# Patient Record
Sex: Female | Born: 1985 | Race: White | Hispanic: No | Marital: Single
Health system: Southern US, Community
[De-identification: ages and names within clinical notes are randomized; demographics above are authoritative.]

## PROBLEM LIST (undated history)

## (undated) DIAGNOSIS — K439 Ventral hernia without obstruction or gangrene: Secondary | ICD-10-CM

## (undated) DIAGNOSIS — F99 Mental disorder, not otherwise specified: Secondary | ICD-10-CM

## (undated) DIAGNOSIS — IMO0001 Reserved for inherently not codable concepts without codable children: Secondary | ICD-10-CM

## (undated) DIAGNOSIS — I10 Essential (primary) hypertension: Secondary | ICD-10-CM

## (undated) DIAGNOSIS — F32A Depression, unspecified: Secondary | ICD-10-CM

## (undated) DIAGNOSIS — E785 Hyperlipidemia, unspecified: Secondary | ICD-10-CM

## (undated) DIAGNOSIS — I739 Peripheral vascular disease, unspecified: Secondary | ICD-10-CM

## (undated) DIAGNOSIS — F319 Bipolar disorder, unspecified: Secondary | ICD-10-CM

## (undated) DIAGNOSIS — E119 Type 2 diabetes mellitus without complications: Secondary | ICD-10-CM

## (undated) DIAGNOSIS — F419 Anxiety disorder, unspecified: Secondary | ICD-10-CM

## (undated) DIAGNOSIS — F259 Schizoaffective disorder, unspecified: Secondary | ICD-10-CM

## (undated) DIAGNOSIS — C44509 Unspecified malignant neoplasm of skin of other part of trunk: Secondary | ICD-10-CM

## (undated) DIAGNOSIS — J45909 Unspecified asthma, uncomplicated: Secondary | ICD-10-CM

## (undated) DIAGNOSIS — E282 Polycystic ovarian syndrome: Secondary | ICD-10-CM

## (undated) DIAGNOSIS — T50902A Poisoning by unspecified drugs, medicaments and biological substances, intentional self-harm, initial encounter: Secondary | ICD-10-CM

## (undated) DIAGNOSIS — C499 Malignant neoplasm of connective and soft tissue, unspecified: Secondary | ICD-10-CM

## (undated) DIAGNOSIS — E669 Obesity, unspecified: Secondary | ICD-10-CM

## (undated) DIAGNOSIS — R569 Unspecified convulsions: Secondary | ICD-10-CM

## (undated) DIAGNOSIS — F209 Schizophrenia, unspecified: Secondary | ICD-10-CM

## (undated) DIAGNOSIS — E7800 Pure hypercholesterolemia, unspecified: Secondary | ICD-10-CM

## (undated) DIAGNOSIS — F329 Major depressive disorder, single episode, unspecified: Secondary | ICD-10-CM

## (undated) HISTORY — DX: Hyperlipidemia, unspecified: E78.5

## (undated) HISTORY — DX: Peripheral vascular disease, unspecified: I73.9

## (undated) HISTORY — DX: Essential (primary) hypertension: I10

## (undated) HISTORY — DX: Type 2 diabetes mellitus without complications: E11.9

## (undated) HISTORY — DX: Depression, unspecified: F32.A

## (undated) HISTORY — DX: Anxiety disorder, unspecified: F41.9

## (undated) HISTORY — DX: Schizoaffective disorder, unspecified: F25.9

## (undated) HISTORY — DX: Bipolar disorder, unspecified: F31.9

## (undated) HISTORY — PX: OTHER SURGICAL HISTORY: SHX169

## (undated) HISTORY — PX: CHOLECYSTECTOMY: SHX55

## (undated) HISTORY — PX: HERNIA REPAIR: SHX51

## (undated) HISTORY — PX: VARICOSE VEIN SURGERY: SHX832

---
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
## 2006-02-28 ENCOUNTER — Encounter: Payer: Self-pay | Admitting: Family Medicine

## 2006-02-28 ENCOUNTER — Ambulatory Visit: Payer: Self-pay | Admitting: Family Medicine

## 2006-04-09 ENCOUNTER — Telehealth: Payer: Self-pay | Admitting: *Deleted

## 2006-04-09 ENCOUNTER — Ambulatory Visit: Payer: Self-pay | Admitting: Family Medicine

## 2006-04-09 LAB — CONVERTED CEMR LAB

## 2006-04-20 ENCOUNTER — Ambulatory Visit: Payer: Self-pay | Admitting: Family Medicine

## 2006-04-20 ENCOUNTER — Encounter (INDEPENDENT_AMBULATORY_CARE_PROVIDER_SITE_OTHER): Payer: Self-pay | Admitting: Family Medicine

## 2006-04-20 DIAGNOSIS — L708 Other acne: Secondary | ICD-10-CM | POA: Insufficient documentation

## 2006-04-20 DIAGNOSIS — N912 Amenorrhea, unspecified: Secondary | ICD-10-CM | POA: Insufficient documentation

## 2006-04-20 DIAGNOSIS — R109 Unspecified abdominal pain: Secondary | ICD-10-CM

## 2006-04-20 LAB — CONVERTED CEMR LAB
TSH: 2.8 microintl units/mL (ref 0.350–5.50)
hCG, Beta Chain, Quant, S: <2 milliintl units/mL

## 2006-04-24 ENCOUNTER — Encounter: Admission: RE | Admit: 2006-04-24 | Discharge: 2006-04-24 | Payer: Self-pay | Admitting: Sports Medicine

## 2006-04-24 ENCOUNTER — Encounter (INDEPENDENT_AMBULATORY_CARE_PROVIDER_SITE_OTHER): Payer: Self-pay | Admitting: Family Medicine

## 2006-04-25 ENCOUNTER — Telehealth (INDEPENDENT_AMBULATORY_CARE_PROVIDER_SITE_OTHER): Payer: Self-pay | Admitting: *Deleted

## 2006-04-25 ENCOUNTER — Encounter (INDEPENDENT_AMBULATORY_CARE_PROVIDER_SITE_OTHER): Payer: Self-pay | Admitting: *Deleted

## 2006-04-25 ENCOUNTER — Encounter (INDEPENDENT_AMBULATORY_CARE_PROVIDER_SITE_OTHER): Payer: Self-pay | Admitting: Family Medicine

## 2006-04-25 DIAGNOSIS — E282 Polycystic ovarian syndrome: Secondary | ICD-10-CM

## 2006-04-26 ENCOUNTER — Ambulatory Visit: Payer: Self-pay | Admitting: Sports Medicine

## 2006-04-26 ENCOUNTER — Encounter (INDEPENDENT_AMBULATORY_CARE_PROVIDER_SITE_OTHER): Payer: Self-pay | Admitting: Family Medicine

## 2006-04-26 ENCOUNTER — Encounter: Payer: Self-pay | Admitting: *Deleted

## 2006-04-26 LAB — CONVERTED CEMR LAB
Glucose, Bld: 78 mg/dL (ref 70–99)
HDL: 27 mg/dL — ABNORMAL LOW (ref >39)

## 2006-04-27 ENCOUNTER — Telehealth (INDEPENDENT_AMBULATORY_CARE_PROVIDER_SITE_OTHER): Payer: Self-pay | Admitting: Family Medicine

## 2006-04-27 ENCOUNTER — Encounter (INDEPENDENT_AMBULATORY_CARE_PROVIDER_SITE_OTHER): Payer: Self-pay | Admitting: Family Medicine

## 2006-06-13 ENCOUNTER — Telehealth: Payer: Self-pay | Admitting: *Deleted

## 2006-06-13 ENCOUNTER — Ambulatory Visit: Payer: Self-pay | Admitting: Family Medicine

## 2006-06-13 ENCOUNTER — Encounter (INDEPENDENT_AMBULATORY_CARE_PROVIDER_SITE_OTHER): Payer: Self-pay | Admitting: Family Medicine

## 2006-06-13 DIAGNOSIS — N921 Excessive and frequent menstruation with irregular cycle: Secondary | ICD-10-CM

## 2006-06-13 DIAGNOSIS — R42 Dizziness and giddiness: Secondary | ICD-10-CM

## 2006-06-13 DIAGNOSIS — N949 Unspecified condition associated with female genital organs and menstrual cycle: Secondary | ICD-10-CM

## 2006-06-13 LAB — CONVERTED CEMR LAB
GC Probe Amp, Genital: NEGATIVE
Hemoglobin: 14.3 g/dL
MCV: 86 fL
Platelets: 264 10*3/uL
Prolactin: 10.9 ng/mL
Testosterone: 55.8 ng/dL — ABNORMAL HIGH (ref 15–40)
WBC: 9.7 10*3/uL
Whiff Test: NEGATIVE
hCG, Beta Chain, Quant, S: <2 milliintl units/mL

## 2006-06-15 ENCOUNTER — Encounter: Admission: RE | Admit: 2006-06-15 | Discharge: 2006-06-15 | Payer: Self-pay | Admitting: Sports Medicine

## 2006-06-19 ENCOUNTER — Telehealth (INDEPENDENT_AMBULATORY_CARE_PROVIDER_SITE_OTHER): Payer: Self-pay | Admitting: Family Medicine

## 2006-08-16 ENCOUNTER — Encounter: Payer: Self-pay | Admitting: Family Medicine

## 2006-10-01 ENCOUNTER — Encounter: Payer: Self-pay | Admitting: Family Medicine

## 2006-11-21 ENCOUNTER — Encounter: Payer: Self-pay | Admitting: Family Medicine

## 2006-11-21 ENCOUNTER — Ambulatory Visit: Payer: Self-pay | Admitting: Family Medicine

## 2006-11-21 DIAGNOSIS — R030 Elevated blood-pressure reading, without diagnosis of hypertension: Secondary | ICD-10-CM | POA: Insufficient documentation

## 2006-11-21 DIAGNOSIS — R5381 Other malaise: Secondary | ICD-10-CM | POA: Insufficient documentation

## 2006-11-21 DIAGNOSIS — R5383 Other fatigue: Secondary | ICD-10-CM

## 2006-11-21 LAB — CONVERTED CEMR LAB
AST: 39 units/L — ABNORMAL HIGH (ref 0–37)
Albumin: 4.1 g/dL (ref 3.5–5.2)
Alkaline Phosphatase: 81 units/L (ref 39–117)
BUN: 9 mg/dL (ref 6–23)
Basophils Relative: 0 % (ref 0–1)
Calcium: 9 mg/dL (ref 8.4–10.5)
Chloride: 103 meq/L (ref 96–112)
Creatinine, Ser: 0.61 mg/dL (ref 0.40–1.20)
Eosinophils Absolute: 0.2 10*3/uL (ref 0.0–0.7)
Free T4: 0.92 ng/dL (ref 0.89–1.80)
Glucose, Bld: 82 mg/dL (ref 70–99)
Glucose, Urine, Semiquant: NEGATIVE
Hemoglobin: 13.4 g/dL (ref 12.0–15.0)
Lymphs Abs: 2.8 10*3/uL (ref 0.7–3.3)
MCHC: 32.2 g/dL (ref 30.0–36.0)
MCV: 86.5 fL (ref 78.0–100.0)
Monocytes Absolute: 0.5 10*3/uL (ref 0.2–0.7)
Monocytes Relative: 5 % (ref 3–11)
RBC: 4.81 M/uL (ref 3.87–5.11)
Specific Gravity, Urine: 1.025
WBC: 9.9 10*3/uL (ref 4.0–10.5)
pH: 6

## 2006-11-26 ENCOUNTER — Telehealth: Payer: Self-pay | Admitting: Family Medicine

## 2006-12-10 ENCOUNTER — Encounter (INDEPENDENT_AMBULATORY_CARE_PROVIDER_SITE_OTHER): Payer: Self-pay | Admitting: *Deleted

## 2006-12-17 ENCOUNTER — Ambulatory Visit: Payer: Self-pay | Admitting: Family Medicine

## 2006-12-17 ENCOUNTER — Encounter: Payer: Self-pay | Admitting: Family Medicine

## 2006-12-24 ENCOUNTER — Telehealth: Payer: Self-pay | Admitting: Family Medicine

## 2007-01-10 ENCOUNTER — Encounter (INDEPENDENT_AMBULATORY_CARE_PROVIDER_SITE_OTHER): Payer: Self-pay | Admitting: *Deleted

## 2007-01-10 ENCOUNTER — Ambulatory Visit: Payer: Self-pay | Admitting: Family Medicine

## 2007-07-30 ENCOUNTER — Ambulatory Visit: Payer: Self-pay | Admitting: Family Medicine

## 2007-07-30 DIAGNOSIS — E669 Obesity, unspecified: Secondary | ICD-10-CM | POA: Insufficient documentation

## 2007-07-30 DIAGNOSIS — F251 Schizoaffective disorder, depressive type: Secondary | ICD-10-CM

## 2007-07-30 DIAGNOSIS — G479 Sleep disorder, unspecified: Secondary | ICD-10-CM | POA: Insufficient documentation

## 2007-07-30 DIAGNOSIS — R7301 Impaired fasting glucose: Secondary | ICD-10-CM | POA: Insufficient documentation

## 2007-07-30 DIAGNOSIS — E66812 Obesity, class 2: Secondary | ICD-10-CM | POA: Insufficient documentation

## 2007-08-02 ENCOUNTER — Ambulatory Visit (HOSPITAL_BASED_OUTPATIENT_CLINIC_OR_DEPARTMENT_OTHER): Admission: RE | Admit: 2007-08-02 | Discharge: 2007-08-02 | Payer: Self-pay | Admitting: Family Medicine

## 2007-08-02 ENCOUNTER — Encounter: Payer: Self-pay | Admitting: Family Medicine

## 2007-08-06 ENCOUNTER — Telehealth: Payer: Self-pay | Admitting: *Deleted

## 2007-08-10 ENCOUNTER — Ambulatory Visit: Payer: Self-pay | Admitting: Internal Medicine

## 2007-08-19 ENCOUNTER — Telehealth: Payer: Self-pay | Admitting: *Deleted

## 2007-09-23 ENCOUNTER — Encounter: Payer: Self-pay | Admitting: Family Medicine

## 2007-09-23 ENCOUNTER — Ambulatory Visit: Payer: Self-pay | Admitting: Family Medicine

## 2007-09-23 DIAGNOSIS — F909 Attention-deficit hyperactivity disorder, unspecified type: Secondary | ICD-10-CM | POA: Insufficient documentation

## 2007-09-23 DIAGNOSIS — R1013 Epigastric pain: Secondary | ICD-10-CM

## 2007-09-23 LAB — CONVERTED CEMR LAB
ALT: 29 units/L (ref 0–35)
AST: 50 units/L — ABNORMAL HIGH (ref 0–37)
Albumin: 4.4 g/dL (ref 3.5–5.2)
Alkaline Phosphatase: 99 units/L (ref 39–117)
BUN: 8 mg/dL (ref 6–23)
Calcium: 9.6 mg/dL (ref 8.4–10.5)
Chloride: 104 meq/L (ref 96–112)
H Pylori IgG: NEGATIVE
Hemoglobin: 13.5 g/dL (ref 12.0–15.0)
Hgb A1c MFr Bld: 5.7 %
MCHC: 32.7 g/dL (ref 30.0–36.0)
Potassium: 4.2 meq/L (ref 3.5–5.3)
RDW: 14.2 % (ref 11.5–15.5)
Sodium: 141 meq/L (ref 135–145)
Total Protein: 7.5 g/dL (ref 6.0–8.3)

## 2007-09-24 ENCOUNTER — Encounter: Payer: Self-pay | Admitting: Family Medicine

## 2007-10-16 ENCOUNTER — Telehealth: Payer: Self-pay | Admitting: *Deleted

## 2007-10-16 ENCOUNTER — Ambulatory Visit: Payer: Self-pay | Admitting: Family Medicine

## 2007-10-16 LAB — CONVERTED CEMR LAB
Beta hcg, urine, semiquantitative: NEGATIVE
Ketones, urine, test strip: NEGATIVE
Nitrite: NEGATIVE
Urobilinogen, UA: 0.2
WBC Urine, dipstick: NEGATIVE

## 2007-10-17 ENCOUNTER — Emergency Department (HOSPITAL_COMMUNITY): Admission: EM | Admit: 2007-10-17 | Discharge: 2007-10-18 | Payer: Self-pay | Admitting: Emergency Medicine

## 2007-10-17 ENCOUNTER — Telehealth: Payer: Self-pay | Admitting: *Deleted

## 2007-10-17 ENCOUNTER — Telehealth (INDEPENDENT_AMBULATORY_CARE_PROVIDER_SITE_OTHER): Payer: Self-pay | Admitting: Family Medicine

## 2007-10-18 ENCOUNTER — Telehealth: Payer: Self-pay | Admitting: *Deleted

## 2007-10-22 ENCOUNTER — Encounter: Payer: Self-pay | Admitting: Family Medicine

## 2007-10-22 ENCOUNTER — Encounter: Payer: Self-pay | Admitting: *Deleted

## 2007-10-30 ENCOUNTER — Telehealth (INDEPENDENT_AMBULATORY_CARE_PROVIDER_SITE_OTHER): Payer: Self-pay | Admitting: *Deleted

## 2007-10-30 ENCOUNTER — Telehealth: Payer: Self-pay | Admitting: *Deleted

## 2008-01-08 ENCOUNTER — Encounter: Payer: Self-pay | Admitting: Family Medicine

## 2010-02-14 ENCOUNTER — Encounter: Payer: Self-pay | Admitting: Emergency Medicine

## 2010-06-07 NOTE — Procedures (Signed)
NAME:  Tina Saunders, Tina Saunders                ACCOUNT NO.:  [id number]   MEDICAL RECORD NO.:  [id number]          PATIENT TYPE:  OUT   LOCATION:  SLEEP CENTER                 FACILITY:  [facility]   PHYSICIAN:  Clinton D. Maple Hudson, MD, FCCP, FACPDATE OF BIRTH:  04/21/1986   DATE OF STUDY:  08/02/2007                            NOCTURNAL POLYSOMNOGRAM   REFERRING PHYSICIAN:   REFERRING PHYSICIAN:  Ancil Boozer, M.D.   INDICATION FOR STUDY:  Insomnia with sleep apnea.   EPWORTH SLEEPINESS SCORE:  14/24.  BMI 42.1.  Weight 230 pounds.  Height  62 inches.  Neck 16 inches.   HOME MEDICATIONS:  Charted and reviewed.   SLEEP ARCHITECTURE:  Total sleep time 231.5 minutes with sleep  efficiency 60.5%.  Stage 1 was 14.5%.  Stage II 73.7%.  Stage III 11.9%.  REM absent.  Sleep latency 85 minutes.  REM latency N/A.  Awake after  sleep onset 63.5 minutes.  Arousal index 31.6.  Bedtime medication  included Metformin and Permetrim.   RESPIRATORY DATA:  Apnea-hypopnea index (AHI) 6.5 per hour.  There were  25 events scored, including 19 central apneas and 6 hypopneas.   OXYGEN DATA:  Moderately loud snoring with oxygen desaturation to a  nadir of 86%.  Mean oxygen saturation through the study was 95.8% on  room air.   CARDIAC DATA:  Normal sinus rhythm.   MOVEMENT-PARASOMNIA:  No significant movement disturbance.  Bathroom x1.   IMPRESSIONS-RECOMMENDATIONS:  1. Sleep architecture marked by delay in sleep onset, latency 85      minutes, with frequent subsequent awakenings.  2. Mild central and obstructive sleep apnea, mainly central events,      apnea-hypopnea index 6.5 per hour.  Events were all associated with      supine sleep position.  3. This pattern is not entirely clear because snoring was associated      with the events, but obvious obstruction was      not seen.  A therapeutic trial of CPAP could be considered but      conservative therapy may be appropriate, including weight loss, and  encouragement to sleep on flat of back.      Clinton D. Maple Hudson, MD, [facility], FACP  Diplomate, Biomedical engineer of Sleep Medicine  Electronically Signed     CDY/MEDQ  D:  08/10/2007 09:51:28  T:  08/10/2007 10:35:59  Job:  [id number]

## 2010-06-10 NOTE — Op Note (Signed)
NAMELARONICA, BHAGAT                            ACCOUNT NO.:  [id number]   MEDICAL RECORD NO.:  [id number]                   PATIENT TYPE:  OIB   LOCATION:                                       FACILITY:  [facility]   PHYSICIAN:  Quita Skye. Hart Rochester, M.D.               DATE OF BIRTH:  09-May-1985   DATE OF PROCEDURE:  03/28/2002  DATE OF DISCHARGE:                                 OPERATIVE REPORT   PREOPERATIVE DIAGNOSIS:  Severe varicose veins, greater saphenous system,  left leg.   POSTOPERATIVE DIAGNOSIS:  Severe varicose veins, greater saphenous system,  left leg.   OPERATION:  1. Ligation and stripping of greater saphenous vein, left leg.  2. Excision of multiple varicosities, left leg.   SURGEON:  Quita Skye. Hart Rochester, M.D.   FIRST ASSISTANT:  Lissa Merlin, P.A.   ANESTHESIA:  General endotracheal.   DESCRIPTION OF PROCEDURE:  The patient was taken to the operating room,  placed in the upright position where the varicosities of the left leg were  marked.  These involved the greater saphenous system beginning in the distal  thigh and extending into the calf, both anteriorly and posteriorly.  After  induction of satisfactory general endotracheal anesthesia, the left leg was  prepped with Betadine solution and draped in a routine sterile manner.  Incision was made in the inguinal region obliquely, carried down to the  saphenofemoral junction.  Saphenofemoral junction dissected free.  All  branches ligated with 3-0 silk ties and divided.  Saphenous vein was ligated  at the saphenofemoral junction with a 2-0 tie, opened transversely, and  intraluminal stripper was passed distally to the proximal calf where it was  palpated.  A short transverse incision was made, and the vein ligated  distally, transected, and a small stripper head secured in the groin area.  Short stab wound incisions were then made in transverse fashion over the  marked varicosities, and all of the varicosities were  removed by either  dissection or avulsion techniques.  These extended both anteriorly and  posteriorly into the calf and pretibial region, requiring about 10  incisions.  When this was completed, the leg was wrapped, and the patient  was placed in Trendelenburg position.  The greater saphenous veins stripped  from proximal to distal.  Compression applied for 10 minutes.  The wounds  were then all closed with Vicryl in subcuticular fashion with Steri-Strips.  Sterile dressing applied consisting of 4 x 4's, Webril, and Ace wrap.  The  patient was taken to the recovery room in satisfactory condition.                                               Quita Skye Hart Rochester, M.D.    JDL/MEDQ  D:  03/28/2002  T:  03/28/2002  Job:  [id number]

## 2010-10-24 LAB — URINALYSIS, ROUTINE W REFLEX MICROSCOPIC
Bilirubin Urine: NEGATIVE
Hgb urine dipstick: NEGATIVE
Specific Gravity, Urine: 1.012
Urobilinogen, UA: 1
pH: 7

## 2010-10-24 LAB — WET PREP, GENITAL
Trich, Wet Prep: NONE SEEN
Yeast Wet Prep HPF POC: NONE SEEN

## 2010-10-24 LAB — PREGNANCY, URINE: Preg Test, Ur: NEGATIVE

## 2011-06-29 ENCOUNTER — Encounter (HOSPITAL_COMMUNITY): Payer: Self-pay | Admitting: Emergency Medicine

## 2011-06-29 ENCOUNTER — Emergency Department (HOSPITAL_COMMUNITY)
Admission: EM | Admit: 2011-06-29 | Discharge: 2011-06-30 | Disposition: A | Discharge status: Home / Self Care | Payer: Self-pay | Attending: Emergency Medicine | Admitting: Emergency Medicine

## 2011-06-29 DIAGNOSIS — R112 Nausea with vomiting, unspecified: Secondary | ICD-10-CM | POA: Insufficient documentation

## 2011-06-29 DIAGNOSIS — R197 Diarrhea, unspecified: Secondary | ICD-10-CM | POA: Insufficient documentation

## 2011-06-29 DIAGNOSIS — Z85028 Personal history of other malignant neoplasm of stomach: Secondary | ICD-10-CM | POA: Insufficient documentation

## 2011-06-29 HISTORY — DX: Unspecified malignant neoplasm of skin of other part of trunk: C44.509

## 2011-06-29 MED ORDER — SODIUM CHLORIDE 0.9 % IV BOLUS (SEPSIS)
1000.0000 mL | Freq: Once | INTRAVENOUS | Status: AC
  Administered 2011-06-30: 1000 mL via INTRAVENOUS

## 2011-06-29 MED ORDER — ONDANSETRON HCL 4 MG/2ML IJ SOLN
4.0000 mg | Freq: Once | INTRAMUSCULAR | Status: AC
  Administered 2011-06-30: 4 mg via INTRAVENOUS
  Filled 2011-06-29: qty 2

## 2011-06-29 MED ORDER — MORPHINE SULFATE 4 MG/ML IJ SOLN
4.0000 mg | Freq: Once | INTRAMUSCULAR | Status: AC
  Administered 2011-06-30: 4 mg via INTRAVENOUS
  Filled 2011-06-29: qty 1

## 2011-06-29 NOTE — ED Provider Notes (Signed)
History     CSN: [id number]  Arrival date & time 06/29/11  2331   First MD Initiated Contact with Patient 06/29/11 2341      Chief Complaint  Patient presents with  . 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 Vomiting is nonbloody and nonbilious. Diarrhea is watery. No bloody or black tarry stools. Patient has been taking Zofran at home without relief. She has had epigastric abdominal pain since onset of symptoms. Pain is described as a squeezing. Patient denies fevers or dysuria. She states that she has not been urinating as much is normal. She states that she is unable to keep down any fluids or solids. She has not been seen for these symptoms in [place].   Patient is a 26 y.o. female presenting with abdominal pain. The history is provided by the patient.  Abdominal Pain The primary symptoms of the illness include abdominal pain, nausea, vomiting and diarrhea. The primary symptoms of the illness do not include fever, shortness of breath, hematemesis or dysuria. The current episode started yesterday. The onset of the illness was gradual. The problem has not changed since onset.   Past Medical History  Diagnosis Date  . Cancer of abdominal wall     Past Surgical History  Procedure Date  . Cholecystectomy     No family history on file.  History  Substance Use Topics  . Smoking status: Not on file  . Smokeless tobacco: Not on file  . Alcohol Use:     OB History    Grav Para Term Preterm Abortions TAB SAB Ect Mult Living                  Review of Systems  Constitutional: Negative for fever.  HENT: Negative for sore throat and rhinorrhea.   Eyes: Negative for  redness.  Respiratory: Negative for cough and shortness of breath.   Cardiovascular: Negative for chest pain.  Gastrointestinal: Positive for nausea, vomiting, abdominal pain and diarrhea. Negative for hematemesis.  Genitourinary: Positive for decreased urine volume. Negative for dysuria.  Musculoskeletal: Negative for myalgias.  Skin: Negative for rash.  Neurological: Negative for headaches.    Allergies  Review of patient's allergies indicates no known allergies.  Home Medications  No current outpatient prescriptions on file.  BP 149/98  Pulse 92  Temp(Src) 99 F (37.2 C) (Oral)  Resp 20  SpO2 98%  LMP 06/21/2011  Physical Exam  Nursing note and vitals reviewed. Constitutional: She appears well-developed and well-nourished.  HENT:  Head: Normocephalic and atraumatic.  Eyes: Conjunctivae are normal. Right eye exhibits no discharge. Left eye exhibits no discharge.  Neck: Normal range of motion. Neck supple.  Cardiovascular: Normal rate, regular rhythm and normal heart sounds.   Pulmonary/Chest: Effort normal and breath sounds normal.  Abdominal: Soft. There is tenderness in the epigastric area and left upper quadrant. There is no rigidity, no rebound, no guarding, no tenderness at McBurney's point and negative Murphy's sign.    Neurological: She is alert.  Skin: Skin is warm and dry.  Psychiatric: She has a normal mood and affect.    ED Course  Procedures (including critical care time)  Labs Reviewed  URINALYSIS, ROUTINE W REFLEX MICROSCOPIC - Abnormal; Notable for the following:    APPearance CLOUDY (*)  Hgb urine dipstick TRACE (*)    Leukocytes, UA MODERATE (*)    All other components within normal limits  CBC - Abnormal; Notable for the following:    WBC 10.7 (*)    All other components within normal limits  COMPREHENSIVE METABOLIC PANEL - Abnormal; Notable for the following:    Glucose, Bld 123 (*)    Albumin 3.4 (*)    AST 38 (*)    Total Bilirubin 0.1  (*)    All other components within normal limits  URINE MICROSCOPIC-ADD ON - Abnormal; Notable for the following:    Squamous Epithelial / LPF MANY (*)    Bacteria, UA FEW (*)    All other components within normal limits  PREGNANCY, URINE  DIFFERENTIAL  LIPASE, BLOOD   No results found.   1. Nausea vomiting and diarrhea   2. Abdominal pain     11:52 PM Patient seen and examined. Work-up initiated. Medications ordered.   Vital signs reviewed and are as follows: Filed Vitals:   06/29/11 2333  BP: 149/98  Pulse: 92  Temp: 99 F (37.2 C)  Resp: 20   1:19 AM Lab work-up is unconcerning. Abd exam remains benign. Will give PO challenge. Handoff to Dr. Patria Mane who will dispo appropriately after PO challenge.    MDM  Abd pain, N/V/D -- patient does not appear clinically dehydrated and labs support this. Work-up without any concerning findings. Abd soft. Do not suspect obstruction given rectal output. PO challenge pending.         Renne Crigler, [place] 06/30/11 [phone]

## 2011-06-29 NOTE — ED Notes (Signed)
PT. REPORTS EMESIS WITH DIARRHEA ND MID ABDOMINAL PAIN ONSET YESTERDAY , NO FEVER OR CHILLS.

## 2011-06-30 LAB — COMPREHENSIVE METABOLIC PANEL
BUN: 9 mg/dL (ref 6–23)
Calcium: 9.2 mg/dL (ref 8.4–10.5)
Creatinine, Ser: 0.52 mg/dL (ref 0.50–1.10)
GFR calc Af Amer: >90 mL/min (ref >90)
Glucose, Bld: 123 mg/dL — ABNORMAL HIGH (ref 70–99)
Total Protein: 7.3 g/dL (ref 6.0–8.3)

## 2011-06-30 LAB — URINALYSIS, ROUTINE W REFLEX MICROSCOPIC
Bilirubin Urine: NEGATIVE
Glucose, UA: NEGATIVE mg/dL
Ketones, ur: NEGATIVE mg/dL
Nitrite: NEGATIVE
Protein, ur: NEGATIVE mg/dL
pH: 5.5 (ref 5.0–8.0)

## 2011-06-30 LAB — URINE MICROSCOPIC-ADD ON

## 2011-06-30 LAB — CBC
Hemoglobin: 13.2 g/dL (ref 12.0–15.0)
MCH: 28.8 pg (ref 26.0–34.0)
MCHC: 34 g/dL (ref 30.0–36.0)
MCV: 84.5 fL (ref 78.0–100.0)
RBC: 4.59 MIL/uL (ref 3.87–5.11)

## 2011-06-30 LAB — DIFFERENTIAL
Eosinophils Absolute: 0.5 10*3/uL (ref 0.0–0.7)
Eosinophils Relative: 5 % (ref 0–5)
Lymphs Abs: 2.5 10*3/uL (ref 0.7–4.0)
Monocytes Relative: 5 % (ref 3–12)

## 2011-06-30 LAB — LIPASE, BLOOD: Lipase: 37 U/L (ref 11–59)

## 2011-06-30 MED ORDER — PROMETHAZINE HCL 25 MG/ML IJ SOLN
12.5000 mg | Freq: Once | INTRAMUSCULAR | Status: DC
  Filled 2011-06-30: qty 1

## 2011-06-30 MED ORDER — PROMETHAZINE HCL 25 MG RE SUPP: 25.0000 mg | Freq: Four times a day (QID) | RECTAL | Status: DC | PRN

## 2011-06-30 NOTE — ED Notes (Signed)
Pharmacy notified re: [email or web address]

## 2011-06-30 NOTE — ED Provider Notes (Signed)
Medical screening examination/treatment/procedure(s) were conducted as a shared visit with non-physician practitioner(s) and myself.  I personally evaluated the patient during the encounter  The patient is feeling much better at this time.  She requests to orally challenged herself at home.  Her abdomen is benign on exam.  Labs otherwise normal.  I suspect this is viral nausea vomiting diarrhea  1. Nausea vomiting and diarrhea    Results for orders placed during the hospital encounter of 06/29/11  URINALYSIS, ROUTINE W REFLEX MICROSCOPIC      Component Value Range   Color, Urine YELLOW  YELLOW    APPearance CLOUDY (*) CLEAR    Specific Gravity, Urine 1.023  1.005 - 1.030    pH 5.5  5.0 - 8.0    Glucose, UA NEGATIVE  NEGATIVE (mg/dL)   Hgb urine dipstick TRACE (*) NEGATIVE    Bilirubin Urine NEGATIVE  NEGATIVE    Ketones, ur NEGATIVE  NEGATIVE (mg/dL)   Protein, ur NEGATIVE  NEGATIVE (mg/dL)   Urobilinogen, UA 0.2  0.0 - 1.0 (mg/dL)   Nitrite NEGATIVE  NEGATIVE    Leukocytes, UA MODERATE (*) NEGATIVE   PREGNANCY, URINE      Component Value Range   Preg Test, Ur NEGATIVE  NEGATIVE   CBC      Component Value Range   WBC 10.7 (*) 4.0 - 10.5 (K/uL)   RBC 4.59  3.87 - 5.11 (MIL/uL)   Hemoglobin 13.2  12.0 - 15.0 (g/dL)   HCT 16.1  09.6 - 04.5 (%)   MCV 84.5  78.0 - 100.0 (fL)   MCH 28.8  26.0 - 34.0 (pg)   MCHC 34.0  30.0 - 36.0 (g/dL)   RDW 40.9  81.1 - 91.4 (%)   Platelets 253  150 - 400 (K/uL)  DIFFERENTIAL      Component Value Range   Neutrophils Relative 66  43 - 77 (%)   Neutro Abs 7.1  1.7 - 7.7 (K/uL)   Lymphocytes Relative 24  12 - 46 (%)   Lymphs Abs 2.5  0.7 - 4.0 (K/uL)   Monocytes Relative 5  3 - 12 (%)   Monocytes Absolute 0.6  0.1 - 1.0 (K/uL)   Eosinophils Relative 5  0 - 5 (%)   Eosinophils Absolute 0.5  0.0 - 0.7 (K/uL)   Basophils Relative 0  0 - 1 (%)   Basophils Absolute 0.0  0.0 - 0.1 (K/uL)  COMPREHENSIVE METABOLIC PANEL      Component Value Range   Sodium 139  135 - 145 (mEq/L)   Potassium 3.9  3.5 - 5.1 (mEq/L)   Chloride 106  96 - 112 (mEq/L)   CO2 21  19 - 32 (mEq/L)   Glucose, Bld 123 (*) 70 - 99 (mg/dL)   BUN 9  6 - 23 (mg/dL)   Creatinine, Ser 7.82  0.50 - 1.10 (mg/dL)   Calcium 9.2  8.4 - 95.6 (mg/dL)   Total Protein 7.3  6.0 - 8.3 (g/dL)   Albumin 3.4 (*) 3.5 - 5.2 (g/dL)   AST 38 (*) 0 - 37 (U/L)   ALT 25  0 - 35 (U/L)   Alkaline Phosphatase 91  39 - 117 (U/L)   Total Bilirubin 0.1 (*) 0.3 - 1.2 (mg/dL)   GFR calc non Af Amer >90  >90 (mL/min)   GFR calc Af Amer >90  >90 (mL/min)  LIPASE, BLOOD      Component Value Range   Lipase 37  11 - 59 (  U/L)  URINE MICROSCOPIC-ADD ON      Component Value Range   Squamous Epithelial / LPF MANY (*) RARE    WBC, UA 3-6  <3 (WBC/hpf)   RBC / HPF 0-2  <3 (RBC/hpf)   Bacteria, UA FEW (*) RARE       Lyanne Co, MD 06/30/11 [phone]

## 2011-06-30 NOTE — ED Notes (Addendum)
POST FALL NOTE: patient's sister went into room to visit. came out and informed primary RN that patient was on the floor. RN to room. patient sitting up with legs in "[place] style" position at foot of bed. both side rails were up and call bell in the bed. RN asked patient what happened and patient stated "I FELL." patient could not offer any reason to why this had occurred. no obvious deformities or injuries noted. patient denied any pain or discomfort. assisted patient up into bed. side rails up x 2. call bell within reach. requested that patient not get up without calling for staff assistance. patient verbalized understanding and agreed with plan of care re: safety. Discharge pending. Will continue to monitor.

## 2011-06-30 NOTE — Discharge Instructions (Signed)
Please read and follow all provided instructions.  Your diagnoses today include:  1. Nausea vomiting and diarrhea   2. Abdominal pain     Tests performed today include:  Blood counts and electrolytes  Blood tests to check liver and kidney function  Blood tests to check pancreas function  Urine test to look for infection and pregnancy (in women)  Vital signs. See below for your results today.   Medications prescribed:  Take any prescribed medications only as directed.  Home care instructions:   Follow any educational materials contained in this packet.  Follow-up instructions: Please follow-up with your primary care provider in the next 2 days for further evaluation of your symptoms. If you do not have a primary care doctor -- see below for referral information.   Return instructions:  SEEK IMMEDIATE MEDICAL ATTENTION IF:  The pain does not go away or becomes severe   A temperature above 101F develops   Repeated vomiting occurs (multiple episodes)   The pain becomes localized to portions of the abdomen. The right side could possibly be appendicitis. In an adult, the left lower portion of the abdomen could be colitis or diverticulitis.   Blood is being passed in stools or vomit (bright red or black tarry stools)   You develop chest pain, difficulty breathing, dizziness or fainting, or become confused, poorly responsive, or inconsolable (young children)  If you have any other emergent concerns regarding your health  Additional Information: Abdominal (belly) pain can be caused by many things. Your caregiver performed an examination and possibly ordered blood/urine tests and imaging (CT scan, x-rays, ultrasound). Many cases can be observed and treated at home after initial evaluation in the emergency department. Even though you are being discharged home, abdominal pain can be unpredictable. Therefore, you need a repeated exam if your pain does not resolve, returns, or  worsens. Most patients with abdominal pain don't have to be admitted to the hospital or have surgery, but serious problems like appendicitis and gallbladder attacks can start out as nonspecific pain. Many abdominal conditions cannot be diagnosed in one visit, so follow-up evaluations are very important.  Your vital signs today were: BP 149/98  Pulse 92  Temp(Src) 99 F (37.2 C) (Oral)  Resp 20  SpO2 98%  LMP 06/21/2011 If your blood pressure (bp) was elevated above 135/85 this visit, please have this repeated by your doctor within one month. -------------- No Primary Care Doctor Call [facility]  [phone] Other agencies that provide inexpensive medical care    [facility]  [phone]    [facility]  [phone]    [facility]  [phone]    [facility]  [phone]    [facility]  [phone]    [facility]  [phone] -------------- RESOURCE GUIDE:  Dental Problems  Patients with Medicaid: [facility] [phone] [address]                                            [phone] [address] Phone:  [phone]  Phone:  [phone]  If unable to pay or uninsured, contact:  [facility] or [facility]. to become qualified for the adult dental clinic.  Chronic Pain Problems Contact [facility]  [phone] Patients need to be referred by their primary care doctor.  Insufficient Money for Medicine Contact [employer]:  call "211" or [facility] [phone].  Psychological Services [facility]  [phone] [facility]  [phone] [facility]   [phone] (emergency services [phone])  Substance Abuse Resources [facility]  [phone] [facility] [phone] [facility] [phone] Floydene Flock [phone] Residential &  Outpatient Substance Abuse Program  [phone]  Abuse/Neglect [facility] Child Abuse Hotline 240-634-1081 [facility] Child Abuse Hotline [phone] (After Hours)  Emergency Shelter [facility] 9154402924  Maternity Homes Room at the [place] [facility] [phone] [facility] [phone]  [facility] Resources  [facility] [place]     [employer]                          [facility] Dept. [address] [place]                       [address]      [address]  Blondell Reveal Phone:  [phone]                                   Phone:  [phone]                 Phone:  [phone]  [facility] Phone:  [phone]  [facility] Child Abuse Hotline [phone] (After Hours)  Nausea and Vomiting Nausea is a sick feeling that often comes before throwing up (vomiting). Vomiting is a reflex where stomach contents come out of your mouth. Vomiting can cause severe loss of body fluids (dehydration). Children and elderly adults can become dehydrated quickly, especially if they also have diarrhea. Nausea and vomiting are symptoms of a condition or disease. It is important to find the cause of your symptoms. CAUSES   Direct irritation of the stomach lining. This irritation can result from increased acid production (gastroesophageal reflux disease), infection, food poisoning, taking certain medicines (such as nonsteroidal anti-inflammatory drugs), alcohol use, or tobacco use.   Signals from the brain.These signals could be caused by a headache, heat exposure, an inner ear disturbance, increased pressure in the brain from injury, infection, a tumor, or a concussion, pain, emotional stimulus, or metabolic problems.   An obstruction in the gastrointestinal tract (bowel  obstruction).   Illnesses such as diabetes, hepatitis, gallbladder problems, appendicitis, kidney problems, cancer, sepsis, atypical symptoms of a heart attack, or eating disorders.   Medical treatments such as chemotherapy and radiation.   Receiving medicine that makes you sleep (general anesthetic)  during surgery.  DIAGNOSIS Your caregiver may ask for tests to be done if the problems do not improve after a few days. Tests may also be done if symptoms are severe or if the reason for the nausea and vomiting is not clear. Tests may include:  Urine tests.   Blood tests.   Stool tests.   Cultures (to look for evidence of infection).   X-rays or other imaging studies.  Test results can help your caregiver make decisions about treatment or the need for additional tests. TREATMENT You need to stay well hydrated. Drink frequently but in small amounts.You may wish to drink water, sports drinks, clear broth, or eat frozen ice pops or gelatin dessert to help stay hydrated.When you eat, eating slowly may help prevent nausea.There are also some antinausea medicines that may help prevent nausea. HOME CARE INSTRUCTIONS   Take all medicine as directed by your caregiver.   If you do not have an appetite, do not force yourself to eat. However, you must continue to drink fluids.   If you have an appetite, eat a normal diet unless your caregiver tells you differently.   Eat a variety of complex carbohydrates (rice, wheat, potatoes, bread), lean meats, yogurt, fruits, and vegetables.   Avoid high-fat foods because they are more difficult to digest.   Drink enough water and fluids to keep your urine clear or pale yellow.   If you are dehydrated, ask your caregiver for specific rehydration instructions. Signs of dehydration may include:   Severe thirst.   Dry lips and mouth.   Dizziness.   Dark urine.   Decreasing urine frequency and amount.   Confusion.   Rapid breathing or pulse.    SEEK IMMEDIATE MEDICAL CARE IF:   You have blood or brown flecks (like coffee grounds) in your vomit.   You have black or bloody stools.   You have a severe headache or stiff neck.   You are confused.   You have severe abdominal pain.   You have chest pain or trouble breathing.   You do not urinate at least once every 8 hours.   You develop cold or clammy skin.   You continue to vomit for longer than 24 to 48 hours.   You have a fever.  MAKE SURE YOU:   Understand these instructions.   Will watch your condition.   Will get help right away if you are not doing well or get worse.  Document Released: 01/09/2005 Document Revised: 12/29/2010 Document Reviewed: 06/08/2010 [facility] Patient Information 2012 [place], [place].

## 2011-07-01 ENCOUNTER — Emergency Department (HOSPITAL_COMMUNITY)
Admission: EM | Admit: 2011-07-01 | Discharge: 2011-07-01 | Disposition: A | Discharge status: Home / Self Care | Payer: Self-pay | Attending: Emergency Medicine | Admitting: Emergency Medicine

## 2011-07-01 ENCOUNTER — Encounter (HOSPITAL_COMMUNITY): Payer: Self-pay

## 2011-07-01 ENCOUNTER — Emergency Department (HOSPITAL_COMMUNITY): Payer: Self-pay

## 2011-07-01 DIAGNOSIS — E282 Polycystic ovarian syndrome: Secondary | ICD-10-CM

## 2011-07-01 DIAGNOSIS — R0602 Shortness of breath: Secondary | ICD-10-CM | POA: Insufficient documentation

## 2011-07-01 DIAGNOSIS — R112 Nausea with vomiting, unspecified: Secondary | ICD-10-CM | POA: Insufficient documentation

## 2011-07-01 DIAGNOSIS — R197 Diarrhea, unspecified: Secondary | ICD-10-CM | POA: Insufficient documentation

## 2011-07-01 HISTORY — DX: Polycystic ovarian syndrome: E28.2

## 2011-07-01 LAB — URINALYSIS, ROUTINE W REFLEX MICROSCOPIC
Glucose, UA: NEGATIVE mg/dL
Hgb urine dipstick: NEGATIVE
Protein, ur: NEGATIVE mg/dL
Specific Gravity, Urine: 1.024 (ref 1.005–1.030)
pH: 5.5 (ref 5.0–8.0)

## 2011-07-01 LAB — CBC
HCT: 40.2 % (ref 36.0–46.0)
MCH: 28.3 pg (ref 26.0–34.0)
MCHC: 33.6 g/dL (ref 30.0–36.0)
RDW: 13.6 % (ref 11.5–15.5)

## 2011-07-01 LAB — DIFFERENTIAL
Basophils Absolute: 0 10*3/uL (ref 0.0–0.1)
Basophils Relative: 0 % (ref 0–1)
Eosinophils Absolute: 0.6 10*3/uL (ref 0.0–0.7)
Monocytes Absolute: 0.7 10*3/uL (ref 0.1–1.0)
Monocytes Relative: 6 % (ref 3–12)
Neutro Abs: 7.4 10*3/uL (ref 1.7–7.7)

## 2011-07-01 LAB — COMPREHENSIVE METABOLIC PANEL
AST: 41 U/L — ABNORMAL HIGH (ref 0–37)
Albumin: 3.7 g/dL (ref 3.5–5.2)
BUN: 7 mg/dL (ref 6–23)
Calcium: 9.2 mg/dL (ref 8.4–10.5)
Chloride: 106 mEq/L (ref 96–112)
Creatinine, Ser: 0.49 mg/dL — ABNORMAL LOW (ref 0.50–1.10)
Total Bilirubin: 0.1 mg/dL — ABNORMAL LOW (ref 0.3–1.2)
Total Protein: 7.5 g/dL (ref 6.0–8.3)

## 2011-07-01 LAB — URINE MICROSCOPIC-ADD ON

## 2011-07-01 LAB — RAPID URINE DRUG SCREEN, HOSP PERFORMED
Amphetamines: NOT DETECTED
Barbiturates: NOT DETECTED
Opiates: NOT DETECTED
Tetrahydrocannabinol: NOT DETECTED

## 2011-07-01 MED ORDER — PANTOPRAZOLE SODIUM 40 MG IV SOLR
40.0000 mg | Freq: Once | INTRAVENOUS | Status: AC
  Administered 2011-07-01: 40 mg via INTRAVENOUS
  Filled 2011-07-01: qty 40

## 2011-07-01 MED ORDER — ONDANSETRON HCL 4 MG PO TABS: 4.0000 mg | ORAL_TABLET | Freq: Four times a day (QID) | ORAL | Status: AC

## 2011-07-01 MED ORDER — DICYCLOMINE HCL 20 MG PO TABS: 20.0000 mg | ORAL_TABLET | Freq: Two times a day (BID) | ORAL | Status: DC

## 2011-07-01 MED ORDER — GI COCKTAIL ~~LOC~~
30.0000 mL | Freq: Once | ORAL | Status: AC
  Administered 2011-07-01: 30 mL via ORAL
  Filled 2011-07-01 (×2): qty 30

## 2011-07-01 MED ORDER — IOHEXOL 300 MG/ML  SOLN
20.0000 mL | INTRAMUSCULAR | Status: AC
  Administered 2011-07-01: 20 mL via ORAL

## 2011-07-01 MED ORDER — ONDANSETRON HCL 4 MG/2ML IJ SOLN
4.0000 mg | Freq: Once | INTRAMUSCULAR | Status: AC
  Administered 2011-07-01: 4 mg via INTRAVENOUS
  Filled 2011-07-01: qty 2

## 2011-07-01 MED ORDER — SODIUM CHLORIDE 0.9 % IV SOLN
INTRAVENOUS | Status: DC
  Administered 2011-07-01: 21:00:00 via INTRAVENOUS

## 2011-07-01 MED ORDER — MORPHINE SULFATE 4 MG/ML IJ SOLN
4.0000 mg | Freq: Once | INTRAMUSCULAR | Status: AC
  Administered 2011-07-01: 4 mg via INTRAVENOUS
  Filled 2011-07-01: qty 1

## 2011-07-01 MED ORDER — IOHEXOL 350 MG/ML SOLN
100.0000 mL | Freq: Once | INTRAVENOUS | Status: AC | PRN
  Administered 2011-07-01: 100 mL via INTRAVENOUS

## 2011-07-01 MED ORDER — LORAZEPAM 2 MG/ML IJ SOLN: 1.0000 mg | Freq: Once | INTRAMUSCULAR | Status: DC

## 2011-07-01 MED ORDER — SODIUM CHLORIDE 0.9 % IV BOLUS (SEPSIS)
500.0000 mL | Freq: Once | INTRAVENOUS | Status: AC
  Administered 2011-07-01: 500 mL via INTRAVENOUS

## 2011-07-01 NOTE — ED Notes (Signed)
Pt alert just received order for c-t iv team called to get a-c iv no visible veins

## 2011-07-01 NOTE — ED Notes (Signed)
Pt returned from c-t coughing intermittently  Up to the br

## 2011-07-01 NOTE — ED Notes (Signed)
Report given moved to yellow

## 2011-07-01 NOTE — ED Provider Notes (Signed)
I assumed care of this patient from Dr. Effie Shy in triage. She presents with nausea, vomiting and diarrhea have been going on for the past 3 days. She also endorses some shortness of breath and chest pain. History of nonspecified stomach "soft tissue" cancer that was incompletely treated in [place]. Seen 2 days ago for same.  Abdomen soft, nontender.  Reproducible chest wall tenderness.  CT negative for acute pathology,. Labs unremarkable. UA negative.  No vomiting in the ED.  Tolerating PO liquids. Clinically well hydrated.  Continue supportive care.  Follow up PCP.  Glynn Octave, MD 07/02/11 Earle Gell

## 2011-07-01 NOTE — ED Notes (Signed)
Pt states seen for the same 2 days ago and given medication. Pt denies any improvement and states she fells worse. Pt c/o pain in chest that increases with movement. Pt states vomiting bile and c/o pain between shoulder blades.

## 2011-07-01 NOTE — ED Provider Notes (Signed)
History   This chart was scribed for Flint Melter, MD by Shari Heritage. The patient was seen in room STRE3/STRE3. Patient's care was started at 1525.     CSN: [id number]  Arrival date & time 07/01/11  1525   First MD Initiated Contact with Patient 07/01/11 1749      Chief Complaint  Patient presents with  . Nausea  . Diarrhea  . Emesis  . Shortness of Breath    (Consider location/radiation/quality/duration/timing/severity/associated sxs/prior treatment) The history is provided by the patient. No language interpreter was used.   Tina Saunders is a 26 y.o. female who presents to the Emergency Department complaining of nausea with associated vomiting and diarrhea onset 1 week ago. Patient has associated symptoms of moderate to severe abdominal pain and chest pain and SOB. Patient says she also has very mild back pain, productive cough (clear phlegm). Patient says she hasn't had this set of symptoms before.  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 Patient says that chemotherapy treatment was recommended. Patient says that she had one round of treatment, but it made her very sick and didn't want to continue. Patient said this happened 6 months ago. Patient is from [place], [place]. Patient claims that she had an endoscopy and a biopsy. Patient says she moved back to [facility] in May after living in [facility] for a year. Patient now lives with friends.  Patient does not have a PCP in [place].   Patient with surgical h/o cholecystectomy and cyst drainage. Patient with medical h/o polycystic ovarian syndrome.  Past Medical History  Diagnosis Date  . Cancer of abdominal wall   . Polycystic ovarian syndrome 07/01/2011    Patient report    Past Surgical History  Procedure Date  . Cholecystectomy     No family history on file.  History  Substance Use Topics  . Smoking status: Not on file  . Smokeless tobacco: Not on file  . Alcohol Use:     OB History    Grav  Para Term Preterm Abortions TAB SAB Ect Mult Living                  Review of Systems A complete 10 system review of systems was obtained and all systems are negative except as noted in the HPI and PMH.   Allergies  Review of patient's allergies indicates no known allergies.  Home Medications   Current Outpatient Rx  Name Route Sig Dispense Refill  . PEPTO-BISMOL PO Oral Take 10 mLs by mouth every 6 (six) hours as needed. For nausea    . PROMETHAZINE HCL 25 MG RE SUPP Rectal Place 25 mg rectally every 6 (six) hours as needed.    . NYQUIL PO Oral Take 10 mLs by mouth at bedtime as needed. For cold symptoms      BP 139/107  Pulse 105  Temp(Src) 98.5 F (36.9 C) (Oral)  Resp 20  SpO2 97%  LMP 06/21/2011  Physical Exam  Nursing note and vitals reviewed. Constitutional: She is oriented to person, place, and time. She appears well-developed and well-nourished. No distress.  HENT:  Head: Normocephalic and atraumatic.  Eyes: Conjunctivae and EOM are normal.  Neck: Neck supple. No tracheal deviation present.  Cardiovascular: Tachycardia present.   No murmur heard. Pulmonary/Chest: Effort normal. No respiratory distress. She has no rales.       Dry cough. Scattered rhonchii. Moderate diffuse chest tenderness.  Abdominal: She exhibits no distension.  Genitourinary:  Costal vertebral angle tenderness bilaterally.  Musculoskeletal: Normal range of motion.       Diffuse bilateral tenderness in lower extremities. Varicose veins visible.  Neurological: She is alert and oriented to person, place, and time. No sensory deficit.  Skin: Skin is dry.  Psychiatric: She has a normal mood and affect. Her behavior is normal.       Patient exhibits incongruous emotions. Patient was smiling during exam while saying that she feels really ill/bad.    ED Course  Procedures (including critical care time) DIAGNOSTIC STUDIES: Oxygen Saturation is 97% on room air, adequate by my  interpretation.    COORDINATION OF CARE: 6:15PM - Patient informed of current plan for treatment and evaluation and agrees with plan at this time. Will order a CAT scan.    Labs Reviewed - No data to display No results found.   No diagnosis found.    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 Patient will need further evaluation and treatment. We'll attempt to get records from [place]. She will be moved to the general emergency department for continuation of evaluation.      I personally performed the services described in this documentation, which was scribed in my presence. The recorded information has been reviewed and considered.     Flint Melter, MD 07/01/11 [phone]

## 2011-07-01 NOTE — ED Notes (Signed)
Pain med given 

## 2011-07-01 NOTE — ED Notes (Signed)
Iv team coming to start another iv

## 2011-07-01 NOTE — ED Notes (Signed)
1000cc added to iv

## 2011-07-01 NOTE — ED Notes (Signed)
N/v/d and sob worsening since last visit here 3 days ago. Now, sob. Vss.

## 2011-07-01 NOTE — Discharge Instructions (Signed)
Nausea and Vomiting  Nausea is a sick feeling that often comes before throwing up (vomiting). Vomiting is a reflex where stomach contents come out of your mouth. Vomiting can cause severe loss of body fluids (dehydration). Children and elderly adults can become dehydrated quickly, especially if they also have diarrhea. Nausea and vomiting are symptoms of a condition or disease. It is important to find the cause of your symptoms.  CAUSES    Direct irritation of the stomach lining. This irritation can result from increased acid production (gastroesophageal reflux disease), infection, food poisoning, taking certain medicines (such as nonsteroidal anti-inflammatory drugs), alcohol use, or tobacco use.   Signals from the brain.These signals could be caused by a headache, heat exposure, an inner ear disturbance, increased pressure in the brain from injury, infection, a tumor, or a concussion, pain, emotional stimulus, or metabolic problems.   An obstruction in the gastrointestinal tract (bowel obstruction).   Illnesses such as diabetes, hepatitis, gallbladder problems, appendicitis, kidney problems, cancer, sepsis, atypical symptoms of a heart attack, or eating disorders.   Medical treatments such as chemotherapy and radiation.   Receiving medicine that makes you sleep (general anesthetic) during surgery.  DIAGNOSIS  Your caregiver may ask for tests to be done if the problems do not improve after a few days. Tests may also be done if symptoms are severe or if the reason for the nausea and vomiting is not clear. Tests may include:   Urine tests.   Blood tests.   Stool tests.   Cultures (to look for evidence of infection).   X-rays or other imaging studies.  Test results can help your caregiver make decisions about treatment or the need for additional tests.  TREATMENT  You need to stay well hydrated. Drink frequently but in small amounts.You may wish to drink water, sports drinks, clear broth, or eat frozen  ice pops or gelatin dessert to help stay hydrated.When you eat, eating slowly may help prevent nausea.There are also some antinausea medicines that may help prevent nausea.  HOME CARE INSTRUCTIONS    Take all medicine as directed by your caregiver.   If you do not have an appetite, do not force yourself to eat. However, you must continue to drink fluids.   If you have an appetite, eat a normal diet unless your caregiver tells you differently.   Eat a variety of complex carbohydrates (rice, wheat, potatoes, bread), lean meats, yogurt, fruits, and vegetables.   Avoid high-fat foods because they are more difficult to digest.   Drink enough water and fluids to keep your urine clear or pale yellow.   If you are dehydrated, ask your caregiver for specific rehydration instructions. Signs of dehydration may include:   Severe thirst.   Dry lips and mouth.   Dizziness.   Dark urine.   Decreasing urine frequency and amount.   Confusion.   Rapid breathing or pulse.  SEEK IMMEDIATE MEDICAL CARE IF:    You have blood or brown flecks (like coffee grounds) in your vomit.   You have black or bloody stools.   You have a severe headache or stiff neck.   You are confused.   You have severe abdominal pain.   You have chest pain or trouble breathing.   You do not urinate at least once every 8 hours.   You develop cold or clammy skin.   You continue to vomit for longer than 24 to 48 hours.   You have a fever.  MAKE SURE YOU:      Understand these instructions.   Will watch your condition.   Will get help right away if you are not doing well or get worse.  Document Released: 01/09/2005 Document Revised: 12/29/2010 Document Reviewed: 06/08/2010  [employer] Patient Information 2012 [employer].

## 2011-07-01 NOTE — ED Notes (Signed)
Med given 

## 2011-07-01 NOTE — ED Notes (Signed)
The pt to c-t

## 2011-07-01 NOTE — ED Notes (Signed)
Ativan not given pt driving

## 2011-07-03 LAB — URINE CULTURE
Colony Count: >=100000
Culture  Setup Time: 201306090509

## 2011-07-13 ENCOUNTER — Emergency Department: Payer: Self-pay | Admitting: Emergency Medicine

## 2011-07-13 LAB — COMPREHENSIVE METABOLIC PANEL
Alkaline Phosphatase: 101 U/L (ref 50–136)
Calcium, Total: 8.8 mg/dL (ref 8.5–10.1)
Chloride: 106 mmol/L (ref 98–107)
Creatinine: 0.68 mg/dL (ref 0.60–1.30)
EGFR (African American): >60
Glucose: 105 mg/dL — ABNORMAL HIGH (ref 65–99)
Osmolality: 277 (ref 275–301)
SGOT(AST): 57 U/L — ABNORMAL HIGH (ref 15–37)
SGPT (ALT): 38 U/L

## 2011-07-13 LAB — CBC
HCT: 40.4 % (ref 35.0–47.0)
HGB: 13.8 g/dL (ref 12.0–16.0)
MCH: 28.7 pg (ref 26.0–34.0)
MCHC: 34.1 g/dL (ref 32.0–36.0)
MCV: 84 fL (ref 80–100)
WBC: 9.8 10*3/uL (ref 3.6–11.0)

## 2011-07-13 LAB — URINALYSIS, COMPLETE
Glucose,UR: NEGATIVE mg/dL (ref 0–75)
Ketone: NEGATIVE
Nitrite: NEGATIVE
Protein: NEGATIVE
Specific Gravity: 1.023 (ref 1.003–1.030)
WBC UR: 5 /HPF (ref 0–5)

## 2011-07-13 LAB — PREGNANCY, URINE: Pregnancy Test, Urine: NEGATIVE m[IU]/mL

## 2011-07-28 ENCOUNTER — Encounter (HOSPITAL_COMMUNITY): Payer: Self-pay | Admitting: Physical Medicine and Rehabilitation

## 2011-07-28 ENCOUNTER — Emergency Department (HOSPITAL_COMMUNITY)
Admission: EM | Admit: 2011-07-28 | Discharge: 2011-07-30 | Payer: Self-pay | Attending: Emergency Medicine | Admitting: Emergency Medicine

## 2011-07-28 DIAGNOSIS — F329 Major depressive disorder, single episode, unspecified: Secondary | ICD-10-CM | POA: Insufficient documentation

## 2011-07-28 DIAGNOSIS — G8929 Other chronic pain: Secondary | ICD-10-CM | POA: Insufficient documentation

## 2011-07-28 DIAGNOSIS — F3289 Other specified depressive episodes: Secondary | ICD-10-CM | POA: Insufficient documentation

## 2011-07-28 DIAGNOSIS — R1110 Vomiting, unspecified: Secondary | ICD-10-CM

## 2011-07-28 DIAGNOSIS — R197 Diarrhea, unspecified: Secondary | ICD-10-CM | POA: Insufficient documentation

## 2011-07-28 DIAGNOSIS — Z8659 Personal history of other mental and behavioral disorders: Secondary | ICD-10-CM | POA: Insufficient documentation

## 2011-07-28 DIAGNOSIS — R45851 Suicidal ideations: Secondary | ICD-10-CM | POA: Insufficient documentation

## 2011-07-28 DIAGNOSIS — R112 Nausea with vomiting, unspecified: Secondary | ICD-10-CM | POA: Insufficient documentation

## 2011-07-28 LAB — URINALYSIS, ROUTINE W REFLEX MICROSCOPIC
Bilirubin Urine: NEGATIVE
Ketones, ur: NEGATIVE mg/dL
Nitrite: NEGATIVE
Protein, ur: NEGATIVE mg/dL
pH: 5 (ref 5.0–8.0)

## 2011-07-28 LAB — COMPREHENSIVE METABOLIC PANEL
ALT: 39 U/L — ABNORMAL HIGH (ref 0–35)
AST: 58 U/L — ABNORMAL HIGH (ref 0–37)
Alkaline Phosphatase: 82 U/L (ref 39–117)
CO2: 22 mEq/L (ref 19–32)
Chloride: 102 mEq/L (ref 96–112)
GFR calc Af Amer: >90 mL/min (ref >90)
GFR calc non Af Amer: >90 mL/min (ref >90)
Glucose, Bld: 83 mg/dL (ref 70–99)
Potassium: 3.8 mEq/L (ref 3.5–5.1)
Sodium: 138 mEq/L (ref 135–145)
Total Bilirubin: 0.3 mg/dL (ref 0.3–1.2)

## 2011-07-28 LAB — CBC WITH DIFFERENTIAL/PLATELET
HCT: 42.7 % (ref 36.0–46.0)
Hemoglobin: 14.5 g/dL (ref 12.0–15.0)
Lymphs Abs: 1.8 10*3/uL (ref 0.7–4.0)
Monocytes Absolute: 0.6 10*3/uL (ref 0.1–1.0)
Monocytes Relative: 6 % (ref 3–12)
Neutro Abs: 6.7 10*3/uL (ref 1.7–7.7)
Neutrophils Relative %: 71 % (ref 43–77)
RBC: 5.1 MIL/uL (ref 3.87–5.11)

## 2011-07-28 LAB — POCT I-STAT, CHEM 8
Chloride: 105 mEq/L (ref 96–112)
Creatinine, Ser: 0.5 mg/dL (ref 0.50–1.10)
Glucose, Bld: 125 mg/dL — ABNORMAL HIGH (ref 70–99)
HCT: 41 % (ref 36.0–46.0)
Hemoglobin: 13.9 g/dL (ref 12.0–15.0)
Potassium: 3.8 mEq/L (ref 3.5–5.1)
Sodium: 140 mEq/L (ref 135–145)

## 2011-07-28 LAB — URINE MICROSCOPIC-ADD ON

## 2011-07-28 LAB — SALICYLATE LEVEL: Salicylate Lvl: <2 mg/dL — ABNORMAL LOW (ref 2.8–20.0)

## 2011-07-28 LAB — RAPID URINE DRUG SCREEN, HOSP PERFORMED
Amphetamines: NOT DETECTED
Barbiturates: NOT DETECTED
Benzodiazepines: NOT DETECTED
Tetrahydrocannabinol: NOT DETECTED

## 2011-07-28 LAB — ETHANOL: Alcohol, Ethyl (B): <11 mg/dL (ref 0–11)

## 2011-07-28 MED ORDER — ACETAMINOPHEN 325 MG PO TABS
650.0000 mg | ORAL_TABLET | Freq: Once | ORAL | Status: AC
  Administered 2011-07-28: 650 mg via ORAL
  Filled 2011-07-28: qty 2

## 2011-07-28 NOTE — ED Notes (Signed)
Pt presents to department for evaluation of lower back pain, N/V/D generalized weakness and dizziness. Ongoing x3 days. 10/10 pain upon arrival. Denies urinary symptoms. Denies vaginal bleeding/discharge. Pt is conscious alert and oriented x4. No signs of distress noted at the time.
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
## 2011-07-28 NOTE — ED Notes (Signed)
Given [place] sandwich apple sauce and sprite to patient.  Patient calm cooperative watching TV.

## 2011-07-28 NOTE — ED Provider Notes (Signed)
History     CSN: [id number]  Arrival date & time 07/28/11  1324   First MD Initiated Contact with Patient 07/28/11 1657      Chief Complaint  Patient presents with  . Abdominal Pain  . Nausea  . Emesis  . Diarrhea    (Consider location/radiation/quality/duration/timing/severity/associated sxs/prior treatment) HPI This 26 year old female has had daily chronic diffuse abdominal pain for years with a daily nonbloody vomiting and diarrhea 10-12 times a day at least for years. This pattern has been unchanged for years. She also has chronic low back pain chest pain and pain all 4 extremities 24 hours a day for years as well. At pattern is also unchanged. She is seeing multiple doctors and had multiple evaluations which appear unremarkable to explain her multitude of symptoms. She also has a history of bipolar disorder and schizophrenia according to the patient. Patient states that because she has chronic pain she no longer wants to live any suicidal ideation and voices are telling her to kill her self. She denies any specific plan but is out of either overdosing or cutting her wrists. She does not want to harm anyone else. She states she will not harm herself in the emergency department. She has no fever no headache no confusion no lateralizing weakness or numbness no change in bowel or bladder function no dysuria no vaginal discharge or vaginal bleeding. There is no change in her multiple chronic pain syndromes. She just moved back to [place] from [place] 2 months ago and her last psychiatric hospitalization in [place] for the same problem was less than 1 year ago. Past Medical History  Diagnosis Date  . Cancer of abdominal wall   . 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 Cholecystectomy     No family history on file.  History  Substance  Use Topics  . Smoking status: Never Smoker   . Smokeless tobacco: Not on file  . Alcohol Use: No    OB History    Grav Para Term Preterm Abortions TAB SAB Ect Mult Living                  Review of Systems  Constitutional: Negative for fever.       10 Systems reviewed and are negative for acute change except as noted in the HPI.  HENT: Negative for congestion.   Eyes: Negative for discharge and redness.  Respiratory: Negative for cough and shortness of breath.   Cardiovascular: Negative for chest pain.  Gastrointestinal: Positive for nausea, vomiting, abdominal pain and diarrhea.  Genitourinary: Negative for dysuria, vaginal bleeding and vaginal discharge.  Musculoskeletal: Positive for back pain.  Skin: Negative for rash.  Neurological: Negative for syncope, numbness and headaches.  Psychiatric/Behavioral: Positive for suicidal ideas and hallucinations. The patient is nervous/anxious.        No behavior change.    Allergies  Morphine and related  Home Medications   Current Outpatient Rx  Name Route Sig Dispense Refill  . PROMETHAZINE HCL 25 MG RE SUPP Rectal Place 25 mg rectally every 6 (six) hours as needed.      BP 143/96  Pulse 95  Temp 98 F (36.7 C) (Oral)  Resp 16  SpO2 96%  LMP 06/21/2011  Physical Exam  Nursing note and vitals reviewed. Constitutional:  Awake, alert, nontoxic appearance with baseline speech for patient.  HENT:  Head: Atraumatic.  Mouth/Throat: No oropharyngeal exudate.  Eyes: EOM are normal. Pupils are equal, round, and reactive to light. Right eye exhibits no discharge. Left eye exhibits no discharge.  Neck: Neck supple.  Cardiovascular: Normal rate and regular rhythm.   No murmur heard. Pulmonary/Chest: Effort normal and breath sounds normal. No stridor. No respiratory distress. She has no wheezes. She has no rales. She exhibits tenderness.  Abdominal: Soft. Bowel sounds are normal. She exhibits no mass. There is tenderness.  There is no rebound and no guarding.       Diffuse mild tenderness  Musculoskeletal: She exhibits tenderness.       Baseline ROM, moves extremities with no obvious new focal weakness. Low back and all 4 ext tender to palpation.  Lymphadenopathy:    She has no cervical adenopathy.  Neurological: She is alert.       Awake, alert, cooperative and aware of situation; motor strength bilaterally; sensation normal to light touch bilaterally; peripheral visual fields full to confrontation; no facial asymmetry; tongue midline; major cranial nerves appear intact; no pronator drift, normal finger to nose bilaterally, baseline gait without new ataxia.  Skin: No rash noted.  Psychiatric:       Anxious, depressed, SI, states hallucinating, denies HI    ED Course  Procedures (including critical care time) Seen by [facility], initially declined by [facility] but will re-review in AM. Tele-psych consulted.[id number] Labs Reviewed  URINALYSIS, ROUTINE W REFLEX MICROSCOPIC - Abnormal; Notable for the following:    Leukocytes, UA SMALL (*)     All other components within normal limits  POCT I-STAT, CHEM 8 - Abnormal; Notable for the following:    BUN 3 (*)     Glucose, Bld 125 (*)     All other components within normal limits  SALICYLATE LEVEL - Abnormal; Notable for the following:    Salicylate Lvl <2.0 (*)     All other components within normal limits  COMPREHENSIVE METABOLIC PANEL - Abnormal; Notable for the following:    BUN 5 (*)     Creatinine, Ser 0.47 (*)     AST 58 (*)     ALT 39 (*)     All other components within normal limits  URINE MICROSCOPIC-ADD ON - Abnormal; Notable for the following:    Squamous Epithelial / LPF MANY (*)     Bacteria, UA FEW (*)     All other components within normal limits  CBC WITH DIFFERENTIAL  ACETAMINOPHEN LEVEL  LIPASE, BLOOD  PREGNANCY, URINE  ETHANOL  URINE RAPID DRUG SCREEN (HOSP PERFORMED)   No results found.   1. Suicidal ideation   2. Depression   3. Chronic  pain   4. Chronic abdominal pain   5. Chronic vomiting       MDM  Patient understands and agrees with initial ED impression and plan with expectations set for ED visit.        Hurman Horn, MD 07/29/11 1316

## 2011-07-28 NOTE — ED Notes (Signed)
MD informed of pt complaint. Pt placed in paper scrubs security informed belongings/ patient  wanded

## 2011-07-28 NOTE — ED Notes (Signed)
Pt states she "I don't feel right in my head, I think I want to kill myself. Pt states she feels she will harm herself if she goes home. Pt denies clear plan. States " I don't don't know I will take pills of slit my wrist." Pt states there is a voice in my head that says "you should just kill yourself." Pt states last time I started cutting my self.

## 2011-07-29 MED ORDER — ONDANSETRON HCL 8 MG PO TABS
ORAL_TABLET | ORAL | Status: AC
  Filled 2011-07-29: qty 1

## 2011-07-29 MED ORDER — IBUPROFEN 400 MG PO TABS: 600.0000 mg | ORAL_TABLET | Freq: Three times a day (TID) | ORAL | Status: DC | PRN

## 2011-07-29 MED ORDER — ALUM & MAG HYDROXIDE-SIMETH 200-200-20 MG/5ML PO SUSP: 30.0000 mL | ORAL | Status: DC | PRN

## 2011-07-29 MED ORDER — NICOTINE 21 MG/24HR TD PT24: 21.0000 mg | MEDICATED_PATCH | Freq: Every day | TRANSDERMAL | Status: DC

## 2011-07-29 MED ORDER — ONDANSETRON HCL 8 MG PO TABS: 4.0000 mg | ORAL_TABLET | Freq: Three times a day (TID) | ORAL | Status: DC | PRN

## 2011-07-29 MED ORDER — LORAZEPAM 1 MG PO TABS: 1.0000 mg | ORAL_TABLET | Freq: Three times a day (TID) | ORAL | Status: DC | PRN

## 2011-07-29 MED ORDER — ACETAMINOPHEN 325 MG PO TABS
650.0000 mg | ORAL_TABLET | ORAL | Status: DC | PRN
  Administered 2011-07-29 (×2): 650 mg via ORAL
  Filled 2011-07-29 (×2): qty 2

## 2011-07-29 MED ORDER — ZOLPIDEM TARTRATE 5 MG PO TABS
5.0000 mg | ORAL_TABLET | Freq: Every evening | ORAL | Status: DC | PRN
  Administered 2011-07-29: 5 mg via ORAL
  Filled 2011-07-29: qty 1

## 2011-07-29 MED ORDER — ONDANSETRON HCL 8 MG PO TABS
8.0000 mg | ORAL_TABLET | Freq: Once | ORAL | Status: AC
  Administered 2011-07-29: 8 mg via ORAL

## 2011-07-29 NOTE — ED Provider Notes (Signed)
BP 143/96  Pulse 95  Temp 98 F (36.7 C) (Oral)  Resp 16  SpO2 96%  LMP 06/21/2011  Patient seen and evaluated by me. No complaints at this time.   Forbes Cellar, MD 07/29/11 1525
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
## 2011-07-29 NOTE — ED Notes (Signed)
Emesis - moderate. Feels better after given zofran.

## 2011-07-30 NOTE — BHH Counselor (Signed)
Clinician spoke with [facility] intake staff who states that Pt has been accept but wanted pt put on IVC paper before releasing the name of the accepting physician. Clinician did complete IVC paper & fax information to [facility]. [facility] intake Nurse confirmed pt was accepted & the accepting Physician was Dr. Beverly Jones & number to call report was [phone]. Clinician has notified EDP (Dr. Optiz & Dr. Campos) of disposition as well as pt's Nurse. 

## 2011-07-30 NOTE — ED Notes (Signed)
Report called to [facility] mental health nurse

## 2011-07-30 NOTE — ED Notes (Signed)
Coordination with sheriff for transport to [place] done

## 2011-07-30 NOTE — ED Notes (Signed)
Pt transported via [facility] to [facility] for further treatment.

## 2011-08-25 ENCOUNTER — Emergency Department (HOSPITAL_COMMUNITY): Payer: Self-pay

## 2011-08-25 ENCOUNTER — Encounter (HOSPITAL_COMMUNITY): Payer: Self-pay | Admitting: Emergency Medicine

## 2011-08-25 ENCOUNTER — Other Ambulatory Visit: Payer: Self-pay

## 2011-08-25 ENCOUNTER — Inpatient Hospital Stay (HOSPITAL_COMMUNITY)
Admission: EM | Admit: 2011-08-25 | Discharge: 2011-08-29 | DRG: 194 | Disposition: A | Discharge status: Home / Self Care | Payer: 59 | Attending: Internal Medicine | Admitting: Internal Medicine

## 2011-08-25 DIAGNOSIS — J039 Acute tonsillitis, unspecified: Secondary | ICD-10-CM | POA: Diagnosis present

## 2011-08-25 DIAGNOSIS — I471 Supraventricular tachycardia, unspecified: Secondary | ICD-10-CM | POA: Diagnosis present

## 2011-08-25 DIAGNOSIS — Z86718 Personal history of other venous thrombosis and embolism: Secondary | ICD-10-CM

## 2011-08-25 DIAGNOSIS — E669 Obesity, unspecified: Secondary | ICD-10-CM | POA: Diagnosis present

## 2011-08-25 DIAGNOSIS — R000 Tachycardia, unspecified: Secondary | ICD-10-CM

## 2011-08-25 DIAGNOSIS — Z6841 Body Mass Index (BMI) 40.0 and over, adult: Secondary | ICD-10-CM

## 2011-08-25 DIAGNOSIS — R52 Pain, unspecified: Secondary | ICD-10-CM

## 2011-08-25 DIAGNOSIS — K219 Gastro-esophageal reflux disease without esophagitis: Secondary | ICD-10-CM | POA: Diagnosis present

## 2011-08-25 DIAGNOSIS — Z885 Allergy status to narcotic agent status: Secondary | ICD-10-CM

## 2011-08-25 DIAGNOSIS — Z9089 Acquired absence of other organs: Secondary | ICD-10-CM

## 2011-08-25 DIAGNOSIS — R079 Chest pain, unspecified: Secondary | ICD-10-CM

## 2011-08-25 DIAGNOSIS — E876 Hypokalemia: Secondary | ICD-10-CM | POA: Diagnosis present

## 2011-08-25 DIAGNOSIS — R0602 Shortness of breath: Secondary | ICD-10-CM | POA: Diagnosis present

## 2011-08-25 DIAGNOSIS — R509 Fever, unspecified: Secondary | ICD-10-CM | POA: Diagnosis present

## 2011-08-25 DIAGNOSIS — E282 Polycystic ovarian syndrome: Secondary | ICD-10-CM | POA: Diagnosis present

## 2011-08-25 DIAGNOSIS — Z8589 Personal history of malignant neoplasm of other organs and systems: Secondary | ICD-10-CM

## 2011-08-25 DIAGNOSIS — J189 Pneumonia, unspecified organism: Principal | ICD-10-CM | POA: Diagnosis present

## 2011-08-25 DIAGNOSIS — Z8249 Family history of ischemic heart disease and other diseases of the circulatory system: Secondary | ICD-10-CM

## 2011-08-25 HISTORY — DX: Malignant neoplasm of connective and soft tissue, unspecified: C49.9

## 2011-08-25 LAB — PROTIME-INR: Prothrombin Time: 13.3 seconds (ref 11.6–15.2)

## 2011-08-25 LAB — URINALYSIS, ROUTINE W REFLEX MICROSCOPIC
Glucose, UA: NEGATIVE mg/dL
Hgb urine dipstick: NEGATIVE
Specific Gravity, Urine: 1.023 (ref 1.005–1.030)
pH: 5.5 (ref 5.0–8.0)

## 2011-08-25 LAB — BASIC METABOLIC PANEL
CO2: 22 mEq/L (ref 19–32)
Calcium: 9 mg/dL (ref 8.4–10.5)
Chloride: 103 mEq/L (ref 96–112)
Glucose, Bld: 114 mg/dL — ABNORMAL HIGH (ref 70–99)
Sodium: 137 mEq/L (ref 135–145)

## 2011-08-25 LAB — CBC
Hemoglobin: 13.8 g/dL (ref 12.0–15.0)
MCH: 28.6 pg (ref 26.0–34.0)
Platelets: 213 10*3/uL (ref 150–400)
RBC: 4.83 MIL/uL (ref 3.87–5.11)
WBC: 9.7 10*3/uL (ref 4.0–10.5)

## 2011-08-25 LAB — RAPID URINE DRUG SCREEN, HOSP PERFORMED
Amphetamines: NOT DETECTED
Barbiturates: NOT DETECTED
Opiates: NOT DETECTED
Tetrahydrocannabinol: NOT DETECTED

## 2011-08-25 LAB — URINE MICROSCOPIC-ADD ON

## 2011-08-25 LAB — ACETAMINOPHEN LEVEL: Acetaminophen (Tylenol), Serum: <15 ug/mL (ref 10–30)

## 2011-08-25 LAB — CARDIAC PANEL(CRET KIN+CKTOT+MB+TROPI)
CK, MB: 1.2 ng/mL (ref 0.3–4.0)
Relative Index: INVALID (ref 0.0–2.5)
Total CK: 49 U/L (ref 7–177)
Troponin I: <0.3 ng/mL (ref <0.30)

## 2011-08-25 LAB — SALICYLATE LEVEL: Salicylate Lvl: <2 mg/dL — ABNORMAL LOW (ref 2.8–20.0)

## 2011-08-25 MED ORDER — SODIUM CHLORIDE 0.9 % IV BOLUS (SEPSIS)
1000.0000 mL | Freq: Once | INTRAVENOUS | Status: AC
  Administered 2011-08-25: 1000 mL via INTRAVENOUS

## 2011-08-25 MED ORDER — HYDROMORPHONE HCL PF 1 MG/ML IJ SOLN
INTRAMUSCULAR | Status: AC
  Filled 2011-08-25: qty 1

## 2011-08-25 MED ORDER — ONDANSETRON HCL 4 MG/2ML IJ SOLN
4.0000 mg | Freq: Once | INTRAMUSCULAR | Status: AC
  Administered 2011-08-25: 4 mg via INTRAVENOUS
  Filled 2011-08-25: qty 2

## 2011-08-25 MED ORDER — IOHEXOL 350 MG/ML SOLN
95.0000 mL | Freq: Once | INTRAVENOUS | Status: AC | PRN
  Administered 2011-08-25: 95 mL via INTRAVENOUS

## 2011-08-25 MED ORDER — HYDROMORPHONE HCL PF 1 MG/ML IJ SOLN
0.5000 mg | Freq: Once | INTRAMUSCULAR | Status: AC
  Administered 2011-08-25: 0.5 mg via INTRAVENOUS
  Filled 2011-08-25: qty 1

## 2011-08-25 MED ORDER — HYDROMORPHONE HCL PF 1 MG/ML IJ SOLN
0.5000 mg | Freq: Once | INTRAMUSCULAR | Status: AC
  Administered 2011-08-25: 0.5 mg via INTRAVENOUS

## 2011-08-25 NOTE — ED Provider Notes (Signed)
History     CSN: [id number]  Arrival date & time 08/25/11  1657   First MD Initiated Contact with Patient 08/25/11 1742      Chief Complaint  Patient presents with  . Pain  . Tachycardia    (Consider location/radiation/quality/duration/timing/severity/associated sxs/prior treatment) HPI Pt has history of "soft tissue tumor" LLE and abd. She has not had treatment for this. She ran out of her neurontin yesterday. State that today she had acute onset diffuse pain with palpitations and SOB. 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 No N/V/D, urinary symptoms.  Past Medical History  Diagnosis Date  . Cancer of abdominal wall   . Polycystic ovarian syndrome 07/01/2011    Patient report  . Rhabdosarcoma     Past Surgical History  Procedure Date  . Cholecystectomy   . Varicose vein surgery   . Ovarian cyst excision     Family History  Problem Relation Age of Onset  . Coronary artery disease Maternal Grandmother   . Diabetes type II Maternal Grandmother   . Cancer Maternal Grandmother   . Hypertension Mother   . Hypertension Father     History  Substance Use Topics  . Smoking status: Never Smoker   . Smokeless tobacco: Not on file  . Alcohol Use: No    OB History    Grav Para Term Preterm Abortions TAB SAB Ect Mult Living                  Review of Systems  Constitutional: Negative for fever and chills.  Respiratory: Positive for shortness of breath. Negative for cough and wheezing.   Cardiovascular: Positive for chest pain, palpitations and leg swelling.  Gastrointestinal: Positive for abdominal pain.  Genitourinary: Negative for dysuria.  Musculoskeletal: Positive for myalgias, back pain and arthralgias.  Skin: Negative for pallor and rash.  Neurological: Negative for dizziness, weakness, light-headedness, numbness and headaches.    Allergies  Morphine and related  Home Medications   No current outpatient prescriptions on  file.  BP 105/63  Pulse 95  Temp 99 F (37.2 C) (Oral)  Resp 18  Ht 5\' 3"  (1.6 m)  Wt 249 lb 1.9 oz (113 kg)  BMI 44.13 kg/m2  SpO2 96%  LMP 08/21/2011  Physical Exam  Nursing note and vitals reviewed. Constitutional: She is oriented to person, place, and time. She appears well-developed and well-nourished. She appears distressed.  HENT:  Head: Normocephalic and atraumatic.  Mouth/Throat: Oropharynx is clear and moist.  Eyes: EOM are normal. Pupils are equal, round, and reactive to light.  Neck: Normal range of motion. Neck supple.  Cardiovascular: Regular rhythm.        tachycardia  Pulmonary/Chest: Effort normal and breath sounds normal. No respiratory distress. She has no wheezes. She has no rales. She exhibits tenderness.  Abdominal: Soft. Bowel sounds are normal. She exhibits no distension and no mass. There is tenderness (Generalized TTP with no focality). There is no rebound and no guarding.  Musculoskeletal: Normal range of motion. She exhibits tenderness (Diffuse TTP over all part of pt body seemingly without focality). She exhibits no edema.  Neurological: She is alert and oriented to person, place, and time.       5/5 motor, sensation grossly intact  Skin: Skin is warm and dry. No rash noted. No erythema.  Psychiatric:       bizarre behavior, uncooperative    ED Course  Procedures (including critical care  time)  Labs Reviewed  BASIC METABOLIC PANEL - Abnormal; Notable for the following:    Glucose, Bld 114 (*)     All other components within normal limits  URINALYSIS, ROUTINE W REFLEX MICROSCOPIC - Abnormal; Notable for the following:    APPearance HAZY (*)     Leukocytes, UA SMALL (*)     All other components within normal limits  SALICYLATE LEVEL - Abnormal; Notable for the following:    Salicylate Lvl <2.0 (*)     All other components within normal limits  URINE MICROSCOPIC-ADD ON - Abnormal; Notable for the following:    Squamous Epithelial / LPF FEW (*)      Bacteria, UA FEW (*)     All other components within normal limits  CBC - Abnormal; Notable for the following:    WBC 10.6 (*)     All other components within normal limits  BASIC METABOLIC PANEL - Abnormal; Notable for the following:    Potassium 3.4 (*)  DELTA CHECK NOTED   Glucose, Bld 110 (*)     Calcium 8.1 (*)     All other components within normal limits  CBC - Abnormal; Notable for the following:    Hemoglobin 11.5 (*)     HCT 34.5 (*)     All other components within normal limits  HEPATIC FUNCTION PANEL - Abnormal; Notable for the following:    Albumin 2.7 (*)     AST 40 (*)     Total Bilirubin 0.2 (*)     All other components within normal limits  CBC  CARDIAC PANEL(CRET KIN+CKTOT+MB+TROPI)  PROTIME-INR  APTT  URINE RAPID DRUG SCREEN (HOSP PERFORMED)  ETHANOL  ACETAMINOPHEN LEVEL  PREGNANCY, URINE  POCT PREGNANCY, URINE  CREATININE, SERUM  CARDIAC PANEL(CRET KIN+CKTOT+MB+TROPI)  CARDIAC PANEL(CRET KIN+CKTOT+MB+TROPI)  TSH  LACTIC ACID, PLASMA  PRO B NATRIURETIC PEPTIDE  CARDIAC PANEL(CRET KIN+CKTOT+MB+TROPI)  CULTURE, BLOOD (ROUTINE X 2)  CULTURE, BLOOD (ROUTINE X 2)  STREP PNEUMONIAE URINARY ANTIGEN  LEGIONELLA ANTIGEN, URINE  URINE CULTURE  CBC  COMPREHENSIVE METABOLIC PANEL   Ct Angio Chest Pe W/cm &/or Wo Cm  08/25/2011  *RADIOLOGY REPORT*  Clinical Data: 26 year old female with shortness of breath. History of sarcoma.  CT ANGIOGRAPHY CHEST  Technique:  Multidetector CT imaging of the chest using the standard protocol during bolus administration of intravenous contrast. Multiplanar reconstructed images including MIPs were obtained and reviewed to evaluate the vascular anatomy.  Contrast:95 ml intravenous Omnipaque-300  Comparison: 07/01/2011 CT  Findings: This is a technically limited study secondary to mild respiratory motion artifact and suboptimal contrast opacification.  No large or central pulmonary emboli are identified. There is no evidence of  thoracic aortic aneurysm. Upper limits normal heart size noted.  Tiny bilateral pleural effusions and bibasilar atelectasis noted. There is no evidence of pericardial effusion or enlarged lymph nodes.  Very mild central ground-glass opacities are noted and may represent mild edema. There is no evidence of airspace disease, nodule, mass, or endobronchial/endotracheal lesion. The visualized upper abdomen is unremarkable. No acute or suspicious bony abnormalities are present.  IMPRESSION: Suboptimal evaluation for pulmonary emboli, but no large or central pulmonary emboli identified.  Mild central ground-glass opacities which could represent mild interstitial edema.  Tiny bilateral pleural effusions and mild bibasilar atelectasis.  Original Report Authenticated By: Rosendo Gros, M.D.   Dg Chest Port 1 View  08/25/2011  *RADIOLOGY REPORT*  Clinical Data: Shortness of breath and chest pressure  PORTABLE CHEST - 1 VIEW  Comparison: 07/01/2011  Findings: The heart size and mediastinal contours are within normal limits.  Both lungs are clear.  The visualized skeletal structures are unremarkable.  IMPRESSION: No active disease.  Original Report Authenticated By: Rosealee Albee, M.D.     1. Tachycardia   2. Diffuse Pain   3. Chest pain   4. SOB (shortness of breath)   5. Polycystic ovaries   6. Fever   7. PSVT (paroxysmal supraventricular tachycardia)   8. Hypokalemia      Date: 08/25/2011  Rate: 165  Rhythm: sinus tachycardia  QRS Axis: normal  Intervals: normal  ST/T Wave abnormalities: indeterminate  Conduction Disutrbances:none  Narrative Interpretation:   Old EKG Reviewed: none available     MDM   Pt resting well after IV narcotics. HR is still in 120's. No large PE's found. Discussed with [facility] who will see in ED and admit       Loren Racer, MD 08/26/11 1705

## 2011-08-25 NOTE — ED Notes (Signed)
Pt states she woke up this am with pain "everywhere." Pt will not specify locations. Pt Denies chest pain. Pt states she is SOB, n/vx4, dizziness.

## 2011-08-25 NOTE — ED Notes (Addendum)
Sleeping/ sedated/ restless. Family at [facility], HR 138, other VSS.  EDP into speak with pt reguarding plan. Family updated. Pending admission orders and bed assignment. Pt aroused and stated she needed to void. Assisted with BP.

## 2011-08-25 NOTE — ED Notes (Signed)
Pt c/o inc pain. Pt states dilaudid did not help at all. MD notified.

## 2011-08-25 NOTE — ED Notes (Addendum)
C/o pain all over since this morning. See abnormal vitals.  Pt doesn't know LMP- doesn't remember if she had one this month or last month.

## 2011-08-25 NOTE — ED Notes (Signed)
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

## 2011-08-25 NOTE — ED Notes (Signed)
Ct stated pt IV infiltrated and they could not finish CT scan.

## 2011-08-25 NOTE — ED Notes (Addendum)
Pt converted to Sinus Tach, HR 137

## 2011-08-25 NOTE — ED Notes (Signed)
Pt is asleep, respirations are unlabored and even.

## 2011-08-25 NOTE — ED Notes (Signed)
CT notified that new IV was placed successfully and CTA could now be performed

## 2011-08-25 NOTE — ED Notes (Signed)
Pt transported to CT ?

## 2011-08-25 NOTE — Progress Notes (Signed)
This patient has received 50 ml's of IV Omnipaque 350contrast extravasation into right upper arm during an Angio Chest exam.  The exam was performed on 08/25/2011  Site / affected area assessed by Signa Kell MD

## 2011-08-25 NOTE — ED Notes (Signed)
Pt stating she is having more trouble breathing. Expiratory wheezes auscultated. Lips pink, o2 sat97% on 2L. Dr. Ranae Palms informed.

## 2011-08-26 ENCOUNTER — Encounter (HOSPITAL_COMMUNITY): Payer: Self-pay | Admitting: Internal Medicine

## 2011-08-26 DIAGNOSIS — I471 Supraventricular tachycardia: Secondary | ICD-10-CM

## 2011-08-26 DIAGNOSIS — M7989 Other specified soft tissue disorders: Secondary | ICD-10-CM

## 2011-08-26 DIAGNOSIS — R079 Chest pain, unspecified: Secondary | ICD-10-CM

## 2011-08-26 DIAGNOSIS — R000 Tachycardia, unspecified: Secondary | ICD-10-CM

## 2011-08-26 DIAGNOSIS — E282 Polycystic ovarian syndrome: Secondary | ICD-10-CM

## 2011-08-26 DIAGNOSIS — R0602 Shortness of breath: Secondary | ICD-10-CM

## 2011-08-26 DIAGNOSIS — R509 Fever, unspecified: Secondary | ICD-10-CM

## 2011-08-26 DIAGNOSIS — E876 Hypokalemia: Secondary | ICD-10-CM

## 2011-08-26 DIAGNOSIS — R072 Precordial pain: Secondary | ICD-10-CM

## 2011-08-26 LAB — CARDIAC PANEL(CRET KIN+CKTOT+MB+TROPI)
CK, MB: 1 ng/mL (ref 0.3–4.0)
Relative Index: INVALID (ref 0.0–2.5)
Relative Index: INVALID (ref 0.0–2.5)
Total CK: 35 U/L (ref 7–177)
Total CK: 31 U/L (ref 7–177)
Troponin I: <0.3 ng/mL (ref <0.30)
Troponin I: <0.3 ng/mL (ref <0.30)

## 2011-08-26 LAB — CBC
Hemoglobin: 12.5 g/dL (ref 12.0–15.0)
Hemoglobin: 11.5 g/dL — ABNORMAL LOW (ref 12.0–15.0)
MCH: 28 pg (ref 26.0–34.0)
MCV: 83.9 fL (ref 78.0–100.0)
Platelets: 196 10*3/uL (ref 150–400)
Platelets: 194 10*3/uL (ref 150–400)
RBC: 4.35 MIL/uL (ref 3.87–5.11)
RBC: 4.1 MIL/uL (ref 3.87–5.11)
WBC: 10.6 10*3/uL — ABNORMAL HIGH (ref 4.0–10.5)
WBC: 8.4 10*3/uL (ref 4.0–10.5)

## 2011-08-26 LAB — CREATININE, SERUM
Creatinine, Ser: 0.61 mg/dL (ref 0.50–1.10)
GFR calc Af Amer: >90 mL/min (ref >90)

## 2011-08-26 LAB — HEPATIC FUNCTION PANEL
AST: 40 U/L — ABNORMAL HIGH (ref 0–37)
Albumin: 2.7 g/dL — ABNORMAL LOW (ref 3.5–5.2)
Bilirubin, Direct: <0.1 mg/dL (ref 0.0–0.3)

## 2011-08-26 LAB — LACTIC ACID, PLASMA: Lactic Acid, Venous: 1.5 mmol/L (ref 0.5–2.2)

## 2011-08-26 LAB — TSH: TSH: 0.503 u[IU]/mL (ref 0.350–4.500)

## 2011-08-26 LAB — BASIC METABOLIC PANEL
GFR calc Af Amer: >90 mL/min (ref >90)
GFR calc non Af Amer: >90 mL/min (ref >90)
Glucose, Bld: 110 mg/dL — ABNORMAL HIGH (ref 70–99)
Potassium: 3.4 mEq/L — ABNORMAL LOW (ref 3.5–5.1)
Sodium: 135 mEq/L (ref 135–145)

## 2011-08-26 LAB — PRO B NATRIURETIC PEPTIDE: Pro B Natriuretic peptide (BNP): 38.1 pg/mL (ref 0–125)

## 2011-08-26 MED ORDER — SODIUM CHLORIDE 0.9 % IJ SOLN: 3.0000 mL | Freq: Two times a day (BID) | INTRAMUSCULAR | Status: DC

## 2011-08-26 MED ORDER — SODIUM CHLORIDE 0.9 % IV SOLN
INTRAVENOUS | Status: DC
  Administered 2011-08-26 (×2): via INTRAVENOUS
  Administered 2011-08-27 – 2011-08-28 (×3): 100 mL/h via INTRAVENOUS

## 2011-08-26 MED ORDER — ACETAMINOPHEN 325 MG PO TABS
650.0000 mg | ORAL_TABLET | Freq: Four times a day (QID) | ORAL | Status: DC | PRN
  Administered 2011-08-26: 650 mg via ORAL
  Filled 2011-08-26: qty 2

## 2011-08-26 MED ORDER — SODIUM CHLORIDE 0.9 % IV BOLUS (SEPSIS)
500.0000 mL | Freq: Once | INTRAVENOUS | Status: AC
  Administered 2011-08-26: 500 mL via INTRAVENOUS

## 2011-08-26 MED ORDER — LEVALBUTEROL HCL 0.63 MG/3ML IN NEBU
0.6300 mg | INHALATION_SOLUTION | Freq: Four times a day (QID) | RESPIRATORY_TRACT | Status: DC | PRN
  Filled 2011-08-26: qty 3

## 2011-08-26 MED ORDER — ASPIRIN EC 81 MG PO TBEC
81.0000 mg | DELAYED_RELEASE_TABLET | Freq: Every day | ORAL | Status: DC
  Administered 2011-08-27: 81 mg via ORAL
  Filled 2011-08-26 (×3): qty 1

## 2011-08-26 MED ORDER — QUETIAPINE FUMARATE 300 MG PO TABS
300.0000 mg | ORAL_TABLET | Freq: Once | ORAL | Status: AC
  Administered 2011-08-26: 300 mg via ORAL
  Filled 2011-08-26: qty 1

## 2011-08-26 MED ORDER — GABAPENTIN 300 MG PO CAPS
300.0000 mg | ORAL_CAPSULE | Freq: Once | ORAL | Status: AC
  Administered 2011-08-26: 300 mg via ORAL
  Filled 2011-08-26: qty 1

## 2011-08-26 MED ORDER — ONDANSETRON HCL 4 MG PO TABS: 4.0000 mg | ORAL_TABLET | Freq: Four times a day (QID) | ORAL | Status: DC | PRN

## 2011-08-26 MED ORDER — ALUM & MAG HYDROXIDE-SIMETH 200-200-20 MG/5ML PO SUSP: 30.0000 mL | Freq: Four times a day (QID) | ORAL | Status: DC | PRN

## 2011-08-26 MED ORDER — OXYCODONE HCL 5 MG PO TABS
5.0000 mg | ORAL_TABLET | ORAL | Status: DC | PRN
  Administered 2011-08-26 – 2011-08-28 (×4): 5 mg via ORAL
  Filled 2011-08-26 (×4): qty 1

## 2011-08-26 MED ORDER — LEVOFLOXACIN IN D5W 750 MG/150ML IV SOLN
750.0000 mg | INTRAVENOUS | Status: DC
  Administered 2011-08-26 – 2011-08-27 (×2): 750 mg via INTRAVENOUS
  Filled 2011-08-26 (×3): qty 150

## 2011-08-26 MED ORDER — ZOLPIDEM TARTRATE 5 MG PO TABS: 5.0000 mg | ORAL_TABLET | Freq: Every evening | ORAL | Status: DC | PRN

## 2011-08-26 MED ORDER — POTASSIUM CHLORIDE CRYS ER 20 MEQ PO TBCR
40.0000 meq | EXTENDED_RELEASE_TABLET | Freq: Once | ORAL | Status: AC
  Administered 2011-08-26: 40 meq via ORAL
  Filled 2011-08-26 (×2): qty 1

## 2011-08-26 MED ORDER — ENOXAPARIN SODIUM 40 MG/0.4ML ~~LOC~~ SOLN
40.0000 mg | SUBCUTANEOUS | Status: DC
  Administered 2011-08-26 – 2011-08-29 (×4): 40 mg via SUBCUTANEOUS
  Filled 2011-08-26 (×4): qty 0.4

## 2011-08-26 MED ORDER — ASPIRIN EC 325 MG PO TBEC
325.0000 mg | DELAYED_RELEASE_TABLET | Freq: Every day | ORAL | Status: DC
  Administered 2011-08-26: 325 mg via ORAL
  Filled 2011-08-26: qty 1

## 2011-08-26 MED ORDER — HYDROMORPHONE HCL PF 1 MG/ML IJ SOLN
0.5000 mg | INTRAMUSCULAR | Status: DC | PRN
  Administered 2011-08-26 – 2011-08-28 (×9): 1 mg via INTRAVENOUS
  Filled 2011-08-26 (×9): qty 1

## 2011-08-26 MED ORDER — ACETAMINOPHEN 650 MG RE SUPP: 650.0000 mg | Freq: Four times a day (QID) | RECTAL | Status: DC | PRN

## 2011-08-26 MED ORDER — ONDANSETRON HCL 4 MG/2ML IJ SOLN
4.0000 mg | Freq: Four times a day (QID) | INTRAMUSCULAR | Status: DC | PRN
  Administered 2011-08-26 – 2011-08-27 (×3): 4 mg via INTRAVENOUS
  Filled 2011-08-26 (×3): qty 2

## 2011-08-26 MED ORDER — GABAPENTIN 600 MG PO TABS
300.0000 mg | ORAL_TABLET | Freq: Once | ORAL | Status: DC
  Filled 2011-08-26: qty 0.5

## 2011-08-26 MED ORDER — QUETIAPINE FUMARATE 300 MG PO TABS
300.0000 mg | ORAL_TABLET | Freq: Every day | ORAL | Status: DC
  Administered 2011-08-26 – 2011-08-28 (×3): 300 mg via ORAL
  Filled 2011-08-26 (×4): qty 1

## 2011-08-26 MED ORDER — SERTRALINE HCL 50 MG PO TABS
50.0000 mg | ORAL_TABLET | Freq: Every day | ORAL | Status: DC
  Administered 2011-08-26 – 2011-08-29 (×4): 50 mg via ORAL
  Filled 2011-08-26 (×4): qty 1

## 2011-08-26 MED ORDER — METOCLOPRAMIDE HCL 5 MG PO TABS
5.0000 mg | ORAL_TABLET | Freq: Three times a day (TID) | ORAL | Status: DC
  Administered 2011-08-26 – 2011-08-28 (×5): 5 mg via ORAL
  Filled 2011-08-26 (×9): qty 1

## 2011-08-26 MED ORDER — GABAPENTIN 300 MG PO CAPS
300.0000 mg | ORAL_CAPSULE | Freq: Three times a day (TID) | ORAL | Status: DC
  Administered 2011-08-26 – 2011-08-29 (×9): 300 mg via ORAL
  Filled 2011-08-26 (×11): qty 1

## 2011-08-26 NOTE — ED Notes (Signed)
Pt. Assisted to bedside commode without incident. Pt. Assisted back to bed without incident.

## 2011-08-26 NOTE — Progress Notes (Signed)
  Echocardiogram 2D Echocardiogram has been performed.  [place]ian Co 08/26/2011, 4:23 PM

## 2011-08-26 NOTE — ED Notes (Signed)
Admitting MD in to see pt.

## 2011-08-26 NOTE — H&P (Signed)
[facility] History and Physical  Tina Saunders:[id number] DOB: Aug 15, 1985 DOA: 08/25/2011  Referring physician:  PCP: Sheila Oats, MD   Chief Complaint:  Chest Pain and Pain All over  HPI:  Tina Saunders is an 26 y.o. Female who presents with complaints of Chest Pain, and SOB and pain all over since the AM.  She was evaluated in the ED and underwent a CTA of the Chest due to her symptoms and a previous history of a DVT at the age of 36.  She denies any fevers or chills, she does report having nausea but no vomiting.  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    Review of Systems:  The patient denies anorexia, fever, weight loss, vision loss, decreased hearing, hoarseness, chest pain, syncope, dyspnea on exertion, peripheral edema, balance deficits, hemoptysis, abdominal pain, melena, hematochezia, severe indigestion/heartburn, hematuria, incontinence, genital sores, muscle weakness, suspicious skin lesions, transient blindness, difficulty walking, depression, unusual weight change, abnormal bleeding, enlarged lymph nodes, angioedema, and breast masses.    Past Medical History  Diagnosis Date  . Cancer of abdominal wall   . Polycystic ovarian syndrome 07/01/2011    Patient report  . Rhabdosarcoma    Past Surgical History  Procedure Date  . Cholecystectomy   . Varicose vein surgery   . Ovarian cyst excision    Prior to Admission medications   Medication Sig Start Date End Date Taking? Authorizing Provider  gabapentin (NEURONTIN) 300 MG capsule Take 300 mg by mouth 3 (three) times daily.   Yes Historical Provider, MD  metoCLOPramide (REGLAN) 5 MG tablet Take 5 mg by mouth 3 (three) times daily before meals.   Yes Historical Provider, MD  promethazine (PHENERGAN) 25 MG suppository Place 25 mg rectally every 6 (six) hours as needed. 06/30/11 09/24/11 Yes Lyanne Co, MD  QUEtiapine (SEROQUEL) 300 MG tablet Take 300 mg by mouth at bedtime.   Yes Historical Provider, MD  sertraline (ZOLOFT) 50 MG tablet Take 50 mg by mouth daily.   Yes Historical Provider, MD   Allergies  Allergen Reactions  . Morphine And Related Hives, Itching and Other (See Comments)    GI upset    Social History:  reports that she has never smoked. She does not have any smokeless tobacco history on file. She reports that she does not drink alcohol or use illicit drugs.    Family History  Problem Relation Age of Onset  . Coronary artery disease Maternal Grandmother   . Diabetes type II Maternal Grandmother   . Cancer Maternal Grandmother   . Hypertension Mother   . Hypertension Father        Physical Exam:  GEN:  Morbidly Obese 26 year old Caucasian Female examined  and in no acute distress; cooperative with exam Filed Vitals:   08/25/11 2100 08/25/11 2115 08/25/11 2200 08/25/11 2300  BP: 106/73 112/82 110/48 119/54  Pulse: 130 123 123 128  Temp:      TempSrc:      Resp: 26 26 24 25   SpO2: 95% 98% 96% 94%   Blood pressure 119/54, pulse 128, temperature 100.4 F (38 C), temperature source Oral, resp. rate 25, last menstrual period 08/21/2011, SpO2 94.00%. PSYCH: She is alert and oriented x4; does not appear anxious does not appear depressed; affect is normal HEENT: Normocephalic and Atraumatic, Mucous membranes pink; PERRLA; EOM intact; Fundi:  Benign;  No scleral icterus, Nares: Patent, Oropharynx: Clear, Fair Dentition, Neck:  FROM, no cervical lymphadenopathy nor thyromegaly or carotid bruit; no JVD; Breasts:: Not examined CHEST WALL: No tenderness CHEST: Normal respiration, clear to auscultation bilaterally HEART: Regular rate and rhythm; no murmurs rubs or gallops BACK: No kyphosis or scoliosis; no CVA tenderness ABDOMEN: Positive Bowel Sounds, Obese, soft non-tender; no masses, no organomegaly, no pannus; no intertriginous candida. Rectal Exam: Not  done EXTREMITIES: No bone or joint deformity; age-appropriate arthropathy of the hands and knees; no cyanosis, clubbing or edema; no ulcerations. Genitalia: not examined PULSES: 2+ and symmetric SKIN: Normal hydration no rash or ulceration CNS: Cranial nerves 2-12 grossly intact no focal neurologic deficit   Labs on Admission:  Basic Metabolic Panel:  Lab 08/25/11 2130  NA 137  K 4.1  CL 103  CO2 22  GLUCOSE 114*  BUN 7  CREATININE 0.58  CALCIUM 9.0  MG --  PHOS --   Liver Function Tests: No results found for this basename: AST:5,ALT:5,ALKPHOS:5,BILITOT:5,PROT:5,ALBUMIN:5 in the last 168 hours No results found for this basename: LIPASE:5,AMYLASE:5 in the last 168 hours No results found for this basename: AMMONIA:5 in the last 168 hours CBC:  Lab 08/25/11 1721  WBC 9.7  NEUTROABS --  HGB 13.8  HCT 40.3  MCV 83.4  PLT 213   Cardiac Enzymes:  Lab 08/25/11 1804  CKTOTAL 49  CKMB 1.2  CKMBINDEX --  TROPONINI <0.30    BNP (last 3 results) No results found for this basename: PROBNP:3 in the last 8760 hours CBG: No results found for this basename: GLUCAP:5 in the last 168 hours  Radiological Exams on Admission: Ct Angio Chest Pe W/cm &/or Wo Cm  08/25/2011  *RADIOLOGY REPORT*  Clinical Data: 26 year old female with shortness of breath. History of sarcoma.  CT ANGIOGRAPHY CHEST  Technique:  Multidetector CT imaging of the chest using the standard protocol during bolus administration of intravenous contrast. Multiplanar reconstructed images including MIPs were obtained and reviewed to evaluate the vascular anatomy.  Contrast:95 ml intravenous Omnipaque-300  Comparison: 07/01/2011 CT  Findings: This is a technically limited study secondary to mild respiratory motion artifact and suboptimal contrast opacification.  No large or central pulmonary emboli are identified. There is no evidence of thoracic aortic aneurysm. Upper limits normal heart size noted.  Tiny bilateral pleural  effusions and bibasilar atelectasis noted. There is no evidence of pericardial effusion or enlarged lymph nodes.  Very mild central ground-glass opacities are noted and may represent mild edema. There is no evidence of airspace disease, nodule, mass, or endobronchial/endotracheal lesion. The visualized upper abdomen is unremarkable. No acute or suspicious bony abnormalities are present.  IMPRESSION: Suboptimal evaluation for pulmonary emboli, but no large or central pulmonary emboli identified.  Mild central ground-glass opacities which could represent mild interstitial edema.  Tiny bilateral pleural effusions and mild bibasilar atelectasis.  Original Report Authenticated By: Rosendo Gros, M.D.   Dg Chest Port 1 View  08/25/2011  *RADIOLOGY REPORT*  Clinical Data: Shortness of breath and chest pressure  PORTABLE CHEST - 1 VIEW  Comparison: 07/01/2011  Findings: The heart size and mediastinal contours are within normal limits.  Both lungs are clear.  The visualized skeletal structures are unremarkable.  IMPRESSION: No active disease.  Original Report Authenticated By: Rosealee Albee, M.D.    EKG: Independently reviewed.   SinusTachycardia, No acute ST changes  Assessment: Principal Problem:  *Chest pain Active Problems:  SOB (shortness of breath)  POLYCYSTIC OVARIAN DISEASE  Obesity, unspecified   Plan:    Admit to Telemetry Cardiac Enzymes Reconcile Home Medications Pain control DVT prophylaxis   Code Status: FULL CODE Family Communication: N/A Disposition Plan: Return Home  Time spent: 60 minutes  Ron Parker [facility] Pager [phone]  If 7PM-7AM, please contact night-coverage [email or web address] Password [email or web address] 08/26/2011, 1:00 AM    DATE OF ADMISSION:  08/26/2011  PCP:    Sheila Oats, MD   Chief Complaint:    HPI:

## 2011-08-26 NOTE — Progress Notes (Signed)
*  PRELIMINARY RESULTS* Vascular Ultrasound Lower extremity venous duplex has been completed.  Preliminary findings: Bilaterally no evidence of DVT or baker's cyst.  Farrel Demark, RDMS, RVT 08/26/2011, 10:00 AM

## 2011-08-26 NOTE — Progress Notes (Signed)
PCP: Does not have a PCP  Brief HPI:  Tina Saunders is an 26 y.o. Female who presents with complaints of Chest Pain, and SOB and pain all over since morning prior to admission. She was evaluated in the ED and underwent a CTA of the Chest due to her symptoms and a previous history of a DVT at the age of 21. She denied any fevers or chills. She did report having nausea but no vomiting. 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 This was apparently diagnosed in [place].  Past medical history:  Past Medical History  Diagnosis Date  . Cancer of abdominal wall   . Polycystic ovarian syndrome 07/01/2011    Patient report  . Rhabdosarcoma     Consultants: None  Procedures: None  Subjective: Patient was admitted this morning. H& reviewed. Patient complaining of pain all over. Says she has a cough with greenish expectoration. Also complaining of 8/10 chest pain which worsens with deep breathing.  Objective: Vital signs in last 24 hours: Temp:  [99.9 F (37.7 C)-103 F (39.4 C)] 99.9 F (37.7 C) (08/03 0610) Pulse Rate:  [101-166] 101  (08/03 0610) Resp:  [20-27] 20  (08/03 0610) BP: (92-123)/(48-101) 92/56 mmHg (08/03 0610) SpO2:  [94 %-98 %] 96 % (08/03 0610) Weight:  [113 kg (249 lb 1.9 oz)] 113 kg (249 lb 1.9 oz) (08/03 0200) Weight change:  Last BM Date: 08/25/11  Intake/Output from previous day: 08/02 0701 - 08/03 0700 In: 2000 [I.V.:2000] Out: -  Intake/Output this shift:    General appearance: alert, cooperative, appears stated age, no distress and morbidly obese Head: Normocephalic, without obvious abnormality, atraumatic Back: symmetric, no curvature. ROM normal. No CVA tenderness. Resp: decreased air entry at the bases. No wheezing. no definite crackles Cardio: regular rate and rhythm, S1, S2 normal, no murmur, click, rub or gallop GI: soft, diffusely  tender without rebound rigidity or guarding; bowel sounds normal; no masses,  no organomegaly Extremities: extremities normal, atraumatic, no cyanosis or edema Pulses: 2+ and symmetric Skin: Skin color, texture, turgor normal. No rashes or lesions Lymph nodes: Cervical, supraclavicular, and axillary nodes normal. Neurologic: Alert, oriented x 3. No focal deficits.  Lab Results:  Basename 08/26/11 0555 08/26/11 0048  WBC 8.4 10.6*  HGB 11.5* 12.5  HCT 34.5* 36.5  PLT 194 196   BMET  Basename 08/26/11 0555 08/26/11 0048 08/25/11 1721  NA 135 -- 137  K 3.4* -- 4.1  CL 101 -- 103  CO2 25 -- 22  GLUCOSE 110* -- 114*  BUN 7 -- 7  CREATININE 0.57 0.61 --  CALCIUM 8.1* -- 9.0  ALT -- -- --    Studies/Results: Ct Angio Chest Pe W/cm &/or Wo Cm  08/25/2011  *RADIOLOGY REPORT*  Clinical Data: 26 year old female with shortness of breath. History of sarcoma.  CT ANGIOGRAPHY CHEST  Technique:  Multidetector CT imaging of the chest using the standard protocol during bolus administration of intravenous contrast. Multiplanar reconstructed images including MIPs were obtained and reviewed to evaluate the vascular anatomy.  Contrast:95 ml intravenous Omnipaque-300  Comparison: 07/01/2011 CT  Findings: This is a technically limited study secondary to mild respiratory motion artifact and suboptimal contrast opacification.  No large or central pulmonary emboli are identified. There is no evidence of thoracic aortic aneurysm. Upper limits normal heart size noted.  Tiny bilateral pleural effusions and bibasilar atelectasis  noted. There is no evidence of pericardial effusion or enlarged lymph nodes.  Very mild central ground-glass opacities are noted and may represent mild edema. There is no evidence of airspace disease, nodule, mass, or endobronchial/endotracheal lesion. The visualized upper abdomen is unremarkable. No acute or suspicious bony abnormalities are present.  IMPRESSION: Suboptimal evaluation for  pulmonary emboli, but no large or central pulmonary emboli identified.  Mild central ground-glass opacities which could represent mild interstitial edema.  Tiny bilateral pleural effusions and mild bibasilar atelectasis.  Original Report Authenticated By: Rosendo Gros, M.D.   Dg Chest Port 1 View  08/25/2011  *RADIOLOGY REPORT*  Clinical Data: Shortness of breath and chest pressure  PORTABLE CHEST - 1 VIEW  Comparison: 07/01/2011  Findings: The heart size and mediastinal contours are within normal limits.  Both lungs are clear.  The visualized skeletal structures are unremarkable.  IMPRESSION: No active disease.  Original Report Authenticated By: Rosealee Albee, M.D.    Medications:  Scheduled:    . aspirin EC  81 mg Oral Daily  . enoxaparin (LOVENOX) injection  40 mg Subcutaneous Q24H  . gabapentin  300 mg Oral Once  . gabapentin  300 mg Oral TID  .  HYDROmorphone (DILAUDID) injection  0.5 mg Intravenous Once  .  HYDROmorphone (DILAUDID) injection  0.5 mg Intravenous Once  . levofloxacin (LEVAQUIN) IV  750 mg Intravenous Q24H  . metoCLOPramide  5 mg Oral TID AC  . ondansetron  4 mg Intravenous Once  . potassium chloride  40 mEq Oral Once  . QUEtiapine  300 mg Oral Once  . QUEtiapine  300 mg Oral QHS  . sertraline  50 mg Oral Daily  . sodium chloride  1,000 mL Intravenous Once  . sodium chloride  1,000 mL Intravenous Once  . sodium chloride  500 mL Intravenous Once  . DISCONTD: aspirin EC  325 mg Oral Daily  . DISCONTD: gabapentin  300 mg Oral Once  . DISCONTD: sodium chloride  3 mL Intravenous Q12H   Continuous:    . sodium chloride 100 mL/hr at 08/26/11 0120   ZOX:[id number], acetaminophen, alum & mag hydroxide-simeth, HYDROmorphone (DILAUDID) injection, iohexol, ondansetron (ZOFRAN) IV, ondansetron, oxyCODONE, zolpidem  Assessment/Plan:  Principal Problem:  *Fever Active Problems:  POLYCYSTIC OVARIAN DISEASE  Obesity, unspecified  Chest pain  SOB (shortness of  breath)  Hypokalemia  PSVT (paroxysmal supraventricular tachycardia)    Fever Etiology not entirely clear. CT does show ground glass opacities with pleural effusions. This could be suggestive of an infectious process. Will obtain blood cultures. Start Levaquin. Check strep and legionella ag in urine. Urine cultures. Check lactic acid as her BP is soft. Check LFT's.  Borderline Hypotension Could be from dehydration or sepsis. Monitor closely. Check lactic acid. Give fluid bolus. Low threshold for transfer to step down.  Chest Pain Could be from the pulmonary process. Cardiac enzymes are negative. Will get ECHO. Ct was neg for PE. Aspirin  Paroxysmal SVT Probably from fever. Continue to monitor. TSH is pending. ECHO.  Shortness of Breath Probably from the process seen on CT. Treat with Levaquin. Nebs PRN  Morbid Obesity Will need weight loss counseling at some point  Hypokalemia Replete  Code Status Full Code  DVT Prophylaxis Enoxaparin     LOS: 1 day   [facility]  [facility] Pager [phone] 08/26/2011, 12:03 PM

## 2011-08-27 DIAGNOSIS — J189 Pneumonia, unspecified organism: Secondary | ICD-10-CM | POA: Diagnosis present

## 2011-08-27 DIAGNOSIS — E669 Obesity, unspecified: Secondary | ICD-10-CM

## 2011-08-27 DIAGNOSIS — R509 Fever, unspecified: Secondary | ICD-10-CM

## 2011-08-27 DIAGNOSIS — I471 Supraventricular tachycardia: Secondary | ICD-10-CM

## 2011-08-27 LAB — COMPREHENSIVE METABOLIC PANEL
Albumin: 2.8 g/dL — ABNORMAL LOW (ref 3.5–5.2)
BUN: 4 mg/dL — ABNORMAL LOW (ref 6–23)
Creatinine, Ser: 0.44 mg/dL — ABNORMAL LOW (ref 0.50–1.10)
GFR calc Af Amer: >90 mL/min (ref >90)
Glucose, Bld: 109 mg/dL — ABNORMAL HIGH (ref 70–99)
Total Bilirubin: 0.2 mg/dL — ABNORMAL LOW (ref 0.3–1.2)
Total Protein: 6.3 g/dL (ref 6.0–8.3)

## 2011-08-27 LAB — CBC
HCT: 35 % — ABNORMAL LOW (ref 36.0–46.0)
Hemoglobin: 11.5 g/dL — ABNORMAL LOW (ref 12.0–15.0)
MCHC: 32.9 g/dL (ref 30.0–36.0)
MCV: 86 fL (ref 78.0–100.0)
RDW: 13.9 % (ref 11.5–15.5)

## 2011-08-27 LAB — STREP PNEUMONIAE URINARY ANTIGEN: Strep Pneumo Urinary Antigen: NEGATIVE

## 2011-08-27 MED ORDER — CEPASTAT 14.5 MG MT LOZG
1.0000 | LOZENGE | OROMUCOSAL | Status: DC | PRN
  Filled 2011-08-27: qty 18

## 2011-08-27 MED ORDER — PHENOL 1.4 % MT LIQD
1.0000 | OROMUCOSAL | Status: DC | PRN
  Administered 2011-08-27: 1 via OROMUCOSAL
  Filled 2011-08-27: qty 177

## 2011-08-27 MED ORDER — MENTHOL 3 MG MT LOZG
1.0000 | LOZENGE | OROMUCOSAL | Status: DC | PRN
  Administered 2011-08-27: 3 mg via ORAL
  Filled 2011-08-27: qty 9

## 2011-08-27 NOTE — Progress Notes (Signed)
   CARE MANAGEMENT NOTE 08/27/2011  Patient:  Tina Saunders, Tina Saunders   Account Number:  [id number]  Date Initiated:  08/27/2011  Documentation initiated by:  [facility]  Subjective/Objective Assessment:   Fever, Hypotension, Chest pain     Action/Plan:   Anticipated DC Date:  08/28/2011   Anticipated DC Plan:  HOME/SELF CARE      DC Planning Services  CM consult  [facility]      Choice offered to / List presented to:             Status of service:  In process, will continue to follow Medicare Important Message given?   (If response is "NO", the following Medicare IM given date fields will be blank) Date Medicare IM given:   Date Additional Medicare IM given:    Discharge Disposition:    Per UR Regulation:    If discussed at Long Length of Stay Meetings, dates discussed:    Comments:  08/27/2011 1100 Spoke to pt and states she goes to [facility] on [address] for mental health. Has appt on 9/4 and will be assigned a physician at clinic on that day. NCM will continue to follow up. Isidoro Donning RN CCM Case Mgmt phone [phone]

## 2011-08-27 NOTE — Progress Notes (Signed)
PCP: Does not have a PCP  Brief HPI:  Tina Saunders is an 26 y.o. Female who presents with complaints of Chest Pain, and SOB and pain all over since morning prior to admission. She was evaluated in the ED and underwent a CTA of the Chest due to her symptoms and a previous history of a DVT at the age of 31. She denied any fevers or chills. She did report having nausea but no vomiting. 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 This was apparently diagnosed in [place].  Past medical history:  Past Medical History  Diagnosis Date  . Cancer of abdominal wall   . Polycystic ovarian syndrome 07/01/2011    Patient report  . Rhabdosarcoma     Consultants: None  Procedures: None  Subjective: Patient still complaining of pain all over. Also complaining on pain in her throat. CP is better. Some shortness of breath with cough with greenish sputum.   Objective: Vital signs in last 24 hours: Temp:  [97.2 F (36.2 C)-99 F (37.2 C)] 98.3 F (36.8 C) (08/04 0540) Pulse Rate:  [95-101] 98  (08/04 0540) Resp:  [18-20] 18  (08/04 0540) BP: (105-126)/(61-88) 126/72 mmHg (08/04 0540) SpO2:  [96 %-99 %] 99 % (08/04 0540) Weight change:  Last BM Date: 08/25/11  Intake/Output from previous day: 08/03 0701 - 08/04 0700 In: 240 [P.O.:240] Out: 800 [Urine:800] Intake/Output this shift:    General appearance: alert, cooperative, appears stated age, no distress and morbidly obese Head: Normocephalic, without obvious abnormality, atraumatic Throat: Possible inflamed tonsils. No erythema in pharyngeal area. Back: symmetric, no curvature. ROM normal. No CVA tenderness. Resp: Improved air entry bilaterally. No wheezing. no definite crackles Cardio: regular rate and rhythm, S1, S2 normal, no murmur, click, rub or gallop GI: soft, Not much tenderness today; bowel sounds normal; no masses,   no organomegaly Extremities: extremities normal, atraumatic, no cyanosis or edema Pulses: 2+ and symmetric Skin: Skin color, texture, turgor normal. No rashes or lesions Lymph nodes: Cervical, supraclavicular, and axillary nodes normal. Neurologic: Alert, oriented x 3. No focal deficits.  Lab Results:  Basename 08/27/11 0555 08/26/11 0555  WBC 7.3 8.4  HGB 11.5* 11.5*  HCT 35.0* 34.5*  PLT 182 194   BMET  Basename 08/27/11 0555 08/26/11 1312 08/26/11 0555  NA 134* -- 135  K 4.1 -- 3.4*  CL 100 -- 101  CO2 26 -- 25  GLUCOSE 109* -- 110*  BUN 4* -- 7  CREATININE 0.44* -- 0.57  CALCIUM 8.3* -- 8.1*  ALT 33 28 --    Studies/Results: Ct Angio Chest Pe W/cm &/or Wo Cm  08/25/2011  *RADIOLOGY REPORT*  Clinical Data: 26 year old female with shortness of breath. History of sarcoma.  CT ANGIOGRAPHY CHEST  Technique:  Multidetector CT imaging of the chest using the standard protocol during bolus administration of intravenous contrast. Multiplanar reconstructed images including MIPs were obtained and reviewed to evaluate the vascular anatomy.  Contrast:95 ml intravenous Omnipaque-300  Comparison: 07/01/2011 CT  Findings: This is a technically limited study secondary to mild respiratory motion artifact and suboptimal contrast opacification.  No large or central pulmonary emboli are identified. There is no evidence of thoracic aortic aneurysm. Upper limits normal heart size noted.  Tiny bilateral pleural effusions and bibasilar atelectasis noted. There is no evidence of pericardial effusion or enlarged lymph nodes.  Very mild central ground-glass  opacities are noted and may represent mild edema. There is no evidence of airspace disease, nodule, mass, or endobronchial/endotracheal lesion. The visualized upper abdomen is unremarkable. No acute or suspicious bony abnormalities are present.  IMPRESSION: Suboptimal evaluation for pulmonary emboli, but no large or central pulmonary emboli identified.  Mild  central ground-glass opacities which could represent mild interstitial edema.  Tiny bilateral pleural effusions and mild bibasilar atelectasis.  Original Report Authenticated By: Rosendo Gros, M.D.   Dg Chest Port 1 View  08/25/2011  *RADIOLOGY REPORT*  Clinical Data: Shortness of breath and chest pressure  PORTABLE CHEST - 1 VIEW  Comparison: 07/01/2011  Findings: The heart size and mediastinal contours are within normal limits.  Both lungs are clear.  The visualized skeletal structures are unremarkable.  IMPRESSION: No active disease.  Original Report Authenticated By: Rosealee Albee, M.D.    Medications:  Scheduled:    . aspirin EC  81 mg Oral Daily  . enoxaparin (LOVENOX) injection  40 mg Subcutaneous Q24H  . gabapentin  300 mg Oral TID  . levofloxacin (LEVAQUIN) IV  750 mg Intravenous Q24H  . metoCLOPramide  5 mg Oral TID AC  . potassium chloride  40 mEq Oral Once  . QUEtiapine  300 mg Oral QHS  . sertraline  50 mg Oral Daily  . sodium chloride  500 mL Intravenous Once  . DISCONTD: aspirin EC  325 mg Oral Daily  . DISCONTD: sodium chloride  3 mL Intravenous Q12H   Continuous:    . sodium chloride 100 mL/hr (08/27/11 1610)   RUE:[id number], acetaminophen, alum & mag hydroxide-simeth, HYDROmorphone (DILAUDID) injection, levalbuterol, ondansetron (ZOFRAN) IV, ondansetron, oxyCODONE, zolpidem  Assessment/Plan:  Principal Problem:  *Fever Active Problems:  POLYCYSTIC OVARIAN DISEASE  Obesity, unspecified  Chest pain  SOB (shortness of breath)  Hypokalemia  PSVT (paroxysmal supraventricular tachycardia)    Fever Possibly from either a viral syndrome or pneumonia. Afebrile now. Continue Levaquin. Blood and Urine cultures pending. Lactic acid was normal. LFT's are normal.  Sore Throat Possible tonsillitis. No erythema noted in back of throat. Continue levaquin. Is able to tolerate orally. Throat lozenges.  Borderline Hypotension Improved with IV fluids. Could be  from dehydration or sepsis. Monitor closely.   Chest Pain Could be from the pulmonary process. Cardiac enzymes are negative. ECHO report is pending. Ct was neg for PE. Aspirin.  Paroxysmal SVT Probably from fever/acute illness. No further episodes. Continue to monitor. TSH is normal. ECHO report is pending. Hold off on BB for now.  Shortness of Breath Probably from the process seen on CT. Treat with Levaquin. Nebs PRN  Morbid Obesity Will need weight loss counseling at some point.  Hypokalemia Repleted  Code Status Full Code  DVT Prophylaxis Enoxaparin  Disposition: Home when stable. Mobilize. Check RA sats    LOS: 2 days   [facility]  [facility] Pager [phone] 08/27/2011, 8:20 AM

## 2011-08-27 NOTE — Progress Notes (Signed)
Patient sats 93-94% on room air while ambulating in room. Pt. In chair at this time.

## 2011-08-28 DIAGNOSIS — J189 Pneumonia, unspecified organism: Principal | ICD-10-CM

## 2011-08-28 LAB — URINE CULTURE: Colony Count: 25000

## 2011-08-28 LAB — BASIC METABOLIC PANEL
Calcium: 8.4 mg/dL (ref 8.4–10.5)
GFR calc Af Amer: >90 mL/min (ref >90)
GFR calc non Af Amer: >90 mL/min (ref >90)
Glucose, Bld: 86 mg/dL (ref 70–99)
Potassium: 3.6 mEq/L (ref 3.5–5.1)
Sodium: 137 mEq/L (ref 135–145)

## 2011-08-28 LAB — LEGIONELLA ANTIGEN, URINE: Legionella Antigen, Urine: NEGATIVE

## 2011-08-28 MED ORDER — LEVOFLOXACIN 750 MG PO TABS
750.0000 mg | ORAL_TABLET | ORAL | Status: DC
  Administered 2011-08-28 – 2011-08-29 (×2): 750 mg via ORAL
  Filled 2011-08-28 (×2): qty 1

## 2011-08-28 MED ORDER — DIPHENHYDRAMINE HCL 25 MG PO CAPS
25.0000 mg | ORAL_CAPSULE | ORAL | Status: AC
  Administered 2011-08-28: 25 mg via ORAL
  Filled 2011-08-28: qty 1

## 2011-08-28 MED ORDER — METOCLOPRAMIDE HCL 5 MG PO TABS
5.0000 mg | ORAL_TABLET | Freq: Three times a day (TID) | ORAL | Status: DC
  Administered 2011-08-28 – 2011-08-29 (×5): 5 mg via ORAL
  Filled 2011-08-28 (×8): qty 1

## 2011-08-28 MED ORDER — POLYETHYLENE GLYCOL 3350 17 G PO PACK
17.0000 g | PACK | Freq: Every day | ORAL | Status: DC | PRN
  Administered 2011-08-28: 17 g via ORAL
  Filled 2011-08-28: qty 1

## 2011-08-28 MED ORDER — PANTOPRAZOLE SODIUM 40 MG PO TBEC
40.0000 mg | DELAYED_RELEASE_TABLET | Freq: Every morning | ORAL | Status: DC
  Administered 2011-08-29: 40 mg via ORAL
  Filled 2011-08-28: qty 1

## 2011-08-28 MED ORDER — PANTOPRAZOLE SODIUM 40 MG IV SOLR
40.0000 mg | Freq: Once | INTRAVENOUS | Status: AC
  Administered 2011-08-28: 40 mg via INTRAVENOUS
  Filled 2011-08-28: qty 40

## 2011-08-28 NOTE — Care Management Note (Addendum)
    Page 1 of 1   08/29/2011     1:47:51 PM   CARE MANAGEMENT NOTE 08/29/2011  Patient:  Tina Saunders, Tina Saunders   Account Number:  [id number]  Date Initiated:  08/27/2011  Documentation initiated by:  [facility]  Subjective/Objective Assessment:   Fever, Hypotension, Chest pain     Action/Plan:   PTA, PT INDEPENDENT, LIVES WITH BROTHER AND SISTER IN LAW.   Anticipated DC Date:  08/28/2011   Anticipated DC Plan:  HOME/SELF CARE      DC Planning Services  CM consult  [facility]      Choice offered to / List presented to:             Status of service:  Completed, signed off Medicare Important Message given?   (If response is "NO", the following Medicare IM given date fields will be blank) Date Medicare IM given:   Date Additional Medicare IM given:    Discharge Disposition:  HOME/SELF CARE  Per UR Regulation:  Reviewed for med. necessity/level of care/duration of stay  If discussed at Long Length of Stay Meetings, dates discussed:    Comments:  08/29/11 Tina Vinton,RN,BSN 1030 PT FOR DC HOME TODAY.  GIVEN LIST OF PRIMARY CARE RESOURCES FOR FOLLOW UP, THOUGH PT STATES SHE NEEDS "FREE" MEDICAL CARE. SOME OF THOSE LISTED ARE ON SLIDING SCALE, BUT ARE NOT FREE, AND I EXPLAINED THIS TO PT.  SHE STATES SHE PLANS TO APPLY FOR MEDICAID SOON.  08/27/2011 1100 Spoke to pt and states she goes to [facility] on [address] for mental health. Has appt on 9/4 and will be assigned a physician at clinic on that day. NCM will continue to follow up. Tina Donning RN CCM Case Mgmt phone [phone]

## 2011-08-28 NOTE — Plan of Care (Signed)
Problem: Phase II Progression Outcomes Goal: Progress activity as tolerated unless otherwise ordered Outcome: Completed/Met Date Met:  08/28/11 Pt ambulating in hallway

## 2011-08-28 NOTE — Progress Notes (Signed)
PCP: Does not have a PCP yet  Brief HPI:  Tina Saunders is an 26 y.o. Female who presents with complaints of Chest Pain, and SOB and pain all over since morning prior to admission. She was evaluated in the ED and underwent a CTA of the Chest due to her symptoms and a previous history of a DVT at the age of 27. She denied any fevers or chills. She did report having nausea but no vomiting. 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 This was apparently diagnosed in [place].  Past medical history:  Past Medical History  Diagnosis Date  . Cancer of abdominal wall   . Polycystic ovarian syndrome 07/01/2011    Patient report  . Rhabdosarcoma     Consultants: None  Procedures: None  Subjective: Patient had one episode of vomiting overnight. None since then. Gives history of GERD. Not on PPI for unclear reasons. Other symptoms asre better.   Objective: Vital signs in last 24 hours: Temp:  [97 F (36.1 C)-97.3 F (36.3 C)] 97.3 F (36.3 C) (08/05 0509) Pulse Rate:  [94-96] 96  (08/05 0509) Resp:  [18] 18  (08/05 0509) BP: (95-127)/(62-83) 127/83 mmHg (08/05 0621) SpO2:  [92 %-95 %] 95 % (08/05 0509) Weight change:  Last BM Date: 08/25/11  Intake/Output from previous day: 08/04 0701 - 08/05 0700 In: 3416.7 [P.O.:480; I.V.:2936.7] Out: 2000 [Urine:2000] Intake/Output this shift:    General appearance: alert, cooperative, appears stated age, no distress and morbidly obese Throat: Possible inflamed tonsils. No erythema in pharyngeal area. Resp: Improved air entry bilaterally. No wheezing. no definite crackles Cardio: regular rate and rhythm, S1, S2 normal, no murmur, click, rub or gallop GI: soft, Non tender; bowel sounds normal; no masses,  no organomegaly Extremities: extremities normal, atraumatic, no cyanosis or edema Pulses: 2+ and symmetric Neurologic: Alert,  oriented x 3. No focal deficits.  Lab Results:  Basename 08/27/11 0555 08/26/11 0555  WBC 7.3 8.4  HGB 11.5* 11.5*  HCT 35.0* 34.5*  PLT 182 194   BMET  Basename 08/27/11 0555 08/26/11 1312 08/26/11 0555  NA 134* -- 135  K 4.1 -- 3.4*  CL 100 -- 101  CO2 26 -- 25  GLUCOSE 109* -- 110*  BUN 4* -- 7  CREATININE 0.44* -- 0.57  CALCIUM 8.3* -- 8.1*  ALT 33 28 --    Studies/Results: No results found.  Medications:  Scheduled:    . aspirin EC  81 mg Oral Daily  . enoxaparin (LOVENOX) injection  40 mg Subcutaneous Q24H  . gabapentin  300 mg Oral TID  . levofloxacin (LEVAQUIN) IV  750 mg Intravenous Q24H  . metoCLOPramide  5 mg Oral TID AC  . QUEtiapine  300 mg Oral QHS  . sertraline  50 mg Oral Daily   Continuous:    . sodium chloride 100 mL/hr (08/28/11 0100)   OZH:YQMVHQIONGEXB, acetaminophen, alum & mag hydroxide-simeth, HYDROmorphone (DILAUDID) injection, levalbuterol, menthol-cetylpyridinium, ondansetron (ZOFRAN) IV, ondansetron, oxyCODONE, phenol, zolpidem, DISCONTD: phenol-menthol  Assessment/Plan:  Principal Problem:  *CAP (community acquired pneumonia) Active Problems:  POLYCYSTIC OVARIAN DISEASE  Obesity, unspecified  Chest pain  SOB (shortness of breath)  Fever  Hypokalemia  PSVT (paroxysmal supraventricular tachycardia)    Fever/CAP Possibly from either a viral syndrome or pneumonia. Afebrile now. Continue Levaquin. Change to oral. Blood and Urine cultures negative so far. Lactic acid was normal.  LFT's are normal.  Sore Throat Possible tonsillitis. No erythema noted in back of throat. Continue levaquin. Throat lozenges.  Single episode of Vomiting Add nighttime dose of Reglan as well. Add PPI. May need to be seen by GI at some point.  Borderline Hypotension Resolved. Could have been from dehydration or sepsis. Improved with IV fluids.  Chest Pain Could be from the pulmonary process. Cardiac enzymes are negative. ECHO showed normal EF  without wall motion abnormalities. Ct was neg for PE. Can stop Aspirin as there is no clear indication for same.  Paroxysmal SVT Probably from fever/acute illness. No further episodes. Continue to monitor. TSH is normal. ECHO report reviewed. Hold off on BB for now.  Shortness of Breath Improved. Probably from the process seen on CT. Treat with Levaquin. Nebs PRN  Morbid Obesity Will need weight loss counseling at some point.  Hypokalemia Repleted  Code Status Full Code  DVT Prophylaxis Enoxaparin  Disposition: Home when stable. Mobilize in hallway. Possible discharge in AM.    LOS: 3 days   [facility]  [facility] Pager [phone] 08/28/2011, 7:19 AM

## 2011-08-28 NOTE — Progress Notes (Addendum)
Pt  Had a bloody nose.  I applied pressure for 5 minutes.  Clotted off and patient instructed to refrain from blowing nose or picking at it. Pt resting at this time without issue.  Will continue to monitor. Thomas Hoff

## 2011-08-28 NOTE — Progress Notes (Signed)
Utilization review complete 

## 2011-08-28 NOTE — Progress Notes (Signed)
Spoke with patient concerning Hand, Foot, and Mouth disease.  Tina Saunders called and said a child she had been watching was diagnosed with it.  She said her throat was hurting and could that be a sign.  I printed some education materials for her on the disease and explained how it is contracted. She has throat lozenges for sore throat. Pt resting at this time  Call bell within reach. Thomas Hoff

## 2011-08-29 LAB — BASIC METABOLIC PANEL
BUN: 5 mg/dL — ABNORMAL LOW (ref 6–23)
Chloride: 102 mEq/L (ref 96–112)
GFR calc Af Amer: >90 mL/min (ref >90)
GFR calc non Af Amer: >90 mL/min (ref >90)
Potassium: 3.3 mEq/L — ABNORMAL LOW (ref 3.5–5.1)
Sodium: 140 mEq/L (ref 135–145)

## 2011-08-29 MED ORDER — METOCLOPRAMIDE HCL 5 MG PO TABS: 5.0000 mg | ORAL_TABLET | Freq: Three times a day (TID) | ORAL | Status: DC

## 2011-08-29 MED ORDER — POTASSIUM CHLORIDE CRYS ER 20 MEQ PO TBCR
40.0000 meq | EXTENDED_RELEASE_TABLET | Freq: Once | ORAL | Status: AC
  Administered 2011-08-29: 40 meq via ORAL
  Filled 2011-08-29 (×2): qty 2

## 2011-08-29 MED ORDER — POTASSIUM CHLORIDE CRYS ER 20 MEQ PO TBCR
40.0000 meq | EXTENDED_RELEASE_TABLET | Freq: Once | ORAL | Status: AC
  Administered 2011-08-29: 40 meq via ORAL
  Filled 2011-08-29: qty 2

## 2011-08-29 MED ORDER — LEVOFLOXACIN 750 MG PO TABS: 750.0000 mg | ORAL_TABLET | ORAL | Status: AC

## 2011-08-29 MED ORDER — GABAPENTIN 300 MG PO CAPS: 300.0000 mg | ORAL_CAPSULE | Freq: Three times a day (TID) | ORAL | Status: DC

## 2011-08-29 MED ORDER — OMEPRAZOLE 20 MG PO CPDR: 20.0000 mg | DELAYED_RELEASE_CAPSULE | Freq: Every day | ORAL | Status: DC

## 2011-08-29 MED ORDER — ALBUTEROL SULFATE HFA 108 (90 BASE) MCG/ACT IN AERS: 2.0000 | INHALATION_SPRAY | Freq: Four times a day (QID) | RESPIRATORY_TRACT | Status: DC | PRN

## 2011-08-29 NOTE — Progress Notes (Signed)
Received orders to discharge patient to home. Reviewed all discharge instructions with patient. Patient was able to identify medication changes, diet changes, follow-up instructions, when to call 911, activity instructions and verbalized understanding of all discharge instructions. Copy of discharge instructions given to patient, IV removed, patient has all belongings (patient had a question regarding her cell phone, the patient's sister present at the bedside said the patient's brother has her cell phone in his car). Patient discharged to home.

## 2011-08-29 NOTE — Discharge Summary (Addendum)
Physician Discharge Summary  Patient ID: Tina Saunders MRN: [id number] DOB/AGE: 30-Nov-1985 26 y.o.  Admit date: 08/25/2011 Discharge date: 08/29/2011  PCP: Sheila Oats, MD  DISCHARGE DIAGNOSES:  Principal Problem:  *CAP (community acquired pneumonia) Active Problems:  POLYCYSTIC OVARIAN DISEASE  Obesity, unspecified  Chest pain  SOB (shortness of breath)  Fever  Hypokalemia  PSVT (paroxysmal supraventricular tachycardia)   RECOMMENDATIONS TO PCP:  DISCHARGE CONDITION: fair  INITIAL HISTORY: Tina Saunders is an 26 y.o. Female who presents with complaints of Chest Pain, and SOB and pain all over since morning prior to admission. She was evaluated in the ED and underwent a CTA of the Chest due to her symptoms and a previous history of a DVT at the age of 26. She denied any fevers or chills. She did report having nausea but no vomiting. 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 This was apparently diagnosed in [place]. Patient was also febrile to 103F at admission.  HOSPITAL COURSE:   Fever/CAP  Possibly from either a viral syndrome or community acquired pneumonia. CT was suggestive of an infectious process. Patient was started on Levaquin, initially IV and then orally. Blood and Urine cultures negative so far. Lactic acid was normal. LFT's were normal. Patient fever subsided. She started feeling better.  Sore Throat  Possible tonsillitis. No erythema noted in back of throat. She improved with throat lozenges and antibiotics.  Single episode of Vomiting/GERD She had one episode of emesis on Sunday night. She takes reglan for this purpose. She gave me a history of GERD but is not on PPI. I initiated PPI and her symptoms have improved. I have asked her to follow up with her primary providers for same.  Borderline Hypotension  Resolved. Could have been from  dehydration or sepsis. Improved with IV fluids.   Chest Pain  Could be from the pulmonary process seen on CT. Cardiac enzymes are negative. ECHO showed normal EF without wall motion abnormalities. Ct was neg for PE. Can stop Aspirin as there is no clear indication for same. Patient is feeling better.  Paroxysmal SVT  This was detected at time of admission. Probably from fever/acute illness. No further episodes were noted. HR returned to normal. TSH is normal. ECHO report reviewed. There was no clear indication for beta blockers.   Shortness of Breath  This was likely for the CAP. She has improved. She has been ambulating in the hallway with no difficulties. Albuterol will be prescribed, to be used as needed.  Morbid Obesity  Will need weight loss counseling at some point.   Hypokalemia  Will be Repleted   Patient denied any complaint this morning. Itching has subsided from last night when she ate peanut butter. Has walked in the hallway. Is keen on going home. Requesting script for neurontin.  PERTINENT LABS: Strep and legionella ag were neg. TSh was 0.50.   Venous dopplers of LE were negative for DVT.  2D ECHOCARDIOGRAM Study Conclusions  - Left ventricle: The cavity size was normal. Wall thickness was normal. Systolic function was normal. The estimated ejection fraction was in the range of 55% to 60%. Wall motion was normal; there were no regional wall motion abnormalities. Left ventricular diastolic function parameters were normal. - Left atrium: The atrium was mildly dilated. - Pericardium, extracardiac: A trivial pericardial effusion was identified.   IMAGING STUDIES Ct Angio Chest Pe  W/cm &/or Wo Cm  08/25/2011  *RADIOLOGY REPORT*  Clinical Data: 26 year old female with shortness of breath. History of sarcoma.  CT ANGIOGRAPHY CHEST  Technique:  Multidetector CT imaging of the chest using the standard protocol during bolus administration of intravenous contrast. Multiplanar  reconstructed images including MIPs were obtained and reviewed to evaluate the vascular anatomy.  Contrast:95 ml intravenous Omnipaque-300  Comparison: 07/01/2011 CT  Findings: This is a technically limited study secondary to mild respiratory motion artifact and suboptimal contrast opacification.  No large or central pulmonary emboli are identified. There is no evidence of thoracic aortic aneurysm. Upper limits normal heart size noted.  Tiny bilateral pleural effusions and bibasilar atelectasis noted. There is no evidence of pericardial effusion or enlarged lymph nodes.  Very mild central ground-glass opacities are noted and may represent mild edema. There is no evidence of airspace disease, nodule, mass, or endobronchial/endotracheal lesion. The visualized upper abdomen is unremarkable. No acute or suspicious bony abnormalities are present.  IMPRESSION: Suboptimal evaluation for pulmonary emboli, but no large or central pulmonary emboli identified.  Mild central ground-glass opacities which could represent mild interstitial edema.  Tiny bilateral pleural effusions and mild bibasilar atelectasis.  Original Report Authenticated By: Rosendo Gros, M.D.   Dg Chest Port 1 View  08/25/2011  *RADIOLOGY REPORT*  Clinical Data: Shortness of breath and chest pressure  PORTABLE CHEST - 1 VIEW  Comparison: 07/01/2011  Findings: The heart size and mediastinal contours are within normal limits.  Both lungs are clear.  The visualized skeletal structures are unremarkable.  IMPRESSION: No active disease.  Original Report Authenticated By: Rosealee Albee, M.D.    DISCHARGE EXAMINATION: Blood pressure 118/87, pulse 87, temperature 98.4 F (36.9 C), temperature source Oral, resp. rate 18, height 5\' 3"  (1.6 m), weight 113 kg (249 lb 1.9 oz), last menstrual period 08/21/2011, SpO2 94.00%. General appearance: alert, cooperative, appears stated age and no distress Resp: clear to auscultation bilaterally Cardio: regular rate  and rhythm, S1, S2 normal, no murmur, click, rub or gallop GI: soft, non-tender; bowel sounds normal; no masses,  no organomegaly Neurologic: Grossly normal  DISPOSITION: Home  Discharge Orders    Future Orders Please Complete By Expires   Diet - low sodium heart healthy      Increase activity slowly      Discharge instructions      Comments:   Be sure to establish with a PCP   Call MD for:      Comments:   Seek attention for high fevers, severe headaches, shortness of breath     Current Discharge Medication List    START taking these medications   Details  albuterol (PROVENTIL HFA;VENTOLIN HFA) 108 (90 BASE) MCG/ACT inhaler Inhale 2 puffs into the lungs every 6 (six) hours as needed for wheezing. Qty: 1 Inhaler, Refills: 2    levofloxacin (LEVAQUIN) 750 MG tablet Take 1 tablet (750 mg total) by mouth daily. Qty: 4 tablet, Refills: 0    omeprazole (PRILOSEC) 20 MG capsule Take 1 capsule (20 mg total) by mouth daily. Qty: 30 capsule, Refills: 1      CONTINUE these medications which have CHANGED   Details  gabapentin (NEURONTIN) 300 MG capsule Take 1 capsule (300 mg total) by mouth 3 (three) times daily. Qty: 90 capsule, Refills: 0    metoCLOPramide (REGLAN) 5 MG tablet Take 1 tablet (5 mg total) by mouth 4 (four) times daily -  before meals and at bedtime. Qty: 120 tablet, Refills: 0  CONTINUE these medications which have NOT CHANGED   Details  promethazine (PHENERGAN) 25 MG suppository Place 25 mg rectally every 6 (six) hours as needed.    QUEtiapine (SEROQUEL) 300 MG tablet Take 300 mg by mouth at bedtime.    sertraline (ZOLOFT) 50 MG tablet Take 50 mg by mouth daily.       Follow-up Information    Follow up with [facility]. (Go as scheduled)          TOTAL DISCHARGE TIME: 35 mins  [facility]  [facility] Pager [phone]  08/29/2011, 12:53 PM

## 2011-09-01 LAB — CULTURE, BLOOD (ROUTINE X 2): Culture: NO GROWTH

## 2011-11-07 ENCOUNTER — Encounter (HOSPITAL_COMMUNITY): Payer: Self-pay | Admitting: Emergency Medicine

## 2011-11-07 ENCOUNTER — Emergency Department (HOSPITAL_COMMUNITY): Payer: Self-pay

## 2011-11-07 ENCOUNTER — Emergency Department (HOSPITAL_COMMUNITY)
Admission: EM | Admit: 2011-11-07 | Discharge: 2011-11-07 | Disposition: A | Discharge status: Home / Self Care | Payer: Self-pay | Attending: Emergency Medicine | Admitting: Emergency Medicine

## 2011-11-07 DIAGNOSIS — R0789 Other chest pain: Secondary | ICD-10-CM

## 2011-11-07 DIAGNOSIS — K219 Gastro-esophageal reflux disease without esophagitis: Secondary | ICD-10-CM | POA: Insufficient documentation

## 2011-11-07 DIAGNOSIS — Z9089 Acquired absence of other organs: Secondary | ICD-10-CM | POA: Insufficient documentation

## 2011-11-07 LAB — URINALYSIS, ROUTINE W REFLEX MICROSCOPIC
Glucose, UA: NEGATIVE mg/dL
Nitrite: NEGATIVE
Protein, ur: NEGATIVE mg/dL
pH: 5.5 (ref 5.0–8.0)

## 2011-11-07 LAB — CBC WITH DIFFERENTIAL/PLATELET
Basophils Relative: 1 % (ref 0–1)
Eosinophils Absolute: 0.2 10*3/uL (ref 0.0–0.7)
Eosinophils Relative: 3 % (ref 0–5)
HCT: 40.3 % (ref 36.0–46.0)
Hemoglobin: 13.9 g/dL (ref 12.0–15.0)
MCH: 28.6 pg (ref 26.0–34.0)
MCHC: 34.5 g/dL (ref 30.0–36.0)
MCV: 82.9 fL (ref 78.0–100.0)
Monocytes Absolute: 0.3 10*3/uL (ref 0.1–1.0)
Monocytes Relative: 6 % (ref 3–12)

## 2011-11-07 LAB — POCT I-STAT TROPONIN I: Troponin i, poc: 0 ng/mL (ref 0.00–0.08)

## 2011-11-07 LAB — COMPREHENSIVE METABOLIC PANEL
Albumin: 3.4 g/dL — ABNORMAL LOW (ref 3.5–5.2)
BUN: 6 mg/dL (ref 6–23)
Creatinine, Ser: 0.5 mg/dL (ref 0.50–1.10)
GFR calc Af Amer: >90 mL/min (ref >90)
Total Protein: 7.3 g/dL (ref 6.0–8.3)

## 2011-11-07 LAB — PREGNANCY, URINE: Preg Test, Ur: NEGATIVE

## 2011-11-07 LAB — URINE MICROSCOPIC-ADD ON

## 2011-11-07 MED ORDER — FAMOTIDINE 20 MG PO TABS: 20.0000 mg | ORAL_TABLET | Freq: Two times a day (BID) | ORAL | Status: DC

## 2011-11-07 MED ORDER — FAMOTIDINE 20 MG PO TABS
20.0000 mg | ORAL_TABLET | Freq: Once | ORAL | Status: AC
  Administered 2011-11-07: 20 mg via ORAL
  Filled 2011-11-07: qty 1

## 2011-11-07 MED ORDER — NAPROXEN 500 MG PO TABS: 500.0000 mg | ORAL_TABLET | Freq: Two times a day (BID) | ORAL | Status: DC

## 2011-11-07 MED ORDER — GI COCKTAIL ~~LOC~~
30.0000 mL | Freq: Once | ORAL | Status: AC
  Administered 2011-11-07: 30 mL via ORAL
  Filled 2011-11-07: qty 30

## 2011-11-07 NOTE — ED Notes (Signed)
Patient transported to X-ray 

## 2011-11-07 NOTE — ED Notes (Signed)
Onset upon awakening upper CP, non radiating, mid abd pain, nausea no emesis, non prod cough. Intermittent SOB. Pain worsens with deep breaths & palpation. Pain has been constant since onset. Denies fever, diarrhea, UTI Sx's. Reports same Sx's when she was admitted approx 2 months ago, "feels the same".

## 2011-11-07 NOTE — ED Provider Notes (Signed)
History     CSN: [id number]  Arrival date & time 11/07/11  0720   First MD Initiated Contact with Patient 11/07/11 [phone]      No chief complaint on file.   (Consider location/radiation/quality/duration/timing/severity/associated sxs/prior treatment) HPI Comments: Tina Saunders is a 26 y.o. Female who presents with chest pain, abdominal pain, since yesterday. States pain is constant. Admits to dry cough. Shortness of breath that comes and goes and is not exertional. States chest is tender to palpation and painful with deep inspirations. States was recently in the hospital for CAP. States abdomen is painful in the upper abdomen. Reports nausea, no vomiting. States pain same as when she was admitted, states was supposed to take Prilosec, however she is not taking it. Denies fever, chills, vomiting, diarrhea, urinary symptoms. Did not take any medications prior to coming in.    Past Medical History  Diagnosis Date  . Cancer of abdominal wall   . Polycystic ovarian syndrome 07/01/2011    Patient report  . Rhabdosarcoma     Past Surgical History  Procedure Date  . Cholecystectomy   . Varicose vein surgery   . Ovarian cyst excision     Family History  Problem Relation Age of Onset  . Coronary artery disease Maternal Grandmother   . Diabetes type II Maternal Grandmother   . Cancer Maternal Grandmother   . Hypertension Mother   . Hypertension Father     History  Substance Use Topics  . Smoking status: Never Smoker   . Smokeless tobacco: Not on file  . Alcohol Use: No    OB History    Grav Para Term Preterm Abortions TAB SAB Ect Mult Living                  Review of Systems  Constitutional: Negative for fever and chills.  HENT: Negative for neck pain and neck stiffness.   Respiratory: Positive for cough, chest tightness and shortness of breath. Negative for wheezing and stridor.   Cardiovascular: Positive for chest pain. Negative for palpitations and leg swelling.    Gastrointestinal: Positive for nausea and abdominal pain. Negative for vomiting, diarrhea, constipation, blood in stool and rectal pain.  Genitourinary: Negative for dysuria.  Skin: Negative.   Neurological: Negative for dizziness, weakness and headaches.  Hematological: Does not bruise/bleed easily.    Allergies  Morphine and related  Home Medications   Current Outpatient Rx  Name Route Sig Dispense Refill  . QUETIAPINE FUMARATE ER 400 MG PO TB24 Oral Take 400 mg by mouth at bedtime.    Marland Kitchen PROMETHAZINE HCL 25 MG RE SUPP Rectal Place 25 mg rectally every 6 (six) hours as needed.      BP 127/55  Pulse 98  Temp 97.7 F (36.5 C) (Oral)  Resp 18  SpO2 99%  Physical Exam  Nursing note and vitals reviewed. Constitutional: She is oriented to person, place, and time. She appears well-developed and well-nourished. No distress.  HENT:  Head: Normocephalic and atraumatic.  Left Ear: External ear normal.  Nose: Nose normal.  Mouth/Throat: Oropharynx is clear and moist. No oropharyngeal exudate.  Eyes: Conjunctivae normal are normal.  Neck: Normal range of motion. Neck supple.  Cardiovascular: Normal rate, regular rhythm and normal heart sounds.   Pulmonary/Chest: Effort normal and breath sounds normal. No respiratory distress. She has no wheezes. She has no rales. She exhibits tenderness.       Diffuse chest tenderness, worse over the sternum. No rash, bruising  Abdominal:  Soft. Bowel sounds are normal. She exhibits no distension. There is no rebound and no guarding.       Diffuse abdominal tenderness  Musculoskeletal: She exhibits no edema.  Lymphadenopathy:    She has no cervical adenopathy.  Neurological: She is alert and oriented to person, place, and time.  Skin: Skin is warm and dry. No rash noted.  Psychiatric: She has a normal mood and affect.    ED Course  Procedures (including critical care time)  Pt with non specific chest pain, abdominal pain. Recent admission for  the same in august, 2 mon ago. States symptoms feel similar to how she felt at that time. Pt was diagnosed with CAP and gerd at that time. She had negative workup for ACS with negative troponins and normal echocardiogram, as well as CT angio. Pt is non compliant with her PPIs. VS normal today. will get labs, CXR. Pt does not have PCP.    Date: 11/07/2011  Rate: 91  Rhythm: normal sinus rhythm  QRS Axis: normal  Intervals: normal  ST/T Wave abnormalities: nonspecific T wave changes  Conduction Disutrbances:none  Narrative Interpretation: low voltage  Old EKG Reviewed: changes noted Sinus tach resolved  Filed Vitals:   11/07/11 0807  BP: 136/93  Pulse: 87  Temp:   Resp: 15   Results for orders placed during the hospital encounter of 11/07/11  CBC WITH DIFFERENTIAL      Component Value Range   WBC 6.2  4.0 - 10.5 K/uL   RBC 4.86  3.87 - 5.11 MIL/uL   Hemoglobin 13.9  12.0 - 15.0 g/dL   HCT 40.9  81.1 - 91.4 %   MCV 82.9  78.0 - 100.0 fL   MCH 28.6  26.0 - 34.0 pg   MCHC 34.5  30.0 - 36.0 g/dL   RDW 78.2  95.6 - 21.3 %   Platelets 225  150 - 400 K/uL   Neutrophils Relative 64  43 - 77 %   Neutro Abs 4.0  1.7 - 7.7 K/uL   Lymphocytes Relative 27  12 - 46 %   Lymphs Abs 1.7  0.7 - 4.0 K/uL   Monocytes Relative 6  3 - 12 %   Monocytes Absolute 0.3  0.1 - 1.0 K/uL   Eosinophils Relative 3  0 - 5 %   Eosinophils Absolute 0.2  0.0 - 0.7 K/uL   Basophils Relative 1  0 - 1 %   Basophils Absolute 0.0  0.0 - 0.1 K/uL  COMPREHENSIVE METABOLIC PANEL      Component Value Range   Sodium 136  135 - 145 mEq/L   Potassium 4.0  3.5 - 5.1 mEq/L   Chloride 102  96 - 112 mEq/L   CO2 25  19 - 32 mEq/L   Glucose, Bld 96  70 - 99 mg/dL   BUN 6  6 - 23 mg/dL   Creatinine, Ser 0.86  0.50 - 1.10 mg/dL   Calcium 8.9  8.4 - 57.8 mg/dL   Total Protein 7.3  6.0 - 8.3 g/dL   Albumin 3.4 (*) 3.5 - 5.2 g/dL   AST 55 (*) 0 - 37 U/L   ALT 43 (*) 0 - 35 U/L   Alkaline Phosphatase 80  39 - 117 U/L    Total Bilirubin 0.2 (*) 0.3 - 1.2 mg/dL   GFR calc non Af Amer >90  >90 mL/min   GFR calc Af Amer >90  >90 mL/min  URINALYSIS, ROUTINE W REFLEX MICROSCOPIC  Component Value Range   Color, Urine YELLOW  YELLOW   APPearance CLOUDY (*) CLEAR   Specific Gravity, Urine 1.012  1.005 - 1.030   pH 5.5  5.0 - 8.0   Glucose, UA NEGATIVE  NEGATIVE mg/dL   Hgb urine dipstick LARGE (*) NEGATIVE   Bilirubin Urine NEGATIVE  NEGATIVE   Ketones, ur NEGATIVE  NEGATIVE mg/dL   Protein, ur NEGATIVE  NEGATIVE mg/dL   Urobilinogen, UA 0.2  0.0 - 1.0 mg/dL   Nitrite NEGATIVE  NEGATIVE   Leukocytes, UA MODERATE (*) NEGATIVE  PREGNANCY, URINE      Component Value Range   Preg Test, Ur NEGATIVE  NEGATIVE  LIPASE, BLOOD      Component Value Range   Lipase 28  11 - 59 U/L  POCT I-STAT TROPONIN I      Component Value Range   Troponin i, poc 0.00  0.00 - 0.08 ng/mL   Comment 3           URINE MICROSCOPIC-ADD ON      Component Value Range   Squamous Epithelial / LPF FEW (*) RARE   WBC, UA 3-6  <3 WBC/hpf   RBC / HPF 0-2  <3 RBC/hpf   Bacteria, UA FEW (*) RARE   Dg Chest 2 View  11/07/2011  *RADIOLOGY REPORT*  Clinical Data: Mid chest pain. Shortness of breath.  Upper abdominal pain.  History of rhabdomyosarcoma.  CHEST - 2 VIEW  Comparison: 08/25/2011 chest x-ray and CT.  Findings: Minimal nodularity just below the anterior aspect of the left second rib probably represents crossing vessel.  Tiny pulmonary nodule difficult to completely exclude given the patient's history of sarcoma.  No nodule is seen in this region on the most recent CT.  Attention to this on close follow-up.  Cardiomegaly.  Central pulmonary vascular prominence.  No frank pulmonary edema.  No segmental infiltrate or pneumothorax.  IMPRESSION: Cardiomegaly.  Minimal nodularity left upper lung zone as noted above.   Original Report Authenticated By: Fuller Canada, M.D.     10:00 AM Pt's abdominal pain improved with GI cocktail and  pepcid. She is still having chest pain, which is reproducible with deep inspirations and palpation. Pt had ct angio 2 mon ago for similar symptoms and today she is PERC negative. Doubt PE. Her work up here, including CXR and tropon, ecg, all unremarkable. LFTs mildly elevated, but unchanged from 2 mon ago. Will need follow up.  She is in no distress. VS normal. Suspect GERD with possible pleurisy vs costochondritis. Pt is stable for d/c home at this time. Will start on pepcid and naprosyn for pain.    1. Chest wall pain   2. GERD (gastroesophageal reflux disease)       MDM  See above        Lottie Mussel, PA 11/07/11 1516

## 2011-11-07 NOTE — ED Notes (Signed)
Chest pain and abd pain since yesterday  Felt nauseated no vomiting  Some sob lmp now denies dysuria

## 2011-11-08 LAB — URINE CULTURE: Colony Count: 2000

## 2011-11-08 NOTE — ED Provider Notes (Signed)
Medical screening examination/treatment/procedure(s) were performed by non-physician practitioner and as supervising physician I was immediately available for consultation/collaboration.   Dione Booze, MD 11/08/11 [phone]

## 2011-12-03 ENCOUNTER — Emergency Department (HOSPITAL_COMMUNITY)
Admission: EM | Admit: 2011-12-03 | Discharge: 2011-12-07 | Disposition: A | Discharge status: Other Acute Inpatient Hospital | Payer: Self-pay | Attending: Emergency Medicine | Admitting: Emergency Medicine

## 2011-12-03 ENCOUNTER — Encounter (HOSPITAL_COMMUNITY): Payer: Self-pay | Admitting: Emergency Medicine

## 2011-12-03 DIAGNOSIS — Z8509 Personal history of malignant neoplasm of other digestive organs: Secondary | ICD-10-CM | POA: Insufficient documentation

## 2011-12-03 DIAGNOSIS — F3289 Other specified depressive episodes: Secondary | ICD-10-CM | POA: Insufficient documentation

## 2011-12-03 DIAGNOSIS — H5316 Psychophysical visual disturbances: Secondary | ICD-10-CM | POA: Insufficient documentation

## 2011-12-03 DIAGNOSIS — E282 Polycystic ovarian syndrome: Secondary | ICD-10-CM | POA: Insufficient documentation

## 2011-12-03 DIAGNOSIS — F329 Major depressive disorder, single episode, unspecified: Secondary | ICD-10-CM

## 2011-12-03 DIAGNOSIS — F411 Generalized anxiety disorder: Secondary | ICD-10-CM | POA: Insufficient documentation

## 2011-12-03 DIAGNOSIS — R45851 Suicidal ideations: Secondary | ICD-10-CM | POA: Insufficient documentation

## 2011-12-03 DIAGNOSIS — R4589 Other symptoms and signs involving emotional state: Secondary | ICD-10-CM

## 2011-12-03 DIAGNOSIS — Z79899 Other long term (current) drug therapy: Secondary | ICD-10-CM | POA: Insufficient documentation

## 2011-12-03 DIAGNOSIS — R443 Hallucinations, unspecified: Secondary | ICD-10-CM

## 2011-12-03 DIAGNOSIS — F32A Depression, unspecified: Secondary | ICD-10-CM

## 2011-12-03 HISTORY — DX: Major depressive disorder, single episode, unspecified: F32.9

## 2011-12-03 HISTORY — DX: Depression, unspecified: F32.A

## 2011-12-03 HISTORY — DX: Anxiety disorder, unspecified: F41.9

## 2011-12-03 LAB — URINE MICROSCOPIC-ADD ON

## 2011-12-03 LAB — CBC WITH DIFFERENTIAL/PLATELET
Basophils Absolute: 0 10*3/uL (ref 0.0–0.1)
Basophils Relative: 0 % (ref 0–1)
Eosinophils Absolute: 0.3 10*3/uL (ref 0.0–0.7)
Eosinophils Relative: 3 % (ref 0–5)
HCT: 40.3 % (ref 36.0–46.0)
Lymphocytes Relative: 26 % (ref 12–46)
MCH: 28 pg (ref 26.0–34.0)
MCHC: 33.7 g/dL (ref 30.0–36.0)
MCV: 82.9 fL (ref 78.0–100.0)
Monocytes Absolute: 0.4 10*3/uL (ref 0.1–1.0)
Platelets: 221 10*3/uL (ref 150–400)
RDW: 13.3 % (ref 11.5–15.5)
WBC: 7.7 10*3/uL (ref 4.0–10.5)

## 2011-12-03 LAB — URINALYSIS, ROUTINE W REFLEX MICROSCOPIC
Ketones, ur: NEGATIVE mg/dL
Protein, ur: NEGATIVE mg/dL
Urobilinogen, UA: 0.2 mg/dL (ref 0.0–1.0)

## 2011-12-03 LAB — BASIC METABOLIC PANEL
Calcium: 8.6 mg/dL (ref 8.4–10.5)
Creatinine, Ser: 0.62 mg/dL (ref 0.50–1.10)
GFR calc Af Amer: >90 mL/min (ref >90)
GFR calc non Af Amer: >90 mL/min (ref >90)
Sodium: 136 mEq/L (ref 135–145)

## 2011-12-03 LAB — SALICYLATE LEVEL: Salicylate Lvl: <2 mg/dL — ABNORMAL LOW (ref 2.8–20.0)

## 2011-12-03 LAB — POCT PREGNANCY, URINE: Preg Test, Ur: NEGATIVE

## 2011-12-03 LAB — RAPID URINE DRUG SCREEN, HOSP PERFORMED
Barbiturates: NOT DETECTED
Cocaine: NOT DETECTED
Tetrahydrocannabinol: NOT DETECTED

## 2011-12-03 MED ORDER — FAMOTIDINE 20 MG PO TABS
20.0000 mg | ORAL_TABLET | Freq: Two times a day (BID) | ORAL | Status: DC
  Administered 2011-12-03 – 2011-12-06 (×7): 20 mg via ORAL
  Filled 2011-12-03 (×7): qty 1

## 2011-12-03 MED ORDER — NAPROXEN 250 MG PO TABS
500.0000 mg | ORAL_TABLET | Freq: Two times a day (BID) | ORAL | Status: DC
  Administered 2011-12-04 – 2011-12-06 (×6): 500 mg via ORAL
  Filled 2011-12-03: qty 1
  Filled 2011-12-03: qty 2
  Filled 2011-12-03: qty 1
  Filled 2011-12-03 (×3): qty 2

## 2011-12-03 MED ORDER — ACETAMINOPHEN 325 MG PO TABS: 650.0000 mg | ORAL_TABLET | ORAL | Status: DC | PRN

## 2011-12-03 MED ORDER — GABAPENTIN 300 MG PO CAPS
300.0000 mg | ORAL_CAPSULE | Freq: Three times a day (TID) | ORAL | Status: DC
  Administered 2011-12-03 – 2011-12-06 (×10): 300 mg via ORAL
  Filled 2011-12-03 (×10): qty 1

## 2011-12-03 MED ORDER — IBUPROFEN 200 MG PO TABS: 600.0000 mg | ORAL_TABLET | Freq: Three times a day (TID) | ORAL | Status: DC | PRN

## 2011-12-03 MED ORDER — ZOLPIDEM TARTRATE 5 MG PO TABS
5.0000 mg | ORAL_TABLET | Freq: Every evening | ORAL | Status: DC | PRN
  Administered 2011-12-04 – 2011-12-06 (×3): 5 mg via ORAL
  Filled 2011-12-03 (×3): qty 1

## 2011-12-03 MED ORDER — QUETIAPINE FUMARATE ER 300 MG PO TB24
600.0000 mg | ORAL_TABLET | Freq: Every day | ORAL | Status: DC
  Administered 2011-12-03 – 2011-12-06 (×4): 600 mg via ORAL
  Filled 2011-12-03 (×6): qty 2

## 2011-12-03 MED ORDER — ALUM & MAG HYDROXIDE-SIMETH 200-200-20 MG/5ML PO SUSP: 30.0000 mL | ORAL | Status: DC | PRN

## 2011-12-03 MED ORDER — SERTRALINE HCL 50 MG PO TABS
100.0000 mg | ORAL_TABLET | Freq: Every evening | ORAL | Status: DC
  Administered 2011-12-03 – 2011-12-06 (×4): 100 mg via ORAL
  Filled 2011-12-03 (×4): qty 2

## 2011-12-03 MED ORDER — ONDANSETRON HCL 8 MG PO TABS: 4.0000 mg | ORAL_TABLET | Freq: Three times a day (TID) | ORAL | Status: DC | PRN

## 2011-12-03 NOTE — ED Provider Notes (Addendum)
History  This chart was scribed for Gwyneth Sprout, MD by Erskine Emery, ED Scribe. This patient was seen in room TR08C/TR08C and the patient's care was started at 18:32.   CSN: [id number]  Arrival date & time 12/03/11  1700   First MD Initiated Contact with Patient 12/03/11 1832      Chief Complaint  Patient presents with  . Depression  . Suicidal    (Consider location/radiation/quality/duration/timing/severity/associated sxs/prior treatment) HPI Tina Saunders is a 26 y.o. female who presents to the Emergency Department complaining of depression and anxiety for years, but worse since yesterday. Pt also reports she has been seeing and hearing things that other people say are not there. She describes these hallucinations as scary people that tell her to "go ahead and end it, it will be all right." Pt is taking medication as prescribed by her psychiatrist, but she doesn't think it is working. Pt reports she last changed her medications about a month ago. October 31st (11 days ago) was the last time she saw her psychiatrist. Pt reports she does have access to weapons at home. Pt knows of no recent stressors to cause the current episode. Pt does not drink, smoke, or take any drugs. Pt's LNMP was the end of October. Pt also presents bilateral leg pain with no radiation for a number of weeks.  Past Medical History  Diagnosis Date  . Cancer of abdominal wall   . Polycystic ovarian syndrome 07/01/2011    Patient report  . Rhabdosarcoma   . Anxiety   . Depression     Past Surgical History  Procedure Date  . Cholecystectomy   . Varicose vein surgery   . Ovarian cyst excision     Family History  Problem Relation Age of Onset  . Coronary artery disease Maternal Grandmother   . Diabetes type II Maternal Grandmother   . Cancer Maternal Grandmother   . Hypertension Mother   . Hypertension Father     History  Substance Use Topics  . Smoking status: Never Smoker   . Smokeless  tobacco: Not on file  . Alcohol Use: No    OB History    Grav Para Term Preterm Abortions TAB SAB Ect Mult Living                  Review of Systems  Constitutional: Negative for fever and chills.  HENT: Negative for congestion.   Respiratory: Negative for shortness of breath.   Gastrointestinal: Negative for nausea and vomiting.  Genitourinary: Negative for hematuria.  Musculoskeletal: Negative for joint swelling.       Bilateral leg pain  Skin: Negative for wound.  Neurological: Negative for weakness.  Psychiatric/Behavioral: Positive for suicidal ideas, hallucinations and agitation. Negative for self-injury.    Allergies  Morphine and related  Home Medications   Current Outpatient Rx  Name  Route  Sig  Dispense  Refill  . FAMOTIDINE 20 MG PO TABS   Oral   Take 1 tablet (20 mg total) by mouth 2 (two) times daily.   30 tablet   0   . NAPROXEN 500 MG PO TABS   Oral   Take 1 tablet (500 mg total) by mouth 2 (two) times daily.   30 tablet   0   . QUETIAPINE FUMARATE ER 300 MG PO TB24   Oral   Take 600 mg by mouth at bedtime.         . SERTRALINE HCL 100 MG PO TABS  Oral   Take 100 mg by mouth every evening.         Marland Kitchen PROMETHAZINE HCL 25 MG RE SUPP   Rectal   Place 25 mg rectally every 6 (six) hours as needed.           BP 142/81  Pulse 99  Temp 97.5 F (36.4 C) (Oral)  Resp 16  SpO2 99%  LMP 11/07/2011  Physical Exam  Nursing note and vitals reviewed. Constitutional: She is oriented to person, place, and time. She appears well-developed and well-nourished. No distress.  HENT:  Head: Normocephalic and atraumatic.  Eyes: EOM are normal. Pupils are equal, round, and reactive to light.  Neck: Neck supple. No tracheal deviation present.  Cardiovascular: Normal rate.   Pulmonary/Chest: Effort normal. No respiratory distress.  Abdominal: Soft. She exhibits no distension.  Musculoskeletal: Normal range of motion. She exhibits no edema.    Neurological: She is alert and oriented to person, place, and time.  Skin: Skin is warm and dry.  Psychiatric:       Auditory and visual hallucinations that are commanding her to hurt herself. Suicidal. Depressed. Flat affect. Poor judgement.    ED Course  Procedures (including critical care time) DIAGNOSTIC STUDIES: Oxygen Saturation is 99% on room air, normal by my interpretation.    COORDINATION OF CARE: 18:39--I evaluated the patient and we discussed a treatment plan including blood work, urinalysis, and medication to which the pt agreed.    Labs Reviewed  BASIC METABOLIC PANEL - Abnormal; Notable for the following:    BUN 5 (*)     All other components within normal limits  URINALYSIS, ROUTINE W REFLEX MICROSCOPIC - Abnormal; Notable for the following:    APPearance CLOUDY (*)     Leukocytes, UA MODERATE (*)     All other components within normal limits  SALICYLATE LEVEL - Abnormal; Notable for the following:    Salicylate Lvl <2.0 (*)     All other components within normal limits  CBC WITH DIFFERENTIAL  ACETAMINOPHEN LEVEL  URINE RAPID DRUG SCREEN (HOSP PERFORMED)  POCT PREGNANCY, URINE  URINE MICROSCOPIC-ADD ON   No results found.   1. Suicidal behavior   2. Depression   3. Hallucinations       MDM   Patient is here complaining of suicidal ideation as well as auditory and visual hallucinations. She states this has been ongoing for a prolonged period of time but started bothering her yesterday. The auditory hallucinations are command telling her it will be fine if she kills herself. She states she is still taking her medications but they don't seem to be working. She denies any alcohol or drug use. Her labs are within normal limits and she is medically cleared to go to pod C for further evaluation.      I personally performed the services described in this documentation, which was scribed in my presence.  The recorded information has been reviewed and  considered.    Gwyneth Sprout, MD 12/03/11 1857  Gwyneth Sprout, MD 12/03/11 1918  Gwyneth Sprout, MD 12/03/11 1610

## 2011-12-03 NOTE — ED Notes (Addendum)
Pt reports depression and suicidal thoughts onset yesterday. Pt also reports a lot of anxiety. Pt reports hearing voices and seeing things that other people do not hear or see. Pt reports voices just keep talking but are not saying anything particular. Pt reports seeing scary people.

## 2011-12-03 NOTE — ED Notes (Signed)
Dr. Bernette Mayers notiifed of chest pain, EKG completed. Pt is resting comfortably in room at this time.

## 2011-12-03 NOTE — ED Provider Notes (Signed)
Pt complaining of CP, sharp, reproducible L upper chest.    Date: 12/03/2011  Rate: 103  Rhythm: sinus tachycardia  QRS Axis: normal  Intervals: normal  ST/T Wave abnormalities: normal  Conduction Disutrbances:none  Narrative Interpretation:   Old EKG Reviewed: unchanged    Charles B. Bernette Mayers, MD 12/03/11 2218

## 2011-12-03 NOTE — ED Notes (Signed)
Pt placed in paper scrubs, belongings placed in bag, security called to wand pt

## 2011-12-03 NOTE — ED Notes (Signed)
Diet tray ordered 

## 2011-12-03 NOTE — ED Notes (Signed)
Pt reports chest pain/chest palpitations.

## 2011-12-03 NOTE — ED Notes (Signed)
Pt requesting Gabapentin states she takes 500 mg. Called pharmacy to verify, pt pharmacy is closed at this time.

## 2011-12-04 NOTE — ED Notes (Signed)
Pt ate breakfast up to br to shower sittter in room pt aaox4 co operative no c/o
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
## 2011-12-04 NOTE — Progress Notes (Signed)
Tina Saunders  25 y.o.  [id number] Mar 29, 1985  12/04/2011   Diagnosis:  Psychosis NOS  Subjective: This patient was presented and accepted to [facility] by Doylene Canning on Monday at 11:47 AM Orders not written as there are patients in front of her.  Assessment: Accepted pending bed availability on [address].  Rona Ravens. Carnie Bruemmer [facility] 12/04/2011 11:45 AM

## 2011-12-04 NOTE — ED Notes (Signed)
First meeting with patient. Patient resting while eating breakfast with NAD. Sitter at bedside.

## 2011-12-05 MED ORDER — LORAZEPAM 1 MG PO TABS
1.0000 mg | ORAL_TABLET | Freq: Once | ORAL | Status: AC
  Administered 2011-12-05: 1 mg via ORAL
  Filled 2011-12-05: qty 1

## 2011-12-05 NOTE — ED Notes (Signed)
Pt. Requested a snack.  Given peanut butter and Graham crackers, and Coke.

## 2011-12-05 NOTE — ED Notes (Signed)
The pt is requesting to speak to an act team person that she sees across the room.  Snack given

## 2011-12-05 NOTE — ED Notes (Signed)
Meal tray provided to patient.  Sitter remains in room.

## 2011-12-05 NOTE — ED Notes (Signed)
Sitter at the bedside.

## 2011-12-05 NOTE — ED Notes (Signed)
Pt eating dinner sitter at the bedside

## 2011-12-05 NOTE — ED Notes (Signed)
Pt sleeping. Sitter at bedside. Area is safe.

## 2011-12-06 MED ORDER — DIAZEPAM 5 MG PO TABS
5.0000 mg | ORAL_TABLET | Freq: Four times a day (QID) | ORAL | Status: DC | PRN
  Administered 2011-12-06 (×2): 5 mg via ORAL
  Filled 2011-12-06 (×2): qty 1

## 2011-12-06 NOTE — ED Notes (Signed)
Messaged pharmacy to have medications sent to [facility]

## 2011-12-06 NOTE — ED Notes (Signed)
Pt states still having SI. Pt denies hallucinations.

## 2011-12-06 NOTE — ED Notes (Signed)
Report received from Ber[place],RN 

## 2011-12-06 NOTE — ED Notes (Signed)
Pt states there is an anxious feeling in her stomach that is not really pain.

## 2011-12-06 NOTE — ED Notes (Signed)
Pt states feeling "almost like a panic attack" pt state heart racing. Pt states feeling anxious. Pt states yesterday given Ativan but, does not feel like it helped. Pt states still having SI.

## 2011-12-06 NOTE — ED Notes (Signed)
Meal tray delivered.

## 2011-12-06 NOTE — ED Provider Notes (Signed)
Pt sleeping this am.  VSS.  Awaiting psych disposition.  Evaluated earlier for complaints of chest pain.  Celene Kras, MD 12/06/11 [phone]

## 2011-12-06 NOTE — ED Notes (Signed)
Pt ambulating hall with sitter.
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
## 2011-12-06 NOTE — ED Notes (Signed)
Pt denies pain; pt states still having SI; pt denies hallucinations.

## 2011-12-06 NOTE — ED Notes (Signed)
Pts meal tray warmed from earlier; pt eating at bedside.

## 2011-12-06 NOTE — ED Notes (Signed)
Pt currently denies pain; pt states still having SI; pt denies HI. Pt denies hallucinations

## 2011-12-07 ENCOUNTER — Inpatient Hospital Stay (HOSPITAL_COMMUNITY)
Admission: EM | Admit: 2011-12-07 | Discharge: 2011-12-11 | DRG: 885 | Disposition: A | Discharge status: Home / Self Care | Payer: Federal, State, Local not specified - Other | Source: Ambulatory Visit | Attending: Psychiatry | Admitting: Psychiatry

## 2011-12-07 ENCOUNTER — Encounter (HOSPITAL_COMMUNITY): Payer: Self-pay | Admitting: Behavioral Health

## 2011-12-07 DIAGNOSIS — F4310 Post-traumatic stress disorder, unspecified: Secondary | ICD-10-CM | POA: Diagnosis present

## 2011-12-07 DIAGNOSIS — F419 Anxiety disorder, unspecified: Secondary | ICD-10-CM

## 2011-12-07 DIAGNOSIS — F3164 Bipolar disorder, current episode mixed, severe, with psychotic features: Principal | ICD-10-CM | POA: Diagnosis present

## 2011-12-07 DIAGNOSIS — R1013 Epigastric pain: Secondary | ICD-10-CM

## 2011-12-07 DIAGNOSIS — E282 Polycystic ovarian syndrome: Secondary | ICD-10-CM | POA: Diagnosis present

## 2011-12-07 DIAGNOSIS — K5900 Constipation, unspecified: Secondary | ICD-10-CM | POA: Diagnosis present

## 2011-12-07 DIAGNOSIS — F315 Bipolar disorder, current episode depressed, severe, with psychotic features: Secondary | ICD-10-CM

## 2011-12-07 DIAGNOSIS — F329 Major depressive disorder, single episode, unspecified: Secondary | ICD-10-CM

## 2011-12-07 DIAGNOSIS — G479 Sleep disorder, unspecified: Secondary | ICD-10-CM

## 2011-12-07 DIAGNOSIS — R45851 Suicidal ideations: Secondary | ICD-10-CM

## 2011-12-07 DIAGNOSIS — Z79899 Other long term (current) drug therapy: Secondary | ICD-10-CM

## 2011-12-07 DIAGNOSIS — C494 Malignant neoplasm of connective and soft tissue of abdomen: Secondary | ICD-10-CM | POA: Diagnosis present

## 2011-12-07 LAB — TSH: TSH: 3.42 u[IU]/mL (ref 0.350–4.500)

## 2011-12-07 MED ORDER — NICOTINE 21 MG/24HR TD PT24
21.0000 mg | MEDICATED_PATCH | Freq: Every day | TRANSDERMAL | Status: DC
  Filled 2011-12-07 (×2): qty 1

## 2011-12-07 MED ORDER — ALUM & MAG HYDROXIDE-SIMETH 200-200-20 MG/5ML PO SUSP
30.0000 mL | ORAL | Status: DC | PRN
  Administered 2011-12-08: 30 mL via ORAL

## 2011-12-07 MED ORDER — ZOLPIDEM TARTRATE 10 MG PO TABS
10.0000 mg | ORAL_TABLET | Freq: Every evening | ORAL | Status: DC | PRN
  Administered 2011-12-07 – 2011-12-10 (×4): 10 mg via ORAL
  Filled 2011-12-07 (×4): qty 1

## 2011-12-07 MED ORDER — ACETAMINOPHEN 325 MG PO TABS: 650.0000 mg | ORAL_TABLET | Freq: Four times a day (QID) | ORAL | Status: DC | PRN

## 2011-12-07 MED ORDER — SERTRALINE HCL 100 MG PO TABS
100.0000 mg | ORAL_TABLET | Freq: Every evening | ORAL | Status: DC
  Administered 2011-12-07 – 2011-12-10 (×4): 100 mg via ORAL
  Filled 2011-12-07 (×4): qty 1
  Filled 2011-12-07: qty 7
  Filled 2011-12-07: qty 1

## 2011-12-07 MED ORDER — PROMETHAZINE HCL 25 MG RE SUPP: 25.0000 mg | Freq: Four times a day (QID) | RECTAL | Status: DC | PRN

## 2011-12-07 MED ORDER — HYDROXYZINE HCL 25 MG PO TABS
25.0000 mg | ORAL_TABLET | Freq: Four times a day (QID) | ORAL | Status: DC | PRN
  Administered 2011-12-07 – 2011-12-08 (×3): 25 mg via ORAL

## 2011-12-07 MED ORDER — FAMOTIDINE 20 MG PO TABS
20.0000 mg | ORAL_TABLET | Freq: Two times a day (BID) | ORAL | Status: DC
  Administered 2011-12-07 – 2011-12-11 (×8): 20 mg via ORAL
  Filled 2011-12-07: qty 1
  Filled 2011-12-07: qty 14
  Filled 2011-12-07 (×2): qty 1
  Filled 2011-12-07: qty 14
  Filled 2011-12-07 (×8): qty 1

## 2011-12-07 MED ORDER — QUETIAPINE FUMARATE ER 300 MG PO TB24
600.0000 mg | ORAL_TABLET | Freq: Every day | ORAL | Status: DC
  Administered 2011-12-07 – 2011-12-10 (×4): 600 mg via ORAL
  Filled 2011-12-07: qty 2
  Filled 2011-12-07: qty 14
  Filled 2011-12-07 (×5): qty 2

## 2011-12-07 MED ORDER — NAPROXEN 500 MG PO TABS
500.0000 mg | ORAL_TABLET | Freq: Two times a day (BID) | ORAL | Status: DC
  Administered 2011-12-07 – 2011-12-11 (×8): 500 mg via ORAL
  Filled 2011-12-07 (×5): qty 1
  Filled 2011-12-07 (×2): qty 14
  Filled 2011-12-07 (×6): qty 1

## 2011-12-07 MED ORDER — TRAZODONE HCL 50 MG PO TABS
50.0000 mg | ORAL_TABLET | Freq: Every evening | ORAL | Status: DC | PRN
  Filled 2011-12-07 (×2): qty 1

## 2011-12-07 MED ORDER — MAGNESIUM HYDROXIDE 400 MG/5ML PO SUSP: 30.0000 mL | Freq: Every day | ORAL | Status: DC | PRN

## 2011-12-07 NOTE — Progress Notes (Signed)
Pt admitted voluntarily to unit with increasing anxiety and depression. Pt has a flat affect. Stated that she doesn't want to be a financial burden to her family. She doesn't list any other stressors at this time. She rates her depression 8/10 and hopelessness 8/10. Pt does have auditory and visual hallucinations. Pt has a past medical history of polycystic ovarian syndrome, Rhabdosarcoma, and cancer of abdominal wall. Pt denies SI/HI. Plan of care reviewed with patient. Pt belongings searched and skin assessed. Pt oriented to unit. No complaints of pain or discomfort at this time. Q15 min safety checks initiated. Will continue to monitor pt.

## 2011-12-07 NOTE — Progress Notes (Signed)
Psychoeducational Group Note  Date:  12/07/2011 Time:  1100  Group Topic/Focus:  Rediscovering Joy:   The focus of this group is to explore various ways to relieve stress in a positive manner.  Participation Level: Did Not Attend  Participation Quality:  Not Applicable  Affect:  Not Applicable  Cognitive:  Not Applicable  Insight:  Not Applicable  Engagement in Group: Not Applicable  Additional Comments:  Patient did not attend group.  Karleen Hampshire Brittini 12/07/2011, 5:13 PM

## 2011-12-07 NOTE — Progress Notes (Signed)
D:  Patient up and in the milieu some this morning, then stayed in her room for a while.  Did come to the unit in the middle of the night, so I allowed her to sleep some this morning and encouraged her to stay awake from noon on, so that she would be able to sleep tonight.  Has been compliant with medications and has attended some of the groups.   A:  Medication management per orders.  Support and encouragement given to patient.  Educated regarding sleep hygiene.   R:  Pleasant and cooperative with staff and peers.  Receptive to education.  Attending groups as she is able.

## 2011-12-07 NOTE — BHH Suicide Risk Assessment (Signed)
Suicide Risk Assessment  Admission Assessment     Nursing information obtained from:  Patient Demographic factors:  Adolescent or young adult;Unemployed Current Mental Status:  Suicidal ideation indicated by patient;Self-harm thoughts Loss Factors:  Decline in physical health;Financial problems / change in socioeconomic status Historical Factors:  NA Risk Reduction Factors:  Living with another person, especially a relative;Positive social support  CLINICAL FACTORS:   Bipolar Disorder:   Depressive phase Chronic Pain More than one psychiatric diagnosis  COGNITIVE FEATURES THAT CONTRIBUTE TO RISK: No evidence   SUICIDE RISK:   Moderate:  Frequent suicidal ideation with limited intensity, and duration, some specificity in terms of plans, no associated intent, good self-control, limited dysphoria/symptomatology, some risk factors present, and identifiable protective factors, including available and accessible social support.  PLAN OF CARE: Provide a safe environment/coping skills/stess management                              Reassess medications  Arthur Speagle A 12/07/2011, 10:49 AM
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
## 2011-12-07 NOTE — ED Notes (Signed)
Pt transported to behavioral health with security and tech, pt ambulatory with steady gait.
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
## 2011-12-07 NOTE — Progress Notes (Signed)
Aftercare Planning Group:  12/07/2011 8:30 AM  Patient rates depression, anxiety, hopelessness, and helplessness at nine. She endorses SI. Patient contracts for safety.  She states that she is uncomfortable with disclosing her stressors at this time.   [facility] Group Notes:  (Counselor/Nursing/MHT/Case Management/Adjunct)      Living a Balanced Life  1:15-2:30 PM     12/07/2011  1:15  Type of Therapy:  Group Therapy  Participation Level:  Minimal  Participation Quality:  Appropriate  Affect:  Blunted and Depressed  Cognitive:  Appropriate  Insight:  Limited  Engagement in Group:  Limited  Engagement in Therapy:  Limited  Modes of Intervention:  Education, Problem-solving and Support  Summary of Progress/Problems:  Patient listened very attentively to speaker from [employer].  She responded to speaker by sharing she is very loving and easy to get along with.  She also shared she loves dogs but offered no further participation in group.   Bournes, Roshelle L 12/07/2011, 11:12 AM

## 2011-12-07 NOTE — Progress Notes (Signed)
[facility] Adult Inpatient Family/Significant Other Suicide Prevention Education  Suicide Prevention Education:  Education Completed; patient's brother, Tina Saunders at telephone number [phone],  (name of family member/significant other) has been identified by the patient as the family member/significant other with whom the patient will be residing, and identified as the person(s) who will aid the patient in the event of a mental health crisis (suicidal ideations/suicide attempt).  With written consent from the patient, the family member/significant other has been provided the following suicide prevention education, prior to the and/or following the discharge of the patient.  The suicide prevention education provided includes the following:  Suicide risk factors  Suicide prevention and interventions  National Suicide Hotline telephone number  [facility] assessment telephone number  [facility] Emergency Assistance 911  [facility] and/or Residential Mobile Crisis Unit telephone number  Request made of family/significant other to:  Remove weapons (e.g., guns, rifles, knives), all items previously/currently identified as safety concern.    Remove drugs/medications (over-the-counter, prescriptions, illicit drugs), all items previously/currently identified as a safety concern.  The family member/significant other verbalizes understanding of the suicide prevention education information provided.  The family member/significant other agrees to remove the items of safety concern listed above.  Tina Saunders 12/07/2011, 1:17 PM

## 2011-12-07 NOTE — Progress Notes (Signed)
D: Patient came to the window at the beginning of the shift to complain of feeling anxious and more depressed. She said she just got off the phone with her mother and that her mother brought up some issues from her past which upset her. Her mood and affect sad and depressed. A: Writer encouraged and supported patient; checked if any prn for anxiety and encouraged patient to attend Karaoke as that would help take her mind off what ever issue or problem she's worried about. Writer unable to administer prn Vistaril because patient received a dose earlier less that 6 hours ago. R: Patient stated she would rather not talk about the issues in her past; but agreed to go to [place]. She endorsed passive SI but contracted for safety.

## 2011-12-07 NOTE — ED Notes (Signed)
Behavioral Health at bedside for pt re-evaluation and plan of care updated, sitter remains at bedside. Will continue to monitor pt.

## 2011-12-07 NOTE — Progress Notes (Signed)
Patient did not attend 10:00 am group.

## 2011-12-07 NOTE — ED Notes (Signed)
Report been called to RN Victorino Dike, security called for transport and pt belongings will be given to security for transfer to behavioral health facility.
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
## 2011-12-07 NOTE — Tx Team (Signed)
Initial Interdisciplinary Treatment Plan  PATIENT STRENGTHS: (choose at least two) Ability for insight Average or above average intelligence Capable of independent living Communication skills General fund of knowledge Supportive family/friends  PATIENT STRESSORS: Financial difficulties Health problems   PROBLEM LIST: Problem List/Patient Goals Date to be addressed Date deferred Reason deferred Estimated date of resolution  Depression 12/07/11     Suicidal Ideation 12/07/11                                                DISCHARGE CRITERIA:  Ability to meet basic life and health needs Improved stabilization in mood, thinking, and/or behavior Verbal commitment to aftercare and medication compliance  PRELIMINARY DISCHARGE PLAN: Attend aftercare/continuing care group Outpatient therapy Return to previous living arrangement  PATIENT/FAMIILY INVOLVEMENT: This treatment plan has been presented to and reviewed with the patient, Tina Saunders, and/or family member.  The patient and family have been given the opportunity to ask questions and make suggestions.  [name] T 12/07/2011, 3:48 AM

## 2011-12-08 MED ORDER — ONDANSETRON 4 MG PO TBDP
4.0000 mg | ORAL_TABLET | Freq: Four times a day (QID) | ORAL | Status: DC | PRN
  Administered 2011-12-08 – 2011-12-10 (×3): 4 mg via ORAL

## 2011-12-08 MED ORDER — ONDANSETRON 4 MG PO TBDP
ORAL_TABLET | ORAL | Status: AC
  Filled 2011-12-08: qty 1

## 2011-12-08 MED ORDER — HYDROXYZINE HCL 50 MG PO TABS
50.0000 mg | ORAL_TABLET | Freq: Four times a day (QID) | ORAL | Status: DC | PRN
  Administered 2011-12-08 – 2011-12-11 (×6): 50 mg via ORAL
  Filled 2011-12-08: qty 20

## 2011-12-08 NOTE — Progress Notes (Signed)
D: Patient denies HI and A/V hallucinations and admits to some on and off thoughts of SI and patient contracts for safety; patient reports sleep to be good; reports appetite to be good ; reports energy level is low ; reports ability to pay attention to poor; rates depression as 9/10; rates hopelessness 9/10; rates anxiety as 9/10;   A: Monitored q 15 minutes; patient encouraged to attend groups; patient educated about medications; patient given medications per physician orders; patient encouraged to express feelings and/or concerns; teaching of deep breathing excercises and prn medication  R: Patient is sad and depressed and forwards little information; patient's interaction with staff and peers is appropriate but minimal; patient was able to set goal to talk with staff 1:1 when having feelings of SI; patient is taking medications as prescribed and tolerating medications; patient is attending all groups
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
## 2011-12-08 NOTE — Progress Notes (Signed)
Patient attended 11 am Psychoeducational group.  Group consists of therapeutic question ball along with long and short term goals

## 2011-12-09 MED ORDER — GABAPENTIN 300 MG PO CAPS
300.0000 mg | ORAL_CAPSULE | Freq: Three times a day (TID) | ORAL | Status: DC
  Administered 2011-12-09 – 2011-12-11 (×6): 300 mg via ORAL
  Filled 2011-12-09: qty 21
  Filled 2011-12-09: qty 1
  Filled 2011-12-09: qty 21
  Filled 2011-12-09 (×5): qty 1
  Filled 2011-12-09: qty 21
  Filled 2011-12-09 (×4): qty 1

## 2011-12-09 MED ORDER — POLYETHYLENE GLYCOL 3350 17 G PO PACK
17.0000 g | PACK | Freq: Every day | ORAL | Status: DC
  Administered 2011-12-09 – 2011-12-11 (×3): 17 g via ORAL
  Filled 2011-12-09 (×6): qty 1

## 2011-12-09 NOTE — Progress Notes (Signed)
D: Pt complained of RLQ abdominal pain at 2200.  Description: stabbing.  Exam: abdomen tender, obese, no guarding or rebound tenderness.  BS active in all 4 quadrants.  Afebrile, T=97.12F.  BP 153/76, HR 89, RR 16.  Previous nausea during shift.  A: Med on-call notified of above exam findings.  Ordered Zofran 4mg  PO and warm ginger ale and continue to monitor.  These were given to pt and Pt was directed to rest in bed.  R: Pt approached med window at 2240, stated she had passed "a large amount of gas" and that her stomach pain was completely resolved.  Pt felt confident that she was "fine now" and returned to bed. Dion Saucier RN
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
## 2011-12-09 NOTE — Clinical Social Work Note (Signed)
[facility] Group Notes:  (Clinical Social Work)  12/09/2011   3:00-4:00PM  Summary of Progress/Problems:   The main focus of today's process group was for the patient to identify ways in which they have in the past sabotaged their own recovery and reasons they may have done this/what they received from doing it.  We then worked to identify a specific plan to avoid doing this when discharged from the hospital for this admission.  The patient expressed that she lives with her brother and stays home all day, isolating herself completely to her room.  In the past she has taken care of cooking and cleaning, but had stopped taking her medications.  Patient has talked with brother, who is going to take care of medications from now on.  She also will be looking for things to do around the house, and agreed she may talk to brother about keeping her accountable for this.  Type of Therapy:  Group Therapy - Process  Participation Level:  Active  Participation Quality:  Appropriate, Attentive, Sharing and while in the room, but did leave for a long time, then returned  Affect:  Blunted  Cognitive:  Appropriate and Oriented  Insight:  Good  Engagement in Group:  Good  Engagement in Therapy:  Good  Modes of Intervention:  Clarification, Education, Limit-setting, Problem-solving, Socialization, Support and Processing   Ambrose Mantle, LCSW 12/09/2011  5:50 PM

## 2011-12-09 NOTE — Progress Notes (Signed)
Pt asleep in bed. Respirations even and unlabored. Q15 min safety checks maintained. Will continue to monitor.  

## 2011-12-09 NOTE — Progress Notes (Signed)
Group Note  Date:  11//16//2013 Time: 0915  Group Topic/Focus:  Identifying Goals: The group focuses on helping the patients identify goals  that they want to attain here, it  Introduces them to their Saturday Patient Workbook and helps motivate them to begin to put into place the changes they need to achieve their goals. Participation Level:  active Participation Quality: good Affect: flat Cognitive:  good  Insight:  good  Engagement in Group: engaged  Additional Comments:   PD RN BC 1000  

## 2011-12-09 NOTE — Progress Notes (Signed)
Met with pt at start of shift who complained of nausea. Relief obtained with zofran prn. She presents with a fixed smile and anxious affect with congruent mood. Eye contact is brief and pt forwards little information. She requested and was given vistaril and ambien for anxiety and sleep. Given support and encouragement. Denies SI/HI/AVH at present and verbally contracts for safety. Lawrence Marseilles

## 2011-12-09 NOTE — Progress Notes (Signed)
Psychoeducational Group Note  Date:  12/09/2011 Time:  2000  Group Topic/Focus:  Wrap-Up Group:   The focus of this group is to help patients review their daily goal of treatment and discuss progress on daily workbooks.  Participation Level:  Active  Participation Quality:  Appropriate  Affect:  Appropriate  Cognitive:  Appropriate  Insight:  Good  Engagement in Group:  Good  Additional Comments:    Tina Saunders A 12/09/2011, 4:45 AM
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
## 2011-12-09 NOTE — Progress Notes (Signed)
Psychoeducational Group Note  Date:  12/09/2011 Time: 1315  Group Topic/Focus:  Healthy Communication:   The focus of this group is to discuss communication, barriers to communication, as well as healthy ways to communicate with others.  Participation Level:  Did Not Attend  Participation Quality:    Affect:    Cognitive:    Insight:    Engagement in Group:    Additional Comments:  none  Tinsleigh Slovacek M 12/09/2011, 2:25 PM

## 2011-12-09 NOTE — Progress Notes (Signed)
Psychoeducational Group Note  Date: 12/09/2011 Time:  1015  Group Topic/Focus:  Identifying Needs:   The focus of this group is to help patients identify their personal needs that have been historically problematic and identify healthy behaviors to address their needs.  Participation Level:  Active  Participation Quality:  Appropriate  Affect:  Appropriate  Cognitive:  Appropriate  Insight:  Good  Engagement in Group:  Good  Additional Comments:    Eastyn Dattilo A  

## 2011-12-10 NOTE — Clinical Social Work Note (Signed)
[facility] Group Notes:  (Clinical Social Work)  12/10/2011   3:00-4:00PM  Summary of Progress/Problems:   The main focus of today's process group was for the patient to define "support" and describe what healthy supports are, then to identify the patient's current support system and decide on other supports that can be put in place to prevent future hospitalizations.  An emphasis was placed on using therapist, doctor and problem-specific support groups to expand supports. The patient expressed that her brother and mother are her supports, along with her therapist.  She is willing to try going to the [facility] and/or support groups.  She came into group late and was not as actively involved as a result.  Type of Therapy:  Group Therapy  Participation Level:  Minimal  Participation Quality:  Appropriate and Sharing  Affect:  Blunted  Cognitive:  Appropriate and Oriented  Insight:  Limited  Engagement in Group:  Limited  Engagement in Therapy:  Limited  Modes of Intervention:  Clarification, Education, Limit-setting, Problem-solving, Socialization, Support and Processing   Ambrose Mantle, LCSW 12/10/2011, 4:37 PM

## 2011-12-10 NOTE — Progress Notes (Signed)
Group Topic/Focus:  Making Healthy Choices:   The focus of this group is to help patients identify negative/unhealthy choices they were using prior to admission and identify positive/healthier coping strategies to replace them upon discharge.  Participation Level:  Active  Participation Quality:  Appropriate  Affect:  Appropriate  Cognitive:  Appropriate  Insight:  Good  Engagement in Group:  Good  Additional Comments:    Vianne Grieshop A 12/10/2011   

## 2011-12-10 NOTE — Progress Notes (Signed)
Patient ID: Tina Saunders, female   DOB: [birth year], 26 y.o.   MRN: [id number] [facility] MD Progress Note  12/10/2011 1:54 PM KATHIE SWARR  MRN:  [id number]  S: "I am still hurting badly all over today. But, mentally, I feel a lot better. I think my medications are about to kick in"   O: Negative for fever.  HENT: Negative for congestion and rhinorrhea.  Respiratory: Negative for cough, chest tightness and shortness of breath.  Cardiovascular: Negative for chest pain.  Gastrointestinal: Negative for nausea, vomiting and abdominal pain, however, complained of constipation. Skin: Negative for rash.  Neurological: Negative for weakness and headaches.  Diagnosis:   Axis I: Bipolar disorder, current episode depressed, severe, with psychotic features Axis II: Deferred Axis III:  Past Medical History  Diagnosis Date  . Polycystic ovarian syndrome 07/01/2011    Patient report  . Anxiety   . Depression   . Cancer of abdominal wall   . Rhabdosarcoma    Axis IV: other psychosocial or environmental problems Axis V: 51-60 moderate symptoms  ADL's:  Intact  Sleep: Good  Appetite:  Good  Suicidal Ideation: "No" Plan:  No Intent:  No Means:  No Homicidal Ideation: "no' Plan:  No Intent:  No Means:  No  AEB (as evidenced by): Per patient's reports  Mental Status Examination/Evaluation: Objective:  Appearance: Casual, Obese  Eye Contact::  Good  Speech:  Clear and Coherent  Volume:  Normal  Mood:  Depressed  Affect:  Flat  Thought Process:  Coherent  Orientation:  Full  Thought Content:  Rumination  Suicidal Thoughts:  No  Homicidal Thoughts:  No  Memory:  Immediate;   Good Recent;   Good Remote;   Good  Judgement:  Fair  Insight:  Fair  Psychomotor Activity:  Normal  Concentration:  Good  Recall:  Good  Akathisia:  No  Handed:  Right  AIMS (if indicated):     Assets:  Desire for Improvement  Sleep:  Number of Hours: 6.75    Vital Signs:Blood pressure 100/77, pulse  84, temperature 98.4 F (36.9 C), temperature source Oral, resp. rate 16, height 5\' 2"  (1.575 m), weight 113.853 kg (251 lb), last menstrual period 11/07/2011. Current Medications: Current Facility-Administered Medications  Medication Dose Route Frequency Provider Last Rate Last Dose  . acetaminophen (TYLENOL) tablet 650 mg  650 mg Oral Q6H PRN Kerry Hough, PA      . alum & mag hydroxide-simeth (MAALOX/MYLANTA) 200-200-20 MG/5ML suspension 30 mL  30 mL Oral Q4H PRN Kerry Hough, PA   30 mL at 12/08/11 0222  . famotidine (PEPCID) tablet 20 mg  20 mg Oral BID Rachael Fee, MD   20 mg at 12/10/11 0929  . gabapentin (NEURONTIN) capsule 300 mg  300 mg Oral TID Sanjuana Kava, NP   300 mg at 12/10/11 1309  . hydrOXYzine (ATARAX/VISTARIL) tablet 50 mg  50 mg Oral Q6H PRN Shuvon Rankin, NP   50 mg at 12/09/11 2148  . magnesium hydroxide (MILK OF MAGNESIA) suspension 30 mL  30 mL Oral Daily PRN Kerry Hough, PA      . naproxen (NAPROSYN) tablet 500 mg  500 mg Oral BID Rachael Fee, MD   500 mg at 12/10/11 0929  . ondansetron (ZOFRAN-ODT) disintegrating tablet 4 mg  4 mg Oral Q6H PRN Nanine Means, NP   4 mg at 12/09/11 2001  . polyethylene glycol (MIRALAX / GLYCOLAX) packet 17 g  17 g Oral  Daily Sanjuana Kava, NP   17 g at 12/10/11 1610  . promethazine (PHENERGAN) suppository 25 mg  25 mg Rectal Q6H PRN Rachael Fee, MD      . QUEtiapine (SEROQUEL XR) 24 hr tablet 600 mg  600 mg Oral QHS Rachael Fee, MD   600 mg at 12/09/11 2147  . sertraline (ZOLOFT) tablet 100 mg  100 mg Oral QPM Rachael Fee, MD   100 mg at 12/09/11 1813  . zolpidem (AMBIEN) tablet 10 mg  10 mg Oral QHS PRN Rachael Fee, MD   10 mg at 12/09/11 2149    Lab Results: No results found for this or any previous visit (from the past 48 hour(s)).  Physical Findings: AIMS: Facial and Oral Movements Muscles of Facial Expression: None, normal Lips and Perioral Area: None, normal Jaw: None, normal Tongue: None,  normal,Extremity Movements Upper (arms, wrists, hands, fingers): None, normal Lower (legs, knees, ankles, toes): None, normal, Trunk Movements Neck, shoulders, hips: None, normal, Overall Severity Severity of abnormal movements (highest score from questions above): None, normal Incapacitation due to abnormal movements: None, normal Patient's awareness of abnormal movements (rate only patient's report): No Awareness, Dental Status Current problems with teeth and/or dentures?: No Does patient usually wear dentures?: No  CIWA:  CIWA-Ar Total: 0  COWS:  COWS Total Score: 0   Treatment Plan Summary: Daily contact with patient to assess and evaluate symptoms and progress in treatment Medication management  Plan: No new changes made on current treatment plan. No significant adverse effects and or reactions from treatment regimen. Encouraged group counseling participation. Continue current treatment plan.  [name] 12/10/2011, 1:54 PM

## 2011-12-10 NOTE — Progress Notes (Signed)
Did not attend 1 pm psycho educational group  

## 2011-12-10 NOTE — Progress Notes (Signed)
Met with pt 1:1 who remains anxious and depressed in affect and mood. States she is constipated and has not had a BM since 11/9 however chart indicates 11/15. Reports daily miralax is not helping. Offered MOM but pt refused. Also offered to call provider to eval possible interventions such as mag citrate and/or enema. Pt refused all options stating, "I usually have to go to the hospital where they stop all food and have me drink miralax continuously." Enc pt to consider these other options first but pt unwilling. Pt up and around milieu, interacting with staff and peers. No evidence of pain and discomfort observed. Will continue to monitor and enc pt to discuss with provider tomorrow. Denies SI/HI/AVH and is safe. Lawrence Marseilles

## 2011-12-11 MED ORDER — FAMOTIDINE 20 MG PO TABS: 20.0000 mg | ORAL_TABLET | Freq: Two times a day (BID) | ORAL | Status: DC

## 2011-12-11 MED ORDER — NAPROXEN 500 MG PO TABS: 500.0000 mg | ORAL_TABLET | Freq: Two times a day (BID) | ORAL | Status: DC

## 2011-12-11 MED ORDER — SERTRALINE HCL 100 MG PO TABS: 100.0000 mg | ORAL_TABLET | Freq: Every evening | ORAL | Status: DC

## 2011-12-11 MED ORDER — GABAPENTIN 300 MG PO CAPS: 300.0000 mg | ORAL_CAPSULE | Freq: Three times a day (TID) | ORAL | Status: DC

## 2011-12-11 MED ORDER — QUETIAPINE FUMARATE ER 300 MG PO TB24: 600.0000 mg | ORAL_TABLET | Freq: Every day | ORAL | Status: DC

## 2011-12-11 MED ORDER — HYDROXYZINE HCL 50 MG PO TABS: 50.0000 mg | ORAL_TABLET | Freq: Four times a day (QID) | ORAL | Status: DC | PRN

## 2011-12-11 NOTE — Tx Team (Signed)
Interdisciplinary Treatment Plan Update (Adult)  Date:  12/11/2011  Time Reviewed:  9:59 AM   Progress in Treatment: Attending groups:   Yes   Participating in groups:  Yes Taking medication as prescribed:  Yes Tolerating medication:  Yes Family/Significant othe contact made: Contact made with family Patient understands diagnosis:  Yes Discussing patient identified problems/goals with staff: Yes Medical problems stabilized or resolved: Yes Denies suicidal/homicidal ideation:Yes Issues/concerns per patient self-inventory:  Other:  New problem(s) identified:  Reason for Continuation of Hospitalization: Anxiety Depression Medication stabilization   Interventions implemented related to continuation of hospitalization:  Medication Management; safety checks q 15 mins  Additional comments:  Estimated length of stay:  1-2 days  Discharge Plan:  Home with outpatient follow up  New goal(s):  Review of initial/current patient goals per problem list:    1.  Goal(s): Eliminate SI/other thoughts of self harm   Met:  Yes  Target date: d/c  As evidenced by: Patient no longer endorsing SI/HI or other thoughts of self harm.    2.  Goal (s):Reduce depression/anxiety  (rated at three today)  Met: Yes  Target date: d/c  As evidenced by: Patient currently rating symptoms at four or below    3.  Goal(s):.stabilize on meds   Met:  No  Target date: d/c  As evidenced by: Patient reports being stabilized on medications - less symptomatic    4.  Goal(s): Refer for outpatient follow up   Met:  Yes  Target date: d/c  As evidenced by: Follow up appointment scheduled    Attendees: Patient:   12/11/2011 9:59 AM  Physican:  Patrick North, MD 12/11/2011 9:59 AM  Nursing:  Neill Loft, RN 12/11/2011 9:59 AM   Nursing:   Quintella Reichert, RN 12/11/2011 9:59 AM   Clinical Social Worker:  Juline Patch, LCSW 12/11/2011 9:59 AM   Other: Serena Colonel, FNP 12/11/2011  9:59 AM   Other:           12/11/2011 12/11/2011 Other:         12/11/2011 9:59 AM

## 2011-12-11 NOTE — Progress Notes (Signed)
Pt d/c from hospital with her brother. All items returned. D/C instructions given, samples given and prescriptions given. Pt denies si and hi.

## 2011-12-11 NOTE — [facility] Suicide Risk Assessment (Signed)
Suicide Risk Assessment  Discharge Assessment     Demographic Factors:  Female, caucasian, single  Mental Status Per Nursing Assessment::   On Admission:  Suicidal ideation indicated by patient;Self-harm thoughts  Current Mental Status by Physician: Patient alert and oriented to 4. Mood is improved. Denies AH/VH/SI/HI.  Loss Factors: Decline in physical health  Historical Factors: Family history of mental illness or substance abuse and Impulsivity  Risk Reduction Factors:   Living with another person, especially a relative and Positive social support  Continued Clinical Symptoms:  Bipolar Disorder:   Depressive phase  Cognitive Features That Contribute To Risk:  Cognitively intact   Suicide Risk:  Minimal: No identifiable suicidal ideation.  Patients presenting with no risk factors but with morbid ruminations; may be classified as minimal risk based on the severity of the depressive symptoms  Discharge Diagnoses:   AXIS I:  Bipolar, Depressed AXIS II:  No diagnosis AXIS III:   Past Medical History  Diagnosis Date  . Polycystic ovarian syndrome 07/01/2011    Patient report  . Anxiety   . Depression   . Cancer of abdominal wall   . Rhabdosarcoma    AXIS IV:  economic problems and occupational problems AXIS V:  61-70 mild symptoms  Plan Of Care/Follow-up recommendations:  Activity:  as tolerated Diet:  normal Follow up with Vesta Mixer on December 10th.  Is patient on multiple antipsychotic therapies at discharge:  No   Has Patient had three or more failed trials of antipsychotic monotherapy by history:  No  Recommended Plan for Multiple Antipsychotic Therapies: NA  Tina Saunders 12/11/2011, 11:08 AM

## 2011-12-11 NOTE — Discharge Summary (Signed)
Physician Discharge Summary Note  Patient:  Tina Saunders is an 26 y.o., female MRN:  [id number] DOB:  10-18-1985 Patient phone:  [phone] (home)  Patient address:   [address] [place] [address],   Date of Admission:  12/07/2011 Date of Discharge: 12/11/11  Reason for Admission: Suicidal ideation with psychotic features.  Discharge Diagnoses: Active Problems:  Bipolar disorder, current episode depressed, severe, with psychotic features  Post traumatic stress disorder   Axis Diagnosis:   AXIS I:  Bipolar disorder, current episode depressed, severe, with psychotic features AXIS II:  Deferred AXIS III:   Past Medical History  Diagnosis Date  . Polycystic ovarian syndrome 07/01/2011    Patient report  . Anxiety   . Depression   . Cancer of abdominal wall   . Rhabdosarcoma    AXIS IV:  other psychosocial or environmental problems AXIS V:  62  Level of Care:  OP  Hospital Course: " Had thoughts of killing myself." Sees and hears things that people are saying are not there. Voices tell her to hurt hersel. Going on for 3-4 months. (Uncel committed suicide) Two weeks ago stared Cutting herself as the voices were telling her to do it. Says she gets relief from cutting. See images, people, fully awake or sometimes asleep, gets a feeling there is someone by her side, wakes up screams. talk to her telling her to come with them. Lots of anxiety. Underlying anxiety, builds up to panic attacks, every day three or four times a day. Takes care of best friend's daughter.She does feel better when she is taking of her. She admits to feelings of unreality, dissociation".   While a patient in this hospital, Tina Saunders received medication management for her major depression with psychotic symptoms. She was prescribed and received Seroquel XR 600 mg for mood control, Sertraline 100 mg for depression, Gabapentin 300 mg tid for anxiety/pain, Hydroxyzine 50 mg for anxiety and Zolpidem 10 mg for  sleep. She also was enrolled in group counseling sessions and activities to learn coping skills that will help her cope with her symptoms after discharge. She also received medication management and monitoring for her other medical issues and concerns. She tolerated her treatment regimen without any significant adverse effects and or reactions presented.   Patient did respond positively to her treatment regimen. This is evidenced by her daily reports of improved mood, reduction of symptoms and presentation of good affect/eyecontact. She attended treatment team meeting this am and met with her treatment team members. Her reason for admission, symptoms, response to treatment and discharge plans discussed with patient. Tina Saunders endorsed that her symptoms has stabilized and that she she is ready for discharge. It was agreed upon that patient will follow-up care at [facility] on 12/14/11 @ 6:00 PM. She will be seen by Kermit Balo. The address, date and time for this appointment provided for patient.  Upon discharge, Tina Saunders adamantly denies any suicidal, homicidal ideations, auditory, visual hallucinations and or delusional thinking. She was provided with 4 days worth supply samples of her [facility] discharge medications. She left [facility] with all personal belongings via personal arranged transport in no apparent distress.      Consults:  None  Significant Diagnostic Studies:  labs: CBC with diff, CMP, UDS, Toxicology  Discharge Vitals:   Blood pressure 110/80, pulse 79, temperature 98.3 F (36.8 C), temperature source Oral, resp. rate 16, height 5\' 2"  (1.575 m), weight 113.853 kg (251 lb), last menstrual period 11/07/2011. Lab Results:  No results found for this or any previous visit (from the past 72 hour(s)).  Physical Findings: AIMS: Facial and Oral Movements Muscles of Facial Expression: None, normal Lips and Perioral Area: None, normal Jaw: None, normal Tongue: None, normal,Extremity Movements Upper  (arms, wrists, hands, fingers): None, normal Lower (legs, knees, ankles, toes): None, normal, Trunk Movements Neck, shoulders, hips: None, normal, Overall Severity Severity of abnormal movements (highest score from questions above): None, normal Incapacitation due to abnormal movements: None, normal Patient's awareness of abnormal movements (rate only patient's report): No Awareness, Dental Status Current problems with teeth and/or dentures?: No Does patient usually wear dentures?: No  CIWA:  CIWA-Ar Total: 0  COWS:  COWS Total Score: 0   Mental Status Exam: See Mental Status Examination and Suicide Risk Assessment completed by Attending Physician prior to discharge.  Discharge destination:  Home  Is patient on multiple antipsychotic therapies at discharge:  No   Has Patient had three or more failed trials of antipsychotic monotherapy by history:  No  Recommended Plan for Multiple Antipsychotic Therapies: NA     Medication List     As of 12/11/2011  2:44 PM    STOP taking these medications         promethazine 25 MG suppository   Commonly known as: PHENERGAN      TAKE these medications      Indication    famotidine 20 MG tablet   Commonly known as: PEPCID   Take 1 tablet (20 mg total) by mouth 2 (two) times daily. For acid reflux       gabapentin 300 MG capsule   Commonly known as: NEURONTIN   Take 1 capsule (300 mg total) by mouth 3 (three) times daily. For anxiety/pain       hydrOXYzine 50 MG tablet   Commonly known as: ATARAX/VISTARIL   Take 1 tablet (50 mg total) by mouth every 6 (six) hours as needed for anxiety (anxiety). For anxiety    Indication: Anxiety Neurosis, Anxiety associated with Organic Disease      naproxen 500 MG tablet   Commonly known as: NAPROSYN   Take 1 tablet (500 mg total) by mouth 2 (two) times daily. For pain management       QUEtiapine 300 MG 24 hr tablet   Commonly known as: SEROQUEL XR   Take 2 tablets (600 mg total) by mouth at  bedtime. For mood control       sertraline 100 MG tablet   Commonly known as: ZOLOFT   Take 1 tablet (100 mg total) by mouth every evening. For depression          Follow-up Information    Follow up with [facility]. On 12/14/2011. (Appt.scheduled for Thursday November 21st at  6 :00 PM with Kermit Balo)    Contact information:   [address] [place], [place] [address] [phone]         Follow-up recommendations:  Activity:  as tolerated Other:  Keep all scheduled follow-up appointments as recommended.    Comments:  Take all your medications as prescribed by your mental healthcare provider. Report any adverse effects and or reactions from your medicines to your outpatient provider promptly. Patient is instructed and cautioned to not engage in alcohol and or illegal drug use while on prescription medicines. In the event of worsening symptoms, patient is instructed to call the crisis hotline, 911 and or go to the nearest ED for appropriate evaluation and treatment of symptoms. Follow-up with  your primary care provider for your other medical issues, concerns and or health care needs.     SignedArmandina Stammer I 12/11/2011, 2:44 PM

## 2011-12-11 NOTE — Progress Notes (Signed)
[facility] Case Management Discharge Plan:  Will you be returning to the same living situation after discharge: Yes,    At discharge, do you have transportation home?:Yes,    Do you have the ability to pay for your medications:Yes,     Interagency Information:     Release of information consent forms completed and in the chart;  Patient's signature needed at discharge.  Patient to Follow up at:  Follow-up Information    Follow up with [facility]. On 12/14/2011. (Appt.scheduled for Thursday November 21st at  6 :00 PM with Kermit Balo)    Contact information:   [address] [place], [place] [address] [phone]         Patient denies SI/HI:   Yes,       Safety Planning and Suicide Prevention discussed:  Yes,     Barrier to discharge identified:Yes,     Summary and Recommendations: Patient discharged home with followup care scheduled with Russella Dar 12/11/2011, 2:37 PM

## 2011-12-11 NOTE — Progress Notes (Addendum)
D:  Patient's self inventory form, patient needs sleep medications,  Has good appetite, low energy level, poor attention span.  Rated depression #4, hopelessness #1.  Denied withdrawals.  Denied SI.  Has felt pain in past 24 hours, "I am not feeling well."  Worst pain #8.  "Have things to do instead of sitting at home.  I am ready to go home."  No problems taking meds after discharge. A:  Medications given per MD order.  Support and encouragement given throughout day.  Support and safety checks completed as ordered.   R:  Following treatment plan.  Denied SI and HI.  Denied A/V hallucinations.  Contracts for safety.  Patient remains safe and receptive on unit. Patient took miralax scheduled this morning.  Stated she has not had BM in several says.  Refused milk of magnesium.  Will discuss constipation with MD this morning.

## 2011-12-11 NOTE — Progress Notes (Signed)
Psychoeducational Group Note  Date:  12/11/2011 Time:  1100  Group Topic/Focus:  Self Care:   The focus of this group is to help patients understand the importance of self-care in order to improve or restore emotional, physical, spiritual, interpersonal, and financial health.  Participation Level:  Did Not Attend  Meredith Staggers 12/11/2011, 12:28 PM

## 2011-12-12 NOTE — Discharge Summary (Signed)
Reviewed

## 2011-12-15 NOTE — Progress Notes (Signed)
Patient Discharge Instructions:  After Visit Summary (AVS):   Faxed to:  12/15/11 Psychiatric Admission Assessment Note:   Faxed to:  12/15/11 Suicide Risk Assessment - Discharge Assessment:   Faxed to:  12/15/11 Faxed/Sent to the Next Level Care provider:  12/15/11 Faxed to [facility] @ [phone] Tina Saunders, 12/15/2011, 2:33 PM

## 2012-01-24 ENCOUNTER — Encounter (HOSPITAL_COMMUNITY): Payer: Self-pay | Admitting: *Deleted

## 2012-01-24 ENCOUNTER — Emergency Department (HOSPITAL_COMMUNITY)
Admission: EM | Admit: 2012-01-24 | Discharge: 2012-01-25 | Disposition: A | Discharge status: Home / Self Care | Payer: Self-pay | Attending: Emergency Medicine | Admitting: Emergency Medicine

## 2012-01-24 DIAGNOSIS — F411 Generalized anxiety disorder: Secondary | ICD-10-CM | POA: Insufficient documentation

## 2012-01-24 DIAGNOSIS — F329 Major depressive disorder, single episode, unspecified: Secondary | ICD-10-CM | POA: Insufficient documentation

## 2012-01-24 DIAGNOSIS — Z8742 Personal history of other diseases of the female genital tract: Secondary | ICD-10-CM | POA: Insufficient documentation

## 2012-01-24 DIAGNOSIS — Z79899 Other long term (current) drug therapy: Secondary | ICD-10-CM | POA: Insufficient documentation

## 2012-01-24 DIAGNOSIS — M2550 Pain in unspecified joint: Secondary | ICD-10-CM | POA: Insufficient documentation

## 2012-01-24 DIAGNOSIS — Z8589 Personal history of malignant neoplasm of other organs and systems: Secondary | ICD-10-CM | POA: Insufficient documentation

## 2012-01-24 DIAGNOSIS — F3289 Other specified depressive episodes: Secondary | ICD-10-CM | POA: Insufficient documentation

## 2012-01-24 NOTE — ED Notes (Signed)
Pt. Reports sharp pain all over body x2 months. States normally takes neurotin but has not helped. Reports diarrhea x1 week with dizzy spells and a dry mouth. Denies fever, cough, chills.

## 2012-01-24 NOTE — ED Notes (Addendum)
Pt states generalized body pain for the past month. Pt states that she is on neurotin and it doesn't seem to be hurting. Pt ambulatory. Pt denies cough, fever, chills.

## 2012-01-25 MED ORDER — ONDANSETRON 4 MG PO TBDP
4.0000 mg | ORAL_TABLET | Freq: Once | ORAL | Status: AC
  Administered 2012-01-25: 4 mg via ORAL
  Filled 2012-01-25 (×2): qty 1

## 2012-01-25 MED ORDER — HYDROMORPHONE HCL PF 1 MG/ML IJ SOLN
1.0000 mg | Freq: Once | INTRAMUSCULAR | Status: AC
  Administered 2012-01-25: 1 mg via INTRAMUSCULAR
  Filled 2012-01-25: qty 1

## 2012-01-25 MED ORDER — NAPROXEN 500 MG PO TABS: 500.0000 mg | ORAL_TABLET | Freq: Two times a day (BID) | ORAL | Status: DC

## 2012-01-25 MED ORDER — IBUPROFEN 800 MG PO TABS
800.0000 mg | ORAL_TABLET | Freq: Once | ORAL | Status: AC
  Administered 2012-01-25: 800 mg via ORAL
  Filled 2012-01-25: qty 1

## 2012-01-25 NOTE — ED Notes (Signed)
Pt. Stable. No distress noted.  

## 2012-01-25 NOTE — ED Provider Notes (Signed)
History     CSN: [id number]  Arrival date & time 01/24/12  2242   First MD Initiated Contact with Patient 01/24/12 2356      Chief Complaint  Patient presents with  . Generalized Body Aches    months on neuortin for pain     (Consider location/radiation/quality/duration/timing/severity/associated sxs/prior treatment) HPI Hx per PT, body aches all over, no F/C, no cough or sore throat, takes neurontin for this but it is not helping tonight. Hurts legs and arms mostly. No dysuria, no CP or SOB. No ABd pain.  Symptoms do feel like her typical recurrent symptoms. Mod in severity. Usually dilaudid helps when she gets like this. Past Medical History  Diagnosis Date  . Polycystic ovarian syndrome 07/01/2011    Patient report  . Anxiety   . Depression   . Cancer of abdominal wall   . Rhabdosarcoma     Past Surgical History  Procedure Date  . Cholecystectomy   . Varicose vein surgery   . Ovarian cyst excision     Family History  Problem Relation Age of Onset  . Coronary artery disease Maternal Grandmother   . Diabetes type II Maternal Grandmother   . Cancer Maternal Grandmother   . Hypertension Mother   . Hypertension Father     History  Substance Use Topics  . Smoking status: Never Smoker   . Smokeless tobacco: Not on file  . Alcohol Use: No    OB History    Grav Para Term Preterm Abortions TAB SAB Ect Mult Living                  Review of Systems  Constitutional: Negative for fever and chills.  HENT: Negative for neck pain and neck stiffness.   Eyes: Negative for pain.  Respiratory: Negative for shortness of breath.   Cardiovascular: Negative for chest pain.  Gastrointestinal: Negative for abdominal pain.  Genitourinary: Negative for dysuria.  Musculoskeletal: Positive for arthralgias. Negative for back pain, joint swelling and gait problem.  Skin: Negative for rash.  Neurological: Negative for headaches.  All other systems reviewed and are  negative.    Allergies  Morphine and related  Home Medications   Current Outpatient Rx  Name  Route  Sig  Dispense  Refill  . FAMOTIDINE 20 MG PO TABS   Oral   Take 1 tablet (20 mg total) by mouth 2 (two) times daily. For acid reflux   60 tablet   0   . GABAPENTIN 300 MG PO CAPS   Oral   Take 1 capsule (300 mg total) by mouth 3 (three) times daily. For anxiety/pain   90 capsule   0   . HYDROXYZINE HCL 50 MG PO TABS   Oral   Take 1 tablet (50 mg total) by mouth every 6 (six) hours as needed for anxiety (anxiety). For anxiety   120 tablet   0   . NAPROXEN 500 MG PO TABS   Oral   Take 1 tablet (500 mg total) by mouth 2 (two) times daily. For pain management   30 tablet   0   . QUETIAPINE FUMARATE ER 300 MG PO TB24   Oral   Take 2 tablets (600 mg total) by mouth at bedtime. For mood control   60 tablet   0   . QUETIAPINE FUMARATE ER 50 MG PO TB24   Oral   Take 100 mg by mouth at bedtime. Take with the 300 mg         .  SERTRALINE HCL 100 MG PO TABS   Oral   Take 1 tablet (100 mg total) by mouth every evening. For depression   30 tablet   0   . SERTRALINE HCL 25 MG PO TABS   Oral   Take 25 mg by mouth daily. Take with the 100mg            BP 141/80  Pulse 107  Temp 97.9 F (36.6 C) (Oral)  Resp 18  SpO2 94%  Physical Exam  Constitutional: She is oriented to person, place, and time. She appears well-developed and well-nourished.  HENT:  Head: Normocephalic and atraumatic.  Eyes: Conjunctivae normal and EOM are normal. Pupils are equal, round, and reactive to light.  Neck: Trachea normal. Neck supple. No thyromegaly present.  Cardiovascular: Normal rate, regular rhythm, S1 normal, S2 normal and normal pulses.     No systolic murmur is present   No diastolic murmur is present  Pulses:      Radial pulses are 2+ on the right side, and 2+ on the left side.  Pulmonary/Chest: Effort normal and breath sounds normal. She has no wheezes. She has no  rhonchi. She has no rales. She exhibits no tenderness.  Abdominal: Soft. Normal appearance and bowel sounds are normal. There is no tenderness. There is no CVA tenderness and negative Murphy's sign.  Musculoskeletal:       BLE's:  Calves nontender, no cords or erythema, negative Homans sign.   No point tenderness with N/V intact x 4. No deformities or joint swelling  Neurological: She is alert and oriented to person, place, and time. She has normal strength. No cranial nerve deficit or sensory deficit. GCS eye subscore is 4. GCS verbal subscore is 5. GCS motor subscore is 6.  Skin: Skin is warm and dry. No rash noted. She is not diaphoretic.  Psychiatric: Her speech is normal.       Cooperative and appropriate    ED Course  Procedures (including critical care time)   IM Dilaudid On recheck, symptoms improved. Old records reviewed, VS reviewed. No indication for labs, imaging or further ED work up at this time.   MDM   Body aches with h/o same, her neurontin is not helping. PT requesting dilaudid which did improve her symptoms. Serial exams - plan d/c home and close outpatient follow up.         Sunnie Nielsen, MD 01/28/12 1018

## 2012-01-25 NOTE — ED Notes (Signed)
Pt. Dropped 1st zofran on floor, pulled 2nd pill and given to pt.

## 2012-02-20 ENCOUNTER — Emergency Department (HOSPITAL_COMMUNITY): Payer: Self-pay

## 2012-02-20 ENCOUNTER — Encounter (HOSPITAL_COMMUNITY): Payer: Self-pay | Admitting: *Deleted

## 2012-02-20 ENCOUNTER — Emergency Department (HOSPITAL_COMMUNITY)
Admission: EM | Admit: 2012-02-20 | Discharge: 2012-02-20 | Disposition: A | Discharge status: Home / Self Care | Payer: Self-pay | Attending: Emergency Medicine | Admitting: Emergency Medicine

## 2012-02-20 DIAGNOSIS — R1110 Vomiting, unspecified: Secondary | ICD-10-CM | POA: Insufficient documentation

## 2012-02-20 DIAGNOSIS — F329 Major depressive disorder, single episode, unspecified: Secondary | ICD-10-CM | POA: Insufficient documentation

## 2012-02-20 DIAGNOSIS — F411 Generalized anxiety disorder: Secondary | ICD-10-CM | POA: Insufficient documentation

## 2012-02-20 DIAGNOSIS — M791 Myalgia, unspecified site: Secondary | ICD-10-CM

## 2012-02-20 DIAGNOSIS — Z8589 Personal history of malignant neoplasm of other organs and systems: Secondary | ICD-10-CM | POA: Insufficient documentation

## 2012-02-20 DIAGNOSIS — IMO0001 Reserved for inherently not codable concepts without codable children: Secondary | ICD-10-CM | POA: Insufficient documentation

## 2012-02-20 DIAGNOSIS — F3289 Other specified depressive episodes: Secondary | ICD-10-CM | POA: Insufficient documentation

## 2012-02-20 DIAGNOSIS — R51 Headache: Secondary | ICD-10-CM | POA: Insufficient documentation

## 2012-02-20 DIAGNOSIS — Z8509 Personal history of malignant neoplasm of other digestive organs: Secondary | ICD-10-CM | POA: Insufficient documentation

## 2012-02-20 DIAGNOSIS — Z8742 Personal history of other diseases of the female genital tract: Secondary | ICD-10-CM | POA: Insufficient documentation

## 2012-02-20 LAB — COMPREHENSIVE METABOLIC PANEL
AST: 77 U/L — ABNORMAL HIGH (ref 0–37)
BUN: 9 mg/dL (ref 6–23)
CO2: 22 mEq/L (ref 19–32)
Calcium: 8.8 mg/dL (ref 8.4–10.5)
Creatinine, Ser: 0.53 mg/dL (ref 0.50–1.10)
GFR calc Af Amer: >90 mL/min (ref >90)
GFR calc non Af Amer: >90 mL/min (ref >90)
Glucose, Bld: 105 mg/dL — ABNORMAL HIGH (ref 70–99)
Total Bilirubin: 0.3 mg/dL (ref 0.3–1.2)

## 2012-02-20 LAB — CBC WITH DIFFERENTIAL/PLATELET
Basophils Relative: 1 % (ref 0–1)
Hemoglobin: 13.6 g/dL (ref 12.0–15.0)
Lymphocytes Relative: 32 % (ref 12–46)
MCHC: 34.2 g/dL (ref 30.0–36.0)
Monocytes Relative: 5 % (ref 3–12)
Neutro Abs: 3.6 10*3/uL (ref 1.7–7.7)
Neutrophils Relative %: 57 % (ref 43–77)
RDW: 13.1 % (ref 11.5–15.5)
WBC: 6.3 10*3/uL (ref 4.0–10.5)

## 2012-02-20 LAB — URINALYSIS, ROUTINE W REFLEX MICROSCOPIC
Glucose, UA: NEGATIVE mg/dL
Ketones, ur: NEGATIVE mg/dL
Leukocytes, UA: NEGATIVE
Nitrite: NEGATIVE
Specific Gravity, Urine: 1.025 (ref 1.005–1.030)
pH: 5 (ref 5.0–8.0)

## 2012-02-20 LAB — LIPASE, BLOOD: Lipase: 28 U/L (ref 11–59)

## 2012-02-20 MED ORDER — PROMETHAZINE HCL 25 MG PO TABS: 25.0000 mg | ORAL_TABLET | Freq: Four times a day (QID) | ORAL | Status: DC | PRN

## 2012-02-20 MED ORDER — HYDROMORPHONE HCL PF 1 MG/ML IJ SOLN
1.0000 mg | Freq: Once | INTRAMUSCULAR | Status: AC
  Administered 2012-02-20: 1 mg via INTRAVENOUS
  Filled 2012-02-20: qty 1

## 2012-02-20 MED ORDER — ONDANSETRON HCL 4 MG/2ML IJ SOLN
4.0000 mg | Freq: Once | INTRAMUSCULAR | Status: AC
  Administered 2012-02-20: 4 mg via INTRAVENOUS
  Filled 2012-02-20: qty 2

## 2012-02-20 NOTE — ED Provider Notes (Signed)
History     CSN: [id number]  Arrival date & time 02/20/12  4098   First MD Initiated Contact with Patient 02/20/12 [phone]      Chief Complaint  Patient presents with  . Emesis  . Generalized Body Aches    (Consider location/radiation/quality/duration/timing/severity/associated sxs/prior treatment) Patient is a 27 y.o. female presenting with vomiting. The history is provided by the patient.  Emesis  This is a recurrent problem. The current episode started 12 to 24 hours ago. The problem occurs more than 10 times per day. The problem has not changed since onset.The emesis has an appearance of stomach contents. There has been no fever. Associated symptoms include headaches and myalgias. Pertinent negatives include no abdominal pain, no cough, no diarrhea, no fever and no URI. Risk factors: none.    Past Medical History  Diagnosis Date  . Polycystic ovarian syndrome 07/01/2011    Patient report  . Anxiety   . Depression   . Cancer of abdominal wall   . Rhabdosarcoma     Past Surgical History  Procedure Date  . Cholecystectomy   . Varicose vein surgery   . Ovarian cyst excision     Family History  Problem Relation Age of Onset  . Coronary artery disease Maternal Grandmother   . Diabetes type II Maternal Grandmother   . Cancer Maternal Grandmother   . Hypertension Mother   . Hypertension Father     History  Substance Use Topics  . Smoking status: Never Smoker   . Smokeless tobacco: Not on file  . Alcohol Use: No    OB History    Grav Para Term Preterm Abortions TAB SAB Ect Mult Living                  Review of Systems  Constitutional: Negative for fever.  Respiratory: Negative for cough.   Gastrointestinal: Positive for vomiting. Negative for abdominal pain and diarrhea.  Musculoskeletal: Positive for myalgias.  Neurological: Positive for headaches.  All other systems reviewed and are negative.    Allergies  Morphine and related  Home Medications    Current Outpatient Rx  Name  Route  Sig  Dispense  Refill  . FAMOTIDINE 20 MG PO TABS   Oral   Take 1 tablet (20 mg total) by mouth 2 (two) times daily. For acid reflux   60 tablet   0   . GABAPENTIN 300 MG PO CAPS   Oral   Take 1 capsule (300 mg total) by mouth 3 (three) times daily. For anxiety/pain   90 capsule   0   . HYDROXYZINE HCL 50 MG PO TABS   Oral   Take 1 tablet (50 mg total) by mouth every 6 (six) hours as needed for anxiety (anxiety). For anxiety   120 tablet   0   . NAPROXEN 500 MG PO TABS   Oral   Take 1 tablet (500 mg total) by mouth 2 (two) times daily. For pain management   30 tablet   0   . NAPROXEN 500 MG PO TABS   Oral   Take 1 tablet (500 mg total) by mouth 2 (two) times daily.   30 tablet   0   . QUETIAPINE FUMARATE ER 300 MG PO TB24   Oral   Take 2 tablets (600 mg total) by mouth at bedtime. For mood control   60 tablet   0   . QUETIAPINE FUMARATE ER 50 MG PO TB24   Oral  Take 100 mg by mouth at bedtime. Take with the 300 mg         . SERTRALINE HCL 100 MG PO TABS   Oral   Take 1 tablet (100 mg total) by mouth every evening. For depression   30 tablet   0   . SERTRALINE HCL 25 MG PO TABS   Oral   Take 25 mg by mouth daily. Take with the 100mg            BP 155/104  Pulse 102  Temp 97.4 F (36.3 C) (Oral)  Resp 18  SpO2 99%  Physical Exam  Nursing note and vitals reviewed. Constitutional: She is oriented to person, place, and time. She appears well-developed and well-nourished. No distress.  HENT:  Head: Normocephalic and atraumatic.  Mouth/Throat: Oropharynx is clear and moist.  Eyes: Conjunctivae normal and EOM are normal. Pupils are equal, round, and reactive to light.  Neck: Normal range of motion. Neck supple. No spinous process tenderness and no muscular tenderness present.  Cardiovascular: Regular rhythm and intact distal pulses.  Tachycardia present.   No murmur heard. Pulmonary/Chest: Effort normal and  breath sounds normal. No respiratory distress. She has no wheezes. She has no rales.  Abdominal: Soft. She exhibits no distension. There is tenderness in the epigastric area. There is no rebound and no guarding.       Mild epigastric tenderness.  obese  Musculoskeletal: Normal range of motion. She exhibits no edema and no tenderness.       No calf tenderness, swelling or erythema  Lymphadenopathy:    She has no cervical adenopathy.  Neurological: She is alert and oriented to person, place, and time.       No photophobia  Skin: Skin is warm and dry. No rash noted. No erythema.  Psychiatric: She has a normal mood and affect. Her behavior is normal.       Flat affect    ED Course  Procedures (including critical care time)  Labs Reviewed  COMPREHENSIVE METABOLIC PANEL - Abnormal; Notable for the following:    Potassium 3.4 (*)     Glucose, Bld 105 (*)     AST 77 (*)     ALT 48 (*)     All other components within normal limits  CBC WITH DIFFERENTIAL  LIPASE, BLOOD  POCT PREGNANCY, URINE  URINALYSIS, ROUTINE W REFLEX MICROSCOPIC   Dg Abd 1 View  02/20/2012  *RADIOLOGY REPORT*  Clinical Data:  pain, vomiting  ABDOMEN - 1 VIEW  Comparison: None.  Findings: There is nonspecific nonobstructive bowel gas pattern. Stool noted in the right colon.  Stool and gas noted in transverse colon.  Post cholecystectomy surgical clips are noted.  IMPRESSION: Nonspecific nonobstructive bowel gas pattern.  Stool noted in proximal colon.   Original Report Authenticated By: Natasha Mead, M.D.    Ct Head Wo Contrast  02/20/2012  *RADIOLOGY REPORT*  Clinical Data: Vomiting, body aches, history of sarcoma  CT HEAD WITHOUT CONTRAST  Technique:  Contiguous axial images were obtained from the base of the skull through the vertex without contrast.  Comparison: Prior CT scan of the head 10/18/2004.  Findings: No acute intracranial hemorrhage, acute infarction, mass lesion, mass effect, hydrocephalus or midline shift.  The  globes are intact.  The gray-white interface is preserved.  No focal soft tissue abnormality.  Mastoid air cells and paranasal sinuses are well-aerated.  No focal calvarial abnormality.  IMPRESSION: Negative noncontrasted CT scan of the head.   Original  Report Authenticated By: Malachy Moan, M.D.      No diagnosis found.    MDM   Patient coming in today complaining of of vomiting and diffuse body aches for the last 24 hours. She denies any focal abdominal pain or diarrhea. No fevers but states she has had a mild headache since the vomiting started but denies any visual changes.  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 She is currently not getting chemotherapy or any other drug therapy for this. She states her last imaging study was within the last month and the tumor had not changed. Patient is mentating normally and has a normal neuro exam. She has no focal abdominal tenderness on exam.  Patient has been here multiple times for nausea and vomiting. She has no medication at home that she takes for nausea and vomiting. Will give patient IV fluids and Zofran. CBC, CMP, lipase, UA, UPT pending.  Differential diagnosis includes viral etiology, pregnancy, UTI, possible obstruction from the rhabdomyosarcoma however I have no CT evidence within the last year of any such mass existing in her abdomen however do not have the most recent CT results so a KUB will be done. Also could be metastases to the brain causing vomiting and headache given patient is not currently taking any therapy and when asked she states she's not to worry about it. 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 He states the patient was diagnosed when she was in [place] however there  has been no testing since.  11:05 AM Pt is now tolerating pos and pt has had no further vomiting.  Normal labs and will d/c home.   Gwyneth Sprout, MD 02/20/12 1105

## 2012-02-20 NOTE — ED Notes (Signed)
Pt unable to obtain urine specimen at this time 

## 2012-02-20 NOTE — ED Notes (Signed)
Pt reports generalized body aches and vomiting since yesterday. Denies abdominal pain. Reports approximately 5 episodes of vomiting. No diarrhea.

## 2012-02-20 NOTE — ED Notes (Signed)
Generalized body pains x 2 days. Rates pain 9/10. Reports vomiting 5 times in last 24 hours. Hx of stomach cancer, not undergoing treatment.

## 2012-02-20 NOTE — ED Notes (Signed)
Patient transported to X-ray and CT 

## 2012-02-20 NOTE — ED Notes (Signed)
Urine sample requested from patient.  Patient stated that she did not have to void at this time.

## 2012-02-26 ENCOUNTER — Encounter (HOSPITAL_COMMUNITY): Payer: Self-pay | Admitting: *Deleted

## 2012-02-26 ENCOUNTER — Emergency Department (HOSPITAL_COMMUNITY)
Admission: EM | Admit: 2012-02-26 | Discharge: 2012-02-26 | Disposition: A | Discharge status: Home / Self Care | Payer: Self-pay | Attending: Emergency Medicine | Admitting: Emergency Medicine

## 2012-02-26 DIAGNOSIS — R5381 Other malaise: Secondary | ICD-10-CM | POA: Insufficient documentation

## 2012-02-26 DIAGNOSIS — J020 Streptococcal pharyngitis: Secondary | ICD-10-CM | POA: Insufficient documentation

## 2012-02-26 DIAGNOSIS — E282 Polycystic ovarian syndrome: Secondary | ICD-10-CM | POA: Insufficient documentation

## 2012-02-26 DIAGNOSIS — F329 Major depressive disorder, single episode, unspecified: Secondary | ICD-10-CM | POA: Insufficient documentation

## 2012-02-26 DIAGNOSIS — I10 Essential (primary) hypertension: Secondary | ICD-10-CM | POA: Insufficient documentation

## 2012-02-26 DIAGNOSIS — F3289 Other specified depressive episodes: Secondary | ICD-10-CM | POA: Insufficient documentation

## 2012-02-26 DIAGNOSIS — Z8509 Personal history of malignant neoplasm of other digestive organs: Secondary | ICD-10-CM | POA: Insufficient documentation

## 2012-02-26 DIAGNOSIS — F411 Generalized anxiety disorder: Secondary | ICD-10-CM | POA: Insufficient documentation

## 2012-02-26 HISTORY — DX: Essential (primary) hypertension: I10

## 2012-02-26 LAB — CBC WITH DIFFERENTIAL/PLATELET
Basophils Absolute: 0 10*3/uL (ref 0.0–0.1)
Basophils Relative: 0 % (ref 0–1)
Eosinophils Absolute: 0.3 10*3/uL (ref 0.0–0.7)
Eosinophils Relative: 4 % (ref 0–5)
HCT: 39.7 % (ref 36.0–46.0)
Hemoglobin: 13.4 g/dL (ref 12.0–15.0)
Lymphocytes Relative: 25 % (ref 12–46)
Lymphs Abs: 1.4 10*3/uL (ref 0.7–4.0)
MCH: 28 pg (ref 26.0–34.0)
MCHC: 33.8 g/dL (ref 30.0–36.0)
MCV: 82.9 fL (ref 78.0–100.0)
Monocytes Absolute: 0.3 10*3/uL (ref 0.1–1.0)
Monocytes Relative: 6 % (ref 3–12)
Neutro Abs: 3.6 10*3/uL (ref 1.7–7.7)
Neutrophils Relative %: 64 % (ref 43–77)
Platelets: 185 10*3/uL (ref 150–400)
RBC: 4.79 MIL/uL (ref 3.87–5.11)
RDW: 13.3 % (ref 11.5–15.5)
WBC: 5.7 10*3/uL (ref 4.0–10.5)

## 2012-02-26 LAB — BASIC METABOLIC PANEL
BUN: 6 mg/dL (ref 6–23)
CO2: 24 mEq/L (ref 19–32)
Calcium: 8.9 mg/dL (ref 8.4–10.5)
Chloride: 104 mEq/L (ref 96–112)
Creatinine, Ser: 0.45 mg/dL — ABNORMAL LOW (ref 0.50–1.10)
GFR calc Af Amer: >90 mL/min (ref >90)
GFR calc non Af Amer: >90 mL/min (ref >90)
Glucose, Bld: 96 mg/dL (ref 70–99)
Potassium: 4.2 mEq/L (ref 3.5–5.1)
Sodium: 137 mEq/L (ref 135–145)

## 2012-02-26 LAB — RAPID STREP SCREEN (MED CTR MEBANE ONLY): Streptococcus, Group A Screen (Direct): POSITIVE — AB

## 2012-02-26 MED ORDER — ONDANSETRON HCL 4 MG/2ML IJ SOLN
4.0000 mg | Freq: Once | INTRAMUSCULAR | Status: AC
  Administered 2012-02-26: 4 mg via INTRAVENOUS
  Filled 2012-02-26: qty 2

## 2012-02-26 MED ORDER — PENICILLIN G BENZATHINE 1200000 UNIT/2ML IM SUSP
1.2000 10*6.[IU] | Freq: Once | INTRAMUSCULAR | Status: AC
  Administered 2012-02-26: 1.2 10*6.[IU] via INTRAMUSCULAR
  Filled 2012-02-26 (×3): qty 2

## 2012-02-26 MED ORDER — FENTANYL CITRATE 0.05 MG/ML IJ SOLN
100.0000 ug | Freq: Once | INTRAMUSCULAR | Status: AC
  Administered 2012-02-26: 100 ug via INTRAVENOUS
  Filled 2012-02-26: qty 2

## 2012-02-26 MED ORDER — SODIUM CHLORIDE 0.9 % IV BOLUS (SEPSIS)
1000.0000 mL | Freq: Once | INTRAVENOUS | Status: AC
  Administered 2012-02-26: 1000 mL via INTRAVENOUS

## 2012-02-26 NOTE — ED Notes (Signed)
Waiting on pharmacy to send Bicillin. Med in pxyis is not correct dose.

## 2012-02-26 NOTE — ED Notes (Signed)
Patient states fatigue and weakness x 1 week, patient also states intermittant episodes of vomiting x 1 week

## 2012-02-26 NOTE — ED Provider Notes (Signed)
History     CSN: [id number]  Arrival date & time 02/26/12  0865   First MD Initiated Contact with Patient 02/26/12 [phone]      Chief Complaint  Patient presents with  . Emesis  . Fatigue    (Consider location/radiation/quality/duration/timing/severity/associated sxs/prior treatment) HPI Patient presents to the emergency department with sore throat, and body aches.  Patient states that these began 1 week ago.  Patient, states she's also had some intermittent nausea and vomiting.  Patient denies chest pain, shortness of breath, weakness, headache, fever, abdominal pain, back pain, or dysuria.  Patient, states she did not take anything prior to arrival for her symptoms. Past Medical History  Diagnosis Date  . Polycystic ovarian syndrome 07/01/2011    Patient report  . Anxiety   . Depression   . Cancer of abdominal wall   . Rhabdosarcoma   . Hypertension     Past Surgical History  Procedure Date  . Cholecystectomy   . Varicose vein surgery   . Ovarian cyst excision     Family History  Problem Relation Age of Onset  . Coronary artery disease Maternal Grandmother   . Diabetes type II Maternal Grandmother   . Cancer Maternal Grandmother   . Hypertension Mother   . Hypertension Father     History  Substance Use Topics  . Smoking status: Never Smoker   . Smokeless tobacco: Not on file  . Alcohol Use: No    OB History    Grav Para Term Preterm Abortions TAB SAB Ect Mult Living                  Review of Systems All other systems negative except as documented in the HPI. All pertinent positives and negatives as reviewed in the HPI. Allergies  Morphine and related  Home Medications   Current Outpatient Rx  Name  Route  Sig  Dispense  Refill  . GABAPENTIN 300 MG PO CAPS   Oral   Take 300 mg by mouth 4 (four) times daily. For anxiety/pain         . HYDROXYZINE HCL 50 MG PO TABS   Oral   Take 1 tablet (50 mg total) by mouth every 6 (six) hours as needed for  anxiety (anxiety). For anxiety   120 tablet   0   . NAPROXEN 500 MG PO TABS   Oral   Take 1 tablet (500 mg total) by mouth 2 (two) times daily. For pain management   30 tablet   0   . PROMETHAZINE HCL 25 MG PO TABS   Oral   Take 1 tablet (25 mg total) by mouth every 6 (six) hours as needed for nausea.   30 tablet   0   . QUETIAPINE FUMARATE ER 300 MG PO TB24   Oral   Take 2 tablets (600 mg total) by mouth at bedtime. For mood control   60 tablet   0   . QUETIAPINE FUMARATE ER 50 MG PO TB24   Oral   Take 100 mg by mouth at bedtime. Take with the 300 mg         . SERTRALINE HCL 100 MG PO TABS   Oral   Take 150 mg by mouth every evening. For depression           BP 130/88  Temp 98.1 F (36.7 C) (Oral)  Resp 20  SpO2 98%  LMP 12/24/2011  Physical Exam  Nursing note and vitals reviewed. Constitutional:  She appears well-developed and well-nourished. No distress.  HENT:  Head: Normocephalic and atraumatic. No trismus in the jaw.  Mouth/Throat: Uvula is midline. No uvula swelling. Posterior oropharyngeal edema and posterior oropharyngeal erythema present. No tonsillar abscesses.  Eyes: Pupils are equal, round, and reactive to light.  Cardiovascular: Normal rate, regular rhythm and normal heart sounds.  Exam reveals no gallop and no friction rub.   No murmur heard. Pulmonary/Chest: Effort normal and breath sounds normal.  Abdominal: Soft. Bowel sounds are normal. She exhibits no distension. There is no tenderness.  Skin: Skin is warm and dry. No rash noted.    ED Course  Procedures (including critical care time)  Labs Reviewed  BASIC METABOLIC PANEL - Abnormal; Notable for the following:    Creatinine, Ser 0.45 (*)     All other components within normal limits  RAPID STREP SCREEN - Abnormal; Notable for the following:    Streptococcus, Group A Screen (Direct) POSITIVE (*)     All other components within normal limits  CBC WITH DIFFERENTIAL  URINALYSIS,  ROUTINE W REFLEX MICROSCOPIC   Patient be treated for strep pharyngitis.  She is a rest return here for any worsening in her condition and also advised her to follow up with her primary care Dr. for recheck.  MDM          Carlyle Dolly, PA-C 02/26/12 1210

## 2012-02-26 NOTE — ED Notes (Signed)
Assisted patient to the restroom. Patient unable to urinate at this time

## 2012-02-27 NOTE — ED Provider Notes (Signed)
History/physical exam/procedure(s) were performed by non-physician practitioner and as supervising physician I was immediately available for consultation/collaboration. I have reviewed all notes and am in agreement with care and plan.   Hilario Quarry, MD 02/27/12 [phone]

## 2012-03-04 ENCOUNTER — Encounter (HOSPITAL_COMMUNITY): Payer: Self-pay

## 2012-03-04 ENCOUNTER — Emergency Department (HOSPITAL_COMMUNITY)
Admission: EM | Admit: 2012-03-04 | Discharge: 2012-03-04 | Disposition: A | Discharge status: Home / Self Care | Payer: Self-pay | Attending: Emergency Medicine | Admitting: Emergency Medicine

## 2012-03-04 DIAGNOSIS — C494 Malignant neoplasm of connective and soft tissue of abdomen: Secondary | ICD-10-CM | POA: Insufficient documentation

## 2012-03-04 DIAGNOSIS — I10 Essential (primary) hypertension: Secondary | ICD-10-CM | POA: Insufficient documentation

## 2012-03-04 DIAGNOSIS — R112 Nausea with vomiting, unspecified: Secondary | ICD-10-CM | POA: Insufficient documentation

## 2012-03-04 DIAGNOSIS — F411 Generalized anxiety disorder: Secondary | ICD-10-CM | POA: Insufficient documentation

## 2012-03-04 DIAGNOSIS — Z79899 Other long term (current) drug therapy: Secondary | ICD-10-CM | POA: Insufficient documentation

## 2012-03-04 DIAGNOSIS — J029 Acute pharyngitis, unspecified: Secondary | ICD-10-CM | POA: Insufficient documentation

## 2012-03-04 DIAGNOSIS — Z9089 Acquired absence of other organs: Secondary | ICD-10-CM | POA: Insufficient documentation

## 2012-03-04 DIAGNOSIS — F319 Bipolar disorder, unspecified: Secondary | ICD-10-CM | POA: Insufficient documentation

## 2012-03-04 DIAGNOSIS — Z8639 Personal history of other endocrine, nutritional and metabolic disease: Secondary | ICD-10-CM | POA: Insufficient documentation

## 2012-03-04 DIAGNOSIS — R1013 Epigastric pain: Secondary | ICD-10-CM | POA: Insufficient documentation

## 2012-03-04 DIAGNOSIS — T50995A Adverse effect of other drugs, medicaments and biological substances, initial encounter: Secondary | ICD-10-CM | POA: Insufficient documentation

## 2012-03-04 DIAGNOSIS — T451X5A Adverse effect of antineoplastic and immunosuppressive drugs, initial encounter: Secondary | ICD-10-CM

## 2012-03-04 DIAGNOSIS — Z862 Personal history of diseases of the blood and blood-forming organs and certain disorders involving the immune mechanism: Secondary | ICD-10-CM | POA: Insufficient documentation

## 2012-03-04 DIAGNOSIS — R197 Diarrhea, unspecified: Secondary | ICD-10-CM | POA: Insufficient documentation

## 2012-03-04 MED ORDER — KETOROLAC TROMETHAMINE 30 MG/ML IJ SOLN
30.0000 mg | Freq: Once | INTRAMUSCULAR | Status: AC
  Administered 2012-03-04: 30 mg via INTRAMUSCULAR
  Filled 2012-03-04: qty 1

## 2012-03-04 MED ORDER — FENTANYL CITRATE 0.05 MG/ML IJ SOLN
50.0000 ug | Freq: Once | INTRAMUSCULAR | Status: AC
  Administered 2012-03-04: 50 ug via INTRAMUSCULAR
  Filled 2012-03-04: qty 2

## 2012-03-04 MED ORDER — ONDANSETRON 8 MG PO TBDP: 8.0000 mg | ORAL_TABLET | Freq: Two times a day (BID) | ORAL | Status: DC | PRN

## 2012-03-04 MED ORDER — PROMETHAZINE HCL 25 MG RE SUPP: 25.0000 mg | Freq: Four times a day (QID) | RECTAL | Status: DC | PRN

## 2012-03-04 MED ORDER — ONDANSETRON 8 MG PO TBDP
8.0000 mg | ORAL_TABLET | Freq: Once | ORAL | Status: AC
  Administered 2012-03-04: 8 mg via ORAL
  Filled 2012-03-04: qty 1

## 2012-03-04 NOTE — ED Notes (Signed)
Patient states she was seen a week ago with strep throat. Today, the patient c/o sore throat, generalized body aches, N/V/D. Patient states she has had 8 episodes of diarrhea and vomiting in the past 24 hours.

## 2012-03-04 NOTE — Discharge Instructions (Signed)
Nausea and Vomiting Nausea is a sick feeling that often comes before throwing up (vomiting). Vomiting is a reflex where stomach contents come out of your mouth. Vomiting can cause severe loss of body fluids (dehydration). Children and elderly adults can become dehydrated quickly, especially if they also have diarrhea. Nausea and vomiting are symptoms of a condition or disease. It is important to find the cause of your symptoms. CAUSES   Direct irritation of the stomach lining. This irritation can result from increased acid production (gastroesophageal reflux disease), infection, food poisoning, taking certain medicines (such as nonsteroidal anti-inflammatory drugs), alcohol use, or tobacco use.  Signals from the brain.These signals could be caused by a headache, heat exposure, an inner ear disturbance, increased pressure in the brain from injury, infection, a tumor, or a concussion, pain, emotional stimulus, or metabolic problems.  An obstruction in the gastrointestinal tract (bowel obstruction).  Illnesses such as diabetes, hepatitis, gallbladder problems, appendicitis, kidney problems, cancer, sepsis, atypical symptoms of a heart attack, or eating disorders.  Medical treatments such as chemotherapy and radiation.  Receiving medicine that makes you sleep (general anesthetic) during surgery. DIAGNOSIS Your caregiver may ask for tests to be done if the problems do not improve after a few days. Tests may also be done if symptoms are severe or if the reason for the nausea and vomiting is not clear. Tests may include:  Urine tests.  Blood tests.  Stool tests.  Cultures (to look for evidence of infection).  X-rays or other imaging studies. Test results can help your caregiver make decisions about treatment or the need for additional tests. TREATMENT You need to stay well hydrated. Drink frequently but in small amounts.You may wish to drink water, sports drinks, clear broth, or eat frozen  ice pops or gelatin dessert to help stay hydrated.When you eat, eating slowly may help prevent nausea.There are also some antinausea medicines that may help prevent nausea. HOME CARE INSTRUCTIONS   Take all medicine as directed by your caregiver.  If you do not have an appetite, do not force yourself to eat. However, you must continue to drink fluids.  If you have an appetite, eat a normal diet unless your caregiver tells you differently.  Eat a variety of complex carbohydrates (rice, wheat, potatoes, bread), lean meats, yogurt, fruits, and vegetables.  Avoid high-fat foods because they are more difficult to digest.  Drink enough water and fluids to keep your urine clear or pale yellow.  If you are dehydrated, ask your caregiver for specific rehydration instructions. Signs of dehydration may include:  Severe thirst.  Dry lips and mouth.  Dizziness.  Dark urine.  Decreasing urine frequency and amount.  Confusion.  Rapid breathing or pulse. SEEK IMMEDIATE MEDICAL CARE IF:   You have blood or brown flecks (like coffee grounds) in your vomit.  You have black or bloody stools.  You have a severe headache or stiff neck.  You are confused.  You have severe abdominal pain.  You have chest pain or trouble breathing.  You do not urinate at least once every 8 hours.  You develop cold or clammy skin.  You continue to vomit for longer than 24 to 48 hours.  You have a fever. MAKE SURE YOU:   Understand these instructions.  Will watch your condition.  Will get help right away if you are not doing well or get worse. Document Released: 01/09/2005 Document Revised: 04/03/2011 Document Reviewed: 06/08/2010 [employer] Patient Information 2013 [employer].  

## 2012-03-04 NOTE — ED Provider Notes (Addendum)
History     CSN: [id number]  Arrival date & time 03/04/12  0811   First MD Initiated Contact with Patient 03/04/12 [phone]      Chief Complaint  Patient presents with  . Sore Throat  . Emesis  . Diarrhea    (Consider location/radiation/quality/duration/timing/severity/associated sxs/prior treatment) HPI Comments: 27 y/o with a history of bipolar disease, severe depression, and rhabdosarcoma presents to ED complaining of sore throat, nausea, vomiting, and diarrhea for several weeks.  Patient was seen in [facility] ED 02/26/12 and was treated for Strep Pharyngitis with Bicillin.  Patient was instructed to return to ED if she continued to have a sore throat.  Patient complains that she has had severe nausea and vomiting with up to nine episodes of vomiting for the last week.  Vomit appears to be stomach contents.  Diarrhea has also been occuring since last Monday.  Diarrhea is watery and yellow and patient complains of 8-10 episodes per day over the past week.  Patient has associated epigastric pain described as 9/10.  Patient has tried Pepto bismol, tylenol, and ibuprofen at home with no relief because she can't keep the medications down.  Patient is currently being treated with chemotherapy for her rhabdosarcoma at [facility] and gets her chemotherapy every Monday.  Patient denies sick contacts.   She was worried that she still had strep throat.  She told her cancer clinic about her receiving abx for strep.  Patient is a 27 y.o. female presenting with pharyngitis, vomiting, and diarrhea. The history is provided by the patient. No language interpreter was used.  Sore Throat Pertinent negatives include no chest pain and no shortness of breath.  Emesis Associated symptoms: diarrhea and sore throat   Associated symptoms: no chills   Diarrhea Associated symptoms: vomiting   Associated symptoms: no chills and no fever     Past Medical History  Diagnosis Date  . Polycystic ovarian syndrome 07/01/2011     Patient report  . Anxiety   . Depression   . Cancer of abdominal wall   . Rhabdosarcoma   . Hypertension     Past Surgical History  Procedure Laterality Date  . Cholecystectomy    . Varicose vein surgery    . Ovarian cyst excision      Family History  Problem Relation Age of Onset  . Coronary artery disease Maternal Grandmother   . Diabetes type II Maternal Grandmother   . Cancer Maternal Grandmother   . Hypertension Mother   . Hypertension Father     History  Substance Use Topics  . Smoking status: Never Smoker   . Smokeless tobacco: Never Used  . Alcohol Use: No    OB History   Grav Para Term Preterm Abortions TAB SAB Ect Mult Living                  Review of Systems  Constitutional: Negative for fever and chills.  HENT: Positive for sore throat. Negative for congestion, rhinorrhea and postnasal drip.   Respiratory: Negative for cough and shortness of breath.   Cardiovascular: Negative for chest pain.  Gastrointestinal: Positive for nausea, vomiting and diarrhea. Negative for constipation and blood in stool.  All other systems reviewed and are negative.    Allergies  Morphine and related  Home Medications   Current Outpatient Rx  Name  Route  Sig  Dispense  Refill  . acetaminophen (TYLENOL) 500 MG tablet   Oral   Take 1,000 mg by mouth every 6 (  six) hours as needed for pain.         Marland Kitchen gabapentin (NEURONTIN) 300 MG capsule   Oral   Take 300 mg by mouth 4 (four) times daily. For anxiety/pain         . hydrOXYzine (ATARAX/VISTARIL) 50 MG tablet   Oral   Take 50 mg by mouth every 6 (six) hours as needed for anxiety.         Marland Kitchen ibuprofen (ADVIL,MOTRIN) 200 MG tablet   Oral   Take 800 mg by mouth every 8 (eight) hours as needed for pain.         . naproxen (NAPROSYN) 500 MG tablet   Oral   Take 1 tablet (500 mg total) by mouth 2 (two) times daily. For pain management   30 tablet   0   . promethazine (PHENERGAN) 25 MG tablet   Oral    Take 1 tablet (25 mg total) by mouth every 6 (six) hours as needed for nausea.   30 tablet   0   . QUEtiapine (SEROQUEL XR) 300 MG 24 hr tablet   Oral   Take 600 mg by mouth at bedtime. Taken with 2 50mg  tablets to equal 700mg  qhs.         . QUEtiapine (SEROQUEL XR) 50 MG TB24   Oral   Take 100 mg by mouth at bedtime. Taken with 2 300mg  tablets to equal 700mg  qhs.         . sertraline (ZOLOFT) 100 MG tablet   Oral   Take 150 mg by mouth every evening. For depression         . ondansetron (ZOFRAN-ODT) 8 MG disintegrating tablet   Oral   Take 1 tablet (8 mg total) by mouth every 12 (twelve) hours as needed for nausea.   20 tablet   0   . promethazine (PHENERGAN) 25 MG suppository   Rectal   Place 1 suppository (25 mg total) rectally every 6 (six) hours as needed for nausea.   12 each   0     BP 130/92  Pulse 93  Temp(Src) 97.5 F (36.4 C) (Oral)  Resp 16  SpO2 98%  LMP 12/24/2011  Physical Exam  Nursing note and vitals reviewed. Constitutional: She is oriented to person, place, and time. She appears well-developed and well-nourished. No distress.  HENT:  Head: Normocephalic and atraumatic.  Right Ear: External ear normal.  Left Ear: External ear normal.  Mouth/Throat: Uvula is midline and oropharynx is clear and moist. Mucous membranes are not pale. No oropharyngeal exudate.  Minimally dry mucous membranes  Eyes: Conjunctivae are normal. Pupils are equal, round, and reactive to light.  Neck: Normal range of motion. Neck supple.  Cardiovascular: Normal rate, regular rhythm and normal heart sounds.   Pulmonary/Chest: Effort normal and breath sounds normal. No respiratory distress. She has no wheezes. She has no rales. She exhibits no tenderness.  Abdominal: Soft. Bowel sounds are normal. She exhibits no distension and no mass. There is generalized tenderness. There is no rebound and no guarding.  Musculoskeletal: Normal range of motion.  Lymphadenopathy:     She has no cervical adenopathy.  Neurological: She is alert and oriented to person, place, and time.  Skin: Skin is warm and dry. She is not diaphoretic.    ED Course  Procedures (including critical care time)  Labs Reviewed - No data to display No results found.   1. Chemotherapy induced nausea and vomiting     ra  sat is 98% and i interpret to be normal  MDM   Pt with slightly dry MM, but oropharynx is clear, no erythema, exudates.  Pt doesn't appear very dehydrated, no abd pain or tenderness on exam.  Pt I think has chronic pain associated with cancer and likely has malaise related to chronic N/V due to chemo.  I have encouraged her to discuss with her cancer provider that the phenergan tablets she is taking is not working.  Will give her symptom treatment in the ED now and she is agreeable with my suggestions.         Gavin Pound. Corin Tilly, MD 03/04/12 1000  Gavin Pound. Willy Vorce, MD 03/04/12 1006

## 2012-03-09 ENCOUNTER — Other Ambulatory Visit: Payer: Self-pay

## 2012-03-18 ENCOUNTER — Emergency Department (HOSPITAL_COMMUNITY): Payer: Self-pay

## 2012-03-18 ENCOUNTER — Emergency Department (HOSPITAL_COMMUNITY)
Admission: EM | Admit: 2012-03-18 | Discharge: 2012-03-18 | Disposition: A | Discharge status: Home / Self Care | Payer: Self-pay | Attending: Emergency Medicine | Admitting: Emergency Medicine

## 2012-03-18 ENCOUNTER — Encounter (HOSPITAL_COMMUNITY): Payer: Self-pay | Admitting: Emergency Medicine

## 2012-03-18 DIAGNOSIS — F411 Generalized anxiety disorder: Secondary | ICD-10-CM | POA: Insufficient documentation

## 2012-03-18 DIAGNOSIS — R0600 Dyspnea, unspecified: Secondary | ICD-10-CM

## 2012-03-18 DIAGNOSIS — Z8509 Personal history of malignant neoplasm of other digestive organs: Secondary | ICD-10-CM | POA: Insufficient documentation

## 2012-03-18 DIAGNOSIS — F3289 Other specified depressive episodes: Secondary | ICD-10-CM | POA: Insufficient documentation

## 2012-03-18 DIAGNOSIS — S8990XA Unspecified injury of unspecified lower leg, initial encounter: Secondary | ICD-10-CM | POA: Insufficient documentation

## 2012-03-18 DIAGNOSIS — M79609 Pain in unspecified limb: Secondary | ICD-10-CM

## 2012-03-18 DIAGNOSIS — Y92410 Unspecified street and highway as the place of occurrence of the external cause: Secondary | ICD-10-CM | POA: Insufficient documentation

## 2012-03-18 DIAGNOSIS — R0989 Other specified symptoms and signs involving the circulatory and respiratory systems: Secondary | ICD-10-CM | POA: Insufficient documentation

## 2012-03-18 DIAGNOSIS — R51 Headache: Secondary | ICD-10-CM | POA: Insufficient documentation

## 2012-03-18 DIAGNOSIS — Z79899 Other long term (current) drug therapy: Secondary | ICD-10-CM | POA: Insufficient documentation

## 2012-03-18 DIAGNOSIS — Z8742 Personal history of other diseases of the female genital tract: Secondary | ICD-10-CM | POA: Insufficient documentation

## 2012-03-18 DIAGNOSIS — I10 Essential (primary) hypertension: Secondary | ICD-10-CM | POA: Insufficient documentation

## 2012-03-18 DIAGNOSIS — Z8589 Personal history of malignant neoplasm of other organs and systems: Secondary | ICD-10-CM | POA: Insufficient documentation

## 2012-03-18 DIAGNOSIS — Y9389 Activity, other specified: Secondary | ICD-10-CM | POA: Insufficient documentation

## 2012-03-18 DIAGNOSIS — F329 Major depressive disorder, single episode, unspecified: Secondary | ICD-10-CM | POA: Insufficient documentation

## 2012-03-18 DIAGNOSIS — R0609 Other forms of dyspnea: Secondary | ICD-10-CM | POA: Insufficient documentation

## 2012-03-18 HISTORY — DX: Type 2 diabetes mellitus without complications: E11.9

## 2012-03-18 LAB — BASIC METABOLIC PANEL
CO2: 22 mEq/L (ref 19–32)
Calcium: 9.3 mg/dL (ref 8.4–10.5)
Chloride: 104 mEq/L (ref 96–112)
GFR calc non Af Amer: >90 mL/min (ref >90)
Sodium: 138 mEq/L (ref 135–145)

## 2012-03-18 LAB — CBC WITH DIFFERENTIAL/PLATELET
Basophils Absolute: 0 10*3/uL (ref 0.0–0.1)
Basophils Relative: 0 % (ref 0–1)
Lymphocytes Relative: 32 % (ref 12–46)
MCHC: 34 g/dL (ref 30.0–36.0)
Neutro Abs: 2.8 10*3/uL (ref 1.7–7.7)
Platelets: 216 10*3/uL (ref 150–400)
RDW: 13.4 % (ref 11.5–15.5)
WBC: 4.9 10*3/uL (ref 4.0–10.5)

## 2012-03-18 LAB — PROTIME-INR: INR: 1 (ref 0.00–1.49)

## 2012-03-18 MED ORDER — IOHEXOL 350 MG/ML SOLN
100.0000 mL | Freq: Once | INTRAVENOUS | Status: AC | PRN
  Administered 2012-03-18: 100 mL via INTRAVENOUS

## 2012-03-18 MED ORDER — NAPROXEN 500 MG PO TABS: 500.0000 mg | ORAL_TABLET | Freq: Two times a day (BID) | ORAL | Status: DC

## 2012-03-18 MED ORDER — SODIUM CHLORIDE 0.9 % IV SOLN
Freq: Once | INTRAVENOUS | Status: AC
  Administered 2012-03-18: 09:00:00 via INTRAVENOUS

## 2012-03-18 MED ORDER — HYDROMORPHONE HCL PF 1 MG/ML IJ SOLN
0.5000 mg | Freq: Once | INTRAMUSCULAR | Status: AC
  Administered 2012-03-18: 0.5 mg via INTRAVENOUS
  Filled 2012-03-18: qty 1
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
## 2012-03-18 NOTE — ED Notes (Signed)
Pt returned from CT °

## 2012-03-18 NOTE — Progress Notes (Signed)
Patient ID: Tina Saunders, female   DOB: 1985/08/10, 27 y.o.   MRN: [id number]   Called to CT room to evaluate contrast extravasation Approx 30 cc in to rt antecubital space  +redness in antecubital crease +swelling 3cm around site Fluctuant Sl tender  Skin intact no blisters Good refill  Rt hand FROM 2+ pulses  Will check in am if inpt Elevate and ice for now Call if nay signs of infection; sloughing skin; Worsening pain/redness  Pt has good understanding of this plan

## 2012-03-18 NOTE — ED Provider Notes (Signed)
Medical screening examination/treatment/procedure(s) were performed by non-physician practitioner and as supervising physician I was immediately available for consultation/collaboration. Devoria Albe, MD, Armando Gang   Ward Givens, MD 03/18/12 [phone]

## 2012-03-18 NOTE — ED Notes (Signed)
Ambulated pt per Melvenia Beam, [place]; pt walked from room around nurses' station and back to her room without any difficulty or distress; pt stating between 95% - 97%RA with a heart rate between 120 - 127bpm

## 2012-03-18 NOTE — ED Notes (Signed)
Discharge instructions reviewed. Pt verbalized understanding.  

## 2012-03-18 NOTE — Progress Notes (Signed)
*  PRELIMINARY RESULTS* Vascular Ultrasound Right lower extremity venous duplex has been completed.  Preliminary findings: Right:  No evidence of DVT, superficial thrombosis, or Baker's cyst.   Farrel Demark, RDMS, RVT  03/18/2012, 10:00 AM

## 2012-03-18 NOTE — ED Notes (Signed)
Pt transported to CT ?
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
## 2012-03-25 ENCOUNTER — Emergency Department (HOSPITAL_COMMUNITY)
Admission: EM | Admit: 2012-03-25 | Discharge: 2012-03-25 | Disposition: A | Discharge status: Home / Self Care | Payer: Self-pay | Attending: Emergency Medicine | Admitting: Emergency Medicine

## 2012-03-25 ENCOUNTER — Encounter (HOSPITAL_COMMUNITY): Payer: Self-pay | Admitting: *Deleted

## 2012-03-25 ENCOUNTER — Emergency Department (HOSPITAL_COMMUNITY): Payer: Self-pay

## 2012-03-25 DIAGNOSIS — Z8509 Personal history of malignant neoplasm of other digestive organs: Secondary | ICD-10-CM | POA: Insufficient documentation

## 2012-03-25 DIAGNOSIS — G8929 Other chronic pain: Secondary | ICD-10-CM | POA: Insufficient documentation

## 2012-03-25 DIAGNOSIS — I10 Essential (primary) hypertension: Secondary | ICD-10-CM | POA: Insufficient documentation

## 2012-03-25 DIAGNOSIS — S82831A Other fracture of upper and lower end of right fibula, initial encounter for closed fracture: Secondary | ICD-10-CM

## 2012-03-25 DIAGNOSIS — S82899A Other fracture of unspecified lower leg, initial encounter for closed fracture: Secondary | ICD-10-CM | POA: Insufficient documentation

## 2012-03-25 DIAGNOSIS — Y939 Activity, unspecified: Secondary | ICD-10-CM | POA: Insufficient documentation

## 2012-03-25 DIAGNOSIS — Z8742 Personal history of other diseases of the female genital tract: Secondary | ICD-10-CM | POA: Insufficient documentation

## 2012-03-25 DIAGNOSIS — R109 Unspecified abdominal pain: Secondary | ICD-10-CM | POA: Insufficient documentation

## 2012-03-25 DIAGNOSIS — F3289 Other specified depressive episodes: Secondary | ICD-10-CM | POA: Insufficient documentation

## 2012-03-25 DIAGNOSIS — F411 Generalized anxiety disorder: Secondary | ICD-10-CM | POA: Insufficient documentation

## 2012-03-25 DIAGNOSIS — Z79899 Other long term (current) drug therapy: Secondary | ICD-10-CM | POA: Insufficient documentation

## 2012-03-25 DIAGNOSIS — Y92410 Unspecified street and highway as the place of occurrence of the external cause: Secondary | ICD-10-CM | POA: Insufficient documentation

## 2012-03-25 DIAGNOSIS — E119 Type 2 diabetes mellitus without complications: Secondary | ICD-10-CM | POA: Insufficient documentation

## 2012-03-25 LAB — COMPREHENSIVE METABOLIC PANEL
ALT: 37 U/L — ABNORMAL HIGH (ref 0–35)
AST: 50 U/L — ABNORMAL HIGH (ref 0–37)
Albumin: 3.3 g/dL — ABNORMAL LOW (ref 3.5–5.2)
Alkaline Phosphatase: 100 U/L (ref 39–117)
BUN: 8 mg/dL (ref 6–23)
CO2: 22 mEq/L (ref 19–32)
Calcium: 8.8 mg/dL (ref 8.4–10.5)
Chloride: 103 mEq/L (ref 96–112)
Creatinine, Ser: 0.42 mg/dL — ABNORMAL LOW (ref 0.50–1.10)
GFR calc Af Amer: >90 mL/min (ref >90)
GFR calc non Af Amer: >90 mL/min (ref >90)
Glucose, Bld: 116 mg/dL — ABNORMAL HIGH (ref 70–99)
Potassium: 3.8 mEq/L (ref 3.5–5.1)
Sodium: 137 mEq/L (ref 135–145)
Total Bilirubin: 0.2 mg/dL — ABNORMAL LOW (ref 0.3–1.2)
Total Protein: 6.6 g/dL (ref 6.0–8.3)

## 2012-03-25 LAB — CBC
HCT: 37.1 % (ref 36.0–46.0)
Hemoglobin: 12.5 g/dL (ref 12.0–15.0)
MCH: 28 pg (ref 26.0–34.0)
MCHC: 33.7 g/dL (ref 30.0–36.0)
MCV: 83 fL (ref 78.0–100.0)
Platelets: 197 10*3/uL (ref 150–400)
RBC: 4.47 MIL/uL (ref 3.87–5.11)
RDW: 13.6 % (ref 11.5–15.5)
WBC: 6.1 10*3/uL (ref 4.0–10.5)

## 2012-03-25 LAB — LIPASE, BLOOD: Lipase: 29 U/L (ref 11–59)

## 2012-03-25 MED ORDER — ONDANSETRON HCL 4 MG/2ML IJ SOLN
4.0000 mg | Freq: Once | INTRAMUSCULAR | Status: AC
  Administered 2012-03-25: 4 mg via INTRAVENOUS
  Filled 2012-03-25: qty 2

## 2012-03-25 MED ORDER — FENTANYL CITRATE 0.05 MG/ML IJ SOLN
100.0000 ug | Freq: Once | INTRAMUSCULAR | Status: AC
  Administered 2012-03-25: 100 ug via INTRAVENOUS
  Filled 2012-03-25: qty 2

## 2012-03-25 MED ORDER — TRAMADOL HCL 50 MG PO TABS: 50.0000 mg | ORAL_TABLET | Freq: Four times a day (QID) | ORAL | Status: DC | PRN

## 2012-03-25 MED ORDER — SODIUM CHLORIDE 0.9 % IV BOLUS (SEPSIS)
1000.0000 mL | Freq: Once | INTRAVENOUS | Status: AC
  Administered 2012-03-25: 1000 mL via INTRAVENOUS
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
## 2012-03-25 NOTE — Progress Notes (Signed)
Orthopedic Tech Progress Note Patient Details:  Tina Saunders Aug 05, 1985 [id number]  Ortho Devices Type of Ortho Device: Crutches Ortho Device/Splint Interventions: Adjustment   Cammer, Mickie Bail 03/25/2012, 11:58 AM

## 2012-03-25 NOTE — Progress Notes (Signed)
Orthopedic Tech Progress Note Patient Details:  Tina Saunders 25-Aug-1985 [id number]  Patient ID: Tina Saunders, female   DOB: December 24, 1985, 27 y.o.   MRN: [id number]   Tina Saunders 03/25/2012, 12:17 PM PATIENT REFUSED CRUTCHES.

## 2012-03-28 NOTE — ED Provider Notes (Signed)
Medical screening examination/treatment/procedure(s) were performed by non-physician practitioner and as supervising physician I was immediately available for consultation/collaboration.   Corian Handley H Eleaner Dibartolo, MD 03/28/12 0900 

## 2012-04-06 ENCOUNTER — Emergency Department (HOSPITAL_COMMUNITY)
Admission: EM | Admit: 2012-04-06 | Discharge: 2012-04-07 | Disposition: A | Discharge status: Home / Self Care | Payer: Self-pay | Attending: Emergency Medicine | Admitting: Emergency Medicine

## 2012-04-06 ENCOUNTER — Encounter (HOSPITAL_COMMUNITY): Payer: Self-pay | Admitting: Emergency Medicine

## 2012-04-06 DIAGNOSIS — I10 Essential (primary) hypertension: Secondary | ICD-10-CM | POA: Insufficient documentation

## 2012-04-06 DIAGNOSIS — R1013 Epigastric pain: Secondary | ICD-10-CM | POA: Insufficient documentation

## 2012-04-06 DIAGNOSIS — E119 Type 2 diabetes mellitus without complications: Secondary | ICD-10-CM | POA: Insufficient documentation

## 2012-04-06 DIAGNOSIS — M79609 Pain in unspecified limb: Secondary | ICD-10-CM | POA: Insufficient documentation

## 2012-04-06 DIAGNOSIS — F3289 Other specified depressive episodes: Secondary | ICD-10-CM | POA: Insufficient documentation

## 2012-04-06 DIAGNOSIS — F411 Generalized anxiety disorder: Secondary | ICD-10-CM | POA: Insufficient documentation

## 2012-04-06 DIAGNOSIS — Z3202 Encounter for pregnancy test, result negative: Secondary | ICD-10-CM | POA: Insufficient documentation

## 2012-04-06 DIAGNOSIS — Z8639 Personal history of other endocrine, nutritional and metabolic disease: Secondary | ICD-10-CM | POA: Insufficient documentation

## 2012-04-06 DIAGNOSIS — Z8589 Personal history of malignant neoplasm of other organs and systems: Secondary | ICD-10-CM | POA: Insufficient documentation

## 2012-04-06 DIAGNOSIS — R110 Nausea: Secondary | ICD-10-CM | POA: Insufficient documentation

## 2012-04-06 DIAGNOSIS — Z862 Personal history of diseases of the blood and blood-forming organs and certain disorders involving the immune mechanism: Secondary | ICD-10-CM | POA: Insufficient documentation

## 2012-04-06 DIAGNOSIS — Z79899 Other long term (current) drug therapy: Secondary | ICD-10-CM | POA: Insufficient documentation

## 2012-04-06 LAB — COMPREHENSIVE METABOLIC PANEL
ALT: 34 U/L (ref 0–35)
AST: 53 U/L — ABNORMAL HIGH (ref 0–37)
Albumin: 3.8 g/dL (ref 3.5–5.2)
CO2: 23 mEq/L (ref 19–32)
Calcium: 9.7 mg/dL (ref 8.4–10.5)
Creatinine, Ser: 0.46 mg/dL — ABNORMAL LOW (ref 0.50–1.10)
GFR calc non Af Amer: >90 mL/min (ref >90)
Sodium: 139 mEq/L (ref 135–145)
Total Protein: 7.7 g/dL (ref 6.0–8.3)

## 2012-04-06 LAB — CBC WITH DIFFERENTIAL/PLATELET
Basophils Absolute: 0 10*3/uL (ref 0.0–0.1)
Basophils Relative: 0 % (ref 0–1)
Eosinophils Absolute: 0.7 10*3/uL (ref 0.0–0.7)
Eosinophils Relative: 7 % — ABNORMAL HIGH (ref 0–5)
HCT: 39.4 % (ref 36.0–46.0)
MCHC: 34.8 g/dL (ref 30.0–36.0)
MCV: 81.7 fL (ref 78.0–100.0)
Monocytes Absolute: 0.6 10*3/uL (ref 0.1–1.0)
Platelets: 256 10*3/uL (ref 150–400)
RDW: 13.9 % (ref 11.5–15.5)
WBC: 10.5 10*3/uL (ref 4.0–10.5)

## 2012-04-06 LAB — PREGNANCY, URINE: Preg Test, Ur: NEGATIVE

## 2012-04-06 MED ORDER — SODIUM CHLORIDE 0.9 % IV SOLN
1000.0000 mL | INTRAVENOUS | Status: DC
  Administered 2012-04-07: 1000 mL via INTRAVENOUS

## 2012-04-06 MED ORDER — ONDANSETRON HCL 4 MG/2ML IJ SOLN
4.0000 mg | Freq: Once | INTRAMUSCULAR | Status: AC
  Administered 2012-04-07: 4 mg via INTRAVENOUS
  Filled 2012-04-06: qty 2

## 2012-04-06 MED ORDER — HYDROMORPHONE HCL PF 1 MG/ML IJ SOLN
1.0000 mg | Freq: Once | INTRAMUSCULAR | Status: AC
  Administered 2012-04-07: 1 mg via INTRAVENOUS
  Filled 2012-04-06: qty 1

## 2012-04-06 MED ORDER — SODIUM CHLORIDE 0.9 % IV SOLN
1000.0000 mL | Freq: Once | INTRAVENOUS | Status: AC
  Administered 2012-04-07: 1000 mL via INTRAVENOUS

## 2012-04-06 NOTE — ED Provider Notes (Signed)
History     CSN: [id number]  Arrival date & time 04/06/12  2217   First MD Initiated Contact with Patient 04/06/12 2343      Chief Complaint  Patient presents with  . Abdominal Pain    (Consider location/radiation/quality/duration/timing/severity/associated sxs/prior treatment) Patient is a 27 y.o. female presenting with abdominal pain. The history is provided by the patient.  Abdominal Pain  she has been complaining of upper abdominal pain and nausea for the last week. Pain is severe and she rates it at 9/10. There is no radiation of pain. There is associated nausea but no vomiting. She denies constipation or diarrhea. Pain is worse when she sits up and worse when she eats, better when she lays flat. She denies fever, chills, sweats. Also, over the same time period, she has had pain in her left calf and. She denies any trauma to her legs. 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 She is supposed to be switched to a different chemotherapy agents. She states that her chemotherapy is given by a physician in [place]  Past Medical History  Diagnosis Date  . Polycystic ovarian syndrome 07/01/2011    Patient report  . Anxiety   . Depression   . Cancer of abdominal wall   . Rhabdosarcoma   . Hypertension   . Diabetes mellitus without complication     Past Surgical History  Procedure Laterality Date  . Cholecystectomy    . Varicose vein surgery    . Ovarian cyst excision      Family History  Problem Relation Age of Onset  . Coronary artery disease Maternal Grandmother   . Diabetes type II Maternal Grandmother   . Cancer Maternal Grandmother   . Hypertension Mother   . Hypertension Father     History  Substance Use Topics  . Smoking status: Never Smoker   . Smokeless tobacco: Never Used  . Alcohol Use: No    OB History   Grav Para Term Preterm Abortions TAB SAB Ect Mult  Living                  Review of Systems  Gastrointestinal: Positive for abdominal pain.  All other systems reviewed and are negative.    Allergies  Morphine and related  Home Medications   Current Outpatient Rx  Name  Route  Sig  Dispense  Refill  . gabapentin (NEURONTIN) 300 MG capsule   Oral   Take 300 mg by mouth 4 (four) times daily. For anxiety/pain         . hydrOXYzine (ATARAX/VISTARIL) 50 MG tablet   Oral   Take 50 mg by mouth every 6 (six) hours as needed for anxiety.         Marland Kitchen ibuprofen (ADVIL,MOTRIN) 200 MG tablet   Oral   Take 800 mg by mouth every 8 (eight) hours as needed for pain.         . naproxen (NAPROSYN) 500 MG tablet   Oral   Take 1 tablet (500 mg total) by mouth 2 (two) times daily. For pain management   20 tablet   0   . ondansetron (ZOFRAN-ODT) 8 MG disintegrating tablet   Oral   Take 8 mg by mouth every 12 (twelve) hours as needed for nausea.         Marland Kitchen QUEtiapine (SEROQUEL XR) 300 MG 24 hr tablet  Oral   Take 600 mg by mouth at bedtime. Taken with 2 50mg  tablets to equal 700mg  qhs.         . QUEtiapine (SEROQUEL XR) 50 MG TB24   Oral   Take 100 mg by mouth at bedtime. Taken with 2 300mg  tablets to equal 700mg  qhs.         . sertraline (ZOLOFT) 100 MG tablet   Oral   Take 150 mg by mouth every evening. For depression         . traMADol (ULTRAM) 50 MG tablet   Oral   Take 1 tablet (50 mg total) by mouth every 6 (six) hours as needed for pain.   15 tablet   0     BP 158/113  Pulse 96  Temp(Src) 97.7 F (36.5 C) (Oral)  Resp 15  SpO2 99%  LMP 03/20/2012  Physical Exam  Nursing note and vitals reviewed.  Morbidly obese 27 year old female, resting comfortably and in no acute distress. Vital signs are significant for hypertension with blood pressure 150/113. Oxygen saturation is 99%, which is normal. Head is normocephalic and atraumatic. PERRLA, EOMI. Oropharynx is clear. Neck is nontender and supple without  adenopathy or JVD. Back is nontender and there is no CVA tenderness. Lungs are clear without rales, wheezes, or rhonchi. Chest is nontender. Heart has regular rate and rhythm without murmur. Abdomen is soft, flat, with moderate tenderness in the epigastrium and the right and left upper quadrants. There is no rebound or guarding. There are no masses or hepatosplenomegaly and peristalsis is hypoactive. Extremities have no cyanosis or edema, full range of motion is present. There is moderate tenderness to palpation in the left calf. There is no difference in calf circumference between right and left. There is no erythema or warmth or cords. There is a negative Homans sign. Skin is warm and dry without rash. Neurologic: Mental status is normal, cranial nerves are intact, there are no motor or sensory deficits.  ED Course  Procedures (including critical care time)  Results for orders placed during the hospital encounter of 04/06/12  CBC WITH DIFFERENTIAL      Result Value Range   WBC 10.5  4.0 - 10.5 K/uL   RBC 4.82  3.87 - 5.11 MIL/uL   Hemoglobin 13.7  12.0 - 15.0 g/dL   HCT 16.1  09.6 - 04.5 %   MCV 81.7  78.0 - 100.0 fL   MCH 28.4  26.0 - 34.0 pg   MCHC 34.8  30.0 - 36.0 g/dL   RDW 40.9  81.1 - 91.4 %   Platelets 256  150 - 400 K/uL   Neutrophils Relative 66  43 - 77 %   Neutro Abs 6.9  1.7 - 7.7 K/uL   Lymphocytes Relative 22  12 - 46 %   Lymphs Abs 2.3  0.7 - 4.0 K/uL   Monocytes Relative 6  3 - 12 %   Monocytes Absolute 0.6  0.1 - 1.0 K/uL   Eosinophils Relative 7 (*) 0 - 5 %   Eosinophils Absolute 0.7  0.0 - 0.7 K/uL   Basophils Relative 0  0 - 1 %   Basophils Absolute 0.0  0.0 - 0.1 K/uL  COMPREHENSIVE METABOLIC PANEL      Result Value Range   Sodium 139  135 - 145 mEq/L   Potassium 3.7  3.5 - 5.1 mEq/L   Chloride 102  96 - 112 mEq/L   CO2 23  19 -  32 mEq/L   Glucose, Bld 124 (*) 70 - 99 mg/dL   BUN 9  6 - 23 mg/dL   Creatinine, Ser 5.78 (*) 0.50 - 1.10 mg/dL   Calcium  9.7  8.4 - 46.9 mg/dL   Total Protein 7.7  6.0 - 8.3 g/dL   Albumin 3.8  3.5 - 5.2 g/dL   AST 53 (*) 0 - 37 U/L   ALT 34  0 - 35 U/L   Alkaline Phosphatase 103  39 - 117 U/L   Total Bilirubin 0.2 (*) 0.3 - 1.2 mg/dL   GFR calc non Af Amer >90  >90 mL/min   GFR calc Af Amer >90  >90 mL/min  URINALYSIS, ROUTINE W REFLEX MICROSCOPIC      Result Value Range   Color, Urine YELLOW  YELLOW   APPearance CLOUDY (*) CLEAR   Specific Gravity, Urine 1.030  1.005 - 1.030   pH 6.0  5.0 - 8.0   Glucose, UA NEGATIVE  NEGATIVE mg/dL   Hgb urine dipstick NEGATIVE  NEGATIVE   Bilirubin Urine NEGATIVE  NEGATIVE   Ketones, ur NEGATIVE  NEGATIVE mg/dL   Protein, ur NEGATIVE  NEGATIVE mg/dL   Urobilinogen, UA 0.2  0.0 - 1.0 mg/dL   Nitrite NEGATIVE  NEGATIVE   Leukocytes, UA MODERATE (*) NEGATIVE  PREGNANCY, URINE      Result Value Range   Preg Test, Ur NEGATIVE  NEGATIVE  LIPASE, BLOOD      Result Value Range   Lipase 27  11 - 59 U/L  URINE MICROSCOPIC-ADD ON      Result Value Range   Squamous Epithelial / LPF MANY (*) RARE   WBC, UA 7-10  <3 WBC/hpf   RBC / HPF 0-2  <3 RBC/hpf   Bacteria, UA MANY (*) RARE     1. Epigastric pain   2. Nausea       MDM  Abdominal pain and nausea of uncertain cause. Pain in left calf without other physical findings. With history of sarcoma, she will need to be evaluated for DVT. Dopplers not available at this time so she is given a dose of enoxaparin. Screening labs have been obtained and she'll be given IV fluids and IV hydromorphone and ondansetron. Old records are reviewed and curiously, CT angiogram of her chest on February 24 did not show any evidence of cancer and a recent CT of her abdomen and pelvis showed no evidence of cancer.  She feels much better after above noted treatment. She was also given IV pantoprazole and a GI cocktail. Laboratory workup is unremarkable. She has mild elevation of AST which is unchanged from previous readings. Urinalysis is  contaminated specimen, and, without clinical evidence of UTI, she'll not be given any prescriptions for antibiotics. She is sent home with prescriptions for Percocet and ondansetron and told to take Prilosec OTC for the next 2 weeks. Follow up with PCP.  Dione Booze, MD 04/07/12 0200

## 2012-04-06 NOTE — ED Notes (Signed)
Pt c/o abd pain with nausea but has not vomited onset 1 week ago.  Pt also c/o pain in calve of left leg.

## 2012-04-07 LAB — URINALYSIS, ROUTINE W REFLEX MICROSCOPIC
Bilirubin Urine: NEGATIVE
Hgb urine dipstick: NEGATIVE
Specific Gravity, Urine: 1.03 (ref 1.005–1.030)
Urobilinogen, UA: 0.2 mg/dL (ref 0.0–1.0)
pH: 6 (ref 5.0–8.0)

## 2012-04-07 LAB — LIPASE, BLOOD: Lipase: 27 U/L (ref 11–59)

## 2012-04-07 LAB — URINE MICROSCOPIC-ADD ON

## 2012-04-07 MED ORDER — ONDANSETRON HCL 4 MG PO TABS: 4.0000 mg | ORAL_TABLET | Freq: Three times a day (TID) | ORAL | Status: DC | PRN

## 2012-04-07 MED ORDER — PANTOPRAZOLE SODIUM 40 MG IV SOLR
40.0000 mg | Freq: Once | INTRAVENOUS | Status: AC
  Administered 2012-04-07: 40 mg via INTRAVENOUS
  Filled 2012-04-07: qty 40

## 2012-04-07 MED ORDER — GI COCKTAIL ~~LOC~~
30.0000 mL | Freq: Once | ORAL | Status: AC
  Administered 2012-04-07: 30 mL via ORAL
  Filled 2012-04-07: qty 30

## 2012-04-07 MED ORDER — ENOXAPARIN SODIUM 120 MG/0.8ML ~~LOC~~ SOLN
115.0000 mg | Freq: Once | SUBCUTANEOUS | Status: AC
  Administered 2012-04-07: 115 mg via SUBCUTANEOUS
  Filled 2012-04-07: qty 0.8

## 2012-04-07 MED ORDER — OXYCODONE-ACETAMINOPHEN 5-325 MG PO TABS: 1.0000 | ORAL_TABLET | ORAL | Status: DC | PRN

## 2012-04-07 NOTE — ED Notes (Signed)
Pt ambulating independently w/ steady gait on d/c in no acute distress, A&Ox4. D/c instructions reviewed w/ pt and family - pt and family deny any further questions or concerns at present. Rx given x2  

## 2012-04-07 NOTE — ED Notes (Signed)
Pt reports epigastric pain and nausea x1 week - pt states she has managed not to throw up however experiences constant nausea. Pt currently undergoing chemotherapy as well. Pt in no acute distress, pleasant on assessment. Pt also reports left calf pain x1 week and hx of blood clots. Extremity is pink, warm and dry - does not appear edematous on assessment. CMS intact.

## 2012-04-08 LAB — URINE CULTURE: Colony Count: >=100000

## 2012-04-11 ENCOUNTER — Encounter (HOSPITAL_COMMUNITY): Payer: Self-pay | Admitting: Emergency Medicine

## 2012-04-11 DIAGNOSIS — F411 Generalized anxiety disorder: Secondary | ICD-10-CM | POA: Insufficient documentation

## 2012-04-11 DIAGNOSIS — N83209 Unspecified ovarian cyst, unspecified side: Secondary | ICD-10-CM | POA: Insufficient documentation

## 2012-04-11 DIAGNOSIS — F3289 Other specified depressive episodes: Secondary | ICD-10-CM | POA: Insufficient documentation

## 2012-04-11 DIAGNOSIS — Z9089 Acquired absence of other organs: Secondary | ICD-10-CM | POA: Insufficient documentation

## 2012-04-11 DIAGNOSIS — Z8742 Personal history of other diseases of the female genital tract: Secondary | ICD-10-CM | POA: Insufficient documentation

## 2012-04-11 DIAGNOSIS — I10 Essential (primary) hypertension: Secondary | ICD-10-CM | POA: Insufficient documentation

## 2012-04-11 DIAGNOSIS — E119 Type 2 diabetes mellitus without complications: Secondary | ICD-10-CM | POA: Insufficient documentation

## 2012-04-11 DIAGNOSIS — Z8509 Personal history of malignant neoplasm of other digestive organs: Secondary | ICD-10-CM | POA: Insufficient documentation

## 2012-04-11 DIAGNOSIS — E669 Obesity, unspecified: Secondary | ICD-10-CM | POA: Insufficient documentation

## 2012-04-11 DIAGNOSIS — Z79899 Other long term (current) drug therapy: Secondary | ICD-10-CM | POA: Insufficient documentation

## 2012-04-11 LAB — COMPREHENSIVE METABOLIC PANEL
ALT: 45 U/L — ABNORMAL HIGH (ref 0–35)
AST: 64 U/L — ABNORMAL HIGH (ref 0–37)
Albumin: 3.7 g/dL (ref 3.5–5.2)
Alkaline Phosphatase: 90 U/L (ref 39–117)
BUN: 8 mg/dL (ref 6–23)
CO2: 23 mEq/L (ref 19–32)
Calcium: 9.5 mg/dL (ref 8.4–10.5)
Chloride: 104 mEq/L (ref 96–112)
Creatinine, Ser: 0.51 mg/dL (ref 0.50–1.10)
GFR calc Af Amer: >90 mL/min (ref >90)
GFR calc non Af Amer: >90 mL/min (ref >90)
Glucose, Bld: 115 mg/dL — ABNORMAL HIGH (ref 70–99)
Potassium: 4 mEq/L (ref 3.5–5.1)
Sodium: 141 mEq/L (ref 135–145)
Total Bilirubin: 0.3 mg/dL (ref 0.3–1.2)
Total Protein: 7.5 g/dL (ref 6.0–8.3)

## 2012-04-11 LAB — CBC WITH DIFFERENTIAL/PLATELET
Basophils Absolute: 0 10*3/uL (ref 0.0–0.1)
Basophils Relative: 0 % (ref 0–1)
Eosinophils Absolute: 0.8 10*3/uL — ABNORMAL HIGH (ref 0.0–0.7)
Eosinophils Relative: 7 % — ABNORMAL HIGH (ref 0–5)
HCT: 38.9 % (ref 36.0–46.0)
Hemoglobin: 13.6 g/dL (ref 12.0–15.0)
Lymphocytes Relative: 24 % (ref 12–46)
Lymphs Abs: 2.5 10*3/uL (ref 0.7–4.0)
MCH: 28.4 pg (ref 26.0–34.0)
MCHC: 35 g/dL (ref 30.0–36.0)
MCV: 81.2 fL (ref 78.0–100.0)
Monocytes Absolute: 0.5 10*3/uL (ref 0.1–1.0)
Monocytes Relative: 4 % (ref 3–12)
Neutro Abs: 6.7 10*3/uL (ref 1.7–7.7)
Neutrophils Relative %: 64 % (ref 43–77)
Platelets: 244 10*3/uL (ref 150–400)
RBC: 4.79 MIL/uL (ref 3.87–5.11)
RDW: 13.6 % (ref 11.5–15.5)
WBC: 10.5 10*3/uL (ref 4.0–10.5)

## 2012-04-11 LAB — URINALYSIS, ROUTINE W REFLEX MICROSCOPIC
Bilirubin Urine: NEGATIVE
Glucose, UA: NEGATIVE mg/dL
Hgb urine dipstick: NEGATIVE
Ketones, ur: NEGATIVE mg/dL
Nitrite: NEGATIVE
Protein, ur: NEGATIVE mg/dL
Specific Gravity, Urine: 1.024 (ref 1.005–1.030)
Urobilinogen, UA: 0.2 mg/dL (ref 0.0–1.0)
pH: 5 (ref 5.0–8.0)

## 2012-04-11 LAB — URINE MICROSCOPIC-ADD ON

## 2012-04-11 NOTE — ED Notes (Signed)
PT. REPORTS LEFT ABDOMINAL PAIN FOR SEVERAL DAYS WITH SLIGHT NAUSEA , SEEN HERE LAST WEEK FOR THE SAME COMPLAINTS - DISCHARGED HOME , DENIES VOMITTING OR DIARRHEA.

## 2012-04-12 ENCOUNTER — Emergency Department (HOSPITAL_COMMUNITY): Payer: Self-pay

## 2012-04-12 ENCOUNTER — Emergency Department (HOSPITAL_COMMUNITY)
Admission: EM | Admit: 2012-04-12 | Discharge: 2012-04-12 | Disposition: A | Discharge status: Home / Self Care | Payer: Self-pay | Attending: Emergency Medicine | Admitting: Emergency Medicine

## 2012-04-12 ENCOUNTER — Encounter (HOSPITAL_COMMUNITY): Payer: Self-pay | Admitting: Radiology

## 2012-04-12 DIAGNOSIS — N83209 Unspecified ovarian cyst, unspecified side: Secondary | ICD-10-CM

## 2012-04-12 HISTORY — DX: Obesity, unspecified: E66.9

## 2012-04-12 MED ORDER — OXYCODONE-ACETAMINOPHEN 5-325 MG PO TABS: 2.0000 | ORAL_TABLET | ORAL | Status: DC | PRN

## 2012-04-12 MED ORDER — ONDANSETRON HCL 4 MG/2ML IJ SOLN
4.0000 mg | Freq: Once | INTRAMUSCULAR | Status: AC
  Administered 2012-04-12: 4 mg via INTRAVENOUS
  Filled 2012-04-12: qty 2

## 2012-04-12 MED ORDER — HYDROMORPHONE HCL PF 1 MG/ML IJ SOLN
1.0000 mg | Freq: Once | INTRAMUSCULAR | Status: AC
  Administered 2012-04-12: 1 mg via INTRAVENOUS
  Filled 2012-04-12: qty 1

## 2012-04-12 NOTE — ED Provider Notes (Signed)
History     CSN: [id number]  Arrival date & time 04/11/12  2053   First MD Initiated Contact with Patient 04/12/12 0154      Chief Complaint  Patient presents with  . Abdominal Pain    (Consider location/radiation/quality/duration/timing/severity/associated sxs/prior treatment) Patient is a 27 y.o. female presenting with abdominal pain. The history is provided by the patient.  Abdominal Pain Pain location:  Epigastric Pain quality: aching   Pain radiates to:  Does not radiate Pain severity:  Severe Onset quality:  Gradual Timing:  Constant Chronicity:  Recurrent Relieved by:  Nothing Worsened by:  Nothing tried   Past Medical History  Diagnosis Date  . Polycystic ovarian syndrome 07/01/2011    Patient report  . Anxiety   . Depression   . Cancer of abdominal wall   . Rhabdosarcoma   . Hypertension   . Diabetes mellitus without complication   . Obesity     Past Surgical History  Procedure Laterality Date  . Cholecystectomy    . Varicose vein surgery    . Ovarian cyst excision      Family History  Problem Relation Age of Onset  . Coronary artery disease Maternal Grandmother   . Diabetes type II Maternal Grandmother   . Cancer Maternal Grandmother   . Hypertension Mother   . Hypertension Father     History  Substance Use Topics  . Smoking status: Never Smoker   . Smokeless tobacco: Never Used  . Alcohol Use: No    OB History   Grav Para Term Preterm Abortions TAB SAB Ect Mult Living                  Review of Systems  Gastrointestinal: Positive for abdominal pain.  All other systems reviewed and are negative.    Allergies  Morphine and related  Home Medications   Current Outpatient Rx  Name  Route  Sig  Dispense  Refill  . gabapentin (NEURONTIN) 300 MG capsule   Oral   Take 300 mg by mouth 4 (four) times daily. For anxiety/pain         . hydrOXYzine (ATARAX/VISTARIL) 50 MG tablet   Oral   Take 50 mg by mouth every 6 (six) hours as  needed for anxiety.         Marland Kitchen ibuprofen (ADVIL,MOTRIN) 200 MG tablet   Oral   Take 800 mg by mouth every 8 (eight) hours as needed for pain.         . naproxen (NAPROSYN) 500 MG tablet   Oral   Take 1 tablet (500 mg total) by mouth 2 (two) times daily. For pain management   20 tablet   0   . ondansetron (ZOFRAN) 4 MG tablet   Oral   Take 1 tablet (4 mg total) by mouth every 8 (eight) hours as needed for nausea.   20 tablet   0   . ondansetron (ZOFRAN-ODT) 8 MG disintegrating tablet   Oral   Take 8 mg by mouth every 12 (twelve) hours as needed for nausea.         Marland Kitchen oxyCODONE-acetaminophen (PERCOCET/ROXICET) 5-325 MG per tablet   Oral   Take 1 tablet by mouth every 4 (four) hours as needed for pain.   15 tablet   0   . QUEtiapine (SEROQUEL XR) 300 MG 24 hr tablet   Oral   Take 600 mg by mouth at bedtime. Taken with 2 50mg  tablets to equal 700mg  qhs.         Marland Kitchen  QUEtiapine (SEROQUEL XR) 50 MG TB24   Oral   Take 100 mg by mouth at bedtime. Taken with 2 300mg  tablets to equal 700mg  qhs.         . sertraline (ZOLOFT) 100 MG tablet   Oral   Take 150 mg by mouth every evening. For depression         . traMADol (ULTRAM) 50 MG tablet   Oral   Take 1 tablet (50 mg total) by mouth every 6 (six) hours as needed for pain.   15 tablet   0     BP 123/111  Pulse 91  Temp(Src) 98 F (36.7 C) (Oral)  Resp 14  SpO2 96%  LMP 03/20/2012  Physical Exam  Constitutional: She is oriented to person, place, and time. She appears well-developed and well-nourished.  HENT:  Head: Normocephalic and atraumatic.  Eyes: Conjunctivae and EOM are normal. Pupils are equal, round, and reactive to light.  Neck: Normal range of motion.  Cardiovascular: Normal rate, regular rhythm and normal heart sounds.   Pulmonary/Chest: Effort normal and breath sounds normal.  Abdominal: Soft. Bowel sounds are normal. There is tenderness.    Musculoskeletal: Normal range of motion.    Neurological: She is alert and oriented to person, place, and time.  Skin: Skin is warm and dry.  Psychiatric: She has a normal mood and affect. Her behavior is normal.    ED Course  Procedures (including critical care time)  Labs Reviewed  CBC WITH DIFFERENTIAL - Abnormal; Notable for the following:    Eosinophils Relative 7 (*)    Eosinophils Absolute 0.8 (*)    All other components within normal limits  COMPREHENSIVE METABOLIC PANEL - Abnormal; Notable for the following:    Glucose, Bld 115 (*)    AST 64 (*)    ALT 45 (*)    All other components within normal limits  URINALYSIS, ROUTINE W REFLEX MICROSCOPIC - Abnormal; Notable for the following:    APPearance CLOUDY (*)    Leukocytes, UA MODERATE (*)    All other components within normal limits  URINE MICROSCOPIC-ADD ON - Abnormal; Notable for the following:    Squamous Epithelial / LPF MANY (*)    Bacteria, UA FEW (*)    All other components within normal limits  URINE CULTURE  POCT PREGNANCY, URINE   No results found.   No diagnosis found.    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  Recent neg ct scan, labs benign.  Pt does states that pain has become worse.  Will analgesia,  reassess        Rosanne Ashing, MD 04/12/12 [id number]

## 2012-04-12 NOTE — ED Notes (Signed)
Pt reported that she's been experiencing constant pain in the left abdomen radiating down the left leg for 2 weeks.  She has been experiencing N/V with it.  Pain is exacerbated with movement, riding in cars, and using the bathroom.  She had a cyst removal in 2011. She states that it feels like the same type of pain.  She was at [facility] ED for the same type of pain last week.

## 2012-04-13 ENCOUNTER — Inpatient Hospital Stay (HOSPITAL_COMMUNITY)
Admission: AD | Admit: 2012-04-13 | Discharge: 2012-04-14 | Disposition: A | Discharge status: Home / Self Care | Payer: Self-pay | Source: Ambulatory Visit | Attending: Obstetrics & Gynecology | Admitting: Obstetrics & Gynecology

## 2012-04-13 ENCOUNTER — Encounter (HOSPITAL_COMMUNITY): Payer: Self-pay | Admitting: *Deleted

## 2012-04-13 DIAGNOSIS — R1032 Left lower quadrant pain: Secondary | ICD-10-CM | POA: Insufficient documentation

## 2012-04-13 DIAGNOSIS — R338 Other retention of urine: Secondary | ICD-10-CM

## 2012-04-13 DIAGNOSIS — R339 Retention of urine, unspecified: Secondary | ICD-10-CM | POA: Insufficient documentation

## 2012-04-13 LAB — URINALYSIS, ROUTINE W REFLEX MICROSCOPIC
Bilirubin Urine: NEGATIVE
Glucose, UA: NEGATIVE mg/dL
Ketones, ur: NEGATIVE mg/dL
Nitrite: NEGATIVE
Specific Gravity, Urine: 1.025 (ref 1.005–1.030)
pH: 6 (ref 5.0–8.0)

## 2012-04-13 LAB — URINE CULTURE

## 2012-04-13 LAB — URINE MICROSCOPIC-ADD ON

## 2012-04-13 NOTE — MAU Provider Note (Signed)
History     CSN: [id number]  Arrival date and time: 04/13/12 2044   First Provider Initiated Contact with Patient 04/13/12 2206      Chief Complaint  Patient presents with  . Abdominal Pain  . Urinary Retention   HPI Ms. Tina Saunders is a 27 y.o. G0P0 who presents to MAU today with complaint of "unable to urinate." The patient was seen in [facility] yesterday and diagnosed with left ovarian cyst as a source of her LLQ pain. The patient was told to follow-up with MAU if any further issues. She has a history of an ovarian cyst that caused a blockage of her bladder in 2011. At that time she had similar symptoms to now. She cyst was drained. She then had urinary incontinence for sometime because of extended use of foley catheter. The patient states that yesterday she had discomfort with urination and had to "squeeze it out" but today she has had no urine output since 11 am. Her pain is in the LLQ and rates at 9/10. She states that the pain is dull and stabbing. She has some nausea without vomiting x 2 weeks. She denies fever, vaginal bleeding or abnormal vaginal discharge. LMP was 01/04/12. She has very irregular periods secondary to PCOS. She has not taken anything for the pain as she states that "nothing works."   OB History   Grav Para Term Preterm Abortions TAB SAB Ect Mult Living   0               Past Medical History  Diagnosis Date  . Polycystic ovarian syndrome 07/01/2011    Patient report  . Anxiety   . Depression   . Cancer of abdominal wall   . Rhabdosarcoma   . Hypertension   . Diabetes mellitus without complication   . Obesity     Past Surgical History  Procedure Laterality Date  . Cholecystectomy    . Varicose vein surgery    . Ovarian cyst excision      Family History  Problem Relation Age of Onset  . Coronary artery disease Maternal Grandmother   . Diabetes type II Maternal Grandmother   . Cancer Maternal Grandmother   . Hypertension Mother   . Hypertension  Father     History  Substance Use Topics  . Smoking status: Never Smoker   . Smokeless tobacco: Never Used  . Alcohol Use: No    Allergies:  Allergies  Allergen Reactions  . Morphine And Related Hives, Itching and Other (See Comments)    GI upset    No prescriptions prior to admission    Review of Systems  Constitutional: Negative for fever and malaise/fatigue.  Gastrointestinal: Positive for nausea, abdominal pain and diarrhea. Negative for vomiting and constipation.  Genitourinary: Positive for dysuria. Negative for urgency and frequency.       Neg -vaginal bleeding Neg - abnormal vaginal discharge  Musculoskeletal: Negative for back pain.  Neurological: Negative for dizziness and loss of consciousness.   Physical Exam   Blood pressure 128/90, pulse 98, resp. rate 20, height 5\' 4"  (1.626 m), weight 259 lb 6.4 oz (117.663 kg), last menstrual period 01/04/2012, SpO2 98.00%.  Physical Exam  Constitutional: She is oriented to person, place, and time. She appears well-developed and well-nourished. No distress.  HENT:  Head: Normocephalic and atraumatic.  Cardiovascular: Normal rate, regular rhythm and normal heart sounds.   Respiratory: Effort normal and breath sounds normal. No respiratory distress.  GI: Soft. Bowel sounds are  normal. She exhibits no distension. There is tenderness (LLQ pain). There is no rebound and no guarding.  Genitourinary: Uterus is tender. Uterus is not enlarged. Cervix exhibits discharge (small amount of white mucus discharge noted). Cervix exhibits no motion tenderness and no friability. Right adnexum displays no mass and no tenderness. Left adnexum displays tenderness. Left adnexum displays no mass.  Bright red erythema at the introitus and around the urethra  Neurological: She is alert and oriented to person, place, and time.  Skin: Skin is warm and dry. No erythema.  Psychiatric: She has a normal mood and affect.   Results for orders placed  during the hospital encounter of 04/13/12 (from the past 24 hour(s))  URINALYSIS, ROUTINE W REFLEX MICROSCOPIC     Status: Abnormal   Collection Time    04/13/12 11:02 PM      Result Value Range   Color, Urine YELLOW  YELLOW   APPearance CLEAR  CLEAR   Specific Gravity, Urine 1.025  1.005 - 1.030   pH 6.0  5.0 - 8.0   Glucose, UA NEGATIVE  NEGATIVE mg/dL   Hgb urine dipstick SMALL (*) NEGATIVE   Bilirubin Urine NEGATIVE  NEGATIVE   Ketones, ur NEGATIVE  NEGATIVE mg/dL   Protein, ur NEGATIVE  NEGATIVE mg/dL   Urobilinogen, UA 0.2  0.0 - 1.0 mg/dL   Nitrite NEGATIVE  NEGATIVE   Leukocytes, UA NEGATIVE  NEGATIVE  URINE MICROSCOPIC-ADD ON     Status: Abnormal   Collection Time    04/13/12 11:02 PM      Result Value Range   Squamous Epithelial / LPF RARE  RARE   WBC, UA 0-2  <3 WBC/hpf   RBC / HPF 3-6  <3 RBC/hpf   Bacteria, UA FEW (*) RARE  WET PREP, GENITAL     Status: Abnormal   Collection Time    04/14/12 12:37 AM      Result Value Range   Yeast Wet Prep HPF POC NONE SEEN  NONE SEEN   Trich, Wet Prep NONE SEEN  NONE SEEN   Clue Cells Wet Prep HPF POC FEW (*) NONE SEEN   WBC, Wet Prep HPF POC MODERATE (*) NONE SEEN   Bladder scan prior to cath: 230 cc Bladder scan post cath: 58 cc Patient drank a pitcher of water. Feels pain, but not an urge to void. Attempts to void on her own without success x 2 Bladder scan: 490 cc Straight cath used to empty patient's bladder prior to discharge MAU Course  Procedures None  MDM Discussed with Dr. Debroah Loop. Bladder scan patient prior to and following straight catheterization.  Patient may be discharged with Rx for in-and-out catheters PRN until Monday when she can follow-up with her oncologist for further eval and possible referral Patient agrees with the plan as she does not want an indwelling catheter  Assessment and Plan  A: Urinary retention, unknown cause  P: Discharge home Rx for in-and -out catheters given to the  patient Patient may follow up in MAU as needed, but advised that [facility] would have specialists available on call if symptoms worsen Patient encouraged to follow up Monday as scheduled with oncology for further evaluation of symptoms and possible urology referral  Freddi Starr, PA-C  04/14/2012, 7:12 AM

## 2012-04-13 NOTE — Treatment Plan (Signed)
Pt. Feeling better after quick cath.

## 2012-04-13 NOTE — MAU Note (Signed)
Pain in LLQ since yesterday and can't pee. I could pee alittle yesterday but cannot today. Have hx PCOS and this happened before and had to drain cyst cause was blocking bladder. States was seen [facility] ED last night and told had several cysts on L ovary.

## 2012-04-14 LAB — WET PREP, GENITAL
Trich, Wet Prep: NONE SEEN
Yeast Wet Prep HPF POC: NONE SEEN

## 2012-04-14 MED ORDER — KETOROLAC TROMETHAMINE 60 MG/2ML IM SOLN
60.0000 mg | Freq: Once | INTRAMUSCULAR | Status: AC
  Administered 2012-04-14: 60 mg via INTRAMUSCULAR
  Filled 2012-04-14: qty 2

## 2012-04-14 MED ORDER — CATHETERS KIT: PACK | Status: DC

## 2012-04-15 LAB — GC/CHLAMYDIA PROBE AMP: GC Probe RNA: NEGATIVE

## 2012-04-16 ENCOUNTER — Emergency Department (HOSPITAL_COMMUNITY)
Admission: EM | Admit: 2012-04-16 | Discharge: 2012-04-16 | Disposition: A | Discharge status: Home / Self Care | Payer: Self-pay | Attending: Emergency Medicine | Admitting: Emergency Medicine

## 2012-04-16 ENCOUNTER — Encounter (HOSPITAL_COMMUNITY): Payer: Self-pay | Admitting: Emergency Medicine

## 2012-04-16 ENCOUNTER — Emergency Department (HOSPITAL_COMMUNITY): Payer: Self-pay

## 2012-04-16 DIAGNOSIS — Z8742 Personal history of other diseases of the female genital tract: Secondary | ICD-10-CM | POA: Insufficient documentation

## 2012-04-16 DIAGNOSIS — F3289 Other specified depressive episodes: Secondary | ICD-10-CM | POA: Insufficient documentation

## 2012-04-16 DIAGNOSIS — I10 Essential (primary) hypertension: Secondary | ICD-10-CM | POA: Insufficient documentation

## 2012-04-16 DIAGNOSIS — R5383 Other fatigue: Secondary | ICD-10-CM | POA: Insufficient documentation

## 2012-04-16 DIAGNOSIS — R05 Cough: Secondary | ICD-10-CM | POA: Insufficient documentation

## 2012-04-16 DIAGNOSIS — R5381 Other malaise: Secondary | ICD-10-CM | POA: Insufficient documentation

## 2012-04-16 DIAGNOSIS — Z79899 Other long term (current) drug therapy: Secondary | ICD-10-CM | POA: Insufficient documentation

## 2012-04-16 DIAGNOSIS — E119 Type 2 diabetes mellitus without complications: Secondary | ICD-10-CM | POA: Insufficient documentation

## 2012-04-16 DIAGNOSIS — J209 Acute bronchitis, unspecified: Secondary | ICD-10-CM | POA: Insufficient documentation

## 2012-04-16 DIAGNOSIS — J3489 Other specified disorders of nose and nasal sinuses: Secondary | ICD-10-CM | POA: Insufficient documentation

## 2012-04-16 DIAGNOSIS — F411 Generalized anxiety disorder: Secondary | ICD-10-CM | POA: Insufficient documentation

## 2012-04-16 DIAGNOSIS — R059 Cough, unspecified: Secondary | ICD-10-CM | POA: Insufficient documentation

## 2012-04-16 DIAGNOSIS — J40 Bronchitis, not specified as acute or chronic: Secondary | ICD-10-CM

## 2012-04-16 DIAGNOSIS — Z85828 Personal history of other malignant neoplasm of skin: Secondary | ICD-10-CM | POA: Insufficient documentation

## 2012-04-16 DIAGNOSIS — F329 Major depressive disorder, single episode, unspecified: Secondary | ICD-10-CM | POA: Insufficient documentation

## 2012-04-16 DIAGNOSIS — R079 Chest pain, unspecified: Secondary | ICD-10-CM | POA: Insufficient documentation

## 2012-04-16 DIAGNOSIS — IMO0001 Reserved for inherently not codable concepts without codable children: Secondary | ICD-10-CM | POA: Insufficient documentation

## 2012-04-16 LAB — BASIC METABOLIC PANEL
BUN: 7 mg/dL (ref 6–23)
CO2: 20 mEq/L (ref 19–32)
Calcium: 9 mg/dL (ref 8.4–10.5)
Chloride: 103 mEq/L (ref 96–112)
Creatinine, Ser: 0.48 mg/dL — ABNORMAL LOW (ref 0.50–1.10)
GFR calc Af Amer: >90 mL/min (ref >90)

## 2012-04-16 LAB — CBC WITH DIFFERENTIAL/PLATELET
Basophils Relative: 0 % (ref 0–1)
Eosinophils Relative: 4 % (ref 0–5)
HCT: 38 % (ref 36.0–46.0)
Hemoglobin: 13.1 g/dL (ref 12.0–15.0)
MCHC: 34.5 g/dL (ref 30.0–36.0)
MCV: 81.9 fL (ref 78.0–100.0)
Monocytes Absolute: 0.3 10*3/uL (ref 0.1–1.0)
Monocytes Relative: 4 % (ref 3–12)
Neutro Abs: 6.9 10*3/uL (ref 1.7–7.7)

## 2012-04-16 MED ORDER — HYDROCODONE-HOMATROPINE 5-1.5 MG/5ML PO SYRP: 5.0000 mL | ORAL_SOLUTION | Freq: Four times a day (QID) | ORAL | Status: DC | PRN

## 2012-04-16 MED ORDER — PREDNISONE 20 MG PO TABS: 60.0000 mg | ORAL_TABLET | Freq: Every day | ORAL | Status: DC

## 2012-04-16 MED ORDER — HYDROMORPHONE HCL PF 1 MG/ML IJ SOLN
1.0000 mg | Freq: Once | INTRAMUSCULAR | Status: AC
  Administered 2012-04-16: 1 mg via INTRAVENOUS
  Filled 2012-04-16: qty 1

## 2012-04-16 MED ORDER — ALBUTEROL SULFATE HFA 108 (90 BASE) MCG/ACT IN AERS: 2.0000 | INHALATION_SPRAY | RESPIRATORY_TRACT | Status: DC | PRN

## 2012-04-16 MED ORDER — DIPHENHYDRAMINE HCL 50 MG/ML IJ SOLN
25.0000 mg | Freq: Once | INTRAMUSCULAR | Status: AC
  Administered 2012-04-16: 25 mg via INTRAVENOUS
  Filled 2012-04-16: qty 1

## 2012-04-16 MED ORDER — AZITHROMYCIN 250 MG PO TABS: ORAL_TABLET | ORAL | Status: DC

## 2012-04-16 MED ORDER — POTASSIUM CHLORIDE CRYS ER 20 MEQ PO TBCR: 40.0000 meq | EXTENDED_RELEASE_TABLET | Freq: Every day | ORAL | Status: DC

## 2012-04-16 NOTE — ED Notes (Signed)
Per EMS: pt c/o shortness of breath starting today; non productive cough that started about 1 week ago; pt had sudden onset of shortness of breath; upon arrival pt had chest pain but currently denies chest pain and c/o generalized body aches; pt is cancer pt and had chemo yesterday; pt states she pneumonia in October; pt states this feels similar; pt from home; inspiratory wheezing heard all lobes; 5mg  albuterol given en route; 98-100% on room air; NSR on 12 lead and unremarkable; BP 150/98 HR 1115-130's CBG 134; port in right upper chest; 22G in left hand; 125 solumedrol given and 4 mg zofran given; pt currently on 5mg  albuterol and 0.5 mg Atrovent

## 2012-04-16 NOTE — ED Notes (Signed)
Pt states that she is unable to go to the bathroom; pt states that she hurts all over

## 2012-04-16 NOTE — ED Notes (Signed)
Pt alert and mentating appropriately upon d/c teaching and prescriptions; pt given d/c teaching and prescriptions; pt verbalizes understanding of d/c teaching and prescriptions; pt has no further questions upon d/c; pt instructed not to drive and states family taking her home; family at bedside endorses pt will not be driving; NAD noted at d/c; pt taken out via wheelchair to family vehicle

## 2012-04-16 NOTE — ED Provider Notes (Signed)
History     CSN: [id number]  Arrival date & time 04/16/12  1326   First MD Initiated Contact with Patient 04/16/12 1327      Chief Complaint  Patient presents with  . Shortness of Breath    (Consider location/radiation/quality/duration/timing/severity/associated sxs/prior treatment) HPI Comments: Patient presents to the ER for evaluation of shortness of breath. Patient reports that she has been sick for approximately a week with cough and chest congestion. Today, however, she had sudden worsening of her breathing. She is not complaining of severe shortness of breath. Patient was complaining of chest pain earlier, but that has resolved and now she is complaining of generalized aching pains all over, flulike symptoms. Denies pain is moderate to severe. Patient says she had pneumonia last year and this feels similar.  Patient is a 27 y.o. female presenting with shortness of breath.  Shortness of Breath Associated symptoms: chest pain     Past Medical History  Diagnosis Date  . Polycystic ovarian syndrome 07/01/2011    Patient report  . Anxiety   . Depression   . Cancer of abdominal wall   . Rhabdosarcoma   . Hypertension   . Diabetes mellitus without complication   . Obesity     Past Surgical History  Procedure Laterality Date  . Cholecystectomy    . Varicose vein surgery    . Ovarian cyst excision      Family History  Problem Relation Age of Onset  . Coronary artery disease Maternal Grandmother   . Diabetes type II Maternal Grandmother   . Cancer Maternal Grandmother   . Hypertension Mother   . Hypertension Father     History  Substance Use Topics  . Smoking status: Never Smoker   . Smokeless tobacco: Never Used  . Alcohol Use: No    OB History   Grav Para Term Preterm Abortions TAB SAB Ect Mult Living   0               Review of Systems  Constitutional: Positive for fatigue.  Respiratory: Positive for shortness of breath.   Cardiovascular: Positive for  chest pain.  Musculoskeletal: Positive for myalgias.  All other systems reviewed and are negative.    Allergies  Morphine and related  Home Medications   Current Outpatient Rx  Name  Route  Sig  Dispense  Refill  . Catheters KIT      Use in and out cath PRN for bladder emptying   10 each   0   . gabapentin (NEURONTIN) 300 MG capsule   Oral   Take 300 mg by mouth 4 (four) times daily. For anxiety/pain         . hydrOXYzine (ATARAX/VISTARIL) 50 MG tablet   Oral   Take 100 mg by mouth every 6 (six) hours as needed for anxiety.          . naproxen (NAPROSYN) 500 MG tablet   Oral   Take 1 tablet (500 mg total) by mouth 2 (two) times daily. For pain management   20 tablet   0   . ondansetron (ZOFRAN-ODT) 8 MG disintegrating tablet   Oral   Take 8 mg by mouth every 12 (twelve) hours as needed for nausea.         Marland Kitchen oxyCODONE-acetaminophen (PERCOCET/ROXICET) 5-325 MG per tablet   Oral   Take 2 tablets by mouth every 4 (four) hours as needed for pain.   15 tablet   0   . pantoprazole (PROTONIX)  40 MG tablet   Oral   Take 40 mg by mouth daily.         . QUEtiapine (SEROQUEL XR) 300 MG 24 hr tablet   Oral   Take 600 mg by mouth at bedtime. Take with 50mg  tablets also         . QUEtiapine (SEROQUEL XR) 50 MG TB24   Oral   Take 50 mg by mouth at bedtime. Takes with 300mg  tablets  also         . sertraline (ZOLOFT) 100 MG tablet   Oral   Take 150 mg by mouth every evening. For depression           BP 94/81  Pulse 127  Temp(Src) 98 F (36.7 C) (Oral)  Resp 28  SpO2 100%  LMP 01/04/2012  Physical Exam  Constitutional: She is oriented to person, place, and time. She appears well-developed and well-nourished. She appears distressed.  HENT:  Head: Normocephalic and atraumatic.  Right Ear: Hearing normal.  Nose: Nose normal.  Mouth/Throat: Oropharynx is clear and moist and mucous membranes are normal.  Eyes: Conjunctivae and EOM are normal. Pupils  are equal, round, and reactive to light.  Neck: Normal range of motion. Neck supple.  Cardiovascular: Normal rate, regular rhythm, S1 normal and S2 normal.  Exam reveals no gallop and no friction rub.   No murmur heard. Pulmonary/Chest: Effort normal. No respiratory distress. She has no wheezes. She has no rales. She exhibits no tenderness.  Patient seems to be tachypneic. There is no wheezing noted. Patient has first and will raise the inspiratory breathing. This does not sound like stridor, more like airway resonance or rhonchi. This seems like the patient is trying to force a wheeze rather than a true finding.  Abdominal: Soft. Normal appearance and bowel sounds are normal. There is no hepatosplenomegaly. There is no tenderness. There is no rebound, no guarding, no tenderness at McBurney's point and negative Murphy's sign. No hernia.  Musculoskeletal: Normal range of motion.  Neurological: She is alert and oriented to person, place, and time. She has normal strength. No cranial nerve deficit or sensory deficit. Coordination normal. GCS eye subscore is 4. GCS verbal subscore is 5. GCS motor subscore is 6.  Skin: Skin is warm, dry and intact. No rash noted. No cyanosis.  Psychiatric: She has a normal mood and affect. Her speech is normal and behavior is normal. Thought content normal.    ED Course  Procedures (including critical care time)  EKG:  Date: 04/16/2012  Rate: 121  Rhythm: sinus tachycardia  QRS Axis: normal  Intervals: normal  ST/T Wave abnormalities: nonspecific ST/T changes  Conduction Disutrbances:none  Narrative Interpretation:   Old EKG Reviewed: unchanged    Labs Reviewed  BASIC METABOLIC PANEL - Abnormal; Notable for the following:    Potassium 3.0 (*)    Glucose, Bld 137 (*)    Creatinine, Ser 0.48 (*)    All other components within normal limits  CBC WITH DIFFERENTIAL  PRO B NATRIURETIC PEPTIDE  TROPONIN I  D-DIMER, QUANTITATIVE   Dg Chest 2  View  04/16/2012  *RADIOLOGY REPORT*  Clinical Data: Cough and shortness of breath  CHEST - 2 VIEW  Comparison: March 18, 2012  Findings:  Lungs clear.  Heart size and pulmonary vascularity are normal.  No adenopathy.  No bone lesions.  IMPRESSION: No abnormality noted.   Original Report Authenticated By: Bretta Bang, M.D.      Diagnosis: Bronchitis  MDM  Patient comes to the ER with complaints of cough and difficulty breathing. Patient had audible breath sounds at arrival, but auscultation did not reveal any wheezing in the airways. Patient seemed to be forcing her expirations and inspirations, resulting in noisy breathing but there was no hypoxia. Chest x-ray was clear. All of her workup was negative. EKG did not show any abnormalities other than mild tachycardia secondary to the albuterol. Troponin was negative. D-dimer was negative. Patient had mild hypokalemia of unclear etiology. Will be prescribed Zithromax, prednisone, albuterol, Hycodan and potassium supplementation.        Gilda Crease, MD 04/16/12 1524

## 2012-04-16 NOTE — ED Notes (Signed)
Pt returned from Radiology; placed back on monitor

## 2012-04-16 NOTE — ED Notes (Signed)
Pt denies itching or difficulty breathing after pain medication given; pt alert and mentating appropriately

## 2012-04-24 ENCOUNTER — Emergency Department (HOSPITAL_COMMUNITY)
Admission: EM | Admit: 2012-04-24 | Discharge: 2012-04-24 | Disposition: A | Discharge status: Home / Self Care | Payer: Self-pay | Attending: Emergency Medicine | Admitting: Emergency Medicine

## 2012-04-24 ENCOUNTER — Encounter (HOSPITAL_COMMUNITY): Payer: Self-pay | Admitting: *Deleted

## 2012-04-24 DIAGNOSIS — Z9089 Acquired absence of other organs: Secondary | ICD-10-CM | POA: Insufficient documentation

## 2012-04-24 DIAGNOSIS — Z862 Personal history of diseases of the blood and blood-forming organs and certain disorders involving the immune mechanism: Secondary | ICD-10-CM | POA: Insufficient documentation

## 2012-04-24 DIAGNOSIS — Z85028 Personal history of other malignant neoplasm of stomach: Secondary | ICD-10-CM | POA: Insufficient documentation

## 2012-04-24 DIAGNOSIS — Z8589 Personal history of malignant neoplasm of other organs and systems: Secondary | ICD-10-CM | POA: Insufficient documentation

## 2012-04-24 DIAGNOSIS — Z8639 Personal history of other endocrine, nutritional and metabolic disease: Secondary | ICD-10-CM | POA: Insufficient documentation

## 2012-04-24 DIAGNOSIS — R52 Pain, unspecified: Secondary | ICD-10-CM | POA: Insufficient documentation

## 2012-04-24 DIAGNOSIS — F411 Generalized anxiety disorder: Secondary | ICD-10-CM | POA: Insufficient documentation

## 2012-04-24 DIAGNOSIS — F3289 Other specified depressive episodes: Secondary | ICD-10-CM | POA: Insufficient documentation

## 2012-04-24 DIAGNOSIS — F329 Major depressive disorder, single episode, unspecified: Secondary | ICD-10-CM | POA: Insufficient documentation

## 2012-04-24 DIAGNOSIS — R197 Diarrhea, unspecified: Secondary | ICD-10-CM | POA: Insufficient documentation

## 2012-04-24 DIAGNOSIS — Z79899 Other long term (current) drug therapy: Secondary | ICD-10-CM | POA: Insufficient documentation

## 2012-04-24 DIAGNOSIS — E119 Type 2 diabetes mellitus without complications: Secondary | ICD-10-CM | POA: Insufficient documentation

## 2012-04-24 DIAGNOSIS — E669 Obesity, unspecified: Secondary | ICD-10-CM | POA: Insufficient documentation

## 2012-04-24 DIAGNOSIS — I10 Essential (primary) hypertension: Secondary | ICD-10-CM | POA: Insufficient documentation

## 2012-04-24 DIAGNOSIS — Z5111 Encounter for antineoplastic chemotherapy: Secondary | ICD-10-CM | POA: Insufficient documentation

## 2012-04-24 DIAGNOSIS — R109 Unspecified abdominal pain: Secondary | ICD-10-CM

## 2012-04-24 DIAGNOSIS — R112 Nausea with vomiting, unspecified: Secondary | ICD-10-CM | POA: Insufficient documentation

## 2012-04-24 DIAGNOSIS — R1084 Generalized abdominal pain: Secondary | ICD-10-CM | POA: Insufficient documentation

## 2012-04-24 LAB — CBC WITH DIFFERENTIAL/PLATELET
Basophils Absolute: 0 10*3/uL (ref 0.0–0.1)
Lymphocytes Relative: 36 % (ref 12–46)
Lymphs Abs: 2.8 10*3/uL (ref 0.7–4.0)
Neutro Abs: 4.3 10*3/uL (ref 1.7–7.7)
Neutrophils Relative %: 54 % (ref 43–77)
Platelets: 244 10*3/uL (ref 150–400)
RBC: 4.9 MIL/uL (ref 3.87–5.11)
RDW: 13.9 % (ref 11.5–15.5)
WBC: 7.9 10*3/uL (ref 4.0–10.5)

## 2012-04-24 LAB — POCT I-STAT, CHEM 8
BUN: 16 mg/dL (ref 6–23)
Calcium, Ion: 1.17 mmol/L (ref 1.12–1.23)
Chloride: 104 mEq/L (ref 96–112)
Creatinine, Ser: 0.4 mg/dL — ABNORMAL LOW (ref 0.50–1.10)
Sodium: 137 mEq/L (ref 135–145)
TCO2: 23 mmol/L (ref 0–100)

## 2012-04-24 LAB — HEPATIC FUNCTION PANEL
ALT: 37 U/L — ABNORMAL HIGH (ref 0–35)
Albumin: 3.3 g/dL — ABNORMAL LOW (ref 3.5–5.2)
Alkaline Phosphatase: 123 U/L — ABNORMAL HIGH (ref 39–117)
Total Protein: 7.2 g/dL (ref 6.0–8.3)

## 2012-04-24 MED ORDER — ONDANSETRON HCL 4 MG/2ML IJ SOLN
4.0000 mg | Freq: Once | INTRAMUSCULAR | Status: AC
  Administered 2012-04-24: 4 mg via INTRAVENOUS
  Filled 2012-04-24: qty 2

## 2012-04-24 MED ORDER — HYDROMORPHONE HCL PF 1 MG/ML IJ SOLN
1.0000 mg | Freq: Once | INTRAMUSCULAR | Status: AC
  Administered 2012-04-24: 1 mg via INTRAVENOUS
  Filled 2012-04-24: qty 1

## 2012-04-24 MED ORDER — ONDANSETRON HCL 4 MG PO TABS: 4.0000 mg | ORAL_TABLET | Freq: Four times a day (QID) | ORAL | Status: DC

## 2012-04-24 NOTE — ED Notes (Signed)
NAD noted at time of d/c home 

## 2012-04-24 NOTE — ED Provider Notes (Signed)
Medical screening examination/treatment/procedure(s) were performed by non-physician practitioner and as supervising physician I was immediately available for consultation/collaboration.   Carleene Cooper III, MD 04/24/12 2203

## 2012-04-24 NOTE — ED Notes (Signed)
Pt reports onset last night of nausea, diarrhea, generalized body aches. Pt currently getting chemo, last tx was Monday.

## 2012-04-24 NOTE — ED Provider Notes (Signed)
History     CSN: [id number]  Arrival date & time 04/24/12  0800   First MD Initiated Contact with Patient 04/24/12 [phone]      Chief Complaint  Patient presents with  . Nausea    (Consider location/radiation/quality/duration/timing/severity/associated sxs/prior treatment) HPI  27 year old female with history of rhabdosarcoma currently receiving chemotherapy treatment, history of polycystic ovarian syndrome and history of diabetes presents complaining of nausea and diarrhea. Patient reports since yesterday she has developed gradual onset of generalized body aches and abdominal pain with associate nausea and diarrhea. Has had more than 10 bouts of nonbloody, non-mucous diarrhea. Pain is described as a dull and achy sensation to the abdomen that felt similar to prior abdominal pain. She has tried over-the-counter pain medication at home without relief. She denies fever, chills, headache, chest pain, shortness of breath, dysuria, or rash. She is currently receiving chemotherapy treatment since January for her rhabdosarcoma. Her last abdominal CT scan was over a week ago that shows enlarging ovarian cyst.  Past Medical History  Diagnosis Date  . Polycystic ovarian syndrome 07/01/2011    Patient report  . Anxiety   . Depression   . Cancer of abdominal wall   . Rhabdosarcoma   . Hypertension   . Diabetes mellitus without complication   . Obesity     Past Surgical History  Procedure Laterality Date  . Cholecystectomy    . Varicose vein surgery    . Ovarian cyst excision      Family History  Problem Relation Age of Onset  . Coronary artery disease Maternal Grandmother   . Diabetes type II Maternal Grandmother   . Cancer Maternal Grandmother   . Hypertension Mother   . Hypertension Father     History  Substance Use Topics  . Smoking status: Never Smoker   . Smokeless tobacco: Never Used  . Alcohol Use: No    OB History   Grav Para Term Preterm Abortions TAB SAB Ect Mult  Living   0               Review of Systems  Constitutional:       10 Systems reviewed and all are negative for acute change except as noted in the HPI.     Allergies  Morphine and related  Home Medications   Current Outpatient Rx  Name  Route  Sig  Dispense  Refill  . albuterol (PROVENTIL HFA;VENTOLIN HFA) 108 (90 BASE) MCG/ACT inhaler   Inhalation   Inhale 2 puffs into the lungs every 4 (four) hours as needed for wheezing.   1 Inhaler   0   . azithromycin (ZITHROMAX Z-PAK) 250 MG tablet      2 po day one, then 1 daily x 4 days   5 tablet   0   . Catheters KIT      Use in and out cath PRN for bladder emptying   10 each   0   . gabapentin (NEURONTIN) 300 MG capsule   Oral   Take 300 mg by mouth 4 (four) times daily. For anxiety/pain         . HYDROcodone-homatropine (HYCODAN) 5-1.5 MG/5ML syrup   Oral   Take 5 mLs by mouth every 6 (six) hours as needed for cough.   120 mL   0   . hydrOXYzine (ATARAX/VISTARIL) 50 MG tablet   Oral   Take 100 mg by mouth every 6 (six) hours as needed for anxiety.          Marland Kitchen  naproxen (NAPROSYN) 500 MG tablet   Oral   Take 1 tablet (500 mg total) by mouth 2 (two) times daily. For pain management   20 tablet   0   . ondansetron (ZOFRAN-ODT) 8 MG disintegrating tablet   Oral   Take 8 mg by mouth every 12 (twelve) hours as needed for nausea.         Marland Kitchen oxyCODONE-acetaminophen (PERCOCET/ROXICET) 5-325 MG per tablet   Oral   Take 2 tablets by mouth every 4 (four) hours as needed for pain.   15 tablet   0   . pantoprazole (PROTONIX) 40 MG tablet   Oral   Take 40 mg by mouth daily.         . potassium chloride SA (K-DUR,KLOR-CON) 20 MEQ tablet   Oral   Take 2 tablets (40 mEq total) by mouth daily.   6 tablet   0   . predniSONE (DELTASONE) 20 MG tablet   Oral   Take 3 tablets (60 mg total) by mouth daily.   15 tablet   0   . QUEtiapine (SEROQUEL XR) 300 MG 24 hr tablet   Oral   Take 600 mg by mouth at  bedtime. Take with 50mg  tablets also         . QUEtiapine (SEROQUEL XR) 50 MG TB24   Oral   Take 50 mg by mouth at bedtime. Takes with 300mg  tablets  also         . sertraline (ZOLOFT) 100 MG tablet   Oral   Take 150 mg by mouth every evening. For depression           BP 157/76  Pulse 116  Temp(Src) 97.8 F (36.6 C) (Oral)  Resp 18  SpO2 98%  LMP 04/16/2012  Physical Exam  Nursing note and vitals reviewed. Constitutional: She is oriented to person, place, and time. She appears well-developed and well-nourished. No distress.  Awake, alert, nontoxic appearance. Morbidly obese.  HENT:  Head: Atraumatic.  Mouth/Throat: Oropharynx is clear and moist.  Eyes: Conjunctivae are normal. Right eye exhibits no discharge. Left eye exhibits no discharge.  Neck: Neck supple.  Cardiovascular: Normal rate and regular rhythm.   Pulmonary/Chest: Effort normal. No respiratory distress. She exhibits no tenderness.  Abdominal: Soft. There is tenderness (generalized abdominal tenderness, mild, more specifically to left midabdomen without evidence of abscess or overlying skin changes. No hernia noted. No mass noted.). There is no rebound.  Musculoskeletal: She exhibits no tenderness.  ROM appears intact, no obvious focal weakness  Neurological: She is alert and oriented to person, place, and time.  Mental status and motor strength appears intact  Skin: No rash noted.  Psychiatric: She has a normal mood and affect.    ED Course  Procedures (including critical care time)  8:41 AM Patient presents with generalized body aches and abdominal pain with associate nausea and diarrhea. Symptoms is similar to prior abdominal pain that she has in the past. She does have history of rhabdosarcoma currently on chemotherapy. She has had a recent abdominal CT scan a week ago without significant finding.  Plan to pain managed. Patient otherwise appears to be in no acute distress, afebrile with stable normal  vital sign.  9:15 AM Patient reports since being on chemotherapy she has suffered from urinary incontinence for the past 3 weeks. Her Dr. is aware of that and states that there is nothing to be done at this time. We'll not obtain urine today as pt has no  other urinary complaints.  10:47 AM Patient reports feeling better. Nausea has improved. He is able to tolerates by mouth. Request to be discharged. Patient agrees to follow up with her Dr. for further management. Patient is stable for discharge.  Labs Reviewed  HEPATIC FUNCTION PANEL - Abnormal; Notable for the following:    Albumin 3.3 (*)    AST 43 (*)    ALT 37 (*)    Alkaline Phosphatase 123 (*)    Total Bilirubin 0.2 (*)    All other components within normal limits  POCT I-STAT, CHEM 8 - Abnormal; Notable for the following:    Creatinine, Ser 0.40 (*)    Glucose, Bld 177 (*)    All other components within normal limits  CBC WITH DIFFERENTIAL   No results found.   1. Abdominal pain   2. Nausea vomiting and diarrhea       MDM  BP 157/76  Pulse 116  Temp(Src) 97.8 F (36.6 C) (Oral)  Resp 18  SpO2 98%  LMP 04/16/2012  I have reviewed nursing notes and vital signs.  I reviewed available ER/hospitalization records thought the EMR         Fayrene Helper, [place] 04/24/12 1050

## 2012-05-01 ENCOUNTER — Encounter (HOSPITAL_COMMUNITY): Payer: Self-pay | Admitting: Family Medicine

## 2012-05-01 ENCOUNTER — Emergency Department (HOSPITAL_COMMUNITY)
Admission: EM | Admit: 2012-05-01 | Discharge: 2012-05-02 | Disposition: A | Discharge status: Other Acute Inpatient Hospital | Payer: Self-pay | Attending: *Deleted | Admitting: *Deleted

## 2012-05-01 DIAGNOSIS — R112 Nausea with vomiting, unspecified: Secondary | ICD-10-CM | POA: Insufficient documentation

## 2012-05-01 DIAGNOSIS — F3289 Other specified depressive episodes: Secondary | ICD-10-CM | POA: Insufficient documentation

## 2012-05-01 DIAGNOSIS — IMO0001 Reserved for inherently not codable concepts without codable children: Secondary | ICD-10-CM | POA: Insufficient documentation

## 2012-05-01 DIAGNOSIS — Z8742 Personal history of other diseases of the female genital tract: Secondary | ICD-10-CM | POA: Insufficient documentation

## 2012-05-01 DIAGNOSIS — C494 Malignant neoplasm of connective and soft tissue of abdomen: Secondary | ICD-10-CM | POA: Insufficient documentation

## 2012-05-01 DIAGNOSIS — R45851 Suicidal ideations: Secondary | ICD-10-CM | POA: Insufficient documentation

## 2012-05-01 DIAGNOSIS — F411 Generalized anxiety disorder: Secondary | ICD-10-CM | POA: Insufficient documentation

## 2012-05-01 DIAGNOSIS — F329 Major depressive disorder, single episode, unspecified: Secondary | ICD-10-CM | POA: Insufficient documentation

## 2012-05-01 DIAGNOSIS — I10 Essential (primary) hypertension: Secondary | ICD-10-CM | POA: Insufficient documentation

## 2012-05-01 DIAGNOSIS — E119 Type 2 diabetes mellitus without complications: Secondary | ICD-10-CM | POA: Insufficient documentation

## 2012-05-01 DIAGNOSIS — H5316 Psychophysical visual disturbances: Secondary | ICD-10-CM | POA: Insufficient documentation

## 2012-05-01 DIAGNOSIS — E669 Obesity, unspecified: Secondary | ICD-10-CM | POA: Insufficient documentation

## 2012-05-01 DIAGNOSIS — Z3202 Encounter for pregnancy test, result negative: Secondary | ICD-10-CM | POA: Insufficient documentation

## 2012-05-01 DIAGNOSIS — Z79899 Other long term (current) drug therapy: Secondary | ICD-10-CM | POA: Insufficient documentation

## 2012-05-01 DIAGNOSIS — R109 Unspecified abdominal pain: Secondary | ICD-10-CM | POA: Insufficient documentation

## 2012-05-01 LAB — CBC
HCT: 40.3 % (ref 36.0–46.0)
MCH: 27.9 pg (ref 26.0–34.0)
MCV: 79.2 fL (ref 78.0–100.0)
Platelets: 221 10*3/uL (ref 150–400)
RBC: 5.09 MIL/uL (ref 3.87–5.11)
WBC: 10.9 10*3/uL — ABNORMAL HIGH (ref 4.0–10.5)

## 2012-05-01 LAB — COMPREHENSIVE METABOLIC PANEL
BUN: 8 mg/dL (ref 6–23)
CO2: 25 mEq/L (ref 19–32)
Calcium: 9.8 mg/dL (ref 8.4–10.5)
Chloride: 101 mEq/L (ref 96–112)
Creatinine, Ser: 0.52 mg/dL (ref 0.50–1.10)
GFR calc Af Amer: >90 mL/min (ref >90)
GFR calc non Af Amer: >90 mL/min (ref >90)
Total Bilirubin: 0.2 mg/dL — ABNORMAL LOW (ref 0.3–1.2)

## 2012-05-01 LAB — ACETAMINOPHEN LEVEL: Acetaminophen (Tylenol), Serum: <15 ug/mL (ref 10–30)

## 2012-05-01 LAB — ETHANOL: Alcohol, Ethyl (B): <11 mg/dL (ref 0–11)

## 2012-05-01 LAB — SALICYLATE LEVEL: Salicylate Lvl: <2 mg/dL — ABNORMAL LOW (ref 2.8–20.0)

## 2012-05-01 LAB — RAPID URINE DRUG SCREEN, HOSP PERFORMED: Barbiturates: NOT DETECTED

## 2012-05-01 LAB — POCT PREGNANCY, URINE: Preg Test, Ur: NEGATIVE

## 2012-05-01 MED ORDER — QUETIAPINE FUMARATE ER 50 MG PO TB24
650.0000 mg | ORAL_TABLET | Freq: Every day | ORAL | Status: DC
  Administered 2012-05-01: 650 mg via ORAL
  Filled 2012-05-01 (×3): qty 1

## 2012-05-01 MED ORDER — ONDANSETRON HCL 8 MG PO TABS: 4.0000 mg | ORAL_TABLET | Freq: Three times a day (TID) | ORAL | Status: DC | PRN

## 2012-05-01 MED ORDER — ZOLPIDEM TARTRATE 5 MG PO TABS
5.0000 mg | ORAL_TABLET | Freq: Every evening | ORAL | Status: DC | PRN
  Administered 2012-05-01: 5 mg via ORAL
  Filled 2012-05-01: qty 1

## 2012-05-01 MED ORDER — DIPHENHYDRAMINE HCL 25 MG PO CAPS
25.0000 mg | ORAL_CAPSULE | Freq: Once | ORAL | Status: AC
  Administered 2012-05-01: 25 mg via ORAL
  Filled 2012-05-01: qty 1

## 2012-05-01 MED ORDER — DIPHENHYDRAMINE HCL 50 MG/ML IJ SOLN: 25.0000 mg | Freq: Once | INTRAMUSCULAR | Status: DC

## 2012-05-01 MED ORDER — HYDROMORPHONE HCL 2 MG PO TABS
2.0000 mg | ORAL_TABLET | ORAL | Status: DC | PRN
  Administered 2012-05-01 – 2012-05-02 (×6): 2 mg via ORAL
  Filled 2012-05-01 (×6): qty 1

## 2012-05-01 MED ORDER — LORAZEPAM 1 MG PO TABS: 1.0000 mg | ORAL_TABLET | Freq: Three times a day (TID) | ORAL | Status: DC | PRN

## 2012-05-01 MED ORDER — QUETIAPINE FUMARATE 25 MG PO TABS: 50.0000 mg | ORAL_TABLET | Freq: Every day | ORAL | Status: DC

## 2012-05-01 MED ORDER — QUETIAPINE FUMARATE ER 50 MG PO TB24: 50.0000 mg | ORAL_TABLET | Freq: Every day | ORAL | Status: DC

## 2012-05-01 MED ORDER — GABAPENTIN 300 MG PO CAPS
300.0000 mg | ORAL_CAPSULE | Freq: Four times a day (QID) | ORAL | Status: DC
  Administered 2012-05-01 – 2012-05-02 (×5): 300 mg via ORAL
  Filled 2012-05-01 (×5): qty 1

## 2012-05-01 MED ORDER — QUETIAPINE FUMARATE ER 300 MG PO TB24
600.0000 mg | ORAL_TABLET | Freq: Every day | ORAL | Status: DC
  Filled 2012-05-01: qty 2

## 2012-05-01 MED ORDER — SERTRALINE HCL 50 MG PO TABS
100.0000 mg | ORAL_TABLET | Freq: Every day | ORAL | Status: DC
  Administered 2012-05-01: 100 mg via ORAL
  Filled 2012-05-01 (×2): qty 1

## 2012-05-01 NOTE — ED Notes (Signed)
House coverage called for sitter. Pt placed in paper scrubs and security called to wand pt.

## 2012-05-01 NOTE — ED Notes (Signed)
Pt itchy and rash noted to forearms. PA notified.

## 2012-05-01 NOTE — ED Provider Notes (Signed)
History    This chart was scribed for non-physician practitioner Arthor Captain, PA-C working with Joya Gaskins, MD by Gerlean Ren, ED Scribe. This patient was seen in room TR11C/TR11C and the patient's care was started at 6:48 PM.   CSN: [id number]  Arrival date & time 05/01/12  1634   First MD Initiated Contact with Patient 05/01/12 1701      Chief Complaint  Patient presents with  . Suicidal    The history is provided by the patient. No language interpreter was used.  Tina Saunders is a 27 y.o. female who presents to the Emergency Department complaining of 4 days of worsening depression causing suicidal ideations.  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  Pt denies having an active plan.  Pt has access to knives at home.  Pt denies HI.  Pt reports visual hallucination of "a man that just stands there and watches me but does not speak."  Pt reports she has been having this hallucination since diagnosis in 03/2011.  Pt has been hospitalized for depression here 7-8 months ago.  Pt reports previous suicide attempts by cutting wrists.  Pt has h/o HTN,      Past Medical History  Diagnosis Date  . Polycystic ovarian syndrome 07/01/2011    Patient report  . Anxiety   . Depression   . Cancer of abdominal wall   . Rhabdosarcoma   . Hypertension   . Diabetes mellitus without complication   . Obesity     Past Surgical History  Procedure Laterality Date  . Cholecystectomy    . Varicose vein surgery    . Ovarian cyst excision      Family History  Problem Relation Age of Onset  . Coronary artery disease Maternal Grandmother   . Diabetes type II Maternal Grandmother   . Cancer Maternal Grandmother   . Hypertension Mother   . Hypertension Father     History  Substance Use Topics  . Smoking status: Never Smoker   . Smokeless tobacco: Never Used  . Alcohol Use: No    OB History   Grav  Para Term Preterm Abortions TAB SAB Ect Mult Living   0               Review of Systems  Constitutional: Negative for fever and chills.       Severe generalized pain  HENT: Negative for trouble swallowing.   Respiratory: Negative for shortness of breath.   Cardiovascular: Negative for chest pain.  Gastrointestinal: Positive for nausea (chronic), vomiting and abdominal pain.  Genitourinary: Negative for dysuria and hematuria.  Musculoskeletal: Positive for myalgias and arthralgias.  Skin: Negative for rash.  Neurological: Negative for numbness.  Psychiatric/Behavioral: Positive for suicidal ideas and hallucinations. Negative for self-injury.       Depression  All other systems reviewed and are negative.    Allergies  Morphine and related  Home Medications   Current Outpatient Rx  Name  Route  Sig  Dispense  Refill  . albuterol (PROVENTIL HFA;VENTOLIN HFA) 108 (90 BASE) MCG/ACT inhaler   Inhalation   Inhale 2 puffs into the lungs every 4 (four) hours as needed for wheezing.   1 Inhaler   0   . Catheters KIT      Use in and out cath PRN for bladder emptying   10 each   0   . gabapentin (NEURONTIN) 300 MG capsule  Oral   Take 300 mg by mouth 4 (four) times daily. For anxiety/pain         . HYDROcodone-homatropine (HYCODAN) 5-1.5 MG/5ML syrup   Oral   Take 5 mLs by mouth every 6 (six) hours as needed for cough.   120 mL   0   . hydrOXYzine (ATARAX/VISTARIL) 50 MG tablet   Oral   Take 100 mg by mouth every 6 (six) hours as needed for anxiety.          . naproxen (NAPROSYN) 500 MG tablet   Oral   Take 1 tablet (500 mg total) by mouth 2 (two) times daily. For pain management   20 tablet   0   . ondansetron (ZOFRAN) 4 MG tablet   Oral   Take 1 tablet (4 mg total) by mouth every 6 (six) hours.   12 tablet   0   . oxyCODONE-acetaminophen (PERCOCET/ROXICET) 5-325 MG per tablet   Oral   Take 2 tablets by mouth every 4 (four) hours as needed for pain.   15  tablet   0   . QUEtiapine (SEROQUEL XR) 300 MG 24 hr tablet   Oral   Take 600 mg by mouth at bedtime. Take with 50mg  tablets also         . QUEtiapine (SEROQUEL XR) 50 MG TB24   Oral   Take 50 mg by mouth at bedtime. Takes with 300mg  tablets  also         . sertraline (ZOLOFT) 100 MG tablet   Oral   Take 150 mg by mouth every evening. For depression           BP 147/113  Pulse 115  Temp(Src) 98.3 F (36.8 C)  Resp 18  SpO2 95%  LMP 04/16/2012  Physical Exam  Nursing note and vitals reviewed. Constitutional: She is oriented to person, place, and time. She appears well-developed and well-nourished. No distress.  Morbidly obese  HENT:  Head: Normocephalic and atraumatic.  Eyes: Conjunctivae and EOM are normal. No scleral icterus.  Neck: Normal range of motion. Neck supple. No tracheal deviation present.  Cardiovascular: Normal rate, regular rhythm and normal heart sounds.  Exam reveals no gallop and no friction rub.   No murmur heard. Pulmonary/Chest: Effort normal and breath sounds normal. No respiratory distress.  Abdominal: Soft. Bowel sounds are normal. She exhibits no distension and no mass. There is no tenderness. There is no guarding.  Musculoskeletal: Normal range of motion. She exhibits tenderness.  Neurological: She is alert and oriented to person, place, and time.  Skin: Skin is warm and dry. She is not diaphoretic.  Psychiatric: She has a normal mood and affect. Her behavior is normal.    ED Course  Procedures (including critical care time) DIAGNOSTIC STUDIES: Oxygen Saturation is 95% on room air, adequate by my interpretation.    COORDINATION OF CARE: 6:57 PM- Informed pt that I will perform medical clearance and that she will have telepsych evaluation to determine further placement.  Pt requests pain medicine and I agree.  Pt agrees with plan.      Results for orders placed during the hospital encounter of 05/01/12  CBC      Result Value Range    WBC 10.9 (*) 4.0 - 10.5 K/uL   RBC 5.09  3.87 - 5.11 MIL/uL   Hemoglobin 14.2  12.0 - 15.0 g/dL   HCT 45.4  09.8 - 11.9 %   MCV 79.2  78.0 - 100.0 fL  MCH 27.9  26.0 - 34.0 pg   MCHC 35.2  30.0 - 36.0 g/dL   RDW 40.9  81.1 - 91.4 %   Platelets 221  150 - 400 K/uL  COMPREHENSIVE METABOLIC PANEL      Result Value Range   Sodium 139  135 - 145 mEq/L   Potassium 3.7  3.5 - 5.1 mEq/L   Chloride 101  96 - 112 mEq/L   CO2 25  19 - 32 mEq/L   Glucose, Bld 115 (*) 70 - 99 mg/dL   BUN 8  6 - 23 mg/dL   Creatinine, Ser 7.82  0.50 - 1.10 mg/dL   Calcium 9.8  8.4 - 95.6 mg/dL   Total Protein 8.0  6.0 - 8.3 g/dL   Albumin 3.8  3.5 - 5.2 g/dL   AST 72 (*) 0 - 37 U/L   ALT 63 (*) 0 - 35 U/L   Alkaline Phosphatase 102  39 - 117 U/L   Total Bilirubin 0.2 (*) 0.3 - 1.2 mg/dL   GFR calc non Af Amer >90  >90 mL/min   GFR calc Af Amer >90  >90 mL/min  ETHANOL      Result Value Range   Alcohol, Ethyl (B) <11  0 - 11 mg/dL  ACETAMINOPHEN LEVEL      Result Value Range   Acetaminophen (Tylenol), Serum <15.0  10 - 30 ug/mL  SALICYLATE LEVEL      Result Value Range   Salicylate Lvl <2.0 (*) 2.8 - 20.0 mg/dL  URINE RAPID DRUG SCREEN (HOSP PERFORMED)      Result Value Range   Opiates NONE DETECTED  NONE DETECTED   Cocaine NONE DETECTED  NONE DETECTED   Benzodiazepines NONE DETECTED  NONE DETECTED   Amphetamines NONE DETECTED  NONE DETECTED   Tetrahydrocannabinol NONE DETECTED  NONE DETECTED   Barbiturates NONE DETECTED  NONE DETECTED  POCT PREGNANCY, URINE      Result Value Range   Preg Test, Ur NEGATIVE  NEGATIVE    No results found.   No diagnosis found.    MDM  Patient with SI/AVH,. No HI. Requesting pain control. Appears medically sound for psych evaluation.    I personally performed the services described in this documentation, which was scribed in my presence. The recorded information has been reviewed and is accurate.     Arthor Captain, PA-C 05/05/12 1757

## 2012-05-01 NOTE — ED Notes (Signed)
Per pt sts suicidal thoughts and depression for a few days. sts she was looking up online how to kill herself. sts hx of the same. sts that chemo killed her bladder function and she is hopeless because she has to wear a diaper.

## 2012-05-01 NOTE — ED Notes (Signed)
PT CAN COME TO POD C 21 WHEN MEDICALLY CLEARED

## 2012-05-02 NOTE — ED Notes (Signed)
Patient diaper changed per her request. She has finished dinner. Pt ready for transport to [facility]. Via [facility]

## 2012-05-02 NOTE — BH Assessment (Signed)
Assessment Note  Tina Saunders is a 27 year old female who presents to the Emergency Department complaining of 4 days of worsening depression causing suicidal ideations.  Patient reports that she was looking on the internet for ways to kill herself.  Patient reports a past history of cutting herself.  Patient reports that the last time she cut herself was in 2013 when she was hospitalized at [facility].  Patient reports that, "she does not want to live".    Patient reports a prior history of mental illness. Patient reports that she has been previously diagnosed with Schizophrenia and Bipolar when she was 27 years old.  Patient reports visual hallucination of "a man that just stands there and watches me but does not speak." Patient reports a family history of mental illness.  Patient reports that she receives therapy and medication management from [facility].   Patient reports that her therapist is Vincent Gros).  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  Patient reports that she is in global chronic pain and has bladder incontinence associated with current chemotherapy for cancer.   Patient denies HI. Patient denies auditory hallucinations.  Patient denies substance abuse.  Patients UDS was negative.  Patients BAL was <11.  Patient denies a family history of substance abuse.    Axis I: Schizoaffective Disorder Axis II: Deferred Axis III:  Past Medical History  Diagnosis Date  . Polycystic ovarian syndrome 07/01/2011    Patient report  . Anxiety   . Depression   . Cancer of abdominal wall   . Rhabdosarcoma   . Hypertension   . Diabetes mellitus without complication   . Obesity    Axis IV: economic problems, occupational problems, other psychosocial or environmental problems, problems related to legal system/crime, problems related to social environment and problems with access to health care services Axis V: 11-20 some danger of hurting self or others  possible OR occasionally fails to maintain minimal personal hygiene OR gross impairment in communication  Past Medical History:  Past Medical History  Diagnosis Date  . Polycystic ovarian syndrome 07/01/2011    Patient report  . Anxiety   . Depression   . Cancer of abdominal wall   . Rhabdosarcoma   . Hypertension   . Diabetes mellitus without complication   . Obesity     Past Surgical History  Procedure Laterality Date  . Cholecystectomy    . Varicose vein surgery    . Ovarian cyst excision      Family History:  Family History  Problem Relation Age of Onset  . Coronary artery disease Maternal Grandmother   . Diabetes type II Maternal Grandmother   . Cancer Maternal Grandmother   . Hypertension Mother   . Hypertension Father     Social History:  reports that she has never smoked. She has never used smokeless tobacco. She reports that she does not drink alcohol or use illicit drugs.  Additional Social History:     CIWA: CIWA-Ar BP: 147/113 mmHg Pulse Rate: 115 COWS:    Allergies:  Allergies  Allergen Reactions  . Morphine And Related Hives, Itching and Other (See Comments)    GI upset    Home Medications:  (Not in a hospital admission)  OB/GYN Status:  Patient's last menstrual period was 04/16/2012.  General Assessment Data Location of Assessment: [facility] ED ACT Assessment: Yes Living Arrangements: Other relatives Can pt return to current living arrangement?: Yes Admission Status: Voluntary Is patient  capable of signing voluntary admission?: Yes Transfer from: Acute Hospital Referral Source: Self/Family/Friend     Risk to self Suicidal Ideation: Yes-Currently Present Suicidal Intent: Yes-Currently Present Is patient at risk for suicide?: Yes Suicidal Plan?: Yes-Currently Present Specify Current Suicidal Plan: cutting her wrist Access to Means: Yes Specify Access to Suicidal Means: any sharp object. What has been your use of drugs/alcohol within the  last 12 months?: none reported Previous Attempts/Gestures: Yes How many times?: 1 Other Self Harm Risks:  (None Reported) Triggers for Past Attempts: Family contact;Hallucinations Intentional Self Injurious Behavior: Cutting Comment - Self Injurious Behavior: cutting her wrist in an attempt to kill herself.  Patient has not cut herself since 2013. Family Suicide History: Yes Recent stressful life event(s): Conflict (Comment);Job Loss;Financial Problems;Recent negative physical changes;Other (Comment) Persecutory voices/beliefs?: No Depression: Yes Depression Symptoms: Despondent;Insomnia;Tearfulness;Isolating;Fatigue;Guilt;Loss of interest in usual pleasures;Feeling worthless/self pity;Feeling angry/irritable Substance abuse history and/or treatment for substance abuse?: No Suicide prevention information given to non-admitted patients: Yes  Risk to Others Homicidal Ideation: No Thoughts of Harm to Others: No Current Homicidal Intent: No Current Homicidal Plan: No Access to Homicidal Means: No Identified Victim: none History of harm to others?: No Assessment of Violence: None Noted Violent Behavior Description: none reported Does patient have access to weapons?: No Criminal Charges Pending?: No Does patient have a court date: Yes Court Date: 05/17/12  Psychosis Hallucinations: Visual Delusions: None noted  Mental Status Report Appear/Hygiene: Disheveled Eye Contact: Poor Motor Activity: Unremarkable Speech: Logical/coherent Level of Consciousness: Alert Mood: Depressed Affect: Depressed Anxiety Level: None Thought Processes: Relevant Judgement: Unimpaired Orientation: Person;Place;Situation Obsessive Compulsive Thoughts/Behaviors: None  Cognitive Functioning Concentration: Decreased Memory: Recent Intact;Remote Intact IQ: Average Insight: Poor Impulse Control: Poor Appetite: Fair Weight Loss: 0 Weight Gain: 0 Sleep: Decreased Total Hours of Sleep:  5 Vegetative Symptoms: None  ADLScreening [facility] Assessment Services) Patient's cognitive ability adequate to safely complete daily activities?: Yes Patient able to express need for assistance with ADLs?: Yes Independently performs ADLs?: Yes (appropriate for developmental age)  Abuse/Neglect [facility]) Physical Abuse: Denies Verbal Abuse: Denies Sexual Abuse: Denies  Prior Inpatient Therapy Prior Inpatient Therapy: Yes Prior Therapy Dates: 2013 Prior Therapy Facilty/Provider(s): [facility] Reason for Treatment: SI, cutting  Prior Outpatient Therapy Prior Outpatient Therapy: Yes Prior Therapy Dates: ongoing Prior Therapy Facilty/Provider(s): [facility]  Reason for Treatment: Depression and medication management   ADL Screening (condition at time of admission) Patient's cognitive ability adequate to safely complete daily activities?: Yes Patient able to express need for assistance with ADLs?: Yes Independently performs ADLs?: Yes (appropriate for developmental age)       Abuse/Neglect Assessment (Assessment to be complete while patient is alone) Physical Abuse: Denies Verbal Abuse: Denies Sexual Abuse: Denies          Additional Information 1:1 In Past 12 Months?: No CIRT Risk: No Elopement Risk: No Does patient have medical clearance?: Yes     Disposition: Patient pending placement at [facility]   Disposition Initial Assessment Completed for this Encounter: Yes Disposition of Patient: Referred to Patient referred to: Other (Comment)  On Site Evaluation by:   Reviewed with Physician:     Phillip Heal LaVerne 05/02/2012 2:31 AM

## 2012-05-02 NOTE — ED Provider Notes (Signed)
During morning rounds the patient affirms for suicide intent. Vital signs are stable.  Placement pending.  Gerhard Munch, MD 05/02/12 1137

## 2012-05-02 NOTE — BH Assessment (Signed)
BHH Assessment Progress Note      Update:  Pt accepted to [facility] to Dr. Deneen Harts per Agustin Cree, RN, CSM @ [phone] to Room 2560.  Pt to be transported via [place], as pt under IVC.  Sheriff to call [phone], go to main admitting and nurse to call [phone] with report.  [facility] and left message to transport pt.  Oncoming staff will need to notifiy EDP and ED staff, as well as call Berton Lan when pt en route at [phone].

## 2012-05-02 NOTE — BHH Counselor (Signed)
Writer contacted [facility] and was informed that they do have 1 opening and possibly 2 by the morning.  Therefore, Clinical research associate faxed the assessment and supporting documentation.    Writer contacted [facility] and was informed that they may have an opening in the morning but did not want the assessment or supporting documentation until after 8:00am.

## 2012-05-02 NOTE — ED Notes (Signed)
Pt. Ambulated to shower, scrubs changed.

## 2012-05-02 NOTE — ED Notes (Signed)
Pt offered to shower, Pt states she hurts all over and needs help ambulating to shower.

## 2012-05-02 NOTE — BH Assessment (Signed)
Assessment Note  Update:  Pt accepted to [facility] to Dr. Deneen Harts and [facility] here to transport pt.  EDP [place] and ED staff updated.  Pt to be discharged from [facility].  ED staff to notify Berton Lan that pt is en route.    Disposition:  Disposition Initial Assessment Completed for this Encounter: Yes Disposition of Patient: Inpatient treatment program Type of inpatient treatment program: Adult Patient referred to: Other (Comment) (Pt accepted Berton Lan)  On Site Evaluation by:   Reviewed with Physician:  Thornton Papas, Rennis Harding 05/02/2012 5:54 PM

## 2012-05-06 NOTE — ED Provider Notes (Signed)
Medical screening examination/treatment/procedure(s) were performed by non-physician practitioner and as supervising physician I was immediately available for consultation/collaboration.   Joya Gaskins, MD 05/06/12 [phone]

## 2012-05-07 ENCOUNTER — Emergency Department (HOSPITAL_COMMUNITY)
Admission: EM | Admit: 2012-05-07 | Discharge: 2012-05-07 | Disposition: A | Discharge status: Home / Self Care | Payer: Self-pay | Attending: Emergency Medicine | Admitting: Emergency Medicine

## 2012-05-07 ENCOUNTER — Encounter (HOSPITAL_COMMUNITY): Payer: Self-pay | Admitting: Emergency Medicine

## 2012-05-07 DIAGNOSIS — R109 Unspecified abdominal pain: Secondary | ICD-10-CM

## 2012-05-07 DIAGNOSIS — F329 Major depressive disorder, single episode, unspecified: Secondary | ICD-10-CM | POA: Insufficient documentation

## 2012-05-07 DIAGNOSIS — E669 Obesity, unspecified: Secondary | ICD-10-CM | POA: Insufficient documentation

## 2012-05-07 DIAGNOSIS — R197 Diarrhea, unspecified: Secondary | ICD-10-CM

## 2012-05-07 DIAGNOSIS — Z8739 Personal history of other diseases of the musculoskeletal system and connective tissue: Secondary | ICD-10-CM | POA: Insufficient documentation

## 2012-05-07 DIAGNOSIS — Z3202 Encounter for pregnancy test, result negative: Secondary | ICD-10-CM | POA: Insufficient documentation

## 2012-05-07 DIAGNOSIS — R110 Nausea: Secondary | ICD-10-CM

## 2012-05-07 DIAGNOSIS — I10 Essential (primary) hypertension: Secondary | ICD-10-CM | POA: Insufficient documentation

## 2012-05-07 DIAGNOSIS — Z9089 Acquired absence of other organs: Secondary | ICD-10-CM | POA: Insufficient documentation

## 2012-05-07 DIAGNOSIS — Z8509 Personal history of malignant neoplasm of other digestive organs: Secondary | ICD-10-CM | POA: Insufficient documentation

## 2012-05-07 DIAGNOSIS — N390 Urinary tract infection, site not specified: Secondary | ICD-10-CM

## 2012-05-07 DIAGNOSIS — Z8742 Personal history of other diseases of the female genital tract: Secondary | ICD-10-CM | POA: Insufficient documentation

## 2012-05-07 DIAGNOSIS — Z79899 Other long term (current) drug therapy: Secondary | ICD-10-CM | POA: Insufficient documentation

## 2012-05-07 DIAGNOSIS — F3289 Other specified depressive episodes: Secondary | ICD-10-CM | POA: Insufficient documentation

## 2012-05-07 DIAGNOSIS — R52 Pain, unspecified: Secondary | ICD-10-CM | POA: Insufficient documentation

## 2012-05-07 DIAGNOSIS — E119 Type 2 diabetes mellitus without complications: Secondary | ICD-10-CM | POA: Insufficient documentation

## 2012-05-07 DIAGNOSIS — F411 Generalized anxiety disorder: Secondary | ICD-10-CM | POA: Insufficient documentation

## 2012-05-07 DIAGNOSIS — R51 Headache: Secondary | ICD-10-CM

## 2012-05-07 LAB — CBC WITH DIFFERENTIAL/PLATELET
Basophils Absolute: 0 10*3/uL (ref 0.0–0.1)
Basophils Relative: 0 % (ref 0–1)
Eosinophils Absolute: 0.3 10*3/uL (ref 0.0–0.7)
Eosinophils Relative: 5 % (ref 0–5)
HCT: 36.7 % (ref 36.0–46.0)
Hemoglobin: 12.5 g/dL (ref 12.0–15.0)
Lymphocytes Relative: 24 % (ref 12–46)
Lymphs Abs: 1.6 10*3/uL (ref 0.7–4.0)
MCH: 27.4 pg (ref 26.0–34.0)
MCHC: 34.1 g/dL (ref 30.0–36.0)
MCV: 80.3 fL (ref 78.0–100.0)
Monocytes Absolute: 0.4 10*3/uL (ref 0.1–1.0)
Monocytes Relative: 5 % (ref 3–12)
Neutro Abs: 4.4 10*3/uL (ref 1.7–7.7)
Neutrophils Relative %: 66 % (ref 43–77)
Platelets: 188 10*3/uL (ref 150–400)
RBC: 4.57 MIL/uL (ref 3.87–5.11)
RDW: 14.4 % (ref 11.5–15.5)
WBC: 6.7 10*3/uL (ref 4.0–10.5)

## 2012-05-07 LAB — BASIC METABOLIC PANEL
BUN: 8 mg/dL (ref 6–23)
CO2: 26 mEq/L (ref 19–32)
Calcium: 9 mg/dL (ref 8.4–10.5)
Chloride: 103 mEq/L (ref 96–112)
Creatinine, Ser: 0.47 mg/dL — ABNORMAL LOW (ref 0.50–1.10)
GFR calc Af Amer: >90 mL/min (ref >90)
GFR calc non Af Amer: >90 mL/min (ref >90)
Glucose, Bld: 145 mg/dL — ABNORMAL HIGH (ref 70–99)
Potassium: 3.5 mEq/L (ref 3.5–5.1)
Sodium: 139 mEq/L (ref 135–145)

## 2012-05-07 LAB — URINALYSIS, ROUTINE W REFLEX MICROSCOPIC
Bilirubin Urine: NEGATIVE
Glucose, UA: NEGATIVE mg/dL
Hgb urine dipstick: NEGATIVE
Ketones, ur: NEGATIVE mg/dL
Nitrite: NEGATIVE
Protein, ur: NEGATIVE mg/dL
Specific Gravity, Urine: 1.028 (ref 1.005–1.030)
Urobilinogen, UA: 0.2 mg/dL (ref 0.0–1.0)
pH: 5.5 (ref 5.0–8.0)

## 2012-05-07 LAB — URINE MICROSCOPIC-ADD ON

## 2012-05-07 LAB — PREGNANCY, URINE: Preg Test, Ur: NEGATIVE

## 2012-05-07 MED ORDER — SODIUM CHLORIDE 0.9 % IV BOLUS (SEPSIS)
1000.0000 mL | Freq: Once | INTRAVENOUS | Status: AC
  Administered 2012-05-07: 1000 mL via INTRAVENOUS

## 2012-05-07 MED ORDER — HYDROMORPHONE HCL PF 1 MG/ML IJ SOLN
1.0000 mg | Freq: Once | INTRAMUSCULAR | Status: AC
  Administered 2012-05-07: 1 mg via INTRAVENOUS
  Filled 2012-05-07: qty 1

## 2012-05-07 MED ORDER — ONDANSETRON HCL 4 MG/2ML IJ SOLN
4.0000 mg | Freq: Once | INTRAMUSCULAR | Status: AC
  Administered 2012-05-07: 4 mg via INTRAVENOUS
  Filled 2012-05-07: qty 2

## 2012-05-07 MED ORDER — NITROFURANTOIN MONOHYD MACRO 100 MG PO CAPS: 100.0000 mg | ORAL_CAPSULE | Freq: Two times a day (BID) | ORAL | Status: DC

## 2012-05-07 NOTE — ED Provider Notes (Signed)
History     CSN: [id number]  Arrival date & time 05/07/12  1021   First MD Initiated Contact with Patient 05/07/12 1114      Chief Complaint  Patient presents with  . Headache    (Consider location/radiation/quality/duration/timing/severity/associated sxs/prior treatment) Patient is a 27 y.o. female presenting with headaches. The history is provided by the patient. No language interpreter was used.  Headache Associated symptoms: abdominal pain, diarrhea and nausea   Associated symptoms: no cough, no fever and no vomiting   Pt is a 27yo female with a hx of rhabdosarcoma, PCOS, and cancer of abdominal wall presenting with an aching headache at the top of her head for past 2 days.  Pt states she frequently gets similar headaches. Pt also c/o abdominal pain and generalized body aches.  Abdominal pain is sharp in nature but same as normal just a little worse today with some nausea.  Has tried oxycodone at home with some relief.  Denies vomiting or blood in stool. Denies fever or dysuria.  Pt states unable to feel when she urinates.    Past Medical History  Diagnosis Date  . Polycystic ovarian syndrome 07/01/2011    Patient report  . Anxiety   . Depression   . Cancer of abdominal wall   . Rhabdosarcoma   . Hypertension   . Diabetes mellitus without complication   . Obesity     Past Surgical History  Procedure Laterality Date  . Cholecystectomy    . Varicose vein surgery    . Ovarian cyst excision      Family History  Problem Relation Age of Onset  . Coronary artery disease Maternal Grandmother   . Diabetes type II Maternal Grandmother   . Cancer Maternal Grandmother   . Hypertension Mother   . Hypertension Father     History  Substance Use Topics  . Smoking status: Never Smoker   . Smokeless tobacco: Never Used  . Alcohol Use: No    OB History   Grav Para Term Preterm Abortions TAB SAB Ect Mult Living   0               Review of Systems  Constitutional:  Negative for fever and chills.  Respiratory: Negative for cough and shortness of breath.   Cardiovascular: Negative for chest pain.  Gastrointestinal: Positive for nausea, abdominal pain and diarrhea. Negative for vomiting.  Genitourinary: Negative for dysuria and flank pain.  Neurological: Positive for headaches.    Allergies  Morphine and related  Home Medications   Current Outpatient Rx  Name  Route  Sig  Dispense  Refill  . albuterol (PROVENTIL HFA;VENTOLIN HFA) 108 (90 BASE) MCG/ACT inhaler   Inhalation   Inhale 2 puffs into the lungs every 4 (four) hours as needed for wheezing or shortness of breath.         . gabapentin (NEURONTIN) 800 MG tablet   Oral   Take 800 mg by mouth at bedtime.         . hydrOXYzine (ATARAX/VISTARIL) 50 MG tablet   Oral   Take 100 mg by mouth every 6 (six) hours as needed for anxiety.          . naproxen (NAPROSYN) 500 MG tablet   Oral   Take 1 tablet (500 mg total) by mouth 2 (two) times daily. For pain management   20 tablet   0   . ondansetron (ZOFRAN) 4 MG tablet   Oral   Take 4 mg by  mouth every 6 (six) hours.         Marland Kitchen oxyCODONE-acetaminophen (PERCOCET/ROXICET) 5-325 MG per tablet   Oral   Take 2 tablets by mouth every 4 (four) hours as needed for pain.   15 tablet   0   . QUEtiapine (SEROQUEL XR) 400 MG 24 hr tablet   Oral   Take 800 mg by mouth at bedtime.         . sertraline (ZOLOFT) 100 MG tablet   Oral   Take 150 mg by mouth at bedtime.          . nitrofurantoin, macrocrystal-monohydrate, (MACROBID) 100 MG capsule   Oral   Take 1 capsule (100 mg total) by mouth 2 (two) times daily.   10 capsule   0     BP 124/59  Pulse 93  Temp(Src) 98.6 F (37 C) (Oral)  Resp 23  SpO2 97%  LMP 04/16/2012  Physical Exam  Nursing note and vitals reviewed. Constitutional: She appears well-developed and well-nourished. No distress.  HENT:  Head: Normocephalic and atraumatic.  Eyes: Conjunctivae and EOM are  normal. Pupils are equal, round, and reactive to light. Right eye exhibits no discharge. Left eye exhibits no discharge. No scleral icterus.  Neck: Normal range of motion. Neck supple.  Cardiovascular: Regular rhythm and normal heart sounds.   Tachycardic   Pulmonary/Chest: Effort normal and breath sounds normal. No respiratory distress. She has no wheezes. She has no rales. She exhibits no tenderness.  Distant lower lobe lung sounds   Abdominal: Soft. Bowel sounds are normal. She exhibits no distension and no mass. There is tenderness ( TTP throughout, worse in RLQ, just below umbilicous, and LLQ). There is no rebound and no guarding.  Musculoskeletal: Normal range of motion.  Neurological: She is alert.  Skin: Skin is warm and dry. She is not diaphoretic.  Psychiatric: She has a normal mood and affect. Her behavior is normal. Judgment and thought content normal.    ED Course  Procedures (including critical care time)  Labs Reviewed  URINALYSIS, ROUTINE W REFLEX MICROSCOPIC - Abnormal; Notable for the following:    APPearance CLOUDY (*)    Leukocytes, UA MODERATE (*)    All other components within normal limits  BASIC METABOLIC PANEL - Abnormal; Notable for the following:    Glucose, Bld 145 (*)    Creatinine, Ser 0.47 (*)    All other components within normal limits  URINE MICROSCOPIC-ADD ON - Abnormal; Notable for the following:    Squamous Epithelial / LPF MANY (*)    Bacteria, UA MANY (*)    All other components within normal limits  URINE CULTURE  PREGNANCY, URINE  CBC WITH DIFFERENTIAL   No results found.   1. Headache   2. Nausea   3. Diarrhea   4. Abdominal pain   5. UTI (lower urinary tract infection)       MDM  Pt is a 27yo female with rhabdosarcoma, PCOS, and cancer of abdominal wall c/o frequent headaches similar to today's as well as constant abdominal pain due to her cancer but today is worse.  Pt also c/o nausea and diarrhea.    Pt was tachycardic  upon arrival but improved after receiving fluids, dilaudid, and Zofran.   Labs: unremarkable  Discussed pt with Dr. Ethelda Chick.  1:52 PM Pt states headache and stomach pain are much better and she is no longer nauseous.   Advised pt we may be able to discharge her home soon.  She is calling family friend to come pick her up.  Will have pt f/u with PCP and oncologist for chronic pain, nausea, and diarrhea.    UA: indicative of UTI.  Nurses stated they did do I&O cath for specimen.  Pt is unable to feel when she pees so not c/o any urinary symptoms.  With tx empirically and have urine cultured.   Rx: macrobid  Discussed importance of pt to f/u with oncologist and PCP for chronic symptoms.  Pt was unable to recall either providers name or the name of the practices.  Only stated here PCP was in a different county.  Pt verbalized she will f/u with her PCP but I provided pt with resource guide as well.    Vitals: unremarkable. Discharged in stable condition.    Discussed pt with attending during ED encounter.          Junius Finner, PA-C 05/07/12 1352

## 2012-05-07 NOTE — ED Notes (Signed)
H/a and body aches  And stomach pain x 2 days nausea  No dysuria

## 2012-05-07 NOTE — ED Provider Notes (Signed)
Plains of diffuse headache typical of headaches she gets daily for several months also complains of diffuse abdominal pain for several months, a little bit worse this morning. No fever admits to nausea and diarrhea she had 9 episodes of diarrhea 2 days ago with 4 episodes of diarrhea today. On exam alert nontoxic Glasgow Coma Score 15 HEENT exam no facial asymmetry years normal eyes normal neck supple no signs of meningitis lungs clear auscultation heart mildly tachycardic regular rhythm 108 beats per minute abdomen obese nontender normoactive bowel sound  Doug Sou, MD 05/07/12 1151

## 2012-05-07 NOTE — ED Provider Notes (Signed)
Medical screening examination/treatment/procedure(s) were conducted as a shared visit with non-physician practitioner(s) and myself.  I personally evaluated the patient during the encounter  Doug Sou, MD 05/07/12 2054

## 2012-05-08 LAB — URINE CULTURE: Colony Count: NO GROWTH

## 2012-05-27 ENCOUNTER — Emergency Department (HOSPITAL_COMMUNITY)
Admission: EM | Admit: 2012-05-27 | Discharge: 2012-05-27 | Disposition: A | Discharge status: Home / Self Care | Payer: Self-pay | Attending: Emergency Medicine | Admitting: Emergency Medicine

## 2012-05-27 ENCOUNTER — Encounter (HOSPITAL_COMMUNITY): Payer: Self-pay | Admitting: Cardiology

## 2012-05-27 DIAGNOSIS — C44599 Other specified malignant neoplasm of skin of other part of trunk: Secondary | ICD-10-CM | POA: Insufficient documentation

## 2012-05-27 DIAGNOSIS — F3289 Other specified depressive episodes: Secondary | ICD-10-CM | POA: Insufficient documentation

## 2012-05-27 DIAGNOSIS — R079 Chest pain, unspecified: Secondary | ICD-10-CM | POA: Insufficient documentation

## 2012-05-27 DIAGNOSIS — E119 Type 2 diabetes mellitus without complications: Secondary | ICD-10-CM | POA: Insufficient documentation

## 2012-05-27 DIAGNOSIS — Z79899 Other long term (current) drug therapy: Secondary | ICD-10-CM | POA: Insufficient documentation

## 2012-05-27 DIAGNOSIS — F329 Major depressive disorder, single episode, unspecified: Secondary | ICD-10-CM | POA: Insufficient documentation

## 2012-05-27 DIAGNOSIS — C499 Malignant neoplasm of connective and soft tissue, unspecified: Secondary | ICD-10-CM | POA: Insufficient documentation

## 2012-05-27 DIAGNOSIS — F411 Generalized anxiety disorder: Secondary | ICD-10-CM | POA: Insufficient documentation

## 2012-05-27 DIAGNOSIS — R109 Unspecified abdominal pain: Secondary | ICD-10-CM | POA: Insufficient documentation

## 2012-05-27 DIAGNOSIS — Z3202 Encounter for pregnancy test, result negative: Secondary | ICD-10-CM | POA: Insufficient documentation

## 2012-05-27 DIAGNOSIS — E669 Obesity, unspecified: Secondary | ICD-10-CM | POA: Insufficient documentation

## 2012-05-27 DIAGNOSIS — C44509 Unspecified malignant neoplasm of skin of other part of trunk: Secondary | ICD-10-CM | POA: Insufficient documentation

## 2012-05-27 DIAGNOSIS — I10 Essential (primary) hypertension: Secondary | ICD-10-CM | POA: Insufficient documentation

## 2012-05-27 DIAGNOSIS — Z8742 Personal history of other diseases of the female genital tract: Secondary | ICD-10-CM | POA: Insufficient documentation

## 2012-05-27 LAB — CBC WITH DIFFERENTIAL/PLATELET
Basophils Relative: 1 % (ref 0–1)
Eosinophils Absolute: 0.3 10*3/uL (ref 0.0–0.7)
Eosinophils Relative: 6 % — ABNORMAL HIGH (ref 0–5)
HCT: 36.5 % (ref 36.0–46.0)
Hemoglobin: 12.8 g/dL (ref 12.0–15.0)
Lymphs Abs: 1.7 10*3/uL (ref 0.7–4.0)
MCH: 27.9 pg (ref 26.0–34.0)
MCHC: 35.1 g/dL (ref 30.0–36.0)
MCV: 79.5 fL (ref 78.0–100.0)
Monocytes Absolute: 0.3 10*3/uL (ref 0.1–1.0)
Monocytes Relative: 5 % (ref 3–12)
RBC: 4.59 MIL/uL (ref 3.87–5.11)

## 2012-05-27 LAB — COMPREHENSIVE METABOLIC PANEL
Albumin: 3.4 g/dL — ABNORMAL LOW (ref 3.5–5.2)
Alkaline Phosphatase: 100 U/L (ref 39–117)
BUN: 7 mg/dL (ref 6–23)
Creatinine, Ser: 0.43 mg/dL — ABNORMAL LOW (ref 0.50–1.10)
GFR calc Af Amer: >90 mL/min (ref >90)
Glucose, Bld: 120 mg/dL — ABNORMAL HIGH (ref 70–99)
Total Protein: 7 g/dL (ref 6.0–8.3)

## 2012-05-27 LAB — URINALYSIS, ROUTINE W REFLEX MICROSCOPIC
Bilirubin Urine: NEGATIVE
Ketones, ur: NEGATIVE mg/dL
Nitrite: NEGATIVE
Specific Gravity, Urine: 1.026 (ref 1.005–1.030)
Urobilinogen, UA: 1 mg/dL (ref 0.0–1.0)
pH: 5.5 (ref 5.0–8.0)

## 2012-05-27 LAB — URINE MICROSCOPIC-ADD ON

## 2012-05-27 LAB — POCT I-STAT TROPONIN I

## 2012-05-27 LAB — POCT PREGNANCY, URINE: Preg Test, Ur: NEGATIVE

## 2012-05-27 LAB — LIPASE, BLOOD: Lipase: 32 U/L (ref 11–59)

## 2012-05-27 MED ORDER — SODIUM CHLORIDE 0.9 % IV BOLUS (SEPSIS)
1000.0000 mL | Freq: Once | INTRAVENOUS | Status: AC
  Administered 2012-05-27: 1000 mL via INTRAVENOUS

## 2012-05-27 MED ORDER — ONDANSETRON HCL 4 MG/2ML IJ SOLN
4.0000 mg | Freq: Once | INTRAMUSCULAR | Status: AC
  Administered 2012-05-27: 4 mg via INTRAVENOUS
  Filled 2012-05-27: qty 2

## 2012-05-27 MED ORDER — MORPHINE SULFATE 4 MG/ML IJ SOLN
4.0000 mg | Freq: Once | INTRAMUSCULAR | Status: DC
  Filled 2012-05-27: qty 1

## 2012-05-27 MED ORDER — HYDROMORPHONE HCL PF 1 MG/ML IJ SOLN
1.0000 mg | Freq: Once | INTRAMUSCULAR | Status: AC
  Administered 2012-05-27: 1 mg via INTRAVENOUS
  Filled 2012-05-27: qty 1

## 2012-05-27 NOTE — ED Provider Notes (Signed)
Medical screening examination/treatment/procedure(s) were performed by non-physician practitioner and as supervising physician I was immediately available for consultation/collaboration.    Celene Kras, MD 05/27/12 1310

## 2012-05-27 NOTE — ED Notes (Signed)
Pt alert and mentating appropriately upon d/c. Pt given d/c teaching and follow up care instructions. Pt instructed not to drive. Pt states her ride picking her up outside. Pt verbalizes she will not drive today. Pt ambulatory upon d/c. NAD noted upon d/c. Pt denies need for further questions and verbalizes understanding of d/c teaching. Pt leaving with d/c teaching.

## 2012-05-27 NOTE — ED Provider Notes (Signed)
History     CSN: [id number]  Arrival date & time 05/27/12  0921   First MD Initiated Contact with Patient 05/27/12 1111      Chief Complaint  Patient presents with  . Chest Pain  . Abdominal Pain    (Consider location/radiation/quality/duration/timing/severity/associated sxs/prior treatment) HPI Comments: 27 year old female with a past medical history of rhabdosarcoma, cancer of the abdominal wall, PCOS, hypertension, anxiety, depression and diabetes presents to the emergency department complaining of gradual onset left-sided chest pain and right lower abdominal pain beginning 1 day ago. Patient states later last night after returning home from a cookout where she ate some hot dogs and hamburgers and began to develop right lower sided abdominal pain described as sharp, radiating across her lower abdomen rated 8/10 along with an achy left-sided chest pain worse with palpation and deep inspiration rated 10 out of 10. She tried taking ibuprofen, Tylenol and Percocet without relief of her pain. Admits to associated nausea, vomiting and diarrhea. No fever or chills. She has not been able to keep anything down since the cocoa last night. Last chemotherapy treatment was 5 days ago, the next being in 2 days. She receives treatment from [facility]. Currently on her menstrual cycle.  Patient is a 27 y.o. female presenting with chest pain and abdominal pain. The history is provided by the patient.  Chest Pain Associated symptoms: abdominal pain, nausea and vomiting   Associated symptoms: no back pain, no diaphoresis, no dizziness, no fever, no headache, no palpitations, no shortness of breath and no weakness   Abdominal Pain Associated symptoms: chest pain, diarrhea, nausea and vomiting   Associated symptoms: no chills, no dysuria, no fever, no hematuria, no shortness of breath and no vaginal discharge     Past Medical History  Diagnosis Date  . Polycystic ovarian syndrome  07/01/2011    Patient report  . Anxiety   . Depression   . Cancer of abdominal wall   . Rhabdosarcoma   . Hypertension   . Diabetes mellitus without complication   . Obesity     Past Surgical History  Procedure Laterality Date  . Cholecystectomy    . Varicose vein surgery    . Ovarian cyst excision      Family History  Problem Relation Age of Onset  . Coronary artery disease Maternal Grandmother   . Diabetes type II Maternal Grandmother   . Cancer Maternal Grandmother   . Hypertension Mother   . Hypertension Father     History  Substance Use Topics  . Smoking status: Never Smoker   . Smokeless tobacco: Never Used  . Alcohol Use: No    OB History   Grav Para Term Preterm Abortions TAB SAB Ect Mult Living   0               Review of Systems  Constitutional: Positive for appetite change. Negative for fever, chills and diaphoresis.  HENT: Negative for neck pain and neck stiffness.   Respiratory: Negative for shortness of breath.   Cardiovascular: Positive for chest pain. Negative for palpitations and leg swelling.  Gastrointestinal: Positive for nausea, vomiting, abdominal pain and diarrhea. Negative for blood in stool.  Genitourinary: Negative for dysuria, urgency, hematuria, flank pain, vaginal discharge, difficulty urinating and menstrual problem.  Musculoskeletal: Negative for back pain.  Neurological: Negative for dizziness, weakness, light-headedness and headaches.  All other systems reviewed and are negative.    Allergies  Morphine and related  Home Medications  Current Outpatient Rx  Name  Route  Sig  Dispense  Refill  . albuterol (PROVENTIL HFA;VENTOLIN HFA) 108 (90 BASE) MCG/ACT inhaler   Inhalation   Inhale 2 puffs into the lungs every 4 (four) hours as needed for wheezing or shortness of breath.         . gabapentin (NEURONTIN) 800 MG tablet   Oral   Take 800 mg by mouth at bedtime.         . hydrOXYzine (ATARAX/VISTARIL) 50 MG tablet    Oral   Take 100 mg by mouth every 6 (six) hours as needed for anxiety.          . naproxen (NAPROSYN) 500 MG tablet   Oral   Take 1 tablet (500 mg total) by mouth 2 (two) times daily. For pain management   20 tablet   0   . nitrofurantoin, macrocrystal-monohydrate, (MACROBID) 100 MG capsule   Oral   Take 1 capsule (100 mg total) by mouth 2 (two) times daily.   10 capsule   0   . ondansetron (ZOFRAN) 4 MG tablet   Oral   Take 4 mg by mouth every 6 (six) hours.         Marland Kitchen oxyCODONE-acetaminophen (PERCOCET/ROXICET) 5-325 MG per tablet   Oral   Take 2 tablets by mouth every 4 (four) hours as needed for pain.   15 tablet   0   . QUEtiapine (SEROQUEL XR) 400 MG 24 hr tablet   Oral   Take 800 mg by mouth at bedtime.         . sertraline (ZOLOFT) 100 MG tablet   Oral   Take 150 mg by mouth at bedtime.            BP 122/98  Pulse 90  Temp(Src) 97.9 F (36.6 C) (Oral)  Resp 18  SpO2 94%  Physical Exam  Nursing note and vitals reviewed. Constitutional: She is oriented to person, place, and time. She appears well-developed and well-nourished. No distress.  Obese  HENT:  Head: Normocephalic and atraumatic.  Mouth/Throat: Oropharynx is clear and moist.  Eyes: Conjunctivae and EOM are normal. Pupils are equal, round, and reactive to light.  Neck: Normal range of motion. Neck supple.  Cardiovascular: Normal rate, regular rhythm, normal heart sounds and intact distal pulses.   No extremity edema.  Pulmonary/Chest: Effort normal and breath sounds normal. No respiratory distress. She has no decreased breath sounds. She has no wheezes. She has no rhonchi. She has no rales. She exhibits tenderness.    Abdominal: Soft. Normal appearance and bowel sounds are normal. She exhibits no distension and no mass. There is generalized tenderness (generalized, worse lower abdominal region). There is no rigidity, no rebound, no guarding and no CVA tenderness.  Musculoskeletal: Normal  range of motion. She exhibits no edema.  Lymphadenopathy:    She has no cervical adenopathy.  Neurological: She is alert and oriented to person, place, and time.  Skin: Skin is warm and dry. No rash noted. She is not diaphoretic.  Psychiatric: She has a normal mood and affect. Her behavior is normal.    ED Course  Procedures (including critical care time)  Labs Reviewed  CBC WITH DIFFERENTIAL - Abnormal; Notable for the following:    Eosinophils Relative 6 (*)    All other components within normal limits  COMPREHENSIVE METABOLIC PANEL - Abnormal; Notable for the following:    Glucose, Bld 120 (*)    Creatinine, Ser 0.43 (*)  Albumin 3.4 (*)    AST 70 (*)    ALT 50 (*)    Total Bilirubin 0.2 (*)    All other components within normal limits  URINALYSIS, ROUTINE W REFLEX MICROSCOPIC - Abnormal; Notable for the following:    APPearance CLOUDY (*)    Hgb urine dipstick LARGE (*)    Leukocytes, UA TRACE (*)    All other components within normal limits  URINE MICROSCOPIC-ADD ON - Abnormal; Notable for the following:    Squamous Epithelial / LPF FEW (*)    All other components within normal limits  URINE CULTURE  LIPASE, BLOOD  POCT I-STAT TROPONIN I  POCT PREGNANCY, URINE    Date: 05/27/2012  Rate: 86  Rhythm: normal sinus rhythm  QRS Axis: left  Intervals: normal  ST/T Wave abnormalities: normal  Conduction Disutrbances:none  Narrative Interpretation: no stemi  Old EKG Reviewed: unchanged   No results found.   1. Chest pain   2. Abdominal pain       MDM  27 year old female with chest pain and abdominal pain reproducible on exam. She is currently undergoing chemotherapy for rhabdo sarcoma and cancer of her abdominal wall. Labs unremarkable. Large hemoglobin in urine due to patient being on her menses at this time. Pain completely subsided after one dose of IV Dilaudid. No shortness of breath or any other symptoms. Patient is asking if she can go home and states she  is feeling completely better. Return precautions discussed. She will followup with her oncologist.        Trevor Mace, PA-C 05/27/12 1306

## 2012-05-27 NOTE — ED Notes (Signed)
Pt reports chest pain and generalized abd pain that started last night. Reports some n/v. Denies any urinary symptoms at this time. Skin warm and dry. No distress noted.

## 2012-05-28 LAB — URINE CULTURE: Colony Count: NO GROWTH

## 2012-07-04 ENCOUNTER — Emergency Department (HOSPITAL_COMMUNITY): Payer: Self-pay

## 2012-07-04 ENCOUNTER — Emergency Department (HOSPITAL_COMMUNITY)
Admission: EM | Admit: 2012-07-04 | Discharge: 2012-07-04 | Disposition: A | Discharge status: Home / Self Care | Payer: Self-pay | Attending: Emergency Medicine | Admitting: Emergency Medicine

## 2012-07-04 ENCOUNTER — Encounter (HOSPITAL_COMMUNITY): Payer: Self-pay | Admitting: *Deleted

## 2012-07-04 DIAGNOSIS — Z79899 Other long term (current) drug therapy: Secondary | ICD-10-CM | POA: Insufficient documentation

## 2012-07-04 DIAGNOSIS — I10 Essential (primary) hypertension: Secondary | ICD-10-CM | POA: Insufficient documentation

## 2012-07-04 DIAGNOSIS — Z8589 Personal history of malignant neoplasm of other organs and systems: Secondary | ICD-10-CM | POA: Insufficient documentation

## 2012-07-04 DIAGNOSIS — Z3202 Encounter for pregnancy test, result negative: Secondary | ICD-10-CM | POA: Insufficient documentation

## 2012-07-04 DIAGNOSIS — Z85828 Personal history of other malignant neoplasm of skin: Secondary | ICD-10-CM | POA: Insufficient documentation

## 2012-07-04 DIAGNOSIS — E119 Type 2 diabetes mellitus without complications: Secondary | ICD-10-CM | POA: Insufficient documentation

## 2012-07-04 DIAGNOSIS — Z862 Personal history of diseases of the blood and blood-forming organs and certain disorders involving the immune mechanism: Secondary | ICD-10-CM | POA: Insufficient documentation

## 2012-07-04 DIAGNOSIS — R109 Unspecified abdominal pain: Secondary | ICD-10-CM | POA: Insufficient documentation

## 2012-07-04 DIAGNOSIS — E669 Obesity, unspecified: Secondary | ICD-10-CM | POA: Insufficient documentation

## 2012-07-04 DIAGNOSIS — F329 Major depressive disorder, single episode, unspecified: Secondary | ICD-10-CM | POA: Insufficient documentation

## 2012-07-04 DIAGNOSIS — R42 Dizziness and giddiness: Secondary | ICD-10-CM | POA: Insufficient documentation

## 2012-07-04 DIAGNOSIS — Z8639 Personal history of other endocrine, nutritional and metabolic disease: Secondary | ICD-10-CM | POA: Insufficient documentation

## 2012-07-04 DIAGNOSIS — F3289 Other specified depressive episodes: Secondary | ICD-10-CM | POA: Insufficient documentation

## 2012-07-04 DIAGNOSIS — Z9889 Other specified postprocedural states: Secondary | ICD-10-CM | POA: Insufficient documentation

## 2012-07-04 DIAGNOSIS — F411 Generalized anxiety disorder: Secondary | ICD-10-CM | POA: Insufficient documentation

## 2012-07-04 DIAGNOSIS — R51 Headache: Secondary | ICD-10-CM | POA: Insufficient documentation

## 2012-07-04 DIAGNOSIS — Z9089 Acquired absence of other organs: Secondary | ICD-10-CM | POA: Insufficient documentation

## 2012-07-04 DIAGNOSIS — R1110 Vomiting, unspecified: Secondary | ICD-10-CM | POA: Insufficient documentation

## 2012-07-04 DIAGNOSIS — R197 Diarrhea, unspecified: Secondary | ICD-10-CM | POA: Insufficient documentation

## 2012-07-04 LAB — COMPREHENSIVE METABOLIC PANEL
ALT: 37 U/L — ABNORMAL HIGH (ref 0–35)
AST: 53 U/L — ABNORMAL HIGH (ref 0–37)
Albumin: 3.6 g/dL (ref 3.5–5.2)
CO2: 24 mEq/L (ref 19–32)
Calcium: 9.1 mg/dL (ref 8.4–10.5)
Creatinine, Ser: 0.52 mg/dL (ref 0.50–1.10)
GFR calc non Af Amer: >90 mL/min (ref >90)
Sodium: 137 mEq/L (ref 135–145)
Total Protein: 7.4 g/dL (ref 6.0–8.3)

## 2012-07-04 LAB — CBC WITH DIFFERENTIAL/PLATELET
Basophils Absolute: 0 10*3/uL (ref 0.0–0.1)
Basophils Relative: 0 % (ref 0–1)
Eosinophils Absolute: 0.3 10*3/uL (ref 0.0–0.7)
Eosinophils Relative: 3 % (ref 0–5)
HCT: 38.7 % (ref 36.0–46.0)
MCHC: 34.9 g/dL (ref 30.0–36.0)
MCV: 82 fL (ref 78.0–100.0)
Monocytes Absolute: 0.4 10*3/uL (ref 0.1–1.0)
Platelets: 241 10*3/uL (ref 150–400)
RDW: 13.3 % (ref 11.5–15.5)
WBC: 9.2 10*3/uL (ref 4.0–10.5)

## 2012-07-04 LAB — URINE MICROSCOPIC-ADD ON

## 2012-07-04 LAB — URINALYSIS, ROUTINE W REFLEX MICROSCOPIC
Hgb urine dipstick: NEGATIVE
Specific Gravity, Urine: 1.024 (ref 1.005–1.030)
Urobilinogen, UA: 0.2 mg/dL (ref 0.0–1.0)
pH: 6 (ref 5.0–8.0)

## 2012-07-04 MED ORDER — CLONIDINE HCL 0.2 MG PO TABS
0.2000 mg | ORAL_TABLET | Freq: Once | ORAL | Status: AC
  Administered 2012-07-04: 0.2 mg via ORAL
  Filled 2012-07-04: qty 1

## 2012-07-04 MED ORDER — METRONIDAZOLE 500 MG PO TABS: 500.0000 mg | ORAL_TABLET | Freq: Three times a day (TID) | ORAL | Status: DC

## 2012-07-04 MED ORDER — ONDANSETRON HCL 4 MG/2ML IJ SOLN
4.0000 mg | Freq: Once | INTRAMUSCULAR | Status: AC
  Administered 2012-07-04: 4 mg via INTRAVENOUS
  Filled 2012-07-04: qty 2

## 2012-07-04 MED ORDER — HYDROCODONE-ACETAMINOPHEN 5-325 MG PO TABS: 1.0000 | ORAL_TABLET | ORAL | Status: DC | PRN

## 2012-07-04 MED ORDER — FENTANYL CITRATE 0.05 MG/ML IJ SOLN
100.0000 ug | Freq: Once | INTRAMUSCULAR | Status: AC
  Administered 2012-07-04: 100 ug via INTRAVENOUS
  Filled 2012-07-04: qty 2

## 2012-07-04 MED ORDER — CLONIDINE HCL 0.2 MG PO TABS: 0.2000 mg | ORAL_TABLET | Freq: Two times a day (BID) | ORAL | Status: DC

## 2012-07-04 MED ORDER — PROMETHAZINE HCL 25 MG PO TABS: 25.0000 mg | ORAL_TABLET | Freq: Four times a day (QID) | ORAL | Status: DC | PRN

## 2012-07-04 NOTE — ED Notes (Signed)
Pt attempted to have BM again. Pt states she is unable to have a BM at this time. Pt ambulatory in hall. NAD noted.

## 2012-07-04 NOTE — ED Notes (Signed)
Pt alert and mentating appropriately upon d/c. Pt given d/c teaching, follow up care and prescriptions. Pt instructed not to drive. Family at bedside states she will drive pt home. NAD noted upon d/c. Pt leaving with d/c teaching. Pt has no further questions upon d/c.

## 2012-07-04 NOTE — ED Notes (Signed)
Patient request pain medications as she states she is "really in pain".

## 2012-07-04 NOTE — ED Provider Notes (Signed)
History     CSN: [id number]  Arrival date & time 07/04/12  1549   First MD Initiated Contact with Patient 07/04/12 1745      Chief Complaint  Patient presents with  . Emesis  . Diarrhea  . Dizziness     HPI Pt in c/o n/v/d, left lower quadrant pain, and dizziness, states she has had diarrhea for approx 1 week, history of CDif in the past and is concerned that she has it again. Pt states she feels dizzy when she stands up.  Patient also noticed she had an elevated blood pressure and developed a headache today.  Patient denies fever or chills.  Patient is on no new medications.  Patient denies sexual activity.  Patient denies vaginal discharge.  Past Medical History  Diagnosis Date  . Polycystic ovarian syndrome 07/01/2011    Patient report  . Anxiety   . Depression   . Cancer of abdominal wall   . Rhabdosarcoma   . Hypertension   . Diabetes mellitus without complication   . Obesity     Past Surgical History  Procedure Laterality Date  . Cholecystectomy    . Varicose vein surgery    . Ovarian cyst excision      Family History  Problem Relation Age of Onset  . Coronary artery disease Maternal Grandmother   . Diabetes type II Maternal Grandmother   . Cancer Maternal Grandmother   . Hypertension Mother   . Hypertension Father     History  Substance Use Topics  . Smoking status: Never Smoker   . Smokeless tobacco: Never Used  . Alcohol Use: No    OB History   Grav Para Term Preterm Abortions TAB SAB Ect Mult Living   0               Review of Systems All other systems reviewed and are negative Allergies  Morphine and related  Home Medications   Current Outpatient Rx  Name  Route  Sig  Dispense  Refill  . albuterol (PROVENTIL HFA;VENTOLIN HFA) 108 (90 BASE) MCG/ACT inhaler   Inhalation   Inhale 2 puffs into the lungs every 4 (four) hours as needed for wheezing or shortness of breath.         . gabapentin (NEURONTIN) 400 MG capsule   Oral   Take 800  mg by mouth 2 (two) times daily.         . hydrOXYzine (ATARAX/VISTARIL) 50 MG tablet   Oral   Take 100 mg by mouth every 6 (six) hours as needed for anxiety.          . naproxen (NAPROSYN) 500 MG tablet   Oral   Take 500 mg by mouth 2 (two) times daily.         Marland Kitchen oxyCODONE-acetaminophen (PERCOCET/ROXICET) 5-325 MG per tablet   Oral   Take 2 tablets by mouth every 4 (four) hours as needed for pain.   15 tablet   0   . QUEtiapine (SEROQUEL XR) 400 MG 24 hr tablet   Oral   Take 800 mg by mouth at bedtime.         . sertraline (ZOLOFT) 100 MG tablet   Oral   Take 150 mg by mouth at bedtime.          . cloNIDine (CATAPRES) 0.2 MG tablet   Oral   Take 1 tablet (0.2 mg total) by mouth 2 (two) times daily.   30 tablet   0   .  HYDROcodone-acetaminophen (NORCO/VICODIN) 5-325 MG per tablet   Oral   Take 1 tablet by mouth every 4 (four) hours as needed for pain.   15 tablet   0   . metroNIDAZOLE (FLAGYL) 500 MG tablet   Oral   Take 1 tablet (500 mg total) by mouth 3 (three) times daily.   30 tablet   0   . promethazine (PHENERGAN) 25 MG tablet   Oral   Take 1 tablet (25 mg total) by mouth every 6 (six) hours as needed for nausea.   20 tablet   0     BP 113/81  Pulse 91  Temp(Src) 98.4 F (36.9 C) (Oral)  Resp 17  SpO2 96%  LMP 06/21/2012  Physical Exam  Nursing note and vitals reviewed. Constitutional: She is oriented to person, place, and time. She appears well-developed and well-nourished. No distress.  HENT:  Head: Normocephalic and atraumatic.  Eyes: Pupils are equal, round, and reactive to light.  Neck: Normal range of motion. Neck supple.  Cardiovascular: Normal rate and intact distal pulses.   Pulmonary/Chest: No respiratory distress.  Abdominal: Normal appearance. She exhibits no distension. There is tenderness (Left lower quadrant). There is no rebound and no guarding.  Musculoskeletal: Normal range of motion.  Neurological: She is alert  and oriented to person, place, and time. No cranial nerve deficit.  Skin: Skin is warm and dry. No rash noted.  Psychiatric: She has a normal mood and affect. Her behavior is normal.    ED Course  Procedures (including critical care time) Medications  cloNIDine (CATAPRES) tablet 0.2 mg (0.2 mg Oral Given 07/04/12 1828)  fentaNYL (SUBLIMAZE) injection 100 mcg (100 mcg Intravenous Given 07/04/12 1828)  ondansetron (ZOFRAN) injection 4 mg (4 mg Intravenous Given 07/04/12 1828)    Labs Reviewed  COMPREHENSIVE METABOLIC PANEL - Abnormal; Notable for the following:    Potassium 3.4 (*)    Glucose, Bld 113 (*)    AST 53 (*)    ALT 37 (*)    Total Bilirubin 0.2 (*)    All other components within normal limits  URINALYSIS, ROUTINE W REFLEX MICROSCOPIC - Abnormal; Notable for the following:    Leukocytes, UA SMALL (*)    All other components within normal limits  URINE MICROSCOPIC-ADD ON - Abnormal; Notable for the following:    Squamous Epithelial / LPF MANY (*)    Bacteria, UA MANY (*)    All other components within normal limits  CLOSTRIDIUM DIFFICILE BY PCR  URINE CULTURE  CBC WITH DIFFERENTIAL  PREGNANCY, URINE   Ct Head Wo Contrast  07/04/2012   *RADIOLOGY REPORT*  Clinical Data: Headache and dizziness  CT HEAD WITHOUT CONTRAST  Technique:  Contiguous axial images were obtained from the base of the skull through the vertex without contrast.  Comparison: 03/11/2012  Findings: Ventricle size is normal.  No acute infarct.  Negative for hemorrhage or mass lesion.  Calvarium is intact.  IMPRESSION: No acute abnormality.   Original Report Authenticated By: Janeece Riggers, M.D.   US Transvaginal Non-ob  07/04/2012   *RADIOLOGY REPORT*  Clinical Data: Left adnexal pain, history of left-sided ovarian cysts  TRANSVAGINAL ULTRASOUND OF PELVIS DOPPLER ULTRASOUND OF OVARIES  Technique:  Transvaginal ultrasound examination of the pelvis was performed including evaluation of the uterus, ovaries, adnexal  regions, and pelvic cul-de-sac. Color and duplex Doppler ultrasound was utilized to evaluate blood flow to the ovaries.  Comparison:  Pelvic ultrasound - 06/03/2012  Findings:  Uterus:  Anteverted; Normal in  appearance and size measuring 6.1 x 2.3 x 3.5 cm.  No discrete uterine mass.  Endometrium: Normal in size measuring 7.7 mm in diameter.  Right ovary: Normal in size measuring 3.6 x 2.1 x 2.2 cm. Several tiny sub centimeter follicles are noted within the right ovary, similar to recent prior examination.  Normal arterial and venous wave forms are demonstrated within the right ovary.  Left ovary: Normal in size measuring 3.3 x 3.7 x 2.4 cm. There is an approximately 1.8 x 1.5 x 1.0 cm largely anechoic lesion within the left ovary which is favored to represent the residua of previously noted dominant approximately 5.8 cm left-sided ovarian cyst.  Several additional sub centimeter follicles are noted within the left ovary.  Normal arterial and venous wave forms are demonstrated within the left ovary.  Other Findings:  No free fluid.  IMPRESSION:  1.  No explanation for patient's left sided adnexal pain. Specifically, no evidence of ovarian mass or torsion. 2.  Near complete resolution of previously noted dominant approximately left-sided adnexal cyst, previously 5.8 cm, now measuring only 1.8 cm.   Original Report Authenticated By: Tacey Ruiz, MD   US Pelvis Complete  07/04/2012   *RADIOLOGY REPORT*  Clinical Data: Left adnexal pain, history of left-sided ovarian cysts  TRANSVAGINAL ULTRASOUND OF PELVIS DOPPLER ULTRASOUND OF OVARIES  Technique:  Transvaginal ultrasound examination of the pelvis was performed including evaluation of the uterus, ovaries, adnexal regions, and pelvic cul-de-sac. Color and duplex Doppler ultrasound was utilized to evaluate blood flow to the ovaries.  Comparison:  Pelvic ultrasound - 06/03/2012  Findings:  Uterus:  Anteverted; Normal in appearance and size measuring 6.1 x 2.3 x 3.5  cm.  No discrete uterine mass.  Endometrium: Normal in size measuring 7.7 mm in diameter.  Right ovary: Normal in size measuring 3.6 x 2.1 x 2.2 cm. Several tiny sub centimeter follicles are noted within the right ovary, similar to recent prior examination.  Normal arterial and venous wave forms are demonstrated within the right ovary.  Left ovary: Normal in size measuring 3.3 x 3.7 x 2.4 cm. There is an approximately 1.8 x 1.5 x 1.0 cm largely anechoic lesion within the left ovary which is favored to represent the residua of previously noted dominant approximately 5.8 cm left-sided ovarian cyst.  Several additional sub centimeter follicles are noted within the left ovary.  Normal arterial and venous wave forms are demonstrated within the left ovary.  Other Findings:  No free fluid.  IMPRESSION:  1.  No explanation for patient's left sided adnexal pain. Specifically, no evidence of ovarian mass or torsion. 2.  Near complete resolution of previously noted dominant approximately left-sided adnexal cyst, previously 5.8 cm, now measuring only 1.8 cm.   Original Report Authenticated By: Tacey Ruiz, MD   [place] Art/ven Flow Abd Pelv Doppler  07/04/2012   *RADIOLOGY REPORT*  Clinical Data: Left adnexal pain, history of left-sided ovarian cysts  TRANSVAGINAL ULTRASOUND OF PELVIS DOPPLER ULTRASOUND OF OVARIES  Technique:  Transvaginal ultrasound examination of the pelvis was performed including evaluation of the uterus, ovaries, adnexal regions, and pelvic cul-de-sac. Color and duplex Doppler ultrasound was utilized to evaluate blood flow to the ovaries.  Comparison:  Pelvic ultrasound - 06/03/2012  Findings:  Uterus:  Anteverted; Normal in appearance and size measuring 6.1 x 2.3 x 3.5 cm.  No discrete uterine mass.  Endometrium: Normal in size measuring 7.7 mm in diameter.  Right ovary: Normal in size measuring 3.6 x 2.1 x  2.2 cm. Several tiny sub centimeter follicles are noted within the right ovary, similar to recent  prior examination.  Normal arterial and venous wave forms are demonstrated within the right ovary.  Left ovary: Normal in size measuring 3.3 x 3.7 x 2.4 cm. There is an approximately 1.8 x 1.5 x 1.0 cm largely anechoic lesion within the left ovary which is favored to represent the residua of previously noted dominant approximately 5.8 cm left-sided ovarian cyst.  Several additional sub centimeter follicles are noted within the left ovary.  Normal arterial and venous wave forms are demonstrated within the left ovary.  Other Findings:  No free fluid.  IMPRESSION:  1.  No explanation for patient's left sided adnexal pain. Specifically, no evidence of ovarian mass or torsion. 2.  Near complete resolution of previously noted dominant approximately left-sided adnexal cyst, previously 5.8 cm, now measuring only 1.8 cm.   Original Report Authenticated By: Tacey Ruiz, MD     1. Abdominal pain   2. Hypertension       MDM  Checked out to [place] Sheldon MD        Nelia Shi, MD 07/05/12 1050

## 2012-07-04 NOTE — ED Notes (Signed)
Patient returned from CT

## 2012-07-04 NOTE — ED Provider Notes (Signed)
Transvaginal US shows L ovarian cyst that has decreased in size since most recent evaluation in 03/2012.  CT head negative.  All results discussed w/ patient.  She is sitting up in bed.  Appears comfortable.  BP improved to 113/81.  She has gynecology f/u scheduled.  I advised f/u with primary care as well for further evaluation of hypertension.  Referred to [facility] but she is from [place]. She understands the possible consequences of uncontrolled BP.  Dr. Radford Pax recommended clonidine bid in the meantime.  She was also prescribed 15 vicodin as well as promethazine and 10d course of flagyl.  She was unable to give [place] a stool sample today but insists that she has c.diff.  Has had 10-12 watery, foul-smelling stools/d, same as when she had c.diff last year.  She does not appear dehydrated at this time but I advised that she drink plenty of fluids.  Return precautions discussed.   Otilio Miu, PA-C 07/04/12 [phone]

## 2012-07-04 NOTE — ED Notes (Signed)
Pt in c/o n/v/d and dizziness, states she has had diarrhea for approx 1 week, history of CDif in the past and is concerned that she has it again. Pt states she feels dizzy when she stands up.

## 2012-07-04 NOTE — ED Notes (Addendum)
Reconnected patient to diagnostic monitor.

## 2012-07-04 NOTE — ED Notes (Signed)
Pt states she attempted to have BM in hat given. Pt states she was unable to have BM. Pt able to walk without difficulty to restroom.

## 2012-07-04 NOTE — ED Notes (Signed)
Pt taken to ultrasound

## 2012-07-04 NOTE — ED Notes (Signed)
Katie S, PA at bedside. 

## 2012-07-06 LAB — URINE CULTURE

## 2012-11-16 ENCOUNTER — Emergency Department (HOSPITAL_COMMUNITY)
Admission: EM | Admit: 2012-11-16 | Discharge: 2012-11-16 | Disposition: A | Discharge status: Home / Self Care | Payer: Self-pay | Attending: Emergency Medicine | Admitting: Emergency Medicine

## 2012-11-16 ENCOUNTER — Encounter (HOSPITAL_COMMUNITY): Payer: Self-pay | Admitting: Emergency Medicine

## 2012-11-16 ENCOUNTER — Emergency Department (HOSPITAL_COMMUNITY): Payer: Self-pay

## 2012-11-16 DIAGNOSIS — R079 Chest pain, unspecified: Secondary | ICD-10-CM

## 2012-11-16 DIAGNOSIS — F411 Generalized anxiety disorder: Secondary | ICD-10-CM | POA: Insufficient documentation

## 2012-11-16 DIAGNOSIS — R072 Precordial pain: Secondary | ICD-10-CM | POA: Insufficient documentation

## 2012-11-16 DIAGNOSIS — F3289 Other specified depressive episodes: Secondary | ICD-10-CM | POA: Insufficient documentation

## 2012-11-16 DIAGNOSIS — Z85028 Personal history of other malignant neoplasm of stomach: Secondary | ICD-10-CM | POA: Insufficient documentation

## 2012-11-16 DIAGNOSIS — Z8739 Personal history of other diseases of the musculoskeletal system and connective tissue: Secondary | ICD-10-CM | POA: Insufficient documentation

## 2012-11-16 DIAGNOSIS — Z79899 Other long term (current) drug therapy: Secondary | ICD-10-CM | POA: Insufficient documentation

## 2012-11-16 DIAGNOSIS — Z3202 Encounter for pregnancy test, result negative: Secondary | ICD-10-CM | POA: Insufficient documentation

## 2012-11-16 DIAGNOSIS — F329 Major depressive disorder, single episode, unspecified: Secondary | ICD-10-CM | POA: Insufficient documentation

## 2012-11-16 DIAGNOSIS — N390 Urinary tract infection, site not specified: Secondary | ICD-10-CM | POA: Insufficient documentation

## 2012-11-16 DIAGNOSIS — I10 Essential (primary) hypertension: Secondary | ICD-10-CM | POA: Insufficient documentation

## 2012-11-16 DIAGNOSIS — E119 Type 2 diabetes mellitus without complications: Secondary | ICD-10-CM | POA: Insufficient documentation

## 2012-11-16 DIAGNOSIS — Z8742 Personal history of other diseases of the female genital tract: Secondary | ICD-10-CM | POA: Insufficient documentation

## 2012-11-16 DIAGNOSIS — E669 Obesity, unspecified: Secondary | ICD-10-CM | POA: Insufficient documentation

## 2012-11-16 LAB — CBC
HCT: 37.5 % (ref 36.0–46.0)
Hemoglobin: 12.9 g/dL (ref 12.0–15.0)
MCHC: 34.4 g/dL (ref 30.0–36.0)
RDW: 14.2 % (ref 11.5–15.5)
WBC: 9.4 10*3/uL (ref 4.0–10.5)

## 2012-11-16 LAB — BASIC METABOLIC PANEL
BUN: 8 mg/dL (ref 6–23)
Chloride: 104 mEq/L (ref 96–112)
GFR calc Af Amer: >90 mL/min (ref >90)
GFR calc non Af Amer: >90 mL/min (ref >90)
Glucose, Bld: 122 mg/dL — ABNORMAL HIGH (ref 70–99)
Potassium: 3.5 mEq/L (ref 3.5–5.1)
Sodium: 138 mEq/L (ref 135–145)

## 2012-11-16 LAB — URINALYSIS, ROUTINE W REFLEX MICROSCOPIC
Glucose, UA: NEGATIVE mg/dL
Ketones, ur: NEGATIVE mg/dL
Nitrite: NEGATIVE
Protein, ur: NEGATIVE mg/dL

## 2012-11-16 LAB — URINE MICROSCOPIC-ADD ON

## 2012-11-16 LAB — POCT PREGNANCY, URINE: Preg Test, Ur: NEGATIVE

## 2012-11-16 LAB — POCT I-STAT TROPONIN I

## 2012-11-16 MED ORDER — SODIUM CHLORIDE 0.9 % IV BOLUS (SEPSIS)
1000.0000 mL | Freq: Once | INTRAVENOUS | Status: AC
  Administered 2012-11-16: 1000 mL via INTRAVENOUS

## 2012-11-16 MED ORDER — ONDANSETRON HCL 4 MG/2ML IJ SOLN
4.0000 mg | Freq: Once | INTRAMUSCULAR | Status: AC
  Administered 2012-11-16: 4 mg via INTRAVENOUS
  Filled 2012-11-16: qty 2

## 2012-11-16 MED ORDER — KETOROLAC TROMETHAMINE 30 MG/ML IJ SOLN
30.0000 mg | Freq: Once | INTRAMUSCULAR | Status: AC
  Administered 2012-11-16: 30 mg via INTRAVENOUS
  Filled 2012-11-16: qty 1

## 2012-11-16 MED ORDER — HYDROCODONE-ACETAMINOPHEN 5-325 MG PO TABS: 1.0000 | ORAL_TABLET | ORAL | Status: DC | PRN

## 2012-11-16 MED ORDER — SULFAMETHOXAZOLE-TRIMETHOPRIM 800-160 MG PO TABS: 1.0000 | ORAL_TABLET | Freq: Two times a day (BID) | ORAL | Status: DC

## 2012-11-16 MED ORDER — ONDANSETRON 4 MG PO TBDP: 4.0000 mg | ORAL_TABLET | Freq: Three times a day (TID) | ORAL | Status: DC | PRN

## 2012-11-16 MED ORDER — OXYCODONE-ACETAMINOPHEN 5-325 MG PO TABS
1.0000 | ORAL_TABLET | Freq: Once | ORAL | Status: AC
  Administered 2012-11-16: 1 via ORAL
  Filled 2012-11-16: qty 1

## 2012-11-16 NOTE — ED Notes (Signed)
Pt c/o C/p with Nausea, lower back pain with weakness, and diaphoresis.

## 2012-11-16 NOTE — ED Provider Notes (Signed)
CSN: [id number]     Arrival date & time 11/16/12  2045 History   First MD Initiated Contact with Patient 11/16/12 2100     Chief Complaint  Patient presents with  . Chest Pain   (Consider location/radiation/quality/duration/timing/severity/associated sxs/prior Treatment) HPI Onset was gradual, worsening.  The pain is located to right side of abdomen, mid chest. Modifying factors: none.  Associated symptoms: dysuria, nausea but no emesis, no SOB.  Recent medical care: none. Pt states history of chest pain and this feels similar. She states she is more concerned about flank pain.   Past Medical History  Diagnosis Date  . Polycystic ovarian syndrome 07/01/2011    Patient report  . Anxiety   . Depression   . Cancer of abdominal wall   . Rhabdosarcoma   . Hypertension   . Diabetes mellitus without complication   . Obesity    Past Surgical History  Procedure Laterality Date  . Cholecystectomy    . Varicose vein surgery    . Ovarian cyst excision     Family History  Problem Relation Age of Onset  . Coronary artery disease Maternal Grandmother   . Diabetes type II Maternal Grandmother   . Cancer Maternal Grandmother   . Hypertension Mother   . Hypertension Father    History  Substance Use Topics  . Smoking status: Never Smoker   . Smokeless tobacco: Never Used  . Alcohol Use: No   OB History   Grav Para Term Preterm Abortions TAB SAB Ect Mult Living   0              Review of Systems Constitutional: Negative for fever.  Eyes: Negative for vision loss.  ENT: Negative for difficulty swallowing.  Cardiovascular: Positive for chest pain. Respiratory: Negative for respiratory distress.  Gastrointestinal:  Negative for vomiting.  Genitourinary: Negative for inability to void.  Musculoskeletal: Negative for gait problem.  Integumentary: Negative for rash.  Neurological: Negative for new focal weakness.     Allergies  Geodon; Haldol; and Morphine and related  Home  Medications   Current Outpatient Rx  Name  Route  Sig  Dispense  Refill  . atenolol (TENORMIN) 25 MG tablet   Oral   Take 25 mg by mouth daily.         . busPIRone (BUSPAR) 15 MG tablet   Oral   Take 15 mg by mouth 3 (three) times daily.         . hydrOXYzine (VISTARIL) 25 MG capsule   Oral   Take 25 mg by mouth 3 (three) times daily as needed for anxiety.         Marland Kitchen oxcarbazepine (TRILEPTAL) 600 MG tablet   Oral   Take 600 mg by mouth 2 (two) times daily.         . QUEtiapine (SEROQUEL XR) 400 MG 24 hr tablet   Oral   Take 800 mg by mouth at bedtime.         . sertraline (ZOLOFT) 100 MG tablet   Oral   Take 150 mg by mouth at bedtime.          Marland Kitchen HYDROcodone-acetaminophen (NORCO/VICODIN) 5-325 MG per tablet   Oral   Take 1 tablet by mouth every 4 (four) hours as needed for pain.   5 tablet   0   . ondansetron (ZOFRAN ODT) 4 MG disintegrating tablet   Oral   Take 1 tablet (4 mg total) by mouth every 8 (eight) hours as  needed for nausea.   20 tablet   0   . sulfamethoxazole-trimethoprim (SEPTRA DS) 800-160 MG per tablet   Oral   Take 1 tablet by mouth every 12 (twelve) hours.   14 tablet   0    BP 110/52  Pulse 94  Temp(Src) 99 F (37.2 C) (Oral)  Resp 17  SpO2 98%  LMP 11/16/2012 Physical Exam Nursing note and vitals reviewed.  Constitutional: Pt is alert and appears stated age. Eyes: No injection, no scleral icterus. HENT: Atraumatic, airway open without erythema or exudate.  Respiratory: No respiratory distress. Equal breathing bilaterally. Cardiovascular: Normal rate. Extremities warm and well perfused.  Abdomen: Soft, non-distended, no lower abdominal tenderness, R CVA tenderness present. No rebound or guarding. MSK: Extremities are atraumatic without deformity. No midline back pain.  Skin: No rash, no wounds.   Neuro: No motor nor sensory deficit.     ED Course  Procedures (including critical care time) Labs Review Labs Reviewed   BASIC METABOLIC PANEL - Abnormal; Notable for the following:    Glucose, Bld 122 (*)    All other components within normal limits  URINALYSIS, ROUTINE W REFLEX MICROSCOPIC - Abnormal; Notable for the following:    Color, Urine AMBER (*)    APPearance TURBID (*)    Leukocytes, UA MODERATE (*)    All other components within normal limits  URINE MICROSCOPIC-ADD ON - Abnormal; Notable for the following:    Squamous Epithelial / LPF MANY (*)    Bacteria, UA FEW (*)    Casts HYALINE CASTS (*)    All other components within normal limits  URINE CULTURE  CBC  POCT I-STAT TROPONIN I  POCT PREGNANCY, URINE   Imaging Review Dg Chest 2 View  11/16/2012   CLINICAL DATA:  Chest pain. Low back pain.  EXAM: CHEST  2 VIEW  COMPARISON:  09/15/2012.  FINDINGS: The cardiopericardial silhouette remains mildly enlarged, some of which is likely due to the low lung volumes. There is no airspace disease or effusion. No evidence of failure. Lung volumes are lower than on prior with basilar atelectasis. Mediastinal contours are unchanged. Cholecystectomy clips are present in the right upper quadrant.  IMPRESSION: Low volumes with basilar atelectasis. No acute cardiopulmonary disease.   Electronically Signed   By: Andreas Newport M.D.   On: 11/16/2012 22:35    EKG Interpretation     Ventricular Rate:  90 PR Interval:  174 QRS Duration: 90 QT Interval:  386 QTC Calculation: 472 R Axis:   18 Text Interpretation:  Normal sinus rhythm Anterior infarct , age undetermined Abnormal ECG Since last tracing Rate slower            MDM   1. UTI (urinary tract infection)   2. Chest pain    27 y.o. female w/ PMHx of HTN, DM, cholecystectomy, PCOS, anxiety, depression presents w/ chest pain, right flank pain. Pt looks well, no distress. Sitting comfortably in ED bed. No tachycardia on my evaluation. EKG without signs of ischemia. Not c/w ACS, PE, or dissection. CXR without PTX or PNA. Right flank pain,  dysuria more concerning for urinary tract infection. Pt given IVF, IV zofran, IV toradol, PO percocet for symptoms with improvement. BMP without renal failure. CBC without leukocytosis. UA with 7-10 WBCs. Given symptoms will treat with abx. U preg neg. Nothing to suggest pelvic pathology. Presentation not c/w torsion or kidney stone. Pt concerned about lower leg soft tissue swelling as well. Will have pt f/u with pcp,  pt given resources for follow up. Do feel pt appropriate for d/c home. Counseling provided regarding diagnosis, treatment plan, follow up recommendations, and return precautions. Questions answered.       I independently viewed, interpreted, and used in my medical decision making all ordered lab and imaging tests. Medical Decision Making discussed with ED attending Dr. Bernette Mayers.      Charm Barges, MD 11/17/12 [phone]

## 2012-11-16 NOTE — ED Notes (Signed)
MD aware of patient's pain 

## 2012-11-16 NOTE — ED Notes (Signed)
Denies SOB, and pain exacerbated with exertion

## 2012-11-17 NOTE — ED Provider Notes (Signed)
I saw and evaluated the patient, reviewed the resident's note and I agree with the findings and plan.  EKG Interpretation     Ventricular Rate:  90 PR Interval:  174 QRS Duration: 90 QT Interval:  386 QTC Calculation: 472 R Axis:   18 Text Interpretation:  Normal sinus rhythm Anterior infarct , age undetermined Abnormal ECG Since last tracing Rate slower            Pt with numerous complaints, most of which are chronic, including atypical chest pain and R flank/buttock pain. Asking for pain medications. No concern for ACS/PE.   Taraji Mungo B. Bernette Mayers, MD 11/17/12 [phone]

## 2012-11-18 LAB — URINE CULTURE: Colony Count: >=100000

## 2012-11-28 ENCOUNTER — Other Ambulatory Visit: Payer: Self-pay

## 2013-03-16 ENCOUNTER — Encounter (HOSPITAL_COMMUNITY): Payer: Self-pay | Admitting: Emergency Medicine

## 2013-03-16 DIAGNOSIS — E119 Type 2 diabetes mellitus without complications: Secondary | ICD-10-CM | POA: Insufficient documentation

## 2013-03-16 DIAGNOSIS — Z9089 Acquired absence of other organs: Secondary | ICD-10-CM | POA: Insufficient documentation

## 2013-03-16 DIAGNOSIS — F3289 Other specified depressive episodes: Secondary | ICD-10-CM | POA: Insufficient documentation

## 2013-03-16 DIAGNOSIS — R197 Diarrhea, unspecified: Secondary | ICD-10-CM | POA: Insufficient documentation

## 2013-03-16 DIAGNOSIS — G8929 Other chronic pain: Secondary | ICD-10-CM | POA: Insufficient documentation

## 2013-03-16 DIAGNOSIS — Z8589 Personal history of malignant neoplasm of other organs and systems: Secondary | ICD-10-CM | POA: Insufficient documentation

## 2013-03-16 DIAGNOSIS — E669 Obesity, unspecified: Secondary | ICD-10-CM | POA: Insufficient documentation

## 2013-03-16 DIAGNOSIS — R110 Nausea: Secondary | ICD-10-CM | POA: Insufficient documentation

## 2013-03-16 DIAGNOSIS — I10 Essential (primary) hypertension: Secondary | ICD-10-CM | POA: Insufficient documentation

## 2013-03-16 DIAGNOSIS — C787 Secondary malignant neoplasm of liver and intrahepatic bile duct: Secondary | ICD-10-CM | POA: Insufficient documentation

## 2013-03-16 DIAGNOSIS — F411 Generalized anxiety disorder: Secondary | ICD-10-CM | POA: Insufficient documentation

## 2013-03-16 DIAGNOSIS — F329 Major depressive disorder, single episode, unspecified: Secondary | ICD-10-CM | POA: Insufficient documentation

## 2013-03-16 DIAGNOSIS — Z8509 Personal history of malignant neoplasm of other digestive organs: Secondary | ICD-10-CM | POA: Insufficient documentation

## 2013-03-16 DIAGNOSIS — Z79899 Other long term (current) drug therapy: Secondary | ICD-10-CM | POA: Insufficient documentation

## 2013-03-16 DIAGNOSIS — Z792 Long term (current) use of antibiotics: Secondary | ICD-10-CM | POA: Insufficient documentation

## 2013-03-16 DIAGNOSIS — R1084 Generalized abdominal pain: Secondary | ICD-10-CM | POA: Insufficient documentation

## 2013-03-16 DIAGNOSIS — Z3202 Encounter for pregnancy test, result negative: Secondary | ICD-10-CM | POA: Insufficient documentation

## 2013-03-16 LAB — URINE MICROSCOPIC-ADD ON

## 2013-03-16 LAB — URINALYSIS, ROUTINE W REFLEX MICROSCOPIC
GLUCOSE, UA: NEGATIVE mg/dL
HGB URINE DIPSTICK: NEGATIVE
Ketones, ur: NEGATIVE mg/dL
Nitrite: NEGATIVE
Protein, ur: NEGATIVE mg/dL
Specific Gravity, Urine: 1.034 — ABNORMAL HIGH (ref 1.005–1.030)
Urobilinogen, UA: 0.2 mg/dL (ref 0.0–1.0)
pH: 5 (ref 5.0–8.0)

## 2013-03-16 LAB — CBC WITH DIFFERENTIAL/PLATELET
BASOS ABS: 0 10*3/uL (ref 0.0–0.1)
Basophils Relative: 0 % (ref 0–1)
Eosinophils Absolute: 0.3 10*3/uL (ref 0.0–0.7)
Eosinophils Relative: 5 % (ref 0–5)
HEMATOCRIT: 38.8 % (ref 36.0–46.0)
Hemoglobin: 13 g/dL (ref 12.0–15.0)
LYMPHS PCT: 33 % (ref 12–46)
Lymphs Abs: 2.3 10*3/uL (ref 0.7–4.0)
MCH: 28.4 pg (ref 26.0–34.0)
MCHC: 33.5 g/dL (ref 30.0–36.0)
MCV: 84.7 fL (ref 78.0–100.0)
MONO ABS: 0.3 10*3/uL (ref 0.1–1.0)
Monocytes Relative: 5 % (ref 3–12)
NEUTROS ABS: 3.9 10*3/uL (ref 1.7–7.7)
Neutrophils Relative %: 57 % (ref 43–77)
Platelets: 197 10*3/uL (ref 150–400)
RBC: 4.58 MIL/uL (ref 3.87–5.11)
RDW: 13.3 % (ref 11.5–15.5)
WBC: 6.9 10*3/uL (ref 4.0–10.5)

## 2013-03-16 LAB — COMPREHENSIVE METABOLIC PANEL
ALK PHOS: 99 U/L (ref 39–117)
ALT: 44 U/L — ABNORMAL HIGH (ref 0–35)
AST: 66 U/L — AB (ref 0–37)
Albumin: 3.6 g/dL (ref 3.5–5.2)
BUN: 9 mg/dL (ref 6–23)
CHLORIDE: 103 meq/L (ref 96–112)
CO2: 25 meq/L (ref 19–32)
CREATININE: 0.79 mg/dL (ref 0.50–1.10)
Calcium: 8.9 mg/dL (ref 8.4–10.5)
GFR calc Af Amer: >90 mL/min (ref >90)
Glucose, Bld: 144 mg/dL — ABNORMAL HIGH (ref 70–99)
POTASSIUM: 3.8 meq/L (ref 3.7–5.3)
Sodium: 142 mEq/L (ref 137–147)
Total Protein: 7.2 g/dL (ref 6.0–8.3)

## 2013-03-16 LAB — PREGNANCY, URINE: Preg Test, Ur: NEGATIVE

## 2013-03-16 NOTE — ED Notes (Signed)
The pt is c/o lower abd pain for 7 days with nv and diarrhea.  She had a neg preg test today.  lmp  Jan 25th

## 2013-03-17 ENCOUNTER — Emergency Department (HOSPITAL_COMMUNITY)
Admission: EM | Admit: 2013-03-17 | Discharge: 2013-03-17 | Disposition: A | Discharge status: Home / Self Care | Payer: Self-pay | Attending: Emergency Medicine | Admitting: Emergency Medicine

## 2013-03-17 DIAGNOSIS — R109 Unspecified abdominal pain: Secondary | ICD-10-CM

## 2013-03-17 MED ORDER — ONDANSETRON HCL 4 MG/2ML IJ SOLN
4.0000 mg | Freq: Once | INTRAMUSCULAR | Status: AC
  Administered 2013-03-17: 4 mg via INTRAVENOUS
  Filled 2013-03-17: qty 2

## 2013-03-17 MED ORDER — ONDANSETRON HCL 4 MG PO TABS: 4.0000 mg | ORAL_TABLET | Freq: Four times a day (QID) | ORAL | Status: DC

## 2013-03-17 MED ORDER — HYDROCODONE-ACETAMINOPHEN 5-325 MG PO TABS: 1.0000 | ORAL_TABLET | ORAL | Status: DC | PRN

## 2013-03-17 MED ORDER — HYDROMORPHONE HCL PF 1 MG/ML IJ SOLN
1.0000 mg | Freq: Once | INTRAMUSCULAR | Status: AC
  Administered 2013-03-17: 1 mg via INTRAVENOUS
  Filled 2013-03-17: qty 1

## 2013-03-17 MED ORDER — DICYCLOMINE HCL 10 MG/ML IM SOLN
10.0000 mg | Freq: Once | INTRAMUSCULAR | Status: DC
  Filled 2013-03-17: qty 2

## 2013-03-17 MED ORDER — SODIUM CHLORIDE 0.9 % IV BOLUS (SEPSIS)
500.0000 mL | Freq: Once | INTRAVENOUS | Status: AC
  Administered 2013-03-17: 500 mL via INTRAVENOUS

## 2013-03-17 NOTE — ED Notes (Signed)
Pt BP 93/48. Olean Ree, NP is aware, and advised that as long as the pt was able to ambulate with steady gait and did not feel dizziness, pt would be able to be discharged. Pt ambulated without difficulty, and her BP now reads 113/67.

## 2013-03-17 NOTE — Discharge Instructions (Signed)
Abdominal Pain, Women °Abdominal (stomach, pelvic, or belly) pain can be caused by many things. It is important to tell your doctor: °· The location of the pain. °· Does it come and go or is it present all the time? °· Are there things that start the pain (eating certain foods, exercise)? °· Are there other symptoms associated with the pain (fever, nausea, vomiting, diarrhea)? °All of this is helpful to know when trying to find the cause of the pain. °CAUSES  °· Stomach: virus or bacteria infection, or ulcer. °· Intestine: appendicitis (inflamed appendix), regional ileitis (Crohn's disease), ulcerative colitis (inflamed colon), irritable bowel syndrome, diverticulitis (inflamed diverticulum of the colon), or cancer of the stomach or intestine. °· Gallbladder disease or stones in the gallbladder. °· Kidney disease, kidney stones, or infection. °· Pancreas infection or cancer. °· Fibromyalgia (pain disorder). °· Diseases of the female organs: °· Uterus: fibroid (non-cancerous) tumors or infection. °· Fallopian tubes: infection or tubal pregnancy. °· Ovary: cysts or tumors. °· Pelvic adhesions (scar tissue). °· Endometriosis (uterus lining tissue growing in the pelvis and on the pelvic organs). °· Pelvic congestion syndrome (female organs filling up with blood just before the menstrual period). °· Pain with the menstrual period. °· Pain with ovulation (producing an egg). °· Pain with an IUD (intrauterine device, birth control) in the uterus. °· Cancer of the female organs. °· Functional pain (pain not caused by a disease, may improve without treatment). °· Psychological pain. °· Depression. °DIAGNOSIS  °Your doctor will decide the seriousness of your pain by doing an examination. °· Blood tests. °· X-rays. °· Ultrasound. °· CT scan (computed tomography, special type of X-ray). °· MRI (magnetic resonance imaging). °· Cultures, for infection. °· Barium enema (dye inserted in the large intestine, to better view it with  X-rays). °· Colonoscopy (looking in intestine with a lighted tube). °· Laparoscopy (minor surgery, looking in abdomen with a lighted tube). °· Major abdominal exploratory surgery (looking in abdomen with a large incision). °TREATMENT  °The treatment will depend on the cause of the pain.  °· Many cases can be observed and treated at home. °· Over-the-counter medicines recommended by your caregiver. °· Prescription medicine. °· Antibiotics, for infection. °· Birth control pills, for painful periods or for ovulation pain. °· Hormone treatment, for endometriosis. °· Nerve blocking injections. °· Physical therapy. °· Antidepressants. °· Counseling with a psychologist or psychiatrist. °· Minor or major surgery. °HOME CARE INSTRUCTIONS  °· Do not take laxatives, unless directed by your caregiver. °· Take over-the-counter pain medicine only if ordered by your caregiver. Do not take aspirin because it can cause an upset stomach or bleeding. °· Try a clear liquid diet (broth or water) as ordered by your caregiver. Slowly move to a bland diet, as tolerated, if the pain is related to the stomach or intestine. °· Have a thermometer and take your temperature several times a day, and record it. °· Bed rest and sleep, if it helps the pain. °· Avoid sexual intercourse, if it causes pain. °· Avoid stressful situations. °· Keep your follow-up appointments and tests, as your caregiver orders. °· If the pain does not go away with medicine or surgery, you may try: °· Acupuncture. °· Relaxation exercises (yoga, meditation). °· Group therapy. °· Counseling. °SEEK MEDICAL CARE IF:  °· You notice certain foods cause stomach pain. °· Your home care treatment is not helping your pain. °· You need stronger pain medicine. °· You want your IUD removed. °· You feel faint or   lightheaded. °· You develop nausea and vomiting. °· You develop a rash. °· You are having side effects or an allergy to your medicine. °SEEK IMMEDIATE MEDICAL CARE IF:  °· Your  pain does not go away or gets worse. °· You have a fever. °· Your pain is felt only in portions of the abdomen. The right side could possibly be appendicitis. The left lower portion of the abdomen could be colitis or diverticulitis. °· You are passing blood in your stools (bright red or black tarry stools, with or without vomiting). °· You have blood in your urine. °· You develop chills, with or without a fever. °· You pass out. °MAKE SURE YOU:  °· Understand these instructions. °· Will watch your condition. °· Will get help right away if you are not doing well or get worse. °Document Released: 11/06/2006 Document Revised: 04/03/2011 Document Reviewed: 11/26/2008 °[employer]® Patient Information ©2014 [employer]. ° °

## 2013-03-17 NOTE — ED Provider Notes (Signed)
Medical screening examination/treatment/procedure(s) were performed by non-physician practitioner and as supervising physician I was immediately available for consultation/collaboration.  EKG Interpretation   None         Kierah Goatley, MD 03/17/13 0723 

## 2013-03-17 NOTE — ED Notes (Signed)
[place], PA at bedside  

## 2013-03-17 NOTE — ED Provider Notes (Signed)
CSN: [id number]     Arrival date & time 03/16/13  2206 History   First MD Initiated Contact with Patient 03/17/13 [phone]     Chief Complaint  Patient presents with  . Abdominal Pain     (Consider location/radiation/quality/duration/timing/severity/associated sxs/prior Treatment) HPI  Tina Saunders is a 28 y.o.female with a significant PMH of rhabdosarcoma, obesity, diabetes, hypertension (uncontrolled), PCOD, depression, anxiety, chronic abdominal pains (cholecystectomy), headaches presents to the ER with complaints of diffuse abdominal pain. It has been persisting for the past 7 days and accompanied by nausea. She has not had any vomiting or diarrhea. I note that the Triage Nurse reports V/D but patient denies this a second time She is sexually active with no vaginal discharge or irregular bleeding. Denies having pelvic or flank pains.She is unable to pin point the location of her discomfort. She took a pregnancy test today which was negative. She denies taking her blood pressure medications as she is supposed to.    Past Medical History  Diagnosis Date  . Polycystic ovarian syndrome 07/01/2011    Patient report  . Anxiety   . Depression   . Cancer of abdominal wall   . Rhabdosarcoma   . Hypertension   . Diabetes mellitus without complication   . Obesity    Past Surgical History  Procedure Laterality Date  . Cholecystectomy    . Varicose vein surgery    . Ovarian cyst excision     Family History  Problem Relation Age of Onset  . Coronary artery disease Maternal Grandmother   . Diabetes type II Maternal Grandmother   . Cancer Maternal Grandmother   . Hypertension Mother   . Hypertension Father    History  Substance Use Topics  . Smoking status: Never Smoker   . Smokeless tobacco: Never Used  . Alcohol Use: No   OB History   Grav Para Term Preterm Abortions TAB SAB Ect Mult Living   0              Review of Systems The patient denies anorexia, fever, weight loss,,  vision loss, decreased hearing, hoarseness, chest pain, syncope, dyspnea on exertion, peripheral edema, balance deficits, hemoptysis, melena, hematochezia, severe indigestion/heartburn, hematuria, incontinence, genital sores, muscle weakness, suspicious skin lesions, transient blindness, difficulty walking, depression, unusual weight change, abnormal bleeding, enlarged lymph nodes, angioedema, and breast masses.    Allergies  Geodon; Haldol; and Morphine and related  Home Medications   Current Outpatient Rx  Name  Route  Sig  Dispense  Refill  . atenolol (TENORMIN) 25 MG tablet   Oral   Take 25 mg by mouth daily.         . busPIRone (BUSPAR) 15 MG tablet   Oral   Take 15 mg by mouth 3 (three) times daily.         Marland Kitchen HYDROcodone-acetaminophen (NORCO/VICODIN) 5-325 MG per tablet   Oral   Take 1 tablet by mouth every 4 (four) hours as needed for pain.   5 tablet   0   . HYDROcodone-acetaminophen (NORCO/VICODIN) 5-325 MG per tablet   Oral   Take 1-2 tablets by mouth every 4 (four) hours as needed.   20 tablet   0   . hydrOXYzine (VISTARIL) 25 MG capsule   Oral   Take 25 mg by mouth 3 (three) times daily as needed for anxiety.         . ondansetron (ZOFRAN ODT) 4 MG disintegrating tablet   Oral   Take  1 tablet (4 mg total) by mouth every 8 (eight) hours as needed for nausea.   20 tablet   0   . ondansetron (ZOFRAN) 4 MG tablet   Oral   Take 1 tablet (4 mg total) by mouth every 6 (six) hours.   12 tablet   0   . oxcarbazepine (TRILEPTAL) 600 MG tablet   Oral   Take 600 mg by mouth 2 (two) times daily.         . QUEtiapine (SEROQUEL XR) 400 MG 24 hr tablet   Oral   Take 800 mg by mouth at bedtime.         . sertraline (ZOLOFT) 100 MG tablet   Oral   Take 150 mg by mouth at bedtime.          . sulfamethoxazole-trimethoprim (SEPTRA DS) 800-160 MG per tablet   Oral   Take 1 tablet by mouth every 12 (twelve) hours.   14 tablet   0    BP 102/58  Pulse  94  Temp(Src) 98.7 F (37.1 C) (Oral)  Resp 18  Ht 5\' 4"  (1.626 m)  Wt 268 lb 3 oz (121.649 kg)  BMI 46.01 kg/m2  SpO2 98%  LMP 02/16/2013 Physical Exam  Nursing note and vitals reviewed. Constitutional: She appears well-developed and well-nourished. No distress.  HENT:  Head: Normocephalic and atraumatic.  Eyes: Pupils are equal, round, and reactive to light.  Neck: Normal range of motion. Neck supple.  Cardiovascular: Normal rate and regular rhythm.   Pulmonary/Chest: Effort normal.  Abdominal: Soft. Bowel sounds are normal. She exhibits no ascites. There is tenderness (diffuse tenderness, not severe in any particular quadrant.  Mild to moderate). There is no rigidity, no rebound, no guarding and no CVA tenderness.  Exam limited by body habitus.   Neurological: She is alert.  Skin: Skin is warm and dry.    ED Course  Procedures (including critical care time) Labs Review Labs Reviewed  URINALYSIS, ROUTINE W REFLEX MICROSCOPIC - Abnormal; Notable for the following:    APPearance CLOUDY (*)    Specific Gravity, Urine 1.034 (*)    Bilirubin Urine SMALL (*)    Leukocytes, UA SMALL (*)    All other components within normal limits  COMPREHENSIVE METABOLIC PANEL - Abnormal; Notable for the following:    Glucose, Bld 144 (*)    AST 66 (*)    ALT 44 (*)    Total Bilirubin <0.2 (*)    All other components within normal limits  URINE MICROSCOPIC-ADD ON - Abnormal; Notable for the following:    Squamous Epithelial / LPF FEW (*)    Bacteria, UA FEW (*)    All other components within normal limits  PREGNANCY, URINE  CBC WITH DIFFERENTIAL   Imaging Review No results found.  EKG Interpretation   None       MDM   Final diagnoses:  Abdominal pain    Patient has chronic pains. I reviewed her chart and she has been here multiple times for abdominal pains. She does have a history of abdominal wall cancer, states she is in remission but still gets pains intermittently.  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 She reports this feels a lot like when her chronic pain is exacerbated. Her labs, however, look great and are at her baseline. She has no peritoneal signs and her abdomen is soft.    1:30am  Patients pain treated in the  ER with IV Dilaudid and fluids. She admits she is feeling better. Will call her doctor tomorrow for follow-up will give Rx for short course of pain medications and nausea medications.   144am- pts pain has improved still having some pain. Will give another shot of Dilaudid in 15 minutes if uncomfortable.  Rx: Vicodin and Zofran  27 y.o.Kimber Relic Murphey's evaluation in the Emergency Department is complete. It has been determined that no acute conditions requiring further emergency intervention are present at this time. The patient/guardian have been advised of the diagnosis and plan. We have discussed signs and symptoms that warrant return to the ED, such as changes or worsening in symptoms.  Vital signs are stable at discharge. Filed Vitals:   03/17/13 0045  BP: 102/58  Pulse: 94  Temp:   Resp:     Patient/guardian has voiced understanding and agreed to follow-up with the PCP or specialist.   Linus Mako, PA-C 03/17/13 0145

## 2013-04-15 ENCOUNTER — Emergency Department (HOSPITAL_COMMUNITY): Payer: Medicaid Other

## 2013-04-15 ENCOUNTER — Encounter (HOSPITAL_COMMUNITY): Payer: Self-pay | Admitting: Emergency Medicine

## 2013-04-15 ENCOUNTER — Observation Stay (HOSPITAL_COMMUNITY)
Admission: EM | Admit: 2013-04-15 | Discharge: 2013-04-17 | Disposition: A | Discharge status: Home / Self Care | Payer: Medicaid Other | Attending: Internal Medicine | Admitting: Internal Medicine

## 2013-04-15 DIAGNOSIS — I471 Supraventricular tachycardia, unspecified: Secondary | ICD-10-CM

## 2013-04-15 DIAGNOSIS — Z85828 Personal history of other malignant neoplasm of skin: Secondary | ICD-10-CM | POA: Insufficient documentation

## 2013-04-15 DIAGNOSIS — R030 Elevated blood-pressure reading, without diagnosis of hypertension: Secondary | ICD-10-CM

## 2013-04-15 DIAGNOSIS — Z79899 Other long term (current) drug therapy: Secondary | ICD-10-CM | POA: Insufficient documentation

## 2013-04-15 DIAGNOSIS — F3289 Other specified depressive episodes: Secondary | ICD-10-CM

## 2013-04-15 DIAGNOSIS — G479 Sleep disorder, unspecified: Secondary | ICD-10-CM

## 2013-04-15 DIAGNOSIS — R7301 Impaired fasting glucose: Secondary | ICD-10-CM

## 2013-04-15 DIAGNOSIS — R51 Headache: Principal | ICD-10-CM

## 2013-04-15 DIAGNOSIS — M542 Cervicalgia: Secondary | ICD-10-CM | POA: Insufficient documentation

## 2013-04-15 DIAGNOSIS — N949 Unspecified condition associated with female genital organs and menstrual cycle: Secondary | ICD-10-CM

## 2013-04-15 DIAGNOSIS — E119 Type 2 diabetes mellitus without complications: Secondary | ICD-10-CM | POA: Insufficient documentation

## 2013-04-15 DIAGNOSIS — N912 Amenorrhea, unspecified: Secondary | ICD-10-CM

## 2013-04-15 DIAGNOSIS — R1013 Epigastric pain: Secondary | ICD-10-CM

## 2013-04-15 DIAGNOSIS — E282 Polycystic ovarian syndrome: Secondary | ICD-10-CM

## 2013-04-15 DIAGNOSIS — R519 Headache, unspecified: Secondary | ICD-10-CM

## 2013-04-15 DIAGNOSIS — R5381 Other malaise: Secondary | ICD-10-CM

## 2013-04-15 DIAGNOSIS — R109 Unspecified abdominal pain: Secondary | ICD-10-CM

## 2013-04-15 DIAGNOSIS — J189 Pneumonia, unspecified organism: Secondary | ICD-10-CM

## 2013-04-15 DIAGNOSIS — R5383 Other fatigue: Secondary | ICD-10-CM

## 2013-04-15 DIAGNOSIS — H5461 Unqualified visual loss, right eye, normal vision left eye: Secondary | ICD-10-CM

## 2013-04-15 DIAGNOSIS — R509 Fever, unspecified: Secondary | ICD-10-CM

## 2013-04-15 DIAGNOSIS — Z886 Allergy status to analgesic agent status: Secondary | ICD-10-CM | POA: Insufficient documentation

## 2013-04-15 DIAGNOSIS — R42 Dizziness and giddiness: Secondary | ICD-10-CM

## 2013-04-15 DIAGNOSIS — H5460 Unqualified visual loss, one eye, unspecified: Secondary | ICD-10-CM

## 2013-04-15 DIAGNOSIS — Z3202 Encounter for pregnancy test, result negative: Secondary | ICD-10-CM | POA: Insufficient documentation

## 2013-04-15 DIAGNOSIS — F909 Attention-deficit hyperactivity disorder, unspecified type: Secondary | ICD-10-CM

## 2013-04-15 DIAGNOSIS — Z888 Allergy status to other drugs, medicaments and biological substances status: Secondary | ICD-10-CM | POA: Insufficient documentation

## 2013-04-15 DIAGNOSIS — E876 Hypokalemia: Secondary | ICD-10-CM

## 2013-04-15 DIAGNOSIS — E669 Obesity, unspecified: Secondary | ICD-10-CM

## 2013-04-15 DIAGNOSIS — L708 Other acne: Secondary | ICD-10-CM

## 2013-04-15 DIAGNOSIS — N921 Excessive and frequent menstruation with irregular cycle: Secondary | ICD-10-CM

## 2013-04-15 DIAGNOSIS — I10 Essential (primary) hypertension: Secondary | ICD-10-CM

## 2013-04-15 DIAGNOSIS — F411 Generalized anxiety disorder: Secondary | ICD-10-CM | POA: Insufficient documentation

## 2013-04-15 DIAGNOSIS — R0602 Shortness of breath: Secondary | ICD-10-CM

## 2013-04-15 DIAGNOSIS — F329 Major depressive disorder, single episode, unspecified: Secondary | ICD-10-CM

## 2013-04-15 DIAGNOSIS — N938 Other specified abnormal uterine and vaginal bleeding: Secondary | ICD-10-CM

## 2013-04-15 DIAGNOSIS — R079 Chest pain, unspecified: Secondary | ICD-10-CM

## 2013-04-15 DIAGNOSIS — F4310 Post-traumatic stress disorder, unspecified: Secondary | ICD-10-CM

## 2013-04-15 DIAGNOSIS — F315 Bipolar disorder, current episode depressed, severe, with psychotic features: Secondary | ICD-10-CM

## 2013-04-15 LAB — CBC WITH DIFFERENTIAL/PLATELET
BASOS ABS: 0 10*3/uL (ref 0.0–0.1)
Basophils Relative: 1 % (ref 0–1)
EOS PCT: 5 % (ref 0–5)
Eosinophils Absolute: 0.4 10*3/uL (ref 0.0–0.7)
HEMATOCRIT: 39.9 % (ref 36.0–46.0)
HEMOGLOBIN: 13.5 g/dL (ref 12.0–15.0)
Lymphocytes Relative: 26 % (ref 12–46)
Lymphs Abs: 1.7 10*3/uL (ref 0.7–4.0)
MCH: 28.8 pg (ref 26.0–34.0)
MCHC: 33.8 g/dL (ref 30.0–36.0)
MCV: 85.3 fL (ref 78.0–100.0)
Monocytes Absolute: 0.4 10*3/uL (ref 0.1–1.0)
Monocytes Relative: 7 % (ref 3–12)
Neutro Abs: 4.1 10*3/uL (ref 1.7–7.7)
Neutrophils Relative %: 62 % (ref 43–77)
Platelets: 212 10*3/uL (ref 150–400)
RBC: 4.68 MIL/uL (ref 3.87–5.11)
RDW: 14 % (ref 11.5–15.5)
WBC: 6.7 10*3/uL (ref 4.0–10.5)

## 2013-04-15 LAB — BASIC METABOLIC PANEL
BUN: 8 mg/dL (ref 6–23)
CHLORIDE: 102 meq/L (ref 96–112)
CO2: 25 meq/L (ref 19–32)
CREATININE: 0.51 mg/dL (ref 0.50–1.10)
Calcium: 9.1 mg/dL (ref 8.4–10.5)
GFR calc non Af Amer: >90 mL/min (ref >90)
GLUCOSE: 92 mg/dL (ref 70–99)
Potassium: 4.1 mEq/L (ref 3.7–5.3)
Sodium: 141 mEq/L (ref 137–147)

## 2013-04-15 LAB — PREGNANCY, URINE: Preg Test, Ur: NEGATIVE

## 2013-04-15 MED ORDER — HYDROMORPHONE HCL PF 1 MG/ML IJ SOLN
1.0000 mg | Freq: Once | INTRAMUSCULAR | Status: AC
  Administered 2013-04-15: 1 mg via INTRAVENOUS
  Filled 2013-04-15: qty 1

## 2013-04-15 MED ORDER — SODIUM CHLORIDE 0.9 % IV SOLN
INTRAVENOUS | Status: AC
  Administered 2013-04-15 – 2013-04-16 (×2): via INTRAVENOUS

## 2013-04-15 MED ORDER — SODIUM CHLORIDE 0.9 % IV BOLUS (SEPSIS)
1000.0000 mL | Freq: Once | INTRAVENOUS | Status: AC
  Administered 2013-04-15: 1000 mL via INTRAVENOUS

## 2013-04-15 MED ORDER — HYDROCHLOROTHIAZIDE 25 MG PO TABS
25.0000 mg | ORAL_TABLET | Freq: Every day | ORAL | Status: DC
  Administered 2013-04-16 – 2013-04-17 (×2): 25 mg via ORAL
  Filled 2013-04-15 (×2): qty 1

## 2013-04-15 MED ORDER — BUSPIRONE HCL 15 MG PO TABS
15.0000 mg | ORAL_TABLET | Freq: Three times a day (TID) | ORAL | Status: DC
  Administered 2013-04-15 – 2013-04-17 (×5): 15 mg via ORAL
  Filled 2013-04-15 (×7): qty 1

## 2013-04-15 MED ORDER — ONDANSETRON HCL 4 MG PO TABS: 4.0000 mg | ORAL_TABLET | Freq: Four times a day (QID) | ORAL | Status: DC | PRN

## 2013-04-15 MED ORDER — DIPHENHYDRAMINE HCL 50 MG/ML IJ SOLN
25.0000 mg | Freq: Once | INTRAMUSCULAR | Status: AC
  Administered 2013-04-15: 25 mg via INTRAVENOUS
  Filled 2013-04-15: qty 1

## 2013-04-15 MED ORDER — ACETAMINOPHEN 650 MG RE SUPP: 650.0000 mg | Freq: Four times a day (QID) | RECTAL | Status: DC | PRN

## 2013-04-15 MED ORDER — GABAPENTIN 300 MG PO CAPS
300.0000 mg | ORAL_CAPSULE | Freq: Three times a day (TID) | ORAL | Status: DC
  Administered 2013-04-15 – 2013-04-17 (×5): 300 mg via ORAL
  Filled 2013-04-15 (×7): qty 1

## 2013-04-15 MED ORDER — FENTANYL CITRATE 0.05 MG/ML IJ SOLN: 50.0000 ug | Freq: Once | INTRAMUSCULAR | Status: DC

## 2013-04-15 MED ORDER — HYDROMORPHONE HCL PF 1 MG/ML IJ SOLN
1.0000 mg | INTRAMUSCULAR | Status: DC | PRN
  Administered 2013-04-16 (×3): 1 mg via INTRAVENOUS
  Filled 2013-04-15 (×3): qty 1

## 2013-04-15 MED ORDER — ACETAMINOPHEN 325 MG PO TABS: 650.0000 mg | ORAL_TABLET | Freq: Four times a day (QID) | ORAL | Status: DC | PRN

## 2013-04-15 MED ORDER — ENOXAPARIN SODIUM 40 MG/0.4ML ~~LOC~~ SOLN
40.0000 mg | Freq: Every day | SUBCUTANEOUS | Status: DC
  Administered 2013-04-15 – 2013-04-16 (×2): 40 mg via SUBCUTANEOUS
  Filled 2013-04-15 (×3): qty 0.4

## 2013-04-15 MED ORDER — FENTANYL CITRATE 0.05 MG/ML IJ SOLN
75.0000 ug | Freq: Once | INTRAMUSCULAR | Status: AC
  Administered 2013-04-15: 75 ug via INTRAVENOUS
  Filled 2013-04-15: qty 2

## 2013-04-15 MED ORDER — SODIUM CHLORIDE 0.9 % IJ SOLN
3.0000 mL | Freq: Two times a day (BID) | INTRAMUSCULAR | Status: DC
  Administered 2013-04-15 – 2013-04-16 (×2): 3 mL via INTRAVENOUS

## 2013-04-15 MED ORDER — OXCARBAZEPINE 300 MG PO TABS: 600.0000 mg | ORAL_TABLET | Freq: Two times a day (BID) | ORAL | Status: DC

## 2013-04-15 MED ORDER — METOCLOPRAMIDE HCL 5 MG/ML IJ SOLN
10.0000 mg | Freq: Once | INTRAMUSCULAR | Status: AC
  Administered 2013-04-15: 10 mg via INTRAVENOUS
  Filled 2013-04-15: qty 2

## 2013-04-15 MED ORDER — HYDRALAZINE HCL 20 MG/ML IJ SOLN
10.0000 mg | INTRAMUSCULAR | Status: DC | PRN
  Administered 2013-04-16: 10 mg via INTRAVENOUS
  Filled 2013-04-15: qty 1

## 2013-04-15 MED ORDER — ONDANSETRON HCL 4 MG/2ML IJ SOLN
4.0000 mg | Freq: Four times a day (QID) | INTRAMUSCULAR | Status: DC | PRN
  Administered 2013-04-16: 4 mg via INTRAVENOUS
  Filled 2013-04-15: qty 2

## 2013-04-15 MED ORDER — OXCARBAZEPINE 300 MG PO TABS
600.0000 mg | ORAL_TABLET | Freq: Two times a day (BID) | ORAL | Status: DC
  Administered 2013-04-15 – 2013-04-17 (×4): 600 mg via ORAL
  Filled 2013-04-15 (×5): qty 2

## 2013-04-15 MED ORDER — QUETIAPINE FUMARATE ER 400 MG PO TB24
800.0000 mg | ORAL_TABLET | Freq: Every day | ORAL | Status: DC
  Administered 2013-04-15 – 2013-04-16 (×2): 800 mg via ORAL
  Filled 2013-04-15 (×3): qty 2

## 2013-04-15 MED ORDER — ONDANSETRON HCL 4 MG/2ML IJ SOLN: 4.0000 mg | Freq: Three times a day (TID) | INTRAMUSCULAR | Status: DC | PRN

## 2013-04-15 MED ORDER — IOHEXOL 350 MG/ML SOLN
80.0000 mL | Freq: Once | INTRAVENOUS | Status: AC | PRN
Start: 2013-04-15 — End: 2013-04-15
  Administered 2013-04-15: 80 mL via INTRAVENOUS

## 2013-04-15 MED ORDER — SERTRALINE HCL 50 MG PO TABS
150.0000 mg | ORAL_TABLET | Freq: Every day | ORAL | Status: DC
  Administered 2013-04-16 – 2013-04-17 (×2): 150 mg via ORAL
  Filled 2013-04-15 (×2): qty 1

## 2013-04-15 MED ORDER — HYDROXYZINE HCL 50 MG PO TABS
50.0000 mg | ORAL_TABLET | Freq: Three times a day (TID) | ORAL | Status: DC | PRN
  Filled 2013-04-15: qty 1

## 2013-04-15 MED ORDER — ONDANSETRON HCL 4 MG/2ML IJ SOLN: 4.0000 mg | Freq: Once | INTRAMUSCULAR | Status: DC

## 2013-04-15 NOTE — H&P (Signed)
[facility] History and Physical  Tina Saunders AOZ:[id number] DOB: July 01, 1985 DOA: 04/15/2013  Referring physician: ER physician. PCP: No PCP Per Patient   Chief Complaint: Right-sided vision loss.  HPI: Tina Saunders is a 28 y.o. 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 Patient has poor vision in the left eye since age 67. Patient started experiencing right-sided headache and neck pain for almost a week's time along with right-sided visual loss. At times patient also has temporal pounding headaches with some nausea and photophobia. Denies any vomiting loss of consciousness chest pain or shortness of breath. In the ER patient had CT angiogram of the head and neck which were showing subtle luminal irregularity which was followed by MRI of the brain which as per the neurologist did not show anything acute. Patient was given migraine cocktail in the ER and neurologist has advised the patient for further observation. Patient did not have any loss of function of the upper or lower extremities.  Review of Systems: As presented in the history of presenting illness, rest negative.  Past Medical History  Diagnosis Date  . Polycystic ovarian syndrome 07/01/2011    Patient report  . Anxiety   . Depression   . Cancer of abdominal wall   . Rhabdosarcoma   . Hypertension   . Obesity    Past Surgical History  Procedure Laterality Date  . Cholecystectomy    . Varicose vein surgery    . Ovarian cyst excision     Social History:  reports that she has never smoked. She has never used smokeless tobacco. She reports that she does not drink alcohol or use illicit drugs. Where does patient live home. Can patient participate in ADLs? Yes.  Allergies  Allergen Reactions  . Geodon [Ziprasidone Hcl] Other (See Comments)    Face  pulls, cant swallow - Locked Jaw  . Haldol [Haloperidol Lactate] Other (See Comments)    Face pulls, can't swallow - Locked Jaw  . Morphine And Related Hives, Itching and Other (See Comments)    GI upset    Family History:  Family History  Problem Relation Age of Onset  . Coronary artery disease Maternal Grandmother   . Diabetes type II Maternal Grandmother   . Cancer Maternal Grandmother   . Hypertension Mother   . Hypertension Father       Prior to Admission medications   Medication Sig Start Date End Date Taking? Authorizing Provider  acetaminophen (TYLENOL) 500 MG tablet Take 1,000 mg by mouth every 6 (six) hours as needed.   Yes Historical Provider, MD  albuterol (PROVENTIL HFA;VENTOLIN HFA) 108 (90 BASE) MCG/ACT inhaler Inhale 2 puffs into the lungs every 4 (four) hours as needed for wheezing or shortness of breath.   Yes Historical Provider, MD  busPIRone (BUSPAR) 15 MG tablet Take 15 mg by mouth 3 (three) times daily.   Yes Historical Provider, MD  gabapentin (NEURONTIN) 300 MG capsule Take 300 mg by mouth 3 (three) times daily.   Yes Historical Provider, MD  hydrochlorothiazide (HYDRODIURIL) 25 MG tablet Take 25 mg by mouth daily.   Yes Historical Provider, MD  hydrOXYzine (VISTARIL) 25 MG capsule Take 50 mg by mouth 3 (three) times daily as needed for anxiety.    Yes Historical Provider, MD  ibuprofen (ADVIL,MOTRIN) 200 MG tablet Take 200 mg by mouth every 6 (six) hours  as needed.   Yes Historical Provider, MD  oxcarbazepine (TRILEPTAL) 600 MG tablet Take 600 mg by mouth 2 (two) times daily.   Yes Historical Provider, MD  QUEtiapine (SEROQUEL XR) 400 MG 24 hr tablet Take 800 mg by mouth at bedtime.   Yes Historical Provider, MD  sertraline (ZOLOFT) 50 MG tablet Take 150 mg by mouth daily.   Yes Historical Provider, MD    Physical Exam: Filed Vitals:   04/15/13 1308 04/15/13 1310 04/15/13 1737 04/15/13 2128  BP:  157/108 144/83 140/89  Pulse:  100 88 89  Temp:  98.7 F  (37.1 C)  98.6 F (37 C)  TempSrc:  Oral  Oral  Resp:  16 20 20   Height:    5\' 4"  (1.626 m)  Weight: 121.11 kg (267 lb)   121.7 kg (268 lb 4.8 oz)  SpO2:  96% 97% 97%     General:  Well-developed well-nourished.  Eyes: Anicteric no pallor. Poor vision in the left eye. Patient unable to see in the right temporal field of the right eye.  ENT: No discharge from the ears eyes nose mouth.  Neck: No mass felt.  Cardiovascular: S1-S2 heard.  Respiratory: No rhonchi or crepitations.  Abdomen: Soft nontender bowel sounds present. No guarding or rigidity.  Skin: No rash.  Musculoskeletal: No edema.  Psychiatric: Appears normal.  Neurologic: As described in the eye findings. Moves all extremities 5 x 5. No facial symmetric. Tongue is midline.  Labs on Admission:  Basic Metabolic Panel:  Recent Labs Lab 04/15/13 1600  NA 141  K 4.1  CL 102  CO2 25  GLUCOSE 92  BUN 8  CREATININE 0.51  CALCIUM 9.1   Liver Function Tests: No results found for this basename: AST, ALT, ALKPHOS, BILITOT, PROT, ALBUMIN,  in the last 168 hours No results found for this basename: LIPASE, AMYLASE,  in the last 168 hours No results found for this basename: AMMONIA,  in the last 168 hours CBC:  Recent Labs Lab 04/15/13 1600  WBC 6.7  NEUTROABS 4.1  HGB 13.5  HCT 39.9  MCV 85.3  PLT 212   Cardiac Enzymes: No results found for this basename: CKTOTAL, CKMB, CKMBINDEX, TROPONINI,  in the last 168 hours  BNP (last 3 results)  Recent Labs  04/16/12 1352  PROBNP 43.8   CBG: No results found for this basename: GLUCAP,  in the last 168 hours  Radiological Exams on Admission: Ct Angio Head W/cm &/or Wo Cm  04/15/2013   CLINICAL DATA:  Headache and right temporal vision loss.  Neck pain.  EXAM: CT ANGIOGRAPHY HEAD AND NECK  TECHNIQUE: Multidetector CT imaging of the head and neck was performed using the standard protocol during bolus administration of intravenous contrast. Multiplanar CT  image reconstructions and MIPs were obtained to evaluate the vascular anatomy. Carotid stenosis measurements (when applicable) are obtained utilizing NASCET criteria, using the distal internal carotid diameter as the denominator.  CONTRAST:  54mL OMNIPAQUE IOHEXOL 350 MG/ML SOLN  COMPARISON:  Head CT 03/11/2012  FINDINGS: CTA HEAD FINDINGS  Skull and Sinuses:No acute osseous findings. Atelectatic right maxillary sinus with mild mucosal thickening.  Orbits: No acute abnormality.  Brain: No evidence of acute abnormality, such as acute infarction, hemorrhage, hydrocephalus, or mass lesion/mass effect. No abnormal parenchymal enhancement on delayed imaging.  Standard intracranial anatomy. No evidence of aneurysm, significant stenosis, or major vessel occlusion. No evidence of vasculopathy. There is early circumferential calcification of the right paraclinoid ICA.  Review of  the MIP images confirms the above findings.  CTA NECK FINDINGS  No aortic aneurysm or dissection. Branching pattern from the aorta is standard. There is no significant stenosis involving the carotid or vertebral systems. There is subtle luminal undulation along the right V2 segment, in an area of decreased resolution due to soft tissue attenuation. There is no flow limiting stenosis. Noted dissection flap. No pseudoaneurysm. No evidence of typical fibromuscular dysplasia. Early arterial calcification of the right V4 segment.  These results were called by telephone at the time of interpretation on 04/15/2013 at 5:20 PM to Dr. Elnora Morrison , who verbally acknowledged these results.  Review of the MIP images confirms the above findings.  IMPRESSION: 1. Subtle luminal irregularity of the right cervical vertebral artery is likely artifact (as opposed to dissection). No flow limiting stenosis. 2. No acute intracranial findings. 3. Intracranial arterial calcifications, likely early atherosclerosis.   Electronically Signed   By: Jorje Guild M.D.   On:  04/15/2013 17:29   Ct Angio Neck W/cm &/or Wo/cm  04/15/2013   CLINICAL DATA:  Headache and right temporal vision loss.  Neck pain.  EXAM: CT ANGIOGRAPHY HEAD AND NECK  TECHNIQUE: Multidetector CT imaging of the head and neck was performed using the standard protocol during bolus administration of intravenous contrast. Multiplanar CT image reconstructions and MIPs were obtained to evaluate the vascular anatomy. Carotid stenosis measurements (when applicable) are obtained utilizing NASCET criteria, using the distal internal carotid diameter as the denominator.  CONTRAST:  75mL OMNIPAQUE IOHEXOL 350 MG/ML SOLN  COMPARISON:  Head CT 03/11/2012  FINDINGS: CTA HEAD FINDINGS  Skull and Sinuses:No acute osseous findings. Atelectatic right maxillary sinus with mild mucosal thickening.  Orbits: No acute abnormality.  Brain: No evidence of acute abnormality, such as acute infarction, hemorrhage, hydrocephalus, or mass lesion/mass effect. No abnormal parenchymal enhancement on delayed imaging.  Standard intracranial anatomy. No evidence of aneurysm, significant stenosis, or major vessel occlusion. No evidence of vasculopathy. There is early circumferential calcification of the right paraclinoid ICA.  Review of the MIP images confirms the above findings.  CTA NECK FINDINGS  No aortic aneurysm or dissection. Branching pattern from the aorta is standard. There is no significant stenosis involving the carotid or vertebral systems. There is subtle luminal undulation along the right V2 segment, in an area of decreased resolution due to soft tissue attenuation. There is no flow limiting stenosis. Noted dissection flap. No pseudoaneurysm. No evidence of typical fibromuscular dysplasia. Early arterial calcification of the right V4 segment.  These results were called by telephone at the time of interpretation on 04/15/2013 at 5:20 PM to Dr. Elnora Morrison , who verbally acknowledged these results.  Review of the MIP images confirms the  above findings.  IMPRESSION: 1. Subtle luminal irregularity of the right cervical vertebral artery is likely artifact (as opposed to dissection). No flow limiting stenosis. 2. No acute intracranial findings. 3. Intracranial arterial calcifications, likely early atherosclerosis.   Electronically Signed   By: Jorje Guild M.D.   On: 04/15/2013 17:29   Mr Brain Wo Contrast  04/15/2013   CLINICAL DATA:  Headache and pain along right-sided neck extending into shoulder for the past week. Into min right-sided visual loss with headaches.  EXAM: MRI HEAD WITHOUT CONTRAST  TECHNIQUE: Multiplanar, multiecho pulse sequences of the brain and surrounding structures were obtained without intravenous contrast.  COMPARISON:  CT angiogram same day.  FINDINGS: No acute infarct.  No intracranial hemorrhage.  No intracranial mass lesion noted on this  unenhanced exam.  No hydrocephalus.  Major intracranial vascular structures are patent.  Small right maxillary sinus. Minimal mucosal thickening ethmoid sinus air cells and maxillary sinuses.  Cervical medullary junction, pituitary region, pineal region and orbital structures unremarkable.  IMPRESSION: Minimal paranasal sinus mucosal thickening otherwise negative unenhanced MR of the brain as noted above.   Electronically Signed   By: Chauncey Cruel M.D.   On: 04/15/2013 19:49    Assessment/Plan Principal Problem:   Vision loss of right eye Active Problems:   Headache   HTN (hypertension)   1. Headache with visual loss - most likely secondary to migraine. This time I have continued patient on Dilaudid for pain relief. Patient has been placed on neurochecks. Further recommendations per neurologist. Have discussed with on-call neurologist Dr. Nicole Kindred. 2. Hypertension - continue present medications. 3. History of rhabdosarcoma of the abdomen status post chemotherapy - presently in remission. 4. History of anxiety - continue present medications.  I have reviewed patient's  old charts and labs.  Code Status: Full code.  Family Communication: None.  Disposition Plan: Admit for observation.    Norris Bodley N. [facility] Pager [phone].  If 7PM-7AM, please contact night-coverage [email or web address] Password [email or web address] 04/15/2013, 10:14 PM

## 2013-04-15 NOTE — Consult Note (Signed)
Reason for Consult: Headache with visual changes involving right eye.  HPI:                                                                                                                                          Tina Saunders is an 28 y.o. female with a history of polycystic ovarian syndrome, chronic anxiety and depression, rhabdosarcoma, hypertension and obesity, presenting with right side headache and visual changes involving right eye. She's had a headache for about one week with varying severity. She's had also transient visual changes involving the right eye 1 headache has been severe. Visual changes been subsiding with headache severity. She has poor vision involving left thigh since childhood and was diagnosed with "lazy eye". Vision has always been extremely poor involving left eye. CT scan of her head showed no acute intracranial abnormality. CT angiogram of head and neck showed findings of irregularity involving right cervical vertebral artery which was thought to likely be artifact. Dissection could not be completely ruled out. MRI of the brain showed no acute intracranial abnormality including no sign of acute stroke involving posterior circulation territory. Patient said no focal weakness nor focal sensory changes. She's had no changes in speech as well. She indicates her blood pressure has been elevated with systolic pressures in the 408X and diastolics in the low 448J over the past one week when she's taken a blood pressure at home. Blood pressure in the emergency room here on arrival was 150 07/24/2006.  Past Medical History  Diagnosis Date  . Polycystic ovarian syndrome 07/01/2011    Patient report  . Anxiety   . Depression   . Cancer of abdominal wall   . Rhabdosarcoma   . Hypertension   . Obesity     Past Surgical History  Procedure Laterality Date  . Cholecystectomy    . Varicose vein surgery    . Ovarian cyst excision      Family History  Problem Relation Age of Onset   . Coronary artery disease Maternal Grandmother   . Diabetes type II Maternal Grandmother   . Cancer Maternal Grandmother   . Hypertension Mother   . Hypertension Father     Social History:  reports that she has never smoked. She has never used smokeless tobacco. She reports that she does not drink alcohol or use illicit drugs.  Allergies  Allergen Reactions  . Geodon [Ziprasidone Hcl] Other (See Comments)    Face pulls, cant swallow - Locked Jaw  . Haldol [Haloperidol Lactate] Other (See Comments)    Face pulls, can't swallow - Locked Jaw  . Morphine And Related Hives, Itching and Other (See Comments)    GI upset    MEDICATIONS:  I have reviewed the patient's current medications.   ROS:                                                                                                                                       History obtained from the patient  General ROS: negative for - chills, fatigue, fever, night sweats, weight gain or weight loss Psychological ROS: negative for - behavioral disorder, hallucinations, memory difficulties, mood swings or suicidal ideation Ophthalmic see history of present illness ENT ROS: negative for - epistaxis, nasal discharge, oral lesions, sore throat, tinnitus or vertigo Allergy and Immunology ROS: negative for - hives or itchy/watery eyes Hematological and Lymphatic ROS: negative for - bleeding problems, bruising or swollen lymph nodes Endocrine ROS: negative for - galactorrhea, hair pattern changes, polydipsia/polyuria or temperature intolerance Respiratory ROS: negative for - cough, hemoptysis, shortness of breath or wheezing Cardiovascular ROS: negative for - chest pain, dyspnea on exertion, edema or irregular heartbeat Gastrointestinal ROS: negative for - abdominal pain, diarrhea, hematemesis, nausea/vomiting or stool  incontinence Genito-Urinary ROS: negative for - dysuria, hematuria, incontinence or urinary frequency/urgency Musculoskeletal ROS: negative for - joint swelling or muscular weakness Neurological ROS: as noted in HPI Dermatological ROS: negative for rash and skin lesion changes   Blood pressure 144/83, pulse 88, temperature 98.7 F (37.1 C), temperature source Oral, resp. rate 20, weight 121.11 kg (267 lb), last menstrual period 02/16/2013, SpO2 97.00%.   Neurologic Examination:                                                                                                      Mental Status: Alert, oriented, thought content appropriate.  Speech fluent without evidence of aphasia. Able to follow commands without difficulty. Cranial Nerves: II-difficulty with finger counting involving nasal half of right eye vision. III/IV/VI-Pupils were equal and reacted. Extraocular movements were full and conjugate.    V/VII-no facial numbness and no facial weakness. VIII-normal. X-normal speech and symmetrical palatal movement. Motor: 5/5 bilaterally with normal tone and bulk Sensory: Slightly reduced perception of tactile sensation over right extremities compared to left. Deep Tendon Reflexes: 2+ and symmetric. Plantars: Flexor bilaterally Cerebellar: Normal finger-to-nose testing.  Lab Results  Component Value Date/Time   CHOL 137 04/26/2006  8:38 PM    Results for orders placed during the hospital encounter of 04/15/13 (from the past 48 hour(s))  BASIC METABOLIC PANEL     Status: None   Collection Time    04/15/13  4:00 PM  Result Value Ref Range   Sodium 141  137 - 147 mEq/L   Potassium 4.1  3.7 - 5.3 mEq/L   Chloride 102  96 - 112 mEq/L   CO2 25  19 - 32 mEq/L   Glucose, Bld 92  70 - 99 mg/dL   BUN 8  6 - 23 mg/dL   Creatinine, Ser 0.51  0.50 - 1.10 mg/dL   Calcium 9.1  8.4 - 10.5 mg/dL   GFR calc non Af Amer >90  >90 mL/min   GFR calc Af Amer >90  >90 mL/min   Comment: (NOTE)      The eGFR has been calculated using the CKD EPI equation.     This calculation has not been validated in all clinical situations.     eGFR's persistently <90 mL/min signify possible Chronic Kidney     Disease.  CBC WITH DIFFERENTIAL     Status: None   Collection Time    04/15/13  4:00 PM      Result Value Ref Range   WBC 6.7  4.0 - 10.5 K/uL   RBC 4.68  3.87 - 5.11 MIL/uL   Hemoglobin 13.5  12.0 - 15.0 g/dL   HCT 39.9  36.0 - 46.0 %   MCV 85.3  78.0 - 100.0 fL   MCH 28.8  26.0 - 34.0 pg   MCHC 33.8  30.0 - 36.0 g/dL   RDW 14.0  11.5 - 15.5 %   Platelets 212  150 - 400 K/uL   Neutrophils Relative % 62  43 - 77 %   Neutro Abs 4.1  1.7 - 7.7 K/uL   Lymphocytes Relative 26  12 - 46 %   Lymphs Abs 1.7  0.7 - 4.0 K/uL   Monocytes Relative 7  3 - 12 %   Monocytes Absolute 0.4  0.1 - 1.0 K/uL   Eosinophils Relative 5  0 - 5 %   Eosinophils Absolute 0.4  0.0 - 0.7 K/uL   Basophils Relative 1  0 - 1 %   Basophils Absolute 0.0  0.0 - 0.1 K/uL  PREGNANCY, URINE     Status: None   Collection Time    04/15/13  4:00 PM      Result Value Ref Range   Preg Test, Ur NEGATIVE  NEGATIVE   Comment:            THE SENSITIVITY OF THIS     METHODOLOGY IS >20 mIU/mL.    Ct Angio Head W/cm &/or Wo Cm  04/15/2013   CLINICAL DATA:  Headache and right temporal vision loss.  Neck pain.  EXAM: CT ANGIOGRAPHY HEAD AND NECK  TECHNIQUE: Multidetector CT imaging of the head and neck was performed using the standard protocol during bolus administration of intravenous contrast. Multiplanar CT image reconstructions and MIPs were obtained to evaluate the vascular anatomy. Carotid stenosis measurements (when applicable) are obtained utilizing NASCET criteria, using the distal internal carotid diameter as the denominator.  CONTRAST:  78m OMNIPAQUE IOHEXOL 350 MG/ML SOLN  COMPARISON:  Head CT 03/11/2012  FINDINGS: CTA HEAD FINDINGS  Skull and Sinuses:No acute osseous findings. Atelectatic right maxillary sinus with  mild mucosal thickening.  Orbits: No acute abnormality.  Brain: No evidence of acute abnormality, such as acute infarction, hemorrhage, hydrocephalus, or mass lesion/mass effect. No abnormal parenchymal enhancement on delayed imaging.  Standard intracranial anatomy. No evidence of aneurysm, significant stenosis, or major vessel occlusion. No evidence of vasculopathy. There is early circumferential calcification of  the right paraclinoid ICA.  Review of the MIP images confirms the above findings.  CTA NECK FINDINGS  No aortic aneurysm or dissection. Branching pattern from the aorta is standard. There is no significant stenosis involving the carotid or vertebral systems. There is subtle luminal undulation along the right V2 segment, in an area of decreased resolution due to soft tissue attenuation. There is no flow limiting stenosis. Noted dissection flap. No pseudoaneurysm. No evidence of typical fibromuscular dysplasia. Early arterial calcification of the right V4 segment.  These results were called by telephone at the time of interpretation on 04/15/2013 at 5:20 PM to Dr. Elnora Morrison , who verbally acknowledged these results.  Review of the MIP images confirms the above findings.  IMPRESSION: 1. Subtle luminal irregularity of the right cervical vertebral artery is likely artifact (as opposed to dissection). No flow limiting stenosis. 2. No acute intracranial findings. 3. Intracranial arterial calcifications, likely early atherosclerosis.   Electronically Signed   By: Jorje Guild M.D.   On: 04/15/2013 17:29   Ct Angio Neck W/cm &/or Wo/cm  04/15/2013   CLINICAL DATA:  Headache and right temporal vision loss.  Neck pain.  EXAM: CT ANGIOGRAPHY HEAD AND NECK  TECHNIQUE: Multidetector CT imaging of the head and neck was performed using the standard protocol during bolus administration of intravenous contrast. Multiplanar CT image reconstructions and MIPs were obtained to evaluate the vascular anatomy. Carotid  stenosis measurements (when applicable) are obtained utilizing NASCET criteria, using the distal internal carotid diameter as the denominator.  CONTRAST:  12m OMNIPAQUE IOHEXOL 350 MG/ML SOLN  COMPARISON:  Head CT 03/11/2012  FINDINGS: CTA HEAD FINDINGS  Skull and Sinuses:No acute osseous findings. Atelectatic right maxillary sinus with mild mucosal thickening.  Orbits: No acute abnormality.  Brain: No evidence of acute abnormality, such as acute infarction, hemorrhage, hydrocephalus, or mass lesion/mass effect. No abnormal parenchymal enhancement on delayed imaging.  Standard intracranial anatomy. No evidence of aneurysm, significant stenosis, or major vessel occlusion. No evidence of vasculopathy. There is early circumferential calcification of the right paraclinoid ICA.  Review of the MIP images confirms the above findings.  CTA NECK FINDINGS  No aortic aneurysm or dissection. Branching pattern from the aorta is standard. There is no significant stenosis involving the carotid or vertebral systems. There is subtle luminal undulation along the right V2 segment, in an area of decreased resolution due to soft tissue attenuation. There is no flow limiting stenosis. Noted dissection flap. No pseudoaneurysm. No evidence of typical fibromuscular dysplasia. Early arterial calcification of the right V4 segment.  These results were called by telephone at the time of interpretation on 04/15/2013 at 5:20 PM to Dr. JElnora Morrison, who verbally acknowledged these results.  Review of the MIP images confirms the above findings.  IMPRESSION: 1. Subtle luminal irregularity of the right cervical vertebral artery is likely artifact (as opposed to dissection). No flow limiting stenosis. 2. No acute intracranial findings. 3. Intracranial arterial calcifications, likely early atherosclerosis.   Electronically Signed   By: JJorje GuildM.D.   On: 04/15/2013 17:29   Mr Brain Wo Contrast  04/15/2013   CLINICAL DATA:  Headache and  pain along right-sided neck extending into shoulder for the past week. Into min right-sided visual loss with headaches.  EXAM: MRI HEAD WITHOUT CONTRAST  TECHNIQUE: Multiplanar, multiecho pulse sequences of the brain and surrounding structures were obtained without intravenous contrast.  COMPARISON:  CT angiogram same day.  FINDINGS: No acute infarct.  No intracranial hemorrhage.  No intracranial mass lesion noted on this unenhanced exam.  No hydrocephalus.  Major intracranial vascular structures are patent.  Small right maxillary sinus. Minimal mucosal thickening ethmoid sinus air cells and maxillary sinuses.  Cervical medullary junction, pituitary region, pineal region and orbital structures unremarkable.  IMPRESSION: Minimal paranasal sinus mucosal thickening otherwise negative unenhanced MR of the brain as noted above.   Electronically Signed   By: Chauncey Cruel M.D.   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 MRI showed no signs of acute ischemic posterior circulation territory stroke. There also no signs of PRES, although blood pressure apparently has been frequently elevated.  Recommendations: 1. Symptomatic treatment of headache as needed including parenteral analgesics if necessary. 2. No further neurodiagnostic studies are indicated at this time. 3. Management of blood pressure per primary care team.  We will continue to follow this patient with you.  C.R. Nicole Kindred, MD [facility] [phone]  04/15/2013, 8:55 PM

## 2013-04-15 NOTE — ED Notes (Signed)
Pt reports ongoing headache and pain along the right side of her neck to shoulder for about the last week. States no improvement with ibuprofen. Denies fever. Reports intermittent right sided vision loss while having headache. Pt awake, alert, oriented x4, MAE, no slurred speech, facial weakness, droop or drift noted.

## 2013-04-15 NOTE — ED Provider Notes (Signed)
CSN: [id number]     Arrival date & time 04/15/13  1305 History   First MD Initiated Contact with Patient 04/15/13 1502     Chief Complaint  Patient presents with  . Headache  . Neck Pain     (Consider location/radiation/quality/duration/timing/severity/associated sxs/prior Treatment) HPI Comments: 28 year old female with a history of polycystic ovarian disease depression bipolar posttraumatic stress disorder rhabdomyosarcoma in remission presents with right-sided headache and right visual field loss. She has had this intermittent for one week. It is gradual onset and gradually improves. No history of similar no history of migraines denies aneurysm or bleeding disorders. Mild photophobia. Pain improves with time. Patient also has a right neck pain that occurs with that is tender to palpation. Without injury. Patient has a history of minimal vision out of her left eye which has been worked up in the past per her period with no known cause it is the right lateral visual field that is new.  Patient is a 28 y.o. female presenting with headaches and neck pain. The history is provided by the patient.  Headache Associated symptoms: neck pain and photophobia   Associated symptoms: no abdominal pain, no back pain, no congestion, no fever, no neck stiffness, no numbness and no vomiting   Neck Pain Associated symptoms: headaches and photophobia   Associated symptoms: no chest pain, no fever, no numbness and no weakness     Past Medical History  Diagnosis Date  . Polycystic ovarian syndrome 07/01/2011    Patient report  . Anxiety   . Depression   . Cancer of abdominal wall   . Rhabdosarcoma   . Hypertension   . Diabetes mellitus without complication   . Obesity    Past Surgical History  Procedure Laterality Date  . Cholecystectomy    . Varicose vein surgery    . Ovarian cyst excision     Family History  Problem Relation Age of Onset  . Coronary artery disease Maternal Grandmother   .  Diabetes type II Maternal Grandmother   . Cancer Maternal Grandmother   . Hypertension Mother   . Hypertension Father    History  Substance Use Topics  . Smoking status: Never Smoker   . Smokeless tobacco: Never Used  . Alcohol Use: No   OB History   Grav Para Term Preterm Abortions TAB SAB Ect Mult Living   0              Review of Systems  Constitutional: Negative for fever and chills.  HENT: Negative for congestion.   Eyes: Positive for photophobia and visual disturbance.  Respiratory: Negative for shortness of breath.   Cardiovascular: Negative for chest pain.  Gastrointestinal: Negative for vomiting and abdominal pain.  Genitourinary: Negative for dysuria and flank pain.  Musculoskeletal: Positive for neck pain. Negative for back pain and neck stiffness.  Skin: Negative for rash.  Neurological: Positive for headaches. Negative for weakness, light-headedness and numbness.      Allergies  Geodon; Haldol; and Morphine and related  Home Medications   Current Outpatient Rx  Name  Route  Sig  Dispense  Refill  . acetaminophen (TYLENOL) 500 MG tablet   Oral   Take 1,000 mg by mouth every 6 (six) hours as needed.         Marland Kitchen albuterol (PROVENTIL HFA;VENTOLIN HFA) 108 (90 BASE) MCG/ACT inhaler   Inhalation   Inhale 2 puffs into the lungs every 4 (four) hours as needed for wheezing or shortness of breath.         Marland Kitchen  busPIRone (BUSPAR) 15 MG tablet   Oral   Take 15 mg by mouth 3 (three) times daily.         Marland Kitchen gabapentin (NEURONTIN) 300 MG capsule   Oral   Take 300 mg by mouth 3 (three) times daily.         . hydrochlorothiazide (HYDRODIURIL) 25 MG tablet   Oral   Take 25 mg by mouth daily.         . hydrOXYzine (VISTARIL) 25 MG capsule   Oral   Take 50 mg by mouth 3 (three) times daily as needed for anxiety.          Marland Kitchen ibuprofen (ADVIL,MOTRIN) 200 MG tablet   Oral   Take 200 mg by mouth every 6 (six) hours as needed.         Marland Kitchen oxcarbazepine  (TRILEPTAL) 600 MG tablet   Oral   Take 600 mg by mouth 2 (two) times daily.         . QUEtiapine (SEROQUEL XR) 400 MG 24 hr tablet   Oral   Take 800 mg by mouth at bedtime.         . sertraline (ZOLOFT) 50 MG tablet   Oral   Take 150 mg by mouth daily.          BP 157/108  Pulse 100  Temp(Src) 98.7 F (37.1 C) (Oral)  Resp 16  Wt 267 lb (121.11 kg)  SpO2 96%  LMP 02/16/2013 Physical Exam  Nursing note and vitals reviewed. Constitutional: She is oriented to person, place, and time. She appears well-developed and well-nourished.  HENT:  Head: Normocephalic and atraumatic.  Eyes: Conjunctivae are normal. Right eye exhibits no discharge. Left eye exhibits no discharge.  Neck: Normal range of motion. Neck supple. No tracheal deviation present.  Cardiovascular: Normal rate and regular rhythm.   Pulmonary/Chest: Effort normal and breath sounds normal.  Abdominal: Soft. She exhibits no distension. There is no tenderness. There is no guarding.  Musculoskeletal: She exhibits no edema.  Neurological: She is alert and oriented to person, place, and time. GCS eye subscore is 4. GCS verbal subscore is 5. GCS motor subscore is 6.  Reflex Scores:      Bicep reflexes are 2+ on the right side and 2+ on the left side. 5+ strength in UE and LE with f/e at major joints. Sensation to palpation intact in UE and LE. CNs 2-12 grossly intact.  EOMFI.  PERRL.   Finger nose and coordination intact bilateral.   Visual fields minimal vision left eye bilateral visual fields, chronic per pt, right eye pt unable to see nasal half of visual field No papilledema visualized however limited due to photosensitivity   Skin: Skin is warm. No rash noted.  Psychiatric: She has a normal mood and affect.    ED Course  Procedures (including critical care time) Labs Review Labs Reviewed  BASIC METABOLIC PANEL  CBC WITH DIFFERENTIAL  PREGNANCY, URINE  COMPREHENSIVE METABOLIC PANEL  CBC WITH  DIFFERENTIAL  URINE RAPID DRUG SCREEN (HOSP PERFORMED)   Imaging Review Ct Angio Head W/cm &/or Wo Cm  04/15/2013   CLINICAL DATA:  Headache and right temporal vision loss.  Neck pain.  EXAM: CT ANGIOGRAPHY HEAD AND NECK  TECHNIQUE: Multidetector CT imaging of the head and neck was performed using the standard protocol during bolus administration of intravenous contrast. Multiplanar CT image reconstructions and MIPs were obtained to evaluate the vascular anatomy. Carotid stenosis measurements (when applicable) are obtained  utilizing NASCET criteria, using the distal internal carotid diameter as the denominator.  CONTRAST:  80mL OMNIPAQUE IOHEXOL 350 MG/ML SOLN  COMPARISON:  Head CT 03/11/2012  FINDINGS: CTA HEAD FINDINGS  Skull and Sinuses:No acute osseous findings. Atelectatic right maxillary sinus with mild mucosal thickening.  Orbits: No acute abnormality.  Brain: No evidence of acute abnormality, such as acute infarction, hemorrhage, hydrocephalus, or mass lesion/mass effect. No abnormal parenchymal enhancement on delayed imaging.  Standard intracranial anatomy. No evidence of aneurysm, significant stenosis, or major vessel occlusion. No evidence of vasculopathy. There is early circumferential calcification of the right paraclinoid ICA.  Review of the MIP images confirms the above findings.  CTA NECK FINDINGS  No aortic aneurysm or dissection. Branching pattern from the aorta is standard. There is no significant stenosis involving the carotid or vertebral systems. There is subtle luminal undulation along the right V2 segment, in an area of decreased resolution due to soft tissue attenuation. There is no flow limiting stenosis. Noted dissection flap. No pseudoaneurysm. No evidence of typical fibromuscular dysplasia. Early arterial calcification of the right V4 segment.  These results were called by telephone at the time of interpretation on 04/15/2013 at 5:20 PM to Dr. Elnora Morrison , who verbally  acknowledged these results.  Review of the MIP images confirms the above findings.  IMPRESSION: 1. Subtle luminal irregularity of the right cervical vertebral artery is likely artifact (as opposed to dissection). No flow limiting stenosis. 2. No acute intracranial findings. 3. Intracranial arterial calcifications, likely early atherosclerosis.   Electronically Signed   By: Jorje Guild M.D.   On: 04/15/2013 17:29   Ct Angio Neck W/cm &/or Wo/cm  04/15/2013   CLINICAL DATA:  Headache and right temporal vision loss.  Neck pain.  EXAM: CT ANGIOGRAPHY HEAD AND NECK  TECHNIQUE: Multidetector CT imaging of the head and neck was performed using the standard protocol during bolus administration of intravenous contrast. Multiplanar CT image reconstructions and MIPs were obtained to evaluate the vascular anatomy. Carotid stenosis measurements (when applicable) are obtained utilizing NASCET criteria, using the distal internal carotid diameter as the denominator.  CONTRAST:  30mL OMNIPAQUE IOHEXOL 350 MG/ML SOLN  COMPARISON:  Head CT 03/11/2012  FINDINGS: CTA HEAD FINDINGS  Skull and Sinuses:No acute osseous findings. Atelectatic right maxillary sinus with mild mucosal thickening.  Orbits: No acute abnormality.  Brain: No evidence of acute abnormality, such as acute infarction, hemorrhage, hydrocephalus, or mass lesion/mass effect. No abnormal parenchymal enhancement on delayed imaging.  Standard intracranial anatomy. No evidence of aneurysm, significant stenosis, or major vessel occlusion. No evidence of vasculopathy. There is early circumferential calcification of the right paraclinoid ICA.  Review of the MIP images confirms the above findings.  CTA NECK FINDINGS  No aortic aneurysm or dissection. Branching pattern from the aorta is standard. There is no significant stenosis involving the carotid or vertebral systems. There is subtle luminal undulation along the right V2 segment, in an area of decreased resolution due  to soft tissue attenuation. There is no flow limiting stenosis. Noted dissection flap. No pseudoaneurysm. No evidence of typical fibromuscular dysplasia. Early arterial calcification of the right V4 segment.  These results were called by telephone at the time of interpretation on 04/15/2013 at 5:20 PM to Dr. Elnora Morrison , who verbally acknowledged these results.  Review of the MIP images confirms the above findings.  IMPRESSION: 1. Subtle luminal irregularity of the right cervical vertebral artery is likely artifact (as opposed to dissection). No flow limiting stenosis. 2.  No acute intracranial findings. 3. Intracranial arterial calcifications, likely early atherosclerosis.   Electronically Signed   By: Jorje Guild M.D.   On: 04/15/2013 17:29   Mr Brain Wo Contrast  04/15/2013   CLINICAL DATA:  Headache and pain along right-sided neck extending into shoulder for the past week. Into min right-sided visual loss with headaches.  EXAM: MRI HEAD WITHOUT CONTRAST  TECHNIQUE: Multiplanar, multiecho pulse sequences of the brain and surrounding structures were obtained without intravenous contrast.  COMPARISON:  CT angiogram same day.  FINDINGS: No acute infarct.  No intracranial hemorrhage.  No intracranial mass lesion noted on this unenhanced exam.  No hydrocephalus.  Major intracranial vascular structures are patent.  Small right maxillary sinus. Minimal mucosal thickening ethmoid sinus air cells and maxillary sinuses.  Cervical medullary junction, pituitary region, pineal region and orbital structures unremarkable.  IMPRESSION: Minimal paranasal sinus mucosal thickening otherwise negative unenhanced MR of the brain as noted above.   Electronically Signed   By: Chauncey Cruel M.D.   On: 04/15/2013 19:49     EKG Interpretation None      MDM   Final diagnoses:  Vision loss of right eye  Headache  High blood pressure   Patient with new onset headaches the past week gradual onset no signs of meningitis  Vertell Limber for differential including pseudotumor cerebri, aneurysm, migraine or other etiology. Plan for CT angiogram head and neck with and without contrast and migraine treatments.  CT angio possible small dissection, spoke with radiologist.   Damaris Schooner with neuro re differential and further eval, rec MRI brain.  No neuro changes on recheck. MRI no acute findings. Pain medicines/ nausea meds repeated.  Mild improvement in ED however HA persists.    Visual field deficit persists, neuro rec observation, spoke with [facility].  The patients results and plan were reviewed and discussed.   Any x-rays performed were personally reviewed by myself.   Differential diagnosis were considered with the presenting HPI.    Filed Vitals:   04/15/13 1737 04/15/13 2128 04/15/13 2200 04/16/13 0000  BP: 144/83 140/89 153/86 156/102  Pulse: 88 89 90 87  Temp:  98.6 F (37 C) 97.9 F (36.6 C) 97.9 F (36.6 C)  TempSrc:  Oral Oral Oral  Resp: 20 20 20 20   Height:  5\' 4"  (1.626 m)    Weight:  268 lb 4.8 oz (121.7 kg)  268 lb 4.8 oz (121.7 kg)  SpO2: 97% 97% 94% 94%    Admission/ observation were discussed with the admitting physician, patient and/or family and they are comfortable with the plan.     Mariea Clonts, MD 04/16/13 [phone]

## 2013-04-15 NOTE — ED Notes (Addendum)
PT HAS ARRIVED ON POD C TO AWAIT MRI. SHE HAS ARRIVED BY WHEELCHAIR. TRANSPORT HERE TO TAKE HER TO MRI.

## 2013-04-16 DIAGNOSIS — R030 Elevated blood-pressure reading, without diagnosis of hypertension: Secondary | ICD-10-CM

## 2013-04-16 LAB — CBC WITH DIFFERENTIAL/PLATELET
BASOS PCT: 0 % (ref 0–1)
Basophils Absolute: 0 10*3/uL (ref 0.0–0.1)
Eosinophils Absolute: 0.4 10*3/uL (ref 0.0–0.7)
Eosinophils Relative: 8 % — ABNORMAL HIGH (ref 0–5)
HCT: 37.6 % (ref 36.0–46.0)
HEMOGLOBIN: 12.8 g/dL (ref 12.0–15.0)
LYMPHS PCT: 38 % (ref 12–46)
Lymphs Abs: 2.1 10*3/uL (ref 0.7–4.0)
MCH: 29.2 pg (ref 26.0–34.0)
MCHC: 34 g/dL (ref 30.0–36.0)
MCV: 85.6 fL (ref 78.0–100.0)
MONO ABS: 0.4 10*3/uL (ref 0.1–1.0)
MONOS PCT: 6 % (ref 3–12)
NEUTROS PCT: 47 % (ref 43–77)
Neutro Abs: 2.7 10*3/uL (ref 1.7–7.7)
Platelets: 188 10*3/uL (ref 150–400)
RBC: 4.39 MIL/uL (ref 3.87–5.11)
RDW: 14 % (ref 11.5–15.5)
WBC: 5.6 10*3/uL (ref 4.0–10.5)

## 2013-04-16 LAB — COMPREHENSIVE METABOLIC PANEL
ALT: 39 U/L — ABNORMAL HIGH (ref 0–35)
AST: 62 U/L — ABNORMAL HIGH (ref 0–37)
Albumin: 3.2 g/dL — ABNORMAL LOW (ref 3.5–5.2)
Alkaline Phosphatase: 82 U/L (ref 39–117)
BUN: 7 mg/dL (ref 6–23)
CALCIUM: 8.4 mg/dL (ref 8.4–10.5)
CO2: 24 mEq/L (ref 19–32)
Chloride: 104 mEq/L (ref 96–112)
Creatinine, Ser: 0.46 mg/dL — ABNORMAL LOW (ref 0.50–1.10)
GFR calc non Af Amer: >90 mL/min (ref >90)
GLUCOSE: 104 mg/dL — AB (ref 70–99)
Potassium: 4 mEq/L (ref 3.7–5.3)
SODIUM: 140 meq/L (ref 137–147)
Total Bilirubin: 0.2 mg/dL — ABNORMAL LOW (ref 0.3–1.2)
Total Protein: 6.4 g/dL (ref 6.0–8.3)

## 2013-04-16 LAB — SEDIMENTATION RATE: Sed Rate: 23 mm/hr — ABNORMAL HIGH (ref 0–22)

## 2013-04-16 LAB — C-REACTIVE PROTEIN: CRP: 0.8 mg/dL — AB (ref <0.60)

## 2013-04-16 MED ORDER — VALPROATE SODIUM 500 MG/5ML IV SOLN
500.0000 mg | Freq: Once | INTRAVENOUS | Status: AC
  Administered 2013-04-16: 500 mg via INTRAVENOUS
  Filled 2013-04-16: qty 5

## 2013-04-16 MED ORDER — KETOROLAC TROMETHAMINE 30 MG/ML IJ SOLN
30.0000 mg | Freq: Three times a day (TID) | INTRAMUSCULAR | Status: DC | PRN
  Administered 2013-04-16: 30 mg via INTRAVENOUS
  Filled 2013-04-16: qty 1

## 2013-04-16 MED ORDER — DIPHENHYDRAMINE HCL 50 MG/ML IJ SOLN
25.0000 mg | Freq: Three times a day (TID) | INTRAMUSCULAR | Status: DC | PRN
  Administered 2013-04-16: 25 mg via INTRAVENOUS
  Filled 2013-04-16: qty 1

## 2013-04-16 MED ORDER — KETOROLAC TROMETHAMINE 30 MG/ML IJ SOLN
30.0000 mg | Freq: Once | INTRAMUSCULAR | Status: AC
  Administered 2013-04-16: 30 mg via INTRAVENOUS
  Filled 2013-04-16: qty 1

## 2013-04-16 MED ORDER — METOCLOPRAMIDE HCL 5 MG/ML IJ SOLN: 10.0000 mg | Freq: Three times a day (TID) | INTRAMUSCULAR | Status: DC

## 2013-04-16 MED ORDER — METOCLOPRAMIDE HCL 5 MG/ML IJ SOLN
10.0000 mg | Freq: Three times a day (TID) | INTRAMUSCULAR | Status: DC | PRN
  Administered 2013-04-16: 10 mg via INTRAVENOUS
  Filled 2013-04-16 (×2): qty 2

## 2013-04-16 MED ORDER — METOCLOPRAMIDE HCL 5 MG/ML IJ SOLN
10.0000 mg | Freq: Once | INTRAMUSCULAR | Status: AC
  Administered 2013-04-16: 10 mg via INTRAVENOUS
  Filled 2013-04-16: qty 2

## 2013-04-16 MED ORDER — DIPHENHYDRAMINE HCL 50 MG/ML IJ SOLN
25.0000 mg | Freq: Once | INTRAMUSCULAR | Status: AC
  Administered 2013-04-16: 25 mg via INTRAVENOUS
  Filled 2013-04-16: qty 1

## 2013-04-16 MED ORDER — DIHYDROERGOTAMINE MESYLATE 1 MG/ML IJ SOLN: 0.5000 mg | Freq: Three times a day (TID) | INTRAMUSCULAR | Status: DC

## 2013-04-16 NOTE — Progress Notes (Addendum)
Subjective: Headache contineus. It is unilateral on the right side, from the back of her head all the way to the front retro-orbital area. She has decreased vision on left as well as tingling on the right hand.  Exam: Filed Vitals:   04/16/13 1200  BP: 128/76  Pulse: 101  Temp: 97.6 F (36.4 C)  Resp: 18   Gen: In bed, NAD MS: Awake, alert, interactive and appropriate CN: Decrease field on the left in the right eye.  Motor: MAEW, possible mild right arm weakness Sensory:decreased on right arm.    Impression: 28 year old female with throbbing headache on the right side associated with photophobia and nausea. If the luminal irregularity of the vertebral artery was a dissection causing her symptoms, then there would be expected to be a stroke, or if she is having recurrent TIAs that it would be in multiple distributions and therefore I think it is very unlikely that this represents anything other than artifact which is the most likely finding according to the radiologist.  The description of the headache and positive symptoms of tingling as well as vision loss are very characteristic for migraine. At this time we'll continue to treat this as migraine. Would be cautious of DHE given the prolonged aura.   Recommendations: 1) Toradol, Reglan, benadryl. Q8H PRN 2) depakote 500mg  IV x 1.    Roland Rack, MD [facility] [phone]  If 7pm- 7am, please page neurology on call as listed in [place].

## 2013-04-16 NOTE — Progress Notes (Signed)
Patient ID: Tina Saunders  female  HYW:[id number]    DOB: 12/04/1985    DOA: 04/15/2013  PCP: No PCP Per Patient  Assessment/Plan: Principal Problem:   Vision loss of right eye with headaches: Patient states that her symptoms have not significantly improved since admission - MRI negative for any acute CVA - continue with pain control - follow neuro recommendations, outpatient ophthalmology followup   Active Problems:   Headache - Continue pain control for now    HTN (hypertension) - Likely elevated due to headache, otherwise not on any antihypertensives outpatient  History of anxiety - Continue current medications  DVT Prophylaxis:  Code Status:  Family Communication:  Disposition:Likely home in a.m., await further neurological conditions regarding migraine headaches  Consultants:  Neurology  Procedures:  MRI of the brain  Antibiotics:  none    Subjective: Continues to complain of headache, poor historian, requesting for pain medications Patient noticed to be ambulating in the room without any difficulty 10 minutes later  Objective: Weight change:  No intake or output data in the 24 hours ending 04/16/13 1217 Blood pressure 129/69, pulse 101, temperature 98.2 F (36.8 C), temperature source Oral, resp. rate 18, height 5\' 4"  (1.626 m), weight 121.7 kg (268 lb 4.8 oz), last menstrual period 02/16/2013, SpO2 95.00%.  Physical Exam: General: Alert and awake, oriented x3, not in any acute distress. CVS: S1-S2 clear, no murmur rubs or gallops Chest: clear to auscultation bilaterally, no wheezing, rales or rhonchi Abdomen: soft nontender, nondistended, normal bowel sounds  Extremities: no cyanosis, clubbing or edema noted bilaterally Neuro: Cranial nerves II-XII intact, no focal neurological deficits  Lab Results: Basic Metabolic Panel:  Recent Labs Lab 04/15/13 1600 04/16/13 0501  NA 141 140  K 4.1 4.0  CL 102 104  CO2 25 24  GLUCOSE 92 104*  BUN 8  7  CREATININE 0.51 0.46*  CALCIUM 9.1 8.4   Liver Function Tests:  Recent Labs Lab 04/16/13 0501  AST 62*  ALT 39*  ALKPHOS 82  BILITOT 0.2*  PROT 6.4  ALBUMIN 3.2*   No results found for this basename: LIPASE, AMYLASE,  in the last 168 hours No results found for this basename: AMMONIA,  in the last 168 hours CBC:  Recent Labs Lab 04/15/13 1600 04/16/13 0501  WBC 6.7 5.6  NEUTROABS 4.1 2.7  HGB 13.5 12.8  HCT 39.9 37.6  MCV 85.3 85.6  PLT 212 188   Cardiac Enzymes: No results found for this basename: CKTOTAL, CKMB, CKMBINDEX, TROPONINI,  in the last 168 hours BNP: No components found with this basename: POCBNP,  CBG: No results found for this basename: GLUCAP,  in the last 168 hours   Micro Results: No results found for this or any previous visit (from the past 240 hour(s)).  Studies/Results: Ct Angio Head W/cm &/or Wo Cm  04/15/2013   CLINICAL DATA:  Headache and right temporal vision loss.  Neck pain.  EXAM: CT ANGIOGRAPHY HEAD AND NECK  TECHNIQUE: Multidetector CT imaging of the head and neck was performed using the standard protocol during bolus administration of intravenous contrast. Multiplanar CT image reconstructions and MIPs were obtained to evaluate the vascular anatomy. Carotid stenosis measurements (when applicable) are obtained utilizing NASCET criteria, using the distal internal carotid diameter as the denominator.  CONTRAST:  97mL OMNIPAQUE IOHEXOL 350 MG/ML SOLN  COMPARISON:  Head CT 03/11/2012  FINDINGS: CTA HEAD FINDINGS  Skull and Sinuses:No acute osseous findings. Atelectatic right maxillary sinus with mild mucosal thickening.  Orbits: No acute abnormality.  Brain: No evidence of acute abnormality, such as acute infarction, hemorrhage, hydrocephalus, or mass lesion/mass effect. No abnormal parenchymal enhancement on delayed imaging.  Standard intracranial anatomy. No evidence of aneurysm, significant stenosis, or major vessel occlusion. No evidence of  vasculopathy. There is early circumferential calcification of the right paraclinoid ICA.  Review of the MIP images confirms the above findings.  CTA NECK FINDINGS  No aortic aneurysm or dissection. Branching pattern from the aorta is standard. There is no significant stenosis involving the carotid or vertebral systems. There is subtle luminal undulation along the right V2 segment, in an area of decreased resolution due to soft tissue attenuation. There is no flow limiting stenosis. Noted dissection flap. No pseudoaneurysm. No evidence of typical fibromuscular dysplasia. Early arterial calcification of the right V4 segment.  These results were called by telephone at the time of interpretation on 04/15/2013 at 5:20 PM to Dr. Elnora Morrison , who verbally acknowledged these results.  Review of the MIP images confirms the above findings.  IMPRESSION: 1. Subtle luminal irregularity of the right cervical vertebral artery is likely artifact (as opposed to dissection). No flow limiting stenosis. 2. No acute intracranial findings. 3. Intracranial arterial calcifications, likely early atherosclerosis.   Electronically Signed   By: Jorje Guild M.D.   On: 04/15/2013 17:29   Ct Angio Neck W/cm &/or Wo/cm  04/15/2013   CLINICAL DATA:  Headache and right temporal vision loss.  Neck pain.  EXAM: CT ANGIOGRAPHY HEAD AND NECK  TECHNIQUE: Multidetector CT imaging of the head and neck was performed using the standard protocol during bolus administration of intravenous contrast. Multiplanar CT image reconstructions and MIPs were obtained to evaluate the vascular anatomy. Carotid stenosis measurements (when applicable) are obtained utilizing NASCET criteria, using the distal internal carotid diameter as the denominator.  CONTRAST:  41mL OMNIPAQUE IOHEXOL 350 MG/ML SOLN  COMPARISON:  Head CT 03/11/2012  FINDINGS: CTA HEAD FINDINGS  Skull and Sinuses:No acute osseous findings. Atelectatic right maxillary sinus with mild mucosal  thickening.  Orbits: No acute abnormality.  Brain: No evidence of acute abnormality, such as acute infarction, hemorrhage, hydrocephalus, or mass lesion/mass effect. No abnormal parenchymal enhancement on delayed imaging.  Standard intracranial anatomy. No evidence of aneurysm, significant stenosis, or major vessel occlusion. No evidence of vasculopathy. There is early circumferential calcification of the right paraclinoid ICA.  Review of the MIP images confirms the above findings.  CTA NECK FINDINGS  No aortic aneurysm or dissection. Branching pattern from the aorta is standard. There is no significant stenosis involving the carotid or vertebral systems. There is subtle luminal undulation along the right V2 segment, in an area of decreased resolution due to soft tissue attenuation. There is no flow limiting stenosis. Noted dissection flap. No pseudoaneurysm. No evidence of typical fibromuscular dysplasia. Early arterial calcification of the right V4 segment.  These results were called by telephone at the time of interpretation on 04/15/2013 at 5:20 PM to Dr. Elnora Morrison , who verbally acknowledged these results.  Review of the MIP images confirms the above findings.  IMPRESSION: 1. Subtle luminal irregularity of the right cervical vertebral artery is likely artifact (as opposed to dissection). No flow limiting stenosis. 2. No acute intracranial findings. 3. Intracranial arterial calcifications, likely early atherosclerosis.   Electronically Signed   By: Jorje Guild M.D.   On: 04/15/2013 17:29   Mr Brain Wo Contrast  04/15/2013   CLINICAL DATA:  Headache and pain along right-sided neck extending into  shoulder for the past week. Into Tina right-sided visual loss with headaches.  EXAM: MRI HEAD WITHOUT CONTRAST  TECHNIQUE: Multiplanar, multiecho pulse sequences of the brain and surrounding structures were obtained without intravenous contrast.  COMPARISON:  CT angiogram same day.  FINDINGS: No acute infarct.   No intracranial hemorrhage.  No intracranial mass lesion noted on this unenhanced exam.  No hydrocephalus.  Major intracranial vascular structures are patent.  Small right maxillary sinus. Minimal mucosal thickening ethmoid sinus air cells and maxillary sinuses.  Cervical medullary junction, pituitary region, pineal region and orbital structures unremarkable.  IMPRESSION: Minimal paranasal sinus mucosal thickening otherwise negative unenhanced MR of the brain as noted above.   Electronically Signed   By: Chauncey Cruel M.D.   On: 04/15/2013 19:49    Medications: Scheduled Meds: . [COMPLETED] sodium chloride   Intravenous STAT  . busPIRone  15 mg Oral TID  . enoxaparin (LOVENOX) injection  40 mg Subcutaneous QHS  . gabapentin  300 mg Oral TID  . hydrochlorothiazide  25 mg Oral Daily  . OXcarbazepine  600 mg Oral BID  . QUEtiapine  800 mg Oral QHS  . sertraline  150 mg Oral Daily  . sodium chloride  3 mL Intravenous Q12H      LOS: 1 day   RAI,RIPUDEEP M.D. [facility] 04/16/2013, 12:17 PM Pager: [phone]  If 7PM-7AM, please contact night-coverage [email or web address] Password [email or web address]

## 2013-04-16 NOTE — Progress Notes (Signed)
UR Completed.  Tina Saunders [phone] 04/16/2013  

## 2013-04-17 DIAGNOSIS — F3289 Other specified depressive episodes: Secondary | ICD-10-CM

## 2013-04-17 DIAGNOSIS — F329 Major depressive disorder, single episode, unspecified: Secondary | ICD-10-CM

## 2013-04-17 MED ORDER — TOPIRAMATE 25 MG PO TABS: 25.0000 mg | ORAL_TABLET | Freq: Two times a day (BID) | ORAL | Status: DC

## 2013-04-17 MED ORDER — TRAMADOL HCL 50 MG PO TABS: 50.0000 mg | ORAL_TABLET | Freq: Four times a day (QID) | ORAL | Status: DC | PRN

## 2013-04-17 MED ORDER — TOPIRAMATE 25 MG PO TABS
25.0000 mg | ORAL_TABLET | Freq: Two times a day (BID) | ORAL | Status: DC
  Filled 2013-04-17 (×2): qty 1

## 2013-04-17 MED ORDER — METOCLOPRAMIDE HCL 5 MG PO TABS: 5.0000 mg | ORAL_TABLET | Freq: Three times a day (TID) | ORAL | Status: DC | PRN

## 2013-04-17 NOTE — Discharge Summary (Signed)
Physician Discharge Summary  Patient ID: NOLLIE SHIFLETT MRN: [id number] DOB/AGE: 20-Dec-1985 28 y.o.  Admit date: 04/15/2013 Discharge date: 04/17/2013  Primary Care Physician:  No PCP Per Patient  Discharge Diagnoses:    Marland Kitchen Vision loss of right eye transient, resolved    Complex migraine  . HTN (hypertension)  Consults: Neurology   Recommendations for Outpatient Follow-up:  Patient was admitted to followup with [facility] and may need neurology referral to followup on her migraines   Allergies:   Allergies  Allergen Reactions  . Geodon [Ziprasidone Hcl] Other (See Comments)    Face pulls, cant swallow - Locked Jaw  . Haldol [Haloperidol Lactate] Other (See Comments)    Face pulls, can't swallow - Locked Jaw  . Morphine And Related Hives, Itching and Other (See Comments)    GI upset     Discharge Medications:   Medication List         acetaminophen 500 MG tablet  Commonly known as:  TYLENOL  Take 1,000 mg by mouth every 6 (six) hours as needed.     albuterol 108 (90 BASE) MCG/ACT inhaler  Commonly known as:  PROVENTIL HFA;VENTOLIN HFA  Inhale 2 puffs into the lungs every 4 (four) hours as needed for wheezing or shortness of breath.     busPIRone 15 MG tablet  Commonly known as:  BUSPAR  Take 15 mg by mouth 3 (three) times daily.     gabapentin 300 MG capsule  Commonly known as:  NEURONTIN  Take 300 mg by mouth 3 (three) times daily.     hydrochlorothiazide 25 MG tablet  Commonly known as:  HYDRODIURIL  Take 25 mg by mouth daily.     hydrOXYzine 25 MG capsule  Commonly known as:  VISTARIL  Take 50 mg by mouth 3 (three) times daily as needed for anxiety.     ibuprofen 200 MG tablet  Commonly known as:  ADVIL,MOTRIN  Take 200 mg by mouth every 6 (six) hours as needed.     metoCLOPramide 5 MG tablet  Commonly known as:  REGLAN  Take 1 tablet (5 mg total) by mouth every 8 (eight) hours as needed for nausea.     oxcarbazepine 600 MG tablet   Commonly known as:  TRILEPTAL  Take 600 mg by mouth 2 (two) times daily.     QUEtiapine 400 MG 24 hr tablet  Commonly known as:  SEROQUEL XR  Take 800 mg by mouth at bedtime.     sertraline 50 MG tablet  Commonly known as:  ZOLOFT  Take 150 mg by mouth daily.     topiramate 25 MG tablet  Commonly known as:  TOPAMAX  Take 1 tablet (25 mg total) by mouth 2 (two) times daily. For migraine headaches     traMADol 50 MG tablet  Commonly known as:  ULTRAM  Take 1 tablet (50 mg total) by mouth every 6 (six) hours as needed for moderate pain or severe pain.         Brief H and P: For complete details please refer to admission H and P, but in brief IVONNA KINNICK is a 28 y.o. 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 Patient has poor vision in the left eye since age 78. Patient started experiencing right-sided headache and neck pain for almost a week's time along with right-sided  visual loss. At times patient also had temporal pounding headaches with some nausea and photophobia. Denied any vomiting loss of consciousness chest pain or shortness of breath. In the ER patient had CT angiogram of the head and neck which were showing subtle luminal irregularity which was followed by MRI of the brain which as per the neurologist did not show anything acute. Patient was given migraine cocktail in the ER and neurologist has advised the patient for further observation. Patient did not have any loss of function of the upper or lower extremities   Hospital Course:   Vision loss of right eye with headaches: Patient was admitted for further workup, neurology was consulted and recommended MRI of the brain which was negative for acute CVA, no papilledema distress to pseudotumor cerebri. With her description of the headache, unilateral on the  right side from the back of her head to the retro-orbital area, tingling on the right hand, associated with photophobia nausea, neurology felt strongly that this was more consistent of migraine headache rather than stroke. Patient was given Toradol with Reglan and Benadryl with the Depakote x1 which significantly improved her symptoms. Dr. Aram Beecham with neurology recommended starting Topamax 25 mg twice a day for headache prevention with further dose adjustment as outpatient.  HTN (hypertension)  - Likely elevated due to headache, otherwise not on any antihypertensives outpatient   History of anxiety  - Continue current medications   Day of Discharge BP 101/54  Pulse 74  Temp(Src) 98.1 F (36.7 C) (Oral)  Resp 18  Ht 5\' 4"  (1.626 m)  Wt 120.145 kg (264 lb 14 oz)  BMI 45.44 kg/m2  SpO2 95%  LMP 02/16/2013  Physical Exam: General: Alert and awake oriented x3 not in any acute distress. HEENT: anicteric sclera, pupils reactive to light and accommodation CVS: S1-S2 clear no murmur rubs or gallops Chest: clear to auscultation bilaterally, no wheezing rales or rhonchi Abdomen: soft nontender, nondistended, normal bowel sounds Extremities: no cyanosis, clubbing or edema noted bilaterally Neuro: Cranial nerves II-XII intact, no focal neurological deficits   The results of significant diagnostics from this hospitalization (including imaging, microbiology, ancillary and laboratory) are listed below for reference.    LAB RESULTS: Basic Metabolic Panel:  Recent Labs Lab 04/15/13 1600 04/16/13 0501  NA 141 140  K 4.1 4.0  CL 102 104  CO2 25 24  GLUCOSE 92 104*  BUN 8 7  CREATININE 0.51 0.46*  CALCIUM 9.1 8.4   Liver Function Tests:  Recent Labs Lab 04/16/13 0501  AST 62*  ALT 39*  ALKPHOS 82  BILITOT 0.2*  PROT 6.4  ALBUMIN 3.2*   No results found for this basename: LIPASE, AMYLASE,  in the last 168 hours No results found for this basename: AMMONIA,  in the last 168  hours CBC:  Recent Labs Lab 04/15/13 1600 04/16/13 0501  WBC 6.7 5.6  NEUTROABS 4.1 2.7  HGB 13.5 12.8  HCT 39.9 37.6  MCV 85.3 85.6  PLT 212 188   Cardiac Enzymes: No results found for this basename: CKTOTAL, CKMB, CKMBINDEX, TROPONINI,  in the last 168 hours BNP: No components found with this basename: POCBNP,  CBG: No results found for this basename: GLUCAP,  in the last 168 hours  Significant Diagnostic Studies:  Ct Angio Head W/cm &/or Wo Cm  04/15/2013   CLINICAL DATA:  Headache and right temporal vision loss.  Neck pain.  EXAM: CT ANGIOGRAPHY HEAD AND NECK  TECHNIQUE: Multidetector CT imaging of the head and  neck was performed using the standard protocol during bolus administration of intravenous contrast. Multiplanar CT image reconstructions and MIPs were obtained to evaluate the vascular anatomy. Carotid stenosis measurements (when applicable) are obtained utilizing NASCET criteria, using the distal internal carotid diameter as the denominator.  CONTRAST:  66mL OMNIPAQUE IOHEXOL 350 MG/ML SOLN  COMPARISON:  Head CT 03/11/2012  FINDINGS: CTA HEAD FINDINGS  Skull and Sinuses:No acute osseous findings. Atelectatic right maxillary sinus with mild mucosal thickening.  Orbits: No acute abnormality.  Brain: No evidence of acute abnormality, such as acute infarction, hemorrhage, hydrocephalus, or mass lesion/mass effect. No abnormal parenchymal enhancement on delayed imaging.  Standard intracranial anatomy. No evidence of aneurysm, significant stenosis, or major vessel occlusion. No evidence of vasculopathy. There is early circumferential calcification of the right paraclinoid ICA.  Review of the MIP images confirms the above findings.  CTA NECK FINDINGS  No aortic aneurysm or dissection. Branching pattern from the aorta is standard. There is no significant stenosis involving the carotid or vertebral systems. There is subtle luminal undulation along the right V2 segment, in an area of  decreased resolution due to soft tissue attenuation. There is no flow limiting stenosis. Noted dissection flap. No pseudoaneurysm. No evidence of typical fibromuscular dysplasia. Early arterial calcification of the right V4 segment.  These results were called by telephone at the time of interpretation on 04/15/2013 at 5:20 PM to Dr. Elnora Morrison , who verbally acknowledged these results.  Review of the MIP images confirms the above findings.  IMPRESSION: 1. Subtle luminal irregularity of the right cervical vertebral artery is likely artifact (as opposed to dissection). No flow limiting stenosis. 2. No acute intracranial findings. 3. Intracranial arterial calcifications, likely early atherosclerosis.   Electronically Signed   By: Jorje Guild M.D.   On: 04/15/2013 17:29   Ct Angio Neck W/cm &/or Wo/cm  04/15/2013   CLINICAL DATA:  Headache and right temporal vision loss.  Neck pain.  EXAM: CT ANGIOGRAPHY HEAD AND NECK  TECHNIQUE: Multidetector CT imaging of the head and neck was performed using the standard protocol during bolus administration of intravenous contrast. Multiplanar CT image reconstructions and MIPs were obtained to evaluate the vascular anatomy. Carotid stenosis measurements (when applicable) are obtained utilizing NASCET criteria, using the distal internal carotid diameter as the denominator.  CONTRAST:  67mL OMNIPAQUE IOHEXOL 350 MG/ML SOLN  COMPARISON:  Head CT 03/11/2012  FINDINGS: CTA HEAD FINDINGS  Skull and Sinuses:No acute osseous findings. Atelectatic right maxillary sinus with mild mucosal thickening.  Orbits: No acute abnormality.  Brain: No evidence of acute abnormality, such as acute infarction, hemorrhage, hydrocephalus, or mass lesion/mass effect. No abnormal parenchymal enhancement on delayed imaging.  Standard intracranial anatomy. No evidence of aneurysm, significant stenosis, or major vessel occlusion. No evidence of vasculopathy. There is early circumferential calcification of  the right paraclinoid ICA.  Review of the MIP images confirms the above findings.  CTA NECK FINDINGS  No aortic aneurysm or dissection. Branching pattern from the aorta is standard. There is no significant stenosis involving the carotid or vertebral systems. There is subtle luminal undulation along the right V2 segment, in an area of decreased resolution due to soft tissue attenuation. There is no flow limiting stenosis. Noted dissection flap. No pseudoaneurysm. No evidence of typical fibromuscular dysplasia. Early arterial calcification of the right V4 segment.  These results were called by telephone at the time of interpretation on 04/15/2013 at 5:20 PM to Dr. Elnora Morrison , who verbally acknowledged these results.  Review of the MIP images confirms the above findings.  IMPRESSION: 1. Subtle luminal irregularity of the right cervical vertebral artery is likely artifact (as opposed to dissection). No flow limiting stenosis. 2. No acute intracranial findings. 3. Intracranial arterial calcifications, likely early atherosclerosis.   Electronically Signed   By: Jorje Guild M.D.   On: 04/15/2013 17:29   Mr Brain Wo Contrast  04/15/2013   CLINICAL DATA:  Headache and pain along right-sided neck extending into shoulder for the past week. Into min right-sided visual loss with headaches.  EXAM: MRI HEAD WITHOUT CONTRAST  TECHNIQUE: Multiplanar, multiecho pulse sequences of the brain and surrounding structures were obtained without intravenous contrast.  COMPARISON:  CT angiogram same day.  FINDINGS: No acute infarct.  No intracranial hemorrhage.  No intracranial mass lesion noted on this unenhanced exam.  No hydrocephalus.  Major intracranial vascular structures are patent.  Small right maxillary sinus. Minimal mucosal thickening ethmoid sinus air cells and maxillary sinuses.  Cervical medullary junction, pituitary region, pineal region and orbital structures unremarkable.  IMPRESSION: Minimal paranasal sinus mucosal  thickening otherwise negative unenhanced MR of the brain as noted above.   Electronically Signed   By: Chauncey Cruel M.D.   On: 04/15/2013 19:49       Disposition and Follow-up:     Discharge Orders   Future Orders Complete By Expires   Diet general  As directed    Discharge instructions  As directed    Comments:     Please make appointment with PCP and follow-up with neurology outpatient   Increase activity slowly  As directed        DISPOSITION: Home DIET: Regular diet   DISCHARGE FOLLOW-UP Follow-up Information   Follow up with [facility]. Schedule an appointment as soon as possible for a visit in 2 weeks. (for hospital follow-up)    Contact information:   [place] [id number] [phone]      Time spent on Discharge: 35 minutes  Signed:   Millisa Giarrusso M.D. [facility] 04/17/2013, 10:30 AM Pager: [phone]

## 2013-04-17 NOTE — Progress Notes (Signed)
NEURO HOSPITALIST PROGRESS NOTE   SUBJECTIVE:                                                                                                                        Stated that the HA is gone and her vision is back to normal since earlier today. Some neck discomfort but otherwise offers no other complains.  OBJECTIVE:                                                                                                                           Vital signs in last 24 hours: Temp:  [97.6 F (36.4 C)-99.1 F (37.3 C)] 98.1 F (36.7 C) (03/26 0807) Pulse Rate:  [74-108] 74 (03/26 0807) Resp:  [16-20] 18 (03/26 0807) BP: (101-139)/(54-107) 101/54 mmHg (03/26 0807) SpO2:  [90 %-98 %] 95 % (03/26 0807) Weight:  [120.145 kg (264 lb 14 oz)] 120.145 kg (264 lb 14 oz) (03/26 0000)  Intake/Output from previous day:   Intake/Output this shift:   Nutritional status: Cardiac  Past Medical History  Diagnosis Date  . Polycystic ovarian syndrome 07/01/2011    Patient report  . Anxiety   . Depression   . Cancer of abdominal wall   . Rhabdosarcoma   . Hypertension   . Obesity     Neurologic Exam:  Mental Status:  Alert, oriented, thought content appropriate. Speech fluent without evidence of aphasia. Able to follow commands without difficulty.  Cranial Nerves:  II-intact  III/IV/VI-Pupils were equal and reacted. Extraocular movements were full and conjugate.  V/VII-no facial numbness and no facial weakness.  VIII-normal.  X-normal speech and symmetrical palatal movement.  Motor: 5/5 bilaterally with normal tone and bulk  Sensory: Slightly reduced perception of tactile sensation over right extremities compared to left.  Deep Tendon Reflexes: 2+ and symmetric.  Plantars: Flexor bilaterally  Cerebellar: Normal finger-to-nose testing   Lab Results: Lab Results  Component Value Date/Time   CHOL 137 04/26/2006  8:38 PM   Lipid Panel No results found for  this basename: CHOL, TRIG, HDL, CHOLHDL, VLDL, LDLCALC,  in the last 72 hours  Studies/Results: Ct Angio Head W/cm &/or Wo Cm  04/15/2013   CLINICAL DATA:  Headache and right temporal vision loss.  Neck pain.  EXAM: CT ANGIOGRAPHY HEAD AND NECK  TECHNIQUE: Multidetector CT imaging of the head and neck was performed using the standard protocol during bolus administration of intravenous contrast. Multiplanar CT image reconstructions and MIPs were obtained to evaluate the vascular anatomy. Carotid stenosis measurements (when applicable) are obtained utilizing NASCET criteria, using the distal internal carotid diameter as the denominator.  CONTRAST:  80mL OMNIPAQUE IOHEXOL 350 MG/ML SOLN  COMPARISON:  Head CT 03/11/2012  FINDINGS: CTA HEAD FINDINGS  Skull and Sinuses:No acute osseous findings. Atelectatic right maxillary sinus with mild mucosal thickening.  Orbits: No acute abnormality.  Brain: No evidence of acute abnormality, such as acute infarction, hemorrhage, hydrocephalus, or mass lesion/mass effect. No abnormal parenchymal enhancement on delayed imaging.  Standard intracranial anatomy. No evidence of aneurysm, significant stenosis, or major vessel occlusion. No evidence of vasculopathy. There is early circumferential calcification of the right paraclinoid ICA.  Review of the MIP images confirms the above findings.  CTA NECK FINDINGS  No aortic aneurysm or dissection. Branching pattern from the aorta is standard. There is no significant stenosis involving the carotid or vertebral systems. There is subtle luminal undulation along the right V2 segment, in an area of decreased resolution due to soft tissue attenuation. There is no flow limiting stenosis. Noted dissection flap. No pseudoaneurysm. No evidence of typical fibromuscular dysplasia. Early arterial calcification of the right V4 segment.  These results were called by telephone at the time of interpretation on 04/15/2013 at 5:20 PM to Dr. Elnora Morrison ,  who verbally acknowledged these results.  Review of the MIP images confirms the above findings.  IMPRESSION: 1. Subtle luminal irregularity of the right cervical vertebral artery is likely artifact (as opposed to dissection). No flow limiting stenosis. 2. No acute intracranial findings. 3. Intracranial arterial calcifications, likely early atherosclerosis.   Electronically Signed   By: Jorje Guild M.D.   On: 04/15/2013 17:29   Ct Angio Neck W/cm &/or Wo/cm  04/15/2013   CLINICAL DATA:  Headache and right temporal vision loss.  Neck pain.  EXAM: CT ANGIOGRAPHY HEAD AND NECK  TECHNIQUE: Multidetector CT imaging of the head and neck was performed using the standard protocol during bolus administration of intravenous contrast. Multiplanar CT image reconstructions and MIPs were obtained to evaluate the vascular anatomy. Carotid stenosis measurements (when applicable) are obtained utilizing NASCET criteria, using the distal internal carotid diameter as the denominator.  CONTRAST:  33mL OMNIPAQUE IOHEXOL 350 MG/ML SOLN  COMPARISON:  Head CT 03/11/2012  FINDINGS: CTA HEAD FINDINGS  Skull and Sinuses:No acute osseous findings. Atelectatic right maxillary sinus with mild mucosal thickening.  Orbits: No acute abnormality.  Brain: No evidence of acute abnormality, such as acute infarction, hemorrhage, hydrocephalus, or mass lesion/mass effect. No abnormal parenchymal enhancement on delayed imaging.  Standard intracranial anatomy. No evidence of aneurysm, significant stenosis, or major vessel occlusion. No evidence of vasculopathy. There is early circumferential calcification of the right paraclinoid ICA.  Review of the MIP images confirms the above findings.  CTA NECK FINDINGS  No aortic aneurysm or dissection. Branching pattern from the aorta is standard. There is no significant stenosis involving the carotid or vertebral systems. There is subtle luminal undulation along the right V2 segment, in an area of decreased  resolution due to soft tissue attenuation. There is no flow limiting stenosis. Noted dissection flap. No pseudoaneurysm. No evidence of typical fibromuscular dysplasia. Early arterial calcification of the right V4 segment.  These results were called by telephone at the time  of interpretation on 04/15/2013 at 5:20 PM to Dr. Elnora Morrison , who verbally acknowledged these results.  Review of the MIP images confirms the above findings.  IMPRESSION: 1. Subtle luminal irregularity of the right cervical vertebral artery is likely artifact (as opposed to dissection). No flow limiting stenosis. 2. No acute intracranial findings. 3. Intracranial arterial calcifications, likely early atherosclerosis.   Electronically Signed   By: Jorje Guild M.D.   On: 04/15/2013 17:29   Mr Brain Wo Contrast  04/15/2013   CLINICAL DATA:  Headache and pain along right-sided neck extending into shoulder for the past week. Into min right-sided visual loss with headaches.  EXAM: MRI HEAD WITHOUT CONTRAST  TECHNIQUE: Multiplanar, multiecho pulse sequences of the brain and surrounding structures were obtained without intravenous contrast.  COMPARISON:  CT angiogram same day.  FINDINGS: No acute infarct.  No intracranial hemorrhage.  No intracranial mass lesion noted on this unenhanced exam.  No hydrocephalus.  Major intracranial vascular structures are patent.  Small right maxillary sinus. Minimal mucosal thickening ethmoid sinus air cells and maxillary sinuses.  Cervical medullary junction, pituitary region, pineal region and orbital structures unremarkable.  IMPRESSION: Minimal paranasal sinus mucosal thickening otherwise negative unenhanced MR of the brain as noted above.   Electronically Signed   By: Chauncey Cruel M.D.   On: 04/15/2013 19:49    MEDICATIONS                                                                                                                       I have reviewed the patient's current  medications.  ASSESSMENT/PLAN:                                                                                                           Probable migraine with aura presenting as status migrainous, now clinically resolved. MRI brain unremarkable and CTA brain/neck unimpressive. No papilledema to suggest pseudotumor cerebri. If no contraindication, consider starting Topamax 25 mg BID for HA prevention, with further dose adjustment as outpatient. Neurology will sign off.  Dorian Pod, MD [facility] [phone]  04/17/2013, 8:17 AM

## 2013-07-26 ENCOUNTER — Emergency Department (HOSPITAL_COMMUNITY): Payer: Medicaid Other

## 2013-07-26 ENCOUNTER — Encounter (HOSPITAL_COMMUNITY): Payer: Self-pay | Admitting: Emergency Medicine

## 2013-07-26 ENCOUNTER — Emergency Department (HOSPITAL_COMMUNITY)
Admission: EM | Admit: 2013-07-26 | Discharge: 2013-07-26 | Disposition: A | Discharge status: Home / Self Care | Payer: Medicaid Other | Attending: Emergency Medicine | Admitting: Emergency Medicine

## 2013-07-26 DIAGNOSIS — Z8589 Personal history of malignant neoplasm of other organs and systems: Secondary | ICD-10-CM | POA: Diagnosis not present

## 2013-07-26 DIAGNOSIS — Z79899 Other long term (current) drug therapy: Secondary | ICD-10-CM | POA: Diagnosis not present

## 2013-07-26 DIAGNOSIS — E669 Obesity, unspecified: Secondary | ICD-10-CM | POA: Diagnosis not present

## 2013-07-26 DIAGNOSIS — F411 Generalized anxiety disorder: Secondary | ICD-10-CM | POA: Diagnosis not present

## 2013-07-26 DIAGNOSIS — R112 Nausea with vomiting, unspecified: Secondary | ICD-10-CM | POA: Diagnosis not present

## 2013-07-26 DIAGNOSIS — Z862 Personal history of diseases of the blood and blood-forming organs and certain disorders involving the immune mechanism: Secondary | ICD-10-CM | POA: Insufficient documentation

## 2013-07-26 DIAGNOSIS — Z8639 Personal history of other endocrine, nutritional and metabolic disease: Secondary | ICD-10-CM | POA: Diagnosis not present

## 2013-07-26 DIAGNOSIS — I10 Essential (primary) hypertension: Secondary | ICD-10-CM | POA: Insufficient documentation

## 2013-07-26 DIAGNOSIS — R1011 Right upper quadrant pain: Secondary | ICD-10-CM | POA: Insufficient documentation

## 2013-07-26 DIAGNOSIS — Z3202 Encounter for pregnancy test, result negative: Secondary | ICD-10-CM | POA: Diagnosis not present

## 2013-07-26 DIAGNOSIS — F329 Major depressive disorder, single episode, unspecified: Secondary | ICD-10-CM | POA: Diagnosis not present

## 2013-07-26 DIAGNOSIS — F3289 Other specified depressive episodes: Secondary | ICD-10-CM | POA: Diagnosis not present

## 2013-07-26 LAB — URINALYSIS, ROUTINE W REFLEX MICROSCOPIC
Bilirubin Urine: NEGATIVE
GLUCOSE, UA: NEGATIVE mg/dL
Hgb urine dipstick: NEGATIVE
KETONES UR: NEGATIVE mg/dL
NITRITE: NEGATIVE
PROTEIN: NEGATIVE mg/dL
Specific Gravity, Urine: 1.027 (ref 1.005–1.030)
Urobilinogen, UA: 0.2 mg/dL (ref 0.0–1.0)
pH: 5.5 (ref 5.0–8.0)

## 2013-07-26 LAB — CBC WITH DIFFERENTIAL/PLATELET
BASOS ABS: 0 10*3/uL (ref 0.0–0.1)
Basophils Relative: 0 % (ref 0–1)
EOS PCT: 7 % — AB (ref 0–5)
Eosinophils Absolute: 0.5 10*3/uL (ref 0.0–0.7)
HCT: 35.5 % — ABNORMAL LOW (ref 36.0–46.0)
HEMOGLOBIN: 11.8 g/dL — AB (ref 12.0–15.0)
LYMPHS ABS: 1.8 10*3/uL (ref 0.7–4.0)
Lymphocytes Relative: 24 % (ref 12–46)
MCH: 28.3 pg (ref 26.0–34.0)
MCHC: 33.2 g/dL (ref 30.0–36.0)
MCV: 85.1 fL (ref 78.0–100.0)
MONO ABS: 0.3 10*3/uL (ref 0.1–1.0)
MONOS PCT: 4 % (ref 3–12)
NEUTROS ABS: 4.9 10*3/uL (ref 1.7–7.7)
Neutrophils Relative %: 65 % (ref 43–77)
Platelets: 199 10*3/uL (ref 150–400)
RBC: 4.17 MIL/uL (ref 3.87–5.11)
RDW: 13.8 % (ref 11.5–15.5)
WBC: 7.5 10*3/uL (ref 4.0–10.5)

## 2013-07-26 LAB — COMPREHENSIVE METABOLIC PANEL
ALT: 50 U/L — ABNORMAL HIGH (ref 0–35)
AST: 68 U/L — AB (ref 0–37)
Albumin: 3.6 g/dL (ref 3.5–5.2)
Alkaline Phosphatase: 95 U/L (ref 39–117)
Anion gap: 16 — ABNORMAL HIGH (ref 5–15)
BILIRUBIN TOTAL: 0.2 mg/dL — AB (ref 0.3–1.2)
BUN: 9 mg/dL (ref 6–23)
CALCIUM: 8.7 mg/dL (ref 8.4–10.5)
CHLORIDE: 99 meq/L (ref 96–112)
CO2: 23 mEq/L (ref 19–32)
CREATININE: 0.53 mg/dL (ref 0.50–1.10)
GLUCOSE: 141 mg/dL — AB (ref 70–99)
Potassium: 3.9 mEq/L (ref 3.7–5.3)
Sodium: 138 mEq/L (ref 137–147)
Total Protein: 6.9 g/dL (ref 6.0–8.3)

## 2013-07-26 LAB — LIPASE, BLOOD: LIPASE: 43 U/L (ref 11–59)

## 2013-07-26 LAB — URINE MICROSCOPIC-ADD ON

## 2013-07-26 LAB — PREGNANCY, URINE: PREG TEST UR: NEGATIVE

## 2013-07-26 MED ORDER — IOHEXOL 300 MG/ML  SOLN
100.0000 mL | Freq: Once | INTRAMUSCULAR | Status: AC | PRN
  Administered 2013-07-26: 100 mL via INTRAVENOUS

## 2013-07-26 MED ORDER — ONDANSETRON HCL 4 MG PO TABS: 4.0000 mg | ORAL_TABLET | Freq: Four times a day (QID) | ORAL | Status: DC

## 2013-07-26 MED ORDER — ONDANSETRON HCL 4 MG/2ML IJ SOLN
4.0000 mg | Freq: Once | INTRAMUSCULAR | Status: AC
  Administered 2013-07-26: 4 mg via INTRAVENOUS
  Filled 2013-07-26: qty 2

## 2013-07-26 MED ORDER — SODIUM CHLORIDE 0.9 % IV SOLN
INTRAVENOUS | Status: DC
  Administered 2013-07-26: 18:00:00 via INTRAVENOUS

## 2013-07-26 MED ORDER — HYDROMORPHONE HCL PF 1 MG/ML IJ SOLN
0.5000 mg | Freq: Once | INTRAMUSCULAR | Status: AC
  Administered 2013-07-26: 0.5 mg via INTRAVENOUS
  Filled 2013-07-26: qty 1

## 2013-07-26 NOTE — ED Notes (Signed)
Pt requesting pain and nausea medicine. PA made aware.

## 2013-07-26 NOTE — Discharge Instructions (Signed)
Please call and set up an appointment with women's outpatient clinic regarding ovarian cyst-needs to be monitored with ultrasounds Please call and set up an appointment with gastroenterologist as well as surgery regarding abdominal pain and regarding fatty liver Please rest and stay hydrated Please avoid any foods high in fat and grease Please take Zofran as needed for nausea Please take with a clear diet for the next couple of days Please continue to monitor symptoms closely and if symptoms are worsen or change (fever greater than 101, chills, chest pain, shortness of breath, difficulty breathing, numbness, tingling, worsening or changes to pain pattern, blood in the stools, black for stools, inability to keep any food or fluids down, neck pain, neck stiffness) please report back to the ED immediately   Abdominal Pain, Women Abdominal (stomach, pelvic, or belly) pain can be caused by many things. It is important to tell your doctor:  The location of the pain.  Does it come and go or is it present all the time?  Are there things that start the pain (eating certain foods, exercise)?  Are there other symptoms associated with the pain (fever, nausea, vomiting, diarrhea)? All of this is helpful to know when trying to find the cause of the pain. CAUSES   Stomach: virus or bacteria infection, or ulcer.  Intestine: appendicitis (inflamed appendix), regional ileitis (Crohn's disease), ulcerative colitis (inflamed colon), irritable bowel syndrome, diverticulitis (inflamed diverticulum of the colon), or cancer of the stomach or intestine.  Gallbladder disease or stones in the gallbladder.  Kidney disease, kidney stones, or infection.  Pancreas infection or cancer.  Fibromyalgia (pain disorder).  Diseases of the female organs:  Uterus: fibroid (non-cancerous) tumors or infection.  Fallopian tubes: infection or tubal pregnancy.  Ovary: cysts or tumors.  Pelvic adhesions (scar  tissue).  Endometriosis (uterus lining tissue growing in the pelvis and on the pelvic organs).  Pelvic congestion syndrome (female organs filling up with blood just before the menstrual period).  Pain with the menstrual period.  Pain with ovulation (producing an egg).  Pain with an IUD (intrauterine device, birth control) in the uterus.  Cancer of the female organs.  Functional pain (pain not caused by a disease, may improve without treatment).  Psychological pain.  Depression. DIAGNOSIS  Your doctor will decide the seriousness of your pain by doing an examination.  Blood tests.  X-rays.  Ultrasound.  CT scan (computed tomography, special type of X-ray).  MRI (magnetic resonance imaging).  Cultures, for infection.  Barium enema (dye inserted in the large intestine, to better view it with X-rays).  Colonoscopy (looking in intestine with a lighted tube).  Laparoscopy (minor surgery, looking in abdomen with a lighted tube).  Major abdominal exploratory surgery (looking in abdomen with a large incision). TREATMENT  The treatment will depend on the cause of the pain.   Many cases can be observed and treated at home.  Over-the-counter medicines recommended by your caregiver.  Prescription medicine.  Antibiotics, for infection.  Birth control pills, for painful periods or for ovulation pain.  Hormone treatment, for endometriosis.  Nerve blocking injections.  Physical therapy.  Antidepressants.  Counseling with a psychologist or psychiatrist.  Minor or major surgery. HOME CARE INSTRUCTIONS   Do not take laxatives, unless directed by your caregiver.  Take over-the-counter pain medicine only if ordered by your caregiver. Do not take aspirin because it can cause an upset stomach or bleeding.  Try a clear liquid diet (broth or water) as ordered by your caregiver.  Slowly move to a bland diet, as tolerated, if the pain is related to the stomach or  intestine.  Have a thermometer and take your temperature several times a day, and record it.  Bed rest and sleep, if it helps the pain.  Avoid sexual intercourse, if it causes pain.  Avoid stressful situations.  Keep your follow-up appointments and tests, as your caregiver orders.  If the pain does not go away with medicine or surgery, you may try:  Acupuncture.  Relaxation exercises (yoga, meditation).  Group therapy.  Counseling. SEEK MEDICAL CARE IF:   You notice certain foods cause stomach pain.  Your home care treatment is not helping your pain.  You need stronger pain medicine.  You want your IUD removed.  You feel faint or lightheaded.  You develop nausea and vomiting.  You develop a rash.  You are having side effects or an allergy to your medicine. SEEK IMMEDIATE MEDICAL CARE IF:   Your pain does not go away or gets worse.  You have a fever.  Your pain is felt only in portions of the abdomen. The right side could possibly be appendicitis. The left lower portion of the abdomen could be colitis or diverticulitis.  You are passing blood in your stools (bright red or black tarry stools, with or without vomiting).  You have blood in your urine.  You develop chills, with or without a fever.  You pass out. MAKE SURE YOU:   Understand these instructions.  Will watch your condition.  Will get help right away if you are not doing well or get worse. Document Released: 11/06/2006 Document Revised: 04/03/2011 Document Reviewed: 11/26/2008 [facility] Patient Information 2015 [place], [place]. This information is not intended to replace advice given to you by your health care provider. Make sure you discuss any questions you have with your health care provider.   Emergency Department Resource Guide 1) Find a Doctor and Pay Out of Pocket Although you won't have to find out who is covered by your insurance plan, it is a good idea to ask around and get  recommendations. You will then need to call the office and see if the doctor you have chosen will accept you as a new patient and what types of options they offer for patients who are self-pay. Some doctors offer discounts or will set up payment plans for their patients who do not have insurance, but you will need to ask so you aren't surprised when you get to your appointment.  2) Contact Your Local Health Department Not all health departments have doctors that can see patients for sick visits, but many do, so it is worth a call to see if yours does. If you don't know where your local health department is, you can check in your phone book. The CDC also has a tool to help you locate your state's health department, and many state websites also have listings of all of their local health departments.  3) Find a [facility] If your illness is not likely to be very severe or complicated, you may want to try a walk in clinic. These are popping up all over the country in pharmacies, drugstores, and shopping centers. They're usually staffed by nurse practitioners or physician assistants that have been trained to treat common illnesses and complaints. They're usually fairly quick and inexpensive. However, if you have serious medical issues or chronic medical problems, these are probably not your best option.  No Primary Care Doctor: - Call Health  Connect at  [phone] - they can help you locate a primary care doctor that  accepts your insurance, provides certain services, etc. - Physician Referral Service- [phone]  Chronic Pain Problems: Organization         Address  Phone   Notes  [facility]  [phone] Patients need to be referred by their primary care doctor.   Medication Assistance: Organization         Address  Phone   Notes  [facility] Medication [facility] [address] [place], [place] [address] [phone] --Must be a resident of  [facility] -- Must have NO insurance coverage whatsoever (no Medicaid/ Medicare, etc.) -- The pt. MUST have a primary care doctor that directs their care regularly and follows them in the community   Med[facility]ist  [phone]   [employer]  [phone]    Agencies that provide inexpensive medical care: Organization         Address  Phone   Notes  [place]  [phone]   [facility]    [phone]   [facility] [place], [place] [address] [phone]   [place] [address], [place] [phone]   Planned Parenthood    [phone]   [facility]    [phone]   [place] and [place] [address], [place] Phone:  [phone], Fax:  [phone] Hours of Operation:  9 am - 6 pm, M-F.  Also accepts Medicaid/Medicare and self-pay.  [facility] for [place], [address], [place] Phone: [phone], Fax: [phone]. Hours of Operation:  8:30 am - 5:30 pm, M-F.  Also accepts Medicaid and self-pay.  [facility] [place] [address], [place] Phone: [phone]   [place], [place], [place] [phone], Ext. [phone] Mondays & Thursdays: 7-9 AM.  First 15 patients are seen on a first come, first serve basis.    [place] Providers:  Organization         Address  Phone   Notes  [facility] [address], [place] [phone] Also accepts self-pay patients.  [facility] [address], [place]  [phone]   [place], [address], [place] [phone]   [facility] [address] [place] [phone]   Lucianne Lei [address], [place]   [phone] Only accepts [place] Access  [place] patients after they have their name applied to their card.   Self-Pay (no insurance) in [facility]:  Organization         Address  Phone   Notes  Sickle Cell Patients, [facility] [place] [phone]   [facility] [place] [phone]   [facility] [place]  [place], [place], [place] [phone]   [facility]/Dr. Osei-Bonsu  [address], [place] or [address], [place] [phone] Phone number for both [place] and [place] locations is the same.  [facility] [address], [place] [phone]   [facility] [place] [phone] [place]  [address], [place] or [address] [phone]   [facility] [phone], phone; [phone], fax Sees patients 1st and 3rd Saturday of every month.  Must not qualify for public or private insurance (i.e. Medicaid, Medicare, [place] Health Choice, Veterans' Benefits)  Household income should be no more than 200% of the poverty level The clinic cannot treat you if you are pregnant or think you are pregnant  Sexually transmitted diseases are not treated at the clinic.    Dental Care: Organization         Address  Phone  Notes  [facility] [place] [phone] Accepts children up to age 93 who are enrolled in [place] or [place]; pregnant women with a Medicaid card; and children who have applied for Medicaid or [place] Health Choice, but were declined, whose parents can pay a reduced fee at time of service.  [facility] Department of [facility]  [address], [place] [phone] Accepts children up to age 5 who are enrolled in [place] or [place]; pregnant women with a Medicaid  card; and children who have applied for Medicaid or [place] Health Choice, but were declined, whose parents can pay a reduced fee at time of service.  [place] Adult Dental Access PROGRAM  [place] [phone] Patients are seen by appointment only. Walk-ins are not accepted. [place] will see patients 36 years of age and older. Monday - Tuesday (8am-5pm) Most Wednesdays (8:30-5pm) $30 per visit, cash only  [facility] Adult Dental Access PROGRAM  [address], [facility] [phone] Patients are seen by appointment only. Walk-ins are not accepted. [place] will see patients 1 years of age and older. One Wednesday Evening (Monthly: Volunteer Based).  $30 per visit, cash only  [place]  [phone] for adults; Children under age 65, call Graduate Pediatric Dentistry at [phone]. Children aged 22-14, please call [phone] to request a pediatric application.  Dental services are provided in all areas of dental care including fillings, crowns and bridges, complete and partial dentures, implants, gum treatment, root canals, and extractions. Preventive care is also provided. Treatment is provided to both adults and children. Patients are selected via a lottery and there is often a waiting list.   [facility] [address] [place]  [phone] [email or web address]   [facility] [address] [place], [place] [phone], Ext. [phone] Second and Fourth Thursday of each month, opens at 6:30 AM; Clinic ends at 9 AM.  Patients are seen on a first-come first-served basis, and a limited number are seen during each clinic.   [facility]  [address] [place]ard Danker [place], [place] [phone]   Eligibility Requirements You must have lived in [place], [place], or [place] counties for at least the last three months.   You cannot be eligible for state or federal sponsored [employer],  including [employer], [place], or [employer].   You generally cannot be eligible for healthcare insurance through your employer.    How to apply: Eligibility screenings are held every Tuesday and Wednesday afternoon from 1:00 pm until 4:00 pm. You do not need an appointment for the interview!  [facility] [address], [place], [place]   [place]  [place] Department  [place]  [phone]    Behavioral Health Resources in the Community: Intensive Outpatient Programs Organization         Address  Phone  Notes  [place]. [address], [place], [place] [phone]   [facility] Outpatient [address] [place], [place]   [facility] [address] [place], [place]   [place] [address]  [place], [place] or [phone]   Substance Abuse Resources Organization         Address  Phone  Notes  Al[facility] Drug Services  ([phone]   [place]  [phone]   [facility] [place]   Chinita Pester  [phone]   Residential & Outpatient Substance Abuse Program  [phone]   Psychological Services Organization         Address  Phone  Notes  [facility] [place]  [place]  [phone]   [place] [address], [place] or [phone]    Mobile Crisis Teams Organization         Address  Phone  Notes  [facility], Mobile Crisis Care Unit  [phone]   [facility]  [address] [place], [place]   [place] [address], [place] [phone]    Self-Help/Support Groups Organization         Address  Phone             Notes  [place].  of [place] - variety of support groups  [place] Call for more information  Narcotics Anonymous (NA), [facility] [address], [employer] [place]  2 meetings at this location   Special educational needs teacher         Address  Phone  Notes  [facility] Residential Treatment [place],    [place]  [phone]   [facility]  [address], [place] [phone], [place], [place]   [place], [place] [phone] Admissions: 8am-3pm M-F  Incentives Substance [place] [address],    [place], [place] [phone]   [facility] [address] [place], [place], [place]   [facility] [address],  [place], [place]   [facility] - Intensive Outpatient [address] [place]een [address], [place], [place]   [facility] ([place].) [place].,  [place], [place] [phone] or [phone]   [facility] (RTS) [address], [place], [place] Accepts Medicaid  [facility] [place] [address]  [place] [phone] Substance Abuse/Addiction Treatment   [facility] Organization         Address  Phone  Notes  [facility]  [phone]   Domenic Schwab, PhD [address] [place], [place]   [phone] or [phone]   [place], [place] [phone]   [place] [address], [place], [place] [phone] Insurance/Medicaid/sponsorship through [employer] and Families [address] [place] 741  [place], [place] [phone] [place], [place] [phone]    Dr. Adele Schilder  [phone]   [facility] Dept. 1) [address] [place] 2)  [place] 3)  [place] [address], [place] [phone]  [phone]   [place] [phone] or [phone] (After Hours)

## 2013-07-26 NOTE — ED Notes (Signed)
Called main lab, they are working on urine preg.

## 2013-07-26 NOTE — ED Notes (Signed)
Amber phlebotomy attempted stick without success

## 2013-07-26 NOTE — ED Provider Notes (Signed)
CSN: [id number]     Arrival date & time 07/26/13  1603 History   First MD Initiated Contact with Patient 07/26/13 1604     Chief Complaint  Patient presents with  . Abdominal Pain     (Consider location/radiation/quality/duration/timing/severity/associated sxs/prior Treatment) The history is provided by the patient. No language interpreter was used.  Tina Saunders is a 28 y/o F with PMhx of PCOS, anxiety, depression, rhabdosarcoma, HTN, obesity presenting to the ED with abdominal pain that started yesterday morning at approximately 10:00 AM-reported that she was lying in bed when she noticed the discomfort. Stated the pain is localized to the right upper quadrant described as a sharp, dull pain that is constant without radiation. Stated that she started to experience nausea all along with at least 7-8 episodes of emesis-NB/NB between yesterday and today. Stated that she's been unable to keep any food or fluids down. Stated that since she has not been drinking any food she's been feeling dizzy and weak. Reported that her last menstrual cycle was finished on Wednesday and only lasted approximately 4 days. Stated that back in 2012 she had a cholecystectomy. Denied fever, chills, chest pain, shortness of breath, difficulty breathing, diarrhea, melena, hematochezia, hematuria, urinary issues, vaginal pain, vaginal bleeding, vaginal discharge. PCP [facility] [place]   Past Medical History  Diagnosis Date  . Polycystic ovarian syndrome 07/01/2011    Patient report  . Anxiety   . Depression   . Cancer of abdominal wall   . Rhabdosarcoma   . Hypertension   . Obesity    Past Surgical History  Procedure Laterality Date  . Cholecystectomy    . Varicose vein surgery    . Ovarian cyst excision     Family History  Problem Relation Age of Onset  . Coronary artery disease Maternal Grandmother   . Diabetes type II Maternal Grandmother   . Cancer Maternal  Grandmother   . Hypertension Mother   . Hypertension Father    History  Substance Use Topics  . Smoking status: Never Smoker   . Smokeless tobacco: Never Used  . Alcohol Use: No   OB History   Grav Para Term Preterm Abortions TAB SAB Ect Mult Living   0              Review of Systems  Constitutional: Negative for fever and chills.  Respiratory: Negative for chest tightness and shortness of breath.   Cardiovascular: Negative for chest pain.  Gastrointestinal: Positive for nausea, vomiting and abdominal pain. Negative for diarrhea, constipation, blood in stool and anal bleeding.  Genitourinary: Negative for dysuria, hematuria, decreased urine volume, vaginal bleeding, vaginal discharge, vaginal pain and pelvic pain.  Musculoskeletal: Negative for back pain and neck pain.  Neurological: Positive for dizziness. Negative for weakness.      Allergies  Geodon; Haldol; Compazine; Morphine and related; and Toradol  Home Medications   Prior to Admission medications   Medication Sig Start Date End Date Taking? Authorizing Provider  acetaminophen (TYLENOL) 500 MG tablet Take 1,000 mg by mouth every 6 (six) hours as needed.   Yes Historical Provider, MD  busPIRone (BUSPAR) 15 MG tablet Take 15 mg by mouth 3 (three) times daily.   Yes Historical Provider, MD  gabapentin (NEURONTIN) 400 MG capsule Take 400 mg by mouth 3 (three) times daily.   Yes Historical Provider, MD  hydrOXYzine (VISTARIL) 25 MG capsule Take 50 mg by mouth 3 (three) times daily as needed for anxiety.  Yes Historical Provider, MD  Melatonin 3 MG TABS Take 3 mg by mouth daily as needed (for sleep).   Yes Historical Provider, MD  QUEtiapine (SEROQUEL XR) 400 MG 24 hr tablet Take 800 mg by mouth at bedtime.   Yes Historical Provider, MD  sertraline (ZOLOFT) 50 MG tablet Take 150 mg by mouth daily.   Yes Historical Provider, MD  ondansetron (ZOFRAN) 4 MG tablet Take 1 tablet (4 mg total) by mouth every 6 (six) hours.  07/26/13   Tomesha Sargent, PA-C   BP 139/91  Pulse 105  Temp(Src) 97.4 F (36.3 C) (Oral)  Resp 16  SpO2 96%  LMP 07/16/2013 Physical Exam  Nursing note and vitals reviewed. Constitutional: She is oriented to person, place, and time. She appears well-developed and well-nourished. No distress.  HENT:  Head: Normocephalic and atraumatic.  Mouth/Throat: No oropharyngeal exudate.  Dry mucous membranes noted  Eyes: Conjunctivae and EOM are normal. Pupils are equal, round, and reactive to light. Right eye exhibits no discharge. Left eye exhibits no discharge.  Neck: Normal range of motion. Neck supple. No tracheal deviation present.  Cardiovascular: Normal rate, regular rhythm and normal heart sounds.  Exam reveals no friction rub.   No murmur heard. Pulses:      Radial pulses are 2+ on the right side, and 2+ on the left side.       Dorsalis pedis pulses are 2+ on the right side, and 2+ on the left side.  Cap refill less than 3 seconds  Pulmonary/Chest: Effort normal and breath sounds normal. No respiratory distress. She has no wheezes. She has no rales.  Abdominal: Soft. Normal appearance and bowel sounds are normal. She exhibits no distension. There is tenderness in the right upper quadrant. There is no rebound, no guarding and no CVA tenderness.    Obese Bowel sounds normal active in all 4 quadrants Negative peritoneal signs Negative guarding or rigidity noted Discomfort upon palpation to the right upper quadrant  Musculoskeletal: Normal range of motion.  Full ROM to upper and lower extremities without difficulty noted, negative ataxia noted.  Lymphadenopathy:    She has no cervical adenopathy.  Neurological: She is alert and oriented to person, place, and time. No cranial nerve deficit. She exhibits normal muscle tone. Coordination normal.  Cranial nerves III-XII grossly intact Equal grip strength bilaterally Negative facial drooping Negative slurred speech Negative aphasia    Skin: Skin is warm and dry. No rash noted. She is not diaphoretic. No erythema.  Psychiatric: She has a normal mood and affect. Her behavior is normal. Thought content normal.    ED Course  Procedures (including critical care time)  10:29 PM This provider re-assessed the patient. Patient able to tolerate fluids PO - negative episodes of emesis while in the ED setting. Discussed plan for discharge with patient.   Results for orders placed during the hospital encounter of 07/26/13  CBC WITH DIFFERENTIAL      Result Value Ref Range   WBC 7.5  4.0 - 10.5 K/uL   RBC 4.17  3.87 - 5.11 MIL/uL   Hemoglobin 11.8 (*) 12.0 - 15.0 g/dL   HCT 35.5 (*) 36.0 - 46.0 %   MCV 85.1  78.0 - 100.0 fL   MCH 28.3  26.0 - 34.0 pg   MCHC 33.2  30.0 - 36.0 g/dL   RDW 13.8  11.5 - 15.5 %   Platelets 199  150 - 400 K/uL   Neutrophils Relative % 65  43 -  77 %   Neutro Abs 4.9  1.7 - 7.7 K/uL   Lymphocytes Relative 24  12 - 46 %   Lymphs Abs 1.8  0.7 - 4.0 K/uL   Monocytes Relative 4  3 - 12 %   Monocytes Absolute 0.3  0.1 - 1.0 K/uL   Eosinophils Relative 7 (*) 0 - 5 %   Eosinophils Absolute 0.5  0.0 - 0.7 K/uL   Basophils Relative 0  0 - 1 %   Basophils Absolute 0.0  0.0 - 0.1 K/uL  COMPREHENSIVE METABOLIC PANEL      Result Value Ref Range   Sodium 138  137 - 147 mEq/L   Potassium 3.9  3.7 - 5.3 mEq/L   Chloride 99  96 - 112 mEq/L   CO2 23  19 - 32 mEq/L   Glucose, Bld 141 (*) 70 - 99 mg/dL   BUN 9  6 - 23 mg/dL   Creatinine, Ser 0.53  0.50 - 1.10 mg/dL   Calcium 8.7  8.4 - 10.5 mg/dL   Total Protein 6.9  6.0 - 8.3 g/dL   Albumin 3.6  3.5 - 5.2 g/dL   AST 68 (*) 0 - 37 U/L   ALT 50 (*) 0 - 35 U/L   Alkaline Phosphatase 95  39 - 117 U/L   Total Bilirubin 0.2 (*) 0.3 - 1.2 mg/dL   GFR calc non Af Amer >90  >90 mL/min   GFR calc Af Amer >90  >90 mL/min   Anion gap 16 (*) 5 - 15  LIPASE, BLOOD      Result Value Ref Range   Lipase 43  11 - 59 U/L  URINALYSIS, ROUTINE W REFLEX MICROSCOPIC       Result Value Ref Range   Color, Urine YELLOW  YELLOW   APPearance CLOUDY (*) CLEAR   Specific Gravity, Urine 1.027  1.005 - 1.030   pH 5.5  5.0 - 8.0   Glucose, UA NEGATIVE  NEGATIVE mg/dL   Hgb urine dipstick NEGATIVE  NEGATIVE   Bilirubin Urine NEGATIVE  NEGATIVE   Ketones, ur NEGATIVE  NEGATIVE mg/dL   Protein, ur NEGATIVE  NEGATIVE mg/dL   Urobilinogen, UA 0.2  0.0 - 1.0 mg/dL   Nitrite NEGATIVE  NEGATIVE   Leukocytes, UA MODERATE (*) NEGATIVE  URINE MICROSCOPIC-ADD ON      Result Value Ref Range   Squamous Epithelial / LPF MANY (*) RARE   WBC, UA 3-6  <3 WBC/hpf   Bacteria, UA MANY (*) RARE  PREGNANCY, URINE      Result Value Ref Range   Preg Test, Ur NEGATIVE  NEGATIVE    Labs Review Labs Reviewed  CBC WITH DIFFERENTIAL - Abnormal; Notable for the following:    Hemoglobin 11.8 (*)    HCT 35.5 (*)    Eosinophils Relative 7 (*)    All other components within normal limits  COMPREHENSIVE METABOLIC PANEL - Abnormal; Notable for the following:    Glucose, Bld 141 (*)    AST 68 (*)    ALT 50 (*)    Total Bilirubin 0.2 (*)    Anion gap 16 (*)    All other components within normal limits  URINALYSIS, ROUTINE W REFLEX MICROSCOPIC - Abnormal; Notable for the following:    APPearance CLOUDY (*)    Leukocytes, UA MODERATE (*)    All other components within normal limits  URINE MICROSCOPIC-ADD ON - Abnormal; Notable for the following:    Squamous Epithelial /  LPF MANY (*)    Bacteria, UA MANY (*)    All other components within normal limits  LIPASE, BLOOD  PREGNANCY, URINE  POC URINE PREG, ED    Imaging Review US Abdomen Complete  07/26/2013   CLINICAL DATA:  Right upper quadrant pain; history of cholecystectomy ; obesity  EXAM: ULTRASOUND ABDOMEN COMPLETE  COMPARISON:  Abdominal and pelvic CT scan dated April 29, 2013.  FINDINGS: Gallbladder:  Surgically absent. No tenderness in the right upper quadrant reported  Common bile duct:  Diameter: 4.2 mm  Liver:  There is no  focal hepatic mass nor ductal dilation. Mildly increased echotexture is consistent with known steatosis  IVC:  No abnormality visualized.  Pancreas:  The pancreas is obscured by bowel gas.  Spleen:  Size and appearance within normal limits.  Right Kidney:  Length: 12.4 cm. Echogenicity within normal limits. No mass or hydronephrosis visualized.  Left Kidney:  Length: 13 cm. Echogenicity within normal limits. No mass or hydronephrosis visualized.  Abdominal aorta:  Bowel gas limits visualization but no aneurysm is demonstrated.  Other findings:  There is no ascites.  IMPRESSION: There is no acute intra-abdominal abnormality.   Electronically Signed   By: David  Martinique   On: 07/26/2013 17:56   Ct Abdomen Pelvis W Contrast  07/26/2013   CLINICAL DATA:  Right-sided abdominal pain with nausea and vomiting  EXAM: CT ABDOMEN AND PELVIS WITH CONTRAST  TECHNIQUE: Multidetector CT imaging of the abdomen and pelvis was performed using the standard protocol following bolus administration of intravenous contrast.  CONTRAST:  158mL OMNIPAQUE IOHEXOL 300 MG/ML  SOLN  COMPARISON:  04/29/2013  FINDINGS: The lung bases are free of acute infiltrate or sizable effusion.  Diffuse fatty infiltration of the liver is again identified. The gallbladder has been surgically removed. The spleen, adrenal glands and pancreas are all normal in their CT appearance. The kidneys demonstrate a normal enhancement pattern. No renal calculi or obstructive changes are noted. The appendix is well visualized and within normal limits.  The bladder is well distended. The uterus is unremarkable. The previously seen right ovarian cystic lesion is again identified and is decreased in size from the prior exam. It now measures 3.9 x 2.1 cm in greatest dimension. No pelvic sidewall abnormality is seen. No lymphadenopathy is noted.  IMPRESSION: Interval decrease in size of a right ovarian cystic lesion.  No acute abnormality is noted.  Stable fatty infiltration of  the liver.   Electronically Signed   By: Inez Catalina M.D.   On: 07/26/2013 19:59     EKG Interpretation None      MDM   Final diagnoses:  Right upper quadrant pain  Nausea and vomiting in adult    Medications  0.9 %  sodium chloride infusion ( Intravenous New Bag/Given 07/26/13 1809)  ondansetron (ZOFRAN) injection 4 mg (4 mg Intravenous Given 07/26/13 1918)  HYDROmorphone (DILAUDID) injection 0.5 mg (0.5 mg Intravenous Given 07/26/13 1918)  iohexol (OMNIPAQUE) 300 MG/ML solution 100 mL (100 mLs Intravenous Contrast Given 07/26/13 1940)  ondansetron (ZOFRAN) injection 4 mg (4 mg Intravenous Given 07/26/13 2119)  HYDROmorphone (DILAUDID) injection 0.5 mg (0.5 mg Intravenous Given 07/26/13 2119)   Filed Vitals:   07/26/13 2010 07/26/13 2011 07/26/13 2033 07/26/13 2100  BP: 129/76  120/66 139/91  Pulse:  103  105  Temp:      TempSrc:      Resp:  18 20 16   SpO2:  96% 94% 96%   CBC negative elevated  white blood cell count-negative left shift or leukocytosis noted. Hemoglobin mildly low at 11.8, hematocrit 35.5-when compared to approximately 3 months ago hemoglobin was 12.8 and hematocrit was 37.6. CMP noted kidneys function well. Glucose 141 with an anion gap of 16.0 mEq per liter. Mild bump in AST and ALT noted with AST of 68 and ALT of 50. When compared to previous labs-appears to be that patient's AST and ALT is mildly elevated. Lipase negative elevation. Urinalysis noted moderate leukocytes with white blood cell count of 3-6 with many squamous cells and many bacteria-cannot rule out possible contaminated specimen. Urine pregnancy negative. Abdominal ultrasound negative for acute intra-abdominal processes. CT abdomen and pelvis with contrast noted interval decrease in size of right ovarian cystic lesion with no acute abnormalities noted. Stable fatty infiltration of liver noted. Patient able to tolerate fluids PO without episodes of emesis while in the ED setting. Pain medications administered in  the ED setting. Doubt pancreatitis. Doubt acute abdominal processes. Doubt appendicitis. Doubt ectopic pregnancy. Patient reported that she is feeling better. Discussed case with attending physician who agreed to plan of discharge. Patient stable, afebrile. Patient not septic appearing. Discharged patient. Discharge patient with Zofran for nausea control. Discussed with patient to rest and stay hydrated. Discussed with patient to continue with a light diet. Referred patient to [facility], GI, and Surgery. Discussed with patient to closely monitor symptoms and if symptoms are to worsen or change to report back to the ED - strict return instructions given.  Patient agreed to plan of care, understood, all questions answered.   Jamse Mead, PA-C 07/27/13 [place], PA-C 07/27/13 [phone]

## 2013-07-26 NOTE — ED Notes (Signed)
Pt c/o right upper quadrant abdominal pain and nausea and vomiting starting yesterday evening. Pt denies CP, SOB, diarrhea, or bloody stools.  LMP wednesday

## 2013-07-26 NOTE — ED Notes (Signed)
Pt returned from radiology.

## 2013-07-26 NOTE — ED Notes (Signed)
Pt given something to drink.  °

## 2013-07-27 NOTE — ED Provider Notes (Signed)
Medical screening examination/treatment/procedure(s) were performed by non-physician practitioner and as supervising physician I was immediately available for consultation/collaboration.   EKG Interpretation None        Neta Ehlers, MD 07/27/13 1209

## 2013-07-28 LAB — URINE CULTURE: Colony Count: >=100000

## 2013-08-14 ENCOUNTER — Emergency Department (HOSPITAL_COMMUNITY)
Admission: EM | Admit: 2013-08-14 | Discharge: 2013-08-14 | Discharge status: Against Medical Advice | Payer: Medicaid Other | Attending: Emergency Medicine | Admitting: Emergency Medicine

## 2013-08-14 ENCOUNTER — Encounter (HOSPITAL_COMMUNITY): Payer: Self-pay | Admitting: Emergency Medicine

## 2013-08-14 DIAGNOSIS — Y92009 Unspecified place in unspecified non-institutional (private) residence as the place of occurrence of the external cause: Secondary | ICD-10-CM | POA: Diagnosis not present

## 2013-08-14 DIAGNOSIS — R296 Repeated falls: Secondary | ICD-10-CM | POA: Insufficient documentation

## 2013-08-14 DIAGNOSIS — I10 Essential (primary) hypertension: Secondary | ICD-10-CM | POA: Diagnosis not present

## 2013-08-14 DIAGNOSIS — IMO0002 Reserved for concepts with insufficient information to code with codable children: Secondary | ICD-10-CM | POA: Diagnosis present

## 2013-08-14 DIAGNOSIS — E669 Obesity, unspecified: Secondary | ICD-10-CM | POA: Insufficient documentation

## 2013-08-14 DIAGNOSIS — Y939 Activity, unspecified: Secondary | ICD-10-CM | POA: Insufficient documentation

## 2013-08-14 HISTORY — DX: Bipolar disorder, unspecified: F31.9

## 2013-08-14 HISTORY — DX: Schizophrenia, unspecified: F20.9

## 2013-08-14 NOTE — ED Notes (Signed)
Pt left ama, placed her buzzer on the desk and walked away while this RN was talking with another pt, pt seen leaving ED with a steady gait

## 2013-08-14 NOTE — ED Notes (Signed)
Pt. lost balanced  fell at home yesterday morning " my legs gave out" , no LOC / ambulatory , alert and oriented , reports pain at lower back and bilateral leg pain . Denies hematuria or urinary discomfort. Tina Saunders

## 2013-08-24 ENCOUNTER — Emergency Department (HOSPITAL_COMMUNITY): Payer: Medicaid Other

## 2013-08-24 ENCOUNTER — Encounter (HOSPITAL_COMMUNITY): Payer: Self-pay | Admitting: Emergency Medicine

## 2013-08-24 ENCOUNTER — Emergency Department (HOSPITAL_COMMUNITY)
Admission: EM | Admit: 2013-08-24 | Discharge: 2013-08-24 | Disposition: A | Discharge status: Home / Self Care | Payer: Medicaid Other | Attending: Emergency Medicine | Admitting: Emergency Medicine

## 2013-08-24 DIAGNOSIS — Z79899 Other long term (current) drug therapy: Secondary | ICD-10-CM | POA: Insufficient documentation

## 2013-08-24 DIAGNOSIS — K7689 Other specified diseases of liver: Secondary | ICD-10-CM | POA: Diagnosis not present

## 2013-08-24 DIAGNOSIS — R1011 Right upper quadrant pain: Secondary | ICD-10-CM | POA: Insufficient documentation

## 2013-08-24 DIAGNOSIS — F209 Schizophrenia, unspecified: Secondary | ICD-10-CM | POA: Diagnosis not present

## 2013-08-24 DIAGNOSIS — K7581 Nonalcoholic steatohepatitis (NASH): Secondary | ICD-10-CM

## 2013-08-24 DIAGNOSIS — Z3202 Encounter for pregnancy test, result negative: Secondary | ICD-10-CM | POA: Insufficient documentation

## 2013-08-24 DIAGNOSIS — F411 Generalized anxiety disorder: Secondary | ICD-10-CM | POA: Insufficient documentation

## 2013-08-24 DIAGNOSIS — I158 Other secondary hypertension: Secondary | ICD-10-CM | POA: Diagnosis not present

## 2013-08-24 DIAGNOSIS — Z862 Personal history of diseases of the blood and blood-forming organs and certain disorders involving the immune mechanism: Secondary | ICD-10-CM | POA: Insufficient documentation

## 2013-08-24 DIAGNOSIS — F319 Bipolar disorder, unspecified: Secondary | ICD-10-CM | POA: Insufficient documentation

## 2013-08-24 DIAGNOSIS — E669 Obesity, unspecified: Secondary | ICD-10-CM | POA: Diagnosis not present

## 2013-08-24 DIAGNOSIS — Z8639 Personal history of other endocrine, nutritional and metabolic disease: Secondary | ICD-10-CM | POA: Insufficient documentation

## 2013-08-24 DIAGNOSIS — Z8589 Personal history of malignant neoplasm of other organs and systems: Secondary | ICD-10-CM | POA: Insufficient documentation

## 2013-08-24 DIAGNOSIS — I159 Secondary hypertension, unspecified: Secondary | ICD-10-CM

## 2013-08-24 LAB — URINALYSIS, ROUTINE W REFLEX MICROSCOPIC
Bilirubin Urine: NEGATIVE
GLUCOSE, UA: NEGATIVE mg/dL
Ketones, ur: NEGATIVE mg/dL
Nitrite: NEGATIVE
PH: 5.5 (ref 5.0–8.0)
PROTEIN: 30 mg/dL — AB
Specific Gravity, Urine: 1.029 (ref 1.005–1.030)
Urobilinogen, UA: 0.2 mg/dL (ref 0.0–1.0)

## 2013-08-24 LAB — CBC WITH DIFFERENTIAL/PLATELET
Basophils Absolute: 0 10*3/uL (ref 0.0–0.1)
Basophils Relative: 0 % (ref 0–1)
EOS ABS: 0.6 10*3/uL (ref 0.0–0.7)
EOS PCT: 7 % — AB (ref 0–5)
HEMATOCRIT: 37.6 % (ref 36.0–46.0)
Hemoglobin: 12.4 g/dL (ref 12.0–15.0)
Lymphocytes Relative: 25 % (ref 12–46)
Lymphs Abs: 2.1 10*3/uL (ref 0.7–4.0)
MCH: 28 pg (ref 26.0–34.0)
MCHC: 33 g/dL (ref 30.0–36.0)
MCV: 84.9 fL (ref 78.0–100.0)
MONO ABS: 0.5 10*3/uL (ref 0.1–1.0)
Monocytes Relative: 6 % (ref 3–12)
Neutro Abs: 5.2 10*3/uL (ref 1.7–7.7)
Neutrophils Relative %: 62 % (ref 43–77)
Platelets: 220 10*3/uL (ref 150–400)
RBC: 4.43 MIL/uL (ref 3.87–5.11)
RDW: 14.1 % (ref 11.5–15.5)
WBC: 8.3 10*3/uL (ref 4.0–10.5)

## 2013-08-24 LAB — URINE MICROSCOPIC-ADD ON

## 2013-08-24 LAB — COMPREHENSIVE METABOLIC PANEL
ALK PHOS: 106 U/L (ref 39–117)
ALT: 40 U/L — AB (ref 0–35)
ANION GAP: 14 (ref 5–15)
AST: 58 U/L — ABNORMAL HIGH (ref 0–37)
Albumin: 3.8 g/dL (ref 3.5–5.2)
BUN: 7 mg/dL (ref 6–23)
CHLORIDE: 99 meq/L (ref 96–112)
CO2: 24 meq/L (ref 19–32)
Calcium: 9.3 mg/dL (ref 8.4–10.5)
Creatinine, Ser: 0.47 mg/dL — ABNORMAL LOW (ref 0.50–1.10)
GFR calc non Af Amer: >90 mL/min (ref >90)
GLUCOSE: 144 mg/dL — AB (ref 70–99)
POTASSIUM: 4 meq/L (ref 3.7–5.3)
SODIUM: 137 meq/L (ref 137–147)
Total Protein: 7.4 g/dL (ref 6.0–8.3)

## 2013-08-24 LAB — PREGNANCY, URINE: Preg Test, Ur: NEGATIVE

## 2013-08-24 LAB — WET PREP, GENITAL
Clue Cells Wet Prep HPF POC: NONE SEEN
Trich, Wet Prep: NONE SEEN
YEAST WET PREP: NONE SEEN

## 2013-08-24 LAB — RPR

## 2013-08-24 LAB — LIPASE, BLOOD: Lipase: 33 U/L (ref 11–59)

## 2013-08-24 LAB — HIV ANTIBODY (ROUTINE TESTING W REFLEX): HIV: NONREACTIVE

## 2013-08-24 MED ORDER — HYDROMORPHONE HCL PF 1 MG/ML IJ SOLN
1.0000 mg | Freq: Once | INTRAMUSCULAR | Status: AC
  Administered 2013-08-24: 1 mg via INTRAVENOUS
  Filled 2013-08-24: qty 1

## 2013-08-24 MED ORDER — ONDANSETRON HCL 4 MG/2ML IJ SOLN
4.0000 mg | Freq: Once | INTRAMUSCULAR | Status: AC
  Administered 2013-08-24: 4 mg via INTRAVENOUS
  Filled 2013-08-24: qty 2

## 2013-08-24 MED ORDER — EMETROL 1.87-1.87-21.5 PO SOLN: 10.0000 mL | ORAL | Status: DC | PRN

## 2013-08-24 NOTE — ED Provider Notes (Signed)
CSN: [id number]     Arrival date & time 08/24/13  1324 History   First MD Initiated Contact with Patient 08/24/13 [phone]     Chief Complaint  Patient presents with  . Abdominal Pain     (Consider location/radiation/quality/duration/timing/severity/associated sxs/prior Treatment) HPI  Tina Saunders Is a 28 year old female with a past medical history of PCO S., anxiety, depression, bladder sarcoma, hypertension, obesity, schizophrenia and bipolar the emergency department with chief complaint of right sided abdominal pain. Patient has been seen for this in the past. Her CT scan done a month ago on 07/26/2013 show a right ovarian cyst decreasing in size, as well as stable hepatic steatosis. Patient states that she was having some chronic ongoing aching pain in the right quadrant until 2 days ago when she began having severe pain in the right upper and lower quadrant associated with nausea, vomiting which she describes as bilious in nature. She states she has vomited approximately 5 times today. The patient also began her menstrual period 2 days ago. She denies any urinary or vaginal symptoms.  Denies fevers, chills, myalgias, arthralgias. Denies DOE, SOB, chest tightness or pressure, radiation to left arm, jaw or back, or diaphoresis. Denies dysuria, flank pain, suprapubic pain, frequency, urgency, or hematuria. Denies headaches, light headedness, weakness, visual disturbances. Denies diarrhea or constipation.     Past Medical History  Diagnosis Date  . Polycystic ovarian syndrome 07/01/2011    Patient report  . Anxiety   . Depression   . Cancer of abdominal wall   . Rhabdosarcoma   . Hypertension   . Obesity   . Schizophrenia   . Bipolar 1 disorder   . Obesity    Past Surgical History  Procedure Laterality Date  . Cholecystectomy    . Varicose vein surgery    . Ovarian cyst excision     Family History  Problem Relation Age of Onset  . Coronary artery disease Maternal Grandmother    . Diabetes type II Maternal Grandmother   . Cancer Maternal Grandmother   . Hypertension Mother   . Hypertension Father    History  Substance Use Topics  . Smoking status: Never Smoker   . Smokeless tobacco: Never Used  . Alcohol Use: No   OB History   Grav Para Term Preterm Abortions TAB SAB Ect Mult Living   0              Review of Systems  Ten systems reviewed and are negative for acute change, except as noted in the HPI.    Allergies  Geodon; Haldol; Compazine; Morphine and related; and Toradol  Home Medications   Prior to Admission medications   Medication Sig Start Date End Date Taking? Authorizing Provider  busPIRone (BUSPAR) 15 MG tablet Take 15 mg by mouth 3 (three) times daily.   Yes Historical Provider, MD  gabapentin (NEURONTIN) 400 MG capsule Take 400 mg by mouth 3 (three) times daily.   Yes Historical Provider, MD  hydrOXYzine (VISTARIL) 25 MG capsule Take 50 mg by mouth 3 (three) times daily as needed for anxiety.    Yes Historical Provider, MD  Melatonin 3 MG TABS Take 3 mg by mouth daily as needed (for sleep).   Yes Historical Provider, MD  QUEtiapine (SEROQUEL XR) 400 MG 24 hr tablet Take 800 mg by mouth at bedtime.   Yes Historical Provider, MD  sertraline (ZOLOFT) 50 MG tablet Take 150 mg by mouth daily.   Yes Historical Provider, MD  BP 126/85  Pulse 91  Temp(Src) 97.5 F (36.4 C) (Oral)  Resp 20  Ht 5\' 4"  (1.626 m)  Wt 273 lb (123.832 kg)  BMI 46.84 kg/m2  SpO2 95%  LMP 08/24/2013 Physical Exam  Nursing note and vitals reviewed. Constitutional: She is oriented to person, place, and time. She appears well-developed and well-nourished. No distress.  HENT:  Head: Normocephalic and atraumatic.  Eyes: Conjunctivae are normal. No scleral icterus.  Neck: Normal range of motion.  Cardiovascular: Normal rate, regular rhythm and normal heart sounds.  Exam reveals no gallop and no friction rub.   No murmur heard. Pulmonary/Chest: Effort normal and  breath sounds normal. No respiratory distress.  Abdominal: Soft. Bowel sounds are normal. She exhibits no distension and no mass. There is tenderness. There is no guarding.  Morbidly obese abdomen. Exquisitely tender in the right upper and lower quadrants.  Genitourinary:  Pelvic exam: normal external genitalia, vulva, vagina, cervix, uterus and adnexa. Blood from cervical os   Neurological: She is alert and oriented to person, place, and time.  Skin: Skin is warm and dry. She is not diaphoretic.    ED Course  Procedures (including critical care time) Labs Review Labs Reviewed  WET PREP, GENITAL - Abnormal; Notable for the following:    WBC, Wet Prep HPF POC FEW (*)    All other components within normal limits  CBC WITH DIFFERENTIAL - Abnormal; Notable for the following:    Eosinophils Relative 7 (*)    All other components within normal limits  COMPREHENSIVE METABOLIC PANEL - Abnormal; Notable for the following:    Glucose, Bld 144 (*)    Creatinine, Ser 0.47 (*)    AST 58 (*)    ALT 40 (*)    Total Bilirubin <0.2 (*)    All other components within normal limits  URINALYSIS, ROUTINE W REFLEX MICROSCOPIC - Abnormal; Notable for the following:    Color, Urine AMBER (*)    APPearance CLOUDY (*)    Hgb urine dipstick LARGE (*)    Protein, ur 30 (*)    Leukocytes, UA SMALL (*)    All other components within normal limits  URINE MICROSCOPIC-ADD ON - Abnormal; Notable for the following:    Bacteria, UA FEW (*)    All other components within normal limits  GC/CHLAMYDIA PROBE AMP  LIPASE, BLOOD  PREGNANCY, URINE  RPR  HIV ANTIBODY (ROUTINE TESTING)    Imaging Review No results found.   EKG Interpretation None      MDM   Final diagnoses:  None   9:14 AM BP 126/85  Pulse 91  Temp(Src) 97.5 F (36.4 C) (Oral)  Resp 20  Ht 5\' 4"  (1.626 m)  Wt 273 lb (123.832 kg)  BMI 46.84 kg/m2  SpO2 95%  LMP 08/24/2013 Is benign pelvic exam, elevated LFTs and right upper  quadrant pain. Doubt any ovarian issues considering her pelvic exam without any adnexal pain. I did obtain an ultrasound which shows worsening and now advanced hepatic steatosis. This appears to be a change from one month ago on the CT scan. This may account for the patient's severe right upper quadrant pain.  Filed Vitals:   08/24/13 0519 08/24/13 0545 08/24/13 0645 08/24/13 0920  BP: 151/105 142/89 126/85 125/71  Pulse: 88 87 91 73  Temp: 97.5 F (36.4 C)     TempSrc: Oral     Resp: 20   18  Height: 5\' 4"  (1.626 m)     Weight: 273  lb (123.832 kg)     SpO2: 99% 97% 95% 96%   Patient seen in shared visit with Dr Winfred Leeds. She has negative wet prep. Benign pelvic exam CMP shows hyperglycemia and transaminitis. US shows worsening NASH and hepatomegaly which I believe is the cause of her pain.  Patient has a follow up with her PCP tomorrow. No active vomting. I will d/c with antiemetics. Patietn advised not to use ETOH/ Tylenol.  I personally reviewed the imaging tests through PACS system. I have reviewed and interpreted Lab values. I reviewed available ER/hospitalization records through the EMR  Patient / Family / Caregiver informed of clinical course, understand medical decision-making process, and agree with plan.  The patient appears reasonably screened and/or stabilized for discharge and I doubt any other medical condition or other [facility] requiring further screening, evaluation, or treatment in the ED at this time prior to discharge.    Margarita Mail, PA-C 08/26/13 [phone]

## 2013-08-24 NOTE — ED Notes (Signed)
The pt has had rt lower abd pain for 2 days with nv.  lmp now

## 2013-08-24 NOTE — Discharge Instructions (Signed)
Please follow up as soon as possible with your primary care doctor. avoid Tylenol or tylenol containing products.  Abdominal (belly) pain can be caused by many things. Your caregiver performed an examination and possibly ordered blood/urine tests and imaging (CT scan, x-rays, ultrasound). Many cases can be observed and treated at home after initial evaluation in the emergency department. Even though you are being discharged home, abdominal pain can be unpredictable. Therefore, you need a repeated exam if your pain does not resolve, returns, or worsens. Most patients with abdominal pain don't have to be admitted to the hospital or have surgery, but serious problems like appendicitis and gallbladder attacks can start out as nonspecific pain. Many abdominal conditions cannot be diagnosed in one visit, so follow-up evaluations are very important. SEEK IMMEDIATE MEDICAL ATTENTION IF: The pain does not go away or becomes severe.  A temperature above 101 develops.  Repeated vomiting occurs (multiple episodes).  The pain becomes localized to portions of the abdomen. The right side could possibly be appendicitis. In an adult, the left lower portion of the abdomen could be colitis or diverticulitis.  Blood is being passed in stools or vomit (bright red or black tarry stools).  Return also if you develop chest pain, difficulty breathing, dizziness or fainting, or become confused, poorly responsive, or inconsolable (young children). Fatty Liver Fatty liver is the accumulation of fat in liver cells. It is also called hepatosteatosis or steatohepatitis. It is normal for your liver to contain some fat. If fat is more than 5 to 10% of your liver's weight, you have fatty liver.  There are often no symptoms (problems) for years while damage is still occurring. People often learn about their fatty liver when they have medical tests for other reasons. Fat can damage your liver for years or even decades without causing  problems. When it becomes severe, it can cause fatigue, weight loss, weakness, and confusion. This makes you more likely to develop more serious liver problems. The liver is the largest organ in the body. It does a lot of work and often gives no warning signs when it is sick until late in a disease. The liver has many important jobs including:  Breaking down foods.  Storing vitamins, iron, and other minerals.  Making proteins.  Making bile for food digestion.  Breaking down many products including medications, alcohol and some poisons. CAUSES  There are a number of different conditions, medications, and poisons that can cause a fatty liver. Eating too many calories causes fat to build up in the liver. Not processing and breaking fats down normally may also cause this. Certain conditions, such as obesity, diabetes, and high triglycerides also cause this. Most fatty liver patients tend to be middle-aged and over weight.  Some causes of fatty liver are:  Alcohol over consumption.  Malnutrition.  Steroid use.  Valproic acid toxicity.  Obesity.  Cushing's syndrome.  Poisons.  Tetracycline in high dosages.  Pregnancy.  Diabetes.  Hyperlipidemia.  Rapid weight loss. Some people develop fatty liver even having none of these conditions. SYMPTOMS  Fatty liver most often causes no problems. This is called asymptomatic.  It can be diagnosed with blood tests and also by a liver biopsy.  It is one of the most common causes of minor elevations of liver enzymes on routine blood tests.  Specialized Imaging of the liver using ultrasound, CT (computed tomography) scan, or MRI (magnetic resonance imaging) can suggest a fatty liver but a biopsy is needed to confirm it.  A biopsy involves taking a small sample of liver tissue. This is done by using a needle. It is then looked at under a microscope by a specialist. TREATMENT  It is important to treat the cause. Simple fatty liver without  a medical reason may not need treatment.  Weight loss, fat restriction, and exercise in overweight patients produces inconsistent results but is worth trying.  Fatty liver due to alcohol toxicity may not improve even with stopping drinking.  Good control of diabetes may reduce fatty liver.  Lower your triglycerides through diet, medication or both.  Eat a balanced, healthy diet.  Increase your physical activity.  Get regular checkups from a liver specialist.  There are no medical or surgical treatments for a fatty liver or NASH, but improving your diet and increasing your exercise may help prevent or reverse some of the damage. PROGNOSIS  Fatty liver may cause no damage or it can lead to an inflammation of the liver. This is, called steatohepatitis. When it is linked to alcohol abuse, it is called alcoholic steatohepatitis. It often is not linked to alcohol. It is then called nonalcoholic steatohepatitis, or NASH. Over time the liver may become scarred and hardened. This condition is called cirrhosis. Cirrhosis is serious and may lead to liver failure or cancer. NASH is one of the leading causes of cirrhosis. About 10-20% of Americans have fatty liver and a smaller 2-5% has NASH. Document Released: 02/24/2005 Document Revised: 04/03/2011 Document Reviewed: 05/21/2013 [facility] Patient Information 2015 [place], [place]. This information is not intended to replace advice given to you by your health care provider. Make sure you discuss any questions you have with your health care provider.

## 2013-08-24 NOTE — ED Provider Notes (Signed)
Complains of right sided abdominal pain for 2 days. Constant. Treated with Tylenol and ibuprofen, with no relief. Discomfort is constant, mild at present. Worse with movement, improved with remaining still. She vomited 5 times since yesterday. Reports fever of 100.5 yesterday. Last bowel movement yesterday, normal. Patient reports she's vomited daily for the past several months and has abdominal pain daily for several months, however this pain is somewhat different. On exam no distrress. Lungs clear auscultation heart regular rate and rhythm abdomen morbidly obese, normal active bowel sounds, nontender. Note: Patient also reports that she's had pain in both arms and both legs for several months and she plans to ask her primary care physician, Dr. Tobie Lords for a referral to a pain management clinic. Cause of abdominal pain and nausea vomiting is presently unclear. She has a nonfocal abdominal exam. Pain seems chronic. Strongly doubt appendicitis. No psychosis no fever. Nausea and vomiting is chronic. Results for orders placed during the hospital encounter of 08/24/13  WET PREP, GENITAL      Result Value Ref Range   Yeast Wet Prep HPF POC NONE SEEN  NONE SEEN   Trich, Wet Prep NONE SEEN  NONE SEEN   Clue Cells Wet Prep HPF POC NONE SEEN  NONE SEEN   WBC, Wet Prep HPF POC FEW (*) NONE SEEN  CBC WITH DIFFERENTIAL      Result Value Ref Range   WBC 8.3  4.0 - 10.5 K/uL   RBC 4.43  3.87 - 5.11 MIL/uL   Hemoglobin 12.4  12.0 - 15.0 g/dL   HCT 37.6  36.0 - 46.0 %   MCV 84.9  78.0 - 100.0 fL   MCH 28.0  26.0 - 34.0 pg   MCHC 33.0  30.0 - 36.0 g/dL   RDW 14.1  11.5 - 15.5 %   Platelets 220  150 - 400 K/uL   Neutrophils Relative % 62  43 - 77 %   Neutro Abs 5.2  1.7 - 7.7 K/uL   Lymphocytes Relative 25  12 - 46 %   Lymphs Abs 2.1  0.7 - 4.0 K/uL   Monocytes Relative 6  3 - 12 %   Monocytes Absolute 0.5  0.1 - 1.0 K/uL   Eosinophils Relative 7 (*) 0 - 5 %   Eosinophils Absolute 0.6  0.0 - 0.7 K/uL    Basophils Relative 0  0 - 1 %   Basophils Absolute 0.0  0.0 - 0.1 K/uL  COMPREHENSIVE METABOLIC PANEL      Result Value Ref Range   Sodium 137  137 - 147 mEq/L   Potassium 4.0  3.7 - 5.3 mEq/L   Chloride 99  96 - 112 mEq/L   CO2 24  19 - 32 mEq/L   Glucose, Bld 144 (*) 70 - 99 mg/dL   BUN 7  6 - 23 mg/dL   Creatinine, Ser 0.47 (*) 0.50 - 1.10 mg/dL   Calcium 9.3  8.4 - 10.5 mg/dL   Total Protein 7.4  6.0 - 8.3 g/dL   Albumin 3.8  3.5 - 5.2 g/dL   AST 58 (*) 0 - 37 U/L   ALT 40 (*) 0 - 35 U/L   Alkaline Phosphatase 106  39 - 117 U/L   Total Bilirubin <0.2 (*) 0.3 - 1.2 mg/dL   GFR calc non Af Amer >90  >90 mL/min   GFR calc Af Amer >90  >90 mL/min   Anion gap 14  5 - 15  LIPASE, BLOOD  Result Value Ref Range   Lipase 33  11 - 59 U/L  PREGNANCY, URINE      Result Value Ref Range   Preg Test, Ur NEGATIVE  NEGATIVE  URINALYSIS, ROUTINE W REFLEX MICROSCOPIC      Result Value Ref Range   Color, Urine AMBER (*) YELLOW   APPearance CLOUDY (*) CLEAR   Specific Gravity, Urine 1.029  1.005 - 1.030   pH 5.5  5.0 - 8.0   Glucose, UA NEGATIVE  NEGATIVE mg/dL   Hgb urine dipstick LARGE (*) NEGATIVE   Bilirubin Urine NEGATIVE  NEGATIVE   Ketones, ur NEGATIVE  NEGATIVE mg/dL   Protein, ur 30 (*) NEGATIVE mg/dL   Urobilinogen, UA 0.2  0.0 - 1.0 mg/dL   Nitrite NEGATIVE  NEGATIVE   Leukocytes, UA SMALL (*) NEGATIVE  URINE MICROSCOPIC-ADD ON      Result Value Ref Range   Squamous Epithelial / LPF RARE  RARE   WBC, UA 3-6  <3 WBC/hpf   RBC / HPF 11-20  <3 RBC/hpf   Bacteria, UA FEW (*) RARE   Urine-Other MUCOUS PRESENT     US Abdomen Complete  08/24/2013   CLINICAL DATA:  Abdominal pain  EXAM: ULTRASOUND ABDOMEN COMPLETE  COMPARISON:  Prior CT abdomen/ pelvis 08/11/2013 ; prior abdominal ultrasound 07/26/2013  FINDINGS: Gallbladder:  Surgically absent.  Common bile duct:  Diameter: Within normal limits at 4.4 mm  Liver:  Diffusely echogenic hepatic parenchyma. Coarsening of the  echotexture. The adjacent renal parenchyma appears markedly hypoechoic in comparison to the liver. These findings are most consistent with advanced hepatic steatosis. The main portal vein remains patent with normal hepatopetal flow. The left hepatic lobe is relatively enlarged.  IVC:  No abnormality visualized.  Pancreas:  Obscured by large dense left hepatic lobe and obscuring bowel gas.  Spleen:  Size and appearance within normal limits.  Right Kidney:  Length: 12.6 cm. Echogenicity within normal limits. No mass or hydronephrosis visualized.  Left Kidney:  Length: 12.1 cm. Echogenicity within normal limits. No mass or hydronephrosis visualized.  Abdominal aorta:  No aneurysm visualized.  Other findings:  None.  IMPRESSION: 1. Hepatomegaly with advanced hepatic steatosis. Patient may be at risk for nonalcoholic steatohepatitis (NASH) which can lead to cirrhosis. 2. Surgical changes of prior cholecystectomy.   Electronically Signed   By: Jacqulynn Cadet M.D.   On: 08/24/2013 08:49   US Abdomen Complete  07/26/2013   CLINICAL DATA:  Right upper quadrant pain; history of cholecystectomy ; obesity  EXAM: ULTRASOUND ABDOMEN COMPLETE  COMPARISON:  Abdominal and pelvic CT scan dated April 29, 2013.  FINDINGS: Gallbladder:  Surgically absent. No tenderness in the right upper quadrant reported  Common bile duct:  Diameter: 4.2 mm  Liver:  There is no focal hepatic mass nor ductal dilation. Mildly increased echotexture is consistent with known steatosis  IVC:  No abnormality visualized.  Pancreas:  The pancreas is obscured by bowel gas.  Spleen:  Size and appearance within normal limits.  Right Kidney:  Length: 12.4 cm. Echogenicity within normal limits. No mass or hydronephrosis visualized.  Left Kidney:  Length: 13 cm. Echogenicity within normal limits. No mass or hydronephrosis visualized.  Abdominal aorta:  Bowel gas limits visualization but no aneurysm is demonstrated.  Other findings:  There is no ascites.   IMPRESSION: There is no acute intra-abdominal abnormality.   Electronically Signed   By: David  Martinique   On: 07/26/2013 17:56   Ct Abdomen Pelvis W Contrast  07/26/2013   CLINICAL DATA:  Right-sided abdominal pain with nausea and vomiting  EXAM: CT ABDOMEN AND PELVIS WITH CONTRAST  TECHNIQUE: Multidetector CT imaging of the abdomen and pelvis was performed using the standard protocol following bolus administration of intravenous contrast.  CONTRAST:  132mL OMNIPAQUE IOHEXOL 300 MG/ML  SOLN  COMPARISON:  04/29/2013  FINDINGS: The lung bases are free of acute infiltrate or sizable effusion.  Diffuse fatty infiltration of the liver is again identified. The gallbladder has been surgically removed. The spleen, adrenal glands and pancreas are all normal in their CT appearance. The kidneys demonstrate a normal enhancement pattern. No renal calculi or obstructive changes are noted. The appendix is well visualized and within normal limits.  The bladder is well distended. The uterus is unremarkable. The previously seen right ovarian cystic lesion is again identified and is decreased in size from the prior exam. It now measures 3.9 x 2.1 cm in greatest dimension. No pelvic sidewall abnormality is seen. No lymphadenopathy is noted.  IMPRESSION: Interval decrease in size of a right ovarian cystic lesion.  No acute abnormality is noted.  Stable fatty infiltration of the liver.   Electronically Signed   By: Inez Catalina M.D.   On: 07/26/2013 19:59     Orlie Dakin, MD 08/24/13 [phone]

## 2013-08-24 NOTE — ED Notes (Signed)
Tech Prepared Pelvic Cart.

## 2013-08-25 LAB — GC/CHLAMYDIA PROBE AMP
CT PROBE, AMP APTIMA: NEGATIVE
GC Probe RNA: NEGATIVE

## 2013-08-27 NOTE — ED Provider Notes (Signed)
Medical screening examination/treatment/procedure(s) were conducted as a shared visit with non-physician practitioner(s) and myself.  I personally evaluated the patient during the encounter.   EKG Interpretation None       Orlie Dakin, MD 08/27/13 [phone]

## 2013-09-09 ENCOUNTER — Encounter (HOSPITAL_COMMUNITY): Payer: Self-pay | Admitting: Emergency Medicine

## 2013-09-09 ENCOUNTER — Emergency Department (HOSPITAL_COMMUNITY)
Admission: EM | Admit: 2013-09-09 | Discharge: 2013-09-10 | Disposition: A | Discharge status: Home / Self Care | Payer: Medicaid Other | Attending: Emergency Medicine | Admitting: Emergency Medicine

## 2013-09-09 DIAGNOSIS — Z3202 Encounter for pregnancy test, result negative: Secondary | ICD-10-CM | POA: Diagnosis not present

## 2013-09-09 DIAGNOSIS — F209 Schizophrenia, unspecified: Secondary | ICD-10-CM | POA: Diagnosis not present

## 2013-09-09 DIAGNOSIS — F319 Bipolar disorder, unspecified: Secondary | ICD-10-CM | POA: Insufficient documentation

## 2013-09-09 DIAGNOSIS — Z8509 Personal history of malignant neoplasm of other digestive organs: Secondary | ICD-10-CM | POA: Diagnosis not present

## 2013-09-09 DIAGNOSIS — Z8589 Personal history of malignant neoplasm of other organs and systems: Secondary | ICD-10-CM | POA: Insufficient documentation

## 2013-09-09 DIAGNOSIS — E119 Type 2 diabetes mellitus without complications: Secondary | ICD-10-CM | POA: Insufficient documentation

## 2013-09-09 DIAGNOSIS — E669 Obesity, unspecified: Secondary | ICD-10-CM | POA: Insufficient documentation

## 2013-09-09 DIAGNOSIS — I10 Essential (primary) hypertension: Secondary | ICD-10-CM | POA: Diagnosis not present

## 2013-09-09 DIAGNOSIS — R1110 Vomiting, unspecified: Secondary | ICD-10-CM | POA: Diagnosis not present

## 2013-09-09 DIAGNOSIS — Z9089 Acquired absence of other organs: Secondary | ICD-10-CM | POA: Diagnosis not present

## 2013-09-09 DIAGNOSIS — F411 Generalized anxiety disorder: Secondary | ICD-10-CM | POA: Insufficient documentation

## 2013-09-09 DIAGNOSIS — Z79899 Other long term (current) drug therapy: Secondary | ICD-10-CM | POA: Insufficient documentation

## 2013-09-09 DIAGNOSIS — R1011 Right upper quadrant pain: Secondary | ICD-10-CM | POA: Insufficient documentation

## 2013-09-09 DIAGNOSIS — R109 Unspecified abdominal pain: Secondary | ICD-10-CM | POA: Diagnosis present

## 2013-09-09 DIAGNOSIS — R1031 Right lower quadrant pain: Secondary | ICD-10-CM

## 2013-09-09 LAB — URINALYSIS, ROUTINE W REFLEX MICROSCOPIC
Bilirubin Urine: NEGATIVE
Glucose, UA: NEGATIVE mg/dL
Hgb urine dipstick: NEGATIVE
Ketones, ur: NEGATIVE mg/dL
Nitrite: NEGATIVE
PH: 5.5 (ref 5.0–8.0)
Protein, ur: NEGATIVE mg/dL
Specific Gravity, Urine: 1.022 (ref 1.005–1.030)
UROBILINOGEN UA: 0.2 mg/dL (ref 0.0–1.0)

## 2013-09-09 LAB — COMPREHENSIVE METABOLIC PANEL
ALT: 38 U/L — ABNORMAL HIGH (ref 0–35)
ANION GAP: 15 (ref 5–15)
AST: 67 U/L — AB (ref 0–37)
Albumin: 3.7 g/dL (ref 3.5–5.2)
Alkaline Phosphatase: 83 U/L (ref 39–117)
BUN: 7 mg/dL (ref 6–23)
CALCIUM: 8.9 mg/dL (ref 8.4–10.5)
CO2: 19 meq/L (ref 19–32)
CREATININE: 0.46 mg/dL — AB (ref 0.50–1.10)
Chloride: 101 mEq/L (ref 96–112)
GFR calc Af Amer: >90 mL/min (ref >90)
Glucose, Bld: 114 mg/dL — ABNORMAL HIGH (ref 70–99)
Potassium: 3.8 mEq/L (ref 3.7–5.3)
Sodium: 135 mEq/L — ABNORMAL LOW (ref 137–147)
Total Bilirubin: 0.2 mg/dL — ABNORMAL LOW (ref 0.3–1.2)
Total Protein: 7.3 g/dL (ref 6.0–8.3)

## 2013-09-09 LAB — CBC WITH DIFFERENTIAL/PLATELET
BASOS ABS: 0 10*3/uL (ref 0.0–0.1)
Basophils Relative: 0 % (ref 0–1)
EOS PCT: 6 % — AB (ref 0–5)
Eosinophils Absolute: 0.5 10*3/uL (ref 0.0–0.7)
HCT: 37.9 % (ref 36.0–46.0)
Hemoglobin: 12.1 g/dL (ref 12.0–15.0)
Lymphocytes Relative: 23 % (ref 12–46)
Lymphs Abs: 1.7 10*3/uL (ref 0.7–4.0)
MCH: 28 pg (ref 26.0–34.0)
MCHC: 31.9 g/dL (ref 30.0–36.0)
MCV: 87.7 fL (ref 78.0–100.0)
MONO ABS: 0.4 10*3/uL (ref 0.1–1.0)
Monocytes Relative: 5 % (ref 3–12)
Neutro Abs: 4.8 10*3/uL (ref 1.7–7.7)
Neutrophils Relative %: 66 % (ref 43–77)
Platelets: 178 10*3/uL (ref 150–400)
RBC: 4.32 MIL/uL (ref 3.87–5.11)
RDW: 14.3 % (ref 11.5–15.5)
WBC: 7.3 10*3/uL (ref 4.0–10.5)

## 2013-09-09 LAB — URINE MICROSCOPIC-ADD ON

## 2013-09-09 LAB — POC URINE PREG, ED: Preg Test, Ur: NEGATIVE

## 2013-09-09 LAB — LIPASE, BLOOD: LIPASE: 36 U/L (ref 11–59)

## 2013-09-09 MED ORDER — LORAZEPAM 2 MG/ML IJ SOLN
1.0000 mg | Freq: Once | INTRAMUSCULAR | Status: AC
  Administered 2013-09-09: 1 mg via INTRAVENOUS
  Filled 2013-09-09: qty 1

## 2013-09-09 MED ORDER — KETOROLAC TROMETHAMINE 30 MG/ML IJ SOLN
30.0000 mg | Freq: Once | INTRAMUSCULAR | Status: AC
  Administered 2013-09-09: 30 mg via INTRAVENOUS
  Filled 2013-09-09: qty 1

## 2013-09-09 MED ORDER — HYDROMORPHONE HCL PF 1 MG/ML IJ SOLN
1.0000 mg | Freq: Once | INTRAMUSCULAR | Status: AC
  Administered 2013-09-10: 1 mg via INTRAVENOUS
  Filled 2013-09-09: qty 1

## 2013-09-09 MED ORDER — ONDANSETRON HCL 4 MG/2ML IJ SOLN
4.0000 mg | Freq: Once | INTRAMUSCULAR | Status: AC
  Administered 2013-09-10: 4 mg via INTRAVENOUS
  Filled 2013-09-09: qty 2

## 2013-09-09 NOTE — ED Notes (Signed)
Pt to have labs drawn in back with IV start

## 2013-09-09 NOTE — ED Provider Notes (Signed)
CSN: [id number]     Arrival date & time 09/09/13  1817 History   First MD Initiated Contact with Patient 09/09/13 2132     Chief Complaint  Patient presents with  . Abdominal Pain  . Emesis     (Consider location/radiation/quality/duration/timing/severity/associated sxs/prior Treatment) HPI Tina Saunders is a(n) 28 y.o. female who presents to the ED with CC of abdominal pain. 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 Patient has been seen in the emergency department multiple times for complaints of abdominal pain. She was last seen by myself 2 weeks ago for the same complaint. Patient complains of abdominal pain anomaly in the right lower quadrant and which began this morning. She states is worsened throughout the day. She's had several of or vomiting. Denies any fevers, chills, myalgias. She denies any vaginal or urinary symptoms. She describes the pain as achy,nonradiating, worse with bending, and better with palpation. She denies diarrhea or constipation.   Past Medical History  Diagnosis Date  . Polycystic ovarian syndrome 07/01/2011    Patient report  . Anxiety   . Depression   . Cancer of abdominal wall   . Rhabdosarcoma   . Hypertension   . Obesity   . Schizophrenia   . Bipolar 1 disorder   . Obesity    Past Surgical History  Procedure Laterality Date  . Cholecystectomy    . Varicose vein surgery    . Ovarian cyst excision     Family History  Problem Relation Age of Onset  . Coronary artery disease Maternal Grandmother   . Diabetes type II Maternal Grandmother   . Cancer Maternal Grandmother   . Hypertension Mother   . Hypertension Father    History  Substance Use Topics  . Smoking status: Never Smoker   . Smokeless tobacco: Never Used  . Alcohol Use: No   OB History   Grav Para Term Preterm Abortions TAB SAB Ect Mult Living   0              Review of  Systems  Ten systems reviewed and are negative for acute change, except as noted in the HPI.    Allergies  Geodon; Haldol; Compazine; Morphine and related; and Toradol  Home Medications   Prior to Admission medications   Medication Sig Start Date End Date Taking? Authorizing Provider  busPIRone (BUSPAR) 15 MG tablet Take 15 mg by mouth 3 (three) times daily.   Yes Historical Provider, MD  gabapentin (NEURONTIN) 400 MG capsule Take 400 mg by mouth 3 (three) times daily.   Yes Historical Provider, MD  hydrOXYzine (VISTARIL) 25 MG capsule Take 50 mg by mouth 3 (three) times daily as needed for anxiety.    Yes Historical Provider, MD  Melatonin 3 MG TABS Take 3 mg by mouth daily as needed (for sleep).   Yes Historical Provider, MD  QUEtiapine (SEROQUEL XR) 400 MG 24 hr tablet Take 800 mg by mouth at bedtime.   Yes Historical Provider, MD  sertraline (ZOLOFT) 50 MG tablet Take 150 mg by mouth daily.   Yes Historical Provider, MD  traZODone (DESYREL) 100 MG tablet Take 100 mg by mouth at bedtime.   Yes Historical Provider, MD   BP 129/66  Pulse 88  Temp(Src) 98.2 F (36.8 C) (Oral)  Resp 19  SpO2 97%  LMP 08/24/2013 Physical Exam  Constitutional: She is oriented to person, place, and  time. She appears well-developed and well-nourished. No distress.  HENT:  Head: Normocephalic and atraumatic.  Eyes: Conjunctivae are normal. No scleral icterus.  Neck: Normal range of motion.  Cardiovascular: Normal rate, regular rhythm and normal heart sounds.  Exam reveals no gallop and no friction rub.   No murmur heard. Pulmonary/Chest: Effort normal and breath sounds normal. No respiratory distress.  Abdominal: Soft. Bowel sounds are normal. She exhibits no distension and no mass. There is tenderness (minimal tenderness R Upper and Lower quadrants). There is no guarding.  Neurological: She is alert and oriented to person, place, and time.  Skin: Skin is warm and dry. She is not diaphoretic.   Psychiatric: Her behavior is normal.    ED Course  Procedures (including critical care time) Labs Review Labs Reviewed  CBC WITH DIFFERENTIAL - Abnormal; Notable for the following:    Eosinophils Relative 6 (*)    All other components within normal limits  COMPREHENSIVE METABOLIC PANEL - Abnormal; Notable for the following:    Sodium 135 (*)    Glucose, Bld 114 (*)    Creatinine, Ser 0.46 (*)    AST 67 (*)    ALT 38 (*)    Total Bilirubin 0.2 (*)    All other components within normal limits  URINALYSIS, ROUTINE W REFLEX MICROSCOPIC - Abnormal; Notable for the following:    APPearance HAZY (*)    Leukocytes, UA MODERATE (*)    All other components within normal limits  URINE MICROSCOPIC-ADD ON - Abnormal; Notable for the following:    Squamous Epithelial / LPF FEW (*)    Bacteria, UA FEW (*)    All other components within normal limits  LIPASE, BLOOD  POC URINE PREG, ED    Imaging Review No results found.   EKG Interpretation None      MDM   Final diagnoses:  None   11:00PM BP 142/90  Pulse 82  Temp(Src) 98.2 F (36.8 C) (Oral)  Resp 18  SpO2 97%  LMP 08/24/2013 Patient with typical abdominal pain, hypertension. She saw her pcp, but did not discuss her hypertension. She has had chronic abdominal pain and been seen in the ED multiple times. She had a CT scan, abdominal US, and pelvic work up at her last visit, all of which were negative. She has had no episodes of vomiting in the ED.    12:27 AM Patient abdominal pain improved in the ED.  No episodes of vomiting here. She is Afebrile with a normal White count.  Considering her history of chronic abdominal pain, multiple ED visits for the same, No active vomiting, minimal tenderness on exam,and recent comprehensive workup in the ED, i do not feel she needs a repeat CT scan in the ED. I have discussed strong return precautions with the patient who repeats warning sxs and signs and agrees with plan of care. She  will also need close follow up with  Her pcp. Patient appears stable for discharge and may follow up with her pcp.  The patient was given dilaudid at 12:25 PM. Her sister was supposed to wait in the waiting room. The patient  States that her sister left and got drunk and cannot drive her home now. Patient lives in [place]. i have discussed with is Dr Wyvonnia Dusky who will discharge the patient at the appropriate time.  Margarita Mail, PA-C 09/10/13 1626

## 2013-09-09 NOTE — ED Notes (Signed)
Pt c/o RLQ pain with some N/V starting today

## 2013-09-09 NOTE — ED Provider Notes (Signed)
Medical screening examination/treatment/procedure(s) were conducted as a shared visit with non-physician practitioner(s) and myself.  I personally evaluated the patient during the encounter.   EKG Interpretation None      28 yo female with recurrent ED visits for abdominal pain and other pain complaints who presents today with RLQ pain.  Similar presentation about 2 weeks ago with essentially negative workup.  On exam, well appearing, nontoxic, not distressed, normal respiratory effort, normal perfusion, abdomen soft, mild tenderness on right lower and upper abdomen, no r/r/g.  Otherwise well appearing.  No fevers.  No leukocytosis.  I have a low suspicion for appendicitis, bowel obstruction, nephrolithiasis, ovarian torsion or other acute surgical abdominal pathology.  However, given strict return precautions.   Clinical Impression: 1. Right lower quadrant abdominal pain   2. Right upper quadrant abdominal pain       Houston Siren III, MD 09/10/13 [phone]

## 2013-09-10 MED ORDER — TRAMADOL HCL 50 MG PO TABS: 50.0000 mg | ORAL_TABLET | Freq: Four times a day (QID) | ORAL | Status: DC | PRN

## 2013-09-10 MED ORDER — ONDANSETRON HCL 4 MG PO TABS: 4.0000 mg | ORAL_TABLET | Freq: Three times a day (TID) | ORAL | Status: DC | PRN

## 2013-09-10 NOTE — Discharge Instructions (Signed)
Abdominal (belly) pain can be caused by many things. Your caregiver performed an examination and possibly ordered blood/urine tests and imaging (CT scan, x-rays, ultrasound). Many cases can be observed and treated at home after initial evaluation in the emergency department. Even though you are being discharged home, abdominal pain can be unpredictable. Therefore, you need a repeated exam if your pain does not resolve, returns, or worsens. Most patients with abdominal pain don't have to be admitted to the hospital or have surgery, but serious problems like appendicitis and gallbladder attacks can start out as nonspecific pain. Many abdominal conditions cannot be diagnosed in one visit, so follow-up evaluations are very important. °SEEK IMMEDIATE MEDICAL ATTENTION IF: °The pain does not go away or becomes severe.  °A temperature above 101 develops.  °Repeated vomiting occurs (multiple episodes).  °The pain becomes localized to portions of the abdomen. The right side could possibly be appendicitis. In an adult, the left lower portion of the abdomen could be colitis or diverticulitis.  °Blood is being passed in stools or vomit (bright red or black tarry stools).  °Return also if you develop chest pain, difficulty breathing, dizziness or fainting, or become confused, poorly responsive, or inconsolable (young children). ° ° °Abdominal Pain, Women °Abdominal (stomach, pelvic, or belly) pain can be caused by many things. It is important to tell your doctor: °· The location of the pain. °· Does it come and go or is it present all the time? °· Are there things that start the pain (eating certain foods, exercise)? °· Are there other symptoms associated with the pain (fever, nausea, vomiting, diarrhea)? °All of this is helpful to know when trying to find the cause of the pain. °CAUSES  °· Stomach: virus or bacteria infection, or ulcer. °· Intestine: appendicitis (inflamed appendix), regional ileitis (Crohn's disease),  ulcerative colitis (inflamed colon), irritable bowel syndrome, diverticulitis (inflamed diverticulum of the colon), or cancer of the stomach or intestine. °· Gallbladder disease or stones in the gallbladder. °· Kidney disease, kidney stones, or infection. °· Pancreas infection or cancer. °· Fibromyalgia (pain disorder). °· Diseases of the female organs: °¨ Uterus: fibroid (non-cancerous) tumors or infection. °¨ Fallopian tubes: infection or tubal pregnancy. °¨ Ovary: cysts or tumors. °¨ Pelvic adhesions (scar tissue). °¨ Endometriosis (uterus lining tissue growing in the pelvis and on the pelvic organs). °¨ Pelvic congestion syndrome (female organs filling up with blood just before the menstrual period). °¨ Pain with the menstrual period. °¨ Pain with ovulation (producing an egg). °¨ Pain with an IUD (intrauterine device, birth control) in the uterus. °¨ Cancer of the female organs. °· Functional pain (pain not caused by a disease, may improve without treatment). °· Psychological pain. °· Depression. °DIAGNOSIS  °Your doctor will decide the seriousness of your pain by doing an examination. °· Blood tests. °· X-rays. °· Ultrasound. °· CT scan (computed tomography, special type of X-ray). °· MRI (magnetic resonance imaging). °· Cultures, for infection. °· Barium enema (dye inserted in the large intestine, to better view it with X-rays). °· Colonoscopy (looking in intestine with a lighted tube). °· Laparoscopy (minor surgery, looking in abdomen with a lighted tube). °· Major abdominal exploratory surgery (looking in abdomen with a large incision). °TREATMENT  °The treatment will depend on the cause of the pain.  °· Many cases can be observed and treated at home. °· Over-the-counter medicines recommended by your caregiver. °· Prescription medicine. °· Antibiotics, for infection. °· Birth control pills, for painful periods or for ovulation pain. °·   Hormone treatment, for endometriosis. °· Nerve blocking  injections. °· Physical therapy. °· Antidepressants. °· Counseling with a psychologist or psychiatrist. °· Minor or major surgery. °HOME CARE INSTRUCTIONS  °· Do not take laxatives, unless directed by your caregiver. °· Take over-the-counter pain medicine only if ordered by your caregiver. Do not take aspirin because it can cause an upset stomach or bleeding. °· Try a clear liquid diet (broth or water) as ordered by your caregiver. Slowly move to a bland diet, as tolerated, if the pain is related to the stomach or intestine. °· Have a thermometer and take your temperature several times a day, and record it. °· Bed rest and sleep, if it helps the pain. °· Avoid sexual intercourse, if it causes pain. °· Avoid stressful situations. °· Keep your follow-up appointments and tests, as your caregiver orders. °· If the pain does not go away with medicine or surgery, you may try: °¨ Acupuncture. °¨ Relaxation exercises (yoga, meditation). °¨ Group therapy. °¨ Counseling. °SEEK MEDICAL CARE IF:  °· You notice certain foods cause stomach pain. °· Your home care treatment is not helping your pain. °· You need stronger pain medicine. °· You want your IUD removed. °· You feel faint or lightheaded. °· You develop nausea and vomiting. °· You develop a rash. °· You are having side effects or an allergy to your medicine. °SEEK IMMEDIATE MEDICAL CARE IF:  °· Your pain does not go away or gets worse. °· You have a fever. °· Your pain is felt only in portions of the abdomen. The right side could possibly be appendicitis. The left lower portion of the abdomen could be colitis or diverticulitis. °· You are passing blood in your stools (bright red or black tarry stools, with or without vomiting). °· You have blood in your urine. °· You develop chills, with or without a fever. °· You pass out. °MAKE SURE YOU:  °· Understand these instructions. °· Will watch your condition. °· Will get help right away if you are not doing well or get  worse. °Document Released: 11/06/2006 Document Revised: 05/26/2013 Document Reviewed: 11/26/2008 °[employer]® Patient Information ©2015 [employer]. This information is not intended to replace advice given to you by your health care provider. Make sure you discuss any questions you have with your health care provider. ° °

## 2013-09-10 NOTE — ED Notes (Signed)
PT received Dilaudid at [facility] and does not have a ride home.

## 2013-09-11 LAB — URINE CULTURE

## 2013-09-11 NOTE — ED Provider Notes (Signed)
Medical screening examination/treatment/procedure(s) were conducted as a shared visit with non-physician practitioner(s) and myself.  I personally evaluated the patient during the encounter.   EKG Interpretation None        Houston Siren III, MD 09/11/13 [phone]

## 2013-09-20 ENCOUNTER — Emergency Department (HOSPITAL_COMMUNITY)
Admission: EM | Admit: 2013-09-20 | Discharge: 2013-09-20 | Discharge status: Against Medical Advice | Payer: Medicaid Other | Attending: Emergency Medicine | Admitting: Emergency Medicine

## 2013-09-20 ENCOUNTER — Encounter (HOSPITAL_COMMUNITY): Payer: Self-pay | Admitting: Emergency Medicine

## 2013-09-20 DIAGNOSIS — I10 Essential (primary) hypertension: Secondary | ICD-10-CM | POA: Diagnosis not present

## 2013-09-20 DIAGNOSIS — R079 Chest pain, unspecified: Secondary | ICD-10-CM | POA: Diagnosis not present

## 2013-09-20 LAB — BASIC METABOLIC PANEL
ANION GAP: 16 — AB (ref 5–15)
BUN: 6 mg/dL (ref 6–23)
CO2: 19 mEq/L (ref 19–32)
Calcium: 8.7 mg/dL (ref 8.4–10.5)
Chloride: 100 mEq/L (ref 96–112)
Creatinine, Ser: 0.46 mg/dL — ABNORMAL LOW (ref 0.50–1.10)
GFR calc Af Amer: >90 mL/min (ref >90)
GLUCOSE: 180 mg/dL — AB (ref 70–99)
Potassium: 3.8 mEq/L (ref 3.7–5.3)
SODIUM: 135 meq/L — AB (ref 137–147)

## 2013-09-20 LAB — CBC
HCT: 36.5 % (ref 36.0–46.0)
Hemoglobin: 12.3 g/dL (ref 12.0–15.0)
MCH: 28.3 pg (ref 26.0–34.0)
MCHC: 33.7 g/dL (ref 30.0–36.0)
MCV: 83.9 fL (ref 78.0–100.0)
PLATELETS: 225 10*3/uL (ref 150–400)
RBC: 4.35 MIL/uL (ref 3.87–5.11)
RDW: 14.2 % (ref 11.5–15.5)
WBC: 7 10*3/uL (ref 4.0–10.5)

## 2013-09-20 LAB — I-STAT TROPONIN, ED: TROPONIN I, POC: 0 ng/mL (ref 0.00–0.08)

## 2013-09-20 NOTE — ED Notes (Signed)
Registration paged pt.  Pt's pager and BP cuff in chair in waiting room.

## 2013-09-20 NOTE — ED Notes (Signed)
Radiology to take pt to x-ray.  Pt still not returned.  Will sign out pt.

## 2013-09-20 NOTE — ED Notes (Signed)
Pt reports sudden onset non radiating centralized chest pressure while she was laying down with difficulty breathing. Pt breathing rapidly in triage.

## 2013-10-12 DIAGNOSIS — H5316 Psychophysical visual disturbances: Secondary | ICD-10-CM | POA: Diagnosis not present

## 2013-10-12 DIAGNOSIS — R443 Hallucinations, unspecified: Secondary | ICD-10-CM | POA: Diagnosis not present

## 2013-10-12 DIAGNOSIS — R45851 Suicidal ideations: Secondary | ICD-10-CM | POA: Diagnosis not present

## 2013-10-26 DIAGNOSIS — S50311A Abrasion of right elbow, initial encounter: Secondary | ICD-10-CM | POA: Diagnosis not present

## 2013-10-26 DIAGNOSIS — F209 Schizophrenia, unspecified: Secondary | ICD-10-CM | POA: Diagnosis not present

## 2013-10-26 DIAGNOSIS — E119 Type 2 diabetes mellitus without complications: Secondary | ICD-10-CM | POA: Diagnosis not present

## 2013-10-26 DIAGNOSIS — S79911A Unspecified injury of right hip, initial encounter: Secondary | ICD-10-CM | POA: Diagnosis not present

## 2013-10-26 DIAGNOSIS — Z79899 Other long term (current) drug therapy: Secondary | ICD-10-CM | POA: Diagnosis not present

## 2013-10-26 DIAGNOSIS — R400 Somnolence: Secondary | ICD-10-CM | POA: Diagnosis not present

## 2013-10-26 DIAGNOSIS — W010XXA Fall on same level from slipping, tripping and stumbling without subsequent striking against object, initial encounter: Secondary | ICD-10-CM | POA: Diagnosis not present

## 2013-10-26 DIAGNOSIS — I10 Essential (primary) hypertension: Secondary | ICD-10-CM | POA: Diagnosis not present

## 2013-10-26 DIAGNOSIS — J45909 Unspecified asthma, uncomplicated: Secondary | ICD-10-CM | POA: Diagnosis not present

## 2013-10-26 DIAGNOSIS — Y92231 Patient bathroom in hospital as the place of occurrence of the external cause: Secondary | ICD-10-CM | POA: Diagnosis not present

## 2013-10-26 DIAGNOSIS — E780 Pure hypercholesterolemia: Secondary | ICD-10-CM | POA: Diagnosis not present

## 2013-10-26 DIAGNOSIS — R079 Chest pain, unspecified: Secondary | ICD-10-CM | POA: Diagnosis not present

## 2013-10-26 DIAGNOSIS — S0990XA Unspecified injury of head, initial encounter: Secondary | ICD-10-CM | POA: Diagnosis not present

## 2013-10-26 DIAGNOSIS — M25551 Pain in right hip: Secondary | ICD-10-CM | POA: Diagnosis not present

## 2013-10-26 DIAGNOSIS — S090XXA Injury of blood vessels of head, not elsewhere classified, initial encounter: Secondary | ICD-10-CM | POA: Diagnosis not present

## 2013-10-27 DIAGNOSIS — R400 Somnolence: Secondary | ICD-10-CM | POA: Diagnosis not present

## 2013-10-27 DIAGNOSIS — S0990XA Unspecified injury of head, initial encounter: Secondary | ICD-10-CM | POA: Diagnosis not present

## 2013-10-27 DIAGNOSIS — S79911A Unspecified injury of right hip, initial encounter: Secondary | ICD-10-CM | POA: Diagnosis not present

## 2013-10-27 DIAGNOSIS — R079 Chest pain, unspecified: Secondary | ICD-10-CM | POA: Diagnosis not present

## 2013-10-27 DIAGNOSIS — M25551 Pain in right hip: Secondary | ICD-10-CM | POA: Diagnosis not present

## 2013-11-15 DIAGNOSIS — N72 Inflammatory disease of cervix uteri: Secondary | ICD-10-CM | POA: Diagnosis not present

## 2013-11-15 DIAGNOSIS — R1031 Right lower quadrant pain: Secondary | ICD-10-CM | POA: Diagnosis not present

## 2013-11-15 DIAGNOSIS — E780 Pure hypercholesterolemia: Secondary | ICD-10-CM | POA: Diagnosis not present

## 2013-11-15 DIAGNOSIS — I10 Essential (primary) hypertension: Secondary | ICD-10-CM | POA: Diagnosis not present

## 2013-11-15 DIAGNOSIS — K7689 Other specified diseases of liver: Secondary | ICD-10-CM | POA: Diagnosis not present

## 2013-11-15 DIAGNOSIS — Z888 Allergy status to other drugs, medicaments and biological substances status: Secondary | ICD-10-CM | POA: Diagnosis not present

## 2013-11-15 DIAGNOSIS — J45909 Unspecified asthma, uncomplicated: Secondary | ICD-10-CM | POA: Diagnosis not present

## 2013-11-15 DIAGNOSIS — E119 Type 2 diabetes mellitus without complications: Secondary | ICD-10-CM | POA: Diagnosis not present

## 2013-11-17 DIAGNOSIS — R296 Repeated falls: Secondary | ICD-10-CM | POA: Diagnosis not present

## 2013-11-17 DIAGNOSIS — Z23 Encounter for immunization: Secondary | ICD-10-CM | POA: Diagnosis not present

## 2013-11-20 DIAGNOSIS — Z79899 Other long term (current) drug therapy: Secondary | ICD-10-CM | POA: Diagnosis not present

## 2013-11-20 DIAGNOSIS — M79606 Pain in leg, unspecified: Secondary | ICD-10-CM | POA: Diagnosis not present

## 2013-11-26 DIAGNOSIS — K429 Umbilical hernia without obstruction or gangrene: Secondary | ICD-10-CM | POA: Diagnosis not present

## 2013-11-26 DIAGNOSIS — M542 Cervicalgia: Secondary | ICD-10-CM | POA: Diagnosis not present

## 2013-11-26 DIAGNOSIS — R945 Abnormal results of liver function studies: Secondary | ICD-10-CM | POA: Diagnosis not present

## 2013-11-26 DIAGNOSIS — J90 Pleural effusion, not elsewhere classified: Secondary | ICD-10-CM | POA: Diagnosis not present

## 2013-11-26 DIAGNOSIS — K760 Fatty (change of) liver, not elsewhere classified: Secondary | ICD-10-CM | POA: Diagnosis not present

## 2013-11-26 DIAGNOSIS — J9811 Atelectasis: Secondary | ICD-10-CM | POA: Diagnosis not present

## 2013-11-26 DIAGNOSIS — R0789 Other chest pain: Secondary | ICD-10-CM | POA: Diagnosis not present

## 2013-11-26 DIAGNOSIS — R079 Chest pain, unspecified: Secondary | ICD-10-CM | POA: Diagnosis not present

## 2013-11-26 DIAGNOSIS — R1084 Generalized abdominal pain: Secondary | ICD-10-CM | POA: Diagnosis not present

## 2013-11-26 DIAGNOSIS — R1031 Right lower quadrant pain: Secondary | ICD-10-CM | POA: Diagnosis not present

## 2013-11-26 DIAGNOSIS — R42 Dizziness and giddiness: Secondary | ICD-10-CM | POA: Diagnosis not present

## 2013-11-26 DIAGNOSIS — R112 Nausea with vomiting, unspecified: Secondary | ICD-10-CM | POA: Diagnosis not present

## 2013-11-27 DIAGNOSIS — K429 Umbilical hernia without obstruction or gangrene: Secondary | ICD-10-CM | POA: Diagnosis not present

## 2013-11-27 DIAGNOSIS — J90 Pleural effusion, not elsewhere classified: Secondary | ICD-10-CM | POA: Diagnosis not present

## 2013-11-27 DIAGNOSIS — J9811 Atelectasis: Secondary | ICD-10-CM | POA: Diagnosis not present

## 2013-11-27 DIAGNOSIS — K760 Fatty (change of) liver, not elsewhere classified: Secondary | ICD-10-CM | POA: Diagnosis not present

## 2013-12-01 DIAGNOSIS — E119 Type 2 diabetes mellitus without complications: Secondary | ICD-10-CM | POA: Diagnosis not present

## 2013-12-01 DIAGNOSIS — I10 Essential (primary) hypertension: Secondary | ICD-10-CM | POA: Diagnosis not present

## 2013-12-01 DIAGNOSIS — J45909 Unspecified asthma, uncomplicated: Secondary | ICD-10-CM | POA: Diagnosis not present

## 2013-12-01 DIAGNOSIS — E780 Pure hypercholesterolemia: Secondary | ICD-10-CM | POA: Diagnosis not present

## 2013-12-01 DIAGNOSIS — R079 Chest pain, unspecified: Secondary | ICD-10-CM | POA: Diagnosis not present

## 2013-12-08 DIAGNOSIS — K760 Fatty (change of) liver, not elsewhere classified: Secondary | ICD-10-CM | POA: Diagnosis not present

## 2013-12-08 DIAGNOSIS — R000 Tachycardia, unspecified: Secondary | ICD-10-CM | POA: Diagnosis not present

## 2013-12-08 DIAGNOSIS — K219 Gastro-esophageal reflux disease without esophagitis: Secondary | ICD-10-CM | POA: Diagnosis not present

## 2013-12-08 DIAGNOSIS — E1165 Type 2 diabetes mellitus with hyperglycemia: Secondary | ICD-10-CM | POA: Diagnosis not present

## 2013-12-11 DIAGNOSIS — Z792 Long term (current) use of antibiotics: Secondary | ICD-10-CM | POA: Diagnosis not present

## 2013-12-11 DIAGNOSIS — E780 Pure hypercholesterolemia: Secondary | ICD-10-CM | POA: Diagnosis not present

## 2013-12-11 DIAGNOSIS — G609 Hereditary and idiopathic neuropathy, unspecified: Secondary | ICD-10-CM | POA: Diagnosis not present

## 2013-12-11 DIAGNOSIS — R1031 Right lower quadrant pain: Secondary | ICD-10-CM | POA: Diagnosis not present

## 2013-12-11 DIAGNOSIS — N390 Urinary tract infection, site not specified: Secondary | ICD-10-CM | POA: Diagnosis not present

## 2013-12-11 DIAGNOSIS — I10 Essential (primary) hypertension: Secondary | ICD-10-CM | POA: Diagnosis not present

## 2013-12-11 DIAGNOSIS — M4806 Spinal stenosis, lumbar region: Secondary | ICD-10-CM | POA: Diagnosis not present

## 2013-12-11 DIAGNOSIS — N8320 Unspecified ovarian cysts: Secondary | ICD-10-CM | POA: Diagnosis not present

## 2013-12-11 DIAGNOSIS — J45909 Unspecified asthma, uncomplicated: Secondary | ICD-10-CM | POA: Diagnosis not present

## 2013-12-11 DIAGNOSIS — M79606 Pain in leg, unspecified: Secondary | ICD-10-CM | POA: Diagnosis not present

## 2013-12-11 DIAGNOSIS — G729 Myopathy, unspecified: Secondary | ICD-10-CM | POA: Diagnosis not present

## 2013-12-11 DIAGNOSIS — N8329 Other ovarian cysts: Secondary | ICD-10-CM | POA: Diagnosis not present

## 2013-12-19 DIAGNOSIS — I10 Essential (primary) hypertension: Secondary | ICD-10-CM | POA: Diagnosis not present

## 2013-12-19 DIAGNOSIS — E119 Type 2 diabetes mellitus without complications: Secondary | ICD-10-CM | POA: Diagnosis not present

## 2013-12-19 DIAGNOSIS — R32 Unspecified urinary incontinence: Secondary | ICD-10-CM | POA: Diagnosis not present

## 2013-12-19 DIAGNOSIS — Z9181 History of falling: Secondary | ICD-10-CM | POA: Diagnosis not present

## 2013-12-19 DIAGNOSIS — M79605 Pain in left leg: Secondary | ICD-10-CM | POA: Diagnosis not present

## 2013-12-19 DIAGNOSIS — Z87440 Personal history of urinary (tract) infections: Secondary | ICD-10-CM | POA: Diagnosis not present

## 2013-12-19 DIAGNOSIS — M79604 Pain in right leg: Secondary | ICD-10-CM | POA: Diagnosis not present

## 2013-12-19 DIAGNOSIS — F259 Schizoaffective disorder, unspecified: Secondary | ICD-10-CM | POA: Diagnosis not present

## 2013-12-19 DIAGNOSIS — F319 Bipolar disorder, unspecified: Secondary | ICD-10-CM | POA: Diagnosis not present

## 2013-12-22 DIAGNOSIS — M79605 Pain in left leg: Secondary | ICD-10-CM | POA: Diagnosis not present

## 2013-12-22 DIAGNOSIS — M79662 Pain in left lower leg: Secondary | ICD-10-CM | POA: Diagnosis not present

## 2013-12-22 DIAGNOSIS — Z792 Long term (current) use of antibiotics: Secondary | ICD-10-CM | POA: Diagnosis not present

## 2013-12-22 DIAGNOSIS — M79661 Pain in right lower leg: Secondary | ICD-10-CM | POA: Diagnosis not present

## 2013-12-22 DIAGNOSIS — M79604 Pain in right leg: Secondary | ICD-10-CM | POA: Diagnosis not present

## 2013-12-22 DIAGNOSIS — I10 Essential (primary) hypertension: Secondary | ICD-10-CM | POA: Diagnosis not present

## 2013-12-23 DIAGNOSIS — R55 Syncope and collapse: Secondary | ICD-10-CM | POA: Diagnosis not present

## 2013-12-23 DIAGNOSIS — M5126 Other intervertebral disc displacement, lumbar region: Secondary | ICD-10-CM | POA: Diagnosis not present

## 2013-12-23 DIAGNOSIS — M47896 Other spondylosis, lumbar region: Secondary | ICD-10-CM | POA: Diagnosis not present

## 2013-12-23 DIAGNOSIS — M4806 Spinal stenosis, lumbar region: Secondary | ICD-10-CM | POA: Diagnosis not present

## 2013-12-24 DIAGNOSIS — E119 Type 2 diabetes mellitus without complications: Secondary | ICD-10-CM | POA: Diagnosis not present

## 2013-12-24 DIAGNOSIS — F319 Bipolar disorder, unspecified: Secondary | ICD-10-CM | POA: Diagnosis not present

## 2013-12-24 DIAGNOSIS — M79605 Pain in left leg: Secondary | ICD-10-CM | POA: Diagnosis not present

## 2013-12-24 DIAGNOSIS — F259 Schizoaffective disorder, unspecified: Secondary | ICD-10-CM | POA: Diagnosis not present

## 2013-12-24 DIAGNOSIS — I10 Essential (primary) hypertension: Secondary | ICD-10-CM | POA: Diagnosis not present

## 2013-12-24 DIAGNOSIS — M79604 Pain in right leg: Secondary | ICD-10-CM | POA: Diagnosis not present

## 2013-12-27 DIAGNOSIS — I10 Essential (primary) hypertension: Secondary | ICD-10-CM | POA: Diagnosis not present

## 2013-12-27 DIAGNOSIS — F319 Bipolar disorder, unspecified: Secondary | ICD-10-CM | POA: Diagnosis not present

## 2013-12-27 DIAGNOSIS — F259 Schizoaffective disorder, unspecified: Secondary | ICD-10-CM | POA: Diagnosis not present

## 2013-12-27 DIAGNOSIS — E119 Type 2 diabetes mellitus without complications: Secondary | ICD-10-CM | POA: Diagnosis not present

## 2013-12-27 DIAGNOSIS — M79604 Pain in right leg: Secondary | ICD-10-CM | POA: Diagnosis not present

## 2013-12-27 DIAGNOSIS — M79605 Pain in left leg: Secondary | ICD-10-CM | POA: Diagnosis not present

## 2013-12-28 ENCOUNTER — Emergency Department (HOSPITAL_COMMUNITY)
Admission: EM | Admit: 2013-12-28 | Discharge: 2013-12-28 | Disposition: A | Discharge status: Home / Self Care | Payer: Medicare Other | Attending: Emergency Medicine | Admitting: Emergency Medicine

## 2013-12-28 ENCOUNTER — Encounter (HOSPITAL_COMMUNITY): Payer: Self-pay | Admitting: *Deleted

## 2013-12-28 DIAGNOSIS — F319 Bipolar disorder, unspecified: Secondary | ICD-10-CM | POA: Insufficient documentation

## 2013-12-28 DIAGNOSIS — F419 Anxiety disorder, unspecified: Secondary | ICD-10-CM | POA: Insufficient documentation

## 2013-12-28 DIAGNOSIS — I10 Essential (primary) hypertension: Secondary | ICD-10-CM | POA: Diagnosis not present

## 2013-12-28 DIAGNOSIS — Z3202 Encounter for pregnancy test, result negative: Secondary | ICD-10-CM | POA: Insufficient documentation

## 2013-12-28 DIAGNOSIS — E669 Obesity, unspecified: Secondary | ICD-10-CM | POA: Diagnosis not present

## 2013-12-28 DIAGNOSIS — F209 Schizophrenia, unspecified: Secondary | ICD-10-CM | POA: Diagnosis not present

## 2013-12-28 DIAGNOSIS — Z79899 Other long term (current) drug therapy: Secondary | ICD-10-CM | POA: Diagnosis not present

## 2013-12-28 DIAGNOSIS — R1031 Right lower quadrant pain: Secondary | ICD-10-CM | POA: Diagnosis present

## 2013-12-28 DIAGNOSIS — N390 Urinary tract infection, site not specified: Secondary | ICD-10-CM | POA: Insufficient documentation

## 2013-12-28 DIAGNOSIS — R112 Nausea with vomiting, unspecified: Secondary | ICD-10-CM | POA: Diagnosis not present

## 2013-12-28 DIAGNOSIS — B9689 Other specified bacterial agents as the cause of diseases classified elsewhere: Secondary | ICD-10-CM | POA: Diagnosis not present

## 2013-12-28 LAB — CBC WITH DIFFERENTIAL/PLATELET
BASOS PCT: 0 % (ref 0–1)
Basophils Absolute: 0 10*3/uL (ref 0.0–0.1)
Eosinophils Absolute: 0.3 10*3/uL (ref 0.0–0.7)
Eosinophils Relative: 6 % — ABNORMAL HIGH (ref 0–5)
HCT: 36 % (ref 36.0–46.0)
HEMOGLOBIN: 12 g/dL (ref 12.0–15.0)
LYMPHS PCT: 27 % (ref 12–46)
Lymphs Abs: 1.3 10*3/uL (ref 0.7–4.0)
MCH: 28.4 pg (ref 26.0–34.0)
MCHC: 33.3 g/dL (ref 30.0–36.0)
MCV: 85.1 fL (ref 78.0–100.0)
MONOS PCT: 4 % (ref 3–12)
Monocytes Absolute: 0.2 10*3/uL (ref 0.1–1.0)
NEUTROS ABS: 2.9 10*3/uL (ref 1.7–7.7)
NEUTROS PCT: 63 % (ref 43–77)
Platelets: 157 10*3/uL (ref 150–400)
RBC: 4.23 MIL/uL (ref 3.87–5.11)
RDW: 14.7 % (ref 11.5–15.5)
WBC: 4.6 10*3/uL (ref 4.0–10.5)

## 2013-12-28 LAB — URINALYSIS, ROUTINE W REFLEX MICROSCOPIC
Bilirubin Urine: NEGATIVE
Glucose, UA: 250 mg/dL — AB
Hgb urine dipstick: NEGATIVE
Ketones, ur: NEGATIVE mg/dL
Nitrite: NEGATIVE
PH: 5 (ref 5.0–8.0)
Protein, ur: NEGATIVE mg/dL
Specific Gravity, Urine: 1.025 (ref 1.005–1.030)
Urobilinogen, UA: 0.2 mg/dL (ref 0.0–1.0)

## 2013-12-28 LAB — COMPREHENSIVE METABOLIC PANEL
ALBUMIN: 3.6 g/dL (ref 3.5–5.2)
ALK PHOS: 99 U/L (ref 39–117)
ALT: 47 U/L — ABNORMAL HIGH (ref 0–35)
AST: 70 U/L — ABNORMAL HIGH (ref 0–37)
Anion gap: 17 — ABNORMAL HIGH (ref 5–15)
BUN: 7 mg/dL (ref 6–23)
CHLORIDE: 99 meq/L (ref 96–112)
CO2: 21 meq/L (ref 19–32)
Calcium: 8.8 mg/dL (ref 8.4–10.5)
Creatinine, Ser: 0.43 mg/dL — ABNORMAL LOW (ref 0.50–1.10)
GFR calc Af Amer: >90 mL/min (ref >90)
GFR calc non Af Amer: >90 mL/min (ref >90)
Glucose, Bld: 279 mg/dL — ABNORMAL HIGH (ref 70–99)
POTASSIUM: 3.9 meq/L (ref 3.7–5.3)
Sodium: 137 mEq/L (ref 137–147)
Total Bilirubin: 0.3 mg/dL (ref 0.3–1.2)
Total Protein: 7.2 g/dL (ref 6.0–8.3)

## 2013-12-28 LAB — URINE MICROSCOPIC-ADD ON

## 2013-12-28 LAB — POC URINE PREG, ED: Preg Test, Ur: NEGATIVE

## 2013-12-28 MED ORDER — STERILE WATER FOR INJECTION IJ SOLN
INTRAMUSCULAR | Status: AC
  Administered 2013-12-28: 2 mL
  Filled 2013-12-28: qty 10

## 2013-12-28 MED ORDER — OXYCODONE-ACETAMINOPHEN 5-325 MG PO TABS
2.0000 | ORAL_TABLET | Freq: Once | ORAL | Status: AC
  Administered 2013-12-28: 2 via ORAL
  Filled 2013-12-28: qty 2

## 2013-12-28 MED ORDER — ONDANSETRON 4 MG PO TBDP
4.0000 mg | ORAL_TABLET | Freq: Once | ORAL | Status: AC
  Administered 2013-12-28: 4 mg via ORAL
  Filled 2013-12-28: qty 1

## 2013-12-28 MED ORDER — TRAMADOL HCL 50 MG PO TABS: 50.0000 mg | ORAL_TABLET | Freq: Four times a day (QID) | ORAL | Status: DC | PRN

## 2013-12-28 MED ORDER — ONDANSETRON HCL 4 MG PO TABS: 4.0000 mg | ORAL_TABLET | Freq: Three times a day (TID) | ORAL | Status: DC | PRN

## 2013-12-28 MED ORDER — CEPHALEXIN 500 MG PO CAPS: 500.0000 mg | ORAL_CAPSULE | Freq: Three times a day (TID) | ORAL | Status: DC

## 2013-12-28 MED ORDER — LIDOCAINE HCL (PF) 1 % IJ SOLN: 30.0000 mL | Freq: Once | INTRAMUSCULAR | Status: DC

## 2013-12-28 MED ORDER — PHENAZOPYRIDINE HCL 200 MG PO TABS: 200.0000 mg | ORAL_TABLET | Freq: Three times a day (TID) | ORAL | Status: DC | PRN

## 2013-12-28 MED ORDER — CEFTRIAXONE SODIUM 1 G IJ SOLR
1.0000 g | Freq: Once | INTRAMUSCULAR | Status: AC
  Administered 2013-12-28: 1 g via INTRAMUSCULAR
  Filled 2013-12-28: qty 10

## 2013-12-28 NOTE — ED Notes (Signed)
Pt reports RLQ pain since last night and nausea. Denies diarrhea, urinary or vaginal symptoms.

## 2013-12-28 NOTE — ED Notes (Signed)
PA at bedside.

## 2013-12-28 NOTE — Discharge Instructions (Signed)
Urinary Tract Infection Urinary tract infections (UTIs) can develop anywhere along your urinary tract. Your urinary tract is your body's drainage system for removing wastes and extra water. Your urinary tract includes two kidneys, two ureters, a bladder, and a urethra. Your kidneys are a pair of bean-shaped organs. Each kidney is about the size of your fist. They are located below your ribs, one on each side of your spine. CAUSES Infections are caused by microbes, which are microscopic organisms, including fungi, viruses, and bacteria. These organisms are so small that they can only be seen through a microscope. Bacteria are the microbes that most commonly cause UTIs. SYMPTOMS  Symptoms of UTIs may vary by age and gender of the patient and by the location of the infection. Symptoms in young women typically include a frequent and intense urge to urinate and a painful, burning feeling in the bladder or urethra during urination. Older women and men are more likely to be tired, shaky, and weak and have muscle aches and abdominal pain. A fever may mean the infection is in your kidneys. Other symptoms of a kidney infection include pain in your back or sides below the ribs, nausea, and vomiting. DIAGNOSIS To diagnose a UTI, your caregiver will ask you about your symptoms. Your caregiver also will ask to provide a urine sample. The urine sample will be tested for bacteria and white blood cells. White blood cells are made by your body to help fight infection. TREATMENT  Typically, UTIs can be treated with medication. Because most UTIs are caused by a bacterial infection, they usually can be treated with the use of antibiotics. The choice of antibiotic and length of treatment depend on your symptoms and the type of bacteria causing your infection. HOME CARE INSTRUCTIONS  If you were prescribed antibiotics, take them exactly as your caregiver instructs you. Finish the medication even if you feel better after you  have only taken some of the medication.  Drink enough water and fluids to keep your urine clear or pale yellow.  Avoid caffeine, tea, and carbonated beverages. They tend to irritate your bladder.  Empty your bladder often. Avoid holding urine for long periods of time.  Empty your bladder before and after sexual intercourse.  After a bowel movement, women should cleanse from front to back. Use each tissue only once. SEEK MEDICAL CARE IF:   You have back pain.  You develop a fever.  Your symptoms do not begin to resolve within 3 days. SEEK IMMEDIATE MEDICAL CARE IF:   You have severe back pain or lower abdominal pain.  You develop chills.  You have nausea or vomiting.  You have continued burning or discomfort with urination. MAKE SURE YOU:   Understand these instructions.  Will watch your condition.  Will get help right away if you are not doing well or get worse. Document Released: 10/19/2004 Document Revised: 07/11/2011 Document Reviewed: 02/17/2011 [facility] Patient Information 2015 [place], [place]. This information is not intended to replace advice given to you by your health care provider. Make sure you discuss any questions you have with your health care provider.   SEEK IMMEDIATE MEDICAL ATTENTION IF: The pain becomes severe.  A temperature above 101 develops.  Unable to hold down medications or fluid. The pain becomes localized to portions of the abdomen.  Return also if you develop chest pain, difficulty breathing, dizziness or fainting, or become confused, poorly responsive, or inconsolable (young children).

## 2013-12-28 NOTE — ED Provider Notes (Signed)
CSN: [id number]     Arrival date & time 12/28/13  1217 History   First MD Initiated Contact with Patient 12/28/13 1339     Chief Complaint  Patient presents with  . Abdominal Pain     (Consider location/radiation/quality/duration/timing/severity/associated sxs/prior Treatment) Patient is a 28 y.o. female presenting with abdominal pain. The history is provided by the patient. No language interpreter was used.  Abdominal Pain Pain location:  RLQ Pain quality: aching   Pain radiates to:  Does not radiate Pain severity:  Moderate Onset quality: acute on chronic. Timing:  Constant Progression:  Waxing and waning Chronicity:  Chronic Context: not recent illness, not recent sexual activity, not sick contacts and not trauma   Associated symptoms: nausea and vomiting    This is a 28 y/o F with a pmh of schizophrenia,PCOS, NASH and RLQ abdominal pain with whom I am very familiar who presents with RLQ abdominal pain. The patient c/o worsening pain in The RLQ which she states is colicky, severe, nonradiating. She has associated nausea and vomiting about 3 episodes which were NBNB. She denies diarrhea. The patient states that she has never been sexually active. LMP  12/19/2013. Patient states: "when you press down it hurts, but it hurts worse on the right when you lift up." The patient has had more than 17 various images of the abdomen and pelvis since 2008, 5 of which are CTs  (last 07/2013) and none of which have shown acute pathology. 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  Past Medical History  Diagnosis Date  . Polycystic ovarian syndrome 07/01/2011    Patient report  . Anxiety   . Depression   . Cancer of abdominal wall   . Rhabdosarcoma   . Hypertension   . Obesity   . Schizophrenia   . Bipolar 1 disorder   . Obesity    Past Surgical History  Procedure Laterality Date  . Cholecystectomy    . Varicose vein surgery    . Ovarian  cyst excision     Family History  Problem Relation Age of Onset  . Coronary artery disease Maternal Grandmother   . Diabetes type II Maternal Grandmother   . Cancer Maternal Grandmother   . Hypertension Mother   . Hypertension Father    History  Substance Use Topics  . Smoking status: Never Smoker   . Smokeless tobacco: Never Used  . Alcohol Use: No   OB History    Gravida Para Term Preterm AB TAB SAB Ectopic Multiple Living   0              Review of Systems  Constitutional: Negative.   HENT: Negative.   Eyes: Negative.   Respiratory: Negative.   Gastrointestinal: Positive for nausea, vomiting and abdominal pain.  Genitourinary: Negative.   Neurological: Negative.   All other systems reviewed and are negative.     Allergies  Geodon; Haldol; Compazine; Morphine and related; and Toradol  Home Medications   Prior to Admission medications   Medication Sig Start Date End Date Taking? Authorizing Provider  busPIRone (BUSPAR) 15 MG tablet Take 15 mg by mouth 3 (three) times daily.    Historical Provider, MD  gabapentin (NEURONTIN) 400 MG capsule Take 400 mg by mouth 3 (three) times daily.    Historical Provider, MD  hydrOXYzine (VISTARIL) 25 MG capsule Take 50 mg by mouth 3 (three) times daily as needed for anxiety.     Historical  Provider, MD  Melatonin 3 MG TABS Take 3 mg by mouth daily as needed (for sleep).    Historical Provider, MD  ondansetron (ZOFRAN) 4 MG tablet Take 1 tablet (4 mg total) by mouth every 8 (eight) hours as needed for nausea or vomiting. 09/10/13   Margarita Mail, PA-C  QUEtiapine (SEROQUEL XR) 400 MG 24 hr tablet Take 800 mg by mouth at bedtime.    Historical Provider, MD  sertraline (ZOLOFT) 50 MG tablet Take 150 mg by mouth daily.    Historical Provider, MD  traMADol (ULTRAM) 50 MG tablet Take 1 tablet (50 mg total) by mouth every 6 (six) hours as needed. 09/10/13   Margarita Mail, PA-C  traZODone (DESYREL) 100 MG tablet Take 100 mg by mouth at  bedtime.    Historical Provider, MD   BP 138/56 mmHg  Pulse 104  Temp(Src) 98.2 F (36.8 C) (Oral)  Resp 20  SpO2 97%  LMP 12/14/2013 Physical Exam  Constitutional: She is oriented to person, place, and time. She appears well-developed and well-nourished. No distress.  HENT:  Head: Normocephalic and atraumatic.  Eyes: Conjunctivae are normal. No scleral icterus.  Neck: Normal range of motion.  Cardiovascular: Normal rate, regular rhythm and normal heart sounds.  Exam reveals no gallop and no friction rub.   No murmur heard. Pulmonary/Chest: Effort normal and breath sounds normal. No respiratory distress.  Abdominal: Soft. Bowel sounds are normal. She exhibits no distension and no mass. There is tenderness in the right lower quadrant. There is no rigidity, no rebound, no guarding, no CVA tenderness and no tenderness at McBurney's point.  Patient with diffuse abdominal pain >in RLQ.  Reproted Rebound is not reproducible.  -Rovsing's/psoas/obturator No CVA tenderness.   Neurological: She is alert and oriented to person, place, and time.  Skin: Skin is warm and dry. She is not diaphoretic.  Nursing note and vitals reviewed.   ED Course  Procedures (including critical care time) Labs Review Labs Reviewed  CBC WITH DIFFERENTIAL - Abnormal; Notable for the following:    Eosinophils Relative 6 (*)    All other components within normal limits  COMPREHENSIVE METABOLIC PANEL - Abnormal; Notable for the following:    Glucose, Bld 279 (*)    Creatinine, Ser 0.43 (*)    AST 70 (*)    ALT 47 (*)    Anion gap 17 (*)    All other components within normal limits  URINALYSIS, ROUTINE W REFLEX MICROSCOPIC - Abnormal; Notable for the following:    APPearance CLOUDY (*)    Glucose, UA 250 (*)    Leukocytes, UA MODERATE (*)    All other components within normal limits  URINE MICROSCOPIC-ADD ON - Abnormal; Notable for the following:    Squamous Epithelial / LPF MANY (*)    Bacteria, UA MANY  (*)    All other components within normal limits  POC URINE PREG, ED    Imaging Review No results found.   EKG Interpretation None      MDM   Final diagnoses:  UTI (lower urinary tract infection)   Filed Vitals:   12/28/13 1220 12/28/13 1400 12/28/13 1524 12/28/13 1527  BP: 138/56 133/68 133/80   Pulse: 104 96 91   Temp: 98.2 F (36.8 C)   97.8 F (36.6 C)  TempSrc: Oral   Oral  Resp: 20  20   SpO2: 97% 97% 96%     Patient with RLQ  And diffuse abdominal pain.  + for UTI. No  CVA tenderness.  She is afebrile, non toxic, non-septic appearing. She has no Leukocytosis. Negative pregnancy LFT abnormalaities are chronic although a bit heigher today and are likely due to her hepatosteatosis I doubt that the patient's pain represents appendicitis, ovarian torsion. I am familiar with this patient and she appears well. No active vomiting. Her presentation today is a common complaint for this patient. The patient has a  UTI and has reason to feel poorly. I do not feel that further imaging, given her chronic issues and multiple images is warranted at this time. I feel it will be fair for the patient to be discharged with strong warning precautions to return for worsening sxs and try a period of observation at home.  She has had no repeat episodes of vomiting here in the ED over the course of her visit. Patient treated with percocet, zofran, and ROcephin. D/c with keflex and tramadol. Patient is advised to return to the ED  If sxs become worse and strict instructions given. Pt feels improved after observation and/or treatment in ED. The patient appears reasonably screened and/or stabilized for discharge and I doubt any other medical condition or other [facility] requiring further screening, evaluation, or treatment in the ED at this time prior to discharge.     I discussed the case with my attending physician Dr. Eulis Foster who agrees with [place], PA-C 12/29/13 [place], MD 12/30/13 [phone]

## 2013-12-30 DIAGNOSIS — M79605 Pain in left leg: Secondary | ICD-10-CM | POA: Diagnosis not present

## 2013-12-30 DIAGNOSIS — I10 Essential (primary) hypertension: Secondary | ICD-10-CM | POA: Diagnosis not present

## 2013-12-30 DIAGNOSIS — F319 Bipolar disorder, unspecified: Secondary | ICD-10-CM | POA: Diagnosis not present

## 2013-12-30 DIAGNOSIS — F259 Schizoaffective disorder, unspecified: Secondary | ICD-10-CM | POA: Diagnosis not present

## 2013-12-30 DIAGNOSIS — E119 Type 2 diabetes mellitus without complications: Secondary | ICD-10-CM | POA: Diagnosis not present

## 2013-12-30 DIAGNOSIS — M79604 Pain in right leg: Secondary | ICD-10-CM | POA: Diagnosis not present

## 2014-01-01 DIAGNOSIS — R161 Splenomegaly, not elsewhere classified: Secondary | ICD-10-CM | POA: Diagnosis not present

## 2014-01-01 DIAGNOSIS — E1165 Type 2 diabetes mellitus with hyperglycemia: Secondary | ICD-10-CM | POA: Diagnosis not present

## 2014-01-01 DIAGNOSIS — K760 Fatty (change of) liver, not elsewhere classified: Secondary | ICD-10-CM | POA: Diagnosis not present

## 2014-01-01 DIAGNOSIS — E119 Type 2 diabetes mellitus without complications: Secondary | ICD-10-CM | POA: Diagnosis not present

## 2014-01-01 DIAGNOSIS — R1031 Right lower quadrant pain: Secondary | ICD-10-CM | POA: Diagnosis not present

## 2014-01-01 DIAGNOSIS — I10 Essential (primary) hypertension: Secondary | ICD-10-CM | POA: Diagnosis not present

## 2014-01-01 DIAGNOSIS — R109 Unspecified abdominal pain: Secondary | ICD-10-CM | POA: Diagnosis not present

## 2014-01-01 DIAGNOSIS — M4806 Spinal stenosis, lumbar region: Secondary | ICD-10-CM | POA: Diagnosis not present

## 2014-01-01 DIAGNOSIS — R739 Hyperglycemia, unspecified: Secondary | ICD-10-CM | POA: Diagnosis not present

## 2014-01-02 DIAGNOSIS — E119 Type 2 diabetes mellitus without complications: Secondary | ICD-10-CM | POA: Diagnosis not present

## 2014-01-02 DIAGNOSIS — I10 Essential (primary) hypertension: Secondary | ICD-10-CM | POA: Diagnosis not present

## 2014-01-02 DIAGNOSIS — M79604 Pain in right leg: Secondary | ICD-10-CM | POA: Diagnosis not present

## 2014-01-02 DIAGNOSIS — F319 Bipolar disorder, unspecified: Secondary | ICD-10-CM | POA: Diagnosis not present

## 2014-01-02 DIAGNOSIS — M79605 Pain in left leg: Secondary | ICD-10-CM | POA: Diagnosis not present

## 2014-01-02 DIAGNOSIS — F259 Schizoaffective disorder, unspecified: Secondary | ICD-10-CM | POA: Diagnosis not present

## 2014-01-04 DIAGNOSIS — R1033 Periumbilical pain: Secondary | ICD-10-CM | POA: Diagnosis not present

## 2014-01-04 DIAGNOSIS — R162 Hepatomegaly with splenomegaly, not elsewhere classified: Secondary | ICD-10-CM | POA: Diagnosis not present

## 2014-01-04 DIAGNOSIS — R110 Nausea: Secondary | ICD-10-CM | POA: Diagnosis not present

## 2014-01-04 DIAGNOSIS — I517 Cardiomegaly: Secondary | ICD-10-CM | POA: Diagnosis not present

## 2014-01-04 DIAGNOSIS — K429 Umbilical hernia without obstruction or gangrene: Secondary | ICD-10-CM | POA: Diagnosis not present

## 2014-01-04 DIAGNOSIS — K760 Fatty (change of) liver, not elsewhere classified: Secondary | ICD-10-CM | POA: Diagnosis not present

## 2014-01-04 DIAGNOSIS — R109 Unspecified abdominal pain: Secondary | ICD-10-CM | POA: Diagnosis not present

## 2014-01-04 DIAGNOSIS — N390 Urinary tract infection, site not specified: Secondary | ICD-10-CM | POA: Diagnosis not present

## 2014-01-05 DIAGNOSIS — I10 Essential (primary) hypertension: Secondary | ICD-10-CM | POA: Diagnosis not present

## 2014-01-05 DIAGNOSIS — E780 Pure hypercholesterolemia: Secondary | ICD-10-CM | POA: Diagnosis not present

## 2014-01-05 DIAGNOSIS — R000 Tachycardia, unspecified: Secondary | ICD-10-CM | POA: Diagnosis not present

## 2014-01-05 DIAGNOSIS — R109 Unspecified abdominal pain: Secondary | ICD-10-CM | POA: Diagnosis not present

## 2014-01-05 DIAGNOSIS — E1165 Type 2 diabetes mellitus with hyperglycemia: Secondary | ICD-10-CM | POA: Diagnosis not present

## 2014-01-05 DIAGNOSIS — R1033 Periumbilical pain: Secondary | ICD-10-CM | POA: Diagnosis not present

## 2014-01-05 DIAGNOSIS — K432 Incisional hernia without obstruction or gangrene: Secondary | ICD-10-CM | POA: Diagnosis not present

## 2014-01-05 DIAGNOSIS — K429 Umbilical hernia without obstruction or gangrene: Secondary | ICD-10-CM | POA: Diagnosis not present

## 2014-01-05 DIAGNOSIS — I517 Cardiomegaly: Secondary | ICD-10-CM | POA: Diagnosis not present

## 2014-01-06 DIAGNOSIS — I10 Essential (primary) hypertension: Secondary | ICD-10-CM | POA: Diagnosis not present

## 2014-01-06 DIAGNOSIS — M79605 Pain in left leg: Secondary | ICD-10-CM | POA: Diagnosis not present

## 2014-01-06 DIAGNOSIS — F259 Schizoaffective disorder, unspecified: Secondary | ICD-10-CM | POA: Diagnosis not present

## 2014-01-06 DIAGNOSIS — K429 Umbilical hernia without obstruction or gangrene: Secondary | ICD-10-CM | POA: Diagnosis not present

## 2014-01-06 DIAGNOSIS — M79604 Pain in right leg: Secondary | ICD-10-CM | POA: Diagnosis not present

## 2014-01-06 DIAGNOSIS — E119 Type 2 diabetes mellitus without complications: Secondary | ICD-10-CM | POA: Diagnosis not present

## 2014-01-06 DIAGNOSIS — R1033 Periumbilical pain: Secondary | ICD-10-CM | POA: Diagnosis not present

## 2014-01-06 DIAGNOSIS — K219 Gastro-esophageal reflux disease without esophagitis: Secondary | ICD-10-CM | POA: Diagnosis not present

## 2014-01-06 DIAGNOSIS — F319 Bipolar disorder, unspecified: Secondary | ICD-10-CM | POA: Diagnosis not present

## 2014-01-07 DIAGNOSIS — E1140 Type 2 diabetes mellitus with diabetic neuropathy, unspecified: Secondary | ICD-10-CM | POA: Diagnosis not present

## 2014-01-07 DIAGNOSIS — I10 Essential (primary) hypertension: Secondary | ICD-10-CM | POA: Diagnosis not present

## 2014-01-07 DIAGNOSIS — K660 Peritoneal adhesions (postprocedural) (postinfection): Secondary | ICD-10-CM | POA: Diagnosis not present

## 2014-01-07 DIAGNOSIS — K430 Incisional hernia with obstruction, without gangrene: Secondary | ICD-10-CM | POA: Diagnosis not present

## 2014-01-07 DIAGNOSIS — F319 Bipolar disorder, unspecified: Secondary | ICD-10-CM | POA: Diagnosis not present

## 2014-01-07 DIAGNOSIS — Z79899 Other long term (current) drug therapy: Secondary | ICD-10-CM | POA: Diagnosis not present

## 2014-01-08 DIAGNOSIS — I10 Essential (primary) hypertension: Secondary | ICD-10-CM | POA: Diagnosis not present

## 2014-01-08 DIAGNOSIS — K660 Peritoneal adhesions (postprocedural) (postinfection): Secondary | ICD-10-CM | POA: Diagnosis not present

## 2014-01-08 DIAGNOSIS — E1140 Type 2 diabetes mellitus with diabetic neuropathy, unspecified: Secondary | ICD-10-CM | POA: Diagnosis not present

## 2014-01-08 DIAGNOSIS — K430 Incisional hernia with obstruction, without gangrene: Secondary | ICD-10-CM | POA: Diagnosis not present

## 2014-01-08 DIAGNOSIS — Z79899 Other long term (current) drug therapy: Secondary | ICD-10-CM | POA: Diagnosis not present

## 2014-01-09 DIAGNOSIS — K660 Peritoneal adhesions (postprocedural) (postinfection): Secondary | ICD-10-CM | POA: Diagnosis not present

## 2014-01-09 DIAGNOSIS — K430 Incisional hernia with obstruction, without gangrene: Secondary | ICD-10-CM | POA: Diagnosis not present

## 2014-01-09 DIAGNOSIS — Z79899 Other long term (current) drug therapy: Secondary | ICD-10-CM | POA: Diagnosis not present

## 2014-01-09 DIAGNOSIS — I10 Essential (primary) hypertension: Secondary | ICD-10-CM | POA: Diagnosis not present

## 2014-01-09 DIAGNOSIS — E1140 Type 2 diabetes mellitus with diabetic neuropathy, unspecified: Secondary | ICD-10-CM | POA: Diagnosis not present

## 2014-01-12 DIAGNOSIS — M79605 Pain in left leg: Secondary | ICD-10-CM | POA: Diagnosis not present

## 2014-01-12 DIAGNOSIS — M79604 Pain in right leg: Secondary | ICD-10-CM | POA: Diagnosis not present

## 2014-01-12 DIAGNOSIS — F319 Bipolar disorder, unspecified: Secondary | ICD-10-CM | POA: Diagnosis not present

## 2014-01-12 DIAGNOSIS — E119 Type 2 diabetes mellitus without complications: Secondary | ICD-10-CM | POA: Diagnosis not present

## 2014-01-12 DIAGNOSIS — F259 Schizoaffective disorder, unspecified: Secondary | ICD-10-CM | POA: Diagnosis not present

## 2014-01-12 DIAGNOSIS — I10 Essential (primary) hypertension: Secondary | ICD-10-CM | POA: Diagnosis not present

## 2014-01-14 DIAGNOSIS — M79604 Pain in right leg: Secondary | ICD-10-CM | POA: Diagnosis not present

## 2014-01-14 DIAGNOSIS — E119 Type 2 diabetes mellitus without complications: Secondary | ICD-10-CM | POA: Diagnosis not present

## 2014-01-14 DIAGNOSIS — F259 Schizoaffective disorder, unspecified: Secondary | ICD-10-CM | POA: Diagnosis not present

## 2014-01-14 DIAGNOSIS — F319 Bipolar disorder, unspecified: Secondary | ICD-10-CM | POA: Diagnosis not present

## 2014-01-14 DIAGNOSIS — M79605 Pain in left leg: Secondary | ICD-10-CM | POA: Diagnosis not present

## 2014-01-14 DIAGNOSIS — I10 Essential (primary) hypertension: Secondary | ICD-10-CM | POA: Diagnosis not present

## 2014-01-19 DIAGNOSIS — I10 Essential (primary) hypertension: Secondary | ICD-10-CM | POA: Diagnosis not present

## 2014-01-19 DIAGNOSIS — F259 Schizoaffective disorder, unspecified: Secondary | ICD-10-CM | POA: Diagnosis not present

## 2014-01-19 DIAGNOSIS — M79605 Pain in left leg: Secondary | ICD-10-CM | POA: Diagnosis not present

## 2014-01-19 DIAGNOSIS — F319 Bipolar disorder, unspecified: Secondary | ICD-10-CM | POA: Diagnosis not present

## 2014-01-19 DIAGNOSIS — M79604 Pain in right leg: Secondary | ICD-10-CM | POA: Diagnosis not present

## 2014-01-19 DIAGNOSIS — E119 Type 2 diabetes mellitus without complications: Secondary | ICD-10-CM | POA: Diagnosis not present

## 2014-01-20 DIAGNOSIS — M79604 Pain in right leg: Secondary | ICD-10-CM | POA: Diagnosis not present

## 2014-01-20 DIAGNOSIS — R0789 Other chest pain: Secondary | ICD-10-CM | POA: Diagnosis not present

## 2014-01-20 DIAGNOSIS — E119 Type 2 diabetes mellitus without complications: Secondary | ICD-10-CM | POA: Diagnosis not present

## 2014-01-20 DIAGNOSIS — F250 Schizoaffective disorder, bipolar type: Secondary | ICD-10-CM | POA: Diagnosis not present

## 2014-01-20 DIAGNOSIS — E780 Pure hypercholesterolemia: Secondary | ICD-10-CM | POA: Diagnosis not present

## 2014-01-20 DIAGNOSIS — F329 Major depressive disorder, single episode, unspecified: Secondary | ICD-10-CM | POA: Diagnosis not present

## 2014-01-20 DIAGNOSIS — R112 Nausea with vomiting, unspecified: Secondary | ICD-10-CM | POA: Diagnosis not present

## 2014-01-20 DIAGNOSIS — M79605 Pain in left leg: Secondary | ICD-10-CM | POA: Diagnosis not present

## 2014-01-20 DIAGNOSIS — F419 Anxiety disorder, unspecified: Secondary | ICD-10-CM | POA: Diagnosis not present

## 2014-01-20 DIAGNOSIS — F319 Bipolar disorder, unspecified: Secondary | ICD-10-CM | POA: Diagnosis not present

## 2014-01-20 DIAGNOSIS — I10 Essential (primary) hypertension: Secondary | ICD-10-CM | POA: Diagnosis not present

## 2014-01-20 DIAGNOSIS — F259 Schizoaffective disorder, unspecified: Secondary | ICD-10-CM | POA: Diagnosis not present

## 2014-01-20 DIAGNOSIS — R51 Headache: Secondary | ICD-10-CM | POA: Diagnosis not present

## 2014-01-21 DIAGNOSIS — I10 Essential (primary) hypertension: Secondary | ICD-10-CM | POA: Diagnosis not present

## 2014-01-21 DIAGNOSIS — F319 Bipolar disorder, unspecified: Secondary | ICD-10-CM | POA: Diagnosis not present

## 2014-01-21 DIAGNOSIS — E119 Type 2 diabetes mellitus without complications: Secondary | ICD-10-CM | POA: Diagnosis not present

## 2014-01-21 DIAGNOSIS — M79605 Pain in left leg: Secondary | ICD-10-CM | POA: Diagnosis not present

## 2014-01-21 DIAGNOSIS — M79604 Pain in right leg: Secondary | ICD-10-CM | POA: Diagnosis not present

## 2014-01-21 DIAGNOSIS — F259 Schizoaffective disorder, unspecified: Secondary | ICD-10-CM | POA: Diagnosis not present

## 2014-01-23 ENCOUNTER — Encounter (HOSPITAL_COMMUNITY): Payer: Self-pay | Admitting: *Deleted

## 2014-01-23 ENCOUNTER — Emergency Department (HOSPITAL_COMMUNITY): Payer: Medicare Other

## 2014-01-23 ENCOUNTER — Emergency Department (HOSPITAL_COMMUNITY)
Admission: EM | Admit: 2014-01-23 | Discharge: 2014-01-23 | Discharge status: Against Medical Advice | Payer: Medicare Other | Attending: Emergency Medicine | Admitting: Emergency Medicine

## 2014-01-23 DIAGNOSIS — Z3202 Encounter for pregnancy test, result negative: Secondary | ICD-10-CM | POA: Diagnosis not present

## 2014-01-23 DIAGNOSIS — I10 Essential (primary) hypertension: Secondary | ICD-10-CM | POA: Diagnosis not present

## 2014-01-23 DIAGNOSIS — Z85831 Personal history of malignant neoplasm of soft tissue: Secondary | ICD-10-CM | POA: Diagnosis not present

## 2014-01-23 DIAGNOSIS — R109 Unspecified abdominal pain: Secondary | ICD-10-CM | POA: Diagnosis present

## 2014-01-23 DIAGNOSIS — F319 Bipolar disorder, unspecified: Secondary | ICD-10-CM | POA: Insufficient documentation

## 2014-01-23 DIAGNOSIS — F209 Schizophrenia, unspecified: Secondary | ICD-10-CM | POA: Insufficient documentation

## 2014-01-23 DIAGNOSIS — Z79899 Other long term (current) drug therapy: Secondary | ICD-10-CM | POA: Insufficient documentation

## 2014-01-23 DIAGNOSIS — R52 Pain, unspecified: Secondary | ICD-10-CM

## 2014-01-23 DIAGNOSIS — R110 Nausea: Secondary | ICD-10-CM | POA: Insufficient documentation

## 2014-01-23 DIAGNOSIS — R1084 Generalized abdominal pain: Secondary | ICD-10-CM

## 2014-01-23 DIAGNOSIS — F419 Anxiety disorder, unspecified: Secondary | ICD-10-CM | POA: Insufficient documentation

## 2014-01-23 DIAGNOSIS — R1031 Right lower quadrant pain: Secondary | ICD-10-CM | POA: Diagnosis not present

## 2014-01-23 DIAGNOSIS — E119 Type 2 diabetes mellitus without complications: Secondary | ICD-10-CM | POA: Diagnosis not present

## 2014-01-23 LAB — URINE MICROSCOPIC-ADD ON

## 2014-01-23 LAB — CBC WITH DIFFERENTIAL/PLATELET
BASOS PCT: 0 % (ref 0–1)
Basophils Absolute: 0 10*3/uL (ref 0.0–0.1)
Eosinophils Absolute: 0.3 10*3/uL (ref 0.0–0.7)
Eosinophils Relative: 4 % (ref 0–5)
HCT: 37.6 % (ref 36.0–46.0)
Hemoglobin: 12.1 g/dL (ref 12.0–15.0)
Lymphocytes Relative: 25 % (ref 12–46)
Lymphs Abs: 1.7 10*3/uL (ref 0.7–4.0)
MCH: 27.2 pg (ref 26.0–34.0)
MCHC: 32.2 g/dL (ref 30.0–36.0)
MCV: 84.5 fL (ref 78.0–100.0)
Monocytes Absolute: 0.3 10*3/uL (ref 0.1–1.0)
Monocytes Relative: 5 % (ref 3–12)
NEUTROS ABS: 4.4 10*3/uL (ref 1.7–7.7)
Neutrophils Relative %: 66 % (ref 43–77)
Platelets: 239 10*3/uL (ref 150–400)
RBC: 4.45 MIL/uL (ref 3.87–5.11)
RDW: 14.4 % (ref 11.5–15.5)
WBC: 6.7 10*3/uL (ref 4.0–10.5)

## 2014-01-23 LAB — URINALYSIS, ROUTINE W REFLEX MICROSCOPIC
Bilirubin Urine: NEGATIVE
Glucose, UA: NEGATIVE mg/dL
Hgb urine dipstick: NEGATIVE
Ketones, ur: NEGATIVE mg/dL
Nitrite: NEGATIVE
Protein, ur: NEGATIVE mg/dL
Specific Gravity, Urine: >1.03 — ABNORMAL HIGH (ref 1.005–1.030)
Urobilinogen, UA: 0.2 mg/dL (ref 0.0–1.0)
pH: 5.5 (ref 5.0–8.0)

## 2014-01-23 LAB — COMPREHENSIVE METABOLIC PANEL
ALBUMIN: 3.8 g/dL (ref 3.5–5.2)
ALT: 43 U/L — AB (ref 0–35)
AST: 75 U/L — AB (ref 0–37)
Alkaline Phosphatase: 103 U/L (ref 39–117)
Anion gap: 8 (ref 5–15)
BUN: 9 mg/dL (ref 6–23)
CALCIUM: 8.6 mg/dL (ref 8.4–10.5)
CHLORIDE: 103 meq/L (ref 96–112)
CO2: 24 mmol/L (ref 19–32)
Creatinine, Ser: 0.47 mg/dL — ABNORMAL LOW (ref 0.50–1.10)
GFR calc Af Amer: >90 mL/min (ref >90)
Glucose, Bld: 141 mg/dL — ABNORMAL HIGH (ref 70–99)
Potassium: 3.6 mmol/L (ref 3.5–5.1)
SODIUM: 135 mmol/L (ref 135–145)
Total Bilirubin: 0.3 mg/dL (ref 0.3–1.2)
Total Protein: 7.3 g/dL (ref 6.0–8.3)

## 2014-01-23 LAB — PREGNANCY, URINE: PREG TEST UR: NEGATIVE

## 2014-01-23 LAB — LIPASE, BLOOD: LIPASE: 32 U/L (ref 11–59)

## 2014-01-23 MED ORDER — ONDANSETRON HCL 4 MG/2ML IJ SOLN
4.0000 mg | Freq: Once | INTRAMUSCULAR | Status: AC
  Administered 2014-01-23: 4 mg via INTRAVENOUS
  Filled 2014-01-23: qty 2

## 2014-01-23 MED ORDER — IOHEXOL 300 MG/ML  SOLN
50.0000 mL | Freq: Once | INTRAMUSCULAR | Status: AC | PRN
  Administered 2014-01-23: 50 mL via ORAL

## 2014-01-23 MED ORDER — METOCLOPRAMIDE HCL 5 MG/ML IJ SOLN
10.0000 mg | Freq: Once | INTRAMUSCULAR | Status: AC
  Administered 2014-01-23: 10 mg via INTRAVENOUS
  Filled 2014-01-23: qty 2

## 2014-01-23 NOTE — ED Notes (Addendum)
Pain RLQ, onset today, vomiting , nausea, no diarrhea,  Temp 100.8 last night.  No uti sx  Had hernia repair inDec 2015  At [facility]

## 2014-01-23 NOTE — ED Provider Notes (Signed)
CSN: [id number]     Arrival date & time 01/23/14  1756 History  This chart was scribed for Shaune Pollack, MD by Chester Holstein, ED Scribe. This patient was seen in room APA06/APA06 and the patient's care was started at 6:37 PM.    Chief Complaint  Patient presents with  . Abdominal Pain    Patient is a 29 y.o. female presenting with abdominal pain. The history is provided by the patient. No language interpreter was used.  Abdominal Pain Associated symptoms: nausea and vomiting   Associated symptoms: no diarrhea    HPI Comments: Tina Saunders is a 29 y.o. female with PMHx of HTN  who presents to the Emergency Department complaining of sharp right lower quadrant abdominal pain with onset this morning.  Pt notes the pain woke her from her sleep. Pt states the pain does not radiate. She notes associated nausea and vomiting 5 times. Pt has PMHx of abdominal pain but notes this pain is new.  Pt notes hernia repair surgery in her umbilical region on 51/02/5850 at [facility]. Pt denies diarrhea and complications with eating status post. Pt is unsure of what medications she is currently taking. Pt is visiting a relative.  Pt's PCP is Dr. Tobie Lords with [facility].    Past Medical History  Diagnosis Date  . Polycystic ovarian syndrome 07/01/2011    Patient report  . Anxiety   . Depression   . Hypertension   . Obesity   . Schizophrenia   . Bipolar 1 disorder   . Obesity   . Cancer of abdominal wall   . Rhabdosarcoma   . Diabetes mellitus without complication    Past Surgical History  Procedure Laterality Date  . Cholecystectomy    . Varicose vein surgery    . Ovarian cyst excision    . Hernia repair     Family History  Problem Relation Age of Onset  . Coronary artery disease Maternal Grandmother   . Diabetes type II Maternal Grandmother   . Cancer Maternal Grandmother   . Hypertension Mother   . Hypertension Father    History  Substance Use Topics  . Smoking  status: Never Smoker   . Smokeless tobacco: Never Used  . Alcohol Use: No   OB History    Gravida Para Term Preterm AB TAB SAB Ectopic Multiple Living   0              Review of Systems  Gastrointestinal: Positive for nausea, vomiting and abdominal pain. Negative for diarrhea.      Allergies  Fish-derived products; Geodon; Haldol; Compazine; Morphine and related; Toradol; and Buprenorphine hcl  Home Medications   Prior to Admission medications   Medication Sig Start Date End Date Taking? Authorizing Provider  busPIRone (BUSPAR) 15 MG tablet Take 15 mg by mouth 3 (three) times daily.   Yes Historical Provider, MD  gabapentin (NEURONTIN) 400 MG capsule Take 400 mg by mouth 3 (three) times daily.   Yes Historical Provider, MD  Melatonin 3 MG TABS Take 3 mg by mouth daily as needed (for sleep).   Yes Historical Provider, MD  metFORMIN (GLUCOPHAGE-XR) 500 MG 24 hr tablet Take 500 mg by mouth daily.   Yes Historical Provider, MD  metoprolol succinate (TOPROL-XL) 25 MG 24 hr tablet Take 25 mg by mouth daily.   Yes Historical Provider, MD  pantoprazole (PROTONIX) 40 MG tablet Take 40 mg by mouth at bedtime.   Yes Historical Provider, MD  QUEtiapine (SEROQUEL XR) 400 MG 24 hr tablet Take 800 mg by mouth at bedtime.   Yes Historical Provider, MD  sertraline (ZOLOFT) 50 MG tablet Take 150 mg by mouth at bedtime.    Yes Historical Provider, MD  zolpidem (AMBIEN) 10 MG tablet Take 10 mg by mouth at bedtime as needed for sleep.   Yes Historical Provider, MD  cephALEXin (KEFLEX) 500 MG capsule Take 1 capsule (500 mg total) by mouth 3 (three) times daily. Patient not taking: Reported on 01/23/2014 12/28/13   Margarita Mail, PA-C  ondansetron (ZOFRAN) 4 MG tablet Take 1 tablet (4 mg total) by mouth every 8 (eight) hours as needed for nausea or vomiting. Patient not taking: Reported on 01/23/2014 12/28/13   Margarita Mail, PA-C  phenazopyridine (PYRIDIUM) 200 MG tablet Take 1 tablet (200 mg total) by  mouth 3 (three) times daily as needed (pain with urination). Patient not taking: Reported on 01/23/2014 12/28/13   Margarita Mail, PA-C  traMADol (ULTRAM) 50 MG tablet Take 1 tablet (50 mg total) by mouth every 6 (six) hours as needed. Patient not taking: Reported on 01/23/2014 12/28/13   Margarita Mail, PA-C   BP 165/86 mmHg  Pulse 82  Temp(Src) 99 F (37.2 C) (Oral)  Resp 18  Ht 5\' 4"  (1.626 m)  Wt 273 lb (123.832 kg)  BMI 46.84 kg/m2  SpO2 99%  LMP 01/13/2014 Physical Exam  Constitutional: She is oriented to person, place, and time. She appears well-developed and well-nourished.  Morbidly obese  HENT:  Head: Normocephalic and atraumatic.  Right Ear: External ear normal.  Left Ear: External ear normal.  Nose: Nose normal.  Mouth/Throat: Oropharynx is clear and moist.  Eyes: Conjunctivae and EOM are normal. Pupils are equal, round, and reactive to light.  Cardiovascular: Normal rate and regular rhythm.   Pulmonary/Chest: Effort normal and breath sounds normal.  Abdominal: Soft. Bowel sounds are normal.  Diffuse tenderness palpation  Musculoskeletal: Normal range of motion.  Neurological: She is alert and oriented to person, place, and time.  Skin: Skin is warm and dry.  Psychiatric: She has a normal mood and affect.  Nursing note and vitals reviewed.   ED Course  Procedures (including critical care time) DIAGNOSTIC STUDIES: Oxygen Saturation is 99% on room air, normal by my interpretation.    COORDINATION OF CARE: 6:41 PM Discussed treatment plan with patient at beside, the patient agrees with the plan and has no further questions at this time.   Labs Review Labs Reviewed  URINALYSIS, ROUTINE W REFLEX MICROSCOPIC  PREGNANCY, URINE  CBC WITH DIFFERENTIAL  COMPREHENSIVE METABOLIC PANEL  LIPASE, BLOOD  OCCULT BLOOD X 1 CARD TO LAB, STOOL    Imaging Review No results found.   EKG Interpretation None      MDM   Final diagnoses:  Pain  Generalized abdominal pain    patient states she wishes to leave [place]. I discussed with her need for further evaluation but she states that she just wishes to go home. Patient informed she could return at any time and voices understanding.  Shaune Pollack, MD 01/23/14 [phone]

## 2014-01-28 DIAGNOSIS — E119 Type 2 diabetes mellitus without complications: Secondary | ICD-10-CM | POA: Diagnosis not present

## 2014-01-28 DIAGNOSIS — F259 Schizoaffective disorder, unspecified: Secondary | ICD-10-CM | POA: Diagnosis not present

## 2014-01-28 DIAGNOSIS — F319 Bipolar disorder, unspecified: Secondary | ICD-10-CM | POA: Diagnosis not present

## 2014-01-28 DIAGNOSIS — I10 Essential (primary) hypertension: Secondary | ICD-10-CM | POA: Diagnosis not present

## 2014-01-28 DIAGNOSIS — M79604 Pain in right leg: Secondary | ICD-10-CM | POA: Diagnosis not present

## 2014-01-28 DIAGNOSIS — M79605 Pain in left leg: Secondary | ICD-10-CM | POA: Diagnosis not present

## 2014-01-31 DIAGNOSIS — E669 Obesity, unspecified: Secondary | ICD-10-CM | POA: Diagnosis not present

## 2014-01-31 DIAGNOSIS — I10 Essential (primary) hypertension: Secondary | ICD-10-CM | POA: Diagnosis not present

## 2014-01-31 DIAGNOSIS — Z79899 Other long term (current) drug therapy: Secondary | ICD-10-CM | POA: Diagnosis not present

## 2014-01-31 DIAGNOSIS — Z888 Allergy status to other drugs, medicaments and biological substances status: Secondary | ICD-10-CM | POA: Diagnosis not present

## 2014-01-31 DIAGNOSIS — R161 Splenomegaly, not elsewhere classified: Secondary | ICD-10-CM | POA: Diagnosis not present

## 2014-01-31 DIAGNOSIS — K439 Ventral hernia without obstruction or gangrene: Secondary | ICD-10-CM | POA: Diagnosis not present

## 2014-01-31 DIAGNOSIS — Z886 Allergy status to analgesic agent status: Secondary | ICD-10-CM | POA: Diagnosis not present

## 2014-01-31 DIAGNOSIS — R1030 Lower abdominal pain, unspecified: Secondary | ICD-10-CM | POA: Diagnosis not present

## 2014-01-31 DIAGNOSIS — E119 Type 2 diabetes mellitus without complications: Secondary | ICD-10-CM | POA: Diagnosis not present

## 2014-01-31 DIAGNOSIS — K219 Gastro-esophageal reflux disease without esophagitis: Secondary | ICD-10-CM | POA: Diagnosis not present

## 2014-01-31 DIAGNOSIS — Z6841 Body Mass Index (BMI) 40.0 and over, adult: Secondary | ICD-10-CM | POA: Diagnosis not present

## 2014-02-03 DIAGNOSIS — E119 Type 2 diabetes mellitus without complications: Secondary | ICD-10-CM | POA: Diagnosis not present

## 2014-02-03 DIAGNOSIS — I10 Essential (primary) hypertension: Secondary | ICD-10-CM | POA: Diagnosis not present

## 2014-02-03 DIAGNOSIS — M79604 Pain in right leg: Secondary | ICD-10-CM | POA: Diagnosis not present

## 2014-02-03 DIAGNOSIS — M79605 Pain in left leg: Secondary | ICD-10-CM | POA: Diagnosis not present

## 2014-02-03 DIAGNOSIS — F319 Bipolar disorder, unspecified: Secondary | ICD-10-CM | POA: Diagnosis not present

## 2014-02-03 DIAGNOSIS — F259 Schizoaffective disorder, unspecified: Secondary | ICD-10-CM | POA: Diagnosis not present

## 2014-02-06 DIAGNOSIS — I10 Essential (primary) hypertension: Secondary | ICD-10-CM | POA: Diagnosis not present

## 2014-02-06 DIAGNOSIS — F209 Schizophrenia, unspecified: Secondary | ICD-10-CM | POA: Diagnosis not present

## 2014-02-06 DIAGNOSIS — F319 Bipolar disorder, unspecified: Secondary | ICD-10-CM | POA: Diagnosis not present

## 2014-02-06 DIAGNOSIS — E118 Type 2 diabetes mellitus with unspecified complications: Secondary | ICD-10-CM | POA: Diagnosis not present

## 2014-02-06 DIAGNOSIS — R161 Splenomegaly, not elsewhere classified: Secondary | ICD-10-CM | POA: Diagnosis not present

## 2014-02-06 DIAGNOSIS — R112 Nausea with vomiting, unspecified: Secondary | ICD-10-CM | POA: Diagnosis not present

## 2014-02-06 DIAGNOSIS — R1031 Right lower quadrant pain: Secondary | ICD-10-CM | POA: Diagnosis not present

## 2014-02-06 DIAGNOSIS — R918 Other nonspecific abnormal finding of lung field: Secondary | ICD-10-CM | POA: Diagnosis not present

## 2014-02-06 DIAGNOSIS — K760 Fatty (change of) liver, not elsewhere classified: Secondary | ICD-10-CM | POA: Diagnosis not present

## 2014-02-06 DIAGNOSIS — Z9049 Acquired absence of other specified parts of digestive tract: Secondary | ICD-10-CM | POA: Diagnosis not present

## 2014-02-06 DIAGNOSIS — K219 Gastro-esophageal reflux disease without esophagitis: Secondary | ICD-10-CM | POA: Diagnosis not present

## 2014-02-06 DIAGNOSIS — K5730 Diverticulosis of large intestine without perforation or abscess without bleeding: Secondary | ICD-10-CM | POA: Diagnosis not present

## 2014-02-06 DIAGNOSIS — F419 Anxiety disorder, unspecified: Secondary | ICD-10-CM | POA: Diagnosis not present

## 2014-02-06 DIAGNOSIS — R109 Unspecified abdominal pain: Secondary | ICD-10-CM | POA: Diagnosis not present

## 2014-02-06 DIAGNOSIS — R162 Hepatomegaly with splenomegaly, not elsewhere classified: Secondary | ICD-10-CM | POA: Diagnosis not present

## 2014-02-10 ENCOUNTER — Emergency Department (HOSPITAL_COMMUNITY): Payer: Medicare Other

## 2014-02-10 ENCOUNTER — Emergency Department (HOSPITAL_COMMUNITY)
Admission: EM | Admit: 2014-02-10 | Discharge: 2014-02-11 | Disposition: A | Discharge status: Home / Self Care | Payer: Medicare Other | Attending: Emergency Medicine | Admitting: Emergency Medicine

## 2014-02-10 ENCOUNTER — Encounter (HOSPITAL_COMMUNITY): Payer: Self-pay

## 2014-02-10 DIAGNOSIS — Z85831 Personal history of malignant neoplasm of soft tissue: Secondary | ICD-10-CM | POA: Diagnosis not present

## 2014-02-10 DIAGNOSIS — F319 Bipolar disorder, unspecified: Secondary | ICD-10-CM | POA: Insufficient documentation

## 2014-02-10 DIAGNOSIS — R0602 Shortness of breath: Secondary | ICD-10-CM | POA: Diagnosis not present

## 2014-02-10 DIAGNOSIS — E119 Type 2 diabetes mellitus without complications: Secondary | ICD-10-CM | POA: Diagnosis not present

## 2014-02-10 DIAGNOSIS — F209 Schizophrenia, unspecified: Secondary | ICD-10-CM | POA: Diagnosis not present

## 2014-02-10 DIAGNOSIS — E669 Obesity, unspecified: Secondary | ICD-10-CM | POA: Diagnosis not present

## 2014-02-10 DIAGNOSIS — R112 Nausea with vomiting, unspecified: Secondary | ICD-10-CM | POA: Insufficient documentation

## 2014-02-10 DIAGNOSIS — G8929 Other chronic pain: Secondary | ICD-10-CM | POA: Insufficient documentation

## 2014-02-10 DIAGNOSIS — F419 Anxiety disorder, unspecified: Secondary | ICD-10-CM | POA: Diagnosis not present

## 2014-02-10 DIAGNOSIS — E282 Polycystic ovarian syndrome: Secondary | ICD-10-CM | POA: Diagnosis not present

## 2014-02-10 DIAGNOSIS — Z79899 Other long term (current) drug therapy: Secondary | ICD-10-CM | POA: Diagnosis not present

## 2014-02-10 DIAGNOSIS — Z3202 Encounter for pregnancy test, result negative: Secondary | ICD-10-CM | POA: Insufficient documentation

## 2014-02-10 DIAGNOSIS — I10 Essential (primary) hypertension: Secondary | ICD-10-CM | POA: Insufficient documentation

## 2014-02-10 DIAGNOSIS — R109 Unspecified abdominal pain: Secondary | ICD-10-CM

## 2014-02-10 DIAGNOSIS — K7689 Other specified diseases of liver: Secondary | ICD-10-CM | POA: Diagnosis not present

## 2014-02-10 DIAGNOSIS — R162 Hepatomegaly with splenomegaly, not elsewhere classified: Secondary | ICD-10-CM | POA: Diagnosis not present

## 2014-02-10 DIAGNOSIS — R1011 Right upper quadrant pain: Secondary | ICD-10-CM | POA: Diagnosis not present

## 2014-02-10 DIAGNOSIS — Z85028 Personal history of other malignant neoplasm of stomach: Secondary | ICD-10-CM | POA: Diagnosis not present

## 2014-02-10 LAB — CBC WITH DIFFERENTIAL/PLATELET
BASOS ABS: 0 10*3/uL (ref 0.0–0.1)
Basophils Relative: 0 % (ref 0–1)
EOS ABS: 0.3 10*3/uL (ref 0.0–0.7)
EOS PCT: 4 % (ref 0–5)
HEMATOCRIT: 36.3 % (ref 36.0–46.0)
Hemoglobin: 12.1 g/dL (ref 12.0–15.0)
LYMPHS ABS: 1.2 10*3/uL (ref 0.7–4.0)
LYMPHS PCT: 18 % (ref 12–46)
MCH: 27.6 pg (ref 26.0–34.0)
MCHC: 33.3 g/dL (ref 30.0–36.0)
MCV: 82.9 fL (ref 78.0–100.0)
MONOS PCT: 6 % (ref 3–12)
Monocytes Absolute: 0.4 10*3/uL (ref 0.1–1.0)
NEUTROS PCT: 72 % (ref 43–77)
Neutro Abs: 5 10*3/uL (ref 1.7–7.7)
PLATELETS: 190 10*3/uL (ref 150–400)
RBC: 4.38 MIL/uL (ref 3.87–5.11)
RDW: 15.1 % (ref 11.5–15.5)
WBC: 6.9 10*3/uL (ref 4.0–10.5)

## 2014-02-10 LAB — COMPREHENSIVE METABOLIC PANEL
ALK PHOS: 104 U/L (ref 39–117)
ALT: 44 U/L — AB (ref 0–35)
AST: 79 U/L — AB (ref 0–37)
Albumin: 3.8 g/dL (ref 3.5–5.2)
Anion gap: 8 (ref 5–15)
BUN: 8 mg/dL (ref 6–23)
CHLORIDE: 102 meq/L (ref 96–112)
CO2: 27 mmol/L (ref 19–32)
Calcium: 9.2 mg/dL (ref 8.4–10.5)
Creatinine, Ser: 0.54 mg/dL (ref 0.50–1.10)
Glucose, Bld: 134 mg/dL — ABNORMAL HIGH (ref 70–99)
Potassium: 3.9 mmol/L (ref 3.5–5.1)
SODIUM: 137 mmol/L (ref 135–145)
Total Bilirubin: 0.4 mg/dL (ref 0.3–1.2)
Total Protein: 7.3 g/dL (ref 6.0–8.3)

## 2014-02-10 LAB — URINE MICROSCOPIC-ADD ON

## 2014-02-10 LAB — URINALYSIS, ROUTINE W REFLEX MICROSCOPIC
Bilirubin Urine: NEGATIVE
GLUCOSE, UA: NEGATIVE mg/dL
Hgb urine dipstick: NEGATIVE
Ketones, ur: NEGATIVE mg/dL
Nitrite: NEGATIVE
PH: 7 (ref 5.0–8.0)
Protein, ur: NEGATIVE mg/dL
SPECIFIC GRAVITY, URINE: 1.026 (ref 1.005–1.030)
UROBILINOGEN UA: 0.2 mg/dL (ref 0.0–1.0)

## 2014-02-10 LAB — PREGNANCY, URINE: PREG TEST UR: NEGATIVE

## 2014-02-10 LAB — LIPASE, BLOOD: Lipase: 34 U/L (ref 11–59)

## 2014-02-10 MED ORDER — HYDROMORPHONE HCL 1 MG/ML IJ SOLN
0.5000 mg | Freq: Once | INTRAMUSCULAR | Status: AC
  Administered 2014-02-10: 0.5 mg via INTRAVENOUS
  Filled 2014-02-10: qty 1

## 2014-02-10 MED ORDER — IOHEXOL 300 MG/ML  SOLN
100.0000 mL | Freq: Once | INTRAMUSCULAR | Status: AC | PRN
  Administered 2014-02-10: 100 mL via INTRAVENOUS

## 2014-02-10 MED ORDER — ONDANSETRON HCL 4 MG/2ML IJ SOLN
4.0000 mg | Freq: Once | INTRAMUSCULAR | Status: AC
  Administered 2014-02-10: 4 mg via INTRAVENOUS
  Filled 2014-02-10: qty 2

## 2014-02-10 MED ORDER — IOHEXOL 300 MG/ML  SOLN
25.0000 mL | Freq: Once | INTRAMUSCULAR | Status: AC | PRN
  Administered 2014-02-10: 25 mL via ORAL

## 2014-02-10 MED ORDER — ONDANSETRON 4 MG PO TBDP: ORAL_TABLET | ORAL | Status: DC

## 2014-02-10 MED ORDER — SODIUM CHLORIDE 0.9 % IV BOLUS (SEPSIS)
1000.0000 mL | Freq: Once | INTRAVENOUS | Status: AC
  Administered 2014-02-10: 1000 mL via INTRAVENOUS

## 2014-02-10 NOTE — ED Notes (Signed)
CT called and notified that pt finished contrast.

## 2014-02-10 NOTE — ED Notes (Signed)
Pt reports RUQ pain that started two days ago.  Sts she has thrown up 7-8 times since then.  No diarrhea reported.  Denies fever or chills.

## 2014-02-10 NOTE — ED Notes (Signed)
Pt tolerating fluids well. 

## 2014-02-10 NOTE — Discharge Instructions (Signed)
Please call your doctor for a followup appointment within 24-48 hours. When you talk to your doctor please let them know that you were seen in the emergency department and have them acquire all of your records so that they can discuss the findings with you and formulate a treatment plan to fully care for your new and ongoing problems. Please follow-up with your primary care provider to be seen and reassessed this week Please follow-up with gastroenterology regarding ongoing abdominal issues Please take medications as prescribed Please avoid any fatty, greasy foods for this can lead to worsening abdominal pain Please continue to monitor symptoms closely and if symptoms are to worsen or change (fever greater than 101, chills, sweating, nausea, vomiting, chest pain, shortness of breathe, difficulty breathing, weakness, numbness, tingling, worsening or changes to pain pattern, blood in the stools, black tarry stools, inability to keep any food or fluids down) please report back to the Emergency Department immediately.    Abdominal Pain Many things can cause abdominal pain. Usually, abdominal pain is not caused by a disease and will improve without treatment. It can often be observed and treated at home. Your health care provider will do a physical exam and possibly order blood tests and X-rays to help determine the seriousness of your pain. However, in many cases, more time must pass before a clear cause of the pain can be found. Before that point, your health care provider may not know if you need more testing or further treatment. HOME CARE INSTRUCTIONS  Monitor your abdominal pain for any changes. The following actions may help to alleviate any discomfort you are experiencing:  Only take over-the-counter or prescription medicines as directed by your health care provider.  Do not take laxatives unless directed to do so by your health care provider.  Try a clear liquid diet (broth, tea, or water) as  directed by your health care provider. Slowly move to a bland diet as tolerated. SEEK MEDICAL CARE IF:  You have unexplained abdominal pain.  You have abdominal pain associated with nausea or diarrhea.  You have pain when you urinate or have a bowel movement.  You experience abdominal pain that wakes you in the night.  You have abdominal pain that is worsened or improved by eating food.  You have abdominal pain that is worsened with eating fatty foods.  You have a fever. SEEK IMMEDIATE MEDICAL CARE IF:   Your pain does not go away within 2 hours.  You keep throwing up (vomiting).  Your pain is felt only in portions of the abdomen, such as the right side or the left lower portion of the abdomen.  You pass bloody or black tarry stools. MAKE SURE YOU:  Understand these instructions.   Will watch your condition.   Will get help right away if you are not doing well or get worse.  Document Released: 10/19/2004 Document Revised: 01/14/2013 Document Reviewed: 09/18/2012 [facility] Patient Information 2015 [place], [place]. This information is not intended to replace advice given to you by your health care provider. Make sure you discuss any questions you have with your health care provider.

## 2014-02-10 NOTE — ED Provider Notes (Signed)
CSN: [id number]     Arrival date & time 02/10/14  1928 History   First MD Initiated Contact with Patient 02/10/14 1944     Chief Complaint  Patient presents with  . Abdominal Pain     (Consider location/radiation/quality/duration/timing/severity/associated sxs/prior Treatment) The history is provided by the patient. No language interpreter was used.  Tina Saunders is a 29 y/o F with PMHx of PCOS, anxiety, depression, obesity, schizophrenia, cancer of the abdominal wall, rhabdosarcoma, diabetes presenting to emergency department with right upper quadrant abdominal pain does been ongoing since 2 days ago. Patient reported the pain as a constant sharp stabbing sensation without radiation. Stated that when she has intense pain she experiences shortness of breath. Reported that she's been having nausea and vomiting that started. Reported proximal mid 78 episodes of emesis-NB/NB-reported mainly of food in stomach contents. Stated that she's been unable to keep any food or fluids down secondary to the pain. Patient reported that she had a cholecystectomy performed either 2010 at 2012, patient is unable to recall the exact year. Patient reported that she recently had surgery performed, hernia repair near the umbilical region on 88/50/2774 at [facility] by Dr. Algis Downs. Denied diarrhea, melena, hematochezia, chest pain, difficulty breathing, neck pain, neck stiffness, cough, fever, chills, nasal congestion, hematuria, dysuria, vaginal discharge, abnormal vaginal bleeding, pelvic pain, back pain. PCP Dr. Tobie Lords at [facility]  Past Medical History  Diagnosis Date  . Polycystic ovarian syndrome 07/01/2011    Patient report  . Anxiety   . Depression   . Hypertension   . Obesity   . Schizophrenia   . Bipolar 1 disorder   . Obesity   . Cancer of abdominal wall   . Rhabdosarcoma   . Diabetes mellitus without complication    Past Surgical History  Procedure Laterality Date  . Cholecystectomy     . Varicose vein surgery    . Ovarian cyst excision    . Hernia repair     Family History  Problem Relation Age of Onset  . Coronary artery disease Maternal Grandmother   . Diabetes type II Maternal Grandmother   . Cancer Maternal Grandmother   . Hypertension Mother   . Hypertension Father    History  Substance Use Topics  . Smoking status: Never Smoker   . Smokeless tobacco: Never Used  . Alcohol Use: No   OB History    Gravida Para Term Preterm AB TAB SAB Ectopic Multiple Living   0              Review of Systems  Constitutional: Negative for fever and chills.  Respiratory: Negative for chest tightness and shortness of breath.   Cardiovascular: Negative for chest pain.  Gastrointestinal: Positive for nausea, vomiting and abdominal pain. Negative for diarrhea, constipation, blood in stool and anal bleeding.  Genitourinary: Negative for dysuria, hematuria, decreased urine volume, vaginal bleeding, vaginal discharge, vaginal pain and pelvic pain.  Musculoskeletal: Negative for back pain, neck pain and neck stiffness.      Allergies  Fish-derived products; Geodon; Haldol; Compazine; Morphine and related; Toradol; and Buprenorphine hcl  Home Medications   Prior to Admission medications   Medication Sig Start Date End Date Taking? Authorizing Provider  busPIRone (BUSPAR) 15 MG tablet Take 15 mg by mouth 3 (three) times daily.   Yes Historical Provider, MD  gabapentin (NEURONTIN) 400 MG capsule Take 400 mg by mouth 3 (three) times daily.   Yes Historical Provider, MD  Melatonin 3 MG TABS  Take 3 mg by mouth daily as needed (for sleep).   Yes Historical Provider, MD  metFORMIN (GLUCOPHAGE-XR) 500 MG 24 hr tablet Take 500 mg by mouth daily.   Yes Historical Provider, MD  metoprolol succinate (TOPROL-XL) 25 MG 24 hr tablet Take 25 mg by mouth daily.   Yes Historical Provider, MD  ondansetron (ZOFRAN) 4 MG tablet Take 1 tablet (4 mg total) by mouth every 8 (eight) hours as  needed for nausea or vomiting. 12/28/13  Yes Margarita Mail, PA-C  pantoprazole (PROTONIX) 40 MG tablet Take 40 mg by mouth at bedtime.   Yes Historical Provider, MD  QUEtiapine (SEROQUEL XR) 400 MG 24 hr tablet Take 800 mg by mouth at bedtime.   Yes Historical Provider, MD  sertraline (ZOLOFT) 50 MG tablet Take 150 mg by mouth at bedtime.    Yes Historical Provider, MD  zolpidem (AMBIEN) 10 MG tablet Take 10 mg by mouth at bedtime as needed for sleep.   Yes Historical Provider, MD  cephALEXin (KEFLEX) 500 MG capsule Take 1 capsule (500 mg total) by mouth 3 (three) times daily. Patient not taking: Reported on 01/23/2014 12/28/13   Margarita Mail, PA-C  ondansetron (ZOFRAN ODT) 4 MG disintegrating tablet 4mg  ODT q4 hours prn nausea/vomit 02/10/14   Jai Steil, PA-C  phenazopyridine (PYRIDIUM) 200 MG tablet Take 1 tablet (200 mg total) by mouth 3 (three) times daily as needed (pain with urination). Patient not taking: Reported on 01/23/2014 12/28/13   Margarita Mail, PA-C  traMADol (ULTRAM) 50 MG tablet Take 1 tablet (50 mg total) by mouth every 6 (six) hours as needed. Patient not taking: Reported on 01/23/2014 12/28/13   Margarita Mail, PA-C   BP 142/79 mmHg  Pulse 100  Temp(Src) 98.1 F (36.7 C) (Oral)  Resp 18  Ht 5\' 4"  (1.626 m)  Wt 270 lb (122.471 kg)  BMI 46.32 kg/m2  SpO2 96%  LMP 01/23/2014 Physical Exam  Constitutional: She is oriented to person, place, and time. She appears well-developed and well-nourished. No distress.  HENT:  Head: Normocephalic and atraumatic.  Mouth/Throat: Oropharynx is clear and moist. No oropharyngeal exudate.  Eyes: Conjunctivae and EOM are normal. Pupils are equal, round, and reactive to light. Right eye exhibits no discharge. Left eye exhibits no discharge.  Neck: Normal range of motion. Neck supple. No tracheal deviation present.  Cardiovascular: Normal rate, regular rhythm and normal heart sounds.  Exam reveals no friction rub.   No murmur  heard. Pulmonary/Chest: Effort normal and breath sounds normal. No respiratory distress. She has no wheezes. She has no rales.  Abdominal: Soft. Bowel sounds are normal. She exhibits no distension. There is tenderness in the right upper quadrant. There is no rebound and no guarding.  Obese Bowel sounds normoactive in all 4 quadrants Abdomen soft upon palpation Discomfort upon palpation to the right upper quadrant Negative peritoneal signs Negative guarding or rigidity noted  Musculoskeletal: Normal range of motion.  Lymphadenopathy:    She has no cervical adenopathy.  Neurological: She is alert and oriented to person, place, and time. No cranial nerve deficit. She exhibits normal muscle tone. Coordination normal.  Skin: Skin is warm and dry. No rash noted. She is not diaphoretic. No erythema.  Psychiatric: She has a normal mood and affect. Her behavior is normal. Thought content normal.  Vitals reviewed.   ED Course  Procedures (including critical care time)  Results for orders placed or performed during the hospital encounter of 02/10/14  CBC with Differential  Result  Value Ref Range   WBC 6.9 4.0 - 10.5 K/uL   RBC 4.38 3.87 - 5.11 MIL/uL   Hemoglobin 12.1 12.0 - 15.0 g/dL   HCT 36.3 36.0 - 46.0 %   MCV 82.9 78.0 - 100.0 fL   MCH 27.6 26.0 - 34.0 pg   MCHC 33.3 30.0 - 36.0 g/dL   RDW 15.1 11.5 - 15.5 %   Platelets 190 150 - 400 K/uL   Neutrophils Relative % 72 43 - 77 %   Neutro Abs 5.0 1.7 - 7.7 K/uL   Lymphocytes Relative 18 12 - 46 %   Lymphs Abs 1.2 0.7 - 4.0 K/uL   Monocytes Relative 6 3 - 12 %   Monocytes Absolute 0.4 0.1 - 1.0 K/uL   Eosinophils Relative 4 0 - 5 %   Eosinophils Absolute 0.3 0.0 - 0.7 K/uL   Basophils Relative 0 0 - 1 %   Basophils Absolute 0.0 0.0 - 0.1 K/uL  Comprehensive metabolic panel  Result Value Ref Range   Sodium 137 135 - 145 mmol/L   Potassium 3.9 3.5 - 5.1 mmol/L   Chloride 102 96 - 112 mEq/L   CO2 27 19 - 32 mmol/L   Glucose, Bld  134 (H) 70 - 99 mg/dL   BUN 8 6 - 23 mg/dL   Creatinine, Ser 0.54 0.50 - 1.10 mg/dL   Calcium 9.2 8.4 - 10.5 mg/dL   Total Protein 7.3 6.0 - 8.3 g/dL   Albumin 3.8 3.5 - 5.2 g/dL   AST 79 (H) 0 - 37 U/L   ALT 44 (H) 0 - 35 U/L   Alkaline Phosphatase 104 39 - 117 U/L   Total Bilirubin 0.4 0.3 - 1.2 mg/dL   GFR calc non Af Amer >90 >90 mL/min   GFR calc Af Amer >90 >90 mL/min   Anion gap 8 5 - 15  Pregnancy, urine  Result Value Ref Range   Preg Test, Ur NEGATIVE NEGATIVE  Urinalysis, Routine w reflex microscopic  Result Value Ref Range   Color, Urine YELLOW YELLOW   APPearance CLEAR CLEAR   Specific Gravity, Urine 1.026 1.005 - 1.030   pH 7.0 5.0 - 8.0   Glucose, UA NEGATIVE NEGATIVE mg/dL   Hgb urine dipstick NEGATIVE NEGATIVE   Bilirubin Urine NEGATIVE NEGATIVE   Ketones, ur NEGATIVE NEGATIVE mg/dL   Protein, ur NEGATIVE NEGATIVE mg/dL   Urobilinogen, UA 0.2 0.0 - 1.0 mg/dL   Nitrite NEGATIVE NEGATIVE   Leukocytes, UA MODERATE (A) NEGATIVE  Lipase, blood  Result Value Ref Range   Lipase 34 11 - 59 U/L  Urine microscopic-add on  Result Value Ref Range   Squamous Epithelial / LPF MANY (A) RARE   WBC, UA 11-20 <3 WBC/hpf   RBC / HPF 0-2 <3 RBC/hpf   Bacteria, UA MANY (A) RARE   Urine-Other MUCOUS PRESENT     Labs Review Labs Reviewed  COMPREHENSIVE METABOLIC PANEL - Abnormal; Notable for the following:    Glucose, Bld 134 (*)    AST 79 (*)    ALT 44 (*)    All other components within normal limits  URINALYSIS, ROUTINE W REFLEX MICROSCOPIC - Abnormal; Notable for the following:    Leukocytes, UA MODERATE (*)    All other components within normal limits  URINE MICROSCOPIC-ADD ON - Abnormal; Notable for the following:    Squamous Epithelial / LPF MANY (*)    Bacteria, UA MANY (*)    All other components within normal  limits  URINE CULTURE  CBC WITH DIFFERENTIAL  PREGNANCY, URINE  LIPASE, BLOOD    Imaging Review Ct Abdomen Pelvis W Contrast  02/10/2014    CLINICAL DATA:  Pt reports RUQ pain that started two days ago. Sts she has thrown up 7-8 times since then. No diarrhea reported. Denies fever or chills.  EXAM: CT ABDOMEN AND PELVIS WITH CONTRAST  TECHNIQUE: Multidetector CT imaging of the abdomen and pelvis was performed using the standard protocol following bolus administration of intravenous contrast.  CONTRAST:  130mL OMNIPAQUE IOHEXOL 300 MG/ML  SOLN  COMPARISON:  01/01/2014  FINDINGS: Pre lung base essentially clear.  Liver is enlarged. There is extensive fatty infiltration. No liver mass or focal lesion.  Spleen enlarged measuring 17.1 cm x 6.6 cm x 14.4 cm. No splenic mass or focal lesion.  Gallbladder surgically absent.  No bile duct dilation.  Normal pancreas, adrenal glands and kidneys. Normal ureters and bladder.  5 cm right ovarian cyst, likely physiologic uterus and left ovary unremarkable.  No pathologically enlarged lymph nodes.  No ascites  Small bowel and colon are unremarkable.  Normal appendix.  Surgical mass lies along the anterior abdominal wall.  No hernia.  No significant bony abnormality.  IMPRESSION: 1. No acute findings. 2. Hepato splenomegaly, similar to the prior exam. 3. Extensive hepatic steatosis, also unchanged from the prior study. 4. Status post cholecystectomy.  No bile duct dilation. 5. Normal appendix visualized.   Electronically Signed   By: Lajean Manes M.D.   On: 02/10/2014 23:01      EKG Interpretation None      MDM   Final diagnoses:  Right upper quadrant abdominal pain  Chronic abdominal pain    Medications  HYDROmorphone (DILAUDID) injection 0.5 mg (0.5 mg Intravenous Given 02/10/14 2127)  ondansetron (ZOFRAN) injection 4 mg (4 mg Intravenous Given 02/10/14 2127)  sodium chloride 0.9 % bolus 1,000 mL (0 mLs Intravenous Stopped 02/10/14 2215)  iohexol (OMNIPAQUE) 300 MG/ML solution 25 mL (25 mLs Oral Contrast Given 02/10/14 2115)  iohexol (OMNIPAQUE) 300 MG/ML solution 100 mL (100 mLs Intravenous Contrast  Given 02/10/14 2224)  HYDROmorphone (DILAUDID) injection 0.5 mg (0.5 mg Intravenous Given 02/10/14 2321)    Filed Vitals:   02/10/14 1940 02/10/14 2130 02/10/14 2245 02/10/14 2330  BP: 158/108 154/95 137/80 142/79  Pulse: 89 92 92 100  Temp: 98.1 F (36.7 C)     TempSrc: Oral     Resp: 18     Height: 5\' 4"  (1.626 m)     Weight: 270 lb (122.471 kg)     SpO2: 97% 97% 96% 96%   This provider reviewed the patient's chart. Patient is been seen and assessed in ED setting countless times regarding abdominal pain. Patient was just seen on 01/23/2014 for right lower quadrant abdominal pain. Patient left AMA due to the staff being "mean" to her. CBC negative elevated leukocytosis. Hemoglobin 12.1, hematocrit 36.3. CMP noted mildly elevated AST of 79, ALT of 44 - when compared to previous labs this appears to be a chronic finding, chronic mild elevation of AST and ALT from previous labs also identified. Lipase negative elevation. Urine pregnancy negative. Urinalysis negative for hemoglobin, nitrites. Moderate leukocytes identified with a white blood cell count of 11-20, many squamous cells and many bacteria identified-patient reported that she is asymptomatic-urine culture pending. CT abdomen and pelvis with contrast noted findings-hepatosplenomegaly similar to prior exam. CT abdomen and pelvis with contrast unremarkable - negative acute abdominal processes noted. Labs reassuring. Definitive  etiology of right upper quadrant pain unknown-patient had cholecystectomy in 2010-2012. Negative episodes of emesis while in ED setting. Pain controlled with IV medications. Patient tolerated fluids by mouth without difficulty. Patient stable, afebrile. Patient not septic appearing. Discharged patient. Discharge patient with Zofran. Referred patient to PCP and gastrin trial G. Discussed with patient to rest and stay hydrated-discussed proper diet with patient. Discussed with patient to closely monitor symptoms and if  symptoms are to worsen or change to report back to the ED - strict return instructions given.  Patient agreed to plan of care, understood, all questions answered.   Jamse Mead, PA-C 02/11/14 0004  Charlesetta Shanks, MD 02/11/14 [phone]

## 2014-02-12 LAB — URINE CULTURE: Colony Count: 60000

## 2014-02-17 DIAGNOSIS — Z6841 Body Mass Index (BMI) 40.0 and over, adult: Secondary | ICD-10-CM | POA: Diagnosis not present

## 2014-02-17 DIAGNOSIS — Z888 Allergy status to other drugs, medicaments and biological substances status: Secondary | ICD-10-CM | POA: Diagnosis not present

## 2014-02-17 DIAGNOSIS — Z79899 Other long term (current) drug therapy: Secondary | ICD-10-CM | POA: Diagnosis not present

## 2014-02-17 DIAGNOSIS — N8320 Unspecified ovarian cysts: Secondary | ICD-10-CM | POA: Diagnosis not present

## 2014-02-17 DIAGNOSIS — E1140 Type 2 diabetes mellitus with diabetic neuropathy, unspecified: Secondary | ICD-10-CM | POA: Diagnosis not present

## 2014-02-17 DIAGNOSIS — K219 Gastro-esophageal reflux disease without esophagitis: Secondary | ICD-10-CM | POA: Diagnosis not present

## 2014-02-17 DIAGNOSIS — R109 Unspecified abdominal pain: Secondary | ICD-10-CM | POA: Diagnosis not present

## 2014-02-17 DIAGNOSIS — R162 Hepatomegaly with splenomegaly, not elsewhere classified: Secondary | ICD-10-CM | POA: Diagnosis not present

## 2014-02-17 DIAGNOSIS — E669 Obesity, unspecified: Secondary | ICD-10-CM | POA: Diagnosis not present

## 2014-02-17 DIAGNOSIS — N390 Urinary tract infection, site not specified: Secondary | ICD-10-CM | POA: Diagnosis not present

## 2014-02-17 DIAGNOSIS — Z885 Allergy status to narcotic agent status: Secondary | ICD-10-CM | POA: Diagnosis not present

## 2014-02-17 DIAGNOSIS — I10 Essential (primary) hypertension: Secondary | ICD-10-CM | POA: Diagnosis not present

## 2014-02-20 DIAGNOSIS — E669 Obesity, unspecified: Secondary | ICD-10-CM | POA: Diagnosis not present

## 2014-02-20 DIAGNOSIS — Z888 Allergy status to other drugs, medicaments and biological substances status: Secondary | ICD-10-CM | POA: Diagnosis not present

## 2014-02-20 DIAGNOSIS — R197 Diarrhea, unspecified: Secondary | ICD-10-CM | POA: Diagnosis not present

## 2014-02-20 DIAGNOSIS — I10 Essential (primary) hypertension: Secondary | ICD-10-CM | POA: Diagnosis not present

## 2014-02-20 DIAGNOSIS — Z6841 Body Mass Index (BMI) 40.0 and over, adult: Secondary | ICD-10-CM | POA: Diagnosis not present

## 2014-02-20 DIAGNOSIS — Z79899 Other long term (current) drug therapy: Secondary | ICD-10-CM | POA: Diagnosis not present

## 2014-02-20 DIAGNOSIS — K219 Gastro-esophageal reflux disease without esophagitis: Secondary | ICD-10-CM | POA: Diagnosis not present

## 2014-02-20 DIAGNOSIS — Z885 Allergy status to narcotic agent status: Secondary | ICD-10-CM | POA: Diagnosis not present

## 2014-02-20 DIAGNOSIS — E119 Type 2 diabetes mellitus without complications: Secondary | ICD-10-CM | POA: Diagnosis not present

## 2014-02-20 DIAGNOSIS — N8320 Unspecified ovarian cysts: Secondary | ICD-10-CM | POA: Diagnosis not present

## 2014-02-20 DIAGNOSIS — R161 Splenomegaly, not elsewhere classified: Secondary | ICD-10-CM | POA: Diagnosis not present

## 2014-02-28 DIAGNOSIS — F319 Bipolar disorder, unspecified: Secondary | ICD-10-CM | POA: Diagnosis not present

## 2014-02-28 DIAGNOSIS — R1011 Right upper quadrant pain: Secondary | ICD-10-CM | POA: Diagnosis not present

## 2014-02-28 DIAGNOSIS — R161 Splenomegaly, not elsewhere classified: Secondary | ICD-10-CM | POA: Diagnosis not present

## 2014-02-28 DIAGNOSIS — F419 Anxiety disorder, unspecified: Secondary | ICD-10-CM | POA: Diagnosis not present

## 2014-02-28 DIAGNOSIS — N8320 Unspecified ovarian cysts: Secondary | ICD-10-CM | POA: Diagnosis not present

## 2014-02-28 DIAGNOSIS — E119 Type 2 diabetes mellitus without complications: Secondary | ICD-10-CM | POA: Diagnosis not present

## 2014-02-28 DIAGNOSIS — F329 Major depressive disorder, single episode, unspecified: Secondary | ICD-10-CM | POA: Diagnosis not present

## 2014-02-28 DIAGNOSIS — F209 Schizophrenia, unspecified: Secondary | ICD-10-CM | POA: Diagnosis not present

## 2014-02-28 DIAGNOSIS — R05 Cough: Secondary | ICD-10-CM | POA: Diagnosis not present

## 2014-02-28 DIAGNOSIS — K219 Gastro-esophageal reflux disease without esophagitis: Secondary | ICD-10-CM | POA: Diagnosis not present

## 2014-02-28 DIAGNOSIS — Z79899 Other long term (current) drug therapy: Secondary | ICD-10-CM | POA: Diagnosis not present

## 2014-02-28 DIAGNOSIS — R110 Nausea: Secondary | ICD-10-CM | POA: Diagnosis not present

## 2014-02-28 DIAGNOSIS — Z9049 Acquired absence of other specified parts of digestive tract: Secondary | ICD-10-CM | POA: Diagnosis not present

## 2014-02-28 DIAGNOSIS — K760 Fatty (change of) liver, not elsewhere classified: Secondary | ICD-10-CM | POA: Diagnosis not present

## 2014-03-01 DIAGNOSIS — R1011 Right upper quadrant pain: Secondary | ICD-10-CM | POA: Diagnosis not present

## 2014-03-01 DIAGNOSIS — R161 Splenomegaly, not elsewhere classified: Secondary | ICD-10-CM | POA: Diagnosis not present

## 2014-03-01 DIAGNOSIS — K760 Fatty (change of) liver, not elsewhere classified: Secondary | ICD-10-CM | POA: Diagnosis not present

## 2014-03-01 DIAGNOSIS — N8320 Unspecified ovarian cysts: Secondary | ICD-10-CM | POA: Diagnosis not present

## 2014-03-02 ENCOUNTER — Emergency Department (HOSPITAL_COMMUNITY)
Admission: EM | Admit: 2014-03-02 | Discharge: 2014-03-03 | Disposition: A | Discharge status: Home / Self Care | Payer: Medicare Other | Attending: Emergency Medicine | Admitting: Emergency Medicine

## 2014-03-02 ENCOUNTER — Encounter (HOSPITAL_COMMUNITY): Payer: Self-pay | Admitting: Emergency Medicine

## 2014-03-02 ENCOUNTER — Emergency Department (HOSPITAL_COMMUNITY): Payer: Medicare Other

## 2014-03-02 ENCOUNTER — Emergency Department (HOSPITAL_COMMUNITY)
Admission: EM | Admit: 2014-03-02 | Discharge: 2014-03-02 | Discharge status: Against Medical Advice | Payer: Medicare Other | Attending: Emergency Medicine | Admitting: Emergency Medicine

## 2014-03-02 DIAGNOSIS — F209 Schizophrenia, unspecified: Secondary | ICD-10-CM

## 2014-03-02 DIAGNOSIS — Z85828 Personal history of other malignant neoplasm of skin: Secondary | ICD-10-CM | POA: Diagnosis not present

## 2014-03-02 DIAGNOSIS — R0789 Other chest pain: Secondary | ICD-10-CM | POA: Diagnosis not present

## 2014-03-02 DIAGNOSIS — R0602 Shortness of breath: Secondary | ICD-10-CM | POA: Diagnosis not present

## 2014-03-02 DIAGNOSIS — R42 Dizziness and giddiness: Secondary | ICD-10-CM | POA: Diagnosis not present

## 2014-03-02 DIAGNOSIS — I10 Essential (primary) hypertension: Secondary | ICD-10-CM | POA: Insufficient documentation

## 2014-03-02 DIAGNOSIS — Z79899 Other long term (current) drug therapy: Secondary | ICD-10-CM | POA: Insufficient documentation

## 2014-03-02 DIAGNOSIS — R45851 Suicidal ideations: Secondary | ICD-10-CM | POA: Diagnosis not present

## 2014-03-02 DIAGNOSIS — R079 Chest pain, unspecified: Secondary | ICD-10-CM | POA: Diagnosis not present

## 2014-03-02 DIAGNOSIS — Z792 Long term (current) use of antibiotics: Secondary | ICD-10-CM | POA: Diagnosis not present

## 2014-03-02 DIAGNOSIS — F319 Bipolar disorder, unspecified: Secondary | ICD-10-CM | POA: Diagnosis not present

## 2014-03-02 DIAGNOSIS — E119 Type 2 diabetes mellitus without complications: Secondary | ICD-10-CM | POA: Insufficient documentation

## 2014-03-02 DIAGNOSIS — F315 Bipolar disorder, current episode depressed, severe, with psychotic features: Secondary | ICD-10-CM | POA: Diagnosis not present

## 2014-03-02 DIAGNOSIS — E669 Obesity, unspecified: Secondary | ICD-10-CM | POA: Diagnosis not present

## 2014-03-02 DIAGNOSIS — R1010 Upper abdominal pain, unspecified: Secondary | ICD-10-CM | POA: Diagnosis not present

## 2014-03-02 DIAGNOSIS — R1013 Epigastric pain: Secondary | ICD-10-CM | POA: Diagnosis not present

## 2014-03-02 DIAGNOSIS — R918 Other nonspecific abnormal finding of lung field: Secondary | ICD-10-CM | POA: Diagnosis not present

## 2014-03-02 DIAGNOSIS — F4312 Post-traumatic stress disorder, chronic: Secondary | ICD-10-CM | POA: Diagnosis not present

## 2014-03-02 DIAGNOSIS — F419 Anxiety disorder, unspecified: Secondary | ICD-10-CM | POA: Diagnosis not present

## 2014-03-02 DIAGNOSIS — Z62810 Personal history of physical and sexual abuse in childhood: Secondary | ICD-10-CM | POA: Diagnosis not present

## 2014-03-02 DIAGNOSIS — R110 Nausea: Secondary | ICD-10-CM | POA: Diagnosis not present

## 2014-03-02 LAB — CBC
HCT: 38.3 % (ref 36.0–46.0)
HEMOGLOBIN: 12 g/dL (ref 12.0–15.0)
MCH: 26.8 pg (ref 26.0–34.0)
MCHC: 31.3 g/dL (ref 30.0–36.0)
MCV: 85.5 fL (ref 78.0–100.0)
Platelets: 171 10*3/uL (ref 150–400)
RBC: 4.48 MIL/uL (ref 3.87–5.11)
RDW: 15.1 % (ref 11.5–15.5)
WBC: 5.2 10*3/uL (ref 4.0–10.5)

## 2014-03-02 LAB — CBC WITH DIFFERENTIAL/PLATELET
BASOS ABS: 0 10*3/uL (ref 0.0–0.1)
Basophils Relative: 0 % (ref 0–1)
EOS ABS: 0.2 10*3/uL (ref 0.0–0.7)
Eosinophils Relative: 3 % (ref 0–5)
HCT: 36.1 % (ref 36.0–46.0)
Hemoglobin: 12 g/dL (ref 12.0–15.0)
LYMPHS ABS: 1.2 10*3/uL (ref 0.7–4.0)
LYMPHS PCT: 18 % (ref 12–46)
MCH: 27.4 pg (ref 26.0–34.0)
MCHC: 33.2 g/dL (ref 30.0–36.0)
MCV: 82.4 fL (ref 78.0–100.0)
MONO ABS: 0.5 10*3/uL (ref 0.1–1.0)
Monocytes Relative: 7 % (ref 3–12)
NEUTROS ABS: 5 10*3/uL (ref 1.7–7.7)
Neutrophils Relative %: 72 % (ref 43–77)
Platelets: 173 10*3/uL (ref 150–400)
RBC: 4.38 MIL/uL (ref 3.87–5.11)
RDW: 15 % (ref 11.5–15.5)
WBC: 6.9 10*3/uL (ref 4.0–10.5)

## 2014-03-02 LAB — BASIC METABOLIC PANEL
ANION GAP: 10 (ref 5–15)
BUN: 7 mg/dL (ref 6–23)
CO2: 23 mmol/L (ref 19–32)
Calcium: 8.8 mg/dL (ref 8.4–10.5)
Chloride: 103 mmol/L (ref 96–112)
Creatinine, Ser: 0.58 mg/dL (ref 0.50–1.10)
GFR calc non Af Amer: >90 mL/min (ref >90)
Glucose, Bld: 235 mg/dL — ABNORMAL HIGH (ref 70–99)
Potassium: 4.1 mmol/L (ref 3.5–5.1)
Sodium: 136 mmol/L (ref 135–145)

## 2014-03-02 LAB — BRAIN NATRIURETIC PEPTIDE: B Natriuretic Peptide: 18.9 pg/mL (ref 0.0–100.0)

## 2014-03-02 LAB — I-STAT TROPONIN, ED: Troponin i, poc: 0 ng/mL (ref 0.00–0.08)

## 2014-03-02 MED ORDER — GI COCKTAIL ~~LOC~~
30.0000 mL | Freq: Once | ORAL | Status: AC
  Administered 2014-03-02: 30 mL via ORAL
  Filled 2014-03-02: qty 30

## 2014-03-02 NOTE — ED Notes (Addendum)
Pt changed into paper scrubs- security called to wand pt again.

## 2014-03-02 NOTE — ED Provider Notes (Signed)
CSN: [id number]     Arrival date & time 03/02/14  2154 History   First MD Initiated Contact with Patient 03/02/14 2256     Chief Complaint  Patient presents with  . Chest Pain  . Suicidal     (Consider location/radiation/quality/duration/timing/severity/associated sxs/prior Treatment) HPI   Patient presents with two complaints.  States she has had constant left sided chest pain x 2 days.  Pain is described as pressure that is constant with occasional worsening, it radiates into her left arm.  She has associated nausea and upper abdominal pain, and a bad taste in her mouth.  Has SOB only when the pain is very intense.  Denies fevers, cough, lightheadedness/dizziness, vomiting.   Pt also has hx schizophrenia, bipolar disorder, suicide attempts, states she hears and sees a man called [name] (in the room with [place] while I am speaking with her) who is telling her to kill herself and also telling her to leave the hospital.  She states she does intend to kill herself and her plan is to cut her wrists.  Denies any HI or being told to hurt anyone else.      Past Medical History  Diagnosis Date  . Polycystic ovarian syndrome 07/01/2011    Patient report  . Anxiety   . Depression   . Hypertension   . Obesity   . Schizophrenia   . Bipolar 1 disorder   . Obesity   . Cancer of abdominal wall   . Rhabdosarcoma   . Diabetes mellitus without complication    Past Surgical History  Procedure Laterality Date  . Cholecystectomy    . Varicose vein surgery    . Ovarian cyst excision    . Hernia repair     Family History  Problem Relation Age of Onset  . Coronary artery disease Maternal Grandmother   . Diabetes type II Maternal Grandmother   . Cancer Maternal Grandmother   . Hypertension Mother   . Hypertension Father    History  Substance Use Topics  . Smoking status: Never Smoker   . Smokeless tobacco: Never Used  . Alcohol Use: No   OB History    Gravida Para Term Preterm AB TAB SAB  Ectopic Multiple Living   0              Review of Systems  All other systems reviewed and are negative.     Allergies  Fish-derived products; Geodon; Haldol; Compazine; Morphine and related; Toradol; and Buprenorphine hcl  Home Medications   Prior to Admission medications   Medication Sig Start Date End Date Taking? Authorizing Provider  busPIRone (BUSPAR) 15 MG tablet Take 15 mg by mouth 3 (three) times daily.    Historical Provider, MD  cephALEXin (KEFLEX) 500 MG capsule Take 1 capsule (500 mg total) by mouth 3 (three) times daily. Patient not taking: Reported on 01/23/2014 12/28/13   Margarita Mail, PA-C  gabapentin (NEURONTIN) 400 MG capsule Take 400 mg by mouth 3 (three) times daily.    Historical Provider, MD  Melatonin 3 MG TABS Take 3 mg by mouth daily as needed (for sleep).    Historical Provider, MD  metFORMIN (GLUCOPHAGE-XR) 500 MG 24 hr tablet Take 500 mg by mouth daily.    Historical Provider, MD  metoprolol succinate (TOPROL-XL) 25 MG 24 hr tablet Take 25 mg by mouth daily.    Historical Provider, MD  ondansetron (ZOFRAN ODT) 4 MG disintegrating tablet 4mg  ODT q4 hours prn nausea/vomit 02/10/14   Marissa  Sciacca, PA-C  ondansetron (ZOFRAN) 4 MG tablet Take 1 tablet (4 mg total) by mouth every 8 (eight) hours as needed for nausea or vomiting. 12/28/13   Margarita Mail, PA-C  pantoprazole (PROTONIX) 40 MG tablet Take 40 mg by mouth at bedtime.    Historical Provider, MD  phenazopyridine (PYRIDIUM) 200 MG tablet Take 1 tablet (200 mg total) by mouth 3 (three) times daily as needed (pain with urination). Patient not taking: Reported on 01/23/2014 12/28/13   Margarita Mail, PA-C  QUEtiapine (SEROQUEL XR) 400 MG 24 hr tablet Take 800 mg by mouth at bedtime.    Historical Provider, MD  sertraline (ZOLOFT) 50 MG tablet Take 150 mg by mouth at bedtime.     Historical Provider, MD  traMADol (ULTRAM) 50 MG tablet Take 1 tablet (50 mg total) by mouth every 6 (six) hours as needed. Patient  not taking: Reported on 01/23/2014 12/28/13   Margarita Mail, PA-C  zolpidem (AMBIEN) 10 MG tablet Take 10 mg by mouth at bedtime as needed for sleep.    Historical Provider, MD   BP 159/98 mmHg  Pulse 102  Temp(Src) 97.6 F (36.4 C)  Resp 17  Ht 5\' 4"  (1.626 m)  Wt 270 lb (122.471 kg)  BMI 46.32 kg/m2  SpO2 97%  LMP 01/23/2014 Physical Exam  Constitutional: She appears well-developed and well-nourished. No distress.  HENT:  Head: Normocephalic and atraumatic.  Neck: Neck supple.  Cardiovascular: Normal rate and regular rhythm.   Pulmonary/Chest: Effort normal and breath sounds normal. No respiratory distress. She has no wheezes. She has no rales. She exhibits tenderness.  Abdominal: Soft. She exhibits no distension. There is tenderness in the epigastric area. There is no rebound and no guarding.  Musculoskeletal: She exhibits no edema.  Neurological: She is alert.  Skin: She is not diaphoretic.  Psychiatric: She expresses suicidal ideation. She expresses suicidal plans.  Nursing note and vitals reviewed.   ED Course  Procedures (including critical care time) Labs Review Labs Reviewed  COMPREHENSIVE METABOLIC PANEL - Abnormal; Notable for the following:    Glucose, Bld 166 (*)    AST 59 (*)    ALT 37 (*)    Total Bilirubin 0.2 (*)    All other components within normal limits  TROPONIN I  CBC WITH DIFFERENTIAL/PLATELET  LIPASE, BLOOD  ACETAMINOPHEN LEVEL  SALICYLATE LEVEL  D-DIMER, QUANTITATIVE    Imaging Review Dg Chest 2 View  03/03/2014   CLINICAL DATA:  Acute onset of generalized chest pain and shortness of breath. Initial encounter.  EXAM: CHEST  2 VIEW  COMPARISON:  Chest radiograph performed 01/20/2014  FINDINGS: The lungs are well-aerated. Minimal left basilar opacity may reflect atelectasis or possibly mild pneumonia. There is no evidence of pleural effusion or pneumothorax.  The heart is borderline enlarged. No acute osseous abnormalities are seen.  IMPRESSION: 1.  Minimal left basilar opacity may reflect atelectasis or possibly mild pneumonia, depending on the patient's symptoms. 2. Borderline cardiomegaly.   Electronically Signed   By: Garald Balding M.D.   On: 03/03/2014 00:19     EKG Interpretation None        Date: 03/03/2014  Rate: 102  Rhythm: sinus tachycardia  QRS Axis: left  Intervals: QT prolonged  ST/T Wave abnormalities: nonspecific ST/T changes  Conduction Disutrbances:none  Narrative Interpretation:   Old EKG Reviewed: none available     EKG reviewed by Dr Ralene Bathe who states it is unchanged from prior.    MDM   Final diagnoses:  Chest pain, unspecified chest pain type  Schizophrenia, unspecified type  Suicidal ideation   Afebrile nontoxic patient with two days of constant chest pain with radiation into left arm, upper abdominal pain, nausea, bad taste in her mouth.  She has had no fever or cough, no SOB except when spikes of pain occur.  CXR shows minimal left basilar opacity that is likely atelectasis, though to possibly be a mild pneumonia but clinically patient does not have pneumonia.  EKG unchanged.  Troponin negative after two days of constant pain. Pt medicated for pain. Patient signed out to Charlann Lange, PA-C, at change of shift pending med clearance labs and d-dimer.  Pt has been IVC'd and will remain in the ED under psych hold pending medical clearance.     Clayton Bibles, PA-C 03/03/14 0140  Quintella Reichert, MD 03/03/14 [phone]

## 2014-03-02 NOTE — ED Notes (Signed)
Pt c/o central chest pain that started last night that radiates to her left arm. Pt describes it as elephant sitting on her chest. Pt states that she has SHOB, nausea and dizziness.

## 2014-03-02 NOTE — ED Notes (Signed)
Pt called multiple times by registration and staff with no response.

## 2014-03-02 NOTE — ED Notes (Addendum)
Pt presents for evaluation of chest pain that started 2 days ago- admits to SOB- pt left AMA from [facility] due to wait time- lab results in [facility].  Pt reports feeling suicidial now, reports hearing voices that are telling her to harm herself- denies plan, denies HI.

## 2014-03-02 NOTE — ED Notes (Addendum)
Pt refused to change into paper scrubs.  Belongings at desk, pt wanded by security.

## 2014-03-03 ENCOUNTER — Emergency Department (HOSPITAL_COMMUNITY): Payer: Medicare Other

## 2014-03-03 DIAGNOSIS — Z62810 Personal history of physical and sexual abuse in childhood: Secondary | ICD-10-CM | POA: Diagnosis not present

## 2014-03-03 DIAGNOSIS — R918 Other nonspecific abnormal finding of lung field: Secondary | ICD-10-CM | POA: Diagnosis not present

## 2014-03-03 DIAGNOSIS — R45851 Suicidal ideations: Secondary | ICD-10-CM | POA: Diagnosis not present

## 2014-03-03 DIAGNOSIS — F4312 Post-traumatic stress disorder, chronic: Secondary | ICD-10-CM | POA: Diagnosis not present

## 2014-03-03 DIAGNOSIS — F315 Bipolar disorder, current episode depressed, severe, with psychotic features: Secondary | ICD-10-CM | POA: Diagnosis not present

## 2014-03-03 DIAGNOSIS — F209 Schizophrenia, unspecified: Secondary | ICD-10-CM | POA: Diagnosis not present

## 2014-03-03 LAB — COMPREHENSIVE METABOLIC PANEL
ALK PHOS: 114 U/L (ref 39–117)
ALT: 37 U/L — AB (ref 0–35)
AST: 59 U/L — ABNORMAL HIGH (ref 0–37)
Albumin: 3.8 g/dL (ref 3.5–5.2)
Anion gap: 8 (ref 5–15)
BILIRUBIN TOTAL: 0.2 mg/dL — AB (ref 0.3–1.2)
BUN: 7 mg/dL (ref 6–23)
CO2: 26 mmol/L (ref 19–32)
Calcium: 8.7 mg/dL (ref 8.4–10.5)
Chloride: 101 mmol/L (ref 96–112)
Creatinine, Ser: 0.57 mg/dL (ref 0.50–1.10)
GFR calc non Af Amer: >90 mL/min (ref >90)
GLUCOSE: 166 mg/dL — AB (ref 70–99)
Potassium: 3.8 mmol/L (ref 3.5–5.1)
Sodium: 135 mmol/L (ref 135–145)
Total Protein: 7.2 g/dL (ref 6.0–8.3)

## 2014-03-03 LAB — SALICYLATE LEVEL: Salicylate Lvl: <4 mg/dL (ref 2.8–20.0)

## 2014-03-03 LAB — LIPASE, BLOOD: LIPASE: 34 U/L (ref 11–59)

## 2014-03-03 LAB — ACETAMINOPHEN LEVEL

## 2014-03-03 LAB — TROPONIN I

## 2014-03-03 LAB — D-DIMER, QUANTITATIVE: D-Dimer, Quant: <0.27 ug/mL-FEU (ref 0.00–0.48)

## 2014-03-03 MED ORDER — NICOTINE 21 MG/24HR TD PT24: 21.0000 mg | MEDICATED_PATCH | Freq: Every day | TRANSDERMAL | Status: DC

## 2014-03-03 MED ORDER — QUETIAPINE FUMARATE ER 300 MG PO TB24
800.0000 mg | ORAL_TABLET | Freq: Every day | ORAL | Status: DC
  Administered 2014-03-03: 800 mg via ORAL
  Filled 2014-03-03: qty 1
  Filled 2014-03-03: qty 2

## 2014-03-03 MED ORDER — ONDANSETRON 4 MG PO TBDP: 4.0000 mg | ORAL_TABLET | Freq: Three times a day (TID) | ORAL | Status: DC | PRN

## 2014-03-03 MED ORDER — ACETAMINOPHEN 325 MG PO TABS: 650.0000 mg | ORAL_TABLET | ORAL | Status: DC | PRN

## 2014-03-03 MED ORDER — BUSPIRONE HCL 10 MG PO TABS
15.0000 mg | ORAL_TABLET | Freq: Three times a day (TID) | ORAL | Status: DC
Start: 2014-03-03 — End: 2014-03-03
  Administered 2014-03-03: 15 mg via ORAL
  Filled 2014-03-03: qty 2

## 2014-03-03 MED ORDER — SERTRALINE HCL 50 MG PO TABS
150.0000 mg | ORAL_TABLET | Freq: Every day | ORAL | Status: DC
  Administered 2014-03-03: 150 mg via ORAL
  Filled 2014-03-03: qty 3

## 2014-03-03 MED ORDER — LORAZEPAM 1 MG PO TABS
1.0000 mg | ORAL_TABLET | Freq: Once | ORAL | Status: AC
  Administered 2014-03-03: 1 mg via ORAL
  Filled 2014-03-03: qty 1

## 2014-03-03 MED ORDER — LORAZEPAM 1 MG PO TABS
1.0000 mg | ORAL_TABLET | Freq: Three times a day (TID) | ORAL | Status: DC | PRN
  Administered 2014-03-03: 1 mg via ORAL
  Filled 2014-03-03: qty 1

## 2014-03-03 MED ORDER — SERTRALINE HCL 100 MG PO TABS: 200.0000 mg | ORAL_TABLET | Freq: Every day | ORAL | Status: DC

## 2014-03-03 MED ORDER — ZOLPIDEM TARTRATE 5 MG PO TABS
20.0000 mg | ORAL_TABLET | Freq: Every day | ORAL | Status: DC
  Administered 2014-03-03: 20 mg via ORAL
  Filled 2014-03-03: qty 4

## 2014-03-03 MED ORDER — CLONAZEPAM 0.5 MG PO TABS: 0.5000 mg | ORAL_TABLET | Freq: Two times a day (BID) | ORAL | Status: DC

## 2014-03-03 MED ORDER — HYDROCODONE-ACETAMINOPHEN 5-325 MG PO TABS
1.0000 | ORAL_TABLET | Freq: Once | ORAL | Status: AC
  Administered 2014-03-03: 1 via ORAL
  Filled 2014-03-03: qty 1

## 2014-03-03 MED ORDER — METFORMIN HCL ER 500 MG PO TB24
500.0000 mg | ORAL_TABLET | Freq: Every day | ORAL | Status: DC
  Filled 2014-03-03: qty 1

## 2014-03-03 MED ORDER — PANTOPRAZOLE SODIUM 40 MG PO TBEC
40.0000 mg | DELAYED_RELEASE_TABLET | Freq: Every day | ORAL | Status: DC
  Administered 2014-03-03: 40 mg via ORAL
  Filled 2014-03-03: qty 1

## 2014-03-03 MED ORDER — GABAPENTIN 400 MG PO CAPS
400.0000 mg | ORAL_CAPSULE | Freq: Three times a day (TID) | ORAL | Status: DC
  Administered 2014-03-03: 400 mg via ORAL
  Filled 2014-03-03: qty 1

## 2014-03-03 MED ORDER — MELATONIN 3 MG PO TABS
3.0000 mg | ORAL_TABLET | Freq: Every evening | ORAL | Status: DC | PRN
  Filled 2014-03-03: qty 1

## 2014-03-03 MED ORDER — METOPROLOL SUCCINATE ER 25 MG PO TB24
25.0000 mg | ORAL_TABLET | Freq: Every day | ORAL | Status: DC
  Administered 2014-03-03: 25 mg via ORAL
  Filled 2014-03-03: qty 1

## 2014-03-03 MED ORDER — ONDANSETRON HCL 4 MG PO TABS: 4.0000 mg | ORAL_TABLET | Freq: Three times a day (TID) | ORAL | Status: DC | PRN

## 2014-03-03 MED ORDER — ZOLPIDEM TARTRATE 5 MG PO TABS: 5.0000 mg | ORAL_TABLET | Freq: Every evening | ORAL | Status: DC | PRN

## 2014-03-03 MED ORDER — ALUM & MAG HYDROXIDE-SIMETH 200-200-20 MG/5ML PO SUSP: 30.0000 mL | ORAL | Status: DC | PRN

## 2014-03-03 NOTE — ED Provider Notes (Signed)
IVC - visual, hallucinations. Seeing a man she calls [name] who gives commands to kill herself. Suicidal plan is to cut wrists. She also c/o Chest pain - d dimer pending (left sided, into arm, N, upper abd pain, "bad taste in mouth") - 2 days, neg trop CXR - atx vs mild pna. Does not have clinical symptoms of pneumonia.  Pending labs: Under psych hold with TTS consult pending  Per TTS, patient will be evaluated by psychiatrist in a.m. She has a longstanding history of SI with attempts, placement at multiple facilities, none of which she feels helps her. Psychiatry to help determine appropriate disposition.   Dewaine Oats, PA-C 03/03/14 [id number]  Quintella Reichert, MD 03/05/14 [phone]

## 2014-03-03 NOTE — ED Notes (Signed)
Dr Johnna Acosta rescinded pt's IVC papers.
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
## 2014-03-03 NOTE — Discharge Instructions (Signed)
Increase zoloft to 200 mg daily.   Add klonopin 0.5 mg twice daily.   Follow up with your psychiatrist.   Return to ER if you have thoughts of harming yourself or others, overdose.

## 2014-03-03 NOTE — ED Notes (Signed)
PT REQUESTED FOR SISTER TO BE CALLED SO CAN COME PICK HER UP. [phone]
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
## 2014-03-03 NOTE — Progress Notes (Signed)
LCSW was made aware that she is being discharged from [facility] ED. Patient was seen regarding outpatient services. Patient follows at [facility].  LCSW completed referral for [facility] and made patient appointment as she reports due to past snow in January she missed her appointment.   Appointment completed. Patient sister to pick her up from hospital.  No barriers to DC.  Lane Hacker, MSW Clinical Social Work: Emergency Room [phone]

## 2014-03-03 NOTE — ED Notes (Signed)
Pt to be reassessed by psychiatrist in AM to discuss placement options and medications. Pt has had IVC papers served.

## 2014-03-03 NOTE — ED Notes (Signed)
Pt sleeping will administer 10:00 meds when pt awakes.

## 2014-03-03 NOTE — ED Notes (Addendum)
Pt calling out, stating "I want a snack and my medication, I don't care that it's not snack time". Pt is restless in bed, trying to get up and walk around. Pt also stating that she wants to go home.

## 2014-03-03 NOTE — ED Provider Notes (Signed)
  Physical Exam  BP 137/85 mmHg  Pulse 100  Temp(Src) 97.6 F (36.4 C)  Resp 20  Ht 5\' 4"  (1.626 m)  Wt 270 lb (122.471 kg)  BMI 46.32 kg/m2  SpO2 96%  LMP 02/19/2014  Physical Exam  ED Course  Procedures  MDM Dr. Lamar Benes saw patient. Rescinded IVC. Wants me to increase zoloft to 200 mg daily and add klonopin 0.5 mg BID.   Wandra Arthurs, MD 03/03/14 [phone]

## 2014-03-07 DIAGNOSIS — R0602 Shortness of breath: Secondary | ICD-10-CM | POA: Diagnosis not present

## 2014-03-07 DIAGNOSIS — F209 Schizophrenia, unspecified: Secondary | ICD-10-CM | POA: Diagnosis not present

## 2014-03-07 DIAGNOSIS — R9431 Abnormal electrocardiogram [ECG] [EKG]: Secondary | ICD-10-CM | POA: Diagnosis not present

## 2014-03-07 DIAGNOSIS — K219 Gastro-esophageal reflux disease without esophagitis: Secondary | ICD-10-CM | POA: Diagnosis not present

## 2014-03-07 DIAGNOSIS — G629 Polyneuropathy, unspecified: Secondary | ICD-10-CM | POA: Diagnosis not present

## 2014-03-07 DIAGNOSIS — E119 Type 2 diabetes mellitus without complications: Secondary | ICD-10-CM | POA: Diagnosis not present

## 2014-03-07 DIAGNOSIS — F419 Anxiety disorder, unspecified: Secondary | ICD-10-CM | POA: Diagnosis not present

## 2014-03-07 DIAGNOSIS — I10 Essential (primary) hypertension: Secondary | ICD-10-CM | POA: Diagnosis not present

## 2014-03-07 DIAGNOSIS — F319 Bipolar disorder, unspecified: Secondary | ICD-10-CM | POA: Diagnosis not present

## 2014-03-07 DIAGNOSIS — M7989 Other specified soft tissue disorders: Secondary | ICD-10-CM | POA: Diagnosis not present

## 2014-03-23 DIAGNOSIS — Z6841 Body Mass Index (BMI) 40.0 and over, adult: Secondary | ICD-10-CM | POA: Diagnosis not present

## 2014-03-23 DIAGNOSIS — I10 Essential (primary) hypertension: Secondary | ICD-10-CM | POA: Diagnosis not present

## 2014-03-23 DIAGNOSIS — J069 Acute upper respiratory infection, unspecified: Secondary | ICD-10-CM | POA: Diagnosis not present

## 2014-03-23 DIAGNOSIS — E119 Type 2 diabetes mellitus without complications: Secondary | ICD-10-CM | POA: Diagnosis not present

## 2014-03-23 DIAGNOSIS — K219 Gastro-esophageal reflux disease without esophagitis: Secondary | ICD-10-CM | POA: Diagnosis not present

## 2014-03-23 DIAGNOSIS — Z888 Allergy status to other drugs, medicaments and biological substances status: Secondary | ICD-10-CM | POA: Diagnosis not present

## 2014-03-23 DIAGNOSIS — E669 Obesity, unspecified: Secondary | ICD-10-CM | POA: Diagnosis not present

## 2014-03-23 DIAGNOSIS — Z79899 Other long term (current) drug therapy: Secondary | ICD-10-CM | POA: Diagnosis not present

## 2014-03-23 DIAGNOSIS — R32 Unspecified urinary incontinence: Secondary | ICD-10-CM | POA: Diagnosis not present

## 2014-03-23 DIAGNOSIS — I8390 Asymptomatic varicose veins of unspecified lower extremity: Secondary | ICD-10-CM | POA: Diagnosis not present

## 2014-03-23 DIAGNOSIS — G629 Polyneuropathy, unspecified: Secondary | ICD-10-CM | POA: Diagnosis not present

## 2014-03-23 DIAGNOSIS — Z886 Allergy status to analgesic agent status: Secondary | ICD-10-CM | POA: Diagnosis not present

## 2014-03-23 DIAGNOSIS — R05 Cough: Secondary | ICD-10-CM | POA: Diagnosis not present

## 2014-03-27 ENCOUNTER — Encounter (HOSPITAL_COMMUNITY): Payer: Self-pay | Admitting: Emergency Medicine

## 2014-03-27 ENCOUNTER — Emergency Department (HOSPITAL_COMMUNITY): Payer: Medicare Other

## 2014-03-27 DIAGNOSIS — E282 Polycystic ovarian syndrome: Secondary | ICD-10-CM | POA: Insufficient documentation

## 2014-03-27 DIAGNOSIS — F419 Anxiety disorder, unspecified: Secondary | ICD-10-CM | POA: Diagnosis not present

## 2014-03-27 DIAGNOSIS — I10 Essential (primary) hypertension: Secondary | ICD-10-CM | POA: Diagnosis not present

## 2014-03-27 DIAGNOSIS — Z85828 Personal history of other malignant neoplasm of skin: Secondary | ICD-10-CM | POA: Insufficient documentation

## 2014-03-27 DIAGNOSIS — R062 Wheezing: Secondary | ICD-10-CM | POA: Insufficient documentation

## 2014-03-27 DIAGNOSIS — Z8701 Personal history of pneumonia (recurrent): Secondary | ICD-10-CM | POA: Diagnosis not present

## 2014-03-27 DIAGNOSIS — F329 Major depressive disorder, single episode, unspecified: Secondary | ICD-10-CM | POA: Insufficient documentation

## 2014-03-27 DIAGNOSIS — R05 Cough: Secondary | ICD-10-CM | POA: Diagnosis not present

## 2014-03-27 DIAGNOSIS — R112 Nausea with vomiting, unspecified: Secondary | ICD-10-CM | POA: Diagnosis not present

## 2014-03-27 DIAGNOSIS — Z79899 Other long term (current) drug therapy: Secondary | ICD-10-CM | POA: Insufficient documentation

## 2014-03-27 DIAGNOSIS — E6609 Other obesity due to excess calories: Secondary | ICD-10-CM | POA: Diagnosis not present

## 2014-03-27 DIAGNOSIS — R000 Tachycardia, unspecified: Secondary | ICD-10-CM | POA: Diagnosis not present

## 2014-03-27 DIAGNOSIS — R509 Fever, unspecified: Secondary | ICD-10-CM | POA: Diagnosis not present

## 2014-03-27 DIAGNOSIS — Z8589 Personal history of malignant neoplasm of other organs and systems: Secondary | ICD-10-CM | POA: Insufficient documentation

## 2014-03-27 DIAGNOSIS — E119 Type 2 diabetes mellitus without complications: Secondary | ICD-10-CM | POA: Insufficient documentation

## 2014-03-27 DIAGNOSIS — R0602 Shortness of breath: Secondary | ICD-10-CM | POA: Diagnosis not present

## 2014-03-27 LAB — CBC WITH DIFFERENTIAL/PLATELET
Basophils Absolute: 0 10*3/uL (ref 0.0–0.1)
Basophils Relative: 0 % (ref 0–1)
Eosinophils Absolute: 0.3 10*3/uL (ref 0.0–0.7)
Eosinophils Relative: 3 % (ref 0–5)
HCT: 40 % (ref 36.0–46.0)
HEMOGLOBIN: 13.3 g/dL (ref 12.0–15.0)
LYMPHS ABS: 0.6 10*3/uL — AB (ref 0.7–4.0)
LYMPHS PCT: 7 % — AB (ref 12–46)
MCH: 27.7 pg (ref 26.0–34.0)
MCHC: 33.3 g/dL (ref 30.0–36.0)
MCV: 83.2 fL (ref 78.0–100.0)
MONOS PCT: 5 % (ref 3–12)
Monocytes Absolute: 0.4 10*3/uL (ref 0.1–1.0)
NEUTROS PCT: 85 % — AB (ref 43–77)
Neutro Abs: 6.4 10*3/uL (ref 1.7–7.7)
Platelets: 219 10*3/uL (ref 150–400)
RBC: 4.81 MIL/uL (ref 3.87–5.11)
RDW: 15.2 % (ref 11.5–15.5)
WBC: 7.6 10*3/uL (ref 4.0–10.5)

## 2014-03-27 LAB — BASIC METABOLIC PANEL
Anion gap: 14 (ref 5–15)
BUN: 6 mg/dL (ref 6–23)
CHLORIDE: 103 mmol/L (ref 96–112)
CO2: 19 mmol/L (ref 19–32)
CREATININE: 0.65 mg/dL (ref 0.50–1.10)
Calcium: 8.4 mg/dL (ref 8.4–10.5)
GFR calc Af Amer: >90 mL/min (ref >90)
GFR calc non Af Amer: >90 mL/min (ref >90)
Glucose, Bld: 123 mg/dL — ABNORMAL HIGH (ref 70–99)
Potassium: 4.1 mmol/L (ref 3.5–5.1)
Sodium: 136 mmol/L (ref 135–145)

## 2014-03-27 MED ORDER — IPRATROPIUM-ALBUTEROL 0.5-2.5 (3) MG/3ML IN SOLN
RESPIRATORY_TRACT | Status: AC
  Filled 2014-03-27: qty 3

## 2014-03-27 MED ORDER — IPRATROPIUM-ALBUTEROL 0.5-2.5 (3) MG/3ML IN SOLN
3.0000 mL | Freq: Once | RESPIRATORY_TRACT | Status: AC
  Administered 2014-03-27: 3 mL via RESPIRATORY_TRACT

## 2014-03-27 NOTE — ED Notes (Addendum)
Pt reports productive cough x 4 days with vomiting from coughing so much and generalized body aches. Wheezing noted in all lung fields.

## 2014-03-28 ENCOUNTER — Emergency Department (HOSPITAL_COMMUNITY)
Admission: EM | Admit: 2014-03-28 | Discharge: 2014-03-28 | Disposition: A | Discharge status: Home / Self Care | Payer: Medicare Other | Attending: Emergency Medicine | Admitting: Emergency Medicine

## 2014-03-28 DIAGNOSIS — R059 Cough, unspecified: Secondary | ICD-10-CM

## 2014-03-28 DIAGNOSIS — R05 Cough: Secondary | ICD-10-CM

## 2014-03-28 DIAGNOSIS — R062 Wheezing: Secondary | ICD-10-CM

## 2014-03-28 LAB — CBG MONITORING, ED: Glucose-Capillary: 132 mg/dL — ABNORMAL HIGH (ref 70–99)

## 2014-03-28 MED ORDER — IPRATROPIUM BROMIDE 0.02 % IN SOLN: 0.5000 mg | Freq: Once | RESPIRATORY_TRACT | Status: DC

## 2014-03-28 MED ORDER — SODIUM CHLORIDE 0.9 % IV BOLUS (SEPSIS)
1000.0000 mL | Freq: Once | INTRAVENOUS | Status: AC
  Administered 2014-03-28: 1000 mL via INTRAVENOUS

## 2014-03-28 MED ORDER — PREDNISONE 20 MG PO TABS
60.0000 mg | ORAL_TABLET | Freq: Once | ORAL | Status: AC
  Administered 2014-03-28: 60 mg via ORAL
  Filled 2014-03-28: qty 3

## 2014-03-28 MED ORDER — ALBUTEROL (5 MG/ML) CONTINUOUS INHALATION SOLN
10.0000 mg/h | INHALATION_SOLUTION | RESPIRATORY_TRACT | Status: DC
  Administered 2014-03-28: 10 mg/h via RESPIRATORY_TRACT
  Filled 2014-03-28: qty 20

## 2014-03-28 MED ORDER — MAGNESIUM SULFATE 2 GM/50ML IV SOLN
2.0000 g | Freq: Once | INTRAVENOUS | Status: AC
  Administered 2014-03-28: 2 g via INTRAVENOUS
  Filled 2014-03-28: qty 50

## 2014-03-28 MED ORDER — PREDNISONE 20 MG PO TABS: 60.0000 mg | ORAL_TABLET | Freq: Every day | ORAL | Status: DC

## 2014-03-28 MED ORDER — IPRATROPIUM BROMIDE 0.02 % IN SOLN
1.0000 mg | Freq: Once | RESPIRATORY_TRACT | Status: AC
  Administered 2014-03-28: 1 mg via RESPIRATORY_TRACT
  Filled 2014-03-28: qty 5

## 2014-03-28 NOTE — ED Provider Notes (Signed)
CSN: [id number]     Arrival date & time 03/27/14  2212 History  This chart was scribed for Tina Balls, MD by Evelene Croon, ED Scribe. This patient was seen in room D31C/D31C and the patient's care was started 1:45 AM.    Chief Complaint  Patient presents with  . Cough    The history is provided by the patient. No language interpreter was used.     HPI Comments:  Tina Saunders is a 29 y.o. female with a h/o PNA who presents to the Emergency Department complaining of persistent cough four days with occasional sputum. She reports associated nausea and vomiting that started today, SOB with episodes of coughing, subjective fever and generalized body aches. Pt had a flu shot this season. She denies recent sick contacts. She notes episode today is similar to past episode of PNA. She denies hemoptysis. She also denies h/o asthma. No alleviating factors noted.  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  Past Medical History  Diagnosis Date  . Polycystic ovarian syndrome 07/01/2011    Patient report  . Anxiety   . Depression   . Hypertension   . Obesity   . Schizophrenia   . Bipolar 1 disorder   . Obesity   . Cancer of abdominal wall   . Rhabdosarcoma   . Diabetes mellitus without complication    Past Surgical History  Procedure Laterality Date  . Cholecystectomy    . Varicose vein surgery    . Ovarian cyst excision    . Hernia repair     Family History  Problem Relation Age of Onset  . Coronary artery disease Maternal Grandmother   . Diabetes type II Maternal Grandmother   . Cancer Maternal Grandmother   . Hypertension Mother   . Hypertension Father    History  Substance Use Topics  . Smoking status: Never Smoker   . Smokeless tobacco: Never Used  . Alcohol Use: No   OB History    Gravida Para Term Preterm AB TAB SAB Ectopic Multiple Living   0              Review of Systems  Constitutional: Positive for fever (Subjective).  Respiratory: Positive for  cough and shortness of breath.   Gastrointestinal: Positive for nausea and vomiting.  Musculoskeletal: Positive for myalgias.  All other systems reviewed and are negative.     Allergies  Fish-derived products; Geodon; Haldol; Compazine; Morphine and related; Toradol; and Buprenorphine hcl  Home Medications   Prior to Admission medications   Medication Sig Start Date End Date Taking? Authorizing Provider  busPIRone (BUSPAR) 15 MG tablet Take 15 mg by mouth 3 (three) times daily.   Yes Historical Provider, MD  clonazePAM (KLONOPIN) 0.5 MG tablet Take 1 tablet (0.5 mg total) by mouth 2 (two) times daily. 03/03/14  Yes Wandra Arthurs, MD  gabapentin (NEURONTIN) 400 MG capsule Take 400 mg by mouth 3 (three) times daily.   Yes Historical Provider, MD  Melatonin 3 MG TABS Take 3 mg by mouth daily as needed (for sleep).   Yes Historical Provider, MD  metFORMIN (GLUCOPHAGE-XR) 500 MG 24 hr tablet Take 500 mg by mouth daily.   Yes Historical Provider, MD  metoprolol succinate (TOPROL-XL) 25 MG 24 hr tablet Take 25 mg by mouth daily.   Yes Historical Provider, MD  pantoprazole (PROTONIX) 40 MG tablet Take 40 mg by mouth at bedtime.   Yes Historical Provider, MD  QUEtiapine (SEROQUEL  XR) 400 MG 24 hr tablet Take 800 mg by mouth at bedtime.   Yes Historical Provider, MD  sertraline (ZOLOFT) 100 MG tablet Take 2 tablets (200 mg total) by mouth daily. 03/03/14  Yes Wandra Arthurs, MD  zolpidem (AMBIEN) 10 MG tablet Take 20 mg by mouth at bedtime.    Yes Historical Provider, MD  cephALEXin (KEFLEX) 500 MG capsule Take 1 capsule (500 mg total) by mouth 3 (three) times daily. Patient not taking: Reported on 01/23/2014 12/28/13   Margarita Mail, PA-C  ondansetron (ZOFRAN ODT) 4 MG disintegrating tablet 4mg  ODT q4 hours prn nausea/vomit Patient not taking: Reported on 03/27/2014 02/10/14   Marissa Sciacca, PA-C  ondansetron (ZOFRAN) 4 MG tablet Take 1 tablet (4 mg total) by mouth every 8 (eight) hours as needed for nausea  or vomiting. Patient not taking: Reported on 03/27/2014 12/28/13   Margarita Mail, PA-C  phenazopyridine (PYRIDIUM) 200 MG tablet Take 1 tablet (200 mg total) by mouth 3 (three) times daily as needed (pain with urination). Patient not taking: Reported on 01/23/2014 12/28/13   Margarita Mail, PA-C  traMADol (ULTRAM) 50 MG tablet Take 1 tablet (50 mg total) by mouth every 6 (six) hours as needed. Patient not taking: Reported on 01/23/2014 12/28/13   Margarita Mail, PA-C   BP 132/98 mmHg  Pulse 106  Temp(Src) 99.1 F (37.3 C) (Oral)  Resp 20  SpO2 96%  LMP 02/16/2014 Physical Exam  Constitutional: She is oriented to person, place, and time. No distress.  Obese  HENT:  Head: Normocephalic and atraumatic.  Nose: Nose normal.  Mouth/Throat: Oropharynx is clear and moist. No oropharyngeal exudate.  Eyes: Conjunctivae and EOM are normal. Pupils are equal, round, and reactive to light. No scleral icterus.  Neck: Normal range of motion. Neck supple. No JVD present. No tracheal deviation present. No thyromegaly present.  Cardiovascular: Regular rhythm and normal heart sounds.  Tachycardia present.  Exam reveals no gallop and no friction rub.   No murmur heard. Pulmonary/Chest: Effort normal. No respiratory distress. She has wheezes (Expiratory wheezing bilaterally). She exhibits no tenderness.  Abdominal: Soft. Bowel sounds are normal. She exhibits no distension and no mass. There is no tenderness. There is no rebound and no guarding.  Musculoskeletal: Normal range of motion. She exhibits no edema or tenderness.  Lymphadenopathy:    She has no cervical adenopathy.  Neurological: She is alert and oriented to person, place, and time. No cranial nerve deficit. She exhibits normal muscle tone.  Skin: Skin is warm and dry. No rash noted. No erythema. No pallor.  Nursing note and vitals reviewed.   ED Course  Procedures   DIAGNOSTIC STUDIES:  Oxygen Saturation is 97% on RA, normal by my interpretation.     COORDINATION OF CARE:  1:52 AM Discussed treatment plan with pt at bedside and pt agreed to plan.  Labs Review Labs Reviewed  CBC WITH DIFFERENTIAL/PLATELET - Abnormal; Notable for the following:    Neutrophils Relative % 85 (*)    Lymphocytes Relative 7 (*)    Lymphs Abs 0.6 (*)    All other components within normal limits  BASIC METABOLIC PANEL - Abnormal; Notable for the following:    Glucose, Bld 123 (*)    All other components within normal limits  CBG MONITORING, ED - Abnormal; Notable for the following:    Glucose-Capillary 132 (*)    All other components within normal limits    Imaging Review Dg Chest 2 View  03/28/2014  CLINICAL DATA:  Productive cough and shortness of breath for 4 days  EXAM: CHEST  2 VIEW  COMPARISON:  03/02/2014  FINDINGS: Normal heart size and mediastinal contours. No acute infiltrate or edema. No effusion or pneumothorax. No acute osseous findings.  IMPRESSION: No active cardiopulmonary disease.   Electronically Signed   By: Monte Fantasia M.D.   On: 03/28/2014 00:33     EKG Interpretation None      MDM   Final diagnoses:  None    Patient since emergency department for productive cough and body aches. Chest x-ray does not show pneumonia, patient did get her flu shot this year. Patient to be having viral upper respiratory infection versus influenza. Patient also wheezing on exam, she denies history of asthma or COPD. She was given continuous epidural treatment, prednisone, magnesium for relief. Upon repeat evaluation the patient her wheezing had stopped. Patient's oxygen saturation was 95% on room air upon my evaluation. Patient is tachycardic however this is likely due to the albuterol treatment. She states she symptomatically feels much better. Pulmonary embolus is also possibility as the patient has remote history of cancer, however her wheezing was bilateral, she has no chest pain, and this is in the setting of productive cough and body aches  and subjective fever. At this time the patient's vital signs remain within her normal limits and she is safe for discharge.   I personally performed the services described in this documentation, which was scribed in my presence. The recorded information has been reviewed and is accurate.    Tina Balls, MD 03/28/14 [phone]

## 2014-03-28 NOTE — ED Notes (Signed)
Iv started and med given

## 2014-03-28 NOTE — ED Notes (Signed)
Cough nad body aches for 4 days.  Sl temp

## 2014-03-28 NOTE — Discharge Instructions (Signed)
Bronchospasm Ms. Body, your chest x-ray did not show pneumonia. Your given breathing treatments for your wheezing, continue to take steroids as prescribed. Follow-up with a primary care physician within 3 days for continued management. If symptoms worsen come back to the emergency department immediately. Thank you. A bronchospasm is when the tubes that carry air in and out of your lungs (airways) spasm or tighten. During a bronchospasm it is hard to breathe. This is because the airways get smaller. A bronchospasm can be triggered by:  Allergies. These may be to animals, pollen, food, or mold.  Infection. This is a common cause of bronchospasm.  Exercise.  Irritants. These include pollution, cigarette smoke, strong odors, aerosol sprays, and paint fumes.  Weather changes.  Stress.  Being emotional. HOME CARE   Always have a plan for getting help. Know when to call your doctor and local emergency services (911 in the [place] Know where you can get emergency care.  Only take medicines as told by your doctor.  If you were prescribed an inhaler or nebulizer machine, ask your doctor how to use it correctly. Always use a spacer with your inhaler if you were given one.  Stay calm during an attack. Try to relax and breathe more slowly.  Control your home environment:  Change your heating and air conditioning filter at least once a month.  Limit your use of fireplaces and wood stoves.  Do not  smoke. Do not  allow smoking in your home.  Avoid perfumes and fragrances.  Get rid of pests (such as roaches and mice) and their droppings.  Throw away plants if you see mold on them.  Keep your house clean and dust free.  Replace carpet with wood, tile, or vinyl flooring. Carpet can trap dander and dust.  Use allergy-proof pillows, mattress covers, and box spring covers.  Wash bed sheets and blankets every week in hot water. Dry them in a dryer.  Use blankets that are made of polyester  or cotton.  Wash hands frequently. GET HELP IF:  You have muscle aches.  You have chest pain.  The thick spit you spit or cough up (sputum) changes from clear or white to yellow, green, gray, or bloody.  The thick spit you spit or cough up gets thicker.  There are problems that may be related to the medicine you are given such as:  A rash.  Itching.  Swelling.  Trouble breathing. GET HELP RIGHT AWAY IF:  You feel you cannot breathe or catch your breath.  You cannot stop coughing.  Your treatment is not helping you breathe better.  You have very bad chest pain. MAKE SURE YOU:   Understand these instructions.  Will watch your condition.  Will get help right away if you are not doing well or get worse. Document Released: 11/06/2008 Document Revised: 01/14/2013 Document Reviewed: 07/02/2012 [facility] Patient Information 2015 [place], [place]. This information is not intended to replace advice given to you by your health care provider. Make sure you discuss any questions you have with your health care provider. Upper Respiratory Infection, Adult An upper respiratory infection (URI) is also known as the common cold. It is often caused by a type of germ (virus). Colds are easily spread (contagious). You can pass it to others by kissing, coughing, sneezing, or drinking out of the same glass. Usually, you get better in 1 or 2 weeks.  HOME CARE   Only take medicine as told by your doctor.  Use a warm  mist humidifier or breathe in steam from a hot shower.  Drink enough water and fluids to keep your pee (urine) clear or pale yellow.  Get plenty of rest.  Return to work when your temperature is back to normal or as told by your doctor. You may use a face mask and wash your hands to stop your cold from spreading. GET HELP RIGHT AWAY IF:   After the first few days, you feel you are getting worse.  You have questions about your medicine.  You have chills, shortness of breath,  or brown or red spit (mucus).  You have yellow or brown snot (nasal discharge) or pain in the face, especially when you bend forward.  You have a fever, puffy (swollen) neck, pain when you swallow, or white spots in the back of your throat.  You have a bad headache, ear pain, sinus pain, or chest pain.  You have a high-pitched whistling sound when you breathe in and out (wheezing).  You have a lasting cough or cough up blood.  You have sore muscles or a stiff neck. MAKE SURE YOU:   Understand these instructions.  Will watch your condition.  Will get help right away if you are not doing well or get worse. Document Released: 06/28/2007 Document Revised: 04/03/2011 Document Reviewed: 04/16/2013 [facility] Patient Information 2015 [place], [place]. This information is not intended to replace advice given to you by your health care provider. Make sure you discuss any questions you have with your health care provider.

## 2014-04-10 DIAGNOSIS — R05 Cough: Secondary | ICD-10-CM | POA: Diagnosis not present

## 2014-04-10 DIAGNOSIS — I10 Essential (primary) hypertension: Secondary | ICD-10-CM | POA: Diagnosis not present

## 2014-04-10 DIAGNOSIS — R109 Unspecified abdominal pain: Secondary | ICD-10-CM | POA: Diagnosis not present

## 2014-04-10 DIAGNOSIS — E119 Type 2 diabetes mellitus without complications: Secondary | ICD-10-CM | POA: Diagnosis not present

## 2014-05-11 ENCOUNTER — Emergency Department (HOSPITAL_COMMUNITY): Payer: Medicare Other

## 2014-05-11 ENCOUNTER — Emergency Department (HOSPITAL_COMMUNITY)
Admission: EM | Admit: 2014-05-11 | Discharge: 2014-05-11 | Disposition: A | Discharge status: Home / Self Care | Payer: Medicare Other | Attending: Emergency Medicine | Admitting: Emergency Medicine

## 2014-05-11 ENCOUNTER — Encounter (HOSPITAL_COMMUNITY): Payer: Self-pay

## 2014-05-11 DIAGNOSIS — F419 Anxiety disorder, unspecified: Secondary | ICD-10-CM | POA: Diagnosis not present

## 2014-05-11 DIAGNOSIS — E669 Obesity, unspecified: Secondary | ICD-10-CM | POA: Diagnosis not present

## 2014-05-11 DIAGNOSIS — R000 Tachycardia, unspecified: Secondary | ICD-10-CM | POA: Diagnosis not present

## 2014-05-11 DIAGNOSIS — Z85028 Personal history of other malignant neoplasm of stomach: Secondary | ICD-10-CM | POA: Insufficient documentation

## 2014-05-11 DIAGNOSIS — Z79899 Other long term (current) drug therapy: Secondary | ICD-10-CM | POA: Insufficient documentation

## 2014-05-11 DIAGNOSIS — Z3202 Encounter for pregnancy test, result negative: Secondary | ICD-10-CM | POA: Insufficient documentation

## 2014-05-11 DIAGNOSIS — F209 Schizophrenia, unspecified: Secondary | ICD-10-CM | POA: Insufficient documentation

## 2014-05-11 DIAGNOSIS — Z9049 Acquired absence of other specified parts of digestive tract: Secondary | ICD-10-CM | POA: Diagnosis not present

## 2014-05-11 DIAGNOSIS — F319 Bipolar disorder, unspecified: Secondary | ICD-10-CM | POA: Insufficient documentation

## 2014-05-11 DIAGNOSIS — R1031 Right lower quadrant pain: Secondary | ICD-10-CM | POA: Diagnosis present

## 2014-05-11 DIAGNOSIS — R10813 Right lower quadrant abdominal tenderness: Secondary | ICD-10-CM

## 2014-05-11 DIAGNOSIS — I880 Nonspecific mesenteric lymphadenitis: Secondary | ICD-10-CM | POA: Diagnosis not present

## 2014-05-11 DIAGNOSIS — Z792 Long term (current) use of antibiotics: Secondary | ICD-10-CM | POA: Diagnosis not present

## 2014-05-11 DIAGNOSIS — R112 Nausea with vomiting, unspecified: Secondary | ICD-10-CM | POA: Diagnosis not present

## 2014-05-11 DIAGNOSIS — Z7952 Long term (current) use of systemic steroids: Secondary | ICD-10-CM | POA: Diagnosis not present

## 2014-05-11 DIAGNOSIS — R162 Hepatomegaly with splenomegaly, not elsewhere classified: Secondary | ICD-10-CM | POA: Diagnosis not present

## 2014-05-11 DIAGNOSIS — E119 Type 2 diabetes mellitus without complications: Secondary | ICD-10-CM | POA: Diagnosis not present

## 2014-05-11 LAB — CBC WITH DIFFERENTIAL/PLATELET
BASOS PCT: 0 % (ref 0–1)
Basophils Absolute: 0 10*3/uL (ref 0.0–0.1)
EOS PCT: 3 % (ref 0–5)
Eosinophils Absolute: 0.2 10*3/uL (ref 0.0–0.7)
HCT: 39 % (ref 36.0–46.0)
HEMOGLOBIN: 12.7 g/dL (ref 12.0–15.0)
LYMPHS ABS: 1.3 10*3/uL (ref 0.7–4.0)
Lymphocytes Relative: 18 % (ref 12–46)
MCH: 26.6 pg (ref 26.0–34.0)
MCHC: 32.6 g/dL (ref 30.0–36.0)
MCV: 81.8 fL (ref 78.0–100.0)
MONOS PCT: 5 % (ref 3–12)
Monocytes Absolute: 0.4 10*3/uL (ref 0.1–1.0)
NEUTROS PCT: 74 % (ref 43–77)
Neutro Abs: 5.4 10*3/uL (ref 1.7–7.7)
PLATELETS: 203 10*3/uL (ref 150–400)
RBC: 4.77 MIL/uL (ref 3.87–5.11)
RDW: 15.1 % (ref 11.5–15.5)
WBC: 7.3 10*3/uL (ref 4.0–10.5)

## 2014-05-11 LAB — COMPREHENSIVE METABOLIC PANEL
ALT: 34 U/L (ref 0–35)
AST: 64 U/L — ABNORMAL HIGH (ref 0–37)
Albumin: 3.9 g/dL (ref 3.5–5.2)
Alkaline Phosphatase: 100 U/L (ref 39–117)
Anion gap: 12 (ref 5–15)
BUN: <5 mg/dL — ABNORMAL LOW (ref 6–23)
CO2: 23 mmol/L (ref 19–32)
Calcium: 9.2 mg/dL (ref 8.4–10.5)
Chloride: 102 mmol/L (ref 96–112)
Creatinine, Ser: 0.61 mg/dL (ref 0.50–1.10)
GFR calc non Af Amer: >90 mL/min (ref >90)
GLUCOSE: 180 mg/dL — AB (ref 70–99)
Potassium: 3.7 mmol/L (ref 3.5–5.1)
Sodium: 137 mmol/L (ref 135–145)
TOTAL PROTEIN: 7.4 g/dL (ref 6.0–8.3)
Total Bilirubin: 0.5 mg/dL (ref 0.3–1.2)

## 2014-05-11 LAB — URINALYSIS, ROUTINE W REFLEX MICROSCOPIC
Glucose, UA: NEGATIVE mg/dL
HGB URINE DIPSTICK: NEGATIVE
Ketones, ur: 15 mg/dL — AB
Nitrite: NEGATIVE
Protein, ur: NEGATIVE mg/dL
SPECIFIC GRAVITY, URINE: 1.031 — AB (ref 1.005–1.030)
Urobilinogen, UA: 0.2 mg/dL (ref 0.0–1.0)
pH: 5.5 (ref 5.0–8.0)

## 2014-05-11 LAB — URINE MICROSCOPIC-ADD ON

## 2014-05-11 LAB — LIPASE, BLOOD: Lipase: 37 U/L (ref 11–59)

## 2014-05-11 LAB — PREGNANCY, URINE: Preg Test, Ur: NEGATIVE

## 2014-05-11 MED ORDER — CIPROFLOXACIN HCL 500 MG PO TABS: 500.0000 mg | ORAL_TABLET | Freq: Two times a day (BID) | ORAL | Status: DC

## 2014-05-11 MED ORDER — SODIUM CHLORIDE 0.9 % IV BOLUS (SEPSIS)
1000.0000 mL | Freq: Once | INTRAVENOUS | Status: AC
  Administered 2014-05-11: 1000 mL via INTRAVENOUS

## 2014-05-11 MED ORDER — ONDANSETRON 4 MG PO TBDP: 4.0000 mg | ORAL_TABLET | Freq: Three times a day (TID) | ORAL | Status: DC | PRN

## 2014-05-11 MED ORDER — IOHEXOL 300 MG/ML  SOLN
25.0000 mL | Freq: Once | INTRAMUSCULAR | Status: AC | PRN
  Administered 2014-05-11: 25 mL via ORAL

## 2014-05-11 MED ORDER — HYDROMORPHONE HCL 1 MG/ML IJ SOLN
1.0000 mg | Freq: Once | INTRAMUSCULAR | Status: AC
  Administered 2014-05-11: 1 mg via INTRAVENOUS
  Filled 2014-05-11: qty 1

## 2014-05-11 MED ORDER — HYDROCODONE-ACETAMINOPHEN 5-325 MG PO TABS: 1.0000 | ORAL_TABLET | Freq: Four times a day (QID) | ORAL | Status: DC | PRN

## 2014-05-11 MED ORDER — METRONIDAZOLE 500 MG PO TABS: 500.0000 mg | ORAL_TABLET | Freq: Three times a day (TID) | ORAL | Status: DC

## 2014-05-11 MED ORDER — IOHEXOL 300 MG/ML  SOLN
100.0000 mL | Freq: Once | INTRAMUSCULAR | Status: AC | PRN
  Administered 2014-05-11: 100 mL via INTRAVENOUS

## 2014-05-11 MED ORDER — ONDANSETRON HCL 4 MG/2ML IJ SOLN
4.0000 mg | Freq: Once | INTRAMUSCULAR | Status: AC
  Administered 2014-05-11: 4 mg via INTRAVENOUS
  Filled 2014-05-11: qty 2

## 2014-05-11 MED ORDER — NAPROXEN 500 MG PO TABS: 500.0000 mg | ORAL_TABLET | Freq: Two times a day (BID) | ORAL | Status: DC | PRN

## 2014-05-11 NOTE — ED Notes (Signed)
Attempted blood draws x2 unsuccessful; pt stated she used to have a port but does not anymore and that usually when she is in the hospital "special nurses" start her IVs

## 2014-05-11 NOTE — ED Notes (Signed)
Patient returned from CT, placed back on bp cuff and oxygen sat monitor.

## 2014-05-11 NOTE — ED Provider Notes (Signed)
This 29 year old female, very obese, complains of right lower abdominal pain, on exam has reproducible tenderness to the right lower quadrant and the right side, she has had a cholecystectomy in the past. She did have an episode of vomiting earlier today. She has no CVA tenderness, bedside ultrasound reveals no obvious hydronephrosis, increased concern for acute appendicitis, CT scan ordered. Pain medicines ordered.  Medical screening examination/treatment/procedure(s) were conducted as a shared visit with non-physician practitioner(s) and myself.  I personally evaluated the patient during the encounter.  Clinical Impression:   Final diagnoses:  RLQ abdominal tenderness  Mesenteric adenitis  Non-intractable vomiting with nausea, vomiting of unspecified type         Noemi Chapel, MD 05/12/14 [phone]

## 2014-05-11 NOTE — ED Notes (Signed)
Patient given Coke to drink 

## 2014-05-11 NOTE — ED Notes (Signed)
CT notified of patient finished oral contrast.

## 2014-05-11 NOTE — Discharge Instructions (Signed)
Your abdominal pain is due to mesenteric adenitis, an infection in the abdomen. Take cipro and flagyl as directed until finished and do not consume alcohol while taking these. Use zofran as needed for nausea. Use norco and naprosyn as directed as needed for pain but don't drive while taking norco. Follow up with your regular doctor in 5-7 days for recheck of symptoms. Return to the ER for changes or worsening symptoms.  Abdominal (belly) pain can be caused by many things. Your caregiver performed an examination and possibly ordered blood/urine tests and imaging (CT scan, x-rays, ultrasound). Many cases can be observed and treated at home after initial evaluation in the emergency department. Even though you are being discharged home, abdominal pain can be unpredictable. Therefore, you need a repeated exam if your pain does not resolve, returns, or worsens. Most patients with abdominal pain don't have to be admitted to the hospital or have surgery, but serious problems like appendicitis and gallbladder attacks can start out as nonspecific pain. Many abdominal conditions cannot be diagnosed in one visit, so follow-up evaluations are very important. SEEK IMMEDIATE MEDICAL ATTENTION IF YOU DEVELOP ANY OF THE FOLLOWING SYMPTOMS:  The pain does not go away or becomes severe.   A temperature above 101 develops.   Repeated vomiting occurs (multiple episodes).   The pain becomes localized to portions of the abdomen. The right side could possibly be appendicitis. In an adult, the left lower portion of the abdomen could be colitis or diverticulitis.   Blood is being passed in stools or vomit (bright red or black tarry stools).   Return also if you develop chest pain, difficulty breathing, dizziness or fainting, or become confused, poorly responsive, or inconsolable (young children).  The constipation stays for more than 4 days.   There is belly (abdominal) or rectal pain.   You do not seem to be getting  better.    Metformin and X-ray Contrast Studies For some X-ray exams, a contrast dye is used. Contrast dye is a type of medicine used to make the X-ray image clearer. The contrast dye is given to the patient through a vein (intravenously). If you need to have this type of X-ray exam and you take a medication called metformin, your caregiver may have you stop taking metformin before the exam.  LACTIC ACIDOSIS In rare cases, a serious medical condition called lactic acidosis can develop in people who take metformin and receive contrast dye. The following conditions can increase the risk of this complication:   Kidney failure.  Liver problems.  Certain types of heart problems such as:  Heart failure.  Heart attack.  Heart infection.  Heart valve problems.  Alcohol abuse. If left untreated, lactic acidosis can lead to coma.  SYMPTOMS OF LACTIC ACIDOSIS Symptoms of lactic acidosis can include:  Rapid breathing (hyperventilation).  Neurologic symptoms such as:  Headaches.  Confusion.  Dizziness.  Excessive sweating.  Feeling sick to your stomach (nauseous) or throwing up (vomiting). AFTER THE X-RAY EXAM  Stay well-hydrated. Drink fluids as instructed by your caregiver.  If you have a risk of developing lactic acidosis, blood tests may be done to make sure your kidney function is okay.  Metformin is usually stopped for 48 hours after the X-ray exam. Ask your caregiver when you can start taking metformin again. SEEK MEDICAL CARE IF:   You have shortness of breath or difficulty breathing.  You develop a headache that does not go away.  You have nausea or vomiting.  You  urinate more than normal.  You develop a skin rash and have:  Redness.  Swelling.  Itching. Document Released: 12/28/2008 Document Revised: 04/03/2011 Document Reviewed: 12/28/2008 [facility] Patient Information 2015 [place], [place]. This information is not intended to replace advice given to you by  your health care provider. Make sure you discuss any questions you have with your health care provider.   Abdominal Pain Many things can cause belly (abdominal) pain. Most times, the belly pain is not dangerous. Many cases of belly pain can be watched and treated at home. HOME CARE   Do not take medicines that help you go poop (laxatives) unless told to by your doctor.  Only take medicine as told by your doctor.  Eat or drink as told by your doctor. Your doctor will tell you if you should be on a special diet. GET HELP IF:  You do not know what is causing your belly pain.  You have belly pain while you are sick to your stomach (nauseous) or have runny poop (diarrhea).  You have pain while you pee or poop.  Your belly pain wakes you up at night.  You have belly pain that gets worse or better when you eat.  You have belly pain that gets worse when you eat fatty foods.  You have a fever. GET HELP RIGHT AWAY IF:   The pain does not go away within 2 hours.  You keep throwing up (vomiting).  The pain changes and is only in the right or left part of the belly.  You have bloody or tarry looking poop. MAKE SURE YOU:   Understand these instructions.  Will watch your condition.  Will get help right away if you are not doing well or get worse. Document Released: 06/28/2007 Document Revised: 01/14/2013 Document Reviewed: 09/18/2012 [facility] Patient Information 2015 [place], [place]. This information is not intended to replace advice given to you by your health care provider. Make sure you discuss any questions you have with your health care provider.  Nausea and Vomiting Nausea is a sick feeling that often comes before throwing up (vomiting). Vomiting is a reflex where stomach contents come out of your mouth. Vomiting can cause severe loss of body fluids (dehydration). Children and elderly adults can become dehydrated quickly, especially if they also have diarrhea. Nausea and  vomiting are symptoms of a condition or disease. It is important to find the cause of your symptoms. CAUSES   Direct irritation of the stomach lining. This irritation can result from increased acid production (gastroesophageal reflux disease), infection, food poisoning, taking certain medicines (such as nonsteroidal anti-inflammatory drugs), alcohol use, or tobacco use.  Signals from the brain.These signals could be caused by a headache, heat exposure, an inner ear disturbance, increased pressure in the brain from injury, infection, a tumor, or a concussion, pain, emotional stimulus, or metabolic problems.  An obstruction in the gastrointestinal tract (bowel obstruction).  Illnesses such as diabetes, hepatitis, gallbladder problems, appendicitis, kidney problems, cancer, sepsis, atypical symptoms of a heart attack, or eating disorders.  Medical treatments such as chemotherapy and radiation.  Receiving medicine that makes you sleep (general anesthetic) during surgery. DIAGNOSIS Your caregiver may ask for tests to be done if the problems do not improve after a few days. Tests may also be done if symptoms are severe or if the reason for the nausea and vomiting is not clear. Tests may include:  Urine tests.  Blood tests.  Stool tests.  Cultures (to look for evidence of infection).  X-rays or other imaging studies. Test results can help your caregiver make decisions about treatment or the need for additional tests. TREATMENT You need to stay well hydrated. Drink frequently but in small amounts.You may wish to drink water, sports drinks, clear broth, or eat frozen ice pops or gelatin dessert to help stay hydrated.When you eat, eating slowly may help prevent nausea.There are also some antinausea medicines that may help prevent nausea. HOME CARE INSTRUCTIONS   Take all medicine as directed by your caregiver.  If you do not have an appetite, do not force yourself to eat. However, you must  continue to drink fluids.  If you have an appetite, eat a normal diet unless your caregiver tells you differently.  Eat a variety of complex carbohydrates (rice, wheat, potatoes, bread), lean meats, yogurt, fruits, and vegetables.  Avoid high-fat foods because they are more difficult to digest.  Drink enough water and fluids to keep your urine clear or pale yellow.  If you are dehydrated, ask your caregiver for specific rehydration instructions. Signs of dehydration may include:  Severe thirst.  Dry lips and mouth.  Dizziness.  Dark urine.  Decreasing urine frequency and amount.  Confusion.  Rapid breathing or pulse. SEEK IMMEDIATE MEDICAL CARE IF:   You have blood or brown flecks (like coffee grounds) in your vomit.  You have black or bloody stools.  You have a severe headache or stiff neck.  You are confused.  You have severe abdominal pain.  You have chest pain or trouble breathing.  You do not urinate at least once every 8 hours.  You develop cold or clammy skin.  You continue to vomit for longer than 24 to 48 hours.  You have a fever. MAKE SURE YOU:   Understand these instructions.  Will watch your condition.  Will get help right away if you are not doing well or get worse. Document Released: 01/09/2005 Document Revised: 04/03/2011 Document Reviewed: 06/08/2010 [facility] Patient Information 2015 [place], [place]. This information is not intended to replace advice given to you by your health care provider. Make sure you discuss any questions you have with your health care provider.  Mesenteric Adenitis Mesenteric adenitis is an inflammation of lymph nodes (glands) in the abdomen. It may appear to mimic appendicitis symptoms. It is most common in children. The cause of this may be an infection somewhere else in the body. It usually gets well without treatment but can cause problems for up to a couple weeks. SYMPTOMS  The most common problems  are:  Fever.  Abdominal pain and tenderness.  Nausea, vomiting, and/or diarrhea. DIAGNOSIS  Your caregiver may have an idea what is wrong by examining you or your child. Sometimes lab work and other studies such as Ultrasonography and a CT scan of the abdomen are done.  TREATMENT  Children with mesenteric adenitis will get well without further treatment. Treatment includes rest, pain medications, and fluids. HOME CARE INSTRUCTIONS   Do not take or give laxatives unless ordered by your caregiver.  Use pain medications as directed.  Follow the diet recommended by your caregiver. SEEK IMMEDIATE MEDICAL CARE IF:   The pain does not go away or becomes severe.  An oral temperature above 102 F (38.9 C) develops.  Repeated vomiting occurs.  The pain becomes localized in the right lower quadrant of the abdomen (possibly appendicitis).  You or your child notice bright red or black tarry stools. MAKE SURE YOU:   Understand these instructions.  Will watch your  condition.  Will get help right away if you are not doing well or get worse. Document Released: 10/13/2005 Document Revised: 04/03/2011 Document Reviewed: 04/16/2013 [facility] Patient Information 2015 [place], [place]. This information is not intended to replace advice given to you by your health care provider. Make sure you discuss any questions you have with your health care provider.

## 2014-05-11 NOTE — ED Notes (Addendum)
Pt here for RLQ pain since 0200. Pt has nausea and vomiting x 1. Still has appendix and also polycystic ovarian syndrome but pt sts it feels different than that pain.

## 2014-05-11 NOTE — ED Notes (Signed)
Patient transported to CT 

## 2014-05-11 NOTE — ED Provider Notes (Signed)
CSN: [id number]     Arrival date & time 05/11/14  1430 History   First MD Initiated Contact with Patient 05/11/14 1748     Chief Complaint  Patient presents with  . Abdominal Pain     (Consider location/radiation/quality/duration/timing/severity/associated sxs/prior Treatment) HPI Comments: Tina Saunders is a 29 y.o. 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 She states the pain is 9/10 constant nonradiating sharp and stabbing, worse with movement and bumps in the road, and unrelieved with ibuprofen. She reports one episode of nonbloody nonbilious emesis consisting of stomach contents just prior to arrival, and states she has ongoing nausea. Additionally she complains of chills but denies having a fever. Denies any chest pain, shortness of breath, diarrhea, constipation, melena, hematochezia, hematemesis, dysuria, hematuria, vaginal bleeding or discharge, numbness, tingling, weakness, recent travel, sick contacts, antibiotic use, suspicious food intake, alcohol use, or NSAID use. His initial chemistry was 2 weeks ago. She is not infectious active in more than one year. She follows up with [facility] Doctor Doreatha Massed, for her rhabdosarcoma-- cancer free x66yr.   Patient is a 29 y.o. female presenting with abdominal pain. The history is provided by the patient. No language interpreter was used.  Abdominal Pain Pain location:  RLQ Pain quality: sharp and stabbing   Pain radiates to:  Does not radiate Pain severity:  Moderate Onset quality:  Sudden Duration:  15 hours Timing:  Constant Progression:  Worsening Chronicity:  New Context: not recent sexual activity, not recent travel, not sick  contacts and not suspicious food intake   Relieved by:  Nothing Worsened by:  Movement Ineffective treatments:  NSAIDs Associated symptoms: chills, nausea and vomiting (1 episode of NBNB emesis)   Associated symptoms: no chest pain, no constipation, no diarrhea, no dysuria, no fever, no flatus, no hematemesis, no hematochezia, no hematuria, no melena, no shortness of breath, no vaginal bleeding and no vaginal discharge   Risk factors: obesity   Risk factors: no alcohol abuse and no NSAID use     Past Medical History  Diagnosis Date  . Polycystic ovarian syndrome 07/01/2011    Patient report  . Anxiety   . Depression   . Hypertension   . Obesity   . Schizophrenia   . Bipolar 1 disorder   . Obesity   . Cancer of abdominal wall   . Rhabdosarcoma   . Diabetes mellitus without complication    Past Surgical History  Procedure Laterality Date  . Cholecystectomy    . Varicose vein surgery    . Ovarian cyst excision    . Hernia repair     Family History  Problem Relation Age of Onset  . Coronary artery disease Maternal Grandmother   . Diabetes type II Maternal Grandmother   . Cancer Maternal Grandmother   . Hypertension Mother   . Hypertension Father    History  Substance Use Topics  . Smoking status: Never Smoker   . Smokeless tobacco: Never Used  . Alcohol Use: No   OB History    Gravida Para Term Preterm AB TAB SAB Ectopic Multiple Living   0              Review of Systems  Constitutional: Positive  for chills. Negative for fever.  Respiratory: Negative for shortness of breath.   Cardiovascular: Negative for chest pain.  Gastrointestinal: Positive for nausea, vomiting (1 episode of NBNB emesis) and abdominal pain. Negative for diarrhea, constipation, melena, hematochezia, flatus and hematemesis.  Genitourinary: Negative for dysuria, hematuria, flank pain, vaginal bleeding, vaginal discharge and pelvic pain.  Musculoskeletal: Negative for myalgias and arthralgias.    Skin: Negative for color change.  Allergic/Immunologic: Positive for immunocompromised state (diabetic).  Neurological: Negative for weakness and numbness.  Psychiatric/Behavioral: Negative for confusion.   10 Systems reviewed and are negative for acute change except as noted in the HPI.    Allergies  Fish-derived products; Geodon; Haldol; Compazine; Morphine and related; Toradol; and Buprenorphine hcl  Home Medications   Prior to Admission medications   Medication Sig Start Date End Date Taking? Authorizing Provider  busPIRone (BUSPAR) 15 MG tablet Take 15 mg by mouth 3 (three) times daily.   Yes Historical Provider, MD  gabapentin (NEURONTIN) 400 MG capsule Take 400 mg by mouth 3 (three) times daily.   Yes Historical Provider, MD  Melatonin 3 MG TABS Take 3 mg by mouth at bedtime.    Yes Historical Provider, MD  metFORMIN (GLUMETZA) 1000 MG (MOD) 24 hr tablet Take 1,000 mg by mouth at bedtime.   Yes Historical Provider, MD  metoprolol succinate (TOPROL-XL) 25 MG 24 hr tablet Take 25 mg by mouth at bedtime.    Yes Historical Provider, MD  pantoprazole (PROTONIX) 40 MG tablet Take 40 mg by mouth at bedtime.   Yes Historical Provider, MD  QUEtiapine (SEROQUEL XR) 400 MG 24 hr tablet Take 800 mg by mouth at bedtime.   Yes Historical Provider, MD  sertraline (ZOLOFT) 50 MG tablet Take 150 mg by mouth at bedtime. 05/01/14  Yes Historical Provider, MD  cephALEXin (KEFLEX) 500 MG capsule Take 1 capsule (500 mg total) by mouth 3 (three) times daily. Patient not taking: Reported on 01/23/2014 12/28/13   Margarita Mail, PA-C  clonazePAM (KLONOPIN) 0.5 MG tablet Take 1 tablet (0.5 mg total) by mouth 2 (two) times daily. Patient not taking: Reported on 05/11/2014 03/03/14   Wandra Arthurs, MD  ondansetron (ZOFRAN ODT) 4 MG disintegrating tablet 4mg  ODT q4 hours prn nausea/vomit Patient not taking: Reported on 03/27/2014 02/10/14   Marissa Sciacca, PA-C  ondansetron (ZOFRAN) 4 MG tablet Take 1 tablet (4 mg  total) by mouth every 8 (eight) hours as needed for nausea or vomiting. Patient not taking: Reported on 03/27/2014 12/28/13   Margarita Mail, PA-C  phenazopyridine (PYRIDIUM) 200 MG tablet Take 1 tablet (200 mg total) by mouth 3 (three) times daily as needed (pain with urination). Patient not taking: Reported on 01/23/2014 12/28/13   Margarita Mail, PA-C  predniSONE (DELTASONE) 20 MG tablet Take 3 tablets (60 mg total) by mouth daily. Patient not taking: Reported on 05/11/2014 03/28/14   Everlene Balls, MD  sertraline (ZOLOFT) 100 MG tablet Take 2 tablets (200 mg total) by mouth daily. Patient not taking: Reported on 05/11/2014 03/03/14   Wandra Arthurs, MD  traMADol (ULTRAM) 50 MG tablet Take 1 tablet (50 mg total) by mouth every 6 (six) hours as needed. Patient not taking: Reported on 01/23/2014 12/28/13   Margarita Mail, PA-C   TRIAGE VITALS: BP 153/97 mmHg  Pulse 117  Temp(Src) 98.2 F (36.8 C) (Oral)  Resp 18  Ht 5\' 4"  (1.626 m)  Wt 270 lb (122.471 kg)  BMI 46.32 kg/m2  SpO2 95%  LMP 04/27/2014 Physical  Exam  Constitutional: She is oriented to person, place, and time. She appears well-developed and well-nourished.  Non-toxic appearance. No distress.  Afebrile, nontoxic, NAD, morbidly obese, slightly tachycardic on triage vitals. Mild HTN noted.   HENT:  Head: Normocephalic and atraumatic.  Mouth/Throat: Oropharynx is clear and moist. Mucous membranes are dry.  Mildly dry mucous membranes  Eyes: Conjunctivae and EOM are normal. Right eye exhibits no discharge. Left eye exhibits no discharge.  Neck: Normal range of motion. Neck supple.  Cardiovascular: Regular rhythm, normal heart sounds and intact distal pulses.  Tachycardia present.  Exam reveals no gallop and no friction rub.   No murmur heard. Slightly tachycardic on triage, improved on exam, reg rhythm, nl s1/s2, no m/r/g, no pedal edema  Pulmonary/Chest: Effort normal and breath sounds normal. No respiratory distress. She has no decreased breath  sounds. She has no wheezes. She has no rhonchi. She has no rales.  Abdominal: Soft. Normal appearance and bowel sounds are normal. She exhibits no distension. There is tenderness in the right lower quadrant. There is rebound, guarding and tenderness at McBurney's point. There is no rigidity, no CVA tenderness and negative Murphy's sign.    Morbidly obese which limits exam. Soft, nondistended, +BS throughout, with RLQ TTP over mcburney's point, mild guarding and +rebound, +rovsing's, no rigidity, neg murphy's, no CVA TTP   Musculoskeletal: Normal range of motion.  MAE x4 Strength and sensation grossly intact Distal pulses intact  Neurological: She is alert and oriented to person, place, and time. She has normal strength. No sensory deficit.  Skin: Skin is warm, dry and intact. No rash noted.  Psychiatric: She has a normal mood and affect.  Nursing note and vitals reviewed.   ED Course  Procedures (including critical care time) Labs Review Labs Reviewed  URINALYSIS, ROUTINE W REFLEX MICROSCOPIC - Abnormal; Notable for the following:    Color, Urine AMBER (*)    APPearance TURBID (*)    Specific Gravity, Urine 1.031 (*)    Bilirubin Urine SMALL (*)    Ketones, ur 15 (*)    Leukocytes, UA SMALL (*)    All other components within normal limits  COMPREHENSIVE METABOLIC PANEL - Abnormal; Notable for the following:    Glucose, Bld 180 (*)    BUN <5 (*)    AST 64 (*)    All other components within normal limits  URINE MICROSCOPIC-ADD ON - Abnormal; Notable for the following:    Squamous Epithelial / LPF MANY (*)    Bacteria, UA MANY (*)    All other components within normal limits  PREGNANCY, URINE  CBC WITH DIFFERENTIAL/PLATELET  LIPASE, BLOOD  CBC WITH DIFFERENTIAL/PLATELET    Imaging Review Ct Abdomen Pelvis W Contrast  05/11/2014   CLINICAL DATA:  Right lower quadrant pain for 1 day  EXAM: CT ABDOMEN AND PELVIS WITH CONTRAST  TECHNIQUE: Multidetector CT imaging of the abdomen  and pelvis was performed using the standard protocol following bolus administration of intravenous contrast.  CONTRAST:  19mL OMNIPAQUE IOHEXOL 300 MG/ML  SOLN  COMPARISON:  02/10/2014  FINDINGS: Normal appendix  Diffuse hepatic steatosis.  Hepatomegaly.  Postcholecystectomy.  Splenomegaly.  Pancreas, adrenal glands, kidneys are within normal limits.  No free-fluid.  Small inflammatory nodes in the right lower quadrant mesenteric.  Bladder, uterus, and adnexa are within normal limits  Small ventral hernia in the lower abdomen, at the midline contains adipose tissue.  IMPRESSION: Normal appendix.  Hepatosplenomegaly.  Stable.  Inflammatory nodes in the right lower  quadrant. Consider mesenteric adenitis.   Electronically Signed   By: Marybelle Killings M.D.   On: 05/11/2014 21:31     EKG Interpretation None      MDM   Final diagnoses:  RLQ abdominal tenderness  Mesenteric adenitis  Non-intractable vomiting with nausea, vomiting of unspecified type    29 y.o. female here with sudden onset RLQ pain, exam reveals guarding and rebound pain, +rovsing's sign. Hx of rhabdosarcoma with abdominal wall mets s/p resection, cancer free x58yr. DDx includes appendicitis vs mets vs pelvic etiology (hx of PCOS). No pelvic complaints and pt not sexually active, doubt need for pelvic exam at this time. Upreg neg, U/A contaminated. Pt tachycardic but afebrile. Awaiting labs. Will give fluids and pain/nausea meds and obtain CT abd/pelvis.   7:25 PM Pt having worsening pain, will repeat diluadid dose. CBC unremarkable, CMP with hyperglycemia and mildly elevated AST (similar to prior values), lipase WNL. Awaiting CT.  10:18 PM Pain and nausea improved, tolerating PO well, tachycardia resolved after fluids. CT showing inflammatory nodes in RLQ c/w mesenteric adenitis. Will treat with cipro/flagyl. Will send home with zofran and pain meds. Will have her f/up with PCP in 5-7 days for recheck. I explained the diagnosis and have  given explicit precautions to return to the ER including for any other new or worsening symptoms. The patient understands and accepts the medical plan as it's been dictated and I have answered their questions. Discharge instructions concerning home care and prescriptions have been given. The patient is STABLE and is discharged to home in good condition.  BP 124/73 mmHg  Pulse 98  Temp(Src) 98.2 F (36.8 C) (Oral)  Resp 17  Ht 5\' 4"  (1.626 m)  Wt 270 lb (122.471 kg)  BMI 46.32 kg/m2  SpO2 97%  LMP 04/27/2014  Meds ordered this encounter  Medications  . sodium chloride 0.9 % bolus 1,000 mL    Sig:   . HYDROmorphone (DILAUDID) injection 1 mg    Sig:   . ondansetron (ZOFRAN) injection 4 mg    Sig:   . iohexol (OMNIPAQUE) 300 MG/ML solution 25 mL    Sig:   . HYDROmorphone (DILAUDID) injection 1 mg    Sig:   . iohexol (OMNIPAQUE) 300 MG/ML solution 100 mL    Sig:   . ciprofloxacin (CIPRO) 500 MG tablet    Sig: Take 1 tablet (500 mg total) by mouth 2 (two) times daily. One po bid x 7 days    Dispense:  14 tablet    Refill:  0    Order Specific Question:  Supervising Provider    Answer:  Sabra Heck, BRIAN [[id number]]  . metroNIDAZOLE (FLAGYL) 500 MG tablet    Sig: Take 1 tablet (500 mg total) by mouth 3 (three) times daily. One po tid x 7 days    Dispense:  21 tablet    Refill:  0    Order Specific Question:  Supervising Provider    Answer:  Sabra Heck, BRIAN [[id number]]  . ondansetron (ZOFRAN ODT) 4 MG disintegrating tablet    Sig: Take 1 tablet (4 mg total) by mouth every 8 (eight) hours as needed for nausea or vomiting.    Dispense:  15 tablet    Refill:  0    Order Specific Question:  Supervising Provider    Answer:  MILLER, BRIAN [[id number]]  . HYDROcodone-acetaminophen (NORCO) 5-325 MG per tablet    Sig: Take 1 tablet by mouth every 6 (six) hours as needed for severe pain.  Dispense:  10 tablet    Refill:  0    Order Specific Question:  Supervising Provider    Answer:  Sabra Heck, BRIAN [[id number]]   . naproxen (NAPROSYN) 500 MG tablet    Sig: Take 1 tablet (500 mg total) by mouth 2 (two) times daily as needed for mild pain, moderate pain or headache (TAKE WITH MEALS.).    Dispense:  20 tablet    Refill:  0    Order Specific Question:  Supervising Provider    Answer:  Noemi Chapel [[id number]]     Lillyth Spong Camprubi-Soms, PA-C 05/11/14 2222  Noemi Chapel, MD 05/12/14 [phone]

## 2014-05-24 ENCOUNTER — Emergency Department (HOSPITAL_COMMUNITY)
Admission: EM | Admit: 2014-05-24 | Discharge: 2014-05-24 | Disposition: A | Discharge status: Home / Self Care | Payer: Medicare Other | Attending: Emergency Medicine | Admitting: Emergency Medicine

## 2014-05-24 ENCOUNTER — Encounter (HOSPITAL_COMMUNITY): Payer: Self-pay | Admitting: Emergency Medicine

## 2014-05-24 DIAGNOSIS — I10 Essential (primary) hypertension: Secondary | ICD-10-CM | POA: Insufficient documentation

## 2014-05-24 DIAGNOSIS — F319 Bipolar disorder, unspecified: Secondary | ICD-10-CM | POA: Diagnosis not present

## 2014-05-24 DIAGNOSIS — E119 Type 2 diabetes mellitus without complications: Secondary | ICD-10-CM | POA: Diagnosis not present

## 2014-05-24 DIAGNOSIS — Z8589 Personal history of malignant neoplasm of other organs and systems: Secondary | ICD-10-CM | POA: Diagnosis not present

## 2014-05-24 DIAGNOSIS — F419 Anxiety disorder, unspecified: Secondary | ICD-10-CM | POA: Diagnosis not present

## 2014-05-24 DIAGNOSIS — Z79899 Other long term (current) drug therapy: Secondary | ICD-10-CM | POA: Insufficient documentation

## 2014-05-24 DIAGNOSIS — E669 Obesity, unspecified: Secondary | ICD-10-CM | POA: Diagnosis not present

## 2014-05-24 MED ORDER — LORAZEPAM 1 MG PO TABS: 1.0000 mg | ORAL_TABLET | Freq: Three times a day (TID) | ORAL | Status: DC | PRN

## 2014-05-24 MED ORDER — GABAPENTIN 400 MG PO CAPS: 800.0000 mg | ORAL_CAPSULE | Freq: Every day | ORAL | Status: DC

## 2014-05-24 NOTE — Discharge Instructions (Signed)
Take you prescriptions as directed. Follow up with your doctor for further evaluation.

## 2014-05-24 NOTE — ED Notes (Addendum)
C/o increased anxiety.  Denies suicidal/homicidal ideation.  States she is out of Neurontin and MD will not give her another Rx until she has office visit.  She is unsure when she can go to dr.

## 2014-05-24 NOTE — ED Provider Notes (Signed)
CSN: [id number]     Arrival date & time 05/24/14  2142 History  This chart was scribed for non-physician provider Alvina Chou, PA-C, working with Wandra Arthurs, MD by Irene Pap, ED Scribe. This patient was seen in room TR08C/TR08C and patient care was started at 10:20 PM.    Chief Complaint  Patient presents with  . Anxiety   Patient is a 29 y.o. female presenting with anxiety. The history is provided by the patient. No language interpreter was used.  Anxiety  HPI Comments: Tina Saunders is a 29 y.o. female with a history of anxiety and depression who presents to the Emergency Department complaining of anxiety onset 3 days ago. She states that she has been off her Neurontin for a week. She states that [facility] wanted her to do a walk-in to receive a new prescription but was unable to get there because she did not have any gas. She states that she got the ED via scooter. She states that she has been unable to sleep due to the increased anxiety and is not feeling like herself. She states that she missed the hospital appointment which is why they wanted her to do a walk in appointment. She states that she will receive her disability check soon so she will be able to afford gas to go to [facility]. She states that she has been taking all of her other daily medication regularly.    Past Medical History  Diagnosis Date  . Polycystic ovarian syndrome 07/01/2011    Patient report  . Anxiety   . Depression   . Hypertension   . Obesity   . Schizophrenia   . Bipolar 1 disorder   . Obesity   . Cancer of abdominal wall   . Rhabdosarcoma   . Diabetes mellitus without complication    Past Surgical History  Procedure Laterality Date  . Cholecystectomy    . Varicose vein surgery    . Ovarian cyst excision    . Hernia repair     Family History  Problem Relation Age of Onset  . Coronary artery disease Maternal Grandmother   . Diabetes type II Maternal Grandmother   . Cancer Maternal  Grandmother   . Hypertension Mother   . Hypertension Father    History  Substance Use Topics  . Smoking status: Never Smoker   . Smokeless tobacco: Never Used  . Alcohol Use: No   OB History    Gravida Para Term Preterm AB TAB SAB Ectopic Multiple Living   0              Review of Systems  Psychiatric/Behavioral: The patient is nervous/anxious.   All other systems reviewed and are negative.   Allergies  Fish-derived products; Geodon; Haldol; Compazine; Morphine and related; Toradol; and Buprenorphine hcl  Home Medications   Prior to Admission medications   Medication Sig Start Date End Date Taking? Authorizing Provider  busPIRone (BUSPAR) 15 MG tablet Take 15 mg by mouth 3 (three) times daily.    Historical Provider, MD  cephALEXin (KEFLEX) 500 MG capsule Take 1 capsule (500 mg total) by mouth 3 (three) times daily. Patient not taking: Reported on 01/23/2014 12/28/13   Margarita Mail, PA-C  ciprofloxacin (CIPRO) 500 MG tablet Take 1 tablet (500 mg total) by mouth 2 (two) times daily. One po bid x 7 days 05/11/14   Mercedes Camprubi-Soms, PA-C  clonazePAM (KLONOPIN) 0.5 MG tablet Take 1 tablet (0.5 mg total) by mouth 2 (two) times daily.  Patient not taking: Reported on 05/11/2014 03/03/14   Wandra Arthurs, MD  gabapentin (NEURONTIN) 400 MG capsule Take 400 mg by mouth 3 (three) times daily.    Historical Provider, MD  HYDROcodone-acetaminophen (NORCO) 5-325 MG per tablet Take 1 tablet by mouth every 6 (six) hours as needed for severe pain. 05/11/14   Mercedes Camprubi-Soms, PA-C  Melatonin 3 MG TABS Take 3 mg by mouth at bedtime.     Historical Provider, MD  metFORMIN (GLUMETZA) 1000 MG (MOD) 24 hr tablet Take 1,000 mg by mouth at bedtime.    Historical Provider, MD  metoprolol succinate (TOPROL-XL) 25 MG 24 hr tablet Take 25 mg by mouth at bedtime.     Historical Provider, MD  metroNIDAZOLE (FLAGYL) 500 MG tablet Take 1 tablet (500 mg total) by mouth 3 (three) times daily. One po tid x 7  days 05/11/14   Mercedes Camprubi-Soms, PA-C  naproxen (NAPROSYN) 500 MG tablet Take 1 tablet (500 mg total) by mouth 2 (two) times daily as needed for mild pain, moderate pain or headache (TAKE WITH MEALS.). 05/11/14   Mercedes Camprubi-Soms, PA-C  ondansetron (ZOFRAN ODT) 4 MG disintegrating tablet 4mg  ODT q4 hours prn nausea/vomit Patient not taking: Reported on 03/27/2014 02/10/14   Marissa Sciacca, PA-C  ondansetron (ZOFRAN ODT) 4 MG disintegrating tablet Take 1 tablet (4 mg total) by mouth every 8 (eight) hours as needed for nausea or vomiting. 05/11/14   Mercedes Camprubi-Soms, PA-C  ondansetron (ZOFRAN) 4 MG tablet Take 1 tablet (4 mg total) by mouth every 8 (eight) hours as needed for nausea or vomiting. Patient not taking: Reported on 03/27/2014 12/28/13   Margarita Mail, PA-C  pantoprazole (PROTONIX) 40 MG tablet Take 40 mg by mouth at bedtime.    Historical Provider, MD  phenazopyridine (PYRIDIUM) 200 MG tablet Take 1 tablet (200 mg total) by mouth 3 (three) times daily as needed (pain with urination). Patient not taking: Reported on 01/23/2014 12/28/13   Margarita Mail, PA-C  predniSONE (DELTASONE) 20 MG tablet Take 3 tablets (60 mg total) by mouth daily. Patient not taking: Reported on 05/11/2014 03/28/14   Everlene Balls, MD  QUEtiapine (SEROQUEL XR) 400 MG 24 hr tablet Take 800 mg by mouth at bedtime.    Historical Provider, MD  sertraline (ZOLOFT) 100 MG tablet Take 2 tablets (200 mg total) by mouth daily. Patient not taking: Reported on 05/11/2014 03/03/14   Wandra Arthurs, MD  sertraline (ZOLOFT) 50 MG tablet Take 150 mg by mouth at bedtime. 05/01/14   Historical Provider, MD  traMADol (ULTRAM) 50 MG tablet Take 1 tablet (50 mg total) by mouth every 6 (six) hours as needed. Patient not taking: Reported on 01/23/2014 12/28/13   Margarita Mail, PA-C   BP 149/91 mmHg  Pulse 102  Temp(Src) 98.4 F (36.9 C) (Oral)  Resp 18  Ht 5\' 4"  (1.626 m)  Wt 266 lb 6.4 oz (120.838 kg)  BMI 45.70 kg/m2  SpO2 97%   LMP 05/20/2014   Physical Exam  Constitutional: She is oriented to person, place, and time. She appears well-developed and well-nourished. No distress.  HENT:  Head: Normocephalic and atraumatic.  Mouth/Throat: Oropharynx is clear and moist.  Eyes: Conjunctivae and EOM are normal.  Neck: Normal range of motion. Neck supple.  Cardiovascular: Normal rate, regular rhythm and normal heart sounds.   Pulmonary/Chest: Effort normal and breath sounds normal. No respiratory distress.  Abdominal: Soft. She exhibits no distension. There is no tenderness. There is no rebound.  Musculoskeletal:  Normal range of motion. She exhibits no edema.  Neurological: She is alert and oriented to person, place, and time. No sensory deficit.  Skin: Skin is warm and dry.  Psychiatric: She has a normal mood and affect. Her behavior is normal.  Nursing note and vitals reviewed.   ED Course  Procedures (including critical care time) DIAGNOSTIC STUDIES: Oxygen Saturation is 97% on room air, normal by my interpretation.    COORDINATION OF CARE: 10:23 PM-Discussed treatment plan which includes Neurontin and Ativan prescriptions with pt at bedside and pt agreed to plan.   Labs Review Labs Reviewed - No data to display  Imaging Review No results found.   EKG Interpretation None      MDM   Final diagnoses:  Anxiety   Patient will have refill of neurontin and prescription for ativan. Vitals stable and patient afebrile.   I personally performed the services described in this documentation, which was scribed in my presence. The recorded information has been reviewed and is accurate.    Alvina Chou, PA-C 05/24/14 2258  Wandra Arthurs, MD 05/24/14 [phone]

## 2014-05-29 ENCOUNTER — Encounter (HOSPITAL_COMMUNITY): Payer: Self-pay | Admitting: Emergency Medicine

## 2014-05-29 ENCOUNTER — Emergency Department (HOSPITAL_COMMUNITY): Payer: Medicare Other

## 2014-05-29 ENCOUNTER — Emergency Department (HOSPITAL_COMMUNITY)
Admission: EM | Admit: 2014-05-29 | Discharge: 2014-05-30 | Disposition: A | Discharge status: Home / Self Care | Payer: Medicare Other | Attending: Emergency Medicine | Admitting: Emergency Medicine

## 2014-05-29 DIAGNOSIS — E119 Type 2 diabetes mellitus without complications: Secondary | ICD-10-CM | POA: Diagnosis not present

## 2014-05-29 DIAGNOSIS — R1031 Right lower quadrant pain: Secondary | ICD-10-CM | POA: Insufficient documentation

## 2014-05-29 DIAGNOSIS — Z79899 Other long term (current) drug therapy: Secondary | ICD-10-CM | POA: Diagnosis not present

## 2014-05-29 DIAGNOSIS — K760 Fatty (change of) liver, not elsewhere classified: Secondary | ICD-10-CM | POA: Diagnosis not present

## 2014-05-29 DIAGNOSIS — R197 Diarrhea, unspecified: Secondary | ICD-10-CM | POA: Diagnosis not present

## 2014-05-29 DIAGNOSIS — N939 Abnormal uterine and vaginal bleeding, unspecified: Secondary | ICD-10-CM | POA: Diagnosis not present

## 2014-05-29 DIAGNOSIS — F419 Anxiety disorder, unspecified: Secondary | ICD-10-CM | POA: Diagnosis not present

## 2014-05-29 DIAGNOSIS — E669 Obesity, unspecified: Secondary | ICD-10-CM | POA: Diagnosis not present

## 2014-05-29 DIAGNOSIS — Z85828 Personal history of other malignant neoplasm of skin: Secondary | ICD-10-CM | POA: Diagnosis not present

## 2014-05-29 DIAGNOSIS — F250 Schizoaffective disorder, bipolar type: Secondary | ICD-10-CM | POA: Diagnosis present

## 2014-05-29 DIAGNOSIS — R45851 Suicidal ideations: Secondary | ICD-10-CM

## 2014-05-29 DIAGNOSIS — R162 Hepatomegaly with splenomegaly, not elsewhere classified: Secondary | ICD-10-CM | POA: Diagnosis not present

## 2014-05-29 DIAGNOSIS — Z7952 Long term (current) use of systemic steroids: Secondary | ICD-10-CM | POA: Insufficient documentation

## 2014-05-29 DIAGNOSIS — Z792 Long term (current) use of antibiotics: Secondary | ICD-10-CM | POA: Insufficient documentation

## 2014-05-29 DIAGNOSIS — F319 Bipolar disorder, unspecified: Secondary | ICD-10-CM | POA: Insufficient documentation

## 2014-05-29 DIAGNOSIS — I10 Essential (primary) hypertension: Secondary | ICD-10-CM | POA: Diagnosis not present

## 2014-05-29 DIAGNOSIS — Z9049 Acquired absence of other specified parts of digestive tract: Secondary | ICD-10-CM | POA: Diagnosis not present

## 2014-05-29 DIAGNOSIS — Z9889 Other specified postprocedural states: Secondary | ICD-10-CM | POA: Diagnosis not present

## 2014-05-29 DIAGNOSIS — Z3202 Encounter for pregnancy test, result negative: Secondary | ICD-10-CM | POA: Diagnosis not present

## 2014-05-29 DIAGNOSIS — F1310 Sedative, hypnotic or anxiolytic abuse, uncomplicated: Secondary | ICD-10-CM | POA: Insufficient documentation

## 2014-05-29 DIAGNOSIS — R590 Localized enlarged lymph nodes: Secondary | ICD-10-CM | POA: Diagnosis not present

## 2014-05-29 LAB — CBC
HEMATOCRIT: 38.6 % (ref 36.0–46.0)
HEMOGLOBIN: 12.4 g/dL (ref 12.0–15.0)
MCH: 26.5 pg (ref 26.0–34.0)
MCHC: 32.1 g/dL (ref 30.0–36.0)
MCV: 82.5 fL (ref 78.0–100.0)
Platelets: 209 10*3/uL (ref 150–400)
RBC: 4.68 MIL/uL (ref 3.87–5.11)
RDW: 15.1 % (ref 11.5–15.5)
WBC: 6.3 10*3/uL (ref 4.0–10.5)

## 2014-05-29 LAB — COMPREHENSIVE METABOLIC PANEL
ALT: 31 U/L (ref 14–54)
AST: 49 U/L — ABNORMAL HIGH (ref 15–41)
Albumin: 4.2 g/dL (ref 3.5–5.0)
Alkaline Phosphatase: 119 U/L (ref 38–126)
Anion gap: 11 (ref 5–15)
BUN: 8 mg/dL (ref 6–20)
CHLORIDE: 104 mmol/L (ref 101–111)
CO2: 24 mmol/L (ref 22–32)
Calcium: 9 mg/dL (ref 8.9–10.3)
Creatinine, Ser: 0.55 mg/dL (ref 0.44–1.00)
Glucose, Bld: 146 mg/dL — ABNORMAL HIGH (ref 70–99)
POTASSIUM: 4 mmol/L (ref 3.5–5.1)
SODIUM: 139 mmol/L (ref 135–145)
TOTAL PROTEIN: 7.9 g/dL (ref 6.5–8.1)
Total Bilirubin: 0.6 mg/dL (ref 0.3–1.2)

## 2014-05-29 LAB — ACETAMINOPHEN LEVEL: Acetaminophen (Tylenol), Serum: <10 ug/mL — ABNORMAL LOW (ref 10–30)

## 2014-05-29 LAB — I-STAT BETA HCG BLOOD, ED (MC, WL, AP ONLY): I-stat hCG, quantitative: <5 m[IU]/mL (ref <5)

## 2014-05-29 LAB — ETHANOL: Alcohol, Ethyl (B): <5 mg/dL (ref <5)

## 2014-05-29 LAB — SALICYLATE LEVEL

## 2014-05-29 MED ORDER — ZOLPIDEM TARTRATE 5 MG PO TABS
5.0000 mg | ORAL_TABLET | Freq: Every evening | ORAL | Status: DC | PRN
  Administered 2014-05-30: 5 mg via ORAL
  Filled 2014-05-29: qty 1

## 2014-05-29 MED ORDER — LORAZEPAM 1 MG PO TABS
1.0000 mg | ORAL_TABLET | Freq: Three times a day (TID) | ORAL | Status: DC | PRN
  Administered 2014-05-30 (×2): 1 mg via ORAL
  Filled 2014-05-29 (×2): qty 1

## 2014-05-29 MED ORDER — ONDANSETRON HCL 4 MG PO TABS: 4.0000 mg | ORAL_TABLET | Freq: Three times a day (TID) | ORAL | Status: DC | PRN

## 2014-05-29 MED ORDER — IOHEXOL 300 MG/ML  SOLN
100.0000 mL | Freq: Once | INTRAMUSCULAR | Status: AC | PRN
  Administered 2014-05-29: 100 mL via INTRAVENOUS

## 2014-05-29 MED ORDER — NICOTINE 21 MG/24HR TD PT24: 21.0000 mg | MEDICATED_PATCH | Freq: Every day | TRANSDERMAL | Status: DC

## 2014-05-29 NOTE — ED Notes (Signed)
Pt attempting to leave, security called. Pt states she wanted to go the bathroom. Pt locks door to bathroom and starts throwing urine cups and holding the door.

## 2014-05-29 NOTE — ED Provider Notes (Addendum)
CSN: [id number]     Arrival date & time 05/29/14  1843 History   None   This chart was scribed for Pattricia Boss, MD by Terressa Koyanagi, ED Scribe. This patient was seen in room [facility] and the patient's care was started at 7:44 PM.  Chief Complaint  Patient presents with  . Suicidal  . Abdominal Pain   The history is provided by the patient. No language interpreter was used.    HPI Comments: Tina Saunders is a 29 y.o. female, brought in by police, arriving from [facility], with [place] noted below including depression and DM, who presents to the Emergency Department complaining of SI and auditory hallucinations. Pt also complains of abd pain with associated nausea onset this morning. Pt denies vomiting, pregnancy, or drug abuse. Pt reports taking multiple daily meds.   Past Medical History  Diagnosis Date  . Polycystic ovarian syndrome 07/01/2011    Patient report  . Anxiety   . Depression   . Hypertension   . Obesity   . Schizophrenia   . Bipolar 1 disorder   . Obesity   . Cancer of abdominal wall   . Rhabdosarcoma   . Diabetes mellitus without complication    Past Surgical History  Procedure Laterality Date  . Cholecystectomy    . Varicose vein surgery    . Ovarian cyst excision    . Hernia repair     Family History  Problem Relation Age of Onset  . Coronary artery disease Maternal Grandmother   . Diabetes type II Maternal Grandmother   . Cancer Maternal Grandmother   . Hypertension Mother   . Hypertension Father    History  Substance Use Topics  . Smoking status: Never Smoker   . Smokeless tobacco: Never Used  . Alcohol Use: No   OB History    Gravida Para Term Preterm AB TAB SAB Ectopic Multiple Living   0              Review of Systems  Constitutional: Negative for fever and chills.  Gastrointestinal: Positive for nausea and abdominal pain.  Neurological: Negative for speech difficulty.  Psychiatric/Behavioral: Positive for suicidal ideas and hallucinations.   All other systems reviewed and are negative.     Allergies  Fish-derived products; Geodon; Haldol; Compazine; Morphine and related; Toradol; and Buprenorphine hcl  Home Medications   Prior to Admission medications   Medication Sig Start Date End Date Taking? Authorizing Provider  busPIRone (BUSPAR) 15 MG tablet Take 15 mg by mouth 3 (three) times daily.    Historical Provider, MD  cephALEXin (KEFLEX) 500 MG capsule Take 1 capsule (500 mg total) by mouth 3 (three) times daily. Patient not taking: Reported on 01/23/2014 12/28/13   Margarita Mail, PA-C  ciprofloxacin (CIPRO) 500 MG tablet Take 1 tablet (500 mg total) by mouth 2 (two) times daily. One po bid x 7 days 05/11/14   Mercedes Camprubi-Soms, PA-C  clonazePAM (KLONOPIN) 0.5 MG tablet Take 1 tablet (0.5 mg total) by mouth 2 (two) times daily. Patient not taking: Reported on 05/11/2014 03/03/14   Wandra Arthurs, MD  gabapentin (NEURONTIN) 400 MG capsule Take 2 capsules (800 mg total) by mouth at bedtime. 05/24/14   Alvina Chou, PA-C  HYDROcodone-acetaminophen (NORCO) 5-325 MG per tablet Take 1 tablet by mouth every 6 (six) hours as needed for severe pain. 05/11/14   Mercedes Camprubi-Soms, PA-C  LORazepam (ATIVAN) 1 MG tablet Take 1 tablet (1 mg total) by mouth 3 (three) times daily  as needed for anxiety. 05/24/14   Kaitlyn Szekalski, PA-C  Melatonin 3 MG TABS Take 3 mg by mouth at bedtime.     Historical Provider, MD  metFORMIN (GLUMETZA) 1000 MG (MOD) 24 hr tablet Take 1,000 mg by mouth at bedtime.    Historical Provider, MD  metoprolol succinate (TOPROL-XL) 25 MG 24 hr tablet Take 25 mg by mouth at bedtime.     Historical Provider, MD  metroNIDAZOLE (FLAGYL) 500 MG tablet Take 1 tablet (500 mg total) by mouth 3 (three) times daily. One po tid x 7 days 05/11/14   Mercedes Camprubi-Soms, PA-C  naproxen (NAPROSYN) 500 MG tablet Take 1 tablet (500 mg total) by mouth 2 (two) times daily as needed for mild pain, moderate pain or headache (TAKE WITH  MEALS.). 05/11/14   Mercedes Camprubi-Soms, PA-C  ondansetron (ZOFRAN ODT) 4 MG disintegrating tablet 4mg  ODT q4 hours prn nausea/vomit Patient not taking: Reported on 03/27/2014 02/10/14   Marissa Sciacca, PA-C  ondansetron (ZOFRAN ODT) 4 MG disintegrating tablet Take 1 tablet (4 mg total) by mouth every 8 (eight) hours as needed for nausea or vomiting. 05/11/14   Mercedes Camprubi-Soms, PA-C  ondansetron (ZOFRAN) 4 MG tablet Take 1 tablet (4 mg total) by mouth every 8 (eight) hours as needed for nausea or vomiting. Patient not taking: Reported on 03/27/2014 12/28/13   Margarita Mail, PA-C  pantoprazole (PROTONIX) 40 MG tablet Take 40 mg by mouth at bedtime.    Historical Provider, MD  phenazopyridine (PYRIDIUM) 200 MG tablet Take 1 tablet (200 mg total) by mouth 3 (three) times daily as needed (pain with urination). Patient not taking: Reported on 01/23/2014 12/28/13   Margarita Mail, PA-C  predniSONE (DELTASONE) 20 MG tablet Take 3 tablets (60 mg total) by mouth daily. Patient not taking: Reported on 05/11/2014 03/28/14   Everlene Balls, MD  QUEtiapine (SEROQUEL XR) 400 MG 24 hr tablet Take 800 mg by mouth at bedtime.    Historical Provider, MD  sertraline (ZOLOFT) 100 MG tablet Take 2 tablets (200 mg total) by mouth daily. Patient not taking: Reported on 05/11/2014 03/03/14   Wandra Arthurs, MD  sertraline (ZOLOFT) 50 MG tablet Take 150 mg by mouth at bedtime. 05/01/14   Historical Provider, MD  traMADol (ULTRAM) 50 MG tablet Take 1 tablet (50 mg total) by mouth every 6 (six) hours as needed. Patient not taking: Reported on 01/23/2014 12/28/13   Margarita Mail, PA-C   Triage Vitals: BP 150/117 mmHg  Pulse 94  Temp(Src) 97.7 F (36.5 C) (Oral)  Resp 16  SpO2 96%  LMP 05/20/2014 Physical Exam  Constitutional: She is oriented to person, place, and time. She appears well-developed and well-nourished. No distress.  HENT:  Head: Normocephalic and atraumatic.  Right Ear: External ear normal.  Left Ear: External ear  normal.  Nose: Nose normal.  Eyes: Conjunctivae and EOM are normal. Pupils are equal, round, and reactive to light.  Neck: Normal range of motion. Neck supple.  Cardiovascular: Normal rate and regular rhythm.   Pulmonary/Chest: Effort normal and breath sounds normal.  Abdominal: Soft. There is tenderness.  Genitourinary: Vaginal discharge found.  Vaginal bleeding consistent sit with menstrual cycle : Exam done bimanual exam without any abnormalities   Musculoskeletal: Normal range of motion.  Neurological: She is alert and oriented to person, place, and time. She exhibits normal muscle tone. Coordination normal.  Skin: Skin is warm and dry.  Psychiatric: She has a normal mood and affect. Her behavior is normal. Thought content  normal.  Nursing note and vitals reviewed.   ED Course  Procedures (including critical care time) DIAGNOSTIC STUDIES: Oxygen Saturation is 96% on RA, adequate by my interpretation.    COORDINATION OF CARE: 7:43 PM-Discussed treatment plan which includes telepsych with pt at bedside and pt agreed to plan.  9:17 PM Patient denies activity or abnormal vaginal discharge. Patient is having vaginal bleeding and no swabs were sent. Labs Review Labs Reviewed  ACETAMINOPHEN LEVEL - Abnormal; Notable for the following:    Acetaminophen (Tylenol), Serum <10 (*)    All other components within normal limits  COMPREHENSIVE METABOLIC PANEL - Abnormal; Notable for the following:    Glucose, Bld 146 (*)    AST 49 (*)    All other components within normal limits  CBC  ETHANOL  SALICYLATE LEVEL  URINE RAPID DRUG SCREEN (HOSP PERFORMED)  I-STAT BETA HCG BLOOD, ED ([facility], [facility], [facility] ONLY)    Imaging Review Ct Abdomen Pelvis W Contrast  05/29/2014   CLINICAL DATA:  Right lower quadrant abdominal pain since this morning with diarrhea.  EXAM: CT ABDOMEN AND PELVIS WITH CONTRAST  TECHNIQUE: Multidetector CT imaging of the abdomen and pelvis was performed using the standard protocol  following bolus administration of intravenous contrast.  CONTRAST:  153mL OMNIPAQUE IOHEXOL 300 MG/ML  SOLN  COMPARISON:  05/11/2014  FINDINGS: There is a cluster of small nodules peripherally in the posterior right lower lobe. Minimal atelectasis is present in the left lung base. There is no pleural effusion.  Hepatomegaly is similar to the prior study. Prominent hepatic steatosis is again seen. The gallbladder is surgically absent. No biliary dilatation is seen. The spleen remains enlarged, similar to the prior study. There is a new 2.9 cm peripheral, wedge-shaped focus of hypodensity posteriorly in the spleen. The adrenal glands, kidneys, and pancreas have an unremarkable enhanced appearance.  The small and large bowel are nondilated without evidence of obstruction. No bowel wall thickening is seen. Appendix is identified in the right lower quadrant and is unremarkable. A few small right lower quadrant lymph nodes measure up to 7 mm in short axis, unchanged. No enlarged retroperitoneal lymph nodes are identified. There is no free fluid.  Bladder is unremarkable. Uterus and ovaries are identified. No pelvic mass is seen. Small fat containing lower midline ventral abdominal hernia is unchanged. No acute osseous abnormality is seen.  IMPRESSION: 1. Normal appendix. 2. Unchanged, small right lower quadrant mesenteric lymph nodes, nonspecific but can be seen with mesenteric adenitis. 3. New 2.9 cm focus of low density peripherally in the spleen, most consistent with a splenic infarct. 4. Hepatosplenomegaly and hepatic steatosis.   Electronically Signed   By: Logan Bores   On: 05/29/2014 23:10     EKG Interpretation None      MDM  Pt is clear for telepsych.  Patient without source for rlq pain.  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 THis is not symptomatic and she is eating and without upper abdominal pain.   She will need rheumatology work up as op.   Treatment would most likely be with  anticoagulant and she is not candidate until psychiatrically stable.   I personally performed the services described in this documentation, which was scribed in my presence. The recorded information has been reviewed and considered.   Pattricia Boss, MD 05/29/14 1194  Pattricia Boss, MD 05/29/14 [phone]

## 2014-05-29 NOTE — ED Notes (Signed)
Pt also reporting RLQ abd pain since this am with diarrhea.

## 2014-05-29 NOTE — ED Notes (Signed)
Patient states she is unable to feel when she has to use bathroom. Patient is wearing a brief. Tried to use restroom but was unable

## 2014-05-29 NOTE — ED Notes (Signed)
Walked pt to bathroom. Pt locked self in bathroom and preceded to barricade self against bathroom door. [employer] and security present. Staff able to unlock bathroom door. Pt then began to push bathroom door against [employer], security and staff in an attempt to lock herself again in the bathroom. [employer] escorted pt out of bathroom back to bed. Agricultural consultant notified

## 2014-05-29 NOTE — ED Notes (Signed)
Pt IVC from [facility]. Pt per IVC papers pt has been having hallucinations to kill herself by cutting herself. Hx of schizophrenia, bipolar and depression. Denies substance abuse.

## 2014-05-29 NOTE — ED Notes (Signed)
Nurse is currently starting IV will get labs

## 2014-05-30 DIAGNOSIS — F250 Schizoaffective disorder, bipolar type: Secondary | ICD-10-CM

## 2014-05-30 DIAGNOSIS — R45851 Suicidal ideations: Secondary | ICD-10-CM | POA: Insufficient documentation

## 2014-05-30 LAB — RAPID URINE DRUG SCREEN, HOSP PERFORMED
AMPHETAMINES: NOT DETECTED
BARBITURATES: NOT DETECTED
BENZODIAZEPINES: POSITIVE — AB
COCAINE: NOT DETECTED
Opiates: NOT DETECTED
TETRAHYDROCANNABINOL: NOT DETECTED

## 2014-05-30 MED ORDER — QUETIAPINE FUMARATE 300 MG PO TABS
400.0000 mg | ORAL_TABLET | Freq: Once | ORAL | Status: AC
  Administered 2014-05-30: 400 mg via ORAL
  Filled 2014-05-30 (×2): qty 1

## 2014-05-30 MED ORDER — BUSPIRONE HCL 10 MG PO TABS
15.0000 mg | ORAL_TABLET | Freq: Once | ORAL | Status: AC
  Administered 2014-05-30: 15 mg via ORAL
  Filled 2014-05-30: qty 2

## 2014-05-30 MED ORDER — SERTRALINE HCL 50 MG PO TABS
50.0000 mg | ORAL_TABLET | Freq: Once | ORAL | Status: AC
  Administered 2014-05-30: 50 mg via ORAL
  Filled 2014-05-30: qty 1

## 2014-05-30 MED ORDER — SERTRALINE HCL 50 MG PO TABS
100.0000 mg | ORAL_TABLET | Freq: Every day | ORAL | Status: DC
  Administered 2014-05-30 (×2): 100 mg via ORAL
  Filled 2014-05-30 (×2): qty 2

## 2014-05-30 MED ORDER — SERTRALINE HCL 50 MG PO TABS: 150.0000 mg | ORAL_TABLET | Freq: Every day | ORAL | Status: DC

## 2014-05-30 MED ORDER — GABAPENTIN 400 MG PO CAPS
400.0000 mg | ORAL_CAPSULE | Freq: Every day | ORAL | Status: DC
  Filled 2014-05-30: qty 1

## 2014-05-30 MED ORDER — QUETIAPINE FUMARATE 300 MG PO TABS
400.0000 mg | ORAL_TABLET | Freq: Every day | ORAL | Status: DC
  Administered 2014-05-30: 400 mg via ORAL
  Filled 2014-05-30 (×2): qty 1

## 2014-05-30 MED ORDER — QUETIAPINE FUMARATE 300 MG PO TABS: 800.0000 mg | ORAL_TABLET | Freq: Every day | ORAL | Status: DC

## 2014-05-30 MED ORDER — GABAPENTIN 400 MG PO CAPS
800.0000 mg | ORAL_CAPSULE | Freq: Every day | ORAL | Status: DC
  Administered 2014-05-30: 800 mg via ORAL

## 2014-05-30 MED ORDER — GABAPENTIN 400 MG PO CAPS: 400.0000 mg | ORAL_CAPSULE | Freq: Every day | ORAL | Status: DC

## 2014-05-30 NOTE — [facility] Counselor (Signed)
Counselor faxed out supporting documentation to the following facilities to obtain placement for pt:  [facility]  *This information was updated on the shift report*  Redmond Pulling, MA OBS Counselor

## 2014-05-30 NOTE — ED Notes (Signed)
Patient up taking shower.
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
## 2014-05-30 NOTE — Consult Note (Signed)
[place] [facility] Consult   Reason for Consult:  Hallucinations Referring Physician:  EDP Patient Identification: Tina Saunders MRN:  [id number] Principal Diagnosis: Schizoaffective disorder, bipolar type Diagnosis:   Patient Active Problem List   Diagnosis Date Noted  . Schizoaffective disorder, bipolar type [F25.0] 05/30/2014    Priority: High  . 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 Headache [R51] 04/15/2013  . HTN (hypertension) [I10] 04/15/2013  . Bipolar disorder, current episode depressed, severe, with psychotic features [F31.5] 12/07/2011  . Post traumatic stress disorder [F43.10] 12/07/2011  . CAP (community acquired pneumonia) [J18.9] 08/27/2011  . Chest pain [R07.9] 08/26/2011  . SOB (shortness of breath) [R06.02] 08/26/2011  . Fever [R50.9] 08/26/2011  . Hypokalemia [E87.6] 08/26/2011  . PSVT (paroxysmal supraventricular tachycardia) [I47.1] 08/26/2011  . ADHD [F90.9] 09/23/2007  . EPIGASTRIC PAIN [R10.13] 09/23/2007  . Obesity, unspecified [E66.9] 07/30/2007  . DEPRESSION [F32.9] 07/30/2007  . SLEEP DISORDER [G47.9] 07/30/2007  . IMPAIRED FASTING GLUCOSE [R73.01] 07/30/2007  . FATIGUE [R53.81, R53.83] 11/21/2006  . ABNORMAL FINDINGS, ELEVATED BP W/O HTN [R03.0] 11/21/2006  . METRORRHAGIA [N92.1] 06/13/2006  . DISORDER, MENSTRUAL NEC [N94.9] 06/13/2006  . DIZZINESS [R42] 06/13/2006  . POLYCYSTIC OVARIAN DISEASE [E28.2] 04/25/2006  . AMENORRHEA, SECONDARY [N91.2] 04/20/2006  . ACNE, MILD [L70.8] 04/20/2006  . ABDOMINAL PAIN [R10.9] 04/20/2006    Total Time spent with patient: 45 minutes  Subjective:   Tina Saunders is a 29 y.o. female patient does not warrant admission.  HPI:  The patient had legal charges yesterday and reports she was told to seek help to try and get the charges dropped.  So, the patient went to [facility] and told them she was hearing voices telling her to kill herself.  [facility] IVC'd her and sent her to the ED.  She is  calm, cooperative, and relaxed in the ED.  Santa states she does not hear voices today or have visual hallucinations.  Denies suicidal/homicidal ideations and alcohol/drug abuse.  On assessment, Erline states she was upset yesterday at her mother and told people she was hearing voices to kill herself.  She requests to leave and her mother was called (who she lives with) to see if she had any concerns about her daughter, she stated no Tina Saunders).  Stable for discharge. HPI Elements:   Location:  generalized. Quality:  acute. Severity:  mild. Timing:  intermittent. Duration:  brief. Context:  stressors.  Past Medical History:  Past Medical History  Diagnosis Date  . Polycystic ovarian syndrome 07/01/2011    Patient report  . Anxiety   . Depression   . Hypertension   . Obesity   . Schizophrenia   . Bipolar 1 disorder   . Obesity   . Cancer of abdominal wall   . Rhabdosarcoma   . Diabetes mellitus without complication     Past Surgical History  Procedure Laterality Date  . Cholecystectomy    . Varicose vein surgery    . Ovarian cyst excision    . Hernia repair     Family History:  Family History  Problem Relation Age of Onset  . Coronary artery disease Maternal Grandmother   . Diabetes type II Maternal Grandmother   . Cancer Maternal Grandmother   . Hypertension Mother   . Hypertension Father    Social History:  History  Alcohol Use No     History  Drug Use No    History   Social History  . Marital Status: Single  Spouse Name: N/A  . Number of Children: N/A  . Years of Education: N/A   Social History Main Topics  . Smoking status: Never Smoker   . Smokeless tobacco: Never Used  . Alcohol Use: No  . Drug Use: No  . Sexual Activity: No   Other Topics Concern  . None   Social History Narrative   Additional Social History:    History of alcohol / drug use?: No history of alcohol / drug abuse                     Allergies:   Allergies  Allergen  Reactions  . Fish-Derived Products Anaphylaxis    Can only eat FLounder  . Geodon [Ziprasidone Hcl] Other (See Comments)    Face pulls, cant swallow - Locked Jaw  . Haldol [Haloperidol Lactate] Other (See Comments)    Face pulls, can't swallow - Locked Jaw  . Compazine [Prochlorperazine] Other (See Comments)    anxiety and hyperactivity  . Toradol [Ketorolac Tromethamine] Other (See Comments)    Anxiety and hyperactivity  . Buprenorphine Hcl Hives, Itching and Rash    GI upset    Labs:  Results for orders placed or performed during the hospital encounter of 05/29/14 (from the past 48 hour(s))  Acetaminophen level     Status: Abnormal   Collection Time: 05/29/14  7:44 PM  Result Value Ref Range   Acetaminophen (Tylenol), Serum <10 (L) 10 - 30 ug/mL    Comment:        THERAPEUTIC CONCENTRATIONS VARY SIGNIFICANTLY. A RANGE OF 10-30 ug/mL MAY BE AN EFFECTIVE CONCENTRATION FOR MANY PATIENTS. HOWEVER, SOME ARE BEST TREATED AT CONCENTRATIONS OUTSIDE THIS RANGE. ACETAMINOPHEN CONCENTRATIONS >150 ug/mL AT 4 HOURS AFTER INGESTION AND >50 ug/mL AT 12 HOURS AFTER INGESTION ARE OFTEN ASSOCIATED WITH TOXIC REACTIONS.   CBC     Status: None   Collection Time: 05/29/14  7:44 PM  Result Value Ref Range   WBC 6.3 4.0 - 10.5 K/uL   RBC 4.68 3.87 - 5.11 MIL/uL   Hemoglobin 12.4 12.0 - 15.0 g/dL   HCT 38.6 36.0 - 46.0 %   MCV 82.5 78.0 - 100.0 fL   MCH 26.5 26.0 - 34.0 pg   MCHC 32.1 30.0 - 36.0 g/dL   RDW 15.1 11.5 - 15.5 %   Platelets 209 150 - 400 K/uL  Comprehensive metabolic panel     Status: Abnormal   Collection Time: 05/29/14  7:44 PM  Result Value Ref Range   Sodium 139 135 - 145 mmol/L   Potassium 4.0 3.5 - 5.1 mmol/L   Chloride 104 101 - 111 mmol/L   CO2 24 22 - 32 mmol/L   Glucose, Bld 146 (H) 70 - 99 mg/dL   BUN 8 6 - 20 mg/dL   Creatinine, Ser 0.55 0.44 - 1.00 mg/dL   Calcium 9.0 8.9 - 10.3 mg/dL   Total Protein 7.9 6.5 - 8.1 g/dL   Albumin 4.2 3.5 - 5.0 g/dL    AST 49 (H) 15 - 41 U/L   ALT 31 14 - 54 U/L   Alkaline Phosphatase 119 38 - 126 U/L   Total Bilirubin 0.6 0.3 - 1.2 mg/dL   GFR calc non Af Amer >60 >60 mL/min   GFR calc Af Amer >60 >60 mL/min    Comment: (NOTE) The eGFR has been calculated using the CKD EPI equation. This calculation has not been validated in all clinical situations. eGFR's persistently <60 mL/min  signify possible Chronic Kidney Disease.    Anion gap 11 5 - 15  Ethanol (ETOH)     Status: None   Collection Time: 05/29/14  7:44 PM  Result Value Ref Range   Alcohol, Ethyl (B) <5 <5 mg/dL    Comment:        LOWEST DETECTABLE LIMIT FOR SERUM ALCOHOL IS 11 mg/dL FOR MEDICAL PURPOSES ONLY   Salicylate level     Status: None   Collection Time: 05/29/14  7:44 PM  Result Value Ref Range   Salicylate Lvl <7.3 2.8 - 30.0 mg/dL  I-Stat Beta hCG blood, ED ([facility], [facility], [facility] only)     Status: None   Collection Time: 05/29/14 10:11 PM  Result Value Ref Range   I-stat hCG, quantitative <5.0 <5 mIU/mL   Comment 3            Comment:   GEST. AGE      CONC.  (mIU/mL)   <=1 WEEK        5 - 50     2 WEEKS       50 - 500     3 WEEKS       100 - 10,000     4 WEEKS     1,000 - 30,000        FEMALE AND NON-PREGNANT FEMALE:     LESS THAN 5 mIU/mL   Urine Drug Screen     Status: Abnormal   Collection Time: 05/30/14 12:18 AM  Result Value Ref Range   Opiates NONE DETECTED NONE DETECTED   Cocaine NONE DETECTED NONE DETECTED   Benzodiazepines POSITIVE (A) NONE DETECTED   Amphetamines NONE DETECTED NONE DETECTED   Tetrahydrocannabinol NONE DETECTED NONE DETECTED   Barbiturates NONE DETECTED NONE DETECTED    Comment:        DRUG SCREEN FOR MEDICAL PURPOSES ONLY.  IF CONFIRMATION IS NEEDED FOR ANY PURPOSE, NOTIFY LAB WITHIN 5 DAYS.        LOWEST DETECTABLE LIMITS FOR URINE DRUG SCREEN Drug Class       Cutoff (ng/mL) Amphetamine      1000 Barbiturate      200 Benzodiazepine   428 Tricyclics       768 Opiates          300 Cocaine           300 THC              50     Vitals: Blood pressure 144/93, pulse 102, temperature 98.5 F (36.9 C), temperature source Oral, resp. rate 20, last menstrual period 05/20/2014, SpO2 95 %.  Risk to Self: Suicidal Ideation: No-Not Currently/Within Last 6 Months Suicidal Intent: No-Not Currently/Within Last 6 Months Is patient at risk for suicide?: Yes Suicidal Plan?: No-Not Currently/Within Last 6 Months Access to Means: Yes Specify Access to Suicidal Means: Pt has access to knives What has been your use of drugs/alcohol within the last 12 months?: No drugs or alcohol abuse reported.  How many times?: 3 ("2-3") Other Self Harm Risks: Cutting  Triggers for Past Attempts: Unpredictable Intentional Self Injurious Behavior: Cutting Comment - Self Injurious Behavior: Pt reported a history of cutting. No recent cuts reported.  Risk to Others: Homicidal Ideation: No Thoughts of Harm to Others: No Current Homicidal Intent: No Current Homicidal Plan: No Access to Homicidal Means: No Identified Victim: NA History of harm to others?: No Assessment of Violence: On admission Violent Behavior Description: No violent behaviors observed. Pt  is calm and cooperative at this time.  Does patient have access to weapons?: No Criminal Charges Pending?: Yes Describe Pending Criminal Charges: "Shoplifting"  Does patient have a court date: Yes Court Date: 06/01/14 (06/02/14: 06/05/14) Prior Inpatient Therapy: Prior Inpatient Therapy: Yes Prior Therapy Dates: 201, 2015 Prior Therapy Facilty/Provider(s): [place], [place] Reason for Treatment: Suicide attempts  Prior Outpatient Therapy: Prior Outpatient Therapy: Yes Prior Therapy Dates: Present  Prior Therapy Facilty/Provider(s): [facility]  Reason for Treatment: Schizo-affective; bipolar type  Does patient have an ACCT team?: Unknown Does patient have Intensive In-House Services?  : No Does patient have [facility] services? : No Does patient have [facility]  services?: No  Current Facility-Administered Medications  Medication Dose Route Frequency Provider Last Rate Last Dose  . gabapentin (NEURONTIN) capsule 800 mg  800 mg Oral QHS Merryl Hacker, MD   800 mg at 05/30/14 0230  . LORazepam (ATIVAN) tablet 1 mg  1 mg Oral Q8H PRN Pattricia Boss, MD   1 mg at 05/30/14 1115  . nicotine (NICODERM CQ - dosed in mg/24 hours) patch 21 mg  21 mg Transdermal Daily Pattricia Boss, MD   21 mg at 05/30/14 1016  . ondansetron (ZOFRAN) tablet 4 mg  4 mg Oral Q8H PRN Pattricia Boss, MD      . QUEtiapine (SEROQUEL) tablet 400 mg  400 mg Oral Once Patrecia Pour, NP      . QUEtiapine (SEROQUEL) tablet 800 mg  800 mg Oral QHS Patrecia Pour, NP      . Derrill Memo ON 05/31/2014] sertraline (ZOLOFT) tablet 150 mg  150 mg Oral Daily Patrecia Pour, NP      . sertraline (ZOLOFT) tablet 50 mg  50 mg Oral Once Patrecia Pour, NP      . zolpidem (AMBIEN) tablet 5 mg  5 mg Oral QHS PRN Pattricia Boss, MD   5 mg at 05/30/14 0103   Current Outpatient Prescriptions  Medication Sig Dispense Refill  . busPIRone (BUSPAR) 15 MG tablet Take 15 mg by mouth 3 (three) times daily.    . clonazePAM (KLONOPIN) 0.5 MG tablet Take 1 tablet (0.5 mg total) by mouth 2 (two) times daily. 15 tablet 0  . gabapentin (NEURONTIN) 400 MG capsule Take 2 capsules (800 mg total) by mouth at bedtime. 60 capsule 0  . HYDROcodone-acetaminophen (NORCO) 5-325 MG per tablet Take 1 tablet by mouth every 6 (six) hours as needed for severe pain. 10 tablet 0  . LORazepam (ATIVAN) 1 MG tablet Take 1 tablet (1 mg total) by mouth 3 (three) times daily as needed for anxiety. 15 tablet 0  . Melatonin 3 MG TABS Take 3 mg by mouth at bedtime.     . metFORMIN (GLUMETZA) 1000 MG (MOD) 24 hr tablet Take 1,000 mg by mouth daily with breakfast.     . metoprolol succinate (TOPROL-XL) 25 MG 24 hr tablet Take 25 mg by mouth at bedtime.     . ondansetron (ZOFRAN ODT) 4 MG disintegrating tablet Take 1 tablet (4 mg total) by mouth every 8  (eight) hours as needed for nausea or vomiting. 15 tablet 0  . pantoprazole (PROTONIX) 40 MG tablet Take 40 mg by mouth at bedtime.    Marland Kitchen QUEtiapine (SEROQUEL XR) 400 MG 24 hr tablet Take 800 mg by mouth at bedtime.    . sertraline (ZOLOFT) 100 MG tablet Take 2 tablets (200 mg total) by mouth daily. (Patient taking differently: Take 150 mg by mouth at bedtime. ) 20  tablet 0  . sertraline (ZOLOFT) 50 MG tablet Take 150 mg by mouth at bedtime.  1  . cephALEXin (KEFLEX) 500 MG capsule Take 1 capsule (500 mg total) by mouth 3 (three) times daily. (Patient not taking: Reported on 01/23/2014) 21 capsule 0  . ciprofloxacin (CIPRO) 500 MG tablet Take 1 tablet (500 mg total) by mouth 2 (two) times daily. One po bid x 7 days (Patient not taking: Reported on 05/29/2014) 14 tablet 0  . metroNIDAZOLE (FLAGYL) 500 MG tablet Take 1 tablet (500 mg total) by mouth 3 (three) times daily. One po tid x 7 days (Patient not taking: Reported on 05/29/2014) 21 tablet 0  . naproxen (NAPROSYN) 500 MG tablet Take 1 tablet (500 mg total) by mouth 2 (two) times daily as needed for mild pain, moderate pain or headache (TAKE WITH MEALS.). (Patient not taking: Reported on 05/29/2014) 20 tablet 0  . ondansetron (ZOFRAN) 4 MG tablet Take 1 tablet (4 mg total) by mouth every 8 (eight) hours as needed for nausea or vomiting. (Patient not taking: Reported on 03/27/2014) 10 tablet 0  . phenazopyridine (PYRIDIUM) 200 MG tablet Take 1 tablet (200 mg total) by mouth 3 (three) times daily as needed (pain with urination). (Patient not taking: Reported on 01/23/2014) 6 tablet 0  . predniSONE (DELTASONE) 20 MG tablet Take 3 tablets (60 mg total) by mouth daily. (Patient not taking: Reported on 05/11/2014) 12 tablet 0  . traMADol (ULTRAM) 50 MG tablet Take 1 tablet (50 mg total) by mouth every 6 (six) hours as needed. (Patient not taking: Reported on 01/23/2014) 15 tablet 0    Musculoskeletal: Strength & Muscle Tone: within normal limits Gait & Station:  normal Patient leans: N/A  Psychiatric Specialty Exam:     Blood pressure 144/93, pulse 102, temperature 98.5 F (36.9 C), temperature source Oral, resp. rate 20, last menstrual period 05/20/2014, SpO2 95 %.There is no weight on file to calculate BMI.  General Appearance: Casual  Eye Contact::  Good  Speech:  Normal Rate  Volume:  Normal  Mood:  Euthymic  Affect:  Congruent  Thought Process:  Coherent  Orientation:  Full (Time, Place, and Person)  Thought Content:  WDL  Suicidal Thoughts:  No  Homicidal Thoughts:  No  Memory:  Immediate;   Good Recent;   Good Remote;   Good  Judgement:  Fair  Insight:  Good  Psychomotor Activity:  Normal  Concentration:  Good  Recall:  Good  Fund of Knowledge:Fair  Language: Good  Akathisia:  No  Handed:  Right  AIMS (if indicated):     Assets:  Catering manager Housing Leisure Time Physical Health Resilience Social Support  ADL's:  Intact  Cognition: Impaired,  Mild  Sleep:      Medical Decision Making: Review of Psycho-Social Stressors (1), Review or order clinical lab tests (1) and Review of Medication Regimen & Side Effects (2)  Treatment Plan Summary: Daily contact with patient to assess and evaluate symptoms and progress in treatment, Medication management and Plan discharge home to mother and follow-up with [facility], her regular providers  Plan:  No evidence of imminent risk to self or others at present.   Disposition: discharge home to mother and follow-up with [facility], her regular providers  Waylan Boga, [place] 05/30/2014 12:30 PM Patient seen face-to-face for psychiatric evaluation, chart reviewed and case discussed with the physician extender and developed treatment plan. Reviewed the information documented and agree with the treatment plan. Corena Pilgrim, MD

## 2014-05-30 NOTE — ED Notes (Signed)
Patient requested something for anxiety.  Ativan given per prn order.

## 2014-05-30 NOTE — ED Notes (Signed)
Patient incontinent of urine in bed.  Psychiatrist and nurse practitioner at bedside.

## 2014-05-30 NOTE — [facility] Suicide Risk Assessment (Signed)
Suicide Risk Assessment  Discharge Assessment   [facility] Discharge Suicide Risk Assessment   Demographic Factors:  Caucasian  Total Time spent with patient: 45 minutes   Musculoskeletal: Strength & Muscle Tone: within normal limits Gait & Station: normal Patient leans: N/A  Psychiatric Specialty Exam:     Blood pressure 144/93, pulse 102, temperature 98.5 F (36.9 C), temperature source Oral, resp. rate 20, last menstrual period 05/20/2014, SpO2 95 %.There is no weight on file to calculate BMI.  General Appearance: Casual  Eye Contact::  Good  Speech:  Normal Rate  Volume:  Normal  Mood:  Euthymic  Affect:  Congruent  Thought Process:  Coherent  Orientation:  Full (Time, Place, and Person)  Thought Content:  WDL  Suicidal Thoughts:  No  Homicidal Thoughts:  No  Memory:  Immediate;   Good Recent;   Good Remote;   Good  Judgement:  Fair  Insight:  Good  Psychomotor Activity:  Normal  Concentration:  Good  Recall:  Good  Fund of Knowledge:Fair  Language: Good  Akathisia:  No  Handed:  Right  AIMS (if indicated):     Assets:  Catering manager Housing Leisure Time Physical Health Resilience Social Support  ADL's:  Intact  Cognition: Impaired,  Mild  Sleep:          Has this patient used any form of tobacco in the last 30 days? (Cigarettes, Smokeless Tobacco, Cigars, and/or Pipes) Yes, A prescription for an FDA-approved tobacco cessation medication was offered at discharge and the patient refused  Mental Status Per Nursing Assessment::   On Admission:   Hallucinations   Current Mental Status by Physician: NA  Loss Factors: NA  Historical Factors: NA  Risk Reduction Factors:   Sense of responsibility to family, Living with another person, especially a relative, Positive social support and Positive therapeutic relationship  Continued Clinical Symptoms:  None  Cognitive Features That Contribute To Risk:  None    Suicide Risk:  Minimal:  No identifiable suicidal ideation.  Patients presenting with no risk factors but with morbid ruminations; may be classified as minimal risk based on the severity of the depressive symptoms  Principal Problem: Schizoaffective disorder, bipolar type Discharge Diagnoses:  Patient Active Problem List   Diagnosis Date Noted  . Schizoaffective disorder, bipolar type [F25.0] 05/30/2014    Priority: High  . Suicidal ideation [R45.851]   . 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 Headache [R51] 04/15/2013  . HTN (hypertension) [I10] 04/15/2013  . Bipolar disorder, current episode depressed, severe, with psychotic features [F31.5] 12/07/2011  . Post traumatic stress disorder [F43.10] 12/07/2011  . CAP (community acquired pneumonia) [J18.9] 08/27/2011  . Chest pain [R07.9] 08/26/2011  . SOB (shortness of breath) [R06.02] 08/26/2011  . Fever [R50.9] 08/26/2011  . Hypokalemia [E87.6] 08/26/2011  . PSVT (paroxysmal supraventricular tachycardia) [I47.1] 08/26/2011  . ADHD [F90.9] 09/23/2007  . EPIGASTRIC PAIN [R10.13] 09/23/2007  . Obesity, unspecified [E66.9] 07/30/2007  . DEPRESSION [F32.9] 07/30/2007  . SLEEP DISORDER [G47.9] 07/30/2007  . IMPAIRED FASTING GLUCOSE [R73.01] 07/30/2007  . FATIGUE [R53.81, R53.83] 11/21/2006  . ABNORMAL FINDINGS, ELEVATED BP W/O HTN [R03.0] 11/21/2006  . METRORRHAGIA [N92.1] 06/13/2006  . DISORDER, MENSTRUAL NEC [N94.9] 06/13/2006  . DIZZINESS [R42] 06/13/2006  . POLYCYSTIC OVARIAN DISEASE [E28.2] 04/25/2006  . AMENORRHEA, SECONDARY [N91.2] 04/20/2006  . ACNE, MILD [L70.8] 04/20/2006  . ABDOMINAL PAIN [R10.9] 04/20/2006      Plan Of Care/Follow-up recommendations:  Activity:  as tolerated Diet:  heart healthy  diet  Is patient on multiple antipsychotic therapies at discharge:  No   Has Patient had three or more failed trials of antipsychotic monotherapy by history:  No  Recommended Plan for Multiple Antipsychotic Therapies: NA    Shylyn Younce,  [place], [place] 05/30/2014, 1:28 PM

## 2014-05-30 NOTE — Progress Notes (Signed)
Pt referral faxed to the following facilities who report they are accepting referrals or have bed availability:  [facility]  Will continue seeking placement.  Peri Maris, LCSWA 05/30/2014 11:02 AM

## 2014-05-30 NOTE — ED Notes (Signed)
Patient awake eating breakfast, concerned about her medication dosages.  She reports taking 150mg  of Zoloft normally and 800mg  of Seroquel.  Requested that patient talk this over with psychiatrist when he rounds today.

## 2014-06-01 DIAGNOSIS — F329 Major depressive disorder, single episode, unspecified: Secondary | ICD-10-CM | POA: Diagnosis not present

## 2014-06-01 DIAGNOSIS — R1011 Right upper quadrant pain: Secondary | ICD-10-CM | POA: Diagnosis not present

## 2014-06-01 DIAGNOSIS — R110 Nausea: Secondary | ICD-10-CM | POA: Diagnosis not present

## 2014-06-01 DIAGNOSIS — R1031 Right lower quadrant pain: Secondary | ICD-10-CM | POA: Diagnosis not present

## 2014-06-01 DIAGNOSIS — F209 Schizophrenia, unspecified: Secondary | ICD-10-CM | POA: Diagnosis not present

## 2014-06-01 DIAGNOSIS — R109 Unspecified abdominal pain: Secondary | ICD-10-CM | POA: Diagnosis not present

## 2014-06-01 DIAGNOSIS — J439 Emphysema, unspecified: Secondary | ICD-10-CM | POA: Diagnosis not present

## 2014-06-01 DIAGNOSIS — E119 Type 2 diabetes mellitus without complications: Secondary | ICD-10-CM | POA: Diagnosis not present

## 2014-06-01 DIAGNOSIS — R05 Cough: Secondary | ICD-10-CM | POA: Diagnosis not present

## 2014-06-01 DIAGNOSIS — Z5321 Procedure and treatment not carried out due to patient leaving prior to being seen by health care provider: Secondary | ICD-10-CM | POA: Diagnosis not present

## 2014-06-01 DIAGNOSIS — I10 Essential (primary) hypertension: Secondary | ICD-10-CM | POA: Diagnosis not present

## 2014-06-01 DIAGNOSIS — F419 Anxiety disorder, unspecified: Secondary | ICD-10-CM | POA: Diagnosis not present

## 2014-06-01 DIAGNOSIS — E780 Pure hypercholesterolemia: Secondary | ICD-10-CM | POA: Diagnosis not present

## 2014-06-03 DIAGNOSIS — R162 Hepatomegaly with splenomegaly, not elsewhere classified: Secondary | ICD-10-CM | POA: Diagnosis not present

## 2014-06-03 DIAGNOSIS — R1031 Right lower quadrant pain: Secondary | ICD-10-CM | POA: Diagnosis not present

## 2014-06-03 DIAGNOSIS — Z888 Allergy status to other drugs, medicaments and biological substances status: Secondary | ICD-10-CM | POA: Diagnosis not present

## 2014-06-03 DIAGNOSIS — E119 Type 2 diabetes mellitus without complications: Secondary | ICD-10-CM | POA: Diagnosis not present

## 2014-06-03 DIAGNOSIS — Z886 Allergy status to analgesic agent status: Secondary | ICD-10-CM | POA: Diagnosis not present

## 2014-06-03 DIAGNOSIS — I10 Essential (primary) hypertension: Secondary | ICD-10-CM | POA: Diagnosis not present

## 2014-06-03 DIAGNOSIS — G629 Polyneuropathy, unspecified: Secondary | ICD-10-CM | POA: Diagnosis not present

## 2014-06-03 DIAGNOSIS — R05 Cough: Secondary | ICD-10-CM | POA: Diagnosis not present

## 2014-06-03 DIAGNOSIS — K219 Gastro-esophageal reflux disease without esophagitis: Secondary | ICD-10-CM | POA: Diagnosis not present

## 2014-06-03 DIAGNOSIS — Z6841 Body Mass Index (BMI) 40.0 and over, adult: Secondary | ICD-10-CM | POA: Diagnosis not present

## 2014-06-03 DIAGNOSIS — E669 Obesity, unspecified: Secondary | ICD-10-CM | POA: Diagnosis not present

## 2014-06-03 DIAGNOSIS — J189 Pneumonia, unspecified organism: Secondary | ICD-10-CM | POA: Diagnosis not present

## 2014-06-03 DIAGNOSIS — R109 Unspecified abdominal pain: Secondary | ICD-10-CM | POA: Diagnosis not present

## 2014-06-03 DIAGNOSIS — Z79899 Other long term (current) drug therapy: Secondary | ICD-10-CM | POA: Diagnosis not present

## 2014-06-05 DIAGNOSIS — F4310 Post-traumatic stress disorder, unspecified: Secondary | ICD-10-CM | POA: Diagnosis not present

## 2014-07-09 ENCOUNTER — Encounter (HOSPITAL_COMMUNITY): Payer: Self-pay | Admitting: Emergency Medicine

## 2014-07-09 ENCOUNTER — Emergency Department (HOSPITAL_COMMUNITY)
Admission: EM | Admit: 2014-07-09 | Discharge: 2014-07-09 | Disposition: A | Discharge status: Home / Self Care | Payer: Medicare Other | Attending: Emergency Medicine | Admitting: Emergency Medicine

## 2014-07-09 ENCOUNTER — Emergency Department (HOSPITAL_COMMUNITY): Payer: Medicare Other

## 2014-07-09 DIAGNOSIS — K439 Ventral hernia without obstruction or gangrene: Secondary | ICD-10-CM | POA: Diagnosis not present

## 2014-07-09 DIAGNOSIS — R079 Chest pain, unspecified: Secondary | ICD-10-CM | POA: Diagnosis not present

## 2014-07-09 DIAGNOSIS — Z79899 Other long term (current) drug therapy: Secondary | ICD-10-CM | POA: Insufficient documentation

## 2014-07-09 DIAGNOSIS — R05 Cough: Secondary | ICD-10-CM | POA: Diagnosis not present

## 2014-07-09 DIAGNOSIS — E119 Type 2 diabetes mellitus without complications: Secondary | ICD-10-CM | POA: Insufficient documentation

## 2014-07-09 DIAGNOSIS — F209 Schizophrenia, unspecified: Secondary | ICD-10-CM | POA: Insufficient documentation

## 2014-07-09 DIAGNOSIS — R112 Nausea with vomiting, unspecified: Secondary | ICD-10-CM | POA: Diagnosis not present

## 2014-07-09 DIAGNOSIS — E669 Obesity, unspecified: Secondary | ICD-10-CM | POA: Diagnosis not present

## 2014-07-09 DIAGNOSIS — Z85831 Personal history of malignant neoplasm of soft tissue: Secondary | ICD-10-CM | POA: Insufficient documentation

## 2014-07-09 DIAGNOSIS — R1012 Left upper quadrant pain: Secondary | ICD-10-CM | POA: Diagnosis present

## 2014-07-09 DIAGNOSIS — F419 Anxiety disorder, unspecified: Secondary | ICD-10-CM | POA: Diagnosis not present

## 2014-07-09 DIAGNOSIS — R059 Cough, unspecified: Secondary | ICD-10-CM

## 2014-07-09 DIAGNOSIS — K760 Fatty (change of) liver, not elsewhere classified: Secondary | ICD-10-CM | POA: Diagnosis not present

## 2014-07-09 DIAGNOSIS — Z8639 Personal history of other endocrine, nutritional and metabolic disease: Secondary | ICD-10-CM | POA: Diagnosis not present

## 2014-07-09 DIAGNOSIS — Z3202 Encounter for pregnancy test, result negative: Secondary | ICD-10-CM | POA: Insufficient documentation

## 2014-07-09 DIAGNOSIS — D735 Infarction of spleen: Secondary | ICD-10-CM | POA: Insufficient documentation

## 2014-07-09 DIAGNOSIS — R0789 Other chest pain: Secondary | ICD-10-CM

## 2014-07-09 DIAGNOSIS — F319 Bipolar disorder, unspecified: Secondary | ICD-10-CM | POA: Insufficient documentation

## 2014-07-09 DIAGNOSIS — I10 Essential (primary) hypertension: Secondary | ICD-10-CM | POA: Insufficient documentation

## 2014-07-09 DIAGNOSIS — R160 Hepatomegaly, not elsewhere classified: Secondary | ICD-10-CM | POA: Diagnosis not present

## 2014-07-09 DIAGNOSIS — Z8589 Personal history of malignant neoplasm of other organs and systems: Secondary | ICD-10-CM | POA: Insufficient documentation

## 2014-07-09 LAB — COMPREHENSIVE METABOLIC PANEL
ALK PHOS: 99 U/L (ref 38–126)
ALT: 40 U/L (ref 14–54)
AST: 66 U/L — AB (ref 15–41)
Albumin: 3.6 g/dL (ref 3.5–5.0)
Anion gap: 12 (ref 5–15)
BUN: 6 mg/dL (ref 6–20)
CALCIUM: 8.9 mg/dL (ref 8.9–10.3)
CO2: 22 mmol/L (ref 22–32)
Chloride: 104 mmol/L (ref 101–111)
Creatinine, Ser: 0.67 mg/dL (ref 0.44–1.00)
GFR calc non Af Amer: >60 mL/min (ref >60)
GLUCOSE: 181 mg/dL — AB (ref 65–99)
POTASSIUM: 3.5 mmol/L (ref 3.5–5.1)
SODIUM: 138 mmol/L (ref 135–145)
TOTAL PROTEIN: 7.3 g/dL (ref 6.5–8.1)
Total Bilirubin: 0.5 mg/dL (ref 0.3–1.2)

## 2014-07-09 LAB — CBC WITH DIFFERENTIAL/PLATELET
BASOS PCT: 0 % (ref 0–1)
Basophils Absolute: 0 10*3/uL (ref 0.0–0.1)
EOS ABS: 0.2 10*3/uL (ref 0.0–0.7)
Eosinophils Relative: 3 % (ref 0–5)
HCT: 37.8 % (ref 36.0–46.0)
Hemoglobin: 12 g/dL (ref 12.0–15.0)
Lymphocytes Relative: 21 % (ref 12–46)
Lymphs Abs: 1.2 10*3/uL (ref 0.7–4.0)
MCH: 26.1 pg (ref 26.0–34.0)
MCHC: 31.7 g/dL (ref 30.0–36.0)
MCV: 82.4 fL (ref 78.0–100.0)
Monocytes Absolute: 0.3 10*3/uL (ref 0.1–1.0)
Monocytes Relative: 5 % (ref 3–12)
NEUTROS PCT: 71 % (ref 43–77)
Neutro Abs: 4.2 10*3/uL (ref 1.7–7.7)
PLATELETS: 184 10*3/uL (ref 150–400)
RBC: 4.59 MIL/uL (ref 3.87–5.11)
RDW: 15.4 % (ref 11.5–15.5)
WBC: 5.9 10*3/uL (ref 4.0–10.5)

## 2014-07-09 LAB — URINE MICROSCOPIC-ADD ON

## 2014-07-09 LAB — URINALYSIS, ROUTINE W REFLEX MICROSCOPIC
Glucose, UA: NEGATIVE mg/dL
HGB URINE DIPSTICK: NEGATIVE
Ketones, ur: 15 mg/dL — AB
Nitrite: NEGATIVE
PH: 5 (ref 5.0–8.0)
Protein, ur: NEGATIVE mg/dL
SPECIFIC GRAVITY, URINE: 1.031 — AB (ref 1.005–1.030)
Urobilinogen, UA: 1 mg/dL (ref 0.0–1.0)

## 2014-07-09 LAB — POC URINE PREG, ED: Preg Test, Ur: NEGATIVE

## 2014-07-09 LAB — LIPASE, BLOOD: Lipase: 28 U/L (ref 22–51)

## 2014-07-09 MED ORDER — IOHEXOL 300 MG/ML  SOLN
100.0000 mL | Freq: Once | INTRAMUSCULAR | Status: AC | PRN
  Administered 2014-07-09: 100 mL via INTRAVENOUS

## 2014-07-09 MED ORDER — LIDOCAINE HCL (CARDIAC) 20 MG/ML IV SOLN
INTRAVENOUS | Status: AC
  Filled 2014-07-09: qty 5

## 2014-07-09 MED ORDER — ONDANSETRON HCL 4 MG/2ML IJ SOLN
4.0000 mg | Freq: Once | INTRAMUSCULAR | Status: AC
  Administered 2014-07-09: 4 mg via INTRAVENOUS
  Filled 2014-07-09: qty 2

## 2014-07-09 MED ORDER — ETOMIDATE 2 MG/ML IV SOLN
INTRAVENOUS | Status: AC
  Filled 2014-07-09: qty 20

## 2014-07-09 MED ORDER — IOHEXOL 300 MG/ML  SOLN: 25.0000 mL | Freq: Once | INTRAMUSCULAR | Status: DC | PRN

## 2014-07-09 MED ORDER — SUCCINYLCHOLINE CHLORIDE 20 MG/ML IJ SOLN
INTRAMUSCULAR | Status: AC
  Filled 2014-07-09: qty 1

## 2014-07-09 MED ORDER — ROCURONIUM BROMIDE 50 MG/5ML IV SOLN
INTRAVENOUS | Status: AC
  Filled 2014-07-09: qty 2

## 2014-07-09 MED ORDER — MORPHINE SULFATE 4 MG/ML IJ SOLN
4.0000 mg | Freq: Once | INTRAMUSCULAR | Status: AC
  Administered 2014-07-09: 4 mg via INTRAVENOUS
  Filled 2014-07-09: qty 1

## 2014-07-09 MED ORDER — SODIUM CHLORIDE 0.9 % IV BOLUS (SEPSIS)
1000.0000 mL | Freq: Once | INTRAVENOUS | Status: AC
  Administered 2014-07-09: 1000 mL via INTRAVENOUS

## 2014-07-09 NOTE — ED Notes (Signed)
MD at bedside. 

## 2014-07-09 NOTE — ED Notes (Signed)
Pt instructed to hold metformin for 2 days

## 2014-07-09 NOTE — ED Notes (Signed)
Pt instructed to have friend/family pick up, not to drive since given morphine 2 hrs ago

## 2014-07-09 NOTE — ED Notes (Signed)
Pt. reports left abdominal pain with emesis onset 2 days ago , no diarrhea , denies fever or chills.

## 2014-07-09 NOTE — ED Provider Notes (Signed)
CSN: [id number]     Arrival date & time 07/09/14  1842 History   First MD Initiated Contact with Patient 07/09/14 1954     Chief Complaint  Patient presents with  . Abdominal Pain     (Consider location/radiation/quality/duration/timing/severity/associated sxs/prior Treatment) Patient is a 29 y.o. female presenting with abdominal pain.  Abdominal Pain Pain location:  LUQ Pain quality comment:  "about to explode" Pain radiates to:  Does not radiate Pain severity:  Severe Onset quality:  Gradual Duration:  2 days Timing:  Constant Progression:  Worsening Chronicity:  New Context: previous surgery and recent illness (PNA)   Context: not alcohol use, not eating, not laxative use, not recent sexual activity, not sick contacts, not suspicious food intake and not trauma   Relieved by:  Nothing Worsened by:  Movement and coughing Ineffective treatments:  Acetaminophen and NSAIDs Associated symptoms: nausea and vomiting   Associated symptoms: no anorexia, no chest pain, no chills, no constipation, no cough, no diarrhea, no dysuria, no fatigue, no fever, no hematemesis, no melena, no shortness of breath and no sore throat   Risk factors: NSAID use and obesity   Risk factors: no alcohol abuse     Past Medical History  Diagnosis Date  . Polycystic ovarian syndrome 07/01/2011    Patient report  . Anxiety   . Depression   . Hypertension   . Obesity   . Schizophrenia   . Bipolar 1 disorder   . Obesity   . Cancer of abdominal wall   . Rhabdosarcoma   . Diabetes mellitus without complication    Past Surgical History  Procedure Laterality Date  . Cholecystectomy    . Varicose vein surgery    . Ovarian cyst excision    . Hernia repair     Family History  Problem Relation Age of Onset  . Coronary artery disease Maternal Grandmother   . Diabetes type II Maternal Grandmother   . Cancer Maternal Grandmother   . Hypertension Mother   . Hypertension Father    History  Substance  Use Topics  . Smoking status: Never Smoker   . Smokeless tobacco: Never Used  . Alcohol Use: No   OB History    Gravida Para Term Preterm AB TAB SAB Ectopic Multiple Living   0              Review of Systems  Constitutional: Negative for fever, chills, appetite change and fatigue.  HENT: Negative for congestion, ear pain, facial swelling, mouth sores and sore throat.   Eyes: Negative for visual disturbance.  Respiratory: Negative for cough, chest tightness and shortness of breath.   Cardiovascular: Negative for chest pain and palpitations.  Gastrointestinal: Positive for nausea, vomiting and abdominal pain. Negative for diarrhea, constipation, blood in stool, melena, anorexia and hematemesis.  Endocrine: Negative for cold intolerance and heat intolerance.  Genitourinary: Negative for dysuria, frequency, decreased urine volume and difficulty urinating.  Musculoskeletal: Negative for back pain and neck stiffness.  Skin: Negative for rash.  Neurological: Negative for dizziness, weakness, light-headedness and headaches.  All other systems reviewed and are negative.     Allergies  Fish-derived products; Geodon; Haldol; Compazine; Toradol; and Buprenorphine hcl  Home Medications   Prior to Admission medications   Medication Sig Start Date End Date Taking? Authorizing Provider  busPIRone (BUSPAR) 15 MG tablet Take 15 mg by mouth 3 (three) times daily.   Yes Historical Provider, MD  gabapentin (NEURONTIN) 400 MG capsule Take 2 capsules (800 mg  total) by mouth at bedtime. Patient taking differently: Take 400-800 mg by mouth 4 (four) times daily. Take 400 mg three times daily; Take 800 mg at bedtime 05/24/14  Yes Kaitlyn Szekalski, PA-C  Melatonin 3 MG TABS Take 3 mg by mouth at bedtime as needed (for sleep).    Yes Historical Provider, MD  pantoprazole (PROTONIX) 40 MG tablet Take 40 mg by mouth at bedtime.   Yes Historical Provider, MD  QUEtiapine (SEROQUEL XR) 400 MG 24 hr tablet Take  800 mg by mouth at bedtime.   Yes Historical Provider, MD  sertraline (ZOLOFT) 50 MG tablet Take 150 mg by mouth at bedtime. 05/01/14  Yes Historical Provider, MD  clonazePAM (KLONOPIN) 0.5 MG tablet Take 1 tablet (0.5 mg total) by mouth 2 (two) times daily. Patient not taking: Reported on 07/09/2014 03/03/14   Wandra Arthurs, MD   BP 151/84 mmHg  Pulse 106  Temp(Src) 97.7 F (36.5 C) (Oral)  Resp 20  Ht 5\' 4"  (1.626 m)  Wt 260 lb (117.935 kg)  BMI 44.61 kg/m2  SpO2 96%  LMP 07/02/2014 Physical Exam  Constitutional: She is oriented to person, place, and time. She appears well-developed and well-nourished. No distress.  HENT:  Head: Normocephalic and atraumatic.  Right Ear: External ear normal.  Left Ear: External ear normal.  Nose: Nose normal.  Eyes: Conjunctivae and EOM are normal. Pupils are equal, round, and reactive to light. Right eye exhibits no discharge. Left eye exhibits no discharge. No scleral icterus.  Neck: Normal range of motion. Neck supple.  Cardiovascular: Normal rate, regular rhythm and normal heart sounds.  Exam reveals no gallop and no friction rub.   No murmur heard. Pulmonary/Chest: Effort normal and breath sounds normal. No stridor. No respiratory distress. She has no wheezes. She exhibits tenderness.    Abdominal: Soft. She exhibits no distension. There is tenderness in the left upper quadrant. There is no rigidity, no rebound and no guarding.    Musculoskeletal: She exhibits no edema or tenderness.  Neurological: She is alert and oriented to person, place, and time.  Skin: Skin is warm and dry. No rash noted. She is not diaphoretic. No erythema.  Psychiatric: She has a normal mood and affect.    ED Course  Procedures (including critical care time) Labs Review Labs Reviewed  COMPREHENSIVE METABOLIC PANEL - Abnormal; Notable for the following:    Glucose, Bld 181 (*)    AST 66 (*)    All other components within normal limits  URINALYSIS, ROUTINE W REFLEX  MICROSCOPIC (NOT AT [facility]) - Abnormal; Notable for the following:    Color, Urine AMBER (*)    APPearance CLOUDY (*)    Specific Gravity, Urine 1.031 (*)    Bilirubin Urine SMALL (*)    Ketones, ur 15 (*)    Leukocytes, UA SMALL (*)    All other components within normal limits  URINE MICROSCOPIC-ADD ON - Abnormal; Notable for the following:    Squamous Epithelial / LPF MANY (*)    Bacteria, UA MANY (*)    All other components within normal limits  CBC WITH DIFFERENTIAL/PLATELET  LIPASE, BLOOD  POC URINE PREG, ED    Imaging Review Ct Abdomen Pelvis W Contrast  07/09/2014   CLINICAL DATA:  Left-sided abdominal pain, nausea and vomiting for 2 days.  EXAM: CT ABDOMEN AND PELVIS WITH CONTRAST  TECHNIQUE: Multidetector CT imaging of the abdomen and pelvis was performed using the standard protocol following bolus administration of intravenous contrast.  CONTRAST:  152mL OMNIPAQUE IOHEXOL 300 MG/ML  SOLN  COMPARISON:  05/29/2014  FINDINGS: Mild dependent changes in the lung bases.  Diffuse fatty infiltration of the liver. No focal liver lesions. Surgical absence of the gallbladder. No bile duct dilatation. Spleen is mildly enlarged. Small focal low-attenuation in the lateral spleen is smaller than on prior study suggesting expected evolution of focal splenic infarct. Pancreas, adrenal glands, kidneys, abdominal aorta, inferior vena cava, and retroperitoneal lymph nodes are unremarkable. Stomach, small bowel, and colon are not abnormally distended. No free air or free fluid in the abdomen. Mild prominence of mesenteric and right lower quadrant lymph nodes, likely due to reactive or inflammatory process. No change since prior study. Small anterior low abdominal wall midline hernia containing fat. The hernia is larger than on previous study. There is infiltration in the fat which may indicate fat necrosis. No bowel herniation.  Pelvis: Uterus and ovaries are not enlarged. No free or loculated pelvic fluid  collections. Appendix is normal. Rectosigmoid colon is decompressed without inflammatory change. No destructive bone lesions.  IMPRESSION: Diffuse fatty infiltration of the liver. Splenic enlargement with focal lesion in the spleen smaller than previous study, likely infarct. Small but increasing size of midline low abdominal wall hernia containing fat with possible fat necrosis.   Electronically Signed   By: Lucienne Capers M.D.   On: 07/09/2014 22:41   Dg Chest Port 1 View  07/09/2014   CLINICAL DATA:  Left side chest pain for 2 days  EXAM: PORTABLE CHEST - 1 VIEW  COMPARISON:  06/01/2014.  FINDINGS: Cardiomediastinal silhouette is unremarkable. No acute infiltrate or pleural effusion. No pulmonary edema. Bony thorax is unremarkable.  IMPRESSION: No active disease.   Electronically Signed   By: Lahoma Crocker M.D.   On: 07/09/2014 20:41     EKG Interpretation   Date/Time:  Thursday July 09 2014 20:27:07 EDT Ventricular Rate:  105 PR Interval:  171 QRS Duration: 84 QT Interval:  370 QTC Calculation: 489 R Axis:   -15 Text Interpretation:  Sinus tachycardia Borderline left axis deviation  Borderline prolonged QT interval No significant change since last tracing  Confirmed by Debby Freiberg [phone]) on 07/09/2014 10:24:31 PM      MDM   29 year old female with past medical history listed above presents with 2 days of left upper quadrant and left chest pain. History of present illness as above. Additionally patient was treated for pneumonia with doxycycline and several months ago and has had persistent cough since. Also patient had a CT of the abdomen performed last month revealing splenic infarct. She has not followed up for this since that time. Exam as above. Presentation most consistent with muscle strain secondary to severe coughing. Chest x-ray without evidence of pneumonia, pleural effusion, pneumothorax. CT abdomen was obtained to assess progression of the splenic infarct. It revealed that  the heart is improving. Did show small anterior wall fat hernia that is not tender on exam. No other acute etiology noted. UPT negative. CBC without leukocytosis or anemia. CMP without electrolyte derangements or renal insufficiency.  Lipase negative. UA contaminated without evidence of infection. She given symptomatic treatment with some improvement.  Patient in good condition and stable for discharge with strict return precautions. 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  Patient seen in conjunction with Dr. Colin Rhein.   Final diagnoses:  Cough  LUQ pain  Left-sided chest wall pain  Splenic infarct        Addison Lank, MD 07/09/14 3013  Debby Freiberg, MD 07/09/14 2352

## 2014-07-09 NOTE — Discharge Instructions (Signed)
Muscle Strain A muscle strain is an injury that occurs when a muscle is stretched beyond its normal length. Usually a small number of muscle fibers are torn when this happens. Muscle strain is rated in degrees. First-degree strains have the least amount of muscle fiber tearing and pain. Second-degree and third-degree strains have increasingly more tearing and pain.  Usually, recovery from muscle strain takes 1-2 weeks. Complete healing takes 5-6 weeks.  CAUSES  Muscle strain happens when a sudden, violent force placed on a muscle stretches it too far. This may occur with lifting, sports, or a fall.  RISK FACTORS Muscle strain is especially common in athletes.  SIGNS AND SYMPTOMS At the site of the muscle strain, there may be:  Pain.  Bruising.  Swelling.  Difficulty using the muscle due to pain or lack of normal function. DIAGNOSIS  Your health care provider will perform a physical exam and ask about your medical history. TREATMENT  Often, the best treatment for a muscle strain is resting, icing, and applying cold compresses to the injured area.  HOME CARE INSTRUCTIONS   Use the PRICE method of treatment to promote muscle healing during the first 2-3 days after your injury. The PRICE method involves:  Protecting the muscle from being injured again.  Restricting your activity and resting the injured body part.  Icing your injury. To do this, put ice in a plastic bag. Place a towel between your skin and the bag. Then, apply the ice and leave it on from 15-20 minutes each hour. After the third day, switch to moist heat packs.  Apply compression to the injured area with a splint or elastic bandage. Be careful not to wrap it too tightly. This may interfere with blood circulation or increase swelling.  Elevate the injured body part above the level of your heart as often as you can.  Only take over-the-counter or prescription medicines for pain, discomfort, or fever as directed by your  health care provider.  Warming up prior to exercise helps to prevent future muscle strains. SEEK MEDICAL CARE IF:   You have increasing pain or swelling in the injured area.  You have numbness, tingling, or a significant loss of strength in the injured area. MAKE SURE YOU:   Understand these instructions.  Will watch your condition.  Will get help right away if you are not doing well or get worse. Document Released: 01/09/2005 Document Revised: 10/30/2012 Document Reviewed: 08/08/2012 [facility] Patient Information 2015 [place], [place]. This information is not intended to replace advice given to you by your health care provider. Make sure you discuss any questions you have with your health care provider. Cough, Adult  A cough is a reflex that helps clear your throat and airways. It can help heal the body or may be a reaction to an irritated airway. A cough may only last 2 or 3 weeks (acute) or may last more than 8 weeks (chronic).  CAUSES Acute cough:  Viral or bacterial infections. Chronic cough:  Infections.  Allergies.  Asthma.  Post-nasal drip.  Smoking.  Heartburn or acid reflux.  Some medicines.  Chronic lung problems (COPD).  Cancer. SYMPTOMS   Cough.  Fever.  Chest pain.  Increased breathing rate.  High-pitched whistling sound when breathing (wheezing).  Colored mucus that you cough up (sputum). TREATMENT   A bacterial cough may be treated with antibiotic medicine.  A viral cough must run its course and will not respond to antibiotics.  Your caregiver may recommend other treatments  if you have a chronic cough. HOME CARE INSTRUCTIONS   Only take over-the-counter or prescription medicines for pain, discomfort, or fever as directed by your caregiver. Use cough suppressants only as directed by your caregiver.  Use a cold steam vaporizer or humidifier in your bedroom or home to help loosen secretions.  Sleep in a semi-upright position if your cough  is worse at night.  Rest as needed.  Stop smoking if you smoke. SEEK IMMEDIATE MEDICAL CARE IF:   You have pus in your sputum.  Your cough starts to worsen.  You cannot control your cough with suppressants and are losing sleep.  You begin coughing up blood.  You have difficulty breathing.  You develop pain which is getting worse or is uncontrolled with medicine.  You have a fever. MAKE SURE YOU:   Understand these instructions.  Will watch your condition.  Will get help right away if you are not doing well or get worse. Document Released: 07/08/2010 Document Revised: 04/03/2011 Document Reviewed: 07/08/2010 [facility] Patient Information 2015 [place], [place]. This information is not intended to replace advice given to you by your health care provider. Make sure you discuss any questions you have with your health care provider.

## 2014-07-09 NOTE — ED Notes (Signed)
Xray bedside.

## 2014-07-10 ENCOUNTER — Encounter: Payer: Self-pay | Admitting: *Deleted

## 2014-07-23 DIAGNOSIS — M79605 Pain in left leg: Secondary | ICD-10-CM | POA: Diagnosis not present

## 2014-07-23 DIAGNOSIS — F319 Bipolar disorder, unspecified: Secondary | ICD-10-CM | POA: Diagnosis not present

## 2014-07-23 DIAGNOSIS — E119 Type 2 diabetes mellitus without complications: Secondary | ICD-10-CM | POA: Diagnosis not present

## 2014-07-23 DIAGNOSIS — F209 Schizophrenia, unspecified: Secondary | ICD-10-CM | POA: Diagnosis not present

## 2014-07-23 DIAGNOSIS — Z79899 Other long term (current) drug therapy: Secondary | ICD-10-CM | POA: Diagnosis not present

## 2014-07-23 DIAGNOSIS — R200 Anesthesia of skin: Secondary | ICD-10-CM | POA: Diagnosis not present

## 2014-07-23 DIAGNOSIS — S8012XA Contusion of left lower leg, initial encounter: Secondary | ICD-10-CM | POA: Diagnosis not present

## 2014-07-23 DIAGNOSIS — G629 Polyneuropathy, unspecified: Secondary | ICD-10-CM | POA: Diagnosis not present

## 2014-07-23 DIAGNOSIS — S8011XA Contusion of right lower leg, initial encounter: Secondary | ICD-10-CM | POA: Diagnosis not present

## 2014-07-23 DIAGNOSIS — M79604 Pain in right leg: Secondary | ICD-10-CM | POA: Diagnosis not present

## 2014-07-23 DIAGNOSIS — T148 Other injury of unspecified body region: Secondary | ICD-10-CM | POA: Diagnosis not present

## 2014-08-12 DIAGNOSIS — R1031 Right lower quadrant pain: Secondary | ICD-10-CM | POA: Diagnosis not present

## 2014-08-12 DIAGNOSIS — E119 Type 2 diabetes mellitus without complications: Secondary | ICD-10-CM | POA: Diagnosis not present

## 2014-08-12 DIAGNOSIS — Z79899 Other long term (current) drug therapy: Secondary | ICD-10-CM | POA: Diagnosis not present

## 2014-08-12 DIAGNOSIS — Z3202 Encounter for pregnancy test, result negative: Secondary | ICD-10-CM | POA: Diagnosis not present

## 2014-08-12 DIAGNOSIS — K760 Fatty (change of) liver, not elsewhere classified: Secondary | ICD-10-CM | POA: Diagnosis not present

## 2014-08-12 DIAGNOSIS — I10 Essential (primary) hypertension: Secondary | ICD-10-CM | POA: Diagnosis not present

## 2014-08-13 DIAGNOSIS — R1031 Right lower quadrant pain: Secondary | ICD-10-CM | POA: Diagnosis not present

## 2014-08-13 DIAGNOSIS — K760 Fatty (change of) liver, not elsewhere classified: Secondary | ICD-10-CM | POA: Diagnosis not present

## 2014-09-09 DIAGNOSIS — J4520 Mild intermittent asthma, uncomplicated: Secondary | ICD-10-CM | POA: Diagnosis not present

## 2014-09-09 DIAGNOSIS — E1165 Type 2 diabetes mellitus with hyperglycemia: Secondary | ICD-10-CM | POA: Diagnosis not present

## 2014-09-09 DIAGNOSIS — K219 Gastro-esophageal reflux disease without esophagitis: Secondary | ICD-10-CM | POA: Diagnosis not present

## 2014-09-09 DIAGNOSIS — F3181 Bipolar II disorder: Secondary | ICD-10-CM | POA: Diagnosis not present

## 2014-09-09 DIAGNOSIS — E559 Vitamin D deficiency, unspecified: Secondary | ICD-10-CM | POA: Diagnosis not present

## 2014-09-09 DIAGNOSIS — I10 Essential (primary) hypertension: Secondary | ICD-10-CM | POA: Diagnosis not present

## 2014-09-09 DIAGNOSIS — E784 Other hyperlipidemia: Secondary | ICD-10-CM | POA: Diagnosis not present

## 2014-09-09 DIAGNOSIS — E119 Type 2 diabetes mellitus without complications: Secondary | ICD-10-CM | POA: Diagnosis not present

## 2014-11-20 DIAGNOSIS — H5211 Myopia, right eye: Secondary | ICD-10-CM | POA: Diagnosis not present

## 2014-11-20 DIAGNOSIS — H53451 Other localized visual field defect, right eye: Secondary | ICD-10-CM | POA: Diagnosis not present

## 2014-11-20 DIAGNOSIS — H547 Unspecified visual loss: Secondary | ICD-10-CM | POA: Diagnosis not present

## 2014-11-20 DIAGNOSIS — H47091 Other disorders of optic nerve, not elsewhere classified, right eye: Secondary | ICD-10-CM | POA: Diagnosis not present

## 2014-11-20 DIAGNOSIS — H35013 Changes in retinal vascular appearance, bilateral: Secondary | ICD-10-CM | POA: Diagnosis not present

## 2014-11-20 DIAGNOSIS — H04123 Dry eye syndrome of bilateral lacrimal glands: Secondary | ICD-10-CM | POA: Diagnosis not present

## 2014-11-20 DIAGNOSIS — H5202 Hypermetropia, left eye: Secondary | ICD-10-CM | POA: Diagnosis not present

## 2014-11-20 DIAGNOSIS — H53482 Generalized contraction of visual field, left eye: Secondary | ICD-10-CM | POA: Diagnosis not present

## 2014-11-25 DIAGNOSIS — H53451 Other localized visual field defect, right eye: Secondary | ICD-10-CM | POA: Diagnosis not present

## 2014-11-25 DIAGNOSIS — H53482 Generalized contraction of visual field, left eye: Secondary | ICD-10-CM | POA: Diagnosis not present

## 2014-12-22 ENCOUNTER — Emergency Department
Admission: EM | Admit: 2014-12-22 | Discharge: 2014-12-22 | Disposition: A | Discharge status: Home / Self Care | Payer: No Typology Code available for payment source | Attending: Emergency Medicine | Admitting: Emergency Medicine

## 2014-12-22 ENCOUNTER — Emergency Department: Payer: No Typology Code available for payment source

## 2014-12-22 ENCOUNTER — Emergency Department: Payer: Self-pay

## 2014-12-22 DIAGNOSIS — E119 Type 2 diabetes mellitus without complications: Secondary | ICD-10-CM | POA: Insufficient documentation

## 2014-12-22 DIAGNOSIS — E785 Hyperlipidemia, unspecified: Secondary | ICD-10-CM | POA: Insufficient documentation

## 2014-12-22 DIAGNOSIS — I10 Essential (primary) hypertension: Secondary | ICD-10-CM | POA: Insufficient documentation

## 2014-12-22 DIAGNOSIS — R1031 Right lower quadrant pain: Secondary | ICD-10-CM | POA: Insufficient documentation

## 2014-12-22 LAB — URINALYSIS, REFLEX TO MICROSCOPIC EXAM IF INDICATED
Bilirubin, UA: NEGATIVE
Blood, UA: NEGATIVE
Glucose, UA: >=500 — AB
Ketones UA: NEGATIVE
Nitrite, UA: NEGATIVE
Protein, UR: NEGATIVE
Specific Gravity UA: 1.015 (ref 1.001–1.035)
Urine pH: 5 (ref 5.0–8.0)
Urobilinogen, UA: NORMAL mg/dL

## 2014-12-22 LAB — GFR: EGFR: >60

## 2014-12-22 LAB — CBC AND DIFFERENTIAL
Basophils Absolute Automated: 0.01 10*3/uL (ref 0.00–0.20)
Basophils Automated: 0 %
Eosinophils Absolute Automated: 0.14 10*3/uL (ref 0.00–0.70)
Eosinophils Automated: 2 %
Hematocrit: 38.2 % (ref 37.0–47.0)
Hgb: 12.7 g/dL (ref 12.0–16.0)
Immature Granulocytes Absolute: 0.02 10*3/uL
Immature Granulocytes: 0 %
Lymphocytes Absolute Automated: 1.67 10*3/uL (ref 0.50–4.40)
Lymphocytes Automated: 22 %
MCH: 26.6 pg — ABNORMAL LOW (ref 28.0–32.0)
MCHC: 33.2 g/dL (ref 32.0–36.0)
MCV: 80.1 fL (ref 80.0–100.0)
MPV: 11.7 fL (ref 9.4–12.3)
Monocytes Absolute Automated: 0.33 10*3/uL (ref 0.00–1.20)
Monocytes: 4 %
Neutrophils Absolute: 5.45 10*3/uL (ref 1.80–8.10)
Neutrophils: 72 %
Nucleated RBC: 0 /100 WBC (ref 0–1)
Platelets: 183 10*3/uL (ref 140–400)
RBC: 4.77 10*6/uL (ref 4.20–5.40)
RDW: 15 % (ref 12–15)
WBC: 7.6 10*3/uL (ref 3.50–10.80)

## 2014-12-22 LAB — BASIC METABOLIC PANEL
Anion Gap: 14 (ref 5.0–15.0)
BUN: 9 mg/dL (ref 7–19)
CO2: 22 mEq/L (ref 22–29)
Calcium: 9.3 mg/dL (ref 8.5–10.5)
Chloride: 99 mEq/L — ABNORMAL LOW (ref 100–111)
Creatinine: 0.7 mg/dL (ref 0.6–1.0)
Glucose: 285 mg/dL — ABNORMAL HIGH (ref 70–100)
Potassium: 4 mEq/L (ref 3.5–5.1)
Sodium: 135 mEq/L — ABNORMAL LOW (ref 136–145)

## 2014-12-22 LAB — GLUCOSE WHOLE BLOOD - POCT: Whole Blood Glucose POCT: 281 mg/dL — ABNORMAL HIGH (ref 70–100)

## 2014-12-22 LAB — HCG, SERUM, QUALITATIVE: Hcg Qualitative: NEGATIVE

## 2014-12-22 MED ORDER — SODIUM CHLORIDE 0.9 % IV BOLUS
1000.0000 mL | Freq: Once | INTRAVENOUS | Status: DC
Start: 2014-12-22 — End: 2014-12-22

## 2014-12-22 MED ORDER — ONDANSETRON 4 MG PO TBDP
4.0000 mg | ORAL_TABLET | Freq: Four times a day (QID) | ORAL | Status: AC | PRN
Start: 2014-12-22 — End: ?

## 2014-12-22 MED ORDER — MORPHINE SULFATE 4 MG/ML IJ/IV SOLN (WRAP)
4.0000 mg | Freq: Once | Status: AC
Start: 2014-12-22 — End: 2014-12-22
  Administered 2014-12-22: 4 mg via INTRAVENOUS
  Filled 2014-12-22: qty 1

## 2014-12-22 MED ORDER — IOHEXOL 300 MG/ML IJ SOLN
30.0000 mL | Freq: Once | INTRAMUSCULAR | Status: AC
Start: 2014-12-22 — End: 2014-12-22
  Administered 2014-12-22: 30 mL via ORAL

## 2014-12-22 MED ORDER — SODIUM CHLORIDE 0.9 % IV BOLUS
1000.0000 mL | Freq: Once | INTRAVENOUS | Status: AC
Start: 2014-12-22 — End: 2014-12-22
  Administered 2014-12-22: 1000 mL via INTRAVENOUS

## 2014-12-22 MED ORDER — HYDROMORPHONE HCL 0.5 MG/0.5 ML IJ SOLN
0.5000 mg | Freq: Once | INTRAMUSCULAR | Status: AC
Start: 2014-12-22 — End: 2014-12-22
  Administered 2014-12-22: 0.5 mg via INTRAVENOUS
  Filled 2014-12-22: qty 0.5

## 2014-12-22 MED ORDER — ONDANSETRON HCL 4 MG/2ML IJ SOLN
4.0000 mg | Freq: Once | INTRAMUSCULAR | Status: AC
Start: 2014-12-22 — End: 2014-12-22
  Administered 2014-12-22: 4 mg via INTRAVENOUS
  Filled 2014-12-22: qty 2

## 2014-12-22 MED ORDER — ACETAMINOPHEN-CODEINE 300-30 MG PO TABS
1.0000 | ORAL_TABLET | Freq: Four times a day (QID) | ORAL | Status: DC | PRN
Start: 2014-12-22 — End: 2017-05-10

## 2014-12-22 MED ORDER — IOHEXOL 350 MG/ML IV SOLN
95.0000 mL | Freq: Once | INTRAVENOUS | Status: AC | PRN
Start: 2014-12-22 — End: 2014-12-22
  Administered 2014-12-22: 95 mL via INTRAVENOUS

## 2014-12-22 NOTE — ED Student (Addendum)
[facility] EMERGENCY DEPARTMENT     Physician/Midlevel provider first contact with patient: 12/22/14 0117         Abdominal Pain  HPI: Alicia Ford is a 29 y.o. female w/hx of HTN, DM, hyperlipidemia, bipolar and anxiety/depression presents to the ED with complaint of RLQ abdominal pain.  Onset 1500 today, gradual, progressively worse.  +fever of 99.5, +nausea. Denies recent URI or ill contacts.  +hx of ovarian cysts but sx different.  Taken tylenol with no relief.       ROS:  Constitutional: Negative chills and +dec appetite.   HENT: Negative for congestion and sore throat.  Respiratory: Negative for cough and SOB.  GI: Neg for vomiting and diarrhea.    GU: Neg dysuria, frequency, urgency, vag discharge.   Neurological: Negative for dizziness, weakness, numbness.   All other systems negative.    Past Medical and Surgical History  Past Medical History   Diagnosis Date   . Diabetes mellitus    . Hypertension    . Hyperlipidemia    . Depression    . Anxiety    . Bipolar 1 disorder    . Schizoaffective disorder    . Peripheral vascular disease      Past Surgical History   Procedure Laterality Date   . Cholecystectomy     . Hernia repair     . Vein removal      . Ovarian cyst drainage        If the past medical or past surgical history sections read "No history on file" it reflects documentation that the histories were verbally investigated but the patient denies any past medical or surgical history.     Social   Social History     Social History   . Marital Status: Single     Spouse Name: N/A   . Number of Children: N/A   . Years of Education: N/A     Occupational History   . Not on file.     Social History Main Topics   . Smoking status: Passive Smoke Exposure - Never Smoker   . Smokeless tobacco: Not on file   . Alcohol Use: No   . Drug Use: Not on file   . Sexual Activity: Not on file     Other Topics Concern   . Not on file     Social History Narrative   . No narrative on file       Family History  No family  history on file.  Additionally this patient's family history was also verbally investigated and if the section reads "No family history on file" it reflects that there is no family history of inherited diseases or disorders for this patient relevant to today's encounter.      Medications  No current facility-administered medications on file prior to encounter.     No current outpatient prescriptions on file prior to encounter.       Allergies  Allergies   Allergen Reactions   . Geodon [Ziprasidone]    . Haldol [Haloperidol]    . Tramadol        PHYSICAL EXAM:    Patient Vitals for the past 24 hrs:   BP Temp Temp src Pulse Resp SpO2 Height Weight   12/22/14 0130 (!) 151/102 mmHg - - (!) 112 - 95 % - -   12/22/14 0116 (!) 151/102 mmHg 98.5 F (36.9 C) Oral (!) 113 16 97 % 5\' 4"  (1.626 m) 118.389 kg  CONSTITUTIONAL: Well developed, well nourished.  Awake & alert.  Mild acute distress, not diaphoretic. Obese.     HEAD: Atraumatic, Normocephalic.    EYES: Extra-ocular muscles are intact.  Sclera are anicteric.     ENT: Mucous membranes are moist and intact.  External inspection of the ears reveals no abnormality.    NECK:  Neck is supple without meningismus.    CARDIOVASCULAR: +tachy. Regular rhythm, no murmurs, rubs, or gallops.  Peripheral pulses are 2+ and equal.     PULMONARY/CHEST:  Lungs CTA bilat. No wheezing, rales or rhonchi.   Patient is speaking in full sentences without accessory muscle usage.      ABDOMINAL: Soft and non-distended.  +RLQ tenderness. No rebound, guarding, or rigidity. No pulsitile mass.      BACK:  No CVA tenderness.      MUSCULOSKELETAL: Full range of motion in all extremities.  No peripheral edema or muscular tenderness.     SKIN:  Skin is warm and dry.  No diaphoresis, rashes, or lesions.      NEUROLOGICAL: Alert and awake and oriented. Equal strength in the upper and lower extremities. Normal speech and sensorium and no acute focal deficits are noted.     Labs:  Results      Procedure Component Value Units Date/Time    Basic Metabolic Panel [[id number]]  (Abnormal) Collected:  12/22/14 0134    Specimen Information:  Blood Updated:  12/22/14 0237     Glucose 285 (H) mg/dL      BUN 9 mg/dL      Creatinine 0.7 mg/dL      Calcium 9.3 mg/dL      Sodium 191 (L) mEq/L      Potassium 4.0 mEq/L      Chloride 99 (L) mEq/L      CO2 22 mEq/L      Anion Gap 14.0     GFR [[id number]] Collected:  12/22/14 0134     EGFR >60.0 Updated:  12/22/14 0237    UA, Reflex to Microscopic (pts 3 + yrs) [[id number]]  (Abnormal) Collected:  12/22/14 0134    Specimen Information:  Urine Updated:  12/22/14 0217     Urine Type Clean Catch      Color, UA Yellow      Clarity, UA Hazy      Specific Gravity UA 1.015      Urine pH 5.0      Leukocyte Esterase, UA Small (A)      Nitrite, UA Negative      Protein, UR Negative      Glucose, UA >=500 (A)      Ketones UA Negative      Urobilinogen, UA Normal mg/dL      Bilirubin, UA Negative      Blood, UA Negative      RBC, UA 0 - 5 /hpf      WBC, UA 6 - 10 (A) /hpf      Squamous Epithelial Cells, Urine 6 - 10 /hpf     Beta HCG, Qual, Serum [[id number]] Collected:  12/22/14 0134    Specimen Information:  Blood Updated:  12/22/14 0151     Hcg Qualitative Negative     CBC and differential [[id number]]  (Abnormal) Collected:  12/22/14 0134    Specimen Information:  Blood from Blood Updated:  12/22/14 0140     WBC 7.60 x10 3/uL      Hgb 12.7 g/dL      Hematocrit 25.3 %  Platelets 183 x10 3/uL      RBC 4.77 x10 6/uL      MCV 80.1 fL      MCH 26.6 (L) pg      MCHC 33.2 g/dL      RDW 15 %      MPV 11.7 fL      Neutrophils 72 %      Lymphocytes Automated 22 %      Monocytes 4 %      Eosinophils Automated 2 %      Basophils Automated 0 %      Immature Granulocyte 0 %      Nucleated RBC 0 /100 WBC      Neutrophils Absolute 5.45 x10 3/uL      Abs Lymph Automated 1.67 x10 3/uL      Abs Mono Automated 0.33 x10 3/uL      Abs Eos Automated 0.14 x10 3/uL      Absolute Baso Automated 0.01 x10  3/uL      Absolute Immature Granulocyte 0.02 x10 3/uL     Glucose Whole Blood - POCT [[id number]]  (Abnormal) Collected:  12/22/14 0125     POCT - Glucose Whole blood 281 (H) mg/dL Updated:  59/56/38 7564          Radiology: CT pending   CT Abd/Pelvis with IV and PO Contrast   Final Result      No acute abnormality.      Nonacute findings detailed above.      Johnsie Kindred, MD    12/22/2014 4:11 AM           Differential Diagnosis: appendicitis, ovarian cyst, kidney stone, diverticulitis (unlikely), pancreatitis, bowel perforation, ulcers, bowel obstruction, aneurysm, uti      Documentation Review:   I have examined the available pertinent past medical records from previous ED visits, hospital admissions, or clinic visits; the information obtained has contributed to PMSHx.     The nursing notes for this patient's current visit have been reviewed, and any pertinent information has been incorporated into this note, particularly the vital signs, PMSFSHx, and initial presenting complaint.      MEDICAL DECISION MAKING / ED COURSE    89 yoF p/w RLQ pain suspicious for appy vs ovarian cyst.  Pt recently moved here from [place]; has established PCP but has not had an appt.  Pain controlled w/meds given in ER. Resting comfortable while awaiting CT Results.     Sign out given to Dr. Yetta Barre @ 0400.  0415: CT resulted.   Pt asleep upon entering room. In NAD, feels comfortable going home.     The patient presents with abdominal pain without signs of peritonitis or other life-threatening or serious etiology. Exact cause of abd pain unknown at this time. The patient appears stable for discharge and has been instructed to return immediately if the symptoms worsen or change in any way. Abdominal pain warnings were given and I emphasized the need for close follow-up with their primary care physician.      Meds given in ED:  Medications   sodium chloride 0.9 % bolus 1,000 mL (1,000 mLs Intravenous New Bag 12/22/14 0145)   ondansetron  (ZOFRAN) injection 4 mg (4 mg Intravenous Given 12/22/14 0146)   morphine injection 4 mg (4 mg Intravenous Given 12/22/14 0146)   iohexol (OMNIPAQUE) 300 MG/ML injection 30 mL (30 mLs Oral Imaging Agent Given 12/22/14 0148)   HYDROmorphone (DILAUDID) injection 0.5 mg (0.5 mg Intravenous Given 12/22/14  0232)   iohexol (OMNIPAQUE) 350 MG/ML injection 95 mL (95 mLs Intravenous Imaging Agent Given 12/22/14 0347)       FINAL DIAGNOSIS:  1. Right lower quadrant abdominal pain          DISPOSITION     Destination: Home.     Discharge medications:  zofran prn.   Tylenol #3 for severe pain.       I agree with Lance Sell NP for her patient encounter of Alicia Ford 12-22-14

## 2014-12-22 NOTE — ED Notes (Signed)
Patient reports RLQ pain starting at 1500, reports +nausea, denies vomiting or diarrhea, denies urinary s/s. Patient has a hx of ovarian cysts.

## 2014-12-22 NOTE — Discharge Instructions (Signed)
Abdominal Pain    You have been diagnosed with abdominal (belly) pain. The cause of your pain is not yet known.    Many things can cause abdominal pain. Examples include viral infections and bowel (intestine) spasms. You might need another examination or more tests to find out why you have pain.    At this time, your pain does not seem to be caused by anything dangerous. You do not need surgery. You do not need to stay in the hospital.     Though we don't believe your condition is dangerous right now, it is important to be careful. Sometimes a problem that seems mild can become serious later. This is why it is very important that you return here or go to the nearest Emergency Department unless you are 100% improved.    Return here or go to the nearest Emergency Department, or follow up with your physician in:   24 hours.    Drink only clear liquids such as water, clear broth, sports drinks, or clear caffeine-free soft drinks, like 7-Up or Sprite, for the next:   24 hours.    YOU SHOULD SEEK MEDICAL ATTENTION IMMEDIATELY, EITHER HERE OR AT THE NEAREST EMERGENCY DEPARTMENT, IF ANY OF THE FOLLOWING OCCURS:   Your pain does not go away or gets worse.   You cannot keep fluids down or your vomit is dark green.    You vomit blood or see blood in your stool. Blood might be bright red or dark red. It can also be black and look like tar.   You have a fever (temperature higher than 100.3F / 38C) or shaking chills.   Your skin or eyes look yellow or your urine looks brown.   You have severe diarrhea.      Dear Alicia Ford,    You were seen today by Lance Sell, NP. Thank you for choosing the [facility] for your healthcare needs.  We hope your visit today was EXCELLENT.    Rest, limit activity.  Follow up with clinic or primary care doctor.   Please take any medications prescribed as directed.     If you have any questions or concerns, I am available at [phone]. Please do not  hesitate to contact me if I can be of assistance.     Below is some information and resources that our patients often find helpful.    Sincerely,    Lance Sell, Nurse Practitioner  [facility] Department of Emergency Medicine    ________________________________________________________________  Thank you for choosing [facility] for your emergency care needs.  We strive to provide EXCELLENT care to you and your family.      If you do not continue to improve or your condition worsens, please contact your doctor or return immediately to the Emergency Department.    DOCTOR REFERRALS  Call [phone] (available 24 hours a day, 7 days a week) if you need any further referrals and we can help you find a primary care doctor or specialist.  Also, available online at:  [email or web address]    YOUR CONTACT INFORMATION  Before leaving please check with registration to make sure we have an up-to-date contact number.  You can call registration at [phone] to update your information.  For questions about your hospital bill, please call [phone].  For questions about your Emergency Dept Physician bill please call [phone].      FREE HEALTH SERVICES  If  you need help with health or social services, please call 2-1-1 for a free referral to resources in your area.  2-1-1 is a free service connecting people with information on health insurance, free clinics, pregnancy, mental health, dental care, food assistance, housing, and substance abuse counseling.  Also, available online at:  http://www.211[place].org    MEDICAL RECORDS AND TESTS  Certain laboratory test results do not come back the same day, for example urine cultures.  We will contact you if other important findings are noted. Radiology films are often reviewed again to ensure accuracy.  If there is any discrepancy, we will notify you.    Please call [phone] to pick up a complimentary CD of any radiology studies  performed.  If you or your doctor would like to request a copy of your medical records, please call [phone].      ORTHOPEDIC INJURY   Please know that significant injuries can exist even when an initial x-ray is read as normal or negative.  This can occur because some fractures (broken bones) are not initially visible on x-rays.  For this reason, close outpatient follow-up with your primary care doctor or bone specialist (orthopedist) is required.    MEDICATIONS AND FOLLOWUP  Please be aware that some prescription medications can cause drowsiness.  Use caution when driving or operating machinery.    The examination and treatment you have received in our Emergency Department is provided on an emergency basis, and is not intended to be a substitute for your primary care physician.  It is important that your doctor checks you again and that you report any new or remaining problems at that time.      24 HOUR PHARMACIES  [facility] [address] [place], [place] [address] (1.4 miles, 7 minutes)  [facility] - [address], [place], [place] [address] (6.5 miles, 13 minutes)  Handout with directions available on request.

## 2015-01-06 NOTE — ED Provider Notes (Signed)
I agree with Lance Sell NP on her patient encounter of Alicia Ford 12-22-14    Venancio Poisson, MD  01/06/15 [phone]

## 2015-01-08 ENCOUNTER — Emergency Department (EMERGENCY_DEPARTMENT_HOSPITAL): Payer: Medicare Other

## 2015-01-08 ENCOUNTER — Emergency Department (HOSPITAL_COMMUNITY)
Admission: EM | Admit: 2015-01-08 | Discharge: 2015-01-08 | Disposition: A | Discharge status: Home / Self Care | Payer: Medicare Other | Attending: Emergency Medicine | Admitting: Emergency Medicine

## 2015-01-08 ENCOUNTER — Encounter (HOSPITAL_COMMUNITY): Payer: Self-pay | Admitting: *Deleted

## 2015-01-08 DIAGNOSIS — F209 Schizophrenia, unspecified: Secondary | ICD-10-CM | POA: Diagnosis not present

## 2015-01-08 DIAGNOSIS — E669 Obesity, unspecified: Secondary | ICD-10-CM | POA: Diagnosis not present

## 2015-01-08 DIAGNOSIS — M79605 Pain in left leg: Secondary | ICD-10-CM | POA: Insufficient documentation

## 2015-01-08 DIAGNOSIS — E119 Type 2 diabetes mellitus without complications: Secondary | ICD-10-CM | POA: Diagnosis not present

## 2015-01-08 DIAGNOSIS — M545 Low back pain: Secondary | ICD-10-CM | POA: Diagnosis not present

## 2015-01-08 DIAGNOSIS — M79604 Pain in right leg: Secondary | ICD-10-CM | POA: Diagnosis not present

## 2015-01-08 DIAGNOSIS — M79609 Pain in unspecified limb: Secondary | ICD-10-CM

## 2015-01-08 DIAGNOSIS — R200 Anesthesia of skin: Secondary | ICD-10-CM | POA: Diagnosis not present

## 2015-01-08 DIAGNOSIS — Z79899 Other long term (current) drug therapy: Secondary | ICD-10-CM | POA: Insufficient documentation

## 2015-01-08 DIAGNOSIS — Z8589 Personal history of malignant neoplasm of other organs and systems: Secondary | ICD-10-CM | POA: Insufficient documentation

## 2015-01-08 DIAGNOSIS — M7989 Other specified soft tissue disorders: Secondary | ICD-10-CM | POA: Diagnosis present

## 2015-01-08 DIAGNOSIS — F419 Anxiety disorder, unspecified: Secondary | ICD-10-CM | POA: Insufficient documentation

## 2015-01-08 DIAGNOSIS — F319 Bipolar disorder, unspecified: Secondary | ICD-10-CM | POA: Insufficient documentation

## 2015-01-08 LAB — BASIC METABOLIC PANEL
Anion gap: 10 (ref 5–15)
BUN: 6 mg/dL (ref 6–20)
CALCIUM: 8.8 mg/dL — AB (ref 8.9–10.3)
CHLORIDE: 104 mmol/L (ref 101–111)
CO2: 22 mmol/L (ref 22–32)
CREATININE: 0.58 mg/dL (ref 0.44–1.00)
GFR calc non Af Amer: >60 mL/min (ref >60)
GLUCOSE: 259 mg/dL — AB (ref 65–99)
Potassium: 4.1 mmol/L (ref 3.5–5.1)
Sodium: 136 mmol/L (ref 135–145)

## 2015-01-08 LAB — CBC
HCT: 37.7 % (ref 36.0–46.0)
Hemoglobin: 12 g/dL (ref 12.0–15.0)
MCH: 26.3 pg (ref 26.0–34.0)
MCHC: 31.8 g/dL (ref 30.0–36.0)
MCV: 82.5 fL (ref 78.0–100.0)
Platelets: 160 10*3/uL (ref 150–400)
RBC: 4.57 MIL/uL (ref 3.87–5.11)
RDW: 15.1 % (ref 11.5–15.5)
WBC: 5.7 10*3/uL (ref 4.0–10.5)

## 2015-01-08 LAB — URINALYSIS, ROUTINE W REFLEX MICROSCOPIC
Bilirubin Urine: NEGATIVE
Glucose, UA: 250 mg/dL — AB
KETONES UR: 15 mg/dL — AB
NITRITE: NEGATIVE
Protein, ur: NEGATIVE mg/dL
Specific Gravity, Urine: 1.027 (ref 1.005–1.030)
pH: 5.5 (ref 5.0–8.0)

## 2015-01-08 LAB — CBG MONITORING, ED: GLUCOSE-CAPILLARY: 247 mg/dL — AB (ref 65–99)

## 2015-01-08 LAB — URINE MICROSCOPIC-ADD ON

## 2015-01-08 MED ORDER — IBUPROFEN 800 MG PO TABS: 800.0000 mg | ORAL_TABLET | Freq: Three times a day (TID) | ORAL | Status: DC | PRN

## 2015-01-08 MED ORDER — IBUPROFEN 400 MG PO TABS
800.0000 mg | ORAL_TABLET | Freq: Once | ORAL | Status: AC
  Administered 2015-01-08: 800 mg via ORAL
  Filled 2015-01-08: qty 4

## 2015-01-08 NOTE — ED Notes (Addendum)
Pt reports a knot on her left leg and tenderness to palpation. Pt reports swelling in that leg as well. Pt reports generalized not feeling well. Pt states that her CBG has been high as well.

## 2015-01-08 NOTE — ED Provider Notes (Signed)
CSN: [id number]     Arrival date & time 01/08/15  1525 History  By signing my name below, I, Starleen Arms, attest that this documentation has been prepared under the direction and in the presence of Domenic Moras, PA-C. Electronically Signed: Starleen Arms ED Scribe. 01/08/2015. 4:35 PM.    Chief Complaint  Patient presents with  . Leg Swelling   The history is provided by the patient. No language interpreter was used.   HPI Comments: Tina Saunders is a 29 y.o. female who presents to the Emergency Department complaining of constant, squeezing/throbbing, 9/10, left > right lower leg swelling and pain onset 1 month ago with worsening two days ago.  Associated symptoms include mild numbness in the left foot and mild ,normal to baseline lower back pain.  She has used ibuprofen and Tylenol without relief.  She reports hx of varicose veins but no hx of DVT/PE.  She denies injury.  Patient is not currently pregnant.  No fever, chills, or rash.  Pt also report hx of peripheral vascular disease and takes gabapentin for her pain.  Pt sts she needs narcotics to help subside her pain.  Pt report she is new to the area and have not established a primary care provider yet.       Past Medical History  Diagnosis Date  . Polycystic ovarian syndrome 07/01/2011    Patient report  . Anxiety   . Depression   . Hypertension   . Obesity   . Schizophrenia ([place])   . Bipolar 1 disorder ([place])   . Obesity   . 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 Diabetes mellitus without complication [facility])    Past Surgical History  Procedure Laterality Date  . Cholecystectomy    . Varicose vein surgery    . Ovarian cyst excision    . Hernia repair     Family History  Problem Relation Age of Onset  . Coronary artery disease Maternal Grandmother   . Diabetes type II Maternal Grandmother   . Cancer Maternal Grandmother   . Hypertension Mother   . Hypertension Father    Social History  Substance Use  Topics  . Smoking status: Never Smoker   . Smokeless tobacco: Never Used  . Alcohol Use: No   OB History    Gravida Para Term Preterm AB TAB SAB Ectopic Multiple Living   0              Review of Systems  Cardiovascular: Positive for leg swelling.  Musculoskeletal: Positive for myalgias and back pain.      Allergies  Fish-derived products; Geodon; Haldol; Compazine; Toradol; and Buprenorphine hcl  Home Medications   Prior to Admission medications   Medication Sig Start Date End Date Taking? Authorizing Provider  busPIRone (BUSPAR) 15 MG tablet Take 15 mg by mouth 3 (three) times daily.    Historical Provider, MD  clonazePAM (KLONOPIN) 0.5 MG tablet Take 1 tablet (0.5 mg total) by mouth 2 (two) times daily. Patient not taking: Reported on 07/09/2014 03/03/14   Wandra Arthurs, MD  gabapentin (NEURONTIN) 400 MG capsule Take 2 capsules (800 mg total) by mouth at bedtime. Patient taking differently: Take 400-800 mg by mouth 4 (four) times daily. Take 400 mg three times daily; Take 800 mg at bedtime 05/24/14   Alvina Chou, PA-C  Melatonin 3 MG TABS Take 3 mg by mouth at bedtime as needed (for sleep).     Historical Provider,  MD  pantoprazole (PROTONIX) 40 MG tablet Take 40 mg by mouth at bedtime.    Historical Provider, MD  QUEtiapine (SEROQUEL XR) 400 MG 24 hr tablet Take 800 mg by mouth at bedtime.    Historical Provider, MD  sertraline (ZOLOFT) 50 MG tablet Take 150 mg by mouth at bedtime. 05/01/14   Historical Provider, MD   BP 145/67 mmHg  Pulse 109  Temp(Src) 98.5 F (36.9 C) (Oral)  Resp 18  SpO2 97% Physical Exam  Constitutional: She is oriented to person, place, and time. She appears well-developed and well-nourished. No distress.  HENT:  Head: Normocephalic and atraumatic.  Eyes: Conjunctivae and EOM are normal.  Neck: Neck supple. No tracheal deviation present.  Cardiovascular: Normal rate and intact distal pulses.   Pulmonary/Chest: Effort normal. No respiratory  distress.  Musculoskeletal: Normal range of motion. She exhibits no edema.  Diffuse TTP throughout without any overlying skin chagnes. No palpable chords, erythema, or edema.  Pedal pulses palpable.  FROM through all joints.   No significant midline spine tenderness  Neurological: She is alert and oriented to person, place, and time.  Skin: Skin is warm and dry.  Psychiatric: She has a normal mood and affect. Her behavior is normal.  Nursing note and vitals reviewed.   ED Course  Procedures (including critical care time)  DIAGNOSTIC STUDIES: Oxygen Saturation is 97% on RA, normal by my interpretation.    COORDINATION OF CARE:  4:33 PM Discussed low suspicion for DVT, however, patient stated she would prefer to have US performed.  Will order ibuprofen for pain control.   Labs Review Labs Reviewed  CBG MONITORING, ED - Abnormal; Notable for the following:    Glucose-Capillary 247 (*)    All other components within normal limits  CBC  BASIC METABOLIC PANEL  URINALYSIS, ROUTINE W REFLEX MICROSCOPIC (NOT AT [facility])   Patient Name Sex DOB SSN    Tina Saunders, Tina Saunders Female Apr 21, 1985 [id number]    Progress Notes by Dareen Piano at 01/08/2015 7:11 PM    Author: Dareen Piano Service: Vascular Lab Author Type: Cardiovascular Sonographer   Filed: 01/08/2015 7:11 PM Note Time: 01/08/2015 7:11 PM Status: Signed   Editor: Vilma Prader Slaughter (Cardiovascular Sonographer)     Expand All Collapse All   VASCULAR LAB PRELIMINARY PRELIMINARY PRELIMINARY PRELIMINARY  Bilateral lower extremity venous duplex completed.   Preliminary report: Bilateral: No evidence of DVT, superficial thrombosis, or Baker's Cyst.   SLAUGHTER, [place], RVS 01/08/2015, 7:11 PM      Imaging Review No results found. I have personally reviewed and evaluated these images and lab results as part of my medical decision-making.  MDM   Final diagnoses:  Bilateral leg pain    BP  135/81 mmHg  Pulse 104  Temp(Src) 98.5 F (36.9 C) (Oral)  Resp 22  SpO2 97%   I personally performed the services described in this documentation, which was scribed in my presence. The recorded information has been reviewed and is accurate.     6:03 PM Pt here with bilateral leg pain L>R.  She is NVI, no significant edema on exam, no signs of infection.  No radicular pain.  Vascular US ordered to r/o DVT.    Pt request narcotic pain meds, which i do not think is appropriate in this setting.  Pt demanded to see another provider.  Dr. Alvino Chapel has seen and evaluated pt and agrees with plan of no narcotic.    7:29 PM LE vascular study showing  no evidence of DVT.  UA without infection.  CBG is 247, without anion gap.  Therefore, recommend pt to f/u with PCP for further care.  Resources provided.     Domenic Moras, PA-C 01/08/15 1936  Davonna Belling, MD 01/08/15 [phone]

## 2015-01-08 NOTE — Discharge Instructions (Signed)
Please use resources below to find a primary care provider who can manage your health.  Take ibuprofen as needed for pain.   Emergency Department Resource Guide 1) Find a Doctor and Pay Out of Pocket Although you won't have to find out who is covered by your insurance plan, it is a good idea to ask around and get recommendations. You will then need to call the office and see if the doctor you have chosen will accept you as a new patient and what types of options they offer for patients who are self-pay. Some doctors offer discounts or will set up payment plans for their patients who do not have insurance, but you will need to ask so you aren't surprised when you get to your appointment.  2) Contact Your Local Health Department Not all health departments have doctors that can see patients for sick visits, but many do, so it is worth a call to see if yours does. If you don't know where your local health department is, you can check in your phone book. The CDC also has a tool to help you locate your state's health department, and many state websites also have listings of all of their local health departments.  3) Find a [facility] If your illness is not likely to be very severe or complicated, you may want to try a walk in clinic. These are popping up all over the country in pharmacies, drugstores, and shopping centers. They're usually staffed by nurse practitioners or physician assistants that have been trained to treat common illnesses and complaints. They're usually fairly quick and inexpensive. However, if you have serious medical issues or chronic medical problems, these are probably not your best option.  No Primary Care Doctor: - Call Health Connect at  [phone] - they can help you locate a primary care doctor that  accepts your insurance, provides certain services, etc. - Physician Referral Service- [phone]  Chronic Pain Problems: Organization         Address  Phone   Notes  [facility]  [phone] Patients need to be referred by their primary care doctor.   Medication Assistance: Organization         Address  Phone   Notes  [facility] [address] [place], [place] [address] [phone] --Must be a resident of [facility] -- Must have NO insurance coverage whatsoever (no Medicaid/ Medicare, etc.) -- The pt. MUST have a primary care doctor that directs their care regularly and follows them in the community   MedAssist  [phone]   [employer]  [phone]    Agencies that provide inexpensive medical care: Organization         Address  Phone   Notes  [place]  [phone]   [facility]    [phone]   [facility] [place], [place] [address] [phone]   [place] [address], [place] [phone]   Planned Parenthood    7277238711   [facility]    [phone]   [place] and [place] [address], [place] Phone:  [phone], Fax:  [phone] Hours of Operation:  9 am - 6 pm, M-F.  Also accepts Medicaid/Medicare and self-pay.  [facility] [place], [address], [place] Phone: [phone],  Fax: [phone]. Hours of Operation:  8:30 am - 5:30 pm, M-F.  Also accepts Medicaid and self-pay.  [facility] [address] [place] Phone: [phone]   [place], [place], [place] [phone], Ext. [phone] Mondays & Thursdays: 7-9 AM.  First 15 patients are seen on a first come, first serve basis.    [place] Providers:  Organization         Address  Phone   Notes  [facility] [address], [place] [phone] Also accepts self-pay patients.  [facility] [id number] [place], [place]  [phone]   [place], [address], [place] [phone]   [facility] [address], [place] [phone]   Lucianne Lei [address], [place]   [phone] Only accepts [place] Access [place] patients after they have their name applied to their card.   Self-Pay (no insurance) in [facility]:  Organization         Address  Phone   Notes  Sickle Cell Patients, [facility] [place] [phone]   [facility] [place] [phone]   [facility] [place]  [place], [place], [place] [phone]   [facility]/Dr. Osei-Bonsu  [address] [place] or [address], [place] [phone] Phone number for both [place] and [place] locations is the same.  Urgent Medical and [facility] [address] [place] [phone]   [facility] [address] [place] or [address] [phone]   [facility] [address], [place] [phone], phone; [phone], fax Sees patients 1st and 3rd Saturday of every month.  Must not qualify for public or private insurance (i.e. Medicaid, Medicare, [place] Health Choice, Veterans' Benefits)  Household income should be no more than 200% of the poverty level The clinic cannot treat you if you are pregnant or think you are pregnant  Sexually transmitted diseases are not treated at the clinic.    Dental Care: Organization         Address  Phone  Notes  [facility] Department of [facility] [place] [phone] Accepts children up to age 15 who are enrolled in [place] or [place]; pregnant women with a Medicaid card; and children who have applied for  Medicaid or [place] Health Choice, but were declined, whose parents can pay a reduced fee at time of service.  [facility] Department of [facility]  [address], [place] [phone] Accepts children up to age 98 who are enrolled in [place] or [place]; pregnant women with a Medicaid card; and children who have applied for Medicaid or [place] Health Choice, but were declined, whose parents can pay a reduced fee at time of service.  [place] Adult [facility] Access PROGRAM  [place] [phone] Patients are seen by appointment only. Walk-ins are not accepted. [place] will see patients 73 years of age and older. Monday - Tuesday (8am-5pm) Most Wednesdays (8:30-5pm) $30 per visit, cash only  [place] Adult [employer] PROGRAM  [address], [employer] (  [phone] Patients are seen by appointment only. Walk-ins are not accepted. [place] will see patients 46 years of age and older. One Wednesday Evening (Monthly: Volunteer Based).  $30 per visit, cash only  [place]  [phone] for adults; Children under age 55, call Graduate Pediatric Dentistry at [phone]. Children aged 45-14, please call [phone] to request a pediatric application.  Dental services are provided in all areas of dental care including fillings, crowns and bridges, complete and partial dentures, implants, gum treatment, root canals, and extractions. Preventive care is also provided. Treatment is provided to both adults and children. Patients are selected via a lottery and there is often a waiting list.   [facility] [address] [place]  [phone] [email or web address]   [facility] [address] [place], [place] [phone], Ext. [phone] Second and Fourth Thursday of each month, opens at 6:30 AM; Clinic ends at 9 AM.  Patients are seen on a first-come first-served basis, and a limited number  are seen during each clinic.   [facility]  [address] Hillard Danker [place], [place] [phone]   Eligibility Requirements You must have lived in [place], [place], or [place] counties for at least the last three months.   You cannot be eligible for state or federal sponsored [employer], including [employer], [place], or [employer].   You generally cannot be eligible for healthcare insurance through your employer.    How to apply: Eligibility screenings are held every Tuesday and Wednesday afternoon from 1:00 pm until 4:00 pm. You do not need an appointment for the interview!  [facility] [address], [place], [place]   [place]  [place] Department  [place]  [phone]    Behavioral Health Resources in the Community: Intensive Outpatient Programs Organization         Address  Phone  Notes  [place]. [address], [place], [place] [phone]   [facility] Outpatient [address] [place], [place]   ADS: Alcohol & Drug Svcs [address], [place], [place]   [place] [address],  [place], [place] or [phone]   Substance Abuse Resources Organization         Address  Phone  Notes  Alcohol and Drug Services  [phone]   [place]  [phone]   [facility] [place]   Chinita Pester  [phone]   Residential & Outpatient Substance Abuse Program  [phone]   Psychological Services Organization         Address  Phone  Notes  [facility] [place]  [place]  [phone]   [place] [address] [place] or [phone]    Mobile Crisis Teams Organization         Address  Phone  Notes  Therapeutic  Alternatives, Mobile Crisis Care Unit  [phone]   Assertive Psychotherapeutic Services  [address] [place], [place]   [place] [address], [place] [phone]    Self-Help/Support Groups Organization         Address  Phone             Notes  [place] of [place] - variety of support groups  336- H3156881 Call for more information  Narcotics Anonymous (NA), [facility] [address], [employer] [place]  2 meetings at this location   Residential Facilities manager         Address  Phone  Notes  [facility] [place],    [place]  1-(684)[phone]   [facility]  [address], [place] [phone], [place], [place]   [place], [place] [phone] Admissions: 8am-3pm M-F  Incentives Substance [place] [address]    [place], [place] [phone]   [facility] [address] [place], [place], [place]   [facility] [address],  [place], [place]   [facility]ight Programs - Intensive Outpatient [address], [place], [place]   [facility] [place].,  [place], [place] [phone] or [phone]   Residential Treatment Services (RTS) [address] [place], [place] Accepts Medicaid  [facility] [place] [address],  [place] [phone] Substance Abuse/Addiction Treatment   [facility] Organization         Address  Phone  Notes  [facility]  3074164422   Domenic Schwab, PhD [address] Arlis [place], [place]   [phone] or [phone]   [place], [place] [phone]   [facility] [address] [place], [place] [phone] Insurance/Medicaid/sponsorship through [facility]  [address] [place]                                    [place], [place] [phone] [place] [address], [place] [phone]    Dr. Adele Schilder  [phone]   [facility] Dept. 1) [address] [place] 2) [place] 3)  [address], [place] [phone]  [phone]   [place] [phone] or [phone] (After Hours)

## 2015-01-08 NOTE — Progress Notes (Signed)
VASCULAR LAB PRELIMINARY  PRELIMINARY  PRELIMINARY  PRELIMINARY  Bilateral lower extremity venous duplex completed.    Preliminary report:  Bilateral:  No evidence of DVT, superficial thrombosis, or Baker's Cyst.   Haru Shaff, RVS 01/08/2015, 7:11 PM

## 2015-01-08 NOTE — ED Notes (Signed)
MD at bedside with patient at this time.

## 2015-01-08 NOTE — ED Notes (Signed)
Pt given graham crackers and a diet coke per request and nurse approval.

## 2015-01-08 NOTE — ED Notes (Signed)
Pt made aware of delay and encouraged to take Ibuprofen while she waits for MD to see her.  Patient agreed.  Pt states she has a history of peripheral vascular disease, and take Gabapentin for her pain.  States she needs narcotics to help subside acute on chronic pain.  Patient states she is new to the area and has not established PCP yet.  Patient also states she is hoping to get established with a pain clinic.

## 2015-01-08 NOTE — ED Notes (Signed)
Pt is requesting a different provider.  Patient unhappy with current medications ordered.  Patient states she has been taking Ibuprofen at home.  Pt also states she felt provider was "rude".  This RN spoke to patient and allowed her to voice her concerns.  Dr. Alvino Chapel (attending) made aware and will come to see patient.

## 2015-03-21 ENCOUNTER — Emergency Department: Admit: 2015-03-22 | Payer: MEDICARE | Primary: Family Medicine

## 2015-03-21 DIAGNOSIS — N390 Urinary tract infection, site not specified: Secondary | ICD-10-CM

## 2015-03-21 NOTE — ED Provider Notes (Signed)
HPI Comments: Amanda Henderson is a 30 y.o. female, pmhx significant for HTN, DM, who presents via EMS to the ED c/o RLQ abdominal pain and nausea x 2 days. Pt states her temperature at home was 99.0, but no higher than that. Pt reports her last bowel movement was today, and denies any improvements in her pain. She denies chance of pregnancy and notes that her LMP was a few weeks ago. Pt denies fever, vomiting, vaginal discharge, and constipation. Pt does not complain of SOB, or chest pain.    PCP: No primary care provider on file.    Social Hx: -tobacco , -EtOH , -Illicit Drugs    There are no other complaints, changes, or physical findings at this time.       The history is provided by the patient. No language interpreter was used.        No past medical history on file.    No past surgical history on file.      No family history on file.    Social History     Social History   ??? Marital status: N/A     Spouse name: N/A   ??? Number of children: N/A   ??? Years of education: N/A     Occupational History   ??? Not on file.     Social History Main Topics   ??? Smoking status: Not on file   ??? Smokeless tobacco: Not on file   ??? Alcohol use Not on file   ??? Drug use: Not on file   ??? Sexual activity: Not on file     Other Topics Concern   ??? Not on file     Social History Narrative         ALLERGIES: Geodon [ziprasidone hcl]; Haldol [haloperidol lactate]; Ibuprofen; and Toradol [ketorolac]    Review of Systems   Constitutional: Negative for appetite change, chills, diaphoresis, fatigue and fever.   HENT: Negative for sore throat and trouble swallowing.    Respiratory: Negative for cough and shortness of breath.    Cardiovascular: Negative for chest pain.   Gastrointestinal: Positive for abdominal pain (RLQ) and nausea. Negative for constipation, diarrhea and vomiting.   Genitourinary: Negative for dysuria, frequency and vaginal discharge.   Musculoskeletal: Negative for arthralgias, back pain and myalgias.    Skin: Negative for color change and rash.   Neurological: Negative for weakness and numbness.   Hematological: Does not bruise/bleed easily.   All other systems reviewed and are negative.      Vitals:    03/21/15 2151   BP: 146/69   Pulse: 100   Resp: 20   Temp: 98.4 ??F (36.9 ??C)   SpO2: 99%   Weight: 117.4 kg (258 lb 12.8 oz)   Height: 5\' 4"  (1.626 m)            Physical Exam   Constitutional: She is oriented to person, place, and time. She appears well-developed and well-nourished.   No acute distress   HENT:   Posterior pharynx normal, no exudate   Neck: Normal range of motion. Neck supple.   Cardiovascular: Normal rate and regular rhythm.    Pulmonary/Chest: Effort normal and breath sounds normal. She has no rales.   Abdominal: Soft. Bowel sounds are normal. She exhibits no distension. There is no guarding.   RLQ tenderness   Genitourinary:   Genitourinary Comments: No CVAT   Musculoskeletal: Normal range of motion. She exhibits no edema, tenderness or deformity.  Neurological: She is alert and oriented to person, place, and time. She has normal reflexes.   Skin: Skin is warm. No rash noted. She is not diaphoretic. No erythema.   Nursing note and vitals reviewed.       MDM  Number of Diagnoses or Management Options  Diagnosis management comments: DDx: appendicitis, UTI, unlikely gastroenteritis        Amount and/or Complexity of Data Reviewed  Clinical lab tests: reviewed and ordered  Tests in the radiology section of CPT??: ordered and reviewed  Review and summarize past medical records: yes    Patient Progress  Patient progress: stable    ED Course       Procedures    PROGRESS NOTE   10:20 PM  Pt is requesting pain mediation at this time.  Written by Albertine Patricia, ED Scribe, as dictated by Blenda Mounts, MD.     PROGRESS NOTE   11:42 PM  Discussed imaging results with patient. Will discharge home with follow-up with PCP.  Written by Albertine Patricia, ED Scribe, as dictated by Blenda Mounts, MD.    LABORATORY TESTS:  Recent Results (from the past 12 hour(s))   METABOLIC PANEL, BASIC    Collection Time: 03/21/15 10:04 PM   Result Value Ref Range    Sodium 137 136 - 145 mmol/L    Potassium 3.8 3.5 - 5.1 mmol/L    Chloride 100 97 - 108 mmol/L    CO2 27 21 - 32 mmol/L    Anion gap 10 5 - 15 mmol/L    Glucose 260 (H) 65 - 100 mg/dL    BUN 8 6 - 20 MG/DL    Creatinine 7.84 6.96 - 1.02 MG/DL    BUN/Creatinine ratio 11 (L) 12 - 20      GFR est AA >60 >60 ml/min/1.72m2    GFR est non-AA >60 >60 ml/min/1.79m2    Calcium 8.9 8.5 - 10.1 MG/DL   CBC WITH AUTOMATED DIFF    Collection Time: 03/21/15 10:04 PM   Result Value Ref Range    WBC 5.9 3.6 - 11.0 K/uL    RBC 4.66 3.80 - 5.20 M/uL    HGB 12.7 11.5 - 16.0 g/dL    HCT 29.5 28.4 - 13.2 %    MCV 81.1 80.0 - 99.0 FL    MCH 27.3 26.0 - 34.0 PG    MCHC 33.6 30.0 - 36.5 g/dL    RDW 44.0 (H) 10.2 - 14.5 %    PLATELET 192 150 - 400 K/uL    NEUTROPHILS 70 32 - 75 %    LYMPHOCYTES 22 12 - 49 %    MONOCYTES 5 5 - 13 %    EOSINOPHILS 3 0 - 7 %    BASOPHILS 0 0 - 1 %    ABS. NEUTROPHILS 4.2 1.8 - 8.0 K/UL    ABS. LYMPHOCYTES 1.3 0.8 - 3.5 K/UL    ABS. MONOCYTES 0.3 0.0 - 1.0 K/UL    ABS. EOSINOPHILS 0.2 0.0 - 0.4 K/UL    ABS. BASOPHILS 0.0 0.0 - 0.1 K/UL   URINALYSIS W/ REFLEX CULTURE    Collection Time: 03/21/15 10:04 PM   Result Value Ref Range    Color YELLOW/STRAW      Appearance CLOUDY (A) CLEAR      Specific gravity 1.010 1.003 - 1.030      pH (UA) 5.0 5.0 - 8.0      Protein NEGATIVE  NEG mg/dL  Glucose NEGATIVE  NEG mg/dL    Ketone NEGATIVE  NEG mg/dL    Bilirubin NEGATIVE  NEG      Blood NEGATIVE  NEG      Urobilinogen 0.2 0.2 - 1.0 EU/dL    Nitrites NEGATIVE  NEG      Leukocyte Esterase SMALL (A) NEG      WBC 5-10 0 - 4 /hpf    RBC 0-5 0 - 5 /hpf    Epithelial cells FEW FEW /lpf    Bacteria NEGATIVE  NEG /hpf    UA:UC IF INDICATED URINE CULTURE ORDERED (A) CNI      Mucus 1+ (A) NEG /lpf   HCG URINE, QL. - POC    Collection Time: 03/21/15 10:35 PM    Result Value Ref Range    Pregnancy test,urine (POC) NEGATIVE  NEG         IMAGING RESULTS:  CT ABD PELV WO CONT   Final Result   INDICATION: Right lower abdominal pain for 2 days, nausea, vomiting  ??  COMPARISON: None  ??  TECHNIQUE:   Thin axial images were obtained through the abdomen and pelvis. Coronal and  sagittal reconstructions were generated. Oral contrast was not administered. CT  dose reduction was achieved through use of a standardized protocol tailored for  this examination and automatic exposure control for dose modulation.   ??  The absence of intravenous contrast material reduces the sensitivity for  evaluation of the solid parenchymal organs of the abdomen.   ??  FINDINGS:   LUNG BASES: Clear.  INCIDENTALLY IMAGED HEART AND MEDIASTINUM: Unremarkable.  LIVER: Hepatic steatosis is noted. No focal abnormality is identified.  GALLBLADDER: Surgically absent  SPLEEN: The spleen is enlarged measuring 19.3 cm. No focal abnormality is  identified.  PANCREAS: No mass or ductal dilatation.  ADRENALS: Unremarkable.  KIDNEYS/URETERS: No mass, calculus, or hydronephrosis.  STOMACH: Unremarkable.  SMALL BOWEL: No dilatation or wall thickening.  COLON: No dilatation or wall thickening.  APPENDIX: Unremarkable.  PERITONEUM: No ascites or pneumoperitoneum.  RETROPERITONEUM: No lymphadenopathy or aortic aneurysm.  REPRODUCTIVE ORGANS: There is no pelvic mass or lymphadenopathy.  URINARY BLADDER: No mass or calculus.  BONES: No destructive bone lesion.  ADDITIONAL COMMENTS: Ventral hernias noted containing only fat.  ??  IMPRESSION  IMPRESSION:  Hepatic steatosis. Ventral hernia containing only fat. Status post  cholecystectomy. Splenomegaly. No renal or ureteral stone or evidence of  Hydronephrosis.         MEDICATIONS GIVEN:  Medications   sodium chloride 0.9 % bolus infusion 1,000 mL (0 mL IntraVENous IV Completed 03/21/15 2243)   ciprofloxacin HCl (CIPRO) tablet 500 mg (not administered)    morphine injection 2 mg (2 mg IntraVENous Given 03/21/15 2240)       IMPRESSION:  1. Urinary tract infection without hematuria, site unspecified        PLAN:  1. Discharge home.  Current Discharge Medication List      START taking these medications    Details   ciprofloxacin HCl (CIPRO) 500 mg tablet Take 1 Tab by mouth two (2) times a day for 7 days.  Qty: 14 Tab, Refills: 0         CONTINUE these medications which have NOT CHANGED    Details   insulin detemir (LEVEMIR FLEXPEN) 100 unit/mL (3 mL) inpn by SubCUTAneous route.      cloNIDine HCl (CATAPRES) 0.1 mg tablet Take 0.1 mg by mouth two (2) times a day.  traZODone (DESYREL) 50 mg tablet Take 50 mg by mouth nightly.      busPIRone (BUSPAR) 15 mg tablet Take 15 mg by mouth three (3) times daily.      sertraline (ZOLOFT) 100 mg tablet Take 100 mg by mouth two (2) times a day.      fenofibrate nanocrystallized (TRICOR) 145 mg tablet Take 145 mg by mouth daily.      QUEtiapine SR (SEROQUEL XR) 400 mg sr tablet Take 800 mg by mouth nightly.      chlorproMAZINE (THORAZINE) 10 mg tablet Take 25 mg by mouth three (3) times daily.      pantoprazole (PROTONIX) 40 mg tablet Take 40 mg by mouth daily.      lisinopril-hydroCHLOROthiazide (PRINZIDE, ZESTORETIC) 20-12.5 mg per tablet Take 1 Tab by mouth daily.      insulin aspart (NOVOLOG) 100 unit/mL injection by SubCUTAneous route.           2.   Follow-up Information     Follow up With Details Comments Contact Info    Fay Records, MD   [address] [place] [address]  [phone]      Sharlene Motts, MD   [address]  [facility] [place] [address]  [phone]          3. Return to ED if worse       DISCHARGE NOTE:  11:43 PM  Patient's results have been reviewed with them. Patient and/or family have verbally conveyed their understanding and agreement of the patient's signs, symptoms, diagnosis, treatment and prognosis and additionally agree  to follow up as recommended or return to the Emergency Room should their condition change prior to follow-up. Discharge instructions have also been provided to the patient with some educational information regarding their diagnosis as well a list of reasons why they would want to return to the ER prior to their follow-up appointment should their condition change.     Written by Albertine Patricia, ED Scribe, as dictated by Blenda Mounts, MD.    Attestation:     This note is prepared by Albertine Patricia, acting as Scribe for Blenda Mounts, MD.    Blenda Mounts, MD: The scribe's documentation has been prepared under my direction and personally reviewed by me in its entirety. I confirm that the note above accurately reflects all work, treatment, procedures, and medical decision making performed by me.

## 2015-03-21 NOTE — ED Notes (Addendum)
Provider at bedside.

## 2015-03-21 NOTE — ED Notes (Signed)
Pt in ED w/ complaint of R sided abd pain w/ nausea & vomiting X 1 day. Pt denies taking anything for her symptoms. Pt denies dysuria, urinary frequency & urgency, and abnormal vaginal discharge. Pt is A&O X 4 and appears in no distress.    Emergency Department Nursing Plan of Care       The Nursing Plan of Care is developed from the Nursing assessment and Emergency Department Attending provider initial evaluation.  The plan of care may be reviewed in the ???ED Provider note???.    The Plan of Care was developed with the following considerations:   Patient / Family readiness to learn indicated [id number] understanding  Persons(s) to be included in education: patient  Barriers to Learning/Limitations:No    Signed     Abagail Kitchens, RN    03/21/2015   10:57 PM

## 2015-03-21 NOTE — ED Notes (Signed)
Pt ambulatory to the restroom.

## 2015-03-21 NOTE — ED Notes (Signed)
Patient (s) was given copy of dc instructions and one paper script(s) and no electronic scripts.  Patient (s) has verbalized understanding of instructions and script (s).  Patient was given a current medication reconciliation form and verbalized understanding of their medications.   Patient (s) has verbalized understanding of the importance of discussing medications with  his or her physician or clinic they will be following up with.  Patient alert and oriented and in no acute distress.  Patient offered wheelchair from treatment area to hospital entrance, patient declined wheelchair.

## 2015-03-22 ENCOUNTER — Inpatient Hospital Stay
Admit: 2015-03-22 | Discharge: 2015-03-22 | Disposition: A | Discharge status: Home / Self Care | Payer: MEDICARE | Attending: Emergency Medicine

## 2015-03-22 LAB — URINALYSIS W/ REFLEX CULTURE
Bacteria: NEGATIVE /hpf
Bilirubin: NEGATIVE
Blood: NEGATIVE
Glucose: NEGATIVE mg/dL
Ketone: NEGATIVE mg/dL
Nitrites: NEGATIVE
Protein: NEGATIVE mg/dL
Specific gravity: 1.01 (ref 1.003–1.030)
Urobilinogen: 0.2 EU/dL (ref 0.2–1.0)
pH (UA): 5 (ref 5.0–8.0)

## 2015-03-22 LAB — CBC WITH AUTOMATED DIFF
ABS. BASOPHILS: 0 10*3/uL (ref 0.0–0.1)
ABS. EOSINOPHILS: 0.2 10*3/uL (ref 0.0–0.4)
ABS. LYMPHOCYTES: 1.3 10*3/uL (ref 0.8–3.5)
ABS. MONOCYTES: 0.3 10*3/uL (ref 0.0–1.0)
ABS. NEUTROPHILS: 4.2 10*3/uL (ref 1.8–8.0)
BASOPHILS: 0 % (ref 0–1)
EOSINOPHILS: 3 % (ref 0–7)
HCT: 37.8 % (ref 35.0–47.0)
HGB: 12.7 g/dL (ref 11.5–16.0)
LYMPHOCYTES: 22 % (ref 12–49)
MCH: 27.3 PG (ref 26.0–34.0)
MCHC: 33.6 g/dL (ref 30.0–36.5)
MCV: 81.1 FL (ref 80.0–99.0)
MONOCYTES: 5 % (ref 5–13)
NEUTROPHILS: 70 % (ref 32–75)
PLATELET: 192 10*3/uL (ref 150–400)
RBC: 4.66 M/uL (ref 3.80–5.20)
RDW: 14.6 % — ABNORMAL HIGH (ref 11.5–14.5)
WBC: 5.9 10*3/uL (ref 3.6–11.0)

## 2015-03-22 LAB — METABOLIC PANEL, BASIC
Anion gap: 10 mmol/L (ref 5–15)
BUN/Creatinine ratio: 11 — ABNORMAL LOW (ref 12–20)
BUN: 8 MG/DL (ref 6–20)
CO2: 27 mmol/L (ref 21–32)
Calcium: 8.9 MG/DL (ref 8.5–10.1)
Chloride: 100 mmol/L (ref 97–108)
Creatinine: 0.74 MG/DL (ref 0.55–1.02)
GFR est AA: >60 mL/min/{1.73_m2} (ref >60)
GFR est non-AA: >60 mL/min/{1.73_m2} (ref >60)
Glucose: 260 mg/dL — ABNORMAL HIGH (ref 65–100)
Potassium: 3.8 mmol/L (ref 3.5–5.1)
Sodium: 137 mmol/L (ref 136–145)

## 2015-03-22 LAB — HCG URINE, QL. - POC: Pregnancy test,urine (POC): NEGATIVE

## 2015-03-22 MED ORDER — CIPROFLOXACIN 500 MG TAB
500 mg | ORAL_TABLET | Freq: Two times a day (BID) | ORAL | 0 refills | Status: AC
Start: 2015-03-22 — End: 2015-03-28

## 2015-03-22 MED ORDER — CIPROFLOXACIN 500 MG TAB
500 mg | ORAL | Status: DC
Start: 2015-03-22 — End: 2015-03-22

## 2015-03-22 MED ORDER — SODIUM CHLORIDE 0.9% BOLUS IV
0.9 % | INTRAVENOUS | Status: AC
Start: 2015-03-22 — End: 2015-03-21
  Administered 2015-03-22: 03:00:00 via INTRAVENOUS

## 2015-03-22 MED ORDER — MORPHINE 2 MG/ML INJECTION
2 mg/mL | INTRAMUSCULAR | Status: AC
Start: 2015-03-22 — End: 2015-03-21
  Administered 2015-03-22: 04:00:00 via INTRAVENOUS

## 2015-03-22 MED FILL — SODIUM CHLORIDE 0.9 % IV: INTRAVENOUS | Qty: 1000

## 2015-03-22 MED FILL — MORPHINE 2 MG/ML INJECTION: 2 mg/mL | INTRAMUSCULAR | Qty: 1

## 2015-03-23 LAB — CULTURE, URINE
Colonies Counted: >100000
Colony Count: >100000

## 2015-04-08 ENCOUNTER — Emergency Department: Admit: 2015-04-08 | Payer: MEDICARE | Primary: Family Medicine

## 2015-04-08 ENCOUNTER — Inpatient Hospital Stay
Admit: 2015-04-08 | Discharge: 2015-04-09 | Disposition: A | Discharge status: Home / Self Care | Payer: MEDICARE | Attending: Emergency Medicine

## 2015-04-08 DIAGNOSIS — K439 Ventral hernia without obstruction or gangrene: Secondary | ICD-10-CM

## 2015-04-08 LAB — CBC WITH AUTOMATED DIFF
ABS. BASOPHILS: 0 10*3/uL (ref 0.0–0.1)
ABS. EOSINOPHILS: 0.2 10*3/uL (ref 0.0–0.4)
ABS. LYMPHOCYTES: 0.9 10*3/uL (ref 0.8–3.5)
ABS. MONOCYTES: 0.3 10*3/uL (ref 0.0–1.0)
ABS. NEUTROPHILS: 3.8 10*3/uL (ref 1.8–8.0)
BASOPHILS: 0 % (ref 0–1)
EOSINOPHILS: 3 % (ref 0–7)
HCT: 34.8 % — ABNORMAL LOW (ref 35.0–47.0)
HGB: 11.2 g/dL — ABNORMAL LOW (ref 11.5–16.0)
LYMPHOCYTES: 18 % (ref 12–49)
MCH: 26.4 PG (ref 26.0–34.0)
MCHC: 32.2 g/dL (ref 30.0–36.5)
MCV: 81.9 FL (ref 80.0–99.0)
MONOCYTES: 6 % (ref 5–13)
NEUTROPHILS: 73 % (ref 32–75)
PLATELET: 174 10*3/uL (ref 150–400)
RBC: 4.25 M/uL (ref 3.80–5.20)
RDW: 14.4 % (ref 11.5–14.5)
WBC: 5.2 10*3/uL (ref 3.6–11.0)

## 2015-04-08 LAB — METABOLIC PANEL, COMPREHENSIVE
A-G Ratio: 0.9 — ABNORMAL LOW (ref 1.1–2.2)
ALT (SGPT): 52 U/L (ref 12–78)
AST (SGOT): 68 U/L — ABNORMAL HIGH (ref 15–37)
Albumin: 3.5 g/dL (ref 3.5–5.0)
Alk. phosphatase: 121 U/L — ABNORMAL HIGH (ref 45–117)
Anion gap: 11 mmol/L (ref 5–15)
BUN/Creatinine ratio: 10 — ABNORMAL LOW (ref 12–20)
BUN: 7 MG/DL (ref 6–20)
Bilirubin, total: 0.3 MG/DL (ref 0.2–1.0)
CO2: 26 mmol/L (ref 21–32)
Calcium: 8.7 MG/DL (ref 8.5–10.1)
Chloride: 98 mmol/L (ref 97–108)
Creatinine: 0.69 MG/DL (ref 0.55–1.02)
GFR est AA: >60 mL/min/{1.73_m2} (ref >60)
GFR est non-AA: >60 mL/min/{1.73_m2} (ref >60)
Globulin: 4 g/dL (ref 2.0–4.0)
Glucose: 324 mg/dL — ABNORMAL HIGH (ref 65–100)
Potassium: 3.9 mmol/L (ref 3.5–5.1)
Protein, total: 7.5 g/dL (ref 6.4–8.2)
Sodium: 135 mmol/L — ABNORMAL LOW (ref 136–145)

## 2015-04-08 LAB — HCG URINE, QL. - POC: Pregnancy test,urine (POC): NEGATIVE

## 2015-04-08 LAB — LIPASE: Lipase: 180 U/L (ref 73–393)

## 2015-04-08 LAB — URINALYSIS W/ REFLEX CULTURE
Bilirubin: NEGATIVE
Blood: NEGATIVE
Glucose: >1000 mg/dL — AB
Ketone: NEGATIVE mg/dL
Nitrites: NEGATIVE
Protein: NEGATIVE mg/dL
Specific gravity: >1.03 — ABNORMAL HIGH (ref 1.003–1.030)
Urobilinogen: 0.2 EU/dL (ref 0.2–1.0)
pH (UA): 5.5 (ref 5.0–8.0)

## 2015-04-08 MED ORDER — SODIUM CHLORIDE 0.9 % IJ SYRG
Freq: Three times a day (TID) | INTRAMUSCULAR | Status: DC
Start: 2015-04-08 — End: 2015-04-11
  Administered 2015-04-09 – 2015-04-11 (×8): via INTRAVENOUS

## 2015-04-08 MED ORDER — SODIUM CHLORIDE 0.9 % IJ SYRG
Freq: Once | INTRAMUSCULAR | Status: AC
Start: 2015-04-08 — End: 2015-04-08
  Administered 2015-04-09: via INTRAVENOUS

## 2015-04-08 MED ORDER — SODIUM CHLORIDE 0.9 % IJ SYRG
INTRAMUSCULAR | Status: DC | PRN
Start: 2015-04-08 — End: 2015-04-11

## 2015-04-08 MED ORDER — MORPHINE 4 MG/ML SYRINGE
4 mg/mL | INTRAMUSCULAR | Status: AC
Start: 2015-04-08 — End: 2015-04-08
  Administered 2015-04-08: via INTRAVENOUS

## 2015-04-08 MED ORDER — IOPAMIDOL 76 % IV SOLN
370 mg iodine /mL (76 %) | Freq: Once | INTRAVENOUS | Status: AC
Start: 2015-04-08 — End: 2015-04-08
  Administered 2015-04-09: via INTRAVENOUS

## 2015-04-08 MED ORDER — SODIUM CHLORIDE 0.9% BOLUS IV
0.9 % | Freq: Once | INTRAVENOUS | Status: AC
Start: 2015-04-08 — End: 2015-04-08
  Administered 2015-04-08: via INTRAVENOUS

## 2015-04-08 MED ORDER — ONDANSETRON (PF) 4 MG/2 ML INJECTION
4 mg/2 mL | INTRAMUSCULAR | Status: AC
Start: 2015-04-08 — End: 2015-04-08
  Administered 2015-04-08: via INTRAVENOUS

## 2015-04-08 MED FILL — MORPHINE 4 MG/ML SYRINGE: 4 mg/mL | INTRAMUSCULAR | Qty: 1

## 2015-04-08 MED FILL — ONDANSETRON (PF) 4 MG/2 ML INJECTION: 4 mg/2 mL | INTRAMUSCULAR | Qty: 2

## 2015-04-08 MED FILL — SODIUM CHLORIDE 0.9 % IV: INTRAVENOUS | Qty: 250

## 2015-04-08 MED FILL — ISOVUE-370  76 % INTRAVENOUS SOLUTION: 370 mg iodine /mL (76 %) | INTRAVENOUS | Qty: 100

## 2015-04-08 MED FILL — BD POSIFLUSH NORMAL SALINE 0.9 % INJECTION SYRINGE: INTRAMUSCULAR | Qty: 10

## 2015-04-08 NOTE — ED Notes (Signed)
Pt reports that her pain has not improved, rates pain 7/10. Pt reports decrease in nausea

## 2015-04-08 NOTE — ED Notes (Signed)
Pt requesting refill of seroquel. Pt reports she takes 2-400mg  pills each night "for my schizophrenia" MD made aware of pt request.

## 2015-04-08 NOTE — ED Notes (Signed)
TRANSFER - OUT REPORT:    Verbal report given to [facility], RN(name) on Amanda Henderson  being transferred to Med Surg(unit) for routine progression of care       Report consisted of patient???s Situation, Background, Assessment and   Recommendations(SBAR).     Information from the following report(s) SBAR and ED Summary was reviewed with the receiving nurse.    Lines:   Peripheral IV 04/08/15 Left Antecubital (Active)   Site Assessment Clean, dry, & intact 04/08/2015  7:24 PM   Phlebitis Assessment 0 04/08/2015  7:24 PM   Infiltration Assessment 0 04/08/2015  7:24 PM   Dressing Status Clean, dry, & intact 04/08/2015  7:24 PM   Dressing Type Transparent 04/08/2015  7:24 PM   Hub Color/Line Status Pink;Flushed;Patent 04/08/2015  7:24 PM   Action Taken Blood drawn 04/08/2015  7:24 PM        Opportunity for questions and clarification was provided.

## 2015-04-08 NOTE — ED Notes (Signed)
Bedside and Verbal shift change report given to Idalia NeedleAmanda N Taylor, RN (oncoming nurse) by Madilyn FiremanHayes, RN (offgoing nurse). Report included the following information SBAR, ED Summary and Intake/Output.     During shift change, pt reports not taking medications because she has been sick and states "It hurts to give myself shots so I don't do it."  Pt reports hx of non compliance with medications in the past. Pt also changes how often/ how long she has been vomiting. Pt requesting pain medication. Pt reports someone will be able to pick her up, there is no one at the bedside at this time

## 2015-04-08 NOTE — ED Provider Notes (Signed)
HPI Comments: Amanda Henderson is a 30 y.o. female with PMHx significant for HTN,DM,PVD,rhabdomyosarcoma and PShx significant for cholecystectomy, hernia repair, cyst drainage who presents ambulatory to the ED with cc of abdominal pain with associated distension (R>L) rated 8/10 x 2-3 days. She also c/o nausea and vomiting. She notes a h/o rhabdomyosarcoma and states that she had some cancer over her abdominal wall, reporting that she is in remission after Chemo therapy.  Pt reports that she is originally from [place] but that her oncologist was in [place], and that she recently moved to [place] to live with her parents.Chart shows that pt was seen in February for similar c/o abdominal pain and nausea, and a CT without contrast was performed (see progress note). She denies chance of pregnancy stating that her last period was last month. She specifically denies any fevers, chills,dysuria/hematuria  chest pain, shortness of breath, headache, rash, diarrhea, sweating or weight loss.      PCP: None    Social hx: smoking (-) EtOH (-)    There are no other complaints, changes, or physical findings at this time.       The history is provided by the patient.        Past Medical History:   Diagnosis Date   ??? Cancer ([place])    ??? Depression    ??? Diabetes ([place])    ??? Hypertension    ??? Psychiatric disorder     Anxiety and Schizoaffective disorder       Past Surgical History:   Procedure Laterality Date   ??? HX CHOLECYSTECTOMY           No family history on file.    Social History     Social History   ??? Marital status: SINGLE     Spouse name: N/A   ??? Number of children: N/A   ??? Years of education: N/A     Occupational History   ??? Not on file.     Social History Main Topics   ??? Smoking status: Never Smoker   ??? Smokeless tobacco: Not on file   ??? Alcohol use No   ??? Drug use: No   ??? Sexual activity: Not on file     Other Topics Concern   ??? Not on file     Social History Narrative   ??? No narrative on file          ALLERGIES: Geodon [ziprasidone hcl]; Haldol [haloperidol lactate]; Ibuprofen; and Toradol [ketorolac]    Review of Systems   Constitutional: Negative.  Negative for activity change, appetite change, chills, fatigue, fever and unexpected weight change.   HENT: Negative.  Negative for congestion, hearing loss, rhinorrhea, sneezing and voice change.    Eyes: Negative.  Negative for pain and visual disturbance.   Respiratory: Negative.  Negative for apnea, cough, choking, chest tightness and shortness of breath.    Cardiovascular: Negative.  Negative for chest pain and palpitations.   Gastrointestinal: Positive for abdominal pain, nausea and vomiting. Negative for abdominal distention, blood in stool and diarrhea.   Genitourinary: Negative.  Negative for difficulty urinating, flank pain, frequency and urgency.        No discharge   Musculoskeletal: Negative.  Negative for arthralgias, back pain, myalgias and neck stiffness.   Skin: Negative.  Negative for color change and rash.   Neurological: Negative.  Negative for dizziness, seizures, syncope, speech difficulty, weakness, numbness and headaches.   Hematological: Negative for adenopathy.   Psychiatric/Behavioral: Negative.  Negative  for agitation, behavioral problems, dysphoric mood and suicidal ideas. The patient is not nervous/anxious.        Vitals:    04/08/15 1751 04/08/15 1949 04/08/15 1950   BP: (!) 137/105 148/86    Pulse: (!) 112     Resp: 18     Temp: 98.4 ??F (36.9 ??C)     SpO2: 97%  92%   Weight: 113.4 kg (250 lb)     Height: 5' 4"  (1.626 m)              Physical Exam   Physical Examination: General appearance - WDWN, in no apparent distress  Head - NC/AT  Eyes - pupils equal, round  and reactive, extraocular eye movements intact, conj/sclera clear, anicteric  Mouth - mucous membranes moist, pharynx normal without lesions  Nose/Ears - nares clear, Tms & canals clear  Neck - supple, no significant adenopathy, trachea midline, no crepitus, c  spine diffusely non-tender, no step offs  Chest - Normal respiratory effort, clear to auscultation bilaterally, no wheezes/rales/rhonchi  Heart - normal rate and regular rhythm, S1 and S2 normal, no murmurs, gallops, or rubs  Abdomen - soft, Tenderness and distension (R>L), nabs, no masses, guarding, rebound or rigidity  Neurological - alert, oriented, normal speech, cranial nerves intact, no focal motor findings, motor & sensory diffusely intact, normal gait  Extremities/MS - peripheral pulses normal, no pedal edema, all joints atraumatic, FROM, non-tender, no gross deformities, spine diffusely non-tender  Skin - normal coloration and turgor, no rashes, no lesions or lacerations        MDM  Number of Diagnoses or Management Options  Diagnosis management comments: DDx: obstruction, hernia, UTI, cancer       Amount and/or Complexity of Data Reviewed  Clinical lab tests: ordered and reviewed  Tests in the radiology section of CPT??: ordered and reviewed  Review and summarize past medical records: yes    Patient Progress  Patient progress: stable    ED Course       Procedures    Progress Note:  7:07 PM  Reviewed pt's CT scan from 03/21/15 that was performed without contrast. That result showed a large ventral hernia. Imaging may not have properly evaluated pt due to her large size. There is concern for obstruction. Will repeat CT with contrast.   Written by Fanny Dance, ED Scribe as dictated by Theora Gianotti. Burt Ek, MD    CONSULT NOTE:   8:30 PM  Jessalynn Mccowan S. Burt Ek, MD spoke with Dr.Cohen,   Specialty: general surgery  Discussed pt's hx, disposition, and available diagnostic and imaging results. Reviewed care plans. Consultant agrees with plans as outlined.  States that pt can be admitted here.   Written by Fanny Dance, ED Scribe, as dictated by Theora Gianotti. Burt Ek, MD.      CONSULT NOTE:   8:42 PM  Jennilee Demarco S. Burt Ek, MD spoke with Dr.Lapetina,   Specialty: Hospitalist   Discussed pt's hx, disposition, and available diagnostic and imaging results. Reviewed care plans. Consultant will evaluate pt for admission.  Written by Fanny Dance, ED Scribe, as dictated by Theora Gianotti. Burt Ek, MD.    LABORATORY TESTS:  Recent Results (from the past 12 hour(s))   HCG URINE, QL. - POC    Collection Time: 04/08/15  6:55 PM   Result Value Ref Range    Pregnancy test,urine (POC) NEGATIVE  NEG     URINALYSIS W/ REFLEX CULTURE    Collection Time: 04/08/15  6:59 PM   Result Value  Ref Range    Color YELLOW/STRAW      Appearance CLOUDY (A) CLEAR      Specific gravity >1.030 (H) 1.003 - 1.030    pH (UA) 5.5 5.0 - 8.0      Protein NEGATIVE  NEG mg/dL    Glucose >1000 (A) NEG mg/dL    Ketone NEGATIVE  NEG mg/dL    Bilirubin NEGATIVE  NEG      Blood NEGATIVE  NEG      Urobilinogen 0.2 0.2 - 1.0 EU/dL    Nitrites NEGATIVE  NEG      Leukocyte Esterase TRACE (A) NEG      WBC 0-4 0 - 4 /hpf    RBC 0-5 0 - 5 /hpf    Epithelial cells FEW FEW /lpf    Bacteria 1+ (A) NEG /hpf    UA:UC IF INDICATED URINE CULTURE ORDERED (A) CNI     CBC WITH AUTOMATED DIFF    Collection Time: 04/08/15  7:14 PM   Result Value Ref Range    WBC 5.2 3.6 - 11.0 K/uL    RBC 4.25 3.80 - 5.20 M/uL    HGB 11.2 (L) 11.5 - 16.0 g/dL    HCT 34.8 (L) 35.0 - 47.0 %    MCV 81.9 80.0 - 99.0 FL    MCH 26.4 26.0 - 34.0 PG    MCHC 32.2 30.0 - 36.5 g/dL    RDW 14.4 11.5 - 14.5 %    PLATELET 174 150 - 400 K/uL    NEUTROPHILS 73 32 - 75 %    LYMPHOCYTES 18 12 - 49 %    MONOCYTES 6 5 - 13 %    EOSINOPHILS 3 0 - 7 %    BASOPHILS 0 0 - 1 %    ABS. NEUTROPHILS 3.8 1.8 - 8.0 K/UL    ABS. LYMPHOCYTES 0.9 0.8 - 3.5 K/UL    ABS. MONOCYTES 0.3 0.0 - 1.0 K/UL    ABS. EOSINOPHILS 0.2 0.0 - 0.4 K/UL    ABS. BASOPHILS 0.0 0.0 - 0.1 K/UL   METABOLIC PANEL, COMPREHENSIVE    Collection Time: 04/08/15  7:14 PM   Result Value Ref Range    Sodium 135 (L) 136 - 145 mmol/L    Potassium 3.9 3.5 - 5.1 mmol/L    Chloride 98 97 - 108 mmol/L    CO2 26 21 - 32 mmol/L     Anion gap 11 5 - 15 mmol/L    Glucose 324 (H) 65 - 100 mg/dL    BUN 7 6 - 20 MG/DL    Creatinine 0.69 0.55 - 1.02 MG/DL    BUN/Creatinine ratio 10 (L) 12 - 20      GFR est AA >60 >60 ml/min/1.48m    GFR est non-AA >60 >60 ml/min/1.77m   Calcium 8.7 8.5 - 10.1 MG/DL    Bilirubin, total 0.3 0.2 - 1.0 MG/DL    ALT (SGPT) 52 12 - 78 U/L    AST (SGOT) 68 (H) 15 - 37 U/L    Alk. phosphatase 121 (H) 45 - 117 U/L    Protein, total 7.5 6.4 - 8.2 g/dL    Albumin 3.5 3.5 - 5.0 g/dL    Globulin 4.0 2.0 - 4.0 g/dL    A-G Ratio 0.9 (L) 1.1 - 2.2     LIPASE    Collection Time: 04/08/15  7:14 PM   Result Value Ref Range    Lipase 180 73 - 393 U/L  LACTIC ACID, PLASMA    Collection Time: 04/08/15  7:14 PM   Result Value Ref Range    Lactic acid 2.1 (HH) 0.4 - 2.0 MMOL/L       IMAGING RESULTS:  CT ABD PELV W CONT   Final ResultINDICATION: RUQ, mid abd pain, hx stomach cancer, multiple surgeries  ??  COMPARISON: CT 03/21/2015.  ??  TECHNIQUE:   Following the uneventful intravenous administration of 100 cc Isovue-370, thin  axial images were obtained through the abdomen and pelvis. Coronal and sagittal  reconstructions were generated. Oral contrast was not administered. CT dose  reduction was achieved through use of a standardized protocol tailored for this  examination and automatic exposure control for dose modulation.  ??  FINDINGS:   LUNG BASES: Clear.  INCIDENTALLY IMAGED HEART AND MEDIASTINUM: Unremarkable.  LIVER: Enlarged and diffusely low density with no focal lesion.  GALLBLADDER: Surgically absent.  SPLEEN: Enlarged. No focal lesion.  PANCREAS: No mass or ductal dilatation.  ADRENALS: Unremarkable.  KIDNEYS: No mass, calculus, or hydronephrosis.  STOMACH: Unremarkable.  SMALL BOWEL: No dilatation or wall thickening.  COLON: No dilatation or wall thickening.  APPENDIX: Unremarkable.  PERITONEUM: No ascites or pneumoperitoneum.  RETROPERITONEUM: No lymphadenopathy or aortic aneurysm.   REPRODUCTIVE ORGANS: Uterus and ovaries appear normal.  URINARY BLADDER: No mass or calculus.  BONES: No destructive bone lesion.  ADDITIONAL COMMENTS: Fat-containing infraumbilical midline anterior abdominal  wall hernia through 2.6 cm fascial defect with some slight anteriorly adjacent  stranding and hernia sac measurements of approximately 3.5 x 6.1 cm.  ??  IMPRESSION  IMPRESSION:  ??  1. No acute findings to correlate with pain, though there is a anterior  abdominal wall fat-containing hernia with slight anteriorly adjacent stranding  which could potentially be a source for pain.  2. Hepatosplenomegaly with hepatic steatosis.  3. Additional incidental findings as above.          MEDICATIONS GIVEN:  Medications   sodium chloride (NS) flush 5-10 mL (not administered)   sodium chloride (NS) flush 5-10 mL (not administered)   sodium chloride 0.9 % bolus infusion 500 mL (not administered)   morphine injection 4 mg (4 mg IntraVENous Given 04/08/15 1936)   ondansetron (ZOFRAN) injection 4 mg (4 mg IntraVENous Given 04/08/15 1936)   sodium chloride 0.9 % bolus infusion 250 mL (0 mL IntraVENous IV Completed 04/08/15 2036)   iopamidol (ISOVUE-370) 76 % injection 100 mL (100 mL IntraVENous Given 04/08/15 2006)   sodium chloride (NS) flush 5-10 mL (10 mL IntraVENous Given 04/08/15 2006)       IMPRESSION:  No diagnosis found.    PLAN:  1. 8:43 PM  Patient is being admitted to the hospital by Dr. Floy Sabina.  The results of their tests and reasons for their admission have been discussed with them and/or available family.  They convey agreement and understanding for the need to be admitted and for their admission diagnosis.  Consultation has been made with the inpatient physician specialist for hospitalization.  Written by Fanny Dance, ED Scribe, as dictated by Theora Gianotti. Burt Ek, MD.      This note is prepared by Fanny Dance, acting as Scribe for [employer]. Burt Ek, [place]. Burt Ek, MD: The scribe's documentation has been prepared under my direction and personally reviewed by me in its entirety. I confirm that the note above accurately reflects all work, treatment, procedures, and medical decision making performed by me.

## 2015-04-08 NOTE — Progress Notes (Signed)
Pharmacy Medication Reconciliation     Recommendations/Findings:   All the meds below have been ordered for this hospital admission except the following:    Novolog sliding scale  Gabapentin 600 mg po TID  Levemir 15 units subq bedtime          -Clarified PTA med list with [facility] and patient interview.    PTA medication list was corrected to the following:     Prior to Admission Medications   Prescriptions Last Dose Informant Patient Reported? Taking?   QUEtiapine SR (SEROQUEL XR) 400 mg sr tablet 04/08/2015 at 200  Yes Yes   Sig: Take 800 mg by mouth nightly.   busPIRone (BUSPAR) 15 mg tablet 04/08/2015 at 200  Yes Yes   Sig: Take 15 mg by mouth three (3) times daily.   chlorproMAZINE (THORAZINE) 10 mg tablet 04/08/2015 at 200  Yes Yes   Sig: Take 25 mg by mouth three (3) times daily.   cloNIDine HCl (CATAPRES) 0.1 mg tablet 04/07/2015 at 1200  Yes Yes   Sig: Take 0.1 mg by mouth two (2) times a day.   fenofibrate nanocrystallized (TRICOR) 145 mg tablet 04/07/2015 at 1000  Yes Yes   Sig: Take 145 mg by mouth daily.   gabapentin (NEURONTIN) 600 mg tablet   Yes Yes   Sig: Take 600 mg by mouth three (3) times daily.   insulin aspart (NOVOLOG) 100 unit/mL injection 04/07/2015  Yes Yes   Sig: by SubCUTAneous route. SLIDING SCALE   insulin detemir (LEVEMIR FLEXPEN) 100 unit/mL (3 mL) inpn 04/01/2015 at Unknown time  Yes Yes   Sig: 15 Units by SubCUTAneous route nightly.   lisinopril-hydroCHLOROthiazide (PRINZIDE, ZESTORETIC) 20-12.5 mg per tablet 04/07/2015 at 1000  Yes Yes   Sig: Take 1 Tab by mouth daily.   pantoprazole (PROTONIX) 40 mg tablet 04/07/2015 at 1000  Yes Yes   Sig: Take 40 mg by mouth daily.   sertraline (ZOLOFT) 100 mg tablet 04/08/2015 at 200  Yes Yes   Sig: Take 100 mg by mouth two (2) times a day.   traZODone (DESYREL) 50 mg tablet 04/08/2015 at 200  Yes Yes   Sig: Take 50 mg by mouth nightly.      Facility-Administered Medications: None          Thank you,  Jonny RuizJerome J Fanizza, [facility] - BentonvilleRPH

## 2015-04-08 NOTE — H&P (Signed)
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  She states she has had a fever of "99" and has had intermittently for a while but worse x 2 days.  She denies constipation, diarrhea, urinary symptoms.  ED discussed this case with General surgery after CT showed a ventral hernia with fat stranding but no entrapped bowel, only fat.  Surgery recommended observation and did not think this hernia would require acute surgery.  She states she has been fairly healthy since cancer remission but does have diabetes and HTN.    Past Medical History:   Diagnosis Date   ??? Cancer ([place])    ??? Depression    ??? Diabetes ([place])    ??? Hypertension    ??? Psychiatric disorder     Anxiety and Schizoaffective disorder      Past Surgical History:   Procedure Laterality Date   ??? HX CHOLECYSTECTOMY        Prior to Admission medications    Medication Sig Start Date End Date Taking? Authorizing Provider   gabapentin (NEURONTIN) 600 mg tablet Take 600 mg by mouth three (3) times daily.   Yes Historical Provider   insulin detemir (LEVEMIR FLEXPEN) 100 unit/mL (3 mL) inpn 15 Units by SubCUTAneous route nightly.   Yes Phys Other, MD   cloNIDine HCl (CATAPRES) 0.1 mg tablet Take 0.1 mg by mouth two (2) times a day.   Yes Phys Other, MD   traZODone (DESYREL) 50 mg tablet Take 50 mg by mouth nightly.   Yes Phys Other, MD   busPIRone (BUSPAR) 15 mg tablet Take 15 mg by mouth three (3) times daily.   Yes Phys Other, MD   sertraline (ZOLOFT) 100 mg tablet Take 100 mg by mouth two (2) times a day.   Yes Phys Other, MD   fenofibrate nanocrystallized (TRICOR) 145 mg tablet Take 145 mg by mouth daily.   Yes Phys Other, MD   QUEtiapine SR (SEROQUEL XR) 400 mg sr tablet Take 800 mg by mouth nightly.   Yes Phys Other, MD   chlorproMAZINE (THORAZINE) 10 mg tablet Take 25 mg by mouth three (3)  times daily.   Yes Phys Other, MD   pantoprazole (PROTONIX) 40 mg tablet Take 40 mg by mouth daily.   Yes Phys Other, MD   lisinopril-hydroCHLOROthiazide (PRINZIDE, ZESTORETIC) 20-12.5 mg per tablet Take 1 Tab by mouth daily.   Yes Phys Other, MD   insulin aspart (NOVOLOG) 100 unit/mL injection by SubCUTAneous route. SLIDING SCALE   Yes Phys Other, MD     Allergies   Allergen Reactions   ??? Geodon [Ziprasidone Hcl] Other (comments)     " mouth lock up"   ??? Haldol [Haloperidol Lactate] Other (comments)     " mouth lock up".    ??? Ibuprofen Swelling     Throat swelling   ??? Toradol [Ketorolac] Swelling     Throat Swelling      Social History   Substance Use Topics   ??? Smoking status: Never Smoker   ??? Smokeless tobacco: Not on file   ??? Alcohol use No      History reviewed. No pertinent family history.   Review of Systems   Constitutional: Negative.    HENT: Negative.    Eyes: Negative.    Respiratory: Negative.    Cardiovascular:  Negative.    Gastrointestinal: Positive for abdominal pain, nausea and vomiting. Negative for abdominal distention, anal bleeding, blood in stool, constipation, diarrhea and rectal pain.   Endocrine: Negative.    Genitourinary: Negative.    Musculoskeletal: Negative.    Allergic/Immunologic: Negative.    Neurological: Negative.    Hematological: Negative.    All other systems reviewed and are negative.      Objective:          Patient Vitals for the past 8 hrs:   BP Temp Pulse Resp SpO2 Height Weight   04/08/15 2135 152/81 98.1 ??F (36.7 ??C) 92 17 96 % - 119 kg (262 lb 6.4 oz)   04/08/15 2100 139/74 - - - 97 % - -   04/08/15 1950 - - - - 92 % - -   04/08/15 1949 148/86 - - - - - -   04/08/15 1751 (!) 137/105 98.4 ??F (36.9 ??C) (!) 112 18 97 % 5' 4"  (1.626 m) 113.4 kg (250 lb)     Physical Exam   Nursing note and vitals reviewed.  Constitutional: She is oriented to person, place, and time. She appears well-developed.   obese   HENT:   Head: Normocephalic and atraumatic.    Eyes: Pupils are equal, round, and reactive to light.   Neck: Normal range of motion.   Cardiovascular: Normal rate and regular rhythm.    Pulmonary/Chest: Effort normal and breath sounds normal. No respiratory distress. She has no wheezes.   Abdominal: Soft. Bowel sounds are normal. She exhibits no distension. There is no tenderness. There is no rebound.   Musculoskeletal: Normal range of motion.   Neurological: She is alert and oriented to person, place, and time. No cranial nerve deficit. She exhibits normal muscle tone. Coordination normal.   Skin: Skin is warm and dry.   Psychiatric: She has a normal mood and affect.        Labs:    Recent Results (from the past 24 hour(s))   HCG URINE, QL. - POC    Collection Time: 04/08/15  6:55 PM   Result Value Ref Range    Pregnancy test,urine (POC) NEGATIVE  NEG     URINALYSIS W/ REFLEX CULTURE    Collection Time: 04/08/15  6:59 PM   Result Value Ref Range    Color YELLOW/STRAW      Appearance CLOUDY (A) CLEAR      Specific gravity >1.030 (H) 1.003 - 1.030    pH (UA) 5.5 5.0 - 8.0      Protein NEGATIVE  NEG mg/dL    Glucose >1000 (A) NEG mg/dL    Ketone NEGATIVE  NEG mg/dL    Bilirubin NEGATIVE  NEG      Blood NEGATIVE  NEG      Urobilinogen 0.2 0.2 - 1.0 EU/dL    Nitrites NEGATIVE  NEG      Leukocyte Esterase TRACE (A) NEG      WBC 0-4 0 - 4 /hpf    RBC 0-5 0 - 5 /hpf    Epithelial cells FEW FEW /lpf    Bacteria 1+ (A) NEG /hpf    UA:UC IF INDICATED URINE CULTURE ORDERED (A) CNI     CBC WITH AUTOMATED DIFF    Collection Time: 04/08/15  7:14 PM   Result Value Ref Range    WBC 5.2 3.6 - 11.0 K/uL    RBC 4.25 3.80 - 5.20 M/uL    HGB 11.2 (L) 11.5 - 16.0 g/dL  HCT 34.8 (L) 35.0 - 47.0 %    MCV 81.9 80.0 - 99.0 FL    MCH 26.4 26.0 - 34.0 PG    MCHC 32.2 30.0 - 36.5 g/dL    RDW 14.4 11.5 - 14.5 %    PLATELET 174 150 - 400 K/uL    NEUTROPHILS 73 32 - 75 %    LYMPHOCYTES 18 12 - 49 %    MONOCYTES 6 5 - 13 %    EOSINOPHILS 3 0 - 7 %    BASOPHILS 0 0 - 1 %     ABS. NEUTROPHILS 3.8 1.8 - 8.0 K/UL    ABS. LYMPHOCYTES 0.9 0.8 - 3.5 K/UL    ABS. MONOCYTES 0.3 0.0 - 1.0 K/UL    ABS. EOSINOPHILS 0.2 0.0 - 0.4 K/UL    ABS. BASOPHILS 0.0 0.0 - 0.1 K/UL   METABOLIC PANEL, COMPREHENSIVE    Collection Time: 04/08/15  7:14 PM   Result Value Ref Range    Sodium 135 (L) 136 - 145 mmol/L    Potassium 3.9 3.5 - 5.1 mmol/L    Chloride 98 97 - 108 mmol/L    CO2 26 21 - 32 mmol/L    Anion gap 11 5 - 15 mmol/L    Glucose 324 (H) 65 - 100 mg/dL    BUN 7 6 - 20 MG/DL    Creatinine 0.69 0.55 - 1.02 MG/DL    BUN/Creatinine ratio 10 (L) 12 - 20      GFR est AA >60 >60 ml/min/1.26m    GFR est non-AA >60 >60 ml/min/1.718m   Calcium 8.7 8.5 - 10.1 MG/DL    Bilirubin, total 0.3 0.2 - 1.0 MG/DL    ALT (SGPT) 52 12 - 78 U/L    AST (SGOT) 68 (H) 15 - 37 U/L    Alk. phosphatase 121 (H) 45 - 117 U/L    Protein, total 7.5 6.4 - 8.2 g/dL    Albumin 3.5 3.5 - 5.0 g/dL    Globulin 4.0 2.0 - 4.0 g/dL    A-G Ratio 0.9 (L) 1.1 - 2.2     LIPASE    Collection Time: 04/08/15  7:14 PM   Result Value Ref Range    Lipase 180 73 - 393 U/L   LACTIC ACID, PLASMA    Collection Time: 04/08/15  7:14 PM   Result Value Ref Range    Lactic acid 2.1 (HH) 0.4 - 2.0 MMOL/L   GLUCOSE, POC    Collection Time: 04/08/15 10:02 PM   Result Value Ref Range    Glucose (POC) 279 (H) 65 - 100 mg/dL    Performed by LeManson Passey    INDICATION: RUQ, mid abd pain, hx stomach cancer, multiple surgeries  ??  COMPARISON: CT 03/21/2015.  ??  TECHNIQUE:   Following the uneventful intravenous administration of 100 cc Isovue-370, thin  axial images were obtained through the abdomen and pelvis. Coronal and sagittal  reconstructions were generated. Oral contrast was not administered. CT dose  reduction was achieved through use of a standardized protocol tailored for this  examination and automatic exposure control for dose modulation.  ??  FINDINGS:   LUNG BASES: Clear.  INCIDENTALLY IMAGED HEART AND MEDIASTINUM: Unremarkable.   LIVER: Enlarged and diffusely low density with no focal lesion.  GALLBLADDER: Surgically absent.  SPLEEN: Enlarged. No focal lesion.  PANCREAS: No mass or ductal dilatation.  ADRENALS: Unremarkable.  KIDNEYS: No mass, calculus, or hydronephrosis.  STOMACH: Unremarkable.  SMALL BOWEL: No dilatation or wall thickening.  COLON: No dilatation or wall thickening.  APPENDIX: Unremarkable.  PERITONEUM: No ascites or pneumoperitoneum.  RETROPERITONEUM: No lymphadenopathy or aortic aneurysm.  REPRODUCTIVE ORGANS: Uterus and ovaries appear normal.  URINARY BLADDER: No mass or calculus.  BONES: No destructive bone lesion.  ADDITIONAL COMMENTS: Fat-containing infraumbilical midline anterior abdominal  wall hernia through 2.6 cm fascial defect with some slight anteriorly adjacent  stranding and hernia sac measurements of approximately 3.5 x 6.1 cm.  ??  IMPRESSION  IMPRESSION:  ??  1. No acute findings to correlate with pain, though there is a anterior  abdominal wall fat-containing hernia with slight anteriorly adjacent stranding  which could potentially be a source for pain.  2. Hepatosplenomegaly with hepatic steatosis.  3. Additional incidental findings as above.      Assessment:  Principal Problem:    Ventral hernia without obstruction or gangrene (04/08/2015)    Active Problems:    Generalized abdominal pain (04/08/2015)      Type 2 diabetes mellitus without complication ([place]) (4/74/2595)        Plan:    Signed:  Dub Mikes, MD 04/08/2015

## 2015-04-09 LAB — CBC WITH AUTOMATED DIFF
ABS. BASOPHILS: 0 10*3/uL (ref 0.0–0.1)
ABS. EOSINOPHILS: 0.2 10*3/uL (ref 0.0–0.4)
ABS. LYMPHOCYTES: 1.2 10*3/uL (ref 0.8–3.5)
ABS. MONOCYTES: 0.3 10*3/uL (ref 0.0–1.0)
ABS. NEUTROPHILS: 2.7 10*3/uL (ref 1.8–8.0)
BASOPHILS: 0 % (ref 0–1)
EOSINOPHILS: 4 % (ref 0–7)
HCT: 34.2 % — ABNORMAL LOW (ref 35.0–47.0)
HGB: 10.5 g/dL — ABNORMAL LOW (ref 11.5–16.0)
LYMPHOCYTES: 28 % (ref 12–49)
MCH: 25.7 PG — ABNORMAL LOW (ref 26.0–34.0)
MCHC: 30.7 g/dL (ref 30.0–36.5)
MCV: 83.8 FL (ref 80.0–99.0)
MONOCYTES: 7 % (ref 5–13)
NEUTROPHILS: 61 % (ref 32–75)
PLATELET: 159 10*3/uL (ref 150–400)
RBC: 4.08 M/uL (ref 3.80–5.20)
RDW: 14.8 % — ABNORMAL HIGH (ref 11.5–14.5)
WBC: 4.3 10*3/uL (ref 3.6–11.0)

## 2015-04-09 LAB — METABOLIC PANEL, BASIC
Anion gap: 9 mmol/L (ref 5–15)
BUN/Creatinine ratio: 11 — ABNORMAL LOW (ref 12–20)
BUN: 6 MG/DL (ref 6–20)
CO2: 26 mmol/L (ref 21–32)
Calcium: 8.2 MG/DL — ABNORMAL LOW (ref 8.5–10.1)
Chloride: 102 mmol/L (ref 97–108)
Creatinine: 0.53 MG/DL — ABNORMAL LOW (ref 0.55–1.02)
GFR est AA: >60 mL/min/{1.73_m2} (ref >60)
GFR est non-AA: >60 mL/min/{1.73_m2} (ref >60)
Glucose: 204 mg/dL — ABNORMAL HIGH (ref 65–100)
Potassium: 3.9 mmol/L (ref 3.5–5.1)
Sodium: 137 mmol/L (ref 136–145)

## 2015-04-09 LAB — GLUCOSE, POC
Glucose (POC): 279 mg/dL — ABNORMAL HIGH (ref 65–100)
Glucose (POC): 216 mg/dL — ABNORMAL HIGH (ref 65–100)
Glucose (POC): 243 mg/dL — ABNORMAL HIGH (ref 65–100)
Glucose (POC): 159 mg/dL — ABNORMAL HIGH (ref 65–100)

## 2015-04-09 LAB — PHOSPHORUS: Phosphorus: 4.7 MG/DL (ref 2.6–4.7)

## 2015-04-09 LAB — TYPE & SCREEN
ABO/Rh(D): B POS
Antibody screen: NEGATIVE

## 2015-04-09 LAB — HEMOGLOBIN A1C WITH EAG
Est. average glucose: 260 mg/dL
Hemoglobin A1c: 10.7 % — ABNORMAL HIGH (ref 4.2–6.3)

## 2015-04-09 LAB — LACTIC ACID
Lactic acid: 1.7 MMOL/L (ref 0.4–2.0)
Lactic acid: 2.1 MMOL/L — CR (ref 0.4–2.0)

## 2015-04-09 LAB — MAGNESIUM: Magnesium: 1.6 mg/dL (ref 1.6–2.4)

## 2015-04-09 LAB — TYPE AND SCREEN
ABO/Rh: B POS
Antibody Screen: NEGATIVE

## 2015-04-09 MED ORDER — SODIUM CHLORIDE 0.9% BOLUS IV
0.9 % | Freq: Once | INTRAVENOUS | Status: AC
Start: 2015-04-09 — End: 2015-04-08
  Administered 2015-04-09: 01:00:00 via INTRAVENOUS

## 2015-04-09 MED ORDER — INSULIN GLARGINE 100 UNIT/ML INJECTION
100 unit/mL | Freq: Every evening | SUBCUTANEOUS | Status: DC
Start: 2015-04-09 — End: 2015-04-09
  Administered 2015-04-09: 03:00:00 via SUBCUTANEOUS

## 2015-04-09 MED ORDER — MORPHINE 2 MG/ML INJECTION
2 mg/mL | INTRAMUSCULAR | Status: DC | PRN
Start: 2015-04-09 — End: 2015-04-08
  Administered 2015-04-09: 02:00:00 via INTRAVENOUS

## 2015-04-09 MED ORDER — DEXTROSE 50% IN WATER (D50W) IV SYRG
INTRAVENOUS | Status: DC | PRN
Start: 2015-04-09 — End: 2015-04-11

## 2015-04-09 MED ORDER — LISINOPRIL 20 MG TAB
20 mg | Freq: Every day | ORAL | Status: DC
Start: 2015-04-09 — End: 2015-04-11
  Administered 2015-04-09 – 2015-04-11 (×3): via ORAL

## 2015-04-09 MED ORDER — PROPOFOL 10 MG/ML IV EMUL
10 mg/mL | INTRAVENOUS | Status: DC | PRN
Start: 2015-04-09 — End: 2015-04-09
  Administered 2015-04-09 (×6): via INTRAVENOUS

## 2015-04-09 MED ORDER — HYDROCHLOROTHIAZIDE 25 MG TAB
25 mg | Freq: Every day | ORAL | Status: DC
Start: 2015-04-09 — End: 2015-04-11
  Administered 2015-04-09 – 2015-04-11 (×3): via ORAL

## 2015-04-09 MED ORDER — HEPARIN (PORCINE) 5,000 UNIT/ML IJ SOLN
5000 unit/mL | Freq: Three times a day (TID) | INTRAMUSCULAR | Status: DC
Start: 2015-04-09 — End: 2015-04-11
  Administered 2015-04-09 – 2015-04-11 (×7): via SUBCUTANEOUS

## 2015-04-09 MED ORDER — .PHARMACY TO SUBSTITUTE PER PROTOCOL
Status: DC | PRN
Start: 2015-04-09 — End: 2015-04-08

## 2015-04-09 MED ORDER — PROPOFOL 10 MG/ML IV EMUL
10 mg/mL | INTRAVENOUS | Status: AC
Start: 2015-04-09 — End: ?

## 2015-04-09 MED ORDER — LIDOCAINE (PF) 20 MG/ML (2 %) IJ SOLN
20 mg/mL (2 %) | INTRAMUSCULAR | Status: AC
Start: 2015-04-09 — End: ?

## 2015-04-09 MED ORDER — SODIUM CHLORIDE 0.9 % IJ SYRG
Freq: Three times a day (TID) | INTRAMUSCULAR | Status: DC
Start: 2015-04-09 — End: 2015-04-11
  Administered 2015-04-09 – 2015-04-11 (×9): via INTRAVENOUS

## 2015-04-09 MED ORDER — INSULIN GLARGINE 100 UNIT/ML INJECTION
100 unit/mL | SUBCUTANEOUS | Status: AC
Start: 2015-04-09 — End: 2015-04-09
  Administered 2015-04-09: 21:00:00 via SUBCUTANEOUS

## 2015-04-09 MED ORDER — SODIUM CHLORIDE 0.9 % IJ SYRG
INTRAMUSCULAR | Status: DC | PRN
Start: 2015-04-09 — End: 2015-04-11
  Administered 2015-04-09: 04:00:00 via INTRAVENOUS

## 2015-04-09 MED ORDER — SERTRALINE 50 MG TAB
50 mg | Freq: Two times a day (BID) | ORAL | Status: DC
Start: 2015-04-09 — End: 2015-04-11
  Administered 2015-04-09 – 2015-04-11 (×6): via ORAL

## 2015-04-09 MED ORDER — GABAPENTIN 300 MG CAP
300 mg | Freq: Three times a day (TID) | ORAL | Status: DC
Start: 2015-04-09 — End: 2015-04-11
  Administered 2015-04-09 – 2015-04-11 (×8): via ORAL

## 2015-04-09 MED ORDER — GLUCAGON 1 MG INJECTION
1 mg | INTRAMUSCULAR | Status: DC | PRN
Start: 2015-04-09 — End: 2015-04-11

## 2015-04-09 MED ORDER — GUAIFENESIN 100 MG/5 ML ORAL LIQUID
100 mg/5 mL | ORAL | Status: DC | PRN
Start: 2015-04-09 — End: 2015-04-11
  Administered 2015-04-09: 21:00:00 via ORAL

## 2015-04-09 MED ORDER — GLUCOSE 4 GRAM CHEWABLE TAB
4 gram | ORAL | Status: DC | PRN
Start: 2015-04-09 — End: 2015-04-11

## 2015-04-09 MED ORDER — BUSPIRONE 10 MG TAB
10 mg | Freq: Three times a day (TID) | ORAL | Status: DC
Start: 2015-04-09 — End: 2015-04-11
  Administered 2015-04-09 – 2015-04-11 (×8): via ORAL

## 2015-04-09 MED ORDER — .PHARMACY TO SUBSTITUTE PER PROTOCOL
Status: DC | PRN
Start: 2015-04-09 — End: 2015-04-09

## 2015-04-09 MED ORDER — PANTOPRAZOLE 40 MG TAB, DELAYED RELEASE
40 mg | Freq: Every day | ORAL | Status: DC
Start: 2015-04-09 — End: 2015-04-11
  Administered 2015-04-09 – 2015-04-11 (×3): via ORAL

## 2015-04-09 MED ORDER — INSULIN LISPRO 100 UNIT/ML INJECTION
100 unit/mL | Freq: Four times a day (QID) | SUBCUTANEOUS | Status: DC
Start: 2015-04-09 — End: 2015-04-11
  Administered 2015-04-09 – 2015-04-11 (×8): via SUBCUTANEOUS

## 2015-04-09 MED ORDER — LACTATED RINGERS IV
INTRAVENOUS | Status: DC | PRN
Start: 2015-04-09 — End: 2015-04-09
  Administered 2015-04-09: 18:00:00 via INTRAVENOUS

## 2015-04-09 MED ORDER — INSULIN GLARGINE 100 UNIT/ML INJECTION
100 unit/mL | Freq: Two times a day (BID) | SUBCUTANEOUS | Status: DC
Start: 2015-04-09 — End: 2015-04-11
  Administered 2015-04-10 – 2015-04-11 (×4): via SUBCUTANEOUS

## 2015-04-09 MED ORDER — LIDOCAINE HCL 2 % (20 MG/ML) IJ SOLN
20 mg/mL (2 %) | INTRAMUSCULAR | Status: DC | PRN
Start: 2015-04-09 — End: 2015-04-09
  Administered 2015-04-09: 18:00:00 via INTRAVENOUS

## 2015-04-09 MED ORDER — TRAZODONE 50 MG TAB
50 mg | Freq: Every evening | ORAL | Status: DC
Start: 2015-04-09 — End: 2015-04-11
  Administered 2015-04-09 – 2015-04-11 (×3): via ORAL

## 2015-04-09 MED ORDER — ACETAMINOPHEN 325 MG TABLET
325 mg | Freq: Four times a day (QID) | ORAL | Status: DC | PRN
Start: 2015-04-09 — End: 2015-04-11
  Administered 2015-04-10 (×2): via ORAL

## 2015-04-09 MED ORDER — SODIUM CHLORIDE 0.9 % IV
INTRAVENOUS | Status: DC
Start: 2015-04-09 — End: 2015-04-11
  Administered 2015-04-09 – 2015-04-11 (×6): via INTRAVENOUS

## 2015-04-09 MED ORDER — INSULIN LISPRO 100 UNIT/ML INJECTION
100 unit/mL | Freq: Once | SUBCUTANEOUS | Status: AC
Start: 2015-04-09 — End: 2015-04-08
  Administered 2015-04-09: 03:00:00 via SUBCUTANEOUS

## 2015-04-09 MED ORDER — QUETIAPINE SR 200 MG 24 HR TAB
200 mg | Freq: Every evening | ORAL | Status: DC
Start: 2015-04-09 — End: 2015-04-11
  Administered 2015-04-09 – 2015-04-11 (×3): via ORAL

## 2015-04-09 MED ORDER — BENZOCAINE 20 % MUCOSAL AEROSOL SPRAY
20 % | Status: AC
Start: 2015-04-09 — End: 2015-04-10

## 2015-04-09 MED ORDER — LISINOPRIL 20 MG TAB
20 mg | Freq: Once | ORAL | Status: AC
Start: 2015-04-09 — End: 2015-04-08
  Administered 2015-04-09: 03:00:00 via ORAL

## 2015-04-09 MED ORDER — FENOFIBRATE NANOCRYSTALLIZED 145 MG TAB
145 mg | Freq: Every day | ORAL | Status: DC
Start: 2015-04-09 — End: 2015-04-11
  Administered 2015-04-09 – 2015-04-11 (×3): via ORAL

## 2015-04-09 MED ORDER — ONDANSETRON (PF) 4 MG/2 ML INJECTION
4 mg/2 mL | INTRAMUSCULAR | Status: DC | PRN
Start: 2015-04-09 — End: 2015-04-11
  Administered 2015-04-09 – 2015-04-10 (×2): via INTRAVENOUS

## 2015-04-09 MED ORDER — CLONIDINE 0.1 MG TAB
0.1 mg | Freq: Two times a day (BID) | ORAL | Status: DC
Start: 2015-04-09 — End: 2015-04-11
  Administered 2015-04-09 – 2015-04-10 (×4): via ORAL

## 2015-04-09 MED ORDER — HYDROMORPHONE (PF) 1 MG/ML IJ SOLN
1 mg/mL | INTRAMUSCULAR | Status: DC | PRN
Start: 2015-04-09 — End: 2015-04-10
  Administered 2015-04-09 – 2015-04-10 (×8): via INTRAVENOUS

## 2015-04-09 MED ORDER — HYDROCHLOROTHIAZIDE 25 MG TAB
25 mg | Freq: Once | ORAL | Status: AC
Start: 2015-04-09 — End: 2015-04-08
  Administered 2015-04-09: 03:00:00 via ORAL

## 2015-04-09 MED ORDER — CHLORPROMAZINE 25 MG TAB
25 mg | Freq: Three times a day (TID) | ORAL | Status: DC
Start: 2015-04-09 — End: 2015-04-11
  Administered 2015-04-09 – 2015-04-11 (×8): via ORAL

## 2015-04-09 MED FILL — INSULIN GLARGINE 100 UNIT/ML INJECTION: 100 unit/mL | SUBCUTANEOUS | Qty: 1

## 2015-04-09 MED FILL — HYDROCHLOROTHIAZIDE 25 MG TAB: 25 mg | ORAL | Qty: 1

## 2015-04-09 MED FILL — HEPARIN (PORCINE) 5,000 UNIT/ML IJ SOLN: 5000 unit/mL | INTRAMUSCULAR | Qty: 1

## 2015-04-09 MED FILL — CHLORPROMAZINE 25 MG TAB: 25 mg | ORAL | Qty: 1

## 2015-04-09 MED FILL — SERTRALINE 50 MG TAB: 50 mg | ORAL | Qty: 2

## 2015-04-09 MED FILL — HYDROMORPHONE (PF) 1 MG/ML IJ SOLN: 1 mg/mL | INTRAMUSCULAR | Qty: 1

## 2015-04-09 MED FILL — SODIUM CHLORIDE 0.9 % IV: INTRAVENOUS | Qty: 1000

## 2015-04-09 MED FILL — BD POSIFLUSH NORMAL SALINE 0.9 % INJECTION SYRINGE: INTRAMUSCULAR | Qty: 10

## 2015-04-09 MED FILL — LIDOCAINE (PF) 20 MG/ML (2 %) IJ SOLN: 20 mg/mL (2 %) | INTRAMUSCULAR | Qty: 5

## 2015-04-09 MED FILL — FENOFIBRATE NANOCRYSTALLIZED 145 MG TAB: 145 mg | ORAL | Qty: 1

## 2015-04-09 MED FILL — SEROQUEL XR 200 MG TABLET,EXTENDED RELEASE: 200 mg | ORAL | Qty: 4

## 2015-04-09 MED FILL — INSULIN LISPRO 100 UNIT/ML INJECTION: 100 unit/mL | SUBCUTANEOUS | Qty: 1

## 2015-04-09 MED FILL — MORPHINE 2 MG/ML INJECTION: 2 mg/mL | INTRAMUSCULAR | Qty: 1

## 2015-04-09 MED FILL — BUSPIRONE 10 MG TAB: 10 mg | ORAL | Qty: 2

## 2015-04-09 MED FILL — GABAPENTIN 300 MG CAP: 300 mg | ORAL | Qty: 2

## 2015-04-09 MED FILL — BUSPIRONE 5 MG TAB: 5 mg | ORAL | Qty: 1

## 2015-04-09 MED FILL — TRAZODONE 50 MG TAB: 50 mg | ORAL | Qty: 1

## 2015-04-09 MED FILL — GUAIFENESIN 100 MG/5 ML ORAL LIQUID: 100 mg/5 mL | ORAL | Qty: 10

## 2015-04-09 MED FILL — CLONIDINE 0.1 MG TAB: 0.1 mg | ORAL | Qty: 1

## 2015-04-09 MED FILL — SEROQUEL XR 200 MG TABLET,EXTENDED RELEASE: 200 mg | ORAL | Qty: 1

## 2015-04-09 MED FILL — PROTONIX 40 MG TABLET,DELAYED RELEASE: 40 mg | ORAL | Qty: 1

## 2015-04-09 MED FILL — DIPRIVAN 10 MG/ML INTRAVENOUS EMULSION: 10 mg/mL | INTRAVENOUS | Qty: 20

## 2015-04-09 MED FILL — LISINOPRIL 20 MG TAB: 20 mg | ORAL | Qty: 1

## 2015-04-09 MED FILL — ONDANSETRON (PF) 4 MG/2 ML INJECTION: 4 mg/2 mL | INTRAMUSCULAR | Qty: 2

## 2015-04-09 MED FILL — HURRICAINE 20 % MUCOSAL SPRAY: 20 % | Qty: 57

## 2015-04-09 MED FILL — SODIUM CHLORIDE 0.9 % IV: INTRAVENOUS | Qty: 500

## 2015-04-09 NOTE — Other (Signed)
1325) ..TRANSFER - OUT REPORT:    Verbal report given to Jacki ConesLaurie, RN(name) on Amanda Henderson  being transferred to endoscopy (unit) for ordered procedure       Report consisted of patient???s Situation, Background, Assessment and   Recommendations(SBAR).     Information from the following report(s) SBAR, Intake/Output and MAR was reviewed with the receiving nurse.    Lines:   Peripheral IV 04/08/15 Left Antecubital (Active)   Site Assessment Clean, dry, & intact 04/09/2015  2:20 PM   Phlebitis Assessment 0 04/09/2015  2:20 PM   Infiltration Assessment 0 04/09/2015  2:20 PM   Dressing Status Clean, dry, & intact 04/09/2015  2:20 PM   Dressing Type Transparent;Tape 04/09/2015  2:20 PM   Hub Color/Line Status Pink;Infusing 04/09/2015  2:20 PM   Action Taken Blood drawn 04/08/2015  7:24 PM        Opportunity for questions and clarification was provided.      Patient transported with:   Registered Nurse

## 2015-04-09 NOTE — Progress Notes (Signed)
Patient's father brought patient's home levemir and needle caps. Placed patient labels on each and placed in patient bin. Will notify oncoming nurse that order for use of home medication needs to be addressed with daytime hospitalist.

## 2015-04-09 NOTE — Progress Notes (Signed)
RRAT Score: 16  Initial Assessment: CM reviewed chart and met with patient for discharge planning. CM verified patient???s address and contact number as correct on the facesheet. Pt presented to ED with abdominal pain. At the time of discharge patient reported that she would need to utilize Medicaid transportation to get home. Patient is unemployed. Patient consented for CM to make appointment arrangements. Patient???s PCP is [facility].  Patient's appointment is scheduled for 04/12/15 at 12pm. Patient will not need assistance with obtaining medications. Patient voiced that she does have medications at home. Medications refills will likely not be needed on discharge. Patient uses [facility] to obtain medications.    CM reviewed OBS and MOON letter with patient and placed a copies in the chart. Patient provided verbal consent due to her resting in bed.     Emergency Contact:   Neila Gear [phone]  Pertinent Medical Hx: see H&P     Transition Plan: Home with outpatient services.  Involve patient/caregiver in assessment, planning, education and implement of intervention. Yes. CM will continue to follow case for discharge planning.  CM daily patient care huddles/interdisciplinary rounds. Rounded with IDT.    CM will handoff to [place] or PCP practice.  CM evaluated for [facility] or [facility], community care coordination of resources. CM will further assess if needed.     [place], [place]

## 2015-04-09 NOTE — Progress Notes (Signed)
0014: Pain medication given. Primary nurse aware and agreeable to completing pain reassessment.

## 2015-04-09 NOTE — Procedures (Signed)
G I Procedure Note                         EGD    Dr. Despina AriasHorace J. Asante Blanda   [place] office [phone]  [place]    [phone]          Amanda Henderson [place]                              [id number]                                 [id number]   01/03/1986                       30 y.o.                female        Procedure Date: 04/09/2015   Procedure  EGD  with biopsy.           Pre Op Diagnosis:                     1. abd pain n/v                                                                                                                                                                          Post Op Diagnosis:                    1.  Mild Gastritis                                                           2.  Hiatal hernia          H&p completed  Yes    Anesthesia Assessment Performed prior to procedure:No change   Medications Medication Record            Description of Procedure:  Amanda Henderson [place]  was seen in the endoscopy suite and the pre procedure evaluation was completed.  The patient was identified as Amanda Henderson [place]ignore  and the procedure verified as EGD with biopsy.  A Time Out was held and the above information confirmed.    The risks, benefits, complications, treatment options and expected outcomes were discussed with the patient.  The possibilities of reaction to medication, pulmonary aspiration, perforation of a viscus, bleeding, failure  to diagnose a condition and creating a complication requiring transfusion or operation were discussed with the patient who  Permit obtained and risks explained ( INCLUDING PERFORATION AND NEED FOR EMERGENCY SURGERY)     Procedure Note:  With the patient in the left lateral position and after appropriate conscious anesthesia the Olympus Video endoscope was passed under direct vision into the oropharynx.  The oropharynx appeared Normal   The instrument was advanced into the esophagus and the findings were a normal  appearing esophageal mucosa without other anatomic abnormalities.  The instrument was advanced into the stomach through the esophagogastric junction.  The gastroscope was advanced progressively through the stomach visualizing the body and antral areas.  The findings were a moderate gastritis with erosions.  A biopsy was obtained.        The instrument was further advanced through the pylorus into the duodenum.  The bulb of the duodenum and the second portion of the duodenum were examined and the findings were a smooth appearing duodenal mucosa with mild erythema.    The endoscope was withdrawn back into the stomach and retroflexed with examination of the fundus and cardia and the findings were no additional abnormalities .  The instrument was withdrawn back into the esophagus and the the finding were no additional abnormalities    Biopsies were obtained for helicobacter testing and pathologic analysis from the representative areas.      The endoscope was completely withdrawn and the patient tolerated the procedure well.    1.  Blood loss was nominal.  2.  For biopsy  Specimen verification by physician and nurse two sources name,           social security numbers     Suggestions  Plan 1. - Acid suppression with a proton pump inhibitor.  - Await pathology.  - gastric emptying study      Denton Lank M.D.     None

## 2015-04-09 NOTE — Other (Signed)
IDR with Dr. Sallee LangeVarma (MD), Smiley HousemanSoo (pharmacist), Janora NorlanderBristole (case manager), Alycia Rossettiyan (nurse manager), Herbert SetaHeather (RN) to discuss plan of care including pain control, consult to Dr. Noel Geroldohen, consult for Dr. Jean RosenthalJackson.

## 2015-04-09 NOTE — Other (Signed)
Handoff Report from Operating Room to PACU    Report received from Dr. Herbert DeanerLInas and Freida BusmanAllen, RN regarding Amanda Henderson.      Surgeon(s):  Levonne SpillerHorace J Jackson, MD  And Procedure(s) (LRB):  ESOPHAGOGASTRODUODENOSCOPY (EGD) (N/A)  ESOPHAGOGASTRODUODENAL (EGD) BIOPSY (N/A)  confirmed   with allergies discussed.    Anesthesia type, drugs, patient history, complications, estimated blood loss, vital signs, intake and output, and last pain medication, lines and temperature were reviewed.

## 2015-04-09 NOTE — Anesthesia Post-Procedure Evaluation (Signed)
Post-Anesthesia Evaluation and Assessment    Patient: Amanda SlatesHeather Henderson MRN: [id number]  SSN: [id number]    Date of Birth: 09/06/1985  Age: 30 y.o.  Sex: female       Cardiovascular Function/Vital Signs  Visit Vitals   ??? BP 102/56 (BP 1 Location: Right arm, BP Patient Position: At rest)   ??? Pulse 87   ??? Temp 36.8 ??C (98.2 ??F)   ??? Resp 16   ??? Ht 5\' 4"  (1.626 m)   ??? Wt 118.8 kg (262 lb)   ??? SpO2 94%   ??? Breastfeeding No   ??? BMI 44.97 kg/m2       Patient is status post MAC anesthesia for Procedure(s):  ESOPHAGOGASTRODUODENOSCOPY (EGD)  ESOPHAGOGASTRODUODENAL (EGD) BIOPSY.    Nausea/Vomiting: None    Postoperative hydration reviewed and adequate.    Pain:  Pain Scale 1: Numeric (0 - 10) (04/09/15 1430)  Pain Intensity 1: 0 (04/09/15 1430)   Managed    Neurological Status:   Neuro (WDL): Within Defined Limits (04/09/15 1430)  Neuro  LUE Motor Response: Purposeful (04/09/15 1430)  LLE Motor Response: Purposeful (04/09/15 1430)  RUE Motor Response: Purposeful (04/09/15 1430)  RLE Motor Response: Purposeful (04/09/15 1430)   At baseline    Mental Status and Level of Consciousness: Arousable    Pulmonary Status:   O2 Device: Room air (04/09/15 1430)   Adequate oxygenation and airway patent    Complications related to anesthesia: None    Post-anesthesia assessment completed. No concerns    Signed By: Charlene BrookePhilip A Famous Eisenhardt, MD     April 09, 2015

## 2015-04-09 NOTE — Consults (Signed)
Patient seen at request of Dr. Floy Sabina.    Amanda Henderson is an 30 y.o. female who was recently admitted with abdominal pain. Ms. Noh tells me that she began experiencing abdominal pain several days ago. Pain localized to right side. She reports low grade fevers. Denies chills. Associated nausea and vomitting. Denies coffee ground emesis or hematemesis. No h/o NSAID use. No chest pain or shortness of breath. No diarrhea, melena or blood per rectum.  She has otherwise been in her usual state of health.   CT Scan abd/pelvis with IV contrast - No acute findings to correlate with pain, though there is an anterior abdominal wall fat containing hernia with slight anterior adjacent stranding which could potentially be a source for pain. Hepatosplenomegaly with hepatic steatosis.    Allergies - Geodon [ziprasidone hcl]; Haldol [haloperidol lactate]; Ibuprofen; and Toradol [ketorolac]    Meds - Reviewed.    PMH -   Past Medical History:   Diagnosis Date   ??? Cancer ([place])    ??? Depression    ??? Diabetes ([place])    ??? Hypertension    ??? Psychiatric disorder     Anxiety and Schizoaffective disorder     PSH -   Past Surgical History:   Procedure Laterality Date   ??? HX CHOLECYSTECTOMY       Fam Hx - History reviewed. No pertinent family history.    Soc Hx -   Social History   Substance Use Topics   ??? Smoking status: Never Smoker   ??? Smokeless tobacco: Not on file   ??? Alcohol use No     Patient is an obese female in no acute distress.   Visit Vitals   ??? BP 111/61 (BP 1 Location: Right arm, BP Patient Position: At rest;Lying left side)   ??? Pulse 92   ??? Temp 97.7 ??F (36.5 ??C)   ??? Resp 16   ??? Ht 5' 4"  (1.626 m)   ??? Wt 262 lb 6.4 oz (119 kg)   ??? LMP 03/07/2015 (Within Days)   ??? SpO2 95%   ??? Breastfeeding No   ??? BMI 45.04 kg/m2     HEENT: Anicteric.  Neck: Supple without Lymphadenopathy.  Cor: RRR.  Lungs: CTA Bilat.  Abd: Soft. Non distended.            Tender. There is no associated rebound or guarding.    No discrete masses. The hernia defect is not appreciated on exam.  Ext: No edema.  Neuro: Grossly Non focal.   Skin: Well healed abdominal wall incisions.    Labs -   Recent Results (from the past 24 hour(s))   HCG URINE, QL. - POC    Collection Time: 04/08/15  6:55 PM   Result Value Ref Range    Pregnancy test,urine (POC) NEGATIVE  NEG     URINALYSIS W/ REFLEX CULTURE    Collection Time: 04/08/15  6:59 PM   Result Value Ref Range    Color YELLOW/STRAW      Appearance CLOUDY (A) CLEAR      Specific gravity >1.030 (H) 1.003 - 1.030    pH (UA) 5.5 5.0 - 8.0      Protein NEGATIVE  NEG mg/dL    Glucose >1000 (A) NEG mg/dL    Ketone NEGATIVE  NEG mg/dL    Bilirubin NEGATIVE  NEG      Blood NEGATIVE  NEG      Urobilinogen 0.2 0.2 - 1.0 EU/dL    Nitrites NEGATIVE  NEG      Leukocyte Esterase TRACE (A) NEG      WBC 0-4 0 - 4 /hpf    RBC 0-5 0 - 5 /hpf    Epithelial cells FEW FEW /lpf    Bacteria 1+ (A) NEG /hpf    UA:UC IF INDICATED URINE CULTURE ORDERED (A) CNI     CBC WITH AUTOMATED DIFF    Collection Time: 04/08/15  7:14 PM   Result Value Ref Range    WBC 5.2 3.6 - 11.0 K/uL    RBC 4.25 3.80 - 5.20 M/uL    HGB 11.2 (L) 11.5 - 16.0 g/dL    HCT 34.8 (L) 35.0 - 47.0 %    MCV 81.9 80.0 - 99.0 FL    MCH 26.4 26.0 - 34.0 PG    MCHC 32.2 30.0 - 36.5 g/dL    RDW 14.4 11.5 - 14.5 %    PLATELET 174 150 - 400 K/uL    NEUTROPHILS 73 32 - 75 %    LYMPHOCYTES 18 12 - 49 %    MONOCYTES 6 5 - 13 %    EOSINOPHILS 3 0 - 7 %    BASOPHILS 0 0 - 1 %    ABS. NEUTROPHILS 3.8 1.8 - 8.0 K/UL    ABS. LYMPHOCYTES 0.9 0.8 - 3.5 K/UL    ABS. MONOCYTES 0.3 0.0 - 1.0 K/UL    ABS. EOSINOPHILS 0.2 0.0 - 0.4 K/UL    ABS. BASOPHILS 0.0 0.0 - 0.1 K/UL   METABOLIC PANEL, COMPREHENSIVE    Collection Time: 04/08/15  7:14 PM   Result Value Ref Range    Sodium 135 (L) 136 - 145 mmol/L    Potassium 3.9 3.5 - 5.1 mmol/L    Chloride 98 97 - 108 mmol/L    CO2 26 21 - 32 mmol/L    Anion gap 11 5 - 15 mmol/L    Glucose 324 (H) 65 - 100 mg/dL    BUN 7 6 - 20 MG/DL     Creatinine 0.69 0.55 - 1.02 MG/DL    BUN/Creatinine ratio 10 (L) 12 - 20      GFR est AA >60 >60 ml/min/1.41m    GFR est non-AA >60 >60 ml/min/1.737m   Calcium 8.7 8.5 - 10.1 MG/DL    Bilirubin, total 0.3 0.2 - 1.0 MG/DL    ALT (SGPT) 52 12 - 78 U/L    AST (SGOT) 68 (H) 15 - 37 U/L    Alk. phosphatase 121 (H) 45 - 117 U/L    Protein, total 7.5 6.4 - 8.2 g/dL    Albumin 3.5 3.5 - 5.0 g/dL    Globulin 4.0 2.0 - 4.0 g/dL    A-G Ratio 0.9 (L) 1.1 - 2.2     LIPASE    Collection Time: 04/08/15  7:14 PM   Result Value Ref Range    Lipase 180 73 - 393 U/L   LACTIC ACID, PLASMA    Collection Time: 04/08/15  7:14 PM   Result Value Ref Range    Lactic acid 2.1 (HH) 0.4 - 2.0 MMOL/L   GLUCOSE, POC    Collection Time: 04/08/15 10:02 PM   Result Value Ref Range    Glucose (POC) 279 (H) 65 - 100 mg/dL    Performed by LeManson Passey  CBC WITH AUTOMATED DIFF    Collection Time: 04/09/15  4:14 AM   Result Value Ref Range    WBC 4.3 3.6 - 11.0  K/uL    RBC 4.08 3.80 - 5.20 M/uL    HGB 10.5 (L) 11.5 - 16.0 g/dL    HCT 34.2 (L) 35.0 - 47.0 %    MCV 83.8 80.0 - 99.0 FL    MCH 25.7 (L) 26.0 - 34.0 PG    MCHC 30.7 30.0 - 36.5 g/dL    RDW 14.8 (H) 11.5 - 14.5 %    PLATELET 159 150 - 400 K/uL    NEUTROPHILS 61 32 - 75 %    LYMPHOCYTES 28 12 - 49 %    MONOCYTES 7 5 - 13 %    EOSINOPHILS 4 0 - 7 %    BASOPHILS 0 0 - 1 %    ABS. NEUTROPHILS 2.7 1.8 - 8.0 K/UL    ABS. LYMPHOCYTES 1.2 0.8 - 3.5 K/UL    ABS. MONOCYTES 0.3 0.0 - 1.0 K/UL    ABS. EOSINOPHILS 0.2 0.0 - 0.4 K/UL    ABS. BASOPHILS 0.0 0.0 - 0.1 K/UL   METABOLIC PANEL, BASIC    Collection Time: 04/09/15  4:14 AM   Result Value Ref Range    Sodium 137 136 - 145 mmol/L    Potassium 3.9 3.5 - 5.1 mmol/L    Chloride 102 97 - 108 mmol/L    CO2 26 21 - 32 mmol/L    Anion gap 9 5 - 15 mmol/L    Glucose 204 (H) 65 - 100 mg/dL    BUN 6 6 - 20 MG/DL    Creatinine 0.53 (L) 0.55 - 1.02 MG/DL    BUN/Creatinine ratio 11 (L) 12 - 20      GFR est AA >60 >60 ml/min/1.69m     GFR est non-AA >60 >60 ml/min/1.755m   Calcium 8.2 (L) 8.5 - 10.1 MG/DL   MAGNESIUM    Collection Time: 04/09/15  4:14 AM   Result Value Ref Range    Magnesium 1.6 1.6 - 2.4 mg/dL   PHOSPHORUS    Collection Time: 04/09/15  4:14 AM   Result Value Ref Range    Phosphorus 4.7 2.6 - 4.7 MG/DL     CT Scan - Reviewed.    Imp: Pt. Is a [age].o. female with abdominal pain and a fat containing abdominal wall hernia.    Plan: 1. At this point in time, do not believe that there is an indication for surgical intervention.    2. Clear liquid diet and advance as tolerated.    3. Protonix as ordered.    4. GI to see.   5. Pain medication and anti-emetics as needed.    6. Ms. BoRyseray ultimately benefit from hernia repair after workup.   7. Plans per Dr. VaBilly Fischer  Following.

## 2015-04-09 NOTE — Procedures (Signed)
Procedures  by Amanda Spiller, MD at 04/09/15 1411                Author: Levonne Spiller, MD  Service: Internal Medicine  Author Type: Physician       Filed: 04/09/15 1413  Date of Service: 04/09/15 1411  Status: Signed          Editor: Amanda Spiller, MD (Physician)            Pre-procedure Diagnoses        1. Epigastric pain [R10.13]                           Post-procedure Diagnoses        1. Acute superficial gastritis without hemorrhage [K29.00]                           Procedures        1. EGD [ZOX0960 (Custom)]                                                                                                            G I Procedure Note                          EGD         Dr. Despina Henderson office [phone]   [place] Office    [phone]           Amanda Henderson                              [id number]                                 [id number]    04/25/1985                       30 y.o.                 female               Procedure Date: 04/09/2015     Procedure  EGD  with biopsy.                      Pre Op Diagnosis:                      1. abd pain n/v  Post Op Diagnosis:                     1.  Mild Gastritis                                                                2.  Hiatal hernia                    H&p completed   Yes       Anesthesia Assessment  Performed prior to procedure:No change      Medications  Medication Record                       Description of Procedure:   Amanda SlatesHeather Henderson  was seen in the endoscopy suite and the pre procedure evaluation was completed.  The patient was identified as Amanda Henderson  and the procedure verified as EGD with biopsy.  A Time  Out was held and the above information confirmed.       The risks, benefits, complications, treatment options and expected  outcomes were discussed with the patient.  The possibilities of reaction to medication,  pulmonary aspiration, perforation of a viscus, bleeding, failure to diagnose a condition and creating a complication requiring transfusion or operation were discussed with the patient who    Permit obtained and risks explained ( INCLUDING PERFORATION AND NEED FOR EMERGENCY SURGERY)          Procedure Note:   With the patient in the left lateral position and after appropriate conscious anesthesia the  Olympus Video endoscope was passed under direct vision into the oropharynx.   The oropharynx appeared Normal    The instrument was advanced into the esophagus and the findings were  a normal appearing esophageal mucosa without other anatomic abnormalities.  The instrument was advanced into the stomach through the esophagogastric junction.  The gastroscope was advanced  progressively through the stomach visualizing the body and antral areas.  The findings were a moderate gastritis with erosions.  A  biopsy was obtained.          The instrument was further advanced through the pylorus into the duodenum.  The bulb of the duodenum and the second portion  of the duodenum were examined and the findings were a smooth appearing duodenal mucosa with mild erythema.     The endoscope was withdrawn back into the stomach and retroflexed with examination of the fundus and cardia and the findings  were no additional abnormalities .  The instrument was withdrawn back into  the esophagus and the the finding were no additional abnormalities     Biopsies were obtained for helicobacter testing and pathologic analysis  from the representative areas.         The endoscope was completely withdrawn and the patient tolerated the procedure well.      1.  Blood loss was nominal.   2.  For biopsy  Specimen verification by physician and nurse two sources name,           social security numbers       Suggestions   Plan 1. - Acid suppression with a proton pump  inhibitor.   - Await pathology.   - gastric emptying  study          Amanda Henderson M.D.       None

## 2015-04-09 NOTE — Other (Signed)
TRANSFER - OUT REPORT:    Verbal report given to Frederika RN(name) on Amanda Henderson  being transferred to 225 for routine post - op       Report consisted of patient???s Situation, Background, Assessment and   Recommendations(SBAR).     Information from the following report(s) SBAR and Procedure Summary was reviewed with the receiving nurse.    Opportunity for questions and clarification was provided.      Patient transported with:   Registered Nurse

## 2015-04-09 NOTE — Other (Signed)
One puff of Hurricaine sprayed in mouth by Dr. Jean RosenthalJackson,

## 2015-04-09 NOTE — Consults (Signed)
GI Consult    Amanda Henderson [id number]                         [id number]       01/14/1986  Date/Time:                    04/09/2015 12:49 PM   female  30 y.o.                Assessment: complex medical problem set with severity index requiring assessment of all medical problems with discussion with consultants and attending physician :    Patient Active Problem List    Diagnosis Date Noted   ??? Long-term insulin use in type 2 diabetes (HCC)     ??? BMI 40.0-44.9, adult (HCC)    ??? Essential hypertension    ??? Psychiatric disorder    ??? Generalized abdominal pain N/v abd pain seems to related to hernia more.   Agree with need for egd with gerd and mild dysphagia.   Ct negative for sbo or abscess  With Dm maybe gastroparesis in addition   ??? Ventral hernia without obstruction or gangrene    ??? Type 2 diabetes mellitus without complication (HCC)                                                          Plan:  1. egd  2. Gastric emptying if egd negative   .                                       Risk of deterioration:          Low           Moderate          High                                               Time spent with patient:       15 mins                                25 mins                                                      60 minutes                                   Critical Care Provided                                             >50% of visit spent in  counseling                             and coordination of  care        Communication with           Patient                              Family                                              Care Manager                 Nursing                      Specialist /PCP                                                       ___________________________________________________    Physician: Levonne SpillerHorace J Alexine Pilant, MD                Subjective:      INFORMANT    Patient     Family    CHIEF COMPLAINT:      HISTORY OF PRESENT ILLNESS:     Abdominal  pain      The patient is  a 30 y.o. WHITE OR CAUCASIAN female who was admitted on 04/08/2015 with abdominalpain, ventral hernia. The patient presented and I have been consulted for the evaluation and management of Nausea / Vomiting  Patient complains of nausea and vomiting. Onset of symptoms was several days ago. Patient describes nausea as moderate. Vomiting has occurred several times over the past days. Vomitus is described as bilious. Symptoms have been associated with inability to keep down fluids.  Patient denies hematemesis, melena, alcohol overuse. Course to date has been gradually worsening.  Evaluation to date has been seen in ER: admitted. .     The pain is located in the epigastrium and in the periumbilical area without radiation. Patient describes the pain as aching, sharp and stabbing, continuous, and rated as moderate and severe.   Aggravating factors include movement and eating. Alleviating factors include none. Associated symptoms include nausea and vomiting.  Additional biliary/liver/pancreatic  symptoms denied include pruritis, jaundice, chills    pancreatitis and pancreatic trauma.                Allergies   Allergen Reactions   ??? Geodon [Ziprasidone Hcl] Other (comments)     " mouth lock up"   ??? Haldol [Haloperidol Lactate] Other (comments)     " mouth lock up".    ??? Ibuprofen Swelling     Throat swelling   ??? Toradol [Ketorolac] Swelling     Throat Swelling      Past Medical History:   Diagnosis Date   ??? Cancer (HCC)    ??? Depression    ??? Diabetes (HCC)    ??? Hypertension    ??? Psychiatric disorder     Anxiety and Schizoaffective disorder       Past Surgical History:   Procedure  Laterality Date   ??? HX CHOLECYSTECTOMY         Social History   Substance Use Topics   ??? Smoking status: Never Smoker   ??? Smokeless tobacco: Not on file   ??? Alcohol use No        History reviewed. No pertinent family history.    Prior to Admission medications    Medication Sig Start Date End Date Taking? Authorizing Provider   gabapentin (NEURONTIN) 600 mg tablet Take 600 mg by mouth three (3) times daily.   Yes Historical Provider   insulin detemir (LEVEMIR FLEXPEN) 100 unit/mL (3 mL) inpn 15 Units by SubCUTAneous route nightly.   Yes Phys Other, MD   cloNIDine HCl (CATAPRES) 0.1 mg tablet Take 0.1 mg by mouth two (2) times a day.   Yes Phys Other, MD   traZODone (DESYREL) 50 mg tablet Take 50 mg by mouth nightly.   Yes Phys Other, MD   busPIRone (BUSPAR) 15 mg tablet Take 15 mg by mouth three (3) times daily.   Yes Phys Other, MD   sertraline (ZOLOFT) 100 mg tablet Take 100 mg by mouth two (2) times a day.   Yes Phys Other, MD   fenofibrate nanocrystallized (TRICOR) 145 mg tablet Take 145 mg by mouth daily.   Yes Phys Other, MD   QUEtiapine SR (SEROQUEL XR) 400 mg sr tablet Take 800 mg by mouth nightly.   Yes Phys Other, MD   chlorproMAZINE (THORAZINE) 10 mg tablet Take 25 mg by mouth three (3) times daily.   Yes Phys Other, MD   pantoprazole (PROTONIX) 40 mg tablet Take 40 mg by mouth daily.   Yes Phys Other, MD   lisinopril-hydroCHLOROthiazide (PRINZIDE, ZESTORETIC) 20-12.5 mg per tablet Take 1 Tab by mouth daily.   Yes Phys Other, MD   insulin aspart (NOVOLOG) 100 unit/mL injection by SubCUTAneous route. SLIDING SCALE   Yes Phys Other, MD                                       REVIEW OF SYSTEMS                                           Total of 13 systems reviewed as follows                                                                                        SYSTEM RESPONSE ADDITIONAL COMMENTS             Constitutional: feels ill, fatigued and generally weak                                      Eyes:  denies, blurry vision, eye pain, spots before eyes, blind areas    GLUCOMA   Nose: Denies:   nasal congestion, nasal discharge    EARS:  Denies:  Discharge, tinnitus,   Decrease hearing     Throat Denies: sore throat, history of soreness, discoloration of the tongue    Respiratory:   no cough, shortness of breath, or wheezing    ASTHMA     COPD       Cardiovascular:    positive for fatigue, negative for chest pain, chest pressure/discomfort     HYPERTENSION     CAD   GI:   See h&p     Genitourinary:   Denies: dysuria,  frequency/urgency,  hematuria      RENAL FAILURE       CHRONIC KIDNEY DISEASE        Integument:   Denies: rash, itching, hives    Hematologic:   Denies: swollen lymph  nodes, bleeding, bruising    Musculoskel:   joint pain     OSTEOARTHRITIS      RHEUMATOID ARTHRITIS   GOUT   Neurological:  Denies: focal weakness, speech problems, loss of consciousness    Neuropathy      CVA     Endocrine:            Denies: palpitations, skin changes, temperature intolerance                                                         DM type 2         HYPOTHYROID      Psychiatric:      Negative except for      Depression      Anxiety     Bipolar                                                     Objective:     VITALS:    Visit Vitals   ??? BP 106/53   ??? Pulse 90   ??? Temp 97.6 ??F (36.4 ??C)   ??? Resp 18   ??? Ht 5\' 4"  (1.626 m)   ??? Wt 119 kg (262 lb 6.4 oz)   ??? LMP 03/07/2015 (Within Days)   ??? SpO2 92%   ??? Breastfeeding No   ??? BMI 45.04 kg/m2      Temp (24hrs), Avg:97.9 ??F (36.6 ??C), Min:97.6 ??F (36.4 ??C), Max:98.4 ??F (36.9 ??C)       O2 Device: Room air                                                  PHYSICAL EXAM:      General:  Alert, cooperative, no distress, appears stated age.   Head:  Normocephalic, without obvious abnormality, atraumatic.   Ears:  Canals clear TM normal appearence   Eyes:  Conjunctivae           Normal   Pallor      corneas clear and pupils equal,   Nose:   Nares normal. No drainage or sinus tenderness.   Mouth:    Throat:   Mouth:-     No Loose teeth  missing teeth         Loose teeth   Finger opening: 1  1.5  2  2.5  3  3.5    4     Mallampati: Class 1   Class 2  Class 3    Class 4      Neck - supple,       Full ROM  Decreased ROM   Short Neck no significant adenopathy    Lips and tongue normal.  No Thrush  mucus membranes  Normal      Dry     Neck: Supple, symmetrical,  no adenopathy, thyroid: non tender no carotid bruit and no JVD.   Lymph nodes: Cervical, supraclavicular normal.   Lungs:   Clear to auscultation and percussion  Normal      Rales                   Rhonchi         Wheezing     .   Chest wall:  No tenderness or deformity. No Accessory muscle use.   Heart:  Regular rate and rhythm,  no murmur, rub or gallop. Pulses equal 3+ carotids,brachials,radials,femorals,dp    Abdomen: Bowel sounds are normal, liver is not enlarged, spleen is not enlargedabnormal findings:  obese, distended, tenderness moderate in the epigastrium and in the periumbilical area   Extremities:  Extremities normal, atraumatic, No cyanosis.  No edema. No clubbing   Skin:  Texture, turgor normal. No rashes or lesions.  Not Jaundiced    Neurologic: EOMs intact. No facial asymmetry. No aphasia or slurred speech. Normal strength, alert and oriented ,time, place, person    Psych: Adequate  insight.  Not depressed  anxious or agitated.                                                         Lab Data Reviewed:                                                                      CBC:      Recent Labs      04/09/15   0414  04/08/15   1914   WBC  4.3  5.2   RBC  4.08  4.25   HGB  10.5*  11.2*   HCT  34.2*  34.8*   PLT  159  174   GRANS  61  73   LYMPH  28  18   EOS  4  3                                                                    CHEMISTRY:    Recent Labs      04/09/15   0414  04/08/15   1914   GLU  204*  324*  NA  137  135*   K  3.9  3.9   CL  102  98   CO2  26  26   BUN  6  7   CREA  0.53*  0.69   CA  8.2*  8.7   AGAP  9  11   BUCR  11*  10*   AP   --   121*   TP   --   7.5   ALB   --   3.5   GLOB   --   4.0   AGRAT   --   0.9*                                                             LIVER ENZYMES:                                 Recent Labs      04/08/15   1914   TP  7.5   ALB  3.5   AP  121*   SGOT  68*                                                           Coagulation Profile                                               No results for input(s): INR in the last 72 hours.    No lab exists for component: PT, PTT, INREXT                                                    INFLAMMATION STUDIES:                                                No results found for: SR, CRP                                                                                                    Physician: Levonne Spiller, MD

## 2015-04-09 NOTE — Anesthesia Pre-Procedure Evaluation (Signed)
Anesthetic History   No history of anesthetic complications            Review of Systems / Medical History  Patient summary reviewed and pertinent labs reviewed    Pulmonary  Within defined limits                 Neuro/Psych         Psychiatric history     Cardiovascular    Hypertension              Exercise tolerance: <4 METS     GI/Hepatic/Renal  Within defined limits              Endo/Other    Diabetes    Morbid obesity     Other Findings              Physical Exam    Airway  Mallampati: IV  TM Distance: 4 - 6 cm  Neck ROM: short neck   Mouth opening: Diminished (comment)     Cardiovascular    Rhythm: regular  Rate: normal         Dental    Dentition: Upper dentition intact and Lower dentition intact     Pulmonary  Breath sounds clear to auscultation               Abdominal  GI exam deferred       Other Findings            Anesthetic Plan    ASA: 3  Anesthesia type: MAC            Anesthetic plan and risks discussed with: Patient

## 2015-04-09 NOTE — Other (Signed)
..  TRANSFER - IN REPORT:    Verbal report received from Jacki ConesLaurie, Charity fundraiserN (name) on Christene SlatesHeather Denno  being received from endoscopy (unit) for routine post - op      Report consisted of patient???s Situation, Background, Assessment and   Recommendations(SBAR).     Information from the following report(s) Procedure Summary and MAR was reviewed with the receiving nurse.    Opportunity for questions and clarification was provided.      Assessment completed upon patient???s arrival to unit and care assumed.

## 2015-04-09 NOTE — H&P (Addendum)
[facility] [place]  Admission History and Physical      NAME:  Amanda Henderson   DOB:   04/30/1985   MRN:  [id number]     PCP:  None     Date/Time:  04/09/2015     PATIENT ADMITTED OVERNIGHT BY THE TELEHOSPITALIST     CHIEF COMPLAINT: abdominal pain    HISTORY OF PRESENT ILLNESS:     [name] is a 30 y.o.  WHITE OR CAUCASIAN female with PMX of HTN,DM,PVD,rhabdomyosarcoma and PShx significant for cholecystectomy, hernia repair,  who presented to the Emergency Department today complaining of abdominal pain.  Patient reports periumbilical,RLQ pain since past week which has been getting progressively worse in the last two days. 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  Pt reports that she is originally from [place] but that her oncologist was in [place], and that she recently moved to [place] to live with her parents   In ED,CT Scan abd/pelvis with IV contrast - No acute findings to correlate with pain, though there is an anterior abdominal wall fat containing hernia with slight anterior adjacent stranding which could potentially be a source for pain. Hepatosplenomegaly with hepatic steatosis.        Past Medical History:   Diagnosis Date   ??? Cancer (HCC)    ??? Depression    ??? Diabetes (HCC)    ??? Hypertension    ??? Psychiatric disorder     Anxiety and Schizoaffective disorder        Past Surgical History:   Procedure Laterality Date   ??? HX CHOLECYSTECTOMY         Social History   Substance Use Topics   ??? Smoking status: Never Smoker   ??? Smokeless tobacco: Not on file   ??? Alcohol use No        History reviewed. No pertinent family history CANCER,htn,dm     Allergies   Allergen Reactions   ??? Geodon [Ziprasidone Hcl] Other (comments)     " mouth lock up"    ??? Haldol [Haloperidol Lactate] Other (comments)     " mouth lock up".    ??? Ibuprofen Swelling     Throat swelling   ??? Toradol [Ketorolac] Swelling     Throat Swelling        Prior to Admission medications    Medication Sig Start Date End Date Taking? Authorizing Provider   gabapentin (NEURONTIN) 600 mg tablet Take 600 mg by mouth three (3) times daily.   Yes Historical Provider   insulin detemir (LEVEMIR FLEXPEN) 100 unit/mL (3 mL) inpn 15 Units by SubCUTAneous route nightly.   Yes Phys Other, MD   cloNIDine HCl (CATAPRES) 0.1 mg tablet Take 0.1 mg by mouth two (2) times a day.   Yes Phys Other, MD   traZODone (DESYREL) 50 mg tablet Take 50 mg by mouth nightly.   Yes Phys Other, MD   busPIRone (BUSPAR) 15 mg tablet Take 15 mg by mouth three (3) times daily.   Yes Phys Other, MD   sertraline (ZOLOFT) 100 mg tablet Take 100 mg by mouth two (2) times a day.   Yes Phys Other, MD   fenofibrate nanocrystallized (TRICOR) 145 mg tablet Take 145 mg  by mouth daily.   Yes Phys Other, MD   QUEtiapine SR (SEROQUEL XR) 400 mg sr tablet Take 800 mg by mouth nightly.   Yes Phys Other, MD   chlorproMAZINE (THORAZINE) 10 mg tablet Take 25 mg by mouth three (3) times daily.   Yes Phys Other, MD   pantoprazole (PROTONIX) 40 mg tablet Take 40 mg by mouth daily.   Yes Phys Other, MD   lisinopril-hydroCHLOROthiazide (PRINZIDE, ZESTORETIC) 20-12.5 mg per tablet Take 1 Tab by mouth daily.   Yes Phys Other, MD   insulin aspart (NOVOLOG) 100 unit/mL injection by SubCUTAneous route. SLIDING SCALE   Yes Phys Other, MD         Review of Systems:    Constitutional: negative for fevers, chills, sweats, fatigue, malaise, anorexia and weight loss   Eyes: negative for irritation, redness and icterus   Ears, nose, mouth, throat, and face: negative for ear drainage, earaches, nasal congestion, sore mouth and sore throat   Respiratory: negative for cough, sputum, hemoptysis, pleurisy/chest pain, asthma, wheezing or dyspnea on exertion    Cardiovascular: negative for dyspnea, palpitations, irregular heart beats, near-syncope, syncope, orthopnea, paroxysmal nocturnal dyspnea, lower extremity edema, tachypnea   Gastrointestinal: as per hpi  Genitourinary:negative for frequency and dysuria   Hematologic/lymphatic: negative for easy bruising, bleeding, lymphadenopathy, petechiae and coughing up blood   Musculoskeletal:negative for myalgias, arthralgias and muscle weakness   Neurological: negative for headaches and dizziness   Endocrine: Denies heat or cold intolerance       Objective:      VITALS:    Vital signs reviewed; most recent are:    Visit Vitals   ??? BP 106/53   ??? Pulse 90   ??? Temp 97.6 ??F (36.4 ??C)   ??? Resp 18   ??? Ht  (1.626 m)   ??? Wt 119 kg (262 lb 6.4 oz)   ??? SpO2 92%   ??? Breastfeeding No   ??? BMI 45.04 kg/m2     SpO2 Readings from Last 6 Encounters:   04/09/15 92%   03/21/15 99%            Intake/Output Summary (Last 24 hours) at 04/09/15 1201  Last data filed at 04/09/15 0929   Gross per 24 hour   Intake              770 ml   Output                0 ml   Net              770 ml            Exam:     Physical Exam:    Gen:  obese, well-nourished, in no acute distress  HEENT:  Pink conjunctivae, PERRL, hearing intact to voice, moist mucous membranes  Neck:  Supple, without masses, thyroid non-tender  Resp:  No accessory muscle use, clear breath sounds without wheezes rales or rhonchi  Card:  No murmurs, normal S1, S2 without thrills, bruits or peripheral edema  Abd:  Soft, peri umblical-tender+,RLQ tenderness+, non-distended, normoactive bowel sounds are present, no palpable organomegaly  Lymph:  No cervical adenopathy  Musc:  No cyanosis or clubbing  Skin:  No rashes or ulcers, skin turgor is good  Neuro:  Cranial nerves 3-12 are grossly intact, grip strength is 5/5 bilaterally, dorsi / plantarflexion strength is 5/5 bilaterally, follows commands appropriately  Psych:  Alert with good insight.  Oriented to person, place,  and time        Labs:    Recent Labs      04/09/15   0414   WBC  4.3   HGB  10.5*   HCT  34.2*   PLT  159     Recent Labs      04/09/15   0414  04/08/15   1914   NA  137  135*   K  3.9  3.9   CL  102  98   CO2  26  26   GLU  204*  324*   BUN  6  7   CREA  0.53*  0.69   CA  8.2*  8.7   MG  1.6   --    PHOS  4.7   --    ALB   --   3.5   SGOT   --   68*   ALT   --   52     No components found for: GLPOC  No results for input(s): PH, PCO2, PO2, HCO3, FIO2 in the last 72 hours.  No results for input(s): INR in the last 72 hours.    No lab exists for component: INREXT, INREXT    Chest Xray:   EKG reviewed:      CT ABDOMEN:IMPRESSION:  1. No acute findings to correlate with pain, though there is a anterior  abdominal wall fat-containing hernia with slight anteriorly adjacent stranding  which could potentially be a source for pain.  2. Hepatosplenomegaly with hepatic steatosis.  3. Additional incidental findings as above.      My assessment of this patient's clinical condition and my plan of care is as follows: ??  Given the patient's current clinical presentation, I have a high level of concern for decompensation if discharged from the ED. Complex decision making was performed which includes reviewing the patient's available past medical records, laboratory results, and Xray films. I have also directly communicated my plan and discussed this case with the involved ED physician. ??   Assessment/Plan:          Generalized abdominal pain sec to Ventral hernia without obstruction or gangrene POA   General surgery on board. No plans for ACUTE surgical intervention at this point . They recommend getting  GI consult for Endoscopy to rule out gastritis / Hiatal hernia which might be causing symptoms.    Continue PPI, PRN dilaudid,IVF      Type 2 diabetes mellitus without complication (HCC) WITH Long-term insulin use in type    2 diabetes (HCC)      ISS ,Lantus,A1c      BMI 40.0-44.9, adult (HCC)      Essential hypertension        Psychiatric disorder      Resume home meds    Code Status:  Full Code    Surrogate decision maker:williams,pam    Total time spent with patient: 2070 Minutes                  Care Plan discussed with: Patient, Care Manager, Nursing Staff and Consultant/Specialist    Discussed:  Care Plan    Prophylaxis:  Lovenox    Probable Disposition:  Home w/Family           ___________________________________________________    Attending Physician: Cyndia DiverKanika W Javious Hallisey, MD

## 2015-04-09 NOTE — Other (Incomplete)
Bedside and Verbal shift change report given to Oaklyn RN (oncoming nurse) by Anne Marie RN (offgoing nurse). Report included the following information SBAR, Kardex, Intake/Output, MAR, Accordion and Recent Results.

## 2015-04-09 NOTE — Progress Notes (Signed)
0700) Bedside shift change report given to Herbert SetaHeather, RN (oncoming nurse) by Gar GibbonAnne Marie, RN (offgoing nurse). Report included the following information SBAR, Kardex, MAR, Accordion and Recent Results.   [id number]) Dr. Noel Geroldohen with pt  1157) Pain medicine  1330) Pt downstairs for endoscopy  1630) Consult pharmacy for potential interaction propofol and thorazine.  Recommend baseline EKG  1635) Consult Dr. Sallee LangeVarma for EKG and cough syrup.  1910) Bedside shift change report given to Judeth CornfieldStephanie, Charity fundraiserN (oncoming nurse) by Herbert SetaHeather, RN (offgoing nurse). Report included the following information SBAR, Kardex, MAR, Accordion and Recent Results.

## 2015-04-10 LAB — URINALYSIS W/ REFLEX CULTURE
Bacteria: NEGATIVE /hpf
Bilirubin: NEGATIVE
Blood: NEGATIVE
Glucose: >1000 mg/dL — AB
Ketone: NEGATIVE mg/dL
Leukocyte Esterase: NEGATIVE
Nitrites: NEGATIVE
Protein: NEGATIVE mg/dL
Specific gravity: 1.01 (ref 1.003–1.030)
Urobilinogen: 0.2 EU/dL (ref 0.2–1.0)
pH (UA): 5.5 (ref 5.0–8.0)

## 2015-04-10 LAB — METABOLIC PANEL, BASIC
Anion gap: 7 mmol/L (ref 5–15)
BUN/Creatinine ratio: 9 — ABNORMAL LOW (ref 12–20)
BUN: 5 MG/DL — ABNORMAL LOW (ref 6–20)
CO2: 30 mmol/L (ref 21–32)
Calcium: 7.9 MG/DL — ABNORMAL LOW (ref 8.5–10.1)
Chloride: 99 mmol/L (ref 97–108)
Creatinine: 0.57 MG/DL (ref 0.55–1.02)
GFR est AA: >60 mL/min/{1.73_m2} (ref >60)
GFR est non-AA: >60 mL/min/{1.73_m2} (ref >60)
Glucose: 213 mg/dL — ABNORMAL HIGH (ref 65–100)
Potassium: 4 mmol/L (ref 3.5–5.1)
Sodium: 136 mmol/L (ref 136–145)

## 2015-04-10 LAB — GLUCOSE, POC
Glucose (POC): 190 mg/dL — ABNORMAL HIGH (ref 65–100)
Glucose (POC): 225 mg/dL — ABNORMAL HIGH (ref 65–100)
Glucose (POC): 195 mg/dL — ABNORMAL HIGH (ref 65–100)
Glucose (POC): 209 mg/dL — ABNORMAL HIGH (ref 65–100)

## 2015-04-10 LAB — CBC W/O DIFF
HCT: 35.2 % (ref 35.0–47.0)
HGB: 11.3 g/dL — ABNORMAL LOW (ref 11.5–16.0)
MCH: 26.7 PG (ref 26.0–34.0)
MCHC: 32.1 g/dL (ref 30.0–36.5)
MCV: 83 FL (ref 80.0–99.0)
PLATELET: 179 10*3/uL (ref 150–400)
RBC: 4.24 M/uL (ref 3.80–5.20)
RDW: 14.3 % (ref 11.5–14.5)
WBC: 4.2 10*3/uL (ref 3.6–11.0)

## 2015-04-10 LAB — CULTURE, URINE
Colonies Counted: >100000
Colony Count: >100000

## 2015-04-10 MED FILL — CHLORPROMAZINE 25 MG TAB: 25 mg | ORAL | Qty: 1

## 2015-04-10 MED FILL — PROTONIX 40 MG TABLET,DELAYED RELEASE: 40 mg | ORAL | Qty: 1

## 2015-04-10 MED FILL — INSULIN LISPRO 100 UNIT/ML INJECTION: 100 unit/mL | SUBCUTANEOUS | Qty: 1

## 2015-04-10 MED FILL — GABAPENTIN 300 MG CAP: 300 mg | ORAL | Qty: 2

## 2015-04-10 MED FILL — HEPARIN (PORCINE) 5,000 UNIT/ML IJ SOLN: 5000 unit/mL | INTRAMUSCULAR | Qty: 1

## 2015-04-10 MED FILL — MAPAP (ACETAMINOPHEN) 325 MG TABLET: 325 mg | ORAL | Qty: 2

## 2015-04-10 MED FILL — HYDROMORPHONE (PF) 1 MG/ML IJ SOLN: 1 mg/mL | INTRAMUSCULAR | Qty: 1

## 2015-04-10 MED FILL — FENOFIBRATE NANOCRYSTALLIZED 145 MG TAB: 145 mg | ORAL | Qty: 1

## 2015-04-10 MED FILL — BUSPIRONE 10 MG TAB: 10 mg | ORAL | Qty: 2

## 2015-04-10 MED FILL — CLONIDINE 0.1 MG TAB: 0.1 mg | ORAL | Qty: 1

## 2015-04-10 MED FILL — LISINOPRIL 20 MG TAB: 20 mg | ORAL | Qty: 1

## 2015-04-10 MED FILL — TRAZODONE 50 MG TAB: 50 mg | ORAL | Qty: 1

## 2015-04-10 MED FILL — INSULIN GLARGINE 100 UNIT/ML INJECTION: 100 unit/mL | SUBCUTANEOUS | Qty: 1

## 2015-04-10 MED FILL — SEROQUEL XR 200 MG TABLET,EXTENDED RELEASE: 200 mg | ORAL | Qty: 4

## 2015-04-10 MED FILL — SERTRALINE 50 MG TAB: 50 mg | ORAL | Qty: 2

## 2015-04-10 MED FILL — HYDROCHLOROTHIAZIDE 25 MG TAB: 25 mg | ORAL | Qty: 1

## 2015-04-10 MED FILL — ONDANSETRON (PF) 4 MG/2 ML INJECTION: 4 mg/2 mL | INTRAMUSCULAR | Qty: 2

## 2015-04-10 NOTE — Progress Notes (Signed)
Called from nursing today MD requested an outpatient Sleep Study Test for CPAP. Will call [facility] on Monday to arrange appointment.   MD referral for Endocrinology Appointment will call on Monday.   MD referral for Gastric Emptying Study outpatient cannot be arranged unless written  orders or referral given by Physician . Informed nursing to follow-up with MD to determine who will do this.     Velvet Mangum-Holmes MSW RN   [phone]

## 2015-04-10 NOTE — Progress Notes (Addendum)
0710hrs..Bedside and Verbal shift change report given to ConsecoLiza Control and instrumentation engineer(oncoming nurse) by Judeth CornfieldStephanie (offgoing nurse). Report included the following information SBAR, Kardex, Intake/Output, MAR, Recent Results and Med Rec Status.    0745hrs patient's oxygen saturation dropped to low 80s on room air, patient was sleeping (history of sleep apnea). Patient woke up, head was elevated, monitored oxygen saturation, still stays at 80s. Administered oxygen thru nasal canula at 2L, monitored, saturation went up to 95%. Will continue to monitor patient.    0900hrs Clonidine PO scheduled was held since BP was low. MD was made aware.    0930hrs Patient placed on room air while sitting up on bed, monitored oxygen saturation, it was steady at low 90s (91-93%), placed patient again on nasal canula at 2L, it went up to 95%. MD was made aware of this.    1030hrs Care manager [facility](Velvet) was called as per MD to schedule sleep study, endocrinologist outpatient and also Gastric emptying. As per care manager, gastric emptying needs to be ordered by an MD. This was relayed to Dr. Sallee LangeVarma. As per MD, gastric emptying will be taken cared of by her PCP.    1500hrs patient complaint of straining when voiding, a new hat was placed on toilet for voiding. MD was made aware and ordered Urinalysis. Urine sample was sent to lab.    1700hrs Patient tolerated well her consistent carb meal, no new abdominal pain, no nausea and vomiting, consumed 100% of it.    1910hrs .Marland Kitchen.Bedside and Verbal shift change report given to Carlita Control and instrumentation engineer(oncoming nurse) by Warden FillersLiza (offgoing nurse). Report included the following information SBAR, Kardex, Intake/Output, MAR, Recent Results and Med Rec Status.

## 2015-04-10 NOTE — Other (Signed)
Bedside and Verbal shift change report given to Jillene Wehrenberg RN (oncoming nurse) by Liza RN (offgoing nurse).  Report given with SBAR, Kardex, MAR and Recent Results.

## 2015-04-10 NOTE — Progress Notes (Signed)
[facility]  Ruffin FrederickKanika Elliot Simoneaux, MD    Hospitalist Progress Note      NAME: Amanda SlatesHeather Henderson   DOB:  09/03/1985  MRM:  [id number]    Date/Time: 04/10/2015  10:40 AM         Assessment / Plan:      Generalized abdominal pain sec to Ventral hernia without obstruction or gangrene POA improving   General surgery on board. No plans for ACUTE surgical intervention at this point . They recommend getting  GI consult for Endoscopy to rule out gastritis / Hiatal hernia which might be causing symptoms.    Continue PPI, PRN dilaudid,IVF     Endoscopy 3/17:  Mild gastritis, Hiatal hernia  Continue PPI  she would also require an outpatient gastric emptying study done for concerns of diabetic gastroparesis sec to uncontrolled diabetes and persistent nausea .    Appreciate GEN surg and GI recomendaitons       Type 2 diabetes mellitus without complication (HCC) WITH Long-term insulin use in type    2 diabetes (HCC)      ISS ,Lantus,A1c-10.7  Increase dose of lantus on d/c to 15 units BID       BMI 40.0-44.9, adult [facility](HCC)       Essential hypertension        Psychiatric disorder       Resume home meds    OSA/OHS?  Patient will require outpatient sleep study done .She states that [place] and was supposed to be on two liters oxygen via nasal cannula. Denies current use of CPAP but  states she was supposed to be on one?Marland Kitchen. We'll ask him to assist scheduling an appointment for the patient .                Subjective: abdomen pain is better, still nauseated          Objective:       Vitals:          Last 24hrs VS reviewed since prior progress note. Most recent are:    Visit Vitals   ??? BP 108/64 (BP 1 Location: Right arm, BP Patient Position: At rest)   ??? Pulse (!) 110   ??? Temp 98.6 ??F (37 ??C)   ??? Resp 19   ??? Ht 5\' 4"  (1.626 m)   ??? Wt 118.4 kg (261 lb)   ??? SpO2 95%   ??? Breastfeeding No   ??? BMI 44.8 kg/m2     SpO2 Readings from Last 6 Encounters:   04/10/15 95%   03/21/15 99%             Intake/Output Summary (Last 24 hours) at 04/10/15 1040  Last data filed at 04/10/15 [id number]   Gross per 24 hour   Intake          2923.33 ml   Output                0 ml   Net          2923.33 ml          Exam:     Physical Exam:    Gen:  obese, well-nourished, in no acute distress  HEENT:  Pink conjunctivae, PERRL, hearing intact to voice, moist mucous membranes  Neck:  Supple, without masses, thyroid non-tender  Resp:  No accessory muscle use, clear breath sounds without wheezes rales or rhonchi  Card:  No murmurs, normal S1,  S2 without thrills, bruits or peripheral edema  Abd:  Soft, peri umblical-tender+,RLQ tenderness+, non-distended, normoactive bowel sounds are present, no palpable organomegaly  Lymph:  No cervical adenopathy  Musc:  No cyanosis or clubbing  Skin:  No rashes or ulcers, skin turgor is good  Neuro:  Cranial nerves 3-12 are grossly intact, grip strength is 5/5 bilaterally, dorsi / plantarflexion strength is 5/5 bilaterally, follows commands appropriately  Psych:  Alert with good insight.  Oriented to person, place, and time          Telemetry reviewed:   normal sinus rhythm    Medications Reviewed: (see below)    Lab Data Reviewed: (see below)    ______________________________________________________________________    Medications:     Current Facility-Administered Medications   Medication Dose Route Frequency   ??? acetaminophen (TYLENOL) tablet 650 mg  650 mg Oral Q6H PRN   ??? insulin glargine (LANTUS) injection 15 Units  15 Units SubCUTAneous Q12H   ??? guaiFENesin (ROBITUSSIN) 100 mg/5 mL oral liquid 100 mg  100 mg Oral Q4H PRN   ??? sodium chloride (NS) flush 5-10 mL  5-10 mL IntraVENous Q8H   ??? sodium chloride (NS) flush 5-10 mL  5-10 mL IntraVENous PRN   ??? sodium chloride (NS) flush 5-10 mL  5-10 mL IntraVENous Q8H   ??? sodium chloride (NS) flush 5-10 mL  5-10 mL IntraVENous PRN   ??? ondansetron (ZOFRAN) injection 4 mg  4 mg IntraVENous Q4H PRN    ??? heparin (porcine) injection 5,000 Units  5,000 Units SubCUTAneous Q8H   ??? 0.9% sodium chloride infusion  75 mL/hr IntraVENous CONTINUOUS   ??? busPIRone (BUSPAR) tablet 15 mg  15 mg Oral TID   ??? chlorproMAZINE (THORAZINE) tablet 25 mg  25 mg Oral TID   ??? cloNIDine HCl (CATAPRES) tablet 0.1 mg  0.1 mg Oral BID   ??? fenofibrate nanocrystallized (TRICOR) tablet 145 mg  145 mg Oral DAILY   ??? pantoprazole (PROTONIX) tablet 40 mg  40 mg Oral DAILY   ??? QUEtiapine SR (SEROquel XR) TABLET 800 mg  800 mg Oral QHS   ??? sertraline (ZOLOFT) tablet 100 mg  100 mg Oral BID   ??? traZODone (DESYREL) tablet 50 mg  50 mg Oral QHS   ??? lisinopril (PRINIVIL, ZESTRIL) tablet 20 mg  20 mg Oral DAILY   ??? hydroCHLOROthiazide (HYDRODIURIL) tablet 12.5 mg  12.5 mg Oral DAILY   ??? gabapentin (NEURONTIN) capsule 600 mg  600 mg Oral TID   ??? insulin lispro (HUMALOG) injection   SubCUTAneous AC&HS   ??? glucose chewable tablet 16 g  4 Tab Oral PRN   ??? dextrose (D50W) injection syrg 12.5-25 g  12.5-25 g IntraVENous PRN   ??? glucagon (GLUCAGEN) injection 1 mg  1 mg IntraMUSCular PRN            Lab Review:     Recent Labs      04/10/15   0400  04/09/15   0414  04/08/15   1914   WBC  4.2  4.3  5.2   HGB  11.3*  10.5*  11.2*   HCT  35.2  34.2*  34.8*   PLT  179  159  174     Recent Labs      04/10/15   0400  04/09/15   0414  04/08/15   1914   NA  136  137  135*   K  4.0  3.9  3.9   CL  99  102  98   CO2  GLU  213*  204*  324*   BUN  5*  6  7   CREA  0.57  0.53*  0.69   CA  7.9*  8.2*  8.7   MG   --   1.6   --    PHOS   --   4.7   --    ALB   --    --   3.5   SGOT   --    --   68*   ALT   --    --   52     No components found for: [facility]    Code Status:  Full Code    Surrogate Decision Maker:     Total time spent with patient: 30 Minutes                  Care Plan discussed with: Patient and Nursing Staff    Discussed:  Care Plan    Prophylaxis:  Lovenox    Disposition:  Home w/Family           ___________________________________________________     Attending Physician: Cyndia Diver, MD

## 2015-04-11 LAB — GLUCOSE, POC
Glucose (POC): 171 mg/dL — ABNORMAL HIGH (ref 65–100)
Glucose (POC): 197 mg/dL — ABNORMAL HIGH (ref 65–100)
Glucose (POC): 244 mg/dL — ABNORMAL HIGH (ref 65–100)

## 2015-04-11 MED ORDER — PANTOPRAZOLE 40 MG TAB, DELAYED RELEASE
40 mg | ORAL_TABLET | Freq: Every day | ORAL | 0 refills | Status: DC
Start: 2015-04-11 — End: 2015-04-12

## 2015-04-11 MED ORDER — INSULIN DETEMIR 100 UNIT/ML (3 ML) SUB-Q PEN
100 unit/mL (3 mL) | Freq: Two times a day (BID) | SUBCUTANEOUS | 0 refills | Status: DC
Start: 2015-04-11 — End: 2015-04-12

## 2015-04-11 MED FILL — GABAPENTIN 300 MG CAP: 300 mg | ORAL | Qty: 2

## 2015-04-11 MED FILL — INSULIN LISPRO 100 UNIT/ML INJECTION: 100 unit/mL | SUBCUTANEOUS | Qty: 1

## 2015-04-11 MED FILL — CHLORPROMAZINE 25 MG TAB: 25 mg | ORAL | Qty: 1

## 2015-04-11 MED FILL — LISINOPRIL 20 MG TAB: 20 mg | ORAL | Qty: 1

## 2015-04-11 MED FILL — BD POSIFLUSH NORMAL SALINE 0.9 % INJECTION SYRINGE: INTRAMUSCULAR | Qty: 10

## 2015-04-11 MED FILL — BUSPIRONE 10 MG TAB: 10 mg | ORAL | Qty: 2

## 2015-04-11 MED FILL — HEPARIN (PORCINE) 5,000 UNIT/ML IJ SOLN: 5000 unit/mL | INTRAMUSCULAR | Qty: 1

## 2015-04-11 MED FILL — INSULIN GLARGINE 100 UNIT/ML INJECTION: 100 unit/mL | SUBCUTANEOUS | Qty: 1

## 2015-04-11 MED FILL — PROTONIX 40 MG TABLET,DELAYED RELEASE: 40 mg | ORAL | Qty: 1

## 2015-04-11 MED FILL — SERTRALINE 50 MG TAB: 50 mg | ORAL | Qty: 2

## 2015-04-11 MED FILL — SEROQUEL XR 200 MG TABLET,EXTENDED RELEASE: 200 mg | ORAL | Qty: 4

## 2015-04-11 MED FILL — HYDROCHLOROTHIAZIDE 25 MG TAB: 25 mg | ORAL | Qty: 1

## 2015-04-11 MED FILL — TRAZODONE 50 MG TAB: 50 mg | ORAL | Qty: 1

## 2015-04-11 MED FILL — FENOFIBRATE NANOCRYSTALLIZED 145 MG TAB: 145 mg | ORAL | Qty: 1

## 2015-04-11 NOTE — Progress Notes (Signed)
1026hrs Called and discussed with Museum/gallery exhibitions officerVelvet (care manager) re: Discharge order: for appointment with Dr. Noel Geroldohen (surgery evaluation) and gastric emptying study  to be put on PCP notes.

## 2015-04-11 NOTE — Progress Notes (Signed)
0900hrs clonidine was held at this time, since BP was low. MD was made aware.    1300hrs Discharge instructions was discussed with patient, opportunity for questions was provided. Appointments for PCP, surgery, Sleep study was all arranged by Care management, patient was aware she will be contacted tomorrow for appointment by care management. IV access was removed. Patient's own medication (Insulin pen and needles) were returned to patient.    1310hrs patient was discharged to home, picked up by her father, patient was alert and oriented, no complaint of pain and distress.

## 2015-04-11 NOTE — Progress Notes (Addendum)
Care Management Follow-up call   Reviewed chart. Called patient verified for correct patient   patient did received her discharge instruction. She states she kept her PCP appointment today at 12 Noon.  Provided her with the information for for two other appointments [facility] and [facility].   Explained the need for referral from her PCP office.  Will send In-basket to [facility].   Hospitalist, Dr. Sallee LangeVarma  also wants patient to have Gastric Emptying Study  And needs to be ordered by PCP Office.   She states her father will pick up her prescription today.  Did not give permission to follow-up with anyone else     Follow-up Information    ?? Follow up With Details Comments Contact Info   ?? [facility] On 04/12/2015 Your appointment is scheduled for 04/12/15 at 12pm. The contact # is [phone] [address]   [address]   [place]  [id number]   [phone]   ?? Endocrinology   ??CM willl call you on Monday to arrange an appointment   ?? [facility] On 05/11/2015 CM will call you on Monday to arrange an appointment/ Appointment Arranged and available April 18th ??@11 :20AM and will need a referral from PCP  [address]   [place] [id number]   [phone]   ?? Philipp Ovensennis Cohen, MD  CM will call on Monday to arranged follow-up appointment  [address]   [address]   [facility]   [place] [id number]   [phone]      ?? Philipp Ovensennis Cohen, MD On 04/22/2015 ??Appointment ??04/22/15 @ 2:30PM. you will need referral from PCP. Bring insurance Card and ID. Arrive ??20 min early You will be seen at the address above ??[facility] location in Medical office building  [address]   [address]   [facility]   [place] [id number]   [phone]      ?? [facility] [address]   [address]   [place] [id number]   [phone]

## 2015-04-11 NOTE — Discharge Summary (Signed)
[facility]    Physician Discharge Summary     Patient ID:  Amanda Henderson  [id number]  29 y.o.  1985-08-19    Admit date: 04/08/2015    Discharge date and time: 04/11/2015    Admission Diagnoses: abdominalpain, ventral hernia  abd pain n/v    Discharge Diagnoses:    Principal Problem:    Ventral hernia without obstruction or gangrene (04/08/2015)    Active Problems:    Generalized abdominal pain (04/08/2015)      Type 2 diabetes mellitus without complication (HCC) (04/08/2015)      Long-term insulin use in type 2 diabetes (HCC) (04/09/2015)      BMI 40.0-44.9, adult (HCC) (04/09/2015)      Essential hypertension (04/09/2015)      Psychiatric disorder (04/09/2015)         Hospital Course:   Amanda Henderson is a 30 y.o.  WHITE OR CAUCASIAN female with PMX of HTN,DM,PVD,rhabdomyosarcoma and PShx significant for cholecystectomy, hernia repair,  who presented to the Emergency Department today complaining of abdominal pain.  Patient reports periumbilical,RLQ pain since past week which has been getting progressively worse in the last two days. 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  Pt reports that she is originally from [place] but that her oncologist was in [place], and that she recently moved to [place] to live with her parents   In ED,CT Scan abd/pelvis with IV contrast - No acute findings to correlate with pain, though there is an anterior abdominal wall fat containing hernia with slight anterior adjacent stranding which could potentially be a source for pain. Hepatosplenomegaly with hepatic steatosis.       Generalized abdominal pain sec to Ventral hernia without obstruction or gangrene POA improving    General surgery on board. No plans for ACUTE surgical intervention at this point . They recommend getting  GI consult for Endoscopy to rule out gastritis / Hiatal hernia which might be causing symptoms.    Continue PPI, PRN dilaudid,IVF     Endoscopy 3/17:  Mild gastritis, Hiatal hernia  Continue PPI  she would also require an outpatient gastric emptying study done for concerns of diabetic gastroparesis sec to uncontrolled diabetes and persistent nausea .     Appreciate GEN surg and GI recomendaitons     Dr Noel Gerold will f/u patient outpatient, CM to assist setting up an appoinment        Type 2 diabetes mellitus without complication (HCC) WITH Long-term insulin use in type    2 diabetes (HCC)      ISS ,Lantus,A1c-10.7  Increase dose of lantus on d/c to 15 units BID       BMI 40.0-44.9, adult [facility])       Essential hypertension        Psychiatric disorder       Resume home meds     OSA/OHS?  Patient will require outpatient sleep study done .She states that she had a sleep study done in [place] and was supposed to be on two liters oxygen via nasal cannula. Denies current use of CPAP but  states she was supposed to be on one?Marland Kitchen We'll ask CM to assist scheduling an appointment for the patient .  PCP: None     Consults: General Surgery    Discharge Exam:  Gen:  No acute distress  Resp:  No accessory muscle use, clear breath sounds without wheezes rales or rhonchi  Card:  No murmurs, normal S1, S2 without thrills, bruits or peripheral edema  Abd:  Soft, non-tender, non-distended, normoactive bowel sounds are present, no palpable organomegaly  Skin:  No rashes or ulcers, skin turgor is good  Neuro:  Cranial nerves 3-12 are grossly intact, grip strength is 5/5 bilaterally, dorsi / plantarflexion strength is 5/5 bilaterally, follows commands appropriately    Discharge condition: Afrebrile  Ambulating  Eating, Drinking, Voiding  Stable    Disposition: home    Patient Instructions:   Diet: Diabetic Diet    Activity: Activity as tolerated      Wound Care: None needed   Care Plan discussed with: Patient/Family        Follow up   Follow-up Information     Follow up With Details Comments Contact Info    [facility] On 04/12/2015 Your appointment is scheduled for 04/12/15 at 12pm. [address]  [address]  [place] [address]  [phone]    Endocrinology    CM willl call you on Monday to arrange an appointment    [facility]  CM will call you on Monday to arrange an appointment  [address]  [place] [address]  [phone]    Philipp Ovens, MD  CM will call on Monday to arranged follow-up appointment  [address]  [address]  [place] [address]  [phone]      None   None (395) Patient stated that they have no PCP           Current Discharge Medication List      CONTINUE these medications which have CHANGED    Details   insulin detemir (LEVEMIR FLEXPEN) 100 unit/mL (3 mL) inpn 15 Units by SubCUTAneous route ACB/HS for 30 days.  Qty: 9 mL, Refills: 0      !! pantoprazole (PROTONIX) 40 mg tablet Take 1 Tab by mouth daily.  Qty: 30 Tab, Refills: 0       !! - Potential duplicate medications found. Please discuss with provider.      CONTINUE these medications which have NOT CHANGED    Details   gabapentin (NEURONTIN) 600 mg tablet Take 600 mg by mouth three (3) times daily.      cloNIDine HCl (CATAPRES) 0.1 mg tablet Take 0.1 mg by mouth two (2) times a day.      traZODone (DESYREL) 50 mg tablet Take 50 mg by mouth nightly.      busPIRone (BUSPAR) 15 mg tablet Take 15 mg by mouth three (3) times daily.      sertraline (ZOLOFT) 100 mg tablet Take 100 mg by mouth two (2) times a day.      fenofibrate nanocrystallized (TRICOR) 145 mg tablet Take 145 mg by mouth daily.      QUEtiapine SR (SEROQUEL XR) 400 mg sr tablet Take 800 mg by mouth nightly.       chlorproMAZINE (THORAZINE) 10 mg tablet Take 25 mg by mouth three (3) times daily.      !! pantoprazole (PROTONIX) 40 mg tablet Take 40 mg by mouth daily.      lisinopril-hydroCHLOROthiazide (PRINZIDE, ZESTORETIC) 20-12.5 mg per tablet Take 1 Tab by mouth daily.  insulin aspart (NOVOLOG) 100 unit/mL injection by SubCUTAneous route. SLIDING SCALE       !! - Potential duplicate medications found. Please discuss with provider.           Significant Diagnostic Studies:   Recent Labs      04/10/15   0400  04/09/15   0414   WBC  4.2  4.3   HGB  11.3*  10.5*   HCT  35.2  34.2*   PLT  179  159     Recent Labs      04/10/15   0400  04/09/15   0414  04/08/15   1914   NA  136  137  135*   K  4.0  3.9  3.9   CL  99  102  98   CO2  30  26  26    BUN  5*  6  7   CREA  0.57  0.53*  0.69   GLU  213*  204*  324*   CA  7.9*  8.2*  8.7   MG   --   1.6   --    PHOS   --   4.7   --      Recent Labs      04/08/15   1914   SGOT  68*   ALT  52   AP  121*   TBILI  0.3   TP  7.5   ALB  3.5   GLOB  4.0   LPSE  180     No results for input(s): INR, PTP, APTT in the last 72 hours.    No lab exists for component: INREXT, INREXT   No results for input(s): FE, TIBC, PSAT, FERR in the last 72 hours.   No results for input(s): PH, PCO2, PO2 in the last 72 hours.  No results for input(s): CPK, CKMB in the last 72 hours.    No lab exists for component: TROPONINI  Lab Results   Component Value Date/Time    Glucose (POC) 197 04/11/2015 08:00 AM    Glucose (POC) 171 04/10/2015 07:57 PM    Glucose (POC) 209 04/10/2015 04:22 PM    Glucose (POC) 195 04/10/2015 11:53 AM    Glucose (POC) 225 04/10/2015 08:03 AM          Approximate time spent in patient care on day of discharge: 30 minutes    Signed:  Cyndia DiverKanika W Merisa Julio, MD  04/11/2015  11:39 AM

## 2015-04-12 ENCOUNTER — Ambulatory Visit
Admit: 2015-04-12 | Discharge: 2015-04-12 | Payer: PRIVATE HEALTH INSURANCE | Attending: Family Medicine | Primary: Family Medicine

## 2015-04-12 DIAGNOSIS — IMO0001 Reserved for inherently not codable concepts without codable children: Secondary | ICD-10-CM

## 2015-04-12 LAB — EKG, 12 LEAD, INITIAL
Atrial Rate: 97 {beats}/min
Calculated P Axis: 37 degrees
Calculated R Axis: -14 degrees
Calculated T Axis: 12 degrees
Diagnosis: NORMAL
P-R Interval: 180 ms
Q-T Interval: 388 ms
QRS Duration: 104 ms
QTC Calculation (Bezet): 492 ms
Ventricular Rate: 97 {beats}/min

## 2015-04-12 LAB — AMB POC URINE PREGNANCY TEST, VISUAL COLOR COMPARISON: HCG urine, Ql. (POC): NEGATIVE

## 2015-04-12 MED ORDER — BUSPIRONE 15 MG TAB
15 mg | ORAL_TABLET | Freq: Three times a day (TID) | ORAL | 2 refills | Status: AC
Start: 2015-04-12 — End: ?

## 2015-04-12 MED ORDER — TRAZODONE 50 MG TAB
50 mg | ORAL_TABLET | Freq: Every evening | ORAL | 2 refills | Status: AC
Start: 2015-04-12 — End: ?

## 2015-04-12 MED ORDER — SERTRALINE 100 MG TAB
100 mg | ORAL_TABLET | Freq: Two times a day (BID) | ORAL | 2 refills | Status: AC
Start: 2015-04-12 — End: ?

## 2015-04-12 MED ORDER — BLOOD SUGAR DIAGNOSTIC TEST STRIPS
ORAL_STRIP | 5 refills | Status: DC
Start: 2015-04-12 — End: 2015-05-10

## 2015-04-12 MED ORDER — LISINOPRIL-HYDROCHLOROTHIAZIDE 20 MG-12.5 MG TAB
ORAL_TABLET | Freq: Every day | ORAL | 2 refills | Status: AC
Start: 2015-04-12 — End: ?

## 2015-04-12 MED ORDER — CLONIDINE 0.1 MG TAB
0.1 mg | ORAL_TABLET | Freq: Two times a day (BID) | ORAL | 2 refills | Status: AC
Start: 2015-04-12 — End: ?

## 2015-04-12 MED ORDER — FENOFIBRATE NANOCRYSTALLIZED 145 MG TAB
145 mg | ORAL_TABLET | Freq: Every day | ORAL | 2 refills | Status: AC
Start: 2015-04-12 — End: ?

## 2015-04-12 MED ORDER — CHLORPROMAZINE 10 MG TAB
10 mg | ORAL_TABLET | Freq: Three times a day (TID) | ORAL | 2 refills | Status: DC
Start: 2015-04-12 — End: 2015-05-10

## 2015-04-12 MED ORDER — QUETIAPINE SR 400 MG 24 HR TAB
400 mg | ORAL_TABLET | Freq: Every evening | ORAL | 2 refills | Status: DC
Start: 2015-04-12 — End: 2015-04-26

## 2015-04-12 MED ORDER — INSULIN NEEDLES (DISPOSABLE) 31 X 5/16"
31 gauge x 5/16" | PACK | 11 refills | Status: AC
Start: 2015-04-12 — End: ?

## 2015-04-12 MED ORDER — GABAPENTIN 600 MG TAB
600 mg | ORAL_TABLET | Freq: Three times a day (TID) | ORAL | 2 refills | Status: AC
Start: 2015-04-12 — End: ?

## 2015-04-12 MED ORDER — INSULIN ASPART 100 UNIT/ML INJECTION
100 unit/mL | SUBCUTANEOUS | 2 refills | Status: AC
Start: 2015-04-12 — End: ?

## 2015-04-12 MED ORDER — INSULIN DETEMIR 100 UNIT/ML (3 ML) SUB-Q PEN
100 unit/mL (3 mL) | Freq: Every evening | SUBCUTANEOUS | 2 refills | Status: DC
Start: 2015-04-12 — End: 2015-05-10

## 2015-04-12 NOTE — Progress Notes (Signed)
Amanda Henderson is a 30 y.o. female, who's a new patient to our practice.     Previous PCP: [facility] [place]  Last seen there: 02/2015    She has recently moved here.     Multiples psych and complicated uncontrolled diabetes.      Recent admission to hospital for Ventral hernia without obstruction or gangrene (04/08/2015).    Since discharge she is eating and drinking fine, BM normal.  No fever or chills.  She does still have some abdominal wall pain.  She's due to f/u with her surgeon.     Psych: will refer to psych.     Meds refill of all her meds.   Labs reviewed, essentials checked.     A1C >10.  Uncontrolled DM2.  She report checking her BG regularly but didn't bring her logs book with her.     Reviewed and update: active problem list, medication list, allergies, family history, social history    Pertinent items are noted in HPI.    Allergies   Allergen Reactions   ??? Geodon [Ziprasidone Hcl] Other (comments)     " mouth lock up"   ??? Haldol [Haloperidol Lactate] Other (comments)     " mouth lock up".    ??? Ibuprofen Swelling     Throat swelling   ??? Toradol [Ketorolac] Swelling     Throat Swelling     Current Outpatient Prescriptions on File Prior to Visit   Medication Sig Dispense Refill   ??? pantoprazole (PROTONIX) 40 mg tablet Take 40 mg by mouth daily.       No current facility-administered medications on file prior to visit.      Patient Active Problem List   Diagnosis Code   ??? Generalized abdominal pain R10.84   ??? Ventral hernia without obstruction or gangrene K43.9   ??? Long-term insulin use in type 2 diabetes (HCC) E11.9, Z79.4   ??? BMI 40.0-44.9, adult (HCC) Z68.41   ??? Essential hypertension I10   ??? Psychiatric disorder F99   ??? Encounter for long-term current use of high risk medication Z79.899   ??? History of cholecystectomy Z90.49   ??? Gastroesophageal reflux disease without esophagitis K21.9   ??? Diabetes mellitus due to underlying condition, uncontrolled, without  complication, with long-term current use of insulin (HCC) E08.9, Z79.4, E08.65   ??? Hiatal hernia K44.9   ??? Anxiety and depression F41.9, F32.9   ??? Bipolar affective disorder, currently depressed, mild (HCC) F31.31   ??? 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 BP 132/80 (BP 1 Location: Left arm, BP Patient Position: Sitting)   ??? Pulse (!) 103   ??? Temp 98.6 ??F (37 ??C) (Oral)   ??? Resp 16   ??? Ht  (1.626 m)   ??? Wt 257 lb 12.8 oz (116.9 kg)   ??? LMP 03/07/2015 (Within Days)   ??? SpO2 95%   ??? BMI 44.25 kg/m2     General appearance: alert, cooperative, no distress, appears stated age, morbidly obese  Head: Normocephalic, without obvious abnormality, atraumatic  Eyes: conjunctivae/corneas clear. PERRL, EOM's intact.  Neck: supple, symmetrical, trachea midline and no JVD  Lungs: clear to auscultation bilaterally  Heart: regular rate and rhythm, S1, S2 normal, no murmur, click, rub or gallop  Abdomen: soft, non-tender. protruded  Extremities: extremities normal, atraumatic, no cyanosis or edema  Neurologic: Grossly normal    Results for orders placed or performed in visit on 04/12/15   AMB POC URINE PREGNANCY TEST, VISUAL COLOR COMPARISON   Result Value Ref Range    VALID INTERNAL CONTROL POC Yes     HCG urine, Ql. (POC) Negative Negative       Assessment/Plans:    Amanda SetaHeather was seen today for establish care and medication refill.    Diagnoses and all orders for this visit:    Hospital discharge follow-up    Diabetes mellitus due to underlying condition, uncontrolled, without complication, with long-term current use of insulin (HCC)  -     insulin detemir (LEVEMIR FLEXPEN) 100 unit/mL (3 mL) inpn; 30 Units by SubCUTAneous route nightly for 30 days.  -     gabapentin (NEURONTIN) 600 mg tablet; Take 1 Tab by mouth three (3) times daily.  -     insulin aspart (NOVOLOG) 100 unit/mL injection; SLIDING SCALE     Essential hypertension  -     cloNIDine HCl (CATAPRES) 0.1 mg tablet; Take 1 Tab by mouth two (2) times a day.  -     fenofibrate nanocrystallized (TRICOR) 145 mg tablet; Take 1 Tab by mouth daily.  -     lisinopril-hydroCHLOROthiazide (PRINZIDE, ZESTORETIC) 20-12.5 mg per tablet; Take 1 Tab by mouth daily.    Psychiatric disorder  -     traZODone (DESYREL) 50 mg tablet; Take 1 Tab by mouth nightly.  -     busPIRone (BUSPAR) 15 mg tablet; Take 1 Tab by mouth three (3) times daily.  -     sertraline (ZOLOFT) 100 mg tablet; Take 1 Tab by mouth two (2) times a day.  -     QUEtiapine SR (SEROQUEL XR) 400 mg sr tablet; Take 2 Tabs by mouth nightly.  -     chlorproMAZINE (THORAZINE) 10 mg tablet; Take 2.5 Tabs by mouth three (3) times daily.    Gastroesophageal reflux disease without esophagitis    Encounter for long-term current use of high risk medication    History of cholecystectomy    Hiatal hernia    Schizophrenia, schizo-affective type (HCC)  -     REFERRAL TO PSYCHIATRY    Bipolar affective disorder, currently depressed, mild (HCC)  -     REFERRAL TO PSYCHIATRY    Anxiety and depression  -     REFERRAL TO PSYCHIATRY    Peripheral sensory neuropathy due to type 2 diabetes mellitus (HCC)    Other orders  -     glucose blood VI test strips (ASCENSIA AUTODISC VI, ONE TOUCH ULTRA TEST VI) strip; Test strips per patient choice.  Check twice a day  -     Insulin Needles, Disposable, 31 gauge x 5/16" ndle; For flexpen.  Per patient's choice      Discussed plans, risk/benefits of treatments/observations.   Through the use of shared decision making, above plans were agreed upon.   Medication compliance advised.  Patient verbalized understanding.     Follow-up Disposition:  Return in about 4 weeks (around 05/10/2015) for annual exam.      Amanda FieldsNguyen Rex Magee, MD  04/12/2015

## 2015-04-12 NOTE — Progress Notes (Signed)
This Clinical research associatewriter informed PCP and nurse inpatient case management has arranged appointments and patient will need referrals for sleep study and surgeon. Also hospitalist wanted patient to have gastric emptying. Patient has follow up appointment scheduled with PCP on 05/10/15.

## 2015-04-12 NOTE — Progress Notes (Signed)
Chief Complaint   Patient presents with   ??? Establish Care     [facility]  EA:[id number]:ventral hernia   ??? Medication Refill     all meds   Wants all scripts printed to take with her because she does not know which [facility] she is going to use.

## 2015-04-12 NOTE — Addendum Note (Signed)
Addended by: Raeanne BarryAWSON, Kailey Esquilin G on: 04/12/2015 04:39 PM      Modules accepted: Orders

## 2015-04-13 ENCOUNTER — Encounter

## 2015-04-13 NOTE — Telephone Encounter (Signed)
Pls call [facility] pharmacy in ref to: chlorproMAZINE (THORAZINE) 25 mg tablet       Written for 25 mg, they want to change to 10 mg for easier dosing for the patient     And Novolog - needs script for syringes     Best number to reach them is [phone]

## 2015-04-13 NOTE — Telephone Encounter (Signed)
-----   Message from Rolm BaptiseLisa M Hudson sent at 04/13/2015 10:45 AM EDT -----  Regarding: Dr. Lajuana MatteNgtam/Telephone  Patient has 2 appts coming up for her sleep apnea. Her first appt in on 3/30 with Dr. Pixie Casinoennis Comian. She is going to have [facility]. Her second appt is on 4/18 at [facility]. She needs a referral for these appts. She would like to see if the referral can be mailed to her address.    She also came in on 3/20 and got a Rx for 'Ceraquil'. She takes them 2x a day but she only got 30 pills. Best contact number is [phone].

## 2015-04-14 NOTE — Telephone Encounter (Signed)
Referral to Dr. Noel Geroldohen processed and faxed to specialty office.

## 2015-04-20 MED FILL — DIPRIVAN 10 MG/ML INTRAVENOUS EMULSION: 10 mg/mL | INTRAVENOUS | Qty: 150

## 2015-04-20 MED FILL — LIDOCAINE HCL 2 % (20 MG/ML) IJ SOLN: 20 mg/mL (2 %) | INTRAMUSCULAR | Qty: 60

## 2015-04-21 NOTE — Addendum Note (Signed)
Addended by: Raeanne BarryAWSON, Neosha Switalski G on: 04/21/2015 04:58 PM      Modules accepted: Orders

## 2015-04-22 ENCOUNTER — Ambulatory Visit: Admit: 2015-04-22 | Discharge: 2015-04-22 | Payer: MEDICARE | Attending: Surgery | Primary: Family Medicine

## 2015-04-22 DIAGNOSIS — K439 Ventral hernia without obstruction or gangrene: Secondary | ICD-10-CM

## 2015-04-22 MED ORDER — HYDROCODONE-ACETAMINOPHEN 5 MG-325 MG TAB
5-325 mg | ORAL_TABLET | ORAL | 0 refills | Status: DC | PRN
Start: 2015-04-22 — End: 2015-06-07

## 2015-04-22 NOTE — Progress Notes (Signed)
HISTORY OF PRESENT ILLNESS  DIETRICH KE is a 30 y.o. female who presents for evaluation of a ventral hernia.   HPI Comments: Ms. Otwell was recently admitted to [facility] with abdominal pain. CT Scan of the abdomen/pelvis was significant for an anterior abdominal wall, fat containing hernia. There was slight anteriorly adjacent stranding. Also noted was hepatosplenomegaly. No other acute abnormalities were noted. EGD during her admission revealed a hiatal hernia and mild gastritis. Doing fairly wells since discharge. No significant improvement in abdominal pain.    Past Medical History:  04/12/2015: Anxiety and depression  04/12/2015: Bipolar affective disorder, currently depresse*  No date: Cancer (HCC)  No date: Depression  No date: Diabetes (HCC)  04/12/2015: Diabetes mellitus due to underlying condition,*  04/12/2015: Encounter for long-term current use of high ri*  04/12/2015: Encounter for long-term current use of high ri*  04/09/2015: Essential hypertension  04/12/2015: Gastroesophageal reflux disease without esopha*  04/12/2015: Hiatal hernia  04/12/2015: History of cholecystectomy  04/12/2015: Hospital discharge follow-up  No date: Hypertension  04/12/2015: 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    Tobacco - Denies.    EtOH - Denies.      Review of systems negative except as noted.      Review of Systems   Constitutional: Negative for chills and fever.    Gastrointestinal: Positive for abdominal pain, heartburn and nausea. Negative for constipation and vomiting.       Physical Exam   Constitutional: No distress.   Obese female.   HENT:   Head: Normocephalic and atraumatic.   Eyes: No scleral icterus.   Neck: Neck supple.   Cardiovascular: Normal rate and regular rhythm.    Pulmonary/Chest: Effort normal and breath sounds normal.   Abdominal: Soft. She exhibits no distension. There is no tenderness. There is no rebound and no guarding.   Musculoskeletal: Normal range of motion.   Lymphadenopathy:     She has no cervical adenopathy.   Neurological: She is alert.   Vitals reviewed.      ASSESSMENT and PLAN  Reviewed CT Scan.  In view of the findings on H and P and CT scan, Ms. Uher may benefit from repair of the ventral hernia. Discussed ventral hernia, possibly with mesh, with her including risks of bleeding, infection, recurrence. She understands and wishes to proceed. I have tentatively  scheduled Ms. Leavy CellaBoyd for surgery on April 29, [facility] and will see her back in the office postoperatively. She is agreeable to this plan of action and is most certainly free to contact the office should any questions or concerns arise.         CC: Lannie FieldsNguyen Ngtam, MD    Charolette ForwardHorace Jackson, MD

## 2015-04-22 NOTE — Progress Notes (Signed)
1. Have you been to the ER, urgent care clinic since your last visit?  Hospitalized since your last visit?Yes 04/08/15 [facility] ER abd pain    2. Have you seen or consulted any other health care providers outside of the [facility] since your last visit?  Include any pap smears or colon screening. No

## 2015-04-23 NOTE — Telephone Encounter (Signed)
Pls call [facility] at [phone]    Re; Seroquel     States they sent a fax yesterday, needs enough for 30 day supply

## 2015-04-23 NOTE — Telephone Encounter (Signed)
Referral processed for Dr. Evelene CroonSantos. Referral faxed to specialty office and mailed to pt.

## 2015-04-26 ENCOUNTER — Inpatient Hospital Stay: Admit: 2015-04-26 | Payer: MEDICARE | Primary: Family Medicine

## 2015-04-26 ENCOUNTER — Encounter

## 2015-04-26 MED ORDER — QUETIAPINE SR 400 MG 24 HR TAB
400 mg | ORAL_TABLET | Freq: Every evening | ORAL | 2 refills | Status: AC
Start: 2015-04-26 — End: ?

## 2015-04-26 NOTE — Telephone Encounter (Signed)
-----   Message from Harold BarbanYvonne D Dabney sent at 04/26/2015 11:56 AM EDT -----  Regarding: Dr. Margorie JohnNgtam/Refill  Pt stated she takes her medication(Seroquel) 2 at bedtime as prescribed, and she was only given a quantity of 30 and will be out on Tuesday, and [facility] requested a refill on last Wednesday and no response from the doctor.  Best contact number [phone].

## 2015-04-26 NOTE — Other (Signed)
[facility]  Preoperative Instructions      Surgery Date 04/29/15        Time of Arrival  0800    1. On the day of your surgery, please report to the [facility] Entrance. If arriving prior to 7 AM please enter through the Emergency Room Entrance.    2. You must have a responsible adult to drive you to the hospital, stay at the hospital during your surgery and drive you home. You should have someone stay with you for the first 24 hours after your surgery.You should not drive a car for 24 hours following surgery.    3. Do not have anything to eat or drink ( including water, gum, mints, coffee, juice) after midnight.This may not apply to medications prescribed by your physician.  Please note special instructions, if applicable.  If you are currently taking Plavix, Coumadin, or other blood-thinning agents, contact your surgeon for instructions.    4. We recommend you do not drink any alcoholic beverages for 24 hours before and after your surgery.    5. Have a list of all current medications, including vitamins, herbal supplements and any other over the counter medications.  Stop Asprin and non-steroidal anti-inflammatory drugs (I.e. Advil, Aleve) as directed by your surgeon's office. Stop all vitamins and herbal supplements seven days prior to your surgery.    6. Wear comfortable clothes.  Wear glasses instead of contacts. Do not bring any money or jewelry.  Do not wear make-up, particularly mascara, the morning of your surgery.  Do not wear nail polish, particularly if you are having foot /hand surgery.  Wear your hair loose or down, no ponytails, buns, bobby pins or clips.  All body piercings must be removed.  Please shower before surgery with an antibacterial soap (Safeguard/Dial) and use a clean towel. Do not apply any lotions, powders, cologne, perfume or deodorants afterward.    Do not shave the area around your surgical incision for at least two to three days prior to your surgery.     If you wear glasses, contacts, dentures and/or hearing aids, they will be removed prior to surgery.    7. You should understand that if you do not follow these instructions your surgery may be cancelled.  If your physical condition changes (I.e. fever, cold or flu) please contact your surgeon as soon as possible.    8. It is important that you be on time.  If a situation occurs where you may be late, please call [phone]    9. If you are feeling sick before surgery, call your surgeon. He or she will tell you what to do. If you are sick on the day of your surgery please call [phone].     10. If you have any questions and or problems, please call [phone] (Pre-admission Testing).    11. Your surgery time may be subject to change.  You will receive a phone call the evening before your surgery if your time changes.           Special Instructions: please eat healthy diet and watch blood sugar!    MEDICATIONS TO TAKE THE MORNING OF SURGERY WITH A SIP OF WATER: clonidine    I understand a pre-operative phone call will be made to verify my surgery time.  In the event that I am not available, I give permission for a message to be left on my answering service and/or with another person?   YES  ___________________      ___________________  _________  (Signature of Patient)          (Witness)                              (Date and Time)

## 2015-04-26 NOTE — Telephone Encounter (Signed)
Returned call to patient told her Dr. Lenna SciaraNgtam changed the order this am.

## 2015-04-26 NOTE — Other (Signed)
Patient only has father that can bring her to surgery. She is from out of town and her mother does not leave the house. Spoke with patient and her father, Thursday is a day that he is unable to leave his work to come back and get her until the afternoon. Explained that he needs to be here and be ready to leave when surgery is finished. Will discuss with Dr. Noel Geroldohen and see if another time/day might possible work due to transportation issues.

## 2015-04-29 NOTE — Other (Signed)
Pre-operative instructions reviewed and patient verbalizes understanding of instructions. Patient has been given the opportunity to ask additional questions.

## 2015-04-29 NOTE — Other (Signed)
ONLY ABLE TO COMPLETE HISTORY TO PATIENT INFORMATION DUE TO PATIENT NEEDEING TO RETURN TO WORK.  PATEINT DOES NOT HAVE LIST OF MEDICATION WITH DOSES SINCE AT WORK.

## 2015-04-30 ENCOUNTER — Inpatient Hospital Stay: Payer: MEDICARE

## 2015-04-30 LAB — GLUCOSE, POC
Glucose (POC): 315 mg/dL — ABNORMAL HIGH (ref 65–100)
Glucose (POC): 282 mg/dL — ABNORMAL HIGH (ref 65–100)

## 2015-04-30 LAB — HCG URINE, QL. - POC: Pregnancy test,urine (POC): NEGATIVE

## 2015-04-30 MED ORDER — MIDAZOLAM 1 MG/ML IJ SOLN
1 mg/mL | INTRAMUSCULAR | Status: DC | PRN
Start: 2015-04-30 — End: 2015-04-30

## 2015-04-30 MED ORDER — SODIUM CHLORIDE 0.9 % IJ SYRG
INTRAMUSCULAR | Status: DC | PRN
Start: 2015-04-30 — End: 2015-04-30

## 2015-04-30 MED ORDER — LACTATED RINGERS IV
INTRAVENOUS | Status: DC
Start: 2015-04-30 — End: 2015-04-30

## 2015-04-30 MED ORDER — LACTATED RINGERS IV
INTRAVENOUS | Status: DC | PRN
Start: 2015-04-30 — End: 2015-04-30
  Administered 2015-04-30: 13:00:00 via INTRAVENOUS

## 2015-04-30 MED ORDER — BACITRACIN 50,000 UNIT IM
50000 unit | INTRAMUSCULAR | Status: DC | PRN
Start: 2015-04-30 — End: 2015-04-30
  Administered 2015-04-30: 14:00:00

## 2015-04-30 MED ORDER — FENTANYL CITRATE (PF) 50 MCG/ML IJ SOLN
50 mcg/mL | INTRAMUSCULAR | Status: AC | PRN
Start: 2015-04-30 — End: 2015-04-30
  Administered 2015-04-30 (×4): via INTRAVENOUS

## 2015-04-30 MED ORDER — PROPOFOL 10 MG/ML IV EMUL
10 mg/mL | INTRAVENOUS | Status: DC | PRN
Start: 2015-04-30 — End: 2015-04-30
  Administered 2015-04-30 (×5): via INTRAVENOUS

## 2015-04-30 MED ORDER — NEOSTIGMINE METHYLSULFATE 1 MG/ML INJECTION
1 mg/mL | INTRAMUSCULAR | Status: DC | PRN
Start: 2015-04-30 — End: 2015-04-30
  Administered 2015-04-30: 15:00:00 via INTRAVENOUS

## 2015-04-30 MED ORDER — MIDAZOLAM 1 MG/ML IJ SOLN
1 mg/mL | INTRAMUSCULAR | Status: AC
Start: 2015-04-30 — End: ?

## 2015-04-30 MED ORDER — CEFAZOLIN 2 GRAM/50 ML NS IVPB
INTRAVENOUS | Status: DC
Start: 2015-04-30 — End: 2015-04-30

## 2015-04-30 MED ORDER — ROCURONIUM 10 MG/ML IV
10 mg/mL | INTRAVENOUS | Status: DC | PRN
Start: 2015-04-30 — End: 2015-04-30
  Administered 2015-04-30 (×3): via INTRAVENOUS

## 2015-04-30 MED ORDER — FENTANYL CITRATE (PF) 50 MCG/ML IJ SOLN
50 mcg/mL | INTRAMUSCULAR | Status: DC | PRN
Start: 2015-04-30 — End: 2015-04-30
  Administered 2015-04-30 (×5): via INTRAVENOUS

## 2015-04-30 MED ORDER — HYDROMORPHONE (PF) 1 MG/ML IJ SOLN
1 mg/mL | INTRAMUSCULAR | Status: DC | PRN
Start: 2015-04-30 — End: 2015-04-30
  Administered 2015-04-30: 15:00:00 via INTRAVENOUS

## 2015-04-30 MED ORDER — LABETALOL 5 MG/ML IV SOLN
5 mg/mL | INTRAVENOUS | Status: DC | PRN
Start: 2015-04-30 — End: 2015-04-30
  Administered 2015-04-30: 15:00:00 via INTRAVENOUS

## 2015-04-30 MED ORDER — FENTANYL CITRATE (PF) 50 MCG/ML IJ SOLN
50 mcg/mL | INTRAMUSCULAR | Status: AC
Start: 2015-04-30 — End: ?

## 2015-04-30 MED ORDER — ONDANSETRON (PF) 4 MG/2 ML INJECTION
4 mg/2 mL | INTRAMUSCULAR | Status: DC | PRN
Start: 2015-04-30 — End: 2015-04-30

## 2015-04-30 MED ORDER — HYDROMORPHONE (PF) 1 MG/ML IJ SOLN
1 mg/mL | INTRAMUSCULAR | Status: DC | PRN
Start: 2015-04-30 — End: 2015-04-30

## 2015-04-30 MED ORDER — LIDOCAINE (PF) 20 MG/ML (2 %) IJ SOLN
20 mg/mL (2 %) | INTRAMUSCULAR | Status: DC | PRN
Start: 2015-04-30 — End: 2015-04-30
  Administered 2015-04-30: 14:00:00 via INTRAVENOUS

## 2015-04-30 MED ORDER — INSULIN REGULAR HUMAN 100 UNIT/ML INJECTION
100 unit/mL | Freq: Once | INTRAMUSCULAR | Status: AC
Start: 2015-04-30 — End: 2015-04-30
  Administered 2015-04-30: 13:00:00 via INTRAVENOUS

## 2015-04-30 MED ORDER — MEPERIDINE (PF) 25 MG/ML INJ SOLUTION
25 mg/ml | INTRAMUSCULAR | Status: AC | PRN
Start: 2015-04-30 — End: 2015-04-30
  Administered 2015-04-30 (×2): via INTRAVENOUS

## 2015-04-30 MED ORDER — SODIUM CHLORIDE 0.9 % IV
INTRAVENOUS | Status: DC
Start: 2015-04-30 — End: 2015-04-30

## 2015-04-30 MED ORDER — GLYCOPYRROLATE 0.2 MG/ML IJ SOLN
0.2 mg/mL | INTRAMUSCULAR | Status: DC | PRN
Start: 2015-04-30 — End: 2015-04-30
  Administered 2015-04-30: 15:00:00 via INTRAVENOUS

## 2015-04-30 MED ORDER — MORPHINE 10 MG/ML INJ SOLUTION
10 mg/ml | INTRAMUSCULAR | Status: DC | PRN
Start: 2015-04-30 — End: 2015-04-30
  Administered 2015-04-30 (×2): via INTRAVENOUS

## 2015-04-30 MED ORDER — SODIUM CHLORIDE 0.9 % IJ SYRG
Freq: Three times a day (TID) | INTRAMUSCULAR | Status: DC
Start: 2015-04-30 — End: 2015-04-30

## 2015-04-30 MED ORDER — OXYCODONE-ACETAMINOPHEN 5 MG-325 MG TAB
5-325 mg | ORAL | Status: DC | PRN
Start: 2015-04-30 — End: 2015-04-30

## 2015-04-30 MED ORDER — EPHEDRINE SULFATE 50 MG/ML IJ SOLN
50 mg/mL | INTRAMUSCULAR | Status: DC | PRN
Start: 2015-04-30 — End: 2015-04-30

## 2015-04-30 MED ORDER — HYDROCODONE-ACETAMINOPHEN 5 MG-325 MG TAB
5-325 mg | ORAL_TABLET | ORAL | 0 refills | Status: DC | PRN
Start: 2015-04-30 — End: 2015-06-07

## 2015-04-30 MED ORDER — ALBUMIN, HUMAN 5 % IV
5 % | INTRAVENOUS | Status: AC
Start: 2015-04-30 — End: ?

## 2015-04-30 MED ORDER — FENTANYL CITRATE (PF) 50 MCG/ML IJ SOLN
50 mcg/mL | INTRAMUSCULAR | Status: DC | PRN
Start: 2015-04-30 — End: 2015-04-30
  Administered 2015-04-30: 13:00:00 via INTRAVENOUS

## 2015-04-30 MED ORDER — SUCCINYLCHOLINE CHLORIDE 20 MG/ML INJECTION
20 mg/mL | INTRAMUSCULAR | Status: DC | PRN
Start: 2015-04-30 — End: 2015-04-30
  Administered 2015-04-30: 14:00:00 via INTRAVENOUS

## 2015-04-30 MED ORDER — ONDANSETRON (PF) 4 MG/2 ML INJECTION
4 mg/2 mL | INTRAMUSCULAR | Status: DC | PRN
Start: 2015-04-30 — End: 2015-04-30
  Administered 2015-04-30: 15:00:00 via INTRAVENOUS

## 2015-04-30 MED ORDER — SODIUM CHLORIDE 0.9 % IV
50000 unit | Freq: Once | INTRAVENOUS | Status: DC
Start: 2015-04-30 — End: 2015-04-30

## 2015-04-30 MED ORDER — MIDAZOLAM 1 MG/ML IJ SOLN
1 mg/mL | INTRAMUSCULAR | Status: DC | PRN
Start: 2015-04-30 — End: 2015-04-30
  Administered 2015-04-30: 14:00:00 via INTRAVENOUS

## 2015-04-30 MED ORDER — LIDOCAINE (PF) 10 MG/ML (1 %) IJ SOLN
10 mg/mL (1 %) | INTRAMUSCULAR | Status: DC | PRN
Start: 2015-04-30 — End: 2015-04-30

## 2015-04-30 MED ORDER — LACTATED RINGERS IV
INTRAVENOUS | Status: DC
Start: 2015-04-30 — End: 2015-04-30
  Administered 2015-04-30: 13:00:00 via INTRAVENOUS

## 2015-04-30 MED ORDER — BUPIVACAINE (PF) 0.25 % (2.5 MG/ML) IJ SOLN
0.25 % (2.5 mg/mL) | Freq: Once | INTRAMUSCULAR | Status: AC
Start: 2015-04-30 — End: 2015-04-30
  Administered 2015-04-30: 15:00:00

## 2015-04-30 MED ORDER — DIPHENHYDRAMINE HCL 50 MG/ML IJ SOLN
50 mg/mL | INTRAMUSCULAR | Status: DC | PRN
Start: 2015-04-30 — End: 2015-04-30

## 2015-04-30 MED ORDER — HYDROMORPHONE (PF) 1 MG/ML IJ SOLN
1 mg/mL | INTRAMUSCULAR | Status: AC
Start: 2015-04-30 — End: ?

## 2015-04-30 MED FILL — FENTANYL CITRATE (PF) 50 MCG/ML IJ SOLN: 50 mcg/mL | INTRAMUSCULAR | Qty: 5

## 2015-04-30 MED FILL — INSULIN REGULAR HUMAN 100 UNIT/ML INJECTION: 100 unit/mL | INTRAMUSCULAR | Qty: 1

## 2015-04-30 MED FILL — MIDAZOLAM 1 MG/ML IJ SOLN: 1 mg/mL | INTRAMUSCULAR | Qty: 2

## 2015-04-30 MED FILL — HYDROMORPHONE (PF) 1 MG/ML IJ SOLN: 1 mg/mL | INTRAMUSCULAR | Qty: 1

## 2015-04-30 MED FILL — MORPHINE 10 MG/ML SYRINGE: 10 mg/mL | INTRAMUSCULAR | Qty: 1

## 2015-04-30 MED FILL — ALBURX (HUMAN) 5 % INTRAVENOUS SOLUTION: 5 % | INTRAVENOUS | Qty: 500

## 2015-04-30 MED FILL — MEPERIDINE (PF) 25MG/ML INJECTION: 25 mg/mL | INTRAMUSCULAR | Qty: 1

## 2015-04-30 MED FILL — LACTATED RINGERS IV: INTRAVENOUS | Qty: 1000

## 2015-04-30 MED FILL — FENTANYL CITRATE (PF) 50 MCG/ML IJ SOLN: 50 mcg/mL | INTRAMUSCULAR | Qty: 2

## 2015-04-30 MED FILL — BD POSIFLUSH NORMAL SALINE 0.9 % INJECTION SYRINGE: INTRAMUSCULAR | Qty: 10

## 2015-04-30 MED FILL — CEFAZOLIN 2 GRAM/50 ML NS IVPB: INTRAVENOUS | Qty: 50

## 2015-04-30 NOTE — Anesthesia Post-Procedure Evaluation (Signed)
Post-Anesthesia Evaluation and Assessment    Patient: Amanda Henderson MRN: [id number]  SSN: [id number]    Date of Birth: 09/25/1985  Age: 30 y.o.  Sex: female       Cardiovascular Function/Vital Signs  Visit Vitals   ??? BP 135/66   ??? Pulse (!) 102   ??? Temp 37.1 ??C (98.8 ??F)   ??? Resp 16   ??? Ht 5\' 4"  (1.626 m)   ??? Wt 116.1 kg (256 lb)   ??? SpO2 96%   ??? BMI 43.94 kg/m2       Patient is status post general anesthesia for Procedure(s):  REPAIR OF VENTRAL HERNIA WITH MESH.    Nausea/Vomiting: None    Postoperative hydration reviewed and adequate.    Pain:  Pain Scale 1: Numeric (0 - 10) (04/30/15 1115)  Pain Intensity 1: 0 (04/30/15 1115)   Managed    Neurological Status:   Neuro (WDL): Within Defined Limits (04/30/15 1115)  Neuro  LLE Motor Response: Numbness (04/30/15 0816)  RLE Motor Response: Numbness (04/30/15 0816)   At baseline    Mental Status and Level of Consciousness: Arousable    Pulmonary Status:   O2 Device: Nasal cannula (04/30/15 1115)   Adequate oxygenation and airway patent    Complications related to anesthesia: None    Post-anesthesia assessment completed. No concerns    Signed By: Thane Eduonald G Shantina Chronister, MD     April 30, 2015

## 2015-04-30 NOTE — H&P (Signed)
Date of Surgery Update:  Amanda Henderson was seen and examined.  History and physical has been reviewed. The patient has been examined. There have been no significant clinical changes since the completion of the originally dated History and Physical.    Signed By: Philipp Ovensennis Jaquari Reckner, MD     April 30, 2015 9:07 AM         Please note from the office and include the additional information below:    Past Medical History  Past Medical History:   Diagnosis Date   ??? Anxiety and depression 04/12/2015   ??? Bipolar affective disorder, currently depressed, mild (HCC) 04/12/2015   ??? Cancer (HCC)     rhabdo sarcoma   ??? Chronic obstructive pulmonary disease (HCC)     states chronic bronchitis   ??? Depression    ??? Diabetes (HCC)    ??? Diabetes mellitus due to underlying condition, uncontrolled, without complication, with long-term current use of insulin (HCC) 04/12/2015   ??? Encounter for long-term current use of high risk medication 04/12/2015   ??? Encounter for long-term current use of high risk medication 04/12/2015   ??? Essential hypertension 04/09/2015   ??? Gastroesophageal reflux disease without esophagitis 04/12/2015   ??? GERD (gastroesophageal reflux disease)    ??? Hiatal hernia 04/12/2015   ??? History of cholecystectomy 04/12/2015   ??? Hospital discharge follow-up 04/12/2015   ??? Hypertension    ??? Peripheral sensory neuropathy due to type 2 diabetes mellitus (HCC) 04/12/2015   ??? Psychiatric disorder     Anxiety and Schizoaffective disorder   ??? Psychiatric disorder 04/09/2015   ??? Schizophrenia, schizo-affective type (HCC) 04/12/2015        Past Surgical History  Past Surgical History:   Procedure Laterality Date   ??? HX CHOLECYSTECTOMY     ??? HX HERNIA REPAIR      umbilical   ??? HX OTHER SURGICAL      varicose vein removal left leg        Social History  The patient Amanda Henderson  reports that she has never smoked. She has never used smokeless tobacco. She reports that she does not drink alcohol or use illicit drugs.     Family History  Family History    Problem Relation Age of Onset   ??? No Known Problems Mother    ??? No Known Problems Father    ??? No Known Problems Brother    ??? Anesth Problems Neg Hx

## 2015-04-30 NOTE — Op Note (Signed)
[facility]   [address]   [place], [place] [address]   OP NOTE       Name:  Amanda, Henderson   MR#:  [id number]   DOB:  Dec 21, 1985   Account #:  [id number]    Surgery Date:  04/30/2015   Date of Adm:  04/30/2015       PREOPERATIVE DIAGNOSIS: Ventral hernia.    POSTOPERATIVE DIAGNOSIS: Ventral hernia.    PROCEDURES PERFORMED: Repair of ventral hernia with mesh.    SURGEON: Kirkland Hun. Noel Gerold, MD    ASSISTANT: Collier Salina, RN    ANESTHESIA: General endotracheal.    ESTIMATED BLOOD LOSS: Minimal.    FLUIDS: Crystalloid 750 mL.    SPECIMENS REMOVED: Hernia sac to Pathology.    DRAINS: None.    COMPLICATIONS: None.    INDICATIONS FOR SURGERY: The patient is a 30 year old female   with a symptomatic ventral hernia. The patient was brought to the   operating room at this time for open hernia repair, possibly with mesh.   The risks of the procedure, including, but not limited to, bleeding,   infection, and hernia recurrence, were discussed in detail with the   patient. The patient understood and wished to proceed.    DESCRIPTION OF PROCEDURE: After consent was obtained, the   patient was brought to the operating room, where she was placed in a   supine position on the operating room table. Following the induction of   an adequate level of general anesthesia via the endotracheal tube,   compression devices were placed on both lower extremities. The   abdomen was prepped with ChloraPrep and draped as a sterile field.   Local anesthetic was infiltrated, and a midline incision below the   umbilicus, over the hernia defect was opened sharply. Subcutaneous   bleeders were carefully cauterized. The incision was carried down to   the hernia sac, which was readily identified and carefully opened. Upon   doing so, no incarcerated peritoneal contents were encountered. The   hernia sac was mobilized back to the edge of the fascia, where it was   excised with cautery. The specimen was passed off the field and    submitted for histopathologic evaluation.     Using a combination of blunt and sharp technique, the edges of the   fascia were mobilized such that a primary tension-free hernia repair   could be completed. Omentum that was adherent to the anterior   abdominal wall was taken down with a combination of blunt and sharp   dissection. Several bleeders were cauterized or ligated with 3-0 Vicryl   ties. The wound was then irrigated copiously with antibiotic-containing   saline, inspected, and found to be hemostatic. A medium Ventralex   patch was brought on the field, after first soaking it in antibiotic-  containing saline. The patch was positioned appropriately and the   fascia closed superiorly to inferiorly with interrupted #1 Vicryl figure-of-  eight sutures. Care was taken to incorporate the mesh with the suture.   The mesh tail was excised and the remaining leaves split. The mesh   and fascia were closed with another #1 Vicryl figure-of-eight suture.   The wound was irrigated again with antibiotic-containing saline,   inspected, and found to be hemostatic. Surgical incision was closed   with 2 layers of running 2-0 Vicryl suture, followed by 4-0 Monocryl   subcuticular suture to the skin. Additional local anesthetic was   infiltrated  and the incision dressed with Dermabond.     The patient was awakened from her general anesthetic and extubated   in the operating room. She was transferred to the stretcher and   brought to the recovery room in stable condition, having tolerated the   procedure well. At the conclusion of the procedure, all sponge counts,   instrument counts, and needle counts were reported as correct x2.        Radene OuENNIS S. Romaldo Saville, MD      [facility]   D:  04/30/2015   11:10   T:  04/30/2015   12:13   Job #:  [id number]

## 2015-04-30 NOTE — Anesthesia Pre-Procedure Evaluation (Addendum)
Anesthetic History   No history of anesthetic complications            Review of Systems / Medical History  Patient summary reviewed, nursing notes reviewed and pertinent labs reviewed    Pulmonary  Within defined limits                 Neuro/Psych   Within defined limits      Psychiatric history     Cardiovascular  Within defined limits  Hypertension                   GI/Hepatic/Renal  Within defined limits   GERD           Endo/Other  Within defined limits  Diabetes: poorly controlled    Obesity     Other Findings            Physical Exam    Airway  Mallampati: II  TM Distance: > 6 cm  Neck ROM: normal range of motion   Mouth opening: Normal     Cardiovascular  Regular rate and rhythm,  S1 and S2 normal,  no murmur, click, rub, or gallop             Dental  No notable dental hx       Pulmonary  Breath sounds clear to auscultation               Abdominal  GI exam deferred       Other Findings            Anesthetic Plan    ASA: 3  Anesthesia type: general          Induction: Intravenous  Anesthetic plan and risks discussed with: Patient

## 2015-04-30 NOTE — Other (Signed)
Patient: Amanda DredgeHeather N Henderson MRN: [id number]  SSN: [id number]   Date of Birth: 02/28/1985  Age: 30 y.o.  Sex: female     Patient is status post Procedure(s):  REPAIR OF VENTRAL HERNIA WITH MESH.    Surgeon(s) and Role:     * Philipp Ovensennis Cohen, MD - Primary    Local/Dose/Irrigation:                   Peripheral IV 04/30/15 Left Hand (Active)   Dressing Status New 04/30/2015  8:49 AM   Dressing Type Transparent 04/30/2015  8:49 AM   Hub Color/Line Status Infusing 04/30/2015  8:49 AM            Airway - Endotracheal Tube 04/30/15 (Active)                   Dressing/Packing:  Wound Abdomen Anterior-DRESSING TYPE: Topical skin adhesive/glue (04/30/15 1051)  Splint/Cast:  ]    Other:

## 2015-04-30 NOTE — Op Note (Signed)
[facility]   [address] [place] [address]   OP NOTE       Name:  Amanda Henderson, Amanda Henderson   MR#:  [id number]   DOB:  08/05/1985   Account #:  [id number]    Surgery Date:  04/30/2015   Date of Adm:  04/30/2015       PREOPERATIVE DIAGNOSIS: Ventral hernia.    POSTOPERATIVE DIAGNOSIS: Ventral hernia.    PROCEDURES PERFORMED: Repair of ventral hernia with mesh.    SURGEON: Nayelie Gionfriddo S. Sherrell Weir, MD    ASSISTANT: Christopher Bowers, RN    ANESTHESIA: General endotracheal.    ESTIMATED BLOOD LOSS: Minimal.    FLUIDS: Crystalloid 750 mL.    SPECIMENS REMOVED: Hernia sac to Pathology.    DRAINS: None.    COMPLICATIONS: None.    INDICATIONS FOR SURGERY: The patient is a 29-year-old female   with a symptomatic ventral hernia. The patient was brought to the   operating room at this time for open hernia repair, possibly with mesh.   The risks of the procedure, including, but not limited to, bleeding,   infection, and hernia recurrence, were discussed in detail with the   patient. The patient understood and wished to proceed.    DESCRIPTION OF PROCEDURE: After consent was obtained, the   patient was brought to the operating room, where she was placed in a   supine position on the operating room table. Following the induction of   an adequate level of general anesthesia via the endotracheal tube,   compression devices were placed on both lower extremities. The   abdomen was prepped with ChloraPrep and draped as a sterile field.   Local anesthetic was infiltrated, and a midline incision below the   umbilicus, over the hernia defect was opened sharply. Subcutaneous   bleeders were carefully cauterized. The incision was carried down to   the hernia sac, which was readily identified and carefully opened. Upon   doing so, no incarcerated peritoneal contents were encountered. The   hernia sac was mobilized back to the edge of the fascia, where it was   excised with cautery. The specimen was passed off the field and    submitted for histopathologic evaluation.     Using a combination of blunt and sharp technique, the edges of the   fascia were mobilized such that a primary tension-free hernia repair   could be completed. Omentum that was adherent to the anterior   abdominal wall was taken down with a combination of blunt and sharp   dissection. Several bleeders were cauterized or ligated with 3-0 Vicryl   ties. The wound was then irrigated copiously with antibiotic-containing   saline, inspected, and found to be hemostatic. A medium Ventralex   patch was brought on the field, after first soaking it in antibiotic-  containing saline. The patch was positioned appropriately and the   fascia closed superiorly to inferiorly with interrupted #1 Vicryl figure-of-  eight sutures. Care was taken to incorporate the mesh with the suture.   The mesh tail was excised and the remaining leaves split. The mesh   and fascia were closed with another #1 Vicryl figure-of-eight suture.   The wound was irrigated again with antibiotic-containing saline,   inspected, and found to be hemostatic. Surgical incision was closed   with 2 layers of running 2-0 Vicryl suture, followed by 4-0 Monocryl   subcuticular suture to the skin. Additional local anesthetic was   infiltrated   and the incision dressed with Dermabond.     The patient was awakened from her general anesthetic and extubated   in the operating room. She was transferred to the stretcher and   brought to the recovery room in stable condition, having tolerated the   procedure well. At the conclusion of the procedure, all sponge counts,   instrument counts, and needle counts were reported as correct x2.        Amanda Henderson S. [name], MD      [facility]   D:  04/30/2015   11:10   T:  04/30/2015   12:13   Job #:  [id number]

## 2015-04-30 NOTE — Brief Op Note (Signed)
BRIEF OPERATIVE NOTE    Date of Procedure: 04/30/2015   Preoperative Diagnosis:  Ventral Hernia.  Postoperative Diagnosis:  Same.    Procedure(s):   Repair Ventral Hernia with Mesh.  Surgeon(s) and Role:   Philipp Ovensennis Cathyrn Deas, MD - Primary  Surgical Staff:  Circ-1: Geanie LoganGulzighra Abdushukur  Scrub RN-1: Cherlyn CushingHeather N Nunnally, RN  Surg Asst-1: Louie Bunhristopher K Bowers, RN  Event Time In   Incision Start 1001   Incision Close 1059     Anesthesia: General   Estimated Blood Loss: Minimal.  Specimens:   ID Type Source Tests Collected by Time Destination   1 : Hernia sac Fresh Hernia Sac  Philipp Ovensennis Monigue Spraggins, MD 04/30/2015 1014 Pathology      Findings: Ventral hernia. Not incarcerated.    Complications: None.  Implants:   Implant Name Type Inv. Item Serial No. Manufacturer Lot No. LRB No. Used Action   MESH VENTRALEX ST MED --  - SNA   MESH VENTRALEX ST MED --  NA BARD DAVOL [id number] N/A 1 Implanted

## 2015-05-01 MED FILL — GLYCOPYRROLATE 0.2 MG/ML IJ SOLN: 0.2 mg/mL | INTRAMUSCULAR | Qty: 2

## 2015-05-01 MED FILL — ROCURONIUM 10 MG/ML IV: 10 mg/mL | INTRAVENOUS | Qty: 5

## 2015-05-01 MED FILL — ONDANSETRON (PF) 4 MG/2 ML INJECTION: 4 mg/2 mL | INTRAMUSCULAR | Qty: 2

## 2015-05-01 MED FILL — NEOSTIGMINE METHYLSULFATE 1 MG/ML INJECTION: 1 mg/mL | INTRAMUSCULAR | Qty: 4

## 2015-05-01 MED FILL — XYLOCAINE-MPF 20 MG/ML (2 %) INJECTION SOLUTION: 20 mg/mL (2 %) | INTRAMUSCULAR | Qty: 4

## 2015-05-01 MED FILL — DIPRIVAN 10 MG/ML INTRAVENOUS EMULSION: 10 mg/mL | INTRAVENOUS | Qty: 32

## 2015-05-01 MED FILL — QUELICIN 20 MG/ML INJECTION SOLUTION: 20 mg/mL | INTRAMUSCULAR | Qty: 7

## 2015-05-01 MED FILL — LABETALOL 5 MG/ML IV SOLN: 5 mg/mL | INTRAVENOUS | Qty: 2

## 2015-05-10 ENCOUNTER — Ambulatory Visit: Admit: 2015-05-10 | Discharge: 2015-05-10 | Payer: MEDICARE | Attending: Family Medicine | Primary: Family Medicine

## 2015-05-10 DIAGNOSIS — IMO0001 Reserved for inherently not codable concepts without codable children: Secondary | ICD-10-CM

## 2015-05-10 MED ORDER — BLOOD SUGAR DIAGNOSTIC TEST STRIPS
ORAL_STRIP | 5 refills | Status: DC
Start: 2015-05-10 — End: 2015-05-12

## 2015-05-10 MED ORDER — TOPIRAMATE 25 MG TAB
25 mg | ORAL_TABLET | Freq: Two times a day (BID) | ORAL | 2 refills | Status: AC
Start: 2015-05-10 — End: ?

## 2015-05-10 MED ORDER — INSULIN DETEMIR 100 UNIT/ML (3 ML) SUB-Q PEN
100 unit/mL (3 mL) | Freq: Every evening | SUBCUTANEOUS | 2 refills | Status: AC
Start: 2015-05-10 — End: 2015-06-09

## 2015-05-10 MED ORDER — DULAGLUTIDE 1.5 MG/0.5 ML SUBCUTANEOUS PEN INJECTOR
1.5 mg/0.5 mL | INJECTION | SUBCUTANEOUS | 5 refills | Status: AC
Start: 2015-05-10 — End: ?

## 2015-05-10 MED ORDER — DULAGLUTIDE 0.75 MG/0.5 ML SUBCUTANEOUS PEN INJECTOR
0.75 mg/0.5 mL | INJECTION | SUBCUTANEOUS | 4 refills | Status: DC
Start: 2015-05-10 — End: 2015-06-07

## 2015-05-10 NOTE — Progress Notes (Signed)
Amanda Henderson is a 30 y.o. female,     2nd visit with this physician.     Multiples psych concerns.  Currently stable, but i had referred her to psych for management and f/u.  She have called around and noone takes her insurance.  To call her insurance for referral.     DM2: A1C >10.  Uncontrolled DM2.  She report checking her BG regularly BID.  Bought her glucometer.  Before bed in the 400s, before breakfast 200s-300s.  Will increase her Levemir 30u-->35u.  She rarely use her short acting insulin, maybe 4X per week.    Unable to tolerate metformin and glyburide, glipizide.    Will add on trulicity for weight loss benefits.     Morbid obesity BMI 43. Difficulty with appetite control.  Will use topamax 25mg  BID, may increase on own to 50mg  BID.     Hyperlipidemia: continue tricor, check lab.     Reviewed and update: active problem list, medication list, allergies, family history, social history    Pertinent items are noted in HPI.    Allergies   Allergen Reactions   ??? Geodon [Ziprasidone Hcl] Other (comments)     " mouth lock up"   ??? Glipizide Nausea and Vomiting   ??? Glyburide Nausea and Vomiting   ??? Haldol [Haloperidol Lactate] Other (comments)     " mouth lock up".    ??? Ibuprofen Swelling     Throat swelling   ??? Metformin Diarrhea   ??? Toradol [Ketorolac] Swelling     Throat Swelling     Current Outpatient Prescriptions on File Prior to Visit   Medication Sig Dispense Refill   ??? HYDROcodone-acetaminophen (NORCO) 5-325 mg per tablet Take 1 Tab by mouth every four (4) hours as needed for Pain. Max Daily Amount: 6 Tabs. 15 Tab 0   ??? QUEtiapine SR (SEROQUEL XR) 400 mg sr tablet Take 2 Tabs by mouth nightly. 60 Tab 2   ??? HYDROcodone-acetaminophen (NORCO) 5-325 mg per tablet Take 1 Tab by mouth every four (4) hours as needed for Pain. Max Daily Amount: 6 Tabs. 10 Tab 0   ??? gabapentin (NEURONTIN) 600 mg tablet Take 1 Tab by mouth three (3) times daily. 90 Tab 2    ??? cloNIDine HCl (CATAPRES) 0.1 mg tablet Take 1 Tab by mouth two (2) times a day. 60 Tab 2   ??? traZODone (DESYREL) 50 mg tablet Take 1 Tab by mouth nightly. 30 Tab 2   ??? busPIRone (BUSPAR) 15 mg tablet Take 1 Tab by mouth three (3) times daily. 90 Tab 2   ??? sertraline (ZOLOFT) 100 mg tablet Take 1 Tab by mouth two (2) times a day. 60 Tab 2   ??? fenofibrate nanocrystallized (TRICOR) 145 mg tablet Take 1 Tab by mouth daily. 30 Tab 2   ??? lisinopril-hydroCHLOROthiazide (PRINZIDE, ZESTORETIC) 20-12.5 mg per tablet Take 1 Tab by mouth daily. 30 Tab 2   ??? insulin aspart (NOVOLOG) 100 unit/mL injection SLIDING SCALE 10 mL 2   ??? Insulin Needles, Disposable, 31 gauge x 5/16" ndle For flexpen.  Per patient's choice 1 Package 11   ??? pantoprazole (PROTONIX) 40 mg tablet Take 40 mg by mouth daily.       No current facility-administered medications on file prior to visit.      Patient Active Problem List   Diagnosis Code   ??? Generalized abdominal pain R10.84   ??? Ventral hernia without obstruction or gangrene K43.9   ???  Long-term insulin use in type 2 diabetes (HCC) E11.9, Z79.4   ??? BMI 40.0-44.9, adult (HCC) Z68.41   ??? Essential hypertension I10   ??? Psychiatric disorder F99   ??? Encounter for long-term current use of high risk medication Z79.899   ??? History of cholecystectomy Z90.49   ??? Gastroesophageal reflux disease without esophagitis K21.9   ??? Diabetes mellitus due to underlying condition, uncontrolled, without complication, with long-term current use of insulin (HCC) E08.9, Z79.4, E08.65   ??? Hiatal hernia K44.9   ??? Anxiety and depression F41.9, F32.9   ??? Bipolar affective disorder, currently depressed, mild (HCC) F31.31   ??? 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 Mixed hyperlipidemia E78.2       Visit Vitals   ??? BP (!) 123/93 (BP 1 Location: Right arm, BP Patient Position: Sitting)   ??? Pulse (!) 104   ??? Temp 98 ??F (36.7 ??C) (Oral)    ??? Resp 18   ??? Ht  (1.626 m)   ??? Wt 256 lb (116.1 kg)   ??? LMP 04/24/2015   ??? SpO2 96%   ??? BMI 43.94 kg/m2     General appearance: alert, cooperative, no distress, appears stated age, morbidly obese  Head: Normocephalic, without obvious abnormality, atraumatic  Eyes: conjunctivae/corneas clear. PERRL, EOM's intact.  Neck: supple, symmetrical, trachea midline and no JVD  Lungs: clear to auscultation bilaterally  Heart: regular rate and rhythm, S1, S2 normal, no murmur, click, rub or gallop  Abdomen: soft, non-tender. protruded  Extremities: extremities normal, atraumatic, no cyanosis or edema  Neurologic: Grossly normal      Assessment/Plans:    Amanda Henderson was seen today for diabetes and weight management.    Diagnoses and all orders for this visit:    Diabetes mellitus due to underlying condition, uncontrolled, without complication, with long-term current use of insulin (HCC)  -     TSH RFX ON ABNORMAL TO FREE T4  -     insulin detemir (LEVEMIR FLEXPEN) 100 unit/mL (3 mL) inpn; 35 Units by SubCUTAneous route nightly for 30 days.  -     glucose blood VI test strips (ASCENSIA AUTODISC VI, ONE TOUCH ULTRA TEST VI) strip; Test strips per patient choice.  Check twice a day  -     dulaglutide (TRULICITY) 0.75 mg/0.5 mL sub-q pen; 0.5 mL by SubCUTAneous route every seven (7) days.  -     dulaglutide (TRULICITY) 1.5 mg/0.5 mL sub-q pen; 0.5 mL by SubCUTAneous route every seven (7) days.  -     topiramate (TOPAMAX) 25 mg tablet; Take 1 Tab by mouth two (2) times daily (with meals).  -     LIPID PANEL  -     MICROALBUMIN, UR, RAND W/ MICROALBUMIN/CREA RATIO    Weight loss counseling, encounter for  -     TSH RFX ON ABNORMAL TO FREE T4  -     insulin detemir (LEVEMIR FLEXPEN) 100 unit/mL (3 mL) inpn; 35 Units by SubCUTAneous route nightly for 30 days.  -     glucose blood VI test strips (ASCENSIA AUTODISC VI, ONE TOUCH ULTRA TEST VI) strip; Test strips per patient choice.  Check twice a day   -     dulaglutide (TRULICITY) 0.75 mg/0.5 mL sub-q pen; 0.5 mL by SubCUTAneous route every seven (7) days.  -     dulaglutide (TRULICITY) 1.5 mg/0.5 mL sub-q pen; 0.5 mL  by SubCUTAneous route every seven (7) days.  -     topiramate (TOPAMAX) 25 mg tablet; Take 1 Tab by mouth two (2) times daily (with meals).  -     LIPID PANEL  -     MICROALBUMIN, UR, RAND W/ MICROALBUMIN/CREA RATIO    BMI 40.0-44.9, adult (HCC)  -     TSH RFX ON ABNORMAL TO FREE T4  -     insulin detemir (LEVEMIR FLEXPEN) 100 unit/mL (3 mL) inpn; 35 Units by SubCUTAneous route nightly for 30 days.  -     glucose blood VI test strips (ASCENSIA AUTODISC VI, ONE TOUCH ULTRA TEST VI) strip; Test strips per patient choice.  Check twice a day  -     dulaglutide (TRULICITY) 0.75 mg/0.5 mL sub-q pen; 0.5 mL by SubCUTAneous route every seven (7) days.  -     dulaglutide (TRULICITY) 1.5 mg/0.5 mL sub-q pen; 0.5 mL by SubCUTAneous route every seven (7) days.  -     topiramate (TOPAMAX) 25 mg tablet; Take 1 Tab by mouth two (2) times daily (with meals).  -     LIPID PANEL  -     MICROALBUMIN, UR, RAND W/ MICROALBUMIN/CREA RATIO    Essential hypertension  -     TSH RFX ON ABNORMAL TO FREE T4  -     insulin detemir (LEVEMIR FLEXPEN) 100 unit/mL (3 mL) inpn; 35 Units by SubCUTAneous route nightly for 30 days.  -     glucose blood VI test strips (ASCENSIA AUTODISC VI, ONE TOUCH ULTRA TEST VI) strip; Test strips per patient choice.  Check twice a day  -     dulaglutide (TRULICITY) 0.75 mg/0.5 mL sub-q pen; 0.5 mL by SubCUTAneous route every seven (7) days.  -     dulaglutide (TRULICITY) 1.5 mg/0.5 mL sub-q pen; 0.5 mL by SubCUTAneous route every seven (7) days.  -     topiramate (TOPAMAX) 25 mg tablet; Take 1 Tab by mouth two (2) times daily (with meals).  -     LIPID PANEL  -     MICROALBUMIN, UR, RAND W/ MICROALBUMIN/CREA RATIO    Encounter for long-term current use of high risk medication  -     TSH RFX ON ABNORMAL TO FREE T4   -     insulin detemir (LEVEMIR FLEXPEN) 100 unit/mL (3 mL) inpn; 35 Units by SubCUTAneous route nightly for 30 days.  -     glucose blood VI test strips (ASCENSIA AUTODISC VI, ONE TOUCH ULTRA TEST VI) strip; Test strips per patient choice.  Check twice a day  -     dulaglutide (TRULICITY) 0.75 mg/0.5 mL sub-q pen; 0.5 mL by SubCUTAneous route every seven (7) days.  -     dulaglutide (TRULICITY) 1.5 mg/0.5 mL sub-q pen; 0.5 mL by SubCUTAneous route every seven (7) days.  -     topiramate (TOPAMAX) 25 mg tablet; Take 1 Tab by mouth two (2) times daily (with meals).  -     LIPID PANEL  -     MICROALBUMIN, UR, RAND W/ MICROALBUMIN/CREA RATIO    Mixed hyperlipidemia  -     TSH RFX ON ABNORMAL TO FREE T4  -     insulin detemir (LEVEMIR FLEXPEN) 100 unit/mL (3 mL) inpn; 35 Units by SubCUTAneous route nightly for 30 days.  -     glucose blood VI test strips (ASCENSIA AUTODISC VI, ONE TOUCH ULTRA TEST VI) strip; Test strips per patient choice.  Check twice a day  -     dulaglutide (TRULICITY) 0.75 mg/0.5 mL sub-q pen; 0.5 mL by SubCUTAneous route every seven (7) days.  -     dulaglutide (TRULICITY) 1.5 mg/0.5 mL sub-q pen; 0.5 mL by SubCUTAneous route every seven (7) days.  -     topiramate (TOPAMAX) 25 mg tablet; Take 1 Tab by mouth two (2) times daily (with meals).  -     LIPID PANEL  -     MICROALBUMIN, UR, RAND W/ MICROALBUMIN/CREA RATIO    Discussed plans, risk/benefits of treatments/observations.   Through the use of shared decision making, above plans were agreed upon.   Medication compliance advised.  Patient verbalized understanding.     Follow-up Disposition:  Return in about 2 months (around 07/10/2015) for annual exam, dm2, weight loss, htn.      Lannie Fields, MD  05/10/2015

## 2015-05-10 NOTE — Progress Notes (Signed)
Chief Complaint   Patient presents with   ??? Annual Exam     had hernia repair surgery last Friday says it fells like a knot in the incision

## 2015-05-11 ENCOUNTER — Encounter: Attending: Internal Medicine | Primary: Family Medicine

## 2015-05-12 ENCOUNTER — Encounter

## 2015-05-12 MED ORDER — BLOOD SUGAR DIAGNOSTIC TEST STRIPS
ORAL_STRIP | 5 refills | Status: AC
Start: 2015-05-12 — End: ?

## 2015-05-13 ENCOUNTER — Encounter: Attending: Surgery | Primary: Family Medicine

## 2015-05-13 NOTE — Telephone Encounter (Signed)
no show appt 05/13/15. Called pt to reschedule appt no answer did not leave a message

## 2015-06-07 ENCOUNTER — Emergency Department: Admit: 2015-06-08 | Payer: MEDICARE | Primary: Family Medicine

## 2015-06-07 ENCOUNTER — Inpatient Hospital Stay
Admit: 2015-06-07 | Discharge: 2015-06-08 | Disposition: A | Discharge status: Home / Self Care | Payer: MEDICARE | Attending: Emergency Medicine

## 2015-06-07 DIAGNOSIS — R0789 Other chest pain: Secondary | ICD-10-CM

## 2015-06-07 NOTE — ED Notes (Signed)
Patient given copy of dc instructions and 1 scripts(s). Patient(s) verbalized understanding of instructions and script (s). Patient given a current medication reconciliation form verbalized and understanding of their medications. Patient verbalized understanding of the importance of discussing medications with his or her physician or clinic they will be following up with. Patient alert and oriented and in in no acute distress. Patient discharged home ambulatory.    1 prescription sent in to patient's requested pharmacy.

## 2015-06-07 NOTE — ED Notes (Signed)
I have reviewed & I agree with the nurses assessment.

## 2015-06-07 NOTE — ED Notes (Signed)
Peripheral IV attempted twice without success. 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 Patient reports being a really hard stick. ED tech will attempt IV stick.

## 2015-06-07 NOTE — ED Notes (Signed)
Patient has been instructed that they have been given Morphine 2mg IV* which contains opioids, benzodiazepines, or other sedating drugs. Patient is aware that they  will need to refrain from driving or operating heavy machinery after taking this medication.  Patient also instructed that they need to avoid drinking alcohol and using other products containing opioids, benzodiazepines, or other sedating drugs.  Patient verbalized understanding.

## 2015-06-07 NOTE — ED Notes (Signed)
Emergency Department Nursing Plan of Care       The Nursing Plan of Care is developed from the Nursing assessment and Emergency Department Attending provider initial evaluation.  The plan of care may be reviewed in the ???ED Provider note???.    The Plan of Care was developed with the following considerations:   Patient / Family readiness to learn indicated [id number]:verbalized understanding  Persons(s) to be included in education: patient  Barriers to Learning/Limitations:No    Signed     Rogers BlockerLashan L Holman, RN    06/07/2015   7:30 PM

## 2015-06-07 NOTE — ED Provider Notes (Signed)
HPI Comments: Amanda Henderson is a 30 y.o. female with PMhx significant for HTN, DM, GERD, COPD, and rhabdomyosarcoma who presents ambulatory to the ED with cc of 8/10 intermittent sharp left-sided CP which radiated to her left arm which started earlier today. She also c/o associated 2 areas of swelling to her left posterior head, left-sided weakness, dizziness, and SOB which is exacerbated with standing. She reports that her CP lasts x ~30 minutes and resolves on its own. The pt states that her associated symptoms started 3 days ago and notes that they may be caused by the 2 "knots" in her head. She reports that she had an appointment with Dr. Patrecia Pour last month. She also notes that she had a recent operation on her abdomen for hiatal hernia. 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 She also reports that she has not done any recent strenuous activities. Per pt, she is not currently experiencing CP in the ED. She denies h/o current symptoms, and she specifically denies cough at this time.         SHx: -tobacco, -EtOH, -illicit drug use    PCP: Sherrilyn Rist, MD    There are no other complaints, changes or physical findings at this time.    The history is provided by the patient. No language interpreter was used.        Past Medical History:   Diagnosis Date   ??? Anxiety and depression 04/12/2015   ??? Bipolar affective disorder, currently depressed, mild ([place]) 04/12/2015   ??? Cancer ([place])     rhabdo sarcoma   ??? Chronic obstructive pulmonary disease (HCC)     states chronic bronchitis   ??? Depression    ??? Diabetes ([place])    ??? Diabetes mellitus due to underlying condition, uncontrolled, without complication, with long-term current use of insulin ([place]) 04/12/2015   ??? Encounter for long-term current use of high risk medication 04/12/2015   ??? Encounter for long-term current use of high risk medication 04/12/2015   ??? Essential hypertension 04/09/2015    ??? Gastroesophageal reflux disease without esophagitis 04/12/2015   ??? GERD (gastroesophageal reflux disease)    ??? Hiatal hernia 04/12/2015   ??? History of cholecystectomy 04/12/2015   ??? Hospital discharge follow-up 04/12/2015   ??? Hypertension    ??? 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 Psychiatric disorder     Anxiety and Schizoaffective disorder   ??? Psychiatric disorder 04/09/2015   ??? Schizophrenia, schizo-affective type ([place]) 04/12/2015       Past Surgical History:   Procedure Laterality Date   ??? HX CHOLECYSTECTOMY     ??? HX HERNIA REPAIR      umbilical   ??? HX OTHER SURGICAL      varicose vein removal left leg         Family History:   Problem Relation Age of Onset   ??? No Known Problems Mother    ??? No Known Problems Father    ??? No Known Problems Brother    ??? Anesth Problems Neg Hx        Social History     Social History   ??? Marital status: SINGLE     Spouse name: N/A   ??? Number of children: N/A   ??? Years of education: N/A     Occupational History   ??? Not on file.     Social History Main Topics   ??? Smoking status: Never  Smoker   ??? Smokeless tobacco: Never Used   ??? Alcohol use No   ??? Drug use: No   ??? Sexual activity: No     Other Topics Concern   ??? Not on file     Social History Narrative         ALLERGIES: Compazine [prochlorperazine edisylate]; Geodon [ziprasidone hcl]; Glipizide; Glyburide; Haldol [haloperidol lactate]; Ibuprofen; Metformin; and Toradol [ketorolac]    Review of Systems   Constitutional: Negative for chills and fever.   HENT: Negative for congestion, rhinorrhea, sneezing and sore throat.    Eyes: Negative for redness and visual disturbance.   Respiratory: Positive for shortness of breath. Negative for cough.    Cardiovascular: Positive for chest pain (left). Negative for leg swelling.   Gastrointestinal: Negative for abdominal pain, nausea and vomiting.   Genitourinary: Negative for difficulty urinating and frequency.    Musculoskeletal: Negative for back pain, myalgias and neck stiffness.   Skin: Negative for rash.        (+) swelling to left posterior head   Neurological: Positive for dizziness and weakness (left). Negative for syncope and headaches.   Hematological: Negative for adenopathy.       Patient Vitals for the past 12 hrs:   Temp Pulse Resp BP SpO2   06/07/15 1918 98.7 ??F (37.1 ??C) (!) 120 24 (!) 152/97 97 %            Physical Exam   Constitutional: She is oriented to person, place, and time. She appears well-developed and well-nourished.   Obese female.   HENT:   Head: Normocephalic and atraumatic.   Mouth/Throat: Oropharynx is clear and moist.   2 small nodules on left posterior scalp noted.   Eyes: Conjunctivae and EOM are normal.   Neck: Normal range of motion and full passive range of motion without pain. Neck supple.   Cardiovascular: Regular rhythm, S1 normal, S2 normal, normal heart sounds, intact distal pulses and normal pulses.  Tachycardia present.    No murmur heard.  Pulmonary/Chest: Effort normal and breath sounds normal. No respiratory distress. She has no wheezes.   Tender to palpation of left anterior chest.   Abdominal: Soft. Normal appearance and bowel sounds are normal. She exhibits no distension. There is no tenderness. There is no rebound.   Musculoskeletal: Normal range of motion.   Neurological: She is alert and oriented to person, place, and time. She has normal strength.   Skin: Skin is warm, dry and intact. No rash noted.   Psychiatric: She has a normal mood and affect. Her speech is normal and behavior is normal. Judgment and thought content normal.   Nursing note and vitals reviewed.       MDM  Number of Diagnoses or Management Options  Chest pain, unspecified type:   Diagnosis management comments:   DDx: ACS, PE, skeletal strain, recurrence of cancer.       Amount and/or Complexity of Data Reviewed  Clinical lab tests: ordered and reviewed   Tests in the radiology section of CPT??: ordered and reviewed  Tests in the medicine section of CPT??: ordered and reviewed  Review and summarize past medical records: yes  Independent visualization of images, tracings, or specimens: yes    Patient Progress  Patient progress: stable    ED Course       Procedures     ED EKG interpretation: 19:45  Rhythm: sinus tachycardia; and regular . Rate (approx.): 114; Axis: normal; P wave: normal; QRS interval: normal ;  ST/T wave: normal; Other findings: borderline ekg. This EKG was interpreted by Luther Hearing, MD,ED Provider.    Procedure Note- Peripheral IV Access  9:07 PM  Performed by: Luther Hearing, MD  Luther Hearing, MD gained IV access using  20 gauge needle because the patient had no vascular access.  After cleaning the site with alcohol prep, the Right AC vein was localized with ultrasound guidance in an anterior approach.  Line confirmation was obtained by direct visualization and good blood return. No anaesthetic was used.  The line was successfully flushed with normal saline and was secured with transparent tape.  Estimated blood loss: minimal  The procedure took 1-15 minutes, and pt tolerated well.  Written by Floyde Parkins, ED Scribe, as dictated by Luther Hearing, MD.    9:55 PM  Pain improved to 5/10. Informed of negative results and need for outpatient follow up. Per nurse, pt's pain currently 2/10. Pt agrees with outpatient follow up.     9:56 PM  I have re-examined the patient and the patient denies chest pain on re-examination. The patient has had continuous chest pain for greater than 8 hours with one negative set of cardiac enzymes in the ER during this visit. The diagnosis, follow up, return instructions, test results, x-rays and medications have been discussed and reviewed with the patient. The patient has been given the opportunity to ask questions. The patient expresses understanding of the diagnosis, follow-up and return  instructions. The patient agrees to follow up with cardiology as directed and to return immediately if the chest pain worsens. The patient expresses understanding that although cardiac testing at this time is negative, a cardiac problem could still be present and that a follow-up appointment with a cardiologist for further evaluation and risk factor modification is necessary to complete the evaluation of this complaint.  Luther Hearing, MD      LABORATORY TESTS:  Recent Results (from the past 12 hour(s))   EKG, 12 LEAD, INITIAL    Collection Time: 06/07/15  7:45 PM   Result Value Ref Range    Ventricular Rate 114 BPM    Atrial Rate 114 BPM    P-R Interval 156 ms    QRS Duration 92 ms    Q-T Interval 340 ms    QTC Calculation (Bezet) 468 ms    Calculated R Axis -164 degrees    Calculated T Axis 172 degrees    Diagnosis       ** Suspect arm lead reversal, interpretation assumes no reversal  Sinus tachycardia  Inferior infarct (cited on or before 07-Jun-2015)  Anterolateral infarct (cited on or before 07-Jun-2015)  Abnormal ECG  When compared with ECG of 09-Apr-2015 16:54,  QRS axis shifted left  Serial changes of Anterior infarct present     CBC WITH AUTOMATED DIFF    Collection Time: 06/07/15  9:11 PM   Result Value Ref Range    WBC 6.0 3.6 - 11.0 K/uL    RBC 4.53 3.80 - 5.20 M/uL    HGB 11.9 11.5 - 16.0 g/dL    HCT 36.7 35.0 - 47.0 %    MCV 81.0 80.0 - 99.0 FL    MCH 26.3 26.0 - 34.0 PG    MCHC 32.4 30.0 - 36.5 g/dL    RDW 15.7 (H) 11.5 - 14.5 %    PLATELET 184 150 - 400 K/uL    NEUTROPHILS 71 32 - 75 %    LYMPHOCYTES 20 12 -  49 %    MONOCYTES 6 5 - 13 %    EOSINOPHILS 3 0 - 7 %    BASOPHILS 0 0 - 1 %    ABS. NEUTROPHILS 4.3 1.8 - 8.0 K/UL    ABS. LYMPHOCYTES 1.2 0.8 - 3.5 K/UL    ABS. MONOCYTES 0.3 0.0 - 1.0 K/UL    ABS. EOSINOPHILS 0.2 0.0 - 0.4 K/UL    ABS. BASOPHILS 0.0 0.0 - 0.1 K/UL   METABOLIC PANEL, COMPREHENSIVE    Collection Time: 06/07/15  9:11 PM   Result Value Ref Range     Sodium 138 136 - 145 mmol/L    Potassium 4.0 3.5 - 5.1 mmol/L    Chloride 101 97 - 108 mmol/L    CO2 24 21 - 32 mmol/L    Anion gap 13 5 - 15 mmol/L    Glucose 205 (H) 65 - 100 mg/dL    BUN 8 6 - 20 MG/DL    Creatinine 0.60 0.55 - 1.02 MG/DL    BUN/Creatinine ratio 13 12 - 20      GFR est AA >60 >60 ml/min/1.72m    GFR est non-AA >60 >60 ml/min/1.765m   Calcium 8.8 8.5 - 10.1 MG/DL    Bilirubin, total 0.3 0.2 - 1.0 MG/DL    ALT (SGPT) 57 12 - 78 U/L    AST (SGOT) 71 (H) 15 - 37 U/L    Alk. phosphatase 123 (H) 45 - 117 U/L    Protein, total 7.9 6.4 - 8.2 g/dL    Albumin 3.6 3.5 - 5.0 g/dL    Globulin 4.3 (H) 2.0 - 4.0 g/dL    A-G Ratio 0.8 (L) 1.1 - 2.2     CK W/ CKMB & INDEX    Collection Time: 06/07/15  9:11 PM   Result Value Ref Range    CK 56 26 - 192 U/L    CK - MB <1.0 <3.6 NG/ML    CK-MB Index Cannot be calulated 0 - 2.5     TROPONIN I    Collection Time: 06/07/15  9:11 PM   Result Value Ref Range    Troponin-I, Qt. <0.04 <0.05 ng/mL   URINALYSIS W/ REFLEX CULTURE    Collection Time: 06/07/15  9:11 PM   Result Value Ref Range    Color DARK YELLOW      Appearance CLOUDY (A) CLEAR      Specific gravity >1.030 (H) 1.003 - 1.030    pH (UA) 5.5 5.0 - 8.0      Protein 30 (A) NEG mg/dL    Glucose 250 (A) NEG mg/dL    Ketone TRACE (A) NEG mg/dL    Blood LARGE (A) NEG      Urobilinogen 0.2 0.2 - 1.0 EU/dL    Nitrites NEGATIVE  NEG      Leukocyte Esterase MODERATE (A) NEG      WBC 10-20 0 - 4 /hpf    RBC 5-10 0 - 5 /hpf    Epithelial cells MANY (A) FEW /lpf    Bacteria 2+ (A) NEG /hpf    UA:UC IF INDICATED URINE CULTURE ORDERED (A) CNI      Amorphous Crystals 2+ (A) NEG   BILIRUBIN, CONFIRM    Collection Time: 06/07/15  9:11 PM   Result Value Ref Range    Bilirubin UA, confirm NEGATIVE  NEG     POC CHEM8    Collection Time: 06/07/15  9:18 PM   Result Value Ref Range  Calcium, ionized (POC) 1.14 1.12 - 1.32 MMOL/L    Sodium (POC) 139 136 - 145 MMOL/L    Potassium (POC) 4.0 3.5 - 5.1 MMOL/L     Chloride (POC) 102 98 - 107 MMOL/L    CO2 (POC) 23 21 - 32 MMOL/L    Anion gap (POC) 20 (H) 5 - 15 mmol/L    Glucose (POC) 200 (H) 65 - 100 MG/DL    BUN (POC) 5 (L) 9 - 20 MG/DL    Creatinine (POC) 0.3 (L) 0.6 - 1.3 MG/DL    GFR-AA (POC) >60 >60 ml/min/1.33m    GFR, non-AA (POC) >60 >60 ml/min/1.741m   Hemoglobin (POC) 12.9 11.5 - 16.0 GM/DL    Hematocrit (POC) 38 35.0 - 47.0 %    Comment Comment Not Indicated.         IMAGING RESULTS:  CTA CHEST W OR W WO CONT   Final Result   EXAM: CTA CHEST W OR W WO CONT  ??  INDICATION: chest pain, SOB, tachycardic, history of bone CA, PE?  ??  COMPARISON: None.  ??  CONTRAST: 80 mL of Isovue-370.  ??  TECHNIQUE:   Precontrast scout images were obtained to localize the volume for acquisition.  Multislice helical CT arteriography was performed from the diaphragm to the  thoracic inlet during uneventful rapid bolus intravenous contrast  administration. Lung and soft tissue windows were generated. Coronal and  sagittal images were generated and 3D post processing consisting of coronal  maximum intensity images was performed. CT dose reduction was achieved through  use of a standardized protocol tailored for this examination and automatic  exposure control for dose modulation.  ??  FINDINGS:  The lungs are clear of mass, nodule, airspace disease or edema.  ??  The pulmonary arteries are well enhanced and no pulmonary emboli are identified.  ??  There is no mediastinal or hilar adenopathy or mass. The aorta enhances normally  without evidence of aneurysm or dissection.  ??  The visualized portions of the upper abdominal organs show a hypodense  appearance to liver diffusely but otherwise appear unremarkable..  ??  IMPRESSION  IMPRESSION: No pulmonary mass or other acute finding. Suspect hepatic steatosis.   CT HEAD WO CONT   Final Result   EXAM: CT HEAD WO CONT  ??  INDICATION: headache, nodules on posterior left scalp, intercranial mass?  ??  COMPARISON: None.  ??   TECHNIQUE: Unenhanced CT of the head was performed using 5 mm images. Brain and  bone windows were generated. CT dose reduction was achieved through use of a  standardized protocol tailored for this examination and automatic exposure  control for dose modulation.   ??  FINDINGS:  The ventricles and sulci are normal in size, shape and configuration and  midline. There is no significant white matter disease. There is no intracranial  hemorrhage, extra-axial collection, mass, mass effect or midline shift. The  basilar cisterns are open. No acute infarct is identified. The bone windows  demonstrate no abnormalities. The visualized portions of the paranasal sinuses  and mastoid air cells are clear. There is no clear CT correlate for left scalp  nodularity.  ??  IMPRESSION  IMPRESSION: Normal.       MEDICATIONS GIVEN:  Medications   saline peripheral flush soln 10 mL (not administered)   morphine injection 2 mg (2 mg IntraVENous Given 06/07/15 2121)   iopamidol (ISOVUE-370) 76 % injection 100 mL (100 mL IntraVENous Given 06/07/15 2208)  sodium chloride (NS) flush 10 mL (10 mL IntraVENous Given 06/07/15 2208)       IMPRESSION:  1. Chest pain, unspecified type        PLAN:  1.   Current Discharge Medication List      START taking these medications    Details   ondansetron (ZOFRAN ODT) 4 mg disintegrating tablet Take 1 Tab by mouth every eight (8) hours as needed for Nausea.  Qty: 10 Tab, Refills: 0      HYDROcodone-acetaminophen (NORCO) 5-325 mg per tablet Take 1 Tab by mouth every four (4) hours as needed for Pain. Max Daily Amount: 6 Tabs.  Qty: 6 Tab, Refills: 0           2.   Follow-up Information     Follow up With Details Comments Contact Info    Sherrilyn Rist, MD Call in 1 day  [facility]   [place] [address]  [phone]      Lawerance Bach, MD Call if your chest pain continues [address]  [place] [address]  [phone]       [facility] EMERGENCY DEPT  As needed, If symptoms worsen [place]  [phone]        Return to ED if worse       DISCHARGE NOTE:  10:57 PM  The patient has been re-evaluated and is ready for discharge. Reviewed available results with patient. Counseled patient on diagnosis and care plan. Patient has expressed understanding, and all questions have been answered. Patient agrees with plan and agrees to follow up as recommended, or return to the ED if their symptoms worsen. Discharge instructions have been provided and explained to the patient, along with reasons to return to the ED.      This note is prepared by Floyde Parkins, acting as Scribe for Luther Hearing, MD.    Luther Hearing, MD: The scribe's documentation has been prepared under my direction and personally reviewed by me in its entirety. I confirm that the note above accurately reflects all work, treatment, procedures, and medical decision making performed by me.

## 2015-06-08 LAB — URINALYSIS W/ REFLEX CULTURE
Glucose: 250 mg/dL — AB
Nitrites: NEGATIVE
Protein: 30 mg/dL — AB
Specific gravity: >1.03 — ABNORMAL HIGH (ref 1.003–1.030)
Urobilinogen: 0.2 EU/dL (ref 0.2–1.0)
pH (UA): 5.5 (ref 5.0–8.0)

## 2015-06-08 LAB — CBC WITH AUTOMATED DIFF
ABS. BASOPHILS: 0 10*3/uL (ref 0.0–0.1)
ABS. EOSINOPHILS: 0.2 10*3/uL (ref 0.0–0.4)
ABS. LYMPHOCYTES: 1.2 10*3/uL (ref 0.8–3.5)
ABS. MONOCYTES: 0.3 10*3/uL (ref 0.0–1.0)
ABS. NEUTROPHILS: 4.3 10*3/uL (ref 1.8–8.0)
BASOPHILS: 0 % (ref 0–1)
EOSINOPHILS: 3 % (ref 0–7)
HCT: 36.7 % (ref 35.0–47.0)
HGB: 11.9 g/dL (ref 11.5–16.0)
LYMPHOCYTES: 20 % (ref 12–49)
MCH: 26.3 PG (ref 26.0–34.0)
MCHC: 32.4 g/dL (ref 30.0–36.5)
MCV: 81 FL (ref 80.0–99.0)
MONOCYTES: 6 % (ref 5–13)
NEUTROPHILS: 71 % (ref 32–75)
PLATELET: 184 10*3/uL (ref 150–400)
RBC: 4.53 M/uL (ref 3.80–5.20)
RDW: 15.7 % — ABNORMAL HIGH (ref 11.5–14.5)
WBC: 6 10*3/uL (ref 3.6–11.0)

## 2015-06-08 LAB — POC CHEM8
Anion gap (POC): 20 mmol/L — ABNORMAL HIGH (ref 5–15)
BUN (POC): 5 MG/DL — ABNORMAL LOW (ref 9–20)
CO2 (POC): 23 MMOL/L (ref 21–32)
Calcium, ionized (POC): 1.14 MMOL/L (ref 1.12–1.32)
Chloride (POC): 102 MMOL/L (ref 98–107)
Creatinine (POC): 0.3 MG/DL — ABNORMAL LOW (ref 0.6–1.3)
GFRAA, POC: >60 mL/min/{1.73_m2} (ref >60)
GFRNA, POC: >60 mL/min/{1.73_m2} (ref >60)
Glucose (POC): 200 MG/DL — ABNORMAL HIGH (ref 65–100)
Hematocrit (POC): 38 % (ref 35.0–47.0)
Hemoglobin (POC): 12.9 GM/DL (ref 11.5–16.0)
Potassium (POC): 4 MMOL/L (ref 3.5–5.1)
Sodium (POC): 139 MMOL/L (ref 136–145)

## 2015-06-08 LAB — METABOLIC PANEL, COMPREHENSIVE
A-G Ratio: 0.8 — ABNORMAL LOW (ref 1.1–2.2)
ALT (SGPT): 57 U/L (ref 12–78)
AST (SGOT): 71 U/L — ABNORMAL HIGH (ref 15–37)
Albumin: 3.6 g/dL (ref 3.5–5.0)
Alk. phosphatase: 123 U/L — ABNORMAL HIGH (ref 45–117)
Anion gap: 13 mmol/L (ref 5–15)
BUN/Creatinine ratio: 13 (ref 12–20)
BUN: 8 MG/DL (ref 6–20)
Bilirubin, total: 0.3 MG/DL (ref 0.2–1.0)
CO2: 24 mmol/L (ref 21–32)
Calcium: 8.8 MG/DL (ref 8.5–10.1)
Chloride: 101 mmol/L (ref 97–108)
Creatinine: 0.6 MG/DL (ref 0.55–1.02)
GFR est AA: >60 mL/min/{1.73_m2} (ref >60)
GFR est non-AA: >60 mL/min/{1.73_m2} (ref >60)
Globulin: 4.3 g/dL — ABNORMAL HIGH (ref 2.0–4.0)
Glucose: 205 mg/dL — ABNORMAL HIGH (ref 65–100)
Potassium: 4 mmol/L (ref 3.5–5.1)
Protein, total: 7.9 g/dL (ref 6.4–8.2)
Sodium: 138 mmol/L (ref 136–145)

## 2015-06-08 LAB — CK W/ CKMB & INDEX
CK - MB: <1 NG/ML (ref <3.6)
CK: 56 U/L (ref 26–192)

## 2015-06-08 LAB — BILIRUBIN, CONFIRM: Bilirubin UA, confirm: NEGATIVE

## 2015-06-08 LAB — TROPONIN I: Troponin-I, Qt.: <0.04 ng/mL (ref <0.05)

## 2015-06-08 MED ORDER — MORPHINE 2 MG/ML INJECTION
2 mg/mL | INTRAMUSCULAR | Status: AC
Start: 2015-06-08 — End: 2015-06-07
  Administered 2015-06-08: 01:00:00 via INTRAVENOUS

## 2015-06-08 MED ORDER — ONDANSETRON 4 MG TAB, RAPID DISSOLVE
4 mg | ORAL_TABLET | Freq: Three times a day (TID) | ORAL | 0 refills | Status: AC | PRN
Start: 2015-06-08 — End: ?

## 2015-06-08 MED ORDER — HYDROCODONE-ACETAMINOPHEN 5 MG-325 MG TAB
5-325 mg | ORAL_TABLET | ORAL | 0 refills | Status: AC | PRN
Start: 2015-06-08 — End: ?

## 2015-06-08 MED ORDER — SODIUM CHLORIDE 0.9 % IJ SYRG
Freq: Once | INTRAMUSCULAR | Status: AC
Start: 2015-06-08 — End: 2015-06-07
  Administered 2015-06-08: 02:00:00 via INTRAVENOUS

## 2015-06-08 MED ORDER — SALINE PERIPHERAL FLUSH PRN
INTRAMUSCULAR | Status: DC | PRN
Start: 2015-06-08 — End: 2015-06-08

## 2015-06-08 MED ORDER — IOPAMIDOL 76 % IV SOLN
370 mg iodine /mL (76 %) | Freq: Once | INTRAVENOUS | Status: AC
Start: 2015-06-08 — End: 2015-06-07
  Administered 2015-06-08: 02:00:00 via INTRAVENOUS

## 2015-06-08 MED FILL — MORPHINE 2 MG/ML INJECTION: 2 mg/mL | INTRAMUSCULAR | Qty: 1

## 2015-06-09 LAB — EKG, 12 LEAD, INITIAL
Atrial Rate: 114 {beats}/min
Calculated R Axis: -164 degrees
Calculated T Axis: 172 degrees
P-R Interval: 156 ms
Q-T Interval: 340 ms
QRS Duration: 92 ms
QTC Calculation (Bezet): 468 ms
Ventricular Rate: 114 {beats}/min

## 2015-06-09 LAB — CULTURE, URINE
Colonies Counted: >100000
Colony Count: >100000

## 2015-07-12 ENCOUNTER — Encounter: Attending: Family Medicine | Primary: Family Medicine

## 2015-07-29 DIAGNOSIS — G4733 Obstructive sleep apnea (adult) (pediatric): Secondary | ICD-10-CM | POA: Diagnosis not present

## 2015-07-29 DIAGNOSIS — F250 Schizoaffective disorder, bipolar type: Secondary | ICD-10-CM | POA: Diagnosis not present

## 2015-07-29 DIAGNOSIS — Z1389 Encounter for screening for other disorder: Secondary | ICD-10-CM | POA: Diagnosis not present

## 2015-07-29 DIAGNOSIS — F603 Borderline personality disorder: Secondary | ICD-10-CM | POA: Diagnosis not present

## 2015-07-29 DIAGNOSIS — Z6841 Body Mass Index (BMI) 40.0 and over, adult: Secondary | ICD-10-CM | POA: Diagnosis not present

## 2015-07-29 DIAGNOSIS — N912 Amenorrhea, unspecified: Secondary | ICD-10-CM | POA: Diagnosis not present

## 2015-07-29 DIAGNOSIS — R1084 Generalized abdominal pain: Secondary | ICD-10-CM | POA: Diagnosis not present

## 2015-07-29 DIAGNOSIS — E1140 Type 2 diabetes mellitus with diabetic neuropathy, unspecified: Secondary | ICD-10-CM | POA: Diagnosis not present

## 2015-07-29 DIAGNOSIS — I10 Essential (primary) hypertension: Secondary | ICD-10-CM | POA: Diagnosis not present

## 2015-08-12 DIAGNOSIS — E1040 Type 1 diabetes mellitus with diabetic neuropathy, unspecified: Secondary | ICD-10-CM | POA: Diagnosis not present

## 2015-08-12 DIAGNOSIS — E669 Obesity, unspecified: Secondary | ICD-10-CM | POA: Diagnosis not present

## 2015-08-12 DIAGNOSIS — K219 Gastro-esophageal reflux disease without esophagitis: Secondary | ICD-10-CM | POA: Diagnosis not present

## 2015-08-12 DIAGNOSIS — Z6841 Body Mass Index (BMI) 40.0 and over, adult: Secondary | ICD-10-CM | POA: Diagnosis not present

## 2015-08-12 DIAGNOSIS — Z888 Allergy status to other drugs, medicaments and biological substances status: Secondary | ICD-10-CM | POA: Diagnosis not present

## 2015-08-12 DIAGNOSIS — I10 Essential (primary) hypertension: Secondary | ICD-10-CM | POA: Diagnosis not present

## 2015-08-12 DIAGNOSIS — E1065 Type 1 diabetes mellitus with hyperglycemia: Secondary | ICD-10-CM | POA: Diagnosis not present

## 2015-08-12 DIAGNOSIS — F99 Mental disorder, not otherwise specified: Secondary | ICD-10-CM | POA: Diagnosis not present

## 2015-08-12 DIAGNOSIS — Z886 Allergy status to analgesic agent status: Secondary | ICD-10-CM | POA: Diagnosis not present

## 2015-08-12 DIAGNOSIS — R112 Nausea with vomiting, unspecified: Secondary | ICD-10-CM | POA: Diagnosis not present

## 2015-08-12 DIAGNOSIS — R5383 Other fatigue: Secondary | ICD-10-CM | POA: Diagnosis not present

## 2015-09-24 DIAGNOSIS — F259 Schizoaffective disorder, unspecified: Secondary | ICD-10-CM | POA: Diagnosis not present

## 2015-09-25 DIAGNOSIS — R197 Diarrhea, unspecified: Secondary | ICD-10-CM | POA: Diagnosis not present

## 2015-09-25 DIAGNOSIS — E1165 Type 2 diabetes mellitus with hyperglycemia: Secondary | ICD-10-CM | POA: Diagnosis not present

## 2015-09-25 DIAGNOSIS — R1031 Right lower quadrant pain: Secondary | ICD-10-CM | POA: Diagnosis not present

## 2015-09-25 DIAGNOSIS — R100 Acute abdomen: Secondary | ICD-10-CM | POA: Diagnosis not present

## 2015-09-25 DIAGNOSIS — K591 Functional diarrhea: Secondary | ICD-10-CM | POA: Diagnosis not present

## 2015-09-26 DIAGNOSIS — F259 Schizoaffective disorder, unspecified: Secondary | ICD-10-CM | POA: Diagnosis not present

## 2015-09-27 DIAGNOSIS — F259 Schizoaffective disorder, unspecified: Secondary | ICD-10-CM | POA: Diagnosis not present

## 2015-09-28 DIAGNOSIS — F259 Schizoaffective disorder, unspecified: Secondary | ICD-10-CM | POA: Diagnosis not present

## 2015-09-29 DIAGNOSIS — F259 Schizoaffective disorder, unspecified: Secondary | ICD-10-CM | POA: Diagnosis not present

## 2015-09-29 DIAGNOSIS — M791 Myalgia: Secondary | ICD-10-CM | POA: Diagnosis not present

## 2015-09-29 DIAGNOSIS — E1165 Type 2 diabetes mellitus with hyperglycemia: Secondary | ICD-10-CM | POA: Diagnosis not present

## 2015-09-29 DIAGNOSIS — R100 Acute abdomen: Secondary | ICD-10-CM | POA: Diagnosis not present

## 2015-09-29 DIAGNOSIS — K219 Gastro-esophageal reflux disease without esophagitis: Secondary | ICD-10-CM | POA: Diagnosis not present

## 2015-09-30 DIAGNOSIS — F259 Schizoaffective disorder, unspecified: Secondary | ICD-10-CM | POA: Diagnosis not present

## 2015-10-01 DIAGNOSIS — F259 Schizoaffective disorder, unspecified: Secondary | ICD-10-CM | POA: Diagnosis not present

## 2015-10-02 DIAGNOSIS — F259 Schizoaffective disorder, unspecified: Secondary | ICD-10-CM | POA: Diagnosis not present

## 2015-10-03 DIAGNOSIS — F259 Schizoaffective disorder, unspecified: Secondary | ICD-10-CM | POA: Diagnosis not present

## 2015-10-04 DIAGNOSIS — F259 Schizoaffective disorder, unspecified: Secondary | ICD-10-CM | POA: Diagnosis not present

## 2015-10-05 DIAGNOSIS — F259 Schizoaffective disorder, unspecified: Secondary | ICD-10-CM | POA: Diagnosis not present

## 2015-10-06 DIAGNOSIS — R1310 Dysphagia, unspecified: Secondary | ICD-10-CM | POA: Diagnosis not present

## 2015-10-06 DIAGNOSIS — E1165 Type 2 diabetes mellitus with hyperglycemia: Secondary | ICD-10-CM | POA: Diagnosis not present

## 2015-10-06 DIAGNOSIS — F259 Schizoaffective disorder, unspecified: Secondary | ICD-10-CM | POA: Diagnosis not present

## 2015-10-06 DIAGNOSIS — Z85831 Personal history of malignant neoplasm of soft tissue: Secondary | ICD-10-CM | POA: Diagnosis not present

## 2015-10-06 DIAGNOSIS — N938 Other specified abnormal uterine and vaginal bleeding: Secondary | ICD-10-CM | POA: Diagnosis not present

## 2015-10-07 DIAGNOSIS — F259 Schizoaffective disorder, unspecified: Secondary | ICD-10-CM | POA: Diagnosis not present

## 2015-10-08 DIAGNOSIS — F259 Schizoaffective disorder, unspecified: Secondary | ICD-10-CM | POA: Diagnosis not present

## 2015-10-09 DIAGNOSIS — F259 Schizoaffective disorder, unspecified: Secondary | ICD-10-CM | POA: Diagnosis not present

## 2015-10-10 DIAGNOSIS — F259 Schizoaffective disorder, unspecified: Secondary | ICD-10-CM | POA: Diagnosis not present

## 2015-10-11 DIAGNOSIS — F259 Schizoaffective disorder, unspecified: Secondary | ICD-10-CM | POA: Diagnosis not present

## 2015-10-12 DIAGNOSIS — R1084 Generalized abdominal pain: Secondary | ICD-10-CM | POA: Diagnosis not present

## 2015-10-12 DIAGNOSIS — E119 Type 2 diabetes mellitus without complications: Secondary | ICD-10-CM | POA: Diagnosis not present

## 2015-10-12 DIAGNOSIS — K429 Umbilical hernia without obstruction or gangrene: Secondary | ICD-10-CM | POA: Diagnosis not present

## 2015-10-12 DIAGNOSIS — K760 Fatty (change of) liver, not elsewhere classified: Secondary | ICD-10-CM | POA: Diagnosis not present

## 2015-10-12 DIAGNOSIS — F319 Bipolar disorder, unspecified: Secondary | ICD-10-CM | POA: Diagnosis not present

## 2015-10-12 DIAGNOSIS — K5900 Constipation, unspecified: Secondary | ICD-10-CM | POA: Diagnosis not present

## 2015-10-12 DIAGNOSIS — F259 Schizoaffective disorder, unspecified: Secondary | ICD-10-CM | POA: Diagnosis not present

## 2015-10-12 DIAGNOSIS — R1310 Dysphagia, unspecified: Secondary | ICD-10-CM | POA: Diagnosis not present

## 2015-10-12 DIAGNOSIS — R140 Abdominal distension (gaseous): Secondary | ICD-10-CM | POA: Diagnosis not present

## 2015-10-13 DIAGNOSIS — K5901 Slow transit constipation: Secondary | ICD-10-CM | POA: Diagnosis not present

## 2015-10-13 DIAGNOSIS — E1165 Type 2 diabetes mellitus with hyperglycemia: Secondary | ICD-10-CM | POA: Diagnosis not present

## 2015-10-13 DIAGNOSIS — F259 Schizoaffective disorder, unspecified: Secondary | ICD-10-CM | POA: Diagnosis not present

## 2015-10-14 DIAGNOSIS — K760 Fatty (change of) liver, not elsewhere classified: Secondary | ICD-10-CM | POA: Diagnosis not present

## 2015-10-14 DIAGNOSIS — K429 Umbilical hernia without obstruction or gangrene: Secondary | ICD-10-CM | POA: Diagnosis not present

## 2015-10-14 DIAGNOSIS — R1310 Dysphagia, unspecified: Secondary | ICD-10-CM | POA: Diagnosis not present

## 2015-10-14 DIAGNOSIS — E119 Type 2 diabetes mellitus without complications: Secondary | ICD-10-CM | POA: Diagnosis not present

## 2015-10-14 DIAGNOSIS — F259 Schizoaffective disorder, unspecified: Secondary | ICD-10-CM | POA: Diagnosis not present

## 2015-10-14 DIAGNOSIS — F319 Bipolar disorder, unspecified: Secondary | ICD-10-CM | POA: Diagnosis not present

## 2015-10-15 DIAGNOSIS — S0993XA Unspecified injury of face, initial encounter: Secondary | ICD-10-CM | POA: Diagnosis not present

## 2015-10-15 DIAGNOSIS — Z765 Malingerer [conscious simulation]: Secondary | ICD-10-CM | POA: Diagnosis not present

## 2015-10-15 DIAGNOSIS — S098XXA Other specified injuries of head, initial encounter: Secondary | ICD-10-CM | POA: Diagnosis not present

## 2015-10-16 DIAGNOSIS — F259 Schizoaffective disorder, unspecified: Secondary | ICD-10-CM | POA: Diagnosis not present

## 2015-10-17 DIAGNOSIS — F259 Schizoaffective disorder, unspecified: Secondary | ICD-10-CM | POA: Diagnosis not present

## 2015-10-18 DIAGNOSIS — F259 Schizoaffective disorder, unspecified: Secondary | ICD-10-CM | POA: Diagnosis not present

## 2015-10-19 DIAGNOSIS — E119 Type 2 diabetes mellitus without complications: Secondary | ICD-10-CM | POA: Diagnosis not present

## 2015-10-19 DIAGNOSIS — K760 Fatty (change of) liver, not elsewhere classified: Secondary | ICD-10-CM | POA: Diagnosis not present

## 2015-10-19 DIAGNOSIS — F319 Bipolar disorder, unspecified: Secondary | ICD-10-CM | POA: Diagnosis not present

## 2015-10-19 DIAGNOSIS — K429 Umbilical hernia without obstruction or gangrene: Secondary | ICD-10-CM | POA: Diagnosis not present

## 2015-10-19 DIAGNOSIS — F259 Schizoaffective disorder, unspecified: Secondary | ICD-10-CM | POA: Diagnosis not present

## 2015-10-19 DIAGNOSIS — R1310 Dysphagia, unspecified: Secondary | ICD-10-CM | POA: Diagnosis not present

## 2015-10-20 DIAGNOSIS — F259 Schizoaffective disorder, unspecified: Secondary | ICD-10-CM | POA: Diagnosis not present

## 2015-10-20 DIAGNOSIS — G629 Polyneuropathy, unspecified: Secondary | ICD-10-CM | POA: Diagnosis not present

## 2015-10-20 DIAGNOSIS — K5901 Slow transit constipation: Secondary | ICD-10-CM | POA: Diagnosis not present

## 2015-10-20 DIAGNOSIS — E1165 Type 2 diabetes mellitus with hyperglycemia: Secondary | ICD-10-CM | POA: Diagnosis not present

## 2015-10-20 DIAGNOSIS — C414 Malignant neoplasm of pelvic bones, sacrum and coccyx: Secondary | ICD-10-CM | POA: Diagnosis not present

## 2015-10-21 DIAGNOSIS — F319 Bipolar disorder, unspecified: Secondary | ICD-10-CM | POA: Diagnosis not present

## 2015-10-21 DIAGNOSIS — K760 Fatty (change of) liver, not elsewhere classified: Secondary | ICD-10-CM | POA: Diagnosis not present

## 2015-10-21 DIAGNOSIS — K429 Umbilical hernia without obstruction or gangrene: Secondary | ICD-10-CM | POA: Diagnosis not present

## 2015-10-21 DIAGNOSIS — F259 Schizoaffective disorder, unspecified: Secondary | ICD-10-CM | POA: Diagnosis not present

## 2015-10-21 DIAGNOSIS — E119 Type 2 diabetes mellitus without complications: Secondary | ICD-10-CM | POA: Diagnosis not present

## 2015-10-21 DIAGNOSIS — R1310 Dysphagia, unspecified: Secondary | ICD-10-CM | POA: Diagnosis not present

## 2015-10-22 DIAGNOSIS — F259 Schizoaffective disorder, unspecified: Secondary | ICD-10-CM | POA: Diagnosis not present

## 2015-10-23 DIAGNOSIS — F259 Schizoaffective disorder, unspecified: Secondary | ICD-10-CM | POA: Diagnosis not present

## 2015-10-25 DIAGNOSIS — K760 Fatty (change of) liver, not elsewhere classified: Secondary | ICD-10-CM | POA: Diagnosis not present

## 2015-10-25 DIAGNOSIS — E119 Type 2 diabetes mellitus without complications: Secondary | ICD-10-CM | POA: Diagnosis not present

## 2015-10-25 DIAGNOSIS — R1310 Dysphagia, unspecified: Secondary | ICD-10-CM | POA: Diagnosis not present

## 2015-10-25 DIAGNOSIS — F259 Schizoaffective disorder, unspecified: Secondary | ICD-10-CM | POA: Diagnosis not present

## 2015-10-25 DIAGNOSIS — K429 Umbilical hernia without obstruction or gangrene: Secondary | ICD-10-CM | POA: Diagnosis not present

## 2015-10-25 DIAGNOSIS — F319 Bipolar disorder, unspecified: Secondary | ICD-10-CM | POA: Diagnosis not present

## 2015-10-26 DIAGNOSIS — F259 Schizoaffective disorder, unspecified: Secondary | ICD-10-CM | POA: Diagnosis not present

## 2015-10-27 DIAGNOSIS — F259 Schizoaffective disorder, unspecified: Secondary | ICD-10-CM | POA: Diagnosis not present

## 2015-10-28 DIAGNOSIS — K429 Umbilical hernia without obstruction or gangrene: Secondary | ICD-10-CM | POA: Diagnosis not present

## 2015-10-28 DIAGNOSIS — F319 Bipolar disorder, unspecified: Secondary | ICD-10-CM | POA: Diagnosis not present

## 2015-10-28 DIAGNOSIS — R1310 Dysphagia, unspecified: Secondary | ICD-10-CM | POA: Diagnosis not present

## 2015-10-28 DIAGNOSIS — F259 Schizoaffective disorder, unspecified: Secondary | ICD-10-CM | POA: Diagnosis not present

## 2015-10-28 DIAGNOSIS — K760 Fatty (change of) liver, not elsewhere classified: Secondary | ICD-10-CM | POA: Diagnosis not present

## 2015-10-28 DIAGNOSIS — E119 Type 2 diabetes mellitus without complications: Secondary | ICD-10-CM | POA: Diagnosis not present

## 2015-10-29 DIAGNOSIS — G629 Polyneuropathy, unspecified: Secondary | ICD-10-CM | POA: Diagnosis not present

## 2015-10-29 DIAGNOSIS — F259 Schizoaffective disorder, unspecified: Secondary | ICD-10-CM | POA: Diagnosis not present

## 2015-10-29 DIAGNOSIS — M791 Myalgia: Secondary | ICD-10-CM | POA: Diagnosis not present

## 2015-10-29 DIAGNOSIS — E1165 Type 2 diabetes mellitus with hyperglycemia: Secondary | ICD-10-CM | POA: Diagnosis not present

## 2015-10-29 DIAGNOSIS — K5901 Slow transit constipation: Secondary | ICD-10-CM | POA: Diagnosis not present

## 2015-10-30 DIAGNOSIS — F259 Schizoaffective disorder, unspecified: Secondary | ICD-10-CM | POA: Diagnosis not present

## 2015-10-31 DIAGNOSIS — F259 Schizoaffective disorder, unspecified: Secondary | ICD-10-CM | POA: Diagnosis not present

## 2015-11-01 DIAGNOSIS — F259 Schizoaffective disorder, unspecified: Secondary | ICD-10-CM | POA: Diagnosis not present

## 2015-11-01 DIAGNOSIS — E119 Type 2 diabetes mellitus without complications: Secondary | ICD-10-CM | POA: Diagnosis not present

## 2015-11-01 DIAGNOSIS — K429 Umbilical hernia without obstruction or gangrene: Secondary | ICD-10-CM | POA: Diagnosis not present

## 2015-11-01 DIAGNOSIS — F319 Bipolar disorder, unspecified: Secondary | ICD-10-CM | POA: Diagnosis not present

## 2015-11-01 DIAGNOSIS — K760 Fatty (change of) liver, not elsewhere classified: Secondary | ICD-10-CM | POA: Diagnosis not present

## 2015-11-01 DIAGNOSIS — R1310 Dysphagia, unspecified: Secondary | ICD-10-CM | POA: Diagnosis not present

## 2015-11-02 DIAGNOSIS — F259 Schizoaffective disorder, unspecified: Secondary | ICD-10-CM | POA: Diagnosis not present

## 2015-11-03 DIAGNOSIS — F319 Bipolar disorder, unspecified: Secondary | ICD-10-CM | POA: Diagnosis not present

## 2015-11-03 DIAGNOSIS — F259 Schizoaffective disorder, unspecified: Secondary | ICD-10-CM | POA: Diagnosis not present

## 2015-11-03 DIAGNOSIS — R1310 Dysphagia, unspecified: Secondary | ICD-10-CM | POA: Diagnosis not present

## 2015-11-03 DIAGNOSIS — K760 Fatty (change of) liver, not elsewhere classified: Secondary | ICD-10-CM | POA: Diagnosis not present

## 2015-11-03 DIAGNOSIS — E119 Type 2 diabetes mellitus without complications: Secondary | ICD-10-CM | POA: Diagnosis not present

## 2015-11-03 DIAGNOSIS — K429 Umbilical hernia without obstruction or gangrene: Secondary | ICD-10-CM | POA: Diagnosis not present

## 2015-11-04 DIAGNOSIS — E119 Type 2 diabetes mellitus without complications: Secondary | ICD-10-CM | POA: Diagnosis not present

## 2015-11-04 DIAGNOSIS — R1310 Dysphagia, unspecified: Secondary | ICD-10-CM | POA: Diagnosis not present

## 2015-11-04 DIAGNOSIS — K429 Umbilical hernia without obstruction or gangrene: Secondary | ICD-10-CM | POA: Diagnosis not present

## 2015-11-04 DIAGNOSIS — K760 Fatty (change of) liver, not elsewhere classified: Secondary | ICD-10-CM | POA: Diagnosis not present

## 2015-11-04 DIAGNOSIS — F259 Schizoaffective disorder, unspecified: Secondary | ICD-10-CM | POA: Diagnosis not present

## 2015-11-05 DIAGNOSIS — F259 Schizoaffective disorder, unspecified: Secondary | ICD-10-CM | POA: Diagnosis not present

## 2015-11-06 DIAGNOSIS — F259 Schizoaffective disorder, unspecified: Secondary | ICD-10-CM | POA: Diagnosis not present

## 2015-11-07 DIAGNOSIS — F259 Schizoaffective disorder, unspecified: Secondary | ICD-10-CM | POA: Diagnosis not present

## 2015-11-08 DIAGNOSIS — F259 Schizoaffective disorder, unspecified: Secondary | ICD-10-CM | POA: Diagnosis not present

## 2015-11-09 DIAGNOSIS — F319 Bipolar disorder, unspecified: Secondary | ICD-10-CM | POA: Diagnosis not present

## 2015-11-09 DIAGNOSIS — F259 Schizoaffective disorder, unspecified: Secondary | ICD-10-CM | POA: Diagnosis not present

## 2015-11-09 DIAGNOSIS — E119 Type 2 diabetes mellitus without complications: Secondary | ICD-10-CM | POA: Diagnosis not present

## 2015-11-09 DIAGNOSIS — R1310 Dysphagia, unspecified: Secondary | ICD-10-CM | POA: Diagnosis not present

## 2015-11-09 DIAGNOSIS — K760 Fatty (change of) liver, not elsewhere classified: Secondary | ICD-10-CM | POA: Diagnosis not present

## 2015-11-09 DIAGNOSIS — K429 Umbilical hernia without obstruction or gangrene: Secondary | ICD-10-CM | POA: Diagnosis not present

## 2015-11-10 DIAGNOSIS — K469 Unspecified abdominal hernia without obstruction or gangrene: Secondary | ICD-10-CM | POA: Diagnosis not present

## 2015-11-10 DIAGNOSIS — F259 Schizoaffective disorder, unspecified: Secondary | ICD-10-CM | POA: Diagnosis not present

## 2015-11-10 DIAGNOSIS — R1310 Dysphagia, unspecified: Secondary | ICD-10-CM | POA: Diagnosis not present

## 2015-11-10 DIAGNOSIS — K760 Fatty (change of) liver, not elsewhere classified: Secondary | ICD-10-CM | POA: Diagnosis not present

## 2015-11-10 DIAGNOSIS — E1165 Type 2 diabetes mellitus with hyperglycemia: Secondary | ICD-10-CM | POA: Diagnosis not present

## 2015-11-10 DIAGNOSIS — K429 Umbilical hernia without obstruction or gangrene: Secondary | ICD-10-CM | POA: Diagnosis not present

## 2015-11-10 DIAGNOSIS — E119 Type 2 diabetes mellitus without complications: Secondary | ICD-10-CM | POA: Diagnosis not present

## 2015-11-10 DIAGNOSIS — F319 Bipolar disorder, unspecified: Secondary | ICD-10-CM | POA: Diagnosis not present

## 2015-11-11 DIAGNOSIS — F259 Schizoaffective disorder, unspecified: Secondary | ICD-10-CM | POA: Diagnosis not present

## 2015-11-12 DIAGNOSIS — F259 Schizoaffective disorder, unspecified: Secondary | ICD-10-CM | POA: Diagnosis not present

## 2015-11-12 DIAGNOSIS — C499 Malignant neoplasm of connective and soft tissue, unspecified: Secondary | ICD-10-CM | POA: Diagnosis not present

## 2015-11-12 DIAGNOSIS — J90 Pleural effusion, not elsewhere classified: Secondary | ICD-10-CM | POA: Diagnosis not present

## 2015-11-13 DIAGNOSIS — F259 Schizoaffective disorder, unspecified: Secondary | ICD-10-CM | POA: Diagnosis not present

## 2015-11-14 DIAGNOSIS — F259 Schizoaffective disorder, unspecified: Secondary | ICD-10-CM | POA: Diagnosis not present

## 2015-11-15 DIAGNOSIS — F259 Schizoaffective disorder, unspecified: Secondary | ICD-10-CM | POA: Diagnosis not present

## 2015-11-15 DIAGNOSIS — E119 Type 2 diabetes mellitus without complications: Secondary | ICD-10-CM | POA: Diagnosis not present

## 2015-11-15 DIAGNOSIS — F319 Bipolar disorder, unspecified: Secondary | ICD-10-CM | POA: Diagnosis not present

## 2015-11-15 DIAGNOSIS — R1310 Dysphagia, unspecified: Secondary | ICD-10-CM | POA: Diagnosis not present

## 2015-11-15 DIAGNOSIS — K760 Fatty (change of) liver, not elsewhere classified: Secondary | ICD-10-CM | POA: Diagnosis not present

## 2015-11-15 DIAGNOSIS — K429 Umbilical hernia without obstruction or gangrene: Secondary | ICD-10-CM | POA: Diagnosis not present

## 2015-11-16 DIAGNOSIS — F259 Schizoaffective disorder, unspecified: Secondary | ICD-10-CM | POA: Diagnosis not present

## 2015-11-17 DIAGNOSIS — F259 Schizoaffective disorder, unspecified: Secondary | ICD-10-CM | POA: Diagnosis not present

## 2015-11-18 DIAGNOSIS — F259 Schizoaffective disorder, unspecified: Secondary | ICD-10-CM | POA: Diagnosis not present

## 2015-11-19 DIAGNOSIS — G63 Polyneuropathy in diseases classified elsewhere: Secondary | ICD-10-CM | POA: Diagnosis not present

## 2015-11-19 DIAGNOSIS — F259 Schizoaffective disorder, unspecified: Secondary | ICD-10-CM | POA: Diagnosis not present

## 2015-11-19 DIAGNOSIS — B351 Tinea unguium: Secondary | ICD-10-CM | POA: Diagnosis not present

## 2015-11-19 DIAGNOSIS — I70203 Unspecified atherosclerosis of native arteries of extremities, bilateral legs: Secondary | ICD-10-CM | POA: Diagnosis not present

## 2015-11-19 DIAGNOSIS — E1142 Type 2 diabetes mellitus with diabetic polyneuropathy: Secondary | ICD-10-CM | POA: Diagnosis not present

## 2015-11-20 DIAGNOSIS — F259 Schizoaffective disorder, unspecified: Secondary | ICD-10-CM | POA: Diagnosis not present

## 2015-11-21 DIAGNOSIS — F259 Schizoaffective disorder, unspecified: Secondary | ICD-10-CM | POA: Diagnosis not present

## 2015-11-22 DIAGNOSIS — F259 Schizoaffective disorder, unspecified: Secondary | ICD-10-CM | POA: Diagnosis not present

## 2015-11-23 DIAGNOSIS — F259 Schizoaffective disorder, unspecified: Secondary | ICD-10-CM | POA: Diagnosis not present

## 2015-11-24 ENCOUNTER — Ambulatory Visit (INDEPENDENT_AMBULATORY_CARE_PROVIDER_SITE_OTHER): Payer: Commercial Managed Care - HMO | Admitting: Family Medicine

## 2015-11-24 ENCOUNTER — Encounter: Payer: Self-pay | Admitting: Family Medicine

## 2015-11-24 ENCOUNTER — Other Ambulatory Visit (HOSPITAL_COMMUNITY)
Admission: RE | Admit: 2015-11-24 | Discharge: 2015-11-24 | Disposition: A | Discharge status: Home / Self Care | Payer: Commercial Managed Care - HMO | Source: Ambulatory Visit | Attending: Family Medicine | Admitting: Family Medicine

## 2015-11-24 VITALS — BP 124/70 | HR 82 | Resp 16 | Wt 246.0 lb

## 2015-11-24 DIAGNOSIS — Z124 Encounter for screening for malignant neoplasm of cervix: Secondary | ICD-10-CM | POA: Diagnosis not present

## 2015-11-24 DIAGNOSIS — R102 Pelvic and perineal pain: Secondary | ICD-10-CM

## 2015-11-24 DIAGNOSIS — E282 Polycystic ovarian syndrome: Secondary | ICD-10-CM

## 2015-11-24 DIAGNOSIS — F259 Schizoaffective disorder, unspecified: Secondary | ICD-10-CM | POA: Diagnosis not present

## 2015-11-24 DIAGNOSIS — Z01419 Encounter for gynecological examination (general) (routine) without abnormal findings: Secondary | ICD-10-CM | POA: Insufficient documentation

## 2015-11-24 MED ORDER — NORGESTIMATE-ETH ESTRADIOL 0.25-35 MG-MCG PO TABS: 1.0000 | ORAL_TABLET | Freq: Every day | ORAL | 11 refills | Status: DC

## 2015-11-24 NOTE — Progress Notes (Signed)
Subjective:     Tina Saunders is a 30 y.o. female and is here for a comprehensive physical exam. She is a resident of a group home, unable to preform all of her ADLs. The patient reports pain in right pelvic area, intermittent. lasts for the entired day when she has it. Sharp pain. about 2-3 times a week. No palliating or provoking factors. Feels like when she had ovarian cysts.  Not sexually active. Not able to relay how frequent menses are. Last period 2 weeks ago and was 7 days long with heavy bleeding.  Social History   Social History  . Marital status: Single    Spouse name: N/A  . Number of children: N/A  . Years of education: N/A   Occupational History  . Not on file.   Social History Main Topics  . Smoking status: Never Smoker  . Smokeless tobacco: Never Used  . Alcohol use No  . Drug use: No  . Sexual activity: No   Other Topics Concern  . Not on file   Social History Narrative  . No narrative on file   Health Maintenance  Topic Date Due  . PAP SMEAR  12/30/2007  . INFLUENZA VACCINE  08/24/2015  . TETANUS/TDAP  09/22/2017  . HIV Screening  Completed    The following portions of the patient's history were reviewed and updated as appropriate: allergies, current medications, past family history, past medical history, past social history, past surgical history and problem list.  Review of Systems Pertinent items noted in HPI and remainder of comprehensive ROS otherwise negative.   Objective:      General Appearance:    Alert, cooperative, no distress, appears stated age  Head:    Normocephalic, without obvious abnormality, atraumatic  Eyes:    PERRL, conjunctiva/corneas clear, EOM's intact, fundi    benign, both eyes  Neck:   Supple, symmetrical, trachea midline, no adenopathy;    thyroid:  no enlargement/tenderness/nodules; no carotid   bruit or JVD  Back:     Symmetric, no curvature, ROM normal, no CVA tenderness  Lungs:     Clear to auscultation  bilaterally, respirations unlabored  Chest Wall:    No tenderness or deformity   Heart:    Regular rate and rhythm, S1 and S2 normal, no murmur, rub   or gallop  Breast Exam:    No tenderness, masses, or nipple abnormality  Abdomen:     Obese, Soft, non-tender, bowel sounds active all four quadrants,    no masses, no organomegaly  Genitalia:    Normal female without lesion, discharge or tenderness.         Vaginal rugae normal.  Cervix normal in appearance.  Uterus   normal in size.  Adnexa normal, no masses or fullness   palpated.  Extremities:   Extremities normal, atraumatic, no cyanosis or edema  Pulses:   2+ and symmetric all extremities  Skin:   Skin color, texture, turgor normal, no rashes or lesions  Lymph nodes:   Cervical, supraclavicular, and axillary nodes normal  Neurologic:   CNII-XII intact, normal strength, sensation and reflexes    throughout       Assessment:        Plan:   1. Well woman exam with routine gynecological exam Continue healthy diet - Cytology - PAP  2. POLYCYSTIC OVARIAN DISEASE Will start OCPs. Group home does administer meds, so will be reliable. This will help with PCOS and menses.  3. Pelvic pain in  female Possibly cysts. - US Pelvis Complete; Future - US Transvaginal Non-OB; Future     See After Visit Summary for Counseling Recommendations

## 2015-11-25 DIAGNOSIS — F259 Schizoaffective disorder, unspecified: Secondary | ICD-10-CM | POA: Diagnosis not present

## 2015-11-25 LAB — CYTOLOGY - PAP: DIAGNOSIS: NEGATIVE

## 2015-11-26 DIAGNOSIS — F259 Schizoaffective disorder, unspecified: Secondary | ICD-10-CM | POA: Diagnosis not present

## 2015-11-27 DIAGNOSIS — F259 Schizoaffective disorder, unspecified: Secondary | ICD-10-CM | POA: Diagnosis not present

## 2015-11-28 DIAGNOSIS — F259 Schizoaffective disorder, unspecified: Secondary | ICD-10-CM | POA: Diagnosis not present

## 2015-11-29 DIAGNOSIS — E559 Vitamin D deficiency, unspecified: Secondary | ICD-10-CM | POA: Diagnosis not present

## 2015-11-29 DIAGNOSIS — E782 Mixed hyperlipidemia: Secondary | ICD-10-CM | POA: Diagnosis not present

## 2015-11-29 DIAGNOSIS — E119 Type 2 diabetes mellitus without complications: Secondary | ICD-10-CM | POA: Diagnosis not present

## 2015-11-29 DIAGNOSIS — F259 Schizoaffective disorder, unspecified: Secondary | ICD-10-CM | POA: Diagnosis not present

## 2015-11-29 DIAGNOSIS — D518 Other vitamin B12 deficiency anemias: Secondary | ICD-10-CM | POA: Diagnosis not present

## 2015-11-29 DIAGNOSIS — E038 Other specified hypothyroidism: Secondary | ICD-10-CM | POA: Diagnosis not present

## 2015-11-29 DIAGNOSIS — Z79899 Other long term (current) drug therapy: Secondary | ICD-10-CM | POA: Diagnosis not present

## 2015-11-30 DIAGNOSIS — F259 Schizoaffective disorder, unspecified: Secondary | ICD-10-CM | POA: Diagnosis not present

## 2015-12-01 DIAGNOSIS — F259 Schizoaffective disorder, unspecified: Secondary | ICD-10-CM | POA: Diagnosis not present

## 2015-12-02 DIAGNOSIS — F259 Schizoaffective disorder, unspecified: Secondary | ICD-10-CM | POA: Diagnosis not present

## 2015-12-03 DIAGNOSIS — F259 Schizoaffective disorder, unspecified: Secondary | ICD-10-CM | POA: Diagnosis not present

## 2015-12-04 DIAGNOSIS — F259 Schizoaffective disorder, unspecified: Secondary | ICD-10-CM | POA: Diagnosis not present

## 2015-12-05 DIAGNOSIS — F259 Schizoaffective disorder, unspecified: Secondary | ICD-10-CM | POA: Diagnosis not present

## 2015-12-06 DIAGNOSIS — F259 Schizoaffective disorder, unspecified: Secondary | ICD-10-CM | POA: Diagnosis not present

## 2015-12-07 DIAGNOSIS — F259 Schizoaffective disorder, unspecified: Secondary | ICD-10-CM | POA: Diagnosis not present

## 2015-12-08 DIAGNOSIS — F259 Schizoaffective disorder, unspecified: Secondary | ICD-10-CM | POA: Diagnosis not present

## 2015-12-09 DIAGNOSIS — E1165 Type 2 diabetes mellitus with hyperglycemia: Secondary | ICD-10-CM | POA: Diagnosis not present

## 2015-12-09 DIAGNOSIS — N390 Urinary tract infection, site not specified: Secondary | ICD-10-CM | POA: Diagnosis not present

## 2015-12-09 DIAGNOSIS — F259 Schizoaffective disorder, unspecified: Secondary | ICD-10-CM | POA: Diagnosis not present

## 2015-12-09 DIAGNOSIS — I10 Essential (primary) hypertension: Secondary | ICD-10-CM | POA: Diagnosis not present

## 2015-12-09 DIAGNOSIS — K469 Unspecified abdominal hernia without obstruction or gangrene: Secondary | ICD-10-CM | POA: Diagnosis not present

## 2015-12-10 DIAGNOSIS — F259 Schizoaffective disorder, unspecified: Secondary | ICD-10-CM | POA: Diagnosis not present

## 2015-12-11 DIAGNOSIS — F259 Schizoaffective disorder, unspecified: Secondary | ICD-10-CM | POA: Diagnosis not present

## 2015-12-12 DIAGNOSIS — F259 Schizoaffective disorder, unspecified: Secondary | ICD-10-CM | POA: Diagnosis not present

## 2015-12-13 DIAGNOSIS — F259 Schizoaffective disorder, unspecified: Secondary | ICD-10-CM | POA: Diagnosis not present

## 2015-12-13 DIAGNOSIS — N390 Urinary tract infection, site not specified: Secondary | ICD-10-CM | POA: Diagnosis not present

## 2015-12-14 DIAGNOSIS — F259 Schizoaffective disorder, unspecified: Secondary | ICD-10-CM | POA: Diagnosis not present

## 2015-12-18 DIAGNOSIS — F259 Schizoaffective disorder, unspecified: Secondary | ICD-10-CM | POA: Diagnosis not present

## 2015-12-19 DIAGNOSIS — F259 Schizoaffective disorder, unspecified: Secondary | ICD-10-CM | POA: Diagnosis not present

## 2015-12-20 DIAGNOSIS — F259 Schizoaffective disorder, unspecified: Secondary | ICD-10-CM | POA: Diagnosis not present

## 2015-12-21 DIAGNOSIS — F259 Schizoaffective disorder, unspecified: Secondary | ICD-10-CM | POA: Diagnosis not present

## 2015-12-22 DIAGNOSIS — F259 Schizoaffective disorder, unspecified: Secondary | ICD-10-CM | POA: Diagnosis not present

## 2015-12-23 DIAGNOSIS — F259 Schizoaffective disorder, unspecified: Secondary | ICD-10-CM | POA: Diagnosis not present

## 2015-12-24 DIAGNOSIS — F259 Schizoaffective disorder, unspecified: Secondary | ICD-10-CM | POA: Diagnosis not present

## 2015-12-25 DIAGNOSIS — F259 Schizoaffective disorder, unspecified: Secondary | ICD-10-CM | POA: Diagnosis not present

## 2015-12-26 DIAGNOSIS — F259 Schizoaffective disorder, unspecified: Secondary | ICD-10-CM | POA: Diagnosis not present

## 2015-12-27 DIAGNOSIS — E782 Mixed hyperlipidemia: Secondary | ICD-10-CM | POA: Diagnosis not present

## 2015-12-27 DIAGNOSIS — Z79899 Other long term (current) drug therapy: Secondary | ICD-10-CM | POA: Diagnosis not present

## 2015-12-27 DIAGNOSIS — F259 Schizoaffective disorder, unspecified: Secondary | ICD-10-CM | POA: Diagnosis not present

## 2015-12-28 DIAGNOSIS — F259 Schizoaffective disorder, unspecified: Secondary | ICD-10-CM | POA: Diagnosis not present

## 2015-12-29 DIAGNOSIS — K529 Noninfective gastroenteritis and colitis, unspecified: Secondary | ICD-10-CM | POA: Diagnosis not present

## 2015-12-29 DIAGNOSIS — F259 Schizoaffective disorder, unspecified: Secondary | ICD-10-CM | POA: Diagnosis not present

## 2015-12-30 DIAGNOSIS — F259 Schizoaffective disorder, unspecified: Secondary | ICD-10-CM | POA: Diagnosis not present

## 2015-12-31 DIAGNOSIS — F259 Schizoaffective disorder, unspecified: Secondary | ICD-10-CM | POA: Diagnosis not present

## 2016-01-01 DIAGNOSIS — F259 Schizoaffective disorder, unspecified: Secondary | ICD-10-CM | POA: Diagnosis not present

## 2016-01-02 DIAGNOSIS — F259 Schizoaffective disorder, unspecified: Secondary | ICD-10-CM | POA: Diagnosis not present

## 2016-01-03 DIAGNOSIS — F259 Schizoaffective disorder, unspecified: Secondary | ICD-10-CM | POA: Diagnosis not present

## 2016-01-04 DIAGNOSIS — F259 Schizoaffective disorder, unspecified: Secondary | ICD-10-CM | POA: Diagnosis not present

## 2016-01-05 DIAGNOSIS — F259 Schizoaffective disorder, unspecified: Secondary | ICD-10-CM | POA: Diagnosis not present

## 2016-01-05 DIAGNOSIS — J40 Bronchitis, not specified as acute or chronic: Secondary | ICD-10-CM | POA: Diagnosis not present

## 2016-01-05 DIAGNOSIS — R162 Hepatomegaly with splenomegaly, not elsewhere classified: Secondary | ICD-10-CM | POA: Diagnosis not present

## 2016-01-05 DIAGNOSIS — K469 Unspecified abdominal hernia without obstruction or gangrene: Secondary | ICD-10-CM | POA: Diagnosis not present

## 2016-01-05 DIAGNOSIS — R109 Unspecified abdominal pain: Secondary | ICD-10-CM | POA: Diagnosis not present

## 2016-01-05 DIAGNOSIS — K760 Fatty (change of) liver, not elsewhere classified: Secondary | ICD-10-CM | POA: Diagnosis not present

## 2016-01-06 DIAGNOSIS — F259 Schizoaffective disorder, unspecified: Secondary | ICD-10-CM | POA: Diagnosis not present

## 2016-01-07 DIAGNOSIS — F259 Schizoaffective disorder, unspecified: Secondary | ICD-10-CM | POA: Diagnosis not present

## 2016-01-08 DIAGNOSIS — F259 Schizoaffective disorder, unspecified: Secondary | ICD-10-CM | POA: Diagnosis not present

## 2016-01-09 DIAGNOSIS — F259 Schizoaffective disorder, unspecified: Secondary | ICD-10-CM | POA: Diagnosis not present

## 2016-01-10 DIAGNOSIS — F259 Schizoaffective disorder, unspecified: Secondary | ICD-10-CM | POA: Diagnosis not present

## 2016-01-11 DIAGNOSIS — F259 Schizoaffective disorder, unspecified: Secondary | ICD-10-CM | POA: Diagnosis not present

## 2016-01-12 DIAGNOSIS — F259 Schizoaffective disorder, unspecified: Secondary | ICD-10-CM | POA: Diagnosis not present

## 2016-01-13 DIAGNOSIS — F259 Schizoaffective disorder, unspecified: Secondary | ICD-10-CM | POA: Diagnosis not present

## 2016-01-17 DIAGNOSIS — F259 Schizoaffective disorder, unspecified: Secondary | ICD-10-CM | POA: Diagnosis not present

## 2016-01-18 DIAGNOSIS — F259 Schizoaffective disorder, unspecified: Secondary | ICD-10-CM | POA: Diagnosis not present

## 2016-01-19 DIAGNOSIS — F259 Schizoaffective disorder, unspecified: Secondary | ICD-10-CM | POA: Diagnosis not present

## 2016-01-20 DIAGNOSIS — R1310 Dysphagia, unspecified: Secondary | ICD-10-CM | POA: Diagnosis not present

## 2016-01-20 DIAGNOSIS — K219 Gastro-esophageal reflux disease without esophagitis: Secondary | ICD-10-CM | POA: Diagnosis not present

## 2016-01-20 DIAGNOSIS — K5900 Constipation, unspecified: Secondary | ICD-10-CM | POA: Diagnosis not present

## 2016-01-20 DIAGNOSIS — F259 Schizoaffective disorder, unspecified: Secondary | ICD-10-CM | POA: Diagnosis not present

## 2016-01-21 DIAGNOSIS — F259 Schizoaffective disorder, unspecified: Secondary | ICD-10-CM | POA: Diagnosis not present

## 2016-01-25 DIAGNOSIS — F259 Schizoaffective disorder, unspecified: Secondary | ICD-10-CM | POA: Diagnosis not present

## 2016-01-26 DIAGNOSIS — I10 Essential (primary) hypertension: Secondary | ICD-10-CM | POA: Diagnosis not present

## 2016-01-26 DIAGNOSIS — K219 Gastro-esophageal reflux disease without esophagitis: Secondary | ICD-10-CM | POA: Diagnosis not present

## 2016-01-26 DIAGNOSIS — G4733 Obstructive sleep apnea (adult) (pediatric): Secondary | ICD-10-CM | POA: Diagnosis not present

## 2016-01-26 DIAGNOSIS — E1165 Type 2 diabetes mellitus with hyperglycemia: Secondary | ICD-10-CM | POA: Diagnosis not present

## 2016-01-26 DIAGNOSIS — F259 Schizoaffective disorder, unspecified: Secondary | ICD-10-CM | POA: Diagnosis not present

## 2016-01-27 DIAGNOSIS — F259 Schizoaffective disorder, unspecified: Secondary | ICD-10-CM | POA: Diagnosis not present

## 2016-01-28 DIAGNOSIS — F259 Schizoaffective disorder, unspecified: Secondary | ICD-10-CM | POA: Diagnosis not present

## 2016-01-29 DIAGNOSIS — F259 Schizoaffective disorder, unspecified: Secondary | ICD-10-CM | POA: Diagnosis not present

## 2016-01-30 DIAGNOSIS — F259 Schizoaffective disorder, unspecified: Secondary | ICD-10-CM | POA: Diagnosis not present

## 2016-01-31 DIAGNOSIS — F259 Schizoaffective disorder, unspecified: Secondary | ICD-10-CM | POA: Diagnosis not present

## 2016-02-01 DIAGNOSIS — F259 Schizoaffective disorder, unspecified: Secondary | ICD-10-CM | POA: Diagnosis not present

## 2016-02-02 DIAGNOSIS — F259 Schizoaffective disorder, unspecified: Secondary | ICD-10-CM | POA: Diagnosis not present

## 2016-02-03 DIAGNOSIS — F259 Schizoaffective disorder, unspecified: Secondary | ICD-10-CM | POA: Diagnosis not present

## 2016-02-04 ENCOUNTER — Encounter (HOSPITAL_COMMUNITY): Payer: Self-pay

## 2016-02-04 ENCOUNTER — Emergency Department (HOSPITAL_COMMUNITY)
Admission: EM | Admit: 2016-02-04 | Discharge: 2016-02-05 | Disposition: A | Discharge status: Home / Self Care | Payer: Medicare HMO | Attending: Emergency Medicine | Admitting: Emergency Medicine

## 2016-02-04 ENCOUNTER — Emergency Department (HOSPITAL_COMMUNITY): Payer: Medicare HMO

## 2016-02-04 DIAGNOSIS — Z833 Family history of diabetes mellitus: Secondary | ICD-10-CM | POA: Diagnosis not present

## 2016-02-04 DIAGNOSIS — R079 Chest pain, unspecified: Secondary | ICD-10-CM | POA: Diagnosis not present

## 2016-02-04 DIAGNOSIS — F250 Schizoaffective disorder, bipolar type: Secondary | ICD-10-CM | POA: Diagnosis not present

## 2016-02-04 DIAGNOSIS — E1165 Type 2 diabetes mellitus with hyperglycemia: Secondary | ICD-10-CM | POA: Insufficient documentation

## 2016-02-04 DIAGNOSIS — R0602 Shortness of breath: Secondary | ICD-10-CM | POA: Diagnosis not present

## 2016-02-04 DIAGNOSIS — R9431 Abnormal electrocardiogram [ECG] [EKG]: Secondary | ICD-10-CM | POA: Insufficient documentation

## 2016-02-04 DIAGNOSIS — Z9889 Other specified postprocedural states: Secondary | ICD-10-CM | POA: Diagnosis not present

## 2016-02-04 DIAGNOSIS — R0781 Pleurodynia: Secondary | ICD-10-CM | POA: Diagnosis not present

## 2016-02-04 DIAGNOSIS — R7989 Other specified abnormal findings of blood chemistry: Secondary | ICD-10-CM | POA: Diagnosis not present

## 2016-02-04 DIAGNOSIS — Z9049 Acquired absence of other specified parts of digestive tract: Secondary | ICD-10-CM | POA: Diagnosis not present

## 2016-02-04 DIAGNOSIS — F315 Bipolar disorder, current episode depressed, severe, with psychotic features: Secondary | ICD-10-CM | POA: Diagnosis present

## 2016-02-04 DIAGNOSIS — R071 Chest pain on breathing: Secondary | ICD-10-CM | POA: Insufficient documentation

## 2016-02-04 DIAGNOSIS — I10 Essential (primary) hypertension: Secondary | ICD-10-CM | POA: Insufficient documentation

## 2016-02-04 DIAGNOSIS — R739 Hyperglycemia, unspecified: Secondary | ICD-10-CM

## 2016-02-04 LAB — CBC WITH DIFFERENTIAL/PLATELET
BASOS ABS: 0 10*3/uL (ref 0.0–0.1)
Basophils Relative: 0 %
Eosinophils Absolute: 0.1 10*3/uL (ref 0.0–0.7)
Eosinophils Relative: 3 %
HCT: 35.5 % — ABNORMAL LOW (ref 36.0–46.0)
HEMOGLOBIN: 11.6 g/dL — AB (ref 12.0–15.0)
Lymphocytes Relative: 22 %
Lymphs Abs: 1.2 10*3/uL (ref 0.7–4.0)
MCH: 27.4 pg (ref 26.0–34.0)
MCHC: 32.7 g/dL (ref 30.0–36.0)
MCV: 83.7 fL (ref 78.0–100.0)
Monocytes Absolute: 0.3 10*3/uL (ref 0.1–1.0)
Monocytes Relative: 5 %
NEUTROS ABS: 3.8 10*3/uL (ref 1.7–7.7)
NEUTROS PCT: 70 %
Platelets: 183 10*3/uL (ref 150–400)
RBC: 4.24 MIL/uL (ref 3.87–5.11)
RDW: 14.7 % (ref 11.5–15.5)
WBC: 5.3 10*3/uL (ref 4.0–10.5)

## 2016-02-04 LAB — BASIC METABOLIC PANEL
Anion gap: 14 (ref 5–15)
BUN: 8 mg/dL (ref 6–20)
CO2: 21 mmol/L — AB (ref 22–32)
Calcium: 9.4 mg/dL (ref 8.9–10.3)
Chloride: 101 mmol/L (ref 101–111)
Creatinine, Ser: 0.61 mg/dL (ref 0.44–1.00)
Glucose, Bld: 285 mg/dL — ABNORMAL HIGH (ref 65–99)
Potassium: 4.2 mmol/L (ref 3.5–5.1)
Sodium: 136 mmol/L (ref 135–145)

## 2016-02-04 LAB — I-STAT TROPONIN, ED: TROPONIN I, POC: 0 ng/mL (ref 0.00–0.08)

## 2016-02-04 LAB — CBG MONITORING, ED: Glucose-Capillary: 319 mg/dL — ABNORMAL HIGH (ref 65–99)

## 2016-02-04 LAB — D-DIMER, QUANTITATIVE: D-Dimer, Quant: <0.27 ug/mL-FEU (ref 0.00–0.50)

## 2016-02-04 MED ORDER — SERTRALINE HCL 50 MG PO TABS
200.0000 mg | ORAL_TABLET | Freq: Every day | ORAL | Status: DC
  Administered 2016-02-05: 200 mg via ORAL
  Filled 2016-02-04 (×2): qty 4

## 2016-02-04 MED ORDER — DICYCLOMINE HCL 20 MG PO TABS: 20.0000 mg | ORAL_TABLET | Freq: Three times a day (TID) | ORAL | Status: DC | PRN

## 2016-02-04 MED ORDER — GABAPENTIN 400 MG PO CAPS
800.0000 mg | ORAL_CAPSULE | Freq: Three times a day (TID) | ORAL | Status: DC
  Administered 2016-02-05 (×2): 800 mg via ORAL
  Filled 2016-02-04 (×2): qty 2

## 2016-02-04 MED ORDER — DIPHENHYDRAMINE HCL 50 MG/ML IJ SOLN
12.5000 mg | Freq: Once | INTRAMUSCULAR | Status: AC
  Administered 2016-02-04: 12.5 mg via INTRAVENOUS
  Filled 2016-02-04: qty 1

## 2016-02-04 MED ORDER — MORPHINE SULFATE (PF) 4 MG/ML IV SOLN
4.0000 mg | Freq: Once | INTRAVENOUS | Status: AC
  Administered 2016-02-04: 4 mg via INTRAVENOUS
  Filled 2016-02-04: qty 1

## 2016-02-04 MED ORDER — IBUPROFEN 400 MG PO TABS: 600.0000 mg | ORAL_TABLET | Freq: Three times a day (TID) | ORAL | Status: DC | PRN

## 2016-02-04 MED ORDER — BUSPIRONE HCL 10 MG PO TABS
15.0000 mg | ORAL_TABLET | Freq: Three times a day (TID) | ORAL | Status: DC
  Administered 2016-02-05 (×2): 15 mg via ORAL
  Filled 2016-02-04 (×2): qty 2

## 2016-02-04 MED ORDER — LISINOPRIL 10 MG PO TABS
10.0000 mg | ORAL_TABLET | Freq: Every day | ORAL | Status: DC
  Administered 2016-02-05: 10 mg via ORAL
  Filled 2016-02-04: qty 1

## 2016-02-04 MED ORDER — INSULIN ASPART 100 UNIT/ML ~~LOC~~ SOLN
8.0000 [IU] | SUBCUTANEOUS | Status: DC
  Administered 2016-02-05: 8 [IU] via SUBCUTANEOUS
  Filled 2016-02-04: qty 1

## 2016-02-04 MED ORDER — DOCUSATE SODIUM 100 MG PO CAPS
100.0000 mg | ORAL_CAPSULE | Freq: Two times a day (BID) | ORAL | Status: DC
  Administered 2016-02-05 (×2): 100 mg via ORAL
  Filled 2016-02-04 (×2): qty 1

## 2016-02-04 MED ORDER — MELOXICAM 7.5 MG PO TABS
7.5000 mg | ORAL_TABLET | Freq: Two times a day (BID) | ORAL | Status: DC
  Administered 2016-02-05 (×2): 7.5 mg via ORAL
  Filled 2016-02-04 (×2): qty 1

## 2016-02-04 MED ORDER — SIMVASTATIN 10 MG PO TABS
10.0000 mg | ORAL_TABLET | Freq: Every day | ORAL | Status: DC
  Administered 2016-02-05: 10 mg via ORAL
  Filled 2016-02-04: qty 1

## 2016-02-04 MED ORDER — CLONIDINE HCL 0.2 MG PO TABS
0.1000 mg | ORAL_TABLET | Freq: Two times a day (BID) | ORAL | Status: DC
  Administered 2016-02-05 (×2): 0.1 mg via ORAL
  Filled 2016-02-04 (×2): qty 1

## 2016-02-04 MED ORDER — PANTOPRAZOLE SODIUM 40 MG PO TBEC
40.0000 mg | DELAYED_RELEASE_TABLET | Freq: Every day | ORAL | Status: DC
  Administered 2016-02-05: 40 mg via ORAL
  Filled 2016-02-04: qty 1

## 2016-02-04 MED ORDER — SODIUM CHLORIDE 0.9 % IV BOLUS (SEPSIS)
1000.0000 mL | Freq: Once | INTRAVENOUS | Status: AC
  Administered 2016-02-04: 1000 mL via INTRAVENOUS

## 2016-02-04 MED ORDER — NORGESTIMATE-ETH ESTRADIOL 0.25-35 MG-MCG PO TABS: 1.0000 | ORAL_TABLET | Freq: Every day | ORAL | Status: DC

## 2016-02-04 MED ORDER — TRAZODONE HCL 50 MG PO TABS
25.0000 mg | ORAL_TABLET | Freq: Two times a day (BID) | ORAL | Status: DC
  Administered 2016-02-05 (×2): 25 mg via ORAL
  Filled 2016-02-04 (×2): qty 1

## 2016-02-04 MED ORDER — QUETIAPINE FUMARATE 200 MG PO TABS
800.0000 mg | ORAL_TABLET | Freq: Every day | ORAL | Status: DC
  Administered 2016-02-05: 800 mg via ORAL
  Filled 2016-02-04: qty 4

## 2016-02-04 MED ORDER — TRAMADOL HCL 50 MG PO TABS: 25.0000 mg | ORAL_TABLET | Freq: Two times a day (BID) | ORAL | Status: DC | PRN

## 2016-02-04 MED ORDER — ALBUTEROL SULFATE HFA 108 (90 BASE) MCG/ACT IN AERS
1.0000 | INHALATION_SPRAY | RESPIRATORY_TRACT | Status: DC | PRN
  Administered 2016-02-04: 1 via RESPIRATORY_TRACT
  Filled 2016-02-04: qty 6.7

## 2016-02-04 MED ORDER — TOPIRAMATE 25 MG PO TABS
25.0000 mg | ORAL_TABLET | Freq: Two times a day (BID) | ORAL | Status: DC
  Administered 2016-02-05 (×2): 25 mg via ORAL
  Filled 2016-02-04 (×2): qty 1

## 2016-02-04 MED ORDER — LORAZEPAM 2 MG/ML IJ SOLN
1.0000 mg | Freq: Once | INTRAMUSCULAR | Status: AC
  Administered 2016-02-04: 1 mg via INTRAVENOUS
  Filled 2016-02-04: qty 1

## 2016-02-04 MED ORDER — CYCLOBENZAPRINE HCL 10 MG PO TABS
5.0000 mg | ORAL_TABLET | Freq: Two times a day (BID) | ORAL | Status: DC
  Administered 2016-02-05 (×2): 5 mg via ORAL
  Filled 2016-02-04 (×2): qty 1

## 2016-02-04 NOTE — ED Notes (Signed)
ED Provider at bedside. 

## 2016-02-04 NOTE — ED Notes (Signed)
[place] Sandwich and drink provided.

## 2016-02-04 NOTE — ED Provider Notes (Signed)
  Physical Exam  BP 114/78   Pulse 114   Temp 98.5 F (36.9 C) (Oral)   Resp 22   Wt 111.6 kg   SpO2 99%   BMI 42.23 kg/m   Physical Exam  ED Course  Procedures  MDM 8:08 PM- Sign out from [facility], [place]  Per previous provider MDM- Afebrile nontoxic obese patient on oral contraceptives p/w left sided pleuritic chest pain.  She is very sedentary at group home.  No recent fevers or URI symptoms.  Labs reassuring including negative d-dimer and troponin, CXR negative. EKG with prolonged QT that is slightly improved from previous - pt advised to discuss this and review her medication list with her PCP or psychiatrist.    Will move to Pod C for TTS consult      Shary Decamp, PA-C 02/05/16 [place], MD 02/05/16 1316
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
## 2016-02-04 NOTE — ED Provider Notes (Signed)
[facility] DEPT Provider Note   CSN: [id number] Arrival date & time: 02/04/16  1654     History   Chief Complaint Chief Complaint  Patient presents with  . Chest Pain    HPI Tina Saunders is a 31 y.o. female.  HPI   Pt with hx anxiety, schizoaffective disorder, bipolar, obesity, rhabdosarcoma, diabetes p/w left sided "prickly" chest pain that began today, exacerbated with deep inspiration with associated SOB.  She is on oral birth control.  She also reports she is very sedentary and spends most of the day in bed watching her computer.  Denies fevers, recent URI symptoms, cough, hemoptysis, leg swelling.   Past Medical History:  Diagnosis Date  . Anxiety   . Bipolar 1 disorder ([place])   . Cancer of abdominal wall   . Depression   . Diabetes mellitus without complication ([place])   . Hypertension   . Obesity   . Obesity   . Polycystic ovarian syndrome 07/01/2011   Patient report  . 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 Schizophrenia [facility])     Patient Active Problem List   Diagnosis Date Noted  . Schizoaffective disorder, bipolar type ([place]) 05/30/2014  . Suicidal ideation   . 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 Headache 04/15/2013  . HTN (hypertension) 04/15/2013  . Bipolar disorder, current episode depressed, severe, with psychotic features ([place]) 12/07/2011  . Post traumatic stress disorder 12/07/2011  . CAP (community acquired pneumonia) 08/27/2011  . Chest pain 08/26/2011  . SOB (shortness of breath) 08/26/2011  . Fever 08/26/2011  . Hypokalemia 08/26/2011  . PSVT (paroxysmal supraventricular tachycardia) ([place]) 08/26/2011  . ADHD 09/23/2007  . EPIGASTRIC PAIN 09/23/2007  . Obesity, unspecified 07/30/2007  . DEPRESSION 07/30/2007  . SLEEP DISORDER 07/30/2007  . IMPAIRED FASTING GLUCOSE 07/30/2007  . FATIGUE 11/21/2006  . ABNORMAL FINDINGS, ELEVATED BP W/O HTN 11/21/2006  . METRORRHAGIA 06/13/2006  . DISORDER, MENSTRUAL NEC 06/13/2006  . DIZZINESS 06/13/2006  .  POLYCYSTIC OVARIAN DISEASE 04/25/2006  . AMENORRHEA, SECONDARY 04/20/2006  . ACNE, MILD 04/20/2006  . ABDOMINAL PAIN 04/20/2006    Past Surgical History:  Procedure Laterality Date  . CHOLECYSTECTOMY    . HERNIA REPAIR    . Ovarian Cyst Excision    . VARICOSE VEIN SURGERY      OB History    Gravida Para Term Preterm AB Living   0             SAB TAB Ectopic Multiple Live Births                   Home Medications    Prior to Admission medications   Medication Sig Start Date End Date Taking? Authorizing Provider  busPIRone (BUSPAR) 15 MG tablet Take 15 mg by mouth 3 (three) times daily.    Historical Provider, MD  clonazePAM (KLONOPIN) 0.5 MG tablet Take 1 tablet (0.5 mg total) by mouth 2 (two) times daily. 03/03/14   Drenda Freeze, MD  gabapentin (NEURONTIN) 400 MG capsule Take 2 capsules (800 mg total) by mouth at bedtime. Patient taking differently: Take 400-800 mg by mouth 4 (four) times daily. Take 400 mg three times daily; Take 800 mg at bedtime 05/24/14   Alvina Chou, PA-C  ibuprofen (ADVIL,MOTRIN) 800 MG tablet Take 1 tablet (800 mg total) by mouth every 8 (eight) hours as needed for moderate pain. 01/08/15   Domenic Moras, PA-C  Melatonin 3 MG TABS Take 3 mg by mouth at bedtime as needed (for  sleep).     Historical Provider, MD  norgestimate-ethinyl estradiol (ORTHO-CYCLEN,SPRINTEC,PREVIFEM) 0.25-35 MG-MCG tablet Take 1 tablet by mouth daily. 11/24/15   Tanna Savoy Stinson, DO  pantoprazole (PROTONIX) 40 MG tablet Take 40 mg by mouth at bedtime.    Historical Provider, MD  QUEtiapine (SEROQUEL XR) 400 MG 24 hr tablet Take 800 mg by mouth at bedtime.    Historical Provider, MD  sertraline (ZOLOFT) 50 MG tablet Take 150 mg by mouth at bedtime. 05/01/14   Historical Provider, MD    Family History Family History  Problem Relation Age of Onset  . Coronary artery disease Maternal Grandmother   . Diabetes type II Maternal Grandmother   . Cancer Maternal Grandmother   .  Hypertension Mother   . Hypertension Father     Social History Social History  Substance Use Topics  . Smoking status: Never Smoker  . Smokeless tobacco: Never Used  . Alcohol use No     Allergies   Fish-derived products; Geodon [ziprasidone hcl]; Haldol [haloperidol lactate]; Compazine [prochlorperazine]; Toradol [ketorolac tromethamine]; and Buprenorphine hcl   Review of Systems Review of Systems  Gastrointestinal:       Right sided abdominal soreness x 1 week  Genitourinary:       Chronic urinary incontinence, unchanged  All other systems reviewed and are negative.    Physical Exam Updated Vital Signs BP 114/78   Pulse 114   Temp 98.5 F (36.9 C) (Oral)   Resp 22   Wt 111.6 kg   SpO2 99%   BMI 42.23 kg/m   Physical Exam  Constitutional: She appears well-developed and well-nourished. No distress.  HENT:  Head: Normocephalic and atraumatic.  Neck: Neck supple.  Cardiovascular: Normal rate and regular rhythm.   Pulmonary/Chest: Effort normal and breath sounds normal. No respiratory distress. She has no wheezes. She has no rales.  Abdominal: Soft. She exhibits no distension. There is no tenderness. There is no rebound and no guarding.  Neurological: She is alert.  Skin: She is not diaphoretic.  Psychiatric: She expresses no homicidal and no suicidal ideation. She expresses no suicidal plans and no homicidal plans.  Flat affect   Nursing note and vitals reviewed.    ED Treatments / Results  Labs (all labs ordered are listed, but only abnormal results are displayed) Labs Reviewed  BASIC METABOLIC PANEL - Abnormal; Notable for the following:       Result Value   CO2 21 (*)    Glucose, Bld 285 (*)    All other components within normal limits  CBC WITH DIFFERENTIAL/PLATELET - Abnormal; Notable for the following:    Hemoglobin 11.6 (*)    HCT 35.5 (*)    All other components within normal limits  D-DIMER, QUANTITATIVE (NOT AT [facility])  Randolm Idol, ED      EKG  EKG Interpretation None       Radiology Dg Chest 2 View  Result Date: 02/04/2016 CLINICAL DATA:  Left chest pain and shortness of breath EXAM: CHEST  2 VIEW COMPARISON:  11/12/2015 FINDINGS: Low lung volumes are present, causing crowding of the pulmonary vasculature. Accounting for the low lung volumes, there is borderline enlargement of the cardiopericardial silhouette. No edema. Linear subsegmental atelectasis in the lingula. No pleural effusion. IMPRESSION: 1. Borderline enlargement of the cardiopericardial silhouette. Some of the cardiac prominence is due to the low lung volumes. 2. Subsegmental atelectasis or scarring in the lingula. Electronically Signed   By: Van Clines M.D.   On: 02/04/2016  18:16    Procedures Procedures (including critical care time)  Medications Ordered in ED Medications  LORazepam (ATIVAN) injection 1 mg (not administered)  sodium chloride 0.9 % bolus 1,000 mL (1,000 mLs Intravenous New Bag/Given 02/04/16 1902)  morphine 4 MG/ML injection 4 mg (4 mg Intravenous Given 02/04/16 1903)  diphenhydrAMINE (BENADRYL) injection 12.5 mg (12.5 mg Intravenous Given 02/04/16 1903)     Initial Impression / Assessment and Plan / ED Course  I have reviewed the triage vital signs and the nursing notes.  Pertinent labs & imaging results that were available during my care of the patient were reviewed by me and considered in my medical decision making (see chart for details).  Clinical Course as of Feb 03 2006  Fri Feb 04, 2016  1840 Pt does admit to me that she wants to hurt herself by cutting herself.  She denies SI. Denies HI.  Does admit to depression.  States she thinks she might want to cut herself because of her chest pain.  Has hx cutting once previously and was admitted at that time.  States she has chronic auditory and visual hallucinations that are unchanged. No command hallucinations.    [EW]  1950 Pt now states the pain in her chest feels like  anxiety and gets worse when she thinks about things that bother her.  Workup otherwise reassuring.  Pain has been constant (waxing and waning) since 10am when she woke up.   Will treat anxiety.  Awaiting TTS consult.    [EW]    Clinical Course User Index [EW] Clayton Bibles, PA-C   Afebrile nontoxic obese patient on oral contraceptives p/w left sided pleuritic chest pain.  She is very sedentary at group home.  No recent fevers or URI symptoms.  Labs reassuring including negative d-dimer and troponin, CXR negative. EKG with prolonged QT that is slightly improved from previous - pt advised to discuss this and review her medication list with her PCP or psychiatrist.   Pt signed out to Shary Decamp, PA-C, pending TTS assessment.  Pt is voluntary.  Dispo per plan with TTS.    Final Clinical Impressions(s) / ED Diagnoses   Final diagnoses:  Chest pain on breathing  Prolonged Q-T interval on ECG  Hyperglycemia    New Prescriptions New Prescriptions   No medications on file     Clayton Bibles, PA-C 02/04/16 2010    Daleen Bo, MD 02/05/16 1316

## 2016-02-04 NOTE — ED Triage Notes (Signed)
Pt BIB [facility] from [facility] in  where the pt reported to the facility that she was having CP at rest with some SOB and nausea with some tingling in left arm. Pt also expressed to facility and EMS that when her chest pain started she was having thoughts of hurting herself. When this writer asked her about those thoughts pt states "i thought about cutting myself, I don't want to kill myself or nothing, I am not that selfish."  324 of aspirin given by facility.

## 2016-02-05 DIAGNOSIS — Z808 Family history of malignant neoplasm of other organs or systems: Secondary | ICD-10-CM

## 2016-02-05 DIAGNOSIS — Z8249 Family history of ischemic heart disease and other diseases of the circulatory system: Secondary | ICD-10-CM

## 2016-02-05 DIAGNOSIS — Z9049 Acquired absence of other specified parts of digestive tract: Secondary | ICD-10-CM | POA: Diagnosis not present

## 2016-02-05 DIAGNOSIS — Z833 Family history of diabetes mellitus: Secondary | ICD-10-CM | POA: Diagnosis not present

## 2016-02-05 DIAGNOSIS — F259 Schizoaffective disorder, unspecified: Secondary | ICD-10-CM | POA: Diagnosis not present

## 2016-02-05 DIAGNOSIS — Z91013 Allergy to seafood: Secondary | ICD-10-CM

## 2016-02-05 DIAGNOSIS — F250 Schizoaffective disorder, bipolar type: Secondary | ICD-10-CM

## 2016-02-05 DIAGNOSIS — Z9889 Other specified postprocedural states: Secondary | ICD-10-CM

## 2016-02-05 DIAGNOSIS — R071 Chest pain on breathing: Secondary | ICD-10-CM | POA: Diagnosis not present

## 2016-02-05 DIAGNOSIS — Z888 Allergy status to other drugs, medicaments and biological substances status: Secondary | ICD-10-CM

## 2016-02-05 DIAGNOSIS — Z79899 Other long term (current) drug therapy: Secondary | ICD-10-CM

## 2016-02-05 DIAGNOSIS — Z794 Long term (current) use of insulin: Secondary | ICD-10-CM

## 2016-02-05 LAB — CBG MONITORING, ED: GLUCOSE-CAPILLARY: 288 mg/dL — AB (ref 65–99)

## 2016-02-05 MED ORDER — NORGESTIMATE-ETH ESTRADIOL 0.25-35 MG-MCG PO TABS: 1.0000 | ORAL_TABLET | Freq: Every day | ORAL | Status: DC

## 2016-02-05 NOTE — ED Notes (Signed)
Pt states came to ED d/t had panic attack at nursing home and they did not have any meds they could give her ordered.

## 2016-02-05 NOTE — Progress Notes (Signed)
Patient has been recommended discharge, per FNP Catalina Pizza.  [facility]-ED RN Jacqlyn Larsen has been informed.  Verlon Setting, [place] Disposition staff 02/05/2016 11:58 AM

## 2016-02-05 NOTE — ED Notes (Signed)
Pt lying on bed, alert. Aware her brother called and is requesting she call him back Yong Channel [phone]. Advised her she could put her pants on and call him back from phone at nurses' desk.

## 2016-02-05 NOTE — ED Notes (Signed)
This RN walked in the room and the pt had gotten undressed and was sitting in the chair in her room. When asked what she was doing pt stated "I am waiting on an important phone call"  This RN is currently waiting for TTS to evaluate pt.  The previous shift nurse mentioned to me that pt had made statements about wanting to cut herself, not to kill herself but that she just liked cutting.

## 2016-02-05 NOTE — ED Notes (Signed)
Pharmacy called to say we do not carry her birthcontrol. Pharmacy will call later to see if she is staying her or going home.

## 2016-02-05 NOTE — ED Notes (Signed)
Pt describes chest pain as a needle jabbing into her heart.

## 2016-02-05 NOTE — ED Notes (Signed)
Pt at nurses' desk calling brother, Yong Channel, back d/t states was in shower when she attempted to call him back at 1135.

## 2016-02-05 NOTE — ED Notes (Signed)
Breakfast ordered 

## 2016-02-05 NOTE — ED Notes (Signed)
Pt aware Tina Saunders [place] [id number] - advised she is coming to ED to pick up pt. States she will be leaving facility in approx 30 min so should be arriving in approx 1 hour. Pt also aware Mariann Laster advised she will bring her some clothing as requested.

## 2016-02-05 NOTE — ED Notes (Signed)
Mariann Laster [facility], arrived to pick up pt. Brought pt clothing to change into.

## 2016-02-05 NOTE — ED Notes (Addendum)
Pt's Med Tech called from [facility] - aware plan is for pt to be d/c'd - waiting for documentation from [place], [place], [facility] and [place]. Advised she will request for her superior to call to advise RN of transportation. Pt aware of tx plan and is in agreement.

## 2016-02-05 NOTE — ED Notes (Signed)
Pt aware of delay w/disposition.
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
## 2016-02-05 NOTE — ED Notes (Signed)
Telepsych being performed. 

## 2016-02-05 NOTE — ED Notes (Signed)
Patient was given a snack and drink. A regular diet was ordered for Lunch.

## 2016-02-06 DIAGNOSIS — F259 Schizoaffective disorder, unspecified: Secondary | ICD-10-CM | POA: Diagnosis not present

## 2016-02-07 DIAGNOSIS — F259 Schizoaffective disorder, unspecified: Secondary | ICD-10-CM | POA: Diagnosis not present

## 2016-02-08 DIAGNOSIS — F259 Schizoaffective disorder, unspecified: Secondary | ICD-10-CM | POA: Diagnosis not present

## 2016-02-08 DIAGNOSIS — F419 Anxiety disorder, unspecified: Secondary | ICD-10-CM | POA: Diagnosis not present

## 2016-02-08 DIAGNOSIS — F339 Major depressive disorder, recurrent, unspecified: Secondary | ICD-10-CM | POA: Diagnosis not present

## 2016-02-08 DIAGNOSIS — F250 Schizoaffective disorder, bipolar type: Secondary | ICD-10-CM | POA: Diagnosis not present

## 2016-02-08 DIAGNOSIS — G4700 Insomnia, unspecified: Secondary | ICD-10-CM | POA: Diagnosis not present

## 2016-02-09 DIAGNOSIS — F259 Schizoaffective disorder, unspecified: Secondary | ICD-10-CM | POA: Diagnosis not present

## 2016-02-10 DIAGNOSIS — F259 Schizoaffective disorder, unspecified: Secondary | ICD-10-CM | POA: Diagnosis not present

## 2016-02-11 DIAGNOSIS — F259 Schizoaffective disorder, unspecified: Secondary | ICD-10-CM | POA: Diagnosis not present

## 2016-02-12 DIAGNOSIS — F259 Schizoaffective disorder, unspecified: Secondary | ICD-10-CM | POA: Diagnosis not present

## 2016-02-13 DIAGNOSIS — F259 Schizoaffective disorder, unspecified: Secondary | ICD-10-CM | POA: Diagnosis not present

## 2016-02-14 DIAGNOSIS — F259 Schizoaffective disorder, unspecified: Secondary | ICD-10-CM | POA: Diagnosis not present

## 2016-02-14 DIAGNOSIS — R5383 Other fatigue: Secondary | ICD-10-CM | POA: Diagnosis not present

## 2016-02-14 DIAGNOSIS — G4733 Obstructive sleep apnea (adult) (pediatric): Secondary | ICD-10-CM | POA: Diagnosis not present

## 2016-02-14 DIAGNOSIS — R0600 Dyspnea, unspecified: Secondary | ICD-10-CM | POA: Diagnosis not present

## 2016-02-15 DIAGNOSIS — F259 Schizoaffective disorder, unspecified: Secondary | ICD-10-CM | POA: Diagnosis not present

## 2016-02-16 DIAGNOSIS — F259 Schizoaffective disorder, unspecified: Secondary | ICD-10-CM | POA: Diagnosis not present

## 2016-02-17 DIAGNOSIS — F259 Schizoaffective disorder, unspecified: Secondary | ICD-10-CM | POA: Diagnosis not present

## 2016-02-18 DIAGNOSIS — F259 Schizoaffective disorder, unspecified: Secondary | ICD-10-CM | POA: Diagnosis not present

## 2016-02-19 DIAGNOSIS — F259 Schizoaffective disorder, unspecified: Secondary | ICD-10-CM | POA: Diagnosis not present

## 2016-02-20 DIAGNOSIS — F259 Schizoaffective disorder, unspecified: Secondary | ICD-10-CM | POA: Diagnosis not present

## 2016-02-21 DIAGNOSIS — F259 Schizoaffective disorder, unspecified: Secondary | ICD-10-CM | POA: Diagnosis not present

## 2016-02-22 DIAGNOSIS — K2970 Gastritis, unspecified, without bleeding: Secondary | ICD-10-CM | POA: Diagnosis not present

## 2016-02-22 DIAGNOSIS — K219 Gastro-esophageal reflux disease without esophagitis: Secondary | ICD-10-CM | POA: Diagnosis not present

## 2016-02-22 DIAGNOSIS — F319 Bipolar disorder, unspecified: Secondary | ICD-10-CM | POA: Diagnosis not present

## 2016-02-22 DIAGNOSIS — F209 Schizophrenia, unspecified: Secondary | ICD-10-CM | POA: Diagnosis not present

## 2016-02-22 DIAGNOSIS — C499 Malignant neoplasm of connective and soft tissue, unspecified: Secondary | ICD-10-CM | POA: Diagnosis not present

## 2016-02-22 DIAGNOSIS — F259 Schizoaffective disorder, unspecified: Secondary | ICD-10-CM | POA: Diagnosis not present

## 2016-02-22 DIAGNOSIS — K208 Other esophagitis: Secondary | ICD-10-CM | POA: Diagnosis not present

## 2016-02-22 DIAGNOSIS — E7800 Pure hypercholesterolemia, unspecified: Secondary | ICD-10-CM | POA: Diagnosis not present

## 2016-02-22 DIAGNOSIS — K228 Other specified diseases of esophagus: Secondary | ICD-10-CM | POA: Diagnosis not present

## 2016-02-22 DIAGNOSIS — R1310 Dysphagia, unspecified: Secondary | ICD-10-CM | POA: Diagnosis not present

## 2016-02-22 DIAGNOSIS — K222 Esophageal obstruction: Secondary | ICD-10-CM | POA: Diagnosis not present

## 2016-02-22 DIAGNOSIS — E119 Type 2 diabetes mellitus without complications: Secondary | ICD-10-CM | POA: Diagnosis not present

## 2016-02-22 DIAGNOSIS — I10 Essential (primary) hypertension: Secondary | ICD-10-CM | POA: Diagnosis not present

## 2016-02-23 DIAGNOSIS — F259 Schizoaffective disorder, unspecified: Secondary | ICD-10-CM | POA: Diagnosis not present

## 2016-02-24 DIAGNOSIS — F259 Schizoaffective disorder, unspecified: Secondary | ICD-10-CM | POA: Diagnosis not present

## 2016-02-25 DIAGNOSIS — F259 Schizoaffective disorder, unspecified: Secondary | ICD-10-CM | POA: Diagnosis not present

## 2016-02-26 DIAGNOSIS — F259 Schizoaffective disorder, unspecified: Secondary | ICD-10-CM | POA: Diagnosis not present

## 2016-02-27 DIAGNOSIS — F259 Schizoaffective disorder, unspecified: Secondary | ICD-10-CM | POA: Diagnosis not present

## 2016-02-28 DIAGNOSIS — F259 Schizoaffective disorder, unspecified: Secondary | ICD-10-CM | POA: Diagnosis not present

## 2016-02-28 DIAGNOSIS — G4733 Obstructive sleep apnea (adult) (pediatric): Secondary | ICD-10-CM | POA: Diagnosis not present

## 2016-02-29 DIAGNOSIS — E559 Vitamin D deficiency, unspecified: Secondary | ICD-10-CM | POA: Diagnosis not present

## 2016-02-29 DIAGNOSIS — E119 Type 2 diabetes mellitus without complications: Secondary | ICD-10-CM | POA: Diagnosis not present

## 2016-02-29 DIAGNOSIS — D518 Other vitamin B12 deficiency anemias: Secondary | ICD-10-CM | POA: Diagnosis not present

## 2016-02-29 DIAGNOSIS — E038 Other specified hypothyroidism: Secondary | ICD-10-CM | POA: Diagnosis not present

## 2016-02-29 DIAGNOSIS — E782 Mixed hyperlipidemia: Secondary | ICD-10-CM | POA: Diagnosis not present

## 2016-02-29 DIAGNOSIS — Z79899 Other long term (current) drug therapy: Secondary | ICD-10-CM | POA: Diagnosis not present

## 2016-03-03 DIAGNOSIS — F259 Schizoaffective disorder, unspecified: Secondary | ICD-10-CM | POA: Diagnosis not present

## 2016-03-04 DIAGNOSIS — F259 Schizoaffective disorder, unspecified: Secondary | ICD-10-CM | POA: Diagnosis not present

## 2016-03-05 DIAGNOSIS — F259 Schizoaffective disorder, unspecified: Secondary | ICD-10-CM | POA: Diagnosis not present

## 2016-03-06 DIAGNOSIS — F259 Schizoaffective disorder, unspecified: Secondary | ICD-10-CM | POA: Diagnosis not present

## 2016-03-07 ENCOUNTER — Emergency Department (HOSPITAL_COMMUNITY): Payer: Medicare HMO

## 2016-03-07 ENCOUNTER — Encounter (HOSPITAL_COMMUNITY): Payer: Self-pay | Admitting: Emergency Medicine

## 2016-03-07 ENCOUNTER — Emergency Department (HOSPITAL_COMMUNITY)
Admission: EM | Admit: 2016-03-07 | Discharge: 2016-03-08 | Disposition: A | Discharge status: Home / Self Care | Payer: Medicare HMO | Attending: Emergency Medicine | Admitting: Emergency Medicine

## 2016-03-07 DIAGNOSIS — N390 Urinary tract infection, site not specified: Secondary | ICD-10-CM | POA: Diagnosis not present

## 2016-03-07 DIAGNOSIS — D518 Other vitamin B12 deficiency anemias: Secondary | ICD-10-CM | POA: Diagnosis not present

## 2016-03-07 DIAGNOSIS — Z794 Long term (current) use of insulin: Secondary | ICD-10-CM | POA: Insufficient documentation

## 2016-03-07 DIAGNOSIS — Z79899 Other long term (current) drug therapy: Secondary | ICD-10-CM | POA: Insufficient documentation

## 2016-03-07 DIAGNOSIS — F909 Attention-deficit hyperactivity disorder, unspecified type: Secondary | ICD-10-CM | POA: Diagnosis not present

## 2016-03-07 DIAGNOSIS — R739 Hyperglycemia, unspecified: Secondary | ICD-10-CM

## 2016-03-07 DIAGNOSIS — E1165 Type 2 diabetes mellitus with hyperglycemia: Secondary | ICD-10-CM | POA: Diagnosis not present

## 2016-03-07 DIAGNOSIS — I10 Essential (primary) hypertension: Secondary | ICD-10-CM | POA: Insufficient documentation

## 2016-03-07 DIAGNOSIS — R10814 Left lower quadrant abdominal tenderness: Secondary | ICD-10-CM | POA: Diagnosis not present

## 2016-03-07 DIAGNOSIS — R1031 Right lower quadrant pain: Secondary | ICD-10-CM

## 2016-03-07 DIAGNOSIS — E782 Mixed hyperlipidemia: Secondary | ICD-10-CM | POA: Diagnosis not present

## 2016-03-07 DIAGNOSIS — R404 Transient alteration of awareness: Secondary | ICD-10-CM | POA: Diagnosis not present

## 2016-03-07 DIAGNOSIS — F259 Schizoaffective disorder, unspecified: Secondary | ICD-10-CM | POA: Diagnosis not present

## 2016-03-07 DIAGNOSIS — E119 Type 2 diabetes mellitus without complications: Secondary | ICD-10-CM | POA: Diagnosis not present

## 2016-03-07 DIAGNOSIS — R195 Other fecal abnormalities: Secondary | ICD-10-CM | POA: Diagnosis not present

## 2016-03-07 LAB — COMPREHENSIVE METABOLIC PANEL
ALT: 36 U/L (ref 14–54)
ANION GAP: 11 (ref 5–15)
AST: 78 U/L — ABNORMAL HIGH (ref 15–41)
Albumin: 3.5 g/dL (ref 3.5–5.0)
Alkaline Phosphatase: 94 U/L (ref 38–126)
BUN: 6 mg/dL (ref 6–20)
CO2: 21 mmol/L — AB (ref 22–32)
Calcium: 9.1 mg/dL (ref 8.9–10.3)
Chloride: 101 mmol/L (ref 101–111)
Creatinine, Ser: 0.63 mg/dL (ref 0.44–1.00)
GFR calc non Af Amer: >60 mL/min (ref >60)
Glucose, Bld: 439 mg/dL — ABNORMAL HIGH (ref 65–99)
Potassium: 4.1 mmol/L (ref 3.5–5.1)
SODIUM: 133 mmol/L — AB (ref 135–145)
Total Bilirubin: 0.5 mg/dL (ref 0.3–1.2)
Total Protein: 7 g/dL (ref 6.5–8.1)

## 2016-03-07 LAB — I-STAT BETA HCG BLOOD, ED (MC, WL, AP ONLY)

## 2016-03-07 LAB — I-STAT VENOUS BLOOD GAS, ED
Acid-base deficit: 4 mmol/L — ABNORMAL HIGH (ref 0.0–2.0)
Bicarbonate: 20.3 mmol/L (ref 20.0–28.0)
O2 Saturation: 95 %
PCO2 VEN: 33.2 mmHg — AB (ref 44.0–60.0)
PH VEN: 7.395 (ref 7.250–7.430)
PO2 VEN: 77 mmHg — AB (ref 32.0–45.0)
TCO2: 21 mmol/L (ref 0–100)

## 2016-03-07 LAB — CBC
HCT: 34.4 % — ABNORMAL LOW (ref 36.0–46.0)
HEMOGLOBIN: 11.1 g/dL — AB (ref 12.0–15.0)
MCH: 27.1 pg (ref 26.0–34.0)
MCHC: 32.3 g/dL (ref 30.0–36.0)
MCV: 83.9 fL (ref 78.0–100.0)
Platelets: 162 10*3/uL (ref 150–400)
RBC: 4.1 MIL/uL (ref 3.87–5.11)
RDW: 14.6 % (ref 11.5–15.5)
WBC: 6.1 10*3/uL (ref 4.0–10.5)

## 2016-03-07 LAB — CBG MONITORING, ED
GLUCOSE-CAPILLARY: 403 mg/dL — AB (ref 65–99)
GLUCOSE-CAPILLARY: 404 mg/dL — AB (ref 65–99)
GLUCOSE-CAPILLARY: 273 mg/dL — AB (ref 65–99)

## 2016-03-07 LAB — I-STAT CREATININE, ED: Creatinine, Ser: 0.5 mg/dL (ref 0.44–1.00)

## 2016-03-07 MED ORDER — IOPAMIDOL (ISOVUE-300) INJECTION 61%
INTRAVENOUS | Status: AC
  Administered 2016-03-07: 100 mL
  Filled 2016-03-07: qty 100

## 2016-03-07 MED ORDER — SODIUM CHLORIDE 0.9 % IV BOLUS (SEPSIS)
1000.0000 mL | Freq: Once | INTRAVENOUS | Status: AC
  Administered 2016-03-07: 1000 mL via INTRAVENOUS

## 2016-03-07 MED ORDER — INSULIN ASPART 100 UNIT/ML ~~LOC~~ SOLN
10.0000 [IU] | Freq: Once | SUBCUTANEOUS | Status: AC
Start: 2016-03-07 — End: 2016-03-07
  Administered 2016-03-07: 10 [IU] via INTRAVENOUS
  Filled 2016-03-07: qty 1

## 2016-03-07 MED ORDER — FENTANYL CITRATE (PF) 100 MCG/2ML IJ SOLN
50.0000 ug | Freq: Once | INTRAMUSCULAR | Status: AC
  Administered 2016-03-07: 50 ug via INTRAVENOUS
  Filled 2016-03-07: qty 2

## 2016-03-07 MED ORDER — FENTANYL CITRATE (PF) 100 MCG/2ML IJ SOLN
25.0000 ug | Freq: Once | INTRAMUSCULAR | Status: AC
  Administered 2016-03-07: 25 ug via INTRAVENOUS
  Filled 2016-03-07: qty 2

## 2016-03-07 NOTE — ED Notes (Signed)
Patient transported to CT with CT tech 

## 2016-03-07 NOTE — ED Triage Notes (Signed)
Pt presents to ER from [facility] in [place] for hyperglycemia and AMS; initial CBG was 488, repeat by EMS was 542; Staff say pt took 10 units novolog today but refused other antidiabetic medications; pt also c/o RLQ rebound tenderness x 2 days

## 2016-03-07 NOTE — ED Provider Notes (Signed)
[facility] DEPT Provider Note   CSN: [id number] Arrival date & time: 03/07/16  2050     History   Chief Complaint Chief Complaint  Patient presents with  . Hyperglycemia  . Hyperventilating  . Altered Mental Status    HPI Tina Saunders is a 31 y.o. female.  HPI  Patient presents with concern of nausea, right lower quadrant abdominal pain, generalized discomfort, fever. Patient is a resident of a group living facility. Illness at the present for about 2 days, without clear precipitant. Since onset symptoms of been progressive. Pain is focally in the right lower quadrant, nonradiating, sore, severe. When asked how high the patient's blood sugar has been she states that it has been 100 but she is concerned about its elevation.  Nursing home report shows blood value from earlier today at 437. Patient also had negative C. difficile PCR test results today.   Level V caveat secondary to psychiatric disease  Past Medical History:  Diagnosis Date  . Anxiety   . Bipolar 1 disorder ([place])   . Cancer of abdominal wall   . Depression   . Diabetes mellitus without complication ([place])   . Hypertension   . Obesity   . Obesity   . Polycystic ovarian syndrome 07/01/2011   Patient report  . 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 Schizophrenia [facility])     Patient Active Problem List   Diagnosis Date Noted  . Schizoaffective disorder, bipolar type ([place]) 05/30/2014  . Suicidal ideation   . 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 Headache 04/15/2013  . HTN (hypertension) 04/15/2013  . Post traumatic stress disorder 12/07/2011  . CAP (community acquired pneumonia) 08/27/2011  . Chest pain 08/26/2011  . SOB (shortness of breath) 08/26/2011  . Fever 08/26/2011  . Hypokalemia 08/26/2011  . PSVT (paroxysmal supraventricular tachycardia) ([place]) 08/26/2011  . ADHD 09/23/2007  . EPIGASTRIC PAIN 09/23/2007  . Obesity, unspecified 07/30/2007  . DEPRESSION 07/30/2007  . SLEEP DISORDER 07/30/2007  .  IMPAIRED FASTING GLUCOSE 07/30/2007  . FATIGUE 11/21/2006  . ABNORMAL FINDINGS, ELEVATED BP W/O HTN 11/21/2006  . METRORRHAGIA 06/13/2006  . DISORDER, MENSTRUAL NEC 06/13/2006  . DIZZINESS 06/13/2006  . POLYCYSTIC OVARIAN DISEASE 04/25/2006  . AMENORRHEA, SECONDARY 04/20/2006  . ACNE, MILD 04/20/2006  . ABDOMINAL PAIN 04/20/2006    Past Surgical History:  Procedure Laterality Date  . CHOLECYSTECTOMY    . HERNIA REPAIR    . Ovarian Cyst Excision    . VARICOSE VEIN SURGERY      OB History    Gravida Para Term Preterm AB Living   0             SAB TAB Ectopic Multiple Live Births                   Home Medications    Prior to Admission medications   Medication Sig Start Date End Date Taking? Authorizing Provider  albuterol (PROVENTIL HFA;VENTOLIN HFA) 108 (90 Base) MCG/ACT inhaler Inhale 1 puff into the lungs every 4 (four) hours as needed for wheezing or shortness of breath.    Historical Provider, MD  busPIRone (BUSPAR) 15 MG tablet Take 15 mg by mouth 3 (three) times daily.    Historical Provider, MD  chlorproMAZINE (THORAZINE) 25 MG tablet Take 25 mg by mouth 3 (three) times daily.    Historical Provider, MD  clonazePAM (KLONOPIN) 0.5 MG tablet Take 1 tablet (0.5 mg total) by mouth 2 (two) times daily. Patient not taking: Reported on  02/04/2016 03/03/14   Drenda Freeze, MD  cloNIDine (CATAPRES) 0.1 MG tablet Take 0.1 mg by mouth 2 (two) times daily.    Historical Provider, MD  cyclobenzaprine (FLEXERIL) 5 MG tablet Take 5 mg by mouth 2 (two) times daily.    Historical Provider, MD  dicyclomine (BENTYL) 20 MG tablet Take 20 mg by mouth every 8 (eight) hours as needed for spasms.    Historical Provider, MD  docusate sodium (COLACE) 100 MG capsule Take 100 mg by mouth 2 (two) times daily.    Historical Provider, MD  fenofibrate (TRICOR) 145 MG tablet Take 145 mg by mouth daily.    Historical Provider, MD  gabapentin (NEURONTIN) 400 MG capsule Take 2 capsules (800 mg total)  by mouth at bedtime. Patient not taking: Reported on 02/04/2016 05/24/14   Alvina Chou, PA-C  gabapentin (NEURONTIN) 800 MG tablet Take 800 mg by mouth 3 (three) times daily.    Historical Provider, MD  ibuprofen (ADVIL,MOTRIN) 600 MG tablet Take 600 mg by mouth every 8 (eight) hours as needed for mild pain.    Historical Provider, MD  ibuprofen (ADVIL,MOTRIN) 800 MG tablet Take 1 tablet (800 mg total) by mouth every 8 (eight) hours as needed for moderate pain. Patient not taking: Reported on 02/04/2016 01/08/15   Domenic Moras, PA-C  insulin aspart (NOVOLOG) 100 UNIT/ML injection Inject 8-10 Units into the skin See admin instructions. Use 8 units before breakfast then use 10 units before Lunch, Dinner and at bedtime    Historical Provider, MD  lactulose (CHRONULAC) 10 GM/15ML solution Take 20 g by mouth 2 (two) times daily.    Historical Provider, MD  lisinopril (PRINIVIL,ZESTRIL) 10 MG tablet Take 10 mg by mouth daily.    Historical Provider, MD  meloxicam (MOBIC) 7.5 MG tablet Take 7.5 mg by mouth 2 (two) times daily.    Historical Provider, MD  norgestimate-ethinyl estradiol (ORTHO-CYCLEN,SPRINTEC,PREVIFEM) 0.25-35 MG-MCG tablet Take 1 tablet by mouth daily. 11/24/15   Tanna Savoy Stinson, DO  pantoprazole (PROTONIX) 40 MG tablet Take 40 mg by mouth at bedtime.    Historical Provider, MD  penicillin v potassium (VEETID) 500 MG tablet Take 500 mg by mouth 3 (three) times daily.    Historical Provider, MD  polyethylene glycol (MIRALAX / GLYCOLAX) packet Take 17 g by mouth daily.    Historical Provider, MD  QUEtiapine (SEROQUEL) 400 MG tablet Take 800 mg by mouth at bedtime.    Historical Provider, MD  sertraline (ZOLOFT) 100 MG tablet Take 200 mg by mouth daily.    Historical Provider, MD  simvastatin (ZOCOR) 10 MG tablet Take 10 mg by mouth at bedtime.    Historical Provider, MD  topiramate (TOPAMAX) 25 MG tablet Take 25 mg by mouth 2 (two) times daily.    Historical Provider, MD  traMADol (ULTRAM) 50  MG tablet Take 25 mg by mouth every 12 (twelve) hours as needed for moderate pain.    Historical Provider, MD  traZODone (DESYREL) 50 MG tablet Take 25 mg by mouth 2 (two) times daily.    Historical Provider, MD  Vitamin D, Ergocalciferol, (DRISDOL) 50000 units CAPS capsule Take 50,000 Units by mouth every 7 (seven) days.    Historical Provider, MD    Family History Family History  Problem Relation Age of Onset  . Coronary artery disease Maternal Grandmother   . Diabetes type II Maternal Grandmother   . Cancer Maternal Grandmother   . Hypertension Mother   . Hypertension Father  Social History Social History  Substance Use Topics  . Smoking status: Never Smoker  . Smokeless tobacco: Never Used  . Alcohol use No     Allergies   Fish-derived products; Geodon [ziprasidone hcl]; Haldol [haloperidol lactate]; Compazine [prochlorperazine]; Morphine and related; Toradol [ketorolac tromethamine]; and Buprenorphine hcl   Review of Systems Review of Systems  Unable to perform ROS: Psychiatric disorder     Physical Exam Updated Vital Signs BP 130/68 (BP Location: Right Arm)   Pulse 111   Temp 98.5 F (36.9 C) (Oral)   Resp 21   LMP  (LMP Unknown)   SpO2 94%   Physical Exam  Constitutional: She is oriented to person, place, and time. She appears well-nourished. No distress.  Obesity obese young female awake, alert sitting upright in bed describing pain in her lower abdomen.  HENT:  Head: Normocephalic and atraumatic.  Eyes: Conjunctivae and EOM are normal.  Cardiovascular: Normal rate and regular rhythm.   Pulmonary/Chest: Effort normal and breath sounds normal. No stridor. No respiratory distress.  Abdominal: She exhibits no distension.    Musculoskeletal: She exhibits no edema.  Neurological: She is oriented to person, place, and time. No cranial nerve deficit.  Skin: Skin is warm and dry.  Psychiatric: Her speech is delayed. She is slowed and withdrawn. Cognition  and memory are impaired.  Nursing note and vitals reviewed.    ED Treatments / Results  Labs (all labs ordered are listed, but only abnormal results are displayed) Labs Reviewed  CBC - Abnormal; Notable for the following:       Result Value   Hemoglobin 11.1 (*)    HCT 34.4 (*)    All other components within normal limits  COMPREHENSIVE METABOLIC PANEL - Abnormal; Notable for the following:    Sodium 133 (*)    CO2 21 (*)    Glucose, Bld 439 (*)    AST 78 (*)    All other components within normal limits  CBG MONITORING, ED - Abnormal; Notable for the following:    Glucose-Capillary 403 (*)    All other components within normal limits  I-STAT VENOUS BLOOD GAS, ED - Abnormal; Notable for the following:    pCO2, Ven 33.2 (*)    pO2, Ven 77.0 (*)    Acid-base deficit 4.0 (*)    All other components within normal limits  CBG MONITORING, ED - Abnormal; Notable for the following:    Glucose-Capillary 404 (*)    All other components within normal limits  URINALYSIS, ROUTINE W REFLEX MICROSCOPIC  I-STAT CREATININE, ED  I-STAT BETA HCG BLOOD, ED ([facility], [facility], [facility] ONLY)  CBG MONITORING, ED    Radiology Ct Abdomen Pelvis W Contrast  Result Date: 03/07/2016 CLINICAL DATA:  Hyperglycemia, RIGHT lower quadrant tenderness for 2 days. History of diabetes, rhabdomyosarcoma, polycystic ovarian disease, hypertension. EXAM: CT ABDOMEN AND PELVIS WITH CONTRAST TECHNIQUE: Multidetector CT imaging of the abdomen and pelvis was performed using the standard protocol following bolus administration of intravenous contrast. CONTRAST:  186mL ISOVUE-300 IOPAMIDOL (ISOVUE-300) INJECTION 61% COMPARISON:  CT abdomen and pelvis January 05, 2016 FINDINGS: LOWER CHEST: Patchy ground-glass opacities LEFT lower lobe. No pleural effusion. HEPATOBILIARY: Worsening hepatomegaly (29 cm in cranial caudad, previously 26 cm) Focal fatty sparing caudate lobe versus transient hepatic attenuation difference. Diffusely hypodense  liver compatible with steatosis. Status post cholecystectomy. PANCREAS: Normal. SPLEEN: Increasing splenomegaly (22 cm in cranial caudad dimension, previously 20 cm). ADRENALS/URINARY TRACT: Kidneys are orthotopic, demonstrating symmetric enhancement. No nephrolithiasis, hydronephrosis or  solid renal masses. The unopacified ureters are normal in course and caliber. Urinary bladder is well distended and unremarkable. Normal adrenal glands. STOMACH/BOWEL: The stomach, small and large bowel are normal in course and caliber without inflammatory changes. Normal appendix. VASCULAR/LYMPHATIC: Aortoiliac vessels are normal in course and caliber. No lymphadenopathy by CT size criteria. REPRODUCTIVE: Normal. OTHER: No intraperitoneal free fluid or free air. MUSCULOSKELETAL: Nonacute. Anterior abdominal wall scarring. LEFT iliac bone island. IMPRESSION: No acute intra-abdominal or pelvic process.  Normal appendix. Worsening hepatosplenomegaly.  Similar hepatic steatosis. LEFT lower lobe atelectasis versus early pneumonia. Electronically Signed   By: Elon Alas M.D.   On: 03/07/2016 22:48    Procedures Procedures (including critical care time)  Medications Ordered in ED Medications  fentaNYL (SUBLIMAZE) injection 25 mcg (not administered)  sodium chloride 0.9 % bolus 1,000 mL (0 mLs Intravenous Stopped 03/07/16 2210)  iopamidol (ISOVUE-300) 61 % injection (100 mLs  Contrast Given 03/07/16 2213)  fentaNYL (SUBLIMAZE) injection 50 mcg (50 mcg Intravenous Given 03/07/16 2239)  insulin aspart (novoLOG) injection 10 Units (10 Units Intravenous Given 03/07/16 2311)  sodium chloride 0.9 % bolus 1,000 mL (1,000 mLs Intravenous New Bag/Given 03/07/16 2311)     Initial Impression / Assessment and Plan / ED Course  I have reviewed the triage vital signs and the nursing notes.  Pertinent labs & imaging results that were available during my care of the patient were reviewed by me and considered in my medical decision  making (see chart for details).  11:39 PM After CT scan patient appears more comfortable.  HR now ~95, BP nml, and she states that her pain has improved.  I discussed the patient's meds with our pharmacist, who notes that the patient may have recently had substantial changes in her meds.  Subsequently I discussed this with the patient, and if he is appropriate for discharge, she will have notation instructing her care providers to discuss her medications with her team of physicians to ensure appropriate ongoing titration.  Update: Patient remains better appearing, no evidence for distress, vital signs remain similar, improved since arrival. With reassuring CT scan, no evidence for DKA, declining glucose, patient is appropriate for discharge, with close outpatient follow-up.   Final Clinical Impressions(s) / ED Diagnoses  Abdominal pain Hyperglycemia    Carmin Muskrat, MD 03/07/16 2342

## 2016-03-08 DIAGNOSIS — G4733 Obstructive sleep apnea (adult) (pediatric): Secondary | ICD-10-CM | POA: Diagnosis not present

## 2016-03-08 DIAGNOSIS — E1165 Type 2 diabetes mellitus with hyperglycemia: Secondary | ICD-10-CM | POA: Diagnosis not present

## 2016-03-08 DIAGNOSIS — F259 Schizoaffective disorder, unspecified: Secondary | ICD-10-CM | POA: Diagnosis not present

## 2016-03-08 DIAGNOSIS — I10 Essential (primary) hypertension: Secondary | ICD-10-CM | POA: Diagnosis not present

## 2016-03-08 DIAGNOSIS — R100 Acute abdomen: Secondary | ICD-10-CM | POA: Diagnosis not present

## 2016-03-08 LAB — CBG MONITORING, ED: Glucose-Capillary: 257 mg/dL — ABNORMAL HIGH (ref 65–99)

## 2016-03-08 NOTE — ED Notes (Signed)
Tina Saunders Staff states there is a Marketing executive that may be available to transport patient back to [facility]; Name and number was given in case this plan fails, [facility] will then be notified

## 2016-03-08 NOTE — Discharge Instructions (Signed)
Today's evaluation has been generally reassuring.  However, it is very important that you follow-up with your primary care physician, specifically to discuss your medications, and your persistent hyperglycemia.  Return here for concerning changes in your condition.

## 2016-03-09 DIAGNOSIS — F259 Schizoaffective disorder, unspecified: Secondary | ICD-10-CM | POA: Diagnosis not present

## 2016-03-10 DIAGNOSIS — F259 Schizoaffective disorder, unspecified: Secondary | ICD-10-CM | POA: Diagnosis not present

## 2016-03-11 DIAGNOSIS — F259 Schizoaffective disorder, unspecified: Secondary | ICD-10-CM | POA: Diagnosis not present

## 2016-03-12 DIAGNOSIS — F259 Schizoaffective disorder, unspecified: Secondary | ICD-10-CM | POA: Diagnosis not present

## 2016-03-13 DIAGNOSIS — F259 Schizoaffective disorder, unspecified: Secondary | ICD-10-CM | POA: Diagnosis not present

## 2016-03-14 DIAGNOSIS — R040 Epistaxis: Secondary | ICD-10-CM | POA: Diagnosis not present

## 2016-03-14 DIAGNOSIS — E1165 Type 2 diabetes mellitus with hyperglycemia: Secondary | ICD-10-CM | POA: Diagnosis not present

## 2016-03-17 DIAGNOSIS — F259 Schizoaffective disorder, unspecified: Secondary | ICD-10-CM | POA: Diagnosis not present

## 2016-03-18 DIAGNOSIS — F259 Schizoaffective disorder, unspecified: Secondary | ICD-10-CM | POA: Diagnosis not present

## 2016-03-19 DIAGNOSIS — F259 Schizoaffective disorder, unspecified: Secondary | ICD-10-CM | POA: Diagnosis not present

## 2016-03-20 DIAGNOSIS — E119 Type 2 diabetes mellitus without complications: Secondary | ICD-10-CM | POA: Diagnosis not present

## 2016-03-20 DIAGNOSIS — R45851 Suicidal ideations: Secondary | ICD-10-CM | POA: Diagnosis not present

## 2016-03-20 DIAGNOSIS — F919 Conduct disorder, unspecified: Secondary | ICD-10-CM | POA: Diagnosis not present

## 2016-03-20 DIAGNOSIS — E7800 Pure hypercholesterolemia, unspecified: Secondary | ICD-10-CM | POA: Diagnosis not present

## 2016-03-20 DIAGNOSIS — Z794 Long term (current) use of insulin: Secondary | ICD-10-CM | POA: Diagnosis not present

## 2016-03-20 DIAGNOSIS — R4182 Altered mental status, unspecified: Secondary | ICD-10-CM | POA: Diagnosis not present

## 2016-03-20 DIAGNOSIS — F72 Severe intellectual disabilities: Secondary | ICD-10-CM | POA: Diagnosis not present

## 2016-03-20 DIAGNOSIS — I10 Essential (primary) hypertension: Secondary | ICD-10-CM | POA: Diagnosis not present

## 2016-03-20 DIAGNOSIS — Z79899 Other long term (current) drug therapy: Secondary | ICD-10-CM | POA: Diagnosis not present

## 2016-03-20 DIAGNOSIS — F250 Schizoaffective disorder, bipolar type: Secondary | ICD-10-CM | POA: Diagnosis not present

## 2016-03-22 DIAGNOSIS — F259 Schizoaffective disorder, unspecified: Secondary | ICD-10-CM | POA: Diagnosis not present

## 2016-03-23 DIAGNOSIS — Z794 Long term (current) use of insulin: Secondary | ICD-10-CM | POA: Diagnosis not present

## 2016-03-23 DIAGNOSIS — E7800 Pure hypercholesterolemia, unspecified: Secondary | ICD-10-CM | POA: Diagnosis not present

## 2016-03-23 DIAGNOSIS — F329 Major depressive disorder, single episode, unspecified: Secondary | ICD-10-CM | POA: Diagnosis not present

## 2016-03-23 DIAGNOSIS — R45851 Suicidal ideations: Secondary | ICD-10-CM | POA: Diagnosis not present

## 2016-03-23 DIAGNOSIS — F99 Mental disorder, not otherwise specified: Secondary | ICD-10-CM | POA: Diagnosis not present

## 2016-03-23 DIAGNOSIS — I10 Essential (primary) hypertension: Secondary | ICD-10-CM | POA: Diagnosis not present

## 2016-03-23 DIAGNOSIS — E119 Type 2 diabetes mellitus without complications: Secondary | ICD-10-CM | POA: Diagnosis not present

## 2016-03-23 DIAGNOSIS — Z79899 Other long term (current) drug therapy: Secondary | ICD-10-CM | POA: Diagnosis not present

## 2016-03-26 DIAGNOSIS — F259 Schizoaffective disorder, unspecified: Secondary | ICD-10-CM | POA: Diagnosis not present

## 2016-03-27 DIAGNOSIS — I10 Essential (primary) hypertension: Secondary | ICD-10-CM | POA: Diagnosis not present

## 2016-03-27 DIAGNOSIS — F319 Bipolar disorder, unspecified: Secondary | ICD-10-CM | POA: Diagnosis not present

## 2016-03-27 DIAGNOSIS — Z794 Long term (current) use of insulin: Secondary | ICD-10-CM | POA: Diagnosis not present

## 2016-03-27 DIAGNOSIS — F259 Schizoaffective disorder, unspecified: Secondary | ICD-10-CM | POA: Diagnosis not present

## 2016-03-27 DIAGNOSIS — R32 Unspecified urinary incontinence: Secondary | ICD-10-CM | POA: Diagnosis not present

## 2016-03-27 DIAGNOSIS — E1165 Type 2 diabetes mellitus with hyperglycemia: Secondary | ICD-10-CM | POA: Diagnosis not present

## 2016-03-27 DIAGNOSIS — M791 Myalgia: Secondary | ICD-10-CM | POA: Diagnosis not present

## 2016-03-28 DIAGNOSIS — F259 Schizoaffective disorder, unspecified: Secondary | ICD-10-CM | POA: Diagnosis not present

## 2016-03-29 DIAGNOSIS — F259 Schizoaffective disorder, unspecified: Secondary | ICD-10-CM | POA: Diagnosis not present

## 2016-03-29 DIAGNOSIS — E1165 Type 2 diabetes mellitus with hyperglycemia: Secondary | ICD-10-CM | POA: Diagnosis not present

## 2016-03-29 DIAGNOSIS — F339 Major depressive disorder, recurrent, unspecified: Secondary | ICD-10-CM | POA: Diagnosis not present

## 2016-03-30 DIAGNOSIS — R05 Cough: Secondary | ICD-10-CM | POA: Diagnosis not present

## 2016-04-01 DIAGNOSIS — F259 Schizoaffective disorder, unspecified: Secondary | ICD-10-CM | POA: Diagnosis not present

## 2016-04-02 DIAGNOSIS — F259 Schizoaffective disorder, unspecified: Secondary | ICD-10-CM | POA: Diagnosis not present

## 2016-04-04 ENCOUNTER — Emergency Department (HOSPITAL_COMMUNITY): Payer: Medicare HMO

## 2016-04-04 ENCOUNTER — Encounter (HOSPITAL_COMMUNITY): Payer: Self-pay | Admitting: Emergency Medicine

## 2016-04-04 ENCOUNTER — Emergency Department (HOSPITAL_COMMUNITY)
Admission: EM | Admit: 2016-04-04 | Discharge: 2016-04-05 | Disposition: A | Discharge status: Home / Self Care | Payer: Medicare HMO | Attending: Emergency Medicine | Admitting: Emergency Medicine

## 2016-04-04 DIAGNOSIS — R071 Chest pain on breathing: Secondary | ICD-10-CM | POA: Diagnosis not present

## 2016-04-04 DIAGNOSIS — E119 Type 2 diabetes mellitus without complications: Secondary | ICD-10-CM | POA: Diagnosis not present

## 2016-04-04 DIAGNOSIS — Z79899 Other long term (current) drug therapy: Secondary | ICD-10-CM | POA: Diagnosis not present

## 2016-04-04 DIAGNOSIS — R45851 Suicidal ideations: Secondary | ICD-10-CM | POA: Diagnosis not present

## 2016-04-04 DIAGNOSIS — F250 Schizoaffective disorder, bipolar type: Secondary | ICD-10-CM | POA: Diagnosis present

## 2016-04-04 DIAGNOSIS — Z794 Long term (current) use of insulin: Secondary | ICD-10-CM | POA: Insufficient documentation

## 2016-04-04 DIAGNOSIS — F4310 Post-traumatic stress disorder, unspecified: Secondary | ICD-10-CM | POA: Diagnosis not present

## 2016-04-04 DIAGNOSIS — F99 Mental disorder, not otherwise specified: Secondary | ICD-10-CM | POA: Diagnosis not present

## 2016-04-04 DIAGNOSIS — Z85028 Personal history of other malignant neoplasm of stomach: Secondary | ICD-10-CM | POA: Diagnosis not present

## 2016-04-04 DIAGNOSIS — I10 Essential (primary) hypertension: Secondary | ICD-10-CM | POA: Insufficient documentation

## 2016-04-04 DIAGNOSIS — R079 Chest pain, unspecified: Secondary | ICD-10-CM | POA: Diagnosis not present

## 2016-04-04 DIAGNOSIS — E118 Type 2 diabetes mellitus with unspecified complications: Secondary | ICD-10-CM | POA: Diagnosis not present

## 2016-04-04 DIAGNOSIS — Z888 Allergy status to other drugs, medicaments and biological substances status: Secondary | ICD-10-CM | POA: Diagnosis not present

## 2016-04-04 LAB — ETHANOL: Alcohol, Ethyl (B): <5 mg/dL (ref <5)

## 2016-04-04 LAB — CBC
HEMATOCRIT: 32.8 % — AB (ref 36.0–46.0)
Hemoglobin: 10.5 g/dL — ABNORMAL LOW (ref 12.0–15.0)
MCH: 26.4 pg (ref 26.0–34.0)
MCHC: 32 g/dL (ref 30.0–36.0)
MCV: 82.4 fL (ref 78.0–100.0)
PLATELETS: 158 10*3/uL (ref 150–400)
RBC: 3.98 MIL/uL (ref 3.87–5.11)
RDW: 15.1 % (ref 11.5–15.5)
WBC: 5 10*3/uL (ref 4.0–10.5)

## 2016-04-04 LAB — COMPREHENSIVE METABOLIC PANEL
ALBUMIN: 3.8 g/dL (ref 3.5–5.0)
ALT: 33 U/L (ref 14–54)
AST: 63 U/L — AB (ref 15–41)
Alkaline Phosphatase: 101 U/L (ref 38–126)
Anion gap: 10 (ref 5–15)
BILIRUBIN TOTAL: 0.6 mg/dL (ref 0.3–1.2)
BUN: 11 mg/dL (ref 6–20)
CO2: 22 mmol/L (ref 22–32)
CREATININE: 0.55 mg/dL (ref 0.44–1.00)
Calcium: 9.2 mg/dL (ref 8.9–10.3)
Chloride: 102 mmol/L (ref 101–111)
GFR calc Af Amer: >60 mL/min (ref >60)
GFR calc non Af Amer: >60 mL/min (ref >60)
GLUCOSE: 282 mg/dL — AB (ref 65–99)
POTASSIUM: 4 mmol/L (ref 3.5–5.1)
Sodium: 134 mmol/L — ABNORMAL LOW (ref 135–145)
Total Protein: 8 g/dL (ref 6.5–8.1)

## 2016-04-04 LAB — ACETAMINOPHEN LEVEL: Acetaminophen (Tylenol), Serum: <10 ug/mL — ABNORMAL LOW (ref 10–30)

## 2016-04-04 LAB — SALICYLATE LEVEL: Salicylate Lvl: <7 mg/dL (ref 2.8–30.0)

## 2016-04-04 LAB — D-DIMER, QUANTITATIVE: D-Dimer, Quant: <0.27 ug/mL-FEU (ref 0.00–0.50)

## 2016-04-04 MED ORDER — TOPIRAMATE 100 MG PO TABS
100.0000 mg | ORAL_TABLET | Freq: Two times a day (BID) | ORAL | Status: DC
  Administered 2016-04-04 – 2016-04-05 (×3): 100 mg via ORAL
  Filled 2016-04-04 (×3): qty 1

## 2016-04-04 MED ORDER — SERTRALINE HCL 50 MG PO TABS
200.0000 mg | ORAL_TABLET | ORAL | Status: DC
  Administered 2016-04-05: 200 mg via ORAL
  Filled 2016-04-04 (×2): qty 4

## 2016-04-04 MED ORDER — GABAPENTIN 400 MG PO CAPS: 800.0000 mg | ORAL_CAPSULE | Freq: Three times a day (TID) | ORAL | Status: DC

## 2016-04-04 MED ORDER — TRAZODONE HCL 50 MG PO TABS
25.0000 mg | ORAL_TABLET | ORAL | Status: DC
  Administered 2016-04-04: 25 mg via ORAL
  Filled 2016-04-04: qty 1

## 2016-04-04 MED ORDER — QUETIAPINE FUMARATE 300 MG PO TABS
300.0000 mg | ORAL_TABLET | Freq: Two times a day (BID) | ORAL | Status: DC
  Administered 2016-04-04 – 2016-04-05 (×3): 300 mg via ORAL
  Filled 2016-04-04 (×3): qty 1

## 2016-04-04 MED ORDER — TOPIRAMATE 25 MG PO TABS: 25.0000 mg | ORAL_TABLET | Freq: Two times a day (BID) | ORAL | Status: DC

## 2016-04-04 MED ORDER — BENZONATATE 100 MG PO CAPS
100.0000 mg | ORAL_CAPSULE | Freq: Three times a day (TID) | ORAL | Status: DC | PRN
Start: 2016-04-04 — End: 2016-04-05
  Administered 2016-04-04: 100 mg via ORAL
  Filled 2016-04-04: qty 1

## 2016-04-04 MED ORDER — DM-GUAIFENESIN ER 30-600 MG PO TB12
1.0000 | ORAL_TABLET | Freq: Two times a day (BID) | ORAL | Status: DC | PRN
  Filled 2016-04-04: qty 1

## 2016-04-04 MED ORDER — QUETIAPINE FUMARATE 100 MG PO TABS: 800.0000 mg | ORAL_TABLET | Freq: Every day | ORAL | Status: DC

## 2016-04-04 MED ORDER — GABAPENTIN 400 MG PO CAPS
400.0000 mg | ORAL_CAPSULE | Freq: Three times a day (TID) | ORAL | Status: DC
  Administered 2016-04-04 – 2016-04-05 (×4): 400 mg via ORAL
  Filled 2016-04-04 (×4): qty 1

## 2016-04-04 MED ORDER — BUSPIRONE HCL 10 MG PO TABS
15.0000 mg | ORAL_TABLET | Freq: Three times a day (TID) | ORAL | Status: DC
  Administered 2016-04-04 – 2016-04-05 (×4): 15 mg via ORAL
  Filled 2016-04-04 (×4): qty 2

## 2016-04-04 NOTE — ED Triage Notes (Signed)
Per [facility], pt is being brought in for suicidal ideations. Staff reports this is the 3rd time she has been transported for this same reason. Pt stated that she was going to "stab herself and cut her wrists". Pt also reported that she had been "on [employer] looking up how to drown herself." Pt denies any additional drug use aside from prescribed medications.

## 2016-04-04 NOTE — Progress Notes (Signed)
Per Patriciaann Clan, PA meets inpatient criteria Areta Terwilliger K. Nash Shearer, LPC-A, [facility]  Counselor 04/04/2016 5:43 AM

## 2016-04-04 NOTE — ED Notes (Signed)
Director from facility called, Tina Saunders.  Patient came to facility on 2-26 with plan to drown herself.  She was sent out to [facility] for evaluation and discharged back to facility [facility]) on 2-28 with no medication changes.  Thursday 3-1, staff at [facility] found patient on the computer looking up ways to kill herself but make it look like an accident.  On 3-1 patient was sent back to [facility] to transtition unit, evaluated by medical and psych and discharged back to facility on 3-3, no medication changes.  Patient was put on 15 minute safety checks.  MD at facility changed  Chlorprormazine 25 mg tid to 50 mg tid  Tina Saunders [phone]

## 2016-04-04 NOTE — BH Assessment (Addendum)
Tina Saunders Assessment Progress Note  Pt's nurse, Magda Paganini, reports that pt is attempting to leave [facility].  This Probation officer called pt's assisted living facility to ascertain whether pt is her own guardian.  I spoke to [place], who reports that pt is, indeed, her own guardian.  Jalene Mullet, MA Triage Specialist [phone]  Addendum:  Pt's attempt to leave ED has been staffed with Waylan Boga, DNP, and EDP Dorie Rank, MD, and it has been determined that pt meets criteria for IVC, which Dr Tomi Bamberger has initiated.  IVC documents have been faxed to [facility], and at 15:47 Lattie Corns confirms receipt.  As of this writing, service of Findings and Custody Order is pending.  Pt's nurse has been notified.  Jalene Mullet, [place] Triage Specialist [phone]

## 2016-04-04 NOTE — ED Notes (Signed)
Per EMS pt is from [facility]

## 2016-04-04 NOTE — ED Provider Notes (Signed)
[facility] DEPT Provider Note   CSN: [id number] Arrival date & time: 04/04/16  0055  By signing my name below, I, Oleh Genin, attest that this documentation has been prepared under the direction and in the presence of Merryl Hacker, MD. Electronically Signed: Oleh Genin, Scribe. 04/04/16. 4:22 AM.   History   Chief Complaint Chief Complaint  Patient presents with  . Suicidal    HPI KATHLYNN SWOFFORD is a 31 y.o. female with history of bipolar disorder, schizophrenia, HTN, diabetes, and obesity who presents to the ED with suicidality. This patient states that she is currently residing at assisted living. She states that "she wants to drown herself in bathwater". She has previously attempted suicide by cutting which required hospitalization. She denies any drug use tonight. She denies any self harm. No homicidality. When asked, she is reporting 3 days of L sided chest pain "whenever she breathes in deeply" and occasional dry cough. No fevers. No tobacco use.    The history is provided by the patient. No language interpreter was used.    Past Medical History:  Diagnosis Date  . Anxiety   . Bipolar 1 disorder ([place])   . Cancer of abdominal wall   . Depression   . Diabetes mellitus without complication ([place])   . Hypertension   . Obesity   . Obesity   . Polycystic ovarian syndrome 07/01/2011   Patient report  . 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 Schizophrenia [facility])     Patient Active Problem List   Diagnosis Date Noted  . Schizoaffective disorder, bipolar type ([place]) 05/30/2014  . Suicidal ideation   . 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 Headache 04/15/2013  . HTN (hypertension) 04/15/2013  . Post traumatic stress disorder 12/07/2011  . CAP (community acquired pneumonia) 08/27/2011  . Chest pain 08/26/2011  . SOB (shortness of breath) 08/26/2011  . Fever 08/26/2011  . Hypokalemia 08/26/2011  . PSVT (paroxysmal supraventricular tachycardia) ([place]) 08/26/2011  . ADHD  09/23/2007  . EPIGASTRIC PAIN 09/23/2007  . Obesity, unspecified 07/30/2007  . DEPRESSION 07/30/2007  . SLEEP DISORDER 07/30/2007  . IMPAIRED FASTING GLUCOSE 07/30/2007  . FATIGUE 11/21/2006  . ABNORMAL FINDINGS, ELEVATED BP W/O HTN 11/21/2006  . METRORRHAGIA 06/13/2006  . DISORDER, MENSTRUAL NEC 06/13/2006  . DIZZINESS 06/13/2006  . POLYCYSTIC OVARIAN DISEASE 04/25/2006  . AMENORRHEA, SECONDARY 04/20/2006  . ACNE, MILD 04/20/2006  . ABDOMINAL PAIN 04/20/2006    Past Surgical History:  Procedure Laterality Date  . CHOLECYSTECTOMY    . HERNIA REPAIR    . Ovarian Cyst Excision    . VARICOSE VEIN SURGERY      OB History    Gravida Para Term Preterm AB Living   0             SAB TAB Ectopic Multiple Live Births                   Home Medications    Prior to Admission medications   Medication Sig Start Date End Date Taking? Authorizing Provider  albuterol (PROVENTIL HFA;VENTOLIN HFA) 108 (90 Base) MCG/ACT inhaler Inhale 1 puff into the lungs every 4 (four) hours as needed for wheezing or shortness of breath.   Yes Historical Provider, MD  amoxicillin-clavulanate (AUGMENTIN) 875-125 MG tablet Take 1 tablet by mouth 2 (two) times daily. 03/31/16 04/10/16 Yes Historical Provider, MD  busPIRone (BUSPAR) 15 MG tablet Take 15 mg by mouth 3 (three) times daily.   Yes Historical Provider, MD  chlorproMAZINE (  THORAZINE) 25 MG tablet Take 50 mg by mouth 3 (three) times daily.    Yes Historical Provider, MD  cloNIDine (CATAPRES) 0.1 MG tablet Take 0.1 mg by mouth 2 (two) times daily.   Yes Historical Provider, MD  cyclobenzaprine (FLEXERIL) 5 MG tablet Take 5 mg by mouth 2 (two) times daily.   Yes Historical Provider, MD  dicyclomine (BENTYL) 20 MG tablet Take 20 mg by mouth every 8 (eight) hours as needed for spasms.   Yes Historical Provider, MD  docusate sodium (COLACE) 100 MG capsule Take 100 mg by mouth 2 (two) times daily.   Yes Historical Provider, MD  fenofibrate (TRICOR) 145 MG  tablet Take 145 mg by mouth daily.   Yes Historical Provider, MD  gabapentin (NEURONTIN) 800 MG tablet Take 800 mg by mouth 3 (three) times daily.   Yes Historical Provider, MD  guaiFENesin (MUCINEX) 600 MG 12 hr tablet Take 600 mg by mouth 2 (two) times daily. 03/30/16 04/06/16 Yes Historical Provider, MD  ibuprofen (ADVIL,MOTRIN) 800 MG tablet Take 1 tablet (800 mg total) by mouth every 8 (eight) hours as needed for moderate pain. 01/08/15  Yes Domenic Moras, PA-C  insulin detemir (LEVEMIR) 100 UNIT/ML injection Inject 57 Units into the skin 2 (two) times daily.    Yes Historical Provider, MD  lactulose (CHRONULAC) 10 GM/15ML solution Take 20 g by mouth 2 (two) times daily.   Yes Historical Provider, MD  lisinopril (PRINIVIL,ZESTRIL) 10 MG tablet Take 10 mg by mouth daily.   Yes Historical Provider, MD  LORazepam (ATIVAN) 0.5 MG tablet Take 0.5 mg by mouth daily at 12 noon.   Yes Historical Provider, MD  meloxicam (MOBIC) 7.5 MG tablet Take 7.5 mg by mouth 2 (two) times daily.   Yes Historical Provider, MD  norgestimate-ethinyl estradiol (ORTHO-CYCLEN,SPRINTEC,PREVIFEM) 0.25-35 MG-MCG tablet Take 1 tablet by mouth daily. 11/24/15  Yes Tanna Savoy Stinson, DO  pantoprazole (PROTONIX) 40 MG tablet Take 40 mg by mouth at bedtime.   Yes Historical Provider, MD  polyethylene glycol (MIRALAX / GLYCOLAX) packet Take 17 g by mouth daily.   Yes Historical Provider, MD  QUEtiapine (SEROQUEL) 400 MG tablet Take 800 mg by mouth at bedtime.   Yes Historical Provider, MD  sertraline (ZOLOFT) 100 MG tablet Take 200 mg by mouth every morning.    Yes Historical Provider, MD  simvastatin (ZOCOR) 10 MG tablet Take 10 mg by mouth at bedtime.   Yes Historical Provider, MD  topiramate (TOPAMAX) 25 MG tablet Take 25 mg by mouth 2 (two) times daily.   Yes Historical Provider, MD  traMADol (ULTRAM) 50 MG tablet Take 25 mg by mouth every 12 (twelve) hours as needed for moderate pain.   Yes Historical Provider, MD  traZODone (DESYREL)  50 MG tablet Take 25 mg by mouth See admin instructions. Takes 1/2 tab at 0800, 1200, then takes 2 tabs at bedtime 2000   Yes Historical Provider, MD  Vitamin D, Ergocalciferol, (DRISDOL) 50000 units CAPS capsule Take 50,000 Units by mouth every Thursday.    Yes Historical Provider, MD    Family History Family History  Problem Relation Age of Onset  . Coronary artery disease Maternal Grandmother   . Diabetes type II Maternal Grandmother   . Cancer Maternal Grandmother   . Hypertension Mother   . Hypertension Father     Social History Social History  Substance Use Topics  . Smoking status: Never Smoker  . Smokeless tobacco: Never Used  . Alcohol use No  Allergies   Fish-derived products; Geodon [ziprasidone hcl]; Haldol [haloperidol lactate]; Buprenorphine hcl; Compazine [prochlorperazine]; Morphine and related; and Toradol [ketorolac tromethamine]   Review of Systems Review of Systems  Constitutional: Negative for fever.  Respiratory: Positive for cough. Negative for shortness of breath.   Cardiovascular: Positive for chest pain. Negative for leg swelling.  Psychiatric/Behavioral:       + suicidal ideation. No self harm. No drug use. No homicidal ideations.   All other systems reviewed and are negative.    Physical Exam Updated Vital Signs BP 124/73 (BP Location: Left Arm)   Pulse 116   Temp 98 F (36.7 C) (Oral)   Resp 16   LMP 03/08/2016   SpO2 93%   Physical Exam  Constitutional: She is oriented to person, place, and time. No distress.  Obese  HENT:  Head: Normocephalic and atraumatic.  Cardiovascular: Normal rate, regular rhythm and normal heart sounds.   No murmur heard. Pulmonary/Chest: Effort normal. No respiratory distress. She has no wheezes. She exhibits tenderness.  Abdominal: Soft. Bowel sounds are normal.  Neurological: She is alert and oriented to person, place, and time.  Skin: Skin is warm and dry.  Psychiatric: She has a normal mood and  affect.  Nursing note and vitals reviewed.    ED Treatments / Results  Labs (all labs ordered are listed, but only abnormal results are displayed) Labs Reviewed  COMPREHENSIVE METABOLIC PANEL - Abnormal; Notable for the following:       Result Value   Sodium 134 (*)    Glucose, Bld 282 (*)    AST 63 (*)    All other components within normal limits  ACETAMINOPHEN LEVEL - Abnormal; Notable for the following:    Acetaminophen (Tylenol), Serum <10 (*)    All other components within normal limits  CBC - Abnormal; Notable for the following:    Hemoglobin 10.5 (*)    HCT 32.8 (*)    All other components within normal limits  ETHANOL  SALICYLATE LEVEL  D-DIMER, QUANTITATIVE (NOT AT [facility])  RAPID URINE DRUG SCREEN, HOSP PERFORMED    EKG  EKG Interpretation  Date/Time:  Tuesday April 04 2016 02:17:31 EDT Ventricular Rate:  108 PR Interval:    QRS Duration: 93 QT Interval:  345 QTC Calculation: 463 R Axis:   -20 Text Interpretation:  Sinus tachycardia Borderline left axis deviation Low voltage, precordial leads Abnormal Q suggests anterior infarct Confirmed by Dina Rich  MD, COURTNEY ([id number]) on 04/04/2016 2:23:52 AM       Radiology Dg Chest 2 View  Result Date: 04/04/2016 CLINICAL DATA:  Medical clearance. Chest pain for 3 days. History of hypertension, obesity, rhabdosarcoma. EXAM: CHEST  2 VIEW COMPARISON:  Chest radiograph February 04, 2016 FINDINGS: Cardiomediastinal silhouette is unremarkable for this low inspiratory examination with crowded vasculature markings. The lungs are clear without pleural effusions or focal consolidations. Trachea projects midline and there is no pneumothorax. Included soft tissue planes and osseous structures are non-suspicious. Large body habitus. IMPRESSION: No acute cardiopulmonary process. Electronically Signed   By: Elon Alas M.D.   On: 04/04/2016 02:46    Procedures Procedures (including critical care time)  Medications Ordered in  ED Medications - No data to display   Initial Impression / Assessment and Plan / ED Course  I have reviewed the triage vital signs and the nursing notes.  Pertinent labs & imaging results that were available during my care of the patient were reviewed by me and considered in my medical  decision making (see chart for details).     Patient presents with suicidal ideations. History of same. She also reports chest pain. She is nontoxic. She is tachycardic.  EKG reassuring. Chest x-ray negative. Psych screening lab work negative. D-dimer pending. If d-dimer is negative, patient medically clear for psychiatric evaluation.  4:22 AM Patient medically clear for TTS evaluation  Final Clinical Impressions(s) / ED Diagnoses   Final diagnoses:  Suicidal ideation  Chest pain on breathing    New Prescriptions New Prescriptions   No medications on file   I personally performed the services described in this documentation, which was scribed in my presence. The recorded information has been reviewed and is accurate.    Merryl Hacker, MD 04/04/16 [phone]

## 2016-04-04 NOTE — ED Notes (Signed)
Bedside reporting done with Margaretha Sheffield
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
## 2016-04-04 NOTE — ED Notes (Signed)
Bed: HK32 Expected date:  Expected time:  Means of arrival:  Comments: 31 yo F/ SI

## 2016-04-04 NOTE — ED Notes (Signed)
MD at bedside. 

## 2016-04-05 DIAGNOSIS — F250 Schizoaffective disorder, bipolar type: Secondary | ICD-10-CM

## 2016-04-05 DIAGNOSIS — Z79899 Other long term (current) drug therapy: Secondary | ICD-10-CM

## 2016-04-05 DIAGNOSIS — Z794 Long term (current) use of insulin: Secondary | ICD-10-CM | POA: Diagnosis not present

## 2016-04-05 DIAGNOSIS — F4310 Post-traumatic stress disorder, unspecified: Secondary | ICD-10-CM | POA: Diagnosis not present

## 2016-04-05 DIAGNOSIS — F259 Schizoaffective disorder, unspecified: Secondary | ICD-10-CM | POA: Diagnosis not present

## 2016-04-05 DIAGNOSIS — R071 Chest pain on breathing: Secondary | ICD-10-CM | POA: Diagnosis not present

## 2016-04-05 DIAGNOSIS — E118 Type 2 diabetes mellitus with unspecified complications: Secondary | ICD-10-CM

## 2016-04-05 DIAGNOSIS — Z888 Allergy status to other drugs, medicaments and biological substances status: Secondary | ICD-10-CM

## 2016-04-05 LAB — RAPID URINE DRUG SCREEN, HOSP PERFORMED
Amphetamines: NOT DETECTED
BARBITURATES: NOT DETECTED
Benzodiazepines: NOT DETECTED
Cocaine: NOT DETECTED
Opiates: NOT DETECTED
Tetrahydrocannabinol: NOT DETECTED

## 2016-04-05 LAB — CBG MONITORING, ED
GLUCOSE-CAPILLARY: 297 mg/dL — AB (ref 65–99)
Glucose-Capillary: 310 mg/dL — ABNORMAL HIGH (ref 65–99)

## 2016-04-05 MED ORDER — INSULIN ASPART 100 UNIT/ML ~~LOC~~ SOLN: 22.0000 [IU] | Freq: Three times a day (TID) | SUBCUTANEOUS | Status: DC

## 2016-04-05 MED ORDER — INSULIN ASPART 100 UNIT/ML ~~LOC~~ SOLN
0.0000 [IU] | SUBCUTANEOUS | Status: DC
  Administered 2016-04-05: 11 [IU] via SUBCUTANEOUS
  Administered 2016-04-05: 15 [IU] via SUBCUTANEOUS
  Filled 2016-04-05 (×3): qty 1

## 2016-04-05 MED ORDER — CLONIDINE HCL 0.1 MG PO TABS
0.1000 mg | ORAL_TABLET | Freq: Two times a day (BID) | ORAL | Status: DC
  Administered 2016-04-05: 0.1 mg via ORAL
  Filled 2016-04-05: qty 1

## 2016-04-05 MED ORDER — DICYCLOMINE HCL 20 MG PO TABS: 20.0000 mg | ORAL_TABLET | Freq: Three times a day (TID) | ORAL | Status: DC | PRN

## 2016-04-05 MED ORDER — FENOFIBRATE 54 MG PO TABS
54.0000 mg | ORAL_TABLET | Freq: Every day | ORAL | Status: DC
  Administered 2016-04-05: 54 mg via ORAL
  Filled 2016-04-05: qty 1

## 2016-04-05 MED ORDER — GABAPENTIN 400 MG PO CAPS: 400.0000 mg | ORAL_CAPSULE | Freq: Three times a day (TID) | ORAL | 0 refills | Status: DC

## 2016-04-05 MED ORDER — CYCLOBENZAPRINE HCL 10 MG PO TABS
5.0000 mg | ORAL_TABLET | Freq: Two times a day (BID) | ORAL | Status: DC
  Administered 2016-04-05: 5 mg via ORAL
  Filled 2016-04-05: qty 1

## 2016-04-05 MED ORDER — TOPIRAMATE 100 MG PO TABS: 100.0000 mg | ORAL_TABLET | Freq: Two times a day (BID) | ORAL | 0 refills | Status: DC

## 2016-04-05 MED ORDER — ALBUTEROL SULFATE HFA 108 (90 BASE) MCG/ACT IN AERS
1.0000 | INHALATION_SPRAY | RESPIRATORY_TRACT | Status: DC | PRN
  Administered 2016-04-05: 1 via RESPIRATORY_TRACT
  Filled 2016-04-05: qty 6.7

## 2016-04-05 MED ORDER — INSULIN DETEMIR 100 UNIT/ML ~~LOC~~ SOLN: 57.0000 [IU] | Freq: Two times a day (BID) | SUBCUTANEOUS | Status: DC

## 2016-04-05 MED ORDER — VITAMIN D (ERGOCALCIFEROL) 1.25 MG (50000 UNIT) PO CAPS: 50000.0000 [IU] | ORAL_CAPSULE | ORAL | Status: DC

## 2016-04-05 MED ORDER — NORGESTIMATE-ETH ESTRADIOL 0.25-35 MG-MCG PO TABS: 1.0000 | ORAL_TABLET | Freq: Every day | ORAL | Status: DC

## 2016-04-05 MED ORDER — DOCUSATE SODIUM 100 MG PO CAPS
100.0000 mg | ORAL_CAPSULE | Freq: Two times a day (BID) | ORAL | Status: DC
  Administered 2016-04-05: 100 mg via ORAL
  Filled 2016-04-05: qty 1

## 2016-04-05 MED ORDER — QUETIAPINE FUMARATE 300 MG PO TABS: 300.0000 mg | ORAL_TABLET | Freq: Two times a day (BID) | ORAL | 0 refills | Status: DC

## 2016-04-05 MED ORDER — LACTULOSE 10 GM/15ML PO SOLN
20.0000 g | Freq: Two times a day (BID) | ORAL | Status: DC
  Administered 2016-04-05: 20 g via ORAL
  Filled 2016-04-05: qty 30

## 2016-04-05 MED ORDER — LISINOPRIL 10 MG PO TABS
10.0000 mg | ORAL_TABLET | Freq: Every day | ORAL | Status: DC
  Administered 2016-04-05: 10 mg via ORAL
  Filled 2016-04-05: qty 1

## 2016-04-05 MED ORDER — PANTOPRAZOLE SODIUM 40 MG PO TBEC: 40.0000 mg | DELAYED_RELEASE_TABLET | Freq: Every day | ORAL | Status: DC

## 2016-04-05 MED ORDER — SIMVASTATIN 10 MG PO TABS: 10.0000 mg | ORAL_TABLET | Freq: Every day | ORAL | Status: DC

## 2016-04-05 NOTE — ED Notes (Signed)
Patient understood discharge instructions. Discussed AVS, with patient, no question asked.

## 2016-04-05 NOTE — Progress Notes (Signed)
CSW spoke with Hassan Rowan from [facility], patients current assisted living facility, regarding discharge plans. CSW informed Ms. Hassan Rowan that patient was medically/ psych cleared at this time and was ready for discharge. Ms. Hassan Rowan stated she would be able to pick patient up "around 2pm". Ms. Hassan Rowan requested copy of AVS and any new prescriptions. CSW will update RN and NP.   Ms. Hassan Rowan stated she would call CSW once at the ED.   Kingsley Spittle, LCSWA Clinical Social Worker [phone]

## 2016-04-05 NOTE — Consult Note (Signed)
[place] [facility] Consult   Reason for Consult:  Suicidal ideations Referring Physician:  EDP Patient Identification: Tina Saunders MRN:  [id number] Principal Diagnosis: Schizoaffective disorder, bipolar type [facility]) Diagnosis:   Patient Active Problem List   Diagnosis Date Noted  . Schizoaffective disorder, bipolar type ([place]) [F25.0] 05/30/2014    Priority: High  . Suicidal ideation [R45.851]   . 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 Headache [R51] 04/15/2013  . HTN (hypertension) [I10] 04/15/2013  . Post traumatic stress disorder [F43.10] 12/07/2011  . CAP (community acquired pneumonia) [J18.9] 08/27/2011  . Chest pain [R07.9] 08/26/2011  . SOB (shortness of breath) [R06.02] 08/26/2011  . Fever [R50.9] 08/26/2011  . Hypokalemia [E87.6] 08/26/2011  . PSVT (paroxysmal supraventricular tachycardia) ([place]) [I47.1] 08/26/2011  . ADHD [F90.9] 09/23/2007  . EPIGASTRIC PAIN [R10.13] 09/23/2007  . Obesity, unspecified [E66.9] 07/30/2007  . DEPRESSION [F32.9] 07/30/2007  . SLEEP DISORDER [G47.9] 07/30/2007  . IMPAIRED FASTING GLUCOSE [R73.01] 07/30/2007  . FATIGUE [R53.81, R53.83] 11/21/2006  . ABNORMAL FINDINGS, ELEVATED BP W/O HTN [R03.0] 11/21/2006  . METRORRHAGIA [N92.1] 06/13/2006  . DISORDER, MENSTRUAL NEC [N94.9] 06/13/2006  . DIZZINESS [R42] 06/13/2006  . POLYCYSTIC OVARIAN DISEASE [E28.2] 04/25/2006  . AMENORRHEA, SECONDARY [N91.2] 04/20/2006  . ACNE, MILD [L70.8] 04/20/2006  . ABDOMINAL PAIN [R10.9] 04/20/2006    Total Time spent with patient: 45 minutes  Subjective:   Tina Saunders is a 31 y.o. female patient does not warrant admission.  HPI:  31 yo female from a nursing facility presented with suicidal ideations.  Her medications were adjusted and she is not drowsy today.  No suicidal/homicidal ideations, hallucinations, or  Substance abuse.   She is stable to return to the nursing facility with Rx.  Past Psychiatric History: schizoaffective  disorder  Risk to Self: NOne Risk to Others: Homicidal Ideation: No Thoughts of Harm to Others: No Current Homicidal Intent: No Current Homicidal Plan: No Access to Homicidal Means: No Identified Victim: none History of harm to others?: No Assessment of Violence: None Noted Violent Behavior Description: n/a Does patient have access to weapons?: No Criminal Charges Pending?: No Does patient have a court date: No Prior Inpatient Therapy: Prior Inpatient Therapy: Yes Prior Therapy Dates: 2014 Prior Therapy Facilty/Provider(s): [facility] Reason for Treatment: SI Prior Outpatient Therapy: Prior Outpatient Therapy: Yes Prior Therapy Dates: current Prior Therapy Facilty/Provider(s): Provided by asst living facility Reason for Treatment: medication mngmt. Does patient have an ACCT team?: No Does patient have Intensive In-House Services?  : No Does patient have [facility] services? : No Does patient have P4CC services?: No  Past Medical History:  Past Medical History:  Diagnosis Date  . Anxiety   . Bipolar 1 disorder ([place])   . Cancer of abdominal wall   . Depression   . Diabetes mellitus without complication ([place])   . Hypertension   . Obesity   . Obesity   . Polycystic ovarian syndrome 07/01/2011   Patient report  . 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 Schizophrenia [facility])     Past Surgical History:  Procedure Laterality Date  . CHOLECYSTECTOMY    . HERNIA REPAIR    . Ovarian Cyst Excision    . VARICOSE VEIN SURGERY     Family History:  Family History  Problem Relation Age of Onset  . Coronary artery disease Maternal Grandmother   . Diabetes type II Maternal Grandmother   . Cancer Maternal Grandmother   . Hypertension Mother   . Hypertension Father    Family  Psychiatric  History: none Social History:  History  Alcohol Use No     History  Drug Use No    Social History   Social History  . Marital status: Single    Spouse name: N/A  . Number of children: N/A  . Years of  education: N/A   Social History Main Topics  . Smoking status: Never Smoker  . Smokeless tobacco: Never Used  . Alcohol use No  . Drug use: No  . Sexual activity: No   Other Topics Concern  . None   Social History Narrative  . None   Additional Social History:    Allergies:   Allergies  Allergen Reactions  . Fish-Derived Products Anaphylaxis    Can only eat FLounder  . Geodon [Ziprasidone Hcl] Other (See Comments)    Face pulls, cant swallow - Locked Jaw  . Haldol [Haloperidol Lactate] Other (See Comments)    Face pulls, can't swallow - Locked Jaw  . Buprenorphine Hcl Hives, Itching, Rash and Other (See Comments)    GI upset  . Compazine [Prochlorperazine] Other (See Comments)    anxiety and hyperactivity  . Morphine And Related Hives, Itching, Rash and Other (See Comments)    GI upset  . Toradol [Ketorolac Tromethamine] Other (See Comments)    Anxiety and hyperactivity    Labs:  Results for orders placed or performed during the hospital encounter of 04/04/16 (from the past 48 hour(s))  Ethanol     Status: None   Collection Time: 04/04/16  1:16 AM  Result Value Ref Range   Alcohol, Ethyl (B) <5 <5 mg/dL    Comment:        LOWEST DETECTABLE LIMIT FOR SERUM ALCOHOL IS 5 mg/dL FOR MEDICAL PURPOSES ONLY   Salicylate level     Status: None   Collection Time: 04/04/16  1:16 AM  Result Value Ref Range   Salicylate Lvl <7.8 2.8 - 30.0 mg/dL  Acetaminophen level     Status: Abnormal   Collection Time: 04/04/16  1:16 AM  Result Value Ref Range   Acetaminophen (Tylenol), Serum <10 (L) 10 - 30 ug/mL    Comment:        THERAPEUTIC CONCENTRATIONS VARY SIGNIFICANTLY. A RANGE OF 10-30 ug/mL MAY BE AN EFFECTIVE CONCENTRATION FOR MANY PATIENTS. HOWEVER, SOME ARE BEST TREATED AT CONCENTRATIONS OUTSIDE THIS RANGE. ACETAMINOPHEN CONCENTRATIONS >150 ug/mL AT 4 HOURS AFTER INGESTION AND >50 ug/mL AT 12 HOURS AFTER INGESTION ARE OFTEN ASSOCIATED WITH TOXIC REACTIONS.    Comprehensive metabolic panel     Status: Abnormal   Collection Time: 04/04/16  1:22 AM  Result Value Ref Range   Sodium 134 (L) 135 - 145 mmol/L   Potassium 4.0 3.5 - 5.1 mmol/L   Chloride 102 101 - 111 mmol/L   CO2 22 22 - 32 mmol/L   Glucose, Bld 282 (H) 65 - 99 mg/dL   BUN 11 6 - 20 mg/dL   Creatinine, Ser 0.55 0.44 - 1.00 mg/dL   Calcium 9.2 8.9 - 10.3 mg/dL   Total Protein 8.0 6.5 - 8.1 g/dL   Albumin 3.8 3.5 - 5.0 g/dL   AST 63 (H) 15 - 41 U/L   ALT 33 14 - 54 U/L   Alkaline Phosphatase 101 38 - 126 U/L   Total Bilirubin 0.6 0.3 - 1.2 mg/dL   GFR calc non Af Amer >60 >60 mL/min   GFR calc Af Amer >60 >60 mL/min    Comment: (NOTE) The eGFR has been  calculated using the CKD EPI equation. This calculation has not been validated in all clinical situations. eGFR's persistently <60 mL/min signify possible Chronic Kidney Disease.    Anion gap 10 5 - 15  cbc     Status: Abnormal   Collection Time: 04/04/16  1:22 AM  Result Value Ref Range   WBC 5.0 4.0 - 10.5 K/uL   RBC 3.98 3.87 - 5.11 MIL/uL   Hemoglobin 10.5 (L) 12.0 - 15.0 g/dL   HCT 32.8 (L) 36.0 - 46.0 %   MCV 82.4 78.0 - 100.0 fL   MCH 26.4 26.0 - 34.0 pg   MCHC 32.0 30.0 - 36.0 g/dL   RDW 15.1 11.5 - 15.5 %   Platelets 158 150 - 400 K/uL  D-dimer, quantitative (not at [facility])     Status: None   Collection Time: 04/04/16  2:47 AM  Result Value Ref Range   D-Dimer, Quant <0.27 0.00 - 0.50 ug/mL-FEU    Comment: (NOTE) At the manufacturer cut-off of 0.50 ug/mL FEU, this assay has been documented to exclude PE with a sensitivity and negative predictive value of 97 to 99%.  At this time, this assay has not been approved by the FDA to exclude DVT/VTE. Results should be correlated with clinical presentation.   Rapid urine drug screen (hospital performed)     Status: None   Collection Time: 04/05/16  2:27 AM  Result Value Ref Range   Opiates NONE DETECTED NONE DETECTED   Cocaine NONE DETECTED NONE DETECTED    Benzodiazepines NONE DETECTED NONE DETECTED   Amphetamines NONE DETECTED NONE DETECTED   Tetrahydrocannabinol NONE DETECTED NONE DETECTED   Barbiturates NONE DETECTED NONE DETECTED    Comment:        DRUG SCREEN FOR MEDICAL PURPOSES ONLY.  IF CONFIRMATION IS NEEDED FOR ANY PURPOSE, NOTIFY LAB WITHIN 5 DAYS.        LOWEST DETECTABLE LIMITS FOR URINE DRUG SCREEN Drug Class       Cutoff (ng/mL) Amphetamine      1000 Barbiturate      200 Benzodiazepine   366 Tricyclics       294 Opiates          300 Cocaine          300 THC              50   CBG monitoring, ED     Status: Abnormal   Collection Time: 04/05/16  8:04 AM  Result Value Ref Range   Glucose-Capillary 310 (H) 65 - 99 mg/dL   Comment 1 QC Due     Current Facility-Administered Medications  Medication Dose Route Frequency Provider Last Rate Last Dose  . albuterol (PROVENTIL HFA;VENTOLIN HFA) 108 (90 Base) MCG/ACT inhaler 1 puff  1 puff Inhalation Q4H PRN Margarita Mail, PA-C      . benzonatate (TESSALON) capsule 100 mg  100 mg Oral TID PRN Duffy Bruce, MD   100 mg at 04/04/16 2107  . busPIRone (BUSPAR) tablet 15 mg  15 mg Oral TID Corena Pilgrim, MD   15 mg at 04/05/16 1030  . cloNIDine (CATAPRES) tablet 0.1 mg  0.1 mg Oral BID Margarita Mail, PA-C   0.1 mg at 04/05/16 1030  . cyclobenzaprine (FLEXERIL) tablet 5 mg  5 mg Oral BID Margarita Mail, PA-C   5 mg at 04/05/16 1029  . dextromethorphan-guaiFENesin (MUCINEX DM) 30-600 MG per 12 hr tablet 1 tablet  1 tablet Oral BID PRN Duffy Bruce, MD      .  dicyclomine (BENTYL) tablet 20 mg  20 mg Oral Q8H PRN Margarita Mail, PA-C      . docusate sodium (COLACE) capsule 100 mg  100 mg Oral BID Margarita Mail, PA-C      . fenofibrate tablet 54 mg  54 mg Oral Daily Margarita Mail, PA-C      . gabapentin (NEURONTIN) capsule 400 mg  400 mg Oral TID Corena Pilgrim, MD   400 mg at 04/05/16 1029  . insulin aspart (novoLOG) injection 0-20 Units  0-20 Units Subcutaneous Q4H Margarita Mail, PA-C   15 Units at 04/05/16 0815  . lactulose (CHRONULAC) 10 GM/15ML solution 20 g  20 g Oral BID Margarita Mail, PA-C      . lisinopril (PRINIVIL,ZESTRIL) tablet 10 mg  10 mg Oral Daily Margarita Mail, PA-C      . norgestimate-ethinyl estradiol (ORTHO-CYCLEN,SPRINTEC,PREVIFEM) 0.25-35 MG-MCG tablet 1 tablet  1 tablet Oral Daily Abigail Harris, PA-C      . pantoprazole (PROTONIX) EC tablet 40 mg  40 mg Oral QHS Abigail Harris, PA-C      . QUEtiapine (SEROQUEL) tablet 300 mg  300 mg Oral BID Corena Pilgrim, MD   300 mg at 04/05/16 1031  . sertraline (ZOLOFT) tablet 200 mg  200 mg Oral Talmadge Chad, MD   200 mg at 04/05/16 0711  . simvastatin (ZOCOR) tablet 10 mg  10 mg Oral QHS Margarita Mail, PA-C      . topiramate (TOPAMAX) tablet 100 mg  100 mg Oral BID Corena Pilgrim, MD   100 mg at 04/05/16 1031  . traZODone (DESYREL) tablet 25 mg  25 mg Oral See admin instructions Duffy Bruce, MD   25 mg at 04/04/16 2108  . [START ON 04/06/2016] Vitamin D (Ergocalciferol) (DRISDOL) capsule 50,000 Units  50,000 Units Oral Q Thu Margarita Mail, PA-C       Current Outpatient Prescriptions  Medication Sig Dispense Refill  . albuterol (PROVENTIL HFA;VENTOLIN HFA) 108 (90 Base) MCG/ACT inhaler Inhale 1 puff into the lungs every 4 (four) hours as needed for wheezing or shortness of breath.    Marland Kitchen amoxicillin-clavulanate (AUGMENTIN) 875-125 MG tablet Take 1 tablet by mouth 2 (two) times daily.  0  . busPIRone (BUSPAR) 15 MG tablet Take 15 mg by mouth 3 (three) times daily.    . chlorproMAZINE (THORAZINE) 25 MG tablet Take 50 mg by mouth 3 (three) times daily.     . cloNIDine (CATAPRES) 0.1 MG tablet Take 0.1 mg by mouth 2 (two) times daily.    . cyclobenzaprine (FLEXERIL) 5 MG tablet Take 5 mg by mouth 2 (two) times daily.    Marland Kitchen dicyclomine (BENTYL) 20 MG tablet Take 20 mg by mouth every 8 (eight) hours as needed for spasms.    Marland Kitchen docusate sodium (COLACE) 100 MG capsule Take 100 mg by mouth 2  (two) times daily.    . fenofibrate (TRICOR) 145 MG tablet Take 145 mg by mouth daily.    Marland Kitchen gabapentin (NEURONTIN) 800 MG tablet Take 800 mg by mouth 3 (three) times daily.    Marland Kitchen guaiFENesin (MUCINEX) 600 MG 12 hr tablet Take 600 mg by mouth 2 (two) times daily.    Marland Kitchen ibuprofen (ADVIL,MOTRIN) 800 MG tablet Take 1 tablet (800 mg total) by mouth every 8 (eight) hours as needed for moderate pain. 30 tablet 0  . insulin aspart (NOVOLOG FLEXPEN) 100 UNIT/ML FlexPen Inject 22 Units into the skin 3 (three) times daily with meals.    . insulin detemir (LEVEMIR)  100 UNIT/ML injection Inject 57 Units into the skin 2 (two) times daily.     Marland Kitchen lactulose (CHRONULAC) 10 GM/15ML solution Take 20 g by mouth 2 (two) times daily.    Marland Kitchen lisinopril (PRINIVIL,ZESTRIL) 10 MG tablet Take 10 mg by mouth daily.    Marland Kitchen LORazepam (ATIVAN) 0.5 MG tablet Take 0.5 mg by mouth daily at 12 noon.    . meloxicam (MOBIC) 7.5 MG tablet Take 7.5 mg by mouth 2 (two) times daily.    . norgestimate-ethinyl estradiol (ORTHO-CYCLEN,SPRINTEC,PREVIFEM) 0.25-35 MG-MCG tablet Take 1 tablet by mouth daily. 1 Package 11  . pantoprazole (PROTONIX) 40 MG tablet Take 40 mg by mouth at bedtime.    . polyethylene glycol (MIRALAX / GLYCOLAX) packet Take 17 g by mouth daily.    . QUEtiapine (SEROQUEL) 400 MG tablet Take 800 mg by mouth at bedtime.    . sertraline (ZOLOFT) 100 MG tablet Take 200 mg by mouth every morning.     . simvastatin (ZOCOR) 10 MG tablet Take 10 mg by mouth at bedtime.    . topiramate (TOPAMAX) 25 MG tablet Take 25 mg by mouth 2 (two) times daily.    . traMADol (ULTRAM) 50 MG tablet Take 25 mg by mouth every 12 (twelve) hours as needed for moderate pain.    . traZODone (DESYREL) 50 MG tablet Take 25 mg by mouth See admin instructions. Takes 1/2 tab at 0800, 1200, then takes 2 tabs at bedtime 2000    . Vitamin D, Ergocalciferol, (DRISDOL) 50000 units CAPS capsule Take 50,000 Units by mouth every Thursday.        Musculoskeletal: Strength & Muscle Tone: within normal limits Gait & Station: normal Patient leans: N/A  Psychiatric Specialty Exam: Physical Exam  Constitutional: She is oriented to person, place, and time. She appears well-developed and well-nourished.  HENT:  Head: Normocephalic.  Neck: Normal range of motion.  Respiratory: Effort normal.  Musculoskeletal: Normal range of motion.  Neurological: She is alert and oriented to person, place, and time.  Psychiatric: Her speech is normal and behavior is normal. Judgment and thought content normal. Cognition and memory are normal. She exhibits a depressed mood.    Review of Systems  Psychiatric/Behavioral: Positive for depression.  All other systems reviewed and are negative.   Blood pressure 135/81, pulse 100, temperature 97.7 F (36.5 C), resp. rate 16, last menstrual period 03/08/2016, SpO2 95 %.There is no height or weight on file to calculate BMI.  General Appearance: Casual  Eye Contact:  Good  Speech:  Normal Rate  Volume:  Normal  Mood:  Depressed, mild  Affect:  Congruent  Thought Process:  Coherent and Descriptions of Associations: Intact  Orientation:  Full (Time, Place, and Person)  Thought Content:  WDL and Logical  Suicidal Thoughts:  No  Homicidal Thoughts:  No  Memory:  Immediate;   Good Recent;   Good Remote;   Good  Judgement:  Fair  Insight:  Fair  Psychomotor Activity:  Normal  Concentration:  Concentration: Good and Attention Span: Good  Recall:  Good  Fund of Knowledge:  Fair  Language:  Good  Akathisia:  No  Handed:  Right  AIMS (if indicated):     Assets:  Housing Leisure Time Resilience Social Support  ADL's:  Intact  Cognition:  WNL  Sleep:        Treatment Plan Summary: Daily contact with patient to assess and evaluate symptoms and progress in treatment, Medication management and Plan schizoaffective disorder, bipolar  type:  -Crisis stabilization -Medication management:   Continued medical medications along with her Zoloft 200 mg daily for depression, Trazodone 25 mg at bedtime for sleep, and Buspar 15 mg TID for anxiety.  Discontinued Thorazine and Ativan.  Changed Gabapentin 800 mg TID to 400 mg TID for mood stabilization, Seroquel 800 mg at bedtime to 300 mg BID for mood stabilization, and Topamax 25 mg BID to 100 mg BID for mood stabilization -Individual counseling  Disposition: No evidence of imminent risk to self or others at present.    Waylan Boga, NP 04/05/2016 10:32 AM  Patient seen face-to-face for psychiatric evaluation, chart reviewed and case discussed with the physician extender and developed treatment plan. Reviewed the information documented and agree with the treatment plan. Corena Pilgrim, MD

## 2016-04-05 NOTE — BH Assessment (Signed)
[place] Assessment Progress Note  Per Corena Pilgrim, MD, this pt does not require psychiatric hospitalization at this time.  Pt presents under IVC initiated by EDP Dorie Rank, MD, which Dr Darleene Cleaver has rescinded.  Pt is to be [facility] from [facility] to return to her residential facility.  Kingsley Spittle, LCSW will be asked to facilitate pt's return.  Pt does not need any referral information.  Pt's nurse, Magda Paganini, has been notified.  Jalene Mullet, [place] Triage Specialist [phone]

## 2016-04-05 NOTE — ED Notes (Signed)
Pt stated "I take Levemir and novolog but ya'll don't have my updated med list.  I live in a a NH because I can't take care of myself.  I think about hurting myself because of my anxiety."

## 2016-04-05 NOTE — [facility] Suicide Risk Assessment (Signed)
Suicide Risk Assessment  Discharge Assessment   [facility] Discharge Suicide Risk Assessment   Principal Problem: Schizoaffective disorder, bipolar type [facility]) Discharge Diagnoses:  Patient Active Problem List   Diagnosis Date Noted  . Schizoaffective disorder, bipolar type ([place]) [F25.0] 05/30/2014    Priority: High  . Suicidal ideation [R45.851]   . 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 Headache [R51] 04/15/2013  . HTN (hypertension) [I10] 04/15/2013  . Post traumatic stress disorder [F43.10] 12/07/2011  . CAP (community acquired pneumonia) [J18.9] 08/27/2011  . Chest pain [R07.9] 08/26/2011  . SOB (shortness of breath) [R06.02] 08/26/2011  . Fever [R50.9] 08/26/2011  . Hypokalemia [E87.6] 08/26/2011  . PSVT (paroxysmal supraventricular tachycardia) ([place]) [I47.1] 08/26/2011  . ADHD [F90.9] 09/23/2007  . EPIGASTRIC PAIN [R10.13] 09/23/2007  . Obesity, unspecified [E66.9] 07/30/2007  . DEPRESSION [F32.9] 07/30/2007  . SLEEP DISORDER [G47.9] 07/30/2007  . IMPAIRED FASTING GLUCOSE [R73.01] 07/30/2007  . FATIGUE [R53.81, R53.83] 11/21/2006  . ABNORMAL FINDINGS, ELEVATED BP W/O HTN [R03.0] 11/21/2006  . METRORRHAGIA [N92.1] 06/13/2006  . DISORDER, MENSTRUAL NEC [N94.9] 06/13/2006  . DIZZINESS [R42] 06/13/2006  . POLYCYSTIC OVARIAN DISEASE [E28.2] 04/25/2006  . AMENORRHEA, SECONDARY [N91.2] 04/20/2006  . ACNE, MILD [L70.8] 04/20/2006  . ABDOMINAL PAIN [R10.9] 04/20/2006    Total Time spent with patient: 45 minutes  Musculoskeletal: Strength & Muscle Tone: within normal limits Gait & Station: normal Patient leans: N/A  Psychiatric Specialty Exam: Physical Exam  ROS  Blood pressure 135/81, pulse 100, temperature 97.7 F (36.5 C), resp. rate 16, last menstrual period 03/08/2016, SpO2 95 %.There is no height or weight on file to calculate BMI.  General Appearance: Casual  Eye Contact:  Good  Speech:  Normal Rate  Volume:  Normal  Mood:  Depressed, mild  Affect:   Congruent  Thought Process:  Coherent and Descriptions of Associations: Intact  Orientation:  Full (Time, Place, and Person)  Thought Content:  WDL and Logical  Suicidal Thoughts:  No  Homicidal Thoughts:  No  Memory:  Immediate;   Good Recent;   Good Remote;   Good  Judgement:  Fair  Insight:  Fair  Psychomotor Activity:  Normal  Concentration:  Concentration: Good and Attention Span: Good  Recall:  Good  Fund of Knowledge:  Fair  Language:  Good  Akathisia:  No  Handed:  Right  AIMS (if indicated):     Assets:  Housing Leisure Time Resilience Social Support  ADL's:  Intact  Cognition:  WNL  Sleep:       Mental Status Per Nursing Assessment::   On Admission:   suicidal ideations  Demographic Factors:  Caucasian  Loss Factors: NA  Historical Factors: NA  Risk Reduction Factors:   Sense of responsibility to family, Living with another person, especially a relative, Positive social support and Positive therapeutic relationship  Continued Clinical Symptoms:  Depression, moderate  Cognitive Features That Contribute To Risk:  None    Suicide Risk:  Minimal: No identifiable suicidal ideation.  Patients presenting with no risk factors but with morbid ruminations; may be classified as minimal risk based on the severity of the depressive symptoms    Plan Of Care/Follow-up recommendations:  Activity:  as tolerated Diet:  heart healthy diet  LORD, JAMISON, NP 04/05/2016, 10:39 AM

## 2016-04-06 DIAGNOSIS — F259 Schizoaffective disorder, unspecified: Secondary | ICD-10-CM | POA: Diagnosis not present

## 2016-04-07 DIAGNOSIS — F259 Schizoaffective disorder, unspecified: Secondary | ICD-10-CM | POA: Diagnosis not present

## 2016-04-08 DIAGNOSIS — F259 Schizoaffective disorder, unspecified: Secondary | ICD-10-CM | POA: Diagnosis not present

## 2016-04-09 DIAGNOSIS — F259 Schizoaffective disorder, unspecified: Secondary | ICD-10-CM | POA: Diagnosis not present

## 2016-04-10 DIAGNOSIS — F259 Schizoaffective disorder, unspecified: Secondary | ICD-10-CM | POA: Diagnosis not present

## 2016-04-11 DIAGNOSIS — F259 Schizoaffective disorder, unspecified: Secondary | ICD-10-CM | POA: Diagnosis not present

## 2016-04-15 DIAGNOSIS — F259 Schizoaffective disorder, unspecified: Secondary | ICD-10-CM | POA: Diagnosis not present

## 2016-04-16 DIAGNOSIS — F259 Schizoaffective disorder, unspecified: Secondary | ICD-10-CM | POA: Diagnosis not present

## 2016-04-17 DIAGNOSIS — M25559 Pain in unspecified hip: Secondary | ICD-10-CM | POA: Diagnosis not present

## 2016-04-17 DIAGNOSIS — F259 Schizoaffective disorder, unspecified: Secondary | ICD-10-CM | POA: Diagnosis not present

## 2016-04-18 DIAGNOSIS — F339 Major depressive disorder, recurrent, unspecified: Secondary | ICD-10-CM | POA: Diagnosis not present

## 2016-04-18 DIAGNOSIS — G4700 Insomnia, unspecified: Secondary | ICD-10-CM | POA: Diagnosis not present

## 2016-04-18 DIAGNOSIS — F259 Schizoaffective disorder, unspecified: Secondary | ICD-10-CM | POA: Diagnosis not present

## 2016-04-18 DIAGNOSIS — F250 Schizoaffective disorder, bipolar type: Secondary | ICD-10-CM | POA: Diagnosis not present

## 2016-04-18 DIAGNOSIS — F419 Anxiety disorder, unspecified: Secondary | ICD-10-CM | POA: Diagnosis not present

## 2016-04-19 DIAGNOSIS — J4540 Moderate persistent asthma, uncomplicated: Secondary | ICD-10-CM | POA: Diagnosis not present

## 2016-04-19 DIAGNOSIS — G4733 Obstructive sleep apnea (adult) (pediatric): Secondary | ICD-10-CM | POA: Diagnosis not present

## 2016-04-19 DIAGNOSIS — R0600 Dyspnea, unspecified: Secondary | ICD-10-CM | POA: Diagnosis not present

## 2016-04-19 DIAGNOSIS — R5383 Other fatigue: Secondary | ICD-10-CM | POA: Diagnosis not present

## 2016-04-24 DIAGNOSIS — F259 Schizoaffective disorder, unspecified: Secondary | ICD-10-CM | POA: Diagnosis not present

## 2016-04-25 DIAGNOSIS — F259 Schizoaffective disorder, unspecified: Secondary | ICD-10-CM | POA: Diagnosis not present

## 2016-04-26 DIAGNOSIS — F259 Schizoaffective disorder, unspecified: Secondary | ICD-10-CM | POA: Diagnosis not present

## 2016-04-30 DIAGNOSIS — F259 Schizoaffective disorder, unspecified: Secondary | ICD-10-CM | POA: Diagnosis not present

## 2016-05-01 DIAGNOSIS — F259 Schizoaffective disorder, unspecified: Secondary | ICD-10-CM | POA: Diagnosis not present

## 2016-05-02 DIAGNOSIS — F259 Schizoaffective disorder, unspecified: Secondary | ICD-10-CM | POA: Diagnosis not present

## 2016-05-02 DIAGNOSIS — F250 Schizoaffective disorder, bipolar type: Secondary | ICD-10-CM | POA: Diagnosis not present

## 2016-05-02 DIAGNOSIS — G4700 Insomnia, unspecified: Secondary | ICD-10-CM | POA: Diagnosis not present

## 2016-05-02 DIAGNOSIS — F419 Anxiety disorder, unspecified: Secondary | ICD-10-CM | POA: Diagnosis not present

## 2016-05-02 DIAGNOSIS — F339 Major depressive disorder, recurrent, unspecified: Secondary | ICD-10-CM | POA: Diagnosis not present

## 2016-05-03 DIAGNOSIS — F259 Schizoaffective disorder, unspecified: Secondary | ICD-10-CM | POA: Diagnosis not present

## 2016-05-03 DIAGNOSIS — R197 Diarrhea, unspecified: Secondary | ICD-10-CM | POA: Diagnosis not present

## 2016-05-03 DIAGNOSIS — E1165 Type 2 diabetes mellitus with hyperglycemia: Secondary | ICD-10-CM | POA: Diagnosis not present

## 2016-05-04 DIAGNOSIS — F259 Schizoaffective disorder, unspecified: Secondary | ICD-10-CM | POA: Diagnosis not present

## 2016-05-04 DIAGNOSIS — R195 Other fecal abnormalities: Secondary | ICD-10-CM | POA: Diagnosis not present

## 2016-05-05 DIAGNOSIS — F259 Schizoaffective disorder, unspecified: Secondary | ICD-10-CM | POA: Diagnosis not present

## 2016-05-06 DIAGNOSIS — F259 Schizoaffective disorder, unspecified: Secondary | ICD-10-CM | POA: Diagnosis not present

## 2016-05-07 DIAGNOSIS — F259 Schizoaffective disorder, unspecified: Secondary | ICD-10-CM | POA: Diagnosis not present

## 2016-05-09 DIAGNOSIS — F259 Schizoaffective disorder, unspecified: Secondary | ICD-10-CM | POA: Diagnosis not present

## 2016-05-10 DIAGNOSIS — F259 Schizoaffective disorder, unspecified: Secondary | ICD-10-CM | POA: Diagnosis not present

## 2016-05-11 DIAGNOSIS — F259 Schizoaffective disorder, unspecified: Secondary | ICD-10-CM | POA: Diagnosis not present

## 2016-05-11 DIAGNOSIS — R195 Other fecal abnormalities: Secondary | ICD-10-CM | POA: Diagnosis not present

## 2016-05-12 DIAGNOSIS — F259 Schizoaffective disorder, unspecified: Secondary | ICD-10-CM | POA: Diagnosis not present

## 2016-05-13 DIAGNOSIS — F259 Schizoaffective disorder, unspecified: Secondary | ICD-10-CM | POA: Diagnosis not present

## 2016-05-14 DIAGNOSIS — F259 Schizoaffective disorder, unspecified: Secondary | ICD-10-CM | POA: Diagnosis not present

## 2016-05-15 DIAGNOSIS — E782 Mixed hyperlipidemia: Secondary | ICD-10-CM | POA: Diagnosis not present

## 2016-05-15 DIAGNOSIS — E119 Type 2 diabetes mellitus without complications: Secondary | ICD-10-CM | POA: Diagnosis not present

## 2016-05-15 DIAGNOSIS — F259 Schizoaffective disorder, unspecified: Secondary | ICD-10-CM | POA: Diagnosis not present

## 2016-05-15 DIAGNOSIS — E559 Vitamin D deficiency, unspecified: Secondary | ICD-10-CM | POA: Diagnosis not present

## 2016-05-15 DIAGNOSIS — D518 Other vitamin B12 deficiency anemias: Secondary | ICD-10-CM | POA: Diagnosis not present

## 2016-05-15 DIAGNOSIS — Z79899 Other long term (current) drug therapy: Secondary | ICD-10-CM | POA: Diagnosis not present

## 2016-05-15 DIAGNOSIS — E038 Other specified hypothyroidism: Secondary | ICD-10-CM | POA: Diagnosis not present

## 2016-05-16 DIAGNOSIS — F259 Schizoaffective disorder, unspecified: Secondary | ICD-10-CM | POA: Diagnosis not present

## 2016-05-17 DIAGNOSIS — K5900 Constipation, unspecified: Secondary | ICD-10-CM | POA: Diagnosis not present

## 2016-05-17 DIAGNOSIS — E1165 Type 2 diabetes mellitus with hyperglycemia: Secondary | ICD-10-CM | POA: Diagnosis not present

## 2016-05-17 DIAGNOSIS — F259 Schizoaffective disorder, unspecified: Secondary | ICD-10-CM | POA: Diagnosis not present

## 2016-05-21 DIAGNOSIS — F259 Schizoaffective disorder, unspecified: Secondary | ICD-10-CM | POA: Diagnosis not present

## 2016-05-23 DIAGNOSIS — R946 Abnormal results of thyroid function studies: Secondary | ICD-10-CM | POA: Diagnosis not present

## 2016-05-23 DIAGNOSIS — Z86718 Personal history of other venous thrombosis and embolism: Secondary | ICD-10-CM | POA: Diagnosis not present

## 2016-05-23 DIAGNOSIS — F251 Schizoaffective disorder, depressive type: Secondary | ICD-10-CM | POA: Diagnosis not present

## 2016-05-23 DIAGNOSIS — R4183 Borderline intellectual functioning: Secondary | ICD-10-CM | POA: Diagnosis not present

## 2016-05-23 DIAGNOSIS — C499 Malignant neoplasm of connective and soft tissue, unspecified: Secondary | ICD-10-CM | POA: Diagnosis not present

## 2016-05-23 DIAGNOSIS — F209 Schizophrenia, unspecified: Secondary | ICD-10-CM | POA: Diagnosis not present

## 2016-05-23 DIAGNOSIS — E1142 Type 2 diabetes mellitus with diabetic polyneuropathy: Secondary | ICD-10-CM | POA: Diagnosis not present

## 2016-05-23 DIAGNOSIS — Z885 Allergy status to narcotic agent status: Secondary | ICD-10-CM | POA: Diagnosis not present

## 2016-05-23 DIAGNOSIS — F259 Schizoaffective disorder, unspecified: Secondary | ICD-10-CM | POA: Diagnosis not present

## 2016-05-23 DIAGNOSIS — F332 Major depressive disorder, recurrent severe without psychotic features: Secondary | ICD-10-CM | POA: Diagnosis not present

## 2016-05-23 DIAGNOSIS — R45851 Suicidal ideations: Secondary | ICD-10-CM | POA: Diagnosis not present

## 2016-05-24 DIAGNOSIS — F259 Schizoaffective disorder, unspecified: Secondary | ICD-10-CM | POA: Diagnosis not present

## 2016-05-24 DIAGNOSIS — E1165 Type 2 diabetes mellitus with hyperglycemia: Secondary | ICD-10-CM | POA: Diagnosis not present

## 2016-05-24 DIAGNOSIS — R45851 Suicidal ideations: Secondary | ICD-10-CM | POA: Diagnosis not present

## 2016-05-25 DIAGNOSIS — Z794 Long term (current) use of insulin: Secondary | ICD-10-CM | POA: Diagnosis not present

## 2016-05-25 DIAGNOSIS — F319 Bipolar disorder, unspecified: Secondary | ICD-10-CM | POA: Diagnosis not present

## 2016-05-25 DIAGNOSIS — F259 Schizoaffective disorder, unspecified: Secondary | ICD-10-CM | POA: Diagnosis not present

## 2016-05-25 DIAGNOSIS — I10 Essential (primary) hypertension: Secondary | ICD-10-CM | POA: Diagnosis not present

## 2016-05-25 DIAGNOSIS — E1165 Type 2 diabetes mellitus with hyperglycemia: Secondary | ICD-10-CM | POA: Diagnosis not present

## 2016-05-25 DIAGNOSIS — M791 Myalgia: Secondary | ICD-10-CM | POA: Diagnosis not present

## 2016-05-25 DIAGNOSIS — R32 Unspecified urinary incontinence: Secondary | ICD-10-CM | POA: Diagnosis not present

## 2016-05-29 DIAGNOSIS — F259 Schizoaffective disorder, unspecified: Secondary | ICD-10-CM | POA: Diagnosis not present

## 2016-05-30 DIAGNOSIS — F259 Schizoaffective disorder, unspecified: Secondary | ICD-10-CM | POA: Diagnosis not present

## 2016-05-31 DIAGNOSIS — F259 Schizoaffective disorder, unspecified: Secondary | ICD-10-CM | POA: Diagnosis not present

## 2016-06-01 DIAGNOSIS — F259 Schizoaffective disorder, unspecified: Secondary | ICD-10-CM | POA: Diagnosis not present

## 2016-06-02 DIAGNOSIS — R109 Unspecified abdominal pain: Secondary | ICD-10-CM | POA: Diagnosis not present

## 2016-06-02 DIAGNOSIS — R51 Headache: Secondary | ICD-10-CM | POA: Diagnosis not present

## 2016-06-02 DIAGNOSIS — G8911 Acute pain due to trauma: Secondary | ICD-10-CM | POA: Diagnosis not present

## 2016-06-02 DIAGNOSIS — R1011 Right upper quadrant pain: Secondary | ICD-10-CM | POA: Diagnosis not present

## 2016-06-02 DIAGNOSIS — R58 Hemorrhage, not elsewhere classified: Secondary | ICD-10-CM | POA: Diagnosis not present

## 2016-06-02 DIAGNOSIS — R1031 Right lower quadrant pain: Secondary | ICD-10-CM | POA: Diagnosis not present

## 2016-06-02 DIAGNOSIS — F259 Schizoaffective disorder, unspecified: Secondary | ICD-10-CM | POA: Diagnosis not present

## 2016-06-03 DIAGNOSIS — F259 Schizoaffective disorder, unspecified: Secondary | ICD-10-CM | POA: Diagnosis not present

## 2016-06-04 DIAGNOSIS — F259 Schizoaffective disorder, unspecified: Secondary | ICD-10-CM | POA: Diagnosis not present

## 2016-06-05 DIAGNOSIS — F259 Schizoaffective disorder, unspecified: Secondary | ICD-10-CM | POA: Diagnosis not present

## 2016-06-06 DIAGNOSIS — F339 Major depressive disorder, recurrent, unspecified: Secondary | ICD-10-CM | POA: Diagnosis not present

## 2016-06-06 DIAGNOSIS — F419 Anxiety disorder, unspecified: Secondary | ICD-10-CM | POA: Diagnosis not present

## 2016-06-06 DIAGNOSIS — F250 Schizoaffective disorder, bipolar type: Secondary | ICD-10-CM | POA: Diagnosis not present

## 2016-06-06 DIAGNOSIS — G4700 Insomnia, unspecified: Secondary | ICD-10-CM | POA: Diagnosis not present

## 2016-06-06 DIAGNOSIS — F259 Schizoaffective disorder, unspecified: Secondary | ICD-10-CM | POA: Diagnosis not present

## 2016-06-07 DIAGNOSIS — E1165 Type 2 diabetes mellitus with hyperglycemia: Secondary | ICD-10-CM | POA: Diagnosis not present

## 2016-06-07 DIAGNOSIS — S8011XD Contusion of right lower leg, subsequent encounter: Secondary | ICD-10-CM | POA: Diagnosis not present

## 2016-06-07 DIAGNOSIS — I10 Essential (primary) hypertension: Secondary | ICD-10-CM | POA: Diagnosis not present

## 2016-06-07 DIAGNOSIS — G629 Polyneuropathy, unspecified: Secondary | ICD-10-CM | POA: Diagnosis not present

## 2016-06-07 DIAGNOSIS — F259 Schizoaffective disorder, unspecified: Secondary | ICD-10-CM | POA: Diagnosis not present

## 2016-06-11 DIAGNOSIS — F259 Schizoaffective disorder, unspecified: Secondary | ICD-10-CM | POA: Diagnosis not present

## 2016-06-12 DIAGNOSIS — F259 Schizoaffective disorder, unspecified: Secondary | ICD-10-CM | POA: Diagnosis not present

## 2016-06-12 DIAGNOSIS — E119 Type 2 diabetes mellitus without complications: Secondary | ICD-10-CM | POA: Diagnosis not present

## 2016-06-12 DIAGNOSIS — Z79899 Other long term (current) drug therapy: Secondary | ICD-10-CM | POA: Diagnosis not present

## 2016-06-13 DIAGNOSIS — F259 Schizoaffective disorder, unspecified: Secondary | ICD-10-CM | POA: Diagnosis not present

## 2016-06-14 DIAGNOSIS — F259 Schizoaffective disorder, unspecified: Secondary | ICD-10-CM | POA: Diagnosis not present

## 2016-06-18 DIAGNOSIS — T169XXA Foreign body in ear, unspecified ear, initial encounter: Secondary | ICD-10-CM | POA: Diagnosis not present

## 2016-06-18 DIAGNOSIS — T161XXA Foreign body in right ear, initial encounter: Secondary | ICD-10-CM | POA: Diagnosis not present

## 2016-06-18 DIAGNOSIS — S0991XA Unspecified injury of ear, initial encounter: Secondary | ICD-10-CM | POA: Diagnosis not present

## 2016-06-18 DIAGNOSIS — F259 Schizoaffective disorder, unspecified: Secondary | ICD-10-CM | POA: Diagnosis not present

## 2016-06-19 DIAGNOSIS — F259 Schizoaffective disorder, unspecified: Secondary | ICD-10-CM | POA: Diagnosis not present

## 2016-06-20 DIAGNOSIS — F259 Schizoaffective disorder, unspecified: Secondary | ICD-10-CM | POA: Diagnosis not present

## 2016-06-21 DIAGNOSIS — F259 Schizoaffective disorder, unspecified: Secondary | ICD-10-CM | POA: Diagnosis not present

## 2016-06-21 DIAGNOSIS — E1165 Type 2 diabetes mellitus with hyperglycemia: Secondary | ICD-10-CM | POA: Diagnosis not present

## 2016-06-21 DIAGNOSIS — T169XXD Foreign body in ear, unspecified ear, subsequent encounter: Secondary | ICD-10-CM | POA: Diagnosis not present

## 2016-06-22 DIAGNOSIS — F259 Schizoaffective disorder, unspecified: Secondary | ICD-10-CM | POA: Diagnosis not present

## 2016-06-25 DIAGNOSIS — F259 Schizoaffective disorder, unspecified: Secondary | ICD-10-CM | POA: Diagnosis not present

## 2016-06-26 DIAGNOSIS — F259 Schizoaffective disorder, unspecified: Secondary | ICD-10-CM | POA: Diagnosis not present

## 2016-06-27 DIAGNOSIS — F259 Schizoaffective disorder, unspecified: Secondary | ICD-10-CM | POA: Diagnosis not present

## 2016-06-28 DIAGNOSIS — E1165 Type 2 diabetes mellitus with hyperglycemia: Secondary | ICD-10-CM | POA: Diagnosis not present

## 2016-06-28 DIAGNOSIS — G629 Polyneuropathy, unspecified: Secondary | ICD-10-CM | POA: Diagnosis not present

## 2016-06-28 DIAGNOSIS — I10 Essential (primary) hypertension: Secondary | ICD-10-CM | POA: Diagnosis not present

## 2016-06-28 DIAGNOSIS — F259 Schizoaffective disorder, unspecified: Secondary | ICD-10-CM | POA: Diagnosis not present

## 2016-06-29 DIAGNOSIS — F259 Schizoaffective disorder, unspecified: Secondary | ICD-10-CM | POA: Diagnosis not present

## 2016-06-30 DIAGNOSIS — F259 Schizoaffective disorder, unspecified: Secondary | ICD-10-CM | POA: Diagnosis not present

## 2016-07-02 DIAGNOSIS — F259 Schizoaffective disorder, unspecified: Secondary | ICD-10-CM | POA: Diagnosis not present

## 2016-07-03 DIAGNOSIS — F259 Schizoaffective disorder, unspecified: Secondary | ICD-10-CM | POA: Diagnosis not present

## 2016-07-04 DIAGNOSIS — F259 Schizoaffective disorder, unspecified: Secondary | ICD-10-CM | POA: Diagnosis not present

## 2016-07-05 DIAGNOSIS — F319 Bipolar disorder, unspecified: Secondary | ICD-10-CM | POA: Diagnosis not present

## 2016-07-05 DIAGNOSIS — Z794 Long term (current) use of insulin: Secondary | ICD-10-CM | POA: Diagnosis not present

## 2016-07-05 DIAGNOSIS — M791 Myalgia: Secondary | ICD-10-CM | POA: Diagnosis not present

## 2016-07-05 DIAGNOSIS — I10 Essential (primary) hypertension: Secondary | ICD-10-CM | POA: Diagnosis not present

## 2016-07-05 DIAGNOSIS — E1165 Type 2 diabetes mellitus with hyperglycemia: Secondary | ICD-10-CM | POA: Diagnosis not present

## 2016-07-05 DIAGNOSIS — F259 Schizoaffective disorder, unspecified: Secondary | ICD-10-CM | POA: Diagnosis not present

## 2016-07-05 DIAGNOSIS — R32 Unspecified urinary incontinence: Secondary | ICD-10-CM | POA: Diagnosis not present

## 2016-07-06 DIAGNOSIS — F259 Schizoaffective disorder, unspecified: Secondary | ICD-10-CM | POA: Diagnosis not present

## 2016-07-09 DIAGNOSIS — F259 Schizoaffective disorder, unspecified: Secondary | ICD-10-CM | POA: Diagnosis not present

## 2016-07-10 DIAGNOSIS — F259 Schizoaffective disorder, unspecified: Secondary | ICD-10-CM | POA: Diagnosis not present

## 2016-07-11 DIAGNOSIS — F259 Schizoaffective disorder, unspecified: Secondary | ICD-10-CM | POA: Diagnosis not present

## 2016-07-11 DIAGNOSIS — B351 Tinea unguium: Secondary | ICD-10-CM | POA: Diagnosis not present

## 2016-07-11 DIAGNOSIS — Z794 Long term (current) use of insulin: Secondary | ICD-10-CM | POA: Diagnosis not present

## 2016-07-11 DIAGNOSIS — E1140 Type 2 diabetes mellitus with diabetic neuropathy, unspecified: Secondary | ICD-10-CM | POA: Diagnosis not present

## 2016-07-12 DIAGNOSIS — F259 Schizoaffective disorder, unspecified: Secondary | ICD-10-CM | POA: Diagnosis not present

## 2016-07-13 DIAGNOSIS — N390 Urinary tract infection, site not specified: Secondary | ICD-10-CM | POA: Diagnosis not present

## 2016-07-16 DIAGNOSIS — F259 Schizoaffective disorder, unspecified: Secondary | ICD-10-CM | POA: Diagnosis not present

## 2016-07-17 DIAGNOSIS — F259 Schizoaffective disorder, unspecified: Secondary | ICD-10-CM | POA: Diagnosis not present

## 2016-07-22 DIAGNOSIS — F259 Schizoaffective disorder, unspecified: Secondary | ICD-10-CM | POA: Diagnosis not present

## 2016-07-23 DIAGNOSIS — F259 Schizoaffective disorder, unspecified: Secondary | ICD-10-CM | POA: Diagnosis not present

## 2016-07-24 DIAGNOSIS — F259 Schizoaffective disorder, unspecified: Secondary | ICD-10-CM | POA: Diagnosis not present

## 2016-07-24 DIAGNOSIS — M791 Myalgia: Secondary | ICD-10-CM | POA: Diagnosis not present

## 2016-07-24 DIAGNOSIS — E1165 Type 2 diabetes mellitus with hyperglycemia: Secondary | ICD-10-CM | POA: Diagnosis not present

## 2016-07-24 DIAGNOSIS — Z794 Long term (current) use of insulin: Secondary | ICD-10-CM | POA: Diagnosis not present

## 2016-07-24 DIAGNOSIS — R32 Unspecified urinary incontinence: Secondary | ICD-10-CM | POA: Diagnosis not present

## 2016-07-24 DIAGNOSIS — F319 Bipolar disorder, unspecified: Secondary | ICD-10-CM | POA: Diagnosis not present

## 2016-07-24 DIAGNOSIS — I10 Essential (primary) hypertension: Secondary | ICD-10-CM | POA: Diagnosis not present

## 2016-07-27 DIAGNOSIS — F259 Schizoaffective disorder, unspecified: Secondary | ICD-10-CM | POA: Diagnosis not present

## 2016-07-28 DIAGNOSIS — F259 Schizoaffective disorder, unspecified: Secondary | ICD-10-CM | POA: Diagnosis not present

## 2016-07-29 DIAGNOSIS — F259 Schizoaffective disorder, unspecified: Secondary | ICD-10-CM | POA: Diagnosis not present

## 2016-08-03 DIAGNOSIS — F259 Schizoaffective disorder, unspecified: Secondary | ICD-10-CM | POA: Diagnosis not present

## 2016-08-04 DIAGNOSIS — F259 Schizoaffective disorder, unspecified: Secondary | ICD-10-CM | POA: Diagnosis not present

## 2016-08-05 DIAGNOSIS — F259 Schizoaffective disorder, unspecified: Secondary | ICD-10-CM | POA: Diagnosis not present

## 2016-08-06 DIAGNOSIS — F259 Schizoaffective disorder, unspecified: Secondary | ICD-10-CM | POA: Diagnosis not present

## 2016-08-12 DIAGNOSIS — F259 Schizoaffective disorder, unspecified: Secondary | ICD-10-CM | POA: Diagnosis not present

## 2016-08-13 DIAGNOSIS — F259 Schizoaffective disorder, unspecified: Secondary | ICD-10-CM | POA: Diagnosis not present

## 2016-08-14 DIAGNOSIS — F259 Schizoaffective disorder, unspecified: Secondary | ICD-10-CM | POA: Diagnosis not present

## 2016-08-19 DIAGNOSIS — F259 Schizoaffective disorder, unspecified: Secondary | ICD-10-CM | POA: Diagnosis not present

## 2016-08-20 DIAGNOSIS — F259 Schizoaffective disorder, unspecified: Secondary | ICD-10-CM | POA: Diagnosis not present

## 2016-08-21 DIAGNOSIS — F259 Schizoaffective disorder, unspecified: Secondary | ICD-10-CM | POA: Diagnosis not present

## 2016-08-22 DIAGNOSIS — F339 Major depressive disorder, recurrent, unspecified: Secondary | ICD-10-CM | POA: Diagnosis not present

## 2016-08-22 DIAGNOSIS — F3163 Bipolar disorder, current episode mixed, severe, without psychotic features: Secondary | ICD-10-CM | POA: Diagnosis not present

## 2016-08-22 DIAGNOSIS — F259 Schizoaffective disorder, unspecified: Secondary | ICD-10-CM | POA: Diagnosis not present

## 2016-08-22 DIAGNOSIS — F250 Schizoaffective disorder, bipolar type: Secondary | ICD-10-CM | POA: Diagnosis not present

## 2016-08-22 DIAGNOSIS — G4700 Insomnia, unspecified: Secondary | ICD-10-CM | POA: Diagnosis not present

## 2016-08-26 DIAGNOSIS — F259 Schizoaffective disorder, unspecified: Secondary | ICD-10-CM | POA: Diagnosis not present

## 2016-08-27 DIAGNOSIS — F259 Schizoaffective disorder, unspecified: Secondary | ICD-10-CM | POA: Diagnosis not present

## 2016-08-28 DIAGNOSIS — E038 Other specified hypothyroidism: Secondary | ICD-10-CM | POA: Diagnosis not present

## 2016-08-28 DIAGNOSIS — E559 Vitamin D deficiency, unspecified: Secondary | ICD-10-CM | POA: Diagnosis not present

## 2016-08-28 DIAGNOSIS — E119 Type 2 diabetes mellitus without complications: Secondary | ICD-10-CM | POA: Diagnosis not present

## 2016-08-28 DIAGNOSIS — E782 Mixed hyperlipidemia: Secondary | ICD-10-CM | POA: Diagnosis not present

## 2016-08-28 DIAGNOSIS — Z79899 Other long term (current) drug therapy: Secondary | ICD-10-CM | POA: Diagnosis not present

## 2016-08-28 DIAGNOSIS — D518 Other vitamin B12 deficiency anemias: Secondary | ICD-10-CM | POA: Diagnosis not present

## 2016-08-31 DIAGNOSIS — F259 Schizoaffective disorder, unspecified: Secondary | ICD-10-CM | POA: Diagnosis not present

## 2016-09-01 DIAGNOSIS — F259 Schizoaffective disorder, unspecified: Secondary | ICD-10-CM | POA: Diagnosis not present

## 2016-09-04 DIAGNOSIS — F259 Schizoaffective disorder, unspecified: Secondary | ICD-10-CM | POA: Diagnosis not present

## 2016-09-10 DIAGNOSIS — F259 Schizoaffective disorder, unspecified: Secondary | ICD-10-CM | POA: Diagnosis not present

## 2016-09-11 DIAGNOSIS — F259 Schizoaffective disorder, unspecified: Secondary | ICD-10-CM | POA: Diagnosis not present

## 2016-09-12 DIAGNOSIS — F259 Schizoaffective disorder, unspecified: Secondary | ICD-10-CM | POA: Diagnosis not present

## 2016-09-13 DIAGNOSIS — F259 Schizoaffective disorder, unspecified: Secondary | ICD-10-CM | POA: Diagnosis not present

## 2016-09-20 ENCOUNTER — Encounter (HOSPITAL_COMMUNITY): Payer: Self-pay | Admitting: Emergency Medicine

## 2016-09-20 DIAGNOSIS — I10 Essential (primary) hypertension: Secondary | ICD-10-CM | POA: Insufficient documentation

## 2016-09-20 DIAGNOSIS — Z794 Long term (current) use of insulin: Secondary | ICD-10-CM | POA: Insufficient documentation

## 2016-09-20 DIAGNOSIS — R109 Unspecified abdominal pain: Secondary | ICD-10-CM | POA: Diagnosis not present

## 2016-09-20 DIAGNOSIS — Z79899 Other long term (current) drug therapy: Secondary | ICD-10-CM | POA: Diagnosis not present

## 2016-09-20 DIAGNOSIS — R1031 Right lower quadrant pain: Secondary | ICD-10-CM | POA: Insufficient documentation

## 2016-09-20 DIAGNOSIS — E1165 Type 2 diabetes mellitus with hyperglycemia: Secondary | ICD-10-CM | POA: Diagnosis not present

## 2016-09-20 DIAGNOSIS — R112 Nausea with vomiting, unspecified: Secondary | ICD-10-CM | POA: Insufficient documentation

## 2016-09-20 DIAGNOSIS — R1110 Vomiting, unspecified: Secondary | ICD-10-CM | POA: Diagnosis not present

## 2016-09-20 DIAGNOSIS — R000 Tachycardia, unspecified: Secondary | ICD-10-CM | POA: Diagnosis not present

## 2016-09-20 DIAGNOSIS — R7309 Other abnormal glucose: Secondary | ICD-10-CM | POA: Diagnosis not present

## 2016-09-20 LAB — CBC
HCT: 34.9 % — ABNORMAL LOW (ref 36.0–46.0)
Hemoglobin: 10.6 g/dL — ABNORMAL LOW (ref 12.0–15.0)
MCH: 25.4 pg — AB (ref 26.0–34.0)
MCHC: 30.4 g/dL (ref 30.0–36.0)
MCV: 83.5 fL (ref 78.0–100.0)
PLATELETS: 139 10*3/uL — AB (ref 150–400)
RBC: 4.18 MIL/uL (ref 3.87–5.11)
RDW: 14.7 % (ref 11.5–15.5)
WBC: 4.3 10*3/uL (ref 4.0–10.5)

## 2016-09-20 LAB — URINALYSIS, ROUTINE W REFLEX MICROSCOPIC
BILIRUBIN URINE: NEGATIVE
Glucose, UA: >=500 mg/dL — AB
HGB URINE DIPSTICK: NEGATIVE
Ketones, ur: NEGATIVE mg/dL
NITRITE: NEGATIVE
PH: 6 (ref 5.0–8.0)
Protein, ur: NEGATIVE mg/dL
SPECIFIC GRAVITY, URINE: 1.036 — AB (ref 1.005–1.030)

## 2016-09-20 LAB — BASIC METABOLIC PANEL
ANION GAP: 9 (ref 5–15)
BUN: 8 mg/dL (ref 6–20)
CALCIUM: 8.8 mg/dL — AB (ref 8.9–10.3)
CO2: 22 mmol/L (ref 22–32)
CREATININE: 0.66 mg/dL (ref 0.44–1.00)
Chloride: 101 mmol/L (ref 101–111)
Glucose, Bld: 399 mg/dL — ABNORMAL HIGH (ref 65–99)
Potassium: 3.5 mmol/L (ref 3.5–5.1)
SODIUM: 132 mmol/L — AB (ref 135–145)

## 2016-09-20 LAB — CBG MONITORING, ED: GLUCOSE-CAPILLARY: 409 mg/dL — AB (ref 65–99)

## 2016-09-20 NOTE — ED Triage Notes (Signed)
Per EMS:  Pt presents to ED for assessment after having n/v and general fatigue this evening, and realizing she did not take her insulin.  Pt has hx of non-compliance.  Pt states her sugar was 400+ so she took 28 units of insulin prior to EMS arrival.  500NaCl given en route.  Pt HR 110.

## 2016-09-21 ENCOUNTER — Emergency Department (HOSPITAL_COMMUNITY)
Admission: EM | Admit: 2016-09-21 | Discharge: 2016-09-21 | Disposition: A | Discharge status: Home / Self Care | Payer: Medicare HMO | Attending: Emergency Medicine | Admitting: Emergency Medicine

## 2016-09-21 ENCOUNTER — Emergency Department (HOSPITAL_COMMUNITY): Payer: Medicare HMO

## 2016-09-21 DIAGNOSIS — R739 Hyperglycemia, unspecified: Secondary | ICD-10-CM

## 2016-09-21 DIAGNOSIS — K660 Peritoneal adhesions (postprocedural) (postinfection): Secondary | ICD-10-CM | POA: Diagnosis not present

## 2016-09-21 DIAGNOSIS — R112 Nausea with vomiting, unspecified: Secondary | ICD-10-CM

## 2016-09-21 DIAGNOSIS — R1031 Right lower quadrant pain: Secondary | ICD-10-CM | POA: Diagnosis not present

## 2016-09-21 DIAGNOSIS — E119 Type 2 diabetes mellitus without complications: Secondary | ICD-10-CM | POA: Diagnosis not present

## 2016-09-21 DIAGNOSIS — E785 Hyperlipidemia, unspecified: Secondary | ICD-10-CM | POA: Diagnosis not present

## 2016-09-21 DIAGNOSIS — F259 Schizoaffective disorder, unspecified: Secondary | ICD-10-CM | POA: Diagnosis not present

## 2016-09-21 DIAGNOSIS — I10 Essential (primary) hypertension: Secondary | ICD-10-CM | POA: Diagnosis not present

## 2016-09-21 DIAGNOSIS — R109 Unspecified abdominal pain: Secondary | ICD-10-CM | POA: Diagnosis not present

## 2016-09-21 DIAGNOSIS — R1110 Vomiting, unspecified: Secondary | ICD-10-CM | POA: Diagnosis not present

## 2016-09-21 LAB — CBG MONITORING, ED
GLUCOSE-CAPILLARY: 378 mg/dL — AB (ref 65–99)
GLUCOSE-CAPILLARY: 334 mg/dL — AB (ref 65–99)
GLUCOSE-CAPILLARY: 333 mg/dL — AB (ref 65–99)
Glucose-Capillary: 360 mg/dL — ABNORMAL HIGH (ref 65–99)

## 2016-09-21 LAB — PREGNANCY, URINE: PREG TEST UR: NEGATIVE

## 2016-09-21 MED ORDER — SODIUM CHLORIDE 0.9 % IV BOLUS (SEPSIS)
1000.0000 mL | Freq: Once | INTRAVENOUS | Status: AC
  Administered 2016-09-21: 1000 mL via INTRAVENOUS

## 2016-09-21 MED ORDER — SODIUM CHLORIDE 0.9 % IV BOLUS (SEPSIS): 500.0000 mL | Freq: Once | INTRAVENOUS | Status: DC

## 2016-09-21 MED ORDER — FENTANYL CITRATE (PF) 100 MCG/2ML IJ SOLN
50.0000 ug | Freq: Once | INTRAMUSCULAR | Status: AC
  Administered 2016-09-21: 50 ug via INTRAVENOUS
  Filled 2016-09-21: qty 2

## 2016-09-21 MED ORDER — INSULIN ASPART 100 UNIT/ML ~~LOC~~ SOLN
5.0000 [IU] | Freq: Once | SUBCUTANEOUS | Status: AC
Start: 2016-09-21 — End: 2016-09-21
  Administered 2016-09-21: 5 [IU] via SUBCUTANEOUS
  Filled 2016-09-21: qty 1

## 2016-09-21 MED ORDER — IOPAMIDOL (ISOVUE-300) INJECTION 61%
INTRAVENOUS | Status: AC
  Administered 2016-09-21: 100 mL
  Filled 2016-09-21: qty 100

## 2016-09-21 MED ORDER — INSULIN ASPART 100 UNIT/ML ~~LOC~~ SOLN
5.0000 [IU] | Freq: Once | SUBCUTANEOUS | Status: AC
  Administered 2016-09-21: 5 [IU] via SUBCUTANEOUS
  Filled 2016-09-21: qty 1

## 2016-09-21 MED ORDER — ONDANSETRON HCL 4 MG/2ML IJ SOLN
4.0000 mg | Freq: Once | INTRAMUSCULAR | Status: AC
  Administered 2016-09-21: 4 mg via INTRAVENOUS
  Filled 2016-09-21: qty 2

## 2016-09-21 MED ORDER — INSULIN DETEMIR 100 UNIT/ML ~~LOC~~ SOLN
57.0000 [IU] | Freq: Once | SUBCUTANEOUS | Status: AC
  Administered 2016-09-21: 57 [IU] via SUBCUTANEOUS
  Filled 2016-09-21: qty 0.57

## 2016-09-21 MED ORDER — SODIUM CHLORIDE 0.9 % IV SOLN
1000.0000 mL | INTRAVENOUS | Status: DC
  Administered 2016-09-21: 1000 mL via INTRAVENOUS

## 2016-09-21 NOTE — ED Provider Notes (Signed)
[place] DEPT Provider Note   CSN: [id number] Arrival date & time: 09/20/16  2045     History   Chief Complaint Chief Complaint  Patient presents with  . Hyperglycemia    HPI Tina Saunders is a 31 y.o. female with a hx of Anxiety, obesity, bipolar disorder, insulin independent diabetes, hypertension, PCOS, schizophrenia presents to the Emergency Department complaining of gradual, persistent, progressively worsening right lower quadrant abdominal pain with persistent nausea and vomiting throughout the last 24 hours. Patient reports the emesis has been nonbloody and nonbilious. She does not know him a time she has vomited. She reports that nothing makes her symptoms better or worse. She reports that her blood sugar at home was very high. She states she did not take her Levemir tonight but did take 20 units of NovoLog prior to calling 911. Patient denies recent travel or sick contacts. No fevers or chills, chest pain, shortness of breath, difficulty walking.   The history is provided by the patient and medical records. No language interpreter was used.    Past Medical History:  Diagnosis Date  . Anxiety   . Bipolar 1 disorder ([place])   . Cancer of abdominal wall   . Depression   . Diabetes mellitus without complication ([place])   . Hypertension   . Obesity   . Obesity   . Polycystic ovarian syndrome 07/01/2011   Patient report  . 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 Schizophrenia [facility])     Patient Active Problem List   Diagnosis Date Noted  . Schizoaffective disorder, bipolar type ([place]) 05/30/2014  . Suicidal ideation   . 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 Headache 04/15/2013  . HTN (hypertension) 04/15/2013  . Post traumatic stress disorder 12/07/2011  . CAP (community acquired pneumonia) 08/27/2011  . Chest pain 08/26/2011  . SOB (shortness of breath) 08/26/2011  . Fever 08/26/2011  . Hypokalemia 08/26/2011  . PSVT (paroxysmal supraventricular tachycardia) ([place]) 08/26/2011  .  ADHD 09/23/2007  . EPIGASTRIC PAIN 09/23/2007  . Obesity, unspecified 07/30/2007  . DEPRESSION 07/30/2007  . SLEEP DISORDER 07/30/2007  . IMPAIRED FASTING GLUCOSE 07/30/2007  . FATIGUE 11/21/2006  . ABNORMAL FINDINGS, ELEVATED BP W/O HTN 11/21/2006  . METRORRHAGIA 06/13/2006  . DISORDER, MENSTRUAL NEC 06/13/2006  . DIZZINESS 06/13/2006  . POLYCYSTIC OVARIAN DISEASE 04/25/2006  . AMENORRHEA, SECONDARY 04/20/2006  . ACNE, MILD 04/20/2006  . ABDOMINAL PAIN 04/20/2006    Past Surgical History:  Procedure Laterality Date  . CHOLECYSTECTOMY    . HERNIA REPAIR    . Ovarian Cyst Excision    . VARICOSE VEIN SURGERY      OB History    Gravida Para Term Preterm AB Living   0             SAB TAB Ectopic Multiple Live Births                   Home Medications    Prior to Admission medications   Medication Sig Start Date End Date Taking? Authorizing Provider  albuterol (PROVENTIL HFA;VENTOLIN HFA) 108 (90 Base) MCG/ACT inhaler Inhale 1 puff into the lungs every 4 (four) hours as needed for wheezing or shortness of breath.    [provider]  busPIRone (BUSPAR) 15 MG tablet Take 15 mg by mouth 3 (three) times daily.    [provider]  chlorproMAZINE (THORAZINE) 25 MG tablet Take 50 mg by mouth 3 (three) times daily.     [provider]  cloNIDine (  CATAPRES) 0.1 MG tablet Take 0.1 mg by mouth 2 (two) times daily.    [provider]  cyclobenzaprine (FLEXERIL) 5 MG tablet Take 5 mg by mouth 2 (two) times daily.    [provider]  dicyclomine (BENTYL) 20 MG tablet Take 20 mg by mouth every 8 (eight) hours as needed for spasms.    [provider]  docusate sodium (COLACE) 100 MG capsule Take 100 mg by mouth 2 (two) times daily.    [provider]  fenofibrate (TRICOR) 145 MG tablet Take 145 mg by mouth daily.    [provider]  gabapentin (NEURONTIN) 400 MG capsule Take 1 capsule (400 mg total) by mouth 3 (three)  times daily. 04/05/16   Patrecia Pour, NP  gabapentin (NEURONTIN) 800 MG tablet Take 800 mg by mouth 3 (three) times daily.    [provider]  ibuprofen (ADVIL,MOTRIN) 800 MG tablet Take 1 tablet (800 mg total) by mouth every 8 (eight) hours as needed for moderate pain. 01/08/15   Domenic Moras, PA-C  insulin aspart (NOVOLOG FLEXPEN) 100 UNIT/ML FlexPen Inject 22 Units into the skin 3 (three) times daily with meals.    [provider]  insulin detemir (LEVEMIR) 100 UNIT/ML injection Inject 57 Units into the skin 2 (two) times daily.     [provider]  lactulose (CHRONULAC) 10 GM/15ML solution Take 20 g by mouth 2 (two) times daily.    [provider]  lisinopril (PRINIVIL,ZESTRIL) 10 MG tablet Take 10 mg by mouth daily.    [provider]  LORazepam (ATIVAN) 0.5 MG tablet Take 0.5 mg by mouth daily at 12 noon.    [provider]  meloxicam (MOBIC) 7.5 MG tablet Take 7.5 mg by mouth 2 (two) times daily.    [provider]  norgestimate-ethinyl estradiol (ORTHO-CYCLEN,SPRINTEC,PREVIFEM) 0.25-35 MG-MCG tablet Take 1 tablet by mouth daily. 11/24/15   Truett Mainland, DO  pantoprazole (PROTONIX) 40 MG tablet Take 40 mg by mouth at bedtime.    [provider]  polyethylene glycol (MIRALAX / GLYCOLAX) packet Take 17 g by mouth daily.    [provider]  QUEtiapine (SEROQUEL) 300 MG tablet Take 1 tablet (300 mg total) by mouth 2 (two) times daily. 04/05/16   Patrecia Pour, NP  QUEtiapine (SEROQUEL) 400 MG tablet Take 800 mg by mouth at bedtime.    [provider]  sertraline (ZOLOFT) 100 MG tablet Take 200 mg by mouth every morning.     [provider]  simvastatin (ZOCOR) 10 MG tablet Take 10 mg by mouth at bedtime.    [provider]  topiramate (TOPAMAX) 100 MG tablet Take 1 tablet (100 mg total) by mouth 2 (two) times daily. 04/05/16   Patrecia Pour, NP  topiramate (TOPAMAX) 25 MG tablet Take  25 mg by mouth 2 (two) times daily.    [provider]  traMADol (ULTRAM) 50 MG tablet Take 25 mg by mouth every 12 (twelve) hours as needed for moderate pain.    [provider]  traZODone (DESYREL) 50 MG tablet Take 25 mg by mouth See admin instructions. Takes 1/2 tab at 0800, 1200, then takes 2 tabs at bedtime 2000    [provider]  Vitamin D, Ergocalciferol, (DRISDOL) 50000 units CAPS capsule Take 50,000 Units by mouth every Thursday.     [provider]    Family History Family History  Problem Relation Age of Onset  . Coronary artery disease  Maternal Grandmother   . Diabetes type II Maternal Grandmother   . Cancer Maternal Grandmother   . Hypertension Mother   . Hypertension Father     Social History Social History  Substance Use Topics  . Smoking status: Never Smoker  . Smokeless tobacco: Never Used  . Alcohol use No     Allergies   Fish-derived products; Geodon [ziprasidone hcl]; Haldol [haloperidol lactate]; Buprenorphine hcl; Compazine [prochlorperazine]; Morphine and related; and Toradol [ketorolac tromethamine]   Review of Systems Review of Systems  Constitutional: Negative for appetite change, diaphoresis, fatigue, fever and unexpected weight change.  HENT: Negative for mouth sores.   Eyes: Negative for visual disturbance.  Respiratory: Negative for cough, chest tightness, shortness of breath and wheezing.   Cardiovascular: Negative for chest pain.  Gastrointestinal: Positive for abdominal pain, nausea and vomiting. Negative for constipation and diarrhea.  Endocrine: Negative for polydipsia, polyphagia and polyuria.  Genitourinary: Negative for dysuria, frequency, hematuria and urgency.  Musculoskeletal: Negative for back pain and neck stiffness.  Skin: Negative for rash.  Allergic/Immunologic: Negative for immunocompromised state.  Neurological: Negative for syncope, light-headedness and headaches.  Hematological: Does not  bruise/bleed easily.  Psychiatric/Behavioral: Negative for sleep disturbance. The patient is not nervous/anxious.      Physical Exam Updated Vital Signs BP 138/82   Pulse 100   Temp 98.6 F (37 C) (Oral)   Resp 18   LMP 08/09/2016   SpO2 97%   Physical Exam  Constitutional: She appears well-developed and well-nourished. No distress.  Awake, alert, nontoxic appearance  HENT:  Head: Normocephalic and atraumatic.  Mouth/Throat: Oropharynx is clear and moist. No oropharyngeal exudate.  Eyes: Conjunctivae are normal. No scleral icterus.  Neck: Normal range of motion. Neck supple.  Cardiovascular: Regular rhythm and intact distal pulses.  Tachycardia present.   Pulses:      Radial pulses are 2+ on the right side, and 2+ on the left side.  Pulmonary/Chest: Effort normal and breath sounds normal. No respiratory distress. She has no wheezes.  Equal chest expansion  Abdominal: Soft. Bowel sounds are normal. She exhibits no mass. There is tenderness in the right lower quadrant. There is no rigidity, no rebound, no guarding, no CVA tenderness, no tenderness at McBurney's point and negative Murphy's sign.  Exam limited by patient body habitus and morbid obesity  Musculoskeletal: Normal range of motion. She exhibits no edema.  Neurological: She is alert.  Speech is clear and goal oriented Moves extremities without ataxia  Skin: Skin is warm and dry. She is not diaphoretic.  Psychiatric: She has a normal mood and affect.  Nursing note and vitals reviewed.    ED Treatments / Results  Labs (all labs ordered are listed, but only abnormal results are displayed) Labs Reviewed  BASIC METABOLIC PANEL - Abnormal; Notable for the following:       Result Value   Sodium 132 (*)    Glucose, Bld 399 (*)    Calcium 8.8 (*)    All other components within normal limits  CBC - Abnormal; Notable for the following:    Hemoglobin 10.6 (*)    HCT 34.9 (*)    MCH 25.4 (*)    Platelets 139 (*)     All other components within normal limits  URINALYSIS, ROUTINE W REFLEX MICROSCOPIC - Abnormal; Notable for the following:    APPearance HAZY (*)    Specific Gravity, Urine 1.036 (*)    Glucose, UA >=500 (*)    Leukocytes, UA SMALL (*)  Bacteria, UA RARE (*)    Squamous Epithelial / LPF 0-5 (*)    All other components within normal limits  CBG MONITORING, ED - Abnormal; Notable for the following:    Glucose-Capillary 409 (*)    All other components within normal limits  CBG MONITORING, ED - Abnormal; Notable for the following:    Glucose-Capillary 378 (*)    All other components within normal limits  CBG MONITORING, ED - Abnormal; Notable for the following:    Glucose-Capillary 360 (*)    All other components within normal limits  CBG MONITORING, ED - Abnormal; Notable for the following:    Glucose-Capillary 334 (*)    All other components within normal limits  PREGNANCY, URINE     Radiology Ct Abdomen Pelvis W Contrast  Result Date: 09/21/2016 CLINICAL DATA:  Abdominal pain, nausea, distention and vomiting. EXAM: CT ABDOMEN AND PELVIS WITH CONTRAST TECHNIQUE: Multidetector CT imaging of the abdomen and pelvis was performed using the standard protocol following bolus administration of intravenous contrast. CONTRAST:  172mL ISOVUE-300 IOPAMIDOL (ISOVUE-300) INJECTION 61% COMPARISON:  06/02/2016 FINDINGS: Lower chest: Minimal patchy left lung base opacity is unchanged. No confluent consolidation. No effusion. Hepatobiliary: Enlarged liver with marked fatty infiltration. No suspicious liver lesion. Cholecystectomy. Normal bile ducts. Pancreas: Unremarkable. No pancreatic ductal dilatation or surrounding inflammatory changes. Spleen: Moderate splenomegaly without focal lesion, unchanged. Adrenals/Urinary Tract: Adrenal glands are unremarkable. Kidneys are normal, without renal calculi, focal lesion, or hydronephrosis. Bladder is unremarkable. Stomach/Bowel: Stomach is within normal limits.  Appendix is normal. No evidence of bowel wall thickening, distention, or inflammatory changes. Vascular/Lymphatic: No significant vascular findings are present. No enlarged abdominal or pelvic lymph nodes. Reproductive: Uterus and bilateral adnexa are unremarkable. Other: No acute inflammation.  No ascites. Musculoskeletal: No significant skeletal lesion. IMPRESSION: 1. Hepatosplenomegaly. Diffuse fatty infiltration of the liver. This is unchanged from 06/02/2016. 2. No bowel obstruction or inflammation. 3. No inflammatory changes in the abdomen or pelvis. No ascites or adenopathy. Electronically Signed   By: Andreas Newport M.D.   On: 09/21/2016 03:02    Procedures Procedures (including critical care time)  Medications Ordered in ED Medications  sodium chloride 0.9 % bolus 1,000 mL (0 mLs Intravenous Stopped 09/21/16 0421)    Followed by  sodium chloride 0.9 % bolus 1,000 mL (0 mLs Intravenous Stopped 09/21/16 0142)    Followed by  0.9 %  sodium chloride infusion (0 mLs Intravenous Stopped 09/21/16 0346)  fentaNYL (SUBLIMAZE) injection 50 mcg (50 mcg Intravenous Given 09/21/16 0129)  ondansetron (ZOFRAN) injection 4 mg (4 mg Intravenous Given 09/21/16 0130)  iopamidol (ISOVUE-300) 61 % injection (100 mLs  Contrast Given 09/21/16 0242)  fentaNYL (SUBLIMAZE) injection 50 mcg (50 mcg Intravenous Given 09/21/16 0302)  fentaNYL (SUBLIMAZE) injection 50 mcg (50 mcg Intravenous Given 09/21/16 0352)  sodium chloride 0.9 % bolus 1,000 mL (0 mLs Intravenous Stopped 09/21/16 0603)  insulin aspart (novoLOG) injection 5 Units (5 Units Subcutaneous Given 09/21/16 0351)  insulin aspart (novoLOG) injection 5 Units (5 Units Subcutaneous Given 09/21/16 0453)  insulin detemir (LEVEMIR) injection 57 Units (57 Units Subcutaneous Given 09/21/16 0704)     Initial Impression / Assessment and Plan / ED Course  I have reviewed the triage vital signs and the nursing notes.  Pertinent labs & imaging results that were  available during my care of the patient were reviewed by me and considered in my medical decision making (see chart for details).  Clinical Course as of Sep 21 708  Thu  Sep 21, 2016  0611 Patient reports her blood sugar normally runs in the lower 200s. She reports taking her NovoLog last night but not her Levemir.  [HM]    Clinical Course User Index [HM] Franki Stemen, Gwenlyn Perking    Patient presents with elevated glucose and right lower quadrant abdominal pain. She reports persistent vomiting however after numerous hours in the emergency department she has not vomited at all. Patient given fluids and NovoLog without significant decrease in her glucose. CT scan without acute abnormality including no evidence of appendicitis. Urinalysis without evidence of urinary tract infection.  Urine without ketones and no anion gap. No evidence of DKA.  I have ordered Levemir for the patient.  At shift change care was transferred to Doreen Salvage, PA-C who will follow blood sugars, re-evaulate and determine disposition.  Plan for d/c home if CBG normalizes.   Final Clinical Impressions(s) / ED Diagnoses   Final diagnoses:  Hyperglycemia  Right lower quadrant abdominal pain    New Prescriptions New Prescriptions   No medications on file     Agapito Games 09/21/16 0710    Everlene Balls, MD 09/21/16 1425

## 2016-09-21 NOTE — Discharge Instructions (Signed)

## 2016-09-21 NOTE — ED Notes (Signed)
CBG 333. °

## 2016-09-21 NOTE — ED Notes (Signed)
Pt updated on delays and wait time

## 2016-09-21 NOTE — ED Provider Notes (Signed)
Patient with IDDM, poorly controlled 1 day aof vomiting. Did not take her basal insulin.  FLuids and IV insulin have only decreased her sugars slihtly .  Giving levemir and recheck. Patient feeling improved.  Will dc with close pcp follow up   Margarita Mail, PA-C 09/21/16 [place], [place], MD 09/22/16 [phone]

## 2016-09-21 NOTE — ED Notes (Signed)
Patient updated on delays and rooming

## 2016-09-22 DIAGNOSIS — I10 Essential (primary) hypertension: Secondary | ICD-10-CM | POA: Diagnosis not present

## 2016-09-22 DIAGNOSIS — E785 Hyperlipidemia, unspecified: Secondary | ICD-10-CM | POA: Diagnosis not present

## 2016-09-22 DIAGNOSIS — F259 Schizoaffective disorder, unspecified: Secondary | ICD-10-CM | POA: Diagnosis not present

## 2016-09-22 DIAGNOSIS — E119 Type 2 diabetes mellitus without complications: Secondary | ICD-10-CM | POA: Diagnosis not present

## 2016-09-26 DIAGNOSIS — I10 Essential (primary) hypertension: Secondary | ICD-10-CM | POA: Diagnosis not present

## 2016-09-26 DIAGNOSIS — E785 Hyperlipidemia, unspecified: Secondary | ICD-10-CM | POA: Diagnosis not present

## 2016-09-28 DIAGNOSIS — B373 Candidiasis of vulva and vagina: Secondary | ICD-10-CM | POA: Diagnosis not present

## 2016-09-28 DIAGNOSIS — E119 Type 2 diabetes mellitus without complications: Secondary | ICD-10-CM | POA: Diagnosis not present

## 2016-09-30 DIAGNOSIS — E1165 Type 2 diabetes mellitus with hyperglycemia: Secondary | ICD-10-CM | POA: Diagnosis not present

## 2016-09-30 DIAGNOSIS — B373 Candidiasis of vulva and vagina: Secondary | ICD-10-CM | POA: Diagnosis not present

## 2016-09-30 DIAGNOSIS — F319 Bipolar disorder, unspecified: Secondary | ICD-10-CM | POA: Diagnosis not present

## 2016-09-30 DIAGNOSIS — F250 Schizoaffective disorder, bipolar type: Secondary | ICD-10-CM | POA: Diagnosis not present

## 2016-09-30 DIAGNOSIS — M797 Fibromyalgia: Secondary | ICD-10-CM | POA: Diagnosis not present

## 2016-09-30 DIAGNOSIS — I10 Essential (primary) hypertension: Secondary | ICD-10-CM | POA: Diagnosis not present

## 2016-10-05 ENCOUNTER — Emergency Department (HOSPITAL_COMMUNITY)
Admission: EM | Admit: 2016-10-05 | Discharge: 2016-10-06 | Disposition: A | Discharge status: Home / Self Care | Payer: Medicare HMO | Attending: Emergency Medicine | Admitting: Emergency Medicine

## 2016-10-05 ENCOUNTER — Encounter (HOSPITAL_COMMUNITY): Payer: Self-pay

## 2016-10-05 DIAGNOSIS — E860 Dehydration: Secondary | ICD-10-CM | POA: Diagnosis not present

## 2016-10-05 DIAGNOSIS — R1010 Upper abdominal pain, unspecified: Secondary | ICD-10-CM | POA: Diagnosis not present

## 2016-10-05 DIAGNOSIS — Z885 Allergy status to narcotic agent status: Secondary | ICD-10-CM | POA: Diagnosis not present

## 2016-10-05 DIAGNOSIS — R739 Hyperglycemia, unspecified: Secondary | ICD-10-CM

## 2016-10-05 DIAGNOSIS — B373 Candidiasis of vulva and vagina: Secondary | ICD-10-CM | POA: Diagnosis not present

## 2016-10-05 DIAGNOSIS — Z794 Long term (current) use of insulin: Secondary | ICD-10-CM | POA: Insufficient documentation

## 2016-10-05 DIAGNOSIS — C494 Malignant neoplasm of connective and soft tissue of abdomen: Secondary | ICD-10-CM | POA: Insufficient documentation

## 2016-10-05 DIAGNOSIS — R112 Nausea with vomiting, unspecified: Secondary | ICD-10-CM | POA: Insufficient documentation

## 2016-10-05 DIAGNOSIS — Z79899 Other long term (current) drug therapy: Secondary | ICD-10-CM | POA: Diagnosis not present

## 2016-10-05 DIAGNOSIS — E1165 Type 2 diabetes mellitus with hyperglycemia: Secondary | ICD-10-CM | POA: Diagnosis not present

## 2016-10-05 DIAGNOSIS — I10 Essential (primary) hypertension: Secondary | ICD-10-CM | POA: Insufficient documentation

## 2016-10-05 DIAGNOSIS — F250 Schizoaffective disorder, bipolar type: Secondary | ICD-10-CM | POA: Diagnosis not present

## 2016-10-05 DIAGNOSIS — M797 Fibromyalgia: Secondary | ICD-10-CM | POA: Diagnosis not present

## 2016-10-05 DIAGNOSIS — F319 Bipolar disorder, unspecified: Secondary | ICD-10-CM | POA: Diagnosis not present

## 2016-10-05 LAB — CBG MONITORING, ED: Glucose-Capillary: 321 mg/dL — ABNORMAL HIGH (ref 65–99)

## 2016-10-05 LAB — URINALYSIS, ROUTINE W REFLEX MICROSCOPIC
BILIRUBIN URINE: NEGATIVE
Hgb urine dipstick: NEGATIVE
KETONES UR: NEGATIVE mg/dL
Nitrite: NEGATIVE
PH: 5 (ref 5.0–8.0)
PROTEIN: NEGATIVE mg/dL
Specific Gravity, Urine: 1.029 (ref 1.005–1.030)

## 2016-10-05 LAB — CBC
HEMATOCRIT: 36.5 % (ref 36.0–46.0)
Hemoglobin: 11.2 g/dL — ABNORMAL LOW (ref 12.0–15.0)
MCH: 26.1 pg (ref 26.0–34.0)
MCHC: 30.7 g/dL (ref 30.0–36.0)
MCV: 85.1 fL (ref 78.0–100.0)
PLATELETS: 113 10*3/uL — AB (ref 150–400)
RBC: 4.29 MIL/uL (ref 3.87–5.11)
RDW: 15 % (ref 11.5–15.5)
WBC: 3.8 10*3/uL — AB (ref 4.0–10.5)

## 2016-10-05 LAB — BASIC METABOLIC PANEL
Anion gap: 10 (ref 5–15)
BUN: 9 mg/dL (ref 6–20)
CO2: 19 mmol/L — ABNORMAL LOW (ref 22–32)
Calcium: 9 mg/dL (ref 8.9–10.3)
Chloride: 101 mmol/L (ref 101–111)
Creatinine, Ser: 0.67 mg/dL (ref 0.44–1.00)
GFR calc Af Amer: >60 mL/min (ref >60)
GFR calc non Af Amer: >60 mL/min (ref >60)
Glucose, Bld: 540 mg/dL (ref 65–99)
Potassium: 4.4 mmol/L (ref 3.5–5.1)
Sodium: 130 mmol/L — ABNORMAL LOW (ref 135–145)

## 2016-10-05 LAB — I-STAT BETA HCG BLOOD, ED (MC, WL, AP ONLY): I-stat hCG, quantitative: <5 m[IU]/mL (ref <5)

## 2016-10-05 MED ORDER — SODIUM CHLORIDE 0.9 % IV BOLUS (SEPSIS)
1000.0000 mL | Freq: Once | INTRAVENOUS | Status: AC
  Administered 2016-10-05: 1000 mL via INTRAVENOUS

## 2016-10-05 MED ORDER — FENTANYL CITRATE (PF) 100 MCG/2ML IJ SOLN
50.0000 ug | Freq: Once | INTRAMUSCULAR | Status: AC
  Administered 2016-10-06: 50 ug via INTRAVENOUS
  Filled 2016-10-05: qty 2

## 2016-10-05 MED ORDER — GI COCKTAIL ~~LOC~~
30.0000 mL | Freq: Once | ORAL | Status: AC
  Administered 2016-10-05: 30 mL via ORAL
  Filled 2016-10-05: qty 30

## 2016-10-05 MED ORDER — ONDANSETRON HCL 4 MG/2ML IJ SOLN
4.0000 mg | Freq: Once | INTRAMUSCULAR | Status: AC
  Administered 2016-10-05: 4 mg via INTRAVENOUS
  Filled 2016-10-05: qty 2

## 2016-10-05 NOTE — ED Notes (Signed)
Attempted to gain IV access x2, without success. Seeking assistance or IV Team consult.

## 2016-10-05 NOTE — ED Provider Notes (Signed)
[facility] DEPT Provider Note   CSN: [id number] Arrival date & time: 10/05/16  1741     History   Chief Complaint Chief Complaint  Patient presents with  . Hyperglycemia    HPI Tina Saunders is a 31 y.o. female.  HPI Patient presents with 2 days history of nausea and upper abdominal pain. States she's had some vomiting. States her sugars have gone up to 500s. Occasional diarrhea. No sick contacts. Pain is dull and in her upper abdomen. No fevers. Has had her gallbladder out the past. Does not drink alcohol. Pain is dull and constant worse after eating.   Past Medical History:  Diagnosis Date  . Anxiety   . Bipolar 1 disorder ([place])   . Cancer of abdominal wall   . Depression   . Diabetes mellitus without complication ([place])   . Hypertension   . Obesity   . Obesity   . Polycystic ovarian syndrome 07/01/2011   Patient report  . 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 Schizophrenia [facility])     Patient Active Problem List   Diagnosis Date Noted  . Schizoaffective disorder, bipolar type ([place]) 05/30/2014  . Suicidal ideation   . 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 Headache 04/15/2013  . HTN (hypertension) 04/15/2013  . Post traumatic stress disorder 12/07/2011  . CAP (community acquired pneumonia) 08/27/2011  . Chest pain 08/26/2011  . SOB (shortness of breath) 08/26/2011  . Fever 08/26/2011  . Hypokalemia 08/26/2011  . PSVT (paroxysmal supraventricular tachycardia) ([place]) 08/26/2011  . ADHD 09/23/2007  . EPIGASTRIC PAIN 09/23/2007  . Obesity, unspecified 07/30/2007  . DEPRESSION 07/30/2007  . SLEEP DISORDER 07/30/2007  . IMPAIRED FASTING GLUCOSE 07/30/2007  . FATIGUE 11/21/2006  . ABNORMAL FINDINGS, ELEVATED BP W/O HTN 11/21/2006  . METRORRHAGIA 06/13/2006  . DISORDER, MENSTRUAL NEC 06/13/2006  . DIZZINESS 06/13/2006  . POLYCYSTIC OVARIAN DISEASE 04/25/2006  . AMENORRHEA, SECONDARY 04/20/2006  . ACNE, MILD 04/20/2006  . ABDOMINAL PAIN 04/20/2006    Past Surgical  History:  Procedure Laterality Date  . CHOLECYSTECTOMY    . HERNIA REPAIR    . Ovarian Cyst Excision    . VARICOSE VEIN SURGERY      OB History    Gravida Para Term Preterm AB Living   0             SAB TAB Ectopic Multiple Live Births                   Home Medications    Prior to Admission medications   Medication Sig Start Date End Date Taking? Authorizing Provider  albuterol (PROVENTIL HFA;VENTOLIN HFA) 108 (90 Base) MCG/ACT inhaler Inhale 1 puff into the lungs every 4 (four) hours as needed for wheezing or shortness of breath.    [provider]  busPIRone (BUSPAR) 15 MG tablet Take 15 mg by mouth 3 (three) times daily.    [provider]  chlorproMAZINE (THORAZINE) 25 MG tablet Take 50 mg by mouth 3 (three) times daily.     [provider]  cloNIDine (CATAPRES) 0.1 MG tablet Take 0.1 mg by mouth 2 (two) times daily.    [provider]  cyclobenzaprine (FLEXERIL) 5 MG tablet Take 5 mg by mouth 2 (two) times daily.    [provider]  dicyclomine (BENTYL) 20 MG tablet Take 20 mg by mouth every 8 (eight) hours as needed for spasms.    [provider]  docusate sodium (COLACE) 100 MG capsule Take 100 mg  by mouth 2 (two) times daily.    [provider]  fenofibrate (TRICOR) 145 MG tablet Take 145 mg by mouth daily.    [provider]  gabapentin (NEURONTIN) 400 MG capsule Take 1 capsule (400 mg total) by mouth 3 (three) times daily. 04/05/16   Patrecia Pour, NP  gabapentin (NEURONTIN) 800 MG tablet Take 800 mg by mouth 3 (three) times daily.    [provider]  ibuprofen (ADVIL,MOTRIN) 800 MG tablet Take 1 tablet (800 mg total) by mouth every 8 (eight) hours as needed for moderate pain. 01/08/15   Domenic Moras, PA-C  insulin aspart (NOVOLOG FLEXPEN) 100 UNIT/ML FlexPen Inject 22 Units into the skin 3 (three) times daily with meals.    [provider]  insulin detemir (LEVEMIR) 100 UNIT/ML  injection Inject 57 Units into the skin 2 (two) times daily.     [provider]  lactulose (CHRONULAC) 10 GM/15ML solution Take 20 g by mouth 2 (two) times daily.    [provider]  lisinopril (PRINIVIL,ZESTRIL) 10 MG tablet Take 10 mg by mouth daily.    [provider]  LORazepam (ATIVAN) 0.5 MG tablet Take 0.5 mg by mouth daily at 12 noon.    [provider]  meloxicam (MOBIC) 7.5 MG tablet Take 7.5 mg by mouth 2 (two) times daily.    [provider]  norgestimate-ethinyl estradiol (ORTHO-CYCLEN,SPRINTEC,PREVIFEM) 0.25-35 MG-MCG tablet Take 1 tablet by mouth daily. 11/24/15   Truett Mainland, DO  ondansetron (ZOFRAN-ODT) 4 MG disintegrating tablet Take 1 tablet (4 mg total) by mouth every 8 (eight) hours as needed for nausea or vomiting. 10/06/16   Davonna Belling, MD  pantoprazole (PROTONIX) 40 MG tablet Take 40 mg by mouth at bedtime.    [provider]  polyethylene glycol (MIRALAX / GLYCOLAX) packet Take 17 g by mouth daily.    [provider]  QUEtiapine (SEROQUEL) 300 MG tablet Take 1 tablet (300 mg total) by mouth 2 (two) times daily. 04/05/16   Patrecia Pour, NP  QUEtiapine (SEROQUEL) 400 MG tablet Take 800 mg by mouth at bedtime.    [provider]  sertraline (ZOLOFT) 100 MG tablet Take 200 mg by mouth every morning.     [provider]  simvastatin (ZOCOR) 10 MG tablet Take 10 mg by mouth at bedtime.    [provider]  topiramate (TOPAMAX) 100 MG tablet Take 1 tablet (100 mg total) by mouth 2 (two) times daily. 04/05/16   Patrecia Pour, NP  topiramate (TOPAMAX) 25 MG tablet Take 25 mg by mouth 2 (two) times daily.    [provider]  traMADol (ULTRAM) 50 MG tablet Take 25 mg by mouth every 12 (twelve) hours as needed for moderate pain.    [provider]  traZODone (DESYREL) 50 MG tablet Take 25 mg by mouth See admin instructions. Takes 1/2 tab at 0800, 1200, then takes 2  tabs at bedtime 2000    [provider]  Vitamin D, Ergocalciferol, (DRISDOL) 50000 units CAPS capsule Take 50,000 Units by mouth every Thursday.     [provider]    Family History Family History  Problem Relation Age of Onset  . Coronary artery disease Maternal Grandmother   . Diabetes type II Maternal Grandmother   . Cancer Maternal Grandmother   . Hypertension Mother   . Hypertension Father     Social History Social History  Substance Use Topics  . Smoking status: Never Smoker  .  Smokeless tobacco: Never Used  . Alcohol use No     Allergies   Fish-derived products; Geodon [ziprasidone hcl]; Haldol [haloperidol lactate]; Buprenorphine hcl; Compazine [prochlorperazine]; Morphine and related; and Toradol [ketorolac tromethamine]   Review of Systems Review of Systems  Constitutional: Positive for appetite change. Negative for fever.  HENT: Negative for congestion.   Respiratory: Negative for cough.   Gastrointestinal: Positive for abdominal pain, nausea and vomiting. Negative for constipation.  Genitourinary: Negative for flank pain.  Musculoskeletal: Negative for back pain.  Skin: Negative for rash.  Neurological: Negative for syncope.  Hematological: Negative for adenopathy.  Psychiatric/Behavioral: Negative for confusion.     Physical Exam Updated Vital Signs BP 115/70   Pulse (!) 112   Temp 99 F (37.2 C) (Oral)   Resp 20   Ht 5\' 4"  (1.626 m)   Wt 112 kg (247 lb)   LMP 09/06/2016 (Approximate)   SpO2 96%   BMI 42.40 kg/m   Physical Exam  Constitutional: She appears well-developed.  Patient is obese  HENT:  Head: Normocephalic.  Eyes: EOM are normal.  Neck: Neck supple.  Cardiovascular:  Mild tachycardia  Pulmonary/Chest: Effort normal.  Abdominal: Soft.  Upper abdominal to left upper quadrant tenderness. No rebound or guarding. No hernias palpated.  Musculoskeletal: She exhibits no tenderness.  Neurological: She is alert.    Skin: Skin is warm. Capillary refill takes less than 2 seconds.  Psychiatric: She has a normal mood and affect.     ED Treatments / Results  Labs (all labs ordered are listed, but only abnormal results are displayed) Labs Reviewed  BASIC METABOLIC PANEL - Abnormal; Notable for the following:       Result Value   Sodium 130 (*)    CO2 19 (*)    Glucose, Bld 540 (*)    All other components within normal limits  CBC - Abnormal; Notable for the following:    WBC 3.8 (*)    Hemoglobin 11.2 (*)    Platelets 113 (*)    All other components within normal limits  URINALYSIS, ROUTINE W REFLEX MICROSCOPIC - Abnormal; Notable for the following:    Glucose, UA >=500 (*)    Leukocytes, UA TRACE (*)    Bacteria, UA RARE (*)    Squamous Epithelial / LPF 0-5 (*)    All other components within normal limits  CBG MONITORING, ED - Abnormal; Notable for the following:    Glucose-Capillary 321 (*)    All other components within normal limits  I-STAT BETA HCG BLOOD, ED ([facility], [facility], [facility] ONLY)    EKG  EKG Interpretation None       Radiology No results found.  Procedures Procedures (including critical care time)  Medications Ordered in ED Medications  sodium chloride 0.9 % bolus 1,000 mL (1,000 mLs Intravenous New Bag/Given 10/05/16 2343)  fentaNYL (SUBLIMAZE) injection 50 mcg (not administered)  ondansetron (ZOFRAN) injection 4 mg (4 mg Intravenous Given 10/05/16 2335)  gi cocktail (Maalox,Lidocaine,Donnatal) (30 mLs Oral Given 10/05/16 2334)     Initial Impression / Assessment and Plan / ED Course  I have reviewed the triage vital signs and the nursing notes.  Pertinent labs & imaging results that were available during my care of the patient were reviewed by me and considered in my medical decision making (see chart for details).     Patient with hyperglycemia. Also abdominal pain. Not in DKA. Labs reassuring otherwise. Fluids given. Has had multiple reassuring CT scans in the past.  Likely will be able to discharge home blood sugars come down. Care turned over to Dr Stark Jock and [address] PA.  Final Clinical Impressions(s) / ED Diagnoses   Final diagnoses:  Dehydration  Hyperglycemia  Nausea and vomiting, intractability of vomiting not specified, unspecified vomiting type    New Prescriptions New Prescriptions   ONDANSETRON (ZOFRAN-ODT) 4 MG DISINTEGRATING TABLET    Take 1 tablet (4 mg total) by mouth every 8 (eight) hours as needed for nausea or vomiting.     Davonna Belling, MD 10/06/16 [phone]

## 2016-10-05 NOTE — ED Notes (Signed)
Pt's CBG 499.  Informed Lennette Bihari, RN.

## 2016-10-05 NOTE — ED Triage Notes (Signed)
Per EMS, pt reports type DM and compliance with insulin usage. Pt was placed on trulicity recently and CBG has been in 200s until 2 days ago when CBG has been in the 500s since. Endorses nausea and mild LLQ pain x 2 days. Pt unable to keep water down but has been drinking sprite and ginger ale. Pt last took 22 units insulin at 1600. CBG with EMS 560. Pt appears to be in NAD. VS 125/95, HR 102, RR 20, spo2 96% on RA.

## 2016-10-06 LAB — CBG MONITORING, ED
GLUCOSE-CAPILLARY: 338 mg/dL — AB (ref 65–99)
Glucose-Capillary: 499 mg/dL — ABNORMAL HIGH (ref 65–99)

## 2016-10-06 MED ORDER — ONDANSETRON 4 MG PO TBDP: 4.0000 mg | ORAL_TABLET | Freq: Three times a day (TID) | ORAL | 0 refills | Status: DC | PRN

## 2016-10-06 NOTE — ED Provider Notes (Signed)
Care assumed from Delphina Cahill MD at shift change, please see their notes for full documentation of patient's complaint/HPI. Briefly, pt here with hyperglycemia, n/v, and abd pain which seems to be a chronic issue. Results so far show BMP with gluc 540, bicarb mildly low at 19, but anion gap WNL and no ketones on U/A; betaHCG neg, CBC with mildly low WBC 3.8 and chronic anemia and thrombocytopenia. U/A without evidence of infection. Awaiting repeat CBG after fluids finish. Plan is to likely d/c after repeat CBG assuming it's downtrending; pt already received zofran and some pain meds with GI cocktail, and is tolerating PO well.   Physical Exam  BP 115/70   Pulse (!) 112   Temp 99 F (37.2 C) (Oral)   Resp 20   Ht 5\' 4"  (1.626 m)   Wt 112 kg (247 lb)   LMP 09/06/2016 (Approximate)   SpO2 96%   BMI 42.40 kg/m   Physical Exam Gen: afebrile, VSS, NAD HEENT: EOMI, MMM Resp: no resp distress CV: rate WNL (HR 98 during exam) Abd: appearance normal, nondistended MsK: moving all extremities with ease Neuro: A&O x4  ED Course  Procedures Results for orders placed or performed during the hospital encounter of 61/44/31  Basic metabolic panel  Result Value Ref Range   Sodium 130 (L) 135 - 145 mmol/L   Potassium 4.4 3.5 - 5.1 mmol/L   Chloride 101 101 - 111 mmol/L   CO2 19 (L) 22 - 32 mmol/L   Glucose, Bld 540 (HH) 65 - 99 mg/dL   BUN 9 6 - 20 mg/dL   Creatinine, Ser 0.67 0.44 - 1.00 mg/dL   Calcium 9.0 8.9 - 10.3 mg/dL   GFR calc non Af Amer >60 >60 mL/min   GFR calc Af Amer >60 >60 mL/min   Anion gap 10 5 - 15  CBC  Result Value Ref Range   WBC 3.8 (L) 4.0 - 10.5 K/uL   RBC 4.29 3.87 - 5.11 MIL/uL   Hemoglobin 11.2 (L) 12.0 - 15.0 g/dL   HCT 36.5 36.0 - 46.0 %   MCV 85.1 78.0 - 100.0 fL   MCH 26.1 26.0 - 34.0 pg   MCHC 30.7 30.0 - 36.0 g/dL   RDW 15.0 11.5 - 15.5 %   Platelets 113 (L) 150 - 400 K/uL  Urinalysis, Routine w reflex microscopic  Result Value Ref Range   Color,  Urine YELLOW YELLOW   APPearance CLEAR CLEAR   Specific Gravity, Urine 1.029 1.005 - 1.030   pH 5.0 5.0 - 8.0   Glucose, UA >=500 (A) NEGATIVE mg/dL   Hgb urine dipstick NEGATIVE NEGATIVE   Bilirubin Urine NEGATIVE NEGATIVE   Ketones, ur NEGATIVE NEGATIVE mg/dL   Protein, ur NEGATIVE NEGATIVE mg/dL   Nitrite NEGATIVE NEGATIVE   Leukocytes, UA TRACE (A) NEGATIVE   RBC / HPF 0-5 0 - 5 RBC/hpf   WBC, UA 0-5 0 - 5 WBC/hpf   Bacteria, UA RARE (A) NONE SEEN   Squamous Epithelial / LPF 0-5 (A) NONE SEEN  CBG monitoring, ED  Result Value Ref Range   Glucose-Capillary 321 (H) 65 - 99 mg/dL   Comment 1 Notify RN    Comment 2 Document in Chart   I-Stat Beta hCG blood, ED ([facility], [facility], [facility] only)  Result Value Ref Range   I-stat hCG, quantitative <5.0 <5 mIU/mL   Comment 3          CBG monitoring, ED  Result Value Ref Range  Glucose-Capillary 338 (H) 65 - 99 mg/dL   Comment 1 Call MD NNP PA CNM    Ct Abdomen Pelvis W Contrast  Result Date: 09/21/2016 CLINICAL DATA:  Abdominal pain, nausea, distention and vomiting. EXAM: CT ABDOMEN AND PELVIS WITH CONTRAST TECHNIQUE: Multidetector CT imaging of the abdomen and pelvis was performed using the standard protocol following bolus administration of intravenous contrast. CONTRAST:  12mL ISOVUE-300 IOPAMIDOL (ISOVUE-300) INJECTION 61% COMPARISON:  06/02/2016 FINDINGS: Lower chest: Minimal patchy left lung base opacity is unchanged. No confluent consolidation. No effusion. Hepatobiliary: Enlarged liver with marked fatty infiltration. No suspicious liver lesion. Cholecystectomy. Normal bile ducts. Pancreas: Unremarkable. No pancreatic ductal dilatation or surrounding inflammatory changes. Spleen: Moderate splenomegaly without focal lesion, unchanged. Adrenals/Urinary Tract: Adrenal glands are unremarkable. Kidneys are normal, without renal calculi, focal lesion, or hydronephrosis. Bladder is unremarkable. Stomach/Bowel: Stomach is within normal limits. Appendix is  normal. No evidence of bowel wall thickening, distention, or inflammatory changes. Vascular/Lymphatic: No significant vascular findings are present. No enlarged abdominal or pelvic lymph nodes. Reproductive: Uterus and bilateral adnexa are unremarkable. Other: No acute inflammation.  No ascites. Musculoskeletal: No significant skeletal lesion. IMPRESSION: 1. Hepatosplenomegaly. Diffuse fatty infiltration of the liver. This is unchanged from 06/02/2016. 2. No bowel obstruction or inflammation. 3. No inflammatory changes in the abdomen or pelvis. No ascites or adenopathy. Electronically Signed   By: Andreas Newport M.D.   On: 09/21/2016 03:02     Meds ordered this encounter  Medications  . sodium chloride 0.9 % bolus 1,000 mL  . ondansetron (ZOFRAN) injection 4 mg  . gi cocktail (Maalox,Lidocaine,Donnatal)  . fentaNYL (SUBLIMAZE) injection 50 mcg  . ondansetron (ZOFRAN-ODT) 4 MG disintegrating tablet    Sig: Take 1 tablet (4 mg total) by mouth every 8 (eight) hours as needed for nausea or vomiting.    Dispense:  8 tablet    Refill:  0     MDM:   ICD-10-CM   1. Dehydration E86.0   2. Hyperglycemia R73.9   3. Nausea and vomiting, intractability of vomiting not specified, unspecified vomiting type R11.2     1:16 AM Repeat CBG 338 which is similar to her prior visits when she's been discharged; not trending up at this point; she can go home and use her home insulins which she didn't take this afternoon, doubt need for use of insulin or more fluids here at this time. Pt feeling improved, tolerating PO well. Will d/c home with previously outlined plan per Dr. Alvino Chapel. Advised f/up with her PCP in 3-5 days for recheck. Zofran rx given by Dr. Alvino Chapel. Advised adequate hydration and use of usual home meds. I explained the diagnosis and have given explicit precautions to return to the ER including for any other new or worsening symptoms. The patient understands and accepts the medical plan as it's  been dictated and I have answered their questions. Discharge instructions concerning home care and prescriptions have been given. The patient is STABLE and is discharged to home in good condition.     [address] [place], [place] 10/06/16 7408    Davonna Belling, MD 10/08/16 1534

## 2016-10-06 NOTE — Discharge Instructions (Addendum)
Follow-up with your doctors. Use zofran as directed as needed for nausea. Stay well hydrated. Return to the ER for emergent changes or worsening symptoms.

## 2016-10-06 NOTE — ED Notes (Addendum)
Pt departed in NAD, refused use of wheelchair. Pt given taxi voucher to get home.

## 2016-10-06 NOTE — ED Notes (Signed)
Pt called out, experiencing mild nose bleed. Bleeding stopped at this time.

## 2016-10-11 DIAGNOSIS — E1165 Type 2 diabetes mellitus with hyperglycemia: Secondary | ICD-10-CM | POA: Diagnosis not present

## 2016-10-11 DIAGNOSIS — F319 Bipolar disorder, unspecified: Secondary | ICD-10-CM | POA: Diagnosis not present

## 2016-10-11 DIAGNOSIS — I10 Essential (primary) hypertension: Secondary | ICD-10-CM | POA: Diagnosis not present

## 2016-10-11 DIAGNOSIS — B373 Candidiasis of vulva and vagina: Secondary | ICD-10-CM | POA: Diagnosis not present

## 2016-10-11 DIAGNOSIS — M797 Fibromyalgia: Secondary | ICD-10-CM | POA: Diagnosis not present

## 2016-10-11 DIAGNOSIS — F250 Schizoaffective disorder, bipolar type: Secondary | ICD-10-CM | POA: Diagnosis not present

## 2016-10-12 ENCOUNTER — Encounter: Payer: Self-pay | Admitting: Gastroenterology

## 2016-10-12 DIAGNOSIS — I10 Essential (primary) hypertension: Secondary | ICD-10-CM | POA: Diagnosis not present

## 2016-10-12 DIAGNOSIS — E119 Type 2 diabetes mellitus without complications: Secondary | ICD-10-CM | POA: Diagnosis not present

## 2016-10-15 ENCOUNTER — Encounter (HOSPITAL_COMMUNITY): Payer: Self-pay

## 2016-10-15 DIAGNOSIS — K5901 Slow transit constipation: Secondary | ICD-10-CM | POA: Diagnosis not present

## 2016-10-15 DIAGNOSIS — R1012 Left upper quadrant pain: Secondary | ICD-10-CM | POA: Diagnosis not present

## 2016-10-15 DIAGNOSIS — K760 Fatty (change of) liver, not elsewhere classified: Secondary | ICD-10-CM | POA: Diagnosis not present

## 2016-10-15 DIAGNOSIS — Z85028 Personal history of other malignant neoplasm of stomach: Secondary | ICD-10-CM | POA: Diagnosis not present

## 2016-10-15 DIAGNOSIS — E860 Dehydration: Secondary | ICD-10-CM | POA: Insufficient documentation

## 2016-10-15 DIAGNOSIS — I10 Essential (primary) hypertension: Secondary | ICD-10-CM | POA: Insufficient documentation

## 2016-10-15 DIAGNOSIS — E1165 Type 2 diabetes mellitus with hyperglycemia: Secondary | ICD-10-CM | POA: Diagnosis not present

## 2016-10-15 DIAGNOSIS — Z794 Long term (current) use of insulin: Secondary | ICD-10-CM | POA: Insufficient documentation

## 2016-10-15 DIAGNOSIS — Z79899 Other long term (current) drug therapy: Secondary | ICD-10-CM | POA: Insufficient documentation

## 2016-10-15 DIAGNOSIS — R140 Abdominal distension (gaseous): Secondary | ICD-10-CM | POA: Diagnosis not present

## 2016-10-15 LAB — URINALYSIS, ROUTINE W REFLEX MICROSCOPIC
BILIRUBIN URINE: NEGATIVE
Glucose, UA: >=500 mg/dL — AB
KETONES UR: NEGATIVE mg/dL
Nitrite: NEGATIVE
PH: 5 (ref 5.0–8.0)
Protein, ur: 30 mg/dL — AB
Specific Gravity, Urine: 1.022 (ref 1.005–1.030)

## 2016-10-15 LAB — CBC
HEMATOCRIT: 34.8 % — AB (ref 36.0–46.0)
HEMOGLOBIN: 11.1 g/dL — AB (ref 12.0–15.0)
MCH: 26.3 pg (ref 26.0–34.0)
MCHC: 31.9 g/dL (ref 30.0–36.0)
MCV: 82.5 fL (ref 78.0–100.0)
Platelets: 109 10*3/uL — ABNORMAL LOW (ref 150–400)
RBC: 4.22 MIL/uL (ref 3.87–5.11)
RDW: 15.3 % (ref 11.5–15.5)
WBC: 3.3 10*3/uL — ABNORMAL LOW (ref 4.0–10.5)

## 2016-10-15 LAB — COMPREHENSIVE METABOLIC PANEL
ALBUMIN: 3.8 g/dL (ref 3.5–5.0)
ALK PHOS: 83 U/L (ref 38–126)
ALT: 26 U/L (ref 14–54)
ANION GAP: 11 (ref 5–15)
AST: 53 U/L — ABNORMAL HIGH (ref 15–41)
BUN: 12 mg/dL (ref 6–20)
CALCIUM: 8.7 mg/dL — AB (ref 8.9–10.3)
CO2: 18 mmol/L — AB (ref 22–32)
Chloride: 103 mmol/L (ref 101–111)
Creatinine, Ser: 0.66 mg/dL (ref 0.44–1.00)
GFR calc non Af Amer: >60 mL/min (ref >60)
Glucose, Bld: 386 mg/dL — ABNORMAL HIGH (ref 65–99)
POTASSIUM: 3.9 mmol/L (ref 3.5–5.1)
SODIUM: 132 mmol/L — AB (ref 135–145)
Total Bilirubin: 0.3 mg/dL (ref 0.3–1.2)
Total Protein: 6.9 g/dL (ref 6.5–8.1)

## 2016-10-15 LAB — LIPASE, BLOOD: LIPASE: 43 U/L (ref 11–51)

## 2016-10-15 NOTE — ED Triage Notes (Signed)
Pt states that last night she started having LUQ abd pain along with vomiting x 4 and fatigue. Denies diarrhea and fevers.

## 2016-10-16 ENCOUNTER — Emergency Department (HOSPITAL_COMMUNITY)
Admission: EM | Admit: 2016-10-16 | Discharge: 2016-10-16 | Disposition: A | Discharge status: Home / Self Care | Payer: Medicare HMO | Attending: Emergency Medicine | Admitting: Emergency Medicine

## 2016-10-16 ENCOUNTER — Emergency Department (HOSPITAL_COMMUNITY): Payer: Medicare HMO

## 2016-10-16 DIAGNOSIS — E860 Dehydration: Secondary | ICD-10-CM

## 2016-10-16 DIAGNOSIS — K5901 Slow transit constipation: Secondary | ICD-10-CM

## 2016-10-16 DIAGNOSIS — R140 Abdominal distension (gaseous): Secondary | ICD-10-CM | POA: Diagnosis not present

## 2016-10-16 DIAGNOSIS — R1012 Left upper quadrant pain: Secondary | ICD-10-CM

## 2016-10-16 DIAGNOSIS — K760 Fatty (change of) liver, not elsewhere classified: Secondary | ICD-10-CM | POA: Diagnosis not present

## 2016-10-16 DIAGNOSIS — R739 Hyperglycemia, unspecified: Secondary | ICD-10-CM

## 2016-10-16 LAB — PREGNANCY, URINE: Preg Test, Ur: NEGATIVE

## 2016-10-16 LAB — SALICYLATE LEVEL: Salicylate Lvl: <7 mg/dL (ref 2.8–30.0)

## 2016-10-16 LAB — CBG MONITORING, ED: Glucose-Capillary: 296 mg/dL — ABNORMAL HIGH (ref 65–99)

## 2016-10-16 MED ORDER — ONDANSETRON HCL 4 MG/2ML IJ SOLN
4.0000 mg | Freq: Once | INTRAMUSCULAR | Status: AC
  Administered 2016-10-16: 4 mg via INTRAVENOUS
  Filled 2016-10-16: qty 2

## 2016-10-16 MED ORDER — FENTANYL CITRATE (PF) 100 MCG/2ML IJ SOLN
25.0000 ug | Freq: Once | INTRAMUSCULAR | Status: AC
  Administered 2016-10-16: 25 ug via INTRAVENOUS
  Filled 2016-10-16: qty 2

## 2016-10-16 MED ORDER — SODIUM CHLORIDE 0.9 % IV BOLUS (SEPSIS)
1000.0000 mL | Freq: Once | INTRAVENOUS | Status: AC
  Administered 2016-10-16: 1000 mL via INTRAVENOUS

## 2016-10-16 MED ORDER — FENTANYL CITRATE (PF) 100 MCG/2ML IJ SOLN
50.0000 ug | Freq: Once | INTRAMUSCULAR | Status: AC
  Administered 2016-10-16: 50 ug via INTRAVENOUS
  Filled 2016-10-16: qty 2

## 2016-10-16 MED ORDER — IOPAMIDOL (ISOVUE-300) INJECTION 61%
INTRAVENOUS | Status: AC
  Administered 2016-10-16: 100 mL via INTRAVENOUS
  Filled 2016-10-16: qty 100

## 2016-10-16 NOTE — ED Notes (Signed)
Pt requesting additional pain medication.  

## 2016-10-16 NOTE — ED Provider Notes (Signed)
[facility] DEPT Provider Note   CSN: [id number] Arrival date & time: 10/15/16  2228     History   Chief Complaint Chief Complaint  Patient presents with  . Abdominal Pain    HPI Tina Saunders is a 31 y.o. female.  The history is provided by the patient.  Abdominal Pain   This is a new problem. The current episode started more than 2 days ago. The problem occurs daily. The problem has been gradually worsening. The pain is located in the LUQ. The quality of the pain is aching. The pain is at a severity of 9/10. Associated symptoms include nausea and vomiting. Pertinent negatives include fever, diarrhea, melena, constipation and dysuria. The symptoms are aggravated by palpation. Nothing relieves the symptoms.  patient with h/o bipolar, diabetes, obesity presents with LUQ abdominal pain for a week It is worsening No trauma/falls No CP/SOB She does not recall having this pain before  Past Medical History:  Diagnosis Date  . Anxiety   . Bipolar 1 disorder ([place])   . Cancer of abdominal wall   . Depression   . Diabetes mellitus without complication ([place])   . Hypertension   . Obesity   . Obesity   . Polycystic ovarian syndrome 07/01/2011   Patient report  . 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 Schizophrenia [facility])     Patient Active Problem List   Diagnosis Date Noted  . Schizoaffective disorder, bipolar type ([place]) 05/30/2014  . Suicidal ideation   . 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 Headache 04/15/2013  . HTN (hypertension) 04/15/2013  . Post traumatic stress disorder 12/07/2011  . CAP (community acquired pneumonia) 08/27/2011  . Chest pain 08/26/2011  . SOB (shortness of breath) 08/26/2011  . Fever 08/26/2011  . Hypokalemia 08/26/2011  . PSVT (paroxysmal supraventricular tachycardia) ([place]) 08/26/2011  . ADHD 09/23/2007  . EPIGASTRIC PAIN 09/23/2007  . Obesity, unspecified 07/30/2007  . DEPRESSION 07/30/2007  . SLEEP DISORDER 07/30/2007  . IMPAIRED FASTING GLUCOSE  07/30/2007  . FATIGUE 11/21/2006  . ABNORMAL FINDINGS, ELEVATED BP W/O HTN 11/21/2006  . METRORRHAGIA 06/13/2006  . DISORDER, MENSTRUAL NEC 06/13/2006  . DIZZINESS 06/13/2006  . POLYCYSTIC OVARIAN DISEASE 04/25/2006  . AMENORRHEA, SECONDARY 04/20/2006  . ACNE, MILD 04/20/2006  . ABDOMINAL PAIN 04/20/2006    Past Surgical History:  Procedure Laterality Date  . CHOLECYSTECTOMY    . HERNIA REPAIR    . Ovarian Cyst Excision    . VARICOSE VEIN SURGERY      OB History    Gravida Para Term Preterm AB Living   0             SAB TAB Ectopic Multiple Live Births                   Home Medications    Prior to Admission medications   Medication Sig Start Date End Date Taking? Authorizing Provider  albuterol (PROVENTIL HFA;VENTOLIN HFA) 108 (90 Base) MCG/ACT inhaler Inhale 1 puff into the lungs every 4 (four) hours as needed for wheezing or shortness of breath.    [provider]  busPIRone (BUSPAR) 15 MG tablet Take 15 mg by mouth 3 (three) times daily.    [provider]  chlorproMAZINE (THORAZINE) 25 MG tablet Take 50 mg by mouth 3 (three) times daily.     [provider]  cloNIDine (CATAPRES) 0.1 MG tablet Take 0.1 mg by mouth 2 (two) times daily.    [provider]  cyclobenzaprine (FLEXERIL)  5 MG tablet Take 5 mg by mouth 2 (two) times daily.    [provider]  dicyclomine (BENTYL) 20 MG tablet Take 20 mg by mouth every 8 (eight) hours as needed for spasms.    [provider]  docusate sodium (COLACE) 100 MG capsule Take 100 mg by mouth 2 (two) times daily.    [provider]  fenofibrate (TRICOR) 145 MG tablet Take 145 mg by mouth daily.    [provider]  gabapentin (NEURONTIN) 400 MG capsule Take 1 capsule (400 mg total) by mouth 3 (three) times daily. 04/05/16   Patrecia Pour, NP  gabapentin (NEURONTIN) 800 MG tablet Take 800 mg by mouth 3 (three) times daily.    [provider]  ibuprofen  (ADVIL,MOTRIN) 800 MG tablet Take 1 tablet (800 mg total) by mouth every 8 (eight) hours as needed for moderate pain. 01/08/15   Domenic Moras, PA-C  insulin aspart (NOVOLOG FLEXPEN) 100 UNIT/ML FlexPen Inject 22 Units into the skin 3 (three) times daily with meals.    [provider]  insulin detemir (LEVEMIR) 100 UNIT/ML injection Inject 57 Units into the skin 2 (two) times daily.     [provider]  lactulose (CHRONULAC) 10 GM/15ML solution Take 20 g by mouth 2 (two) times daily.    [provider]  lisinopril (PRINIVIL,ZESTRIL) 10 MG tablet Take 10 mg by mouth daily.    [provider]  LORazepam (ATIVAN) 0.5 MG tablet Take 0.5 mg by mouth daily at 12 noon.    [provider]  meloxicam (MOBIC) 7.5 MG tablet Take 7.5 mg by mouth 2 (two) times daily.    [provider]  norgestimate-ethinyl estradiol (ORTHO-CYCLEN,SPRINTEC,PREVIFEM) 0.25-35 MG-MCG tablet Take 1 tablet by mouth daily. 11/24/15   Truett Mainland, DO  ondansetron (ZOFRAN-ODT) 4 MG disintegrating tablet Take 1 tablet (4 mg total) by mouth every 8 (eight) hours as needed for nausea or vomiting. 10/06/16   Davonna Belling, MD  pantoprazole (PROTONIX) 40 MG tablet Take 40 mg by mouth at bedtime.    [provider]  polyethylene glycol (MIRALAX / GLYCOLAX) packet Take 17 g by mouth daily.    [provider]  QUEtiapine (SEROQUEL) 300 MG tablet Take 1 tablet (300 mg total) by mouth 2 (two) times daily. 04/05/16   Patrecia Pour, NP  QUEtiapine (SEROQUEL) 400 MG tablet Take 800 mg by mouth at bedtime.    [provider]  sertraline (ZOLOFT) 100 MG tablet Take 200 mg by mouth every morning.     [provider]  simvastatin (ZOCOR) 10 MG tablet Take 10 mg by mouth at bedtime.    [provider]  topiramate (TOPAMAX) 100 MG tablet Take 1 tablet (100 mg total) by mouth 2 (two) times daily. 04/05/16   Patrecia Pour, NP  topiramate (TOPAMAX) 25 MG  tablet Take 25 mg by mouth 2 (two) times daily.    [provider]  traMADol (ULTRAM) 50 MG tablet Take 25 mg by mouth every 12 (twelve) hours as needed for moderate pain.    [provider]  traZODone (DESYREL) 50 MG tablet Take 25 mg by mouth See admin instructions. Takes 1/2 tab at 0800, 1200, then takes 2 tabs at bedtime 2000    [provider]  Vitamin D, Ergocalciferol, (DRISDOL) 50000 units CAPS capsule Take 50,000 Units by mouth every Thursday.     [provider]    Family History Family History  Problem  Relation Age of Onset  . Coronary artery disease Maternal Grandmother   . Diabetes type II Maternal Grandmother   . Cancer Maternal Grandmother   . Hypertension Mother   . Hypertension Father     Social History Social History  Substance Use Topics  . Smoking status: Never Smoker  . Smokeless tobacco: Never Used  . Alcohol use No     Allergies   Fish-derived products; Geodon [ziprasidone hcl]; Haldol [haloperidol lactate]; Buprenorphine hcl; Compazine [prochlorperazine]; Morphine and related; and Toradol [ketorolac tromethamine]   Review of Systems Review of Systems  Constitutional: Negative for fever.  Respiratory: Negative for shortness of breath.   Cardiovascular: Negative for chest pain.  Gastrointestinal: Positive for abdominal pain, nausea and vomiting. Negative for blood in stool, constipation, diarrhea and melena.  Genitourinary: Negative for dysuria and vaginal bleeding.  All other systems reviewed and are negative.    Physical Exam Updated Vital Signs BP 129/72 (BP Location: Right Arm)   Pulse (!) 107   Temp (!) 97.5 F (36.4 C) (Oral)   Resp 18   Ht 1.626 m (5\' 4" )   Wt 111.1 kg (245 lb)   SpO2 99%   BMI 42.05 kg/m   Physical Exam CONSTITUTIONAL: Chronically ill appearing HEAD: Normocephalic/atraumatic EYES: EOMI/PERRL ENMT: Mucous membranes moist NECK: supple no meningeal signs SPINE/BACK:entire spine  nontender CV: S1/S2 noted, no murmurs/rubs/gallops noted LUNGS: Lungs are clear to auscultation bilaterally, no apparent distress ABDOMEN: obese soft, moderate LUQ tenderness, no rebound or guarding, bowel sounds noted throughout abdomen GU:no cva tenderness NEURO: Pt is awake/alert/appropriate, moves all extremitiesx4.  No facial droop.   EXTREMITIES: pulses normal/equal, full ROM SKIN: warm, color normal PSYCH: no abnormalities of mood noted, alert and oriented to situation   ED Treatments / Results  Labs (all labs ordered are listed, but only abnormal results are displayed) Labs Reviewed  COMPREHENSIVE METABOLIC PANEL - Abnormal; Notable for the following:       Result Value   Sodium 132 (*)    CO2 18 (*)    Glucose, Bld 386 (*)    Calcium 8.7 (*)    AST 53 (*)    All other components within normal limits  CBC - Abnormal; Notable for the following:    WBC 3.3 (*)    Hemoglobin 11.1 (*)    HCT 34.8 (*)    Platelets 109 (*)    All other components within normal limits  URINALYSIS, ROUTINE W REFLEX MICROSCOPIC - Abnormal; Notable for the following:    APPearance CLOUDY (*)    Glucose, UA >=500 (*)    Hgb urine dipstick SMALL (*)    Protein, ur 30 (*)    Leukocytes, UA LARGE (*)    Bacteria, UA FEW (*)    Squamous Epithelial / LPF 6-30 (*)    All other components within normal limits  LIPASE, BLOOD  PREGNANCY, URINE  SALICYLATE LEVEL    EKG  EKG Interpretation None       Radiology No results found.  Procedures Procedures (including critical care time)  Medications Ordered in ED Medications  iopamidol (ISOVUE-300) 61 % injection (not administered)  sodium chloride 0.9 % bolus 1,000 mL (1,000 mLs Intravenous New Bag/Given 10/16/16 0526)  ondansetron (ZOFRAN) injection 4 mg (4 mg Intravenous Given 10/16/16 0527)  fentaNYL (SUBLIMAZE) injection 50 mcg (50 mcg Intravenous Given 10/16/16 0528)     Initial Impression / Assessment and Plan / ED Course  I have  reviewed the triage vital signs and the  nursing notes.  Pertinent labs & imaging results that were available during my care of the patient were reviewed by me and considered in my medical decision making (see chart for details).     5:59 AM Pt here for LUQ pain for a week She has multiple medical conditions, including splenomegaly on previous imaging and now with pain in that region She reports nausea/vomiting Previous imaging was for RLQ pain Will perform CT imaging for LUQ pain She is also dehydrated with hyperglycemia without anion gap Also - she told nurse she drank whole bottle of pepto bismol, will check ASA level 7:54 AM Ct scan reveals moderate stool burden, no other acute findings Pt improved CBG improved She is appropriate for d/c home Advised NOT to use pepto She can f/u as outpatient  Final Clinical Impressions(s) / ED Diagnoses   Final diagnoses:  Left upper quadrant pain  Slow transit constipation  Hyperglycemia  Dehydration    New Prescriptions New Prescriptions   No medications on file     Ripley Fraise, MD 10/16/16 [id number]

## 2016-10-16 NOTE — ED Notes (Signed)
Pt reports drinking entire bottle of pepto bismol yesterday in attempts to resolve abd pain.  MD aware, salicylate level ordered and drawn

## 2016-10-17 DIAGNOSIS — M797 Fibromyalgia: Secondary | ICD-10-CM | POA: Diagnosis not present

## 2016-10-17 DIAGNOSIS — F250 Schizoaffective disorder, bipolar type: Secondary | ICD-10-CM | POA: Diagnosis not present

## 2016-10-17 DIAGNOSIS — I10 Essential (primary) hypertension: Secondary | ICD-10-CM | POA: Diagnosis not present

## 2016-10-17 DIAGNOSIS — F319 Bipolar disorder, unspecified: Secondary | ICD-10-CM | POA: Diagnosis not present

## 2016-10-17 DIAGNOSIS — E1165 Type 2 diabetes mellitus with hyperglycemia: Secondary | ICD-10-CM | POA: Diagnosis not present

## 2016-10-17 DIAGNOSIS — B373 Candidiasis of vulva and vagina: Secondary | ICD-10-CM | POA: Diagnosis not present

## 2016-10-18 LAB — URINE CULTURE

## 2016-10-20 DIAGNOSIS — B373 Candidiasis of vulva and vagina: Secondary | ICD-10-CM | POA: Diagnosis not present

## 2016-10-20 DIAGNOSIS — F250 Schizoaffective disorder, bipolar type: Secondary | ICD-10-CM | POA: Diagnosis not present

## 2016-10-20 DIAGNOSIS — E1165 Type 2 diabetes mellitus with hyperglycemia: Secondary | ICD-10-CM | POA: Diagnosis not present

## 2016-10-20 DIAGNOSIS — I10 Essential (primary) hypertension: Secondary | ICD-10-CM | POA: Diagnosis not present

## 2016-10-20 DIAGNOSIS — F319 Bipolar disorder, unspecified: Secondary | ICD-10-CM | POA: Diagnosis not present

## 2016-10-20 DIAGNOSIS — M797 Fibromyalgia: Secondary | ICD-10-CM | POA: Diagnosis not present

## 2016-10-26 ENCOUNTER — Encounter (HOSPITAL_COMMUNITY): Payer: Self-pay

## 2016-10-26 ENCOUNTER — Emergency Department (HOSPITAL_COMMUNITY): Payer: Medicare HMO

## 2016-10-26 ENCOUNTER — Emergency Department (HOSPITAL_COMMUNITY)
Admission: EM | Admit: 2016-10-26 | Discharge: 2016-10-26 | Disposition: A | Discharge status: Home / Self Care | Payer: Medicare HMO | Attending: Emergency Medicine | Admitting: Emergency Medicine

## 2016-10-26 DIAGNOSIS — E119 Type 2 diabetes mellitus without complications: Secondary | ICD-10-CM | POA: Insufficient documentation

## 2016-10-26 DIAGNOSIS — R102 Pelvic and perineal pain: Secondary | ICD-10-CM | POA: Insufficient documentation

## 2016-10-26 DIAGNOSIS — F259 Schizoaffective disorder, unspecified: Secondary | ICD-10-CM | POA: Diagnosis not present

## 2016-10-26 DIAGNOSIS — R109 Unspecified abdominal pain: Secondary | ICD-10-CM | POA: Diagnosis not present

## 2016-10-26 DIAGNOSIS — E138 Other specified diabetes mellitus with unspecified complications: Secondary | ICD-10-CM | POA: Diagnosis not present

## 2016-10-26 DIAGNOSIS — Z8742 Personal history of other diseases of the female genital tract: Secondary | ICD-10-CM | POA: Insufficient documentation

## 2016-10-26 DIAGNOSIS — R1031 Right lower quadrant pain: Secondary | ICD-10-CM | POA: Diagnosis not present

## 2016-10-26 DIAGNOSIS — I10 Essential (primary) hypertension: Secondary | ICD-10-CM | POA: Insufficient documentation

## 2016-10-26 DIAGNOSIS — Z79899 Other long term (current) drug therapy: Secondary | ICD-10-CM | POA: Insufficient documentation

## 2016-10-26 DIAGNOSIS — K2970 Gastritis, unspecified, without bleeding: Secondary | ICD-10-CM | POA: Diagnosis not present

## 2016-10-26 DIAGNOSIS — R112 Nausea with vomiting, unspecified: Secondary | ICD-10-CM | POA: Diagnosis not present

## 2016-10-26 DIAGNOSIS — N390 Urinary tract infection, site not specified: Secondary | ICD-10-CM | POA: Diagnosis not present

## 2016-10-26 DIAGNOSIS — E785 Hyperlipidemia, unspecified: Secondary | ICD-10-CM | POA: Diagnosis not present

## 2016-10-26 DIAGNOSIS — Z794 Long term (current) use of insulin: Secondary | ICD-10-CM | POA: Diagnosis not present

## 2016-10-26 LAB — URINALYSIS, ROUTINE W REFLEX MICROSCOPIC
BILIRUBIN URINE: NEGATIVE
Hgb urine dipstick: NEGATIVE
KETONES UR: NEGATIVE mg/dL
Nitrite: NEGATIVE
PH: 6 (ref 5.0–8.0)
PROTEIN: 30 mg/dL — AB
SPECIFIC GRAVITY, URINE: 1.036 — AB (ref 1.005–1.030)

## 2016-10-26 LAB — CBC
HCT: 34.6 % — ABNORMAL LOW (ref 36.0–46.0)
Hemoglobin: 10.9 g/dL — ABNORMAL LOW (ref 12.0–15.0)
MCH: 25.2 pg — AB (ref 26.0–34.0)
MCHC: 31.5 g/dL (ref 30.0–36.0)
MCV: 80.1 fL (ref 78.0–100.0)
PLATELETS: 128 10*3/uL — AB (ref 150–400)
RBC: 4.32 MIL/uL (ref 3.87–5.11)
RDW: 15 % (ref 11.5–15.5)
WBC: 4.2 10*3/uL (ref 4.0–10.5)

## 2016-10-26 LAB — COMPREHENSIVE METABOLIC PANEL
ALK PHOS: 102 U/L (ref 38–126)
ALT: 32 U/L (ref 14–54)
AST: 48 U/L — AB (ref 15–41)
Albumin: 4 g/dL (ref 3.5–5.0)
Anion gap: 11 (ref 5–15)
BUN: 7 mg/dL (ref 6–20)
CALCIUM: 9 mg/dL (ref 8.9–10.3)
CHLORIDE: 103 mmol/L (ref 101–111)
CO2: 21 mmol/L — AB (ref 22–32)
CREATININE: 0.5 mg/dL (ref 0.44–1.00)
GFR calc non Af Amer: >60 mL/min (ref >60)
GLUCOSE: 342 mg/dL — AB (ref 65–99)
Potassium: 3.6 mmol/L (ref 3.5–5.1)
SODIUM: 135 mmol/L (ref 135–145)
Total Bilirubin: 0.7 mg/dL (ref 0.3–1.2)
Total Protein: 7.5 g/dL (ref 6.5–8.1)

## 2016-10-26 LAB — LIPASE, BLOOD: LIPASE: 47 U/L (ref 11–51)

## 2016-10-26 LAB — PREGNANCY, URINE: PREG TEST UR: NEGATIVE

## 2016-10-26 MED ORDER — IBUPROFEN 400 MG PO TABS: 400.0000 mg | ORAL_TABLET | Freq: Four times a day (QID) | ORAL | 0 refills | Status: DC | PRN

## 2016-10-26 MED ORDER — IOPAMIDOL (ISOVUE-300) INJECTION 61%
INTRAVENOUS | Status: AC
  Administered 2016-10-26: 100 mL via INTRAVENOUS
  Filled 2016-10-26: qty 100

## 2016-10-26 MED ORDER — IOPAMIDOL (ISOVUE-300) INJECTION 61%
100.0000 mL | Freq: Once | INTRAVENOUS | Status: AC | PRN
  Administered 2016-10-26: 100 mL via INTRAVENOUS

## 2016-10-26 MED ORDER — ACETAMINOPHEN ER 650 MG PO TBCR: 650.0000 mg | EXTENDED_RELEASE_TABLET | Freq: Three times a day (TID) | ORAL | 0 refills | Status: DC | PRN

## 2016-10-26 MED ORDER — HYDROMORPHONE HCL 1 MG/ML IJ SOLN
1.0000 mg | Freq: Once | INTRAMUSCULAR | Status: AC
  Administered 2016-10-26: 1 mg via INTRAVENOUS
  Filled 2016-10-26: qty 1

## 2016-10-26 NOTE — Discharge Instructions (Signed)
All the results in the ER are normal, labs and imaging. We are not sure what is causing your symptoms. The workup in the ER is not complete, and is limited to screening for life threatening and emergent conditions only, so please see a primary care doctor for further evaluation.  

## 2016-10-26 NOTE — ED Provider Notes (Signed)
[facility] DEPT Provider Note   CSN: [id number] Arrival date & time: 10/26/16  0241     History   Chief Complaint Chief Complaint  Patient presents with  . Abdominal Pain  . Hyperglycemia    HPI Tina Saunders is a 31 y.o. female.  HPI Patient with history of polycystic ovarian syndrome, diabetes, morbid obesity comes in with chief complaint of right lower quadrant abdominal pain. Patient reports that she started experiencing right-sided abdominal pain at 10 AM yesterday. The pain is constant, sharp and severe. Patient has associated nausea with vomiting and loose bowel movements. Patient denies any dysuria, hematuria, urinary frequency. Patient has a history of ovarian cyst however she does not have right lower quadrant abdominal pain typically with her ovarian cyst.   Past Medical History:  Diagnosis Date  . Anxiety   . Bipolar 1 disorder ([place])   . Cancer of abdominal wall   . Depression   . Diabetes mellitus without complication ([place])   . Hypertension   . Obesity   . Obesity   . Polycystic ovarian syndrome 07/01/2011   Patient report  . 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 Schizophrenia [facility])     Patient Active Problem List   Diagnosis Date Noted  . Schizoaffective disorder, bipolar type ([place]) 05/30/2014  . Suicidal ideation   . 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 Headache 04/15/2013  . HTN (hypertension) 04/15/2013  . Post traumatic stress disorder 12/07/2011  . CAP (community acquired pneumonia) 08/27/2011  . Chest pain 08/26/2011  . SOB (shortness of breath) 08/26/2011  . Fever 08/26/2011  . Hypokalemia 08/26/2011  . PSVT (paroxysmal supraventricular tachycardia) ([place]) 08/26/2011  . ADHD 09/23/2007  . EPIGASTRIC PAIN 09/23/2007  . Obesity, unspecified 07/30/2007  . DEPRESSION 07/30/2007  . SLEEP DISORDER 07/30/2007  . IMPAIRED FASTING GLUCOSE 07/30/2007  . FATIGUE 11/21/2006  . ABNORMAL FINDINGS, ELEVATED BP W/O HTN 11/21/2006  . METRORRHAGIA 06/13/2006  .  DISORDER, MENSTRUAL NEC 06/13/2006  . DIZZINESS 06/13/2006  . POLYCYSTIC OVARIAN DISEASE 04/25/2006  . AMENORRHEA, SECONDARY 04/20/2006  . ACNE, MILD 04/20/2006  . ABDOMINAL PAIN 04/20/2006    Past Surgical History:  Procedure Laterality Date  . CHOLECYSTECTOMY    . HERNIA REPAIR    . Ovarian Cyst Excision    . VARICOSE VEIN SURGERY      OB History    Gravida Para Term Preterm AB Living   0             SAB TAB Ectopic Multiple Live Births                   Home Medications    Prior to Admission medications   Medication Sig Start Date End Date Taking? Authorizing Provider  acetaminophen (TYLENOL 8 HOUR) 650 MG CR tablet Take 1 tablet (650 mg total) by mouth every 8 (eight) hours as needed for pain. 10/26/16   Varney Biles, MD  albuterol (PROVENTIL HFA;VENTOLIN HFA) 108 (90 Base) MCG/ACT inhaler Inhale 1 puff into the lungs every 4 (four) hours as needed for wheezing or shortness of breath.    [provider]  busPIRone (BUSPAR) 15 MG tablet Take 15 mg by mouth 3 (three) times daily.    [provider]  chlorproMAZINE (THORAZINE) 25 MG tablet Take 50 mg by mouth 3 (three) times daily.     [provider]  cloNIDine (CATAPRES) 0.1 MG tablet Take 0.1 mg by mouth 2 (two) times daily.    [provider]  cyclobenzaprine (FLEXERIL) 5 MG tablet Take 5 mg by mouth 2 (two) times daily.    [provider]  dicyclomine (BENTYL) 20 MG tablet Take 20 mg by mouth every 8 (eight) hours as needed for spasms.    [provider]  docusate sodium (COLACE) 100 MG capsule Take 100 mg by mouth 2 (two) times daily.    [provider]  fenofibrate (TRICOR) 145 MG tablet Take 145 mg by mouth daily.    [provider]  gabapentin (NEURONTIN) 400 MG capsule Take 1 capsule (400 mg total) by mouth 3 (three) times daily. 04/05/16   Patrecia Pour, NP  gabapentin (NEURONTIN) 800 MG tablet Take 800 mg by mouth 3 (three) times daily.     [provider]  ibuprofen (ADVIL,MOTRIN) 400 MG tablet Take 1 tablet (400 mg total) by mouth every 6 (six) hours as needed. 10/26/16   Varney Biles, MD  insulin aspart (NOVOLOG FLEXPEN) 100 UNIT/ML FlexPen Inject 22 Units into the skin 3 (three) times daily with meals.    [provider]  insulin detemir (LEVEMIR) 100 UNIT/ML injection Inject 57 Units into the skin 2 (two) times daily.     [provider]  lactulose (CHRONULAC) 10 GM/15ML solution Take 20 g by mouth 2 (two) times daily.    [provider]  lisinopril (PRINIVIL,ZESTRIL) 10 MG tablet Take 10 mg by mouth daily.    [provider]  LORazepam (ATIVAN) 0.5 MG tablet Take 0.5 mg by mouth daily at 12 noon.    [provider]  meloxicam (MOBIC) 7.5 MG tablet Take 7.5 mg by mouth 2 (two) times daily.    [provider]  norgestimate-ethinyl estradiol (ORTHO-CYCLEN,SPRINTEC,PREVIFEM) 0.25-35 MG-MCG tablet Take 1 tablet by mouth daily. 11/24/15   Truett Mainland, DO  ondansetron (ZOFRAN-ODT) 4 MG disintegrating tablet Take 1 tablet (4 mg total) by mouth every 8 (eight) hours as needed for nausea or vomiting. 10/06/16   Davonna Belling, MD  pantoprazole (PROTONIX) 40 MG tablet Take 40 mg by mouth at bedtime.    [provider]  polyethylene glycol (MIRALAX / GLYCOLAX) packet Take 17 g by mouth daily.    [provider]  QUEtiapine (SEROQUEL) 300 MG tablet Take 1 tablet (300 mg total) by mouth 2 (two) times daily. 04/05/16   Patrecia Pour, NP  QUEtiapine (SEROQUEL) 400 MG tablet Take 800 mg by mouth at bedtime.    [provider]  sertraline (ZOLOFT) 100 MG tablet Take 200 mg by mouth every morning.     [provider]  simvastatin (ZOCOR) 10 MG tablet Take 10 mg by mouth at bedtime.    [provider]  topiramate (TOPAMAX) 100 MG tablet Take 1 tablet (100 mg total) by mouth 2 (two) times daily. 04/05/16   Patrecia Pour, NP    topiramate (TOPAMAX) 25 MG tablet Take 25 mg by mouth 2 (two) times daily.    [provider]  traMADol (ULTRAM) 50 MG tablet Take 25 mg by mouth every 12 (twelve) hours as needed for moderate pain.    [provider]  traZODone (DESYREL) 50 MG tablet Take 25 mg by mouth See admin instructions. Takes 1/2 tab at 0800, 1200, then takes 2 tabs at bedtime 2000    [provider]  Vitamin D, Ergocalciferol, (DRISDOL) 50000 units CAPS capsule Take 50,000 Units by mouth every Thursday.     [provider]    Family History Family History  Problem  Relation Age of Onset  . Coronary artery disease Maternal Grandmother   . Diabetes type II Maternal Grandmother   . Cancer Maternal Grandmother   . Hypertension Mother   . Hypertension Father     Social History Social History  Substance Use Topics  . Smoking status: Never Smoker  . Smokeless tobacco: Never Used  . Alcohol use No     Allergies   Fish-derived products; Geodon [ziprasidone hcl]; Haldol [haloperidol lactate]; Buprenorphine hcl; Compazine [prochlorperazine]; Morphine and related; and Toradol [ketorolac tromethamine]   Review of Systems Review of Systems  Constitutional: Positive for activity change.  Respiratory: Negative for shortness of breath.   Cardiovascular: Negative for chest pain.  Gastrointestinal: Positive for abdominal pain, nausea and vomiting.  Allergic/Immunologic: Negative for immunocompromised state.  All other systems reviewed and are negative.    Physical Exam Updated Vital Signs BP 139/77   Pulse 90   Temp 97.9 F (36.6 C) (Oral)   Resp 18   Ht 5\' 4"  (1.626 m)   Wt 111.1 kg (245 lb)   LMP 10/10/2016 (Approximate) Comment: negative urine pregnancy test 10/26/16  SpO2 91%   BMI 42.05 kg/m   Physical Exam  Constitutional: She is oriented to person, place, and time. She appears well-developed.  HENT:  Head: Normocephalic and atraumatic.  Eyes: EOM are normal.   Neck: Normal range of motion. Neck supple.  Cardiovascular: Normal rate.   Pulmonary/Chest: Effort normal.  Abdominal: Bowel sounds are normal. There is tenderness. There is guarding.  RLQ tenderness  Neurological: She is alert and oriented to person, place, and time.  Skin: Skin is warm and dry.  Nursing note and vitals reviewed.    ED Treatments / Results  Labs (all labs ordered are listed, but only abnormal results are displayed) Labs Reviewed  COMPREHENSIVE METABOLIC PANEL - Abnormal; Notable for the following:       Result Value   CO2 21 (*)    Glucose, Bld 342 (*)    AST 48 (*)    All other components within normal limits  CBC - Abnormal; Notable for the following:    Hemoglobin 10.9 (*)    HCT 34.6 (*)    MCH 25.2 (*)    Platelets 128 (*)    All other components within normal limits  URINALYSIS, ROUTINE W REFLEX MICROSCOPIC - Abnormal; Notable for the following:    APPearance CLOUDY (*)    Specific Gravity, Urine 1.036 (*)    Glucose, UA >=500 (*)    Protein, ur 30 (*)    Leukocytes, UA LARGE (*)    Bacteria, UA RARE (*)    Squamous Epithelial / LPF 6-30 (*)    All other components within normal limits  LIPASE, BLOOD  PREGNANCY, URINE  POC URINE PREG, ED    EKG  EKG Interpretation None       Radiology US Pelvis (transabdominal Only)  Result Date: 10/26/2016 CLINICAL DATA:  Pelvic pain. History of polycystic ovarian syndrome. EXAM: TRANSABDOMINAL ULTRASOUND OF PELVIS DOPPLER ULTRASOUND OF OVARIES TECHNIQUE: Transabdominal ultrasound examinations of the pelvis were performed. Transabdominal technique was performed for global imaging of the pelvis including uterus, ovaries, adnexal regions, and pelvic cul-de-sac. Color and duplex Doppler ultrasound was utilized to evaluate blood flow to the ovaries. Transvaginal imaging not performed as patient is not sexually active. COMPARISON:  CT abdomen and pelvis October 16, 2016. FINDINGS: Habitus limited examination.  Uterus Not sonographically identified. Endometrium Not sonographically identified. Right ovary Not sonographically identified. Left ovary  Not sonographically identified. Pulsed Doppler evaluation of pelvic mass shows no vascularity. Other findings 7 x 7.1 x 6.3 cm mass seen in the pelvis. IMPRESSION: 1. Habitus limited examination. Uterus and adnexa not sonographically identified. 2. Apparent pelvic mass, however on recent CT there is no corresponding abnormality suggesting artifact. Electronically Signed   By: Elon Alas M.D.   On: 10/26/2016 05:56   Ct Abdomen Pelvis W Contrast  Result Date: 10/26/2016 CLINICAL DATA:  Right lower quadrant pain, history of polycystic ovarian syndrome, prior cholecystectomy EXAM: CT ABDOMEN AND PELVIS WITH CONTRAST TECHNIQUE: Multidetector CT imaging of the abdomen and pelvis was performed using the standard protocol following bolus administration of intravenous contrast. CONTRAST:  100 mL Isovue 300 IV COMPARISON:  10/16/2016 FINDINGS: Lower chest: Lung bases are essentially clear. Hepatobiliary: Hepatic steatosis with areas of focal fatty sparing. Status post cholecystectomy. No intrahepatic or extrahepatic ductal dilatation. Pancreas: Within normal limits. Spleen: Enlarged, measuring 21.6 cm in maximal craniocaudal dimension, grossly unchanged. Adrenals/Urinary Tract: Adrenal glands are within normal limits. Kidneys are within normal limits.  No hydronephrosis. Bladder is within normal limits. Stomach/Bowel: Stomach is within normal limits. No evidence of bowel obstruction. Normal appendix (series 2/image 73). Vascular/Lymphatic: No evidence of abdominal aortic aneurysm. No suspicious abdominopelvic lymphadenopathy. Reproductive: Uterus is within normal limits. Bilateral ovaries are unremarkable. Other: No abdominopelvic ascites. Postsurgical changes along the midline anterior abdominal wall. Musculoskeletal: Visualized osseous structures are within normal limits.  IMPRESSION: No evidence of bowel obstruction.  Normal appendix. Stable splenomegaly. No CT findings to account for the patient's right lower quadrant abdominal pain. Electronically Signed   By: Julian Hy M.D.   On: 10/26/2016 07:09   US Pelvic Doppler (torsion R/o Or Mass Arterial Flow)  Result Date: 10/26/2016 CLINICAL DATA:  Pelvic pain. History of polycystic ovarian syndrome. EXAM: TRANSABDOMINAL ULTRASOUND OF PELVIS DOPPLER ULTRASOUND OF OVARIES TECHNIQUE: Transabdominal ultrasound examinations of the pelvis were performed. Transabdominal technique was performed for global imaging of the pelvis including uterus, ovaries, adnexal regions, and pelvic cul-de-sac. Color and duplex Doppler ultrasound was utilized to evaluate blood flow to the ovaries. Transvaginal imaging not performed as patient is not sexually active. COMPARISON:  CT abdomen and pelvis October 16, 2016. FINDINGS: Habitus limited examination. Uterus Not sonographically identified. Endometrium Not sonographically identified. Right ovary Not sonographically identified. Left ovary Not sonographically identified. Pulsed Doppler evaluation of pelvic mass shows no vascularity. Other findings 7 x 7.1 x 6.3 cm mass seen in the pelvis. IMPRESSION: 1. Habitus limited examination. Uterus and adnexa not sonographically identified. 2. Apparent pelvic mass, however on recent CT there is no corresponding abnormality suggesting artifact. Electronically Signed   By: Elon Alas M.D.   On: 10/26/2016 05:56   Dg Abd Acute W/chest  Result Date: 10/26/2016 CLINICAL DATA:  Abdominal pain and hyperglycemia. EXAM: DG ABDOMEN ACUTE W/ 1V CHEST COMPARISON:  None. FINDINGS: Please note, on initial upright abdominal radiograph, laterality was mislabeled. Cardiomediastinal silhouette is normal. Lungs are clear, no pleural effusions. No pneumothorax. Soft tissue planes and included osseous structures are unremarkable. Bowel gas pattern is nondilated and  nonobstructive. Surgical clips in the included right abdomen compatible with cholecystectomy. No intra-abdominal mass effect, pathologic calcifications or free air. Soft tissue planes and included osseous structures are non-suspicious. Surgical clips project in the LEFT inguinal soft tissues. IMPRESSION: Negative abdominal radiographs.  No acute cardiopulmonary disease. Electronically Signed   By: Elon Alas M.D.   On: 10/26/2016 05:39    Procedures Procedures (including critical care time)  Medications Ordered in ED Medications  HYDROmorphone (DILAUDID) injection 1 mg (1 mg Intravenous Given 10/26/16 0632)  iopamidol (ISOVUE-300) 61 % injection 100 mL (100 mLs Intravenous Contrast Given 10/26/16 0653)     Initial Impression / Assessment and Plan / ED Course  I have reviewed the triage vital signs and the nursing notes.  Pertinent labs & imaging results that were available during my care of the patient were reviewed by me and considered in my medical decision making (see chart for details).  Clinical Course as of Oct 27 826  Thu Oct 26, 2016  0352 CO2: (!) 21 [SF]  0826 PAIN CONTINUES. Results from the ER workup discussed with the patient face to face and all questions answered to the best of my ability.  Risk and benefits of CT scan discussed with patient. Patient explained that she is oriented 3 CT scans to severe and with time she is continued. Increased risk of having radiation with cancer. Patient has agreed to proceed with CAT scan because of her severe abdominal pain. US PELVIS (TRANSABDOMINAL ONLY) [AN]  0827 Results from the ER workup discussed with the patient face to face and all questions answered to the best of my ability.   CT scan shows negative appendicitis. Ovaries looked unremarkable. Strict return precautions discussed and patient advised to follow-up with herprimary care doctor and gynecologist. CT Abdomen Pelvis W Contrast [AN]    Clinical Course User  Index [AN] Varney Biles, MD [SF] Beatriz Stallion, [place]    Patient comes in with chief complaint of right lower quadrant abdominal pain. Patient has history of polycystic ovarian syndrome and reports different type of pain with her PCOS. Patient has associated nausea and vomiting and reports that the abdominal pain has gotten worse since the onset. Patient has had 3 CT scans this year, all unremarkable. On my exam patient does have focal right lower quadrant tenderness with guarding. My suspicion was high for possibly ovarian torsion or large cyst this restarted with ultrasound pelvis. Unfortunately the RESULTS WERE EQUIVOCAL on ultrasound.   Final Clinical Impressions(s) / ED Diagnoses   Final diagnoses:  Pelvic pain  Right lower quadrant abdominal pain    New Prescriptions New Prescriptions   ACETAMINOPHEN (TYLENOL 8 HOUR) 650 MG CR TABLET    Take 1 tablet (650 mg total) by mouth every 8 (eight) hours as needed for pain.   IBUPROFEN (ADVIL,MOTRIN) 400 MG TABLET    Take 1 tablet (400 mg total) by mouth every 6 (six) hours as needed.     Varney Biles, MD 10/26/16 [phone]

## 2016-10-26 NOTE — ED Triage Notes (Signed)
Pt presents via EMS from home with c/o abdominal pain and hyperglycemia. Pt reported she is experiencing right lower quadrant pain that started at 10 am yesterday. Pt reports diarrhea and vomiting. Pt also has a CBG of 332 en route taken by EMS. Pt was given 4 mg of zofran en route.

## 2016-10-26 NOTE — Progress Notes (Addendum)
Inpatient Diabetes Program Recommendations  AACE/ADA: New Consensus Statement on Inpatient Glycemic Control (2015)  Target Ranges:  Prepandial:   less than 140 mg/dL      Peak postprandial:   less than 180 mg/dL (1-2 hours)      Critically ill patients:  140 - 180 mg/dL   Results for PARISS, HOMMES (MRN [id number]) as of 10/26/2016 08:53  Ref. Range 10/26/2016 02:51  Glucose Latest Ref Range: 65 - 99 mg/dL 342 (H)   Review of Glycemic Control  Diabetes history: DM 2 Outpatient Diabetes medications: Levemir 57 units BID, Novolog 22 units tid with meals Current orders for Inpatient glycemic control: None waiting in ED  Inpatient Diabetes Program Recommendations:    Glucose in the 300's.  Could consider a portion of patient's Levemir dose, 20 units BID Could consider Novolog Moderate Correction scale 0-15 units Q4 hours.  Thanks,  Tama Headings RN, MSN, [facility] Inpatient Diabetes Coordinator Team Pager [phone] (8a-5p)

## 2016-10-26 NOTE — ED Notes (Signed)
Bed: BT24 Expected date:  Expected time:  Means of arrival:  Comments: rm 4

## 2016-10-27 DIAGNOSIS — B373 Candidiasis of vulva and vagina: Secondary | ICD-10-CM | POA: Diagnosis not present

## 2016-10-27 DIAGNOSIS — F319 Bipolar disorder, unspecified: Secondary | ICD-10-CM | POA: Diagnosis not present

## 2016-10-27 DIAGNOSIS — F250 Schizoaffective disorder, bipolar type: Secondary | ICD-10-CM | POA: Diagnosis not present

## 2016-10-27 DIAGNOSIS — E1165 Type 2 diabetes mellitus with hyperglycemia: Secondary | ICD-10-CM | POA: Diagnosis not present

## 2016-10-27 DIAGNOSIS — I10 Essential (primary) hypertension: Secondary | ICD-10-CM | POA: Diagnosis not present

## 2016-10-27 DIAGNOSIS — M797 Fibromyalgia: Secondary | ICD-10-CM | POA: Diagnosis not present

## 2016-10-31 ENCOUNTER — Encounter (HOSPITAL_COMMUNITY): Payer: Self-pay | Admitting: Emergency Medicine

## 2016-10-31 ENCOUNTER — Emergency Department (HOSPITAL_COMMUNITY)
Admission: EM | Admit: 2016-10-31 | Discharge: 2016-11-02 | Disposition: A | Discharge status: Other Acute Inpatient Hospital | Payer: Medicare HMO | Attending: Emergency Medicine | Admitting: Emergency Medicine

## 2016-10-31 DIAGNOSIS — R440 Auditory hallucinations: Secondary | ICD-10-CM | POA: Diagnosis not present

## 2016-10-31 DIAGNOSIS — Z79899 Other long term (current) drug therapy: Secondary | ICD-10-CM | POA: Insufficient documentation

## 2016-10-31 DIAGNOSIS — I10 Essential (primary) hypertension: Secondary | ICD-10-CM | POA: Diagnosis not present

## 2016-10-31 DIAGNOSIS — E1165 Type 2 diabetes mellitus with hyperglycemia: Secondary | ICD-10-CM | POA: Diagnosis not present

## 2016-10-31 DIAGNOSIS — R079 Chest pain, unspecified: Secondary | ICD-10-CM | POA: Insufficient documentation

## 2016-10-31 DIAGNOSIS — F419 Anxiety disorder, unspecified: Secondary | ICD-10-CM | POA: Diagnosis not present

## 2016-10-31 DIAGNOSIS — R000 Tachycardia, unspecified: Secondary | ICD-10-CM | POA: Diagnosis not present

## 2016-10-31 DIAGNOSIS — R739 Hyperglycemia, unspecified: Secondary | ICD-10-CM

## 2016-10-31 DIAGNOSIS — R45851 Suicidal ideations: Secondary | ICD-10-CM | POA: Insufficient documentation

## 2016-10-31 DIAGNOSIS — Z794 Long term (current) use of insulin: Secondary | ICD-10-CM | POA: Diagnosis not present

## 2016-10-31 DIAGNOSIS — Z9114 Patient's other noncompliance with medication regimen: Secondary | ICD-10-CM | POA: Insufficient documentation

## 2016-10-31 DIAGNOSIS — R441 Visual hallucinations: Secondary | ICD-10-CM | POA: Insufficient documentation

## 2016-10-31 DIAGNOSIS — F329 Major depressive disorder, single episode, unspecified: Secondary | ICD-10-CM | POA: Diagnosis present

## 2016-10-31 DIAGNOSIS — Z046 Encounter for general psychiatric examination, requested by authority: Secondary | ICD-10-CM | POA: Diagnosis not present

## 2016-10-31 DIAGNOSIS — F258 Other schizoaffective disorders: Secondary | ICD-10-CM | POA: Diagnosis not present

## 2016-10-31 DIAGNOSIS — F250 Schizoaffective disorder, bipolar type: Secondary | ICD-10-CM | POA: Diagnosis not present

## 2016-10-31 DIAGNOSIS — C494 Malignant neoplasm of connective and soft tissue of abdomen: Secondary | ICD-10-CM | POA: Diagnosis not present

## 2016-10-31 LAB — COMPREHENSIVE METABOLIC PANEL
ALBUMIN: 4.3 g/dL (ref 3.5–5.0)
ALK PHOS: 107 U/L (ref 38–126)
ALT: 28 U/L (ref 14–54)
ANION GAP: 13 (ref 5–15)
AST: 44 U/L — ABNORMAL HIGH (ref 15–41)
BILIRUBIN TOTAL: 0.5 mg/dL (ref 0.3–1.2)
BUN: 8 mg/dL (ref 6–20)
CALCIUM: 9.7 mg/dL (ref 8.9–10.3)
CO2: 18 mmol/L — ABNORMAL LOW (ref 22–32)
Chloride: 103 mmol/L (ref 101–111)
Creatinine, Ser: 0.53 mg/dL (ref 0.44–1.00)
GFR calc Af Amer: >60 mL/min (ref >60)
GLUCOSE: 475 mg/dL — AB (ref 65–99)
Potassium: 4.1 mmol/L (ref 3.5–5.1)
Sodium: 134 mmol/L — ABNORMAL LOW (ref 135–145)
TOTAL PROTEIN: 8.3 g/dL — AB (ref 6.5–8.1)

## 2016-10-31 LAB — CBC
HEMATOCRIT: 37.5 % (ref 36.0–46.0)
Hemoglobin: 12.2 g/dL (ref 12.0–15.0)
MCH: 26.5 pg (ref 26.0–34.0)
MCHC: 32.5 g/dL (ref 30.0–36.0)
MCV: 81.5 fL (ref 78.0–100.0)
Platelets: 142 10*3/uL — ABNORMAL LOW (ref 150–400)
RBC: 4.6 MIL/uL (ref 3.87–5.11)
RDW: 14.9 % (ref 11.5–15.5)
WBC: 5.5 10*3/uL (ref 4.0–10.5)

## 2016-10-31 LAB — I-STAT BETA HCG BLOOD, ED (MC, WL, AP ONLY): I-stat hCG, quantitative: <5 m[IU]/mL (ref <5)

## 2016-10-31 LAB — ETHANOL: Alcohol, Ethyl (B): <10 mg/dL (ref <10)

## 2016-10-31 LAB — CBG MONITORING, ED
GLUCOSE-CAPILLARY: 429 mg/dL — AB (ref 65–99)
Glucose-Capillary: 435 mg/dL — ABNORMAL HIGH (ref 65–99)

## 2016-10-31 LAB — RAPID URINE DRUG SCREEN, HOSP PERFORMED
AMPHETAMINES: NOT DETECTED
BARBITURATES: NOT DETECTED
BENZODIAZEPINES: NOT DETECTED
COCAINE: NOT DETECTED
Opiates: NOT DETECTED
Tetrahydrocannabinol: NOT DETECTED

## 2016-10-31 LAB — ACETAMINOPHEN LEVEL

## 2016-10-31 LAB — SALICYLATE LEVEL: Salicylate Lvl: <7 mg/dL (ref 2.8–30.0)

## 2016-10-31 MED ORDER — SODIUM CHLORIDE 0.9 % IV BOLUS (SEPSIS)
1000.0000 mL | Freq: Once | INTRAVENOUS | Status: AC
  Administered 2016-11-01: 1000 mL via INTRAVENOUS

## 2016-10-31 MED ORDER — INSULIN ASPART 100 UNIT/ML ~~LOC~~ SOLN
22.0000 [IU] | Freq: Three times a day (TID) | SUBCUTANEOUS | Status: DC
  Administered 2016-11-01 (×3): 22 [IU] via SUBCUTANEOUS
  Filled 2016-10-31 (×3): qty 1

## 2016-10-31 MED ORDER — CHLORPROMAZINE HCL 25 MG PO TABS
50.0000 mg | ORAL_TABLET | Freq: Three times a day (TID) | ORAL | Status: DC
  Administered 2016-11-01 (×4): 50 mg via ORAL
  Filled 2016-10-31 (×4): qty 2

## 2016-10-31 MED ORDER — CLONIDINE HCL 0.1 MG PO TABS
0.1000 mg | ORAL_TABLET | Freq: Two times a day (BID) | ORAL | Status: DC
  Administered 2016-11-01 (×3): 0.1 mg via ORAL
  Filled 2016-10-31 (×3): qty 1

## 2016-10-31 MED ORDER — TRAZODONE HCL 50 MG PO TABS
50.0000 mg | ORAL_TABLET | Freq: Every day | ORAL | Status: DC
  Administered 2016-11-01 (×2): 50 mg via ORAL
  Filled 2016-10-31 (×2): qty 1

## 2016-10-31 MED ORDER — LISINOPRIL 10 MG PO TABS
10.0000 mg | ORAL_TABLET | Freq: Every day | ORAL | Status: DC
  Administered 2016-11-01: 10 mg via ORAL
  Filled 2016-10-31: qty 1

## 2016-10-31 MED ORDER — VITAMIN D (ERGOCALCIFEROL) 1.25 MG (50000 UNIT) PO CAPS: 50000.0000 [IU] | ORAL_CAPSULE | ORAL | Status: DC

## 2016-10-31 MED ORDER — SIMVASTATIN 10 MG PO TABS
10.0000 mg | ORAL_TABLET | Freq: Every day | ORAL | Status: DC
  Administered 2016-11-01 (×2): 10 mg via ORAL
  Filled 2016-10-31 (×2): qty 1

## 2016-10-31 MED ORDER — INSULIN DETEMIR 100 UNIT/ML ~~LOC~~ SOLN
68.0000 [IU] | Freq: Two times a day (BID) | SUBCUTANEOUS | Status: DC
  Administered 2016-11-01 (×3): 68 [IU] via SUBCUTANEOUS
  Filled 2016-10-31 (×3): qty 0.68

## 2016-10-31 MED ORDER — INSULIN ASPART 100 UNIT/ML ~~LOC~~ SOLN
5.0000 [IU] | Freq: Once | SUBCUTANEOUS | Status: AC
  Administered 2016-11-01: 5 [IU] via INTRAVENOUS
  Filled 2016-10-31: qty 1

## 2016-10-31 MED ORDER — GABAPENTIN 400 MG PO CAPS
400.0000 mg | ORAL_CAPSULE | Freq: Three times a day (TID) | ORAL | Status: DC
  Administered 2016-11-01 (×4): 400 mg via ORAL
  Filled 2016-10-31 (×4): qty 1

## 2016-10-31 MED ORDER — TOPIRAMATE 100 MG PO TABS
100.0000 mg | ORAL_TABLET | Freq: Two times a day (BID) | ORAL | Status: DC
  Administered 2016-11-01 (×3): 100 mg via ORAL
  Filled 2016-10-31 (×3): qty 1

## 2016-10-31 MED ORDER — QUETIAPINE FUMARATE 100 MG PO TABS
100.0000 mg | ORAL_TABLET | Freq: Every morning | ORAL | Status: DC
  Administered 2016-11-01: 100 mg via ORAL
  Filled 2016-10-31: qty 1

## 2016-10-31 MED ORDER — SERTRALINE HCL 50 MG PO TABS
200.0000 mg | ORAL_TABLET | ORAL | Status: DC
  Administered 2016-11-01: 200 mg via ORAL
  Filled 2016-10-31: qty 4

## 2016-10-31 MED ORDER — LORAZEPAM 0.5 MG PO TABS
0.5000 mg | ORAL_TABLET | Freq: Every day | ORAL | Status: DC
  Administered 2016-11-01: 0.5 mg via ORAL
  Filled 2016-10-31 (×2): qty 1

## 2016-10-31 MED ORDER — QUETIAPINE FUMARATE 300 MG PO TABS
800.0000 mg | ORAL_TABLET | Freq: Every day | ORAL | Status: DC
  Administered 2016-11-01 (×2): 800 mg via ORAL
  Filled 2016-10-31 (×2): qty 2

## 2016-10-31 MED ORDER — FENOFIBRATE 160 MG PO TABS
160.0000 mg | ORAL_TABLET | Freq: Every day | ORAL | Status: DC
  Administered 2016-11-01: 160 mg via ORAL
  Filled 2016-10-31: qty 1

## 2016-10-31 MED ORDER — LACTULOSE 10 GM/15ML PO SOLN
20.0000 g | Freq: Two times a day (BID) | ORAL | Status: DC
  Administered 2016-11-01 (×3): 20 g via ORAL
  Filled 2016-10-31 (×3): qty 30

## 2016-10-31 MED ORDER — BUSPIRONE HCL 10 MG PO TABS
15.0000 mg | ORAL_TABLET | Freq: Three times a day (TID) | ORAL | Status: DC
  Administered 2016-11-01 (×4): 15 mg via ORAL
  Filled 2016-10-31 (×4): qty 2
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
## 2016-10-31 NOTE — ED Notes (Signed)
Bed: WA17 Expected date:  Expected time:  Means of arrival:  Comments: Hold for 26

## 2016-10-31 NOTE — ED Triage Notes (Addendum)
Patient states she wants to kill herself. She states she wanted to take all her medication in her cabinet. Patient has not been taking her prescribe medication like she should.
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
## 2016-11-01 DIAGNOSIS — F250 Schizoaffective disorder, bipolar type: Secondary | ICD-10-CM | POA: Diagnosis not present

## 2016-11-01 LAB — CBG MONITORING, ED
GLUCOSE-CAPILLARY: 304 mg/dL — AB (ref 65–99)
GLUCOSE-CAPILLARY: 320 mg/dL — AB (ref 65–99)
GLUCOSE-CAPILLARY: 270 mg/dL — AB (ref 65–99)
GLUCOSE-CAPILLARY: 268 mg/dL — AB (ref 65–99)
Glucose-Capillary: 278 mg/dL — ABNORMAL HIGH (ref 65–99)
Glucose-Capillary: 303 mg/dL — ABNORMAL HIGH (ref 65–99)
Glucose-Capillary: 344 mg/dL — ABNORMAL HIGH (ref 65–99)
Glucose-Capillary: 356 mg/dL — ABNORMAL HIGH (ref 65–99)
Glucose-Capillary: 371 mg/dL — ABNORMAL HIGH (ref 65–99)

## 2016-11-01 LAB — URINALYSIS, ROUTINE W REFLEX MICROSCOPIC
BILIRUBIN URINE: NEGATIVE
KETONES UR: NEGATIVE mg/dL
Leukocytes, UA: NEGATIVE
NITRITE: NEGATIVE
PH: 5.5 (ref 5.0–8.0)
Protein, ur: NEGATIVE mg/dL
Specific Gravity, Urine: 1.015 (ref 1.005–1.030)

## 2016-11-01 LAB — URINALYSIS, MICROSCOPIC (REFLEX)

## 2016-11-01 MED ORDER — INSULIN ASPART 100 UNIT/ML ~~LOC~~ SOLN: 10.0000 [IU] | Freq: Once | SUBCUTANEOUS | Status: DC

## 2016-11-01 MED ORDER — LORAZEPAM 2 MG/ML IJ SOLN
2.0000 mg | Freq: Once | INTRAMUSCULAR | Status: AC
  Administered 2016-11-01: 2 mg via INTRAMUSCULAR
  Filled 2016-11-01: qty 1

## 2016-11-01 MED ORDER — INSULIN ASPART 100 UNIT/ML ~~LOC~~ SOLN
5.0000 [IU] | Freq: Once | SUBCUTANEOUS | Status: AC
  Administered 2016-11-01: 5 [IU] via INTRAVENOUS
  Filled 2016-11-01: qty 1

## 2016-11-01 MED ORDER — SODIUM CHLORIDE 0.9 % IV BOLUS (SEPSIS)
1000.0000 mL | Freq: Once | INTRAVENOUS | Status: AC
  Administered 2016-11-01: 1000 mL via INTRAVENOUS

## 2016-11-01 NOTE — ED Notes (Signed)
\  [id number]\ 

## 2016-11-01 NOTE — ED Notes (Signed)
Patient walked to bathroom with sitter but was unaware a urine specimen was needed. Patient is now aware a urine specimen is needed.

## 2016-11-01 NOTE — ED Notes (Signed)
Pt with c/o of "heart racing and chest pain". Pt up in room and wandering around.  NP made aware. Order given to perform EKG. EKG done. Result reported to NP. Order given for 2 mg of lorazepam IM. Med given. Pt encouraged to lie down and get some rest. Will continue to monitor pt.

## 2016-11-01 NOTE — BH Assessment (Signed)
[facility] Assessment Progress Note  Per Corena Pilgrim, MD, this pt requires psychiatric hospitalization.  Leonia Reader, RN, [facility] has assigned pt to [facility] Rm [id number]; they will call when they are ready to receive pt.  Pt presents under IVC initiated by EDP Pryor Curia, MD, and IVC documents have been faxed to [facility].  Pt's nurse has been notified, and agrees to call report to [phone].  Pt is to be transported via Event organiser.   Jalene Mullet, [place] Triage Specialist [phone]

## 2016-11-01 NOTE — ED Notes (Signed)
Bed: WA26 Expected date:  Expected time:  Means of arrival:  Comments: 

## 2016-11-01 NOTE — BHH Counselor (Signed)
Pt was IVC'd by EDP. Per IVC paperwork: "Patient has suicidal thoughts with plan to take all her medications. To kill herself. Refusing admission to behavioral health hospital. Patient has poor insight and is a danger to herself and would benefit for inpatient treatment."   Vertell Novak, MS, [facility], [facility] Triage Specialist [phone]

## 2016-11-01 NOTE — ED Notes (Signed)
CBG 371. Call received from NP Ward to give 8:00 AM dose of 22 units of insulin now. Drug given.
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
## 2016-11-02 ENCOUNTER — Encounter (HOSPITAL_COMMUNITY): Payer: Self-pay | Admitting: *Deleted

## 2016-11-02 ENCOUNTER — Inpatient Hospital Stay (HOSPITAL_COMMUNITY)
Admission: AD | Admit: 2016-11-02 | Discharge: 2016-11-04 | DRG: 883 | Disposition: A | Discharge status: Home / Self Care | Payer: Medicare HMO | Attending: Psychiatry | Admitting: Psychiatry

## 2016-11-02 DIAGNOSIS — Z62810 Personal history of physical and sexual abuse in childhood: Secondary | ICD-10-CM | POA: Diagnosis present

## 2016-11-02 DIAGNOSIS — F419 Anxiety disorder, unspecified: Secondary | ICD-10-CM | POA: Diagnosis present

## 2016-11-02 DIAGNOSIS — R5383 Other fatigue: Secondary | ICD-10-CM

## 2016-11-02 DIAGNOSIS — I10 Essential (primary) hypertension: Secondary | ICD-10-CM | POA: Diagnosis not present

## 2016-11-02 DIAGNOSIS — Z794 Long term (current) use of insulin: Secondary | ICD-10-CM

## 2016-11-02 DIAGNOSIS — E669 Obesity, unspecified: Secondary | ICD-10-CM | POA: Diagnosis not present

## 2016-11-02 DIAGNOSIS — G4700 Insomnia, unspecified: Secondary | ICD-10-CM

## 2016-11-02 DIAGNOSIS — R4582 Worries: Secondary | ICD-10-CM | POA: Diagnosis not present

## 2016-11-02 DIAGNOSIS — R443 Hallucinations, unspecified: Secondary | ICD-10-CM | POA: Diagnosis not present

## 2016-11-02 DIAGNOSIS — R450 Nervousness: Secondary | ICD-10-CM | POA: Diagnosis not present

## 2016-11-02 DIAGNOSIS — R4586 Emotional lability: Secondary | ICD-10-CM | POA: Diagnosis not present

## 2016-11-02 DIAGNOSIS — E1065 Type 1 diabetes mellitus with hyperglycemia: Secondary | ICD-10-CM | POA: Diagnosis present

## 2016-11-02 DIAGNOSIS — F39 Unspecified mood [affective] disorder: Secondary | ICD-10-CM | POA: Diagnosis not present

## 2016-11-02 DIAGNOSIS — R441 Visual hallucinations: Secondary | ICD-10-CM | POA: Diagnosis not present

## 2016-11-02 DIAGNOSIS — R440 Auditory hallucinations: Secondary | ICD-10-CM

## 2016-11-02 DIAGNOSIS — Z888 Allergy status to other drugs, medicaments and biological substances status: Secondary | ICD-10-CM | POA: Diagnosis not present

## 2016-11-02 DIAGNOSIS — F250 Schizoaffective disorder, bipolar type: Secondary | ICD-10-CM | POA: Diagnosis not present

## 2016-11-02 DIAGNOSIS — R45851 Suicidal ideations: Secondary | ICD-10-CM | POA: Diagnosis not present

## 2016-11-02 DIAGNOSIS — Z885 Allergy status to narcotic agent status: Secondary | ICD-10-CM | POA: Diagnosis not present

## 2016-11-02 DIAGNOSIS — F4310 Post-traumatic stress disorder, unspecified: Secondary | ICD-10-CM | POA: Diagnosis present

## 2016-11-02 DIAGNOSIS — E785 Hyperlipidemia, unspecified: Secondary | ICD-10-CM | POA: Diagnosis not present

## 2016-11-02 DIAGNOSIS — Z8589 Personal history of malignant neoplasm of other organs and systems: Secondary | ICD-10-CM

## 2016-11-02 DIAGNOSIS — F603 Borderline personality disorder: Secondary | ICD-10-CM | POA: Diagnosis present

## 2016-11-02 DIAGNOSIS — R079 Chest pain, unspecified: Secondary | ICD-10-CM | POA: Diagnosis not present

## 2016-11-02 DIAGNOSIS — G4489 Other headache syndrome: Secondary | ICD-10-CM | POA: Diagnosis not present

## 2016-11-02 DIAGNOSIS — Z6841 Body Mass Index (BMI) 40.0 and over, adult: Secondary | ICD-10-CM

## 2016-11-02 DIAGNOSIS — Z79899 Other long term (current) drug therapy: Secondary | ICD-10-CM

## 2016-11-02 DIAGNOSIS — E1165 Type 2 diabetes mellitus with hyperglycemia: Secondary | ICD-10-CM | POA: Diagnosis not present

## 2016-11-02 DIAGNOSIS — R5381 Other malaise: Secondary | ICD-10-CM

## 2016-11-02 DIAGNOSIS — Z915 Personal history of self-harm: Secondary | ICD-10-CM

## 2016-11-02 DIAGNOSIS — R000 Tachycardia, unspecified: Secondary | ICD-10-CM | POA: Diagnosis not present

## 2016-11-02 DIAGNOSIS — C494 Malignant neoplasm of connective and soft tissue of abdomen: Secondary | ICD-10-CM | POA: Diagnosis not present

## 2016-11-02 DIAGNOSIS — F29 Unspecified psychosis not due to a substance or known physiological condition: Secondary | ICD-10-CM | POA: Diagnosis not present

## 2016-11-02 DIAGNOSIS — IMO0002 Reserved for concepts with insufficient information to code with codable children: Secondary | ICD-10-CM

## 2016-11-02 DIAGNOSIS — F6089 Other specific personality disorders: Secondary | ICD-10-CM | POA: Diagnosis not present

## 2016-11-02 DIAGNOSIS — F609 Personality disorder, unspecified: Secondary | ICD-10-CM | POA: Diagnosis not present

## 2016-11-02 LAB — GLUCOSE, CAPILLARY
GLUCOSE-CAPILLARY: 349 mg/dL — AB (ref 65–99)
GLUCOSE-CAPILLARY: 454 mg/dL — AB (ref 65–99)
Glucose-Capillary: 337 mg/dL — ABNORMAL HIGH (ref 65–99)
Glucose-Capillary: 463 mg/dL — ABNORMAL HIGH (ref 65–99)
Glucose-Capillary: 405 mg/dL — ABNORMAL HIGH (ref 65–99)
Glucose-Capillary: 341 mg/dL — ABNORMAL HIGH (ref 65–99)

## 2016-11-02 LAB — TSH: TSH: 1.589 u[IU]/mL (ref 0.350–4.500)

## 2016-11-02 LAB — CBG MONITORING, ED: Glucose-Capillary: 292 mg/dL — ABNORMAL HIGH (ref 65–99)

## 2016-11-02 MED ORDER — LACTULOSE 10 GM/15ML PO SOLN
20.0000 g | Freq: Two times a day (BID) | ORAL | Status: DC
  Administered 2016-11-02 – 2016-11-04 (×5): 20 g via ORAL
  Filled 2016-11-02 (×9): qty 30

## 2016-11-02 MED ORDER — QUETIAPINE FUMARATE 100 MG PO TABS
100.0000 mg | ORAL_TABLET | Freq: Every morning | ORAL | Status: DC
  Administered 2016-11-02 – 2016-11-03 (×2): 100 mg via ORAL
  Filled 2016-11-02 (×4): qty 1

## 2016-11-02 MED ORDER — TRAZODONE HCL 50 MG PO TABS
50.0000 mg | ORAL_TABLET | Freq: Every day | ORAL | Status: DC
  Administered 2016-11-02: 50 mg via ORAL
  Filled 2016-11-02 (×3): qty 1

## 2016-11-02 MED ORDER — ACETAMINOPHEN 325 MG PO TABS
650.0000 mg | ORAL_TABLET | Freq: Four times a day (QID) | ORAL | Status: DC | PRN
  Administered 2016-11-02: 650 mg via ORAL
  Filled 2016-11-02: qty 2

## 2016-11-02 MED ORDER — CLONIDINE HCL 0.1 MG PO TABS
0.1000 mg | ORAL_TABLET | Freq: Two times a day (BID) | ORAL | Status: DC
  Administered 2016-11-02 – 2016-11-04 (×5): 0.1 mg via ORAL
  Filled 2016-11-02 (×9): qty 1

## 2016-11-02 MED ORDER — SIMVASTATIN 10 MG PO TABS
10.0000 mg | ORAL_TABLET | Freq: Every day | ORAL | Status: DC
  Administered 2016-11-02 – 2016-11-03 (×2): 10 mg via ORAL
  Filled 2016-11-02 (×4): qty 1

## 2016-11-02 MED ORDER — CHLORPROMAZINE HCL 50 MG PO TABS
50.0000 mg | ORAL_TABLET | Freq: Three times a day (TID) | ORAL | Status: DC
  Administered 2016-11-02 – 2016-11-03 (×6): 50 mg via ORAL
  Filled 2016-11-02 (×10): qty 1

## 2016-11-02 MED ORDER — INSULIN ASPART 100 UNIT/ML ~~LOC~~ SOLN
22.0000 [IU] | Freq: Three times a day (TID) | SUBCUTANEOUS | Status: DC
  Administered 2016-11-02 – 2016-11-03 (×5): 22 [IU] via SUBCUTANEOUS

## 2016-11-02 MED ORDER — BUSPIRONE HCL 15 MG PO TABS
15.0000 mg | ORAL_TABLET | Freq: Three times a day (TID) | ORAL | Status: DC
  Administered 2016-11-02 – 2016-11-04 (×7): 15 mg via ORAL
  Filled 2016-11-02 (×13): qty 1

## 2016-11-02 MED ORDER — TOPIRAMATE 100 MG PO TABS
100.0000 mg | ORAL_TABLET | Freq: Two times a day (BID) | ORAL | Status: DC
  Administered 2016-11-02 – 2016-11-04 (×5): 100 mg via ORAL
  Filled 2016-11-02 (×9): qty 1

## 2016-11-02 MED ORDER — ALUM & MAG HYDROXIDE-SIMETH 200-200-20 MG/5ML PO SUSP: 30.0000 mL | ORAL | Status: DC | PRN

## 2016-11-02 MED ORDER — INSULIN DETEMIR 100 UNIT/ML ~~LOC~~ SOLN
68.0000 [IU] | Freq: Two times a day (BID) | SUBCUTANEOUS | Status: DC
  Administered 2016-11-02 – 2016-11-03 (×4): 68 [IU] via SUBCUTANEOUS

## 2016-11-02 MED ORDER — GABAPENTIN 400 MG PO CAPS
400.0000 mg | ORAL_CAPSULE | Freq: Three times a day (TID) | ORAL | Status: DC
  Administered 2016-11-02 – 2016-11-04 (×7): 400 mg via ORAL
  Filled 2016-11-02 (×13): qty 1

## 2016-11-02 MED ORDER — SERTRALINE HCL 100 MG PO TABS
200.0000 mg | ORAL_TABLET | ORAL | Status: DC
  Administered 2016-11-02 – 2016-11-04 (×3): 200 mg via ORAL
  Filled 2016-11-02 (×5): qty 2

## 2016-11-02 MED ORDER — FENOFIBRATE 160 MG PO TABS
160.0000 mg | ORAL_TABLET | Freq: Every day | ORAL | Status: DC
  Administered 2016-11-02 – 2016-11-04 (×3): 160 mg via ORAL
  Filled 2016-11-02 (×5): qty 1

## 2016-11-02 MED ORDER — QUETIAPINE FUMARATE 400 MG PO TABS
800.0000 mg | ORAL_TABLET | Freq: Every day | ORAL | Status: DC
  Administered 2016-11-02 – 2016-11-03 (×2): 800 mg via ORAL
  Filled 2016-11-02 (×4): qty 2

## 2016-11-02 MED ORDER — MAGNESIUM HYDROXIDE 400 MG/5ML PO SUSP
30.0000 mL | Freq: Every day | ORAL | Status: DC | PRN
Start: 2016-11-02 — End: 2016-11-04

## 2016-11-02 MED ORDER — LISINOPRIL 10 MG PO TABS
10.0000 mg | ORAL_TABLET | Freq: Every day | ORAL | Status: DC
  Administered 2016-11-02 – 2016-11-04 (×3): 10 mg via ORAL
  Filled 2016-11-02 (×5): qty 1

## 2016-11-02 MED ORDER — VITAMIN D (ERGOCALCIFEROL) 1.25 MG (50000 UNIT) PO CAPS
50000.0000 [IU] | ORAL_CAPSULE | ORAL | Status: DC
  Administered 2016-11-02: 50000 [IU] via ORAL
  Filled 2016-11-02: qty 1

## 2016-11-02 NOTE — Progress Notes (Signed)
DAR NOTE: Patient presents with flat affect and calm mood.  Pt has been in the day room most of the time, pt has not been interacting much with either staff or peer. Pt ate at the cafeteria without any problems. Reports poor sleep, good appetite, low energy, and poor concentration. Denies pain, auditory and visual hallucinations.  Rates depression at 3, hopelessness at 3, and anxiety at 4.  Maintained on routine safety checks.  Medications given as prescribed.  Support and encouragement offered as needed.  Attended group and participated.  States goal for today is " getting better."  Patient observed socializing with peers in the dayroom.  Offered no complaint.
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
## 2016-11-02 NOTE — Progress Notes (Signed)
Pt complained that she was not feeling well and had pain 8 / 10 on lower Lside abdomen. Pt was given tylenol pt Vital signs : T- 98.2 , O2- 97, RR-20 P-96, Bp 115/ 50

## 2016-11-02 NOTE — BHH Group Notes (Signed)
LCSW Group Therapy 11/02/2016 1:15pm  Type of Therapy and Topic:  Group Therapy:  Change and Accountability  Participation Level:  Did Not Attend  Description of Group In this group, patients discussed power and accountability for change.  The group identified the challenges related to accountability and the difficulty of accepting the outcomes of negative behaviors.  Patients were encouraged to openly discuss a challenge/change they could take responsibility for.  Patients discussed the use of "change talk" and positive thinking as ways to support achievement of personal goals.  The group discussed ways to give support and empowerment to peers.  Therapeutic Goals: 1. Patients will state the relationship between personal power and accountability in the change process 2. Patients will identify the positive and negative consequences of a personal choice they have made 3. Patients will identify one challenge/choice they will take responsibility for making 4. Patients will discuss the role of "change talk" and the impact of positive thinking as it supports successful personal change 5. Patients will verbalize support and affirmation of change efforts in peers  Summary of Patient Progress:    Therapeutic Modalities Solution Focused Brief Therapy Motivational Interviewing Cognitive Behavioral Therapy  Tina Saunders 11/02/2016 2:45 PM

## 2016-11-02 NOTE — Progress Notes (Signed)
Recreation Therapy Notes  INPATIENT RECREATION THERAPY ASSESSMENT  Patient Details Name: Tina Saunders MRN: [id number] DOB: [birth year] Today's Date: 11/02/2016  Patient Stressors: Family  Patient stated she was here for suicidal thoughts.  Coping Skills:   Isolate, Avoidance, Self-Injury, Art/Dance, Music  Personal Challenges: Relationships, Self-Esteem/Confidence, Social Interaction, Stress Management, Trusting Others  Leisure Interests (2+):  Individual - TV, Individual - Computer, Individual - Other (Comment) (Laying down)  Awareness of Community Resources:  No  Patient Strengths:  "I don't have any"  Patient Identified Areas of Improvement:  "I don't know"  Current Recreation Participation:  Everyday  Patient Goal for Hospitalization:  "Get better"  [place] of Residence:  [place] of Residence:  [place]  Current [place] (including self-harm):  No  Current [place]:  No  Consent to Intern Participation: N/A   Victorino Sparrow, LRT/CTRS  Victorino Sparrow A 11/02/2016, 2:40 PM

## 2016-11-02 NOTE — Progress Notes (Signed)
Pt was pleasant, but flat and depressed during the adm process. When asked the circumstances surrounding her adm pt stated, "because I wanted to kill myself". Pt admits to a/h telling her to "just go ahead and do it". However, pt denied si at the time of adm. States she was home and has been having suicidal for several days but seem to increase on Tues. Stated she called the pd and was taken to the hosp.

## 2016-11-02 NOTE — Progress Notes (Signed)
D: Pt denies SI/HI/AVH. Pt presents somatic at times, appears histrionic but her complaints do not match her mood/ responses.    A: Pt was offered support and encouragement. Pt was given scheduled medications. Pt was encourage to attend groups. Q 15 minute checks were done for safety.   R: Pt is taking medication. Pt has no complaints.Pt receptive to treatment and safety maintained on unit.

## 2016-11-02 NOTE — Progress Notes (Signed)
Did not attend group 

## 2016-11-02 NOTE — BHH Counselor (Addendum)
Patient has a Careers information officer, Ragan Carthins, [phone]

## 2016-11-02 NOTE — Progress Notes (Signed)
Patient ID: Tina Saunders, female   DOB: 1985-02-18, 31 y.o.   MRN: [id number]  Patient is reporting "not feeling well" this evening. There is a possibility that this is related to her elevated BP. She reports eating a rice krispy treat, ice cream, and cookies as well as drinking a Dr. Malachi Bonds today. Patient is encouraged to make better meal selections. Before dinner, patient's CBG is rechecked as she received Insulin. Patient's CBG is more elevated. MD Izediuno is made aware of patient's CBG and her current reports of not feeling well. MD Izediuno was also made aware of patient's elevated pulse. Patient is asked to stay back from dinner. She is given a carb modified tray and water to drink. Patient did eat some but did not completely finish her meal. Patient's CBG was again rechecked however remains elevated.

## 2016-11-02 NOTE — Progress Notes (Signed)
Recreation Therapy Notes  Date: 11/02/16 Time: 1000 Location: 500 Hall Dayroom  Group Topic: Communication, Team Building, Problem Solving  Goal Area(s) Addresses:  Patient will effectively work with peer towards shared goal.  Patient will identify skill used to make activity successful.  Patient will identify how skills used during activity can be used to reach post d/c goals.   Intervention: STEM Activity   Activity: Straw Bridge. In teams, patients were asked to build a straw bridge that could hold a small puzzle box.  Each group was given 20 straws and 2 ft of masking tape to complete the project.    Education: Education officer, community, Dentist.   Education Outcome: Acknowledges education/In group clarification offered/Needs additional education.   Clinical Observations/Feedback: Pt did not attend group.    Victorino Sparrow, LRT/CTRS         Victorino Sparrow A 11/02/2016 12:13 PM

## 2016-11-02 NOTE — Tx Team (Signed)
Initial Treatment Plan 11/02/2016 3:09 AM [name] TGP:[id number]    PATIENT STRESSORS: Other: none listed   PATIENT STRENGTHS: Ability for insight Average or above average intelligence Capable of independent living Communication skills General fund of knowledge Motivation for treatment/growth Physical Health   PATIENT IDENTIFIED PROBLEMS: "Help me find some coping skills for what going on right now in my life"  "Help me not to see [name]" referring to her v/h    Depression  Increased risk for suicide             DISCHARGE CRITERIA:  Ability to meet basic life and health needs Adequate post-discharge living arrangements Improved stabilization in mood, thinking, and/or behavior Medical problems require only outpatient monitoring Motivation to continue treatment in a less acute level of care Need for constant or close observation no longer present Reduction of life-threatening or endangering symptoms to within safe limits Safe-care adequate arrangements made Verbal commitment to aftercare and medication compliance  PRELIMINARY DISCHARGE PLAN: Outpatient therapy Placement in alternative living arrangements  PATIENT/FAMILY INVOLVEMENT: This treatment plan has been presented to and reviewed with the patient, [name], and/or family member.  The patient and family have been given the opportunity to ask questions and make suggestions.  Doran Heater, RN 11/02/2016, 3:09 AM

## 2016-11-02 NOTE — Plan of Care (Signed)
Problem: Coping: Goal: Ability to cope will improve Outcome: Progressing Pt denies SI at this time   

## 2016-11-02 NOTE — BHH Suicide Risk Assessment (Signed)
[facility] INPATIENT:  Family/Significant Other Suicide Prevention Education  Suicide Prevention Education:  Patient Refusal for Family/Significant Other Suicide Prevention Education: The patient Tina Saunders has refused to provide written consent for family/significant other to be provided Family/Significant Other Suicide Prevention Education during admission and/or prior to discharge.  Physician notified.  Frutoso Chase Florestine Carmical 11/02/2016, 10:56 AM

## 2016-11-02 NOTE — Tx Team (Signed)
Interdisciplinary Treatment and Diagnostic Plan Update  11/02/2016 Time of Session: 10:53 AM  Tina Saunders MRN: [id number]  Principal Diagnosis: Schizoaffective disorder, Bipolar-type.  Secondary Diagnoses: Active Problems:   Schizoaffective disorder, bipolar type (HCC)   Current Medications:  Current Facility-Administered Medications  Medication Dose Route Frequency Provider Last Rate Last Dose  . acetaminophen (TYLENOL) tablet 650 mg  650 mg Oral Q6H PRN Ethelene Hal, NP      . alum & mag hydroxide-simeth (MAALOX/MYLANTA) 200-200-20 MG/5ML suspension 30 mL  30 mL Oral Q4H PRN Ethelene Hal, NP      . busPIRone (BUSPAR) tablet 15 mg  15 mg Oral TID Ethelene Hal, NP   15 mg at 11/02/16 0907  . chlorproMAZINE (THORAZINE) tablet 50 mg  50 mg Oral TID Ethelene Hal, NP   50 mg at 11/02/16 0907  . cloNIDine (CATAPRES) tablet 0.1 mg  0.1 mg Oral BID Ethelene Hal, NP   0.1 mg at 11/02/16 5277  . fenofibrate tablet 160 mg  160 mg Oral Daily Ethelene Hal, NP   160 mg at 11/02/16 8242  . gabapentin (NEURONTIN) capsule 400 mg  400 mg Oral TID Ethelene Hal, NP   400 mg at 11/02/16 0906  . insulin aspart (novoLOG) injection 22 Units  22 Units Subcutaneous TID WC Ethelene Hal, NP      . insulin detemir (LEVEMIR) injection 68 Units  68 Units Subcutaneous BID Ethelene Hal, NP   68 Units at 11/02/16 0909  . lactulose (CHRONULAC) 10 GM/15ML solution 20 g  20 g Oral BID Ethelene Hal, NP   20 g at 11/02/16 0908  . lisinopril (PRINIVIL,ZESTRIL) tablet 10 mg  10 mg Oral Daily Ethelene Hal, NP   10 mg at 11/02/16 0906  . magnesium hydroxide (MILK OF MAGNESIA) suspension 30 mL  30 mL Oral Daily PRN Ethelene Hal, NP      . QUEtiapine (SEROQUEL) tablet 100 mg  100 mg Oral q morning - 10a Ethelene Hal, NP   100 mg at 11/02/16 3536  . QUEtiapine (SEROQUEL) tablet 800 mg  800 mg Oral QHS Ethelene Hal, NP      . sertraline (ZOLOFT) tablet 200 mg  200 mg Oral Karl Bales, NP   200 mg at 11/02/16 1443  . simvastatin (ZOCOR) tablet 10 mg  10 mg Oral QHS Ethelene Hal, NP      . topiramate (TOPAMAX) tablet 100 mg  100 mg Oral BID Ethelene Hal, NP   100 mg at 11/02/16 0906  . traZODone (DESYREL) tablet 50 mg  50 mg Oral QHS Ethelene Hal, NP      . Vitamin D (Ergocalciferol) (DRISDOL) capsule 50,000 Units  50,000 Units Oral Q Thu Romilda Garret Billey Chang, NP        PTA Medications: Prescriptions Prior to Admission  Medication Sig Dispense Refill Last Dose  . busPIRone (BUSPAR) 15 MG tablet Take 15 mg by mouth 3 (three) times daily.   11/01/2016 at Unknown time  . chlorproMAZINE (THORAZINE) 25 MG tablet Take 50 mg by mouth 3 (three) times daily.    11/01/2016 at Unknown time  . gabapentin (NEURONTIN) 400 MG capsule Take 1 capsule (400 mg total) by mouth 3 (three) times daily. 90 capsule 0 11/01/2016 at Unknown time  . insulin aspart (NOVOLOG FLEXPEN) 100 UNIT/ML FlexPen Inject 22 Units into the skin 3 (three) times daily with meals.  11/01/2016 at Unknown time  . insulin detemir (LEVEMIR) 100 UNIT/ML injection Inject 68 Units into the skin 2 (two) times daily.    11/01/2016 at Unknown time  . lactulose (CHRONULAC) 10 GM/15ML solution Take 20 g by mouth 2 (two) times daily.   11/01/2016 at Unknown time  . QUEtiapine (SEROQUEL) 100 MG tablet Take 100 mg by mouth every morning.   11/01/2016 at Unknown time  . QUEtiapine (SEROQUEL) 400 MG tablet Take 800 mg by mouth at bedtime.   11/01/2016 at Unknown time  . traZODone (DESYREL) 50 MG tablet Take 25 mg by mouth See admin instructions. Takes 1/2 tab at 0800, 1200, then takes 2 tabs at bedtime 2000   11/01/2016 at Unknown time  . Vitamin D, Ergocalciferol, (DRISDOL) 50000 units CAPS capsule Take 50,000 Units by mouth every Thursday.    Past Week at Unknown time  . cloNIDine (CATAPRES) 0.1 MG tablet Take 0.1  mg by mouth 2 (two) times daily.   Unknown at Unknown time  . fenofibrate (TRICOR) 145 MG tablet Take 145 mg by mouth daily.   Unknown at Unknown time  . lisinopril (PRINIVIL,ZESTRIL) 10 MG tablet Take 10 mg by mouth daily.   Unknown at Unknown time  . LORazepam (ATIVAN) 0.5 MG tablet Take 0.5 mg by mouth daily at 12 noon.   Unknown at Unknown time  . sertraline (ZOLOFT) 100 MG tablet Take 200 mg by mouth every morning.    Unknown at Unknown time  . simvastatin (ZOCOR) 10 MG tablet Take 10 mg by mouth at bedtime.   Unknown at Unknown time  . topiramate (TOPAMAX) 100 MG tablet Take 1 tablet (100 mg total) by mouth 2 (two) times daily. 60 tablet 0 Unknown at Unknown time    Treatment Modalities: Medication Management, Group therapy, Case management,  1 to 1 session with clinician, Psychoeducation, Recreational therapy.  Patient Stressors: Other: none listed  Patient Strengths: Ability for insight Average or above average intelligence Capable of independent living Communication skills General fund of knowledge Motivation for treatment/growth Physical Health   Physician Treatment Plan for Primary Diagnosis: Schizoaffective disorder, Bipolar-type. Long Term Goal(s): Improvement in symptoms so as ready for discharge  Short Term Goals: Ability to identify changes in lifestyle to reduce recurrence of condition will improve Ability to disclose and discuss suicidal ideas Ability to demonstrate self-control will improve Ability to identify and develop effective coping behaviors will improve Compliance with prescribed medications will improve  Medication Management: Evaluate patient's response, side effects, and tolerance of medication regimen.  Therapeutic Interventions: 1 to 1 sessions, Unit Group sessions and Medication administration.  Evaluation of Outcomes: Progressing  Physician Treatment Plan for Secondary Diagnosis: Active Problems:   Schizoaffective disorder, bipolar type  ([place])  Long Term Goal(s): Improvement in symptoms so as ready for discharge  Short Term Goals:    Medication Management: Evaluate patient's response, side effects, and tolerance of medication regimen.  Therapeutic Interventions: 1 to 1 sessions, Unit Group sessions and Medication administration.  Evaluation of Outcomes: Progressing   RN Treatment Plan for Primary Diagnosis: Schizoaffective disorder, Bipolar-type. Long Term Goal(s): Knowledge of disease and therapeutic regimen to maintain health will improve  Short Term Goals: Ability to remain free from injury will improve, Ability to disclose and discuss suicidal ideas, Ability to identify and develop effective coping behaviors will improve and Compliance with prescribed medications will improve  Medication Management: RN will administer medications as ordered by provider, will assess and evaluate patient's response and provide education to  patient for prescribed medication. RN will report any adverse and/or side effects to prescribing provider.  Therapeutic Interventions: 1 on 1 counseling sessions, Psychoeducation, Medication administration, Evaluate responses to treatment, Monitor vital signs and CBGs as ordered, Perform/monitor CIWA, COWS, AIMS and Fall Risk screenings as ordered, Perform wound care treatments as ordered.  Evaluation of Outcomes: Progressing   LCSW Treatment Plan for Primary Diagnosis: Schizoaffective disorder, Bipolar-type. Long Term Goal(s): Safe transition to appropriate next level of care at discharge, Engage patient in therapeutic group addressing interpersonal concerns.  Short Term Goals: Engage patient in aftercare planning with referrals and resources, Increase social support, Identify triggers associated with mental health/substance abuse issues and Increase skills for wellness and recovery  Therapeutic Interventions: Assess for all discharge needs, 1 to 1 time with Social worker, Explore available  resources and support systems, Assess for adequacy in community support network, Educate family and significant other(s) on suicide prevention, Complete Psychosocial Assessment, Interpersonal group therapy.  Evaluation of Outcomes: Progressing   Progress in Treatment: Attending groups: No Participating in groups: No Taking medication as prescribed: Yes Toleration of medication: Yes, no side effects reported at this time Family/Significant other contact made: No Patient understands diagnosis: Yes AEB asking for help with coping skills and stating that she struggles with her mental illness Discussing patient identified problems/goals with staff: Yes Medical problems stabilized or resolved: Yes Denies suicidal/homicidal ideation: Yes Issues/concerns per patient self-inventory: None Other: N/A  New problem(s) identified: None identified at this time.   New Short Term/Long Term Goal(s): Pt wants help with "Coping skills and feeling better".   Discharge Plan or Barriers: Pt will return to her apartment in [place]   Reason for Continuation of Hospitalization: Depression Hallucinations Medical Issues Medication stabilization Suicidal ideation   Estimated Length of Stay: 11/07/16  Attendees: Patient: Tina Saunders  11/02/2016  10:53 AM  Physician: Neita Garnet, MD 11/02/2016  10:53 AM  Nursing: Jonette Mate, RN 11/02/2016  10:53 AM  RN Care Manager: Lars Pinks, RN 11/02/2016  10:53 AM  Social Worker: Ripley Fraise, LCSW; Verdis Frederickson, Social Work Intern 11/02/2016  10:53 AM  Recreational Therapist: Victorino Sparrow, [place] 11/02/2016  10:53 AM  Other: Norberto Sorenson, [place] 11/02/2016  10:53 AM  Other:  11/02/2016  10:53 AM  Other: 11/02/2016  10:53 AM    Scribe for Treatment Team: [name], Student-Social Work 11/02/2016 10:53 AM
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
## 2016-11-03 DIAGNOSIS — R45851 Suicidal ideations: Secondary | ICD-10-CM

## 2016-11-03 DIAGNOSIS — E1065 Type 1 diabetes mellitus with hyperglycemia: Secondary | ICD-10-CM

## 2016-11-03 DIAGNOSIS — F29 Unspecified psychosis not due to a substance or known physiological condition: Secondary | ICD-10-CM

## 2016-11-03 DIAGNOSIS — R000 Tachycardia, unspecified: Secondary | ICD-10-CM

## 2016-11-03 DIAGNOSIS — E1165 Type 2 diabetes mellitus with hyperglycemia: Secondary | ICD-10-CM

## 2016-11-03 DIAGNOSIS — F609 Personality disorder, unspecified: Secondary | ICD-10-CM

## 2016-11-03 DIAGNOSIS — R443 Hallucinations, unspecified: Secondary | ICD-10-CM

## 2016-11-03 DIAGNOSIS — IMO0002 Reserved for concepts with insufficient information to code with codable children: Secondary | ICD-10-CM

## 2016-11-03 DIAGNOSIS — F39 Unspecified mood [affective] disorder: Secondary | ICD-10-CM

## 2016-11-03 DIAGNOSIS — G4489 Other headache syndrome: Secondary | ICD-10-CM

## 2016-11-03 DIAGNOSIS — F250 Schizoaffective disorder, bipolar type: Secondary | ICD-10-CM

## 2016-11-03 LAB — LIPID PANEL
CHOLESTEROL: 113 mg/dL (ref 0–200)
HDL: 19 mg/dL — AB (ref >40)
LDL Cholesterol: 21 mg/dL (ref 0–99)
TRIGLYCERIDES: 366 mg/dL — AB (ref <150)
Total CHOL/HDL Ratio: 5.9 RATIO
VLDL: 73 mg/dL — ABNORMAL HIGH (ref 0–40)

## 2016-11-03 LAB — GLUCOSE, CAPILLARY
GLUCOSE-CAPILLARY: 196 mg/dL — AB (ref 65–99)
Glucose-Capillary: 413 mg/dL — ABNORMAL HIGH (ref 65–99)
Glucose-Capillary: 320 mg/dL — ABNORMAL HIGH (ref 65–99)

## 2016-11-03 LAB — HEMOGLOBIN A1C
HEMOGLOBIN A1C: 10.9 % — AB (ref 4.8–5.6)
MEAN PLASMA GLUCOSE: 266.13 mg/dL

## 2016-11-03 MED ORDER — CHLORPROMAZINE HCL 50 MG PO TABS
50.0000 mg | ORAL_TABLET | Freq: Two times a day (BID) | ORAL | Status: DC
  Administered 2016-11-04: 50 mg via ORAL
  Filled 2016-11-03 (×5): qty 1

## 2016-11-03 MED ORDER — INSULIN DETEMIR 100 UNIT/ML ~~LOC~~ SOLN
68.0000 [IU] | Freq: Two times a day (BID) | SUBCUTANEOUS | Status: DC
  Administered 2016-11-04: 68 [IU] via SUBCUTANEOUS

## 2016-11-03 MED ORDER — TRAZODONE HCL 50 MG PO TABS
50.0000 mg | ORAL_TABLET | Freq: Every evening | ORAL | Status: DC | PRN
  Administered 2016-11-03: 50 mg via ORAL
  Filled 2016-11-03: qty 1

## 2016-11-03 MED ORDER — INSULIN ASPART 100 UNIT/ML ~~LOC~~ SOLN
30.0000 [IU] | Freq: Three times a day (TID) | SUBCUTANEOUS | Status: DC
  Administered 2016-11-04: 30 [IU] via SUBCUTANEOUS

## 2016-11-03 MED ORDER — INSULIN ASPART 100 UNIT/ML ~~LOC~~ SOLN
0.0000 [IU] | Freq: Three times a day (TID) | SUBCUTANEOUS | Status: DC
  Administered 2016-11-04: 3 [IU] via SUBCUTANEOUS

## 2016-11-03 MED ORDER — INSULIN ASPART 100 UNIT/ML ~~LOC~~ SOLN
0.0000 [IU] | Freq: Every day | SUBCUTANEOUS | Status: DC
  Administered 2016-11-03: 4 [IU] via SUBCUTANEOUS

## 2016-11-03 MED ORDER — INSULIN DETEMIR 100 UNIT/ML ~~LOC~~ SOLN: 70.0000 [IU] | Freq: Two times a day (BID) | SUBCUTANEOUS | Status: DC

## 2016-11-03 NOTE — Progress Notes (Signed)
DAR NOTE: Patient presents with anxious affect and depressed mood. Pt continues to flat and isolative, stays in the bed most of the time. Reports good sleep, good appetite, normal energy and good concentration. Denies pain, auditory and visual hallucinations.  Rates depression at 0, hopelessness at 0, and anxiety at 0.  Maintained on routine safety checks.  Medications given as prescribed.  Support and encouragement offered as needed. States goal for today is " going home." Patient observed socializing with peers in the dayroom.  Offered no complaint.

## 2016-11-03 NOTE — Social Work (Signed)
Referred to [facility] Team, is [place] resident.  Emmanuel Ercole, LCSW Lead Clinical Social Worker Phone:  [phone]  

## 2016-11-03 NOTE — BHH Group Notes (Signed)
[place] LCSW Group Therapy  11/03/2016  1:05 PM  Type of Therapy:  Group therapy  Participation Level:  Active  Participation Quality:  Attentive  Affect:  Flat  Cognitive:  Oriented  Insight:  Limited  Engagement in Therapy:  Limited  Modes of Intervention:  Discussion, Socialization  Summary of Progress/Problems:  Chaplain was here to lead a group on themes of hope and courage. Invited.  Chose to not attend.  Roque Lias B 11/03/2016 1:43 PM

## 2016-11-03 NOTE — Consult Note (Addendum)
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 T1DM, uncontrolled with hyperglycemia:  HbA1c 10.9.  Notes type 1, but denies hx of DKA and she requires very high doses of insulin.  At home she notes she takes 75 levemir BID and novolog 22 QID (she notes she takes at bedtime as well).  At home she notes she never has hypoglycemia and her BG's are continuously in the 400's.  She's denies hospitalization for DM.  BG's here consistently in 300's to 400's (this morning 196). 1. Increase mealtime to 30 units before meals (22 units at night not ordered, will d/c this and increase mealtime)  2. Continue basal at 68 levemir BID  3. Start SSI moderate intensity with qhs as well 4. Needs continued f/u with outpatient providers for continued titration (notes [facility] with total access care have been following her DM)  2.   Tachycardia  LH:  Unclear etiology, maybe some dehydration with hypergylcemia?  Negative orthostatics.  HR just up and down over past few days.  Considered sending to ED for IVF, but at this time, encourage PO.  Continue to monitor.  No CP, SOB, or O2 requirement.  Normal TSH.  EKG at admission with tachycardia and consistent with priors from 2016 (notably with old q waves).  Discussed with Dr. Parke Poisson and will decrease thorazine to BID as this is possible side effect.  F/u AM BMP and CBC, AM orthostatic  3.    Hypertension: continue lisinopril, clonidine  4.    Cushingnoid appearance:  Dorsocervical fat pad and protuberant abdomen.  Would be worth excluding cushings, but would want pts BG's better controlled  prior to dex suppression test.    5.   HLD: zocor, fenofibrate  6.  Vit D supplementation weekly  7.  Borderline personality disorder  Schizoaffective Disorder  Suicidal Ideation  Hallucinations:  Per primary, seems improved. Decrease thorazine as above.   Buspar, zoloft, seroquel, trazodone, topiramate  I will followup again tomorrow. Please contact me if I can be of assistance in the meanwhile. Thank you for this consultation.  Chief Complaint: hyperglycemia  HPI:  31 year old with history of poorly personality disorder, schizoaffective who was admitted to the behavioral health Hospital with suicidal ideation and hallucinations as well as worsening depression. TRH was consultation due to hyperglycemia.  She notes that she's been on Levemir 68 twice Florentina Marquart day as well as NovoLog 4 times Shadara Lopez day 22 units. Her blood sugars have never been controlled with this. She notes blood sugars in the 400s and occasionally the 500s at home. She's never had hypoglycemia on this regimen. She denies Lavoy Bernards history of DKA. Her diagnosis is type 1 diabetes. She denies hospitalization due to her diabetes.  She denies chest pain, shortness of breath, fevers, chills. She notes that her mood is better today she's no longer having hallucinations. She states that she feels generally Maansi Wike little bit lightheaded when standing. She is thirsty and notes frequent urination.  No LEE.   Review of Systems:  Calyse Murcia 10 point ROS was performed as per HPI  Past Medical History:  Diagnosis Date  . Anxiety   . Bipolar 1 disorder ([place])   . Cancer of abdominal  wall   . Depression   . Diabetes mellitus without complication ([place])   . Hypertension   . Obesity   . Obesity   . Polycystic ovarian syndrome 07/01/2011   Patient report  . 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 Schizophrenia [facility])    Past Surgical History:  Procedure Laterality Date  . CHOLECYSTECTOMY    . HERNIA REPAIR    . Ovarian Cyst Excision    . VARICOSE VEIN SURGERY     Social  History:  reports that she has never smoked. She has never used smokeless tobacco. She reports that she does not drink alcohol or use drugs.  Allergies  Allergen Reactions  . Fish-Derived Products Anaphylaxis    Can only eat FLounder  . Geodon [Ziprasidone Hcl] Other (See Comments)    Face pulls, cant swallow - Locked Jaw  . Haldol [Haloperidol Lactate] Other (See Comments)    Face pulls, can't swallow - Locked Jaw  . Buprenorphine Hcl Hives, Itching, Rash and Other (See Comments)    GI upset  . Compazine [Prochlorperazine] Other (See Comments)    anxiety and hyperactivity  . Morphine And Related Hives, Itching, Rash and Other (See Comments)    GI upset  . Toradol [Ketorolac Tromethamine] Other (See Comments)    Anxiety and hyperactivity   Family History  Problem Relation Age of Onset  . Coronary artery disease Maternal Grandmother   . Diabetes type II Maternal Grandmother   . Cancer Maternal Grandmother   . Hypertension Mother   . Hypertension Father     Prior to Admission medications   Medication Sig Start Date End Date Taking? Authorizing Provider  busPIRone (BUSPAR) 15 MG tablet Take 15 mg by mouth 3 (three) times daily.   Yes [provider]  chlorproMAZINE (THORAZINE) 25 MG tablet Take 50 mg by mouth 3 (three) times daily.    Yes [provider]  gabapentin (NEURONTIN) 400 MG capsule Take 1 capsule (400 mg total) by mouth 3 (three) times daily. 04/05/16  Yes Lord, Asa Saunas, NP  insulin aspart (NOVOLOG FLEXPEN) 100 UNIT/ML FlexPen Inject 22 Units into the skin 3 (three) times daily with meals.   Yes [provider]  insulin detemir (LEVEMIR) 100 UNIT/ML injection Inject 68 Units into the skin 2 (two) times daily.    Yes [provider]  lactulose (CHRONULAC) 10 GM/15ML solution Take 20 g by mouth 2 (two) times daily.   Yes [provider]  QUEtiapine (SEROQUEL) 100 MG tablet Take 100 mg by mouth every morning.   Yes [provider]  QUEtiapine (SEROQUEL) 400 MG tablet Take 800 mg by mouth at bedtime.   Yes [provider]  traZODone (DESYREL) 50 MG tablet Take 25 mg by mouth See admin instructions. Takes 1/2 tab at 0800, 1200, then takes 2 tabs at bedtime 2000   Yes [provider]  Vitamin D, Ergocalciferol, (DRISDOL) 50000 units CAPS capsule Take 50,000 Units by mouth every Thursday.    Yes [provider]  cloNIDine (CATAPRES) 0.1 MG tablet Take 0.1 mg by mouth 2 (two) times daily.    [provider]  fenofibrate (TRICOR) 145 MG tablet Take 145 mg by mouth daily.    [provider]  lisinopril (PRINIVIL,ZESTRIL) 10 MG tablet Take 10 mg by mouth daily.    [provider]  LORazepam (ATIVAN) 0.5 MG tablet Take 0.5 mg by mouth daily at 12 noon.    [provider]  sertraline (ZOLOFT) 100 MG tablet  Take 200 mg by mouth every morning.     [provider]  simvastatin (ZOCOR) 10 MG tablet Take 10 mg by mouth at bedtime.    [provider]  topiramate (TOPAMAX) 100 MG tablet Take 1 tablet (100 mg total) by mouth 2 (two) times daily. 04/05/16   Patrecia Pour, NP   Physical Exam: Blood pressure 124/61, pulse 97, temperature 98.2 F (36.8 C), temperature source Oral, resp. rate 18, height 5\' 4"  (1.626 m), weight 108.4 kg (239 lb), last menstrual period 10/10/2016, SpO2 97 %. Vitals:   11/03/16 1702 11/03/16 1704  BP: 118/68 124/61  Pulse: (!) 106 97  Resp:    Temp:    SpO2:      Physical Exam  Constitutional: She is oriented to person, place, and time. She appears well-developed and well-nourished. No distress.  Cushingnoid appearance, buffalo hump, protuberant abdomen  HENT:  Head: Normocephalic and atraumatic.  Eyes: Pupils are equal, round, and reactive to light. EOM are normal.  Neck: Normal range of motion. Neck supple.  Cardiovascular: Exam reveals no friction rub.   No murmur heard. Mildly tachy to ~100s   Pulmonary/Chest: Effort normal. No respiratory distress. She has no wheezes.  Abdominal: Soft.  Musculoskeletal: Normal range of motion. She exhibits no edema.  Neurological: She is alert and oriented to person, place, and time. No cranial nerve deficit.  Skin: Skin is warm and dry. She is not diaphoretic.  Psychiatric:  Denies hallucinations, notes mood is better   Labs on Admission:  Basic Metabolic Panel:  Recent Labs Lab 10/31/16 2251  NA 134*  K 4.1  CL 103  CO2 18*  GLUCOSE 475*  BUN 8  CREATININE 0.53  CALCIUM 9.7   Liver Function Tests:  Recent Labs Lab 10/31/16 2251  AST 44*  ALT 28  ALKPHOS 107  BILITOT 0.5  PROT 8.3*  ALBUMIN 4.3   No results for input(s): LIPASE, AMYLASE in the last 168 hours. No results for input(s): AMMONIA in the last 168 hours. CBC:  Recent Labs Lab 10/31/16 2251  WBC 5.5  HGB 12.2  HCT 37.5  MCV 81.5  PLT 142*   Cardiac Enzymes: No results for input(s): CKTOTAL, CKMB, CKMBINDEX, TROPONINI in the last 168 hours. BNP: Invalid input(s): POCBNP CBG:  Recent Labs Lab 11/02/16 1859 11/02/16 2006 11/03/16 0616 11/03/16 1141 11/03/16 2033  GLUCAP 405* 341* 196* 413* 320*    Radiological Exams on Admission: No results found.  EKG: Independently reviewed. Consistent with priors.  Sinus tachycardia, old Q's in III, V1, V2, V3, aVR.  Time spent: over 1 hour  [facility] [phone]   If 7PM-7AM, please contact night-coverage [email or web address] Password [facility] 11/03/2016, 8:49 PM

## 2016-11-03 NOTE — Progress Notes (Signed)
D: Pt denies SI/HI/AHV. Pt is pleasant and cooperative. Pt stated she had a bad day . Pt stated she was dizzy , but has not presented as if she was in any distress this evening, pt appears to try to get people to try to feel sorry for her and possibly pay extra attention.   A: Pt was offered support and encouragement. Pt was given scheduled medications. Pt was encourage to attend groups. Q 15 minute checks were done for safety.   R:Pt attends groups  Pt is taking medication. Pt receptive to treatment and safety maintained on unit.

## 2016-11-03 NOTE — Progress Notes (Addendum)
[facility] MD Progress Note  11/03/2016 3:31 PM Tina Saunders  MRN:  [id number] Subjective:  Reports she is feeling better. States she is no longer experiencing hallucinations, denies suicidal ideations, and states she is hoping she will be able to be discharged soon. Denies medication side effects.  Objective: I have discussed case with treatment team and have met with patient. Patient is 31 year old single female, on disability. She  presented for worsening  depression, suicidal ideations, and hallucinations . As above, she is currently reporting feeling better, and denies hallucinations. At this time does not appear internally preoccupied .  Denies suicidal ideations. Denies medication side effects, we reviewed mediations , dosages. Patient is on several psychiatric medications, including Zoloft, Topamax,Buspar,  high dose Seroquel , Thorazine. States she has been on this combination of antipsychotics  for years , without side effects .  Of note, at this time specifically denies anticholinergic side effects , serotonin syndrome type symptoms, or  feeling dizzy or lightheaded.  Group participation has been limited . States " I have my own problems, I don't want to hear about other people's problems as well ".  Patient has history of DM- her HgbA1C on 10/12 is 10.9. CBG is 413 today.  TSH 1.589, Lipid Panel remarkable for low HGL (19) and hight Triglycerides ( 366)      Principal Problem: Cluster B personality disorder (HCC) Diagnosis:   Patient Active Problem List   Diagnosis Date Noted  . Schizoaffective disorder, bipolar type ([place]) [F25.0] 11/02/2016  . 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 Schizoaffective disorder, mixed type ([place]) [F25.0] 05/30/2014  . Suicidal ideation [R45.851]   . 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 Headache [R51] 04/15/2013  . HTN (hypertension) [I10] 04/15/2013  . Post traumatic stress disorder [F43.10] 12/07/2011  . CAP (community  acquired pneumonia) [J18.9] 08/27/2011  . Chest pain [R07.9] 08/26/2011  . SOB (shortness of breath) [R06.02] 08/26/2011  . Fever [R50.9] 08/26/2011  . Hypokalemia [E87.6] 08/26/2011  . PSVT (paroxysmal supraventricular tachycardia) ([place]) [I47.1] 08/26/2011  . ADHD [F90.9] 09/23/2007  . EPIGASTRIC PAIN [R10.13] 09/23/2007  . Obesity, unspecified [E66.9] 07/30/2007  . DEPRESSION [F32.9] 07/30/2007  . SLEEP DISORDER [G47.9] 07/30/2007  . IMPAIRED FASTING GLUCOSE [R73.01] 07/30/2007  . FATIGUE [R53.81, R53.83] 11/21/2006  . ABNORMAL FINDINGS, ELEVATED BP W/O HTN [R03.0] 11/21/2006  . METRORRHAGIA [N92.1] 06/13/2006  . DISORDER, MENSTRUAL NEC [N94.9] 06/13/2006  . DIZZINESS [R42] 06/13/2006  . POLYCYSTIC OVARIAN DISEASE [E28.2] 04/25/2006  . AMENORRHEA, SECONDARY [N91.2] 04/20/2006  . ACNE, MILD [L70.8] 04/20/2006  . ABDOMINAL PAIN [R10.9] 04/20/2006   Total Time spent with patient: 20 minutes  Past Psychiatric History:   Past Medical History:  Past Medical History:  Diagnosis Date  . Anxiety   . Bipolar 1 disorder ([place])   . Cancer of abdominal wall   . Depression   . Diabetes mellitus without complication ([place])   . Hypertension   . Obesity   . Obesity   . Polycystic ovarian syndrome 07/01/2011   Patient report  . 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 Schizophrenia [facility])     Past Surgical History:  Procedure Laterality Date  . CHOLECYSTECTOMY    . HERNIA REPAIR    . Ovarian Cyst Excision    . VARICOSE VEIN SURGERY     Family History:  Family History  Problem Relation Age of Onset  . Coronary artery disease Maternal Grandmother   . Diabetes type II Maternal Grandmother   .  Cancer Maternal Grandmother   . Hypertension Mother   . Hypertension Father    Family Psychiatric  History:  Social History:  History  Alcohol Use No     History  Drug Use No    Social History   Social History  . Marital status: Single    Spouse name: N/A  . Number of children: N/A  . Years of  education: N/A   Social History Main Topics  . Smoking status: Never Smoker  . Smokeless tobacco: Never Used  . Alcohol use No  . Drug use: No  . Sexual activity: No   Other Topics Concern  . None   Social History Narrative  . None   Additional Social History:   Sleep: Good  Appetite:  Good  Current Medications: Current Facility-Administered Medications  Medication Dose Route Frequency Provider Last Rate Last Dose  . acetaminophen (TYLENOL) tablet 650 mg  650 mg Oral Q6H PRN Ethelene Hal, NP   650 mg at 11/02/16 2007  . alum & mag hydroxide-simeth (MAALOX/MYLANTA) 200-200-20 MG/5ML suspension 30 mL  30 mL Oral Q4H PRN Ethelene Hal, NP      . busPIRone (BUSPAR) tablet 15 mg  15 mg Oral TID Ethelene Hal, NP   15 mg at 11/03/16 1209  . chlorproMAZINE (THORAZINE) tablet 50 mg  50 mg Oral TID Ethelene Hal, NP   50 mg at 11/03/16 1209  . cloNIDine (CATAPRES) tablet 0.1 mg  0.1 mg Oral BID Ethelene Hal, NP   0.1 mg at 11/03/16 0843  . fenofibrate tablet 160 mg  160 mg Oral Daily Ethelene Hal, NP   160 mg at 11/03/16 0843  . gabapentin (NEURONTIN) capsule 400 mg  400 mg Oral TID Ethelene Hal, NP   400 mg at 11/03/16 1209  . insulin aspart (novoLOG) injection 22 Units  22 Units Subcutaneous TID WC Ethelene Hal, NP   22 Units at 11/03/16 1210  . insulin detemir (LEVEMIR) injection 68 Units  68 Units Subcutaneous BID Ethelene Hal, NP   68 Units at 11/03/16 0855  . lactulose (CHRONULAC) 10 GM/15ML solution 20 g  20 g Oral BID Ethelene Hal, NP   20 g at 11/03/16 0843  . lisinopril (PRINIVIL,ZESTRIL) tablet 10 mg  10 mg Oral Daily Ethelene Hal, NP   10 mg at 11/03/16 2482  . magnesium hydroxide (MILK OF MAGNESIA) suspension 30 mL  30 mL Oral Daily PRN Ethelene Hal, NP      . QUEtiapine (SEROQUEL) tablet 100 mg  100 mg Oral q morning - 10a Ethelene Hal, NP   100 mg at 11/03/16  1209  . QUEtiapine (SEROQUEL) tablet 800 mg  800 mg Oral QHS Ethelene Hal, NP   800 mg at 11/02/16 2150  . sertraline (ZOLOFT) tablet 200 mg  200 mg Oral Karl Bales, NP   200 mg at 11/03/16 5003  . simvastatin (ZOCOR) tablet 10 mg  10 mg Oral QHS Ethelene Hal, NP   10 mg at 11/02/16 2151  . topiramate (TOPAMAX) tablet 100 mg  100 mg Oral BID Ethelene Hal, NP   100 mg at 11/03/16 0843  . traZODone (DESYREL) tablet 50 mg  50 mg Oral QHS Ethelene Hal, NP   50 mg at 11/02/16 2150  . Vitamin D (Ergocalciferol) (DRISDOL) capsule 50,000 Units  50,000 Units Oral Q Derl Barrow Billey Chang, NP   50,000 Units at  11/02/16 1146    Lab Results:  Results for orders placed or performed during the hospital encounter of 11/02/16 (from the past 48 hour(s))  Glucose, capillary     Status: Abnormal   Collection Time: 11/02/16  7:53 AM  Result Value Ref Range   Glucose-Capillary 349 (H) 65 - 99 mg/dL  Glucose, capillary     Status: Abnormal   Collection Time: 11/02/16 11:46 AM  Result Value Ref Range   Glucose-Capillary 337 (H) 65 - 99 mg/dL  Glucose, capillary     Status: Abnormal   Collection Time: 11/02/16  4:26 PM  Result Value Ref Range   Glucose-Capillary 454 (H) 65 - 99 mg/dL  Glucose, capillary     Status: Abnormal   Collection Time: 11/02/16  5:22 PM  Result Value Ref Range   Glucose-Capillary 463 (H) 65 - 99 mg/dL  TSH     Status: None   Collection Time: 11/02/16  6:33 PM  Result Value Ref Range   TSH 1.589 0.350 - 4.500 uIU/mL    Comment: Performed by a 3rd Generation assay with a functional sensitivity of <=0.01 uIU/mL. Performed at [facility], [place] [address] [place], [place] [address]   Glucose, capillary     Status: Abnormal   Collection Time: 11/02/16  6:59 PM  Result Value Ref Range   Glucose-Capillary 405 (H) 65 - 99 mg/dL  Glucose, capillary     Status: Abnormal   Collection Time: 11/02/16  8:06 PM   Result Value Ref Range   Glucose-Capillary 341 (H) 65 - 99 mg/dL  Glucose, capillary     Status: Abnormal   Collection Time: 11/03/16  6:16 AM  Result Value Ref Range   Glucose-Capillary 196 (H) 65 - 99 mg/dL  Hemoglobin A1c     Status: Abnormal   Collection Time: 11/03/16  6:37 AM  Result Value Ref Range   Hgb A1c MFr Bld 10.9 (H) 4.8 - 5.6 %    Comment: (NOTE) Pre diabetes:          5.7%-6.4% Diabetes:              >6.4% Glycemic control for   <7.0% adults with diabetes    Mean Plasma Glucose 266.13 mg/dL    Comment: Performed at [facility], [place] [address], [place], [place] [address]  Lipid panel     Status: Abnormal   Collection Time: 11/03/16  6:37 AM  Result Value Ref Range   Cholesterol 113 0 - 200 mg/dL   Triglycerides 366 (H) <150 mg/dL   HDL 19 (L) >40 mg/dL   Total CHOL/HDL Ratio 5.9 RATIO   VLDL 73 (H) 0 - 40 mg/dL   LDL Cholesterol 21 0 - 99 mg/dL    Comment:        Total Cholesterol/HDL:CHD Risk Coronary Heart Disease Risk Table                     Men   Women  1/2 Average Risk   3.4   3.3  Average Risk       5.0   4.4  2 X Average Risk   9.6   7.1  3 X Average Risk  23.4   11.0        Use the calculated Patient Ratio above and the CHD Risk Table to determine the patient's CHD Risk.        ATP III CLASSIFICATION (LDL):  <100     mg/dL  Optimal  100-129  mg/dL   Near or Above                    Optimal  130-159  mg/dL   Borderline  160-189  mg/dL   High  >190     mg/dL   Very High Performed at [place] [address] [place], [place] [address]   Glucose, capillary     Status: Abnormal   Collection Time: 11/03/16 11:41 AM  Result Value Ref Range   Glucose-Capillary 413 (H) 65 - 99 mg/dL    Blood Alcohol level:  Lab Results  Component Value Date   ETH <10 10/31/2016   ETH <5 97/67/3419    Metabolic Disorder Labs: Lab Results  Component Value Date   HGBA1C 10.9 (H) 11/03/2016   MPG 266.13 11/03/2016   Lab Results   Component Value Date   PROLACTIN 10.9 06/13/2006   Lab Results  Component Value Date   CHOL 113 11/03/2016   TRIG 366 (H) 11/03/2016   HDL 19 (L) 11/03/2016   CHOLHDL 5.9 11/03/2016   VLDL 73 (H) 11/03/2016   LDLCALC 21 11/03/2016   LDLCALC 42 04/26/2006    Physical Findings: AIMS: Facial and Oral Movements Muscles of Facial Expression: None, normal Lips and Perioral Area: None, normal Jaw: None, normal Tongue: None, normal,Extremity Movements Upper (arms, wrists, hands, fingers): None, normal Lower (legs, knees, ankles, toes): None, normal, Trunk Movements Neck, shoulders, hips: None, normal, Overall Severity Severity of abnormal movements (highest score from questions above): None, normal Incapacitation due to abnormal movements: None, normal Patient's awareness of abnormal movements (rate only patient's report): No Awareness, Dental Status Current problems with teeth and/or dentures?: No Does patient usually wear dentures?: No  CIWA:    COWS:     Musculoskeletal: Strength & Muscle Tone: within normal limits Gait & Station: normal Patient leans: N/A  Psychiatric Specialty Exam: Physical Exam  ROS denies headache, no chest pain, no shortness of breath, no vomiting , reports LE pain related to peripheral neuropathy . Denies blurry vision, denies dry mouth, denies constipation.  Blood pressure 111/72, pulse (!) 111, temperature 98.2 F (36.8 C), temperature source Oral, resp. rate 18, height 5' 4"  (1.626 m), weight 108.4 kg (239 lb), last menstrual period 10/10/2016, SpO2 97 %.Body mass index is 41.02 kg/m.  General Appearance: Fairly Groomed  Eye Contact:  Good  Speech:  Normal Rate  Volume:  Decreased  Mood:  denies feeling depressed, states mood is " 6/10", and feeling better than on admission  Affect:  restricted, vaguely anxious   Thought Process:  Linear and Descriptions of Associations: Intact  Orientation:  Other:  alert and attentive   Thought Content:  no  hallucinations, no delusions, not internally preoccupied   Suicidal Thoughts:  No denies suicidal ideations at this time, denies self injurious ideations, denies homicidal or violent ideations  Homicidal Thoughts:  No  Memory:  recent and remote grossly intact   Judgement:  Fair  Insight:  Fair  Psychomotor Activity:  Decreased  Concentration:  Concentration: Good and Attention Span: Good  Recall:  Good  Fund of Knowledge:  Good  Language:  Fair  Akathisia:  Negative  Handed:  Right  AIMS (if indicated):     Assets:  Desire for Improvement Resilience  ADL's:  Improving   Cognition:  WNL  Sleep:  Number of Hours: 6.75   Assessment - patient is reporting improving mood, but presents with blunted, anxious affect  at this time. Denies suicidal ideations. Denies psychotic symptoms and does not appear internally preoccupied . She reports she has been on Thorazine and high dose of  Seroquel combination for years, and currently denies medication side effects. We discussed potential side effects/interactions, as well as  potential for Seroquel to contribute to weight gain and hyperglycemia - she reports some apprehension  to taper down or change medication regimen " because they have helped". Will D/C Seroquel QAM dose .    Treatment Plan Summary: Daily contact with patient to assess and evaluate symptoms and progress in treatment, Medication management, Plan inpatient treatment  and medications as below Encourage group and milieu participation to work on coping skills and symptom reduction Continue Buspar 15 mgrs TID for anxiety Continue Thorazine 50 mgrs 50 mgrs TID for mood disorder, psychotic symptoms Decrease Seroquel to  800 mgrs QHS for mood disorder , psychosis  Continue Zoloft 200 mgrs QDAY for depression Continue Topamax 100 mgrs BID for mood disorder , headache prophylaxis  Continue Trazodone 50 mgrs QHS PRN for insomnia  Treatment team working on disposition planning  I have  requested hospitalist consultation for optimization of DM management    Jenne Campus, MD 11/03/2016, 3:31 PM

## 2016-11-03 NOTE — Progress Notes (Signed)
Recreation Therapy Notes  Date: 11/03/16 Time: 1015 Location: 500 Hall Dayroom  Group Topic: Communication  Goal Area(s) Addresses:  Patient will effectively communicate with peers in group.  Patient will verbalize benefit of healthy communication. Patient will verbalize positive effect of healthy communication on post d/c goals.  Patient will identify communication techniques that made activity effective for group.   Intervention:  [employer], writing utensils, blank paper  Activity:  Back to Back Drawings.  Patients were seated in pairs back to back.  One patient would give directions and the other patient would listen.  The patients doing the talking were given a geometrical picture which they were to give instructions for their partner to draw.  The patients listening where to draw the pictures being described.  Patients would switch roles on the second round.  Education: Communication, Discharge Planning  Education Outcome: Acknowledges understanding/In group clarification offered/Needs additional education.   Clinical Observations/Feedback: Pt did not attend group.    Victorino Sparrow, LRT/CTRS         Victorino Sparrow A 11/03/2016 11:28 AM

## 2016-11-03 NOTE — Plan of Care (Signed)
Problem: Coping: Goal: Ability to cope will improve Outcome: Progressing Pt denies AVH at this time  Problem: Activity: Goal: Interest or engagement in activities will improve Outcome: Progressing Pt seen in the dayroom, on the phone this evening Goal: Sleeping patterns will improve Outcome: Progressing Pt slept over 5 hrs last night

## 2016-11-04 LAB — CBC
HEMATOCRIT: 36.4 % (ref 36.0–46.0)
Hemoglobin: 11.8 g/dL — ABNORMAL LOW (ref 12.0–15.0)
MCH: 26.7 pg (ref 26.0–34.0)
MCHC: 32.4 g/dL (ref 30.0–36.0)
MCV: 82.4 fL (ref 78.0–100.0)
PLATELETS: 118 10*3/uL — AB (ref 150–400)
RBC: 4.42 MIL/uL (ref 3.87–5.11)
RDW: 15.4 % (ref 11.5–15.5)
WBC: 4.2 10*3/uL (ref 4.0–10.5)

## 2016-11-04 LAB — BASIC METABOLIC PANEL
Anion gap: 11 (ref 5–15)
BUN: 7 mg/dL (ref 6–20)
CALCIUM: 9 mg/dL (ref 8.9–10.3)
CO2: 21 mmol/L — ABNORMAL LOW (ref 22–32)
CREATININE: 0.49 mg/dL (ref 0.44–1.00)
Chloride: 108 mmol/L (ref 101–111)
GFR calc non Af Amer: >60 mL/min (ref >60)
GLUCOSE: 207 mg/dL — AB (ref 65–99)
Potassium: 3.9 mmol/L (ref 3.5–5.1)
Sodium: 140 mmol/L (ref 135–145)

## 2016-11-04 LAB — PROLACTIN: Prolactin: 63.7 ng/mL — ABNORMAL HIGH (ref 4.8–23.3)

## 2016-11-04 LAB — GLUCOSE, CAPILLARY: GLUCOSE-CAPILLARY: 191 mg/dL — AB (ref 65–99)

## 2016-11-04 MED ORDER — INSULIN DETEMIR 100 UNIT/ML ~~LOC~~ SOLN: 70.0000 [IU] | Freq: Two times a day (BID) | SUBCUTANEOUS | 11 refills | Status: DC

## 2016-11-04 MED ORDER — QUETIAPINE FUMARATE 400 MG PO TABS: 800.0000 mg | ORAL_TABLET | Freq: Every day | ORAL | 0 refills | Status: DC

## 2016-11-04 MED ORDER — TRAZODONE HCL 50 MG PO TABS: 50.0000 mg | ORAL_TABLET | Freq: Every evening | ORAL | 0 refills | Status: DC | PRN

## 2016-11-04 MED ORDER — TOPIRAMATE 100 MG PO TABS: 100.0000 mg | ORAL_TABLET | Freq: Two times a day (BID) | ORAL | 0 refills | Status: DC

## 2016-11-04 MED ORDER — SIMVASTATIN 10 MG PO TABS: 10.0000 mg | ORAL_TABLET | Freq: Every day | ORAL | 0 refills | Status: DC

## 2016-11-04 MED ORDER — FENOFIBRATE 160 MG PO TABS: 160.0000 mg | ORAL_TABLET | Freq: Every day | ORAL | 0 refills | Status: DC

## 2016-11-04 MED ORDER — SERTRALINE HCL 100 MG PO TABS: 200.0000 mg | ORAL_TABLET | ORAL | 0 refills | Status: DC

## 2016-11-04 MED ORDER — GABAPENTIN 400 MG PO CAPS: 400.0000 mg | ORAL_CAPSULE | Freq: Three times a day (TID) | ORAL | 0 refills | Status: DC

## 2016-11-04 MED ORDER — INSULIN DETEMIR 100 UNIT/ML ~~LOC~~ SOLN: 70.0000 [IU] | Freq: Two times a day (BID) | SUBCUTANEOUS | Status: DC

## 2016-11-04 MED ORDER — BUSPIRONE HCL 15 MG PO TABS: 15.0000 mg | ORAL_TABLET | Freq: Three times a day (TID) | ORAL | 0 refills | Status: DC

## 2016-11-04 MED ORDER — CLONIDINE HCL 0.1 MG PO TABS: 0.1000 mg | ORAL_TABLET | Freq: Two times a day (BID) | ORAL | 11 refills | Status: DC

## 2016-11-04 MED ORDER — CHLORPROMAZINE HCL 50 MG PO TABS: 50.0000 mg | ORAL_TABLET | Freq: Two times a day (BID) | ORAL | 0 refills | Status: DC

## 2016-11-04 NOTE — [facility] Group Notes (Signed)
  [facility] LCSW Group Therapy Note  Date/Time:  11/04/2016 11:15AM-12:00PM  Type of Therapy and Topic:  Group Therapy:  Feelings About Hospitalization  Participation Level:  Active   Description of Group This process group involved patients discussing their feelings related to being hospitalized, as well as the benefits they see to being in the hospital.  These feelings and benefits were itemized.  The group then brainstormed specific ways in which they could seek those same benefits when they discharge and return home.  Therapeutic Goals 1. Patient will identify and describe positive and negative feelings related to hospitalization 2. Patient will verbalize benefits of hospitalization to themselves personally 3. Patients will brainstorm together ways they can obtain similar benefits in the outpatient setting, identify barriers to wellness and possible solutions  Summary of Patient Progress:  The patient expressed her primary feelings about being hospitalized are good because she needed it but now she's ready to leave.  She said the first thing she will do when she gets home is lay down on her bed.  When we were discussing sleep hygiene, she said she does not normally go to bed/sleep until 5-6am because she stays on her laptop all night.  She was not willing to try any of the sleep hygiene ideas presented, laughed instead.  Therapeutic Modalities Cognitive Behavioral Therapy Motivational Interviewing    Selmer Dominion, LCSW 11/04/2016, 12:55 PM

## 2016-11-04 NOTE — Progress Notes (Signed)
[facility] Group Notes:  (Nursing/MHT/Case Management/Adjunct)  Date:  11/04/2016  Time:  4:57 AM  Type of Therapy:  Psychoeducational Skills  Participation Level:  Minimal  Participation Quality:  Inattentive  Affect:  Depressed  Cognitive:  Lacking  Insight:  Lacking  Engagement in Group:  Resistant  Modes of Intervention:  Education  Summary of Progress/Problems: Patient shared in group that she rested a great deal yesterday but would not divulge any additional details about her day. Her goal for tomorrow is to get discharged.   Pearley Millington S 11/04/2016, 4:57 AM

## 2016-11-04 NOTE — Treatment Plan (Signed)
Labs and glucose trends reviewed this AM. Glucose 320 overnight and 191 this AM with a1c of 10.9. Long-acting glucose was adjusted. However, before patient can be physically seen, she was discharged home.

## 2016-11-04 NOTE — Progress Notes (Signed)
Patient ID: Tina Saunders, female   DOB: 19-Feb-1985, 31 y.o.   MRN: [id number]   Pt was discharged to home, she reported that she lives by herself and needed a cab voucher. Pt was provided with a cab voucher by the SW. Pt reported being ready for discharge, no issues or concerns noted. Pt reported that she was negative SI/HI, no AH/VH noted.

## 2016-11-04 NOTE — Discharge Summary (Signed)
Physician Discharge Summary Note  Patient:  Tina Saunders is an 31 y.o., female MRN:  [id number] DOB:  Apr 05, 1985 Patient phone:  [phone] (home)  Patient address:   [address] [place] [address],  Total Time spent with patient: 30 minutes  Date of Admission:  11/02/2016 Date of Discharge: 11/04/2016  Reason for Admission: Per H&P- This is an admission assessment for this 31 year old Caucasian female with hx of chronic medical/mental illnesses & borderline personality disorder. Admitted to the [facility] with complaints of suicidal ideations with plans to overdose on her medications & auditory/visual hallucinations. Chart review indicated hx of suicide attempt. During this assessment, Tina Saunders reports, "The police took me to the hospital 2 days ago. I called them because, I was thinking about taking bunch of pills to kill myself. I have been feeling suicidal x 2 days, triggered by family problems which I'm not ready to talk about today. My depression got worst in those 2 days. I was feeling very hopeless. I'm on medicine for depression. It is called Zoloft. It works. I was also hearing voices & seeing a man named Tina Saunders on that day too. He was telling me to go a head & kill myself. I attempted suicide 6 years ago by cutting on my wrist. I was hospitalized for 3 days. I don't drink alcohol or use drugs".  Principal Problem: Cluster B personality disorder [facility]) Discharge Diagnoses: Patient Active Problem List   Diagnosis Date Noted  . Uncontrolled diabetes mellitus ([place]) [E11.65] 11/03/2016  . Schizoaffective disorder, bipolar type ([place]) [F25.0] 11/02/2016  . Cluster B personality disorder ([place]) [F60.9] 11/02/2016  . Schizoaffective disorder, mixed type ([place]) [F25.0] 05/30/2014  . Suicidal ideation [R45.851]   . 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 Headache [R51] 04/15/2013  . HTN (hypertension) [I10] 04/15/2013  . Post traumatic stress disorder [F43.10] 12/07/2011   . CAP (community acquired pneumonia) [J18.9] 08/27/2011  . Chest pain [R07.9] 08/26/2011  . SOB (shortness of breath) [R06.02] 08/26/2011  . Fever [R50.9] 08/26/2011  . Hypokalemia [E87.6] 08/26/2011  . PSVT (paroxysmal supraventricular tachycardia) ([place]) [I47.1] 08/26/2011  . ADHD [F90.9] 09/23/2007  . EPIGASTRIC PAIN [R10.13] 09/23/2007  . Obesity, unspecified [E66.9] 07/30/2007  . DEPRESSION [F32.9] 07/30/2007  . SLEEP DISORDER [G47.9] 07/30/2007  . IMPAIRED FASTING GLUCOSE [R73.01] 07/30/2007  . FATIGUE [R53.81, R53.83] 11/21/2006  . ABNORMAL FINDINGS, ELEVATED BP W/O HTN [R03.0] 11/21/2006  . METRORRHAGIA [N92.1] 06/13/2006  . DISORDER, MENSTRUAL NEC [N94.9] 06/13/2006  . DIZZINESS [R42] 06/13/2006  . POLYCYSTIC OVARIAN DISEASE [E28.2] 04/25/2006  . AMENORRHEA, SECONDARY [N91.2] 04/20/2006  . ACNE, MILD [L70.8] 04/20/2006  . ABDOMINAL PAIN [R10.9] 04/20/2006    Past Psychiatric History:  Past Medical History:  Past Medical History:  Diagnosis Date  . Anxiety   . Bipolar 1 disorder ([place])   . Cancer of abdominal wall   . Depression   . Diabetes mellitus without complication ([place])   . Hypertension   . Obesity   . Obesity   . Polycystic ovarian syndrome 07/01/2011   Patient report  . 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 Schizophrenia [facility])     Past Surgical History:  Procedure Laterality Date  . CHOLECYSTECTOMY    . HERNIA REPAIR    . Ovarian Cyst Excision    . VARICOSE VEIN SURGERY     Family History:  Family History  Problem Relation Age of Onset  . Coronary artery disease Maternal Grandmother   . Diabetes type II Maternal Grandmother   .  Cancer Maternal Grandmother   . Hypertension Mother   . Hypertension Father    Family Psychiatric  History:  Social History:  History  Alcohol Use No     History  Drug Use No    Social History   Social History  . Marital status: Single    Spouse name: N/A  . Number of children: N/A  . Years of education: N/A   Social  History Main Topics  . Smoking status: Never Smoker  . Smokeless tobacco: Never Used  . Alcohol use No  . Drug use: No  . Sexual activity: No   Other Topics Concern  . None   Social History Narrative  . None    Hospital Course:  Tina Saunders was admitted for Cluster B personality disorder [facility])  and crisis management.  Pt was treated discharged with the medications listed below under Medication List.  Medical problems were identified and treated as needed.  Home medications were restarted as appropriate.  Improvement was monitored by observation and Tina Saunders 's daily report of symptom reduction.  Emotional and mental status was monitored by daily self-inventory reports completed by Tina Saunders and clinical staff.         Tina Saunders was evaluated by the treatment team for stability and plans for continued recovery upon discharge. Tina Saunders 's motivation was an integral factor for scheduling further treatment. Employment, transportation, bed availability, health status, family support, and any pending legal issues were also considered during hospital stay. Pt was offered further treatment options upon discharge including but not limited to Residential, Intensive Outpatient, and Outpatient treatment.  Tina Saunders will follow up with the services as listed below under Follow Up Information.     Upon completion of this admission the patient was both mentally and medically stable for discharge denying suicidal/homicidal ideation, auditory/visual/tactile hallucinations, delusional thoughts and paranoia.    Tina Saunders responded well to treatment with Thorazine 50 mg, Buspar 15 mg PO BID and Topramax 100 mg PO BID without adverse effects.  Pt demonstrated improvement without reported or observed adverse effects to the point of stability appropriate for outpatient management. Pertinent labs include: Lipid panel elevated, A1c 10.9 for which outpatient follow-up is necessary for  lab recheck as mentioned below. Reviewed CBC, CMP, BAL, and UDS; all unremarkable aside from noted exceptions.   Physical Findings: AIMS: Facial and Oral Movements Muscles of Facial Expression: None, normal Lips and Perioral Area: None, normal Jaw: None, normal Tongue: None, normal,Extremity Movements Upper (arms, wrists, hands, fingers): None, normal Lower (legs, knees, ankles, toes): None, normal, Trunk Movements Neck, shoulders, hips: None, normal, Overall Severity Severity of abnormal movements (highest score from questions above): None, normal Incapacitation due to abnormal movements: None, normal Patient's awareness of abnormal movements (rate only patient's report): No Awareness, Dental Status Current problems with teeth and/or dentures?: No Does patient usually wear dentures?: No  CIWA:    COWS:     Musculoskeletal: Strength & Muscle Tone: within normal limits Gait & Station: normal Patient leans: N/A  Psychiatric Specialty Exam: See SRA by MD Physical Exam  ROS  Blood pressure 132/75, pulse (!) 102, temperature 97.7 F (36.5 C), resp. rate 18, height 5\' 4"  (1.626 m), weight 108.4 kg (239 lb), last menstrual period 10/10/2016, SpO2 97 %.Body mass index is 41.02 kg/m.  Have you used any form of tobacco in the last 30 days? (Cigarettes, Smokeless Tobacco, Cigars, and/or Pipes): No  Has this patient  used any form of tobacco in the last 30 days? (Cigarettes, Smokeless Tobacco, Cigars, and/or Pipes)  No  Blood Alcohol level:  Lab Results  Component Value Date   ETH <10 10/31/2016   ETH <5 93/81/8299    Metabolic Disorder Labs:  Lab Results  Component Value Date   HGBA1C 10.9 (H) 11/03/2016   MPG 266.13 11/03/2016   Lab Results  Component Value Date   PROLACTIN 10.9 06/13/2006   Lab Results  Component Value Date   CHOL 113 11/03/2016   TRIG 366 (H) 11/03/2016   HDL 19 (L) 11/03/2016   CHOLHDL 5.9 11/03/2016   VLDL 73 (H) 11/03/2016   LDLCALC 21 11/03/2016    LDLCALC 42 04/26/2006    See Psychiatric Specialty Exam and Suicide Risk Assessment completed by Attending Physician prior to discharge.  Discharge destination:  Home  Is patient on multiple antipsychotic therapies at discharge:  No   Has Patient had three or more failed trials of antipsychotic monotherapy by history:  No  Recommended Plan for Multiple Antipsychotic Therapies: NA  Discharge Instructions    Diet - low sodium heart healthy    Complete by:  As directed    Discharge instructions    Complete by:  As directed    Take all medications as prescribed. Keep all follow-up appointments as scheduled.  Do not consume alcohol or use illegal drugs while on prescription medications. Report any adverse effects from your medications to your primary care provider promptly.  In the event of recurrent symptoms or worsening symptoms, call 911, a crisis hotline, or go to the nearest emergency department for evaluation.   Increase activity slowly    Complete by:  As directed      Allergies as of 11/04/2016      Reactions   Fish-derived Products Anaphylaxis   Can only eat FLounder   Geodon [ziprasidone Hcl] Other (See Comments)   Face pulls, cant swallow - Locked Jaw   Haldol [haloperidol Lactate] Other (See Comments)   Face pulls, can't swallow - Locked Jaw   Buprenorphine Hcl Hives, Itching, Rash, Other (See Comments)   GI upset   Compazine [prochlorperazine] Other (See Comments)   anxiety and hyperactivity   Morphine And Related Hives, Itching, Rash, Other (See Comments)   GI upset   Toradol [ketorolac Tromethamine] Other (See Comments)   Anxiety and hyperactivity      Medication List    STOP taking these medications   LORazepam 0.5 MG tablet Commonly known as:  ATIVAN   NOVOLOG FLEXPEN 100 UNIT/ML FlexPen Generic drug:  insulin aspart     TAKE these medications     Indication  busPIRone 15 MG tablet Commonly known as:  BUSPAR Take 1 tablet (15 mg total) by mouth 3  (three) times daily.  Indication:  Anxiety Disorder   chlorproMAZINE 50 MG tablet Commonly known as:  THORAZINE Take 1 tablet (50 mg total) by mouth 2 (two) times daily. What changed:  medication strength  when to take this  Indication:  Manic-Depression   cloNIDine 0.1 MG tablet Commonly known as:  CATAPRES Take 1 tablet (0.1 mg total) by mouth 2 (two) times daily.  Indication:  High Blood Pressure Disorder   fenofibrate 160 MG tablet Take 1 tablet (160 mg total) by mouth daily. What changed:  medication strength  how much to take  Indication:  High Amount of Triglycerides in the Blood   gabapentin 400 MG capsule Commonly known as:  NEURONTIN Take 1 capsule (400  mg total) by mouth 3 (three) times daily.  Indication:  Aggressive Behavior   insulin detemir 100 UNIT/ML injection Commonly known as:  LEVEMIR Inject 0.7 mLs (70 Units total) into the skin 2 (two) times daily. What changed:  how much to take  Indication:  Type 2 Diabetes   lactulose 10 GM/15ML solution Commonly known as:  CHRONULAC Take 20 g by mouth 2 (two) times daily.  Indication:  Chronic Constipation   lisinopril 10 MG tablet Commonly known as:  PRINIVIL,ZESTRIL Take 10 mg by mouth daily.  Indication:  Severe Hypertension Requiring Emergent Treatment   QUEtiapine 400 MG tablet Commonly known as:  SEROQUEL Take 2 tablets (800 mg total) by mouth at bedtime. What changed:  Another medication with the same name was removed. Continue taking this medication, and follow the directions you see here.  Indication:  Obsessive Compulsive Disorder   sertraline 100 MG tablet Commonly known as:  ZOLOFT Take 2 tablets (200 mg total) by mouth every morning.  Indication:  Major Depressive Disorder   simvastatin 10 MG tablet Commonly known as:  ZOCOR Take 1 tablet (10 mg total) by mouth at bedtime.  Indication:  High Amount of Triglycerides in the Blood   topiramate 100 MG tablet Commonly known as:   TOPAMAX Take 1 tablet (100 mg total) by mouth 2 (two) times daily.  Indication:  Binge Eating Disorder   traZODone 50 MG tablet Commonly known as:  DESYREL Take 1 tablet (50 mg total) by mouth at bedtime as needed for sleep. What changed:  how much to take  when to take this  reasons to take this  additional instructions  Indication:  Trouble Sleeping   Vitamin D (Ergocalciferol) 50000 units Caps capsule Commonly known as:  DRISDOL Take 50,000 Units by mouth every Thursday.  Indication:  Vitamin D Deficiency      Follow-up Information    [facility] Follow up on 11/07/2016.   Specialty:  Behavioral Health Why:  The [facility] ACT team is planning on working with you.  Call Lauren at [phone] to set up your next appointment with them. Contact information: [place] [address] [phone]           Follow-up recommendations:  Activity:  as tolerated Diet:  heart healthy  Comments:  Take all medications as prescribed. Keep all follow-up appointments as scheduled.  Do not consume alcohol or use illegal drugs while on prescription medications. Report any adverse effects from your medications to your primary care provider promptly.  In the event of recurrent symptoms or worsening symptoms, call 911, a crisis hotline, or go to the nearest emergency department for evaluation.   Signed: Derrill Center, NP 11/04/2016, 11:20 AM

## 2016-11-04 NOTE — Progress Notes (Signed)
Pt screaming in his room, when asked if pt was ok he responded "I'm just getting some anger out"

## 2016-11-04 NOTE — Progress Notes (Signed)
  [facility] Adult Case Management Discharge Plan :  Will you be returning to the same living situation after discharge:  Yes,  home alone At discharge, do you have transportation home?: Yes,  cab voucher provided Do you have the ability to pay for your medications: Yes,  insurance and income  Release of information consent forms completed and turned in to Medical Records.  Patient to Follow up at: Follow-up Information    [facility] Follow up on 11/07/2016.   Specialty:  Behavioral Health Why:  The [facility] ACT team is planning on working with you.  Call Lauren at [phone] to set up your next appointment with them. Contact information: [place] [address] [phone]           Next level of care provider has access to [place] and Suicide Prevention discussed: No.  Refused  Have you used any form of tobacco in the last 30 days? (Cigarettes, Smokeless Tobacco, Cigars, and/or Pipes): No  Has patient been referred to the Quitline?: N/A patient is not a smoker  Patient has been referred for addiction treatment: N/A  Maretta Los, LCSW 11/04/2016, 1:06 PM

## 2016-11-04 NOTE — BHH Suicide Risk Assessment (Signed)
[facility] Discharge Suicide Risk Assessment   Principal Problem: Cluster B personality disorder [facility]) Discharge Diagnoses:  Patient Active Problem List   Diagnosis Date Noted  . Uncontrolled diabetes mellitus ([place]) [E11.65] 11/03/2016  . Schizoaffective disorder, bipolar type ([place]) [F25.0] 11/02/2016  . Cluster B personality disorder ([place]) [F60.9] 11/02/2016  . Schizoaffective disorder, mixed type ([employer]) [F25.0] 05/30/2014  . Suicidal ideation [R45.851]   . 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 Headache [R51] 04/15/2013  . HTN (hypertension) [I10] 04/15/2013  . Post traumatic stress disorder [F43.10] 12/07/2011  . CAP (community acquired pneumonia) [J18.9] 08/27/2011  . Chest pain [R07.9] 08/26/2011  . SOB (shortness of breath) [R06.02] 08/26/2011  . Fever [R50.9] 08/26/2011  . Hypokalemia [E87.6] 08/26/2011  . PSVT (paroxysmal supraventricular tachycardia) ([place]) [I47.1] 08/26/2011  . ADHD [F90.9] 09/23/2007  . EPIGASTRIC PAIN [R10.13] 09/23/2007  . Obesity, unspecified [E66.9] 07/30/2007  . DEPRESSION [F32.9] 07/30/2007  . SLEEP DISORDER [G47.9] 07/30/2007  . IMPAIRED FASTING GLUCOSE [R73.01] 07/30/2007  . FATIGUE [R53.81, R53.83] 11/21/2006  . ABNORMAL FINDINGS, ELEVATED BP W/O HTN [R03.0] 11/21/2006  . METRORRHAGIA [N92.1] 06/13/2006  . DISORDER, MENSTRUAL NEC [N94.9] 06/13/2006  . DIZZINESS [R42] 06/13/2006  . POLYCYSTIC OVARIAN DISEASE [E28.2] 04/25/2006  . AMENORRHEA, SECONDARY [N91.2] 04/20/2006  . ACNE, MILD [L70.8] 04/20/2006  . ABDOMINAL PAIN [R10.9] 04/20/2006    Total Time spent with patient: 30 minutes  Musculoskeletal: Strength & Muscle Tone: within normal limits Gait & Station: normal Patient leans: N/A  Psychiatric Specialty Exam: Review of Systems  Constitutional: Negative.   HENT: Negative.   Eyes: Negative.   Respiratory: Negative.   Cardiovascular: Negative.   Gastrointestinal: Negative.   Genitourinary: Negative.   Musculoskeletal:  Negative.   Skin: Negative.   Neurological: Negative.   Endo/Heme/Allergies: Negative.   Psychiatric/Behavioral: Negative for depression, hallucinations, memory loss, substance abuse and suicidal ideas. The patient is not nervous/anxious and does not have insomnia.     Blood pressure 132/75, pulse (!) 102, temperature 97.7 F (36.5 C), resp. rate 18, height 5\' 4"  (1.626 m), weight 108.4 kg (239 lb), last menstrual period 10/10/2016, SpO2 97 %.Body mass index is 41.02 kg/m.  General Appearance:  Casually dressed, calm and cooperative. Appropriate behavior. Not internally distracted. No EPS  Eye Contact::  Good  Speech:  Spontaneous, normal prosody. Normal tone and rate.   Volume:  Normal  Mood:  Euthymic  Affect:  Appropriate and Full Range  Thought Process:  Linear  Orientation:  Full (Time, Place, and Person)  Thought Content:  No delusional theme. No preoccupation with violent thoughts. No negative ruminations. No obsession.  No hallucination in any modality.   Suicidal Thoughts:  No  Homicidal Thoughts:  No  Memory:  Immediate;   Good Recent;   Good Remote;   Good  Judgement:  Good  Insight:  Good  Psychomotor Activity:  Normal  Concentration:  Good  Recall:  Good  Fund of Knowledge:Good  Language: Good  Akathisia:  Negative  Handed:    AIMS (if indicated):     Assets:  Communication Skills Desire for Improvement Housing Resilience Social Support  Sleep:  Number of Hours: 6.25  Cognition: WNL  ADL's:  Intact   Clinical Assessment::   31 y.o Caucasian female, single, lives alone, disabled. Background history of Schizoaffective disorder, early life trauma and IDDM. Presented to the ER via the police. Called for help herself because she was having suicidal thoughts. Also reported visual hallucinations of a man giving her commands to  hurt herself. Routine labs shows probable UTI. Patient reports treatment with [facility] a week ago.  UDS and  BAL are negative. Reports past  impulsive behavior. Repeated self harming behavior.  Seen today. States that she is feeling good. She has been in contact with her mom. Her mom is mediating on her relationship with her brother. Patient is positive things would improve between them. She has not had any hallucination since she has been here. She is no longer having any suicidal thoughts. No hallucination in any modality. No dissociation. No delusional preoccupation. No evidence of depression. No evidence of mania. No anxiety or PTSD symptoms. No homicidal thoughts. No thoughts of violence. No access to weapons.  Patient is linked to ACTT. She has a therapist. She is tolerating her medications well.  I spoke to her mother Jeannene Patella on [phone]. She states that patient is back to her normal self. She confirmed patient's report about the family dynamics.  Mother has no concerns about safety. Mother would pick her up.  Nursing staff reports that patient has been appropriate on the unit. Patient has been interacting well with peers. No behavioral issues. Patient has not voiced any suicidal thoughts. Patient has not been observed to be internally stimulated. Patient has been adherent with treatment recommendations. Patient has been tolerating their medication well.   Patient was discussed at team. Team members feels that patient is back to her baseline level of function. Team agrees with plan to discharge patient today.  Demographic Factors:  Living alone  Loss Factors: NA  Historical Factors: Prior suicide attempts and Impulsivity  Risk Reduction Factors:   Sense of responsibility to family, Positive social support, Positive therapeutic relationship and Positive coping skills or problem solving skills  Continued Clinical Symptoms:   as above   Cognitive Features That Contribute To Risk:  None    Suicide Risk:  Minimal: No identifiable suicidal ideation.  Patient is not having any thoughts of suicide at this time. Modifiable risk  factors targeted during this admission includes depression and mood swings. Demographical and historical risk factors cannot be modified. Patient is now engaging well. Patient is reliable and is future oriented. We have buffered patient's support structures. At this point, patient is at low risk of suicide. Patient is aware of the effects of psychoactive substances on decision making process. Patient has been provided with emergency contacts. Patient acknowledges to use resources provided if unforseen circumstances changes their current risk stratification.    Follow-up Information    [facility] Follow up on 11/07/2016.   Specialty:  Behavioral Health Why:  The [facility] ACT team is planning on working with you.  Call Lauren at [phone] to set up your next appointment with them. Contact information: [place] [address] [phone]           Plan Of Care/Follow-up recommendations:  1. Continue current psychotropic medications 2. Mental health and addiction follow up as arranged.  3. Discharge in care of her mother 4. Provided limited quantity of prescriptions   [place], MD 11/04/2016, 10:51 AM

## 2016-11-06 LAB — GLUCOSE, CAPILLARY: Glucose-Capillary: 351 mg/dL — ABNORMAL HIGH (ref 65–99)

## 2016-11-08 DIAGNOSIS — E1110 Type 2 diabetes mellitus with ketoacidosis without coma: Secondary | ICD-10-CM | POA: Diagnosis not present

## 2016-11-08 DIAGNOSIS — R569 Unspecified convulsions: Secondary | ICD-10-CM | POA: Diagnosis not present

## 2016-11-08 DIAGNOSIS — F329 Major depressive disorder, single episode, unspecified: Secondary | ICD-10-CM | POA: Diagnosis not present

## 2016-11-08 DIAGNOSIS — D61818 Other pancytopenia: Secondary | ICD-10-CM | POA: Diagnosis not present

## 2016-11-08 DIAGNOSIS — E162 Hypoglycemia, unspecified: Secondary | ICD-10-CM | POA: Diagnosis not present

## 2016-11-08 DIAGNOSIS — Z794 Long term (current) use of insulin: Secondary | ICD-10-CM | POA: Diagnosis not present

## 2016-11-08 DIAGNOSIS — I10 Essential (primary) hypertension: Secondary | ICD-10-CM | POA: Diagnosis not present

## 2016-11-08 DIAGNOSIS — Z9119 Patient's noncompliance with other medical treatment and regimen: Secondary | ICD-10-CM | POA: Diagnosis not present

## 2016-11-08 DIAGNOSIS — F209 Schizophrenia, unspecified: Secondary | ICD-10-CM | POA: Diagnosis not present

## 2016-11-08 DIAGNOSIS — G4089 Other seizures: Secondary | ICD-10-CM | POA: Diagnosis not present

## 2016-11-08 DIAGNOSIS — E1310 Other specified diabetes mellitus with ketoacidosis without coma: Secondary | ICD-10-CM | POA: Diagnosis not present

## 2016-11-08 DIAGNOSIS — E1010 Type 1 diabetes mellitus with ketoacidosis without coma: Secondary | ICD-10-CM | POA: Diagnosis not present

## 2016-11-08 DIAGNOSIS — R5601 Complex febrile convulsions: Secondary | ICD-10-CM | POA: Diagnosis not present

## 2016-11-08 DIAGNOSIS — F79 Unspecified intellectual disabilities: Secondary | ICD-10-CM | POA: Diagnosis not present

## 2016-11-08 DIAGNOSIS — Z9114 Patient's other noncompliance with medication regimen: Secondary | ICD-10-CM | POA: Diagnosis not present

## 2016-11-08 DIAGNOSIS — E1165 Type 2 diabetes mellitus with hyperglycemia: Secondary | ICD-10-CM | POA: Diagnosis not present

## 2016-11-08 DIAGNOSIS — E119 Type 2 diabetes mellitus without complications: Secondary | ICD-10-CM | POA: Diagnosis not present

## 2016-11-08 DIAGNOSIS — E1065 Type 1 diabetes mellitus with hyperglycemia: Secondary | ICD-10-CM | POA: Diagnosis not present

## 2016-11-08 DIAGNOSIS — E7800 Pure hypercholesterolemia, unspecified: Secondary | ICD-10-CM | POA: Diagnosis not present

## 2016-11-09 DIAGNOSIS — F329 Major depressive disorder, single episode, unspecified: Secondary | ICD-10-CM | POA: Diagnosis not present

## 2016-11-09 DIAGNOSIS — E1310 Other specified diabetes mellitus with ketoacidosis without coma: Secondary | ICD-10-CM | POA: Diagnosis not present

## 2016-11-09 DIAGNOSIS — F209 Schizophrenia, unspecified: Secondary | ICD-10-CM | POA: Diagnosis not present

## 2016-11-09 DIAGNOSIS — F79 Unspecified intellectual disabilities: Secondary | ICD-10-CM | POA: Diagnosis not present

## 2016-11-10 DIAGNOSIS — E119 Type 2 diabetes mellitus without complications: Secondary | ICD-10-CM | POA: Diagnosis not present

## 2016-11-10 DIAGNOSIS — Z9114 Patient's other noncompliance with medication regimen: Secondary | ICD-10-CM | POA: Diagnosis not present

## 2016-11-10 DIAGNOSIS — E1165 Type 2 diabetes mellitus with hyperglycemia: Secondary | ICD-10-CM | POA: Diagnosis not present

## 2016-11-10 DIAGNOSIS — R569 Unspecified convulsions: Secondary | ICD-10-CM | POA: Diagnosis not present

## 2016-11-10 DIAGNOSIS — Z9119 Patient's noncompliance with other medical treatment and regimen: Secondary | ICD-10-CM | POA: Diagnosis not present

## 2016-11-17 DIAGNOSIS — E119 Type 2 diabetes mellitus without complications: Secondary | ICD-10-CM | POA: Diagnosis not present

## 2016-11-17 DIAGNOSIS — E785 Hyperlipidemia, unspecified: Secondary | ICD-10-CM | POA: Diagnosis not present

## 2016-11-17 DIAGNOSIS — I10 Essential (primary) hypertension: Secondary | ICD-10-CM | POA: Diagnosis not present

## 2016-11-20 DIAGNOSIS — E138 Other specified diabetes mellitus with unspecified complications: Secondary | ICD-10-CM | POA: Diagnosis not present

## 2016-11-20 DIAGNOSIS — I10 Essential (primary) hypertension: Secondary | ICD-10-CM | POA: Diagnosis not present

## 2016-11-20 DIAGNOSIS — G4089 Other seizures: Secondary | ICD-10-CM | POA: Diagnosis not present

## 2016-11-20 DIAGNOSIS — F259 Schizoaffective disorder, unspecified: Secondary | ICD-10-CM | POA: Diagnosis not present

## 2016-11-21 ENCOUNTER — Encounter (HOSPITAL_COMMUNITY): Payer: Self-pay | Admitting: Emergency Medicine

## 2016-11-21 ENCOUNTER — Emergency Department (HOSPITAL_COMMUNITY): Payer: Medicare HMO

## 2016-11-21 ENCOUNTER — Emergency Department (HOSPITAL_COMMUNITY)
Admission: EM | Admit: 2016-11-21 | Discharge: 2016-11-22 | Disposition: A | Discharge status: Home / Self Care | Payer: Medicare HMO | Attending: Emergency Medicine | Admitting: Emergency Medicine

## 2016-11-21 ENCOUNTER — Ambulatory Visit: Payer: Self-pay | Admitting: Gastroenterology

## 2016-11-21 DIAGNOSIS — R1011 Right upper quadrant pain: Secondary | ICD-10-CM | POA: Diagnosis not present

## 2016-11-21 DIAGNOSIS — Z794 Long term (current) use of insulin: Secondary | ICD-10-CM | POA: Diagnosis not present

## 2016-11-21 DIAGNOSIS — Z79899 Other long term (current) drug therapy: Secondary | ICD-10-CM | POA: Insufficient documentation

## 2016-11-21 DIAGNOSIS — Z85028 Personal history of other malignant neoplasm of stomach: Secondary | ICD-10-CM | POA: Diagnosis not present

## 2016-11-21 DIAGNOSIS — R569 Unspecified convulsions: Secondary | ICD-10-CM | POA: Insufficient documentation

## 2016-11-21 DIAGNOSIS — I10 Essential (primary) hypertension: Secondary | ICD-10-CM | POA: Insufficient documentation

## 2016-11-21 DIAGNOSIS — R110 Nausea: Secondary | ICD-10-CM | POA: Insufficient documentation

## 2016-11-21 DIAGNOSIS — E119 Type 2 diabetes mellitus without complications: Secondary | ICD-10-CM | POA: Insufficient documentation

## 2016-11-21 DIAGNOSIS — K2970 Gastritis, unspecified, without bleeding: Secondary | ICD-10-CM | POA: Diagnosis not present

## 2016-11-21 DIAGNOSIS — R10813 Right lower quadrant abdominal tenderness: Secondary | ICD-10-CM | POA: Diagnosis not present

## 2016-11-21 DIAGNOSIS — R1031 Right lower quadrant pain: Secondary | ICD-10-CM | POA: Diagnosis not present

## 2016-11-21 DIAGNOSIS — R109 Unspecified abdominal pain: Secondary | ICD-10-CM | POA: Diagnosis not present

## 2016-11-21 LAB — CBC WITH DIFFERENTIAL/PLATELET
BASOS ABS: 0 10*3/uL (ref 0.0–0.1)
Basophils Relative: 0 %
Eosinophils Absolute: 0.1 10*3/uL (ref 0.0–0.7)
Eosinophils Relative: 2 %
HEMATOCRIT: 35.1 % — AB (ref 36.0–46.0)
Hemoglobin: 11.1 g/dL — ABNORMAL LOW (ref 12.0–15.0)
LYMPHS ABS: 1.1 10*3/uL (ref 0.7–4.0)
LYMPHS PCT: 25 %
MCH: 26.1 pg (ref 26.0–34.0)
MCHC: 31.6 g/dL (ref 30.0–36.0)
MCV: 82.6 fL (ref 78.0–100.0)
MONO ABS: 0.3 10*3/uL (ref 0.1–1.0)
Monocytes Relative: 6 %
NEUTROS ABS: 2.7 10*3/uL (ref 1.7–7.7)
Neutrophils Relative %: 67 %
Platelets: 109 10*3/uL — ABNORMAL LOW (ref 150–400)
RBC: 4.25 MIL/uL (ref 3.87–5.11)
RDW: 15.5 % (ref 11.5–15.5)
WBC: 4.2 10*3/uL (ref 4.0–10.5)

## 2016-11-21 LAB — URINALYSIS, ROUTINE W REFLEX MICROSCOPIC
Bilirubin Urine: NEGATIVE
Hgb urine dipstick: NEGATIVE
Ketones, ur: NEGATIVE mg/dL
NITRITE: NEGATIVE
PROTEIN: NEGATIVE mg/dL
Specific Gravity, Urine: 1.018 (ref 1.005–1.030)
pH: 5 (ref 5.0–8.0)

## 2016-11-21 LAB — LIPASE, BLOOD: LIPASE: 36 U/L (ref 11–51)

## 2016-11-21 LAB — COMPREHENSIVE METABOLIC PANEL
ALT: 24 U/L (ref 14–54)
AST: 42 U/L — AB (ref 15–41)
Albumin: 4.2 g/dL (ref 3.5–5.0)
Alkaline Phosphatase: 84 U/L (ref 38–126)
Anion gap: 12 (ref 5–15)
BILIRUBIN TOTAL: 0.5 mg/dL (ref 0.3–1.2)
BUN: 17 mg/dL (ref 6–20)
CO2: 20 mmol/L — ABNORMAL LOW (ref 22–32)
Calcium: 9 mg/dL (ref 8.9–10.3)
Chloride: 100 mmol/L — ABNORMAL LOW (ref 101–111)
Creatinine, Ser: 0.61 mg/dL (ref 0.44–1.00)
Glucose, Bld: 393 mg/dL — ABNORMAL HIGH (ref 65–99)
POTASSIUM: 4 mmol/L (ref 3.5–5.1)
Sodium: 132 mmol/L — ABNORMAL LOW (ref 135–145)
TOTAL PROTEIN: 7.8 g/dL (ref 6.5–8.1)

## 2016-11-21 LAB — PREGNANCY, URINE: PREG TEST UR: NEGATIVE

## 2016-11-21 LAB — CBG MONITORING, ED: GLUCOSE-CAPILLARY: 385 mg/dL — AB (ref 65–99)

## 2016-11-21 MED ORDER — HYDROMORPHONE HCL 1 MG/ML IJ SOLN
1.0000 mg | Freq: Once | INTRAMUSCULAR | Status: AC
  Administered 2016-11-21: 1 mg via INTRAVENOUS
  Filled 2016-11-21: qty 1

## 2016-11-21 MED ORDER — LEVETIRACETAM 500 MG PO TABS: 500.0000 mg | ORAL_TABLET | Freq: Two times a day (BID) | ORAL | 0 refills | Status: DC

## 2016-11-21 MED ORDER — FENTANYL CITRATE (PF) 100 MCG/2ML IJ SOLN
100.0000 ug | Freq: Once | INTRAMUSCULAR | Status: AC
  Administered 2016-11-21: 100 ug via INTRAVENOUS
  Filled 2016-11-21: qty 2

## 2016-11-21 MED ORDER — SODIUM CHLORIDE 0.9 % IV BOLUS (SEPSIS)
1000.0000 mL | Freq: Once | INTRAVENOUS | Status: AC
Start: 2016-11-21 — End: 2016-11-22
  Administered 2016-11-21: 1000 mL via INTRAVENOUS

## 2016-11-21 NOTE — ED Triage Notes (Signed)
Per EMS, patient from home, c/o RLQ pain since 0800 today. Tender to palpation. Reports nausea, denies V/D. Ambulatory.  BP 132/82 HR 94 RR 16 O2 99% CBG 360

## 2016-11-21 NOTE — Discharge Instructions (Signed)
Return to the ER immediately or see your doctor in 24-48 hours if your abdominal doesn't improve, or if it worsens or you develop fever, vomiting or other new/concerning symptoms. Follow up with your neurologist for the seizures.

## 2016-11-21 NOTE — ED Provider Notes (Signed)
[facility] DEPT Provider Note   CSN: [id number] Arrival date & time: 11/21/16  1828     History   Chief Complaint Chief Complaint  Patient presents with  . Abdominal Pain  . Seizures    HPI Tina Saunders is a 31 y.o. female.  HPI 31 year old female with a history of bipolar disorder, diabetes, and polycystic ovarian syndrome presents with acute right lower quadrant abdominal pain.  States it started this morning at 8 AM has progressively worsened.  It was a 3 out of 10 at first and now is an 8 out of 10.  Ibuprofen and Tylenol have not helped.  She denies any hematuria, dysuria, or urinary frequency.  No vaginal bleeding or discharge.  She states that she has had pain in this area before but never this bad.  No fevers during this time.  She has had nausea but no vomiting.  No diarrhea or constipation.  The pain wraps around to her back. Patient has never been sexually active.  Past Medical History:  Diagnosis Date  . Anxiety   . Bipolar 1 disorder ([place])   . Cancer of abdominal wall   . Depression   . Diabetes mellitus without complication ([place])   . Hypertension   . Obesity   . Obesity   . Polycystic ovarian syndrome 07/01/2011   Patient report  . 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 Schizophrenia [facility])     Patient Active Problem List   Diagnosis Date Noted  . Uncontrolled diabetes mellitus ([place]) 11/03/2016  . Schizoaffective disorder, bipolar type ([place]) 11/02/2016  . Cluster B personality disorder ([place]) 11/02/2016  . Schizoaffective disorder, mixed type ([place]) 05/30/2014  . Suicidal ideation   . 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 Headache 04/15/2013  . HTN (hypertension) 04/15/2013  . Post traumatic stress disorder 12/07/2011  . CAP (community acquired pneumonia) 08/27/2011  . Chest pain 08/26/2011  . SOB (shortness of breath) 08/26/2011  . Fever 08/26/2011  . Hypokalemia 08/26/2011  . PSVT (paroxysmal supraventricular tachycardia) ([place])  08/26/2011  . ADHD 09/23/2007  . EPIGASTRIC PAIN 09/23/2007  . Obesity, unspecified 07/30/2007  . DEPRESSION 07/30/2007  . SLEEP DISORDER 07/30/2007  . IMPAIRED FASTING GLUCOSE 07/30/2007  . FATIGUE 11/21/2006  . ABNORMAL FINDINGS, ELEVATED BP W/O HTN 11/21/2006  . METRORRHAGIA 06/13/2006  . DISORDER, MENSTRUAL NEC 06/13/2006  . DIZZINESS 06/13/2006  . POLYCYSTIC OVARIAN DISEASE 04/25/2006  . AMENORRHEA, SECONDARY 04/20/2006  . ACNE, MILD 04/20/2006  . ABDOMINAL PAIN 04/20/2006    Past Surgical History:  Procedure Laterality Date  . CHOLECYSTECTOMY    . HERNIA REPAIR    . Ovarian Cyst Excision    . VARICOSE VEIN SURGERY      OB History    Gravida Para Term Preterm AB Living   0             SAB TAB Ectopic Multiple Live Births                   Home Medications    Prior to Admission medications   Medication Sig Start Date End Date Taking? Authorizing Provider  busPIRone (BUSPAR) 15 MG tablet Take 1 tablet (15 mg total) by mouth 3 (three) times daily. 11/04/16  Yes Derrill Center, NP  chlorproMAZINE (THORAZINE) 50 MG tablet Take 1 tablet (50 mg total) by mouth 2 (two) times daily. Patient taking differently: Take 50 mg by mouth 3 (three) times daily.  11/04/16  Yes Derrill Center,  NP  cloNIDine (CATAPRES) 0.1 MG tablet Take 1 tablet (0.1 mg total) by mouth 2 (two) times daily. 11/04/16  Yes Derrill Center, NP  cyclobenzaprine (FLEXERIL) 5 MG tablet Take 5 mg by mouth 2 (two) times daily as needed for muscle spasms.   Yes [provider]  dicyclomine (BENTYL) 20 MG tablet Take 20 mg by mouth every 8 (eight) hours as needed for spasms.   Yes [provider]  fenofibrate (TRICOR) 145 MG tablet Take 145 mg by mouth daily.   Yes [provider]  gabapentin (NEURONTIN) 400 MG capsule Take 1 capsule (400 mg total) by mouth 3 (three) times daily. 11/04/16  Yes Derrill Center, NP  insulin detemir (LEVEMIR) 100 UNIT/ML injection Inject 0.7 mLs (70  Units total) into the skin 2 (two) times daily. Patient taking differently: Inject 68 Units into the skin 2 (two) times daily.  11/04/16  Yes Derrill Center, NP  lisinopril (PRINIVIL,ZESTRIL) 10 MG tablet Take 10 mg by mouth daily.   Yes [provider]  pantoprazole (PROTONIX) 40 MG tablet Take 40 mg by mouth daily.   Yes [provider]  QUEtiapine (SEROQUEL) 100 MG tablet Take 100 mg by mouth daily.   Yes [provider]  QUEtiapine (SEROQUEL) 200 MG tablet Take 500 mg by mouth at bedtime.   Yes [provider]  sertraline (ZOLOFT) 100 MG tablet Take 2 tablets (200 mg total) by mouth every morning. 11/05/16  Yes Derrill Center, NP  simvastatin (ZOCOR) 10 MG tablet Take 1 tablet (10 mg total) by mouth at bedtime. 11/04/16  Yes Derrill Center, NP  topiramate (TOPAMAX) 100 MG tablet Take 1 tablet (100 mg total) by mouth 2 (two) times daily. 11/04/16  Yes Derrill Center, NP  traZODone (DESYREL) 50 MG tablet Take 1 tablet (50 mg total) by mouth at bedtime as needed for sleep. Patient taking differently: Take 25 mg by mouth 2 (two) times daily.  11/04/16  Yes Derrill Center, NP  fenofibrate 160 MG tablet Take 1 tablet (160 mg total) by mouth daily. Patient not taking: Reported on 11/21/2016 11/05/16   Derrill Center, NP  levETIRAcetam (KEPPRA) 500 MG tablet Take 1 tablet (500 mg total) by mouth 2 (two) times daily. 11/21/16   Sherwood Gambler, MD  QUEtiapine (SEROQUEL) 400 MG tablet Take 2 tablets (800 mg total) by mouth at bedtime. Patient not taking: Reported on 11/21/2016 11/04/16   Derrill Center, NP    Family History Family History  Problem Relation Age of Onset  . Coronary artery disease Maternal Grandmother   . Diabetes type II Maternal Grandmother   . Cancer Maternal Grandmother   . Hypertension Mother   . Hypertension Father     Social History Social History  Substance Use Topics  . Smoking status: Never Smoker  . Smokeless tobacco:  Never Used  . Alcohol use No     Allergies   Fish-derived products; Geodon [ziprasidone hcl]; Haldol [haloperidol lactate]; Buprenorphine hcl; Compazine [prochlorperazine]; Morphine and related; and Toradol [ketorolac tromethamine]   Review of Systems Review of Systems  Constitutional: Negative for fever.  Gastrointestinal: Positive for abdominal pain and nausea. Negative for constipation, diarrhea and vomiting.  Genitourinary: Negative for dysuria, frequency and hematuria.  Musculoskeletal: Positive for back pain.  All other systems reviewed and are negative.    Physical Exam Updated Vital Signs BP 113/71   Pulse 86   Temp (!) 97.5 F (36.4 C) (Oral)  Resp 18   SpO2 92%   Physical Exam  Constitutional: She is oriented to person, place, and time. She appears well-developed and well-nourished. No distress.  obese  HENT:  Head: Normocephalic and atraumatic.  Right Ear: External ear normal.  Left Ear: External ear normal.  Nose: Nose normal.  Eyes: Right eye exhibits no discharge. Left eye exhibits no discharge.  Cardiovascular: Normal rate, regular rhythm and normal heart sounds.   Pulmonary/Chest: Effort normal and breath sounds normal.  Abdominal: Soft. There is tenderness in the right lower quadrant and suprapubic area. There is CVA tenderness (right).  Neurological: She is alert and oriented to person, place, and time.  Skin: Skin is warm and dry. She is not diaphoretic.  Nursing note and vitals reviewed.    ED Treatments / Results  Labs (all labs ordered are listed, but only abnormal results are displayed) Labs Reviewed  COMPREHENSIVE METABOLIC PANEL - Abnormal; Notable for the following:       Result Value   Sodium 132 (*)    Chloride 100 (*)    CO2 20 (*)    Glucose, Bld 393 (*)    AST 42 (*)    All other components within normal limits  CBC WITH DIFFERENTIAL/PLATELET - Abnormal; Notable for the following:    Hemoglobin 11.1 (*)    HCT 35.1 (*)     Platelets 109 (*)    All other components within normal limits  URINALYSIS, ROUTINE W REFLEX MICROSCOPIC - Abnormal; Notable for the following:    APPearance HAZY (*)    Glucose, UA >=500 (*)    Leukocytes, UA SMALL (*)    Bacteria, UA RARE (*)    Squamous Epithelial / LPF 0-5 (*)    All other components within normal limits  CBG MONITORING, ED - Abnormal; Notable for the following:    Glucose-Capillary 385 (*)    All other components within normal limits  URINE CULTURE  LIPASE, BLOOD  PREGNANCY, URINE  POC URINE PREG, ED    EKG  EKG Interpretation  Date/Time:  Tuesday November 21 2016 19:15:53 EDT Ventricular Rate:  108 PR Interval:    QRS Duration: 91 QT Interval:  368 QTC Calculation: 494 R Axis:   -15 Text Interpretation:  Sinus tachycardia Borderline left axis deviation Low voltage, precordial leads Abnormal Q suggests anterior infarct Borderline prolonged QT interval no significant change since Nov 01 2016 Confirmed by Sherwood Gambler [phone]) on 11/21/2016 7:36:11 PM       Radiology US Pelvis Complete  Result Date: 11/21/2016 CLINICAL DATA:  Right flank pain. EXAM: TRANSABDOMINAL ULTRASOUND OF PELVIS TECHNIQUE: Transabdominal ultrasound examination of the pelvis was performed including evaluation of the uterus, ovaries, adnexal regions, and pelvic cul-de-sac. COMPARISON:  CT and ultrasound 10/26/2016 FINDINGS: Uterus Measurements: 5.1 x 2.3 x 3.4 cm. No gross fibroid or focal lesion, limited evaluation. Endometrium Not well-defined. Right ovary Not visualized. Left ovary Not visualized. Other findings:  No abnormal free fluid. IMPRESSION: Habitus limited exam. Uterus tentatively identified and normal in size. Ovaries are not visualized. Electronically Signed   By: Jeb Levering M.D.   On: 11/21/2016 22:14   US Renal  Result Date: 11/21/2016 CLINICAL DATA:  Right flank pain EXAM: RENAL / URINARY TRACT ULTRASOUND COMPLETE COMPARISON:  CT abdomen pelvis 10/26/2016  FINDINGS: Examination sensitivity is degraded by patient body habitus. Right Kidney: Length: 12.5 cm. Echogenicity within normal limits. No mass or hydronephrosis visualized. Left Kidney: Length: 11.5 cm. Echogenicity within normal limits. No mass or  hydronephrosis visualized. Bladder: Appears normal for degree of bladder distention. IMPRESSION: Normal renal ultrasound within the context of reduced sensitivity secondary to patient body habitus. Electronically Signed   By: Ulyses Jarred M.D.   On: 11/21/2016 22:18    Procedures Procedures (including critical care time)  Medications Ordered in ED Medications  sodium chloride 0.9 % bolus 1,000 mL (1,000 mLs Intravenous New Bag/Given 11/21/16 2005)  fentaNYL (SUBLIMAZE) injection 100 mcg (100 mcg Intravenous Given 11/21/16 2028)  HYDROmorphone (DILAUDID) injection 1 mg (1 mg Intravenous Given 11/21/16 2111)     Initial Impression / Assessment and Plan / ED Course  I have reviewed the triage vital signs and the nursing notes.  Pertinent labs & imaging results that were available during my care of the patient were reviewed by me and considered in my medical decision making (see chart for details).     Chart review shows that patient has come in with right lower quadrant pain to here or outside hospitals many times.  Negative CT scans.  At this point, while appendicitis still has to be in the differential I think this is less likely given no vomiting, benign vital signs, normal WBC and no fever.  I discussed this with patient and I am concerned about how many CT scans he has had.  Given low suspicion of appendicitis we have decided to hold off on CT but did discuss if any pain worsens or she develops fevers, vomiting, etc. to come back and this will be considered.  She did possibly have a seizure in the waiting room but I did not witness this.  She states she has been having seizures almost every day since discharge from [place].  There discharge  summary indicates she had a negative CT and was not discharged on meds.  I discussed this with her and she prefers to go on meds and then follow-up with her neurologist.  Otherwise, she has no GU complaints.  Urine will be sent for culture but is not indicative of UTI, especially without symptoms.  No hydronephrosis to suggest kidney stone.  Has hyperglycemia but no elevated anion gap to suggest DKA.  Discharge home with return precautions.  Final Clinical Impressions(s) / ED Diagnoses   Final diagnoses:  RLQ abdominal pain  Seizure (HCC)    New Prescriptions New Prescriptions   LEVETIRACETAM (KEPPRA) 500 MG TABLET    Take 1 tablet (500 mg total) by mouth 2 (two) times daily.     Sherwood Gambler, MD 11/22/16 [phone]

## 2016-11-21 NOTE — ED Notes (Signed)
Per [employer], patient had witnessed seizure while waiting in the lobby. Patient reports hx of seizures. Reports she has not been taking seizure medication as prescribed.

## 2016-11-21 NOTE — ED Notes (Signed)
ED Provider at bedside. 

## 2016-11-22 DIAGNOSIS — R1031 Right lower quadrant pain: Secondary | ICD-10-CM | POA: Diagnosis not present

## 2016-11-23 LAB — URINE CULTURE

## 2016-12-01 DIAGNOSIS — F319 Bipolar disorder, unspecified: Secondary | ICD-10-CM | POA: Diagnosis not present

## 2016-12-01 DIAGNOSIS — F250 Schizoaffective disorder, bipolar type: Secondary | ICD-10-CM | POA: Diagnosis not present

## 2016-12-01 DIAGNOSIS — I10 Essential (primary) hypertension: Secondary | ICD-10-CM | POA: Diagnosis not present

## 2016-12-01 DIAGNOSIS — E1165 Type 2 diabetes mellitus with hyperglycemia: Secondary | ICD-10-CM | POA: Diagnosis not present

## 2016-12-02 DIAGNOSIS — I10 Essential (primary) hypertension: Secondary | ICD-10-CM | POA: Diagnosis not present

## 2016-12-02 DIAGNOSIS — E1165 Type 2 diabetes mellitus with hyperglycemia: Secondary | ICD-10-CM | POA: Diagnosis not present

## 2016-12-02 DIAGNOSIS — E785 Hyperlipidemia, unspecified: Secondary | ICD-10-CM | POA: Diagnosis not present

## 2016-12-02 DIAGNOSIS — F259 Schizoaffective disorder, unspecified: Secondary | ICD-10-CM | POA: Diagnosis not present

## 2016-12-10 ENCOUNTER — Emergency Department (HOSPITAL_COMMUNITY): Payer: Medicare HMO

## 2016-12-10 ENCOUNTER — Observation Stay (HOSPITAL_COMMUNITY)
Admission: EM | Admit: 2016-12-10 | Discharge: 2016-12-12 | Disposition: A | Discharge status: Home / Self Care | Payer: Medicare HMO | Attending: Internal Medicine | Admitting: Internal Medicine

## 2016-12-10 ENCOUNTER — Other Ambulatory Visit: Payer: Self-pay

## 2016-12-10 ENCOUNTER — Encounter (HOSPITAL_COMMUNITY): Payer: Self-pay

## 2016-12-10 DIAGNOSIS — E119 Type 2 diabetes mellitus without complications: Secondary | ICD-10-CM | POA: Insufficient documentation

## 2016-12-10 DIAGNOSIS — Z794 Long term (current) use of insulin: Secondary | ICD-10-CM | POA: Diagnosis not present

## 2016-12-10 DIAGNOSIS — M797 Fibromyalgia: Secondary | ICD-10-CM | POA: Diagnosis not present

## 2016-12-10 DIAGNOSIS — Z91013 Allergy to seafood: Secondary | ICD-10-CM | POA: Diagnosis not present

## 2016-12-10 DIAGNOSIS — Z9221 Personal history of antineoplastic chemotherapy: Secondary | ICD-10-CM | POA: Insufficient documentation

## 2016-12-10 DIAGNOSIS — E1169 Type 2 diabetes mellitus with other specified complication: Secondary | ICD-10-CM

## 2016-12-10 DIAGNOSIS — R569 Unspecified convulsions: Secondary | ICD-10-CM | POA: Diagnosis not present

## 2016-12-10 DIAGNOSIS — Z808 Family history of malignant neoplasm of other organs or systems: Secondary | ICD-10-CM | POA: Diagnosis not present

## 2016-12-10 DIAGNOSIS — F6089 Other specific personality disorders: Secondary | ICD-10-CM | POA: Diagnosis not present

## 2016-12-10 DIAGNOSIS — G40909 Epilepsy, unspecified, not intractable, without status epilepticus: Secondary | ICD-10-CM | POA: Diagnosis not present

## 2016-12-10 DIAGNOSIS — R1032 Left lower quadrant pain: Secondary | ICD-10-CM | POA: Diagnosis not present

## 2016-12-10 DIAGNOSIS — M5489 Other dorsalgia: Secondary | ICD-10-CM | POA: Diagnosis not present

## 2016-12-10 DIAGNOSIS — E669 Obesity, unspecified: Secondary | ICD-10-CM

## 2016-12-10 DIAGNOSIS — Z885 Allergy status to narcotic agent status: Secondary | ICD-10-CM | POA: Diagnosis not present

## 2016-12-10 DIAGNOSIS — G4733 Obstructive sleep apnea (adult) (pediatric): Secondary | ICD-10-CM | POA: Insufficient documentation

## 2016-12-10 DIAGNOSIS — Z79899 Other long term (current) drug therapy: Secondary | ICD-10-CM | POA: Diagnosis not present

## 2016-12-10 DIAGNOSIS — R1012 Left upper quadrant pain: Secondary | ICD-10-CM | POA: Diagnosis not present

## 2016-12-10 DIAGNOSIS — Z888 Allergy status to other drugs, medicaments and biological substances status: Secondary | ICD-10-CM | POA: Insufficient documentation

## 2016-12-10 DIAGNOSIS — F250 Schizoaffective disorder, bipolar type: Secondary | ICD-10-CM | POA: Diagnosis not present

## 2016-12-10 DIAGNOSIS — N390 Urinary tract infection, site not specified: Secondary | ICD-10-CM | POA: Diagnosis present

## 2016-12-10 DIAGNOSIS — Z6841 Body Mass Index (BMI) 40.0 and over, adult: Secondary | ICD-10-CM | POA: Diagnosis not present

## 2016-12-10 DIAGNOSIS — F4310 Post-traumatic stress disorder, unspecified: Secondary | ICD-10-CM | POA: Diagnosis not present

## 2016-12-10 DIAGNOSIS — I10 Essential (primary) hypertension: Secondary | ICD-10-CM | POA: Insufficient documentation

## 2016-12-10 DIAGNOSIS — R52 Pain, unspecified: Secondary | ICD-10-CM | POA: Diagnosis not present

## 2016-12-10 LAB — CBC WITH DIFFERENTIAL/PLATELET
BASOS ABS: 0 10*3/uL (ref 0.0–0.1)
Basophils Relative: 0 %
Eosinophils Absolute: 0.1 10*3/uL (ref 0.0–0.7)
Eosinophils Relative: 2 %
HEMATOCRIT: 35 % — AB (ref 36.0–46.0)
HEMOGLOBIN: 11.3 g/dL — AB (ref 12.0–15.0)
LYMPHS PCT: 26 %
Lymphs Abs: 1.4 10*3/uL (ref 0.7–4.0)
MCH: 25.9 pg — ABNORMAL LOW (ref 26.0–34.0)
MCHC: 32.3 g/dL (ref 30.0–36.0)
MCV: 80.3 fL (ref 78.0–100.0)
MONO ABS: 0.3 10*3/uL (ref 0.1–1.0)
Monocytes Relative: 5 %
NEUTROS ABS: 3.7 10*3/uL (ref 1.7–7.7)
Neutrophils Relative %: 67 %
Platelets: 134 10*3/uL — ABNORMAL LOW (ref 150–400)
RBC: 4.36 MIL/uL (ref 3.87–5.11)
RDW: 15.2 % (ref 11.5–15.5)
WBC: 5.5 10*3/uL (ref 4.0–10.5)

## 2016-12-10 LAB — URINALYSIS, ROUTINE W REFLEX MICROSCOPIC
BILIRUBIN URINE: NEGATIVE
Glucose, UA: NEGATIVE mg/dL
HGB URINE DIPSTICK: NEGATIVE
KETONES UR: NEGATIVE mg/dL
Nitrite: NEGATIVE
Protein, ur: NEGATIVE mg/dL
Specific Gravity, Urine: 1.024 (ref 1.005–1.030)
pH: 5 (ref 5.0–8.0)

## 2016-12-10 LAB — COMPREHENSIVE METABOLIC PANEL
ALK PHOS: 64 U/L (ref 38–126)
ALT: 25 U/L (ref 14–54)
AST: 36 U/L (ref 15–41)
Albumin: 4.5 g/dL (ref 3.5–5.0)
Anion gap: 10 (ref 5–15)
BILIRUBIN TOTAL: 0.4 mg/dL (ref 0.3–1.2)
BUN: 15 mg/dL (ref 6–20)
CALCIUM: 9.3 mg/dL (ref 8.9–10.3)
CO2: 19 mmol/L — ABNORMAL LOW (ref 22–32)
CREATININE: 0.57 mg/dL (ref 0.44–1.00)
Chloride: 105 mmol/L (ref 101–111)
GFR calc Af Amer: >60 mL/min (ref >60)
Glucose, Bld: 226 mg/dL — ABNORMAL HIGH (ref 65–99)
Potassium: 3.9 mmol/L (ref 3.5–5.1)
Sodium: 134 mmol/L — ABNORMAL LOW (ref 135–145)
TOTAL PROTEIN: 8.1 g/dL (ref 6.5–8.1)

## 2016-12-10 LAB — PROTIME-INR
INR: 1.1
Prothrombin Time: 14.2 seconds (ref 11.4–15.2)

## 2016-12-10 LAB — LIPASE, BLOOD: LIPASE: 44 U/L (ref 11–51)

## 2016-12-10 LAB — POC URINE PREG, ED: Preg Test, Ur: NEGATIVE

## 2016-12-10 LAB — CBG MONITORING, ED: GLUCOSE-CAPILLARY: 264 mg/dL — AB (ref 65–99)

## 2016-12-10 MED ORDER — HYDROMORPHONE HCL 1 MG/ML IJ SOLN
1.0000 mg | Freq: Once | INTRAMUSCULAR | Status: AC
  Administered 2016-12-11: 1 mg via INTRAVENOUS
  Filled 2016-12-10: qty 1

## 2016-12-10 NOTE — ED Provider Notes (Signed)
[facility] DEPT Provider Note   CSN: [id number] Arrival date & time: 12/10/16  2012     History   Chief Complaint Chief Complaint  Patient presents with  . Back Pain    lower left  . Seizures    HPI Tina Saunders is a 31 y.o. female.  The history is provided by the patient and medical records. No language interpreter was used.  Seizures   This is a new problem. The current episode started more than 1 week ago. The problem has not changed since onset.There were more than 10 (areound 20 or so in the last month, 2 today) seizures. The most recent episode lasted 30 to 120 seconds. Associated symptoms include nausea. Pertinent negatives include no sleepiness, no confusion, no headaches, no visual disturbance, no neck stiffness, no sore throat, no cough, no vomiting and no diarrhea. Characteristics include rhythmic jerking and loss of consciousness. The episode was witnessed (by mother). The seizures did not continue in the ED. The seizure(s) had no focality. Possible causes do not include missed seizure meds or recent illness. There has been no fever. There were no medications administered prior to arrival.    Past Medical History:  Diagnosis Date  . Anxiety   . Bipolar 1 disorder ([place])   . Cancer of abdominal wall   . Depression   . Diabetes mellitus without complication ([place])   . Hypertension   . Obesity   . Obesity   . Polycystic ovarian syndrome 07/01/2011   Patient report  . 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 Schizophrenia [facility])     Patient Active Problem List   Diagnosis Date Noted  . Uncontrolled diabetes mellitus ([place]) 11/03/2016  . Schizoaffective disorder, bipolar type ([place]) 11/02/2016  . Cluster B personality disorder ([place]) 11/02/2016  . Schizoaffective disorder, mixed type ([place]) 05/30/2014  . Suicidal ideation   . 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 Headache 04/15/2013  . HTN (hypertension) 04/15/2013  . Post traumatic stress disorder  12/07/2011  . CAP (community acquired pneumonia) 08/27/2011  . Chest pain 08/26/2011  . SOB (shortness of breath) 08/26/2011  . Fever 08/26/2011  . Hypokalemia 08/26/2011  . PSVT (paroxysmal supraventricular tachycardia) ([place]) 08/26/2011  . ADHD 09/23/2007  . EPIGASTRIC PAIN 09/23/2007  . Obesity, unspecified 07/30/2007  . DEPRESSION 07/30/2007  . SLEEP DISORDER 07/30/2007  . IMPAIRED FASTING GLUCOSE 07/30/2007  . FATIGUE 11/21/2006  . ABNORMAL FINDINGS, ELEVATED BP W/O HTN 11/21/2006  . METRORRHAGIA 06/13/2006  . DISORDER, MENSTRUAL NEC 06/13/2006  . DIZZINESS 06/13/2006  . POLYCYSTIC OVARIAN DISEASE 04/25/2006  . AMENORRHEA, SECONDARY 04/20/2006  . ACNE, MILD 04/20/2006  . ABDOMINAL PAIN 04/20/2006    Past Surgical History:  Procedure Laterality Date  . CHOLECYSTECTOMY    . HERNIA REPAIR    . Ovarian Cyst Excision    . VARICOSE VEIN SURGERY      OB History    Gravida Para Term Preterm AB Living   0             SAB TAB Ectopic Multiple Live Births                   Home Medications    Prior to Admission medications   Medication Sig Start Date End Date Taking? Authorizing Provider  busPIRone (BUSPAR) 15 MG tablet Take 1 tablet (15 mg total) by mouth 3 (three) times daily. 11/04/16  Yes Derrill Center, NP  chlorproMAZINE (THORAZINE) 50 MG tablet Take 1 tablet (  50 mg total) by mouth 2 (two) times daily. Patient taking differently: Take 50 mg by mouth 3 (three) times daily.  11/04/16  Yes Derrill Center, NP  cloNIDine (CATAPRES) 0.1 MG tablet Take 1 tablet (0.1 mg total) by mouth 2 (two) times daily. 11/04/16  Yes Derrill Center, NP  cyclobenzaprine (FLEXERIL) 5 MG tablet Take 5 mg by mouth 2 (two) times daily as needed for muscle spasms.   Yes [provider]  dicyclomine (BENTYL) 20 MG tablet Take 20 mg by mouth every 8 (eight) hours as needed for spasms.   Yes [provider]  Dulaglutide (TRULICITY) 1.5 SN/0.5LZ SOPN Inject 1 pen once a week  into the skin.   Yes [provider]  fenofibrate (TRICOR) 145 MG tablet Take 145 mg daily by mouth.   Yes [provider]  gabapentin (NEURONTIN) 400 MG capsule Take 1 capsule (400 mg total) by mouth 3 (three) times daily. 11/04/16  Yes Derrill Center, NP  insulin aspart (NOVOLOG) 100 UNIT/ML injection Inject 22 Units 3 (three) times daily with meals as needed into the skin.   Yes [provider]  insulin detemir (LEVEMIR) 100 UNIT/ML injection Inject 0.7 mLs (70 Units total) into the skin 2 (two) times daily. Patient taking differently: Inject 68 Units into the skin 2 (two) times daily.  11/04/16  Yes Derrill Center, NP  levETIRAcetam (KEPPRA) 500 MG tablet Take 1 tablet (500 mg total) by mouth 2 (two) times daily. 11/21/16  Yes Sherwood Gambler, MD  lisinopril (PRINIVIL,ZESTRIL) 10 MG tablet Take 10 mg by mouth daily.   Yes [provider]  pantoprazole (PROTONIX) 40 MG tablet Take 40 mg by mouth daily.   Yes [provider]  QUEtiapine (SEROQUEL) 100 MG tablet Take 100 mg by mouth daily.   Yes [provider]  QUEtiapine (SEROQUEL) 400 MG tablet Take 2 tablets (800 mg total) by mouth at bedtime. 11/04/16  Yes Derrill Center, NP  sertraline (ZOLOFT) 100 MG tablet Take 2 tablets (200 mg total) by mouth every morning. 11/05/16  Yes Derrill Center, NP  simvastatin (ZOCOR) 10 MG tablet Take 1 tablet (10 mg total) by mouth at bedtime. 11/04/16  Yes Derrill Center, NP  topiramate (TOPAMAX) 100 MG tablet Take 1 tablet (100 mg total) by mouth 2 (two) times daily. 11/04/16  Yes Derrill Center, NP  traZODone (DESYREL) 50 MG tablet Take 1 tablet (50 mg total) by mouth at bedtime as needed for sleep. Patient taking differently: Take 25 mg by mouth 2 (two) times daily.  11/04/16  Yes Derrill Center, NP    Family History Family History  Problem Relation Age of Onset  . Coronary artery disease Maternal Grandmother   . Diabetes type II Maternal  Grandmother   . Cancer Maternal Grandmother   . Hypertension Mother   . Hypertension Father     Social History Social History   Tobacco Use  . Smoking status: Never Smoker  . Smokeless tobacco: Never Used  Substance Use Topics  . Alcohol use: No  . Drug use: No     Allergies   Fish-derived products; Geodon [ziprasidone hcl]; Haldol [haloperidol lactate]; Buprenorphine hcl; Compazine [prochlorperazine]; Morphine and related; and Toradol [ketorolac tromethamine]   Review of Systems Review of Systems  Constitutional: Negative for chills, fatigue and fever.  HENT: Negative for congestion and sore throat.   Eyes: Negative for photophobia and visual disturbance.  Respiratory: Negative for cough, chest tightness, shortness  of breath, wheezing and stridor.   Gastrointestinal: Positive for abdominal pain and nausea. Negative for diarrhea and vomiting.  Genitourinary: Negative for dysuria and flank pain.  Musculoskeletal: Negative for back pain, neck pain and neck stiffness.  Skin: Negative for rash and wound.  Neurological: Positive for seizures and loss of consciousness. Negative for light-headedness and headaches.  Psychiatric/Behavioral: Negative for agitation and confusion.     Physical Exam Updated Vital Signs BP 121/77 (BP Location: Left Arm)   Resp 18   Ht 5\' 2"  (1.575 m)   Wt 108.4 kg (239 lb)   SpO2 97%   BMI 43.71 kg/m   Physical Exam  Constitutional: She is oriented to person, place, and time. She appears well-developed and well-nourished. No distress.  HENT:  Head: Normocephalic and atraumatic.  Nose: Nose normal.  Mouth/Throat: Oropharynx is clear and moist. No oropharyngeal exudate.  Eyes: Conjunctivae and EOM are normal. Pupils are equal, round, and reactive to light.  Neck: Normal range of motion. Neck supple. No JVD present.  Cardiovascular: Normal rate and regular rhythm. Exam reveals no gallop.  No murmur heard. Pulmonary/Chest: Effort normal and  breath sounds normal. No respiratory distress. She has no wheezes. She exhibits no tenderness.  Abdominal: Soft. There is tenderness.  Musculoskeletal: She exhibits no edema or tenderness.  Neurological: She is alert and oriented to person, place, and time. No cranial nerve deficit or sensory deficit. She exhibits normal muscle tone.  Skin: Skin is warm and dry. Capillary refill takes less than 2 seconds. She is not diaphoretic. No erythema.  Psychiatric: She has a normal mood and affect.  Nursing note and vitals reviewed.    ED Treatments / Results  Labs (all labs ordered are listed, but only abnormal results are displayed) Labs Reviewed  CBC WITH DIFFERENTIAL/PLATELET - Abnormal; Notable for the following components:      Result Value   Hemoglobin 11.3 (*)    HCT 35.0 (*)    MCH 25.9 (*)    Platelets 134 (*)    All other components within normal limits  COMPREHENSIVE METABOLIC PANEL - Abnormal; Notable for the following components:   Sodium 134 (*)    CO2 19 (*)    Glucose, Bld 226 (*)    All other components within normal limits  URINALYSIS, ROUTINE W REFLEX MICROSCOPIC - Abnormal; Notable for the following components:   APPearance CLOUDY (*)    Leukocytes, UA LARGE (*)    Bacteria, UA RARE (*)    Squamous Epithelial / LPF 6-30 (*)    All other components within normal limits  GLUCOSE, CAPILLARY - Abnormal; Notable for the following components:   Glucose-Capillary 216 (*)    All other components within normal limits  GLUCOSE, CAPILLARY - Abnormal; Notable for the following components:   Glucose-Capillary 196 (*)    All other components within normal limits  GLUCOSE, CAPILLARY - Abnormal; Notable for the following components:   Glucose-Capillary 282 (*)    All other components within normal limits  CBG MONITORING, ED - Abnormal; Notable for the following components:   Glucose-Capillary 264 (*)    All other components within normal limits  URINE CULTURE  LIPASE, BLOOD    PROTIME-INR  MAGNESIUM  HIV ANTIBODY (ROUTINE TESTING)  POC URINE PREG, ED    EKG  EKG Interpretation None       Radiology Ct Head Wo Contrast  Result Date: 12/10/2016 CLINICAL DATA:  Left lower abdominal pain radiating to the low back for 3 days.  Seizures today. History of diabetes. EXAM: CT HEAD WITHOUT CONTRAST TECHNIQUE: Contiguous axial images were obtained from the base of the skull through the vertex without intravenous contrast. COMPARISON:  11/08/2016 FINDINGS: Brain: No evidence of acute infarction, hemorrhage, hydrocephalus, extra-axial collection or mass lesion/mass effect. Vascular: No hyperdense vessel or unexpected calcification. Skull: Normal. Negative for fracture or focal lesion. 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 Paranasal sinuses are otherwise clear. No acute air-fluid levels. Mastoid air cells are clear. Other: None. IMPRESSION: No acute intracranial abnormalities. Electronically Signed   By: Lucienne Capers M.D.   On: 12/10/2016 23:28   Mr Jeri Cos GL Contrast  Result Date: 12/11/2016 CLINICAL DATA:  New onset seizure.  History rhabdomyosarcoma EXAM: MRI HEAD WITHOUT AND WITH CONTRAST TECHNIQUE: Multiplanar, multiecho pulse sequences of the brain and surrounding structures were obtained without and with intravenous contrast. CONTRAST:  20 mL MultiHance IV COMPARISON:  CT head 12/10/2016 FINDINGS: Brain: No acute infarction, hemorrhage, hydrocephalus, extra-axial collection or mass lesion. 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 This is stable from prior studies. Normal orbit. Other: Non IMPRESSION: Negative MRI of the brain with contrast. Electronically Signed   By: Franchot Gallo M.D.   On: 12/11/2016 07:10     Procedures Procedures (including critical care time)  Medications Ordered in ED Medications  busPIRone (BUSPAR) tablet 15 mg (15 mg Oral Given 12/11/16 1014)  chlorproMAZINE (THORAZINE) tablet 50 mg (50 mg Oral Given 12/11/16 1012)  cloNIDine (CATAPRES) tablet 0.1 mg (0.1 mg Oral Given 12/11/16 1012)  cyclobenzaprine (FLEXERIL) tablet 5 mg (5 mg Oral Given 12/11/16 0819)  dicyclomine (BENTYL) tablet 20 mg (not administered)  fenofibrate tablet 160 mg (160 mg Oral Given 12/11/16 1012)  gabapentin (NEURONTIN) capsule 400 mg (400 mg Oral Given 12/11/16 1014)  lisinopril (PRINIVIL,ZESTRIL) tablet 10 mg (10 mg Oral Given 12/11/16 1015)  pantoprazole (PROTONIX) EC tablet 40 mg (40 mg Oral Given 12/11/16 1012)  QUEtiapine (SEROQUEL) tablet 100 mg (100 mg Oral Given 12/11/16 1015)  QUEtiapine (SEROQUEL) tablet 800 mg (800 mg Oral Given 12/11/16 0227)  sertraline (ZOLOFT) tablet 200 mg (200 mg Oral Given 12/11/16 1014)  simvastatin (ZOCOR) tablet 10 mg (10 mg Oral Given 12/11/16 0227)  traZODone (DESYREL) tablet 25 mg (25 mg Oral Given 12/11/16 1013)  topiramate (TOPAMAX) tablet 100 mg (100 mg Oral Given 12/11/16 1015)  insulin aspart (novoLOG) injection 0-20 Units (11 Units Subcutaneous Given 12/11/16 1231)  insulin detemir (LEVEMIR) injection 50 Units (50 Units Subcutaneous Given 12/11/16 1011)  acetaminophen (TYLENOL) tablet 650 mg (not administered)    Or  acetaminophen (TYLENOL) suppository 650 mg (not administered)  ondansetron (ZOFRAN) tablet 4 mg (not administered)    Or  ondansetron (ZOFRAN) injection 4 mg (not administered)  enoxaparin (LOVENOX) injection 40 mg (40 mg Subcutaneous Given 12/11/16 1011)  levETIRAcetam (KEPPRA) tablet 500 mg (500 mg Oral Given 12/11/16 1012)  HYDROmorphone (DILAUDID) injection 1 mg (1 mg Intravenous Given 12/11/16 0002)  gadobenate dimeglumine (MULTIHANCE) injection 20 mL (20 mLs Intravenous Contrast Given 12/11/16 0747)     Initial Impression  / Assessment and Plan / ED Course  I have reviewed the triage vital signs and the nursing notes.  Pertinent labs & imaging results that were available during my care of the patient were reviewed by me and considered in my medical decision making (see chart for details).  Tina Saunders is a 31 y.o. 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  Patient says that she was admitted at [facility] last month for glucose abnormalities.  She reports that since her discharge, she has been having numerous seizures.  She says that she has never had seizures before.  She says that over the last few weeks she has had around 20 seizures.  She reports having a seizure almost every other day and had 2 seizures tonight.  She reports that they are witnessed by her mother who said that she was unresponsive and shaking all over.  Patient reports that they lasted several minutes.  She says that when she was last seen, she was started on Keppra which she has been taking 500 mg twice a day.  She says that despite her medications, she is continued to have the seizures.  She denies headaches, vision changes.  She reports some nausea but no vomiting.  She reports no fevers or chills.  She denies urinary symptoms.  She denies constipation or diarrhea.  She denies any numbness, tingling, or weakness of extremities.  She reports her left-sided abdominal pain is an 8 out of 10 in severity and located in the left upper quadrant.  She says she has had this pain before.  She denies any history of pancreatitis.  She says that she has had numerous CT scans and ultrasounds of her abdomen.  She describes her pain has been constant for the last 3 days in the abdomen.  On exam, patient has some tenderness in the left upper quadrant.  No tenderness in  the rest of the abdomen.  Lungs are clear.  Chest is nontender.  No neck stiffness or nuchal rigidity.  No neck pain.  Patient had no focal neurologic deficits on my exam.  Normal extraocular movements.  Pupils are symmetric and reactive bilaterally.  Normal coordination sensation and strength in extremities.  Based on patient's history of metastasis from rhabdo sarcoma, and her new diagnosis of seizures, there is concern for possible metastasis to the brain.  Patient will have a head CT initially to evaluate.  Patient says that she was discharged and has yet to follow-up with a neurologist and she has not seen a neurologist for the new seizures.  Patient will have screening laboratory testing for the abdominal pain.  With her numerous CT scans most recently showing no significant abnormalities last month, will hold on CT imaging of the abdomen and pelvis.  Will have labs to look for pancreatitis or other abnormalities.  Patient will be given pain medications during initial workup.  Patient will likely need neurology consultation and admission for further workup of her numerous new seizures despite taking Keppra.  Patient denies caffeine use.  She denies any drug use or alcohol use.  She denies any head trauma.  She denies other complaints on arrival.  Lipase not elevated.  Laboratory testing otherwise reassuring.  I suspect her abdominal pain is more chronic in nature as reviewing the chart shows frequent visits for abdominal pain.  11:58 PM CT head showed no significant abnormalities.  Neurology was called due to the new seizures.  They recommended patient be admitted to hospitalist service with spot EEG performed as well as MRI with and without contrast.  They will come see the patient for further management.  Hospitalist  team will be called for admission.      Final Clinical Impressions(s) / ED Diagnoses   Final diagnoses:  Seizures ([place])  Left upper quadrant pain     Clinical  Impression: 1. Seizures ([place])   2. Left upper quadrant pain     Disposition: Admit to hospitalist service    Tegeler, Gwenyth Allegra, MD 12/11/16 [phone]

## 2016-12-10 NOTE — ED Triage Notes (Signed)
Pt arrived via [facility] presenting with lower left abdominal pain radiating to her lower back for 3 days. Pt states that she also had 2 grand mal seizures today at 1700. Pt states Keppra was taken today. Pt has hx of Diabetes. Last VS bp 131/89, pulse 104, RR 18 96% on RA, CBG 250

## 2016-12-10 NOTE — ED Notes (Signed)
Bed: WA07 Expected date:  Expected time:  Means of arrival:  Comments: 59m seizure

## 2016-12-10 NOTE — ED Notes (Signed)
Bed: WLPT1 Expected date:  Expected time:  Means of arrival:  Comments: 

## 2016-12-11 ENCOUNTER — Observation Stay (HOSPITAL_COMMUNITY): Payer: Medicare HMO

## 2016-12-11 ENCOUNTER — Observation Stay (HOSPITAL_BASED_OUTPATIENT_CLINIC_OR_DEPARTMENT_OTHER)
Admit: 2016-12-11 | Discharge: 2016-12-11 | Disposition: A | Discharge status: Home / Self Care | Payer: Medicare HMO | Attending: Internal Medicine | Admitting: Internal Medicine

## 2016-12-11 ENCOUNTER — Other Ambulatory Visit: Payer: Self-pay

## 2016-12-11 DIAGNOSIS — R739 Hyperglycemia, unspecified: Secondary | ICD-10-CM | POA: Diagnosis not present

## 2016-12-11 DIAGNOSIS — R569 Unspecified convulsions: Secondary | ICD-10-CM | POA: Diagnosis not present

## 2016-12-11 DIAGNOSIS — E1169 Type 2 diabetes mellitus with other specified complication: Secondary | ICD-10-CM

## 2016-12-11 DIAGNOSIS — Z794 Long term (current) use of insulin: Secondary | ICD-10-CM | POA: Diagnosis not present

## 2016-12-11 DIAGNOSIS — E669 Obesity, unspecified: Secondary | ICD-10-CM

## 2016-12-11 DIAGNOSIS — N390 Urinary tract infection, site not specified: Secondary | ICD-10-CM

## 2016-12-11 DIAGNOSIS — E119 Type 2 diabetes mellitus without complications: Secondary | ICD-10-CM | POA: Diagnosis not present

## 2016-12-11 DIAGNOSIS — F6089 Other specific personality disorders: Secondary | ICD-10-CM | POA: Diagnosis not present

## 2016-12-11 LAB — GLUCOSE, CAPILLARY
Glucose-Capillary: 216 mg/dL — ABNORMAL HIGH (ref 65–99)
Glucose-Capillary: 196 mg/dL — ABNORMAL HIGH (ref 65–99)
Glucose-Capillary: 282 mg/dL — ABNORMAL HIGH (ref 65–99)
Glucose-Capillary: 218 mg/dL — ABNORMAL HIGH (ref 65–99)

## 2016-12-11 LAB — MAGNESIUM: MAGNESIUM: 1.7 mg/dL (ref 1.7–2.4)

## 2016-12-11 LAB — HIV ANTIBODY (ROUTINE TESTING W REFLEX): HIV SCREEN 4TH GENERATION: NONREACTIVE

## 2016-12-11 MED ORDER — QUETIAPINE FUMARATE 300 MG PO TABS
800.0000 mg | ORAL_TABLET | Freq: Every day | ORAL | Status: DC
Start: 2016-12-11 — End: 2016-12-12
  Administered 2016-12-11 (×2): 800 mg via ORAL
  Filled 2016-12-11 (×2): qty 2

## 2016-12-11 MED ORDER — SIMVASTATIN 10 MG PO TABS
10.0000 mg | ORAL_TABLET | Freq: Every day | ORAL | Status: DC
  Administered 2016-12-11 (×2): 10 mg via ORAL
  Filled 2016-12-11 (×2): qty 1

## 2016-12-11 MED ORDER — BUSPIRONE HCL 10 MG PO TABS
15.0000 mg | ORAL_TABLET | Freq: Three times a day (TID) | ORAL | Status: DC
  Administered 2016-12-11 – 2016-12-12 (×4): 15 mg via ORAL
  Filled 2016-12-11 (×4): qty 2

## 2016-12-11 MED ORDER — ONDANSETRON HCL 4 MG PO TABS: 4.0000 mg | ORAL_TABLET | Freq: Four times a day (QID) | ORAL | Status: DC | PRN

## 2016-12-11 MED ORDER — ACETAMINOPHEN 325 MG PO TABS: 650.0000 mg | ORAL_TABLET | Freq: Four times a day (QID) | ORAL | Status: DC | PRN

## 2016-12-11 MED ORDER — DICYCLOMINE HCL 20 MG PO TABS
20.0000 mg | ORAL_TABLET | Freq: Three times a day (TID) | ORAL | Status: DC | PRN
  Filled 2016-12-11: qty 1

## 2016-12-11 MED ORDER — ENOXAPARIN SODIUM 40 MG/0.4ML ~~LOC~~ SOLN
40.0000 mg | SUBCUTANEOUS | Status: DC
  Administered 2016-12-11 – 2016-12-12 (×2): 40 mg via SUBCUTANEOUS
  Filled 2016-12-11 (×2): qty 0.4

## 2016-12-11 MED ORDER — ONDANSETRON HCL 4 MG/2ML IJ SOLN: 4.0000 mg | Freq: Four times a day (QID) | INTRAMUSCULAR | Status: DC | PRN

## 2016-12-11 MED ORDER — TRAZODONE HCL 50 MG PO TABS
25.0000 mg | ORAL_TABLET | Freq: Two times a day (BID) | ORAL | Status: DC
  Administered 2016-12-11 – 2016-12-12 (×4): 25 mg via ORAL
  Filled 2016-12-11 (×4): qty 1

## 2016-12-11 MED ORDER — LISINOPRIL 10 MG PO TABS
10.0000 mg | ORAL_TABLET | Freq: Every day | ORAL | Status: DC
  Administered 2016-12-11 – 2016-12-12 (×2): 10 mg via ORAL
  Filled 2016-12-11 (×2): qty 1

## 2016-12-11 MED ORDER — ACETAMINOPHEN 650 MG RE SUPP: 650.0000 mg | Freq: Four times a day (QID) | RECTAL | Status: DC | PRN

## 2016-12-11 MED ORDER — GABAPENTIN 400 MG PO CAPS
400.0000 mg | ORAL_CAPSULE | Freq: Three times a day (TID) | ORAL | Status: DC
Start: 2016-12-11 — End: 2016-12-12
  Administered 2016-12-11 – 2016-12-12 (×4): 400 mg via ORAL
  Filled 2016-12-11 (×4): qty 1

## 2016-12-11 MED ORDER — PANTOPRAZOLE SODIUM 40 MG PO TBEC
40.0000 mg | DELAYED_RELEASE_TABLET | Freq: Every day | ORAL | Status: DC
  Administered 2016-12-11 – 2016-12-12 (×2): 40 mg via ORAL
  Filled 2016-12-11 (×2): qty 1

## 2016-12-11 MED ORDER — CHLORPROMAZINE HCL 25 MG PO TABS
50.0000 mg | ORAL_TABLET | Freq: Three times a day (TID) | ORAL | Status: DC
  Administered 2016-12-11 – 2016-12-12 (×4): 50 mg via ORAL
  Filled 2016-12-11 (×5): qty 2

## 2016-12-11 MED ORDER — LEVETIRACETAM 500 MG PO TABS
500.0000 mg | ORAL_TABLET | Freq: Two times a day (BID) | ORAL | Status: DC
  Administered 2016-12-11 – 2016-12-12 (×4): 500 mg via ORAL
  Filled 2016-12-11 (×4): qty 1

## 2016-12-11 MED ORDER — GADOBENATE DIMEGLUMINE 529 MG/ML IV SOLN
20.0000 mL | Freq: Once | INTRAVENOUS | Status: AC | PRN
  Administered 2016-12-11: 20 mL via INTRAVENOUS

## 2016-12-11 MED ORDER — TOPIRAMATE 100 MG PO TABS
100.0000 mg | ORAL_TABLET | Freq: Two times a day (BID) | ORAL | Status: DC
Start: 2016-12-11 — End: 2016-12-12
  Administered 2016-12-11 – 2016-12-12 (×4): 100 mg via ORAL
  Filled 2016-12-11 (×4): qty 1

## 2016-12-11 MED ORDER — BISACODYL 5 MG PO TBEC: 10.0000 mg | DELAYED_RELEASE_TABLET | Freq: Once | ORAL | Status: DC

## 2016-12-11 MED ORDER — SERTRALINE HCL 50 MG PO TABS
200.0000 mg | ORAL_TABLET | Freq: Every day | ORAL | Status: DC
Start: 2016-12-11 — End: 2016-12-12
  Administered 2016-12-11 – 2016-12-12 (×2): 200 mg via ORAL
  Filled 2016-12-11 (×2): qty 4

## 2016-12-11 MED ORDER — CLONIDINE HCL 0.1 MG PO TABS
0.1000 mg | ORAL_TABLET | Freq: Two times a day (BID) | ORAL | Status: DC
  Administered 2016-12-11 – 2016-12-12 (×4): 0.1 mg via ORAL
  Filled 2016-12-11 (×4): qty 1

## 2016-12-11 MED ORDER — QUETIAPINE FUMARATE 100 MG PO TABS
100.0000 mg | ORAL_TABLET | Freq: Every day | ORAL | Status: DC
  Administered 2016-12-11 – 2016-12-12 (×2): 100 mg via ORAL
  Filled 2016-12-11 (×2): qty 1

## 2016-12-11 MED ORDER — INSULIN ASPART 100 UNIT/ML ~~LOC~~ SOLN
0.0000 [IU] | Freq: Three times a day (TID) | SUBCUTANEOUS | Status: DC
  Administered 2016-12-11: 11 [IU] via SUBCUTANEOUS
  Administered 2016-12-11: 7 [IU] via SUBCUTANEOUS
  Administered 2016-12-11 – 2016-12-12 (×2): 4 [IU] via SUBCUTANEOUS

## 2016-12-11 MED ORDER — DEXTROSE 5 % IV SOLN
1.0000 g | Freq: Every day | INTRAVENOUS | Status: DC
  Administered 2016-12-11: 1 g via INTRAVENOUS
  Filled 2016-12-11: qty 10

## 2016-12-11 MED ORDER — FENOFIBRATE 160 MG PO TABS
160.0000 mg | ORAL_TABLET | Freq: Every day | ORAL | Status: DC
  Administered 2016-12-11 – 2016-12-12 (×2): 160 mg via ORAL
  Filled 2016-12-11 (×2): qty 1

## 2016-12-11 MED ORDER — INSULIN DETEMIR 100 UNIT/ML ~~LOC~~ SOLN
50.0000 [IU] | Freq: Two times a day (BID) | SUBCUTANEOUS | Status: DC
  Administered 2016-12-11 – 2016-12-12 (×4): 50 [IU] via SUBCUTANEOUS
  Filled 2016-12-11 (×5): qty 0.5

## 2016-12-11 MED ORDER — CYCLOBENZAPRINE HCL 10 MG PO TABS
5.0000 mg | ORAL_TABLET | Freq: Two times a day (BID) | ORAL | Status: DC | PRN
  Administered 2016-12-11: 5 mg via ORAL
  Filled 2016-12-11: qty 1

## 2016-12-11 NOTE — Consult Note (Addendum)
[place] Consult   Reason for Consult:  Medication management  Referring Physician:  Dr. Starla Link Patient Identification: Tina Saunders MRN:  [id number] Principal Diagnosis: Cluster B personality disorder [facility]) Diagnosis:   Patient Active Problem List   Diagnosis Date Noted  . Seizures ([place]) [R56.9] 12/11/2016  . Acute lower UTI [N39.0] 12/11/2016  . DM2 (diabetes mellitus, type 2) ([place]) [E11.9] 12/11/2016  . Uncontrolled diabetes mellitus ([place]) [E11.65] 11/03/2016  . Schizoaffective disorder, bipolar type ([place]) [F25.0] 11/02/2016  . Cluster B personality disorder ([place]) [F60.9] 11/02/2016  . Schizoaffective disorder, mixed type ([place]) [F25.0] 05/30/2014  . Suicidal ideation [R45.851]   . 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 Headache [R51] 04/15/2013  . HTN (hypertension) [I10] 04/15/2013  . Post traumatic stress disorder [F43.10] 12/07/2011  . CAP (community acquired pneumonia) [J18.9] 08/27/2011  . Chest pain [R07.9] 08/26/2011  . SOB (shortness of breath) [R06.02] 08/26/2011  . Fever [R50.9] 08/26/2011  . Hypokalemia [E87.6] 08/26/2011  . PSVT (paroxysmal supraventricular tachycardia) ([place]) [I47.1] 08/26/2011  . ADHD [F90.9] 09/23/2007  . EPIGASTRIC PAIN [R10.13] 09/23/2007  . Obesity, unspecified [E66.9] 07/30/2007  . DEPRESSION [F32.9] 07/30/2007  . SLEEP DISORDER [G47.9] 07/30/2007  . IMPAIRED FASTING GLUCOSE [R73.01] 07/30/2007  . FATIGUE [R53.81, R53.83] 11/21/2006  . ABNORMAL FINDINGS, ELEVATED BP W/O HTN [R03.0] 11/21/2006  . METRORRHAGIA [N92.1] 06/13/2006  . DISORDER, MENSTRUAL NEC [N94.9] 06/13/2006  . DIZZINESS [R42] 06/13/2006  . POLYCYSTIC OVARIAN DISEASE [E28.2] 04/25/2006  . AMENORRHEA, SECONDARY [N91.2] 04/20/2006  . ACNE, MILD [L70.8] 04/20/2006  . ABDOMINAL PAIN [R10.9] 04/20/2006    Total Time spent with patient: 1 hour  Subjective:   Tina Saunders is a 31 y.o. female patient admitted with seizures.  HPI:   Per chart  review, patient had onset of seizures in September in the setting of hyperglycemia. She has had about 20 episodes in the past month. She was started on Keppra 500 mg BID on 10/30 after presenting to the ED. She is also currently prescribed Gabapentin 400 mg TID and Topamax 100 mg BID for fibromyalgia.  Psychiatry was consulted to simplify her medication regimen in the setting of having multiple medications prescribed that can lower her seizure threshold. Home medications include Zoloft 200 mg daily, Buspar 15 mg TID, Clonidine 0.1 mg BID, Thorazine 50 mg  TID and Seroquel 100 mg daily and 800 mg qhs.   Of note, she was admitted to [facility] on 10/12 for SI with plan to overdose on medications and AVH. She was diagnosed with cluster B personality disorder. The patient's medication regimen was discussed with her due to chronic use of high dose antipsychotics. She was reluctant to reduce her medications since she had been taking them as prescribed for several years and felt that they were beneficial for her mood. The morning dose of Seroquel was discontinued (100 mg). HA1c was 10.9 and triglycerides were 366 at this admission.  On interview, [name] denies seizure activity since yesterday and reports feeling better today. She reports that her mood is good. She denies recent problems with depression or anxiety. She denies SI, HI or AVH. She denies problems with sleep or appetite. She reports compliance with her medications. She reports restarting the morning dose of Seroquel after discharge from [facility] in October. The risk of lowering her seizure threshold and metabolic effects of antipsychotic medications were discussed with her today. She declines making adjustments to her regimen but agrees to discuss this with the ACT team. She sees  Dr. Ronelle Nigh through [facility]. The ACT team visits her twice a month.    Past Psychiatric History: Depression, anxiety, borderline personality disorder and history of suicide attempt by  cutting wrist 6 years ago.    Risk to Self: Is patient at risk for suicide?: No Risk to Others:  None. Denies HI.  Prior Inpatient Therapy:  She was admitted to [facility] twice (11/2011 and 10/2016). Prior Outpatient Therapy:  She is followed by the ACT team.   Past Medical History:  Past Medical History:  Diagnosis Date  . Anxiety   . Bipolar 1 disorder ([place])   . Cancer of abdominal wall   . Depression   . Diabetes mellitus without complication ([place])   . Hypertension   . Obesity   . Obesity   . Polycystic ovarian syndrome 07/01/2011   Patient report  . 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 Schizophrenia [facility])     Past Surgical History:  Procedure Laterality Date  . CHOLECYSTECTOMY    . HERNIA REPAIR    . Ovarian Cyst Excision    . VARICOSE VEIN SURGERY     Family History:  Family History  Problem Relation Age of Onset  . Coronary artery disease Maternal Grandmother   . Diabetes type II Maternal Grandmother   . Cancer Maternal Grandmother   . Hypertension Mother   . Hypertension Father    Family Psychiatric  History: Denies  Social History:  Social History   Substance and Sexual Activity  Alcohol Use No     Social History   Substance and Sexual Activity  Drug Use No    Social History   Socioeconomic History  . Marital status: Single    Spouse name: None  . Number of children: None  . Years of education: None  . Highest education level: None  Social Needs  . Financial resource strain: None  . Food insecurity - worry: None  . Food insecurity - inability: None  . Transportation needs - medical: None  . Transportation needs - non-medical: None  Occupational History  . None  Tobacco Use  . Smoking status: Never Smoker  . Smokeless tobacco: Never Used  Substance and Sexual Activity  . Alcohol use: No  . Drug use: No  . Sexual activity: No    Birth control/protection: None  Other Topics Concern  . None  Social History Narrative  . None   Additional Social History:  She lives with her mother in [place].    Allergies:   Allergies  Allergen Reactions  . Fish-Derived Products Anaphylaxis    Can only eat FLounder  . Geodon [Ziprasidone Hcl] Other (See Comments)    Face pulls, cant swallow - Locked Jaw  . Haldol [Haloperidol Lactate] Other (See Comments)    Face pulls, can't swallow - Locked Jaw  . Buprenorphine Hcl Hives, Itching, Rash and Other (See Comments)    GI upset  . Compazine [Prochlorperazine] Other (See Comments)    anxiety and hyperactivity  . Morphine And Related Hives, Itching, Rash and Other (See Comments)    GI upset  . Toradol [Ketorolac Tromethamine] Other (See Comments)    Anxiety and hyperactivity    Labs:  Results for orders placed or performed during the hospital encounter of 12/10/16 (from the past 48 hour(s))  CBG monitoring, ED     Status: Abnormal   Collection Time: 12/10/16  9:01 PM  Result Value Ref Range   Glucose-Capillary 264 (H) 65 - 99 mg/dL  Urinalysis, Routine w reflex microscopic  Status: Abnormal   Collection Time: 12/10/16 10:14 PM  Result Value Ref Range   Color, Urine YELLOW YELLOW   APPearance CLOUDY (A) CLEAR   Specific Gravity, Urine 1.024 1.005 - 1.030   pH 5.0 5.0 - 8.0   Glucose, UA NEGATIVE NEGATIVE mg/dL   Hgb urine dipstick NEGATIVE NEGATIVE   Bilirubin Urine NEGATIVE NEGATIVE   Ketones, ur NEGATIVE NEGATIVE mg/dL   Protein, ur NEGATIVE NEGATIVE mg/dL   Nitrite NEGATIVE NEGATIVE   Leukocytes, UA LARGE (A) NEGATIVE   RBC / HPF 6-30 0 - 5 RBC/hpf   WBC, UA 0-5 0 - 5 WBC/hpf   Bacteria, UA RARE (A) NONE SEEN   Squamous Epithelial / LPF 6-30 (A) NONE SEEN   Mucus PRESENT    Hyaline Casts, UA PRESENT   POC Urine Pregnancy, ED (do NOT order at [facility])     Status: None   Collection Time: 12/10/16 10:30 PM  Result Value Ref Range   Preg Test, Ur NEGATIVE NEGATIVE    Comment:        THE SENSITIVITY OF THIS METHODOLOGY IS >24 mIU/mL   CBC with Differential     Status: Abnormal    Collection Time: 12/10/16 10:39 PM  Result Value Ref Range   WBC 5.5 4.0 - 10.5 K/uL   RBC 4.36 3.87 - 5.11 MIL/uL   Hemoglobin 11.3 (L) 12.0 - 15.0 g/dL   HCT 35.0 (L) 36.0 - 46.0 %   MCV 80.3 78.0 - 100.0 fL   MCH 25.9 (L) 26.0 - 34.0 pg   MCHC 32.3 30.0 - 36.0 g/dL   RDW 15.2 11.5 - 15.5 %   Platelets 134 (L) 150 - 400 K/uL    Comment: REPEATED TO VERIFY   Neutrophils Relative % 67 %   Neutro Abs 3.7 1.7 - 7.7 K/uL   Lymphocytes Relative 26 %   Lymphs Abs 1.4 0.7 - 4.0 K/uL   Monocytes Relative 5 %   Monocytes Absolute 0.3 0.1 - 1.0 K/uL   Eosinophils Relative 2 %   Eosinophils Absolute 0.1 0.0 - 0.7 K/uL   Basophils Relative 0 %   Basophils Absolute 0.0 0.0 - 0.1 K/uL  Comprehensive metabolic panel     Status: Abnormal   Collection Time: 12/10/16 10:39 PM  Result Value Ref Range   Sodium 134 (L) 135 - 145 mmol/L   Potassium 3.9 3.5 - 5.1 mmol/L   Chloride 105 101 - 111 mmol/L   CO2 19 (L) 22 - 32 mmol/L   Glucose, Bld 226 (H) 65 - 99 mg/dL   BUN 15 6 - 20 mg/dL   Creatinine, Ser 0.57 0.44 - 1.00 mg/dL   Calcium 9.3 8.9 - 10.3 mg/dL   Total Protein 8.1 6.5 - 8.1 g/dL   Albumin 4.5 3.5 - 5.0 g/dL   AST 36 15 - 41 U/L   ALT 25 14 - 54 U/L   Alkaline Phosphatase 64 38 - 126 U/L   Total Bilirubin 0.4 0.3 - 1.2 mg/dL   GFR calc non Af Amer >60 >60 mL/min   GFR calc Af Amer >60 >60 mL/min    Comment: (NOTE) The eGFR has been calculated using the CKD EPI equation. This calculation has not been validated in all clinical situations. eGFR's persistently <60 mL/min signify possible Chronic Kidney Disease.    Anion gap 10 5 - 15  Lipase, blood     Status: None   Collection Time: 12/10/16 10:39 PM  Result Value Ref Range  Lipase 44 11 - 51 U/L  Protime-INR     Status: None   Collection Time: 12/10/16 10:39 PM  Result Value Ref Range   Prothrombin Time 14.2 11.4 - 15.2 seconds   INR 1.10   Glucose, capillary     Status: Abnormal   Collection Time: 12/11/16  2:14 AM   Result Value Ref Range   Glucose-Capillary 216 (H) 65 - 99 mg/dL  Magnesium     Status: None   Collection Time: 12/11/16  5:22 AM  Result Value Ref Range   Magnesium 1.7 1.7 - 2.4 mg/dL  Glucose, capillary     Status: Abnormal   Collection Time: 12/11/16  7:43 AM  Result Value Ref Range   Glucose-Capillary 196 (H) 65 - 99 mg/dL    Current Facility-Administered Medications  Medication Dose Route Frequency Provider Last Rate Last Dose  . acetaminophen (TYLENOL) tablet 650 mg  650 mg Oral Q6H PRN Etta Quill, DO       Or  . acetaminophen (TYLENOL) suppository 650 mg  650 mg Rectal Q6H PRN Etta Quill, DO      . busPIRone (BUSPAR) tablet 15 mg  15 mg Oral TID Etta Quill, DO      . cefTRIAXone (ROCEPHIN) 1 g in dextrose 5 % 50 mL IVPB  1 g Intravenous QHS Etta Quill, DO   Stopped at 12/11/16 [phone]  . chlorproMAZINE (THORAZINE) tablet 50 mg  50 mg Oral TID Etta Quill, DO      . cloNIDine (CATAPRES) tablet 0.1 mg  0.1 mg Oral BID Etta Quill, DO   0.1 mg at 12/11/16 0227  . cyclobenzaprine (FLEXERIL) tablet 5 mg  5 mg Oral BID PRN Etta Quill, DO   5 mg at 12/11/16 0076  . dicyclomine (BENTYL) tablet 20 mg  20 mg Oral Q8H PRN Etta Quill, DO      . enoxaparin (LOVENOX) injection 40 mg  40 mg Subcutaneous Q24H Alcario Drought, Jared M, DO      . fenofibrate tablet 160 mg  160 mg Oral Daily Alcario Drought, Jared M, DO      . gabapentin (NEURONTIN) capsule 400 mg  400 mg Oral TID Etta Quill, DO      . insulin aspart (novoLOG) injection 0-20 Units  0-20 Units Subcutaneous TID WC Etta Quill, DO   4 Units at 12/11/16 [phone]  . insulin detemir (LEVEMIR) injection 50 Units  50 Units Subcutaneous BID Etta Quill, DO   50 Units at 12/11/16 0227  . levETIRAcetam (KEPPRA) tablet 500 mg  500 mg Oral BID Etta Quill, DO   500 mg at 12/11/16 3354  . lisinopril (PRINIVIL,ZESTRIL) tablet 10 mg  10 mg Oral Daily Jennette Kettle M, DO      . ondansetron [facility])  tablet 4 mg  4 mg Oral Q6H PRN Etta Quill, DO       Or  . ondansetron [facility]) injection 4 mg  4 mg Intravenous Q6H PRN Etta Quill, DO      . pantoprazole (PROTONIX) EC tablet 40 mg  40 mg Oral Daily Alcario Drought, Jared M, DO      . QUEtiapine (SEROQUEL) tablet 100 mg  100 mg Oral Daily Jennette Kettle M, DO      . QUEtiapine (SEROQUEL) tablet 800 mg  800 mg Oral QHS Jennette Kettle M, DO   800 mg at 12/11/16 5625  . sertraline (ZOLOFT) tablet 200 mg  200 mg  Oral Daily Jennette Kettle M, DO      . simvastatin (ZOCOR) tablet 10 mg  10 mg Oral QHS Jennette Kettle M, DO   10 mg at 12/11/16 7124  . topiramate (TOPAMAX) tablet 100 mg  100 mg Oral BID Jennette Kettle M, DO   100 mg at 12/11/16 5809  . traZODone (DESYREL) tablet 25 mg  25 mg Oral BID Jennette Kettle M, DO   25 mg at 12/11/16 0227    Musculoskeletal: Strength & Muscle Tone: within normal limits Gait & Station: normal Patient leans: N/A  Psychiatric Specialty Exam: Physical Exam  Nursing note and vitals reviewed. Constitutional: She is oriented to person, place, and time. She appears well-developed and well-nourished.  HENT:  Head: Normocephalic and atraumatic.  Neck: Normal range of motion.  Musculoskeletal: Normal range of motion.  Neurological: She is alert and oriented to person, place, and time.  Skin: No rash noted.  Psychiatric: She has a normal mood and affect. Her speech is normal and behavior is normal. Judgment and thought content normal. Cognition and memory are normal.    Review of Systems  Constitutional: Negative for chills and fever.  Gastrointestinal: Negative for constipation, diarrhea, nausea and vomiting.  Musculoskeletal: Negative for back pain and joint pain.    Blood pressure 115/78, pulse 92, temperature 98.1 F (36.7 C), temperature source Oral, resp. rate 16, height 5' 4"  (1.626 m), weight 108.5 kg (239 lb 1.6 oz), SpO2 97 %.Body mass index is 41.04 kg/m.  General Appearance: Young, obese,  Caucasian female with unbrushed hair, mildly malodorous and is lying on her side in bed. NAD.   Eye Contact:  Good  Speech:  Clear and Coherent and Normal Rate  Volume:  Normal  Mood:  Euthymic  Affect:  Full Range  Thought Process:  Goal Directed and Linear  Orientation:  Full (Time, Place, and Person)  Thought Content:  Logical  Suicidal Thoughts:  No  Homicidal Thoughts:  No  Memory:  Immediate;   Good Recent;   Good Remote;   Good  Judgement:  Fair  Insight:  Fair  Psychomotor Activity:  Normal  Concentration:  Concentration: Good and Attention Span: Good  Recall:  Good  Fund of Knowledge:  Good  Language:  Good  Akathisia:  No  Handed:  Right  AIMS (if indicated):   N/A  Assets:  Housing Social Support  ADL's:  Intact  Cognition:  WNL  Sleep:   Okay   Assessment:  Tina Saunders is a 31 y.o. female who was admitted with seizures. Psychiatry was consulted for management of high dose antipsychotic medications in the setting of seizures given concern to lower seizure threshold. Patient denies any active psychiatric problems at this time. She declines medication changes even when informed of the risks of using chronic and high dose antipsychotics (risk for lowering seizure threshold and metabolic syndrome). She agrees to discuss these concerns with the ACT team so no recommendations were provided today.   Treatment Plan Summary: -Patient declines making changes to psychotropic medications so no recommendations were provided at this time. She will follow up with her ACT team.  -Patient is psychiatrically cleared. Psychiatry will sign off at this time. Please consult psychiatry again as needed.   Disposition: No evidence of imminent risk to self or others at present.   Patient does not meet criteria for psychiatric inpatient admission.  Faythe Dingwall, DO 12/11/2016 9:10 AM

## 2016-12-11 NOTE — ED Notes (Signed)
Please call Jess [phone] for report at 01:15

## 2016-12-11 NOTE — Progress Notes (Signed)
Patient ID: Tina Saunders, female   DOB: 1985/10/08, 31 y.o.   MRN: [id number] 31 year old female was admitted early this morning for seizures.  Neurology has evaluated the patient.  Patient was seen and examined at bedside and plan of care was discussed with her.  I reviewed the patient's medical records and history and physical myself.  Continue current treatment plan.  Follow EEG.  Psychiatry consulted.  DC Rocephin as there is no evidence of pyuria.  Repeat a.m. labs

## 2016-12-11 NOTE — Progress Notes (Signed)
EEG completed; results pending.    
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
## 2016-12-11 NOTE — H&P (Signed)
History and Physical    Tina TRITCH NIO:[id number] DOB: Mar 06, 1985 DOA: 12/10/2016  PCP: Patient, No Pcp Per  Patient coming from: Home  I have personally briefly reviewed patient's old medical records in [place]  Chief Complaint: Seizures  HPI: Tina Saunders is a 31 y.o. female with medical history significant of numerous psych diagnosis, rhabdosarcoma.  Patient presents to the ED with c/o grand mal seziures.  Onset about a month ago, about 20 episodes or so in last month.  Seen in ED on 10/30 and started on keppra which hasnt really helped.  Episodes described as staring off into space then full body jerking with LOC.  Last 30-120 seconds.  No recent missed meds.   ED Course: Seen by Dr. Rory Percy.  CT head negative.  Getting admitted for MRI and EEG.   Review of Systems: As per HPI otherwise 10 point review of systems negative.   Past Medical History:  Diagnosis Date  . Anxiety   . Bipolar 1 disorder ([place])   . Cancer of abdominal wall   . Depression   . Diabetes mellitus without complication ([place])   . Hypertension   . Obesity   . Obesity   . Polycystic ovarian syndrome 07/01/2011   Patient report  . Rhabdosarcoma ([place])   . Schizophrenia [facility])     Past Surgical History:  Procedure Laterality Date  . CHOLECYSTECTOMY    . HERNIA REPAIR    . Ovarian Cyst Excision    . VARICOSE VEIN SURGERY       reports that  has never smoked. she has never used smokeless tobacco. She reports that she does not drink alcohol or use drugs.  Allergies  Allergen Reactions  . Fish-Derived Products Anaphylaxis    Can only eat FLounder  . Geodon [Ziprasidone Hcl] Other (See Comments)    Face pulls, cant swallow - Locked Jaw  . Haldol [Haloperidol Lactate] Other (See Comments)    Face pulls, can't swallow - Locked Jaw  . Buprenorphine Hcl Hives, Itching, Rash and Other (See Comments)    GI upset  . Compazine [Prochlorperazine] Other (See Comments)    anxiety and hyperactivity    . Morphine And Related Hives, Itching, Rash and Other (See Comments)    GI upset  . Toradol [Ketorolac Tromethamine] Other (See Comments)    Anxiety and hyperactivity    Family History  Problem Relation Age of Onset  . Coronary artery disease Maternal Grandmother   . Diabetes type II Maternal Grandmother   . Cancer Maternal Grandmother   . Hypertension Mother   . Hypertension Father      Prior to Admission medications   Medication Sig Start Date End Date Taking? Authorizing Provider  busPIRone (BUSPAR) 15 MG tablet Take 1 tablet (15 mg total) by mouth 3 (three) times daily. 11/04/16  Yes Derrill Center, NP  chlorproMAZINE (THORAZINE) 50 MG tablet Take 1 tablet (50 mg total) by mouth 2 (two) times daily. Patient taking differently: Take 50 mg by mouth 3 (three) times daily.  11/04/16  Yes Derrill Center, NP  cloNIDine (CATAPRES) 0.1 MG tablet Take 1 tablet (0.1 mg total) by mouth 2 (two) times daily. 11/04/16  Yes Derrill Center, NP  cyclobenzaprine (FLEXERIL) 5 MG tablet Take 5 mg by mouth 2 (two) times daily as needed for muscle spasms.   Yes [provider]  dicyclomine (BENTYL) 20 MG tablet Take 20 mg by mouth every 8 (eight) hours as needed for spasms.  Yes [provider]  Dulaglutide (TRULICITY) 1.5 XV/4.0GQ SOPN Inject 1 pen once a week into the skin.   Yes [provider]  fenofibrate (TRICOR) 145 MG tablet Take 145 mg daily by mouth.   Yes [provider]  gabapentin (NEURONTIN) 400 MG capsule Take 1 capsule (400 mg total) by mouth 3 (three) times daily. 11/04/16  Yes Derrill Center, NP  insulin aspart (NOVOLOG) 100 UNIT/ML injection Inject 22 Units 3 (three) times daily with meals as needed into the skin.   Yes [provider]  insulin detemir (LEVEMIR) 100 UNIT/ML injection Inject 0.7 mLs (70 Units total) into the skin 2 (two) times daily. Patient taking differently: Inject 68 Units into the skin 2 (two) times daily.   11/04/16  Yes Derrill Center, NP  levETIRAcetam (KEPPRA) 500 MG tablet Take 1 tablet (500 mg total) by mouth 2 (two) times daily. 11/21/16  Yes Sherwood Gambler, MD  lisinopril (PRINIVIL,ZESTRIL) 10 MG tablet Take 10 mg by mouth daily.   Yes [provider]  pantoprazole (PROTONIX) 40 MG tablet Take 40 mg by mouth daily.   Yes [provider]  QUEtiapine (SEROQUEL) 100 MG tablet Take 100 mg by mouth daily.   Yes [provider]  QUEtiapine (SEROQUEL) 400 MG tablet Take 2 tablets (800 mg total) by mouth at bedtime. 11/04/16  Yes Derrill Center, NP  sertraline (ZOLOFT) 100 MG tablet Take 2 tablets (200 mg total) by mouth every morning. 11/05/16  Yes Derrill Center, NP  simvastatin (ZOCOR) 10 MG tablet Take 1 tablet (10 mg total) by mouth at bedtime. 11/04/16  Yes Derrill Center, NP  topiramate (TOPAMAX) 100 MG tablet Take 1 tablet (100 mg total) by mouth 2 (two) times daily. 11/04/16  Yes Derrill Center, NP  traZODone (DESYREL) 50 MG tablet Take 1 tablet (50 mg total) by mouth at bedtime as needed for sleep. Patient taking differently: Take 25 mg by mouth 2 (two) times daily.  11/04/16  Yes Derrill Center, NP    Physical Exam: Vitals:   12/10/16 2037 12/10/16 2041  BP: 121/77   Resp: 18   SpO2: 97%   Weight:  108.4 kg (239 lb)  Height:  5\' 2"  (1.575 m)    Constitutional: NAD, calm, comfortable Eyes: PERRL, lids and conjunctivae normal ENMT: Mucous membranes are moist. Posterior pharynx clear of any exudate or lesions.Normal dentition.  Neck: normal, supple, no masses, no thyromegaly Respiratory: clear to auscultation bilaterally, no wheezing, no crackles. Normal respiratory effort. No accessory muscle use.  Cardiovascular: Regular rate and rhythm, no murmurs / rubs / gallops. No extremity edema. 2+ pedal pulses. No carotid bruits.  Abdomen: no tenderness, no masses palpated. No hepatosplenomegaly. Bowel sounds positive.  Musculoskeletal: no clubbing /  cyanosis. No joint deformity upper and lower extremities. Good ROM, no contractures. Normal muscle tone.  Skin: no rashes, lesions, ulcers. No induration Neurologic: CN 2-12 grossly intact. Sensation intact, DTR normal. Strength 5/5 in all 4.  Psychiatric: Normal judgment and insight. Alert and oriented x 3. Normal mood.    Labs on Admission: I have personally reviewed following labs and imaging studies  CBC: Recent Labs  Lab 12/10/16 2239  WBC 5.5  NEUTROABS 3.7  HGB 11.3*  HCT 35.0*  MCV 80.3  PLT 676*   Basic Metabolic Panel: Recent Labs  Lab 12/10/16 2239  NA 134*  K 3.9  CL 105  CO2 19*  GLUCOSE 226*  BUN 15  CREATININE 0.57  CALCIUM 9.3   GFR: Estimated Creatinine Clearance: 120.2 mL/min (by C-G formula based on SCr of 0.57 mg/dL). Liver Function Tests: Recent Labs  Lab 12/10/16 2239  AST 36  ALT 25  ALKPHOS 64  BILITOT 0.4  PROT 8.1  ALBUMIN 4.5   Recent Labs  Lab 12/10/16 2239  LIPASE 44   No results for input(s): AMMONIA in the last 168 hours. Coagulation Profile: Recent Labs  Lab 12/10/16 2239  INR 1.10   Cardiac Enzymes: No results for input(s): CKTOTAL, CKMB, CKMBINDEX, TROPONINI in the last 168 hours. BNP (last 3 results) No results for input(s): PROBNP in the last 8760 hours. HbA1C: No results for input(s): HGBA1C in the last 72 hours. CBG: Recent Labs  Lab 12/10/16 2101  GLUCAP 264*   Lipid Profile: No results for input(s): CHOL, HDL, LDLCALC, TRIG, CHOLHDL, LDLDIRECT in the last 72 hours. Thyroid Function Tests: No results for input(s): TSH, T4TOTAL, FREET4, T3FREE, THYROIDAB in the last 72 hours. Anemia Panel: No results for input(s): VITAMINB12, FOLATE, FERRITIN, TIBC, IRON, RETICCTPCT in the last 72 hours. Urine analysis:    Component Value Date/Time   COLORURINE YELLOW 12/10/2016 2214   APPEARANCEUR CLOUDY (A) 12/10/2016 2214   APPEARANCEUR Hazy 07/13/2011 0029   LABSPEC 1.024 12/10/2016 2214   LABSPEC 1.023  07/13/2011 0029   PHURINE 5.0 12/10/2016 2214   GLUCOSEU NEGATIVE 12/10/2016 2214   GLUCOSEU Negative 07/13/2011 0029   HGBUR NEGATIVE 12/10/2016 2214   HGBUR negative 10/16/2007 1450   BILIRUBINUR NEGATIVE 12/10/2016 2214   BILIRUBINUR Negative 07/13/2011 0029   KETONESUR NEGATIVE 12/10/2016 2214   PROTEINUR NEGATIVE 12/10/2016 2214   UROBILINOGEN 1.0 07/09/2014 1915   NITRITE NEGATIVE 12/10/2016 2214   LEUKOCYTESUR LARGE (A) 12/10/2016 2214   LEUKOCYTESUR Trace 07/13/2011 0029    Radiological Exams on Admission: Ct Head Wo Contrast  Result Date: 12/10/2016 CLINICAL DATA:  Left lower abdominal pain radiating to the low back for 3 days. Seizures today. History of diabetes. EXAM: CT HEAD WITHOUT CONTRAST TECHNIQUE: Contiguous axial images were obtained from the base of the skull through the vertex without intravenous contrast. COMPARISON:  11/08/2016 FINDINGS: Brain: No evidence of acute infarction, hemorrhage, hydrocephalus, extra-axial collection or mass lesion/mass effect. Vascular: No hyperdense vessel or unexpected calcification. Skull: Normal. Negative for fracture or focal lesion. 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 Paranasal sinuses are otherwise clear. No acute air-fluid levels. Mastoid air cells are clear. Other: None. IMPRESSION: No acute intracranial abnormalities. Electronically Signed   By: Lucienne Capers M.D.   On: 12/10/2016 23:28    EKG: Independently reviewed.  Assessment/Plan Principal Problem:   Seizures ([place]) Active Problems:   Acute lower UTI   DM2 (diabetes mellitus, type 2) ([place])    1. Seizures - 1. MRI with and without 2. EEG 3. Neuro consult 4. Per neuro continue current seizure meds for now without change 2. DM2 - 1. Reduce home levemir to 50 BID while inpatient 2. Resistant scale SSI AC 3. UTI - 1. Culture pending 2. Rocephin  DVT prophylaxis: Lovenox Code Status: Full Family  Communication: No family in room Disposition Plan: Home after admit Consults called: None Admission status: Place in obs   [place], [place] [facility] Pager [phone]  If 7AM-7PM, please contact day team taking care of patient [email or web address] Password [email or web address]  12/11/2016, 12:58 AM

## 2016-12-11 NOTE — Procedures (Signed)
ELECTROENCEPHALOGRAM REPORT  Date of Study: 12/11/2016  Patient's Name: Tina Saunders MRN: [id number] Date of Birth: [birth year]  Referring Provider: Dr. Jennette Kettle  Clinical History: This is a 31 year old woman with seizures.  Medications: levETIRAcetam (KEPPRA) tablet 500 mg  topiramate (TOPAMAX) tablet 100 mg  gabapentin (NEURONTIN) capsule 400 mg  acetaminophen (TYLENOL) tablet 650 mg  busPIRone (BUSPAR) tablet 15 mg  chlorproMAZINE (THORAZINE) tablet 50 mg  cloNIDine (CATAPRES) tablet 0.1 mg  cyclobenzaprine (FLEXERIL) tablet 5 mg  dicyclomine (BENTYL) tablet 20 mg  enoxaparin (LOVENOX) injection 40 mg  fenofibrate tablet 160 mg  insulin aspart (novoLOG) injection 0-20 Units  insulin detemir (LEVEMIR) injection 50 Units  lisinopril (PRINIVIL,ZESTRIL) tablet 10 mg  pantoprazole (PROTONIX) EC tablet 40 mg  QUEtiapine (SEROQUEL) tablet 100 mg  QUEtiapine (SEROQUEL) tablet 800 mg  sertraline (ZOLOFT) tablet 200 mg  simvastatin (ZOCOR) tablet 10 mg  traZODone (DESYREL) tablet 25 mg   Technical Summary: A multichannel digital EEG recording measured by the international 10-20 system with electrodes applied with paste and impedances below 5000 ohms performed in our laboratory with EKG monitoring in an awake and drowsy patient.  Hyperventilation and photic stimulation were not performed.  The digital EEG was referentially recorded, reformatted, and digitally filtered in a variety of bipolar and referential montages for optimal display.    Description: The patient is awake and drowsy during the recording.  During maximal wakefulness, there is a symmetric, medium voltage 9-9.5 Hz posterior dominant rhythm that attenuates with eye opening.  The record is symmetric.  During drowsiness, there is an increase in theta slowing of the background.Deeper stages of sleep were not captured.  Hyperventilation and photic stimulation were not performed.  There were no epileptiform discharges or  electrographic seizures seen.    EKG lead was unremarkable.  Impression: This awake and drowsy EEG is normal.    Clinical Correlation: A normal EEG does not exclude a clinical diagnosis of epilepsy.  Clinical correlation is advised.   Ellouise Newer, M.D.

## 2016-12-11 NOTE — Care Management Obs Status (Signed)
[facility] NOTIFICATION   Patient Details  Name: AZHARIA SURRATT MRN: [id number] Date of Birth: 10-Jan-1986   Medicare Observation Status Notification Given:  Yes    MahabirJuliann Pulse, RN 12/11/2016, 4:25 PM

## 2016-12-11 NOTE — Progress Notes (Signed)
Inpatient Diabetes Program Recommendations  AACE/ADA: New Consensus Statement on Inpatient Glycemic Control (2015)  Target Ranges:  Prepandial:   less than 140 mg/dL      Peak postprandial:   less than 180 mg/dL (1-2 hours)      Critically ill patients:  140 - 180 mg/dL   Lab Results  Component Value Date   GLUCAP 196 (H) 12/11/2016   HGBA1C 10.9 (H) 11/03/2016    Review of Glycemic Control  Diabetes history: DM2 Outpatient Diabetes medications: Lantus 50 units bid, Novolog 22 units tidwc Current orders for Inpatient glycemic control: Lantus 50 units bid, Novolog 0-20 units   Inpatient Diabetes Program Recommendations:    Add Novolog 8 units tidwc for meal coverage insulin if pt eats > 50% meal.  Will speak with pt regarding her HgbA1C results of 10.9%  Continue to follow.   Thank you. Lorenda Peck, RD, LDN, CDE Inpatient Diabetes Coordinator [phone]

## 2016-12-12 DIAGNOSIS — E119 Type 2 diabetes mellitus without complications: Secondary | ICD-10-CM | POA: Diagnosis not present

## 2016-12-12 DIAGNOSIS — F6089 Other specific personality disorders: Secondary | ICD-10-CM | POA: Diagnosis not present

## 2016-12-12 DIAGNOSIS — R569 Unspecified convulsions: Secondary | ICD-10-CM | POA: Diagnosis not present

## 2016-12-12 DIAGNOSIS — F3160 Bipolar disorder, current episode mixed, unspecified: Secondary | ICD-10-CM

## 2016-12-12 DIAGNOSIS — Z794 Long term (current) use of insulin: Secondary | ICD-10-CM | POA: Diagnosis not present

## 2016-12-12 LAB — URINE CULTURE

## 2016-12-12 LAB — COMPREHENSIVE METABOLIC PANEL
ALT: 21 U/L (ref 14–54)
AST: 30 U/L (ref 15–41)
Albumin: 4.2 g/dL (ref 3.5–5.0)
Alkaline Phosphatase: 64 U/L (ref 38–126)
Anion gap: 9 (ref 5–15)
BILIRUBIN TOTAL: 0.3 mg/dL (ref 0.3–1.2)
BUN: 12 mg/dL (ref 6–20)
CO2: 21 mmol/L — ABNORMAL LOW (ref 22–32)
CREATININE: 0.52 mg/dL (ref 0.44–1.00)
Calcium: 9.2 mg/dL (ref 8.9–10.3)
Chloride: 109 mmol/L (ref 101–111)
GFR calc Af Amer: >60 mL/min (ref >60)
Glucose, Bld: 176 mg/dL — ABNORMAL HIGH (ref 65–99)
POTASSIUM: 4 mmol/L (ref 3.5–5.1)
Sodium: 139 mmol/L (ref 135–145)
TOTAL PROTEIN: 7.5 g/dL (ref 6.5–8.1)

## 2016-12-12 LAB — GLUCOSE, CAPILLARY
GLUCOSE-CAPILLARY: 166 mg/dL — AB (ref 65–99)
Glucose-Capillary: 185 mg/dL — ABNORMAL HIGH (ref 65–99)

## 2016-12-12 LAB — CBC WITH DIFFERENTIAL/PLATELET
BASOS ABS: 0 10*3/uL (ref 0.0–0.1)
Basophils Relative: 0 %
Eosinophils Absolute: 0.1 10*3/uL (ref 0.0–0.7)
Eosinophils Relative: 2 %
HEMATOCRIT: 34.2 % — AB (ref 36.0–46.0)
Hemoglobin: 10.9 g/dL — ABNORMAL LOW (ref 12.0–15.0)
LYMPHS ABS: 1.5 10*3/uL (ref 0.7–4.0)
LYMPHS PCT: 37 %
MCH: 25.8 pg — ABNORMAL LOW (ref 26.0–34.0)
MCHC: 31.9 g/dL (ref 30.0–36.0)
MCV: 80.9 fL (ref 78.0–100.0)
MONO ABS: 0.3 10*3/uL (ref 0.1–1.0)
MONOS PCT: 7 %
NEUTROS ABS: 2.2 10*3/uL (ref 1.7–7.7)
Neutrophils Relative %: 54 %
Platelets: 122 10*3/uL — ABNORMAL LOW (ref 150–400)
RBC: 4.23 MIL/uL (ref 3.87–5.11)
RDW: 15.1 % (ref 11.5–15.5)
WBC: 4 10*3/uL (ref 4.0–10.5)

## 2016-12-12 LAB — MAGNESIUM: MAGNESIUM: 1.8 mg/dL (ref 1.7–2.4)

## 2016-12-12 MED ORDER — INSULIN DETEMIR 100 UNIT/ML ~~LOC~~ SOLN: 68.0000 [IU] | Freq: Two times a day (BID) | SUBCUTANEOUS | Status: DC

## 2016-12-12 MED ORDER — CHLORPROMAZINE HCL 50 MG PO TABS: 50.0000 mg | ORAL_TABLET | Freq: Three times a day (TID) | ORAL | Status: DC

## 2016-12-12 NOTE — Progress Notes (Signed)
EEG completed on 11/19 has been read. The EEG is normal.   MRI with and without contrast completed 11/19 also was normal.   Recommendations: 1. Continue her home doses of Keppra 500 twice daily, gabapentin 400 3 times daily and Topamax 100 twice daily. 2. Given that the EEG and MRI were unrevealing, she might require long-term EEG monitoring as an outpatient and outpatient neurology follow-up Neurology follow up for seizure vs pseudoseizure. 3. Follow up with her Psychiatrist with ultimate goal from Neurology standpoint being that her depression/anxiety/schizophrenia medications be optimized as she is on multiple medications that can lower seizure threshold. We are hesitant to make changes to her psychiatric medications during this solitary visit.  Electronically signed: Dr. Kerney Elbe

## 2016-12-12 NOTE — Care Management Note (Signed)
Case Management Note  Patient Details  Name: Tina Saunders MRN: [id number] Date of Birth: May 29, 1985  Subjective/Objective: 31 y/o f admitted w/cluster personality d/o. From home.                   Action/Plan:d/c home.   Expected Discharge Date:  12/12/16               Expected Discharge Plan:  Home/Self Care  In-House Referral:     Discharge planning Services  CM Consult  Post Acute Care Choice:    Choice offered to:     DME Arranged:    DME Agency:     HH Arranged:    HH Agency:     Status of Service:  Completed, signed off  If discussed at [employer] of Stay Meetings, dates discussed:    Additional Comments:  Dessa Phi, RN 12/12/2016, 12:33 PM

## 2016-12-12 NOTE — Progress Notes (Signed)
PT Cancellation Note  Patient Details Name: Tina Saunders MRN: [id number] DOB: 1985/02/05   Cancelled Treatment:    Reason Eval/Treat Not Completed: PT screened, no needs identified, will sign off . The patient declined need for PT  At this time. Reports that she is  Discharged to home.  Dozing while PT speaking to her.   [name] 12/12/2016, 11:14 AM [name] PT [phone]

## 2016-12-12 NOTE — Discharge Summary (Signed)
Physician Discharge Summary  Tina Saunders OIN:[id number] DOB: 07/11/1985 DOA: 12/10/2016  PCP: Patient, No Pcp Per  Admit date: 12/10/2016 Discharge date: 12/12/2016  Admitted From: Home Disposition:  Home  Recommendations for Outpatient Follow-up:  1. Follow up with PCP in 1week 2. Follow-up with neurology in 1-2 weeks 3. 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  She was evaluated by neurology, MRI of the brain was negative.  EEG was negative for seizures.  Psychiatry has evaluated the patient and cleared the patient for discharge with outpatient follow-up with her own psychiatrist to make changes in her current medications.  Patient is seizure-free and is ready for discharge.  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  MRI of the patient is negative for any acute abnormality.  EEG is negative.  Neurology has cleared the patient for discharge on Keppra 500 mg twice a day, gabapentin 400 mg 3 times a day and Topamax 100 mg twice a day -Currently seizure-free - Outpatient referral to neurology  Bipolar disorder with anxiety and depression -Patient is on multiple medications.  Psychiatry has evaluated the patient and patient will need to follow-up with her own  psychiatrist to make adjustment in her medications as these medications can lower the seizure threshold.  Diabetes mellitus type 2 -Continue home insulin regimen.  Outpatient follow-up  UTI has been ruled out as there is been no pyuria.  Antibiotics discontinued  Morbid obesity -Outpatient follow-up   Discharge Instructions  Discharge Instructions    Ambulatory referral to Neurology   Complete by:  As directed    An appointment is requested in approximately: 1-2 weeks; recent hospitalization for seziures   Call MD for:  difficulty breathing, headache or visual disturbances   Complete by:  As directed    Call MD for:  extreme fatigue   Complete by:  As directed    Call MD for:  hives   Complete by:  As directed    Call MD for:  persistant dizziness or light-headedness   Complete by:  As directed    Call MD for:  persistant nausea and vomiting   Complete by:  As directed    Call MD for:  severe uncontrolled pain   Complete by:  As directed    Call MD for:  temperature >100.4   Complete by:  As directed    Diet Carb Modified   Complete by:  As directed    Increase activity slowly   Complete by:  As directed      Allergies as of 12/12/2016      Reactions   Fish-derived Products Anaphylaxis   Can only eat FLounder   Geodon [ziprasidone Hcl] Other (See Comments)   Face pulls, cant swallow - Locked Jaw   Haldol [haloperidol Lactate] Other (See Comments)   Face pulls, can't swallow - Locked Jaw  Buprenorphine Hcl Hives, Itching, Rash, Other (See Comments)   GI upset   Compazine [prochlorperazine] Other (See Comments)   anxiety and hyperactivity   Morphine And Related Hives, Itching, Rash, Other (See Comments)   GI upset   Toradol [ketorolac Tromethamine] Other (See Comments)   Anxiety and hyperactivity      Medication List    TAKE these medications   busPIRone 15 MG tablet Commonly known as:  BUSPAR Take 1 tablet (15 mg total) by mouth 3 (three) times daily.    chlorproMAZINE 50 MG tablet Commonly known as:  THORAZINE Take 1 tablet (50 mg total) by mouth 3 (three) times daily.   cloNIDine 0.1 MG tablet Commonly known as:  CATAPRES Take 1 tablet (0.1 mg total) by mouth 2 (two) times daily.   cyclobenzaprine 5 MG tablet Commonly known as:  FLEXERIL Take 5 mg by mouth 2 (two) times daily as needed for muscle spasms.   dicyclomine 20 MG tablet Commonly known as:  BENTYL Take 20 mg by mouth every 8 (eight) hours as needed for spasms.   fenofibrate 145 MG tablet Commonly known as:  TRICOR Take 145 mg daily by mouth.   gabapentin 400 MG capsule Commonly known as:  NEURONTIN Take 1 capsule (400 mg total) by mouth 3 (three) times daily.   insulin aspart 100 UNIT/ML injection Commonly known as:  novoLOG Inject 22 Units 3 (three) times daily with meals as needed into the skin.   insulin detemir 100 UNIT/ML injection Commonly known as:  LEVEMIR Inject 0.68 mLs (68 Units total) into the skin 2 (two) times daily.   levETIRAcetam 500 MG tablet Commonly known as:  KEPPRA Take 1 tablet (500 mg total) by mouth 2 (two) times daily.   lisinopril 10 MG tablet Commonly known as:  PRINIVIL,ZESTRIL Take 10 mg by mouth daily.   pantoprazole 40 MG tablet Commonly known as:  PROTONIX Take 40 mg by mouth daily.   QUEtiapine 100 MG tablet Commonly known as:  SEROQUEL Take 100 mg by mouth daily.   QUEtiapine 400 MG tablet Commonly known as:  SEROQUEL Take 2 tablets (800 mg total) by mouth at bedtime.   sertraline 100 MG tablet Commonly known as:  ZOLOFT Take 2 tablets (200 mg total) by mouth every morning.   simvastatin 10 MG tablet Commonly known as:  ZOCOR Take 1 tablet (10 mg total) by mouth at bedtime.   topiramate 100 MG tablet Commonly known as:  TOPAMAX Take 1 tablet (100 mg total) by mouth 2 (two) times daily.   traZODone 50 MG tablet Commonly known as:  DESYREL Take 1 tablet (50 mg total) by mouth at bedtime as needed for  sleep. What changed:    how much to take  when to take this   TRULICITY 1.5 FU/9.3AT Sopn Generic drug:  Dulaglutide Inject 1 pen once a week into the skin.       Allergies  Allergen Reactions  . Fish-Derived Products Anaphylaxis    Can only eat FLounder  . Geodon [Ziprasidone Hcl] Other (See Comments)    Face pulls, cant swallow - Locked Jaw  . Haldol [Haloperidol Lactate] Other (See Comments)    Face pulls, can't swallow - Locked Jaw  . Buprenorphine Hcl Hives, Itching, Rash and Other (See Comments)    GI upset  . Compazine [Prochlorperazine] Other (See Comments)    anxiety and hyperactivity  . Morphine And Related Hives, Itching, Rash and Other (See Comments)    GI upset  .  Toradol [Ketorolac Tromethamine] Other (See Comments)    Anxiety and hyperactivity    Consultations: Neurology and psychiatry  Procedures/Studies: Ct Head Wo Contrast  Result Date: 12/10/2016 CLINICAL DATA:  Left lower abdominal pain radiating to the low back for 3 days. Seizures today. History of diabetes. EXAM: CT HEAD WITHOUT CONTRAST TECHNIQUE: Contiguous axial images were obtained from the base of the skull through the vertex without intravenous contrast. COMPARISON:  11/08/2016 FINDINGS: Brain: No evidence of acute infarction, hemorrhage, hydrocephalus, extra-axial collection or mass lesion/mass effect. Vascular: No hyperdense vessel or unexpected calcification. Skull: Normal. Negative for fracture or focal lesion. 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 Paranasal sinuses are otherwise clear. No acute air-fluid levels. Mastoid air cells are clear. Other: None. IMPRESSION: No acute intracranial abnormalities. Electronically Signed   By: Lucienne Capers M.D.   On: 12/10/2016 23:28   Mr Jeri Cos BM Contrast  Result Date: 12/11/2016 CLINICAL DATA:  New onset seizure.  History rhabdomyosarcoma EXAM: MRI HEAD WITHOUT AND WITH CONTRAST  TECHNIQUE: Multiplanar, multiecho pulse sequences of the brain and surrounding structures were obtained without and with intravenous contrast. CONTRAST:  20 mL MultiHance IV COMPARISON:  CT head 12/10/2016 FINDINGS: Brain: No acute infarction, hemorrhage, hydrocephalus, extra-axial collection or mass lesion. 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 This is stable from prior studies. Normal orbit. Other: Non IMPRESSION: Negative MRI of the brain with contrast. Electronically Signed   By: Franchot Gallo M.D.   On: 12/11/2016 07:10   US Pelvis Complete  Result Date: 11/21/2016 CLINICAL DATA:  Right flank pain. EXAM: TRANSABDOMINAL ULTRASOUND OF PELVIS TECHNIQUE: Transabdominal ultrasound examination of the pelvis was performed including evaluation of the uterus, ovaries, adnexal regions, and pelvic cul-de-sac. COMPARISON:  CT and ultrasound 10/26/2016 FINDINGS: Uterus Measurements: 5.1 x 2.3 x 3.4 cm. No gross fibroid or focal lesion, limited evaluation. Endometrium Not well-defined. Right ovary Not visualized. Left ovary Not visualized. Other findings:  No abnormal free fluid. IMPRESSION: Habitus limited exam. Uterus tentatively identified and normal in size. Ovaries are not visualized. Electronically Signed   By: Jeb Levering M.D.   On: 11/21/2016 22:14   US Renal  Result Date: 11/21/2016 CLINICAL DATA:  Right flank pain EXAM: RENAL / URINARY TRACT ULTRASOUND COMPLETE COMPARISON:  CT abdomen pelvis 10/26/2016 FINDINGS: Examination sensitivity is degraded by patient body habitus. Right Kidney: Length: 12.5 cm. Echogenicity within normal limits. No mass or hydronephrosis visualized. Left Kidney: Length: 11.5 cm. Echogenicity within normal limits. No mass or hydronephrosis visualized. Bladder: Appears normal for degree of bladder  distention. IMPRESSION: Normal renal ultrasound within the context of reduced sensitivity secondary to patient body habitus. Electronically Signed   By: Ulyses Jarred M.D.   On: 11/21/2016 22:18    EEG on 12/11/2016 was normal   Subjective: Patient seen and examined at bedside.  She denies any overnight seizures.  She feels better and wants to go home.  Discharge Exam: Vitals:   12/12/16 0535 12/12/16 0832  BP: (!) 96/57 (!) 145/94  Pulse: 86 99  Resp: 16 18  Temp: 97.7 F (36.5 C)   SpO2: 96% 100%   Vitals:   12/11/16 1300 12/11/16 2209 12/12/16 0535 12/12/16 0832  BP: 120/75 102/63 (!) 96/57 (!) 145/94  Pulse: 95 83 86 99  Resp: 16 16 16 18   Temp: 98.9 F (37.2 C) (!) 97.3 F (  36.3 C) 97.7 F (36.5 C)   TempSrc: Oral Oral Oral   SpO2: 98% 97% 96% 100%  Weight:      Height:        General: Pt is alert, awake, not in acute distress Cardiovascular: Rate controlled, S1/S2 + Respiratory: Bilateral decreased breath sounds at bases  abdominal: Soft, morbidly obese, NT, ND, bowel sounds + Extremities: no edema, no cyanosis    The results of significant diagnostics from this hospitalization (including imaging, microbiology, ancillary and laboratory) are listed below for reference.     Microbiology: Recent Results (from the past 240 hour(s))  Urine culture     Status: Abnormal   Collection Time: 12/10/16 10:14 PM  Result Value Ref Range Status   Specimen Description URINE, RANDOM  Final   Special Requests NONE  Final   Culture MULTIPLE SPECIES PRESENT, SUGGEST RECOLLECTION (A)  Final   Report Status 12/12/2016 FINAL  Final     Labs: BNP (last 3 results) No results for input(s): BNP in the last 8760 hours. Basic Metabolic Panel: Recent Labs  Lab 12/10/16 2239 12/11/16 0522 12/12/16 0447  NA 134*  --  139  K 3.9  --  4.0  CL 105  --  109  CO2 19*  --  21*  GLUCOSE 226*  --  176*  BUN 15  --  12  CREATININE 0.57  --  0.52  CALCIUM 9.3  --  9.2  MG  --   1.7 1.8   Liver Function Tests: Recent Labs  Lab 12/10/16 2239 12/12/16 0447  AST 36 30  ALT 25 21  ALKPHOS 64 64  BILITOT 0.4 0.3  PROT 8.1 7.5  ALBUMIN 4.5 4.2   Recent Labs  Lab 12/10/16 2239  LIPASE 44   No results for input(s): AMMONIA in the last 168 hours. CBC: Recent Labs  Lab 12/10/16 2239 12/12/16 0447  WBC 5.5 4.0  NEUTROABS 3.7 2.2  HGB 11.3* 10.9*  HCT 35.0* 34.2*  MCV 80.3 80.9  PLT 134* 122*   Cardiac Enzymes: No results for input(s): CKTOTAL, CKMB, CKMBINDEX, TROPONINI in the last 168 hours. BNP: Invalid input(s): POCBNP CBG: Recent Labs  Lab 12/11/16 0743 12/11/16 1141 12/11/16 1613 12/11/16 2207 12/12/16 0733  GLUCAP 196* 282* 218* 185* 166*   D-Dimer No results for input(s): DDIMER in the last 72 hours. Hgb A1c No results for input(s): HGBA1C in the last 72 hours. Lipid Profile No results for input(s): CHOL, HDL, LDLCALC, TRIG, CHOLHDL, LDLDIRECT in the last 72 hours. Thyroid function studies No results for input(s): TSH, T4TOTAL, T3FREE, THYROIDAB in the last 72 hours.  Invalid input(s): FREET3 Anemia work up No results for input(s): VITAMINB12, FOLATE, FERRITIN, TIBC, IRON, RETICCTPCT in the last 72 hours. Urinalysis    Component Value Date/Time   COLORURINE YELLOW 12/10/2016 2214   APPEARANCEUR CLOUDY (A) 12/10/2016 2214   APPEARANCEUR Hazy 07/13/2011 0029   LABSPEC 1.024 12/10/2016 2214   LABSPEC 1.023 07/13/2011 0029   PHURINE 5.0 12/10/2016 2214   GLUCOSEU NEGATIVE 12/10/2016 2214   GLUCOSEU Negative 07/13/2011 0029   HGBUR NEGATIVE 12/10/2016 2214   HGBUR negative 10/16/2007 1450   BILIRUBINUR NEGATIVE 12/10/2016 2214   BILIRUBINUR Negative 07/13/2011 0029   KETONESUR NEGATIVE 12/10/2016 2214   PROTEINUR NEGATIVE 12/10/2016 2214   UROBILINOGEN 1.0 07/09/2014 1915   NITRITE NEGATIVE 12/10/2016 2214   LEUKOCYTESUR LARGE (A) 12/10/2016 2214   LEUKOCYTESUR Trace 07/13/2011 0029   Sepsis Labs Invalid input(s):  PROCALCITONIN,  WBC,  [facility] Recent Results (from the past 240 hour(s))  Urine culture     Status: Abnormal   Collection Time: 12/10/16 10:14 PM  Result Value Ref Range Status   Specimen Description URINE, RANDOM  Final   Special Requests NONE  Final   Culture MULTIPLE SPECIES PRESENT, SUGGEST RECOLLECTION (A)  Final   Report Status 12/12/2016 FINAL  Final     Time coordinating discharge: 35 minutes  SIGNED:   Aline August, MD  [facility] 12/12/2016, 10:32 AM Pager: [phone]  If 7PM-7AM, please contact night-coverage [email or web address] Password [email or web address]

## 2016-12-13 ENCOUNTER — Emergency Department (HOSPITAL_COMMUNITY)
Admission: EM | Admit: 2016-12-13 | Discharge: 2016-12-13 | Disposition: A | Discharge status: Home / Self Care | Payer: Medicare HMO | Attending: Emergency Medicine | Admitting: Emergency Medicine

## 2016-12-13 ENCOUNTER — Encounter (HOSPITAL_COMMUNITY): Payer: Self-pay | Admitting: Emergency Medicine

## 2016-12-13 ENCOUNTER — Other Ambulatory Visit: Payer: Self-pay

## 2016-12-13 DIAGNOSIS — Z79899 Other long term (current) drug therapy: Secondary | ICD-10-CM | POA: Insufficient documentation

## 2016-12-13 DIAGNOSIS — E119 Type 2 diabetes mellitus without complications: Secondary | ICD-10-CM | POA: Diagnosis not present

## 2016-12-13 DIAGNOSIS — R5381 Other malaise: Secondary | ICD-10-CM | POA: Diagnosis not present

## 2016-12-13 DIAGNOSIS — I10 Essential (primary) hypertension: Secondary | ICD-10-CM | POA: Insufficient documentation

## 2016-12-13 DIAGNOSIS — R1084 Generalized abdominal pain: Secondary | ICD-10-CM | POA: Diagnosis not present

## 2016-12-13 DIAGNOSIS — Z794 Long term (current) use of insulin: Secondary | ICD-10-CM | POA: Insufficient documentation

## 2016-12-13 DIAGNOSIS — R110 Nausea: Secondary | ICD-10-CM | POA: Diagnosis not present

## 2016-12-13 DIAGNOSIS — K5900 Constipation, unspecified: Secondary | ICD-10-CM | POA: Diagnosis not present

## 2016-12-13 DIAGNOSIS — K5909 Other constipation: Secondary | ICD-10-CM

## 2016-12-13 LAB — I-STAT BETA HCG BLOOD, ED (MC, WL, AP ONLY)

## 2016-12-13 MED ORDER — ONDANSETRON 4 MG PO TBDP
4.0000 mg | ORAL_TABLET | Freq: Once | ORAL | Status: AC
  Administered 2016-12-13: 4 mg via ORAL
  Filled 2016-12-13: qty 1

## 2016-12-13 MED ORDER — DICYCLOMINE HCL 10 MG/ML IM SOLN
20.0000 mg | Freq: Once | INTRAMUSCULAR | Status: AC
  Administered 2016-12-13: 20 mg via INTRAMUSCULAR
  Filled 2016-12-13: qty 2

## 2016-12-13 NOTE — ED Notes (Signed)
Bed: UJ81 Expected date:  Expected time:  Means of arrival:  Comments: 31 yr old abd pain, nausea

## 2016-12-13 NOTE — ED Notes (Signed)
Guestimated weight of BM >5 lbs brown formed. Urine specimen requested

## 2016-12-13 NOTE — ED Provider Notes (Signed)
TIME SEEN: 2:52 AM  CHIEF COMPLAINT: Constipation  HPI: Patient is a 31 year old female with history of schizophrenia, hypertension, diabetes, bipolar disorder, obesity who presents to the emergency department with constipation for the past several days.  Was able to have a bowel movement earlier today but reports it was small.  Has had abdominal cramping and nausea.  No vomiting.  No abdominal distention.  No fever.  Has tried lactulose and enema at home x2 without relief.  ROS: See HPI Constitutional: no fever  Eyes: no drainage  ENT: no runny nose   Cardiovascular:  no chest pain  Resp: no SOB  GI: no vomiting GU: no dysuria Integumentary: no rash  Allergy: no hives  Musculoskeletal: no leg swelling  Neurological: no slurred speech ROS otherwise negative  PAST MEDICAL HISTORY/PAST SURGICAL HISTORY:  Past Medical History:  Diagnosis Date  . Anxiety   . Bipolar 1 disorder ([place])   . Cancer of abdominal wall   . Depression   . Diabetes mellitus without complication ([place])   . Hypertension   . Obesity   . Obesity   . Polycystic ovarian syndrome 07/01/2011   Patient report  . 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 Schizophrenia ([place])     MEDICATIONS:  Prior to Admission medications   Medication Sig Start Date End Date Taking? Authorizing Provider  busPIRone (BUSPAR) 15 MG tablet Take 1 tablet (15 mg total) by mouth 3 (three) times daily. 11/04/16  Yes Derrill Center, NP  chlorproMAZINE (THORAZINE) 50 MG tablet Take 1 tablet (50 mg total) by mouth 3 (three) times daily. 12/12/16  Yes Aline August, MD  cloNIDine (CATAPRES) 0.1 MG tablet Take 1 tablet (0.1 mg total) by mouth 2 (two) times daily. 11/04/16  Yes Derrill Center, NP  cyclobenzaprine (FLEXERIL) 5 MG tablet Take 5 mg by mouth 2 (two) times daily as needed for muscle spasms.   Yes [provider]  dicyclomine (BENTYL) 20 MG tablet Take 20 mg by mouth every 8 (eight) hours as needed for spasms.   Yes [provider]   Dulaglutide (TRULICITY) 1.5 DQ/2.2WL SOPN Inject 1 pen into the skin every Friday.    Yes [provider]  fenofibrate (TRICOR) 145 MG tablet Take 145 mg daily by mouth.   Yes [provider]  gabapentin (NEURONTIN) 400 MG capsule Take 1 capsule (400 mg total) by mouth 3 (three) times daily. 11/04/16  Yes Derrill Center, NP  insulin aspart (NOVOLOG) 100 UNIT/ML injection Inject 22 Units 3 (three) times daily with meals as needed into the skin.   Yes [provider]  insulin detemir (LEVEMIR) 100 UNIT/ML injection Inject 0.68 mLs (68 Units total) into the skin 2 (two) times daily. 12/12/16  Yes Aline August, MD  levETIRAcetam (KEPPRA) 500 MG tablet Take 1 tablet (500 mg total) by mouth 2 (two) times daily. 11/21/16  Yes Sherwood Gambler, MD  lisinopril (PRINIVIL,ZESTRIL) 10 MG tablet Take 10 mg by mouth daily.   Yes [provider]  pantoprazole (PROTONIX) 40 MG tablet Take 40 mg by mouth daily.   Yes [provider]  QUEtiapine (SEROQUEL) 100 MG tablet Take 100 mg by mouth daily.   Yes [provider]  QUEtiapine (SEROQUEL) 400 MG tablet Take 2 tablets (800 mg total) by mouth at bedtime. 11/04/16  Yes Derrill Center, NP  sertraline (ZOLOFT) 100 MG tablet Take 2 tablets (200 mg total) by mouth every morning. 11/05/16  Yes Derrill Center, NP  simvastatin (ZOCOR) 10  MG tablet Take 1 tablet (10 mg total) by mouth at bedtime. 11/04/16  Yes Derrill Center, NP  topiramate (TOPAMAX) 100 MG tablet Take 1 tablet (100 mg total) by mouth 2 (two) times daily. 11/04/16  Yes Derrill Center, NP  traZODone (DESYREL) 50 MG tablet Take 1 tablet (50 mg total) by mouth at bedtime as needed for sleep. Patient taking differently: Take 25 mg by mouth 2 (two) times daily.  11/04/16  Yes Derrill Center, NP    ALLERGIES:  Allergies  Allergen Reactions  . Fish-Derived Products Anaphylaxis    Can only eat FLounder  . Geodon [Ziprasidone Hcl] Other (See  Comments)    Face pulls, cant swallow - Locked Jaw  . Haldol [Haloperidol Lactate] Other (See Comments)    Face pulls, can't swallow - Locked Jaw  . Buprenorphine Hcl Hives, Itching, Rash and Other (See Comments)    GI upset  . Compazine [Prochlorperazine] Other (See Comments)    anxiety and hyperactivity  . Morphine And Related Hives, Itching, Rash and Other (See Comments)    GI upset  . Toradol [Ketorolac Tromethamine] Other (See Comments)    Anxiety and hyperactivity    SOCIAL HISTORY:  Social History   Tobacco Use  . Smoking status: Never Smoker  . Smokeless tobacco: Never Used  Substance Use Topics  . Alcohol use: No    FAMILY HISTORY: Family History  Problem Relation Age of Onset  . Coronary artery disease Maternal Grandmother   . Diabetes type II Maternal Grandmother   . Cancer Maternal Grandmother   . Hypertension Mother   . Hypertension Father     EXAM: BP 113/73   Pulse (!) 118   Temp 97.8 F (36.6 C) (Oral)   Resp 14   LMP 11/08/2016   SpO2 98%  CONSTITUTIONAL: Alert and oriented and responds appropriately to questions.  Obese, appears uncomfortable but is afebrile nontoxic HEAD: Normocephalic EYES: Conjunctivae clear, pupils appear equal, EOMI ENT: normal nose; moist mucous membranes NECK: Supple, no meningismus, no nuchal rigidity, no LAD  CARD: Regular and tachycardic; S1 and S2 appreciated; no murmurs, no clicks, no rubs, no gallops RESP: Normal chest excursion without splinting or tachypnea; breath sounds clear and equal bilaterally; no wheezes, no rhonchi, no rales, no hypoxia or respiratory distress, speaking full sentences ABD/GI: Normal bowel sounds; non-distended; soft, non-tender, no rebound, no guarding, no peritoneal signs, no hepatosplenomegaly RECTAL:  Normal rectal tone, no gross blood or melena, no hemorrhoids appreciated, nontender rectal exam, + fecal impaction BACK:  The back appears normal and is non-tender to palpation, there is no  CVA tenderness EXT: Normal ROM in all joints; non-tender to palpation; no edema; normal capillary refill; no cyanosis, no calf tenderness or swelling    SKIN: Normal color for age and race; warm; no rash NEURO: Moves all extremities equally PSYCH: The patient's mood and manner are appropriate. Grooming and personal hygiene are appropriate.  MEDICAL DECISION MAKING: Patient here with constipation.  Has fecal impaction on exam.  Will obtain urine pregnancy test and give Bentyl, Zofran and soapsuds enema for symptomatic relief.  Abdominal exam benign.  No vomiting.  No abdominal distention.  Doubt bowel obstruction.  Doubt appendicitis.  Doubt colitis.  ED PROGRESS: Patient has received soapsuds enema and has had a large bowel movement.  Reports feeling better.  We will fluid challenge patient and obtain urine pregnancy test.  Unable to obtain urine pregnancy test.  Blood pregnancy test is negative.  Not crossing  over from any lab but I have confirmed the results.  Will discharge patient home with instructions for high-fiber diet and over-the-counter medications to prevent constipation in the future.  Discussed return precautions.   At this time, I do not feel there is any life-threatening condition present. I have reviewed and discussed all results (EKG, imaging, lab, urine as appropriate) and exam findings with patient/family. I have reviewed nursing notes and appropriate previous records.  I feel the patient is safe to be discharged home without further emergent workup and can continue workup as an outpatient as needed. Discussed usual and customary return precautions. Patient/family verbalize understanding and are comfortable with this plan.  Outpatient follow-up has been provided if needed. All questions have been answered.    Harwood Nall, Delice Bison, DO 12/13/16 [phone]

## 2016-12-13 NOTE — ED Triage Notes (Signed)
Pt arrived via [facility] with c/o abd pain and constipation with last BM  About 1 week ago +, admits to nausea denies emesis also state unable to void last 5 to 6 hrs. After consuming large amount of tea VS BP:120/58 P: 128 , Resp: 18 Sat 97% RA

## 2016-12-13 NOTE — Discharge Instructions (Signed)
I recommend that you increase your water and fiber intake. If you are not able to eat foods high in fiber, you may use Benefiber or Metamucil over-the-counter. I also recommend you use MiraLAX 1-2 times a day and Colace 100 mg twice a day to help with bowel movements. These medications are over the counter.  You may use other over-the-counter medications such as Dulcolax, Fleet enemas, magnesium citrate as needed for constipation. Please note that some of these medications may cause you to have abdominal cramping which is normal. If you develop severe abdominal pain, fever, vomiting, distention of your abdomen, unable to have a bowel movement for 5 days or are not passing gas, please return to the hospital.

## 2016-12-26 DIAGNOSIS — E785 Hyperlipidemia, unspecified: Secondary | ICD-10-CM | POA: Diagnosis not present

## 2016-12-26 DIAGNOSIS — R4589 Other symptoms and signs involving emotional state: Secondary | ICD-10-CM | POA: Diagnosis not present

## 2016-12-26 DIAGNOSIS — E138 Other specified diabetes mellitus with unspecified complications: Secondary | ICD-10-CM | POA: Diagnosis not present

## 2017-01-12 ENCOUNTER — Ambulatory Visit (INDEPENDENT_AMBULATORY_CARE_PROVIDER_SITE_OTHER): Payer: Medicare HMO | Admitting: Gastroenterology

## 2017-01-12 ENCOUNTER — Encounter: Payer: Self-pay | Admitting: Gastroenterology

## 2017-01-12 VITALS — BP 114/82 | HR 70 | Ht 64.0 in | Wt 234.4 lb

## 2017-01-12 DIAGNOSIS — K5900 Constipation, unspecified: Secondary | ICD-10-CM | POA: Diagnosis not present

## 2017-01-12 DIAGNOSIS — R1032 Left lower quadrant pain: Secondary | ICD-10-CM | POA: Diagnosis not present

## 2017-01-12 MED ORDER — NA SULFATE-K SULFATE-MG SULF 17.5-3.13-1.6 GM/177ML PO SOLN: ORAL | 0 refills | Status: DC

## 2017-01-12 NOTE — Progress Notes (Signed)
HPI: This is a pleasant 31 year old woman who was referred Dr. Alphonzo Grieve for chronic abdominal pain  Chief complaint is "I hurt in my right lower quadrant" she says while pointing to her left lower quadrant  She has had chronic lower abdominal pain for many many years.  I see ER visits and CAT scans ordered as far back as 2014 for this complaint.  It is interesting that she says she hurts in her right lower quadrant but she is actually pointing to her left lower quadrant, left pelvis as the site of the pain.  The pain is constant but does seem to wax and wane somewhat.  She cannot point to any particular food or body positions that aggravate it.  Moving her bowels does not aggravate it.  She has had at least 19 CT abdominal scans in the past for 5 years, see those results summarized below.  She was told her pains were from scar tissue and that may indeed be correct  She has chronic constipation for which she has tried a variety of constipation remedies.  For the past year she has been taking lactulose syrup twice daily and this successfully treats her constipation but results in 3 or 4 very loose stools every day.  She never sees blood in her stool  She's had GB surgery; unbilical hernia repair surgery; she cannot recall when she had these surgeries done or really where they were done either  Overall weight is down 20 pounds in 4 monhts.    Old Data Reviewed: bloodwork 11/2016: cmet normal exept gluc 176; Hb 10.9 with slightly low MCV, plts 122, HIV neg, INR normal  Since early 2014 she has had 70 visits to the emergency room.  This includes [facility] and other facilities which take place in care everywhere  In that same interval, since early 2014 she has had at least 19 CT scans of her abdomen and pelvis; the most recent CT scan was October 2018 with IV and oral contrast of abdomen and pelvis.  This shows "no evidence of bowel obstruction, normal appendix, stable splenomegaly, no CT scan  findings to account for the patient's right lower quadrant abdominal pain" CT scans in August 2018 and also September 2018 are fairly similar both note hepatosplenomegaly with some fatty infiltration of the liver.      Review of systems: Pertinent positive and negative review of systems were noted in the above HPI section. All other review negative.   Past Medical History:  Diagnosis Date  . Anxiety   . Bipolar 1 disorder ([place])   . Cancer of abdominal wall   . Depression   . Diabetes mellitus without complication ([place])   . Hypertension   . Obesity   . Obesity   . Polycystic ovarian syndrome 07/01/2011   Patient report  . 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 Schizophrenia [facility])     Past Surgical History:  Procedure Laterality Date  . CHOLECYSTECTOMY    . HERNIA REPAIR    . Ovarian Cyst Excision    . VARICOSE VEIN SURGERY      Current Outpatient Medications  Medication Sig Dispense Refill  . busPIRone (BUSPAR) 15 MG tablet Take 1 tablet (15 mg total) by mouth 3 (three) times daily. 90 tablet 0  . chlorproMAZINE (THORAZINE) 50 MG tablet Take 1 tablet (50 mg total) by mouth 3 (three) times daily.    . cloNIDine (CATAPRES) 0.1 MG tablet Take 1 tablet (0.1 mg total) by mouth 2 (two) times daily. Finger  tablet 11  . cyclobenzaprine (FLEXERIL) 5 MG tablet Take 5 mg by mouth 2 (two) times daily as needed for muscle spasms.    Marland Kitchen dicyclomine (BENTYL) 20 MG tablet Take 20 mg by mouth every 8 (eight) hours as needed for spasms.    . Dulaglutide (TRULICITY) 1.5 ES/9.2ZR SOPN Inject 1 pen into the skin every Friday.     . fenofibrate (TRICOR) 145 MG tablet Take 145 mg daily by mouth.    . gabapentin (NEURONTIN) 400 MG capsule Take 1 capsule (400 mg total) by mouth 3 (three) times daily. 90 capsule 0  . insulin aspart (NOVOLOG) 100 UNIT/ML injection Inject 22 Units 3 (three) times daily with meals as needed into the skin.    Marland Kitchen insulin detemir (LEVEMIR) 100 UNIT/ML injection Inject 0.68 mLs (68 Units total)  into the skin 2 (two) times daily.    Marland Kitchen levETIRAcetam (KEPPRA) 500 MG tablet Take 1 tablet (500 mg total) by mouth 2 (two) times daily. 60 tablet 0  . lisinopril (PRINIVIL,ZESTRIL) 10 MG tablet Take 10 mg by mouth daily.    . pantoprazole (PROTONIX) 40 MG tablet Take 40 mg by mouth daily.    . QUEtiapine (SEROQUEL) 100 MG tablet Take 100 mg by mouth daily.    . QUEtiapine (SEROQUEL) 400 MG tablet Take 2 tablets (800 mg total) by mouth at bedtime. 60 tablet 0  . sertraline (ZOLOFT) 100 MG tablet Take 2 tablets (200 mg total) by mouth every morning. 60 tablet 0  . simvastatin (ZOCOR) 10 MG tablet Take 1 tablet (10 mg total) by mouth at bedtime. 30 tablet 0  . topiramate (TOPAMAX) 100 MG tablet Take 1 tablet (100 mg total) by mouth 2 (two) times daily. 60 tablet 0  . traZODone (DESYREL) 50 MG tablet Take 1 tablet (50 mg total) by mouth at bedtime as needed for sleep. (Patient taking differently: Take 25 mg by mouth 2 (two) times daily. ) 30 tablet 0   No current facility-administered medications for this visit.     Allergies as of 01/12/2017 - Review Complete 01/12/2017  Allergen Reaction Noted  . Fish-derived products Anaphylaxis 01/23/2014  . Geodon [ziprasidone hcl] Other (See Comments) 11/16/2012  . Haldol [haloperidol lactate] Other (See Comments) 11/16/2012  . Buprenorphine hcl Hives, Itching, Rash, and Other (See Comments) 01/23/2014  . Compazine [prochlorperazine] Other (See Comments) 07/26/2013  . Morphine and related Hives, Itching, Rash, and Other (See Comments) 07/28/2011  . Toradol [ketorolac tromethamine] Other (See Comments) 07/26/2013    Family History  Problem Relation Age of Onset  . Coronary artery disease Maternal Grandmother   . Diabetes type II Maternal Grandmother   . Cancer Maternal Grandmother   . Hypertension Mother   . Hypertension Father     Social History   Socioeconomic History  . Marital status: Single    Spouse name: Not on file  . Number of  children: 0  . Years of education: Not on file  . Highest education level: Not on file  Social Needs  . Financial resource strain: Not on file  . Food insecurity - worry: Not on file  . Food insecurity - inability: Not on file  . Transportation needs - medical: Not on file  . Transportation needs - non-medical: Not on file  Occupational History  . Not on file  Tobacco Use  . Smoking status: Never Smoker  . Smokeless tobacco: Never Used  Substance and Sexual Activity  . Alcohol use: No  . Drug use: No  .  Sexual activity: No    Birth control/protection: None  Other Topics Concern  . Not on file  Social History Narrative  . Not on file     Physical Exam: BP 114/82   Pulse 70   Ht _0  (1.626 m)   Wt 234 lb 6 oz (106.3 kg)   BMI 40.23 kg/m  Constitutional: generally well-appearing Psychiatric: alert and oriented x3 Eyes: extraocular movements intact Mouth: oral pharynx moist, no lesions Neck: supple no lymphadenopathy Cardiovascular: heart regular rate and rhythm Lungs: clear to auscultation bilaterally Abdomen: soft, nontender, nondistended, no obvious ascites, no peritoneal signs, normal bowel sounds Extremities: no lower extremity edema bilaterally Skin: no lesions on visible extremities   Assessment and plan: 31 y.o. female with chronic lower abdominal pain, chronic constipation  First she has chronic constipation.  She has been on a variety of regimens for this and most recently lactulose twice daily for the past year which results in 3 or 4 very loose stools a day.  She says if she were to take no lactulose that she would move her bowels about once every 2 weeks.  1 of her chief complaints is actually diarrhea interestingly.  I think the diarrhea is related to the lactulose and I recommended she cut back to one dose once daily to see how that does for her.    Second she has chronic lower abdominal pain.  She says this is in her right lower quadrant but she is  pointing to her left lower quadrant, left pelvis when she says this.  She has had 19 CT scans of her abdomen and pelvis over the past for 5 years.  None show a clear explanation for her abdominal symptoms.  I think it is possible she has adhesive disease from her umbilical hernia repair, gallbladder surgery in the past.  Given her extensive use of the emergency department system I think we should proceed with colonoscopy to at least exclude other potential causes such as Crohn's disease, neoplasm which I think are very unlikely.  Overall I am most suspicious of functional disease here.  We will arrange for colonoscopy at her soonest convenience at [facility] with MAC sedation I see no reason for any further blood tests or imaging studies prior to then     Please see the "Patient Instructions" section for addition details about the plan.   Owens Loffler, MD [facility] 01/12/2017, 10:10 AM

## 2017-01-12 NOTE — Addendum Note (Signed)
Addended by: Candie Mile on: 01/12/2017 01:30 PM   Modules accepted: Orders, SmartSet

## 2017-01-12 NOTE — Patient Instructions (Addendum)
Cut back lactulose to ONCE DAILY dosing. You will be set up for a colonoscopy at [facility] for abdominal pain, chronic  Constipation.  You have been scheduled for a colonoscopy. Please follow written instructions given to you at your visit today.  Please pick up your prep supplies at the pharmacy within the next 1-3 days. If you use inhalers (even only as needed), please bring them with you on the day of your procedure. Your physician has requested that you go to www.startemmi.com and enter the access code given to you at your visit today. This web site gives a general overview about your procedure. However, you should still follow specific instructions given to you by our office regarding your preparation for the procedure.  We have sent the following medications to your pharmacy for you to pick up at your convenience: Suprep  If you are age 91 or older, your body mass index should be between 23-30. Your Body mass index is 40.23 kg/m. If this is out of the aforementioned range listed, please consider follow up with your Primary Care Provider.  If you are age 56 or younger, your body mass index should be between 19-25. Your Body mass index is 40.23 kg/m. If this is out of the aformentioned range listed, please consider follow up with your Primary Care Provider.   Thank you for choosing me and [facility].   Owens Loffler, MD

## 2017-01-16 ENCOUNTER — Encounter (HOSPITAL_COMMUNITY): Payer: Self-pay | Admitting: Family Medicine

## 2017-01-16 DIAGNOSIS — R1031 Right lower quadrant pain: Secondary | ICD-10-CM | POA: Insufficient documentation

## 2017-01-16 DIAGNOSIS — N760 Acute vaginitis: Secondary | ICD-10-CM | POA: Insufficient documentation

## 2017-01-16 DIAGNOSIS — I10 Essential (primary) hypertension: Secondary | ICD-10-CM | POA: Diagnosis not present

## 2017-01-16 DIAGNOSIS — E1165 Type 2 diabetes mellitus with hyperglycemia: Secondary | ICD-10-CM | POA: Insufficient documentation

## 2017-01-16 DIAGNOSIS — Z79899 Other long term (current) drug therapy: Secondary | ICD-10-CM | POA: Diagnosis not present

## 2017-01-16 DIAGNOSIS — Z794 Long term (current) use of insulin: Secondary | ICD-10-CM | POA: Diagnosis not present

## 2017-01-16 DIAGNOSIS — B9689 Other specified bacterial agents as the cause of diseases classified elsewhere: Secondary | ICD-10-CM | POA: Diagnosis not present

## 2017-01-16 LAB — COMPREHENSIVE METABOLIC PANEL
ALT: 22 U/L (ref 14–54)
AST: 31 U/L (ref 15–41)
Albumin: 3.9 g/dL (ref 3.5–5.0)
Alkaline Phosphatase: 83 U/L (ref 38–126)
Anion gap: 10 (ref 5–15)
BILIRUBIN TOTAL: 0.4 mg/dL (ref 0.3–1.2)
BUN: 12 mg/dL (ref 6–20)
CHLORIDE: 105 mmol/L (ref 101–111)
CO2: 19 mmol/L — ABNORMAL LOW (ref 22–32)
CREATININE: 0.48 mg/dL (ref 0.44–1.00)
Calcium: 9.3 mg/dL (ref 8.9–10.3)
GFR calc Af Amer: >60 mL/min (ref >60)
Glucose, Bld: 323 mg/dL — ABNORMAL HIGH (ref 65–99)
Potassium: 4 mmol/L (ref 3.5–5.1)
Sodium: 134 mmol/L — ABNORMAL LOW (ref 135–145)
TOTAL PROTEIN: 7.7 g/dL (ref 6.5–8.1)

## 2017-01-16 LAB — CBC
HCT: 35.7 % — ABNORMAL LOW (ref 36.0–46.0)
Hemoglobin: 11.6 g/dL — ABNORMAL LOW (ref 12.0–15.0)
MCH: 25.7 pg — ABNORMAL LOW (ref 26.0–34.0)
MCHC: 32.5 g/dL (ref 30.0–36.0)
MCV: 79.2 fL (ref 78.0–100.0)
PLATELETS: 127 10*3/uL — AB (ref 150–400)
RBC: 4.51 MIL/uL (ref 3.87–5.11)
RDW: 15.8 % — AB (ref 11.5–15.5)
WBC: 5.1 10*3/uL (ref 4.0–10.5)

## 2017-01-16 LAB — URINALYSIS, ROUTINE W REFLEX MICROSCOPIC
Bilirubin Urine: NEGATIVE
Hgb urine dipstick: NEGATIVE
KETONES UR: NEGATIVE mg/dL
Nitrite: NEGATIVE
PH: 5 (ref 5.0–8.0)
Protein, ur: NEGATIVE mg/dL
SPECIFIC GRAVITY, URINE: 1.029 (ref 1.005–1.030)

## 2017-01-16 LAB — I-STAT BETA HCG BLOOD, ED (MC, WL, AP ONLY): I-stat hCG, quantitative: <5 m[IU]/mL (ref <5)

## 2017-01-16 LAB — LIPASE, BLOOD: Lipase: 40 U/L (ref 11–51)

## 2017-01-16 NOTE — ED Triage Notes (Signed)
Patient is from home and transported via [facility]. Patient is complaining of lower right abd pain with nausea. Denies vomiting and diarrhea. EMS reports she is negative for rebound tenderness and lung sounds clear. Patient was ambulatory to EMS truck.

## 2017-01-17 ENCOUNTER — Emergency Department (HOSPITAL_COMMUNITY)
Admission: EM | Admit: 2017-01-17 | Discharge: 2017-01-17 | Discharge status: Against Medical Advice | Payer: Medicare HMO | Attending: Emergency Medicine | Admitting: Emergency Medicine

## 2017-01-17 DIAGNOSIS — R739 Hyperglycemia, unspecified: Secondary | ICD-10-CM

## 2017-01-17 DIAGNOSIS — R1031 Right lower quadrant pain: Secondary | ICD-10-CM | POA: Diagnosis not present

## 2017-01-17 DIAGNOSIS — N760 Acute vaginitis: Secondary | ICD-10-CM

## 2017-01-17 DIAGNOSIS — B9689 Other specified bacterial agents as the cause of diseases classified elsewhere: Secondary | ICD-10-CM

## 2017-01-17 LAB — GC/CHLAMYDIA PROBE AMP (~~LOC~~) NOT AT ARMC
Chlamydia: NEGATIVE
Neisseria Gonorrhea: NEGATIVE

## 2017-01-17 LAB — WET PREP, GENITAL
SPERM: NONE SEEN
TRICH WET PREP: NONE SEEN
YEAST WET PREP: NONE SEEN

## 2017-01-17 MED ORDER — FENTANYL CITRATE (PF) 100 MCG/2ML IJ SOLN
50.0000 ug | Freq: Once | INTRAMUSCULAR | Status: AC
  Administered 2017-01-17: 50 ug via INTRAMUSCULAR
  Filled 2017-01-17: qty 2

## 2017-01-17 MED ORDER — METRONIDAZOLE 0.75 % VA GEL: 1.0000 | Freq: Two times a day (BID) | VAGINAL | 0 refills | Status: DC

## 2017-01-17 MED ORDER — ONDANSETRON 8 MG PO TBDP
8.0000 mg | ORAL_TABLET | Freq: Once | ORAL | Status: AC
Start: 2017-01-17 — End: 2017-01-17
  Administered 2017-01-17: 8 mg via ORAL
  Filled 2017-01-17: qty 1

## 2017-01-17 MED ORDER — DICYCLOMINE HCL 10 MG/ML IM SOLN
20.0000 mg | Freq: Once | INTRAMUSCULAR | Status: AC
  Administered 2017-01-17: 20 mg via INTRAMUSCULAR
  Filled 2017-01-17: qty 2

## 2017-01-17 NOTE — Discharge Instructions (Signed)
Use MetroGel as prescribed for bacterial vaginosis.  You have STD test pending.  You will be contacted if these are positive.  Your blood work today was reassuring and does not suggest a concerning cause of your abdominal pain.  Continue taking your diabetes medication and have your hCG level checked by her primary care doctor as your medications may need to be adjusted if your high blood sugar persists.  Continue with your daily Bentyl as well as ibuprofen.  We suggest follow-up with your gastroenterologist regarding persistence of your abdominal pain.  Continue follow-up with your OB/GYN as scheduled.  You may return to the ED, as needed, for new or concerning symptoms.

## 2017-01-17 NOTE — ED Provider Notes (Signed)
[facility] DEPT Provider Note   CSN: [id number] Arrival date & time: 01/16/17  2233    History   Chief Complaint Chief Complaint  Patient presents with  . Abdominal Pain    HPI Tina Saunders is a 31 y.o. female.  31 year old female with a history of polycystic ovarian syndrome, DM, anxiety, depression, hypertension, schizophrenia and bipolar 1 disorder presents to the emergency department for evaluation of right lower quadrant abdominal pain.  She states that pain is nonradiating and woke her from sleep this morning.  Pain has been constant and unrelieved with 4 doses of Motrin.  She has had associated nausea, but denies vomiting or diarrhea.  She had a normal bowel movement earlier today.  No dysuria, hematuria, urinary frequency or urgency.  She further denies vaginal complaints.  The patient is sexually active with one partner with whom she does not use condoms.  She denies current concern for STDs.  She states that she has had similar pain in the past, but this feels worse.  Abdominal surgical history significant for ovarian cyst excision, hernia repair, cholecystectomy.  She notes her prior pain to be secondary to a urinary tract infection or a ruptured ovarian cyst.  She has been seen 3 times this year for complaints of right lower quadrant pain.  Each visit with a negative CT scan.  She has also had multiple pelvic ultrasounds which were reassuring, but unable to visualize the adnexa   The history is provided by the patient. No language interpreter was used.  Abdominal Pain      Past Medical History:  Diagnosis Date  . Anxiety   . Bipolar 1 disorder ([place])   . Cancer of abdominal wall   . Depression   . Diabetes mellitus without complication ([place])   . Hypertension   . Obesity   . Obesity   . Polycystic ovarian syndrome 07/01/2011   Patient report  . 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 Schizophrenia [facility])     Patient Active Problem List   Diagnosis  Date Noted  . Seizures ([place]) 12/11/2016  . Acute lower UTI 12/11/2016  . DM2 (diabetes mellitus, type 2) ([employer]) 12/11/2016  . Uncontrolled diabetes mellitus ([place]) 11/03/2016  . Schizoaffective disorder, bipolar type ([place]) 11/02/2016  . 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 Schizoaffective disorder, mixed type ([place]) 05/30/2014  . Suicidal ideation   . 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 Headache 04/15/2013  . HTN (hypertension) 04/15/2013  . Post traumatic stress disorder 12/07/2011  . CAP (community acquired pneumonia) 08/27/2011  . Chest pain 08/26/2011  . SOB (shortness of breath) 08/26/2011  . Fever 08/26/2011  . Hypokalemia 08/26/2011  . PSVT (paroxysmal supraventricular tachycardia) ([place]) 08/26/2011  . ADHD 09/23/2007  . EPIGASTRIC PAIN 09/23/2007  . Obesity, unspecified 07/30/2007  . DEPRESSION 07/30/2007  . SLEEP DISORDER 07/30/2007  . IMPAIRED FASTING GLUCOSE 07/30/2007  . FATIGUE 11/21/2006  . ABNORMAL FINDINGS, ELEVATED BP W/O HTN 11/21/2006  . METRORRHAGIA 06/13/2006  . DISORDER, MENSTRUAL NEC 06/13/2006  . DIZZINESS 06/13/2006  . POLYCYSTIC OVARIAN DISEASE 04/25/2006  . AMENORRHEA, SECONDARY 04/20/2006  . ACNE, MILD 04/20/2006  . ABDOMINAL PAIN 04/20/2006    Past Surgical History:  Procedure Laterality Date  . CHOLECYSTECTOMY    . HERNIA REPAIR    . Ovarian Cyst Excision    . VARICOSE VEIN SURGERY      OB History    Gravida Para Term Preterm AB Living   0  SAB TAB Ectopic Multiple Live Births                   Home Medications    Prior to Admission medications   Medication Sig Start Date End Date Taking? Authorizing Provider  busPIRone (BUSPAR) 15 MG tablet Take 1 tablet (15 mg total) by mouth 3 (three) times daily. 11/04/16  Yes Derrill Center, NP  chlorproMAZINE (THORAZINE) 50 MG tablet Take 1 tablet (50 mg total) by mouth 3 (three) times daily. 12/12/16  Yes Aline August, MD  cloNIDine (CATAPRES) 0.1 MG tablet Take  1 tablet (0.1 mg total) by mouth 2 (two) times daily. 11/04/16  Yes Derrill Center, NP  cyclobenzaprine (FLEXERIL) 5 MG tablet Take 5 mg by mouth 2 (two) times daily as needed for muscle spasms.   Yes [provider]  dicyclomine (BENTYL) 20 MG tablet Take 20 mg by mouth every 8 (eight) hours as needed for spasms.   Yes [provider]  fenofibrate (TRICOR) 145 MG tablet Take 145 mg daily by mouth.   Yes [provider]  gabapentin (NEURONTIN) 400 MG capsule Take 1 capsule (400 mg total) by mouth 3 (three) times daily. 11/04/16  Yes Derrill Center, NP  ibuprofen (ADVIL,MOTRIN) 800 MG tablet Take 800 mg by mouth every 6 (six) hours as needed for moderate pain.   Yes [provider]  insulin aspart (NOVOLOG) 100 UNIT/ML injection Inject 22 Units into the skin 3 (three) times daily with meals as needed for high blood sugar.    Yes [provider]  insulin detemir (LEVEMIR) 100 UNIT/ML injection Inject 0.68 mLs (68 Units total) into the skin 2 (two) times daily. 12/12/16  Yes Aline August, MD  levETIRAcetam (KEPPRA) 500 MG tablet Take 1 tablet (500 mg total) by mouth 2 (two) times daily. 11/21/16  Yes Sherwood Gambler, MD  lisinopril (PRINIVIL,ZESTRIL) 10 MG tablet Take 10 mg by mouth daily.   Yes [provider]  pantoprazole (PROTONIX) 40 MG tablet Take 40 mg by mouth daily.   Yes [provider]  QUEtiapine (SEROQUEL XR) 200 MG 24 hr tablet Take 500 mg by mouth at bedtime.   Yes [provider]  QUEtiapine (SEROQUEL) 100 MG tablet Take 100 mg by mouth daily.   Yes [provider]  sertraline (ZOLOFT) 100 MG tablet Take 2 tablets (200 mg total) by mouth every morning. 11/05/16  Yes Derrill Center, NP  topiramate (TOPAMAX) 100 MG tablet Take 1 tablet (100 mg total) by mouth 2 (two) times daily. 11/04/16  Yes Derrill Center, NP  traMADol (ULTRAM) 50 MG tablet Take 50 mg by mouth 4 (four) times daily.   Yes [provider]  traZODone (DESYREL) 50 MG tablet Take 1 tablet (50 mg total) by mouth at bedtime as needed for sleep. Patient taking differently: Take 25 mg by mouth 2 (two) times daily.  11/04/16  Yes Derrill Center, NP  metroNIDAZOLE (METROGEL VAGINAL) 0.75 % vaginal gel Place 1 Applicatorful vaginally 2 (two) times daily. 01/17/17   Antonietta Breach, PA-C    Family History Family History  Problem Relation Age of Onset  . Coronary artery disease Maternal Grandmother   . Diabetes type II Maternal Grandmother   . Cancer Maternal Grandmother   . Hypertension Mother   . Hypertension Father     Social History Social History   Tobacco Use  . Smoking status: Never Smoker  . Smokeless tobacco: Never Used  Substance Use  Topics  . Alcohol use: No  . Drug use: No     Allergies   Fish-derived products; Geodon [ziprasidone hcl]; Haldol [haloperidol lactate]; Buprenorphine hcl; Compazine [prochlorperazine]; Morphine and related; and Toradol [ketorolac tromethamine]   Review of Systems Review of Systems  Gastrointestinal: Positive for abdominal pain.  Ten systems reviewed and are negative for acute change, except as noted in the HPI.     Physical Exam Updated Vital Signs BP 133/86 (BP Location: Right Arm)   Pulse 94   Temp 98.6 F (37 C) (Oral)   Resp 17   Ht 5\' 4"  (1.626 m)   Wt 106.1 kg (234 lb)   LMP 12/21/2016   SpO2 98%   BMI 40.17 kg/m   Physical Exam  Constitutional: She is oriented to person, place, and time. She appears well-developed and well-nourished. No distress.  Nontoxic appearing and in no acute distress.  Seems to be resting comfortably.  HENT:  Head: Normocephalic and atraumatic.  Eyes: Conjunctivae and EOM are normal. No scleral icterus.  Neck: Normal range of motion.  Cardiovascular: Normal rate, regular rhythm and intact distal pulses.  Pulmonary/Chest: Effort normal. No stridor. No respiratory distress. She has no wheezes.  Respirations even and  unlabored.  Abdominal: Soft. She exhibits no mass. There is tenderness.  Soft, obese abdomen with focal tenderness in the right lower quadrant.  No peritoneal signs.  Genitourinary: There is no rash, tenderness, lesion or injury on the right labia. There is no rash, tenderness, lesion or injury on the left labia. Cervix exhibits no motion tenderness and no friability. Vaginal discharge (thin, pale yellow) found.  Genitourinary Comments: Difficult to assess adnexal TTP and mass 2/2 habitus. There is TTP in the RLQ on manual exam. No CMT.  Musculoskeletal: Normal range of motion.  Neurological: She is alert and oriented to person, place, and time. She exhibits normal muscle tone. Coordination normal.  Skin: Skin is warm and dry. No rash noted. She is not diaphoretic. No erythema. No pallor.  Psychiatric: She has a normal mood and affect. Her behavior is normal.  Nursing note and vitals reviewed.    ED Treatments / Results  Labs (all labs ordered are listed, but only abnormal results are displayed) Labs Reviewed  WET PREP, GENITAL - Abnormal; Notable for the following components:      Result Value   Clue Cells Wet Prep HPF POC PRESENT (*)    WBC, Wet Prep HPF POC MANY (*)    All other components within normal limits  COMPREHENSIVE METABOLIC PANEL - Abnormal; Notable for the following components:   Sodium 134 (*)    CO2 19 (*)    Glucose, Bld 323 (*)    All other components within normal limits  CBC - Abnormal; Notable for the following components:   Hemoglobin 11.6 (*)    HCT 35.7 (*)    MCH 25.7 (*)    RDW 15.8 (*)    Platelets 127 (*)    All other components within normal limits  URINALYSIS, ROUTINE W REFLEX MICROSCOPIC - Abnormal; Notable for the following components:   APPearance CLOUDY (*)    Glucose, UA >=500 (*)    Leukocytes, UA MODERATE (*)    Bacteria, UA RARE (*)    Squamous Epithelial / LPF 6-30 (*)    All other components within normal limits  LIPASE, BLOOD  I-STAT  BETA HCG BLOOD, ED ([facility], [facility], [facility] ONLY)  GC/CHLAMYDIA PROBE AMP () NOT AT [facility]  EKG  EKG Interpretation None       Radiology No results found.  Procedures Procedures (including critical care time)  Medications Ordered in ED Medications  dicyclomine (BENTYL) injection 20 mg (20 mg Intramuscular Given 01/17/17 0111)  ondansetron (ZOFRAN-ODT) disintegrating tablet 8 mg (8 mg Oral Given 01/17/17 0111)  fentaNYL (SUBLIMAZE) injection 50 mcg (50 mcg Intramuscular Given 01/17/17 0238)     Initial Impression / Assessment and Plan / ED Course  I have reviewed the triage vital signs and the nursing notes.  Pertinent labs & imaging results that were available during my care of the patient were reviewed by me and considered in my medical decision making (see chart for details).     1:15 AM Patient symptoms not consistent with appendicitis.  Sudden onset of pain with lack of fever or leukocytosis is very atypical for this type of presentation.  Given chronicity of right lower quadrant pain complaints with reassuring CT scans and laboratory workup at baseline, I see low yield in repeating this scan today.  On chart review, it does not appear as though the patient has had a pelvic exam while in the emergency department.  Will complete this today.  I also have a low suspicion for ovarian torsion given that pain has been present for almost 24 hours with no signs of peritonitis on exam or significant discomfort while the patient is at rest.  I have had a conversation with the patient about the nonemergent nature of an ovarian cyst, should be causing her pain, as well as supportive management for ovarian cyst rupture.  Patient verbalizes understanding.  She is open to proceeding with pelvic exam.  4:45 AM Went to discuss results of wet prep and plan for discharge.  Patient was no longer found to be in the room.  RN tech saw the patient walking out of the department.  Disposition set to  eloped.   Final Clinical Impressions(s) / ED Diagnoses   Final diagnoses:  Right lower quadrant abdominal pain  Bacterial vaginosis  Hyperglycemia    ED Discharge Orders        Ordered    metroNIDAZOLE (METROGEL VAGINAL) 0.75 % vaginal gel  2 times daily     01/17/17 0438    Patient dis not leave with this Rx as she eloped prior to formal discharge.   Antonietta Breach, PA-C 26/94/85 [id number]    Delora Fuel, MD 03/50/09 [phone]

## 2017-02-01 ENCOUNTER — Other Ambulatory Visit: Payer: Self-pay

## 2017-02-01 ENCOUNTER — Encounter (HOSPITAL_COMMUNITY): Payer: Self-pay | Admitting: Emergency Medicine

## 2017-02-03 ENCOUNTER — Encounter (HOSPITAL_COMMUNITY): Payer: Self-pay | Admitting: Emergency Medicine

## 2017-02-03 ENCOUNTER — Emergency Department (HOSPITAL_COMMUNITY)
Admission: EM | Admit: 2017-02-03 | Discharge: 2017-02-03 | Disposition: A | Discharge status: Home / Self Care | Payer: Medicare HMO | Attending: Emergency Medicine | Admitting: Emergency Medicine

## 2017-02-03 ENCOUNTER — Emergency Department (HOSPITAL_COMMUNITY): Payer: Medicare HMO

## 2017-02-03 DIAGNOSIS — Z79899 Other long term (current) drug therapy: Secondary | ICD-10-CM | POA: Diagnosis not present

## 2017-02-03 DIAGNOSIS — R112 Nausea with vomiting, unspecified: Secondary | ICD-10-CM | POA: Insufficient documentation

## 2017-02-03 DIAGNOSIS — Z794 Long term (current) use of insulin: Secondary | ICD-10-CM | POA: Diagnosis not present

## 2017-02-03 DIAGNOSIS — I10 Essential (primary) hypertension: Secondary | ICD-10-CM | POA: Insufficient documentation

## 2017-02-03 DIAGNOSIS — E119 Type 2 diabetes mellitus without complications: Secondary | ICD-10-CM | POA: Insufficient documentation

## 2017-02-03 DIAGNOSIS — R109 Unspecified abdominal pain: Secondary | ICD-10-CM

## 2017-02-03 DIAGNOSIS — K2970 Gastritis, unspecified, without bleeding: Secondary | ICD-10-CM | POA: Diagnosis not present

## 2017-02-03 DIAGNOSIS — N3000 Acute cystitis without hematuria: Secondary | ICD-10-CM | POA: Insufficient documentation

## 2017-02-03 DIAGNOSIS — R1032 Left lower quadrant pain: Secondary | ICD-10-CM | POA: Diagnosis present

## 2017-02-03 LAB — URINALYSIS, ROUTINE W REFLEX MICROSCOPIC
BILIRUBIN URINE: NEGATIVE
KETONES UR: NEGATIVE mg/dL
NITRITE: NEGATIVE
PH: 6 (ref 5.0–8.0)
Protein, ur: NEGATIVE mg/dL
Specific Gravity, Urine: 1.04 — ABNORMAL HIGH (ref 1.005–1.030)

## 2017-02-03 LAB — CBC
HEMATOCRIT: 32.7 % — AB (ref 36.0–46.0)
Hemoglobin: 10.5 g/dL — ABNORMAL LOW (ref 12.0–15.0)
MCH: 25.6 pg — ABNORMAL LOW (ref 26.0–34.0)
MCHC: 32.1 g/dL (ref 30.0–36.0)
MCV: 79.8 fL (ref 78.0–100.0)
Platelets: 122 10*3/uL — ABNORMAL LOW (ref 150–400)
RBC: 4.1 MIL/uL (ref 3.87–5.11)
RDW: 16 % — AB (ref 11.5–15.5)
WBC: 4.4 10*3/uL (ref 4.0–10.5)

## 2017-02-03 LAB — BASIC METABOLIC PANEL
ANION GAP: 10 (ref 5–15)
BUN: 7 mg/dL (ref 6–20)
CO2: 19 mmol/L — ABNORMAL LOW (ref 22–32)
Calcium: 8.8 mg/dL — ABNORMAL LOW (ref 8.9–10.3)
Chloride: 106 mmol/L (ref 101–111)
Creatinine, Ser: 0.41 mg/dL — ABNORMAL LOW (ref 0.44–1.00)
Glucose, Bld: 388 mg/dL — ABNORMAL HIGH (ref 65–99)
POTASSIUM: 3.6 mmol/L (ref 3.5–5.1)
SODIUM: 135 mmol/L (ref 135–145)

## 2017-02-03 LAB — CBG MONITORING, ED: GLUCOSE-CAPILLARY: 382 mg/dL — AB (ref 65–99)

## 2017-02-03 LAB — LIPASE, BLOOD: Lipase: 33 U/L (ref 11–51)

## 2017-02-03 LAB — I-STAT BETA HCG BLOOD, ED (MC, WL, AP ONLY): I-stat hCG, quantitative: <5 m[IU]/mL (ref <5)

## 2017-02-03 MED ORDER — MORPHINE SULFATE (PF) 4 MG/ML IV SOLN
4.0000 mg | Freq: Once | INTRAVENOUS | Status: AC
  Administered 2017-02-03: 4 mg via INTRAVENOUS
  Filled 2017-02-03: qty 1

## 2017-02-03 MED ORDER — SODIUM CHLORIDE 0.9 % IV BOLUS (SEPSIS)
1000.0000 mL | Freq: Once | INTRAVENOUS | Status: AC
  Administered 2017-02-03: 1000 mL via INTRAVENOUS

## 2017-02-03 MED ORDER — ONDANSETRON 8 MG PO TBDP: 8.0000 mg | ORAL_TABLET | Freq: Three times a day (TID) | ORAL | 0 refills | Status: DC | PRN

## 2017-02-03 MED ORDER — IOPAMIDOL (ISOVUE-300) INJECTION 61%
INTRAVENOUS | Status: AC
  Filled 2017-02-03: qty 100

## 2017-02-03 MED ORDER — CEPHALEXIN 500 MG PO CAPS
500.0000 mg | ORAL_CAPSULE | Freq: Once | ORAL | Status: AC
  Administered 2017-02-03: 500 mg via ORAL
  Filled 2017-02-03: qty 1

## 2017-02-03 MED ORDER — DIPHENHYDRAMINE HCL 50 MG/ML IJ SOLN
12.5000 mg | Freq: Once | INTRAMUSCULAR | Status: AC
Start: 2017-02-03 — End: 2017-02-03
  Administered 2017-02-03: 12.5 mg via INTRAVENOUS
  Filled 2017-02-03: qty 1

## 2017-02-03 MED ORDER — ONDANSETRON HCL 4 MG/2ML IJ SOLN
4.0000 mg | Freq: Once | INTRAMUSCULAR | Status: AC
  Administered 2017-02-03: 4 mg via INTRAVENOUS
  Filled 2017-02-03: qty 2

## 2017-02-03 MED ORDER — CEPHALEXIN 500 MG PO CAPS: 500.0000 mg | ORAL_CAPSULE | Freq: Three times a day (TID) | ORAL | 0 refills | Status: DC

## 2017-02-03 MED ORDER — IOPAMIDOL (ISOVUE-300) INJECTION 61%
100.0000 mL | Freq: Once | INTRAVENOUS | Status: AC | PRN
  Administered 2017-02-03: 100 mL via INTRAVENOUS

## 2017-02-03 NOTE — ED Provider Notes (Signed)
[facility] DEPT Provider Note   CSN: [id number] Arrival date & time: 02/03/17  0300     History   Chief Complaint Chief Complaint  Patient presents with  . Abdominal Pain  . Hyperglycemia    HPI Tina Saunders is a 32 y.o. female.  HPI Patient is a 32 year old female presents the emergency department with left-sided abdominal pain as well as a nausea vomiting.  She notes that her blood sugars were somewhat more elevated over the past 24-48 hours.  She also reports diarrhea without blood.  Symptoms are moderate in severity.  Pain is worse with palpation of her left abdomen.  No flank pain.  No chest pain or shortness of breath.  Denies productive cough.  No documented fevers.   Past Medical History:  Diagnosis Date  . Anxiety   . Bipolar 1 disorder ([place])   . Cancer of abdominal wall   . Depression   . Diabetes mellitus without complication ([place])   . Hypertension   . Obesity   . Obesity   . Polycystic ovarian syndrome 07/01/2011   Patient report  . 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 Schizophrenia [facility])     Patient Active Problem List   Diagnosis Date Noted  . Seizures ([place]) 12/11/2016  . Acute lower UTI 12/11/2016  . DM2 (diabetes mellitus, type 2) ([place]) 12/11/2016  . Uncontrolled diabetes mellitus ([place]) 11/03/2016  . Schizoaffective disorder, bipolar type ([place]) 11/02/2016  . 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 Schizoaffective disorder, mixed type ([place]) 05/30/2014  . Suicidal ideation   . 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 Headache 04/15/2013  . HTN (hypertension) 04/15/2013  . Post traumatic stress disorder 12/07/2011  . CAP (community acquired pneumonia) 08/27/2011  . Chest pain 08/26/2011  . SOB (shortness of breath) 08/26/2011  . Fever 08/26/2011  . Hypokalemia 08/26/2011  . PSVT (paroxysmal supraventricular tachycardia) ([place]) 08/26/2011  . ADHD 09/23/2007  . EPIGASTRIC PAIN 09/23/2007  . Obesity, unspecified  07/30/2007  . DEPRESSION 07/30/2007  . SLEEP DISORDER 07/30/2007  . IMPAIRED FASTING GLUCOSE 07/30/2007  . FATIGUE 11/21/2006  . ABNORMAL FINDINGS, ELEVATED BP W/O HTN 11/21/2006  . METRORRHAGIA 06/13/2006  . DISORDER, MENSTRUAL NEC 06/13/2006  . DIZZINESS 06/13/2006  . POLYCYSTIC OVARIAN DISEASE 04/25/2006  . AMENORRHEA, SECONDARY 04/20/2006  . ACNE, MILD 04/20/2006  . ABDOMINAL PAIN 04/20/2006    Past Surgical History:  Procedure Laterality Date  . CHOLECYSTECTOMY    . HERNIA REPAIR    . Ovarian Cyst Excision    . VARICOSE VEIN SURGERY      OB History    Gravida Para Term Preterm AB Living   0             SAB TAB Ectopic Multiple Live Births                   Home Medications    Prior to Admission medications   Medication Sig Start Date End Date Taking? Authorizing Provider  busPIRone (BUSPAR) 15 MG tablet Take 1 tablet (15 mg total) by mouth 3 (three) times daily. 11/04/16   Derrill Center, NP  cephALEXin (KEFLEX) 500 MG capsule Take 1 capsule (500 mg total) by mouth 3 (three) times daily. 02/03/17   Jola Schmidt, MD  chlorproMAZINE (THORAZINE) 50 MG tablet Take 1 tablet (50 mg total) by mouth 3 (three) times daily. 12/12/16   Aline August, MD  cloNIDine (CATAPRES) 0.1 MG tablet Take 1 tablet (0.1 mg total)  by mouth 2 (two) times daily. 11/04/16   Derrill Center, NP  cyclobenzaprine (FLEXERIL) 5 MG tablet Take 5 mg by mouth 2 (two) times daily.     [provider]  dicyclomine (BENTYL) 20 MG tablet Take 20 mg by mouth every 8 (eight) hours as needed for spasms.    [provider]  Dulaglutide (TRULICITY) 1.5 JE/5.6DJ SOPN Inject 1.5 mg into the skin every Friday.    [provider]  fenofibrate (TRICOR) 145 MG tablet Take 145 mg daily by mouth.    [provider]  gabapentin (NEURONTIN) 400 MG capsule Take 1 capsule (400 mg total) by mouth 3 (three) times daily. 11/04/16   Derrill Center, NP  ibuprofen (ADVIL,MOTRIN) 800 MG  tablet Take 800 mg by mouth every 8 (eight) hours as needed for moderate pain.     [provider]  insulin aspart (NOVOLOG) 100 UNIT/ML injection Inject 22 Units into the skin 3 (three) times daily with meals as needed for high blood sugar.     [provider]  insulin detemir (LEVEMIR) 100 UNIT/ML injection Inject 0.68 mLs (68 Units total) into the skin 2 (two) times daily. 12/12/16   Aline August, MD  levETIRAcetam (KEPPRA) 500 MG tablet Take 1 tablet (500 mg total) by mouth 2 (two) times daily. 11/21/16   Sherwood Gambler, MD  lisinopril (PRINIVIL,ZESTRIL) 10 MG tablet Take 10 mg by mouth daily.     [provider]  metroNIDAZOLE (METROGEL VAGINAL) 0.75 % vaginal gel Place 1 Applicatorful vaginally 2 (two) times daily. Patient not taking: Reported on 01/31/2017 01/17/17   Antonietta Breach, PA-C  ondansetron (ZOFRAN ODT) 8 MG disintegrating tablet Take 1 tablet (8 mg total) by mouth every 8 (eight) hours as needed for nausea or vomiting. 02/03/17   Jola Schmidt, MD  pantoprazole (PROTONIX) 40 MG tablet Take 40 mg by mouth daily.    [provider]  QUEtiapine (SEROQUEL XR) 200 MG 24 hr tablet Take 500 mg by mouth at bedtime.    [provider]  QUEtiapine (SEROQUEL) 100 MG tablet Take 100 mg by mouth daily.    [provider]  sertraline (ZOLOFT) 100 MG tablet Take 2 tablets (200 mg total) by mouth every morning. 11/05/16   Derrill Center, NP  topiramate (TOPAMAX) 100 MG tablet Take 1 tablet (100 mg total) by mouth 2 (two) times daily. 11/04/16   Derrill Center, NP  traMADol (ULTRAM) 50 MG tablet Take 50 mg by mouth 2 (two) times daily as needed for moderate pain.     [provider]  traZODone (DESYREL) 50 MG tablet Take 1 tablet (50 mg total) by mouth at bedtime as needed for sleep. Patient taking differently: Take 25 mg by mouth 2 (two) times daily.  11/04/16   Derrill Center, NP    Family History Family History  Problem Relation  Age of Onset  . Coronary artery disease Maternal Grandmother   . Diabetes type II Maternal Grandmother   . Cancer Maternal Grandmother   . Hypertension Mother   . Hypertension Father     Social History Social History   Tobacco Use  . Smoking status: Never Smoker  . Smokeless tobacco: Never Used  Substance Use Topics  . Alcohol use: No  . Drug use: No     Allergies   Fish-derived products; Geodon [ziprasidone hcl]; Haldol [haloperidol lactate]; Buprenorphine hcl; Compazine [prochlorperazine]; Morphine and related; and Toradol [ketorolac tromethamine]   Review of Systems Review  of Systems  All other systems reviewed and are negative.    Physical Exam Updated Vital Signs BP 135/78   Pulse (!) 101   Temp 98 F (36.7 C) (Oral)   Resp 18   LMP 02/01/2017 Comment: HCG < 5 02-03-17  SpO2 98%   Physical Exam  Constitutional: She is oriented to person, place, and time. She appears well-developed and well-nourished. No distress.  HENT:  Head: Normocephalic and atraumatic.  Eyes: EOM are normal.  Neck: Normal range of motion.  Cardiovascular: Normal rate, regular rhythm and normal heart sounds.  Pulmonary/Chest: Effort normal and breath sounds normal.  Abdominal: Soft. She exhibits no distension. There is no tenderness.  Mild left-sided abdominal tenderness without guarding rebound  Musculoskeletal: Normal range of motion.  Neurological: She is alert and oriented to person, place, and time.  Skin: Skin is warm and dry.  Psychiatric: She has a normal mood and affect. Judgment normal.  Nursing note and vitals reviewed.    ED Treatments / Results  Labs (all labs ordered are listed, but only abnormal results are displayed) Labs Reviewed  BASIC METABOLIC PANEL - Abnormal; Notable for the following components:      Result Value   CO2 19 (*)    Glucose, Bld 388 (*)    Creatinine, Ser 0.41 (*)    Calcium 8.8 (*)    All other components within normal limits  CBC -  Abnormal; Notable for the following components:   Hemoglobin 10.5 (*)    HCT 32.7 (*)    MCH 25.6 (*)    RDW 16.0 (*)    Platelets 122 (*)    All other components within normal limits  URINALYSIS, ROUTINE W REFLEX MICROSCOPIC - Abnormal; Notable for the following components:   APPearance HAZY (*)    Specific Gravity, Urine 1.040 (*)    Glucose, UA >=500 (*)    Hgb urine dipstick LARGE (*)    Leukocytes, UA TRACE (*)    Bacteria, UA FEW (*)    Squamous Epithelial / LPF 0-5 (*)    All other components within normal limits  CBG MONITORING, ED - Abnormal; Notable for the following components:   Glucose-Capillary 382 (*)    All other components within normal limits  URINE CULTURE  LIPASE, BLOOD  CBG MONITORING, ED  I-STAT BETA HCG BLOOD, ED ([facility], [facility], [facility] ONLY)    EKG  EKG Interpretation None       Radiology Ct Abdomen Pelvis W Contrast  Result Date: 02/03/2017 CLINICAL DATA:  Abdominal pain EXAM: CT ABDOMEN AND PELVIS WITH CONTRAST TECHNIQUE: Multidetector CT imaging of the abdomen and pelvis was performed using the standard protocol following bolus administration of intravenous contrast. CONTRAST:  132mL ISOVUE-300 IOPAMIDOL (ISOVUE-300) INJECTION 61% COMPARISON:  10/26/2016 FINDINGS: Lower chest: Heart is borderline in size.  Lung bases clear. Hepatobiliary: Diffuse low-density throughout the liver compatible with fatty infiltration. More severe low-density noted anteriorly in both the right and left hepatic lobes suggesting more severe focal fatty infiltration. No focal abnormality. Prior cholecystectomy Pancreas: No focal abnormality or ductal dilatation. Spleen: Splenomegaly. Craniocaudal length of the spleen measures 21 cm. Adrenals/Urinary Tract: No adrenal abnormality. No focal renal abnormality. No stones or hydronephrosis. Urinary bladder is unremarkable. Stomach/Bowel: Stomach, large and small bowel grossly unremarkable. Vascular/Lymphatic: No evidence of aneurysm or  adenopathy. Reproductive: Uterus and adnexa unremarkable.  No mass. Other: No free fluid or free air. Musculoskeletal: No acute findings. IMPRESSION: Severe diffuse fatty infiltration of the liver with more  severe focal fatty infiltration anteriorly in both the right and left hepatic lobes. Associated marked splenomegaly. Electronically Signed   By: Rolm Baptise M.D.   On: 02/03/2017 09:02    Procedures Procedures (including critical care time)  Medications Ordered in ED Medications  iopamidol (ISOVUE-300) 61 % injection (not administered)  cephALEXin (KEFLEX) capsule 500 mg (not administered)  morphine 4 MG/ML injection 4 mg (4 mg Intravenous Given 02/03/17 0744)  diphenhydrAMINE (BENADRYL) injection 12.5 mg (12.5 mg Intravenous Given 02/03/17 0743)  ondansetron (ZOFRAN) injection 4 mg (4 mg Intravenous Given 02/03/17 0746)  sodium chloride 0.9 % bolus 1,000 mL (1,000 mLs Intravenous New Bag/Given 02/03/17 0809)  iopamidol (ISOVUE-300) 61 % injection 100 mL (100 mLs Intravenous Contrast Given 02/03/17 0849)     Initial Impression / Assessment and Plan / ED Course  I have reviewed the triage vital signs and the nursing notes.  Pertinent labs & imaging results that were available during my care of the patient were reviewed by me and considered in my medical decision making (see chart for details).     Overall well-appearing.  Symptoms improved in the emergency department.  CT scan of the abdomen without acute process.  Urine concerning for possible UTI.  May represent left-sided early pyelonephritis.  Antibiotics now.  Urine culture sent.  Home with antibiotics and nausea medication.  Recommend over-the-counter medications for discomfort.  Patient understands return to the emergency department for new or worsening symptoms  Final Clinical Impressions(s) / ED Diagnoses   Final diagnoses:  Acute abdominal pain  Acute cystitis without hematuria    ED Discharge Orders        Ordered     cephALEXin (KEFLEX) 500 MG capsule  3 times daily     02/03/17 1014    ondansetron (ZOFRAN ODT) 8 MG disintegrating tablet  Every 8 hours PRN     02/03/17 1014       Jola Schmidt, MD 02/03/17 1038

## 2017-02-03 NOTE — ED Triage Notes (Signed)
Patient here from home via EMS with complaints of left lower abd pain with nausea/vomiting. Reports taking 22 units of insulin at 1 am . Patient walked in triage drinking a grape crush soda.

## 2017-02-05 ENCOUNTER — Emergency Department (HOSPITAL_COMMUNITY)
Admission: EM | Admit: 2017-02-05 | Discharge: 2017-02-05 | Disposition: A | Discharge status: Home / Self Care | Payer: Medicare HMO | Attending: Emergency Medicine | Admitting: Emergency Medicine

## 2017-02-05 ENCOUNTER — Other Ambulatory Visit: Payer: Self-pay

## 2017-02-05 ENCOUNTER — Ambulatory Visit: Payer: Self-pay | Admitting: Obstetrics and Gynecology

## 2017-02-05 ENCOUNTER — Encounter (HOSPITAL_COMMUNITY): Payer: Self-pay

## 2017-02-05 DIAGNOSIS — E119 Type 2 diabetes mellitus without complications: Secondary | ICD-10-CM | POA: Insufficient documentation

## 2017-02-05 DIAGNOSIS — F250 Schizoaffective disorder, bipolar type: Secondary | ICD-10-CM | POA: Diagnosis present

## 2017-02-05 DIAGNOSIS — I10 Essential (primary) hypertension: Secondary | ICD-10-CM | POA: Diagnosis not present

## 2017-02-05 DIAGNOSIS — Z79899 Other long term (current) drug therapy: Secondary | ICD-10-CM | POA: Insufficient documentation

## 2017-02-05 DIAGNOSIS — Z794 Long term (current) use of insulin: Secondary | ICD-10-CM | POA: Diagnosis not present

## 2017-02-05 DIAGNOSIS — F259 Schizoaffective disorder, unspecified: Secondary | ICD-10-CM | POA: Insufficient documentation

## 2017-02-05 DIAGNOSIS — F23 Brief psychotic disorder: Secondary | ICD-10-CM | POA: Diagnosis not present

## 2017-02-05 DIAGNOSIS — R440 Auditory hallucinations: Secondary | ICD-10-CM | POA: Diagnosis not present

## 2017-02-05 DIAGNOSIS — R000 Tachycardia, unspecified: Secondary | ICD-10-CM | POA: Diagnosis not present

## 2017-02-05 LAB — URINALYSIS, ROUTINE W REFLEX MICROSCOPIC
Bacteria, UA: NONE SEEN
Bilirubin Urine: NEGATIVE
HGB URINE DIPSTICK: NEGATIVE
KETONES UR: NEGATIVE mg/dL
Nitrite: NEGATIVE
PROTEIN: NEGATIVE mg/dL
Specific Gravity, Urine: 1.031 — ABNORMAL HIGH (ref 1.005–1.030)
pH: 7 (ref 5.0–8.0)

## 2017-02-05 LAB — COMPREHENSIVE METABOLIC PANEL
ALBUMIN: 3.9 g/dL (ref 3.5–5.0)
ALK PHOS: 113 U/L (ref 38–126)
ALT: 28 U/L (ref 14–54)
AST: 44 U/L — AB (ref 15–41)
Anion gap: 10 (ref 5–15)
BILIRUBIN TOTAL: 0.2 mg/dL — AB (ref 0.3–1.2)
BUN: 7 mg/dL (ref 6–20)
CALCIUM: 8.7 mg/dL — AB (ref 8.9–10.3)
CO2: 21 mmol/L — AB (ref 22–32)
Chloride: 103 mmol/L (ref 101–111)
Creatinine, Ser: 0.5 mg/dL (ref 0.44–1.00)
GFR calc Af Amer: >60 mL/min (ref >60)
GFR calc non Af Amer: >60 mL/min (ref >60)
Glucose, Bld: 420 mg/dL — ABNORMAL HIGH (ref 65–99)
Potassium: 3.5 mmol/L (ref 3.5–5.1)
SODIUM: 134 mmol/L — AB (ref 135–145)
TOTAL PROTEIN: 7.5 g/dL (ref 6.5–8.1)

## 2017-02-05 LAB — CBC
HEMATOCRIT: 34.4 % — AB (ref 36.0–46.0)
HEMOGLOBIN: 10.9 g/dL — AB (ref 12.0–15.0)
MCH: 25.4 pg — ABNORMAL LOW (ref 26.0–34.0)
MCHC: 31.7 g/dL (ref 30.0–36.0)
MCV: 80.2 fL (ref 78.0–100.0)
Platelets: 138 10*3/uL — ABNORMAL LOW (ref 150–400)
RBC: 4.29 MIL/uL (ref 3.87–5.11)
RDW: 16.1 % — ABNORMAL HIGH (ref 11.5–15.5)
WBC: 4.9 10*3/uL (ref 4.0–10.5)

## 2017-02-05 LAB — CBG MONITORING, ED
GLUCOSE-CAPILLARY: 318 mg/dL — AB (ref 65–99)
GLUCOSE-CAPILLARY: 212 mg/dL — AB (ref 65–99)
Glucose-Capillary: 386 mg/dL — ABNORMAL HIGH (ref 65–99)
Glucose-Capillary: 368 mg/dL — ABNORMAL HIGH (ref 65–99)

## 2017-02-05 LAB — I-STAT BETA HCG BLOOD, ED (MC, WL, AP ONLY)

## 2017-02-05 LAB — URINE CULTURE

## 2017-02-05 LAB — LIPASE, BLOOD: Lipase: 30 U/L (ref 11–51)

## 2017-02-05 MED ORDER — SODIUM CHLORIDE 0.9 % IV BOLUS (SEPSIS)
1000.0000 mL | Freq: Once | INTRAVENOUS | Status: AC
  Administered 2017-02-05: 1000 mL via INTRAVENOUS

## 2017-02-05 MED ORDER — INSULIN DETEMIR 100 UNIT/ML ~~LOC~~ SOLN
68.0000 [IU] | Freq: Two times a day (BID) | SUBCUTANEOUS | Status: DC
Start: 2017-02-05 — End: 2017-02-05
  Administered 2017-02-05: 68 [IU] via SUBCUTANEOUS
  Filled 2017-02-05: qty 0.68

## 2017-02-05 MED ORDER — TRAMADOL HCL 50 MG PO TABS: 50.0000 mg | ORAL_TABLET | Freq: Two times a day (BID) | ORAL | Status: DC | PRN

## 2017-02-05 MED ORDER — CLONIDINE HCL 0.1 MG PO TABS
0.1000 mg | ORAL_TABLET | Freq: Two times a day (BID) | ORAL | Status: DC
  Administered 2017-02-05: 0.1 mg via ORAL
  Filled 2017-02-05: qty 1

## 2017-02-05 MED ORDER — LORAZEPAM 2 MG/ML IJ SOLN
1.0000 mg | Freq: Once | INTRAMUSCULAR | Status: AC
  Administered 2017-02-05: 1 mg via INTRAVENOUS
  Filled 2017-02-05: qty 1

## 2017-02-05 MED ORDER — INSULIN ASPART 100 UNIT/ML ~~LOC~~ SOLN
22.0000 [IU] | Freq: Three times a day (TID) | SUBCUTANEOUS | Status: DC | PRN
  Administered 2017-02-05: 22 [IU] via SUBCUTANEOUS
  Filled 2017-02-05: qty 1

## 2017-02-05 MED ORDER — CYCLOBENZAPRINE HCL 10 MG PO TABS
5.0000 mg | ORAL_TABLET | Freq: Two times a day (BID) | ORAL | Status: DC
  Administered 2017-02-05: 5 mg via ORAL
  Filled 2017-02-05: qty 1

## 2017-02-05 MED ORDER — TOPIRAMATE 100 MG PO TABS
100.0000 mg | ORAL_TABLET | Freq: Two times a day (BID) | ORAL | Status: DC
  Administered 2017-02-05: 100 mg via ORAL
  Filled 2017-02-05: qty 1

## 2017-02-05 MED ORDER — GABAPENTIN 400 MG PO CAPS
400.0000 mg | ORAL_CAPSULE | Freq: Three times a day (TID) | ORAL | Status: DC
  Administered 2017-02-05: 400 mg via ORAL
  Filled 2017-02-05: qty 1

## 2017-02-05 MED ORDER — LISINOPRIL 10 MG PO TABS
10.0000 mg | ORAL_TABLET | Freq: Every day | ORAL | Status: DC
  Administered 2017-02-05: 10 mg via ORAL
  Filled 2017-02-05: qty 1

## 2017-02-05 MED ORDER — LEVETIRACETAM 500 MG PO TABS
500.0000 mg | ORAL_TABLET | Freq: Two times a day (BID) | ORAL | Status: DC
  Administered 2017-02-05: 500 mg via ORAL
  Filled 2017-02-05 (×2): qty 1

## 2017-02-05 MED ORDER — DULAGLUTIDE 1.5 MG/0.5ML ~~LOC~~ SOAJ: 1.5000 mg | SUBCUTANEOUS | Status: DC

## 2017-02-05 MED ORDER — ONDANSETRON 8 MG PO TBDP: 8.0000 mg | ORAL_TABLET | Freq: Three times a day (TID) | ORAL | Status: DC | PRN

## 2017-02-05 MED ORDER — LORAZEPAM 2 MG/ML IJ SOLN
2.0000 mg | Freq: Once | INTRAMUSCULAR | Status: AC
  Administered 2017-02-05: 2 mg via INTRAMUSCULAR
  Filled 2017-02-05: qty 1

## 2017-02-05 MED ORDER — FENOFIBRATE 160 MG PO TABS
160.0000 mg | ORAL_TABLET | Freq: Every day | ORAL | Status: DC
  Administered 2017-02-05: 160 mg via ORAL
  Filled 2017-02-05: qty 1

## 2017-02-05 MED ORDER — DIPHENHYDRAMINE HCL 50 MG/ML IJ SOLN
50.0000 mg | Freq: Once | INTRAMUSCULAR | Status: AC
  Administered 2017-02-05: 50 mg via INTRAMUSCULAR
  Filled 2017-02-05: qty 1

## 2017-02-05 MED ORDER — PANTOPRAZOLE SODIUM 40 MG PO TBEC
40.0000 mg | DELAYED_RELEASE_TABLET | Freq: Every day | ORAL | Status: DC
  Administered 2017-02-05: 40 mg via ORAL
  Filled 2017-02-05: qty 1

## 2017-02-05 MED ORDER — DICYCLOMINE HCL 20 MG PO TABS: 20.0000 mg | ORAL_TABLET | Freq: Three times a day (TID) | ORAL | Status: DC | PRN

## 2017-02-05 NOTE — [facility] Suicide Risk Assessment (Signed)
Suicide Risk Assessment  Discharge Assessment   [facility] Discharge Suicide Risk Assessment   Principal Problem: Schizoaffective disorder, bipolar type [facility]) Discharge Diagnoses:  Patient Active Problem List   Diagnosis Date Noted  . Seizures ([place]) [R56.9] 12/11/2016  . Acute lower UTI [N39.0] 12/11/2016  . DM2 (diabetes mellitus, type 2) ([place]) [E11.9] 12/11/2016  . Uncontrolled diabetes mellitus ([place]) [E11.65] 11/03/2016  . Schizoaffective disorder, bipolar type ([place]) [F25.0] 11/02/2016  . Cluster B personality disorder ([place]) [F60.9] 11/02/2016  . Schizoaffective disorder, mixed type ([place]) [F25.0] 05/30/2014  . Suicidal ideation [R45.851]   . 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 Headache [R51] 04/15/2013  . HTN (hypertension) [I10] 04/15/2013  . Post traumatic stress disorder [F43.10] 12/07/2011  . CAP (community acquired pneumonia) [J18.9] 08/27/2011  . Chest pain [R07.9] 08/26/2011  . SOB (shortness of breath) [R06.02] 08/26/2011  . Fever [R50.9] 08/26/2011  . Hypokalemia [E87.6] 08/26/2011  . PSVT (paroxysmal supraventricular tachycardia) ([place]) [I47.1] 08/26/2011  . ADHD [F90.9] 09/23/2007  . EPIGASTRIC PAIN [R10.13] 09/23/2007  . Obesity, unspecified [E66.9] 07/30/2007  . DEPRESSION [F32.9] 07/30/2007  . SLEEP DISORDER [G47.9] 07/30/2007  . IMPAIRED FASTING GLUCOSE [R73.01] 07/30/2007  . FATIGUE [R53.81, R53.83] 11/21/2006  . ABNORMAL FINDINGS, ELEVATED BP W/O HTN [R03.0] 11/21/2006  . METRORRHAGIA [N92.1] 06/13/2006  . DISORDER, MENSTRUAL NEC [N94.9] 06/13/2006  . DIZZINESS [R42] 06/13/2006  . POLYCYSTIC OVARIAN DISEASE [E28.2] 04/25/2006  . AMENORRHEA, SECONDARY [N91.2] 04/20/2006  . ACNE, MILD [L70.8] 04/20/2006  . ABDOMINAL PAIN [R10.9] 04/20/2006   Pt was seen and chart reviewed with Dr Dwyane Dee. Pt is well known to local emergency rooms for her mental health diagnoses and her multiple somatic complaints. Pt stated she hears voices that tell her to kill herself  but the voices come and go. Pt denies suicidal/homicidal ideation, denies visual hallucinations and does not appear to be responding to internal stimuli. This is a chronic issue with the Pt and she has never acted upon the commands. Pt appears to be at her baseline and is followed by an ACTT team at [facility]. Pt stated she does not feel she needs hospitalization and wants to follow up with her ACTT team. Pt is stable and psychiatrically clear for discharge.    Total Time spent with patient: 30 minutes  Musculoskeletal: Strength & Muscle Tone: within normal limits Gait & Station: normal Patient leans: N/A  Psychiatric Specialty Exam:   Blood pressure (!) 118/57, pulse 93, temperature 99.8 F (37.7 C), temperature source Oral, resp. rate 15, height 5\' 4"  (1.626 m), weight 106.1 kg (234 lb), last menstrual period 02/01/2017, SpO2 97 %.Body mass index is 40.17 kg/m.  General Appearance: Casual  Eye Contact::  Fair  Speech:  Clear and Coherent and Slow409  Volume:  Decreased  Mood:  Depressed  Affect:  Congruent and Depressed  Thought Process:  Coherent and Linear  Orientation:  Full (Time, Place, and Person)  Thought Content:  Logical  Suicidal Thoughts:  No  Homicidal Thoughts:  No  Memory:  Immediate;   Good Recent;   Fair Remote;   Fair  Judgement:  Fair  Insight:  Fair  Psychomotor Activity:  Decreased  Concentration:  Fair  Recall:  [employer] of Knowledge:Fair  Language: Good  Akathisia:  No  Handed:  Right  AIMS (if indicated):     Assets:  Agricultural consultant Housing Social Support  Sleep:     Cognition: WNL  ADL's:  Intact   Mental Status Per Nursing  Assessment::   On Admission:   Pt stated she was having auditory hallucinations  Demographic Factors:  Adolescent or young adult, Caucasian, Low socioeconomic status, Living alone and Unemployed  Loss Factors: Financial problems/change in socioeconomic status  Historical  Factors: Impulsivity  Risk Reduction Factors:   Sense of responsibility to family  Continued Clinical Symptoms:  Bipolar Disorder:   Depressive phase Depression:   Impulsivity More than one psychiatric diagnosis Previous Psychiatric Diagnoses and Treatments Medical Diagnoses and Treatments/Surgeries  Cognitive Features That Contribute To Risk:  Closed-mindedness    Suicide Risk:  Minimal: No identifiable suicidal ideation.  Patients presenting with no risk factors but with morbid ruminations; may be classified as minimal risk based on the severity of the depressive symptoms  Follow-up Information    [facility] Follow up.   Specialty:  Behavioral Health Why:  Please follow up for outpatient resources. Open 7AM-4PM Contact information: [address] [place] [address] [phone]           Plan Of Care/Follow-up recommendations:  Activity:  as tolerated Diet:  Heart Healthy  Ethelene Hal, NP 02/05/2017, 11:15 AM

## 2017-02-05 NOTE — ED Provider Notes (Signed)
Patient is a frequent ED visitor for hyperglycemia and abdominal pain.  She however she presents tonight feeling depressed, and having auditory hallucinations and she has a superficial laceration of her left wrist.  Unfortunately around 6 AM she decided that she was going to leave.  It was explained to patient she could stay voluntarily or I could do commitment papers at which point she would go to a psychiatric facility in handcuffs with the police.  She continued to try to walk away.  IVC papers were filled out by me.  Medical screening examination/treatment/procedure(s) were conducted as a shared visit with non-physician practitioner(s) and myself.  I personally evaluated the patient during the encounter.   EKG Interpretation  Date/Time:  Monday February 05 2017 02:31:11 EST Ventricular Rate:  104 PR Interval:    QRS Duration: 86 QT Interval:  366 QTC Calculation: 482 R Axis:   -7 Text Interpretation:  Sinus tachycardia Inferior infarct, old No significant change since last tracing 21 Nov 2016 Confirmed by Rolland Porter [phone]) on 02/05/2017 2:38:31 AM       Rolland Porter, MD, Barbette Or, MD 02/05/17 [phone]

## 2017-02-05 NOTE — ED Triage Notes (Signed)
Pt arrived from home with complaints of autotory hallucinations, pt has a superficial laceration on wrist that is not currently bleeding. Pt has a hx of schizophrenia. States she has heard these voices before but are increased today

## 2017-02-05 NOTE — ED Provider Notes (Signed)
[facility] DEPT Provider Note   CSN: [id number] Arrival date & time: 02/05/17  0142     History   Chief Complaint Chief Complaint  Patient presents with  . Hyperglycemia  . Hallucinations  . Suicidal    HPI Tina Saunders is a 32 y.o. female with a history of anxiety, bipolar 1 disorder, schizophrenia, DM Type II, HTN, PCOS, and rhabdosarcoma in remission who presents to the emergency department with a chief complaint of suicidal ideation with an associated plan.  She reports the symptoms began tonight prior to arrival.  She used a Audiological scientist tonight to create a self-inflicted wound on her left wrist then called 911.  She reports that she has a plan to overdose on her home medications. She reports ongoing auditory hallucinations that command her to kill herself.  She denies HI or visual hallucinations.  She reports a history of previous suicide attempts and inpatient behavioral health admissions.   She denies fever, chills, chest pain, back pain, dyspnea, nausea, vomiting, diarrhea.  She reports that she has not taken her home nighttime meds prior to arrival.  Level 5 caveat secondary to psychiatric disorder.  The history is provided by the patient. No language interpreter was used.    Past Medical History:  Diagnosis Date  . Anxiety   . Bipolar 1 disorder ([place])   . Cancer of abdominal wall   . Depression   . Diabetes mellitus without complication ([place])   . Hypertension   . Obesity   . Obesity   . Polycystic ovarian syndrome 07/01/2011   Patient report  . 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 Schizophrenia [facility])     Patient Active Problem List   Diagnosis Date Noted  . Seizures ([place]) 12/11/2016  . Acute lower UTI 12/11/2016  . DM2 (diabetes mellitus, type 2) ([place]) 12/11/2016  . Uncontrolled diabetes mellitus ([place]) 11/03/2016  . Schizoaffective disorder, bipolar type ([place]) 11/02/2016  . Cluster B personality disorder ([place]) 11/02/2016  .  Schizoaffective disorder, mixed type ([place]) 05/30/2014  . Suicidal ideation   . 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 Headache 04/15/2013  . HTN (hypertension) 04/15/2013  . Post traumatic stress disorder 12/07/2011  . CAP (community acquired pneumonia) 08/27/2011  . Chest pain 08/26/2011  . SOB (shortness of breath) 08/26/2011  . Fever 08/26/2011  . Hypokalemia 08/26/2011  . PSVT (paroxysmal supraventricular tachycardia) ([place]) 08/26/2011  . ADHD 09/23/2007  . EPIGASTRIC PAIN 09/23/2007  . Obesity, unspecified 07/30/2007  . DEPRESSION 07/30/2007  . SLEEP DISORDER 07/30/2007  . IMPAIRED FASTING GLUCOSE 07/30/2007  . FATIGUE 11/21/2006  . ABNORMAL FINDINGS, ELEVATED BP W/O HTN 11/21/2006  . METRORRHAGIA 06/13/2006  . DISORDER, MENSTRUAL NEC 06/13/2006  . DIZZINESS 06/13/2006  . POLYCYSTIC OVARIAN DISEASE 04/25/2006  . AMENORRHEA, SECONDARY 04/20/2006  . ACNE, MILD 04/20/2006  . ABDOMINAL PAIN 04/20/2006    Past Surgical History:  Procedure Laterality Date  . CHOLECYSTECTOMY    . HERNIA REPAIR    . Ovarian Cyst Excision    . VARICOSE VEIN SURGERY      OB History    Gravida Para Term Preterm AB Living   0             SAB TAB Ectopic Multiple Live Births                   Home Medications    Prior to Admission medications   Medication Sig Start Date End Date Taking? Authorizing Provider  busPIRone (  BUSPAR) 15 MG tablet Take 1 tablet (15 mg total) by mouth 3 (three) times daily. 11/04/16  Yes Derrill Center, NP  cephALEXin (KEFLEX) 500 MG capsule Take 1 capsule (500 mg total) by mouth 3 (three) times daily. 02/03/17  Yes Jola Schmidt, MD  chlorproMAZINE (THORAZINE) 50 MG tablet Take 1 tablet (50 mg total) by mouth 3 (three) times daily. 12/12/16  Yes Aline August, MD  cloNIDine (CATAPRES) 0.1 MG tablet Take 1 tablet (0.1 mg total) by mouth 2 (two) times daily. 11/04/16  Yes Derrill Center, NP  cyclobenzaprine (FLEXERIL) 5 MG tablet Take 5 mg by mouth 2 (two) times  daily.    Yes [provider]  dicyclomine (BENTYL) 20 MG tablet Take 20 mg by mouth every 8 (eight) hours as needed for spasms.   Yes [provider]  Dulaglutide (TRULICITY) 1.5 YS/0.6TK SOPN Inject 1.5 mg into the skin every Friday.   Yes [provider]  fenofibrate (TRICOR) 145 MG tablet Take 145 mg daily by mouth.   Yes [provider]  gabapentin (NEURONTIN) 400 MG capsule Take 1 capsule (400 mg total) by mouth 3 (three) times daily. 11/04/16  Yes Derrill Center, NP  ibuprofen (ADVIL,MOTRIN) 800 MG tablet Take 800 mg by mouth every 8 (eight) hours as needed for moderate pain.    Yes [provider]  insulin aspart (NOVOLOG) 100 UNIT/ML injection Inject 22 Units into the skin 3 (three) times daily with meals as needed for high blood sugar.    Yes [provider]  insulin detemir (LEVEMIR) 100 UNIT/ML injection Inject 0.68 mLs (68 Units total) into the skin 2 (two) times daily. 12/12/16  Yes Aline August, MD  levETIRAcetam (KEPPRA) 500 MG tablet Take 1 tablet (500 mg total) by mouth 2 (two) times daily. 11/21/16  Yes Sherwood Gambler, MD  lisinopril (PRINIVIL,ZESTRIL) 10 MG tablet Take 10 mg by mouth daily.    Yes [provider]  ondansetron (ZOFRAN ODT) 8 MG disintegrating tablet Take 1 tablet (8 mg total) by mouth every 8 (eight) hours as needed for nausea or vomiting. 02/03/17  Yes Jola Schmidt, MD  pantoprazole (PROTONIX) 40 MG tablet Take 40 mg by mouth daily.   Yes [provider]  QUEtiapine (SEROQUEL XR) 200 MG 24 hr tablet Take 500 mg by mouth at bedtime.   Yes [provider]  QUEtiapine (SEROQUEL) 100 MG tablet Take 100 mg by mouth daily.   Yes [provider]  sertraline (ZOLOFT) 100 MG tablet Take 2 tablets (200 mg total) by mouth every morning. 11/05/16  Yes Derrill Center, NP  topiramate (TOPAMAX) 100 MG tablet Take 1 tablet (100 mg total) by mouth 2 (two) times daily. 11/04/16  Yes Derrill Center, NP  traMADol (ULTRAM) 50 MG tablet Take 50 mg by mouth 2 (two) times daily as needed for moderate pain.    Yes [provider]  traZODone (DESYREL) 50 MG tablet Take 1 tablet (50 mg total) by mouth at bedtime as needed for sleep. Patient taking differently: Take 25 mg by mouth 2 (two) times daily.  11/04/16  Yes Derrill Center, NP  metroNIDAZOLE (METROGEL VAGINAL) 0.75 % vaginal gel Place 1 Applicatorful vaginally 2 (two) times daily. Patient not taking: Reported on 01/31/2017 01/17/17   Antonietta Breach, PA-C    Family History Family History  Problem Relation Age of Onset  . Coronary artery disease Maternal Grandmother   . Diabetes type II Maternal Grandmother   .  Cancer Maternal Grandmother   . Hypertension Mother   . Hypertension Father     Social History Social History   Tobacco Use  . Smoking status: Never Smoker  . Smokeless tobacco: Never Used  Substance Use Topics  . Alcohol use: No  . Drug use: No     Allergies   Fish-derived products; Geodon [ziprasidone hcl]; Haldol [haloperidol lactate]; Buprenorphine hcl; Compazine [prochlorperazine]; Morphine and related; and Toradol [ketorolac tromethamine]   Review of Systems Review of Systems  Unable to perform ROS: Psychiatric disorder  Psychiatric/Behavioral: Positive for self-injury and suicidal ideas.     Physical Exam Updated Vital Signs BP (!) 118/57   Pulse 93   Temp 99.8 F (37.7 C) (Oral)   Resp 15   Ht 5\' 4"  (1.626 m)   Wt 106.1 kg (234 lb)   LMP 02/01/2017 Comment: HCG < 5 02-03-17  SpO2 97%   BMI 40.17 kg/m   Physical Exam  Constitutional: No distress.  HENT:  Head: Normocephalic.  Eyes: Conjunctivae are normal.  Neck: Neck supple.  Cardiovascular: Normal rate, regular rhythm, normal heart sounds and intact distal pulses. Exam reveals no gallop and no friction rub.  No murmur heard. Pulmonary/Chest: Effort normal. No stridor. No respiratory distress. She has no wheezes. She has  no rales. She exhibits no tenderness.  Abdominal: Soft. Bowel sounds are normal. She exhibits no distension and no mass. There is tenderness. There is no rebound and no guarding. No hernia.  Obese, protuberant abdomen.  Mild generalized abdominal tenderness without focal TTP.  Musculoskeletal: Normal range of motion. She exhibits no edema, tenderness or deformity.  Symmetric tandem gait.  Neurological: She is alert.  Skin: Skin is warm. Capillary refill takes less than 2 seconds. No rash noted.  Linear hemostatic superficial laceration noted to the left wrist.   Psychiatric: Her mood appears anxious. She is withdrawn and actively hallucinating. Thought content is not paranoid and not delusional. She expresses impulsivity and inappropriate judgment. She exhibits a depressed mood. She expresses suicidal ideation. She expresses no homicidal ideation. She expresses suicidal plans. She expresses no homicidal plans.  Nursing note and vitals reviewed.    ED Treatments / Results  Labs (all labs ordered are listed, but only abnormal results are displayed) Labs Reviewed  CBC - Abnormal; Notable for the following components:      Result Value   Hemoglobin 10.9 (*)    HCT 34.4 (*)    MCH 25.4 (*)    RDW 16.1 (*)    Platelets 138 (*)    All other components within normal limits  COMPREHENSIVE METABOLIC PANEL - Abnormal; Notable for the following components:   Sodium 134 (*)    CO2 21 (*)    Glucose, Bld 420 (*)    Calcium 8.7 (*)    AST 44 (*)    Total Bilirubin 0.2 (*)    All other components within normal limits  URINALYSIS, ROUTINE W REFLEX MICROSCOPIC - Abnormal; Notable for the following components:   APPearance HAZY (*)    Specific Gravity, Urine 1.031 (*)    Glucose, UA >=500 (*)    Leukocytes, UA SMALL (*)    Squamous Epithelial / LPF 6-30 (*)    All other components within normal limits  CBG MONITORING, ED - Abnormal; Notable for the following components:   Glucose-Capillary 386  (*)    All other components within normal limits  CBG MONITORING, ED - Abnormal; Notable for the following components:   Glucose-Capillary 368 (*)  All other components within normal limits  CBG MONITORING, ED - Abnormal; Notable for the following components:   Glucose-Capillary 318 (*)    All other components within normal limits  LIPASE, BLOOD  I-STAT BETA HCG BLOOD, ED ([facility], [facility], [facility] ONLY)    EKG  EKG Interpretation  Date/Time:  Monday February 05 2017 02:31:11 EST Ventricular Rate:  104 PR Interval:    QRS Duration: 86 QT Interval:  366 QTC Calculation: 482 R Axis:   -7 Text Interpretation:  Sinus tachycardia Inferior infarct, old No significant change since last tracing 21 Nov 2016 Confirmed by Rolland Porter [phone]) on 02/05/2017 2:38:31 AM       Radiology Ct Abdomen Pelvis W Contrast  Result Date: 02/03/2017 CLINICAL DATA:  Abdominal pain EXAM: CT ABDOMEN AND PELVIS WITH CONTRAST TECHNIQUE: Multidetector CT imaging of the abdomen and pelvis was performed using the standard protocol following bolus administration of intravenous contrast. CONTRAST:  193mL ISOVUE-300 IOPAMIDOL (ISOVUE-300) INJECTION 61% COMPARISON:  10/26/2016 FINDINGS: Lower chest: Heart is borderline in size.  Lung bases clear. Hepatobiliary: Diffuse low-density throughout the liver compatible with fatty infiltration. More severe low-density noted anteriorly in both the right and left hepatic lobes suggesting more severe focal fatty infiltration. No focal abnormality. Prior cholecystectomy Pancreas: No focal abnormality or ductal dilatation. Spleen: Splenomegaly. Craniocaudal length of the spleen measures 21 cm. Adrenals/Urinary Tract: No adrenal abnormality. No focal renal abnormality. No stones or hydronephrosis. Urinary bladder is unremarkable. Stomach/Bowel: Stomach, large and small bowel grossly unremarkable. Vascular/Lymphatic: No evidence of aneurysm or adenopathy. Reproductive: Uterus and adnexa unremarkable.  No  mass. Other: No free fluid or free air. Musculoskeletal: No acute findings. IMPRESSION: Severe diffuse fatty infiltration of the liver with more severe focal fatty infiltration anteriorly in both the right and left hepatic lobes. Associated marked splenomegaly. Electronically Signed   By: Rolm Baptise M.D.   On: 02/03/2017 09:02    Procedures Procedures (including critical care time)  Medications Ordered in ED Medications  insulin aspart (novoLOG) injection 22 Units (22 Units Subcutaneous Given 02/05/17 0415)  insulin detemir (LEVEMIR) injection 68 Units (68 Units Subcutaneous Given 02/05/17 0508)  levETIRAcetam (KEPPRA) tablet 500 mg (500 mg Oral Given 02/05/17 0415)  cloNIDine (CATAPRES) tablet 0.1 mg (not administered)  cyclobenzaprine (FLEXERIL) tablet 5 mg (not administered)  dicyclomine (BENTYL) tablet 20 mg (not administered)  Dulaglutide SOPN 1.5 mg (not administered)  gabapentin (NEURONTIN) capsule 400 mg (not administered)  fenofibrate tablet 160 mg (not administered)  lisinopril (PRINIVIL,ZESTRIL) tablet 10 mg (not administered)  ondansetron (ZOFRAN-ODT) disintegrating tablet 8 mg (not administered)  pantoprazole (PROTONIX) EC tablet 40 mg (not administered)  topiramate (TOPAMAX) tablet 100 mg (not administered)  traMADol (ULTRAM) tablet 50 mg (not administered)  sodium chloride 0.9 % bolus 1,000 mL (0 mLs Intravenous Stopped 02/05/17 0552)  LORazepam (ATIVAN) injection 1 mg (1 mg Intravenous Given 02/05/17 0503)  diphenhydrAMINE (BENADRYL) injection 50 mg (50 mg Intramuscular Given 02/05/17 0622)    And  LORazepam (ATIVAN) injection 2 mg (2 mg Intramuscular Given 02/05/17 8588)     Initial Impression / Assessment and Plan / ED Course  I have reviewed the triage vital signs and the nursing notes.  Pertinent labs & imaging results that were available during my care of the patient were reviewed by me and considered in my medical decision making (see chart for details).      31 year old female with a history of anxiety, bipolar 1 disorder, schizophrenia, DM Type II, HTN, PCOS, and rhabdosarcoma in remission  presenting with suicidal ideation and plan.  She has a history of previous suicide attempts and multiple inpatient behavioral health admissions.  She has a self-inflicted superficial laceration to her left wrist.  CBG 368 on arrival.  The patient reports that she has not taken her home nighttime meds, including her Lantus, which has been ordered in the ED. anion gap is not elevated; doubt DKA or HHS.  On reexamination, the patient was found to be eating in her room.  Ordered her home NovoLog.  IV fluid bolus given, with improvement of her CBG.  Her labs are otherwise unremarkable.  She is medically cleared at this time.  On reevaluation, she has attempted to leave her room several times and states that she would like to go home.  The patient was evaluated by TTS who recommended inpatient placement.  Home meds have been ordered. IVC paperwork was completed by Dr. Rolland Porter after the patient continued to try and leave the emergency department.  She is pending behavioral health placement.  Vital signs are stable. Please see psych team notes for further documentation of care/dispo. Pt stable at time of med clearance.    Final Clinical Impressions(s) / ED Diagnoses   Final diagnoses:  Schizoaffective disorder, unspecified type [facility])    ED Discharge Orders    None       Joanne Gavel, PA-C 02/05/17 [place], [place], MD 02/05/17 [phone]

## 2017-02-05 NOTE — ED Notes (Signed)
This morning pt displayed manipulative behaviors and would not redirect. She sat or lay by the exit and banged on the door. She did not go out of her way to disturb other patients, but she did walk down the hallway sometimes and mess with the thermostat or the exit badge readers. After receiving IM medication from night shift this morning she soon calmed down and went to bed. She ate breakfast. She spoke with Dr. Dwyane Dee and said that she sometimes hears a voice telling her to kill herself. She is agreeable to f/u with OP services that she is accustomed to.

## 2017-02-05 NOTE — ED Notes (Signed)
TTS consult in progress. °

## 2017-02-05 NOTE — BH Assessment (Signed)
[facility] Assessment Progress Note  Per Hampton Abbot, MD, this pt does not require psychiatric hospitalization at this time.  Pt presents under IVC, which Dr Dwyane Dee has rescinded.  Pt is to be discharged from [facility] with recommendation to follow up with the [facility] Team.  This has been included in pt's discharge instructions.  Pt's nurse, Diane, has been notified.  Jalene Mullet, [place] Triage Specialist [phone]

## 2017-02-05 NOTE — Discharge Instructions (Signed)
For your mental health needs, you are advised to continue treatment with the [facility] ACT Team:       [facility]      [address]      [place], [place] [address]      [phone]      24 hour crisis number: [phone]

## 2017-02-05 NOTE — ED Notes (Signed)
SBAR Report received from previous nurse. Pt received agitated and non-compliant on unit pt immediately began testing the doors to the unit Pt gave no meaningful answers to assessment questions related to current SI/ HI, A/V H, depression, anxiety, or pain at this time, and appears otherwise stable.  Pt reminded of camera surveillance, q 15 min rounds, and rules of the milieu. Pt provided PRN IM medications to help her regain rationality. Pt currently sittting in the hall Will continue to assess.

## 2017-02-05 NOTE — BH Assessment (Signed)
[facility] Assessment Progress Note  Case discussed with Lindon Romp, NP who recommends inpt treatment. [facility] at capacity for 500 hall beds. TTS to seek placement. EDP McDonald, Mia A, PA-C aware of recommendation. Pt states she wants to walk out of the hospital and does not wish to sign voluntary consent for treatment. EDP advised and agrees to IVC pt if she attempts to elope due to suicide attempt PTA and ongoing SI with a plan to OD on her medication.  Lind Covert, MSW, LCSW Therapeutic Triage Specialist  [phone]
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
## 2017-02-05 NOTE — ED Notes (Signed)
Bed: PJ09 Expected date:  Expected time:  Means of arrival:  Comments: Hallucinations/SI

## 2017-02-05 NOTE — ED Notes (Signed)
Pt discharged home. Discharged instructions read to pt who verbalized understanding. All belongings returned to pt who signed for same. Denies SI/HI, is not delusional and not responding to internal stimuli. Escorted pt to the ED exit.  Pt was calm and cooperative.  She obtained a taxi cab herself for transport home.

## 2017-02-05 NOTE — ED Notes (Signed)
Bed: [facility] Expected date:  Expected time:  Means of arrival:  Comments: Luciana Axe

## 2017-02-05 NOTE — ED Notes (Signed)
Called EDP to review pt's insulin orders.

## 2017-02-06 ENCOUNTER — Other Ambulatory Visit: Payer: Self-pay

## 2017-02-06 ENCOUNTER — Emergency Department (HOSPITAL_COMMUNITY)
Admission: EM | Admit: 2017-02-06 | Discharge: 2017-02-06 | Disposition: A | Discharge status: Other Acute Inpatient Hospital | Payer: Medicare HMO | Attending: Emergency Medicine | Admitting: Emergency Medicine

## 2017-02-06 ENCOUNTER — Encounter (HOSPITAL_COMMUNITY): Payer: Self-pay | Admitting: *Deleted

## 2017-02-06 ENCOUNTER — Encounter (HOSPITAL_COMMUNITY): Payer: Self-pay | Admitting: Emergency Medicine

## 2017-02-06 ENCOUNTER — Inpatient Hospital Stay (HOSPITAL_COMMUNITY)
Admission: AD | Admit: 2017-02-06 | Discharge: 2017-02-09 | DRG: 885 | Disposition: A | Discharge status: Home / Self Care | Payer: Medicare HMO | Source: Intra-hospital | Attending: Psychiatry | Admitting: Psychiatry

## 2017-02-06 DIAGNOSIS — F250 Schizoaffective disorder, bipolar type: Principal | ICD-10-CM | POA: Diagnosis present

## 2017-02-06 DIAGNOSIS — Z888 Allergy status to other drugs, medicaments and biological substances status: Secondary | ICD-10-CM

## 2017-02-06 DIAGNOSIS — F419 Anxiety disorder, unspecified: Secondary | ICD-10-CM | POA: Diagnosis not present

## 2017-02-06 DIAGNOSIS — F609 Personality disorder, unspecified: Secondary | ICD-10-CM

## 2017-02-06 DIAGNOSIS — E1140 Type 2 diabetes mellitus with diabetic neuropathy, unspecified: Secondary | ICD-10-CM | POA: Diagnosis present

## 2017-02-06 DIAGNOSIS — F6089 Other specific personality disorders: Secondary | ICD-10-CM | POA: Diagnosis present

## 2017-02-06 DIAGNOSIS — Z79899 Other long term (current) drug therapy: Secondary | ICD-10-CM | POA: Insufficient documentation

## 2017-02-06 DIAGNOSIS — F333 Major depressive disorder, recurrent, severe with psychotic symptoms: Secondary | ICD-10-CM | POA: Diagnosis not present

## 2017-02-06 DIAGNOSIS — Z794 Long term (current) use of insulin: Secondary | ICD-10-CM | POA: Diagnosis not present

## 2017-02-06 DIAGNOSIS — E119 Type 2 diabetes mellitus without complications: Secondary | ICD-10-CM | POA: Diagnosis not present

## 2017-02-06 DIAGNOSIS — Z91013 Allergy to seafood: Secondary | ICD-10-CM | POA: Diagnosis not present

## 2017-02-06 DIAGNOSIS — T1491XA Suicide attempt, initial encounter: Secondary | ICD-10-CM | POA: Diagnosis not present

## 2017-02-06 DIAGNOSIS — R45851 Suicidal ideations: Secondary | ICD-10-CM | POA: Insufficient documentation

## 2017-02-06 DIAGNOSIS — G4700 Insomnia, unspecified: Secondary | ICD-10-CM | POA: Diagnosis present

## 2017-02-06 DIAGNOSIS — Z781 Physical restraint status: Secondary | ICD-10-CM | POA: Diagnosis not present

## 2017-02-06 DIAGNOSIS — Z915 Personal history of self-harm: Secondary | ICD-10-CM | POA: Diagnosis not present

## 2017-02-06 DIAGNOSIS — I10 Essential (primary) hypertension: Secondary | ICD-10-CM | POA: Insufficient documentation

## 2017-02-06 DIAGNOSIS — F603 Borderline personality disorder: Secondary | ICD-10-CM | POA: Insufficient documentation

## 2017-02-06 DIAGNOSIS — R450 Nervousness: Secondary | ICD-10-CM | POA: Diagnosis not present

## 2017-02-06 DIAGNOSIS — T50902A Poisoning by unspecified drugs, medicaments and biological substances, intentional self-harm, initial encounter: Secondary | ICD-10-CM | POA: Diagnosis not present

## 2017-02-06 DIAGNOSIS — F99 Mental disorder, not otherwise specified: Secondary | ICD-10-CM | POA: Diagnosis not present

## 2017-02-06 DIAGNOSIS — Z885 Allergy status to narcotic agent status: Secondary | ICD-10-CM

## 2017-02-06 DIAGNOSIS — F4310 Post-traumatic stress disorder, unspecified: Secondary | ICD-10-CM | POA: Diagnosis present

## 2017-02-06 MED ORDER — LORAZEPAM 1 MG PO TABS
1.0000 mg | ORAL_TABLET | Freq: Once | ORAL | Status: AC
  Administered 2017-02-06: 1 mg via ORAL
  Filled 2017-02-06: qty 1

## 2017-02-06 MED ORDER — BENZTROPINE MESYLATE 1 MG/ML IJ SOLN
2.0000 mg | Freq: Once | INTRAMUSCULAR | Status: AC
  Administered 2017-02-06: 2 mg via INTRAMUSCULAR
  Filled 2017-02-06: qty 2

## 2017-02-06 MED ORDER — LORAZEPAM 1 MG PO TABS
1.0000 mg | ORAL_TABLET | ORAL | Status: DC | PRN
  Filled 2017-02-06 (×2): qty 1

## 2017-02-06 MED ORDER — TRAZODONE HCL 50 MG PO TABS
50.0000 mg | ORAL_TABLET | Freq: Every evening | ORAL | Status: DC | PRN
  Administered 2017-02-06 – 2017-02-08 (×3): 50 mg via ORAL
  Filled 2017-02-06 (×3): qty 1

## 2017-02-06 MED ORDER — ZIPRASIDONE MESYLATE 20 MG IM SOLR
10.0000 mg | Freq: Once | INTRAMUSCULAR | Status: AC
  Administered 2017-02-06: 10 mg via INTRAMUSCULAR
  Filled 2017-02-06: qty 20

## 2017-02-06 MED ORDER — QUETIAPINE FUMARATE 200 MG PO TABS
200.0000 mg | ORAL_TABLET | Freq: Every day | ORAL | Status: DC
  Administered 2017-02-06: 200 mg via ORAL
  Filled 2017-02-06 (×3): qty 1

## 2017-02-06 MED ORDER — STERILE WATER FOR INJECTION IJ SOLN
INTRAMUSCULAR | Status: AC
  Administered 2017-02-06: 1.2 mL
  Filled 2017-02-06: qty 10

## 2017-02-06 MED ORDER — ZIPRASIDONE MESYLATE 20 MG IM SOLR
20.0000 mg | INTRAMUSCULAR | Status: AC | PRN
  Administered 2017-02-07: 20 mg via INTRAMUSCULAR
  Filled 2017-02-06 (×2): qty 20

## 2017-02-06 MED ORDER — LORAZEPAM 1 MG PO TABS
1.0000 mg | ORAL_TABLET | ORAL | Status: AC | PRN
  Administered 2017-02-06: 1 mg via ORAL
  Filled 2017-02-06: qty 1

## 2017-02-06 MED ORDER — ACETAMINOPHEN 325 MG PO TABS: 650.0000 mg | ORAL_TABLET | Freq: Four times a day (QID) | ORAL | Status: DC | PRN

## 2017-02-06 MED ORDER — MAGNESIUM HYDROXIDE 400 MG/5ML PO SUSP: 30.0000 mL | Freq: Every day | ORAL | Status: DC | PRN

## 2017-02-06 MED ORDER — LORAZEPAM 2 MG/ML IJ SOLN
1.0000 mg | Freq: Once | INTRAMUSCULAR | Status: AC
  Administered 2017-02-06: 1 mg via INTRAMUSCULAR
  Filled 2017-02-06: qty 1

## 2017-02-06 MED ORDER — OLANZAPINE 10 MG PO TBDP
10.0000 mg | ORAL_TABLET | Freq: Three times a day (TID) | ORAL | Status: DC | PRN
  Filled 2017-02-06: qty 1

## 2017-02-06 MED ORDER — ALUM & MAG HYDROXIDE-SIMETH 200-200-20 MG/5ML PO SUSP: 30.0000 mL | ORAL | Status: DC | PRN

## 2017-02-06 MED ORDER — STERILE WATER FOR INJECTION IJ SOLN
INTRAMUSCULAR | Status: AC
  Filled 2017-02-06: qty 10

## 2017-02-06 MED ORDER — HYDROXYZINE HCL 25 MG PO TABS
25.0000 mg | ORAL_TABLET | Freq: Three times a day (TID) | ORAL | Status: DC | PRN
  Administered 2017-02-06: 25 mg via ORAL
  Filled 2017-02-06: qty 1

## 2017-02-06 MED ORDER — BENZTROPINE MESYLATE 1 MG PO TABS
1.0000 mg | ORAL_TABLET | ORAL | Status: AC
  Administered 2017-02-06: 1 mg via ORAL
  Filled 2017-02-06 (×2): qty 1

## 2017-02-06 MED ORDER — OLANZAPINE 10 MG PO TBDP
10.0000 mg | ORAL_TABLET | Freq: Three times a day (TID) | ORAL | Status: DC | PRN
  Filled 2017-02-06 (×2): qty 1

## 2017-02-06 NOTE — Progress Notes (Signed)
1:1 Note 1900   Patient went to dining room for dinner, went to door and would not move.  Patient did walk in hallway and sat in floor.  STAR called and patient assisted to restraint chair in hallway and brought to quiet room.  Patient presently in her own room, walking and talking to 1:1.  Respirations even and unlabored.  No signs/symptoms of pain/distress noted on patient's face/body movements.  1:1 for patient's safety per MD order.

## 2017-02-06 NOTE — Progress Notes (Signed)
Nursing 1:1 Note @ 3353 Next Note @ 0300   D: Pt observed sleeping in bed with eyes closed. RR even and unlabored. No distress noted.  A: 1:1 observation continues for safety. Sitter within arms reach.  R: Pt remains safe.

## 2017-02-06 NOTE — ED Notes (Signed)
ED Provider at bedside. 
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
## 2017-02-06 NOTE — Progress Notes (Signed)
Patient was physically assessed by this provider after being placed on a 4 point restraints due to being physically a danger to herself. At this time, patient does not appear to be in any distress. Patient was sitting up in chair eating chocolate ice cream. She denies any chest pain or SOB. She also denies any acute concerns. Normal circulations and pulse noted to the points of restraints.   Review of Systems  Constitutional: Negative.   HENT: Negative.   Eyes: Negative.   Respiratory: Negative.   Cardiovascular: Negative.   Gastrointestinal: Negative.   Musculoskeletal: Negative.   Skin: Negative.   Neurological: Negative.   Psychiatric/Behavioral: Positive for depression and suicidal ideas.    Physical Exam  Constitutional: She is oriented to person, place, and time.  HENT:  Head: Normocephalic.  Eyes: Pupils are equal, round, and reactive to light.  Neck: Normal range of motion.  Cardiovascular: Normal rate and regular rhythm.  Pulmonary/Chest: Effort normal and breath sounds normal.  Abdominal: Soft. Bowel sounds are normal.  Musculoskeletal: Normal range of motion.  Neurological: She is alert and oriented to person, place, and time.  Skin: Skin is warm and dry.   Vitals:   02/06/17 1712 02/06/17 1716  BP: (!) 105/58 (!) 105/58  Pulse: 94 94  Resp: 16   Temp:    SpO2:       Signed: Justina A. Lu Duffel, NP

## 2017-02-06 NOTE — ED Notes (Addendum)
Pt continuing to act defiantly. Started messing with mounted equipment, such as the sharps container, repeatedly making noise. Attempting to walk out of room. Returned to room by [employer] and [employer]. [employer] officers having to remain at doorway as pt remains there as well, looking to step out whenever possible.

## 2017-02-06 NOTE — Progress Notes (Signed)
CS received call from [place] from [place] wanting to know which [place] pt had been taken to. CSW informed her that pt is at [facility] near [place]. No further CSW needs. CSW signing off.     Virgie Dad Samai Corea, MSW, [place] [facility] Clinical Social Worker [phone]

## 2017-02-06 NOTE — ED Notes (Signed)
[employer] notified of transport need to [facility]

## 2017-02-06 NOTE — ED Notes (Signed)
TTS device in room. [facility] counselor attempting assessment. Pt pacing around in room. Continues to demand we release her to home, where she states she'll take her pills and kill herself.

## 2017-02-06 NOTE — [facility] Suicide Risk Assessment (Signed)
[facility] Admission Suicide Risk Assessment   Nursing information obtained from:    Demographic factors:    Current Mental Status:    Loss Factors:    Historical Factors:    Risk Reduction Factors:     Total Time spent with patient: 1 hour Principal Problem: Schizoaffective disorder, bipolar type ([place]) Diagnosis:   Patient Active Problem List   Diagnosis Date Noted  . Seizures ([place]) [R56.9] 12/11/2016  . Acute lower UTI [N39.0] 12/11/2016  . DM2 (diabetes mellitus, type 2) ([place]) [E11.9] 12/11/2016  . Uncontrolled diabetes mellitus ([place]) [E11.65] 11/03/2016  . Schizoaffective disorder, bipolar type ([place]) [F25.0] 11/02/2016  . 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 Schizoaffective disorder, mixed type ([place]) [F25.0] 05/30/2014  . Suicidal ideation [R45.851]   . 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 Headache [R51] 04/15/2013  . HTN (hypertension) [I10] 04/15/2013  . Post traumatic stress disorder [F43.10] 12/07/2011  . CAP (community acquired pneumonia) [J18.9] 08/27/2011  . Chest pain [R07.9] 08/26/2011  . SOB (shortness of breath) [R06.02] 08/26/2011  . Fever [R50.9] 08/26/2011  . Hypokalemia [E87.6] 08/26/2011  . PSVT (paroxysmal supraventricular tachycardia) ([place]) [I47.1] 08/26/2011  . ADHD [F90.9] 09/23/2007  . EPIGASTRIC PAIN [R10.13] 09/23/2007  . Obesity, unspecified [E66.9] 07/30/2007  . DEPRESSION [F32.9] 07/30/2007  . SLEEP DISORDER [G47.9] 07/30/2007  . IMPAIRED FASTING GLUCOSE [R73.01] 07/30/2007  . FATIGUE [R53.81, R53.83] 11/21/2006  . ABNORMAL FINDINGS, ELEVATED BP W/O HTN [R03.0] 11/21/2006  . METRORRHAGIA [N92.1] 06/13/2006  . DISORDER, MENSTRUAL NEC [N94.9] 06/13/2006  . DIZZINESS [R42] 06/13/2006  . POLYCYSTIC OVARIAN DISEASE [E28.2] 04/25/2006  . AMENORRHEA, SECONDARY [N91.2] 04/20/2006  . ACNE, MILD [L70.8] 04/20/2006  . ABDOMINAL PAIN [R10.9] 04/20/2006   Subjective Data: See H&P for full HPI Tina Saunders is a 32 y/o F with  history of schizoaffective disorder bipolar type and Cluster B personality disorder who was admitted on IVC after she had a suicide attempt via overdose. Pt was brought in by ambulance after she called 911 and reported that she had ingested a handful of pills in a suicide attempt. When emergency services arrived on the scene, pt was agitated and told EMS staff she was upset that her suicide attempt was not successful. In the ED she remained agitated and disorganized; she attempted to leave multiple times and was messing with objects in the room such as the sharps container. Due to her out of control behaviors and suicide attempt, pt was placed on IVC and transferred to [facility] for additional treatment and stabilization. She remained too drowsy to participate in the intake interview, so she will be allowed to rest until sensorium improves, and she will be started on PRN protocol for agitation management.  Continued Clinical Symptoms:    The "Alcohol Use Disorders Identification Test", Guidelines for Use in Primary Care, Second Edition.  World Pharmacologist [facility]). Score between 0-7:  no or low risk or alcohol related problems. Score between 8-15:  moderate risk of alcohol related problems. Score between 16-19:  high risk of alcohol related problems. Score 20 or above:  warrants further diagnostic evaluation for alcohol dependence and treatment.   CLINICAL FACTORS:   Severe Anxiety and/or Agitation Bipolar Disorder:   Depressive phase Schizophrenia:   Depressive state   Musculoskeletal: Strength & Muscle Tone: within normal limits Gait & Station: normal Patient leans: N/A  Psychiatric Specialty Exam: Physical Exam  Nursing note and vitals reviewed.   ROS - see H&P  Blood pressure 119/74, pulse Marland Kitchen)  119, temperature 98.2 F (36.8 C), temperature source Oral, resp. rate 18, last menstrual period 02/01/2017, SpO2 96 %.There is no height or weight on file to calculate BMI.  General Appearance:  Casual and Fairly Groomed  Eye Contact:  Poor  Speech:  Garbled and Slow  Volume:  Decreased  Mood:  Anxious and Depressed  Affect:  Blunt, Congruent, Constricted and Depressed  Thought Process:  Coherent and Goal Directed  Orientation:  Full (Time, Place, and Person)  Thought Content:  Logical  Suicidal Thoughts:  Yes.  without intent/plan  Homicidal Thoughts:  No  Memory:  Immediate;   Fair Recent;   Fair Remote;   Fair  Judgement:  Poor  Insight:  Lacking  Psychomotor Activity:  Normal  Concentration:  Concentration: Poor and Attention Span: Poor  Recall:  Poor  Fund of Knowledge:  Fair  Language:  Fair  Akathisia:  No  Handed:    AIMS (if indicated):     Assets:  Psychiatrist Social Support  ADL's:  Intact  Cognition:  WNL  Sleep:           COGNITIVE FEATURES THAT CONTRIBUTE TO RISK:  None    SUICIDE RISK:   Moderate:  Frequent suicidal ideation with limited intensity, and duration, some specificity in terms of plans, no associated intent, good self-control, limited dysphoria/symptomatology, some risk factors present, and identifiable protective factors, including available and accessible social support.  PLAN OF CARE:   - Admit to inpatient level of care  -Schizoaffective disorder, Bipolar Type  - Start agitation protocol   - Hold home medications until sensorium improved  -Anxiety  - Start atarax 25mg  po q6h prn anxiety  -Insomnia   - Start trazodone 50mg  po qhs prn insomnia  -Encourage participation in groups and the therapeutic milieu  -Discharge planning will be ongoing  I certify that inpatient services furnished can reasonably be expected to improve the patient's condition.   Pennelope Bracken, MD 02/06/2017, 5:09 PM

## 2017-02-06 NOTE — H&P (Signed)
Psychiatric Admission Assessment Adult  Patient Identification: Tina Saunders MRN:  [id number] Date of Evaluation:  02/06/2017 Chief Complaint:  bipolar disorder Principal Diagnosis: Schizoaffective disorder, bipolar type ([place]) Diagnosis:   Patient Active Problem List   Diagnosis Date Noted  . Seizures ([place]) [R56.9] 12/11/2016  . Acute lower UTI [N39.0] 12/11/2016  . DM2 (diabetes mellitus, type 2) ([place]) [E11.9] 12/11/2016  . Uncontrolled diabetes mellitus ([place]) [E11.65] 11/03/2016  . Schizoaffective disorder, bipolar type ([place]) [F25.0] 11/02/2016  . Cluster B personality disorder ([place]) [F60.9] 11/02/2016  . Schizoaffective disorder, mixed type ([place]) [F25.0] 05/30/2014  . Suicidal ideation [R45.851]   . 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 Headache [R51] 04/15/2013  . HTN (hypertension) [I10] 04/15/2013  . Post traumatic stress disorder [F43.10] 12/07/2011  . CAP (community acquired pneumonia) [J18.9] 08/27/2011  . Chest pain [R07.9] 08/26/2011  . SOB (shortness of breath) [R06.02] 08/26/2011  . Fever [R50.9] 08/26/2011  . Hypokalemia [E87.6] 08/26/2011  . PSVT (paroxysmal supraventricular tachycardia) ([place]) [I47.1] 08/26/2011  . ADHD [F90.9] 09/23/2007  . EPIGASTRIC PAIN [R10.13] 09/23/2007  . Obesity, unspecified [E66.9] 07/30/2007  . DEPRESSION [F32.9] 07/30/2007  . SLEEP DISORDER [G47.9] 07/30/2007  . IMPAIRED FASTING GLUCOSE [R73.01] 07/30/2007  . FATIGUE [R53.81, R53.83] 11/21/2006  . ABNORMAL FINDINGS, ELEVATED BP W/O HTN [R03.0] 11/21/2006  . METRORRHAGIA [N92.1] 06/13/2006  . DISORDER, MENSTRUAL NEC [N94.9] 06/13/2006  . DIZZINESS [R42] 06/13/2006  . POLYCYSTIC OVARIAN DISEASE [E28.2] 04/25/2006  . AMENORRHEA, SECONDARY [N91.2] 04/20/2006  . ACNE, MILD [L70.8] 04/20/2006  . ABDOMINAL PAIN [R10.9] 04/20/2006   History of Present Illness:   Tina Saunders is a 32 y/o F with history of schizoaffective disorder bipolar type and Cluster B personality  disorder who was admitted on IVC after she had a suicide attempt via overdose. Pt was brought in by ambulance after she called 911 and reported that she had ingested a handful of pills in a suicide attempt. When emergency services arrived on the scene, pt was agitated and told EMS staff she was upset that her suicide attempt was not successful. In the ED she remained agitated and disorganized; she attempted to leave multiple times and was messing with objects in the room such as the sharps container. Due to her out of control behaviors and suicide attempt, pt was placed on IVC and transferred to [facility] for additional treatment and stabilization.  Upon initial presentation at [facility], pt is drowsy to the degree she is unable to participate in the interview without falling asleep. Pt falls asleep mid sentence multiple times during the interview. She had received IM PRN medication to manage her agitation at the [facility] ED.  Pt was asked about her reasons for presenting to the hospital, and she replied, "I tried to kill myself." Pt endorses SI for the past 2 days without specific plan. She states her attempt was impulsive and she is unable to identify any specific trigger prior to her attempt. Pt is unable to identify which pills she consumed in the overdose. She reports her sleep has been adequate at home but her mood and anxiety have been getting worse recently. She denies SI/HI/AH/VH. She denies recent illicit substance use.  Attempted to discuss with patient about treatment options; however, she is too drowsy to engage in treatment planning. She notes that she follows up with [facility] Team, and we will verify her medications with them. As pt has ongoing drowsiness, we will wait until she has improved sensorium prior to discussing medication  options. She will be supported with PRN medications for agitation, and she will remain admitted on the inpatient psychiatry unit.  Associated Signs/Symptoms: Depression  Symptoms:  depressed mood, anhedonia, suicidal attempt, anxiety, (Hypo) Manic Symptoms:  NA Anxiety Symptoms:  Excessive Worry, Psychotic Symptoms:  NA PTSD Symptoms: NA Total Time spent with patient: 1 hour  Past Psychiatric History: pt is too drowsy to provide full history. She confirms outpatient follow up at [facility] team. She reports history of suicide attempt and inpatient admissions, but unable to provider further details.    Is the patient at risk to self? Yes.    Has the patient been a risk to self in the past 6 months? Yes.    Has the patient been a risk to self within the distant past? Yes.    Is the patient a risk to others? Yes.    Has the patient been a risk to others in the past 6 months? Yes.    Has the patient been a risk to others within the distant past? Yes.     Prior Inpatient Therapy:   Prior Outpatient Therapy:    Alcohol Screening:   Substance Abuse History in the last 12 months:  No. Consequences of Substance Abuse: NA Previous Psychotropic Medications: Yes  Psychological Evaluations: Yes  Past Medical History:  Past Medical History:  Diagnosis Date  . Anxiety   . Bipolar 1 disorder ([place])   . Cancer of abdominal wall   . Depression   . Diabetes mellitus without complication ([place])   . Hypertension   . Obesity   . Obesity   . Polycystic ovarian syndrome 07/01/2011   Patient report  . 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 Schizophrenia [facility])     Past Surgical History:  Procedure Laterality Date  . CHOLECYSTECTOMY    . HERNIA REPAIR    . Ovarian Cyst Excision    . VARICOSE VEIN SURGERY     Family History:  Family History  Problem Relation Age of Onset  . Coronary artery disease Maternal Grandmother   . Diabetes type II Maternal Grandmother   . Cancer Maternal Grandmother   . Hypertension Mother   . Hypertension Father    Family Psychiatric  History: unable to assess Tobacco Screening:   Social History:  Social History   Substance and Sexual  Activity  Alcohol Use No     Social History   Substance and Sexual Activity  Drug Use No    Additional Social History:                           Allergies:   Allergies  Allergen Reactions  . Fish-Derived Products Anaphylaxis    Can only eat FLounder  . Geodon [Ziprasidone Hcl] Other (See Comments)    Face pulls, cant swallow - Locked Jaw  . Haldol [Haloperidol Lactate] Other (See Comments)    Face pulls, can't swallow - Locked Jaw  . Buprenorphine Hcl Hives, Itching, Rash and Other (See Comments)    GI upset  . Compazine [Prochlorperazine] Other (See Comments)    anxiety and hyperactivity  . Morphine And Related Hives, Itching, Rash and Other (See Comments)    GI upset  . Toradol [Ketorolac Tromethamine] Other (See Comments)    Anxiety and hyperactivity   Lab Results:  Results for orders placed or performed during the hospital encounter of 02/05/17 (from the past 48 hour(s))  POC CBG, ED     Status: Abnormal  Collection Time: 02/05/17  2:08 AM  Result Value Ref Range   Glucose-Capillary 386 (H) 65 - 99 mg/dL  Urinalysis, Routine w reflex microscopic     Status: Abnormal   Collection Time: 02/05/17  2:16 AM  Result Value Ref Range   Color, Urine YELLOW YELLOW   APPearance HAZY (A) CLEAR   Specific Gravity, Urine 1.031 (H) 1.005 - 1.030   pH 7.0 5.0 - 8.0   Glucose, UA >=500 (A) NEGATIVE mg/dL   Hgb urine dipstick NEGATIVE NEGATIVE   Bilirubin Urine NEGATIVE NEGATIVE   Ketones, ur NEGATIVE NEGATIVE mg/dL   Protein, ur NEGATIVE NEGATIVE mg/dL   Nitrite NEGATIVE NEGATIVE   Leukocytes, UA SMALL (A) NEGATIVE   RBC / HPF 0-5 0 - 5 RBC/hpf   WBC, UA 6-30 0 - 5 WBC/hpf   Bacteria, UA NONE SEEN NONE SEEN   Squamous Epithelial / LPF 6-30 (A) NONE SEEN   Mucus PRESENT    Ca Oxalate Crys, UA PRESENT   CBC     Status: Abnormal   Collection Time: 02/05/17  2:24 AM  Result Value Ref Range   WBC 4.9 4.0 - 10.5 K/uL   RBC 4.29 3.87 - 5.11 MIL/uL   Hemoglobin 10.9  (L) 12.0 - 15.0 g/dL   HCT 34.4 (L) 36.0 - 46.0 %   MCV 80.2 78.0 - 100.0 fL   MCH 25.4 (L) 26.0 - 34.0 pg   MCHC 31.7 30.0 - 36.0 g/dL   RDW 16.1 (H) 11.5 - 15.5 %   Platelets 138 (L) 150 - 400 K/uL  Comprehensive metabolic panel     Status: Abnormal   Collection Time: 02/05/17  2:24 AM  Result Value Ref Range   Sodium 134 (L) 135 - 145 mmol/L   Potassium 3.5 3.5 - 5.1 mmol/L   Chloride 103 101 - 111 mmol/L   CO2 21 (L) 22 - 32 mmol/L   Glucose, Bld 420 (H) 65 - 99 mg/dL   BUN 7 6 - 20 mg/dL   Creatinine, Ser 0.50 0.44 - 1.00 mg/dL   Calcium 8.7 (L) 8.9 - 10.3 mg/dL   Total Protein 7.5 6.5 - 8.1 g/dL   Albumin 3.9 3.5 - 5.0 g/dL   AST 44 (H) 15 - 41 U/L   ALT 28 14 - 54 U/L   Alkaline Phosphatase 113 38 - 126 U/L   Total Bilirubin 0.2 (L) 0.3 - 1.2 mg/dL   GFR calc non Af Amer >60 >60 mL/min   GFR calc Af Amer >60 >60 mL/min    Comment: (NOTE) The eGFR has been calculated using the CKD EPI equation. This calculation has not been validated in all clinical situations. eGFR's persistently <60 mL/min signify possible Chronic Kidney Disease.    Anion gap 10 5 - 15  Lipase, blood     Status: None   Collection Time: 02/05/17  2:24 AM  Result Value Ref Range   Lipase 30 11 - 51 U/L  I-Stat Beta hCG blood, ED ([facility], [facility], [facility] only)     Status: None   Collection Time: 02/05/17  2:36 AM  Result Value Ref Range   I-stat hCG, quantitative <5.0 <5 mIU/mL   Comment 3            Comment:   GEST. AGE      CONC.  (mIU/mL)   <=1 WEEK        5 - 50     2 WEEKS       50 -  500     3 WEEKS       100 - 10,000     4 WEEKS     1,000 - 30,000        FEMALE AND NON-PREGNANT FEMALE:     LESS THAN 5 mIU/mL   CBG monitoring, ED     Status: Abnormal   Collection Time: 02/05/17  5:06 AM  Result Value Ref Range   Glucose-Capillary 368 (H) 65 - 99 mg/dL  CBG monitoring, ED     Status: Abnormal   Collection Time: 02/05/17  5:40 AM  Result Value Ref Range   Glucose-Capillary 318 (H) 65 - 99 mg/dL   CBG monitoring, ED     Status: Abnormal   Collection Time: 02/05/17  7:43 AM  Result Value Ref Range   Glucose-Capillary 212 (H) 65 - 99 mg/dL    Blood Alcohol level:  Lab Results  Component Value Date   ETH <10 10/31/2016   ETH <5 22/48/2500    Metabolic Disorder Labs:  Lab Results  Component Value Date   HGBA1C 10.9 (H) 11/03/2016   MPG 266.13 11/03/2016   Lab Results  Component Value Date   PROLACTIN 63.7 (H) 11/03/2016   PROLACTIN 10.9 06/13/2006   Lab Results  Component Value Date   CHOL 113 11/03/2016   TRIG 366 (H) 11/03/2016   HDL 19 (L) 11/03/2016   CHOLHDL 5.9 11/03/2016   VLDL 73 (H) 11/03/2016   LDLCALC 21 11/03/2016   LDLCALC 42 04/26/2006    Current Medications: No current facility-administered medications for this encounter.    PTA Medications: Medications Prior to Admission  Medication Sig Dispense Refill Last Dose  . busPIRone (BUSPAR) 15 MG tablet Take 1 tablet (15 mg total) by mouth 3 (three) times daily. 90 tablet 0 02/04/2017 at Unknown time  . cephALEXin (KEFLEX) 500 MG capsule Take 1 capsule (500 mg total) by mouth 3 (three) times daily. 21 capsule 0 Past Week at Unknown time  . chlorproMAZINE (THORAZINE) 50 MG tablet Take 1 tablet (50 mg total) by mouth 3 (three) times daily.   02/04/2017 at Unknown time  . cloNIDine (CATAPRES) 0.1 MG tablet Take 1 tablet (0.1 mg total) by mouth 2 (two) times daily. 60 tablet 11 02/04/2017 at Unknown time  . cyclobenzaprine (FLEXERIL) 5 MG tablet Take 5 mg by mouth 2 (two) times daily.    02/04/2017 at Unknown time  . dicyclomine (BENTYL) 20 MG tablet Take 20 mg by mouth every 8 (eight) hours as needed for spasms.   Past Week at Unknown time  . Dulaglutide (TRULICITY) 1.5 BB/0.4UG SOPN Inject 1.5 mg into the skin every Friday.   02/02/2017  . fenofibrate (TRICOR) 145 MG tablet Take 145 mg daily by mouth.   Past Week at Unknown time  . gabapentin (NEURONTIN) 400 MG capsule Take 1 capsule (400 mg total) by mouth 3  (three) times daily. 90 capsule 0 02/04/2017 at Unknown time  . ibuprofen (ADVIL,MOTRIN) 800 MG tablet Take 800 mg by mouth every 8 (eight) hours as needed for moderate pain.    Past Month at Unknown time  . insulin aspart (NOVOLOG) 100 UNIT/ML injection Inject 22 Units into the skin 3 (three) times daily with meals as needed for high blood sugar.    02/04/2017 at Unknown time  . insulin detemir (LEVEMIR) 100 UNIT/ML injection Inject 0.68 mLs (68 Units total) into the skin 2 (two) times daily.   02/04/2017 at Unknown time  . levETIRAcetam (KEPPRA) 500 MG  tablet Take 1 tablet (500 mg total) by mouth 2 (two) times daily. 60 tablet 0 02/04/2017 at Unknown time  . lisinopril (PRINIVIL,ZESTRIL) 10 MG tablet Take 10 mg by mouth daily.    02/04/2017 at Unknown time  . metroNIDAZOLE (METROGEL VAGINAL) 0.75 % vaginal gel Place 1 Applicatorful vaginally 2 (two) times daily. (Patient not taking: Reported on 01/31/2017) 70 g 0 Completed Course at Unknown time  . ondansetron (ZOFRAN ODT) 8 MG disintegrating tablet Take 1 tablet (8 mg total) by mouth every 8 (eight) hours as needed for nausea or vomiting. 10 tablet 0 Past Week at Unknown time  . pantoprazole (PROTONIX) 40 MG tablet Take 40 mg by mouth daily.   02/04/2017 at Unknown time  . QUEtiapine (SEROQUEL XR) 200 MG 24 hr tablet Take 500 mg by mouth at bedtime.   Past Week at Unknown time  . QUEtiapine (SEROQUEL) 100 MG tablet Take 100 mg by mouth daily.   02/04/2017 at Unknown time  . sertraline (ZOLOFT) 100 MG tablet Take 2 tablets (200 mg total) by mouth every morning. 60 tablet 0 02/04/2017 at Unknown time  . topiramate (TOPAMAX) 100 MG tablet Take 1 tablet (100 mg total) by mouth 2 (two) times daily. 60 tablet 0 02/04/2017 at Unknown time  . traMADol (ULTRAM) 50 MG tablet Take 50 mg by mouth 2 (two) times daily as needed for moderate pain.    Past Week at Unknown time  . traZODone (DESYREL) 50 MG tablet Take 1 tablet (50 mg total) by mouth at bedtime as needed for  sleep. (Patient taking differently: Take 25 mg by mouth 2 (two) times daily. ) 30 tablet 0 02/04/2017 at Unknown time    Musculoskeletal: Strength & Muscle Tone: within normal limits Gait & Station: normal Patient leans: N/A  Psychiatric Specialty Exam: Physical Exam  Nursing note and vitals reviewed.   Review of Systems  Constitutional: Positive for malaise/fatigue. Negative for chills and fever.  Cardiovascular: Negative for chest pain and palpitations.  Psychiatric/Behavioral: Positive for depression and suicidal ideas. Negative for hallucinations and substance abuse. The patient is nervous/anxious.     Blood pressure 119/74, pulse (!) 119, temperature 98.2 F (36.8 C), temperature source Oral, resp. rate 18, last menstrual period 02/01/2017, SpO2 96 %.There is no height or weight on file to calculate BMI.  General Appearance: Casual and Fairly Groomed  Eye Contact:  Poor  Speech:  Garbled and Slow  Volume:  Decreased  Mood:  Anxious and Depressed  Affect:  Blunt, Congruent, Constricted and Depressed  Thought Process:  Coherent and Goal Directed  Orientation:  Full (Time, Place, and Person)  Thought Content:  Logical  Suicidal Thoughts:  Yes.  without intent/plan  Homicidal Thoughts:  No  Memory:  Immediate;   Fair Recent;   Fair Remote;   Fair  Judgement:  Poor  Insight:  Lacking  Psychomotor Activity:  Normal  Concentration:  Concentration: Poor and Attention Span: Poor  Recall:  Poor  Fund of Knowledge:  Fair  Language:  Fair  Akathisia:  No  Handed:    AIMS (if indicated):     Assets:  Communication Skills Resilience Social Support  ADL's:  Intact  Cognition:  WNL  Sleep:       Treatment Plan Summary: Daily contact with patient to assess and evaluate symptoms and progress in treatment and Medication management  Observation Level/Precautions:  15 minute checks  Laboratory:  CBC Chemistry Profile HbAIC HCG UDS  Psychotherapy:  Encourage participation in  groups and the therapeutic milieu  Medications:  Start PRN protocol for agitation, hold other home medications which may cause sedation  Consultations:  none  Discharge Concerns:    Estimated LOS: 5-7 days  Other:     Physician Treatment Plan for Primary Diagnosis: Schizoaffective disorder, bipolar type ([place]) Long Term Goal(s): Improvement in symptoms so as ready for discharge  Short Term Goals: Compliance with prescribed medications will improve  Physician Treatment Plan for Secondary Diagnosis: Principal Problem:   Schizoaffective disorder, bipolar type ([place]) Active Problems:   Cluster B personality disorder ([place])  Long Term Goal(s): Improvement in symptoms so as ready for discharge  Short Term Goals: Ability to identify triggers associated with substance abuse/mental health issues will improve  I certify that inpatient services furnished can reasonably be expected to improve the patient's condition.    Pennelope Bracken, MD 1/15/20194:55 PM

## 2017-02-06 NOTE — Plan of Care (Signed)
Nurse discussed anxiety, depression, coping skills with patient. 

## 2017-02-06 NOTE — ED Notes (Signed)
Pt departed ED w/ [employer]. Belongings, medication, and valuables sent with [employer]. Pt signature not obtained prior to d/c.
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
## 2017-02-06 NOTE — Progress Notes (Signed)
Patient came to [facility] and was taken to admission room.  Patient in wheelchair.  Staff attempted to talk to patient and tried several times to wake her.  Patient continued to sit in wheelchair slumped over.  Patient's skin assessment completed when 2 staff members held patient up while another staff member checked patient's skin.  Patient was lowered into wheelchair by staff and brought to her bed to sleep.  Patient has been sleeping since admission to [facility].  No questions were answered by patient, no assessment could be completed. Respirations even and unlabored.  No signs/symptoms of pain/distress noted on patient's face/body movements. Security and MHT present when medications were put in patient's locker.  Also patient had security valuables envelope from hospital which was not opened at [facility].  Security guard and staff placed these belongings in locker 30 while we were on camera. 15 minute checks continue for patient's safety.  MD and charge nurse/AC informed of patient's arrival and condition.

## 2017-02-06 NOTE — ED Notes (Signed)
Pt belongings placed in [place] [address] in [place] [facility].

## 2017-02-06 NOTE — Progress Notes (Signed)
Pt refused to sign admission paperwork/consents. Will pass on to day team.

## 2017-02-06 NOTE — ED Provider Notes (Signed)
[facility] EMERGENCY DEPARTMENT Provider Note   CSN: [id number] Arrival date & time: 02/06/17  0814     History   Chief Complaint Chief Complaint  Patient presents with  . Suicidal    HPI SHARNESE Saunders is a 32 y.o. female.  Patient was brought to the emergency department for evaluation after overdose.  Patient called 911 after she took a handful of all of her medications and then fell asleep.  She was trying to kill herself.  After EMS arrived on the scene she became agitated and expressed anger that she did not die.  She has been uncooperative during transport and here in the ER.  She will not answer any questions. Level V Caveat due to psychiatric condition.      Past Medical History:  Diagnosis Date  . Anxiety   . Bipolar 1 disorder ([place])   . Cancer of abdominal wall   . Depression   . Diabetes mellitus without complication ([place])   . Hypertension   . Obesity   . Obesity   . Polycystic ovarian syndrome 07/01/2011   Patient report  . 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 Schizophrenia [facility])     Patient Active Problem List   Diagnosis Date Noted  . Seizures ([place]) 12/11/2016  . Acute lower UTI 12/11/2016  . DM2 (diabetes mellitus, type 2) ([place]) 12/11/2016  . Uncontrolled diabetes mellitus ([place]) 11/03/2016  . Schizoaffective disorder, bipolar type ([place]) 11/02/2016  . Cluster B personality disorder ([place]) 11/02/2016  . Schizoaffective disorder, mixed type ([place]) 05/30/2014  . Suicidal ideation   . 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 Headache 04/15/2013  . HTN (hypertension) 04/15/2013  . Post traumatic stress disorder 12/07/2011  . CAP (community acquired pneumonia) 08/27/2011  . Chest pain 08/26/2011  . SOB (shortness of breath) 08/26/2011  . Fever 08/26/2011  . Hypokalemia 08/26/2011  . PSVT (paroxysmal supraventricular tachycardia) ([place]) 08/26/2011  . ADHD 09/23/2007  . EPIGASTRIC PAIN 09/23/2007  . Obesity, unspecified 07/30/2007  . DEPRESSION  07/30/2007  . SLEEP DISORDER 07/30/2007  . IMPAIRED FASTING GLUCOSE 07/30/2007  . FATIGUE 11/21/2006  . ABNORMAL FINDINGS, ELEVATED BP W/O HTN 11/21/2006  . METRORRHAGIA 06/13/2006  . DISORDER, MENSTRUAL NEC 06/13/2006  . DIZZINESS 06/13/2006  . POLYCYSTIC OVARIAN DISEASE 04/25/2006  . AMENORRHEA, SECONDARY 04/20/2006  . ACNE, MILD 04/20/2006  . ABDOMINAL PAIN 04/20/2006    Past Surgical History:  Procedure Laterality Date  . CHOLECYSTECTOMY    . HERNIA REPAIR    . Ovarian Cyst Excision    . VARICOSE VEIN SURGERY      OB History    Gravida Para Term Preterm AB Living   0             SAB TAB Ectopic Multiple Live Births                   Home Medications    Prior to Admission medications   Medication Sig Start Date End Date Taking? Authorizing Provider  busPIRone (BUSPAR) 15 MG tablet Take 1 tablet (15 mg total) by mouth 3 (three) times daily. 11/04/16   Derrill Center, NP  cephALEXin (KEFLEX) 500 MG capsule Take 1 capsule (500 mg total) by mouth 3 (three) times daily. 02/03/17   Jola Schmidt, MD  chlorproMAZINE (THORAZINE) 50 MG tablet Take 1 tablet (50 mg total) by mouth 3 (three) times daily. 12/12/16   Aline August, MD  cloNIDine (CATAPRES) 0.1 MG tablet Take 1 tablet (0.1 mg  total) by mouth 2 (two) times daily. 11/04/16   Derrill Center, NP  cyclobenzaprine (FLEXERIL) 5 MG tablet Take 5 mg by mouth 2 (two) times daily.     [provider]  dicyclomine (BENTYL) 20 MG tablet Take 20 mg by mouth every 8 (eight) hours as needed for spasms.    [provider]  Dulaglutide (TRULICITY) 1.5 BP/1.0CH SOPN Inject 1.5 mg into the skin every Friday.    [provider]  fenofibrate (TRICOR) 145 MG tablet Take 145 mg daily by mouth.    [provider]  gabapentin (NEURONTIN) 400 MG capsule Take 1 capsule (400 mg total) by mouth 3 (three) times daily. 11/04/16   Derrill Center, NP  ibuprofen (ADVIL,MOTRIN) 800 MG tablet Take 800 mg by mouth  every 8 (eight) hours as needed for moderate pain.     [provider]  insulin aspart (NOVOLOG) 100 UNIT/ML injection Inject 22 Units into the skin 3 (three) times daily with meals as needed for high blood sugar.     [provider]  insulin detemir (LEVEMIR) 100 UNIT/ML injection Inject 0.68 mLs (68 Units total) into the skin 2 (two) times daily. 12/12/16   Aline August, MD  levETIRAcetam (KEPPRA) 500 MG tablet Take 1 tablet (500 mg total) by mouth 2 (two) times daily. 11/21/16   Sherwood Gambler, MD  lisinopril (PRINIVIL,ZESTRIL) 10 MG tablet Take 10 mg by mouth daily.     [provider]  metroNIDAZOLE (METROGEL VAGINAL) 0.75 % vaginal gel Place 1 Applicatorful vaginally 2 (two) times daily. Patient not taking: Reported on 01/31/2017 01/17/17   Antonietta Breach, PA-C  ondansetron (ZOFRAN ODT) 8 MG disintegrating tablet Take 1 tablet (8 mg total) by mouth every 8 (eight) hours as needed for nausea or vomiting. 02/03/17   Jola Schmidt, MD  pantoprazole (PROTONIX) 40 MG tablet Take 40 mg by mouth daily.    [provider]  QUEtiapine (SEROQUEL XR) 200 MG 24 hr tablet Take 500 mg by mouth at bedtime.    [provider]  QUEtiapine (SEROQUEL) 100 MG tablet Take 100 mg by mouth daily.    [provider]  sertraline (ZOLOFT) 100 MG tablet Take 2 tablets (200 mg total) by mouth every morning. 11/05/16   Derrill Center, NP  topiramate (TOPAMAX) 100 MG tablet Take 1 tablet (100 mg total) by mouth 2 (two) times daily. 11/04/16   Derrill Center, NP  traMADol (ULTRAM) 50 MG tablet Take 50 mg by mouth 2 (two) times daily as needed for moderate pain.     [provider]  traZODone (DESYREL) 50 MG tablet Take 1 tablet (50 mg total) by mouth at bedtime as needed for sleep. Patient taking differently: Take 25 mg by mouth 2 (two) times daily.  11/04/16   Derrill Center, NP    Family History Family History  Problem Relation Age of Onset  . Coronary  artery disease Maternal Grandmother   . Diabetes type II Maternal Grandmother   . Cancer Maternal Grandmother   . Hypertension Mother   . Hypertension Father     Social History Social History   Tobacco Use  . Smoking status: Never Smoker  . Smokeless tobacco: Never Used  Substance Use Topics  . Alcohol use: No  . Drug use: No     Allergies   Fish-derived products; Geodon [ziprasidone hcl]; Haldol [haloperidol lactate]; Buprenorphine hcl; Compazine [prochlorperazine]; Morphine and related; and Toradol [ketorolac tromethamine]   Review of Systems  Review of Systems  Unable to perform ROS: Psychiatric disorder     Physical Exam Updated Vital Signs BP (!) 140/97 (BP Location: Right Arm)   Pulse (!) 106   Temp 98.4 F (36.9 C) (Oral)   Resp 18   Ht 5\' 4"  (1.626 m)   Wt 104.3 kg (230 lb)   LMP 02/01/2017 Comment: HCG < 5 02-03-17  SpO2 99%   BMI 39.48 kg/m   Physical Exam  Constitutional: She appears well-developed and well-nourished. No distress.  Obese and malodorous  HENT:  Head: Normocephalic and atraumatic.  Right Ear: Hearing normal.  Left Ear: Hearing normal.  Nose: Nose normal.  Mouth/Throat: Oropharynx is clear and moist and mucous membranes are normal.  Eyes: Conjunctivae and EOM are normal. Pupils are equal, round, and reactive to light.  Neck: Normal range of motion. Neck supple.  Cardiovascular: Regular rhythm, S1 normal and S2 normal. Exam reveals no gallop and no friction rub.  No murmur heard. Pulmonary/Chest: Effort normal and breath sounds normal. No respiratory distress. She exhibits no tenderness.  Abdominal: Soft. Normal appearance and bowel sounds are normal. There is no hepatosplenomegaly. There is no tenderness. There is no rebound, no guarding, no tenderness at McBurney's point and negative Murphy's sign. No hernia.  Musculoskeletal: Normal range of motion.  Neurological: She is alert. She has normal strength. No cranial nerve deficit or  sensory deficit. Coordination normal. GCS eye subscore is 4. GCS verbal subscore is 5. GCS motor subscore is 6.  Skin: Skin is warm, dry and intact. No rash noted. No cyanosis.  Psychiatric: Her affect is angry and inappropriate. She is agitated and aggressive. She is noncommunicative.  Nursing note and vitals reviewed.    ED Treatments / Results  Labs (all labs ordered are listed, but only abnormal results are displayed) Labs Reviewed  COMPREHENSIVE METABOLIC PANEL  ETHANOL  SALICYLATE LEVEL  ACETAMINOPHEN LEVEL  CBC  RAPID URINE DRUG SCREEN, HOSP PERFORMED  I-STAT BETA HCG BLOOD, ED ([facility], [facility], [facility] ONLY)    EKG  EKG Interpretation None       Radiology No results found.  Procedures Procedures (including critical care time)  Medications Ordered in ED Medications  OLANZapine zydis (ZYPREXA) disintegrating tablet 10 mg (not administered)    And  LORazepam (ATIVAN) tablet 1 mg (not administered)  ziprasidone (GEODON) injection 10 mg (not administered)  benztropine mesylate (COGENTIN) injection 2 mg (not administered)     Initial Impression / Assessment and Plan / ED Course  I have reviewed the triage vital signs and the nursing notes.  Pertinent labs & imaging results that were available during my care of the patient were reviewed by me and considered in my medical decision making (see chart for details).     Patient presented for evaluation after overdose.  Patient had taken an unknown quantity of her prescription medications earlier today.  This occurred 7 or 8 hours ago.  She is not showing any signs of sedation or intoxication from the medication.  She therefore does not require any medical intervention secondary to the overdose. she did fall asleep after the overdose but then woke up and became distraught that she was unsuccessful in her suicide attempt.  Here in the ER she has been extremely uncooperative.  She has repeatedly attempted to run out of the ER away from  police and treating personnel.  IVC was initiated.  She will require psychiatric evaluation and treatment.  Patient medically clear for psychiatric treatment.  Final Clinical  Impressions(s) / ED Diagnoses   Final diagnoses:  Intentional drug overdose, initial encounter [facility])    ED Discharge Orders    None       Orpah Greek, MD 02/06/17 [phone]

## 2017-02-06 NOTE — Progress Notes (Signed)
Patient is 32 yrs old, has been to [facility] previously.  Involuntary.  Patient called SI hotline, threatened to OD on her medications.  Hx bipolar, schizophrenia, depression, anxiety, HTN, diabetes, cancer.  Patient was given geodon, ativan, cogentin at 0500 this morning, and at 0730 was given geodon and ativan IM.  Patient "acted out" at [facility] ED, banging on sharps container, threatened to hit staff, refused all lab.  When patient arrived at [facility], patient was sitting in wheelchair asleep, would not answer questions.  Staff members were standing on each side of patient, holding her up so skin assessment could be completed.  Patient has healed surgical scar from umbilicus to pubic ara.  Patient was brought to her room, put in her bed, and slept several hours.  When patient awoke, admission process was started.  Patient stated she does have SI thoughts, contracts for safety.  Denied tobacco, alcohol and all drug use.  Stated she goes to [facility] for medications and they transport her to appointments.  SI, contracts for safety.  Sees "Otila Kluver who tells her bad things, to hurt herself."  Denied HI.  Rated depression, anxiety and hopoeless 8.  Stated she did take kitchen knife and made cut on L wrist, superficial.  Did take some medications, went to sleep, woke up and called ambulance.  Lives by herself in apartment in [place].  Patient stated she receives medicaid and medicare.

## 2017-02-06 NOTE — ED Notes (Addendum)
Pt removed scrub top. Refusing to put it back on, stating that "it's hot". Multiple attempts by staff and pt continues to refuse to put shirt back on.

## 2017-02-06 NOTE — ED Notes (Signed)
After being placed in D hallway bed, pt got up and walked into Pod C bathroom, locking door behind.

## 2017-02-06 NOTE — ED Notes (Signed)
Pt valuable placed with Security. Valuable envelope [id number].

## 2017-02-07 DIAGNOSIS — G4700 Insomnia, unspecified: Secondary | ICD-10-CM

## 2017-02-07 DIAGNOSIS — E119 Type 2 diabetes mellitus without complications: Secondary | ICD-10-CM

## 2017-02-07 LAB — GLUCOSE, CAPILLARY
GLUCOSE-CAPILLARY: 382 mg/dL — AB (ref 65–99)
GLUCOSE-CAPILLARY: 296 mg/dL — AB (ref 65–99)
Glucose-Capillary: 354 mg/dL — ABNORMAL HIGH (ref 65–99)

## 2017-02-07 MED ORDER — CEPHALEXIN 500 MG PO CAPS
500.0000 mg | ORAL_CAPSULE | Freq: Three times a day (TID) | ORAL | Status: DC
  Administered 2017-02-07 – 2017-02-09 (×6): 500 mg via ORAL
  Filled 2017-02-07 (×9): qty 1

## 2017-02-07 MED ORDER — INSULIN DETEMIR 100 UNIT/ML ~~LOC~~ SOLN
68.0000 [IU] | Freq: Two times a day (BID) | SUBCUTANEOUS | Status: DC
  Administered 2017-02-07 – 2017-02-08 (×3): 68 [IU] via SUBCUTANEOUS

## 2017-02-07 MED ORDER — DULAGLUTIDE 1.5 MG/0.5ML ~~LOC~~ SOAJ: 1.5000 mg | SUBCUTANEOUS | Status: DC

## 2017-02-07 MED ORDER — GABAPENTIN 400 MG PO CAPS
400.0000 mg | ORAL_CAPSULE | Freq: Three times a day (TID) | ORAL | Status: DC
  Administered 2017-02-07 – 2017-02-09 (×6): 400 mg via ORAL
  Filled 2017-02-07 (×12): qty 1

## 2017-02-07 MED ORDER — HYDROXYZINE HCL 50 MG PO TABS: 50.0000 mg | ORAL_TABLET | Freq: Three times a day (TID) | ORAL | Status: DC | PRN

## 2017-02-07 MED ORDER — LEVETIRACETAM 500 MG PO TABS
500.0000 mg | ORAL_TABLET | Freq: Two times a day (BID) | ORAL | Status: DC
  Administered 2017-02-07 – 2017-02-09 (×4): 500 mg via ORAL
  Filled 2017-02-07 (×8): qty 1

## 2017-02-07 MED ORDER — CHLORPROMAZINE HCL 50 MG PO TABS
50.0000 mg | ORAL_TABLET | Freq: Three times a day (TID) | ORAL | Status: DC
  Administered 2017-02-07 – 2017-02-09 (×6): 50 mg via ORAL
  Filled 2017-02-07 (×9): qty 1
  Filled 2017-02-07: qty 2
  Filled 2017-02-07 (×2): qty 1

## 2017-02-07 MED ORDER — QUETIAPINE FUMARATE 50 MG PO TABS
50.0000 mg | ORAL_TABLET | Freq: Two times a day (BID) | ORAL | Status: DC
  Administered 2017-02-07 – 2017-02-09 (×5): 50 mg via ORAL
  Filled 2017-02-07 (×9): qty 1

## 2017-02-07 MED ORDER — DIPHENHYDRAMINE HCL 50 MG/ML IJ SOLN
INTRAMUSCULAR | Status: AC
  Administered 2017-02-07: 50 mg via INTRAMUSCULAR
  Filled 2017-02-07: qty 1

## 2017-02-07 MED ORDER — FENOFIBRATE 160 MG PO TABS
160.0000 mg | ORAL_TABLET | Freq: Every day | ORAL | Status: DC
  Administered 2017-02-07 – 2017-02-09 (×3): 160 mg via ORAL
  Filled 2017-02-07 (×6): qty 1

## 2017-02-07 MED ORDER — LORAZEPAM 1 MG PO TABS
1.0000 mg | ORAL_TABLET | ORAL | Status: AC
  Administered 2017-02-07: 1 mg via ORAL

## 2017-02-07 MED ORDER — DICYCLOMINE HCL 20 MG PO TABS: 20.0000 mg | ORAL_TABLET | Freq: Three times a day (TID) | ORAL | Status: DC | PRN

## 2017-02-07 MED ORDER — INSULIN ASPART 100 UNIT/ML ~~LOC~~ SOLN
22.0000 [IU] | Freq: Three times a day (TID) | SUBCUTANEOUS | Status: DC | PRN
  Administered 2017-02-08 (×3): 22 [IU] via SUBCUTANEOUS
  Filled 2017-02-07 (×3): qty 0.22

## 2017-02-07 MED ORDER — QUETIAPINE FUMARATE 400 MG PO TABS
500.0000 mg | ORAL_TABLET | Freq: Every day | ORAL | Status: DC
  Administered 2017-02-07 – 2017-02-08 (×2): 500 mg via ORAL
  Filled 2017-02-07 (×4): qty 1

## 2017-02-07 MED ORDER — DIPHENHYDRAMINE HCL 50 MG/ML IJ SOLN: 50.0000 mg | Freq: Once | INTRAMUSCULAR | Status: DC

## 2017-02-07 MED ORDER — CLONIDINE HCL 0.1 MG PO TABS
0.1000 mg | ORAL_TABLET | Freq: Two times a day (BID) | ORAL | Status: DC
  Administered 2017-02-07 – 2017-02-09 (×4): 0.1 mg via ORAL
  Filled 2017-02-07 (×8): qty 1

## 2017-02-07 MED ORDER — CHLORPROMAZINE HCL 25 MG/ML IJ SOLN
50.0000 mg | INTRAMUSCULAR | Status: DC | PRN
  Administered 2017-02-07 – 2017-02-08 (×4): 50 mg via INTRAMUSCULAR
  Filled 2017-02-07 (×4): qty 2

## 2017-02-07 MED ORDER — LISINOPRIL 10 MG PO TABS
10.0000 mg | ORAL_TABLET | Freq: Every day | ORAL | Status: DC
  Administered 2017-02-07 – 2017-02-09 (×3): 10 mg via ORAL
  Filled 2017-02-07 (×2): qty 1
  Filled 2017-02-07: qty 2
  Filled 2017-02-07 (×3): qty 1

## 2017-02-07 MED ORDER — PANTOPRAZOLE SODIUM 40 MG PO TBEC
40.0000 mg | DELAYED_RELEASE_TABLET | Freq: Every day | ORAL | Status: DC
  Administered 2017-02-07 – 2017-02-09 (×3): 40 mg via ORAL
  Filled 2017-02-07 (×6): qty 1

## 2017-02-07 NOTE — Progress Notes (Addendum)
Pt woke up and requested for snacks and bedtime medications. Snacks, fluids, and PRN meds given to Pt as requested. Pt states "This is not all I take. If I don't get all my meds, I am going to be angry". Provider on call notified. See MAR. Pt denies HI/AVH/Pain at this time. Pt is passive for SI; Pt cannot verbally contract with Probation officer. Per 1:1 sitter; Pt was pulling plastic off spoon and attempting to swallow plastic and covering nose with plastic. Pt was verbally redirected and plastic removed. Pt presents as childlike with attention behaviors. Pt required frequent redirection. Pt states she wants to speak to MD tomorrow regarding medication regimen.

## 2017-02-07 NOTE — Tx Team (Signed)
Interdisciplinary Treatment and Diagnostic Plan Update  02/07/2017 Time of Session: 9:22 AM  Tina Saunders MRN: [id number]  Principal Diagnosis: Schizoaffective disorder, bipolar type [facility] )  Secondary Diagnoses: Principal Problem:   Schizoaffective disorder, bipolar type ([place]) Active Problems:   Cluster B personality disorder ([place])   Current Medications:  Current Facility-Administered Medications  Medication Dose Route Frequency Provider Last Rate Last Dose  . acetaminophen (TYLENOL) tablet 650 mg  650 mg Oral Q6H PRN Okonkwo, Justina A, NP      . alum & mag hydroxide-simeth (MAALOX/MYLANTA) 200-200-20 MG/5ML suspension 30 mL  30 mL Oral Q4H PRN Okonkwo, Justina A, NP      . chlorproMAZINE (THORAZINE) injection 50 mg  50 mg Intramuscular Q2H PRN Pennelope Bracken, MD      . diphenhydrAMINE (BENADRYL) injection 50 mg  50 mg Intramuscular Once Patriciaann Clan E, PA-C      . hydrOXYzine (ATARAX/VISTARIL) tablet 25 mg  25 mg Oral TID PRN Lu Duffel, Justina A, NP   25 mg at 02/06/17 2108  . OLANZapine zydis (ZYPREXA) disintegrating tablet 10 mg  10 mg Oral Q8H PRN Pennelope Bracken, MD       And  . LORazepam (ATIVAN) tablet 1 mg  1 mg Oral PRN Pennelope Bracken, MD      . magnesium hydroxide (MILK OF MAGNESIA) suspension 30 mL  30 mL Oral Daily PRN Lu Duffel, Justina A, NP      . QUEtiapine (SEROQUEL) tablet 200 mg  200 mg Oral QHS Patriciaann Clan E, PA-C   200 mg at 02/06/17 2202  . traZODone (DESYREL) tablet 50 mg  50 mg Oral QHS PRN Lu Duffel, Justina A, NP   50 mg at 02/06/17 2108    PTA Medications: Medications Prior to Admission  Medication Sig Dispense Refill Last Dose  . busPIRone (BUSPAR) 15 MG tablet Take 1 tablet (15 mg total) by mouth 3 (three) times daily. 90 tablet 0 02/04/2017 at Unknown time  . cephALEXin (KEFLEX) 500 MG capsule Take 1 capsule (500 mg total) by mouth 3 (three) times daily. 21 capsule 0 Past Week at Unknown time  . chlorproMAZINE (THORAZINE)  50 MG tablet Take 1 tablet (50 mg total) by mouth 3 (three) times daily.   02/04/2017 at Unknown time  . cloNIDine (CATAPRES) 0.1 MG tablet Take 1 tablet (0.1 mg total) by mouth 2 (two) times daily. 60 tablet 11 02/04/2017 at Unknown time  . cyclobenzaprine (FLEXERIL) 5 MG tablet Take 5 mg by mouth 2 (two) times daily.    02/04/2017 at Unknown time  . dicyclomine (BENTYL) 20 MG tablet Take 20 mg by mouth every 8 (eight) hours as needed for spasms.   Past Week at Unknown time  . Dulaglutide (TRULICITY) 1.5 EG/3.1DV SOPN Inject 1.5 mg into the skin every Friday.   02/02/2017  . fenofibrate (TRICOR) 145 MG tablet Take 145 mg daily by mouth.   Past Week at Unknown time  . gabapentin (NEURONTIN) 400 MG capsule Take 1 capsule (400 mg total) by mouth 3 (three) times daily. 90 capsule 0 02/04/2017 at Unknown time  . ibuprofen (ADVIL,MOTRIN) 800 MG tablet Take 800 mg by mouth every 8 (eight) hours as needed for moderate pain.    Past Month at Unknown time  . insulin aspart (NOVOLOG) 100 UNIT/ML injection Inject 22 Units into the skin 3 (three) times daily with meals as needed for high blood sugar.    02/04/2017 at Unknown time  . insulin detemir (LEVEMIR) 100 UNIT/ML injection  Inject 0.68 mLs (68 Units total) into the skin 2 (two) times daily.   02/04/2017 at Unknown time  . levETIRAcetam (KEPPRA) 500 MG tablet Take 1 tablet (500 mg total) by mouth 2 (two) times daily. 60 tablet 0 02/04/2017 at Unknown time  . lisinopril (PRINIVIL,ZESTRIL) 10 MG tablet Take 10 mg by mouth daily.    02/04/2017 at Unknown time  . metroNIDAZOLE (METROGEL VAGINAL) 0.75 % vaginal gel Place 1 Applicatorful vaginally 2 (two) times daily. (Patient not taking: Reported on 01/31/2017) 70 g 0 Completed Course at Unknown time  . ondansetron (ZOFRAN ODT) 8 MG disintegrating tablet Take 1 tablet (8 mg total) by mouth every 8 (eight) hours as needed for nausea or vomiting. 10 tablet 0 Past Week at Unknown time  . pantoprazole (PROTONIX) 40 MG tablet  Take 40 mg by mouth daily.   02/04/2017 at Unknown time  . QUEtiapine (SEROQUEL XR) 200 MG 24 hr tablet Take 500 mg by mouth at bedtime.   Past Week at Unknown time  . QUEtiapine (SEROQUEL) 100 MG tablet Take 100 mg by mouth daily.   02/04/2017 at Unknown time  . sertraline (ZOLOFT) 100 MG tablet Take 2 tablets (200 mg total) by mouth every morning. 60 tablet 0 02/04/2017 at Unknown time  . topiramate (TOPAMAX) 100 MG tablet Take 1 tablet (100 mg total) by mouth 2 (two) times daily. 60 tablet 0 02/04/2017 at Unknown time  . traMADol (ULTRAM) 50 MG tablet Take 50 mg by mouth 2 (two) times daily as needed for moderate pain.    Past Week at Unknown time  . traZODone (DESYREL) 50 MG tablet Take 1 tablet (50 mg total) by mouth at bedtime as needed for sleep. (Patient taking differently: Take 25 mg by mouth 2 (two) times daily. ) 30 tablet 0 02/04/2017 at Unknown time    Patient Stressors:    Patient Strengths:    Treatment Modalities: Medication Management, Group therapy, Case management,  1 to 1 session with clinician, Psychoeducation, Recreational therapy.   Physician Treatment Plan for Primary Diagnosis: Schizoaffective disorder, bipolar type ([place]) Long Term Goal(s): Improvement in symptoms so as ready for discharge  Short Term Goals: Compliance with prescribed medications will improve Ability to identify triggers associated with substance abuse/mental health issues will improve  Medication Management: Evaluate patient's response, side effects, and tolerance of medication regimen.  Therapeutic Interventions: 1 to 1 sessions, Unit Group sessions and Medication administration.  Evaluation of Outcomes: Progressing  Physician Treatment Plan for Secondary Diagnosis: Principal Problem:   Schizoaffective disorder, bipolar type ([place]) Active Problems:   Cluster B personality disorder ([place])   Long Term Goal(s): Improvement in symptoms so as ready for discharge  Short Term Goals: Compliance with  prescribed medications will improve Ability to identify triggers associated with substance abuse/mental health issues will improve  Medication Management: Evaluate patient's response, side effects, and tolerance of medication regimen.  Therapeutic Interventions: 1 to 1 sessions, Unit Group sessions and Medication administration.  Evaluation of Outcomes: Progressing   RN Treatment Plan for Primary Diagnosis: Schizoaffective disorder, bipolar type ([place]) Long Term Goal(s): Knowledge of disease and therapeutic regimen to maintain health will improve  Short Term Goals: Ability to identify and develop effective coping behaviors will improve and Compliance with prescribed medications will improve  Medication Management: RN will administer medications as ordered by provider, will assess and evaluate patient's response and provide education to patient for prescribed medication. RN will report any adverse and/or side effects to prescribing provider.  Therapeutic  Interventions: 1 on 1 counseling sessions, Psychoeducation, Medication administration, Evaluate responses to treatment, Monitor vital signs and CBGs as ordered, Perform/monitor CIWA, COWS, AIMS and Fall Risk screenings as ordered, Perform wound care treatments as ordered.  Evaluation of Outcomes: Progressing   LCSW Treatment Plan for Primary Diagnosis: Schizoaffective disorder, bipolar type ([place]) Long Term Goal(s): Safe transition to appropriate next level of care at discharge, Engage patient in therapeutic group addressing interpersonal concerns.  Short Term Goals: Engage patient in aftercare planning with referrals and resources  Therapeutic Interventions: Assess for all discharge needs, 1 to 1 time with Social worker, Explore available resources and support systems, Assess for adequacy in community support network, Educate family and significant other(s) on suicide prevention, Complete Psychosocial Assessment, Interpersonal group  therapy.  Evaluation of Outcomes: Met  Return home, follow up [facility]   Progress in Treatment: Attending groups: No Participating in groups: No Taking medication as prescribed: Yes Toleration medication: Yes, no side effects reported at this time Family/Significant other contact made: Yes ACT team Patient understands diagnosis: No Limited insight Discussing patient identified problems/goals with staff: Yes Medical problems stabilized or resolved: Yes Denies suicidal/homicidal ideation: Yes Issues/concerns per patient self-inventory: None Other: N/A  New problem(s) identified: None identified at this time.   New Short Term/Long Term Goal(s): None identified at this time.   Discharge Plan or Barriers: "To get my medication right."  Reason for Continuation of Hospitalization: Mood Instability Depression  Medication stabilization Suicidal ideation   Estimated Length of Stay: 1/21  Attendees: Patient: Tina Saunders 02/07/2017  9:22 AM  Physician: Maris Berger, MD 02/07/2017  9:22 AM  Nursing: Sena Hitch, RN 02/07/2017  9:22 AM  RN Care Manager: Lars Pinks, RN 02/07/2017  9:22 AM  Social Worker: Ripley Fraise 02/07/2017  9:22 AM  Recreational Therapist: Winfield Cunas 02/07/2017  9:22 AM  Other: Norberto Sorenson 02/07/2017  9:22 AM  Other:  02/07/2017  9:22 AM    Scribe for Treatment Team:  Roque Lias LCSW 02/07/2017 9:22 AM

## 2017-02-07 NOTE — Progress Notes (Addendum)
Nursing 1:1 Note @ 1100  D: Patient is observed in bed lying down communicating with social worker at this time. Affect/mood: flat, preoccupied with eloping however was redirected and has been appropriate ever since. Patient arose from bed this morning, initially standing in front of 500 hall doors refusing to move. Patient reports that these were the doors closest to the exit, expressing that she wants to leave. Patient exited 500 hall doors when they were opened but was eventually redirected with the assistance of other staff members. Patient also consented to Ativan and Thorazine administration to assist with feelings of anxiety and behaviors (banging on the wall). Patient requested to take a bath and agreed that she would not attempt to elope if request is granted. Patient took a bath without issue, and at this time remains in bed. Respirations even and unlabored. No signs of distress or other immediate concerns noted. Patient does not answer when asked about SI, HI, AVH this morning. Denies pain. Patient was unable to identify a goal when asked. Notified this Probation officer that she takes scheduled insulins at home for maintenance of blood sugars. CBG obtained (382). MD notified.  A: Medications administered to patient per MD order. Support and encouragement provided. Routine safety checks conducted every 15 minutes. Patient informed to notify staff with problems or concerns. 1:1 observation continues for safety. Sitter is present and within reach of patient.   R: At this time patient is unable to contract for safety at this time. Patient remains safe.

## 2017-02-07 NOTE — Progress Notes (Signed)
Recreation Therapy Notes  INPATIENT RECREATION THERAPY ASSESSMENT  Patient Details Name: Tina Saunders MRN: [id number] DOB: 08-Jan-1986 Today's Date: 02/07/2017  Patient Stressors: Other (Comment)(The voices)  Pt stated she was here because of a suicide attempt.  Coping Skills:   Isolate, Avoidance, Self-Injury, Art/Dance  Personal Challenges: Anger, Communication, Concentration, Decision-Making, Expressing Yourself, Problem-Solving, Self-Esteem/Confidence, Social Interaction, Stress Management, Trusting Others  Leisure Interests (2+):  Individual - Musician Resources:  No  Patient Strengths:  None  Patient Identified Areas of Improvement:  "I don't know"  Current Recreation Participation:  Everyday  Patient Goal for Hospitalization:  "Get better"  [place] of Residence:  [place] of Residence:  [place]  Current SI (including self-harm):  Yes(Rated and 10; Pt couldn't contract for safety.  Pt is on 1:1)  Current HI:  No  Consent to Intern Participation: N/A   Victorino Sparrow, LRT/CTRS  Ria Comment, Noeh Sparacino A 02/07/2017, 1:01 PM

## 2017-02-07 NOTE — Progress Notes (Signed)
Nursing 1:1 Note @ 0700 Next Note @ 1100  D: Pt observed sleeping in bed with eyes closed. RR even and unlabored. No distress noted.  A: 1:1 observation continues for safety. Sitter within arms reach.  R: Pt remains safe.

## 2017-02-07 NOTE — Progress Notes (Signed)
1:1 Note  Patient is currently in room lying down with eyes closed. Respirations even and unlabored. No signs of distress or pain observed at this time. No additional concerns noted. 1:1 sitter present at bedside. 1:1 observation maintained at this time for patient safety.

## 2017-02-07 NOTE — Progress Notes (Signed)
Nursing 1:1 Note @ 0300 Next Note @ 0700  D: Pt is currently agitated/irritable/anxious in affect/mood. Pt observed standing and banging on double doors/walls loudly.Pt is refusing to move despite multiple requests from staff. Pt is at risk for elopement and is disruptive to milieu.Pt presents with limited insight. Pt states "No, I am not going to move. I want to leave now". Provider on call, Anibal Henderson., and Charge Nurse notified. See MAR. Pt took injections without requiring manual hold. Thereafter, Pt request for sandwich/fluids. Both given to Pt. Pt proceeded to go back to bed. Will continue to monitor.   A: 1:1 observation continues for safety. Sitter within arms reach.  R: Pt remains safe.

## 2017-02-07 NOTE — Progress Notes (Signed)
Recreation Therapy Notes  Date: 02/07/17 Time: 1000 Location: 500 Hall Dayroom  Group Topic: Self-Esteem  Goal Area(s) Addresses:  Patient will successfully identify positive attributes about themselves.  Patient will successfully identify benefit of improved self-esteem.   Intervention: Blank crest, markers  Activity: Crest of Arms.  Patients were given a blank crest divided into four boxes.  Patients were to highlight people, events, important dates or accomplishments that helped to shape the person they have become.  Education: Self-Esteem, Dentist.   Education Outcome: Acknowledges education/In group clarification offered/Needs additional education  Clinical Observations/Feedback: Pt did not attend group.    Victorino Sparrow, LRT/CTRS         Victorino Sparrow A 02/07/2017 12:13 PM

## 2017-02-07 NOTE — ED Notes (Addendum)
Pt again attempted to leave room and return home. Sitter at bedside, but pt at doorway and started walking down hallway, refusing to listen to this RN or sitter. Security contacted. MD aware for additional orders.

## 2017-02-07 NOTE — Progress Notes (Signed)
1:1 Note:  Patient is safe and in room at this time. Patient is awake and sitting on bed. Requests sheets and blanket, provided to patient. No signs of pain or distress noted. No signs of labored breathing. Chest expansion symmetrical. Scheduled medications administered without issue. Has been observed out of room and using the phone throughout the day with sitter present. Sitter remains present and within reach of patient. 1:1 observation maintained for safety purposes. Will continue to monitor.

## 2017-02-07 NOTE — Plan of Care (Signed)
Patient has been compliant with all scheduled medications. Takes without issue and verbalizes no concerns related to medication regimen at this time.

## 2017-02-07 NOTE — Progress Notes (Signed)
[facility] MD Progress Note  02/07/2017 1:29 PM Tina Saunders  MRN:  [id number] Subjective:    Tina Saunders is a 32 y/o F with history of schizoaffective disorder bipolar type and Cluster B personality disorder who was admitted on IVC after she had a suicide attempt via overdose. Pt was brought in by ambulance after she called 911 and reported that she had ingested a handful of pills in a suicide attempt. When emergency services arrived on the scene, pt was agitated and told EMS staff she was upset that her suicide attempt was not successful. In the ED she remained agitated and disorganized; she attempted to leave multiple times. Pt was placed on IVC and transferred to [facility] for additional treatment and stabilization. At initial evaluation, she remained too drowsy to participate in the interview, so she was just initially supported with PRN medications for agitation. Overnight, she became agitated and demanded to leave so that she could go home and kill herself; she required IM geodon to control her agitation. She was placed on 1:1 observation due to her inability to contract for safety on the unit.  This morning pt remained on 1:1 observation due to her behaviors overnight. Pt was banging on the doors to be let off the unit, and she attempted to elope multiple times - once making it past the doors. During her attempt to elope, pt made several statements of "Let me go home so I can go kill myself."  However, pt was able to be redirected by staff and she accepted PRN injection of thorazine.  Upon interview today, pt starts interview by stating, "Let me go home so I can kill myself." She continues to endorse SI with plan to overdose, and she expresses remorse that her attempt was not successful. Pt denies HI. She is irritable and short with her responses, and she does not want to talk about her recent stressors and triggers for suicide attempt. She endorses AH and VH of "a man named [name] who tells me to 'do it'."  Pt feels that she did not sleep well overnight and requests for medication to help her sleep.  Collateral information was provided by pt's mother to SW team, and she reported that pt had recently moved back to living independently, and her mother was concerned about pt sleeping throughout the day.  Discussed with patient about treatment options. She feels that she was doing better overall when she was on a higher dose of seroquel. We discussed resuming her previous medications to which she had good adherence including: thorazine 50mg  TID, gabapentin 400mg  TID, zoloft 200mg  qDay, topamax 50mg  BID, and trazodone 25mg  BID. Discussed with patient about splitting dose of seroquel in the AM to also include an afternoon dose, and pt was in agreement. She had no further questions, comments, or concerns.  Principal Problem: Schizoaffective disorder, bipolar type ([place]) Diagnosis:   Patient Active Problem List   Diagnosis Date Noted  . Seizures ([place]) [R56.9] 12/11/2016  . Acute lower UTI [N39.0] 12/11/2016  . DM2 (diabetes mellitus, type 2) ([place]) [E11.9] 12/11/2016  . Uncontrolled diabetes mellitus ([place]) [E11.65] 11/03/2016  . Schizoaffective disorder, bipolar type ([place]) [F25.0] 11/02/2016  . Cluster B personality disorder ([place]) [F60.9] 11/02/2016  . Schizoaffective disorder, mixed type ([place]) [F25.0] 05/30/2014  . Suicidal ideation [R45.851]   . 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 Headache [R51] 04/15/2013  . HTN (hypertension) [I10] 04/15/2013  . Post traumatic stress disorder [F43.10] 12/07/2011  . CAP (community acquired  pneumonia) [J18.9] 08/27/2011  . Chest pain [R07.9] 08/26/2011  . SOB (shortness of breath) [R06.02] 08/26/2011  . Fever [R50.9] 08/26/2011  . Hypokalemia [E87.6] 08/26/2011  . PSVT (paroxysmal supraventricular tachycardia) ([place]) [I47.1] 08/26/2011  . ADHD [F90.9] 09/23/2007  . EPIGASTRIC PAIN [R10.13] 09/23/2007  . Obesity, unspecified [E66.9] 07/30/2007  .  DEPRESSION [F32.9] 07/30/2007  . SLEEP DISORDER [G47.9] 07/30/2007  . IMPAIRED FASTING GLUCOSE [R73.01] 07/30/2007  . FATIGUE [R53.81, R53.83] 11/21/2006  . ABNORMAL FINDINGS, ELEVATED BP W/O HTN [R03.0] 11/21/2006  . METRORRHAGIA [N92.1] 06/13/2006  . DISORDER, MENSTRUAL NEC [N94.9] 06/13/2006  . DIZZINESS [R42] 06/13/2006  . POLYCYSTIC OVARIAN DISEASE [E28.2] 04/25/2006  . AMENORRHEA, SECONDARY [N91.2] 04/20/2006  . ACNE, MILD [L70.8] 04/20/2006  . ABDOMINAL PAIN [R10.9] 04/20/2006   Total Time spent with patient: 30 minutes  Past Psychiatric History: see H&P  Past Medical History:  Past Medical History:  Diagnosis Date  . Anxiety   . Bipolar 1 disorder ([place])   . Cancer of abdominal wall   . Depression   . Diabetes mellitus without complication ([place])   . Hypertension   . Obesity   . Obesity   . Polycystic ovarian syndrome 07/01/2011   Patient report  . 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 Schizophrenia [facility])     Past Surgical History:  Procedure Laterality Date  . CHOLECYSTECTOMY    . HERNIA REPAIR    . Ovarian Cyst Excision    . VARICOSE VEIN SURGERY     Family History:  Family History  Problem Relation Age of Onset  . Coronary artery disease Maternal Grandmother   . Diabetes type II Maternal Grandmother   . Cancer Maternal Grandmother   . Hypertension Mother   . Hypertension Father    Family Psychiatric  History: see H&P Social History:  Social History   Substance and Sexual Activity  Alcohol Use No     Social History   Substance and Sexual Activity  Drug Use No    Social History   Socioeconomic History  . Marital status: Single    Spouse name: None  . Number of children: 0  . Years of education: None  . Highest education level: None  Social Needs  . Financial resource strain: None  . Food insecurity - worry: None  . Food insecurity - inability: None  . Transportation needs - medical: None  . Transportation needs - non-medical: None  Occupational  History  . None  Tobacco Use  . Smoking status: Never Smoker  . Smokeless tobacco: Never Used  Substance and Sexual Activity  . Alcohol use: No  . Drug use: No  . Sexual activity: No    Birth control/protection: None  Other Topics Concern  . None  Social History Narrative  . None   Additional Social History:                         Sleep: Fair  Appetite:  Fair  Current Medications: Current Facility-Administered Medications  Medication Dose Route Frequency Provider Last Rate Last Dose  . acetaminophen (TYLENOL) tablet 650 mg  650 mg Oral Q6H PRN Okonkwo, Justina A, NP      . alum & mag hydroxide-simeth (MAALOX/MYLANTA) 200-200-20 MG/5ML suspension 30 mL  30 mL Oral Q4H PRN Okonkwo, Justina A, NP      . chlorproMAZINE (THORAZINE) injection 50 mg  50 mg Intramuscular Q2H PRN Pennelope Bracken, MD   50 mg at 02/07/17 0939  . diphenhydrAMINE (BENADRYL)  injection 50 mg  50 mg Intramuscular Once Laverle Hobby, PA-C      . hydrOXYzine (ATARAX/VISTARIL) tablet 25 mg  25 mg Oral TID PRN Hughie Closs A, NP   25 mg at 02/06/17 2108  . OLANZapine zydis (ZYPREXA) disintegrating tablet 10 mg  10 mg Oral Q8H PRN Pennelope Bracken, MD       And  . LORazepam (ATIVAN) tablet 1 mg  1 mg Oral PRN Pennelope Bracken, MD      . magnesium hydroxide (MILK OF MAGNESIA) suspension 30 mL  30 mL Oral Daily PRN Lu Duffel, Justina A, NP      . QUEtiapine (SEROQUEL) tablet 200 mg  200 mg Oral QHS Patriciaann Clan E, PA-C   200 mg at 02/06/17 2202  . traZODone (DESYREL) tablet 50 mg  50 mg Oral QHS PRN Lu Duffel, Justina A, NP   50 mg at 02/06/17 2108    Lab Results:  Results for orders placed or performed during the hospital encounter of 02/06/17 (from the past 48 hour(s))  Glucose, capillary     Status: Abnormal   Collection Time: 02/07/17  8:52 AM  Result Value Ref Range   Glucose-Capillary 382 (H) 65 - 99 mg/dL    Blood Alcohol level:  Lab Results  Component Value  Date   ETH <10 10/31/2016   ETH <5 56/43/3295    Metabolic Disorder Labs: Lab Results  Component Value Date   HGBA1C 10.9 (H) 11/03/2016   MPG 266.13 11/03/2016   Lab Results  Component Value Date   PROLACTIN 63.7 (H) 11/03/2016   PROLACTIN 10.9 06/13/2006   Lab Results  Component Value Date   CHOL 113 11/03/2016   TRIG 366 (H) 11/03/2016   HDL 19 (L) 11/03/2016   CHOLHDL 5.9 11/03/2016   VLDL 73 (H) 11/03/2016   LDLCALC 21 11/03/2016   LDLCALC 42 04/26/2006    Physical Findings: AIMS: Facial and Oral Movements Muscles of Facial Expression: None, normal Lips and Perioral Area: None, normal Jaw: None, normal Tongue: None, normal,Extremity Movements Upper (arms, wrists, hands, fingers): None, normal Lower (legs, knees, ankles, toes): None, normal, Trunk Movements Neck, shoulders, hips: None, normal, Overall Severity Severity of abnormal movements (highest score from questions above): None, normal Incapacitation due to abnormal movements: None, normal Patient's awareness of abnormal movements (rate only patient's report): No Awareness, Dental Status Current problems with teeth and/or dentures?: No Does patient usually wear dentures?: No  CIWA:  CIWA-Ar Total: 1 COWS:  COWS Total Score: 2  Musculoskeletal: Strength & Muscle Tone: within normal limits Gait & Station: normal Patient leans: N/A  Psychiatric Specialty Exam: Physical Exam  Nursing note and vitals reviewed.   Review of Systems  Constitutional: Positive for malaise/fatigue. Negative for chills and fever.  Respiratory: Negative for cough and shortness of breath.   Gastrointestinal: Negative for abdominal pain, heartburn, nausea and vomiting.  Psychiatric/Behavioral: Positive for depression, hallucinations and suicidal ideas. The patient is nervous/anxious.     Blood pressure 133/86, pulse (!) 134, temperature 98.2 F (36.8 C), temperature source Oral, resp. rate 18, height 5\' 4"  (1.626 m), weight 105.7  kg (233 lb), last menstrual period 02/01/2017, SpO2 96 %.Body mass index is 39.99 kg/m.  General Appearance: Casual and Disheveled  Eye Contact:  Fair  Speech:  Clear and Coherent and Normal Rate  Volume:  Normal  Mood:  Angry, Dysphoric and Irritable  Affect:  Blunt, Congruent and Labile  Thought Process:  Coherent, Goal Directed and Descriptions of Associations:  Loose  Orientation:  Full (Time, Place, and Person)  Thought Content:  Hallucinations: Auditory Visual  Suicidal Thoughts:  Yes.  with intent/plan  Homicidal Thoughts:  No  Memory:  Immediate;   Fair Recent;   Fair Remote;   Fair  Judgement:  Impaired  Insight:  Lacking  Psychomotor Activity:  Normal  Concentration:  Concentration: Fair  Recall:  [employer] of Knowledge:  Fair  Language:  Fair  Akathisia:  No  Handed:    AIMS (if indicated):     Assets:  Communication Skills  ADL's:  Intact  Cognition:  WNL  Sleep:  Number of Hours: 2     Treatment Plan Summary: Daily contact with patient to assess and evaluate symptoms and progress in treatment and Medication management. Pt remains agitated, irritable, endorsing SI/AH/VH, and she requires 1:1 due to inability to contract for safety on the unit. She will be resumed on home medications at previous doses with change to seroquel by increasing evening dose and splitting daytime dose.  - Continue inpatient hospitalization  -Schizoaffective disorder, Bipolar Type            - Restart thorazine 50mg  TID    - Restart gabapentin 400mg  TID    - Change seroquel 400mg  qhs to seroquel 500mg  qhs    - Change seroquel 100mg  qAM to seroquel 50mg  BID (@0800  and @1400 )    - Restart zoloft 200mg  po qDay    - Restart topamax 50mg  po BID   - Restart trazodone 25mg  po BID  -DMII    - Restart home medications for DMII  -Anxiety             - Change atarax 25mg  po q6h prn anxiety to atarax 50mg  po q6h prn anxiety  -Insomnia             - Continue trazodone 50mg  po qhs prn  insomnia  -Encourage participation in groups and the therapeutic milieu  -Discharge planning will be ongoing    Pennelope Bracken, MD 02/07/2017, 1:29 PM

## 2017-02-07 NOTE — Progress Notes (Signed)
Order for PRN Novolog 22 units as needed for High Blood Sugar not given. Unaware of parameters needed to hold medication, as well as scheduled Levemir given at 1700 as ordered. CBG 354 at time of Levemir administration.

## 2017-02-08 LAB — GLUCOSE, CAPILLARY
GLUCOSE-CAPILLARY: 335 mg/dL — AB (ref 65–99)
Glucose-Capillary: 175 mg/dL — ABNORMAL HIGH (ref 65–99)
Glucose-Capillary: 400 mg/dL — ABNORMAL HIGH (ref 65–99)
Glucose-Capillary: 420 mg/dL — ABNORMAL HIGH (ref 65–99)

## 2017-02-08 NOTE — BHH Group Notes (Signed)
Pt did not attend wrap up group. Pt stayed in the hallway instead.

## 2017-02-08 NOTE — Plan of Care (Signed)
Nurse discussed depression, anxiety, coping skills with patient.  

## 2017-02-08 NOTE — Progress Notes (Signed)
  D: Patient lying in bed. Respirations even and non-labored. A: Staff will monitor on 1:1 for safety R: Appears to be sleeping

## 2017-02-08 NOTE — Progress Notes (Signed)
  D: Patient up at nurse' station c/o increased anxiety and agitation. Stating that she feels like running and trying to leave like before. Reports continued self harm thought. Requesting medication at this time. Bedtime medication given along with prn thorazine because she said that actually helps. A: Staff will continue to monitor on 1:1 for safety R: Took medications and has been cooperative thus far tonight.
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
## 2017-02-08 NOTE — Progress Notes (Signed)
1:1 Note 1025  Patient continued to lay in bed.   MD/SW talked to patient in her room.  Patient denied SI and HI, contracts for safety.  Denied A/V hallucinations.  MD stated 1:1 could be discontinued.  Patient excited that she can now go to dining room for meals.  Patient presently sitting near phone smiling.  Stated she was feeling better now.  Respirations even and unlabored.  No signs/symptoms of pain/distress noted on patient's face/body movements.

## 2017-02-08 NOTE — Progress Notes (Signed)
1:1 Note discontinuation  1545  Patient went to dining room for dinner.  No complaints of pain or discomfort.  Safety maintained with 15 minute checks per MD order.

## 2017-02-08 NOTE — Progress Notes (Signed)
1:1 discontinuation 1425  Patient went to lunch in dining room.  Patient has been resting in her bed since lunch.   Respirations even and unlabored.  No signs/symptoms of pain/distress noted on patient's face/body movements.  Safety maintained with 15 minute checks.

## 2017-02-08 NOTE — Progress Notes (Signed)
1:1 Discontinuation Note Patient laying in bed with eyes closed.  No signs/symptoms of pain/distress noted on patient's face/body movements.  Safety maintained with 15 minute checks.

## 2017-02-08 NOTE — Progress Notes (Signed)
1:1 Discontinued Note Patient has been laying in her bed resting with eyes closed.  Patient walked to medication window, took oral medications and novolog 22 units sq per MD instructions.  Patient will be discharged tomorrow per MD.  Patient went to dining room for lunch.  Patient stated she is happy to be off 1:1.  Safety maintained with 15 minute checks.

## 2017-02-08 NOTE — Progress Notes (Signed)
1:1 Note discontinuation 1700  Patient resting in her room.  CBG at 1700 was 400.  MD stated to give levemir 68 units and novolog 22 units.  NP informed and charge nurse.  Respirations even and unlabored.  No signs/symptoms of pain/distress noted on patient's face/body movements.

## 2017-02-08 NOTE — Progress Notes (Addendum)
Inpatient Diabetes Program Recommendations  AACE/ADA: New Consensus Statement on Inpatient Glycemic Control (2015)  Target Ranges:  Prepandial:   less than 140 mg/dL      Peak postprandial:   less than 180 mg/dL (1-2 hours)      Critically ill patients:  140 - 180 mg/dL   Results for EUVA, RUNDELL (MRN [id number]) as of 02/08/2017 07:33  Ref. Range 02/07/2017 08:52 02/07/2017 16:46 02/07/2017 20:30  Glucose-Capillary Latest Ref Range: 65 - 99 mg/dL 382 (H) 354 (H)  68 units Levemir 296 (H)   Results for ROSALYND, MCWRIGHT (MRN [id number]) as of 02/08/2017 07:33  Ref. Range 02/08/2017 06:19  Glucose-Capillary Latest Ref Range: 65 - 99 mg/dL 175 (H)     Admit: Schizoaffective disorder bipolar type and Cluster B personality disorder who was admitted on IVC after she had a suicide attempt via overdose  History: DM  Home DM Meds: Levemir 68 units BID       Novolog 22 units TID PRN       Trulicity 1.5 mg Qweek  Current Orders: Levemir 68 units BID       Novolog 22 units TID PRN       Trulicity 1.5 mg Qweek       MD- Please consider the following in-hospital insulin adjustments:  1. Change diet orders to Carbohydrate Modified diet  2. Decrease Levemir to 50 units BID (75% total home dose to start)  3. Discontinue Novolog 22 units TID PRN  4. Start Novolog Moderate Correction Scale/ SSI (0-15 units) TID AC + HS  5. Start Novolog Meal Coverage: Novolog 6 units TID with meals (hold if pt eats <50% of meal)       --Will follow patient during hospitalization--  Wyn Quaker RN, MSN, CDE Diabetes Coordinator Inpatient Glycemic Control Team Team Pager: [phone] (8a-5p)

## 2017-02-08 NOTE — Progress Notes (Signed)
[facility] MD Progress Note  02/08/2017 12:49 PM Tina SHACKETT  MRN:  [id number] Subjective:    Tina Saunders is a 32 y/o F with history of schizoaffective disorder bipolar type and Cluster B personality disorder who was admitted on IVC after she had a suicide attempt via overdose. Pt was brought in by ambulance after she called 911 and reported that she had ingested a handful of pills in a suicide attempt. When emergency services arrived on the scene, pt was agitated and told EMS staff she was upset that her suicide attempt was not successful. In the ED she remained agitated and disorganized; she attempted to leave multiple times. Pt was placed on IVC and transferred to [facility] for additional treatment and stabilization. At initial evaluation, she remained too drowsy to participate in the interview, so she was just initially supported with PRN medications for agitation. She had ongoing episodes of agitation and demanded to leave so that she could go home and kill herself; she required IM geodon to control her agitation. She was placed on 1:1 observation due to her inability to contract for safety on the unit. Pt had improvement of her mood symptoms, and she was able to participate in interview. She agreed to be resumed on home medications with adjustment of her seroquel dosing to higher dose at night and a lower BID dose during the day.  Today upon evaluation, pt reports she is "feeling better." She continues, "The voices are gone today." She denies SI/HI/AH/VH. She is able to contract for safety and requests that 1:1 observation be discontinued as well as unit restriction so that she can go to the cafeteria. Pt shares, "I think I feel good enough to go home today - the medication changes are working." Discussed with patient that today is her first day of improvement, and we will discontinue her 1:1 observation and continue to monitor for symptom stability. If pt has ongoing stability of her symptoms and she is able to  engage in safety planning tomorrow, we will consider her discharge. Pt was in agreement with the above plan, and she agrees to continue her current regimen without changes. She had no further questions, comments, or concerns.   Principal Problem: Schizoaffective disorder, bipolar type ([place]) Diagnosis:   Patient Active Problem List   Diagnosis Date Noted  . Seizures ([place]) [R56.9] 12/11/2016  . Acute lower UTI [N39.0] 12/11/2016  . DM2 (diabetes mellitus, type 2) ([place]) [E11.9] 12/11/2016  . Uncontrolled diabetes mellitus ([place]) [E11.65] 11/03/2016  . Schizoaffective disorder, bipolar type ([place]) [F25.0] 11/02/2016  . Cluster B personality disorder ([place]) [F60.9] 11/02/2016  . Schizoaffective disorder, mixed type ([place]) [F25.0] 05/30/2014  . Suicidal ideation [R45.851]   . 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 Headache [R51] 04/15/2013  . HTN (hypertension) [I10] 04/15/2013  . Post traumatic stress disorder [F43.10] 12/07/2011  . CAP (community acquired pneumonia) [J18.9] 08/27/2011  . Chest pain [R07.9] 08/26/2011  . SOB (shortness of breath) [R06.02] 08/26/2011  . Fever [R50.9] 08/26/2011  . Hypokalemia [E87.6] 08/26/2011  . PSVT (paroxysmal supraventricular tachycardia) ([place]) [I47.1] 08/26/2011  . ADHD [F90.9] 09/23/2007  . EPIGASTRIC PAIN [R10.13] 09/23/2007  . Obesity, unspecified [E66.9] 07/30/2007  . DEPRESSION [F32.9] 07/30/2007  . SLEEP DISORDER [G47.9] 07/30/2007  . IMPAIRED FASTING GLUCOSE [R73.01] 07/30/2007  . FATIGUE [R53.81, R53.83] 11/21/2006  . ABNORMAL FINDINGS, ELEVATED BP W/O HTN [R03.0] 11/21/2006  . METRORRHAGIA [N92.1] 06/13/2006  . DISORDER, MENSTRUAL NEC [N94.9] 06/13/2006  . DIZZINESS [R42] 06/13/2006  .  POLYCYSTIC OVARIAN DISEASE [E28.2] 04/25/2006  . AMENORRHEA, SECONDARY [N91.2] 04/20/2006  . ACNE, MILD [L70.8] 04/20/2006  . ABDOMINAL PAIN [R10.9] 04/20/2006   Total Time spent with patient: 30 minutes  Past Psychiatric History: see H&P  Past  Medical History:  Past Medical History:  Diagnosis Date  . Anxiety   . Bipolar 1 disorder ([place])   . Cancer of abdominal wall   . Depression   . Diabetes mellitus without complication ([place])   . Hypertension   . Obesity   . Obesity   . Polycystic ovarian syndrome 07/01/2011   Patient report  . 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 Schizophrenia [facility])     Past Surgical History:  Procedure Laterality Date  . CHOLECYSTECTOMY    . HERNIA REPAIR    . Ovarian Cyst Excision    . VARICOSE VEIN SURGERY     Family History:  Family History  Problem Relation Age of Onset  . Coronary artery disease Maternal Grandmother   . Diabetes type II Maternal Grandmother   . Cancer Maternal Grandmother   . Hypertension Mother   . Hypertension Father    Family Psychiatric  History: see H&P Social History:  Social History   Substance and Sexual Activity  Alcohol Use No     Social History   Substance and Sexual Activity  Drug Use No    Social History   Socioeconomic History  . Marital status: Single    Spouse name: None  . Number of children: 0  . Years of education: None  . Highest education level: None  Social Needs  . Financial resource strain: None  . Food insecurity - worry: None  . Food insecurity - inability: None  . Transportation needs - medical: None  . Transportation needs - non-medical: None  Occupational History  . None  Tobacco Use  . Smoking status: Never Smoker  . Smokeless tobacco: Never Used  Substance and Sexual Activity  . Alcohol use: No  . Drug use: No  . Sexual activity: No    Birth control/protection: None  Other Topics Concern  . None  Social History Narrative  . None   Additional Social History:                         Sleep: Good  Appetite:  Good  Current Medications: Current Facility-Administered Medications  Medication Dose Route Frequency Provider Last Rate Last Dose  . acetaminophen (TYLENOL) tablet 650 mg  650 mg Oral Q6H PRN Okonkwo,  Justina A, NP      . alum & mag hydroxide-simeth (MAALOX/MYLANTA) 200-200-20 MG/5ML suspension 30 mL  30 mL Oral Q4H PRN Okonkwo, Justina A, NP      . cephALEXin (KEFLEX) capsule 500 mg  500 mg Oral TID Pennelope Bracken, MD   500 mg at 02/08/17 1201  . chlorproMAZINE (THORAZINE) injection 50 mg  50 mg Intramuscular Q2H PRN Pennelope Bracken, MD   50 mg at 02/07/17 2051  . chlorproMAZINE (THORAZINE) tablet 50 mg  50 mg Oral TID Pennelope Bracken, MD   50 mg at 02/08/17 1201  . cloNIDine (CATAPRES) tablet 0.1 mg  0.1 mg Oral BID Pennelope Bracken, MD   0.1 mg at 02/08/17 4431  . dicyclomine (BENTYL) tablet 20 mg  20 mg Oral Q8H PRN Pennelope Bracken, MD      . diphenhydrAMINE (BENADRYL) injection 50 mg  50 mg Intramuscular Once Laverle Hobby, PA-C      . [  START ON 02/09/2017] Dulaglutide SOPN 1.5 mg  1.5 mg Subcutaneous Q Fri Laquon Emel T, MD      . fenofibrate tablet 160 mg  160 mg Oral Daily Pennelope Bracken, MD   160 mg at 02/08/17 4627  . gabapentin (NEURONTIN) capsule 400 mg  400 mg Oral TID Pennelope Bracken, MD   400 mg at 02/08/17 1201  . hydrOXYzine (ATARAX/VISTARIL) tablet 50 mg  50 mg Oral TID PRN Pennelope Bracken, MD      . insulin aspart (novoLOG) injection 22 Units  22 Units Subcutaneous TID WC PRN Pennelope Bracken, MD   22 Units at 02/08/17 1210  . insulin detemir (LEVEMIR) injection 68 Units  68 Units Subcutaneous BID Pennelope Bracken, MD   68 Units at 02/08/17 0904  . levETIRAcetam (KEPPRA) tablet 500 mg  500 mg Oral BID Pennelope Bracken, MD   500 mg at 02/08/17 0831  . lisinopril (PRINIVIL,ZESTRIL) tablet 10 mg  10 mg Oral Daily Pennelope Bracken, MD   10 mg at 02/08/17 0830  . OLANZapine zydis (ZYPREXA) disintegrating tablet 10 mg  10 mg Oral Q8H PRN Pennelope Bracken, MD       And  . LORazepam (ATIVAN) tablet 1 mg  1 mg Oral PRN Pennelope Bracken, MD      .  magnesium hydroxide (MILK OF MAGNESIA) suspension 30 mL  30 mL Oral Daily PRN Okonkwo, Justina A, NP      . pantoprazole (PROTONIX) EC tablet 40 mg  40 mg Oral Daily Pennelope Bracken, MD   40 mg at 02/08/17 0830  . QUEtiapine (SEROQUEL) tablet 50 mg  50 mg Oral BID Pennelope Bracken, MD   50 mg at 02/08/17 0830  . QUEtiapine (SEROQUEL) tablet 500 mg  500 mg Oral QHS Pennelope Bracken, MD   500 mg at 02/07/17 2041  . traZODone (DESYREL) tablet 50 mg  50 mg Oral QHS PRN Lu Duffel, Justina A, NP   50 mg at 02/07/17 2041    Lab Results:  Results for orders placed or performed during the hospital encounter of 02/06/17 (from the past 48 hour(s))  Glucose, capillary     Status: Abnormal   Collection Time: 02/07/17  8:52 AM  Result Value Ref Range   Glucose-Capillary 382 (H) 65 - 99 mg/dL  Glucose, capillary     Status: Abnormal   Collection Time: 02/07/17  4:46 PM  Result Value Ref Range   Glucose-Capillary 354 (H) 65 - 99 mg/dL  Glucose, capillary     Status: Abnormal   Collection Time: 02/07/17  8:30 PM  Result Value Ref Range   Glucose-Capillary 296 (H) 65 - 99 mg/dL   Comment 1 Notify RN    Comment 2 Document in Chart   Glucose, capillary     Status: Abnormal   Collection Time: 02/08/17  6:19 AM  Result Value Ref Range   Glucose-Capillary 175 (H) 65 - 99 mg/dL   Comment 1 Notify RN   Glucose, capillary     Status: Abnormal   Collection Time: 02/08/17 11:48 AM  Result Value Ref Range   Glucose-Capillary 335 (H) 65 - 99 mg/dL    Blood Alcohol level:  Lab Results  Component Value Date   ETH <10 10/31/2016   ETH <5 03/50/0938    Metabolic Disorder Labs: Lab Results  Component Value Date   HGBA1C 10.9 (H) 11/03/2016   MPG 266.13 11/03/2016   Lab Results  Component Value  Date   PROLACTIN 63.7 (H) 11/03/2016   PROLACTIN 10.9 06/13/2006   Lab Results  Component Value Date   CHOL 113 11/03/2016   TRIG 366 (H) 11/03/2016   HDL 19 (L) 11/03/2016    CHOLHDL 5.9 11/03/2016   VLDL 73 (H) 11/03/2016   LDLCALC 21 11/03/2016   LDLCALC 42 04/26/2006    Physical Findings: AIMS: Facial and Oral Movements Muscles of Facial Expression: None, normal Lips and Perioral Area: None, normal Jaw: None, normal Tongue: None, normal,Extremity Movements Upper (arms, wrists, hands, fingers): None, normal Lower (legs, knees, ankles, toes): None, normal, Trunk Movements Neck, shoulders, hips: None, normal, Overall Severity Severity of abnormal movements (highest score from questions above): None, normal Incapacitation due to abnormal movements: None, normal Patient's awareness of abnormal movements (rate only patient's report): No Awareness, Dental Status Current problems with teeth and/or dentures?: No Does patient usually wear dentures?: No  CIWA:  CIWA-Ar Total: 1 COWS:  COWS Total Score: 2  Musculoskeletal: Strength & Muscle Tone: within normal limits Gait & Station: normal Patient leans: N/A  Psychiatric Specialty Exam: Physical Exam  Nursing note and vitals reviewed.   Review of Systems  Constitutional: Negative for chills and fever.  Respiratory: Negative for cough.   Cardiovascular: Negative for chest pain.  Gastrointestinal: Negative for abdominal pain, heartburn, nausea and vomiting.  Psychiatric/Behavioral: Negative for depression, hallucinations and suicidal ideas. The patient is not nervous/anxious and does not have insomnia.     Blood pressure 115/65, pulse 68, temperature (!) 97.5 F (36.4 C), temperature source Oral, resp. rate 18, height 5\' 4"  (1.626 m), weight 105.7 kg (233 lb), last menstrual period 02/01/2017, SpO2 96 %.Body mass index is 39.99 kg/m.  General Appearance: Casual and Fairly Groomed  Eye Contact:  Good  Speech:  Clear and Coherent and Normal Rate  Volume:  Normal  Mood:  Euthymic  Affect:  Appropriate, Congruent and Constricted  Thought Process:  Coherent and Goal Directed  Orientation:  Full (Time,  Place, and Person)  Thought Content:  Logical  Suicidal Thoughts:  No  Homicidal Thoughts:  No  Memory:  Immediate;   Fair Recent;   Fair Remote;   Fair  Judgement:  Fair  Insight:  Fair  Psychomotor Activity:  Normal  Concentration:  Concentration: Fair  Recall:  [employer] of Knowledge:  Fair  Language:  Fair  Akathisia:  No  Handed:    AIMS (if indicated):     Assets:  Communication Skills Resilience Social Support  ADL's:  Intact  Cognition:  WNL  Sleep:  Number of Hours: 2     Treatment Plan Summary: Daily contact with patient to assess and evaluate symptoms and progress in treatment and Medication management. Pt reports improvement of AH, SI, and mood symptoms. She has significant improvement of her irritability and she is able to contract for safety on the unit. She requests to discharge, and she is in agreement to remain on the unit until tomorrow to observe for symptom stability.  - Continue inpatient hospitalization  -Schizoaffective disorder, Bipolar Type - Continue thorazine 50mg  TID            - Continue gabapentin 400mg  TID            - Continue seroquel 500mg  qhs            - Continue seroquel 50mg  BID (@0800  and @1400 )            - Continue zoloft 200mg  po qDay            -  Continue topamax 50mg  po BID             -Continue trazodone 25mg  po BID  -DMII             - Continue  home medications for DMII  -Anxiety - Continue atarax 50mg  po q6h prn anxiety  -Insomnia - Continue trazodone 50mg  po qhs prn insomnia  -Encourage participation in groups and the therapeutic milieu  -Discharge planning will be ongoing    Pennelope Bracken, MD 02/08/2017, 12:49 PM

## 2017-02-08 NOTE — Progress Notes (Addendum)
1:1 Note 0930  Patient resting in bed with eyes closed.  Patient sat up in bed, took all p.o. Medications, and insulin without complaint.  Patient denied SI and HI.  Denied A/V hallucinations.  Denied pain.  Patient had eaten all her breakfast.  Patient stated she needed to rest.  Respirations even and unlabored.  No signs/symptoms of pain/distress noted on patient's face/body movements.  1:1 continues for patient's safety per MD order.

## 2017-02-08 NOTE — Progress Notes (Signed)
Recreation Therapy Notes  Date: 02/08/17 Time: 0940 Location: 500 Hall Dayroom  Group Topic: Leisure Education  Goal Area(s) Addresses:  Patient will identify positive leisure activities.  Patient will identify one positive benefit of participation in leisure activities.   Intervention: [employer], dry erase marker, container of words  Activity: Pictionary.  Each patient would get a chance to come up to the board.  The patient would then pick a slip of paper from the container and attempt to draw what was on the paper on the board.  The remaining patients would then guess what the picture is.  The person that guesses correctly, gets the next turn.  Education:  Leisure Education, Dentist  Education Outcome: Acknowledges education/In group clarification offered/Needs additional education  Clinical Observations/Feedback: Pt did not attend group.    Victorino Sparrow, LRT/CTRS        Victorino Sparrow A 02/08/2017 12:44 PM

## 2017-02-08 NOTE — Progress Notes (Signed)
1:1 Note 0800  Patient resting comfortably in bed with eyes closed.  No signs/symptoms of pain/distress noted on patient's face/body movements.  Respirations even and unlabored.  1:1 continues for safety per MD orders.

## 2017-02-08 NOTE — Progress Notes (Signed)
  D: Patient lying in bed with eyes closed. Respirations even and non-labored. A: Staff will continue to monitor on 1:1 for safety. R: Appears asleep.

## 2017-02-08 NOTE — [facility] Group Notes (Signed)
[place] LCSW Group Therapy 02/08/2017 1:15pm  Type of Therapy: Group Therapy- Feelings Around Discharge & Establishing a Supportive Framework  Participation Level:  Did Not Attend  Description of Group:   What is a supportive framework? What does it look like feel like and how do I discern it from and unhealthy non-supportive network? Learn how to cope when supports are not helpful and don't support you. Discuss what to do when your family/friends are not supportive.  Summary of Patient Progress   Therapeutic Modalities:   Cognitive Behavioral Therapy Person-Centered Therapy Motivational Interviewing   [name], [place] Work 02/08/2017 2:32 PM

## 2017-02-08 NOTE — Progress Notes (Signed)
1:1 discontinuation note 1500  Patient came to nurse and asked for thorazine, stated she felt that she was "going to lose it and did not want to".  Thorazine prn IM given to patient per MD order.  Patient stated she does feel SI and the voices/mumblings are starting again, telling her to escape from here.  Patient does not want to ruin her discharge plan for tomorrow.  Denied HI and denied visual hallucinations.  Patient stated she was going to return to her bed and did not want to go to dining room for dinner.  Safety maintained with 15 minute checks. Marland Kitchen

## 2017-02-09 LAB — GLUCOSE, CAPILLARY
Glucose-Capillary: 230 mg/dL — ABNORMAL HIGH (ref 65–99)
Glucose-Capillary: 273 mg/dL — ABNORMAL HIGH (ref 65–99)
Glucose-Capillary: 413 mg/dL — ABNORMAL HIGH (ref 65–99)
Glucose-Capillary: 295 mg/dL — ABNORMAL HIGH (ref 65–99)

## 2017-02-09 MED ORDER — HYDROXYZINE HCL 50 MG PO TABS: 50.0000 mg | ORAL_TABLET | Freq: Three times a day (TID) | ORAL | 0 refills | Status: DC | PRN

## 2017-02-09 MED ORDER — DULAGLUTIDE 1.5 MG/0.5ML ~~LOC~~ SOAJ: 1.5000 mg | SUBCUTANEOUS | Status: DC

## 2017-02-09 MED ORDER — QUETIAPINE FUMARATE 50 MG PO TABS: 50.0000 mg | ORAL_TABLET | Freq: Two times a day (BID) | ORAL | 0 refills | Status: DC

## 2017-02-09 MED ORDER — LEVETIRACETAM 500 MG PO TABS: 500.0000 mg | ORAL_TABLET | Freq: Two times a day (BID) | ORAL | 0 refills | Status: DC

## 2017-02-09 MED ORDER — INSULIN ASPART 100 UNIT/ML ~~LOC~~ SOLN: 6.0000 [IU] | Freq: Three times a day (TID) | SUBCUTANEOUS | 0 refills | Status: DC

## 2017-02-09 MED ORDER — DICYCLOMINE HCL 20 MG PO TABS: 20.0000 mg | ORAL_TABLET | Freq: Three times a day (TID) | ORAL | Status: DC | PRN

## 2017-02-09 MED ORDER — CHLORPROMAZINE HCL 50 MG PO TABS: 50.0000 mg | ORAL_TABLET | Freq: Three times a day (TID) | ORAL | 0 refills | Status: DC

## 2017-02-09 MED ORDER — INSULIN ASPART 100 UNIT/ML ~~LOC~~ SOLN
6.0000 [IU] | Freq: Three times a day (TID) | SUBCUTANEOUS | Status: DC
  Administered 2017-02-09 (×2): 6 [IU] via SUBCUTANEOUS

## 2017-02-09 MED ORDER — INSULIN DETEMIR 100 UNIT/ML ~~LOC~~ SOLN
50.0000 [IU] | Freq: Two times a day (BID) | SUBCUTANEOUS | Status: DC
  Administered 2017-02-09: 50 [IU] via SUBCUTANEOUS

## 2017-02-09 MED ORDER — TRAZODONE HCL 50 MG PO TABS: 50.0000 mg | ORAL_TABLET | Freq: Every evening | ORAL | 0 refills | Status: DC | PRN

## 2017-02-09 MED ORDER — INSULIN ASPART 100 UNIT/ML ~~LOC~~ SOLN: 0.0000 [IU] | Freq: Every day | SUBCUTANEOUS | Status: DC

## 2017-02-09 MED ORDER — CEPHALEXIN 500 MG PO CAPS: 500.0000 mg | ORAL_CAPSULE | Freq: Three times a day (TID) | ORAL | Status: DC

## 2017-02-09 MED ORDER — CLONIDINE HCL 0.1 MG PO TABS: 0.1000 mg | ORAL_TABLET | Freq: Two times a day (BID) | ORAL | 0 refills | Status: DC

## 2017-02-09 MED ORDER — FENOFIBRATE 160 MG PO TABS: 160.0000 mg | ORAL_TABLET | Freq: Every day | ORAL | 0 refills | Status: DC

## 2017-02-09 MED ORDER — LISINOPRIL 10 MG PO TABS: 10.0000 mg | ORAL_TABLET | Freq: Every day | ORAL | 0 refills | Status: DC

## 2017-02-09 MED ORDER — QUETIAPINE FUMARATE 100 MG PO TABS: 500.0000 mg | ORAL_TABLET | Freq: Every day | ORAL | 0 refills | Status: DC

## 2017-02-09 MED ORDER — GABAPENTIN 400 MG PO CAPS: 400.0000 mg | ORAL_CAPSULE | Freq: Three times a day (TID) | ORAL | 0 refills | Status: DC

## 2017-02-09 MED ORDER — INSULIN DETEMIR 100 UNIT/ML ~~LOC~~ SOLN: 50.0000 [IU] | Freq: Two times a day (BID) | SUBCUTANEOUS | 0 refills | Status: DC

## 2017-02-09 MED ORDER — INSULIN ASPART 100 UNIT/ML ~~LOC~~ SOLN
0.0000 [IU] | Freq: Three times a day (TID) | SUBCUTANEOUS | Status: DC
  Administered 2017-02-09 (×2): 8 [IU] via SUBCUTANEOUS

## 2017-02-09 MED ORDER — PANTOPRAZOLE SODIUM 40 MG PO TBEC: 40.0000 mg | DELAYED_RELEASE_TABLET | Freq: Every day | ORAL | 0 refills | Status: DC

## 2017-02-09 NOTE — Progress Notes (Signed)
D:Per report pt received 6 units novolog this morning. Pt's CBG recehecked following breakfast at 413. Reported to NP and was instructed to give 6 units of novolog and 50 units of levemir as ordered. Reported to MD at treatment. Per MD recheck before d/c.

## 2017-02-09 NOTE — Progress Notes (Signed)
Pt d/c from the hospital. All items returned. D/C instructions given and prescriptions given. Pt denies si and hi. 

## 2017-02-09 NOTE — Progress Notes (Signed)
Recreation Therapy Notes  Date: 02/09/17 Time: 1000 Location: 500 Hall  Group Topic: Communication, Team Building, Problem Solving  Goal Area(s) Addresses:  Patient will effectively work with peer towards shared goal.  Patient will identify skills used to make activity successful.  Patient will identify how skills used during activity can be used to reach post d/c goals.   Intervention: Rubber disc  Activity: Sharks in [employer].  Each person was given a rubber disc plus one extra disc for the group.  As a team, the patients will use the discs to move everyone from the starting point to the end of the hall and back.  If anyone stepped off the disc, the group would have to start over from the beginning.  Education:Social Skills, Discharge Planning.   Education Outcome: Acknowledges education/In group clarification offered/Needs additional education.   Clinical Observations/Feedback: Pt did not attend group.   Victorino Sparrow, LRT/CTRS          Victorino Sparrow A 02/09/2017 12:41 PM

## 2017-02-09 NOTE — Progress Notes (Signed)
Patient with FSBS of [place] NP made aware. Patient received prn novolog 22 units ok to give per Lindon Romp NP. Patient FSBS rechecked at 0128 and FSBS 230 monitoring of patient continues. Patient is alert and verbal and in bed resting.

## 2017-02-09 NOTE — Progress Notes (Signed)
D:Patient observed on unit at entrance door insisting to leave and be discharged. Patient refusing medication at this time. Patient kicking door.  A:Patient asked to come from door and reminded that this is not the way to go home. Patient educated on her behavior and how it could delay discharge home. Patient offered prn anxiety medication and she did agree to take medication. R:Patient walked away from door and behavior improved after talking with this Probation officer. PRN medication for anxiety administered with good results noted. Patient with Q 15 minute checks in progress and patient remains safe on unit.

## 2017-02-09 NOTE — Plan of Care (Signed)
Patient did sleep all night after taking her medication and prn trazodone for sleep. Monitoring continues.

## 2017-02-09 NOTE — Progress Notes (Signed)
Insulin adjustments made based on the below recommendations of Diabetes Coordinator.   MD- Please consider the following in-hospital insulin adjustments:  1. Change diet orders to Carbohydrate Modified diet  2. Decrease Levemir to 50 units BID (75% total home dose to start)  3. Discontinue Novolog 22 units TID PRN  4. Start Novolog Moderate Correction Scale/ SSI (0-15 units) TID AC + HS  5. Start Novolog Meal Coverage: Novolog 6 units TID with meals (hold if pt eats <50% of meal)

## 2017-02-09 NOTE — [facility] Suicide Risk Assessment (Signed)
[facility] Discharge Suicide Risk Assessment   Principal Problem: Schizoaffective disorder, bipolar type [facility]) Discharge Diagnoses:  Patient Active Problem List   Diagnosis Date Noted  . Seizures ([place]) [R56.9] 12/11/2016  . Acute lower UTI [N39.0] 12/11/2016  . DM2 (diabetes mellitus, type 2) ([place]) [E11.9] 12/11/2016  . Uncontrolled diabetes mellitus ([place]) [E11.65] 11/03/2016  . Schizoaffective disorder, bipolar type ([place]) [F25.0] 11/02/2016  . Cluster B personality disorder ([place]) [F60.9] 11/02/2016  . Schizoaffective disorder, mixed type ([place]) [F25.0] 05/30/2014  . Suicidal ideation [R45.851]   . 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 Headache [R51] 04/15/2013  . HTN (hypertension) [I10] 04/15/2013  . Post traumatic stress disorder [F43.10] 12/07/2011  . CAP (community acquired pneumonia) [J18.9] 08/27/2011  . Chest pain [R07.9] 08/26/2011  . SOB (shortness of breath) [R06.02] 08/26/2011  . Fever [R50.9] 08/26/2011  . Hypokalemia [E87.6] 08/26/2011  . PSVT (paroxysmal supraventricular tachycardia) ([place]) [I47.1] 08/26/2011  . ADHD [F90.9] 09/23/2007  . EPIGASTRIC PAIN [R10.13] 09/23/2007  . Obesity, unspecified [E66.9] 07/30/2007  . DEPRESSION [F32.9] 07/30/2007  . SLEEP DISORDER [G47.9] 07/30/2007  . IMPAIRED FASTING GLUCOSE [R73.01] 07/30/2007  . FATIGUE [R53.81, R53.83] 11/21/2006  . ABNORMAL FINDINGS, ELEVATED BP W/O HTN [R03.0] 11/21/2006  . METRORRHAGIA [N92.1] 06/13/2006  . DISORDER, MENSTRUAL NEC [N94.9] 06/13/2006  . DIZZINESS [R42] 06/13/2006  . POLYCYSTIC OVARIAN DISEASE [E28.2] 04/25/2006  . AMENORRHEA, SECONDARY [N91.2] 04/20/2006  . ACNE, MILD [L70.8] 04/20/2006  . ABDOMINAL PAIN [R10.9] 04/20/2006    Total Time spent with patient: 30 minutes  Musculoskeletal: Strength & Muscle Tone: within normal limits Gait & Station: normal Patient leans: N/A  Psychiatric Specialty Exam: Review of Systems  Constitutional: Negative for chills and fever.   Respiratory: Negative for cough and shortness of breath.   Cardiovascular: Negative for chest pain.  Gastrointestinal: Negative for abdominal pain, heartburn, nausea and vomiting.  Psychiatric/Behavioral: Negative for depression, hallucinations and suicidal ideas. The patient is not nervous/anxious.     Blood pressure 121/70, pulse (!) 125, temperature 97.7 F (36.5 C), temperature source Oral, resp. rate 18, height 5\' 4"  (1.626 m), weight 105.7 kg (233 lb), last menstrual period 02/01/2017, SpO2 96 %.Body mass index is 39.99 kg/m.  General Appearance: Casual and Disheveled  Eye Contact::  Fair  Speech:  Clear and Coherent and Normal Rate  Volume:  Normal  Mood:  Anxious and Depressed  Affect:  Blunt, Congruent and Constricted  Thought Process:  Coherent and Goal Directed  Orientation:  Full (Time, Place, and Person)  Thought Content:  Logical  Suicidal Thoughts:  No  Homicidal Thoughts:  No  Memory:  Immediate;   Good Recent;   Good Remote;   Good  Judgement:  Fair  Insight:  Fair  Psychomotor Activity:  Normal  Concentration:  Fair  Recall:  [employer] of Knowledge:Fair  Language: Fair  Akathisia:  No  Handed:    AIMS (if indicated):     Assets:  Desire for Improvement Housing Resilience Social Support Transportation  Sleep:  Number of Hours: 6.75  Cognition: WNL  ADL's:  Intact   Mental Status Per Nursing Assessment::   On Admission:  Suicidal ideation indicated by patient  Demographic Factors:  Caucasian, Low socioeconomic status and Unemployed  Loss Factors: Financial problems/change in socioeconomic status  Historical Factors: Impulsivity  Risk Reduction Factors:   Positive social support, Positive therapeutic relationship and Positive coping skills or problem solving skills  Continued Clinical Symptoms:  Severe Anxiety and/or Agitation Schizophrenia:   Paranoid or  undifferentiated type Personality Disorders:   Cluster B  Cognitive Features That  Contribute To Risk:  None    Suicide Risk:  Minimal: No identifiable suicidal ideation.  Patients presenting with no risk factors but with morbid ruminations; may be classified as minimal risk based on the severity of the depressive symptoms  Subjective Data:  Tina Saunders is a 32 y/o F with history of schizoaffective disorder bipolar type and Cluster B personality disorder who was admitted on IVC after she had a suicide attempt via overdose. Pt was brought in by ambulance after she called 911 and reported that she had ingested a handful of pills in a suicide attempt. When emergency services arrived on the scene, pt was agitated and told EMS staff she was upset that her suicide attempt was not successful. In the ED she remained agitated and disorganized; she attempted to leave multiple times. Pt was placed on IVC and transferred to [facility] for additional treatment and stabilization.At initial evaluation, she remained too drowsy to participate in the interview, soshe was just initially supported with PRN medications for agitation. She had ongoing episodes of agitation and demanded to leave so that she could go home and kill herself; she required IM geodon to control her agitation. She was placed on 1:1 observation due to her inability to contract for safety on the unit. Pt had improvement of her mood symptoms with use of PRN medications, and she was resumed on home medications with adjustment of her seroquel dosing to higher dose at night and a lower BID dose during the day. She had continued improvement of her symptoms and denied SI/HI/AH/VH.   Today upon evaluation, pt shares that her mood is "good." She is sleeping well. She denies SI/HI/AH/VH. She shares that she feels ready to discharge to home today. When asked what has changed during her stay, pt replies, "My medications - they are helping more now; I like the way they are now." Pt has consistently elevated blood sugar during her admission despite  continuing home insulin orders, and pt was counseled to monitor her dietary intake and also follow up with her ACT team and PCP regarding management of her diabetes, and pt verbalized good understanding. Pt had future orientation about her outpatient follow up and goal of obtaining a service dog with help of her ACT team. She was able to engage in safety planning including plan to return to [facility] or contact emergency services if she feels unable to maintain her own safety or the safety of others. Pt had no further questions, comments, or concerns.   Plan Of Care/Follow-up recommendations:   -Discharge to outpatient level of care  -Schizoaffective disorder, Bipolar Type - Continue thorazine 50mg  TID - Continue gabapentin 400mg  TID  - Continue seroquel 500mg  qhs   - Continue seroquel 50mg  BID (@0800  and @1400 ) - Continue zoloft 200mg  po qDay - Continue topamax 50mg  po BID -Continue trazodone 25mg  po BID  -DMII - Continue  home medications for DMII  -Anxiety -Continue atarax 50mg  po q6h prn anxiety  -Insomnia -Continuetrazodone 50mg  po qhs prn insomnia  Activity:  as tolerated Diet:  normal Tests:  NA Other:  See above for DC plan  Pennelope Bracken, MD 02/09/2017, 10:08 AM

## 2017-02-09 NOTE — Plan of Care (Signed)
Pt did not attend groups.   Victorino Sparrow, LRT/CTRS

## 2017-02-09 NOTE — Discharge Summary (Signed)
Physician Discharge Summary Note  Patient:  Tina Saunders is an 32 y.o., female  MRN:  [id number]  DOB:  1985/11/03  Patient phone:  [phone] (home)   Patient address:   [place] [address],   Total Time spent with patient: Greater than 30 minutes  Date of Admission:  02/06/2017 Date of Discharge: 02/09/2017  Reason for Admission: Suicide attempt by overdose.  Principal Problem: Schizoaffective disorder, bipolar type [facility])  Discharge Diagnoses: Patient Active Problem List   Diagnosis Date Noted  . Seizures ([place]) [R56.9] 12/11/2016  . Acute lower UTI [N39.0] 12/11/2016  . DM2 (diabetes mellitus, type 2) ([place]) [E11.9] 12/11/2016  . Uncontrolled diabetes mellitus ([place]) [E11.65] 11/03/2016  . Schizoaffective disorder, bipolar type ([place]) [F25.0] 11/02/2016  . Cluster B personality disorder ([place]) [F60.9] 11/02/2016  . Schizoaffective disorder, mixed type ([place]) [F25.0] 05/30/2014  . Suicidal ideation [R45.851]   . 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 Headache [R51] 04/15/2013  . HTN (hypertension) [I10] 04/15/2013  . Post traumatic stress disorder [F43.10] 12/07/2011  . CAP (community acquired pneumonia) [J18.9] 08/27/2011  . Chest pain [R07.9] 08/26/2011  . SOB (shortness of breath) [R06.02] 08/26/2011  . Fever [R50.9] 08/26/2011  . Hypokalemia [E87.6] 08/26/2011  . PSVT (paroxysmal supraventricular tachycardia) ([place]) [I47.1] 08/26/2011  . ADHD [F90.9] 09/23/2007  . EPIGASTRIC PAIN [R10.13] 09/23/2007  . Obesity, unspecified [E66.9] 07/30/2007  . DEPRESSION [F32.9] 07/30/2007  . SLEEP DISORDER [G47.9] 07/30/2007  . IMPAIRED FASTING GLUCOSE [R73.01] 07/30/2007  . FATIGUE [R53.81, R53.83] 11/21/2006  . ABNORMAL FINDINGS, ELEVATED BP W/O HTN [R03.0] 11/21/2006  . METRORRHAGIA [N92.1] 06/13/2006  . DISORDER, MENSTRUAL NEC [N94.9] 06/13/2006  . DIZZINESS [R42] 06/13/2006  . POLYCYSTIC OVARIAN DISEASE [E28.2] 04/25/2006  . AMENORRHEA,  SECONDARY [N91.2] 04/20/2006  . ACNE, MILD [L70.8] 04/20/2006  . ABDOMINAL PAIN [R10.9] 04/20/2006   Past Psychiatric History: Hx. Schizoaffective disorder.  Past Medical History:  Past Medical History:  Diagnosis Date  . Anxiety   . Bipolar 1 disorder ([place])   . Cancer of abdominal wall   . Depression   . Diabetes mellitus without complication ([place])   . Hypertension   . Obesity   . Obesity   . Polycystic ovarian syndrome 07/01/2011   Patient report  . 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 Schizophrenia [facility])     Past Surgical History:  Procedure Laterality Date  . CHOLECYSTECTOMY    . HERNIA REPAIR    . Ovarian Cyst Excision    . VARICOSE VEIN SURGERY     Family History:  Family History  Problem Relation Age of Onset  . Coronary artery disease Maternal Grandmother   . Diabetes type II Maternal Grandmother   . Cancer Maternal Grandmother   . Hypertension Mother   . Hypertension Father    Family Psychiatric  History: See H&P Social History:  Social History   Substance and Sexual Activity  Alcohol Use No     Social History   Substance and Sexual Activity  Drug Use No    Social History   Socioeconomic History  . Marital status: Single    Spouse name: None  . Number of children: 0  . Years of education: None  . Highest education level: None  Social Needs  . Financial resource strain: None  . Food insecurity - worry: None  . Food insecurity - inability: None  . Transportation needs - medical: None  . Transportation needs - non-medical: None  Occupational History  . None  Tobacco  Use  . Smoking status: Never Smoker  . Smokeless tobacco: Never Used  Substance and Sexual Activity  . Alcohol use: No  . Drug use: No  . Sexual activity: No    Birth control/protection: None  Other Topics Concern  . None  Social History Narrative  . None   Hospital Course: Tina Saunders is a 32 y/o F with history of schizoaffective disorder bipolar type and Cluster B personality  disorder who was admitted on IVC after she had a suicide attempt via overdose. Pt was brought in by ambulance after she called 911 and reported that she had ingested a handful of pills in a suicide attempt. When emergency services arrived on the scene, pt was agitated and told EMS staff she was upset that her suicide attempt was not successful. In the ED she remained agitated and disorganized; she attempted to leave multiple times and was messing with objects in the room such as the sharps container. Due to her out of control behaviors and suicide attempt, pt was placed on IVC and transferred to [facility] for additional treatment and stabilization. Upon initial presentation at [facility], pt is drowsy to the degree she is unable to participate in the interview without falling asleep. Pt falls asleep mid sentence multiple times during the interview. She had received IM PRN medication to manage her agitation at the [facility] ED.  [name] was admitted to the [facility] adult unit with complaints of suicide attempt by overdose. Reports indicated that she took the overdose, called & reported it to the EMS, got agitated with the EMS personal because her suicide attempt failed. She was brought to the hospital for further evaluation & treatment. [name] presented feeling very drowsy & unable to participate in the admission interview. She was recommended for mood stabilization treatment.    During the course of her hospitalization, Rashad was medicated & stabilized on; Thorazine 50 mg tid for mood stabilization, Gabapentin 400 mg for agitation, Hydroxyzine 50 mg prn for anxiety, Seroquel 50 mg & 500 mg respectively for agitation/mood control & Trazodone 50 mg for insomnia. She also received other medication regimen for the other medical issues presented. She was enrolled & participated in the Group counseling sessions being offered and held on this unit. She learned coping skills that should help her cope better & maintain mood stability  after discharge. She tolerated her treatment regimen without any adverse effects or reactions reported.  Health's symptoms responded well to her treatment regimen. This is evidenced by her reports of improved mood & presentation of good affect. She is curently being discharged to her place of resident as her mood has stabilized. However, she is going home on 2 separate antipsychotic medications (Torazine & Seroquel) because her symptoms has failed on numerous occasions to respond well under an antipsychotic monotherapy due to allergic reactions.The failed antipsychotic mono-therapies due to allergic reactions include; Haldol & Geodon. However, the combination of Thorazine & Seroquel seem to have helped improve her symptoms. As a result, it is necessary for Vallerie to continue on these combination antipsychotic therapies to achieve maximum symptom control after discharge. However, in the event that her symptoms happen to improve or decrease, she can then be titrated down to an antipsychotic monotherapy to avoid the risks of metabolic syndrome and other related adverse effects associated with use of multiple antipsychotic medications.This has to be done within the proper evaluation & judgement of her outpatient provider.   Sahily is currently mentally & medically stable. She is being discharged  to continue routine mental health care & medication management on an outpatient basis as noted below. She is provided with all the necessary information required to make this appointment without problems. Upon discharge, she adamantly denies any SIHI, AVH, delusional thoughts and or paranoia. She left [facility] with all personal belongings in no apparent distress. Transportation per family.Marland Kitchen  Physical Findings: AIMS: Facial and Oral Movements Muscles of Facial Expression: None, normal Lips and Perioral Area: None, normal Jaw: None, normal Tongue: None, normal,Extremity Movements Upper (arms, wrists, hands, fingers):  None, normal Lower (legs, knees, ankles, toes): None, normal, Trunk Movements Neck, shoulders, hips: None, normal, Overall Severity Severity of abnormal movements (highest score from questions above): None, normal Incapacitation due to abnormal movements: None, normal Patient's awareness of abnormal movements (rate only patient's report): No Awareness, Dental Status Current problems with teeth and/or dentures?: No Does patient usually wear dentures?: No  CIWA:  CIWA-Ar Total: 3 COWS:  COWS Total Score: 3  Musculoskeletal: Strength & Muscle Tone: within normal limits Gait & Station: normal Patient leans: N/A  Psychiatric Specialty Exam: See SRA by MD Physical Exam  Constitutional: She appears well-developed.  HENT:  Head: Normocephalic.  Eyes: Pupils are equal, round, and reactive to light.  Neck: Normal range of motion.  Cardiovascular: Normal rate.  Deferred  Respiratory: Effort normal.  GI: Soft.  Musculoskeletal: Normal range of motion.  Neurological: She is alert.  Skin: Skin is warm.    Review of Systems  Constitutional: Negative.   HENT: Negative.   Eyes: Negative.   Respiratory: Negative.   Cardiovascular: Negative.   Gastrointestinal: Negative.   Genitourinary: Negative.   Skin: Negative.   Neurological: Negative.   Endo/Heme/Allergies: Negative.   Psychiatric/Behavioral: Positive for depression (Stable) and hallucinations (Hx. Psychosis). Negative for memory loss, substance abuse and suicidal ideas. The patient has insomnia (Stable). The patient is not nervous/anxious.     Blood pressure 121/70, pulse (!) 125, temperature 97.7 F (36.5 C), temperature source Oral, resp. rate 18, height 5\' 4"  (1.626 m), weight 105.7 kg (233 lb), last menstrual period 02/01/2017, SpO2 96 %.Body mass index is 39.99 kg/m.  Have you used any form of tobacco in the last 30 days? (Cigarettes, Smokeless Tobacco, Cigars, and/or Pipes): No  Has this patient used any form of tobacco in  the last 30 days? (Cigarettes, Smokeless Tobacco, Cigars, and/or Pipes)  No  Blood Alcohol level:  Lab Results  Component Value Date   ETH <10 10/31/2016   ETH <5 48/54/6270    Metabolic Disorder Labs:  Lab Results  Component Value Date   HGBA1C 10.9 (H) 11/03/2016   MPG 266.13 11/03/2016   Lab Results  Component Value Date   PROLACTIN 63.7 (H) 11/03/2016   PROLACTIN 10.9 06/13/2006   Lab Results  Component Value Date   CHOL 113 11/03/2016   TRIG 366 (H) 11/03/2016   HDL 19 (L) 11/03/2016   CHOLHDL 5.9 11/03/2016   VLDL 73 (H) 11/03/2016   LDLCALC 21 11/03/2016   LDLCALC 42 04/26/2006   See Psychiatric Specialty Exam and Suicide Risk Assessment completed by Attending Physician prior to discharge.  Discharge destination:  Home  Is patient on multiple antipsychotic therapies at discharge:  Yes,   Do you recommend tapering to monotherapy for antipsychotics?  Yes   Has Patient had three or more failed trials of antipsychotic monotherapy by history:  Yes,   Antipsychotic medications that previously failed include:   1.  Thorazine., 2.  Haldol (Allergic reaction).Marland Kitchen and 3.  Geodon (Allergic reaction).  Recommended Plan for Multiple Antipsychotic Therapies: And because patient has not been able to achieve symptoms control under an antipsychotic monotherapy, she is currently receiving & being discharged on 2 separate antipsychotic medications Thorazine & Seroquel which seem effective at this time. It will benefit patient to continue on these combination antipsychotic therapies as recommended. However, as her symptoms continue to improve, patient may be then titrated down to an antipsychotic monotherapy. This is to prevent the development of metabolic syndrome & other related adverse effects associated with use of multiple antipsychotic therapies.  This has to be done within the discretion & proper judgement of her outpatient provider.  Allergies as of 02/09/2017      Reactions    Fish-derived Products Anaphylaxis   Can only eat [place]   Geodon [ziprasidone Hcl] Other (See Comments)   Face pulls, cant swallow - Locked Jaw   Haldol [haloperidol Lactate] Other (See Comments)   Face pulls, can't swallow - Locked Jaw   Buprenorphine Hcl Hives, Itching, Rash, Other (See Comments)   GI upset   Compazine [prochlorperazine] Other (See Comments)   anxiety and hyperactivity   Morphine And Related Hives, Itching, Rash, Other (See Comments)   GI upset   Toradol [ketorolac Tromethamine] Other (See Comments)   Anxiety and hyperactivity      Medication List    STOP taking these medications   busPIRone 15 MG tablet Commonly known as:  BUSPAR   cyclobenzaprine 5 MG tablet Commonly known as:  FLEXERIL   ibuprofen 800 MG tablet Commonly known as:  ADVIL,MOTRIN   metroNIDAZOLE 0.75 % vaginal gel Commonly known as:  METROGEL VAGINAL   ondansetron 8 MG disintegrating tablet Commonly known as:  ZOFRAN ODT   QUEtiapine 200 MG 24 hr tablet Commonly known as:  SEROQUEL XR Replaced by:  QUEtiapine 100 MG tablet You also have another medication with the same name that you need to continue taking as instructed.   sertraline 100 MG tablet Commonly known as:  ZOLOFT   topiramate 100 MG tablet Commonly known as:  TOPAMAX   traMADol 50 MG tablet Commonly known as:  ULTRAM     TAKE these medications     Indication  cephALEXin 500 MG capsule Commonly known as:  KEFLEX Take 1 capsule (500 mg total) by mouth 3 (three) times daily. For infection What changed:  additional instructions  Indication:  Infection   chlorproMAZINE 50 MG tablet Commonly known as:  THORAZINE Take 1 tablet (50 mg total) by mouth 3 (three) times daily. For agitation/mood control What changed:  additional instructions  Indication:  Mood control   cloNIDine 0.1 MG tablet Commonly known as:  CATAPRES Take 1 tablet (0.1 mg total) by mouth 2 (two) times daily. For high blood pressure What  changed:  additional instructions  Indication:  High Blood Pressure Disorder   dicyclomine 20 MG tablet Commonly known as:  BENTYL Take 1 tablet (20 mg total) by mouth every 8 (eight) hours as needed for spasms.  Indication:  Abdominal spasms   Dulaglutide 1.5 MG/0.5ML Sopn Commonly known as:  TRULICITY Inject 1.5 mg into the skin every Friday. For diabetes management What changed:  additional instructions  Indication:  Type 2 Diabetes   fenofibrate 160 MG tablet Take 1 tablet (160 mg total) by mouth daily. For high Cholesterol Start taking on:  02/10/2017 What changed:    medication strength  how much to take  additional instructions  Indication:  High Amount of Fats in  the Blood, High Amount of Triglycerides in the Blood   gabapentin 400 MG capsule Commonly known as:  NEURONTIN Take 1 capsule (400 mg total) by mouth 3 (three) times daily. For agitation/diabetic neuropathy What changed:  additional instructions  Indication:  Agitation, Diabetic neuropathy   hydrOXYzine 50 MG tablet Commonly known as:  ATARAX/VISTARIL Take 1 tablet (50 mg total) by mouth 3 (three) times daily as needed for anxiety.  Indication:  Feeling Anxious   insulin aspart 100 UNIT/ML injection Commonly known as:  novoLOG Inject 6 Units into the skin 3 (three) times daily with meals. For diabetes management What changed:    how much to take  when to take this  reasons to take this  additional instructions  Indication:  Type 2 Diabetes   insulin detemir 100 UNIT/ML injection Commonly known as:  LEVEMIR Inject 0.5 mLs (50 Units total) into the skin 2 (two) times daily. For diabetes management What changed:    how much to take  additional instructions  Indication:  Type 2 Diabetes   levETIRAcetam 500 MG tablet Commonly known as:  KEPPRA Take 1 tablet (500 mg total) by mouth 2 (two) times daily. For mood stabilization What changed:  additional instructions  Indication:  Mood  stabilization   lisinopril 10 MG tablet Commonly known as:  PRINIVIL,ZESTRIL Take 1 tablet (10 mg total) by mouth daily. For high blood pressure What changed:  additional instructions  Indication:  High Blood Pressure Disorder   pantoprazole 40 MG tablet Commonly known as:  PROTONIX Take 1 tablet (40 mg total) by mouth daily. For acid reflux What changed:  additional instructions  Indication:  Gastroesophageal Reflux Disease   QUEtiapine 50 MG tablet Commonly known as:  SEROQUEL Take 1 tablet (50 mg total) by mouth 2 (two) times daily. For agitation/mood control What changed:    medication strength  how much to take  when to take this  additional instructions  Another medication with the same name was removed. Continue taking this medication, and follow the directions you see here.  Indication:  Agitation/mood control   QUEtiapine 100 MG tablet Commonly known as:  SEROQUEL Take 5 tablets (500 mg total) by mouth at bedtime. For mood control What changed:  You were already taking a medication with the same name, and this prescription was added. Make sure you understand how and when to take each. Replaces:  QUEtiapine 200 MG 24 hr tablet  Indication:  Mood control   traZODone 50 MG tablet Commonly known as:  DESYREL Take 1 tablet (50 mg total) by mouth at bedtime as needed for sleep. What changed:    how much to take  when to take this  reasons to take this  Indication:  Trouble Sleeping        Follow-up recommendations: Activity:  As tolerated Diet: As recommended by your primary care doctor. Keep all scheduled follow-up appointments as recommended.  Comments: Patient is instructed prior to discharge to: Take all medications as prescribed by his/her mental healthcare provider. Report any adverse effects and or reactions from the medicines to his/her outpatient provider promptly. Patient has been instructed & cautioned: To not engage in alcohol and or illegal  drug use while on prescription medicines. In the event of worsening symptoms, patient is instructed to call the crisis hotline, 911 and or go to the nearest ED for appropriate evaluation and treatment of symptoms. To follow-up with his/her primary care provider for your other medical issues, concerns and or health care  needs.   Signed: Lindell Spar, NP, PMHNP, FNP-BC. 02/09/2017, 11:26 AM    Patient seen, Suicide Assessment Completed.  Disposition Plan Reviewed    Tina Saunders is a 32 y/o F with history of schizoaffective disorder bipolar type and Cluster B personality disorder who was admitted on IVC after she had a suicide attempt via overdose. Pt was brought in by ambulance after she called 911 and reported that she had ingested a handful of pills in a suicide attempt. When emergency services arrived on the scene, pt was agitated and told EMS staff she was upset that her suicide attempt was not successful. In the ED she remained agitated and disorganized; she attempted to leave multiple times. Pt was placed on IVC and transferred to [facility] for additional treatment and stabilization.At initial evaluation, she remained too drowsy to participate in the interview, soshe was just initially supported with PRN medications for agitation.She had ongoing episodes of agitationand demanded to leave so that she could go home and kill herself; she required IM geodon to control her agitation. She was placed on 1:1 observation due to her inability to contract for safety on the unit.Pt had improvement of her mood symptoms with use of PRN medications, and she was resumed on home medications with adjustment of her seroquel dosing to higher dose at night and a lower BID dose during the day. She had continued improvement of her symptoms and denied SI/HI/AH/VH.   Today upon evaluation, pt shares that her mood is "good." She is sleeping well. She denies SI/HI/AH/VH. She shares that she feels ready to discharge to home  today. When asked what has changed during her stay, pt replies, "My medications - they are helping more now; I like the way they are now." Pt has consistently elevated blood sugar during her admission despite continuing home insulin orders, and pt was counseled to monitor her dietary intake and also follow up with her ACT team and PCP regarding management of her diabetes, and pt verbalized good understanding. Pt had future orientation about her outpatient follow up and goal of obtaining a service dog with help of her ACT team. She was able to engage in safety planning including plan to return to [facility] or contact emergency services if she feels unable to maintain her own safety or the safety of others. Pt had no further questions, comments, or concerns.  Plan Of Care/Follow-up recommendations:   -Discharge to outpatient level of care  -Schizoaffective disorder, Bipolar Type - Continuethorazine 50mg  TID -Continuegabapentin 400mg  TID - Continueseroquel 500mg  qhs - Continueseroquel 50mg  BID (@0800  and @1400 ) -Continuezoloft 200mg  po qDay -Continuetopamax 50mg  po BID -Continuetrazodone 25mg  po BID  -DMII -Continuehome medications for DMII  -Anxiety -Continueatarax 50mg  po q6h prn anxiety  -Insomnia -Continuetrazodone 50mg  po qhs prn insomnia  Activity:  as tolerated Diet:  normal Tests:  NA Other:  See above for DC plan  Pennelope Bracken, MD

## 2017-02-10 ENCOUNTER — Other Ambulatory Visit: Payer: Self-pay

## 2017-02-10 ENCOUNTER — Emergency Department (HOSPITAL_COMMUNITY)
Admission: EM | Admit: 2017-02-10 | Discharge: 2017-02-10 | Disposition: A | Discharge status: Home / Self Care | Payer: Medicare HMO | Attending: Emergency Medicine | Admitting: Emergency Medicine

## 2017-02-10 DIAGNOSIS — I10 Essential (primary) hypertension: Secondary | ICD-10-CM | POA: Insufficient documentation

## 2017-02-10 DIAGNOSIS — R739 Hyperglycemia, unspecified: Secondary | ICD-10-CM | POA: Diagnosis not present

## 2017-02-10 DIAGNOSIS — R569 Unspecified convulsions: Secondary | ICD-10-CM | POA: Insufficient documentation

## 2017-02-10 DIAGNOSIS — Z794 Long term (current) use of insulin: Secondary | ICD-10-CM | POA: Diagnosis not present

## 2017-02-10 DIAGNOSIS — E1165 Type 2 diabetes mellitus with hyperglycemia: Secondary | ICD-10-CM | POA: Insufficient documentation

## 2017-02-10 DIAGNOSIS — E108 Type 1 diabetes mellitus with unspecified complications: Secondary | ICD-10-CM | POA: Diagnosis not present

## 2017-02-10 DIAGNOSIS — R109 Unspecified abdominal pain: Secondary | ICD-10-CM | POA: Diagnosis not present

## 2017-02-10 DIAGNOSIS — Z79899 Other long term (current) drug therapy: Secondary | ICD-10-CM | POA: Diagnosis not present

## 2017-02-10 DIAGNOSIS — R51 Headache: Secondary | ICD-10-CM | POA: Diagnosis not present

## 2017-02-10 LAB — HEPATIC FUNCTION PANEL
ALT: 25 U/L (ref 14–54)
AST: 39 U/L (ref 15–41)
Albumin: 3.8 g/dL (ref 3.5–5.0)
Alkaline Phosphatase: 124 U/L (ref 38–126)
Total Bilirubin: 0.3 mg/dL (ref 0.3–1.2)
Total Protein: 7.7 g/dL (ref 6.5–8.1)

## 2017-02-10 LAB — URINALYSIS, ROUTINE W REFLEX MICROSCOPIC
Bilirubin Urine: NEGATIVE
Glucose, UA: >=500 mg/dL — AB
HGB URINE DIPSTICK: NEGATIVE
Ketones, ur: NEGATIVE mg/dL
Nitrite: NEGATIVE
Protein, ur: NEGATIVE mg/dL
SPECIFIC GRAVITY, URINE: 1.025 (ref 1.005–1.030)
pH: 6 (ref 5.0–8.0)

## 2017-02-10 LAB — CBC
HCT: 35 % — ABNORMAL LOW (ref 36.0–46.0)
HEMOGLOBIN: 11 g/dL — AB (ref 12.0–15.0)
MCH: 25.1 pg — AB (ref 26.0–34.0)
MCHC: 31.4 g/dL (ref 30.0–36.0)
MCV: 79.9 fL (ref 78.0–100.0)
PLATELETS: 107 10*3/uL — AB (ref 150–400)
RBC: 4.38 MIL/uL (ref 3.87–5.11)
RDW: 16 % — ABNORMAL HIGH (ref 11.5–15.5)
WBC: 3.8 10*3/uL — ABNORMAL LOW (ref 4.0–10.5)

## 2017-02-10 LAB — BASIC METABOLIC PANEL
Anion gap: 11 (ref 5–15)
BUN: 6 mg/dL (ref 6–20)
CALCIUM: 8.5 mg/dL — AB (ref 8.9–10.3)
CO2: 20 mmol/L — AB (ref 22–32)
CREATININE: 0.55 mg/dL (ref 0.44–1.00)
Chloride: 99 mmol/L — ABNORMAL LOW (ref 101–111)
GFR calc Af Amer: >60 mL/min (ref >60)
GFR calc non Af Amer: >60 mL/min (ref >60)
GLUCOSE: 564 mg/dL — AB (ref 65–99)
Potassium: 4.2 mmol/L (ref 3.5–5.1)
Sodium: 130 mmol/L — ABNORMAL LOW (ref 135–145)

## 2017-02-10 LAB — I-STAT BETA HCG BLOOD, ED (MC, WL, AP ONLY)

## 2017-02-10 LAB — CBG MONITORING, ED
GLUCOSE-CAPILLARY: 523 mg/dL — AB (ref 65–99)
GLUCOSE-CAPILLARY: 371 mg/dL — AB (ref 65–99)

## 2017-02-10 LAB — LIPASE, BLOOD: LIPASE: 47 U/L (ref 11–51)

## 2017-02-10 MED ORDER — SODIUM CHLORIDE 0.9 % IV BOLUS (SEPSIS)
1000.0000 mL | Freq: Once | INTRAVENOUS | Status: AC
  Administered 2017-02-10: 1000 mL via INTRAVENOUS

## 2017-02-10 MED ORDER — INSULIN ASPART 100 UNIT/ML ~~LOC~~ SOLN
10.0000 [IU] | Freq: Once | SUBCUTANEOUS | Status: AC
  Administered 2017-02-10: 10 [IU] via SUBCUTANEOUS
  Filled 2017-02-10: qty 1

## 2017-02-10 MED ORDER — SODIUM CHLORIDE 0.9 % IV SOLN
1500.0000 mg | Freq: Once | INTRAVENOUS | Status: AC
  Administered 2017-02-10: 1500 mg via INTRAVENOUS
  Filled 2017-02-10: qty 15

## 2017-02-10 NOTE — ED Provider Notes (Signed)
[facility] DEPT Provider Note   CSN: [id number] Arrival date & time: 02/10/17  0111     History   Chief Complaint Chief Complaint  Patient presents with  . Hyperglycemia    HPI Tina Saunders is a 32 y.o. female with a hx of IDDM presents to the Emergency Department complaining of unknown onset hyperglycemia.  Pt reports she awoke on the floor after a presumed seizure this evening.  She reports this is common after a seizure.  She reports her seizures are usually triggered by high blood sugars and when she checked her CBG at home afterwards it was 584.  She then called 911. Pt reports she has not checked her blood sugar all day because she felt fine. Pt reports she often takes her insulin without checking her blood sugar.  Nothing seems to make her symptoms better.  Pt reports she drank a 2L of CHeerwine and ate take out food today including macaroni.   Pt denies fever, chills, neck pain, chest pain, SOB, vomiting, diarrhea, weakness.  Pt reports associated sweet taste in her mouth, headache and generalized abd pain.  Pt reports taking 22 units of Novolog just before calling EMS.  Pt reports she was discharged from [facility] today after being hospitalized approx 1 week. She states she was being given her other medications, but not her Keppra.     The history is provided by the patient and medical records. No language interpreter was used.    Past Medical History:  Diagnosis Date  . Anxiety   . Bipolar 1 disorder ([place])   . Cancer of abdominal wall   . Depression   . Diabetes mellitus without complication ([place])   . Hypertension   . Obesity   . Obesity   . Polycystic ovarian syndrome 07/01/2011   Patient report  . 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 Schizophrenia [facility])     Patient Active Problem List   Diagnosis Date Noted  . Seizures ([place]) 12/11/2016  . Acute lower UTI 12/11/2016  . DM2 (diabetes mellitus, type 2) ([place]) 12/11/2016  . Uncontrolled diabetes mellitus  ([place]) 11/03/2016  . Schizoaffective disorder, bipolar type ([place]) 11/02/2016  . Cluster B personality disorder ([place]) 11/02/2016  . Schizoaffective disorder, mixed type ([place]) 05/30/2014  . Suicidal ideation   . 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 Headache 04/15/2013  . HTN (hypertension) 04/15/2013  . Post traumatic stress disorder 12/07/2011  . CAP (community acquired pneumonia) 08/27/2011  . Chest pain 08/26/2011  . SOB (shortness of breath) 08/26/2011  . Fever 08/26/2011  . Hypokalemia 08/26/2011  . PSVT (paroxysmal supraventricular tachycardia) ([place]) 08/26/2011  . ADHD 09/23/2007  . EPIGASTRIC PAIN 09/23/2007  . Obesity, unspecified 07/30/2007  . DEPRESSION 07/30/2007  . SLEEP DISORDER 07/30/2007  . IMPAIRED FASTING GLUCOSE 07/30/2007  . FATIGUE 11/21/2006  . ABNORMAL FINDINGS, ELEVATED BP W/O HTN 11/21/2006  . METRORRHAGIA 06/13/2006  . DISORDER, MENSTRUAL NEC 06/13/2006  . DIZZINESS 06/13/2006  . POLYCYSTIC OVARIAN DISEASE 04/25/2006  . AMENORRHEA, SECONDARY 04/20/2006  . ACNE, MILD 04/20/2006  . ABDOMINAL PAIN 04/20/2006    Past Surgical History:  Procedure Laterality Date  . CHOLECYSTECTOMY    . HERNIA REPAIR    . Ovarian Cyst Excision    . VARICOSE VEIN SURGERY      OB History    Gravida Para Term Preterm AB Living   0             SAB TAB Ectopic Multiple Live Births  Home Medications    Prior to Admission medications   Medication Sig Start Date End Date Taking? Authorizing Provider  cephALEXin (KEFLEX) 500 MG capsule Take 1 capsule (500 mg total) by mouth 3 (three) times daily. For infection 02/09/17  Yes Nwoko, Herbert Pun I, NP  chlorproMAZINE (THORAZINE) 50 MG tablet Take 1 tablet (50 mg total) by mouth 3 (three) times daily. For agitation/mood control 02/09/17  Yes Lindell Spar I, NP  cloNIDine (CATAPRES) 0.1 MG tablet Take 1 tablet (0.1 mg total) by mouth 2 (two) times daily. For high blood pressure 02/09/17  Yes Nwoko, Agnes I, NP    dicyclomine (BENTYL) 20 MG tablet Take 1 tablet (20 mg total) by mouth every 8 (eight) hours as needed for spasms. 02/09/17  Yes Lindell Spar I, NP  Dulaglutide (TRULICITY) 1.5 LF/8.1OF SOPN Inject 1.5 mg into the skin every Friday. For diabetes management 02/09/17  Yes Lindell Spar I, NP  fenofibrate 160 MG tablet Take 1 tablet (160 mg total) by mouth daily. For high Cholesterol 02/10/17  Yes Lindell Spar I, NP  gabapentin (NEURONTIN) 400 MG capsule Take 1 capsule (400 mg total) by mouth 3 (three) times daily. For agitation/diabetic neuropathy 02/09/17  Yes Lindell Spar I, NP  hydrOXYzine (ATARAX/VISTARIL) 50 MG tablet Take 1 tablet (50 mg total) by mouth 3 (three) times daily as needed for anxiety. 02/09/17  Yes Lindell Spar I, NP  insulin aspart (NOVOLOG) 100 UNIT/ML injection Inject 6 Units into the skin 3 (three) times daily with meals. For diabetes management 02/09/17  Yes Lindell Spar I, NP  insulin detemir (LEVEMIR) 100 UNIT/ML injection Inject 0.5 mLs (50 Units total) into the skin 2 (two) times daily. For diabetes management 02/09/17  Yes Lindell Spar I, NP  levETIRAcetam (KEPPRA) 500 MG tablet Take 1 tablet (500 mg total) by mouth 2 (two) times daily. For mood stabilization 02/09/17  Yes Nwoko, Herbert Pun I, NP  lisinopril (PRINIVIL,ZESTRIL) 10 MG tablet Take 1 tablet (10 mg total) by mouth daily. For high blood pressure 02/09/17  Yes Nwoko, Agnes I, NP  pantoprazole (PROTONIX) 40 MG tablet Take 1 tablet (40 mg total) by mouth daily. For acid reflux 02/09/17  Yes Lindell Spar I, NP  QUEtiapine (SEROQUEL) 100 MG tablet Take 5 tablets (500 mg total) by mouth at bedtime. For mood control 02/09/17  Yes Nwoko, Herbert Pun I, NP  QUEtiapine (SEROQUEL) 50 MG tablet Take 1 tablet (50 mg total) by mouth 2 (two) times daily. For agitation/mood control 02/09/17  Yes Lindell Spar I, NP  traZODone (DESYREL) 50 MG tablet Take 1 tablet (50 mg total) by mouth at bedtime as needed for sleep. 02/09/17  Yes Encarnacion Slates, NP     Family History Family History  Problem Relation Age of Onset  . Coronary artery disease Maternal Grandmother   . Diabetes type II Maternal Grandmother   . Cancer Maternal Grandmother   . Hypertension Mother   . Hypertension Father     Social History Social History   Tobacco Use  . Smoking status: Never Smoker  . Smokeless tobacco: Never Used  Substance Use Topics  . Alcohol use: No  . Drug use: No     Allergies   Fish-derived products; Geodon [ziprasidone hcl]; Haldol [haloperidol lactate]; Buprenorphine hcl; Compazine [prochlorperazine]; Morphine and related; and Toradol [ketorolac tromethamine]   Review of Systems Review of Systems  Constitutional: Negative for appetite change, diaphoresis, fatigue, fever and unexpected weight change.  HENT: Negative for mouth sores.   Eyes: Negative for  visual disturbance.  Respiratory: Negative for cough, chest tightness, shortness of breath and wheezing.   Cardiovascular: Negative for chest pain.  Gastrointestinal: Positive for abdominal pain and nausea. Negative for constipation, diarrhea and vomiting.  Endocrine: Positive for polydipsia and polyuria. Negative for polyphagia.  Genitourinary: Negative for dysuria, frequency, hematuria and urgency.  Musculoskeletal: Negative for back pain and neck stiffness.  Skin: Negative for rash.  Allergic/Immunologic: Negative for immunocompromised state.  Neurological: Positive for seizures and headaches. Negative for syncope and light-headedness.  Hematological: Does not bruise/bleed easily.  Psychiatric/Behavioral: Negative for sleep disturbance. The patient is not nervous/anxious.      Physical Exam Updated Vital Signs BP (!) 132/91 (BP Location: Right Arm)   Pulse (!) 127   Temp 98.6 F (37 C) (Oral)   Resp (!) 22   Ht 5\' 4"  (1.626 m)   Wt 104.3 kg (230 lb)   LMP 02/01/2017 Comment: HCG < 5 02-03-17  SpO2 98%   BMI 39.48 kg/m   Physical Exam  Constitutional: She appears  well-developed and well-nourished. No distress.  Awake, alert, nontoxic appearance  HENT:  Head: Normocephalic and atraumatic.  Mouth/Throat: Oropharynx is clear and moist. No oropharyngeal exudate.  No oral trauma No contusions to the head  Eyes: Conjunctivae are normal. No scleral icterus.  Neck: Normal range of motion. Neck supple.  Cardiovascular: Normal rate, regular rhythm and intact distal pulses.  Pulmonary/Chest: Effort normal and breath sounds normal. Tachypnea noted. No respiratory distress. She has no decreased breath sounds. She has no wheezes.  Equal chest expansion  Abdominal: Soft. Bowel sounds are normal. She exhibits no mass. There is tenderness ( Minimal, generalized). There is no rebound and no guarding.  Exam limited by body habitus  Musculoskeletal: Normal range of motion. She exhibits no edema.  Neurological: She is alert.  Speech is clear and goal oriented Moves extremities without ataxia  Skin: Skin is warm and dry. She is not diaphoretic.  Psychiatric: She has a normal mood and affect.  Nursing note and vitals reviewed.    ED Treatments / Results  Labs (all labs ordered are listed, but only abnormal results are displayed) Labs Reviewed  BASIC METABOLIC PANEL - Abnormal; Notable for the following components:      Result Value   Sodium 130 (*)    Chloride 99 (*)    CO2 20 (*)    Glucose, Bld 564 (*)    Calcium 8.5 (*)    All other components within normal limits  CBC - Abnormal; Notable for the following components:   WBC 3.8 (*)    Hemoglobin 11.0 (*)    HCT 35.0 (*)    MCH 25.1 (*)    RDW 16.0 (*)    Platelets 107 (*)    All other components within normal limits  URINALYSIS, ROUTINE W REFLEX MICROSCOPIC - Abnormal; Notable for the following components:   Color, Urine STRAW (*)    Glucose, UA >=500 (*)    Leukocytes, UA TRACE (*)    Bacteria, UA RARE (*)    Squamous Epithelial / LPF 0-5 (*)    All other components within normal limits    HEPATIC FUNCTION PANEL - Abnormal; Notable for the following components:   Bilirubin, Direct <0.1 (*)    All other components within normal limits  CBG MONITORING, ED - Abnormal; Notable for the following components:   Glucose-Capillary 523 (*)    All other components within normal limits  CBG MONITORING, ED - Abnormal; Notable for  the following components:   Glucose-Capillary 371 (*)    All other components within normal limits  LIPASE, BLOOD  I-STAT BETA HCG BLOOD, ED ([facility], [facility], [facility] ONLY)  CBG MONITORING, ED  CBG MONITORING, ED    EKG  EKG Interpretation None       Radiology No results found.  Procedures Procedures (including critical care time)  Medications Ordered in ED Medications  sodium chloride 0.9 % bolus 1,000 mL (0 mLs Intravenous Stopped 02/10/17 0411)  levETIRAcetam (KEPPRA) 1,500 mg in sodium chloride 0.9 % 100 mL IVPB (0 mg Intravenous Stopped 02/10/17 0411)  insulin aspart (novoLOG) injection 10 Units (10 Units Subcutaneous Given 02/10/17 0354)  sodium chloride 0.9 % bolus 1,000 mL (0 mLs Intravenous Stopped 02/10/17 0528)     Initial Impression / Assessment and Plan / ED Course  I have reviewed the triage vital signs and the nursing notes.  Pertinent labs & imaging results that were available during my care of the patient were reviewed by me and considered in my medical decision making (see chart for details).  Clinical Course as of Feb 11 635  Sat Feb 10, 2017  0238 Glucose-Capillary: (!!) 523 [HM]  0238 Tachycardic Pulse Rate: (!) 127 [HM]  0238 Tachypnea.  [place] breathing noted. Resp: (!) 22 [HM]  [id number] Patient is well-appearing and reports her abdominal pain has resolved.     [HM]    Clinical Course User Index [HM] Aireonna Bauer, Jarrett Soho, [place]    Patient presents with report of seizure and hyperglycemia.  Labs are without evidence of DKA.  Patient has been given fluids and insulin with improvement in her blood sugar to 371.  I suspect the  patient's blood sugar typically runs around this level.  She reports her abdominal pain has resolved completely.  She has tolerated p.o. fluids without emesis here.  No additional seizure activity.  Patient is well-appearing.  Her tachycardia has improved.  Her respiratory rate has also improved.  She reports she feels well enough to go home.  Requested that she continue taking her Keppra and insulin.  Long discussion with patient about the importance of blood sugar management including checking of her blood sugar before administering insulin.  Also discussed diet changes including decreasing her sugar intake.  Patient states understanding.  She will follow with her primary care doctor this week.  Also discussed reasons to return immediately to the emergency department.  Final Clinical Impressions(s) / ED Diagnoses   Final diagnoses:  Hyperglycemia  Seizure [facility])    ED Discharge Orders    None       Loni Muse Gwenlyn Perking 91/50/56 [id number]    Delora Fuel, MD 80/16/55 [phone]

## 2017-02-10 NOTE — Discharge Instructions (Signed)
1. Medications: usual home medications including insulin and Keppra 2. Treatment: rest, drink plenty of fluids, refrain from foods high in sugar 3. Follow Up: Please followup with your primary doctor in 2 days for discussion of your diagnoses and further evaluation after today's visit; if you do not have a primary care doctor use the resource guide provided to find one; Please return to the ER for additional seizures, continued increased urination, very high blood sugars or other concerns.

## 2017-02-10 NOTE — ED Notes (Signed)
Bed: [facility] Expected date:  Expected time:  Means of arrival: Ambulance Comments:

## 2017-02-10 NOTE — ED Notes (Signed)
Date and time results received: 02/10/17 0256 (use smartphrase ".now" to insert current time)  Test: Glucose Critical Value: 564  Name of Provider Notified: Jarrett Soho PA  Orders Received? Or Actions Taken?: Action Taken

## 2017-02-10 NOTE — ED Triage Notes (Signed)
Patient got discharged from [facility] this am. Patient stated she has not had kepra or insulin for about 3 days. Patient states she woke up on the floor after she took 22 of novolog. Patient has a sugar of 585.

## 2017-02-12 LAB — CBG MONITORING, ED: Glucose-Capillary: 402 mg/dL — ABNORMAL HIGH (ref 65–99)

## 2017-02-14 DIAGNOSIS — I10 Essential (primary) hypertension: Secondary | ICD-10-CM | POA: Diagnosis not present

## 2017-02-14 DIAGNOSIS — E119 Type 2 diabetes mellitus without complications: Secondary | ICD-10-CM | POA: Diagnosis not present

## 2017-02-14 DIAGNOSIS — E785 Hyperlipidemia, unspecified: Secondary | ICD-10-CM | POA: Diagnosis not present

## 2017-02-15 ENCOUNTER — Encounter (HOSPITAL_COMMUNITY): Payer: Self-pay | Admitting: Anesthesiology

## 2017-02-15 ENCOUNTER — Telehealth: Payer: Self-pay | Admitting: Gastroenterology

## 2017-02-15 ENCOUNTER — Ambulatory Visit (HOSPITAL_COMMUNITY): Admission: RE | Admit: 2017-02-15 | Payer: Medicare HMO | Source: Ambulatory Visit | Admitting: Gastroenterology

## 2017-02-15 SURGERY — COLONOSCOPY WITH PROPOFOL
Anesthesia: Monitor Anesthesia Care

## 2017-02-15 NOTE — Telephone Encounter (Signed)
She cancelled her colonoscopy today.  Please contact her, see if she wants to rescheduled ([facility], next available with MAC sedation), thanks

## 2017-02-15 NOTE — Telephone Encounter (Signed)
Left message on machine to call back  

## 2017-02-15 NOTE — Anesthesia Preprocedure Evaluation (Deleted)
Anesthesia Evaluation  Patient identified by MRN, date of birth, ID band Patient awake    Reviewed: Allergy & Precautions, NPO status , Patient's Chart, lab work & pertinent test results  Airway Mallampati: II  TM Distance: >3 FB Neck ROM: Full    Dental  (+) Dental Advisory Given   Pulmonary neg pulmonary ROS,    Pulmonary exam normal breath sounds clear to auscultation       Cardiovascular hypertension, Pt. on medications Normal cardiovascular exam+ dysrhythmias Supra Ventricular Tachycardia  Rhythm:Regular Rate:Normal     Neuro/Psych  Headaches, Seizures -,  Anxiety Depression Bipolar Disorder Schizophrenia    GI/Hepatic negative GI ROS, Neg liver ROS,   Endo/Other  diabetesMorbid obesity  Renal/GU negative Renal ROS  negative genitourinary   Musculoskeletal negative musculoskeletal ROS (+)   Abdominal   Peds  Hematology  (+) anemia , Thrombocytopenia   Anesthesia Other Findings Hx rhabdosarcoma; hyponatremia  Reproductive/Obstetrics PCOS                             Anesthesia Physical Anesthesia Plan  ASA: III  Anesthesia Plan: MAC   Post-op Pain Management:    Induction: Intravenous  PONV Risk Score and Plan: Propofol infusion and Treatment may vary due to age or medical condition  Airway Management Planned: Nasal Cannula  Additional Equipment:   Intra-op Plan:   Post-operative Plan:   Informed Consent: I have reviewed the patients History and Physical, chart, labs and discussed the procedure including the risks, benefits and alternatives for the proposed anesthesia with the patient or authorized representative who has indicated his/her understanding and acceptance.   Dental advisory given  Plan Discussed with: CRNA  Anesthesia Plan Comments:         Anesthesia Quick Evaluation

## 2017-02-17 ENCOUNTER — Encounter (HOSPITAL_COMMUNITY): Payer: Self-pay

## 2017-02-17 ENCOUNTER — Emergency Department (HOSPITAL_COMMUNITY): Payer: Medicare HMO

## 2017-02-17 ENCOUNTER — Other Ambulatory Visit: Payer: Self-pay

## 2017-02-17 ENCOUNTER — Emergency Department (HOSPITAL_COMMUNITY)
Admission: EM | Admit: 2017-02-17 | Discharge: 2017-02-17 | Disposition: A | Discharge status: Home / Self Care | Payer: Medicare HMO | Attending: Emergency Medicine | Admitting: Emergency Medicine

## 2017-02-17 DIAGNOSIS — R404 Transient alteration of awareness: Secondary | ICD-10-CM | POA: Diagnosis not present

## 2017-02-17 DIAGNOSIS — Z79899 Other long term (current) drug therapy: Secondary | ICD-10-CM | POA: Diagnosis not present

## 2017-02-17 DIAGNOSIS — R1031 Right lower quadrant pain: Secondary | ICD-10-CM | POA: Diagnosis present

## 2017-02-17 DIAGNOSIS — R42 Dizziness and giddiness: Secondary | ICD-10-CM | POA: Diagnosis not present

## 2017-02-17 DIAGNOSIS — Z85028 Personal history of other malignant neoplasm of stomach: Secondary | ICD-10-CM | POA: Insufficient documentation

## 2017-02-17 DIAGNOSIS — K760 Fatty (change of) liver, not elsewhere classified: Secondary | ICD-10-CM | POA: Diagnosis not present

## 2017-02-17 DIAGNOSIS — N390 Urinary tract infection, site not specified: Secondary | ICD-10-CM | POA: Diagnosis not present

## 2017-02-17 DIAGNOSIS — Z794 Long term (current) use of insulin: Secondary | ICD-10-CM | POA: Diagnosis not present

## 2017-02-17 DIAGNOSIS — I10 Essential (primary) hypertension: Secondary | ICD-10-CM | POA: Insufficient documentation

## 2017-02-17 DIAGNOSIS — R739 Hyperglycemia, unspecified: Secondary | ICD-10-CM

## 2017-02-17 DIAGNOSIS — E1165 Type 2 diabetes mellitus with hyperglycemia: Secondary | ICD-10-CM | POA: Insufficient documentation

## 2017-02-17 LAB — BASIC METABOLIC PANEL
Anion gap: 12 (ref 5–15)
CO2: 21 mmol/L — ABNORMAL LOW (ref 22–32)
CREATININE: 0.54 mg/dL (ref 0.44–1.00)
Calcium: 8.5 mg/dL — ABNORMAL LOW (ref 8.9–10.3)
Chloride: 97 mmol/L — ABNORMAL LOW (ref 101–111)
GFR calc Af Amer: >60 mL/min (ref >60)
GLUCOSE: 579 mg/dL — AB (ref 65–99)
POTASSIUM: 3.7 mmol/L (ref 3.5–5.1)
SODIUM: 130 mmol/L — AB (ref 135–145)

## 2017-02-17 LAB — CBC
HEMATOCRIT: 31.9 % — AB (ref 36.0–46.0)
Hemoglobin: 10.1 g/dL — ABNORMAL LOW (ref 12.0–15.0)
MCH: 25.3 pg — ABNORMAL LOW (ref 26.0–34.0)
MCHC: 31.7 g/dL (ref 30.0–36.0)
MCV: 79.9 fL (ref 78.0–100.0)
PLATELETS: 95 10*3/uL — AB (ref 150–400)
RBC: 3.99 MIL/uL (ref 3.87–5.11)
RDW: 16.1 % — ABNORMAL HIGH (ref 11.5–15.5)
WBC: 3.2 10*3/uL — AB (ref 4.0–10.5)

## 2017-02-17 LAB — URINALYSIS, ROUTINE W REFLEX MICROSCOPIC
BACTERIA UA: NONE SEEN
Bilirubin Urine: NEGATIVE
Glucose, UA: >=500 mg/dL — AB
Hgb urine dipstick: NEGATIVE
Ketones, ur: NEGATIVE mg/dL
NITRITE: NEGATIVE
PH: 6 (ref 5.0–8.0)
Protein, ur: NEGATIVE mg/dL
SPECIFIC GRAVITY, URINE: 1.029 (ref 1.005–1.030)

## 2017-02-17 LAB — I-STAT BETA HCG BLOOD, ED (MC, WL, AP ONLY)

## 2017-02-17 LAB — BLOOD GAS, VENOUS
Acid-base deficit: 2 mmol/L (ref 0.0–2.0)
Bicarbonate: 19.3 mmol/L — ABNORMAL LOW (ref 20.0–28.0)
O2 SAT: 99.6 %
PCO2 VEN: 23.3 mmHg — AB (ref 44.0–60.0)
PO2 VEN: 193 mmHg — AB (ref 32.0–45.0)
Patient temperature: 98.6
pH, Ven: 7.529 — ABNORMAL HIGH (ref 7.250–7.430)

## 2017-02-17 LAB — CBG MONITORING, ED
GLUCOSE-CAPILLARY: 565 mg/dL — AB (ref 65–99)
GLUCOSE-CAPILLARY: 350 mg/dL — AB (ref 65–99)

## 2017-02-17 MED ORDER — MORPHINE SULFATE (PF) 4 MG/ML IV SOLN
4.0000 mg | Freq: Once | INTRAVENOUS | Status: AC
  Administered 2017-02-17: 4 mg via INTRAVENOUS
  Filled 2017-02-17: qty 1

## 2017-02-17 MED ORDER — SODIUM CHLORIDE 0.9 % IV SOLN
INTRAVENOUS | Status: DC
  Administered 2017-02-17: 13:00:00 via INTRAVENOUS

## 2017-02-17 MED ORDER — CEPHALEXIN 500 MG PO CAPS: 500.0000 mg | ORAL_CAPSULE | Freq: Four times a day (QID) | ORAL | 0 refills | Status: DC

## 2017-02-17 MED ORDER — DIPHENHYDRAMINE HCL 50 MG/ML IJ SOLN
12.5000 mg | Freq: Once | INTRAMUSCULAR | Status: AC
  Administered 2017-02-17: 12.5 mg via INTRAVENOUS
  Filled 2017-02-17: qty 1

## 2017-02-17 MED ORDER — SODIUM CHLORIDE 0.9 % IV BOLUS (SEPSIS)
2000.0000 mL | Freq: Once | INTRAVENOUS | Status: AC
  Administered 2017-02-17: 2000 mL via INTRAVENOUS

## 2017-02-17 MED ORDER — IOPAMIDOL (ISOVUE-300) INJECTION 61%
INTRAVENOUS | Status: AC
  Administered 2017-02-17: 100 mL
  Filled 2017-02-17: qty 100

## 2017-02-17 MED ORDER — METOCLOPRAMIDE HCL 5 MG/ML IJ SOLN
5.0000 mg | Freq: Once | INTRAMUSCULAR | Status: AC
  Administered 2017-02-17: 5 mg via INTRAVENOUS
  Filled 2017-02-17: qty 2

## 2017-02-17 NOTE — ED Triage Notes (Signed)
Patient from home with c/o dizziness and blurred vision. Pt also found to have blood sugar of 565 per ems. Pt also c/o nausea but no vomiting.

## 2017-02-17 NOTE — ED Provider Notes (Signed)
[facility] DEPT Provider Note   CSN: [id number] Arrival date & time: 02/17/17  1151     History   Chief Complaint No chief complaint on file.   HPI CALDONIA LEAP is a 32 y.o. female.  32 year old female presents with 3 days of abdominal pain localized to right lower quadrant with associated hyper glycemia.  Patient states her blood sugars have been running in the 500s and that she is not changed her insulin medications recently.  Denies any changes to her diet.  States that she has been compliant with a diabetic diet.  Pain is been persistent and sharp and not associated with any vaginal bleeding or discharge.  No dysuria or hematuria.  She has noted some polyuria without polydipsia.pt called ems and transported here      Past Medical History:  Diagnosis Date  . Anxiety   . Bipolar 1 disorder ([place])   . Cancer of abdominal wall   . Depression   . Diabetes mellitus without complication ([place])   . Hypertension   . Obesity   . Obesity   . Polycystic ovarian syndrome 07/01/2011   Patient report  . 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 Schizophrenia [facility])     Patient Active Problem List   Diagnosis Date Noted  . Seizures ([place]) 12/11/2016  . Acute lower UTI 12/11/2016  . DM2 (diabetes mellitus, type 2) ([place]) 12/11/2016  . Uncontrolled diabetes mellitus ([place]) 11/03/2016  . Schizoaffective disorder, bipolar type ([place]) 11/02/2016  . Cluster B personality disorder ([place]) 11/02/2016  . Schizoaffective disorder, mixed type ([place]) 05/30/2014  . Suicidal ideation   . 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 Headache 04/15/2013  . HTN (hypertension) 04/15/2013  . Post traumatic stress disorder 12/07/2011  . CAP (community acquired pneumonia) 08/27/2011  . Chest pain 08/26/2011  . SOB (shortness of breath) 08/26/2011  . Fever 08/26/2011  . Hypokalemia 08/26/2011  . PSVT (paroxysmal supraventricular tachycardia) ([place]) 08/26/2011  . ADHD 09/23/2007  . EPIGASTRIC  PAIN 09/23/2007  . Obesity, unspecified 07/30/2007  . DEPRESSION 07/30/2007  . SLEEP DISORDER 07/30/2007  . IMPAIRED FASTING GLUCOSE 07/30/2007  . FATIGUE 11/21/2006  . ABNORMAL FINDINGS, ELEVATED BP W/O HTN 11/21/2006  . METRORRHAGIA 06/13/2006  . DISORDER, MENSTRUAL NEC 06/13/2006  . DIZZINESS 06/13/2006  . POLYCYSTIC OVARIAN DISEASE 04/25/2006  . AMENORRHEA, SECONDARY 04/20/2006  . ACNE, MILD 04/20/2006  . ABDOMINAL PAIN 04/20/2006    Past Surgical History:  Procedure Laterality Date  . CHOLECYSTECTOMY    . HERNIA REPAIR    . Ovarian Cyst Excision    . VARICOSE VEIN SURGERY      OB History    Gravida Para Term Preterm AB Living   0             SAB TAB Ectopic Multiple Live Births                   Home Medications    Prior to Admission medications   Medication Sig Start Date End Date Taking? Authorizing Provider  cephALEXin (KEFLEX) 500 MG capsule Take 1 capsule (500 mg total) by mouth 3 (three) times daily. For infection 02/09/17   Lindell Spar I, NP  chlorproMAZINE (THORAZINE) 50 MG tablet Take 1 tablet (50 mg total) by mouth 3 (three) times daily. For agitation/mood control 02/09/17   Lindell Spar I, NP  cloNIDine (CATAPRES) 0.1 MG tablet Take 1 tablet (0.1 mg total) by mouth 2 (two) times daily. For high blood pressure 02/09/17  Lindell Spar I, NP  dicyclomine (BENTYL) 20 MG tablet Take 1 tablet (20 mg total) by mouth every 8 (eight) hours as needed for spasms. 02/09/17   Lindell Spar I, NP  Dulaglutide (TRULICITY) 1.5 SW/1.0XN SOPN Inject 1.5 mg into the skin every Friday. For diabetes management 02/09/17   Lindell Spar I, NP  fenofibrate 160 MG tablet Take 1 tablet (160 mg total) by mouth daily. For high Cholesterol 02/10/17   Lindell Spar I, NP  gabapentin (NEURONTIN) 400 MG capsule Take 1 capsule (400 mg total) by mouth 3 (three) times daily. For agitation/diabetic neuropathy 02/09/17   Lindell Spar I, NP  hydrOXYzine (ATARAX/VISTARIL) 50 MG tablet Take 1 tablet (50  mg total) by mouth 3 (three) times daily as needed for anxiety. 02/09/17   Lindell Spar I, NP  insulin aspart (NOVOLOG) 100 UNIT/ML injection Inject 6 Units into the skin 3 (three) times daily with meals. For diabetes management 02/09/17   Lindell Spar I, NP  insulin detemir (LEVEMIR) 100 UNIT/ML injection Inject 0.5 mLs (50 Units total) into the skin 2 (two) times daily. For diabetes management 02/09/17   Lindell Spar I, NP  levETIRAcetam (KEPPRA) 500 MG tablet Take 1 tablet (500 mg total) by mouth 2 (two) times daily. For mood stabilization 02/09/17   Lindell Spar I, NP  lisinopril (PRINIVIL,ZESTRIL) 10 MG tablet Take 1 tablet (10 mg total) by mouth daily. For high blood pressure 02/09/17   Nwoko, Herbert Pun I, NP  pantoprazole (PROTONIX) 40 MG tablet Take 1 tablet (40 mg total) by mouth daily. For acid reflux 02/09/17   Lindell Spar I, NP  QUEtiapine (SEROQUEL) 100 MG tablet Take 5 tablets (500 mg total) by mouth at bedtime. For mood control 02/09/17   Lindell Spar I, NP  QUEtiapine (SEROQUEL) 50 MG tablet Take 1 tablet (50 mg total) by mouth 2 (two) times daily. For agitation/mood control 02/09/17   Lindell Spar I, NP  traZODone (DESYREL) 50 MG tablet Take 1 tablet (50 mg total) by mouth at bedtime as needed for sleep. 02/09/17   Encarnacion Slates, NP    Family History Family History  Problem Relation Age of Onset  . Coronary artery disease Maternal Grandmother   . Diabetes type II Maternal Grandmother   . Cancer Maternal Grandmother   . Hypertension Mother   . Hypertension Father     Social History Social History   Tobacco Use  . Smoking status: Never Smoker  . Smokeless tobacco: Never Used  Substance Use Topics  . Alcohol use: No  . Drug use: No     Allergies   Fish-derived products; Geodon [ziprasidone hcl]; Haldol [haloperidol lactate]; Buprenorphine hcl; Compazine [prochlorperazine]; Morphine and related; and Toradol [ketorolac tromethamine]   Review of Systems Review of Systems  All  other systems reviewed and are negative.    Physical Exam Updated Vital Signs BP (!) 144/80   Pulse 98   Temp 98.1 F (36.7 C) (Oral)   Resp 19   LMP 02/01/2017 Comment: HCG < 5 02-03-17  SpO2 100%   Physical Exam  Constitutional: She is oriented to person, place, and time. She appears well-developed and well-nourished.  Non-toxic appearance. No distress.  HENT:  Head: Normocephalic and atraumatic.  Eyes: Conjunctivae, EOM and lids are normal. Pupils are equal, round, and reactive to light.  Neck: Normal range of motion. Neck supple. No tracheal deviation present. No thyroid mass present.  Cardiovascular: Normal rate, regular rhythm and normal heart sounds. Exam reveals no gallop.  No murmur heard. Pulmonary/Chest: Effort normal and breath sounds normal. No stridor. No respiratory distress. She has no decreased breath sounds. She has no wheezes. She has no rhonchi. She has no rales.  Abdominal: Soft. Normal appearance and bowel sounds are normal. She exhibits no distension. There is tenderness in the right lower quadrant. There is guarding. There is no rigidity, no rebound and no CVA tenderness.    Musculoskeletal: Normal range of motion. She exhibits no edema or tenderness.  Neurological: She is alert and oriented to person, place, and time. She has normal strength. No cranial nerve deficit or sensory deficit. GCS eye subscore is 4. GCS verbal subscore is 5. GCS motor subscore is 6.  Skin: Skin is warm and dry. No abrasion and no rash noted.  Psychiatric: Her speech is normal and behavior is normal. Her affect is blunt.  Nursing note and vitals reviewed.    ED Treatments / Results  Labs (all labs ordered are listed, but only abnormal results are displayed) Labs Reviewed  CBG MONITORING, ED - Abnormal; Notable for the following components:      Result Value   Glucose-Capillary 565 (*)    All other components within normal limits  BASIC METABOLIC PANEL  CBC  URINALYSIS,  ROUTINE W REFLEX MICROSCOPIC  BLOOD GAS, VENOUS  CBG MONITORING, ED  I-STAT BETA HCG BLOOD, ED ([facility], [facility], [facility] ONLY)    EKG  EKG Interpretation None       Radiology No results found.  Procedures Procedures (including critical care time)  Medications Ordered in ED Medications  0.9 %  sodium chloride infusion ( Intravenous New Bag/Given 02/17/17 1250)  morphine 4 MG/ML injection 4 mg (not administered)  diphenhydrAMINE (BENADRYL) injection 12.5 mg (not administered)  metoCLOPramide (REGLAN) injection 5 mg (not administered)  sodium chloride 0.9 % bolus 2,000 mL (2,000 mLs Intravenous New Bag/Given 02/17/17 1250)     Initial Impression / Assessment and Plan / ED Course  I have reviewed the triage vital signs and the nursing notes.  Pertinent labs & imaging results that were available during my care of the patient were reviewed by me and considered in my medical decision making (see chart for details).     Patient has no signs of diabetic ketoacidosis.  Her blood sugar responded well to IV fluids as well as insulin.  Abdominal CT without acute findings.  Urinalysis with possible early infection will place on antibiotics and return precautions given.  She will follow-up with her physician to have her diabetic medications adjusted  Final Clinical Impressions(s) / ED Diagnoses   Final diagnoses:  None    ED Discharge Orders    None       Lacretia Leigh, MD 02/17/17 1515

## 2017-02-19 ENCOUNTER — Other Ambulatory Visit: Payer: Self-pay | Admitting: Pharmacist

## 2017-02-19 ENCOUNTER — Emergency Department (HOSPITAL_COMMUNITY)
Admission: EM | Admit: 2017-02-19 | Discharge: 2017-02-21 | Disposition: A | Discharge status: Home / Self Care | Payer: Medicare HMO | Attending: Emergency Medicine | Admitting: Emergency Medicine

## 2017-02-19 ENCOUNTER — Encounter (HOSPITAL_COMMUNITY): Payer: Self-pay | Admitting: *Deleted

## 2017-02-19 ENCOUNTER — Other Ambulatory Visit: Payer: Self-pay

## 2017-02-19 DIAGNOSIS — R300 Dysuria: Secondary | ICD-10-CM | POA: Diagnosis not present

## 2017-02-19 DIAGNOSIS — F609 Personality disorder, unspecified: Secondary | ICD-10-CM | POA: Diagnosis not present

## 2017-02-19 DIAGNOSIS — R739 Hyperglycemia, unspecified: Secondary | ICD-10-CM | POA: Diagnosis not present

## 2017-02-19 DIAGNOSIS — F329 Major depressive disorder, single episode, unspecified: Secondary | ICD-10-CM | POA: Diagnosis present

## 2017-02-19 DIAGNOSIS — F6089 Other specific personality disorders: Secondary | ICD-10-CM | POA: Diagnosis present

## 2017-02-19 DIAGNOSIS — Z79899 Other long term (current) drug therapy: Secondary | ICD-10-CM | POA: Diagnosis not present

## 2017-02-19 DIAGNOSIS — Z85028 Personal history of other malignant neoplasm of stomach: Secondary | ICD-10-CM | POA: Insufficient documentation

## 2017-02-19 DIAGNOSIS — F250 Schizoaffective disorder, bipolar type: Secondary | ICD-10-CM | POA: Diagnosis present

## 2017-02-19 DIAGNOSIS — E119 Type 2 diabetes mellitus without complications: Secondary | ICD-10-CM | POA: Insufficient documentation

## 2017-02-19 DIAGNOSIS — E108 Type 1 diabetes mellitus with unspecified complications: Secondary | ICD-10-CM | POA: Diagnosis not present

## 2017-02-19 DIAGNOSIS — R45851 Suicidal ideations: Secondary | ICD-10-CM | POA: Diagnosis not present

## 2017-02-19 DIAGNOSIS — I10 Essential (primary) hypertension: Secondary | ICD-10-CM | POA: Diagnosis not present

## 2017-02-19 DIAGNOSIS — R102 Pelvic and perineal pain: Secondary | ICD-10-CM | POA: Diagnosis not present

## 2017-02-19 LAB — I-STAT BETA HCG BLOOD, ED (MC, WL, AP ONLY)

## 2017-02-19 LAB — SALICYLATE LEVEL: Salicylate Lvl: <7 mg/dL (ref 2.8–30.0)

## 2017-02-19 LAB — CBG MONITORING, ED: Glucose-Capillary: 451 mg/dL — ABNORMAL HIGH (ref 65–99)

## 2017-02-19 LAB — COMPREHENSIVE METABOLIC PANEL
ALT: 20 U/L (ref 14–54)
AST: 34 U/L (ref 15–41)
Albumin: 3.7 g/dL (ref 3.5–5.0)
Alkaline Phosphatase: 110 U/L (ref 38–126)
Anion gap: 11 (ref 5–15)
BILIRUBIN TOTAL: 0.3 mg/dL (ref 0.3–1.2)
CALCIUM: 8.9 mg/dL (ref 8.9–10.3)
CO2: 22 mmol/L (ref 22–32)
CREATININE: 0.5 mg/dL (ref 0.44–1.00)
Chloride: 101 mmol/L (ref 101–111)
GFR calc Af Amer: >60 mL/min (ref >60)
Glucose, Bld: 448 mg/dL — ABNORMAL HIGH (ref 65–99)
Potassium: 3.6 mmol/L (ref 3.5–5.1)
Sodium: 134 mmol/L — ABNORMAL LOW (ref 135–145)
TOTAL PROTEIN: 7.3 g/dL (ref 6.5–8.1)

## 2017-02-19 LAB — CBC
HCT: 33 % — ABNORMAL LOW (ref 36.0–46.0)
Hemoglobin: 10.6 g/dL — ABNORMAL LOW (ref 12.0–15.0)
MCH: 25.4 pg — ABNORMAL LOW (ref 26.0–34.0)
MCHC: 32.1 g/dL (ref 30.0–36.0)
MCV: 79.1 fL (ref 78.0–100.0)
PLATELETS: 100 10*3/uL — AB (ref 150–400)
RBC: 4.17 MIL/uL (ref 3.87–5.11)
RDW: 15.8 % — AB (ref 11.5–15.5)
WBC: 3.4 10*3/uL — ABNORMAL LOW (ref 4.0–10.5)

## 2017-02-19 LAB — ACETAMINOPHEN LEVEL: Acetaminophen (Tylenol), Serum: <10 ug/mL — ABNORMAL LOW (ref 10–30)

## 2017-02-19 LAB — RAPID URINE DRUG SCREEN, HOSP PERFORMED
AMPHETAMINES: NOT DETECTED
Barbiturates: NOT DETECTED
Benzodiazepines: NOT DETECTED
Cocaine: NOT DETECTED
Opiates: NOT DETECTED
Tetrahydrocannabinol: NOT DETECTED

## 2017-02-19 LAB — ETHANOL

## 2017-02-19 MED ORDER — INSULIN ASPART 100 UNIT/ML ~~LOC~~ SOLN
5.0000 [IU] | Freq: Once | SUBCUTANEOUS | Status: AC
  Administered 2017-02-20: 5 [IU] via SUBCUTANEOUS
  Filled 2017-02-19: qty 1

## 2017-02-19 MED ORDER — SODIUM CHLORIDE 0.9 % IV BOLUS (SEPSIS)
1000.0000 mL | Freq: Once | INTRAVENOUS | Status: AC
  Administered 2017-02-20: 1000 mL via INTRAVENOUS

## 2017-02-19 NOTE — ED Notes (Signed)
Pt refusing to put on paper scrubs stating "they're too hot."  Pt consented to wear a hospital gown.

## 2017-02-19 NOTE — ED Notes (Signed)
Pt stated "I live by myself.  Me and my mom don't get along.  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  I went to [facility]."

## 2017-02-19 NOTE — ED Provider Notes (Signed)
[facility] DEPT Provider Note   CSN: [id number] Arrival date & time: 02/19/17  2134     History   Chief Complaint Chief Complaint  Patient presents with  . Suicidal    HPI Tina Saunders is a 32 y.o. female with history of bipolar disorder, depression, diabetes on insulin, hypertension, obesity, schizophrenia here for evaluation of suicidal ideations since this morning. States she has drank four 2 L Dr. Samson Frederic today and a lot of mac & cheese so she can go into diabetic come and die. She is also hearing voices telling her to kill herself and seeing faces. Reports history of previous suicidal attempt. Also states she's had right lower quadrant abdominal pain for one week, constant, crampy in nature worse with palpation, associated with increased vaginal itching and dryness.  Was seen in the emergency department recently for this and discharged with antibiotics for possible early UTI which she has not taken. No pelvic exam done. No previous sexual history, increased discharge or abnormal bleeding. LMP beginning of this month. She last administered herself insulin on Friday. States he usually injects insulin to the right lower quadrant of her abdomen. She denies fevers, chills, cough, chest pain, shortness of breath, dysuria, hematuria, abnormal vaginal discharge or bleeding, diarrhea, constipation, melena or hematochezia.  HPI  Past Medical History:  Diagnosis Date  . Anxiety   . Bipolar 1 disorder ([place])   . Cancer of abdominal wall   . Depression   . Diabetes mellitus without complication ([place])   . Hypertension   . Obesity   . Obesity   . Polycystic ovarian syndrome 07/01/2011   Patient report  . 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 Schizophrenia [facility])     Patient Active Problem List   Diagnosis Date Noted  . Seizures ([place]) 12/11/2016  . Acute lower UTI 12/11/2016  . DM2 (diabetes mellitus, type 2) ([place]) 12/11/2016  . Uncontrolled diabetes mellitus ([place])  11/03/2016  . Schizoaffective disorder, bipolar type ([place]) 11/02/2016  . 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 Schizoaffective disorder, mixed type ([place]) 05/30/2014  . Suicidal ideation   . 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 Headache 04/15/2013  . HTN (hypertension) 04/15/2013  . Post traumatic stress disorder 12/07/2011  . CAP (community acquired pneumonia) 08/27/2011  . Chest pain 08/26/2011  . SOB (shortness of breath) 08/26/2011  . Fever 08/26/2011  . Hypokalemia 08/26/2011  . PSVT (paroxysmal supraventricular tachycardia) ([place]) 08/26/2011  . ADHD 09/23/2007  . EPIGASTRIC PAIN 09/23/2007  . Obesity, unspecified 07/30/2007  . DEPRESSION 07/30/2007  . SLEEP DISORDER 07/30/2007  . IMPAIRED FASTING GLUCOSE 07/30/2007  . FATIGUE 11/21/2006  . ABNORMAL FINDINGS, ELEVATED BP W/O HTN 11/21/2006  . METRORRHAGIA 06/13/2006  . DISORDER, MENSTRUAL NEC 06/13/2006  . DIZZINESS 06/13/2006  . POLYCYSTIC OVARIAN DISEASE 04/25/2006  . AMENORRHEA, SECONDARY 04/20/2006  . ACNE, MILD 04/20/2006  . ABDOMINAL PAIN 04/20/2006    Past Surgical History:  Procedure Laterality Date  . CHOLECYSTECTOMY    . HERNIA REPAIR    . Ovarian Cyst Excision    . VARICOSE VEIN SURGERY      OB History    Gravida Para Term Preterm AB Living   0             SAB TAB Ectopic Multiple Live Births                   Home Medications    Prior to Admission medications   Medication Sig  Start Date End Date Taking? Authorizing Provider  busPIRone (BUSPAR) 15 MG tablet Take 15 mg by mouth 3 (three) times daily.   Yes [provider]  chlorproMAZINE (THORAZINE) 50 MG tablet Take 1 tablet (50 mg total) by mouth 3 (three) times daily. For agitation/mood control 02/09/17  Yes Lindell Spar I, NP  cloNIDine (CATAPRES) 0.1 MG tablet Take 1 tablet (0.1 mg total) by mouth 2 (two) times daily. For high blood pressure 02/09/17  Yes Nwoko, Agnes I, NP  cyclobenzaprine (FLEXERIL) 5 MG tablet Take 5  mg by mouth 2 (two) times daily.   Yes [provider]  dicyclomine (BENTYL) 20 MG tablet Take 1 tablet (20 mg total) by mouth every 8 (eight) hours as needed for spasms. 02/09/17  Yes Lindell Spar I, NP  Dulaglutide (TRULICITY) 1.5 OV/5.6EP SOPN Inject 1.5 mg into the skin every Friday. For diabetes management 02/09/17  Yes Lindell Spar I, NP  fenofibrate 160 MG tablet Take 1 tablet (160 mg total) by mouth daily. For high Cholesterol 02/10/17  Yes Lindell Spar I, NP  gabapentin (NEURONTIN) 400 MG capsule Take 1 capsule (400 mg total) by mouth 3 (three) times daily. For agitation/diabetic neuropathy 02/09/17  Yes Lindell Spar I, NP  hydrOXYzine (ATARAX/VISTARIL) 50 MG tablet Take 1 tablet (50 mg total) by mouth 3 (three) times daily as needed for anxiety. 02/09/17  Yes Lindell Spar I, NP  insulin aspart (NOVOLOG) 100 UNIT/ML injection Inject 6 Units into the skin 3 (three) times daily with meals. For diabetes management Patient taking differently: Inject 22 Units into the skin 3 (three) times daily with meals. For diabetes management 02/09/17  Yes Lindell Spar I, NP  insulin detemir (LEVEMIR) 100 UNIT/ML injection Inject 0.5 mLs (50 Units total) into the skin 2 (two) times daily. For diabetes management Patient taking differently: Inject 68 Units into the skin 2 (two) times daily. For diabetes management 02/09/17  Yes Lindell Spar I, NP  lactulose (CHRONULAC) 10 GM/15ML solution Take 20 g by mouth 2 (two) times daily.    Yes [provider]  levETIRAcetam (KEPPRA) 500 MG tablet Take 1 tablet (500 mg total) by mouth 2 (two) times daily. For mood stabilization 02/09/17  Yes Nwoko, Herbert Pun I, NP  lisinopril (PRINIVIL,ZESTRIL) 10 MG tablet Take 1 tablet (10 mg total) by mouth daily. For high blood pressure 02/09/17  Yes Nwoko, Agnes I, NP  pantoprazole (PROTONIX) 40 MG tablet Take 1 tablet (40 mg total) by mouth daily. For acid reflux 02/09/17  Yes Lindell Spar I, NP  QUEtiapine (SEROQUEL) 100 MG  tablet Take 5 tablets (500 mg total) by mouth at bedtime. For mood control 02/09/17  Yes Nwoko, Herbert Pun I, NP  QUEtiapine (SEROQUEL) 50 MG tablet Take 1 tablet (50 mg total) by mouth 2 (two) times daily. For agitation/mood control 02/09/17  Yes Lindell Spar I, NP  sertraline (ZOLOFT) 100 MG tablet Take 200 mg by mouth daily.   Yes [provider]  topiramate (TOPAMAX) 100 MG tablet Take 100 mg by mouth 2 (two) times daily.   Yes [provider]  traZODone (DESYREL) 50 MG tablet Take 1 tablet (50 mg total) by mouth at bedtime as needed for sleep. 02/09/17  Yes Lindell Spar I, NP  cephALEXin (KEFLEX) 500 MG capsule Take 1 capsule (500 mg total) by mouth 4 (four) times daily. 02/17/17   Lacretia Leigh, MD    Family History Family History  Problem Relation Age of Onset  . Coronary artery disease Maternal Grandmother   .  Diabetes type II Maternal Grandmother   . Cancer Maternal Grandmother   . Hypertension Mother   . Hypertension Father     Social History Social History   Tobacco Use  . Smoking status: Never Smoker  . Smokeless tobacco: Never Used  Substance Use Topics  . Alcohol use: No  . Drug use: No     Allergies   Fish-derived products; Geodon [ziprasidone hcl]; Haldol [haloperidol lactate]; Buprenorphine hcl; Compazine [prochlorperazine]; Morphine and related; and Toradol [ketorolac tromethamine]   Review of Systems Review of Systems  Gastrointestinal: Positive for abdominal pain.  Genitourinary: Positive for dysuria and pelvic pain.       +vaginal itching  Allergic/Immunologic: Positive for immunocompromised state (DM on insulin).  Psychiatric/Behavioral: Positive for dysphoric mood, hallucinations, self-injury and suicidal ideas. The patient is nervous/anxious.      Physical Exam Updated Vital Signs BP (!) 153/96 (BP Location: Right Arm)   Pulse (!) 112   Temp 98.7 F (37.1 C) (Oral)   Resp 20   Ht _0  (1.626 m)   Wt 104.3 kg (230 lb)   LMP  02/01/2017 (Approximate) Comment: HCG < 5 02-17-17  SpO2 97%   BMI 39.48 kg/m   Physical Exam  Constitutional: She is oriented to person, place, and time. She appears well-developed and well-nourished. No distress.  Non toxic  HENT:  Head: Normocephalic and atraumatic.  Nose: Nose normal.  Mouth/Throat: No oropharyngeal exudate.  Moist mucous membranes   Eyes: Conjunctivae and EOM are normal. Pupils are equal, round, and reactive to light.  Neck: Normal range of motion.  Cardiovascular: Normal rate, regular rhythm and intact distal pulses.  No murmur heard. 2+ DP and radial pulses bilaterally. No LE edema.   Pulmonary/Chest: Effort normal and breath sounds normal. No respiratory distress. She has no wheezes. She has no rales.  Abdominal: Soft. Bowel sounds are normal. There is tenderness.  Mild suprapubic and RLQ tenderness. No G/R/R. No CVA tenderness.   Genitourinary: Pelvic exam was performed with patient prone. Right adnexum displays tenderness. Vaginal discharge found.  Genitourinary Comments:  Dry, erythematous external genitalia with excoriations and white adherent discharge.   No groin lymphadenopathy.  Mild white, thin discharge in vaginal vault and on cervix.  +R adnexal tenderness w/o fullness. No CMT.   Musculoskeletal: Normal range of motion. She exhibits no deformity.  Neurological: She is alert and oriented to person, place, and time.  Skin: Skin is warm and dry. Capillary refill takes less than 2 seconds.  Psychiatric: She has a normal mood and affect. Her behavior is normal. Judgment and thought content normal.  Nursing note and vitals reviewed.    ED Treatments / Results  Labs (all labs ordered are listed, but only abnormal results are displayed) Labs Reviewed  WET PREP, GENITAL - Abnormal; Notable for the following components:      Result Value   Clue Cells Wet Prep HPF POC PRESENT (*)    WBC, Wet Prep HPF POC MANY (*)    All other components within normal  limits  COMPREHENSIVE METABOLIC PANEL - Abnormal; Notable for the following components:   Sodium 134 (*)    Glucose, Bld 448 (*)    BUN <5 (*)    All other components within normal limits  ACETAMINOPHEN LEVEL - Abnormal; Notable for the following components:   Acetaminophen (Tylenol), Serum <10 (*)    All other components within normal limits  CBC - Abnormal; Notable for the following components:   WBC 3.4 (*)  Hemoglobin 10.6 (*)    HCT 33.0 (*)    MCH 25.4 (*)    RDW 15.8 (*)    Platelets 100 (*)    All other components within normal limits  CBG MONITORING, ED - Abnormal; Notable for the following components:   Glucose-Capillary 451 (*)    All other components within normal limits  CBG MONITORING, ED - Abnormal; Notable for the following components:   Glucose-Capillary 303 (*)    All other components within normal limits  ETHANOL  SALICYLATE LEVEL  RAPID URINE DRUG SCREEN, HOSP PERFORMED  URINALYSIS, ROUTINE W REFLEX MICROSCOPIC  I-STAT BETA HCG BLOOD, ED ([facility], [facility], [facility] ONLY)  GC/CHLAMYDIA PROBE AMP () NOT AT [facility]    EKG  EKG Interpretation None       Radiology No results found.  Procedures Procedures (including critical care time)  Medications Ordered in ED Medications  sodium chloride 0.9 % bolus 1,000 mL (0 mLs Intravenous Stopped 02/20/17 0139)  insulin aspart (novoLOG) injection 5 Units (5 Units Subcutaneous Given 02/20/17 0031)  acetaminophen (TYLENOL) tablet 1,000 mg (1,000 mg Oral Given 02/20/17 0137)     Initial Impression / Assessment and Plan / ED Course  I have reviewed the triage vital signs and the nursing notes.  Pertinent labs & imaging results that were available during my care of the patient were reviewed by me and considered in my medical decision making (see chart for details).  Clinical Course as of Feb 21 140  Mon Feb 19, 2017  2301 Hemoglobin: (!) 10.6 [CG]  2301 Glucose: (!) 448 [CG]    Clinical Course User Index [CG]  Kinnie Feil, PA-C   32 yo here for suicidal ideation and attempt. Also, RLQ/pelvic discomfort, dysuria, vaginal irritation and itching x 4 days. Seen in ED for abdominal pain 3 days ago, had normal CTAP but no pelvic exam was done. She was discharged for antibiotics for possible early urinary tract infection however she has not taken it.  Suspect yeast vaginitis vs dermatitis, possible ovarian cyst causing right adnexal discomfort.  She has h/o PCOS. She denies previous sexual activity, PID lower on differential. Symptomatology not consistent with ovarian torsion. She had CT AP three days ago in ED with normal appearing appendix GC/C  And wet prep pending. Pt will be handed off to oncoming EDPA [facility] who will f/u on pelvic ultrasound, repeat CBG, wet prep.  Pt has agreed to receive IVF and insulin for hyperglycemia tx in ED. No evidence of DKA.  If work up benign, pt to be medically cleared for TTS consult.   Final Clinical Impressions(s) / ED Diagnoses   Final diagnoses:  None    ED Discharge Orders    None       Arlean Hopping 02/20/17 0142    Davonna Belling, MD 02/20/17 [phone]

## 2017-02-19 NOTE — Telephone Encounter (Signed)
Returning call.

## 2017-02-19 NOTE — ED Triage Notes (Signed)
Pt brought in by [facility] with [employer].  Pt stated "The voices are telling me to kill myself."  Per EMS, pt has drank 4 2 liters of Dr. Malachi Bonds & eaten several helpings of mac n' cheese, refuses to take her insulin hoping to go into a diabetic coma.

## 2017-02-19 NOTE — Telephone Encounter (Signed)
Patient calling back regarding this states she needs to reschedule colonoscopy [facility].

## 2017-02-19 NOTE — Patient Outreach (Signed)
[place] [facility]) Care Management  [place]   02/19/2017  Tina Saunders Apr 17, 1985 [id number]  Subjective: Patient was called regarding post discharge medication review.  HIPAA identifiers were obtained.  Patient is a 32 year old female with multiple medical conditions including but not limited to:  seizure disorder, type 2 diabetes, hypertension bipolar disorder, anxiety, obesity, polycystic ovarian syndrome, and schizophrenia.  Patient was hospitalized 1/15. She visited the ED 02/18/16 for hyperglycemia.   Objective:   Encounter Medications: Outpatient Encounter Medications as of 02/19/2017  Medication Sig  . busPIRone (BUSPAR) 15 MG tablet Take 15 mg by mouth 3 (three) times daily.  . chlorproMAZINE (THORAZINE) 50 MG tablet Take 1 tablet (50 mg total) by mouth 3 (three) times daily. For agitation/mood control  . cloNIDine (CATAPRES) 0.1 MG tablet Take 1 tablet (0.1 mg total) by mouth 2 (two) times daily. For high blood pressure  . cyclobenzaprine (FLEXERIL) 5 MG tablet Take 5 mg by mouth 2 (two) times daily.  Marland Kitchen dicyclomine (BENTYL) 20 MG tablet Take 1 tablet (20 mg total) by mouth every 8 (eight) hours as needed for spasms.  . Dulaglutide (TRULICITY) 1.5 SW/5.4OE SOPN Inject 1.5 mg into the skin every Friday. For diabetes management  . fenofibrate 160 MG tablet Take 1 tablet (160 mg total) by mouth daily. For high Cholesterol  . gabapentin (NEURONTIN) 400 MG capsule Take 1 capsule (400 mg total) by mouth 3 (three) times daily. For agitation/diabetic neuropathy  . hydrOXYzine (ATARAX/VISTARIL) 50 MG tablet Take 1 tablet (50 mg total) by mouth 3 (three) times daily as needed for anxiety.  . insulin aspart (NOVOLOG) 100 UNIT/ML injection Inject 6 Units into the skin 3 (three) times daily with meals. For diabetes management (Patient taking differently: Inject 22 Units into the skin 3 (three) times daily with meals. For diabetes management)  . insulin detemir (LEVEMIR) 100  UNIT/ML injection Inject 0.5 mLs (50 Units total) into the skin 2 (two) times daily. For diabetes management (Patient taking differently: Inject 68 Units into the skin 2 (two) times daily. For diabetes management)  . lactulose (CHRONULAC) 10 GM/15ML solution Take 20 g by mouth 2 (two) times daily.   Marland Kitchen levETIRAcetam (KEPPRA) 500 MG tablet Take 1 tablet (500 mg total) by mouth 2 (two) times daily. For mood stabilization  . lisinopril (PRINIVIL,ZESTRIL) 10 MG tablet Take 1 tablet (10 mg total) by mouth daily. For high blood pressure  . pantoprazole (PROTONIX) 40 MG tablet Take 1 tablet (40 mg total) by mouth daily. For acid reflux  . QUEtiapine (SEROQUEL) 100 MG tablet Take 5 tablets (500 mg total) by mouth at bedtime. For mood control  . QUEtiapine (SEROQUEL) 50 MG tablet Take 1 tablet (50 mg total) by mouth 2 (two) times daily. For agitation/mood control  . traZODone (DESYREL) 50 MG tablet Take 1 tablet (50 mg total) by mouth at bedtime as needed for sleep.  . cephALEXin (KEFLEX) 500 MG capsule Take 1 capsule (500 mg total) by mouth 4 (four) times daily.  . sertraline (ZOLOFT) 100 MG tablet Take 200 mg by mouth daily.  Marland Kitchen topiramate (TOPAMAX) 100 MG tablet Take 100 mg by mouth 2 (two) times daily.  . [DISCONTINUED] ibuprofen (ADVIL,MOTRIN) 800 MG tablet Take 800 mg by mouth every 8 (eight) hours as needed for mild pain.  . [DISCONTINUED] QUEtiapine (SEROQUEL) 200 MG tablet Take 450 mg by mouth at bedtime.   No facility-administered encounter medications on file as of 02/19/2017.     Functional  Status: In your present state of health, do you have any difficulty performing the following activities: 02/06/2017 02/06/2017  Hearing? - N  Vision? - N  Difficulty concentrating or making decisions? - N  Walking or climbing stairs? - N  Comment - -  Dressing or bathing? - N  Doing errands, shopping? Y -  Some recent data might be hidden    Fall/Depression Screening: No flowsheet data found. No  flowsheet data found.    Assessment: ASSESSMENT: Date Discharged from Hospital: 02/10/2017 Date Medication Reconciliation Performed: 02/19/2017  Medications Discontinued at Discharge:  Commonly known as:  BUSPAR   cyclobenzaprine 5 MG tablet Commonly known as:  FLEXERIL   ibuprofen 800 MG tablet Commonly known as:  ADVIL,MOTRIN   metronidazole 0.75 % vaginal gel Commonly known as:  METROGEL VAGINAL   ondansetron 8 MG disintegrating tablet Commonly known as:  ZOFRAN ODT   Quetiapine 200 MG 24 hr tablet Commonly known as:  SEROQUEL XR Replaced by:  Quetiapine 100 MG tablet You also have another medication with the same name that you need to continue taking as instructed.   sertraline 100 MG tablet Commonly known as:  ZOLOFT   Topiramate 100 MG tablet Commonly known as:  TOPAMAX   tramadol 50 MG tablet Commonly known as:       New Medications at Discharge: (delete if applicable) Cephalexin 500mg  1 capsule three times daily  Patient was recently discharged from hospital and all medications have been reviewed   Drugs sorted by system:  Neurologic/Psychologic: Buspirone Chlorpromazine Hydroxyzine Levetiracetam Quetiapine Sertraline Topiramate Trazodone  Cardiovascular: Clonidine Fenofibrate Lisinopril  Gastrointestinal: Lactulose Dicyclomine Pantoprazole  Endocrine: Novolog Levemir Trulicity  Infectious Disease: Cephalexin  Medication Review Findings:  Patient reported that she does not really know her medications individually because she receives her medications in Bubble packs from [employer].     Patient reported she uses 68 units of Levemir twice daily and 22 units of Novolog three times daily  Cyclobenzaprine, sertraline and topiramate were discontinued at discharge but patient was unsure if she is still taking them since her medications are in pill packs.  PLAN: Call [facility] to complete a medication review with  them Route note to patient's PCP after calling [facility]. Follow back up with the patient after calling [place]

## 2017-02-20 ENCOUNTER — Other Ambulatory Visit: Payer: Self-pay | Admitting: Pharmacist

## 2017-02-20 ENCOUNTER — Emergency Department (HOSPITAL_COMMUNITY): Payer: Medicare HMO

## 2017-02-20 DIAGNOSIS — R102 Pelvic and perineal pain: Secondary | ICD-10-CM | POA: Diagnosis not present

## 2017-02-20 LAB — WET PREP, GENITAL
Sperm: NONE SEEN
TRICH WET PREP: NONE SEEN
Yeast Wet Prep HPF POC: NONE SEEN

## 2017-02-20 LAB — URINALYSIS, ROUTINE W REFLEX MICROSCOPIC
BILIRUBIN URINE: NEGATIVE
Glucose, UA: >=500 mg/dL — AB
HGB URINE DIPSTICK: NEGATIVE
Ketones, ur: NEGATIVE mg/dL
Leukocytes, UA: NEGATIVE
Nitrite: NEGATIVE
PROTEIN: NEGATIVE mg/dL
Specific Gravity, Urine: 1.031 — ABNORMAL HIGH (ref 1.005–1.030)
pH: 6 (ref 5.0–8.0)

## 2017-02-20 LAB — CBG MONITORING, ED
GLUCOSE-CAPILLARY: 327 mg/dL — AB (ref 65–99)
GLUCOSE-CAPILLARY: 289 mg/dL — AB (ref 65–99)
GLUCOSE-CAPILLARY: 290 mg/dL — AB (ref 65–99)
Glucose-Capillary: 303 mg/dL — ABNORMAL HIGH (ref 65–99)
Glucose-Capillary: 345 mg/dL — ABNORMAL HIGH (ref 65–99)

## 2017-02-20 LAB — GC/CHLAMYDIA PROBE AMP (~~LOC~~) NOT AT ARMC
CHLAMYDIA, DNA PROBE: NEGATIVE
Neisseria Gonorrhea: NEGATIVE

## 2017-02-20 MED ORDER — SERTRALINE HCL 50 MG PO TABS
200.0000 mg | ORAL_TABLET | Freq: Every day | ORAL | Status: DC
  Administered 2017-02-20 – 2017-02-21 (×2): 200 mg via ORAL
  Filled 2017-02-20 (×4): qty 4

## 2017-02-20 MED ORDER — BUSPIRONE HCL 10 MG PO TABS
15.0000 mg | ORAL_TABLET | Freq: Three times a day (TID) | ORAL | Status: DC
  Administered 2017-02-20 – 2017-02-21 (×4): 15 mg via ORAL
  Filled 2017-02-20 (×4): qty 2

## 2017-02-20 MED ORDER — INSULIN ASPART 100 UNIT/ML ~~LOC~~ SOLN
22.0000 [IU] | Freq: Three times a day (TID) | SUBCUTANEOUS | Status: DC
  Administered 2017-02-20 – 2017-02-21 (×5): 22 [IU] via SUBCUTANEOUS
  Filled 2017-02-20 (×5): qty 1

## 2017-02-20 MED ORDER — CHLORPROMAZINE HCL 25 MG PO TABS
50.0000 mg | ORAL_TABLET | Freq: Three times a day (TID) | ORAL | Status: DC
  Administered 2017-02-20 – 2017-02-21 (×4): 50 mg via ORAL
  Filled 2017-02-20 (×4): qty 2

## 2017-02-20 MED ORDER — CYCLOBENZAPRINE HCL 10 MG PO TABS
5.0000 mg | ORAL_TABLET | Freq: Two times a day (BID) | ORAL | Status: DC
  Administered 2017-02-20 – 2017-02-21 (×3): 5 mg via ORAL
  Filled 2017-02-20 (×4): qty 1

## 2017-02-20 MED ORDER — LEVETIRACETAM 500 MG PO TABS
500.0000 mg | ORAL_TABLET | Freq: Two times a day (BID) | ORAL | Status: DC
  Administered 2017-02-20 – 2017-02-21 (×3): 500 mg via ORAL
  Filled 2017-02-20 (×3): qty 1

## 2017-02-20 MED ORDER — DULAGLUTIDE 1.5 MG/0.5ML ~~LOC~~ SOAJ
1.5000 mg | SUBCUTANEOUS | Status: DC
Start: 2017-02-23 — End: 2017-02-21

## 2017-02-20 MED ORDER — CLONIDINE HCL 0.1 MG PO TABS
0.1000 mg | ORAL_TABLET | Freq: Two times a day (BID) | ORAL | Status: DC
  Administered 2017-02-20 – 2017-02-21 (×3): 0.1 mg via ORAL
  Filled 2017-02-20 (×3): qty 1

## 2017-02-20 MED ORDER — TRAZODONE HCL 50 MG PO TABS: 50.0000 mg | ORAL_TABLET | Freq: Every evening | ORAL | Status: DC | PRN

## 2017-02-20 MED ORDER — QUETIAPINE FUMARATE 50 MG PO TABS
50.0000 mg | ORAL_TABLET | Freq: Two times a day (BID) | ORAL | Status: DC
  Administered 2017-02-20 – 2017-02-21 (×3): 50 mg via ORAL
  Filled 2017-02-20 (×3): qty 1

## 2017-02-20 MED ORDER — LACTULOSE 10 GM/15ML PO SOLN
20.0000 g | Freq: Two times a day (BID) | ORAL | Status: DC
  Administered 2017-02-20 – 2017-02-21 (×3): 20 g via ORAL
  Filled 2017-02-20 (×4): qty 30

## 2017-02-20 MED ORDER — HYDROXYZINE HCL 25 MG PO TABS: 50.0000 mg | ORAL_TABLET | Freq: Three times a day (TID) | ORAL | Status: DC | PRN

## 2017-02-20 MED ORDER — PANTOPRAZOLE SODIUM 40 MG PO TBEC
40.0000 mg | DELAYED_RELEASE_TABLET | Freq: Every day | ORAL | Status: DC
Start: 2017-02-20 — End: 2017-02-21
  Administered 2017-02-20 – 2017-02-21 (×2): 40 mg via ORAL
  Filled 2017-02-20 (×2): qty 1

## 2017-02-20 MED ORDER — ACETAMINOPHEN 500 MG PO TABS
1000.0000 mg | ORAL_TABLET | Freq: Once | ORAL | Status: AC
  Administered 2017-02-20: 1000 mg via ORAL
  Filled 2017-02-20: qty 2

## 2017-02-20 MED ORDER — GABAPENTIN 400 MG PO CAPS
400.0000 mg | ORAL_CAPSULE | Freq: Three times a day (TID) | ORAL | Status: DC
  Administered 2017-02-20 – 2017-02-21 (×4): 400 mg via ORAL
  Filled 2017-02-20 (×4): qty 1

## 2017-02-20 MED ORDER — FENOFIBRATE 160 MG PO TABS
160.0000 mg | ORAL_TABLET | Freq: Every day | ORAL | Status: DC
  Administered 2017-02-20 – 2017-02-21 (×2): 160 mg via ORAL
  Filled 2017-02-20 (×3): qty 1

## 2017-02-20 MED ORDER — INSULIN DETEMIR 100 UNIT/ML ~~LOC~~ SOLN
68.0000 [IU] | Freq: Two times a day (BID) | SUBCUTANEOUS | Status: DC
  Administered 2017-02-20 – 2017-02-21 (×3): 68 [IU] via SUBCUTANEOUS
  Filled 2017-02-20 (×5): qty 0.68

## 2017-02-20 MED ORDER — DICYCLOMINE HCL 20 MG PO TABS: 20.0000 mg | ORAL_TABLET | Freq: Three times a day (TID) | ORAL | Status: DC | PRN

## 2017-02-20 MED ORDER — QUETIAPINE FUMARATE 100 MG PO TABS
500.0000 mg | ORAL_TABLET | Freq: Every day | ORAL | Status: DC
  Administered 2017-02-20: 500 mg via ORAL
  Filled 2017-02-20: qty 2

## 2017-02-20 MED ORDER — TOPIRAMATE 100 MG PO TABS
100.0000 mg | ORAL_TABLET | Freq: Two times a day (BID) | ORAL | Status: DC
  Administered 2017-02-20 – 2017-02-21 (×3): 100 mg via ORAL
  Filled 2017-02-20 (×3): qty 1

## 2017-02-20 MED ORDER — METRONIDAZOLE 500 MG PO TABS
500.0000 mg | ORAL_TABLET | Freq: Two times a day (BID) | ORAL | Status: DC
  Administered 2017-02-20 – 2017-02-21 (×3): 500 mg via ORAL
  Filled 2017-02-20 (×3): qty 1

## 2017-02-20 MED ORDER — LISINOPRIL 10 MG PO TABS
10.0000 mg | ORAL_TABLET | Freq: Every day | ORAL | Status: DC
  Administered 2017-02-20 – 2017-02-21 (×2): 10 mg via ORAL
  Filled 2017-02-20 (×2): qty 1

## 2017-02-20 NOTE — ED Notes (Signed)
Psych Provider at bedside. 

## 2017-02-20 NOTE — Patient Outreach (Signed)
[place] [facility]) Care Management  [place]   02/20/2017  BRYANNE RIQUELME [birth year] [id number]  Subjective: Patient is currently hospitalized and could not be called. However, [facility] was called to review the medications in the patient's pill pack since the patient said she did not really know her medications as they are dispensed in a pill pack.  Patient is a 32 year old female with multiple medical conditions including but not limited to:  seizure disorder, type 2 diabetes, hypertension bipolar disorder, anxiety, obesity, polycystic ovarian syndrome, and schizophrenia.  Patient was hospitalized 1/15. She visited the ED 02/18/16 for hyperglycemia. She is also currently hospitalized.  Objective:   Encounter Medications: Facility-Administered Encounter Medications as of 02/20/2017  Medication  . busPIRone (BUSPAR) tablet 15 mg  . chlorproMAZINE (THORAZINE) tablet 50 mg  . cloNIDine (CATAPRES) tablet 0.1 mg  . cyclobenzaprine (FLEXERIL) tablet 5 mg  . dicyclomine (BENTYL) tablet 20 mg  . [START ON 02/23/2017] Dulaglutide SOPN 1.5 mg  . fenofibrate tablet 160 mg  . gabapentin (NEURONTIN) capsule 400 mg  . hydrOXYzine (ATARAX/VISTARIL) tablet 50 mg  . insulin aspart (novoLOG) injection 22 Units  . insulin detemir (LEVEMIR) injection 68 Units  . lactulose (CHRONULAC) 10 GM/15ML solution 20 g  . levETIRAcetam (KEPPRA) tablet 500 mg  . lisinopril (PRINIVIL,ZESTRIL) tablet 10 mg  . metroNIDAZOLE (FLAGYL) tablet 500 mg  . pantoprazole (PROTONIX) EC tablet 40 mg  . QUEtiapine (SEROQUEL) tablet 50 mg  . QUEtiapine (SEROQUEL) tablet 500 mg  . sertraline (ZOLOFT) tablet 200 mg  . topiramate (TOPAMAX) tablet 100 mg  . traZODone (DESYREL) tablet 50 mg   Outpatient Encounter Medications as of 02/20/2017  Medication Sig Note  . busPIRone (BUSPAR) 15 MG tablet Take 15 mg by mouth 3 (three) times daily.   . cephALEXin (KEFLEX) 500 MG capsule Take 1 capsule (500 mg  total) by mouth 4 (four) times daily. 02/19/2017: Not started   . chlorproMAZINE (THORAZINE) 50 MG tablet Take 1 tablet (50 mg total) by mouth 3 (three) times daily. For agitation/mood control   . cloNIDine (CATAPRES) 0.1 MG tablet Take 1 tablet (0.1 mg total) by mouth 2 (two) times daily. For high blood pressure   . cyclobenzaprine (FLEXERIL) 5 MG tablet Take 5 mg by mouth 2 (two) times daily.   Marland Kitchen dicyclomine (BENTYL) 20 MG tablet Take 1 tablet (20 mg total) by mouth every 8 (eight) hours as needed for spasms.   . Dulaglutide (TRULICITY) 1.5 JS/9.7WY SOPN Inject 1.5 mg into the skin every Friday. For diabetes management   . fenofibrate 160 MG tablet Take 1 tablet (160 mg total) by mouth daily. For high Cholesterol   . gabapentin (NEURONTIN) 400 MG capsule Take 1 capsule (400 mg total) by mouth 3 (three) times daily. For agitation/diabetic neuropathy   . hydrOXYzine (ATARAX/VISTARIL) 50 MG tablet Take 1 tablet (50 mg total) by mouth 3 (three) times daily as needed for anxiety.   . insulin aspart (NOVOLOG) 100 UNIT/ML injection Inject 6 Units into the skin 3 (three) times daily with meals. For diabetes management (Patient taking differently: Inject 22 Units into the skin 3 (three) times daily with meals. For diabetes management)   . insulin detemir (LEVEMIR) 100 UNIT/ML injection Inject 0.5 mLs (50 Units total) into the skin 2 (two) times daily. For diabetes management (Patient taking differently: Inject 68 Units into the skin 2 (two) times daily. For diabetes management)   . lactulose (CHRONULAC) 10 GM/15ML solution Take 20 g  by mouth 2 (two) times daily.    Marland Kitchen levETIRAcetam (KEPPRA) 500 MG tablet Take 1 tablet (500 mg total) by mouth 2 (two) times daily. For mood stabilization   . lisinopril (PRINIVIL,ZESTRIL) 10 MG tablet Take 1 tablet (10 mg total) by mouth daily. For high blood pressure   . pantoprazole (PROTONIX) 40 MG tablet Take 1 tablet (40 mg total) by mouth daily. For acid reflux   .  QUEtiapine (SEROQUEL) 100 MG tablet Take 5 tablets (500 mg total) by mouth at bedtime. For mood control   . QUEtiapine (SEROQUEL) 50 MG tablet Take 1 tablet (50 mg total) by mouth 2 (two) times daily. For agitation/mood control   . sertraline (ZOLOFT) 100 MG tablet Take 200 mg by mouth daily.   Marland Kitchen topiramate (TOPAMAX) 100 MG tablet Take 100 mg by mouth 2 (two) times daily.   . traZODone (DESYREL) 50 MG tablet Take 1 tablet (50 mg total) by mouth at bedtime as needed for sleep.     Functional Status: In your present state of health, do you have any difficulty performing the following activities: 02/20/2017 02/06/2017  Hearing? N -  Vision? N -  Difficulty concentrating or making decisions? N -  Walking or climbing stairs? N -  Comment - -  Dressing or bathing? N -  Doing errands, shopping? - Y  Some recent data might be hidden    Assessment: Medication review findings: Buspirone was not dispensed in the patient's pill pack and it is unclear if she is taking it.   Plan: Since patient is currently hospitalized, pharmacist will follow up with the patient when she gets discharged as her medications may change.   Elayne Guerin, PharmD, [place] Clinical Pharmacist [phone]

## 2017-02-20 NOTE — ED Notes (Signed)
Patient awaiting placement, poss [facility]

## 2017-02-20 NOTE — ED Notes (Signed)
Pt awaiting psych bed placement

## 2017-02-20 NOTE — ED Provider Notes (Signed)
Assumed care from PA Gibbons at shift change.  See prior notes for full H&P.  Briefly, 32 y.o. 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  Patient found to be hyperglycemic here without evidence of DKA.  She did report some right lower abdominal pain, similar complaint when seen here a few days ago and had CT scan that was negative.  She did have pelvic exam performed due to vaginal itching which does reveal some clue cells.  She did have some mild adnexal tenderness on exam.  Plan:  Wet prep and [place] pending.  Results for orders placed or performed during the hospital encounter of 02/19/17  Wet prep, genital  Result Value Ref Range   Yeast Wet Prep HPF POC NONE SEEN NONE SEEN   Trich, Wet Prep NONE SEEN NONE SEEN   Clue Cells Wet Prep HPF POC PRESENT (A) NONE SEEN   WBC, Wet Prep HPF POC MANY (A) NONE SEEN   Sperm NONE SEEN   Comprehensive metabolic panel  Result Value Ref Range   Sodium 134 (L) 135 - 145 mmol/L   Potassium 3.6 3.5 - 5.1 mmol/L   Chloride 101 101 - 111 mmol/L   CO2 22 22 - 32 mmol/L   Glucose, Bld 448 (H) 65 - 99 mg/dL   BUN <5 (L) 6 - 20 mg/dL   Creatinine, Ser 0.50 0.44 - 1.00 mg/dL   Calcium 8.9 8.9 - 10.3 mg/dL   Total Protein 7.3 6.5 - 8.1 g/dL   Albumin 3.7 3.5 - 5.0 g/dL   AST 34 15 - 41 U/L   ALT 20 14 - 54 U/L   Alkaline Phosphatase 110 38 - 126 U/L   Total Bilirubin 0.3 0.3 - 1.2 mg/dL   GFR calc non Af Amer >60 >60 mL/min   GFR calc Af Amer >60 >60 mL/min   Anion gap 11 5 - 15  Ethanol  Result Value Ref Range   Alcohol, Ethyl (B) <57 <84 mg/dL  Salicylate level  Result Value Ref Range   Salicylate Lvl <6.9 2.8 - 30.0 mg/dL  Acetaminophen level  Result Value Ref Range   Acetaminophen (Tylenol), Serum <10 (L) 10 - 30 ug/mL  cbc  Result Value Ref Range   WBC 3.4 (L) 4.0 - 10.5 K/uL   RBC 4.17 3.87 - 5.11 MIL/uL   Hemoglobin 10.6 (L) 12.0 - 15.0 g/dL   HCT 33.0 (L) 36.0 - 46.0 %   MCV 79.1 78.0 - 100.0 fL   MCH 25.4 (L) 26.0  - 34.0 pg   MCHC 32.1 30.0 - 36.0 g/dL   RDW 15.8 (H) 11.5 - 15.5 %   Platelets 100 (L) 150 - 400 K/uL  Rapid urine drug screen (hospital performed)  Result Value Ref Range   Opiates NONE DETECTED NONE DETECTED   Cocaine NONE DETECTED NONE DETECTED   Benzodiazepines NONE DETECTED NONE DETECTED   Amphetamines NONE DETECTED NONE DETECTED   Tetrahydrocannabinol NONE DETECTED NONE DETECTED   Barbiturates NONE DETECTED NONE DETECTED  Urinalysis, Routine w reflex microscopic  Result Value Ref Range   Color, Urine STRAW (A) YELLOW   APPearance CLEAR CLEAR   Specific Gravity, Urine 1.031 (H) 1.005 - 1.030   pH 6.0 5.0 - 8.0   Glucose, UA >=500 (A) NEGATIVE mg/dL   Hgb urine dipstick NEGATIVE NEGATIVE   Bilirubin Urine NEGATIVE NEGATIVE   Ketones, ur NEGATIVE NEGATIVE mg/dL   Protein, ur NEGATIVE NEGATIVE mg/dL   Nitrite  NEGATIVE NEGATIVE   Leukocytes, UA NEGATIVE NEGATIVE   RBC / HPF 0-5 0 - 5 RBC/hpf   WBC, UA 0-5 0 - 5 WBC/hpf   Bacteria, UA RARE (A) NONE SEEN   Squamous Epithelial / LPF 0-5 (A) NONE SEEN  I-Stat beta hCG blood, ED  Result Value Ref Range   I-stat hCG, quantitative <5.0 <5 mIU/mL   Comment 3          POC CBG, ED  Result Value Ref Range   Glucose-Capillary 451 (H) 65 - 99 mg/dL  POC CBG, ED  Result Value Ref Range   Glucose-Capillary 303 (H) 65 - 99 mg/dL   US Pelvis Complete  Result Date: 02/20/2017 CLINICAL DATA:  Right pelvic tenderness for 4 days. EXAM: TRANSABDOMINAL ULTRASOUND OF PELVIS DOPPLER ULTRASOUND OF OVARIES TECHNIQUE: Transabdominal ultrasound examination of the pelvis was performed including evaluation of the uterus, ovaries, adnexal regions, and pelvic cul-de-sac. Color and duplex Doppler ultrasound was utilized to evaluate blood flow to the ovaries. COMPARISON:  10/26/2016 FINDINGS: Uterus Measurements: 6.2 x 2.8 x 4.5 cm. No fibroids or other mass visualized. Endometrium Not well visualized Right ovary Not visualized Left ovary Not visualized No  Doppler images were obtained as ovaries were not visualized. Examination is technically limited due to patient's body habitus and under distended bladder. Transvaginal imaging could not be performed as the patient is not sexually active. IMPRESSION: Limited examination. Uterus is visualized and is normal in size. Endometrium and ovaries were not seen. Electronically Signed   By: Lucienne Capers M.D.   On: 02/20/2017 02:52   Ct Abdomen Pelvis W Contrast  Result Date: 02/17/2017 CLINICAL DATA:  Nausea.  Hyperglycemia. EXAM: CT ABDOMEN AND PELVIS WITH CONTRAST TECHNIQUE: Multidetector CT imaging of the abdomen and pelvis was performed using the standard protocol following bolus administration of intravenous contrast. CONTRAST:  145mL ISOVUE-300 IOPAMIDOL (ISOVUE-300) INJECTION 61% COMPARISON:  02/03/2017 FINDINGS: Lower chest: No acute abnormality. Hepatobiliary: Stable hepatomegaly with striking geographic fat deposition in both right and left lobes as well as diffuse pattern of steatosis. No overt cirrhosis or hepatic masses identified. The gallbladder has been removed. No biliary dilatation. Pancreas: Unremarkable. No pancreatic ductal dilatation or surrounding inflammatory changes. Spleen: Stable moderate splenomegaly. Adrenals/Urinary Tract: Adrenal glands are unremarkable. Kidneys are normal, without renal calculi, focal lesion, or hydronephrosis. Bladder is unremarkable. Stomach/Bowel: Bowel shows no evidence of obstruction, inflammation or ileus. No free air. Vascular/Lymphatic: No significant vascular findings are present. No enlarged abdominal or pelvic lymph nodes. Reproductive: Uterus and bilateral adnexa are unremarkable. Other: No abdominal wall hernia or abnormality. No abdominopelvic ascites. Musculoskeletal: No acute or significant osseous findings. IMPRESSION: No acute findings. Stable hepatosplenomegaly and striking steatosis of the liver. Electronically Signed   By: Aletta Edouard M.D.   On:  02/17/2017 14:02   Ct Abdomen Pelvis W Contrast  Result Date: 02/03/2017 CLINICAL DATA:  Abdominal pain EXAM: CT ABDOMEN AND PELVIS WITH CONTRAST TECHNIQUE: Multidetector CT imaging of the abdomen and pelvis was performed using the standard protocol following bolus administration of intravenous contrast. CONTRAST:  138mL ISOVUE-300 IOPAMIDOL (ISOVUE-300) INJECTION 61% COMPARISON:  10/26/2016 FINDINGS: Lower chest: Heart is borderline in size.  Lung bases clear. Hepatobiliary: Diffuse low-density throughout the liver compatible with fatty infiltration. More severe low-density noted anteriorly in both the right and left hepatic lobes suggesting more severe focal fatty infiltration. No focal abnormality. Prior cholecystectomy Pancreas: No focal abnormality or ductal dilatation. Spleen: Splenomegaly. Craniocaudal length of the spleen measures 21 cm. Adrenals/Urinary Tract:  No adrenal abnormality. No focal renal abnormality. No stones or hydronephrosis. Urinary bladder is unremarkable. Stomach/Bowel: Stomach, large and small bowel grossly unremarkable. Vascular/Lymphatic: No evidence of aneurysm or adenopathy. Reproductive: Uterus and adnexa unremarkable.  No mass. Other: No free fluid or free air. Musculoskeletal: No acute findings. IMPRESSION: Severe diffuse fatty infiltration of the liver with more severe focal fatty infiltration anteriorly in both the right and left hepatic lobes. Associated marked splenomegaly. Electronically Signed   By: Rolm Baptise M.D.   On: 02/03/2017 09:02   [place] Art/ven Flow Abd Pelv Doppler  Result Date: 02/20/2017 CLINICAL DATA:  Right pelvic tenderness for 4 days. EXAM: TRANSABDOMINAL ULTRASOUND OF PELVIS DOPPLER ULTRASOUND OF OVARIES TECHNIQUE: Transabdominal ultrasound examination of the pelvis was performed including evaluation of the uterus, ovaries, adnexal regions, and pelvic cul-de-sac. Color and duplex Doppler ultrasound was utilized to evaluate blood flow to the ovaries.  COMPARISON:  10/26/2016 FINDINGS: Uterus Measurements: 6.2 x 2.8 x 4.5 cm. No fibroids or other mass visualized. Endometrium Not well visualized Right ovary Not visualized Left ovary Not visualized No Doppler images were obtained as ovaries were not visualized. Examination is technically limited due to patient's body habitus and under distended bladder. Transvaginal imaging could not be performed as the patient is not sexually active. IMPRESSION: Limited examination. Uterus is visualized and is normal in size. Endometrium and ovaries were not seen. Electronically Signed   By: Lucienne Capers M.D.   On: 02/20/2017 02:52   Wet prep with clue wells noted, will treat for BV.  US obtained, however transvaginal portion study as [place] tech reported patient was not sexually active and did not want this done, therefore study very limited due to patient's body habitus.  CT from a few days ago also normal.  Do not feel she needs repeat CT at this time.  Can medically clear.  Home meds ordered.  TTS consult pending.   Larene Pickett, PA-C 02/20/17 0518    Merryl Hacker, MD 02/20/17 [id number]

## 2017-02-20 NOTE — ED Notes (Signed)
Patient is resting comfortably. 

## 2017-02-20 NOTE — ED Notes (Addendum)
Pt admitted to room #40.  Pt presents with flat affect, reports increase in depression. Pt endorsing SI without plan, reports this is chronic in nature.  Pt denies HI. Denies AVH. Encouragement and support provided. Special checks q 15 mins in place for safety, Video monitoring in place. Will continue to monitor.

## 2017-02-20 NOTE — ED Notes (Signed)
Pelvic cart placed @ BS.

## 2017-02-20 NOTE — ED Notes (Signed)
US in room with pt 

## 2017-02-20 NOTE — ED Notes (Signed)
TTS at bedside. 

## 2017-02-20 NOTE — ED Notes (Signed)
Pt sts she is having thoughts of hurting herself.  Pt sts shes hearing voices that are telling her to hurt herself.  Pt has a plan to overdose on her medications

## 2017-02-20 NOTE — ED Notes (Signed)
Bed: [facility] Expected date:  Expected time:  Means of arrival:  Comments: 87
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
## 2017-02-20 NOTE — ED Notes (Signed)
CALL [facility]/ACT TEAM when pt is d/ced from hospital

## 2017-02-20 NOTE — Telephone Encounter (Signed)
Line busy

## 2017-02-21 DIAGNOSIS — F609 Personality disorder, unspecified: Secondary | ICD-10-CM

## 2017-02-21 DIAGNOSIS — E119 Type 2 diabetes mellitus without complications: Secondary | ICD-10-CM | POA: Diagnosis not present

## 2017-02-21 LAB — CBG MONITORING, ED
Glucose-Capillary: 191 mg/dL — ABNORMAL HIGH (ref 65–99)
Glucose-Capillary: 354 mg/dL — ABNORMAL HIGH (ref 65–99)
Glucose-Capillary: 365 mg/dL — ABNORMAL HIGH (ref 65–99)

## 2017-02-21 MED ORDER — METRONIDAZOLE 500 MG PO TABS: 500.0000 mg | ORAL_TABLET | Freq: Two times a day (BID) | ORAL | 0 refills | Status: DC

## 2017-02-21 NOTE — ED Notes (Signed)
Pt was arrived directly to TCU per charge nurse, unable to locate belongings, pt is aware.

## 2017-02-21 NOTE — ED Notes (Signed)
On the phone 

## 2017-02-21 NOTE — ED Notes (Signed)
ACT (ameen) is here to pick her up.  Pt has been able to contact other person who has a extra key and they will meet them at her house.

## 2017-02-21 NOTE — BH Assessment (Addendum)
[facility] Assessment Progress Note  Per Buford Dresser, DO, this pt does not require psychiatric hospitalization at this time.  Pt is to be discharged from [facility] with recommendation to continue treatment with the [facility] Team.  This has been included in pt's discharge instructions.  Pt's nurse, Narda Rutherford, has been notified.  Jalene Mullet, MA Triage Specialist [phone]   Addendum:  At pt's request, this Probation officer called the [facility] ACT Team and spoke to Fredna Dow to arrange for pt to be picked up.  Call was placed at 14:37.  Lauren agrees to call me back with approximate pick up time.  Return call is pending as of this writing.  Pt's nurse, Narda Rutherford, has been notified.  Jalene Mullet, [place] [facility] Coordinator [phone]   Addendum:  Bryn Gulling calls back from [facility] at 14:47.  He will present at [facility] for this pt in about 30 minutes, and would prefer for pt to be waiting for him in the lobby.  Narda Rutherford has been notified.  Jalene Mullet, [place] Coordinator [phone]

## 2017-02-21 NOTE — BHH Suicide Risk Assessment (Signed)
Suicide Risk Assessment  Discharge Assessment   [facility] Discharge Suicide Risk Assessment   Principal Problem: Cluster B personality disorder [facility]) Discharge Diagnoses:  Patient Active Problem List   Diagnosis Date Noted  . Seizures ([place]) [R56.9] 12/11/2016  . Acute lower UTI [N39.0] 12/11/2016  . DM2 (diabetes mellitus, type 2) ([place]) [E11.9] 12/11/2016  . Uncontrolled diabetes mellitus ([place]) [E11.65] 11/03/2016  . Schizoaffective disorder, bipolar type ([place]) [F25.0] 11/02/2016  . Cluster B personality disorder ([place]) [F60.9] 11/02/2016  . Schizoaffective disorder, mixed type ([place]) [F25.0] 05/30/2014  . Suicidal ideation [R45.851]   . 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 Headache [R51] 04/15/2013  . HTN (hypertension) [I10] 04/15/2013  . Post traumatic stress disorder [F43.10] 12/07/2011  . CAP (community acquired pneumonia) [J18.9] 08/27/2011  . Chest pain [R07.9] 08/26/2011  . SOB (shortness of breath) [R06.02] 08/26/2011  . Fever [R50.9] 08/26/2011  . Hypokalemia [E87.6] 08/26/2011  . PSVT (paroxysmal supraventricular tachycardia) ([place]) [I47.1] 08/26/2011  . ADHD [F90.9] 09/23/2007  . EPIGASTRIC PAIN [R10.13] 09/23/2007  . Obesity, unspecified [E66.9] 07/30/2007  . DEPRESSION [F32.9] 07/30/2007  . SLEEP DISORDER [G47.9] 07/30/2007  . IMPAIRED FASTING GLUCOSE [R73.01] 07/30/2007  . FATIGUE [R53.81, R53.83] 11/21/2006  . ABNORMAL FINDINGS, ELEVATED BP W/O HTN [R03.0] 11/21/2006  . METRORRHAGIA [N92.1] 06/13/2006  . DISORDER, MENSTRUAL NEC [N94.9] 06/13/2006  . DIZZINESS [R42] 06/13/2006  . POLYCYSTIC OVARIAN DISEASE [E28.2] 04/25/2006  . AMENORRHEA, SECONDARY [N91.2] 04/20/2006  . ACNE, MILD [L70.8] 04/20/2006  . ABDOMINAL PAIN [R10.9] 04/20/2006    Total Time spent with patient: 30 minutes  Musculoskeletal: Strength & Muscle Tone: within normal limits Gait & Station: normal Patient leans: N/A  Psychiatric Specialty Exam: Physical Exam  Constitutional: She  is oriented to person, place, and time. She appears well-developed and well-nourished.  HENT:  Head: Normocephalic.  Respiratory: Effort normal.  Musculoskeletal: Normal range of motion.  Neurological: She is alert and oriented to person, place, and time.  Psychiatric: Thought content normal. Her speech is delayed. She is slowed. Cognition and memory are impaired. She expresses impulsivity. She exhibits a depressed mood.   Review of Systems  Psychiatric/Behavioral: Positive for depression. Negative for hallucinations, memory loss, substance abuse and suicidal ideas. The patient is not nervous/anxious and does not have insomnia.   All other systems reviewed and are negative.  Blood pressure 119/65, pulse 85, temperature 97.9 F (36.6 C), temperature source Oral, resp. rate 20, height 5\' 4"  (1.626 m), weight 104.3 kg (230 lb), last menstrual period 02/01/2017, SpO2 99 %.Body mass index is 39.48 kg/m. General Appearance: Disheveled Eye Contact:  Fair Speech:  Clear and Coherent and Slow Volume:  Decreased Mood:  Depressed and Dysphoric Affect:  Congruent, Depressed and Flat Thought Process:  Coherent and Linear Orientation:  Full (Time, Place, and Person) Thought Content:  Logical Suicidal Thoughts:  No Homicidal Thoughts:  No Memory:  Immediate;   Good Recent;   Good Remote;   Fair Judgement:  Fair Insight:  Fair Psychomotor Activity:  Decreased Concentration:  Concentration: Good and Attention Span: Good Recall:  Good Fund of Knowledge:  Good Language:  Good Akathisia:  No Handed:  Right AIMS (if indicated):    Assets:  Agricultural consultant Housing Social Support ADL's:  Intact Cognition:  WNL   Mental Status Per Nursing Assessment::   On Admission:   Suicidal ideation  Demographic Factors:  Caucasian, Low socioeconomic status, Living alone and Unemployed  Loss Factors: Financial problems/change in socioeconomic status  Historical  Factors:  Impulsivity  Risk Reduction Factors:   Sense of responsibility to family  Continued Clinical Symptoms:  Depression:   Impulsivity Personality Disorders:   Cluster B  Cognitive Features That Contribute To Risk:  Closed-mindedness    Suicide Risk:  Minimal: No identifiable suicidal ideation.  Patients presenting with no risk factors but with morbid ruminations; may be classified as minimal risk based on the severity of the depressive symptoms    Plan Of Care/Follow-up recommendations:  Activity:  as tolerated Diet:  Heart Healthy  Ethelene Hal, NP 02/21/2017, 12:23 PM

## 2017-02-21 NOTE — ED Notes (Signed)
Dr Vanita Panda aware that pt is being dc from [facility] no additional orders at this time concerning blood sugars.

## 2017-02-21 NOTE — H&P (View-Only) (Signed)
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 Seizures ([place]) [R56.9] 12/11/2016  . Acute lower UTI [N39.0] 12/11/2016  . DM2 (diabetes mellitus, type 2) ([place]) [E11.9] 12/11/2016  . Uncontrolled diabetes mellitus ([place]) [E11.65] 11/03/2016  . Schizoaffective disorder, bipolar type ([place]) [F25.0] 11/02/2016  . 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 Schizoaffective disorder, mixed type ([place]) [F25.0] 05/30/2014  . Suicidal ideation [R45.851]   . 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 Headache [R51] 04/15/2013  . HTN (hypertension) [I10] 04/15/2013  . Post traumatic stress disorder [F43.10] 12/07/2011  . CAP (community acquired pneumonia) [J18.9] 08/27/2011  . Chest pain [R07.9] 08/26/2011  . SOB (shortness of breath) [R06.02] 08/26/2011  . Fever [R50.9] 08/26/2011  . Hypokalemia [E87.6] 08/26/2011  . PSVT (paroxysmal supraventricular tachycardia) ([place]) [I47.1] 08/26/2011  . ADHD [F90.9] 09/23/2007  . EPIGASTRIC PAIN [R10.13] 09/23/2007  . Obesity, unspecified [E66.9] 07/30/2007  . DEPRESSION [F32.9] 07/30/2007  . SLEEP DISORDER [G47.9] 07/30/2007  . IMPAIRED FASTING GLUCOSE [R73.01] 07/30/2007  . FATIGUE [R53.81, R53.83] 11/21/2006  . ABNORMAL FINDINGS, ELEVATED BP W/O HTN [R03.0] 11/21/2006  . METRORRHAGIA [N92.1] 06/13/2006  . DISORDER, MENSTRUAL NEC [N94.9] 06/13/2006  . DIZZINESS [R42] 06/13/2006  . POLYCYSTIC OVARIAN DISEASE [E28.2] 04/25/2006  . AMENORRHEA, SECONDARY [N91.2] 04/20/2006  . ACNE, MILD [L70.8] 04/20/2006  . ABDOMINAL PAIN [R10.9] 04/20/2006    Total Time spent with patient: 30 minutes  Subjective:   Tina Saunders is a 32 y.o. female patient admitted with suicidal ideation with a plan to induce  a diabetic coma.  HPI:  Pt was seen and chart reviewed with treatment team and Dr Mariea Clonts. Pt stated she drank 4-2 liter bottles of soda in an attempt to induce a diabetic coma. Pt is well known to the hospital system and frequents the emergency rooms seeking admission. Pt has secondary gain due to living alone and sees the hospital as a place to have companionship. Pt has had 14 ED visits and 3 inpatient admissions in the past 6 months. Pt's UDS and BAL negative. Pt has an ACT team with [facility] [facility]. Pt is chronically suicidal and appears to be at her baseline. Today,  Pt denies suicidal/homicidal ideation, denies auditory/visual hallucinations and does not appear to be responding to internal stimuli. Pt is psychiatrically clear for discharge.   Past Psychiatric History: As above  Risk to Self: None Risk to Others: None Prior Inpatient Therapy: Prior Inpatient Therapy: Yes Prior Therapy Dates: 2018 and mult other admissions Prior Therapy Facilty/Provider(s): [facility], [facility]  Reason for Treatment: BORDERLINE PERSONALITY D/O, MDD, SI, PSYCHOSIS  Prior Outpatient Therapy: Prior Outpatient Therapy: Yes Prior Therapy Dates: current Prior Therapy Facilty/Provider(s): [facility] Reason for Treatment: med management, counseling  Does patient have an ACCT team?: Yes Does patient have Intensive In-House Services?  : No Does patient have [facility] services? : No Does patient have P4[facility] services?: No  Past Medical History:  Past Medical History:  Diagnosis Date  . Anxiety   . Bipolar 1 disorder ([place])   . Cancer of abdominal wall   . Depression   . Diabetes mellitus without complication ([place])   . Hypertension   . Obesity   . Obesity   . Polycystic ovarian syndrome 07/01/2011   Patient report  . 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  Schizophrenia [facility])     Past Surgical History:  Procedure Laterality Date  . CHOLECYSTECTOMY    . HERNIA REPAIR    . Ovarian Cyst Excision    . VARICOSE VEIN SURGERY      Family History:  Family History  Problem Relation Age of Onset  . Coronary artery disease Maternal Grandmother   . Diabetes type II Maternal Grandmother   . Cancer Maternal Grandmother   . Hypertension Mother   . Hypertension Father    Family Psychiatric  History: Unknown Social History:  Social History   Substance and Sexual Activity  Alcohol Use No     Social History   Substance and Sexual Activity  Drug Use No    Social History   Socioeconomic History  . Marital status: Single    Spouse name: None  . Number of children: 0  . Years of education: None  . Highest education level: None  Social Needs  . Financial resource strain: None  . Food insecurity - worry: None  . Food insecurity - inability: None  . Transportation needs - medical: None  . Transportation needs - non-medical: None  Occupational History  . None  Tobacco Use  . Smoking status: Never Smoker  . Smokeless tobacco: Never Used  Substance and Sexual Activity  . Alcohol use: No  . Drug use: No  . Sexual activity: No    Birth control/protection: None  Other Topics Concern  . None  Social History Narrative  . None   Additional Social History: N/A    Allergies:   Allergies  Allergen Reactions  . Fish-Derived Products Anaphylaxis    Can only eat [place]ounder  . Geodon [Ziprasidone Hcl] Other (See Comments)    Face pulls, cant swallow - Locked Jaw  . Haldol [Haloperidol Lactate] Other (See Comments)    Face pulls, can't swallow - Locked Jaw  . Buprenorphine Hcl Hives, Itching, Rash and Other (See Comments)    GI upset  . Compazine [Prochlorperazine] Other (See Comments)    anxiety and hyperactivity  . Morphine And Related Hives, Itching, Rash and Other (See Comments)    GI upset  . Toradol [Ketorolac Tromethamine] Other (See Comments)    Anxiety and hyperactivity    Labs:  Results for orders placed or performed during the hospital encounter of 02/19/17 (from the past 48 hour(s))   Comprehensive metabolic panel     Status: Abnormal   Collection Time: 02/19/17 10:11 PM  Result Value Ref Range   Sodium 134 (L) 135 - 145 mmol/L   Potassium 3.6 3.5 - 5.1 mmol/L   Chloride 101 101 - 111 mmol/L   CO2 22 22 - 32 mmol/L   Glucose, Bld 448 (H) 65 - 99 mg/dL   BUN <5 (L) 6 - 20 mg/dL   Creatinine, Ser 0.50 0.44 - 1.00 mg/dL   Calcium 8.9 8.9 - 10.3 mg/dL   Total Protein 7.3 6.5 - 8.1 g/dL   Albumin 3.7 3.5 - 5.0 g/dL   AST 34 15 - 41 U/L   ALT 20 14 - 54 U/L   Alkaline Phosphatase 110 38 - 126 U/L   Total Bilirubin 0.3 0.3 - 1.2 mg/dL   GFR calc non Af Amer >60 >60 mL/min   GFR calc Af Amer >60 >60 mL/min    Comment: (NOTE) The eGFR has been calculated using the CKD EPI equation. This calculation has not been validated in all clinical situations. eGFR's persistently <60 mL/min signify possible Chronic Kidney Disease.  Anion gap 11 5 - 15  Ethanol     Status: None   Collection Time: 02/19/17 10:11 PM  Result Value Ref Range   Alcohol, Ethyl (B) <10 <10 mg/dL    Comment:        LOWEST DETECTABLE LIMIT FOR SERUM ALCOHOL IS 10 mg/dL FOR MEDICAL PURPOSES ONLY   Salicylate level     Status: None   Collection Time: 02/19/17 10:11 PM  Result Value Ref Range   Salicylate Lvl <1.7 2.8 - 30.0 mg/dL  Acetaminophen level     Status: Abnormal   Collection Time: 02/19/17 10:11 PM  Result Value Ref Range   Acetaminophen (Tylenol), Serum <10 (L) 10 - 30 ug/mL    Comment:        THERAPEUTIC CONCENTRATIONS VARY SIGNIFICANTLY. A RANGE OF 10-30 ug/mL MAY BE AN EFFECTIVE CONCENTRATION FOR MANY PATIENTS. HOWEVER, SOME ARE BEST TREATED AT CONCENTRATIONS OUTSIDE THIS RANGE. ACETAMINOPHEN CONCENTRATIONS >150 ug/mL AT 4 HOURS AFTER INGESTION AND >50 ug/mL AT 12 HOURS AFTER INGESTION ARE OFTEN ASSOCIATED WITH TOXIC REACTIONS.   cbc     Status: Abnormal   Collection Time: 02/19/17 10:11 PM  Result Value Ref Range   WBC 3.4 (L) 4.0 - 10.5 K/uL   RBC 4.17 3.87 - 5.11  MIL/uL   Hemoglobin 10.6 (L) 12.0 - 15.0 g/dL   HCT 33.0 (L) 36.0 - 46.0 %   MCV 79.1 78.0 - 100.0 fL   MCH 25.4 (L) 26.0 - 34.0 pg   MCHC 32.1 30.0 - 36.0 g/dL   RDW 15.8 (H) 11.5 - 15.5 %   Platelets 100 (L) 150 - 400 K/uL    Comment: REPEATED TO VERIFY SPECIMEN CHECKED FOR CLOTS PLATELET COUNT CONFIRMED BY SMEAR   Rapid urine drug screen (hospital performed)     Status: None   Collection Time: 02/19/17 10:11 PM  Result Value Ref Range   Opiates NONE DETECTED NONE DETECTED   Cocaine NONE DETECTED NONE DETECTED   Benzodiazepines NONE DETECTED NONE DETECTED   Amphetamines NONE DETECTED NONE DETECTED   Tetrahydrocannabinol NONE DETECTED NONE DETECTED   Barbiturates NONE DETECTED NONE DETECTED    Comment: (NOTE) DRUG SCREEN FOR MEDICAL PURPOSES ONLY.  IF CONFIRMATION IS NEEDED FOR ANY PURPOSE, NOTIFY LAB WITHIN 5 DAYS. LOWEST DETECTABLE LIMITS FOR URINE DRUG SCREEN Drug Class                     Cutoff (ng/mL) Amphetamine and metabolites    1000 Barbiturate and metabolites    200 Benzodiazepine                 793 Tricyclics and metabolites     300 Opiates and metabolites        300 Cocaine and metabolites        300 THC                            50   I-Stat beta hCG blood, ED     Status: None   Collection Time: 02/19/17 10:40 PM  Result Value Ref Range   I-stat hCG, quantitative <5.0 <5 mIU/mL   Comment 3            Comment:   GEST. AGE      CONC.  (mIU/mL)   <=1 WEEK        5 - 50     2 WEEKS       50 -  500     3 WEEKS       100 - 10,000     4 WEEKS     1,000 - 30,000        FEMALE AND NON-PREGNANT FEMALE:     LESS THAN 5 mIU/mL   POC CBG, ED     Status: Abnormal   Collection Time: 02/19/17 10:44 PM  Result Value Ref Range   Glucose-Capillary 451 (H) 65 - 99 mg/dL  Urinalysis, Routine w reflex microscopic     Status: Abnormal   Collection Time: 02/20/17  1:09 AM  Result Value Ref Range   Color, Urine STRAW (A) YELLOW   APPearance CLEAR CLEAR   Specific  Gravity, Urine 1.031 (H) 1.005 - 1.030   pH 6.0 5.0 - 8.0   Glucose, UA >=500 (A) NEGATIVE mg/dL   Hgb urine dipstick NEGATIVE NEGATIVE   Bilirubin Urine NEGATIVE NEGATIVE   Ketones, ur NEGATIVE NEGATIVE mg/dL   Protein, ur NEGATIVE NEGATIVE mg/dL   Nitrite NEGATIVE NEGATIVE   Leukocytes, UA NEGATIVE NEGATIVE   RBC / HPF 0-5 0 - 5 RBC/hpf   WBC, UA 0-5 0 - 5 WBC/hpf   Bacteria, UA RARE (A) NONE SEEN   Squamous Epithelial / LPF 0-5 (A) NONE SEEN  Wet prep, genital     Status: Abnormal   Collection Time: 02/20/17  1:09 AM  Result Value Ref Range   Yeast Wet Prep HPF POC NONE SEEN NONE SEEN   Trich, Wet Prep NONE SEEN NONE SEEN   Clue Cells Wet Prep HPF POC PRESENT (A) NONE SEEN   WBC, Wet Prep HPF POC MANY (A) NONE SEEN   Sperm NONE SEEN   POC CBG, ED     Status: Abnormal   Collection Time: 02/20/17  1:37 AM  Result Value Ref Range   Glucose-Capillary 303 (H) 65 - 99 mg/dL  CBG monitoring, ED     Status: Abnormal   Collection Time: 02/20/17  7:22 AM  Result Value Ref Range   Glucose-Capillary 345 (H) 65 - 99 mg/dL   Comment 1 Notify RN    Comment 2 Document in Chart   POC CBG, ED     Status: Abnormal   Collection Time: 02/20/17 11:57 AM  Result Value Ref Range   Glucose-Capillary 327 (H) 65 - 99 mg/dL   Comment 1 Notify RN    Comment 2 Document in Chart   POC CBG, ED     Status: Abnormal   Collection Time: 02/20/17  5:19 PM  Result Value Ref Range   Glucose-Capillary 289 (H) 65 - 99 mg/dL   Comment 1 Notify RN    Comment 2 Document in Chart   POC CBG, ED     Status: Abnormal   Collection Time: 02/20/17 10:45 PM  Result Value Ref Range   Glucose-Capillary 290 (H) 65 - 99 mg/dL  POC CBG, ED     Status: Abnormal   Collection Time: 02/21/17  7:44 AM  Result Value Ref Range   Glucose-Capillary 191 (H) 65 - 99 mg/dL   Comment 1 Notify RN   POC CBG, ED     Status: Abnormal   Collection Time: 02/21/17 10:39 AM  Result Value Ref Range   Glucose-Capillary 354 (H) 65 - 99  mg/dL    Current Facility-Administered Medications  Medication Dose Route Frequency Provider Last Rate Last Dose  . busPIRone (BUSPAR) tablet 15 mg  15 mg Oral TID Larene Pickett, PA-C   15 mg  at 02/21/17 1044  . chlorproMAZINE (THORAZINE) tablet 50 mg  50 mg Oral TID Larene Pickett, PA-C   50 mg at 02/21/17 1045  . cloNIDine (CATAPRES) tablet 0.1 mg  0.1 mg Oral BID Larene Pickett, PA-C   0.1 mg at 02/21/17 1046  . cyclobenzaprine (FLEXERIL) tablet 5 mg  5 mg Oral BID Larene Pickett, PA-C   5 mg at 02/21/17 1043  . dicyclomine (BENTYL) tablet 20 mg  20 mg Oral Q8H PRN Larene Pickett, PA-C      . [START ON 02/23/2017] Dulaglutide SOPN 1.5 mg  1.5 mg Subcutaneous Q Fri Sanders, Lisa M, PA-C      . fenofibrate tablet 160 mg  160 mg Oral Daily Larene Pickett, PA-C   160 mg at 02/21/17 1045  . gabapentin (NEURONTIN) capsule 400 mg  400 mg Oral TID Larene Pickett, PA-C   400 mg at 02/21/17 1046  . hydrOXYzine (ATARAX/VISTARIL) tablet 50 mg  50 mg Oral TID PRN Larene Pickett, PA-C      . insulin aspart (novoLOG) injection 22 Units  22 Units Subcutaneous TID WC Larene Pickett, PA-C   22 Units at 02/21/17 0755  . insulin detemir (LEVEMIR) injection 68 Units  68 Units Subcutaneous BID Larene Pickett, PA-C   68 Units at 02/21/17 1050  . lactulose (CHRONULAC) 10 GM/15ML solution 20 g  20 g Oral BID Larene Pickett, PA-C   20 g at 02/21/17 1048  . levETIRAcetam (KEPPRA) tablet 500 mg  500 mg Oral BID Larene Pickett, PA-C   500 mg at 02/21/17 1047  . lisinopril (PRINIVIL,ZESTRIL) tablet 10 mg  10 mg Oral Daily Larene Pickett, PA-C   10 mg at 02/21/17 1047  . metroNIDAZOLE (FLAGYL) tablet 500 mg  500 mg Oral Q12H Larene Pickett, PA-C   500 mg at 02/21/17 1044  . pantoprazole (PROTONIX) EC tablet 40 mg  40 mg Oral Daily Larene Pickett, PA-C   40 mg at 02/21/17 1045  . QUEtiapine (SEROQUEL) tablet 50 mg  50 mg Oral BID Larene Pickett, PA-C   50 mg at 02/21/17 1047  . QUEtiapine (SEROQUEL) tablet  500 mg  500 mg Oral QHS Larene Pickett, PA-C   500 mg at 02/20/17 2248  . sertraline (ZOLOFT) tablet 200 mg  200 mg Oral Daily Larene Pickett, PA-C   200 mg at 02/21/17 1147  . topiramate (TOPAMAX) tablet 100 mg  100 mg Oral BID Larene Pickett, PA-C   100 mg at 02/21/17 1046  . traZODone (DESYREL) tablet 50 mg  50 mg Oral QHS PRN Larene Pickett, PA-C       Current Outpatient Medications  Medication Sig Dispense Refill  . busPIRone (BUSPAR) 15 MG tablet Take 15 mg by mouth 3 (three) times daily.    . chlorproMAZINE (THORAZINE) 50 MG tablet Take 1 tablet (50 mg total) by mouth 3 (three) times daily. For agitation/mood control 90 tablet 0  . cloNIDine (CATAPRES) 0.1 MG tablet Take 1 tablet (0.1 mg total) by mouth 2 (two) times daily. For high blood pressure 12 tablet 0  . cyclobenzaprine (FLEXERIL) 5 MG tablet Take 5 mg by mouth 2 (two) times daily.    Marland Kitchen dicyclomine (BENTYL) 20 MG tablet Take 1 tablet (20 mg total) by mouth every 8 (eight) hours as needed for spasms.    . Dulaglutide (TRULICITY) 1.5 TI/1.4ER SOPN Inject 1.5 mg into the skin every Friday.  For diabetes management    . fenofibrate 160 MG tablet Take 1 tablet (160 mg total) by mouth daily. For high Cholesterol 7 tablet 0  . gabapentin (NEURONTIN) 400 MG capsule Take 1 capsule (400 mg total) by mouth 3 (three) times daily. For agitation/diabetic neuropathy 90 capsule 0  . hydrOXYzine (ATARAX/VISTARIL) 50 MG tablet Take 1 tablet (50 mg total) by mouth 3 (three) times daily as needed for anxiety. 60 tablet 0  . insulin aspart (NOVOLOG) 100 UNIT/ML injection Inject 6 Units into the skin 3 (three) times daily with meals. For diabetes management 10 mL 0  . insulin detemir (LEVEMIR) 100 UNIT/ML injection Inject 0.5 mLs (50 Units total) into the skin 2 (two) times daily. For diabetes management 10 mL 0  . lactulose (CHRONULAC) 10 GM/15ML solution Take 20 g by mouth 2 (two) times daily.     Marland Kitchen levETIRAcetam (KEPPRA) 500 MG tablet Take 1 tablet  (500 mg total) by mouth 2 (two) times daily. For mood stabilization 60 tablet 0  . lisinopril (PRINIVIL,ZESTRIL) 10 MG tablet Take 1 tablet (10 mg total) by mouth daily. For high blood pressure 6 tablet 0  . pantoprazole (PROTONIX) 40 MG tablet Take 1 tablet (40 mg total) by mouth daily. For acid reflux 6 tablet 0  . QUEtiapine (SEROQUEL) 100 MG tablet Take 5 tablets (500 mg total) by mouth at bedtime. For mood control 150 tablet 0  . QUEtiapine (SEROQUEL) 50 MG tablet Take 1 tablet (50 mg total) by mouth 2 (two) times daily. For agitation/mood control 60 tablet 0  . sertraline (ZOLOFT) 100 MG tablet Take 200 mg by mouth daily.    Marland Kitchen topiramate (TOPAMAX) 100 MG tablet Take 100 mg by mouth 2 (two) times daily.    . traZODone (DESYREL) 50 MG tablet Take 1 tablet (50 mg total) by mouth at bedtime as needed for sleep. 30 tablet 0  . cephALEXin (KEFLEX) 500 MG capsule Take 1 capsule (500 mg total) by mouth 4 (four) times daily. 20 capsule 0    Musculoskeletal: Strength & Muscle Tone: within normal limits Gait & Station: normal Patient leans: N/A  Psychiatric Specialty Exam: Physical Exam  Nursing note and vitals reviewed. Constitutional: She is oriented to person, place, and time. She appears well-developed and well-nourished.  HENT:  Head: Normocephalic and atraumatic.  Respiratory: Effort normal.  Musculoskeletal: Normal range of motion.  Neurological: She is alert and oriented to person, place, and time.  Psychiatric: Thought content normal. Her speech is delayed. She is slowed. Cognition and memory are impaired. She expresses impulsivity. She exhibits a depressed mood.    Review of Systems  Psychiatric/Behavioral: Positive for depression. Negative for hallucinations, memory loss, substance abuse and suicidal ideas. The patient is not nervous/anxious and does not have insomnia.   All other systems reviewed and are negative.   Blood pressure 119/65, pulse 85, temperature 97.9 F (36.6 C),  temperature source Oral, resp. rate 20, height 5' 4" (1.626 m), weight 104.3 kg (230 lb), last menstrual period 02/01/2017, SpO2 99 %.Body mass index is 39.48 kg/m.  General Appearance: Disheveled  Eye Contact:  Fair  Speech:  Clear and Coherent and Slow  Volume:  Decreased  Mood:  Depressed and Dysphoric  Affect:  Congruent, Depressed and Flat  Thought Process:  Coherent and Linear  Orientation:  Full (Time, Place, and Person)  Thought Content:  Logical  Suicidal Thoughts:  No  Homicidal Thoughts:  No  Memory:  Immediate;   Good Recent;  Good Remote;   Fair  Judgement:  Fair  Insight:  Fair  Psychomotor Activity:  Decreased  Concentration:  Concentration: Good and Attention Span: Good  Recall:  Good  Fund of Knowledge:  Good  Language:  Good  Akathisia:  No  Handed:  Right  AIMS (if indicated):    N/A  Assets:  Agricultural consultant Housing Social Support  ADL's:  Intact  Cognition:  WNL  Sleep:    N/A     Treatment Plan Summary: Plan Borderline Personality Disorder  Discharge Home Follow up with [facility] ACT team Take all medications as prescribed   Disposition: No evidence of imminent risk to self or others at present.   Patient does not meet criteria for psychiatric inpatient admission. Supportive therapy provided about ongoing stressors. Discussed crisis plan, support from social network, calling 911, coming to the Emergency Department, and calling Suicide Hotline.  Ethelene Hal, NP 02/21/2017 12:12 PM   Patient seen face-to-face for psychiatric evaluation, chart reviewed and case discussed with the physician extender and developed treatment plan. Reviewed the information documented and agree with the treatment plan.  Buford Dresser, DO 02/21/17 8:14 PM

## 2017-02-21 NOTE — ED Notes (Addendum)
Pt reports that she arrived with clothing, flip flops, and pocket book(no money, but had her SS card/keys in it).  Unable to locate belongings in [facility] unit or TCU, ED charge nurse aware and will look for them.

## 2017-02-21 NOTE — ED Notes (Signed)
Up to the bathroom 

## 2017-02-21 NOTE — Discharge Instructions (Signed)
For your mental health needs, you are advised to continue treatment with the [facility] ACT Team:       [facility]      [address]      [place], [place] [address]      [phone]      24 hour crisis number: [phone]

## 2017-02-21 NOTE — Telephone Encounter (Signed)
The pts phone number listed in Epic has been disconnected.  I will mail a letter to have the pt return call.

## 2017-02-21 NOTE — Consult Note (Signed)
[facility] Face-to-Face Psychiatry Consult   Reason for Consult:  Suicidal Ideation Referring Physician:  EDP Patient Identification: Tina Saunders MRN:  [id number] Principal Diagnosis: Cluster B personality disorder (HCC) Diagnosis:   Patient Active Problem List   Diagnosis Date Noted  . Seizures (HCC) [R56.9] 12/11/2016  . Acute lower UTI [N39.0] 12/11/2016  . DM2 (diabetes mellitus, type 2) (HCC) [E11.9] 12/11/2016  . Uncontrolled diabetes mellitus (HCC) [E11.65] 11/03/2016  . Schizoaffective disorder, bipolar type (HCC) [F25.0] 11/02/2016  . Cluster B personality disorder (HCC) [F60.9] 11/02/2016  . Schizoaffective disorder, mixed type (HCC) [F25.0] 05/30/2014  . Suicidal ideation [R45.851]   . 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 Headache [R51] 04/15/2013  . HTN (hypertension) [I10] 04/15/2013  . Post traumatic stress disorder [F43.10] 12/07/2011  . CAP (community acquired pneumonia) [J18.9] 08/27/2011  . Chest pain [R07.9] 08/26/2011  . SOB (shortness of breath) [R06.02] 08/26/2011  . Fever [R50.9] 08/26/2011  . Hypokalemia [E87.6] 08/26/2011  . PSVT (paroxysmal supraventricular tachycardia) (HCC) [I47.1] 08/26/2011  . ADHD [F90.9] 09/23/2007  . EPIGASTRIC PAIN [R10.13] 09/23/2007  . Obesity, unspecified [E66.9] 07/30/2007  . DEPRESSION [F32.9] 07/30/2007  . SLEEP DISORDER [G47.9] 07/30/2007  . IMPAIRED FASTING GLUCOSE [R73.01] 07/30/2007  . FATIGUE [R53.81, R53.83] 11/21/2006  . ABNORMAL FINDINGS, ELEVATED BP W/O HTN [R03.0] 11/21/2006  . METRORRHAGIA [N92.1] 06/13/2006  . DISORDER, MENSTRUAL NEC [N94.9] 06/13/2006  . DIZZINESS [R42] 06/13/2006  . POLYCYSTIC OVARIAN DISEASE [E28.2] 04/25/2006  . AMENORRHEA, SECONDARY [N91.2] 04/20/2006  . ACNE, MILD [L70.8] 04/20/2006  . ABDOMINAL PAIN [R10.9] 04/20/2006    Total Time spent with patient: 30 minutes  Subjective:   Dashonda N Siebers is a 32 y.o. female patient admitted with suicidal ideation with a plan to induce  a diabetic coma.  HPI:  Pt was seen and chart reviewed with treatment team and Dr Kendrick Haapala. Pt stated she drank 4-2 liter bottles of soda in an attempt to induce a diabetic coma. Pt is well known to the hospital system and frequents the emergency rooms seeking admission. Pt has secondary gain due to living alone and sees the hospital as a place to have companionship. Pt has had 14 ED visits and 3 inpatient admissions in the past 6 months. Pt's UDS and BAL negative. Pt has an ACT team with [facility]. Pt is chronically suicidal and appears to be at her baseline. Today,  Pt denies suicidal/homicidal ideation, denies auditory/visual hallucinations and does not appear to be responding to internal stimuli. Pt is psychiatrically clear for discharge.   Past Psychiatric History: As above  Risk to Self: None Risk to Others: None Prior Inpatient Therapy: Prior Inpatient Therapy: Yes Prior Therapy Dates: 2018 and mult other admissions Prior Therapy Facilty/Provider(s): [facility], [facility]  Reason for Treatment: BORDERLINE PERSONALITY D/O, MDD, SI, PSYCHOSIS  Prior Outpatient Therapy: Prior Outpatient Therapy: Yes Prior Therapy Dates: current Prior Therapy Facilty/Provider(s): [facility] Reason for Treatment: med management, counseling  Does patient have an ACCT team?: Yes Does patient have Intensive In-House Services?  : No Does patient have [facility] services? : No Does patient have P4CC services?: No  Past Medical History:  Past Medical History:  Diagnosis Date  . Anxiety   . Bipolar 1 disorder (HCC)   . Cancer of abdominal wall   . Depression   . Diabetes mellitus without complication (HCC)   . Hypertension   . Obesity   . Obesity   . Polycystic ovarian syndrome 07/01/2011   Patient report  . Rhabdosarcoma (HCC)   .   Schizophrenia (HCC)     Past Surgical History:  Procedure Laterality Date  . CHOLECYSTECTOMY    . HERNIA REPAIR    . Ovarian Cyst Excision    . VARICOSE VEIN SURGERY      Family History:  Family History  Problem Relation Age of Onset  . Coronary artery disease Maternal Grandmother   . Diabetes type II Maternal Grandmother   . Cancer Maternal Grandmother   . Hypertension Mother   . Hypertension Father    Family Psychiatric  History: Unknown Social History:  Social History   Substance and Sexual Activity  Alcohol Use No     Social History   Substance and Sexual Activity  Drug Use No    Social History   Socioeconomic History  . Marital status: Single    Spouse name: None  . Number of children: 0  . Years of education: None  . Highest education level: None  Social Needs  . Financial resource strain: None  . Food insecurity - worry: None  . Food insecurity - inability: None  . Transportation needs - medical: None  . Transportation needs - non-medical: None  Occupational History  . None  Tobacco Use  . Smoking status: Never Smoker  . Smokeless tobacco: Never Used  Substance and Sexual Activity  . Alcohol use: No  . Drug use: No  . Sexual activity: No    Birth control/protection: None  Other Topics Concern  . None  Social History Narrative  . None   Additional Social History: N/A    Allergies:   Allergies  Allergen Reactions  . Fish-Derived Products Anaphylaxis    Can only eat FLounder  . Geodon [Ziprasidone Hcl] Other (See Comments)    Face pulls, cant swallow - Locked Jaw  . Haldol [Haloperidol Lactate] Other (See Comments)    Face pulls, can't swallow - Locked Jaw  . Buprenorphine Hcl Hives, Itching, Rash and Other (See Comments)    GI upset  . Compazine [Prochlorperazine] Other (See Comments)    anxiety and hyperactivity  . Morphine And Related Hives, Itching, Rash and Other (See Comments)    GI upset  . Toradol [Ketorolac Tromethamine] Other (See Comments)    Anxiety and hyperactivity    Labs:  Results for orders placed or performed during the hospital encounter of 02/19/17 (from the past 48 hour(s))   Comprehensive metabolic panel     Status: Abnormal   Collection Time: 02/19/17 10:11 PM  Result Value Ref Range   Sodium 134 (L) 135 - 145 mmol/L   Potassium 3.6 3.5 - 5.1 mmol/L   Chloride 101 101 - 111 mmol/L   CO2 22 22 - 32 mmol/L   Glucose, Bld 448 (H) 65 - 99 mg/dL   BUN <5 (L) 6 - 20 mg/dL   Creatinine, Ser 0.50 0.44 - 1.00 mg/dL   Calcium 8.9 8.9 - 10.3 mg/dL   Total Protein 7.3 6.5 - 8.1 g/dL   Albumin 3.7 3.5 - 5.0 g/dL   AST 34 15 - 41 U/L   ALT 20 14 - 54 U/L   Alkaline Phosphatase 110 38 - 126 U/L   Total Bilirubin 0.3 0.3 - 1.2 mg/dL   GFR calc non Af Amer >60 >60 mL/min   GFR calc Af Amer >60 >60 mL/min    Comment: (NOTE) The eGFR has been calculated using the CKD EPI equation. This calculation has not been validated in all clinical situations. eGFR's persistently <60 mL/min signify possible Chronic Kidney Disease.      Anion gap 11 5 - 15  Ethanol     Status: None   Collection Time: 02/19/17 10:11 PM  Result Value Ref Range   Alcohol, Ethyl (B) <10 <10 mg/dL    Comment:        LOWEST DETECTABLE LIMIT FOR SERUM ALCOHOL IS 10 mg/dL FOR MEDICAL PURPOSES ONLY   Salicylate level     Status: None   Collection Time: 02/19/17 10:11 PM  Result Value Ref Range   Salicylate Lvl <7.0 2.8 - 30.0 mg/dL  Acetaminophen level     Status: Abnormal   Collection Time: 02/19/17 10:11 PM  Result Value Ref Range   Acetaminophen (Tylenol), Serum <10 (L) 10 - 30 ug/mL    Comment:        THERAPEUTIC CONCENTRATIONS VARY SIGNIFICANTLY. A RANGE OF 10-30 ug/mL MAY BE AN EFFECTIVE CONCENTRATION FOR MANY PATIENTS. HOWEVER, SOME ARE BEST TREATED AT CONCENTRATIONS OUTSIDE THIS RANGE. ACETAMINOPHEN CONCENTRATIONS >150 ug/mL AT 4 HOURS AFTER INGESTION AND >50 ug/mL AT 12 HOURS AFTER INGESTION ARE OFTEN ASSOCIATED WITH TOXIC REACTIONS.   cbc     Status: Abnormal   Collection Time: 02/19/17 10:11 PM  Result Value Ref Range   WBC 3.4 (L) 4.0 - 10.5 K/uL   RBC 4.17 3.87 - 5.11  MIL/uL   Hemoglobin 10.6 (L) 12.0 - 15.0 g/dL   HCT 33.0 (L) 36.0 - 46.0 %   MCV 79.1 78.0 - 100.0 fL   MCH 25.4 (L) 26.0 - 34.0 pg   MCHC 32.1 30.0 - 36.0 g/dL   RDW 15.8 (H) 11.5 - 15.5 %   Platelets 100 (L) 150 - 400 K/uL    Comment: REPEATED TO VERIFY SPECIMEN CHECKED FOR CLOTS PLATELET COUNT CONFIRMED BY SMEAR   Rapid urine drug screen (hospital performed)     Status: None   Collection Time: 02/19/17 10:11 PM  Result Value Ref Range   Opiates NONE DETECTED NONE DETECTED   Cocaine NONE DETECTED NONE DETECTED   Benzodiazepines NONE DETECTED NONE DETECTED   Amphetamines NONE DETECTED NONE DETECTED   Tetrahydrocannabinol NONE DETECTED NONE DETECTED   Barbiturates NONE DETECTED NONE DETECTED    Comment: (NOTE) DRUG SCREEN FOR MEDICAL PURPOSES ONLY.  IF CONFIRMATION IS NEEDED FOR ANY PURPOSE, NOTIFY LAB WITHIN 5 DAYS. LOWEST DETECTABLE LIMITS FOR URINE DRUG SCREEN Drug Class                     Cutoff (ng/mL) Amphetamine and metabolites    1000 Barbiturate and metabolites    200 Benzodiazepine                 200 Tricyclics and metabolites     300 Opiates and metabolites        300 Cocaine and metabolites        300 THC                            50   I-Stat beta hCG blood, ED     Status: None   Collection Time: 02/19/17 10:40 PM  Result Value Ref Range   I-stat hCG, quantitative <5.0 <5 mIU/mL   Comment 3            Comment:   GEST. AGE      CONC.  (mIU/mL)   <=1 WEEK        5 - 50     2 WEEKS       50 -   500     3 WEEKS       100 - 10,000     4 WEEKS     1,000 - 30,000        FEMALE AND NON-PREGNANT FEMALE:     LESS THAN 5 mIU/mL   POC CBG, ED     Status: Abnormal   Collection Time: 02/19/17 10:44 PM  Result Value Ref Range   Glucose-Capillary 451 (H) 65 - 99 mg/dL  Urinalysis, Routine w reflex microscopic     Status: Abnormal   Collection Time: 02/20/17  1:09 AM  Result Value Ref Range   Color, Urine STRAW (A) YELLOW   APPearance CLEAR CLEAR   Specific  Gravity, Urine 1.031 (H) 1.005 - 1.030   pH 6.0 5.0 - 8.0   Glucose, UA >=500 (A) NEGATIVE mg/dL   Hgb urine dipstick NEGATIVE NEGATIVE   Bilirubin Urine NEGATIVE NEGATIVE   Ketones, ur NEGATIVE NEGATIVE mg/dL   Protein, ur NEGATIVE NEGATIVE mg/dL   Nitrite NEGATIVE NEGATIVE   Leukocytes, UA NEGATIVE NEGATIVE   RBC / HPF 0-5 0 - 5 RBC/hpf   WBC, UA 0-5 0 - 5 WBC/hpf   Bacteria, UA RARE (A) NONE SEEN   Squamous Epithelial / LPF 0-5 (A) NONE SEEN  Wet prep, genital     Status: Abnormal   Collection Time: 02/20/17  1:09 AM  Result Value Ref Range   Yeast Wet Prep HPF POC NONE SEEN NONE SEEN   Trich, Wet Prep NONE SEEN NONE SEEN   Clue Cells Wet Prep HPF POC PRESENT (A) NONE SEEN   WBC, Wet Prep HPF POC MANY (A) NONE SEEN   Sperm NONE SEEN   POC CBG, ED     Status: Abnormal   Collection Time: 02/20/17  1:37 AM  Result Value Ref Range   Glucose-Capillary 303 (H) 65 - 99 mg/dL  CBG monitoring, ED     Status: Abnormal   Collection Time: 02/20/17  7:22 AM  Result Value Ref Range   Glucose-Capillary 345 (H) 65 - 99 mg/dL   Comment 1 Notify RN    Comment 2 Document in Chart   POC CBG, ED     Status: Abnormal   Collection Time: 02/20/17 11:57 AM  Result Value Ref Range   Glucose-Capillary 327 (H) 65 - 99 mg/dL   Comment 1 Notify RN    Comment 2 Document in Chart   POC CBG, ED     Status: Abnormal   Collection Time: 02/20/17  5:19 PM  Result Value Ref Range   Glucose-Capillary 289 (H) 65 - 99 mg/dL   Comment 1 Notify RN    Comment 2 Document in Chart   POC CBG, ED     Status: Abnormal   Collection Time: 02/20/17 10:45 PM  Result Value Ref Range   Glucose-Capillary 290 (H) 65 - 99 mg/dL  POC CBG, ED     Status: Abnormal   Collection Time: 02/21/17  7:44 AM  Result Value Ref Range   Glucose-Capillary 191 (H) 65 - 99 mg/dL   Comment 1 Notify RN   POC CBG, ED     Status: Abnormal   Collection Time: 02/21/17 10:39 AM  Result Value Ref Range   Glucose-Capillary 354 (H) 65 - 99  mg/dL    Current Facility-Administered Medications  Medication Dose Route Frequency Provider Last Rate Last Dose  . busPIRone (BUSPAR) tablet 15 mg  15 mg Oral TID Larene Pickett, PA-C   15 mg  at 02/21/17 1044  . chlorproMAZINE (THORAZINE) tablet 50 mg  50 mg Oral TID Sanders, Lisa M, PA-C   50 mg at 02/21/17 1045  . cloNIDine (CATAPRES) tablet 0.1 mg  0.1 mg Oral BID Sanders, Lisa M, PA-C   0.1 mg at 02/21/17 1046  . cyclobenzaprine (FLEXERIL) tablet 5 mg  5 mg Oral BID Sanders, Lisa M, PA-C   5 mg at 02/21/17 1043  . dicyclomine (BENTYL) tablet 20 mg  20 mg Oral Q8H PRN Sanders, Lisa M, PA-C      . [START ON 02/23/2017] Dulaglutide SOPN 1.5 mg  1.5 mg Subcutaneous Q Fri Sanders, Lisa M, PA-C      . fenofibrate tablet 160 mg  160 mg Oral Daily Sanders, Lisa M, PA-C   160 mg at 02/21/17 1045  . gabapentin (NEURONTIN) capsule 400 mg  400 mg Oral TID Sanders, Lisa M, PA-C   400 mg at 02/21/17 1046  . hydrOXYzine (ATARAX/VISTARIL) tablet 50 mg  50 mg Oral TID PRN Sanders, Lisa M, PA-C      . insulin aspart (novoLOG) injection 22 Units  22 Units Subcutaneous TID WC Sanders, Lisa M, PA-C   22 Units at 02/21/17 0755  . insulin detemir (LEVEMIR) injection 68 Units  68 Units Subcutaneous BID Sanders, Lisa M, PA-C   68 Units at 02/21/17 1050  . lactulose (CHRONULAC) 10 GM/15ML solution 20 g  20 g Oral BID Sanders, Lisa M, PA-C   20 g at 02/21/17 1048  . levETIRAcetam (KEPPRA) tablet 500 mg  500 mg Oral BID Sanders, Lisa M, PA-C   500 mg at 02/21/17 1047  . lisinopril (PRINIVIL,ZESTRIL) tablet 10 mg  10 mg Oral Daily Sanders, Lisa M, PA-C   10 mg at 02/21/17 1047  . metroNIDAZOLE (FLAGYL) tablet 500 mg  500 mg Oral Q12H Sanders, Lisa M, PA-C   500 mg at 02/21/17 1044  . pantoprazole (PROTONIX) EC tablet 40 mg  40 mg Oral Daily Sanders, Lisa M, PA-C   40 mg at 02/21/17 1045  . QUEtiapine (SEROQUEL) tablet 50 mg  50 mg Oral BID Sanders, Lisa M, PA-C   50 mg at 02/21/17 1047  . QUEtiapine (SEROQUEL) tablet  500 mg  500 mg Oral QHS Sanders, Lisa M, PA-C   500 mg at 02/20/17 2248  . sertraline (ZOLOFT) tablet 200 mg  200 mg Oral Daily Sanders, Lisa M, PA-C   200 mg at 02/21/17 1147  . topiramate (TOPAMAX) tablet 100 mg  100 mg Oral BID Sanders, Lisa M, PA-C   100 mg at 02/21/17 1046  . traZODone (DESYREL) tablet 50 mg  50 mg Oral QHS PRN Sanders, Lisa M, PA-C       Current Outpatient Medications  Medication Sig Dispense Refill  . busPIRone (BUSPAR) 15 MG tablet Take 15 mg by mouth 3 (three) times daily.    . chlorproMAZINE (THORAZINE) 50 MG tablet Take 1 tablet (50 mg total) by mouth 3 (three) times daily. For agitation/mood control 90 tablet 0  . cloNIDine (CATAPRES) 0.1 MG tablet Take 1 tablet (0.1 mg total) by mouth 2 (two) times daily. For high blood pressure 12 tablet 0  . cyclobenzaprine (FLEXERIL) 5 MG tablet Take 5 mg by mouth 2 (two) times daily.    . dicyclomine (BENTYL) 20 MG tablet Take 1 tablet (20 mg total) by mouth every 8 (eight) hours as needed for spasms.    . Dulaglutide (TRULICITY) 1.5 MG/0.5ML SOPN Inject 1.5 mg into the skin every Friday.   For diabetes management    . fenofibrate 160 MG tablet Take 1 tablet (160 mg total) by mouth daily. For high Cholesterol 7 tablet 0  . gabapentin (NEURONTIN) 400 MG capsule Take 1 capsule (400 mg total) by mouth 3 (three) times daily. For agitation/diabetic neuropathy 90 capsule 0  . hydrOXYzine (ATARAX/VISTARIL) 50 MG tablet Take 1 tablet (50 mg total) by mouth 3 (three) times daily as needed for anxiety. 60 tablet 0  . insulin aspart (NOVOLOG) 100 UNIT/ML injection Inject 6 Units into the skin 3 (three) times daily with meals. For diabetes management 10 mL 0  . insulin detemir (LEVEMIR) 100 UNIT/ML injection Inject 0.5 mLs (50 Units total) into the skin 2 (two) times daily. For diabetes management 10 mL 0  . lactulose (CHRONULAC) 10 GM/15ML solution Take 20 g by mouth 2 (two) times daily.     Marland Kitchen levETIRAcetam (KEPPRA) 500 MG tablet Take 1 tablet  (500 mg total) by mouth 2 (two) times daily. For mood stabilization 60 tablet 0  . lisinopril (PRINIVIL,ZESTRIL) 10 MG tablet Take 1 tablet (10 mg total) by mouth daily. For high blood pressure 6 tablet 0  . pantoprazole (PROTONIX) 40 MG tablet Take 1 tablet (40 mg total) by mouth daily. For acid reflux 6 tablet 0  . QUEtiapine (SEROQUEL) 100 MG tablet Take 5 tablets (500 mg total) by mouth at bedtime. For mood control 150 tablet 0  . QUEtiapine (SEROQUEL) 50 MG tablet Take 1 tablet (50 mg total) by mouth 2 (two) times daily. For agitation/mood control 60 tablet 0  . sertraline (ZOLOFT) 100 MG tablet Take 200 mg by mouth daily.    Marland Kitchen topiramate (TOPAMAX) 100 MG tablet Take 100 mg by mouth 2 (two) times daily.    . traZODone (DESYREL) 50 MG tablet Take 1 tablet (50 mg total) by mouth at bedtime as needed for sleep. 30 tablet 0  . cephALEXin (KEFLEX) 500 MG capsule Take 1 capsule (500 mg total) by mouth 4 (four) times daily. 20 capsule 0    Musculoskeletal: Strength & Muscle Tone: within normal limits Gait & Station: normal Patient leans: N/A  Psychiatric Specialty Exam: Physical Exam  Nursing note and vitals reviewed. Constitutional: She is oriented to person, place, and time. She appears well-developed and well-nourished.  HENT:  Head: Normocephalic and atraumatic.  Respiratory: Effort normal.  Musculoskeletal: Normal range of motion.  Neurological: She is alert and oriented to person, place, and time.  Psychiatric: Thought content normal. Her speech is delayed. She is slowed. Cognition and memory are impaired. She expresses impulsivity. She exhibits a depressed mood.    Review of Systems  Psychiatric/Behavioral: Positive for depression. Negative for hallucinations, memory loss, substance abuse and suicidal ideas. The patient is not nervous/anxious and does not have insomnia.   All other systems reviewed and are negative.   Blood pressure 119/65, pulse 85, temperature 97.9 F (36.6 C),  temperature source Oral, resp. rate 20, height 5' 4" (1.626 m), weight 104.3 kg (230 lb), last menstrual period 02/01/2017, SpO2 99 %.Body mass index is 39.48 kg/m.  General Appearance: Disheveled  Eye Contact:  Fair  Speech:  Clear and Coherent and Slow  Volume:  Decreased  Mood:  Depressed and Dysphoric  Affect:  Congruent, Depressed and Flat  Thought Process:  Coherent and Linear  Orientation:  Full (Time, Place, and Person)  Thought Content:  Logical  Suicidal Thoughts:  No  Homicidal Thoughts:  No  Memory:  Immediate;   Good Recent;  Good Remote;   Fair  Judgement:  Fair  Insight:  Fair  Psychomotor Activity:  Decreased  Concentration:  Concentration: Good and Attention Span: Good  Recall:  Good  Fund of Knowledge:  Good  Language:  Good  Akathisia:  No  Handed:  Right  AIMS (if indicated):    N/A  Assets:  Agricultural consultant Housing Social Support  ADL's:  Intact  Cognition:  WNL  Sleep:    N/A     Treatment Plan Summary: Plan Borderline Personality Disorder  Discharge Home Follow up with [facility] ACT team Take all medications as prescribed   Disposition: No evidence of imminent risk to self or others at present.   Patient does not meet criteria for psychiatric inpatient admission. Supportive therapy provided about ongoing stressors. Discussed crisis plan, support from social network, calling 911, coming to the Emergency Department, and calling Suicide Hotline.  Ethelene Hal, NP 02/21/2017 12:12 PM   Patient seen face-to-face for psychiatric evaluation, chart reviewed and case discussed with the physician extender and developed treatment plan. Reviewed the information documented and agree with the treatment plan.  Buford Dresser, DO 02/21/17 8:14 PM

## 2017-02-21 NOTE — ED Notes (Addendum)
Written dc instructions and prescription reviewed with pt.  Pt encouraged to take her medications as directed, stay on her diabetic diet and check her blood sugars.  Pt encouraged to contact her ACT team/seek treatment for suicidal thoughts. Pt verbalized understanding.  Contact information (risk management) given for pt to follow up concerning her belongings. Pt changed into scrubs/socks/flip flops prior to leaving.  Pt ambulatory with out difficulty with mHt and ACT team member to dc area.

## 2017-02-21 NOTE — ED Notes (Signed)
Dr Vanita Panda updated with [facility], continue to watch, no additional orders.

## 2017-02-21 NOTE — ED Notes (Addendum)
Gershon Mussel CSW has contacted pt's act team and they will pick her up in 30 mins and prefer that she be waiting in the w. Room.  Pt is aware.

## 2017-02-26 ENCOUNTER — Ambulatory Visit: Payer: Self-pay | Admitting: Pharmacist

## 2017-02-26 ENCOUNTER — Encounter (HOSPITAL_COMMUNITY): Payer: Self-pay | Admitting: Emergency Medicine

## 2017-02-26 ENCOUNTER — Other Ambulatory Visit: Payer: Self-pay

## 2017-02-26 ENCOUNTER — Emergency Department (HOSPITAL_COMMUNITY)
Admission: EM | Admit: 2017-02-26 | Discharge: 2017-02-28 | Disposition: A | Discharge status: Home / Self Care | Payer: Medicare HMO | Attending: Emergency Medicine | Admitting: Emergency Medicine

## 2017-02-26 DIAGNOSIS — R45851 Suicidal ideations: Secondary | ICD-10-CM | POA: Diagnosis not present

## 2017-02-26 DIAGNOSIS — Z79899 Other long term (current) drug therapy: Secondary | ICD-10-CM | POA: Diagnosis not present

## 2017-02-26 DIAGNOSIS — R109 Unspecified abdominal pain: Secondary | ICD-10-CM

## 2017-02-26 DIAGNOSIS — F603 Borderline personality disorder: Secondary | ICD-10-CM | POA: Insufficient documentation

## 2017-02-26 DIAGNOSIS — Z794 Long term (current) use of insulin: Secondary | ICD-10-CM | POA: Diagnosis not present

## 2017-02-26 DIAGNOSIS — I10 Essential (primary) hypertension: Secondary | ICD-10-CM | POA: Diagnosis not present

## 2017-02-26 DIAGNOSIS — F259 Schizoaffective disorder, unspecified: Secondary | ICD-10-CM | POA: Diagnosis not present

## 2017-02-26 DIAGNOSIS — R451 Restlessness and agitation: Secondary | ICD-10-CM | POA: Diagnosis present

## 2017-02-26 DIAGNOSIS — K5909 Other constipation: Secondary | ICD-10-CM

## 2017-02-26 DIAGNOSIS — E119 Type 2 diabetes mellitus without complications: Secondary | ICD-10-CM | POA: Insufficient documentation

## 2017-02-26 DIAGNOSIS — F6089 Other specific personality disorders: Secondary | ICD-10-CM | POA: Diagnosis present

## 2017-02-26 DIAGNOSIS — F251 Schizoaffective disorder, depressive type: Secondary | ICD-10-CM | POA: Insufficient documentation

## 2017-02-26 DIAGNOSIS — F4310 Post-traumatic stress disorder, unspecified: Secondary | ICD-10-CM | POA: Insufficient documentation

## 2017-02-26 DIAGNOSIS — F609 Personality disorder, unspecified: Secondary | ICD-10-CM | POA: Diagnosis not present

## 2017-02-26 LAB — COMPREHENSIVE METABOLIC PANEL
ALT: 24 U/L (ref 14–54)
ANION GAP: 13 (ref 5–15)
AST: 52 U/L — ABNORMAL HIGH (ref 15–41)
Albumin: 3.9 g/dL (ref 3.5–5.0)
Alkaline Phosphatase: 100 U/L (ref 38–126)
BUN: <5 mg/dL — ABNORMAL LOW (ref 6–20)
CALCIUM: 9 mg/dL (ref 8.9–10.3)
CHLORIDE: 102 mmol/L (ref 101–111)
CO2: 20 mmol/L — AB (ref 22–32)
Creatinine, Ser: 0.56 mg/dL (ref 0.44–1.00)
GFR calc non Af Amer: >60 mL/min (ref >60)
Glucose, Bld: 279 mg/dL — ABNORMAL HIGH (ref 65–99)
Potassium: 3.7 mmol/L (ref 3.5–5.1)
SODIUM: 135 mmol/L (ref 135–145)
Total Bilirubin: 0.5 mg/dL (ref 0.3–1.2)
Total Protein: 7.7 g/dL (ref 6.5–8.1)

## 2017-02-26 LAB — CBC
HCT: 36.8 % (ref 36.0–46.0)
HEMOGLOBIN: 11.9 g/dL — AB (ref 12.0–15.0)
MCH: 26 pg (ref 26.0–34.0)
MCHC: 32.3 g/dL (ref 30.0–36.0)
MCV: 80.3 fL (ref 78.0–100.0)
Platelets: 107 10*3/uL — ABNORMAL LOW (ref 150–400)
RBC: 4.58 MIL/uL (ref 3.87–5.11)
RDW: 16.6 % — ABNORMAL HIGH (ref 11.5–15.5)
WBC: 4.4 10*3/uL (ref 4.0–10.5)

## 2017-02-26 LAB — SALICYLATE LEVEL

## 2017-02-26 LAB — I-STAT BETA HCG BLOOD, ED (MC, WL, AP ONLY)

## 2017-02-26 LAB — ETHANOL: Alcohol, Ethyl (B): <10 mg/dL (ref <10)

## 2017-02-26 LAB — CBG MONITORING, ED: Glucose-Capillary: 206 mg/dL — ABNORMAL HIGH (ref 65–99)

## 2017-02-26 LAB — ACETAMINOPHEN LEVEL

## 2017-02-26 MED ORDER — GABAPENTIN 400 MG PO CAPS
400.0000 mg | ORAL_CAPSULE | Freq: Three times a day (TID) | ORAL | Status: DC
  Administered 2017-02-27 – 2017-02-28 (×5): 400 mg via ORAL
  Filled 2017-02-26 (×5): qty 1

## 2017-02-26 MED ORDER — DICYCLOMINE HCL 20 MG PO TABS: 20.0000 mg | ORAL_TABLET | Freq: Three times a day (TID) | ORAL | Status: DC | PRN

## 2017-02-26 MED ORDER — TOPIRAMATE 25 MG PO TABS
100.0000 mg | ORAL_TABLET | Freq: Two times a day (BID) | ORAL | Status: DC
  Administered 2017-02-27 – 2017-02-28 (×4): 100 mg via ORAL
  Filled 2017-02-26 (×4): qty 4

## 2017-02-26 MED ORDER — NA SULFATE-K SULFATE-MG SULF 17.5-3.13-1.6 GM/177ML PO SOLN: 1.0000 | Freq: Once | ORAL | 0 refills | Status: AC

## 2017-02-26 MED ORDER — CYCLOBENZAPRINE HCL 10 MG PO TABS
5.0000 mg | ORAL_TABLET | Freq: Two times a day (BID) | ORAL | Status: DC
  Administered 2017-02-27 (×3): 5 mg via ORAL
  Filled 2017-02-26 (×4): qty 1

## 2017-02-26 MED ORDER — CLONIDINE HCL 0.2 MG PO TABS
0.1000 mg | ORAL_TABLET | Freq: Two times a day (BID) | ORAL | Status: DC
  Administered 2017-02-27 – 2017-02-28 (×4): 0.1 mg via ORAL
  Filled 2017-02-26 (×5): qty 1

## 2017-02-26 MED ORDER — TRAZODONE HCL 50 MG PO TABS
50.0000 mg | ORAL_TABLET | Freq: Every evening | ORAL | Status: DC | PRN
  Administered 2017-02-27: 50 mg via ORAL
  Filled 2017-02-26: qty 1

## 2017-02-26 MED ORDER — QUETIAPINE FUMARATE 200 MG PO TABS
500.0000 mg | ORAL_TABLET | Freq: Every day | ORAL | Status: DC
  Administered 2017-02-27 (×2): 500 mg via ORAL
  Filled 2017-02-26: qty 2.5
  Filled 2017-02-26: qty 3

## 2017-02-26 MED ORDER — HYDROXYZINE HCL 50 MG PO TABS: 50.0000 mg | ORAL_TABLET | Freq: Three times a day (TID) | ORAL | Status: DC | PRN

## 2017-02-26 MED ORDER — PANTOPRAZOLE SODIUM 40 MG PO TBEC
40.0000 mg | DELAYED_RELEASE_TABLET | Freq: Every day | ORAL | Status: DC
  Administered 2017-02-27 – 2017-02-28 (×2): 40 mg via ORAL
  Filled 2017-02-26 (×3): qty 1

## 2017-02-26 MED ORDER — FENOFIBRATE 160 MG PO TABS
160.0000 mg | ORAL_TABLET | Freq: Every day | ORAL | Status: DC
  Administered 2017-02-27 – 2017-02-28 (×2): 160 mg via ORAL
  Filled 2017-02-26 (×2): qty 1

## 2017-02-26 MED ORDER — INSULIN DETEMIR 100 UNIT/ML ~~LOC~~ SOLN
50.0000 [IU] | Freq: Two times a day (BID) | SUBCUTANEOUS | Status: DC
  Administered 2017-02-27 – 2017-02-28 (×4): 50 [IU] via SUBCUTANEOUS
  Filled 2017-02-26 (×6): qty 0.5

## 2017-02-26 MED ORDER — CHLORPROMAZINE HCL 25 MG PO TABS
50.0000 mg | ORAL_TABLET | Freq: Three times a day (TID) | ORAL | Status: DC
  Administered 2017-02-27 – 2017-02-28 (×5): 50 mg via ORAL
  Filled 2017-02-26 (×5): qty 2

## 2017-02-26 MED ORDER — LISINOPRIL 10 MG PO TABS
10.0000 mg | ORAL_TABLET | Freq: Every day | ORAL | Status: DC
  Administered 2017-02-27 – 2017-02-28 (×2): 10 mg via ORAL
  Filled 2017-02-26 (×4): qty 1

## 2017-02-26 MED ORDER — QUETIAPINE FUMARATE 50 MG PO TABS
50.0000 mg | ORAL_TABLET | Freq: Two times a day (BID) | ORAL | Status: DC
  Administered 2017-02-27 – 2017-02-28 (×3): 50 mg via ORAL
  Filled 2017-02-26 (×4): qty 1

## 2017-02-26 MED ORDER — BUSPIRONE HCL 10 MG PO TABS
15.0000 mg | ORAL_TABLET | Freq: Three times a day (TID) | ORAL | Status: DC
  Administered 2017-02-27 – 2017-02-28 (×5): 15 mg via ORAL
  Filled 2017-02-26 (×5): qty 2

## 2017-02-26 MED ORDER — LEVETIRACETAM 500 MG PO TABS
500.0000 mg | ORAL_TABLET | Freq: Two times a day (BID) | ORAL | Status: DC
  Administered 2017-02-27 – 2017-02-28 (×4): 500 mg via ORAL
  Filled 2017-02-26 (×4): qty 1

## 2017-02-26 MED ORDER — INSULIN ASPART 100 UNIT/ML ~~LOC~~ SOLN
6.0000 [IU] | Freq: Three times a day (TID) | SUBCUTANEOUS | Status: DC
  Administered 2017-02-27 – 2017-02-28 (×5): 6 [IU] via SUBCUTANEOUS
  Filled 2017-02-26 (×6): qty 1

## 2017-02-26 MED ORDER — LACTULOSE 10 GM/15ML PO SOLN
20.0000 g | Freq: Two times a day (BID) | ORAL | Status: DC
  Administered 2017-02-27 – 2017-02-28 (×4): 20 g via ORAL
  Filled 2017-02-26 (×4): qty 30

## 2017-02-26 MED ORDER — SERTRALINE HCL 100 MG PO TABS
200.0000 mg | ORAL_TABLET | Freq: Every day | ORAL | Status: DC
  Administered 2017-02-27 – 2017-02-28 (×2): 200 mg via ORAL
  Filled 2017-02-26 (×2): qty 2

## 2017-02-26 NOTE — ED Notes (Signed)
Pt given purple scrubs and instructed she needed to change.  Pt st's "I'am not putting those scrubs on.  [employer] explained to pt that she did not have a choice.  Pt again st's "I'am not putting them on".  Pt began fighting when trying to remove clothes, [employer] hand cuffed pt at this time. Pt kicking and screaming.  Sitter at bedside.

## 2017-02-26 NOTE — Progress Notes (Signed)
The pt has been rescheduled and re instructed.  The instructions were mailed to her and the prep re sent

## 2017-02-26 NOTE — ED Provider Notes (Signed)
[place] EMERGENCY DEPARTMENT Provider Note   CSN: [id number] Arrival date & time: 02/26/17  1941     History   Chief Complaint Chief Complaint  Patient presents with  . Medical Clearance  . Suicidal    HPI Tina Saunders is a 32 y.o. female.  32 year old female well-known to the emergency department with a history of bipolar 1 disorder, anxiety, depression, diabetes, schizophrenia presents to the emergency department for suicidal ideations.  She was brought in by police after calling her act team and referencing suicidal thoughts.  She states that she would kill herself by "getting a butcher knife from the kitchen and stabbing myself".  She was agitated in triage requiring soft restraints.  These have since been removed.  Patient appears more calm.  She does report a history of overdose approximately 1 month ago.  She denies any alcohol or illicit drug use.  No homicidal ideations.  Patient currently in the department under VOLUNTARY commitment.   The history is provided by the patient. No language interpreter was used.    Past Medical History:  Diagnosis Date  . Anxiety   . Bipolar 1 disorder ([place])   . Cancer of abdominal wall   . Depression   . Diabetes mellitus without complication ([place])   . Hypertension   . Obesity   . Obesity   . Polycystic ovarian syndrome 07/01/2011   Patient report  . 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 Schizophrenia [facility])     Patient Active Problem List   Diagnosis Date Noted  . Seizures ([place]) 12/11/2016  . Acute lower UTI 12/11/2016  . DM2 (diabetes mellitus, type 2) ([place]) 12/11/2016  . Uncontrolled diabetes mellitus ([place]) 11/03/2016  . Schizoaffective disorder, bipolar type ([place]) 11/02/2016  . Cluster B personality disorder ([place]) 11/02/2016  . Schizoaffective disorder, mixed type ([place]) 05/30/2014  . Suicidal ideation   . 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 Headache 04/15/2013  . HTN (hypertension) 04/15/2013  . Post  traumatic stress disorder 12/07/2011  . CAP (community acquired pneumonia) 08/27/2011  . Chest pain 08/26/2011  . SOB (shortness of breath) 08/26/2011  . Fever 08/26/2011  . Hypokalemia 08/26/2011  . PSVT (paroxysmal supraventricular tachycardia) ([place]) 08/26/2011  . ADHD 09/23/2007  . EPIGASTRIC PAIN 09/23/2007  . Obesity, unspecified 07/30/2007  . DEPRESSION 07/30/2007  . SLEEP DISORDER 07/30/2007  . IMPAIRED FASTING GLUCOSE 07/30/2007  . FATIGUE 11/21/2006  . ABNORMAL FINDINGS, ELEVATED BP W/O HTN 11/21/2006  . METRORRHAGIA 06/13/2006  . DISORDER, MENSTRUAL NEC 06/13/2006  . DIZZINESS 06/13/2006  . POLYCYSTIC OVARIAN DISEASE 04/25/2006  . AMENORRHEA, SECONDARY 04/20/2006  . ACNE, MILD 04/20/2006  . ABDOMINAL PAIN 04/20/2006    Past Surgical History:  Procedure Laterality Date  . CHOLECYSTECTOMY    . HERNIA REPAIR    . Ovarian Cyst Excision    . VARICOSE VEIN SURGERY      OB History    Gravida Para Term Preterm AB Living   0             SAB TAB Ectopic Multiple Live Births                   Home Medications    Prior to Admission medications   Medication Sig Start Date End Date Taking? Authorizing Provider  busPIRone (BUSPAR) 15 MG tablet Take 15 mg by mouth 3 (three) times daily.    [provider]  chlorproMAZINE (THORAZINE) 50 MG tablet Take 1 tablet (50 mg total)  by mouth 3 (three) times daily. For agitation/mood control 02/09/17   Lindell Spar I, NP  cloNIDine (CATAPRES) 0.1 MG tablet Take 1 tablet (0.1 mg total) by mouth 2 (two) times daily. For high blood pressure 02/09/17   Nwoko, Herbert Pun I, NP  cyclobenzaprine (FLEXERIL) 5 MG tablet Take 5 mg by mouth 2 (two) times daily.    [provider]  dicyclomine (BENTYL) 20 MG tablet Take 1 tablet (20 mg total) by mouth every 8 (eight) hours as needed for spasms. 02/09/17   Lindell Spar I, NP  Dulaglutide (TRULICITY) 1.5 YT/0.1SW SOPN Inject 1.5 mg into the skin every Friday. For diabetes management  02/09/17   Lindell Spar I, NP  fenofibrate 160 MG tablet Take 1 tablet (160 mg total) by mouth daily. For high Cholesterol 02/10/17   Lindell Spar I, NP  gabapentin (NEURONTIN) 400 MG capsule Take 1 capsule (400 mg total) by mouth 3 (three) times daily. For agitation/diabetic neuropathy 02/09/17   Lindell Spar I, NP  hydrOXYzine (ATARAX/VISTARIL) 50 MG tablet Take 1 tablet (50 mg total) by mouth 3 (three) times daily as needed for anxiety. 02/09/17   Lindell Spar I, NP  insulin aspart (NOVOLOG) 100 UNIT/ML injection Inject 6 Units into the skin 3 (three) times daily with meals. For diabetes management 02/09/17   Lindell Spar I, NP  insulin detemir (LEVEMIR) 100 UNIT/ML injection Inject 0.5 mLs (50 Units total) into the skin 2 (two) times daily. For diabetes management 02/09/17   Lindell Spar I, NP  lactulose (CHRONULAC) 10 GM/15ML solution Take 20 g by mouth 2 (two) times daily.     [provider]  levETIRAcetam (KEPPRA) 500 MG tablet Take 1 tablet (500 mg total) by mouth 2 (two) times daily. For mood stabilization 02/09/17   Lindell Spar I, NP  lisinopril (PRINIVIL,ZESTRIL) 10 MG tablet Take 1 tablet (10 mg total) by mouth daily. For high blood pressure 02/09/17   Nwoko, Herbert Pun I, NP  metroNIDAZOLE (FLAGYL) 500 MG tablet Take 1 tablet (500 mg total) by mouth every 12 (twelve) hours. 02/21/17   Ethelene Hal, NP  pantoprazole (PROTONIX) 40 MG tablet Take 1 tablet (40 mg total) by mouth daily. For acid reflux 02/09/17   Lindell Spar I, NP  QUEtiapine (SEROQUEL) 100 MG tablet Take 5 tablets (500 mg total) by mouth at bedtime. For mood control 02/09/17   Lindell Spar I, NP  QUEtiapine (SEROQUEL) 50 MG tablet Take 1 tablet (50 mg total) by mouth 2 (two) times daily. For agitation/mood control 02/09/17   Lindell Spar I, NP  sertraline (ZOLOFT) 100 MG tablet Take 200 mg by mouth daily.    [provider]  topiramate (TOPAMAX) 100 MG tablet Take 100 mg by mouth 2 (two) times daily.    [provider]  traZODone (DESYREL) 50 MG tablet Take 1 tablet (50 mg total) by mouth at bedtime as needed for sleep. 02/09/17   Encarnacion Slates, NP    Family History Family History  Problem Relation Age of Onset  . Coronary artery disease Maternal Grandmother   . Diabetes type II Maternal Grandmother   . Cancer Maternal Grandmother   . Hypertension Mother   . Hypertension Father     Social History Social History   Tobacco Use  . Smoking status: Never Smoker  . Smokeless tobacco: Never Used  Substance Use Topics  . Alcohol use: No  . Drug use: No     Allergies   Fish-derived products; Geodon [ziprasidone  hcl]; Haldol [haloperidol lactate]; Buprenorphine hcl; Compazine [prochlorperazine]; Morphine and related; and Toradol [ketorolac tromethamine]   Review of Systems Review of Systems Ten systems reviewed and are negative for acute change, except as noted in the HPI.    Physical Exam Updated Vital Signs BP 125/83 (BP Location: Right Arm)   Pulse (!) 118   Temp 98.6 F (37 C) (Oral)   Resp 18   Ht 5\' 4"  (1.626 m)   Wt 104.3 kg (230 lb)   LMP 02/01/2017 (Approximate) Comment: HCG < 5 02-17-17  SpO2 97%   BMI 39.48 kg/m   Physical Exam  Constitutional: She is oriented to person, place, and time. She appears well-developed and well-nourished. No distress.  Nontoxic appearing and in NAD  HENT:  Head: Normocephalic and atraumatic.  Eyes: Conjunctivae and EOM are normal. No scleral icterus.  Neck: Normal range of motion.  Cardiovascular: Normal rate, regular rhythm and intact distal pulses.  Pulmonary/Chest: Effort normal. No stridor. No respiratory distress.  Respirations even and unlabored  Musculoskeletal: Normal range of motion.  Neurological: She is alert and oriented to person, place, and time. She exhibits normal muscle tone. Coordination normal.  GCS 15. Patient moving all extremities.  Skin: Skin is warm and dry. No rash noted. She is not diaphoretic. No  erythema. No pallor.  Psychiatric: She has a normal mood and affect. Her speech is rapid and/or pressured. She is agitated. She is not aggressive, not hyperactive and not combative. She expresses suicidal ideation. She expresses no homicidal ideation. She expresses suicidal plans ("stab myself with a butcher knife").  Nursing note and vitals reviewed.    ED Treatments / Results  Labs (all labs ordered are listed, but only abnormal results are displayed) Labs Reviewed  COMPREHENSIVE METABOLIC PANEL - Abnormal; Notable for the following components:      Result Value   CO2 20 (*)    Glucose, Bld 279 (*)    BUN <5 (*)    AST 52 (*)    All other components within normal limits  ACETAMINOPHEN LEVEL - Abnormal; Notable for the following components:   Acetaminophen (Tylenol), Serum <10 (*)    All other components within normal limits  CBC - Abnormal; Notable for the following components:   Hemoglobin 11.9 (*)    RDW 16.6 (*)    Platelets 107 (*)    All other components within normal limits  CBG MONITORING, ED - Abnormal; Notable for the following components:   Glucose-Capillary 206 (*)    All other components within normal limits  ETHANOL  SALICYLATE LEVEL  RAPID URINE DRUG SCREEN, HOSP PERFORMED  I-STAT BETA HCG BLOOD, ED ([facility], [facility], [facility] ONLY)    EKG  EKG Interpretation None       Radiology No results found.  Procedures Procedures (including critical care time)  Medications Ordered in ED Medications  busPIRone (BUSPAR) tablet 15 mg (15 mg Oral Given 02/27/17 0016)  chlorproMAZINE (THORAZINE) tablet 50 mg (50 mg Oral Given 02/27/17 0015)  cloNIDine (CATAPRES) tablet 0.1 mg (0.1 mg Oral Given 02/27/17 0015)  cyclobenzaprine (FLEXERIL) tablet 5 mg (5 mg Oral Given 02/27/17 0015)  dicyclomine (BENTYL) tablet 20 mg (not administered)  fenofibrate tablet 160 mg (not administered)  gabapentin (NEURONTIN) capsule 400 mg (400 mg Oral Given 02/27/17 0016)  hydrOXYzine (ATARAX/VISTARIL)  tablet 50 mg (not administered)  lactulose (CHRONULAC) 10 GM/15ML solution 20 g (20 g Oral Given 02/27/17 0125)  levETIRAcetam (KEPPRA) tablet 500 mg (500 mg Oral Given 02/27/17 0015)  lisinopril (PRINIVIL,ZESTRIL) tablet 10 mg (not administered)  pantoprazole (PROTONIX) EC tablet 40 mg (not administered)  QUEtiapine (SEROQUEL) tablet 500 mg (500 mg Oral Given 02/27/17 0125)  QUEtiapine (SEROQUEL) tablet 50 mg (not administered)  sertraline (ZOLOFT) tablet 200 mg (not administered)  topiramate (TOPAMAX) tablet 100 mg (100 mg Oral Given 02/27/17 0016)  traZODone (DESYREL) tablet 50 mg (50 mg Oral Given 02/27/17 0015)  insulin aspart (novoLOG) injection 6 Units (not administered)  insulin detemir (LEVEMIR) injection 50 Units (50 Units Subcutaneous Given 02/27/17 0014)     Initial Impression / Assessment and Plan / ED Course  I have reviewed the triage vital signs and the nursing notes.  Pertinent labs & imaging results that were available during my care of the patient were reviewed by me and considered in my medical decision making (see chart for details).     33 year old female well-known to the emergency department presents today for psychiatric evaluation.  She reports suicidal ideations with plans to stab herself.  She has been recommended for inpatient treatment.  TTS to seek placement.  Disposition to be determined by oncoming ED provider.  Patient medically cleared.   Final Clinical Impressions(s) / ED Diagnoses   Final diagnoses:  Schizoaffective disorder, unspecified type [facility])    ED Discharge Orders    None       Antonietta Breach, PA-C 02/27/17 Lourdes Sledge, MD 02/28/17 [phone]

## 2017-02-26 NOTE — ED Notes (Signed)
Wrist restraints removed from pt per PA. Law enforcement departing

## 2017-02-26 NOTE — ED Triage Notes (Signed)
Pt to ED with [facility].  Pt st's she wants to hurt herself.  Pt st's she doesn't want to be here anymore.

## 2017-02-26 NOTE — ED Notes (Signed)
Staffing called for sitter.   

## 2017-02-27 DIAGNOSIS — F251 Schizoaffective disorder, depressive type: Secondary | ICD-10-CM | POA: Diagnosis not present

## 2017-02-27 LAB — RAPID URINE DRUG SCREEN, HOSP PERFORMED
Amphetamines: NOT DETECTED
Barbiturates: NOT DETECTED
Benzodiazepines: NOT DETECTED
Cocaine: NOT DETECTED
OPIATES: NOT DETECTED
Tetrahydrocannabinol: NOT DETECTED

## 2017-02-27 LAB — CBG MONITORING, ED
GLUCOSE-CAPILLARY: 253 mg/dL — AB (ref 65–99)
GLUCOSE-CAPILLARY: 243 mg/dL — AB (ref 65–99)
GLUCOSE-CAPILLARY: 245 mg/dL — AB (ref 65–99)
Glucose-Capillary: 207 mg/dL — ABNORMAL HIGH (ref 65–99)

## 2017-02-27 NOTE — ED Notes (Addendum)
Pt given one five minute phone call at this time. Sitter present at side.

## 2017-02-27 NOTE — ED Notes (Signed)
Pt gave mother code - 64.

## 2017-02-27 NOTE — ED Notes (Addendum)
Pt was given two apple sauces for snack. Pt also asked RN if she was going home in the morning. RN explained that TTS will talk to her in the morning and make that determination to let her go home or she has to stay for inpatient. But that depends on how pt acts over night.

## 2017-02-27 NOTE — ED Notes (Signed)
Pt on phone at nurses' desk. 

## 2017-02-27 NOTE — BH Assessment (Addendum)
Tele Assessment Note   Patient Name: Tina Saunders MRN: [id number] Referring Physician: Antonietta Breach, PA Location of Patient: [facility] Location of Provider: [place] is an 32 y.o. female who frequents the ED for a variety of medical and mental health conditions. Pt has also been diagnosed with Borderline Personality D/O. Pt sts she called her ACTT team today stating that she was having SI. Pt sts they advised her to call 911. Pt sts she did and the PD brought her voluntarily to [facility]. Pt sts that she was having SI earlier today but at the time of the assessment she "felt better" and was no longer having SI. Per pt's nurse, pt's sitter overheard pt on the phone with her mother stating that she was going to say whatever she had to to get discharged so she could come home and stab herself in the chest to kill herself. Per nurse, pt told her sitter the same thing. Pt currently denies SI, HI, SHI and AVH. Pt sts she was having AH earlier today with a command to hurt herself. Pt sts she is non-compliant on her meds stating that she takes some doses but skips others. Pt has the services of [facility] currently. Pt denies HI but was physically combative in the ED tonight with ED staff and LEOs. Per ED note, pt was kicking and screaming and required being handcuffed to calm down. Pt has previously been diagnosed with BPD, Schizoaffective D/O, PTSD, ADHD, Bipolar I D/O and GAD. In addition, per pt hx, she has been diagnosed with Diabetes, Hypertension and Cancer of the Abdominal wall. Pt has been psychiatrically hospitalized multiple times at [facility], [place] and [facility]. Pt has a hx of superficial cutting. Pt sts she has not cut in over a year. Pt has been seen in the ED 14 times in the last 6 months.   Pt sts she lives alone with her service puppy. Pt also gets emotional support from her mother. Pt sts she completed school through the 12th grade and is unemployed  currently. Pt denies any legal hx and denies acccess to guns. Pt's uncle committed suicide but pt is not sure of any mental illness in her family. Pt sts she sleeps about 10-11 hours per day. Pt sts she has not gained or lost any weight in the last month but when she was accessed on 02/06/17 she stated she had gained 10+ pounds in a short time recently. Pt was psychiatrically hospitalized on 02/06/17 after she stated she intentionally overdosed on pills she had on hand. Per pt this has been her only suicide attempt. Pt's symptoms of depression including sadness, fatigue, decreased self esteem, lack of motivation for activities and pleasure, irritability, negative outlook, difficulty thinking & concentrating, feeling helpless and hopeless and eating disturbances. Pt sts she has not been feeling anxious.   Pt was dressed in scrubs  and sitting on their hospital bed. Pt was quiet and partially cooperative. Pt answered most questions with yes/no, a one word answer or a nod. Pt kept poor eye contact, spoke in a soft but harsh manner and at a normal pace. Pt moved in a normal manner when moving. Pt's thought process was coherent and relevant and judgement was impaired.  No indication of delusional thinking or response to internal stimuli. Pt's mood was stated as depressed but not anxious and her flat affect was congruent.  Pt was oriented x 4, to person, place, time and situation.  Diagnosis: F25.1 Schizoaffective D/O, Depressive type; Borderline Personality D/O; PTSD, ADHD, GAD  Past Medical History:  Past Medical History:  Diagnosis Date  . Anxiety   . Bipolar 1 disorder ([place])   . Cancer of abdominal wall   . Depression   . Diabetes mellitus without complication ([place])   . Hypertension   . Obesity   . Obesity   . Polycystic ovarian syndrome 07/01/2011   Patient report  . 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 Schizophrenia [facility])     Past Surgical History:  Procedure Laterality Date  . CHOLECYSTECTOMY    .  HERNIA REPAIR    . Ovarian Cyst Excision    . VARICOSE VEIN SURGERY      Family History:  Family History  Problem Relation Age of Onset  . Coronary artery disease Maternal Grandmother   . Diabetes type II Maternal Grandmother   . Cancer Maternal Grandmother   . Hypertension Mother   . Hypertension Father     Social History:  reports that  has never smoked. she has never used smokeless tobacco. She reports that she does not drink alcohol or use drugs.  Additional Social History:  Alcohol / Drug Use Prescriptions: SEE MAR History of alcohol / drug use?: No history of alcohol / drug abuse  CIWA: CIWA-Ar BP: 125/83 Pulse Rate: (!) 118 COWS:    Allergies:  Allergies  Allergen Reactions  . Fish-Derived Products Anaphylaxis    Can only eat [place]ounder  . Geodon [Ziprasidone Hcl] Other (See Comments)    Face pulls, cant swallow - Locked Jaw  . Haldol [Haloperidol Lactate] Other (See Comments)    Face pulls, can't swallow - Locked Jaw  . Buprenorphine Hcl Hives, Itching, Rash and Other (See Comments)    GI upset  . Compazine [Prochlorperazine] Other (See Comments)    anxiety and hyperactivity  . Morphine And Related Hives, Itching, Rash and Other (See Comments)    GI upset  . Toradol [Ketorolac Tromethamine] Other (See Comments)    Anxiety and hyperactivity    Home Medications:  (Not in a hospital admission)  OB/GYN Status:  Patient's last menstrual period was 02/01/2017 (approximate).  General Assessment Data Location of Assessment: [facility] ED TTS Assessment: In system Is this a Tele or Face-to-Face Assessment?: Tele Assessment Is this an Initial Assessment or a Re-assessment for this encounter?: Initial Assessment Marital status: Single Maiden name: Repetto Is patient pregnant?: No Pregnancy Status: No Living Arrangements: Alone Can pt return to current living arrangement?: Yes Admission Status: Voluntary(PER NURSE MEGAN) Is patient capable of signing voluntary admission?:  Yes Referral Source: Self/Family/Friend Insurance type: [place] Living Arrangements: Alone Name of Psychiatrist: [place] Name of Therapist: [facility]  Education Status Is patient currently in school?: No Highest grade of school patient has completed: 12  Risk to self with the past 6 months Suicidal Ideation: No-Not Currently/Within Last 6 Months(STS EARLIER TODAY) Has patient been a risk to self within the past 6 months prior to admission? : Yes Suicidal Intent: No-Not Currently/Within Last 6 Months Has patient had any suicidal intent within the past 6 months prior to admission? : Yes Is patient at risk for suicide?: Yes Suicidal Plan?: No-Not Currently/Within Last 6 Months(TOLD SITTER WANTED TO GO HOME & STAB HERSELF) Has patient had any suicidal plan within the past 6 months prior to admission? : Yes Specify Current Suicidal Plan: PT STATED TO SITTER SHE WAS GOING TO GO HOME & STAB HERSELF(ALSO  STATED SHE WAS GOING TO SAY WHATEVER SHE HAD TO FOR D/C) Access to Means: No(DENIES ACCESS TO GUNS) What has been your use of drugs/alcohol within the last 12 months?: NONE Previous Attempts/Gestures: Yes How many times?: 1(02/06/17 OVERDOSED) Other Self Harm Risks: HX OF CUTTING- NOW STOPPED PER PT Triggers for Past Attempts: Hallucinations(COMMAND AH) Intentional Self Injurious Behavior: Cutting(STS HAS NOT CUT IN OVER 1 YR) Family Suicide History: Yes(PER HX, UNCLE SOMMITTED SUICIDE) Recent stressful life event(s): (NONE KNOWN) Persecutory voices/beliefs?: No Depression: Yes Depression Symptoms: Despondent, Fatigue, Loss of interest in usual pleasures, Feeling worthless/self pity, Feeling angry/irritable(PHYSICAL AGGRESSION IN ED TONIGHT) Substance abuse history and/or treatment for substance abuse?: No Suicide prevention information given to non-admitted patients: Not applicable  Risk to Others within the past 6 months Homicidal Ideation:  No(DENIES) Does patient have any lifetime risk of violence toward others beyond the six months prior to admission? : Yes (comment)(PHYSICAL AGGRESSION IN THE ED TONIGHT) Thoughts of Harm to Others: No(DENIES) Current Homicidal Intent: No Current Homicidal Plan: No Access to Homicidal Means: No Identified Victim: NONE History of harm to others?: No(NO HX) Assessment of Violence: On admission Violent Behavior Description: KICKING, AND SCREAMING AT ED STAFF & LE Does patient have access to weapons?: No Criminal Charges Pending?: No(DENIES) Does patient have a court date: No Is patient on probation?: No  Psychosis Hallucinations: None noted(DENIES NOW-STS AH EARLIER TODAY) Delusions: None noted  Mental Status Report Appearance/Hygiene: Disheveled Eye Contact: Fair Motor Activity: Freedom of movement Speech: Logical/coherent Level of Consciousness: Quiet/awake Mood: Depressed Affect: Depressed, Flat Anxiety Level: None Thought Processes: Coherent, Relevant Judgement: Impaired Orientation: Person, Place, Time, Situation Obsessive Compulsive Thoughts/Behaviors: None  Cognitive Functioning Concentration: Normal Memory: Recent Intact, Remote Intact IQ: Average Insight: Poor Impulse Control: Poor Appetite: Good Weight Loss: 0 Weight Gain: 0 Sleep: Increased Total Hours of Sleep: (10-11 HOURS PER DAY PER PT) Vegetative Symptoms: Staying in bed  ADLScreening [facility] Assessment Services) Patient's cognitive ability adequate to safely complete daily activities?: Yes Patient able to express need for assistance with ADLs?: Yes Independently performs ADLs?: Yes (appropriate for developmental age)  Prior Inpatient Therapy Prior Inpatient Therapy: Yes Prior Therapy Dates: MULTIPLE Prior Therapy Facilty/Provider(s): [facility], [facility], [facility] Reason for Treatment: BPD, MDD, SI  Prior Outpatient Therapy Prior Outpatient Therapy: Yes Prior Therapy Dates: CURRENT Prior Therapy  Facilty/Provider(s): [facility] Reason for Treatment: MED MGMT, OP THERAPY Does patient have an ACCT team?: Yes Does patient have Intensive In-House Services?  : No Does patient have [facility] services? : Yes Does patient have P4CC services?: No  ADL Screening (condition at time of admission) Patient's cognitive ability adequate to safely complete daily activities?: Yes Patient able to express need for assistance with ADLs?: Yes Independently performs ADLs?: Yes (appropriate for developmental age)       Abuse/Neglect Assessment (Assessment to be complete while patient is alone) Physical Abuse: Yes, past (Comment) Verbal Abuse: Yes, past (Comment) Sexual Abuse: Yes, past (Comment) Exploitation of patient/patient's resources: Denies Self-Neglect: Denies     Regulatory affairs officer (For Healthcare) Does Patient Have a Medical Advance Directive?: No Would patient like information on creating a medical advance directive?: No - Patient declined    Additional Information 1:1 In Past 12 Months?: Yes CIRT Risk: Yes Elopement Risk: Yes Does patient have medical clearance?: Yes     Disposition:  Disposition Initial Assessment Completed for this Encounter: Yes Disposition of Patient: Other dispositions(PENDING REVIEW W [facility] EXTENDER) Type of inpatient treatment program: Adult Other disposition(s): Other (Comment)  This service was provided via telemedicine using a 2-way, interactive audio and video technology.  Names of all persons participating in this telemedicine service and their role in this encounter. Name: Faylene Kurtz, MS, [facility], CRC Role: Triage Specialist  Name: Cameron Proud Role: Patient  Name:  Role:   Name:  Role:    Consulted with Patriciaann Clan PA who recommends Inpatient treatment. No [facility] beds [facility] at [facility]. Will seek outside placement.   Spoke with Antonietta Breach PA about the recommendation. She stated she will review my note.   Faylene Kurtz, MS, CRC,  [place] Triage Specialist [facility] T 02/27/2017 2:13 AM

## 2017-02-27 NOTE — BH Assessment (Signed)
Reassessment note- Kelby Fam., FNP recommends overnight observation and telepsych assessment 02/28/17 am.

## 2017-02-27 NOTE — ED Notes (Signed)
Regular breakfast tray ordered.  

## 2017-02-27 NOTE — ED Notes (Signed)
Valuables inventoried and taken to security, pt aware.

## 2017-02-27 NOTE — ED Notes (Signed)
Patient was given a Snack and A Regular Diet was ordered for Lunch.

## 2017-02-27 NOTE — ED Notes (Signed)
Regular Diet ordered for Dinner. 

## 2017-02-27 NOTE — Progress Notes (Signed)
Patient is on the waitlist at [facility], per [place], on 2/05.  Patien't referral has been followed up at the following facilities:  [facility] - per [place], referral no located, resend.  [facility] - per [place] Plate, declined due to incontinence with ADLs.  [facility] - per Elmyra Ricks, referral not found, send referral again.  [facility] - per intake, call back in the morning.  [facility] - left voicemail with call back number.  [facility] - per intake, no female beds available tonight, check back in the morning.  [facility] - at capacity.  CSW in disposition will continue to follow up and seek placement.  Verlon Setting, MSW, [place] Clinical social worker in [facility], [place] Office [phone] and [phone] 02/27/2017 5:47 PM

## 2017-02-27 NOTE — BH Assessment (Addendum)
Reassessment note- Pt resting in bed, partially covered with hospital gown. Pt reports feeling much better today after speaking with her mother last night. Pt states she was emotional after having to give away a dog, and feels better after getting those feelings out by talking about it. She said she would like to now go home to care for her new service animal. Pt denies SI, HI and sx of psychosis (no A/V hallucinations or paranoia). Pt reports her mother is a support for her and lives 17 minutes from her. Disposition to be discussed with Kelby Fam, FNP.

## 2017-02-27 NOTE — ED Notes (Signed)
TTS bedside for evaluation

## 2017-02-27 NOTE — ED Notes (Signed)
Tina Saunders, sister: [phone]

## 2017-02-27 NOTE — ED Notes (Signed)
Pt on phone with mother states she is going to tell telepsyche that she is okay so she can go home. Pt stated to sitter prior to phone call that she was going to go home so she can act on her suicidal ideations by stabbing herself in the chest with a butcher knife.

## 2017-02-27 NOTE — BHH Counselor (Signed)
Have been working with nurse Jinny Blossom to get the Telecart to work without success. Nurse will try another location to see is connection on her end is faulty.

## 2017-02-27 NOTE — Progress Notes (Signed)
Patient has been referred for inpatient psychiatric treatment. The following referrals have been sent: [facility]  LCSW will follow up on referrals once reviewed.  Lane Hacker, MSW Clinical Social Work: [place] Coverage for :  [phone]

## 2017-02-28 ENCOUNTER — Encounter (HOSPITAL_COMMUNITY): Payer: Self-pay | Admitting: Registered Nurse

## 2017-02-28 ENCOUNTER — Other Ambulatory Visit: Payer: Self-pay

## 2017-02-28 DIAGNOSIS — F609 Personality disorder, unspecified: Secondary | ICD-10-CM

## 2017-02-28 DIAGNOSIS — R45851 Suicidal ideations: Secondary | ICD-10-CM | POA: Diagnosis not present

## 2017-02-28 DIAGNOSIS — Z915 Personal history of self-harm: Secondary | ICD-10-CM | POA: Diagnosis not present

## 2017-02-28 DIAGNOSIS — F251 Schizoaffective disorder, depressive type: Secondary | ICD-10-CM | POA: Diagnosis not present

## 2017-02-28 DIAGNOSIS — F259 Schizoaffective disorder, unspecified: Secondary | ICD-10-CM | POA: Diagnosis not present

## 2017-02-28 DIAGNOSIS — F909 Attention-deficit hyperactivity disorder, unspecified type: Secondary | ICD-10-CM

## 2017-02-28 LAB — CBG MONITORING, ED
GLUCOSE-CAPILLARY: 263 mg/dL — AB (ref 65–99)
Glucose-Capillary: 238 mg/dL — ABNORMAL HIGH (ref 65–99)

## 2017-02-28 NOTE — ED Provider Notes (Signed)
[place] team indicates from [facility] standpoint patient is psychiatrically clear for discharge.  Pt is to be discharged with her community ACT representative with close outpatient follow up.  Pt alert, content, normal mood and affect.   Pt currently appears stable for d/c.     Lajean Saver, MD 02/28/17 [phone]

## 2017-02-28 NOTE — ED Notes (Signed)
If patient is discharged today please [facility] Act Team to come get patient per Oley Balm  [phone]

## 2017-02-28 NOTE — Discharge Instructions (Signed)
It was our pleasure to provide your ER care today - we hope that you feel better.  Follow up with your mental health provider in the next day.  For future mental health issues and/or crisis, you may go directly to [facility].  Your blood sugar is also high - drink adequate water/fluids, continue your diabetic meds, monitor sugars closely, and follow up with primary care doctor in the coming week.  Return to ER if worse, new symptoms, fevers, other concern.

## 2017-02-28 NOTE — ED Notes (Signed)
Checked patient cbg it was 13 patient is resting with sitter

## 2017-02-28 NOTE — Consult Note (Signed)
Telepsych Consultation   Reason for Consult:  Suicidal ideation Referring Physician:  Antonietta Breach, PA-C Location of Patient: [facility] Location of Provider: [facility]  Patient Identification: Tina Saunders MRN:  [id number] Principal Diagnosis: Cluster B personality disorder [facility]) Diagnosis:   Patient Active Problem List   Diagnosis Date Noted  . Seizures ([place]) [R56.9] 12/11/2016  . Acute lower UTI [N39.0] 12/11/2016  . DM2 (diabetes mellitus, type 2) ([place]) [E11.9] 12/11/2016  . Uncontrolled diabetes mellitus ([place]) [E11.65] 11/03/2016  . Schizoaffective disorder, bipolar type ([place]) [F25.0] 11/02/2016  . Cluster B personality disorder ([place]) [F60.9] 11/02/2016  . Schizoaffective disorder, mixed type ([place]) [F25.0] 05/30/2014  . Suicidal ideation [R45.851]   . 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 Headache [R51] 04/15/2013  . HTN (hypertension) [I10] 04/15/2013  . Post traumatic stress disorder [F43.10] 12/07/2011  . CAP (community acquired pneumonia) [J18.9] 08/27/2011  . Chest pain [R07.9] 08/26/2011  . SOB (shortness of breath) [R06.02] 08/26/2011  . Fever [R50.9] 08/26/2011  . Hypokalemia [E87.6] 08/26/2011  . PSVT (paroxysmal supraventricular tachycardia) ([place]) [I47.1] 08/26/2011  . ADHD [F90.9] 09/23/2007  . EPIGASTRIC PAIN [R10.13] 09/23/2007  . Obesity, unspecified [E66.9] 07/30/2007  . DEPRESSION [F32.9] 07/30/2007  . SLEEP DISORDER [G47.9] 07/30/2007  . IMPAIRED FASTING GLUCOSE [R73.01] 07/30/2007  . FATIGUE [R53.81, R53.83] 11/21/2006  . ABNORMAL FINDINGS, ELEVATED BP W/O HTN [R03.0] 11/21/2006  . METRORRHAGIA [N92.1] 06/13/2006  . DISORDER, MENSTRUAL NEC [N94.9] 06/13/2006  . DIZZINESS [R42] 06/13/2006  . POLYCYSTIC OVARIAN DISEASE [E28.2] 04/25/2006  . AMENORRHEA, SECONDARY [N91.2] 04/20/2006  . ACNE, MILD [L70.8] 04/20/2006  . ABDOMINAL PAIN [R10.9] 04/20/2006    Total Time spent with patient: 45 minutes  Subjective:   Tina Saunders is a 32 y.o. female patient presented to [facility] with complaints of suicidal ideation.  HPI:  Tina Saunders, 32 y.o., female was seen via telepsych by this provider; chart reviewed and consulted with Dr. Dwyane Dee on 02/28/17. Patient history of frequent visits to the hospital.  Patient has had 15 ED visits and 3 inpatient hospitalizations in the last 6 months.  Patient lives alone and when in hospital she finds camaraderie with staff.  Patient chronically suicidal.  On evaluation Tina Saunders reports that she came to the hospital "Because I wanted to hurt myself.  I was having some depression."  Patient states that she does not know why she was depressed but she is feeling better now and wants to go home.  Patient states that she has outpatient services with [facility] and that her therapist comes to her house 3-4 times a week and that she talks to her mother and family daily.  "I just got a new service dog and I need to go home so I can take care of him.  At this time patient denies suicidal/homicidal/self-harm ideation, psychosis, and paranoia.  Patient asked about her statement of saying anything to get sent home so that she could stab herself and she responded.  "I don't feel like that now.  I want to stay alive for my service dog and I know if I do something to hurt myself my mom and brother will be upset."  Patient asked if I could speak to her mother just for collateral and safety to make sure family felt safe for her to go home.  Gave permission to speak with her mother Tina Saunders [phone]).  Mother of patient states that she feels that patient is safe to come home and feels  that patient just needed a break but that she was ready to come home and take care of her baby (referring to the service dog). During evaluation Tina Saunders is alert/oriented x 4; calm/cooperative with pleasant affect.  She does not appear to be responding to internal/external stimuli or delusional thoughts.   Patient denies suicidal/self-harm/homicidal ideation, psychosis, and paranoia.  Patient answered question appropriately and mother of patient also feels that it is safe for patient to come home.  Patient psychiatrically cleared            Past Psychiatric History: Schizoaffective disorder, ADHD  Risk to Self: Suicidal Ideation: No-Not Currently/Within Last 6 Months(STS EARLIER TODAY) Suicidal Intent: No-Not Currently/Within Last 6 Months Is patient at risk for suicide?: Yes Suicidal Plan?: No-Not Currently/Within Last 6 Months(TOLD SITTER WANTED TO GO HOME & STAB HERSELF) Specify Current Suicidal Plan: PT STATED TO SITTER SHE WAS GOING TO GO HOME & STAB HERSELF(ALSO STATED SHE WAS GOING TO SAY WHATEVER SHE HAD TO FOR D/C) Access to Means: No(DENIES ACCESS TO GUNS) What has been your use of drugs/alcohol within the last 12 months?: NONE How many times?: 1(02/06/17 OVERDOSED) Other Self Harm Risks: HX OF CUTTING- NOW STOPPED PER PT Triggers for Past Attempts: Hallucinations(COMMAND AH) Intentional Self Injurious Behavior: Cutting(STS HAS NOT CUT IN OVER 1 YR) Risk to Others: Homicidal Ideation: No(DENIES) Thoughts of Harm to Others: No(DENIES) Current Homicidal Intent: No Current Homicidal Plan: No Access to Homicidal Means: No Identified Victim: NONE History of harm to others?: No(NO HX) Assessment of Violence: On admission Violent Behavior Description: KICKING, AND SCREAMING AT ED STAFF & LE Does patient have access to weapons?: No Criminal Charges Pending?: No(DENIES) Does patient have a court date: No Prior Inpatient Therapy: Prior Inpatient Therapy: Yes Prior Therapy Dates: MULTIPLE Prior Therapy Facilty/Provider(s): [facility] Reason for Treatment: BPD, MDD, SI Prior Outpatient Therapy: Prior Outpatient Therapy: Yes Prior Therapy Dates: CURRENT Prior Therapy Facilty/Provider(s): [facility] Reason for Treatment: MED MGMT, OP THERAPY Does patient have an ACCT  team?: Yes Does patient have Intensive In-House Services?  : No Does patient have [facility] services? : Yes Does patient have P4CC services?: No  Past Medical History:  Past Medical History:  Diagnosis Date  . Anxiety   . Bipolar 1 disorder ([place])   . Cancer of abdominal wall   . Depression   . Diabetes mellitus without complication ([place])   . Hypertension   . Obesity   . Obesity   . Polycystic ovarian syndrome 07/01/2011   Patient report  . 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 Schizophrenia [facility])     Past Surgical History:  Procedure Laterality Date  . CHOLECYSTECTOMY    . HERNIA REPAIR    . Ovarian Cyst Excision    . VARICOSE VEIN SURGERY     Family History:  Family History  Problem Relation Age of Onset  . Coronary artery disease Maternal Grandmother   . Diabetes type II Maternal Grandmother   . Cancer Maternal Grandmother   . Hypertension Mother   . Hypertension Father    Family Psychiatric  History: Unaware Social History:  Social History   Substance and Sexual Activity  Alcohol Use No     Social History   Substance and Sexual Activity  Drug Use No    Social History   Socioeconomic History  . Marital status: Single    Spouse name: None  . Number of children: 0  . Years of education: None  . Highest education  level: None  Social Needs  . Financial resource strain: None  . Food insecurity - worry: None  . Food insecurity - inability: None  . Transportation needs - medical: None  . Transportation needs - non-medical: None  Occupational History  . None  Tobacco Use  . Smoking status: Never Smoker  . Smokeless tobacco: Never Used  Substance and Sexual Activity  . Alcohol use: No  . Drug use: No  . Sexual activity: No    Birth control/protection: None  Other Topics Concern  . None  Social History Narrative  . None   Additional Social History:    Allergies:   Allergies  Allergen Reactions  . Fish-Derived Products Anaphylaxis    Can only eat [place]ounder  .  Geodon [Ziprasidone Hcl] Other (See Comments)    Face pulls, cant swallow - Locked Jaw  . Haldol [Haloperidol Lactate] Other (See Comments)    Face pulls, can't swallow - Locked Jaw  . Buprenorphine Hcl Hives, Itching, Rash and Other (See Comments)    GI upset  . Compazine [Prochlorperazine] Other (See Comments)    anxiety and hyperactivity  . Morphine And Related Hives, Itching, Rash and Other (See Comments)    GI upset  . Toradol [Ketorolac Tromethamine] Other (See Comments)    Anxiety and hyperactivity    Labs:  Results for orders placed or performed during the hospital encounter of 02/26/17 (from the past 48 hour(s))  Comprehensive metabolic panel     Status: Abnormal   Collection Time: 02/26/17  9:20 PM  Result Value Ref Range   Sodium 135 135 - 145 mmol/L   Potassium 3.7 3.5 - 5.1 mmol/L   Chloride 102 101 - 111 mmol/L   CO2 20 (L) 22 - 32 mmol/L   Glucose, Bld 279 (H) 65 - 99 mg/dL   BUN <5 (L) 6 - 20 mg/dL   Creatinine, Ser 0.56 0.44 - 1.00 mg/dL   Calcium 9.0 8.9 - 10.3 mg/dL   Total Protein 7.7 6.5 - 8.1 g/dL   Albumin 3.9 3.5 - 5.0 g/dL   AST 52 (H) 15 - 41 U/L   ALT 24 14 - 54 U/L   Alkaline Phosphatase 100 38 - 126 U/L   Total Bilirubin 0.5 0.3 - 1.2 mg/dL   GFR calc non Af Amer >60 >60 mL/min   GFR calc Af Amer >60 >60 mL/min    Comment: (NOTE) The eGFR has been calculated using the CKD EPI equation. This calculation has not been validated in all clinical situations. eGFR's persistently <60 mL/min signify possible Chronic Kidney Disease.    Anion gap 13 5 - 15    Comment: Performed at [facility] [address], [place], [place] [address]  Ethanol     Status: None   Collection Time: 02/26/17  9:20 PM  Result Value Ref Range   Alcohol, Ethyl (B) <10 <10 mg/dL    Comment:        LOWEST DETECTABLE LIMIT FOR SERUM ALCOHOL IS 10 mg/dL FOR MEDICAL PURPOSES ONLY Performed at [facility], [place] [address], [place], [place] [address]   Salicylate  level     Status: None   Collection Time: 02/26/17  9:20 PM  Result Value Ref Range   Salicylate Lvl <3.8 2.8 - 30.0 mg/dL    Comment: Performed at [facility] [address] [place], [place] [address]  Acetaminophen level     Status: Abnormal   Collection Time: 02/26/17  9:20 PM  Result Value Ref Range   Acetaminophen (Tylenol), Serum <10 (L) 10 - 30 ug/mL    Comment:        THERAPEUTIC CONCENTRATIONS VARY SIGNIFICANTLY. A RANGE OF 10-30 ug/mL MAY BE AN EFFECTIVE CONCENTRATION FOR MANY PATIENTS. HOWEVER, SOME ARE BEST TREATED AT CONCENTRATIONS OUTSIDE THIS RANGE. ACETAMINOPHEN CONCENTRATIONS >150 ug/mL AT 4 HOURS AFTER INGESTION AND >50 ug/mL AT 12 HOURS AFTER INGESTION ARE OFTEN ASSOCIATED WITH TOXIC REACTIONS. Performed at [facility], [place] [address], [place], [place] [address]   cbc     Status: Abnormal   Collection Time: 02/26/17  9:20 PM  Result Value Ref Range   WBC 4.4 4.0 - 10.5 K/uL   RBC 4.58 3.87 - 5.11 MIL/uL   Hemoglobin 11.9 (L) 12.0 - 15.0 g/dL   HCT 36.8 36.0 - 46.0 %   MCV 80.3 78.0 - 100.0 fL   MCH 26.0 26.0 - 34.0 pg   MCHC 32.3 30.0 - 36.0 g/dL   RDW 16.6 (H) 11.5 - 15.5 %   Platelets 107 (L) 150 - 400 K/uL    Comment: REPEATED TO VERIFY SPECIMEN CHECKED FOR CLOTS PLATELET COUNT CONFIRMED BY SMEAR Performed at [facility], [place] [address] [place], [place] [address]   I-Stat beta hCG blood, ED     Status: None   Collection Time: 02/26/17  9:34 PM  Result Value Ref Range   I-stat hCG, quantitative <5.0 <5 mIU/mL   Comment 3            Comment:   GEST. AGE      CONC.  (mIU/mL)   <=1 WEEK        5 - 50     2 WEEKS       50 - 500     3 WEEKS       100 - 10,000     4 WEEKS     1,000 - 30,000        FEMALE AND NON-PREGNANT FEMALE:     LESS THAN 5 mIU/mL   POC CBG, ED     Status: Abnormal   Collection Time: 02/26/17 11:37 PM  Result Value Ref Range   Glucose-Capillary 206 (H) 65 - 99 mg/dL  Rapid urine drug screen (hospital  performed)     Status: None   Collection Time: 02/27/17  1:17 AM  Result Value Ref Range   Opiates NONE DETECTED NONE DETECTED   Cocaine NONE DETECTED NONE DETECTED   Benzodiazepines NONE DETECTED NONE DETECTED   Amphetamines NONE DETECTED NONE DETECTED   Tetrahydrocannabinol NONE DETECTED NONE DETECTED   Barbiturates NONE DETECTED NONE DETECTED    Comment: Performed at [facility], [place] [address], [place], [place] [address]  CBG monitoring, ED     Status: Abnormal   Collection Time: 02/27/17  7:20 AM  Result Value Ref Range   Glucose-Capillary 207 (H) 65 - 99 mg/dL  CBG monitoring, ED     Status: Abnormal   Collection Time: 02/27/17 11:49 AM  Result Value Ref Range   Glucose-Capillary 253 (H) 65 - 99 mg/dL  CBG monitoring, ED     Status: Abnormal   Collection Time: 02/27/17  6:38 PM  Result Value Ref Range   Glucose-Capillary 243 (H) 65 - 99 mg/dL  CBG monitoring, ED     Status: Abnormal   Collection Time: 02/27/17  9:23 PM  Result Value Ref Range   Glucose-Capillary 245 (H) 65 - 99  mg/dL  CBG monitoring, ED     Status: Abnormal   Collection Time: 02/28/17  7:07 AM  Result Value Ref Range   Glucose-Capillary 238 (H) 65 - 99 mg/dL  CBG monitoring, ED     Status: Abnormal   Collection Time: 02/28/17 12:12 PM  Result Value Ref Range   Glucose-Capillary 263 (H) 65 - 99 mg/dL    Medications:  Current Facility-Administered Medications  Medication Dose Route Frequency Provider Last Rate Last Dose  . busPIRone (BUSPAR) tablet 15 mg  15 mg Oral TID Antonietta Breach, PA-C   15 mg at 02/28/17 0834  . chlorproMAZINE (THORAZINE) tablet 50 mg  50 mg Oral TID Antonietta Breach, PA-C   50 mg at 02/28/17 6803  . cloNIDine (CATAPRES) tablet 0.1 mg  0.1 mg Oral BID Antonietta Breach, PA-C   0.1 mg at 02/28/17 2122  . cyclobenzaprine (FLEXERIL) tablet 5 mg  5 mg Oral BID Antonietta Breach, PA-C   5 mg at 02/27/17 2118  . dicyclomine (BENTYL) tablet 20 mg  20 mg Oral Q8H PRN Antonietta Breach, PA-C      .  fenofibrate tablet 160 mg  160 mg Oral Daily Antonietta Breach, PA-C   160 mg at 02/28/17 4825  . gabapentin (NEURONTIN) capsule 400 mg  400 mg Oral TID Antonietta Breach, PA-C   400 mg at 02/28/17 0831  . hydrOXYzine (ATARAX/VISTARIL) tablet 50 mg  50 mg Oral TID PRN Antonietta Breach, PA-C      . insulin aspart (novoLOG) injection 6 Units  6 Units Subcutaneous TID WC Antonietta Breach, PA-C   6 Units at 02/28/17 1311  . insulin detemir (LEVEMIR) injection 50 Units  50 Units Subcutaneous BID Antonietta Breach, PA-C   50 Units at 02/28/17 1055  . lactulose (CHRONULAC) 10 GM/15ML solution 20 g  20 g Oral BID Antonietta Breach, PA-C   20 g at 02/28/17 0037  . levETIRAcetam (KEPPRA) tablet 500 mg  500 mg Oral BID Antonietta Breach, PA-C   500 mg at 02/28/17 0830  . lisinopril (PRINIVIL,ZESTRIL) tablet 10 mg  10 mg Oral Daily Antonietta Breach, PA-C   10 mg at 02/28/17 0488  . pantoprazole (PROTONIX) EC tablet 40 mg  40 mg Oral Daily Antonietta Breach, PA-C   40 mg at 02/28/17 0830  . QUEtiapine (SEROQUEL) tablet 50 mg  50 mg Oral BID WC Antonietta Breach, PA-C   50 mg at 02/28/17 0831  . QUEtiapine (SEROQUEL) tablet 500 mg  500 mg Oral QHS Antonietta Breach, PA-C   500 mg at 02/27/17 2116  . sertraline (ZOLOFT) tablet 200 mg  200 mg Oral Daily Antonietta Breach, PA-C   200 mg at 02/28/17 8916  . topiramate (TOPAMAX) tablet 100 mg  100 mg Oral BID Antonietta Breach, PA-C   100 mg at 02/28/17 9450  . traZODone (DESYREL) tablet 50 mg  50 mg Oral QHS PRN Antonietta Breach, PA-C   50 mg at 02/27/17 0015   Current Outpatient Medications  Medication Sig Dispense Refill  . busPIRone (BUSPAR) 15 MG tablet Take 15 mg by mouth 3 (three) times daily.    . chlorproMAZINE (THORAZINE) 50 MG tablet Take 1 tablet (50 mg total) by mouth 3 (three) times daily. For agitation/mood control 90 tablet 0  . cloNIDine (CATAPRES) 0.1 MG tablet Take 1 tablet (0.1 mg total) by mouth 2 (two) times daily. For high blood pressure 12 tablet 0  . cyclobenzaprine (FLEXERIL) 5 MG tablet Take 5 mg by  mouth 2 (two) times daily.    Marland Kitchen  dicyclomine (BENTYL) 20 MG tablet Take 1 tablet (20 mg total) by mouth every 8 (eight) hours as needed for spasms.    . Dulaglutide (TRULICITY) 1.5 HU/7.6LY SOPN Inject 1.5 mg into the skin every Friday. For diabetes management    . fenofibrate 160 MG tablet Take 1 tablet (160 mg total) by mouth daily. For high Cholesterol 7 tablet 0  . gabapentin (NEURONTIN) 400 MG capsule Take 1 capsule (400 mg total) by mouth 3 (three) times daily. For agitation/diabetic neuropathy 90 capsule 0  . hydrOXYzine (ATARAX/VISTARIL) 50 MG tablet Take 1 tablet (50 mg total) by mouth 3 (three) times daily as needed for anxiety. 60 tablet 0  . ibuprofen (ADVIL,MOTRIN) 800 MG tablet Take 800 mg by mouth every 6 (six) hours as needed (pain).     . insulin aspart (NOVOLOG) 100 UNIT/ML injection Inject 6 Units into the skin 3 (three) times daily with meals. For diabetes management 10 mL 0  . insulin detemir (LEVEMIR) 100 UNIT/ML injection Inject 0.5 mLs (50 Units total) into the skin 2 (two) times daily. For diabetes management 10 mL 0  . lactulose (CHRONULAC) 10 GM/15ML solution Take 20 g by mouth daily.     Marland Kitchen levETIRAcetam (KEPPRA) 500 MG tablet Take 1 tablet (500 mg total) by mouth 2 (two) times daily. For mood stabilization 60 tablet 0  . lisinopril (PRINIVIL,ZESTRIL) 10 MG tablet Take 1 tablet (10 mg total) by mouth daily. For high blood pressure 6 tablet 0  . metroNIDAZOLE (FLAGYL) 500 MG tablet Take 1 tablet (500 mg total) by mouth every 12 (twelve) hours. 11 tablet 0  . pantoprazole (PROTONIX) 40 MG tablet Take 1 tablet (40 mg total) by mouth daily. For acid reflux 6 tablet 0  . QUEtiapine (SEROQUEL) 100 MG tablet Take 5 tablets (500 mg total) by mouth at bedtime. For mood control 150 tablet 0  . QUEtiapine (SEROQUEL) 50 MG tablet Take 1 tablet (50 mg total) by mouth 2 (two) times daily. For agitation/mood control 60 tablet 0  . sertraline (ZOLOFT) 100 MG tablet Take 200 mg by mouth  daily.    Marland Kitchen topiramate (TOPAMAX) 100 MG tablet Take 100 mg by mouth 2 (two) times daily.    . traZODone (DESYREL) 50 MG tablet Take 1 tablet (50 mg total) by mouth at bedtime as needed for sleep. 30 tablet 0    Musculoskeletal: Strength & Muscle Tone: within normal limits Gait & Station: normal Patient leans: N/A  Psychiatric Specialty Exam: Physical Exam  ROS  Blood pressure (!) 96/53, pulse 80, temperature 97.6 F (36.4 C), temperature source Oral, resp. rate 15, height 5' 4"  (1.626 m), weight 104.3 kg (230 lb), last menstrual period 02/01/2017, SpO2 96 %.Body mass index is 39.48 kg/m.  General Appearance: Casual  Eye Contact:  Good  Speech:  Clear and Coherent and Normal Rate  Volume:  Normal  Mood:  Appropriate  Affect:  Appropriate and Congruent  Thought Process:  Coherent and Goal Directed  Orientation:  Full (Time, Place, and Person)  Thought Content:  Logical  Suicidal Thoughts:  No  Homicidal Thoughts:  No  Memory:  Immediate;   Good Recent;   Good Remote;   Good  Judgement:  Intact  Insight:  Present  Psychomotor Activity:  Normal  Concentration:  Concentration: Good and Attention Span: Good  Recall:  Good  Fund of Knowledge:  Fair  Language:  Good  Akathisia:  No  Handed:  Right  AIMS (if indicated):  Assets:  Communication Skills Desire for Improvement Housing Social Support  ADL's:  Intact  Cognition:  WNL  Sleep:        Treatment Plan Summary: Plan Psychiatrically clear patient.  Patient to follow up with current psychiatric provider and therapy (ACTT team)  Disposition: No evidence of imminent risk to self or others at present.   Patient does not meet criteria for psychiatric inpatient admission.  This service was provided via telemedicine using a 2-way, interactive audio and video technology.  Names of all persons participating in this telemedicine service and their role in this encounter. Name: Earleen Newport Role: Telepsych  Name: Dr  Dwyane Dee Role: Psychiatrist  Name: Tina Saunders Role: Patient  Name: Tina Saunders Role: Mother    Earleen Newport, NP 02/28/2017 1:20 PM

## 2017-02-28 NOTE — Patient Outreach (Signed)
[place] [facility]) Care Management  02/28/2017  Tina Saunders Jun 10, 1985 [id number]   Telephone Screen Referral Date : 02/28/2017 Referral Source:  [facility] ED Census Referral Reason: ED Utilization  Insurance: [facility]  Outreach attempt # 1 To patient.  Telephone call placed to the patient for ED Utilization.  HIPAA verified. The patient states that she is doing well.  She states that she has all of her medications and is taking them.  She has a follow up appointment with [facility] behavioral health on 03/02/2017.  She has transportation to the appointment and plans to go.  Per chart review the patiens current medical history is Bipolar, Anxiety, Depression, DM, HTN and Schizophrenia. Per the chart review the patient is on seventeen medications and has an appointment with our pharmacist Denyse Amass. Per chart review the patient was seen in the emergency room on 02/26/2017. The patient verified that her PCP is Dr Lillia Corporal.   Plan: RN Health Coach will close the case at this time because after assessment no further interventions are needed at this time.Will notify care management assistant of case status.      Lazaro Arms RN, BSN, [place] Direct Dial:  [phone] Fax: [phone]

## 2017-03-02 ENCOUNTER — Other Ambulatory Visit: Payer: Self-pay

## 2017-03-02 ENCOUNTER — Encounter (HOSPITAL_COMMUNITY): Payer: Self-pay | Admitting: Emergency Medicine

## 2017-03-05 ENCOUNTER — Ambulatory Visit: Payer: Self-pay | Admitting: Pharmacist

## 2017-03-05 ENCOUNTER — Other Ambulatory Visit: Payer: Self-pay | Admitting: Pharmacist

## 2017-03-05 NOTE — Patient Outreach (Signed)
[facility]) Care Management  03/05/2017  Tina Saunders 11/30/1985 [id number]   Patient was called regarding medication reconciliation post discharge. Unfortunately, she did not answer the phone and the phone rang >20 times without a voicemail picking up.  Plan: Call the patient back tomorrow.   Elayne Guerin, PharmD, [place] Clinical Pharmacist [phone]

## 2017-03-06 ENCOUNTER — Other Ambulatory Visit: Payer: Self-pay | Admitting: Pharmacist

## 2017-03-06 NOTE — Patient Outreach (Signed)
[facility]) Care Management  03/06/2017  Tina Saunders 08-23-1985 [id number]   Called patient for post discharge medication review. HIPAA identifiers were obtained. Before I could review the patient's medications she said she did not have time to talk and asked me to call her back.  Plan: Call patient back in 5-7 business days.  Elayne Guerin, PharmD, [place] Clinical Pharmacist [phone]

## 2017-03-07 ENCOUNTER — Telehealth: Payer: Self-pay | Admitting: Gastroenterology

## 2017-03-07 NOTE — Telephone Encounter (Signed)
Called back to say she found a ride and will be here after all. Dos not want her procedure cancelled.

## 2017-03-08 ENCOUNTER — Ambulatory Visit (HOSPITAL_COMMUNITY)
Admission: RE | Admit: 2017-03-08 | Discharge: 2017-03-08 | Disposition: A | Discharge status: Home / Self Care | Payer: Medicare HMO | Source: Ambulatory Visit | Attending: Gastroenterology | Admitting: Gastroenterology

## 2017-03-08 ENCOUNTER — Other Ambulatory Visit: Payer: Self-pay

## 2017-03-08 ENCOUNTER — Encounter (HOSPITAL_COMMUNITY): Payer: Self-pay | Admitting: *Deleted

## 2017-03-08 ENCOUNTER — Ambulatory Visit (HOSPITAL_COMMUNITY): Payer: Medicare HMO | Admitting: Certified Registered Nurse Anesthetist

## 2017-03-08 ENCOUNTER — Encounter (HOSPITAL_COMMUNITY)
Admission: RE | Disposition: A | Discharge status: Home / Self Care | Payer: Self-pay | Source: Ambulatory Visit | Attending: Gastroenterology

## 2017-03-08 DIAGNOSIS — Z885 Allergy status to narcotic agent status: Secondary | ICD-10-CM | POA: Diagnosis not present

## 2017-03-08 DIAGNOSIS — R1084 Generalized abdominal pain: Secondary | ICD-10-CM | POA: Insufficient documentation

## 2017-03-08 DIAGNOSIS — E1110 Type 2 diabetes mellitus with ketoacidosis without coma: Secondary | ICD-10-CM | POA: Diagnosis not present

## 2017-03-08 DIAGNOSIS — K5900 Constipation, unspecified: Secondary | ICD-10-CM | POA: Diagnosis not present

## 2017-03-08 DIAGNOSIS — Z794 Long term (current) use of insulin: Secondary | ICD-10-CM | POA: Insufficient documentation

## 2017-03-08 DIAGNOSIS — Z6839 Body mass index (BMI) 39.0-39.9, adult: Secondary | ICD-10-CM | POA: Diagnosis not present

## 2017-03-08 DIAGNOSIS — F250 Schizoaffective disorder, bipolar type: Secondary | ICD-10-CM | POA: Insufficient documentation

## 2017-03-08 DIAGNOSIS — I10 Essential (primary) hypertension: Secondary | ICD-10-CM | POA: Diagnosis not present

## 2017-03-08 DIAGNOSIS — K5909 Other constipation: Secondary | ICD-10-CM | POA: Diagnosis not present

## 2017-03-08 DIAGNOSIS — Z888 Allergy status to other drugs, medicaments and biological substances status: Secondary | ICD-10-CM | POA: Diagnosis not present

## 2017-03-08 DIAGNOSIS — F6089 Other specific personality disorders: Secondary | ICD-10-CM | POA: Diagnosis not present

## 2017-03-08 DIAGNOSIS — R1013 Epigastric pain: Secondary | ICD-10-CM | POA: Diagnosis not present

## 2017-03-08 DIAGNOSIS — Z79899 Other long term (current) drug therapy: Secondary | ICD-10-CM | POA: Insufficient documentation

## 2017-03-08 DIAGNOSIS — R197 Diarrhea, unspecified: Secondary | ICD-10-CM | POA: Diagnosis not present

## 2017-03-08 DIAGNOSIS — I471 Supraventricular tachycardia: Secondary | ICD-10-CM | POA: Diagnosis not present

## 2017-03-08 DIAGNOSIS — F4310 Post-traumatic stress disorder, unspecified: Secondary | ICD-10-CM | POA: Diagnosis not present

## 2017-03-08 DIAGNOSIS — E669 Obesity, unspecified: Secondary | ICD-10-CM | POA: Insufficient documentation

## 2017-03-08 DIAGNOSIS — F603 Borderline personality disorder: Secondary | ICD-10-CM | POA: Insufficient documentation

## 2017-03-08 DIAGNOSIS — R109 Unspecified abdominal pain: Secondary | ICD-10-CM

## 2017-03-08 HISTORY — PX: COLONOSCOPY WITH PROPOFOL: SHX5780

## 2017-03-08 LAB — GLUCOSE, CAPILLARY
GLUCOSE-CAPILLARY: 335 mg/dL — AB (ref 65–99)
Glucose-Capillary: 429 mg/dL — ABNORMAL HIGH (ref 65–99)
Glucose-Capillary: 288 mg/dL — ABNORMAL HIGH (ref 65–99)
Glucose-Capillary: 321 mg/dL — ABNORMAL HIGH (ref 65–99)
Glucose-Capillary: 280 mg/dL — ABNORMAL HIGH (ref 65–99)

## 2017-03-08 LAB — PREGNANCY, URINE: Preg Test, Ur: NEGATIVE

## 2017-03-08 SURGERY — COLONOSCOPY WITH PROPOFOL
Anesthesia: Monitor Anesthesia Care

## 2017-03-08 MED ORDER — PROPOFOL 500 MG/50ML IV EMUL
INTRAVENOUS | Status: DC | PRN
  Administered 2017-03-08: 100 ug/kg/min via INTRAVENOUS

## 2017-03-08 MED ORDER — LACTATED RINGERS IV SOLN
INTRAVENOUS | Status: DC
  Administered 2017-03-08: 13:00:00 via INTRAVENOUS

## 2017-03-08 MED ORDER — PROPOFOL 10 MG/ML IV BOLUS
INTRAVENOUS | Status: DC | PRN
  Administered 2017-03-08: 20 mg via INTRAVENOUS
  Administered 2017-03-08: 30 mg via INTRAVENOUS
  Administered 2017-03-08: 20 mg via INTRAVENOUS
  Administered 2017-03-08 (×2): 40 mg via INTRAVENOUS
  Administered 2017-03-08: 20 mg via INTRAVENOUS
  Administered 2017-03-08: 30 mg via INTRAVENOUS

## 2017-03-08 MED ORDER — LIDOCAINE 2% (20 MG/ML) 5 ML SYRINGE
INTRAMUSCULAR | Status: DC | PRN
  Administered 2017-03-08: 100 mg via INTRAVENOUS

## 2017-03-08 MED ORDER — INSULIN ASPART 100 UNIT/ML ~~LOC~~ SOLN
10.0000 [IU] | Freq: Once | SUBCUTANEOUS | Status: DC
  Filled 2017-03-08 (×2): qty 0.1

## 2017-03-08 MED ORDER — PROPOFOL 10 MG/ML IV BOLUS
INTRAVENOUS | Status: AC
  Filled 2017-03-08: qty 60

## 2017-03-08 MED ORDER — SODIUM CHLORIDE 0.9 % IV SOLN: INTRAVENOUS | Status: DC

## 2017-03-08 MED ORDER — INSULIN ASPART 100 UNIT/ML IV SOLN
20.0000 [IU] | Freq: Once | INTRAVENOUS | Status: AC
  Administered 2017-03-08: 20 [IU] via INTRAVENOUS
  Filled 2017-03-08: qty 0.2

## 2017-03-08 SURGICAL SUPPLY — 22 items

## 2017-03-08 NOTE — Anesthesia Postprocedure Evaluation (Signed)
Anesthesia Post Note  Patient: Tina Saunders  Procedure(s) Performed: COLONOSCOPY WITH PROPOFOL (N/A )     Patient location during evaluation: PACU Anesthesia Type: MAC Level of consciousness: awake and alert Pain management: pain level controlled Vital Signs Assessment: post-procedure vital signs reviewed and stable Respiratory status: spontaneous breathing, nonlabored ventilation, respiratory function stable and patient connected to nasal cannula oxygen Cardiovascular status: stable and blood pressure returned to baseline Postop Assessment: no apparent nausea or vomiting Anesthetic complications: no    Last Vitals:  Vitals:   03/08/17 1130 03/08/17 1442  BP: 132/85 138/74  Pulse: 93 91  Resp: 18 17  Temp: 36.7 C   SpO2: 98% 100%    Last Pain:  Vitals:   03/08/17 1130  TempSrc: Oral                 Evan Mackie DAVID

## 2017-03-08 NOTE — Anesthesia Preprocedure Evaluation (Addendum)
Anesthesia Evaluation  Patient identified by MRN, date of birth, ID band Patient awake    Reviewed: Allergy & Precautions, NPO status , Patient's Chart, lab work & pertinent test results  Airway Mallampati: I  TM Distance: >3 FB Neck ROM: Full    Dental   Pulmonary    Pulmonary exam normal        Cardiovascular hypertension, Pt. on medications Normal cardiovascular exam     Neuro/Psych Seizures -,  Anxiety Depression Bipolar Disorder Schizophrenia    GI/Hepatic   Endo/Other  diabetes, Type 2, Insulin Dependent  Renal/GU      Musculoskeletal   Abdominal   Peds  Hematology   Anesthesia Other Findings   Reproductive/Obstetrics                            Anesthesia Physical Anesthesia Plan  ASA: III  Anesthesia Plan: MAC   Post-op Pain Management:    Induction: Intravenous  PONV Risk Score and Plan: 2  Airway Management Planned: Simple Face Mask  Additional Equipment:   Intra-op Plan:   Post-operative Plan:   Informed Consent: I have reviewed the patients History and Physical, chart, labs and discussed the procedure including the risks, benefits and alternatives for the proposed anesthesia with the patient or authorized representative who has indicated his/her understanding and acceptance.     Plan Discussed with: CRNA and Surgeon  Anesthesia Plan Comments:         Anesthesia Quick Evaluation

## 2017-03-08 NOTE — Transfer of Care (Signed)
Immediate Anesthesia Transfer of Care Note  Patient: Tina Saunders  Procedure(s) Performed: COLONOSCOPY WITH PROPOFOL (N/A )  Patient Location: Endoscopy Unit  Anesthesia Type:MAC  Level of Consciousness: drowsy and patient cooperative  Airway & Oxygen Therapy: Patient Spontanous Breathing and Patient connected to face mask oxygen  Post-op Assessment: Report given to RN and Post -op Vital signs reviewed and stable  Post vital signs: Reviewed and stable  Last Vitals:  Vitals:   03/08/17 1130  BP: 132/85  Pulse: 93  Resp: 18  Temp: 36.7 C  SpO2: 98%    Last Pain:  Vitals:   03/08/17 1130  TempSrc: Oral         Complications: No apparent anesthesia complications

## 2017-03-08 NOTE — Interval H&P Note (Signed)
History and Physical Interval Note:  03/08/2017 1:48 PM  Tina Saunders  has presented today for surgery, with the diagnosis of abd pain, chronic constipation  The various methods of treatment have been discussed with the patient and family. After consideration of risks, benefits and other options for treatment, the patient has consented to  Procedure(s): COLONOSCOPY WITH PROPOFOL (N/A) as a surgical intervention .  The patient's history has been reviewed, patient examined, no change in status, stable for surgery.  I have reviewed the patient's chart and labs.  Questions were answered to the patient's satisfaction.     Milus Banister

## 2017-03-08 NOTE — Op Note (Signed)
[facility] Patient Name: Tina Saunders Procedure Date: 03/08/2017 MRN: [id number] Attending MD: Milus Banister , MD Date of Birth: July 14, 1985 CSN: [id number] Age: 32 Admit Type: Outpatient Procedure:                Colonoscopy Indications:              Generalized abdominal pain, alternating                            constipation and diarrhea (can go as long as 2                            weeks without BM) Providers:                Milus Banister, MD, Cleda Daub, RN, Elspeth Cho Tech., Technician, Charolette Child,                            Technician, Adair Laundry, CRNA Referring MD:              Medicines:                Monitored Anesthesia Care Complications:            No immediate complications. Estimated blood loss:                            None. Estimated Blood Loss:     Estimated blood loss: none. Procedure:                Pre-Anesthesia Assessment:                           - Prior to the procedure, a History and Physical                            was performed, and patient medications and                            allergies were reviewed. The patient's tolerance of                            previous anesthesia was also reviewed. The risks                            and benefits of the procedure and the sedation                            options and risks were discussed with the patient.                            All questions were answered, and informed consent                            was obtained. Prior Anticoagulants: The  patient has                            taken no previous anticoagulant or antiplatelet                            agents. ASA Grade Assessment: III - A patient with                            severe systemic disease. After reviewing the risks                            and benefits, the patient was deemed in                            satisfactory condition to undergo the procedure.                   After obtaining informed consent, the colonoscope                            was passed under direct vision. Throughout the                            procedure, the patient's blood pressure, pulse, and                            oxygen saturations were monitored continuously. The                            EC-[id number] ([id number]) scope was introduced through                            the anus and advanced to the the cecum, identified                            by appendiceal orifice and ileocecal valve. The                            colonoscopy was performed without difficulty. The                            patient tolerated the procedure well. The quality                            of the bowel preparation was good. The ileocecal                            valve, appendiceal orifice, and rectum were                            photographed. Scope In: 2:23:20 PM Scope Out: 2:34:36 PM Scope Withdrawal Time: 0 hours 6 minutes 50 seconds  Total Procedure Duration: 0 hours 11 minutes 16 seconds  Findings:      The entire examined  colon appeared normal on direct and retroflexion       views. Impression:               - The entire examined colon is normal on direct and                            retroflexion views.                           - No polyps, cancers or signs of infammation. Moderate Sedation:      N/A- Per Anesthesia Care Recommendation:           - Patient has a contact number available for                            emergencies. The signs and symptoms of potential                            delayed complications were discussed with the                            patient. Return to normal activities tomorrow.                            Written discharge instructions were provided to the                            patient.                           - Resume previous diet.                           - Continue present medications.                           -  Consider daily fiber supplement for your                            alternating constipation/diarrhea.                           - No further GI testing recommended. Procedure Code(s):        --- Professional ---                           [phone], Colonoscopy, flexible; diagnostic, including                            collection of specimen(s) by brushing or washing,                            when performed (separate procedure) Diagnosis Code(s):        --- Professional ---                           R10.84, Generalized abdominal pain CPT  copyright 2016 American Medical Association. All rights reserved. The codes documented in this report are preliminary and upon coder review may  be revised to meet current compliance requirements. Milus Banister, MD 03/08/2017 2:37:40 PM This report has been signed electronically. Number of Addenda: 0

## 2017-03-08 NOTE — Discharge Instructions (Signed)

## 2017-03-09 ENCOUNTER — Encounter (HOSPITAL_COMMUNITY): Payer: Self-pay | Admitting: Gastroenterology

## 2017-03-13 ENCOUNTER — Emergency Department (HOSPITAL_COMMUNITY): Payer: Medicare HMO

## 2017-03-13 ENCOUNTER — Inpatient Hospital Stay (HOSPITAL_COMMUNITY)
Admission: EM | Admit: 2017-03-13 | Discharge: 2017-03-16 | DRG: 638 | Disposition: A | Discharge status: Home / Self Care | Payer: Medicare HMO | Attending: Internal Medicine | Admitting: Internal Medicine

## 2017-03-13 ENCOUNTER — Encounter (HOSPITAL_COMMUNITY): Payer: Self-pay | Admitting: Emergency Medicine

## 2017-03-13 ENCOUNTER — Other Ambulatory Visit: Payer: Self-pay | Admitting: Pharmacist

## 2017-03-13 DIAGNOSIS — Z6841 Body Mass Index (BMI) 40.0 and over, adult: Secondary | ICD-10-CM | POA: Diagnosis not present

## 2017-03-13 DIAGNOSIS — Z888 Allergy status to other drugs, medicaments and biological substances status: Secondary | ICD-10-CM

## 2017-03-13 DIAGNOSIS — E669 Obesity, unspecified: Secondary | ICD-10-CM | POA: Diagnosis not present

## 2017-03-13 DIAGNOSIS — Z794 Long term (current) use of insulin: Secondary | ICD-10-CM

## 2017-03-13 DIAGNOSIS — F251 Schizoaffective disorder, depressive type: Secondary | ICD-10-CM | POA: Diagnosis present

## 2017-03-13 DIAGNOSIS — B373 Candidiasis of vulva and vagina: Secondary | ICD-10-CM | POA: Diagnosis present

## 2017-03-13 DIAGNOSIS — Z9049 Acquired absence of other specified parts of digestive tract: Secondary | ICD-10-CM

## 2017-03-13 DIAGNOSIS — E1310 Other specified diabetes mellitus with ketoacidosis without coma: Secondary | ICD-10-CM | POA: Diagnosis not present

## 2017-03-13 DIAGNOSIS — Z91013 Allergy to seafood: Secondary | ICD-10-CM | POA: Diagnosis not present

## 2017-03-13 DIAGNOSIS — R000 Tachycardia, unspecified: Secondary | ICD-10-CM | POA: Diagnosis present

## 2017-03-13 DIAGNOSIS — E1110 Type 2 diabetes mellitus with ketoacidosis without coma: Principal | ICD-10-CM | POA: Diagnosis present

## 2017-03-13 DIAGNOSIS — B951 Streptococcus, group B, as the cause of diseases classified elsewhere: Secondary | ICD-10-CM | POA: Diagnosis present

## 2017-03-13 DIAGNOSIS — F250 Schizoaffective disorder, bipolar type: Secondary | ICD-10-CM | POA: Diagnosis not present

## 2017-03-13 DIAGNOSIS — R569 Unspecified convulsions: Secondary | ICD-10-CM | POA: Diagnosis not present

## 2017-03-13 DIAGNOSIS — E0810 Diabetes mellitus due to underlying condition with ketoacidosis without coma: Secondary | ICD-10-CM | POA: Diagnosis not present

## 2017-03-13 DIAGNOSIS — N390 Urinary tract infection, site not specified: Secondary | ICD-10-CM | POA: Diagnosis present

## 2017-03-13 DIAGNOSIS — G40909 Epilepsy, unspecified, not intractable, without status epilepticus: Secondary | ICD-10-CM | POA: Diagnosis present

## 2017-03-13 DIAGNOSIS — R162 Hepatomegaly with splenomegaly, not elsewhere classified: Secondary | ICD-10-CM | POA: Diagnosis not present

## 2017-03-13 DIAGNOSIS — R109 Unspecified abdominal pain: Secondary | ICD-10-CM | POA: Diagnosis not present

## 2017-03-13 DIAGNOSIS — R945 Abnormal results of liver function studies: Secondary | ICD-10-CM | POA: Diagnosis not present

## 2017-03-13 DIAGNOSIS — H5461 Unqualified visual loss, right eye, normal vision left eye: Secondary | ICD-10-CM | POA: Diagnosis present

## 2017-03-13 DIAGNOSIS — K760 Fatty (change of) liver, not elsewhere classified: Secondary | ICD-10-CM | POA: Diagnosis not present

## 2017-03-13 DIAGNOSIS — Z8249 Family history of ischemic heart disease and other diseases of the circulatory system: Secondary | ICD-10-CM

## 2017-03-13 DIAGNOSIS — Z885 Allergy status to narcotic agent status: Secondary | ICD-10-CM | POA: Diagnosis not present

## 2017-03-13 DIAGNOSIS — I10 Essential (primary) hypertension: Secondary | ICD-10-CM | POA: Diagnosis present

## 2017-03-13 DIAGNOSIS — E119 Type 2 diabetes mellitus without complications: Secondary | ICD-10-CM | POA: Diagnosis not present

## 2017-03-13 DIAGNOSIS — Z833 Family history of diabetes mellitus: Secondary | ICD-10-CM | POA: Diagnosis not present

## 2017-03-13 DIAGNOSIS — K5909 Other constipation: Secondary | ICD-10-CM | POA: Diagnosis present

## 2017-03-13 LAB — URINALYSIS, ROUTINE W REFLEX MICROSCOPIC
Bilirubin Urine: NEGATIVE
Glucose, UA: >=500 mg/dL — AB
Ketones, ur: NEGATIVE mg/dL
Nitrite: NEGATIVE
Protein, ur: NEGATIVE mg/dL
Specific Gravity, Urine: 1.031 — ABNORMAL HIGH (ref 1.005–1.030)
pH: 6 (ref 5.0–8.0)

## 2017-03-13 LAB — I-STAT BETA HCG BLOOD, ED (MC, WL, AP ONLY): I-stat hCG, quantitative: <5 m[IU]/mL (ref <5)

## 2017-03-13 LAB — COMPREHENSIVE METABOLIC PANEL
ALT: 24 U/L (ref 14–54)
AST: 48 U/L — ABNORMAL HIGH (ref 15–41)
Albumin: 4.1 g/dL (ref 3.5–5.0)
Alkaline Phosphatase: 122 U/L (ref 38–126)
Anion gap: 17 — ABNORMAL HIGH (ref 5–15)
BUN: <5 mg/dL — ABNORMAL LOW (ref 6–20)
CO2: 17 mmol/L — ABNORMAL LOW (ref 22–32)
Calcium: 8.9 mg/dL (ref 8.9–10.3)
Chloride: 94 mmol/L — ABNORMAL LOW (ref 101–111)
Creatinine, Ser: 0.71 mg/dL (ref 0.44–1.00)
GFR calc Af Amer: >60 mL/min (ref >60)
GFR calc non Af Amer: >60 mL/min (ref >60)
Glucose, Bld: 607 mg/dL (ref 65–99)
Potassium: 3.7 mmol/L (ref 3.5–5.1)
Sodium: 128 mmol/L — ABNORMAL LOW (ref 135–145)
Total Bilirubin: 0.8 mg/dL (ref 0.3–1.2)
Total Protein: 8.1 g/dL (ref 6.5–8.1)

## 2017-03-13 LAB — CBC
HCT: 35.8 % — ABNORMAL LOW (ref 36.0–46.0)
Hemoglobin: 11.9 g/dL — ABNORMAL LOW (ref 12.0–15.0)
MCH: 26.2 pg (ref 26.0–34.0)
MCHC: 33.2 g/dL (ref 30.0–36.0)
MCV: 78.9 fL (ref 78.0–100.0)
Platelets: 122 10*3/uL — ABNORMAL LOW (ref 150–400)
RBC: 4.54 MIL/uL (ref 3.87–5.11)
RDW: 15.9 % — ABNORMAL HIGH (ref 11.5–15.5)
WBC: 4.3 10*3/uL (ref 4.0–10.5)

## 2017-03-13 LAB — CBG MONITORING, ED
Glucose-Capillary: >600 mg/dL (ref 65–99)
Glucose-Capillary: >600 mg/dL (ref 65–99)
Glucose-Capillary: 542 mg/dL (ref 65–99)

## 2017-03-13 LAB — LIPASE, BLOOD: Lipase: 32 U/L (ref 11–51)

## 2017-03-13 MED ORDER — DEXTROSE 50 % IV SOLN: 25.0000 mL | INTRAVENOUS | Status: DC | PRN

## 2017-03-13 MED ORDER — SODIUM CHLORIDE 0.9 % IV SOLN
INTRAVENOUS | Status: DC
  Administered 2017-03-13: 5.4 [IU]/h via INTRAVENOUS
  Filled 2017-03-13 (×3): qty 1

## 2017-03-13 MED ORDER — SODIUM CHLORIDE 0.9 % IJ SOLN
INTRAMUSCULAR | Status: AC
  Filled 2017-03-13: qty 50

## 2017-03-13 MED ORDER — POTASSIUM CHLORIDE 10 MEQ/100ML IV SOLN
10.0000 meq | INTRAVENOUS | Status: AC
  Administered 2017-03-13 – 2017-03-14 (×2): 10 meq via INTRAVENOUS
  Filled 2017-03-13 (×2): qty 100

## 2017-03-13 MED ORDER — SODIUM CHLORIDE 0.9 % IV SOLN
INTRAVENOUS | Status: DC
  Administered 2017-03-13: 22:00:00 via INTRAVENOUS

## 2017-03-13 MED ORDER — SODIUM CHLORIDE 0.9 % IV BOLUS (SEPSIS)
1000.0000 mL | Freq: Once | INTRAVENOUS | Status: AC
  Administered 2017-03-13: 1000 mL via INTRAVENOUS

## 2017-03-13 MED ORDER — FENTANYL CITRATE (PF) 100 MCG/2ML IJ SOLN
50.0000 ug | Freq: Once | INTRAMUSCULAR | Status: AC
  Administered 2017-03-13: 50 ug via INTRAVENOUS
  Filled 2017-03-13: qty 2

## 2017-03-13 MED ORDER — DEXTROSE-NACL 5-0.45 % IV SOLN
INTRAVENOUS | Status: DC
  Administered 2017-03-14 (×2): via INTRAVENOUS

## 2017-03-13 MED ORDER — POTASSIUM CHLORIDE IN NACL 20-0.9 MEQ/L-% IV SOLN
INTRAVENOUS | Status: AC
Start: 2017-03-14 — End: 2017-03-14
  Administered 2017-03-14 (×2): via INTRAVENOUS
  Filled 2017-03-13 (×2): qty 1000

## 2017-03-13 MED ORDER — IOPAMIDOL (ISOVUE-300) INJECTION 61%
INTRAVENOUS | Status: AC
  Administered 2017-03-13: 100 mL via INTRAVENOUS
  Filled 2017-03-13: qty 100

## 2017-03-13 MED ORDER — ONDANSETRON HCL 4 MG/2ML IJ SOLN
4.0000 mg | Freq: Once | INTRAMUSCULAR | Status: AC
  Administered 2017-03-13: 4 mg via INTRAVENOUS
  Filled 2017-03-13: qty 2

## 2017-03-13 NOTE — ED Notes (Signed)
Bed: TD42 Expected date:  Expected time:  Means of arrival:  Comments: Luciana Axe

## 2017-03-13 NOTE — Patient Outreach (Signed)
[place] [facility]) Care Management  03/13/2017  SERINITY WARE [birth year] [id number]   Patient was called regarding post discharge medication reconciliation.  HIPAA identifiers were obtained.  Patient is a 32 year old female with multiple medical conditions including but not limited to:  seizure disorder, type 2 diabetes, hypertension bipolar disorder, anxiety, obesity, polycystic ovarian syndrome, and schizophrenia. Patient has had multiple hospitalizations and ED visits for mental health issues.  When asked to review her medications, the patient said she does not know what her medications are as they are packaged from the ACT team.    Patient was offered a home visit and she refused pharmacy services.   [facility] has already closed the patient.  Plan: Close patient's case. Send letters to the patient and her providers.  Elayne Guerin, PharmD, [place] Clinical Pharmacist [phone]

## 2017-03-13 NOTE — ED Notes (Signed)
Patient transported to CT 

## 2017-03-13 NOTE — ED Notes (Signed)
Patient seen in lobby eating chips and drinking soda.

## 2017-03-13 NOTE — ED Triage Notes (Signed)
Patient c/o RLQ pain with N/V/D since this morning. Patient also states "I think my blood sugar is high because I feel weird." States she has been out of strips an unable to check sugar for three days. Ambulatory.

## 2017-03-14 DIAGNOSIS — R162 Hepatomegaly with splenomegaly, not elsewhere classified: Secondary | ICD-10-CM | POA: Diagnosis present

## 2017-03-14 DIAGNOSIS — B951 Streptococcus, group B, as the cause of diseases classified elsewhere: Secondary | ICD-10-CM | POA: Diagnosis present

## 2017-03-14 DIAGNOSIS — R000 Tachycardia, unspecified: Secondary | ICD-10-CM | POA: Diagnosis present

## 2017-03-14 DIAGNOSIS — F250 Schizoaffective disorder, bipolar type: Secondary | ICD-10-CM | POA: Diagnosis present

## 2017-03-14 DIAGNOSIS — Z888 Allergy status to other drugs, medicaments and biological substances status: Secondary | ICD-10-CM | POA: Diagnosis not present

## 2017-03-14 DIAGNOSIS — K5909 Other constipation: Secondary | ICD-10-CM | POA: Diagnosis present

## 2017-03-14 DIAGNOSIS — R569 Unspecified convulsions: Secondary | ICD-10-CM

## 2017-03-14 DIAGNOSIS — Z885 Allergy status to narcotic agent status: Secondary | ICD-10-CM | POA: Diagnosis not present

## 2017-03-14 DIAGNOSIS — E1310 Other specified diabetes mellitus with ketoacidosis without coma: Secondary | ICD-10-CM | POA: Diagnosis not present

## 2017-03-14 DIAGNOSIS — Z8249 Family history of ischemic heart disease and other diseases of the circulatory system: Secondary | ICD-10-CM | POA: Diagnosis not present

## 2017-03-14 DIAGNOSIS — I10 Essential (primary) hypertension: Secondary | ICD-10-CM | POA: Diagnosis present

## 2017-03-14 DIAGNOSIS — E1110 Type 2 diabetes mellitus with ketoacidosis without coma: Secondary | ICD-10-CM | POA: Diagnosis present

## 2017-03-14 DIAGNOSIS — K760 Fatty (change of) liver, not elsewhere classified: Secondary | ICD-10-CM | POA: Diagnosis present

## 2017-03-14 DIAGNOSIS — E119 Type 2 diabetes mellitus without complications: Secondary | ICD-10-CM | POA: Diagnosis not present

## 2017-03-14 DIAGNOSIS — N390 Urinary tract infection, site not specified: Secondary | ICD-10-CM | POA: Diagnosis present

## 2017-03-14 DIAGNOSIS — Z6841 Body Mass Index (BMI) 40.0 and over, adult: Secondary | ICD-10-CM | POA: Diagnosis not present

## 2017-03-14 DIAGNOSIS — E669 Obesity, unspecified: Secondary | ICD-10-CM | POA: Diagnosis not present

## 2017-03-14 DIAGNOSIS — E0810 Diabetes mellitus due to underlying condition with ketoacidosis without coma: Secondary | ICD-10-CM | POA: Diagnosis not present

## 2017-03-14 DIAGNOSIS — R945 Abnormal results of liver function studies: Secondary | ICD-10-CM | POA: Diagnosis not present

## 2017-03-14 DIAGNOSIS — Z9049 Acquired absence of other specified parts of digestive tract: Secondary | ICD-10-CM | POA: Diagnosis not present

## 2017-03-14 DIAGNOSIS — B373 Candidiasis of vulva and vagina: Secondary | ICD-10-CM | POA: Diagnosis present

## 2017-03-14 DIAGNOSIS — Z833 Family history of diabetes mellitus: Secondary | ICD-10-CM | POA: Diagnosis not present

## 2017-03-14 DIAGNOSIS — Z794 Long term (current) use of insulin: Secondary | ICD-10-CM | POA: Diagnosis not present

## 2017-03-14 DIAGNOSIS — Z91013 Allergy to seafood: Secondary | ICD-10-CM | POA: Diagnosis not present

## 2017-03-14 DIAGNOSIS — G40909 Epilepsy, unspecified, not intractable, without status epilepticus: Secondary | ICD-10-CM | POA: Diagnosis present

## 2017-03-14 LAB — CBG MONITORING, ED
GLUCOSE-CAPILLARY: 247 mg/dL — AB (ref 65–99)
GLUCOSE-CAPILLARY: 262 mg/dL — AB (ref 65–99)
GLUCOSE-CAPILLARY: 263 mg/dL — AB (ref 65–99)
GLUCOSE-CAPILLARY: 310 mg/dL — AB (ref 65–99)
GLUCOSE-CAPILLARY: 327 mg/dL — AB (ref 65–99)
GLUCOSE-CAPILLARY: 422 mg/dL — AB (ref 65–99)
Glucose-Capillary: 374 mg/dL — ABNORMAL HIGH (ref 65–99)
Glucose-Capillary: 332 mg/dL — ABNORMAL HIGH (ref 65–99)
Glucose-Capillary: 291 mg/dL — ABNORMAL HIGH (ref 65–99)
Glucose-Capillary: 246 mg/dL — ABNORMAL HIGH (ref 65–99)
Glucose-Capillary: 293 mg/dL — ABNORMAL HIGH (ref 65–99)
Glucose-Capillary: 299 mg/dL — ABNORMAL HIGH (ref 65–99)

## 2017-03-14 LAB — BASIC METABOLIC PANEL
ANION GAP: 8 (ref 5–15)
Anion gap: 12 (ref 5–15)
BUN: <5 mg/dL — ABNORMAL LOW (ref 6–20)
BUN: <5 mg/dL — ABNORMAL LOW (ref 6–20)
CALCIUM: 8.4 mg/dL — AB (ref 8.9–10.3)
CALCIUM: 8 mg/dL — AB (ref 8.9–10.3)
CO2: 22 mmol/L (ref 22–32)
CO2: 21 mmol/L — AB (ref 22–32)
CREATININE: 0.46 mg/dL (ref 0.44–1.00)
Chloride: 100 mmol/L — ABNORMAL LOW (ref 101–111)
Chloride: 104 mmol/L (ref 101–111)
Creatinine, Ser: 0.45 mg/dL (ref 0.44–1.00)
GFR calc Af Amer: >60 mL/min (ref >60)
GFR calc non Af Amer: >60 mL/min (ref >60)
GFR calc non Af Amer: >60 mL/min (ref >60)
GLUCOSE: 248 mg/dL — AB (ref 65–99)
GLUCOSE: 353 mg/dL — AB (ref 65–99)
POTASSIUM: 4.1 mmol/L (ref 3.5–5.1)
Potassium: 3.5 mmol/L (ref 3.5–5.1)
Sodium: 134 mmol/L — ABNORMAL LOW (ref 135–145)
Sodium: 133 mmol/L — ABNORMAL LOW (ref 135–145)

## 2017-03-14 LAB — GLUCOSE, CAPILLARY
GLUCOSE-CAPILLARY: 234 mg/dL — AB (ref 65–99)
GLUCOSE-CAPILLARY: 183 mg/dL — AB (ref 65–99)
Glucose-Capillary: 259 mg/dL — ABNORMAL HIGH (ref 65–99)
Glucose-Capillary: 309 mg/dL — ABNORMAL HIGH (ref 65–99)
Glucose-Capillary: 259 mg/dL — ABNORMAL HIGH (ref 65–99)

## 2017-03-14 LAB — MRSA PCR SCREENING: MRSA BY PCR: POSITIVE — AB

## 2017-03-14 MED ORDER — QUETIAPINE FUMARATE 100 MG PO TABS
500.0000 mg | ORAL_TABLET | Freq: Every day | ORAL | Status: DC
  Administered 2017-03-14 – 2017-03-15 (×2): 500 mg via ORAL
  Filled 2017-03-14 (×2): qty 5

## 2017-03-14 MED ORDER — FLUCONAZOLE 150 MG PO TABS
150.0000 mg | ORAL_TABLET | Freq: Once | ORAL | Status: AC
  Administered 2017-03-14: 150 mg via ORAL
  Filled 2017-03-14: qty 1

## 2017-03-14 MED ORDER — LISINOPRIL 10 MG PO TABS
10.0000 mg | ORAL_TABLET | Freq: Every day | ORAL | Status: DC
  Administered 2017-03-14 – 2017-03-16 (×3): 10 mg via ORAL
  Filled 2017-03-14 (×3): qty 1

## 2017-03-14 MED ORDER — INSULIN ASPART 100 UNIT/ML ~~LOC~~ SOLN
6.0000 [IU] | Freq: Three times a day (TID) | SUBCUTANEOUS | Status: DC
  Administered 2017-03-15 (×3): 6 [IU] via SUBCUTANEOUS

## 2017-03-14 MED ORDER — BUSPIRONE HCL 5 MG PO TABS
15.0000 mg | ORAL_TABLET | Freq: Three times a day (TID) | ORAL | Status: DC
  Administered 2017-03-14 – 2017-03-16 (×7): 15 mg via ORAL
  Filled 2017-03-14: qty 1
  Filled 2017-03-14 (×2): qty 3
  Filled 2017-03-14: qty 2
  Filled 2017-03-14: qty 1
  Filled 2017-03-14: qty 3
  Filled 2017-03-14: qty 1

## 2017-03-14 MED ORDER — INSULIN ASPART 100 UNIT/ML ~~LOC~~ SOLN
0.0000 [IU] | Freq: Three times a day (TID) | SUBCUTANEOUS | Status: DC
  Administered 2017-03-15: 7 [IU] via SUBCUTANEOUS
  Administered 2017-03-15: 11 [IU] via SUBCUTANEOUS
  Administered 2017-03-15: 15 [IU] via SUBCUTANEOUS
  Administered 2017-03-16: 20 [IU] via SUBCUTANEOUS
  Administered 2017-03-16: 15 [IU] via SUBCUTANEOUS

## 2017-03-14 MED ORDER — MUPIROCIN 2 % EX OINT
1.0000 "application " | TOPICAL_OINTMENT | Freq: Two times a day (BID) | CUTANEOUS | Status: DC
  Administered 2017-03-14 – 2017-03-16 (×4): 1 via NASAL
  Filled 2017-03-14 (×2): qty 22

## 2017-03-14 MED ORDER — INSULIN DETEMIR 100 UNIT/ML ~~LOC~~ SOLN
50.0000 [IU] | Freq: Once | SUBCUTANEOUS | Status: AC
  Administered 2017-03-14: 50 [IU] via SUBCUTANEOUS
  Filled 2017-03-14: qty 0.5

## 2017-03-14 MED ORDER — FENTANYL CITRATE (PF) 100 MCG/2ML IJ SOLN
25.0000 ug | Freq: Once | INTRAMUSCULAR | Status: AC
  Administered 2017-03-14: 25 ug via INTRAVENOUS
  Filled 2017-03-14: qty 2

## 2017-03-14 MED ORDER — INSULIN ASPART 100 UNIT/ML ~~LOC~~ SOLN
0.0000 [IU] | Freq: Every day | SUBCUTANEOUS | Status: DC
  Administered 2017-03-15: 4 [IU] via SUBCUTANEOUS

## 2017-03-14 MED ORDER — QUETIAPINE FUMARATE 25 MG PO TABS
50.0000 mg | ORAL_TABLET | Freq: Every day | ORAL | Status: DC
  Administered 2017-03-14 – 2017-03-16 (×3): 50 mg via ORAL
  Filled 2017-03-14 (×2): qty 1
  Filled 2017-03-14: qty 2

## 2017-03-14 MED ORDER — DICYCLOMINE HCL 20 MG PO TABS
20.0000 mg | ORAL_TABLET | Freq: Three times a day (TID) | ORAL | Status: DC | PRN
  Filled 2017-03-14: qty 1

## 2017-03-14 MED ORDER — METOCLOPRAMIDE HCL 5 MG/ML IJ SOLN
5.0000 mg | Freq: Three times a day (TID) | INTRAMUSCULAR | Status: DC
  Administered 2017-03-14 – 2017-03-16 (×7): 5 mg via INTRAVENOUS
  Filled 2017-03-14 (×7): qty 2

## 2017-03-14 MED ORDER — LEVETIRACETAM 500 MG PO TABS
500.0000 mg | ORAL_TABLET | Freq: Two times a day (BID) | ORAL | Status: DC
  Administered 2017-03-14 – 2017-03-16 (×5): 500 mg via ORAL
  Filled 2017-03-14 (×5): qty 1

## 2017-03-14 MED ORDER — ONDANSETRON HCL 4 MG/2ML IJ SOLN: 4.0000 mg | Freq: Four times a day (QID) | INTRAMUSCULAR | Status: DC | PRN

## 2017-03-14 MED ORDER — INSULIN DETEMIR 100 UNIT/ML ~~LOC~~ SOLN
50.0000 [IU] | Freq: Two times a day (BID) | SUBCUTANEOUS | Status: DC
  Administered 2017-03-15 – 2017-03-16 (×3): 50 [IU] via SUBCUTANEOUS
  Filled 2017-03-14 (×4): qty 0.5

## 2017-03-14 MED ORDER — SODIUM CHLORIDE 0.9 % IV SOLN
1.0000 g | Freq: Every day | INTRAVENOUS | Status: DC
  Administered 2017-03-14: 1 g via INTRAVENOUS
  Filled 2017-03-14 (×2): qty 10

## 2017-03-14 MED ORDER — CLONIDINE HCL 0.1 MG PO TABS
0.1000 mg | ORAL_TABLET | Freq: Two times a day (BID) | ORAL | Status: DC
  Administered 2017-03-14 – 2017-03-16 (×5): 0.1 mg via ORAL
  Filled 2017-03-14 (×5): qty 1

## 2017-03-14 MED ORDER — TOPIRAMATE 100 MG PO TABS
100.0000 mg | ORAL_TABLET | Freq: Two times a day (BID) | ORAL | Status: DC
  Administered 2017-03-14 – 2017-03-16 (×5): 100 mg via ORAL
  Filled 2017-03-14 (×5): qty 1

## 2017-03-14 MED ORDER — SODIUM CHLORIDE 0.9 % IV SOLN
INTRAVENOUS | Status: AC
  Filled 2017-03-14: qty 1

## 2017-03-14 MED ORDER — SODIUM CHLORIDE 0.9 % IV SOLN
INTRAVENOUS | Status: AC
  Administered 2017-03-13 – 2017-03-15 (×3): via INTRAVENOUS

## 2017-03-14 MED ORDER — CHLORPROMAZINE HCL 50 MG PO TABS
50.0000 mg | ORAL_TABLET | Freq: Three times a day (TID) | ORAL | Status: DC
  Administered 2017-03-14 – 2017-03-16 (×7): 50 mg via ORAL
  Filled 2017-03-14 (×3): qty 1
  Filled 2017-03-14: qty 2
  Filled 2017-03-14 (×5): qty 1

## 2017-03-14 MED ORDER — CHLORHEXIDINE GLUCONATE CLOTH 2 % EX PADS
6.0000 | MEDICATED_PAD | Freq: Every day | CUTANEOUS | Status: DC
  Administered 2017-03-15 – 2017-03-16 (×2): 6 via TOPICAL

## 2017-03-14 MED ORDER — ONDANSETRON HCL 4 MG PO TABS: 4.0000 mg | ORAL_TABLET | Freq: Four times a day (QID) | ORAL | Status: DC | PRN

## 2017-03-14 MED ORDER — SERTRALINE HCL 100 MG PO TABS
200.0000 mg | ORAL_TABLET | Freq: Every day | ORAL | Status: DC
  Administered 2017-03-14 – 2017-03-16 (×3): 200 mg via ORAL
  Filled 2017-03-14: qty 4
  Filled 2017-03-14 (×2): qty 2

## 2017-03-14 MED ORDER — PANTOPRAZOLE SODIUM 40 MG PO TBEC
40.0000 mg | DELAYED_RELEASE_TABLET | Freq: Every day | ORAL | Status: DC
  Administered 2017-03-14 – 2017-03-16 (×3): 40 mg via ORAL
  Filled 2017-03-14 (×3): qty 1

## 2017-03-14 MED ORDER — ENOXAPARIN SODIUM 40 MG/0.4ML ~~LOC~~ SOLN
40.0000 mg | SUBCUTANEOUS | Status: DC
  Administered 2017-03-14: 40 mg via SUBCUTANEOUS
  Filled 2017-03-14 (×2): qty 0.4

## 2017-03-14 NOTE — ED Notes (Signed)
Insulin rate changed to 22.5units/hr.

## 2017-03-14 NOTE — ED Provider Notes (Signed)
[facility]-ICU/STEPDOWN Provider Note   CSN: [id number] Arrival date & time: 03/13/17  1844     History   Chief Complaint Chief Complaint  Patient presents with  . Abdominal Pain  . Emesis  . Hyperglycemia    HPI Tina Saunders is a 32 y.o. female.  HPI   32 year old female with abdominal pain and nausea/vomiting.  Symptom onset this morning.  Persistent since then.  Pain is in the lower abdomen to the right lower quadrant.  She states that her blood sugars been running high.  She is been out of her testing strips for several days.  She still says she is taking her insulin as prescribed.  No fevers or chills.  Polyuria.  No dysuria.  Past Medical History:  Diagnosis Date  . Anxiety   . Bipolar 1 disorder ([place])   . Cancer of abdominal wall   . Depression   . Diabetes mellitus without complication ([place])   . Hypertension   . Obesity   . Obesity   . Polycystic ovarian syndrome 07/01/2011   Patient report  . 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 Schizophrenia [facility])     Patient Active Problem List   Diagnosis Date Noted  . DKA (diabetic ketoacidoses) ([place]) 03/14/2017  . Chronic constipation   . Seizures ([place]) 12/11/2016  . Acute lower UTI 12/11/2016  . DM2 (diabetes mellitus, type 2) ([place]) 12/11/2016  . Uncontrolled diabetes mellitus ([place]) 11/03/2016  . Schizoaffective disorder, bipolar type ([place]) 11/02/2016  . Cluster B personality disorder ([place]) 11/02/2016  . Schizoaffective disorder, mixed type ([place]) 05/30/2014  . Suicidal ideation   . 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 Headache 04/15/2013  . HTN (hypertension) 04/15/2013  . Post traumatic stress disorder 12/07/2011  . CAP (community acquired pneumonia) 08/27/2011  . Chest pain 08/26/2011  . SOB (shortness of breath) 08/26/2011  . Fever 08/26/2011  . Hypokalemia 08/26/2011  . PSVT (paroxysmal supraventricular tachycardia) ([place]) 08/26/2011  . ADHD 09/23/2007  . EPIGASTRIC PAIN 09/23/2007  .  Obesity, unspecified 07/30/2007  . Depression 07/30/2007  . SLEEP DISORDER 07/30/2007  . IMPAIRED FASTING GLUCOSE 07/30/2007  . FATIGUE 11/21/2006  . ABNORMAL FINDINGS, ELEVATED BP W/O HTN 11/21/2006  . METRORRHAGIA 06/13/2006  . DISORDER, MENSTRUAL NEC 06/13/2006  . DIZZINESS 06/13/2006  . POLYCYSTIC OVARIAN DISEASE 04/25/2006  . AMENORRHEA, SECONDARY 04/20/2006  . ACNE, MILD 04/20/2006  . Abdominal pain 04/20/2006    Past Surgical History:  Procedure Laterality Date  . CHOLECYSTECTOMY    . COLONOSCOPY WITH PROPOFOL N/A 03/08/2017   Procedure: COLONOSCOPY WITH PROPOFOL;  Surgeon: Milus Banister, MD;  Location: [facility] ENDOSCOPY;  Service: Endoscopy;  Laterality: N/A;  . HERNIA REPAIR    . Ovarian Cyst Excision    . VARICOSE VEIN SURGERY      OB History    Gravida Para Term Preterm AB Living   0             SAB TAB Ectopic Multiple Live Births                   Home Medications    Prior to Admission medications   Medication Sig Start Date End Date Taking? Authorizing Provider  busPIRone (BUSPAR) 15 MG tablet Take 15 mg by mouth 3 (three) times daily.   Yes [provider]  chlorproMAZINE (THORAZINE) 50 MG tablet Take 1 tablet (50 mg total) by mouth 3 (three) times daily. For agitation/mood control 02/09/17  Yes Encarnacion Slates,  NP  cloNIDine (CATAPRES) 0.1 MG tablet Take 1 tablet (0.1 mg total) by mouth 2 (two) times daily. For high blood pressure 02/09/17  Yes Nwoko, Agnes I, NP  cyclobenzaprine (FLEXERIL) 5 MG tablet Take 5 mg by mouth 2 (two) times daily.   Yes [provider]  dicyclomine (BENTYL) 20 MG tablet Take 1 tablet (20 mg total) by mouth every 8 (eight) hours as needed for spasms. 02/09/17  Yes Lindell Spar I, NP  Dulaglutide (TRULICITY) 1.5 JW/1.1BJ SOPN Inject 1.5 mg into the skin every Friday. For diabetes management 02/09/17  Yes Lindell Spar I, NP  fenofibrate 160 MG tablet Take 1 tablet (160 mg total) by mouth daily. For high Cholesterol 02/10/17   Yes Lindell Spar I, NP  gabapentin (NEURONTIN) 400 MG capsule Take 1 capsule (400 mg total) by mouth 3 (three) times daily. For agitation/diabetic neuropathy 02/09/17  Yes Lindell Spar I, NP  hydrOXYzine (ATARAX/VISTARIL) 50 MG tablet Take 1 tablet (50 mg total) by mouth 3 (three) times daily as needed for anxiety. 02/09/17  Yes Lindell Spar I, NP  ibuprofen (ADVIL,MOTRIN) 800 MG tablet Take 800 mg by mouth every 6 (six) hours as needed (pain).    Yes [provider]  insulin aspart (NOVOLOG) 100 UNIT/ML injection Inject 6 Units into the skin 3 (three) times daily with meals. For diabetes management 02/09/17  Yes Lindell Spar I, NP  insulin detemir (LEVEMIR) 100 UNIT/ML injection Inject 0.5 mLs (50 Units total) into the skin 2 (two) times daily. For diabetes management 02/09/17  Yes Lindell Spar I, NP  lactulose (CHRONULAC) 10 GM/15ML solution Take 20 g by mouth daily.    Yes [provider]  levETIRAcetam (KEPPRA) 500 MG tablet Take 1 tablet (500 mg total) by mouth 2 (two) times daily. For mood stabilization 02/09/17  Yes Nwoko, Herbert Pun I, NP  lisinopril (PRINIVIL,ZESTRIL) 10 MG tablet Take 1 tablet (10 mg total) by mouth daily. For high blood pressure 02/09/17  Yes Nwoko, Agnes I, NP  pantoprazole (PROTONIX) 40 MG tablet Take 1 tablet (40 mg total) by mouth daily. For acid reflux 02/09/17  Yes Lindell Spar I, NP  QUEtiapine (SEROQUEL) 100 MG tablet Take 5 tablets (500 mg total) by mouth at bedtime. For mood control 02/09/17  Yes Nwoko, Herbert Pun I, NP  QUEtiapine (SEROQUEL) 50 MG tablet Take 1 tablet (50 mg total) by mouth 2 (two) times daily. For agitation/mood control 02/09/17  Yes Lindell Spar I, NP  sertraline (ZOLOFT) 100 MG tablet Take 200 mg by mouth daily.   Yes [provider]  topiramate (TOPAMAX) 100 MG tablet Take 100 mg by mouth 2 (two) times daily.   Yes [provider]  traZODone (DESYREL) 50 MG tablet Take 1 tablet (50 mg total) by mouth at bedtime as needed for  sleep. 02/09/17  Yes Lindell Spar I, NP  metroNIDAZOLE (FLAGYL) 500 MG tablet Take 1 tablet (500 mg total) by mouth every 12 (twelve) hours. Patient not taking: Reported on 03/13/2017 02/21/17   Ethelene Hal, NP    Family History Family History  Problem Relation Age of Onset  . Coronary artery disease Maternal Grandmother   . Diabetes type II Maternal Grandmother   . Cancer Maternal Grandmother   . Hypertension Mother   . Hypertension Father     Social History Social History   Tobacco Use  . Smoking status: Never Smoker  . Smokeless tobacco: Never Used  Substance Use Topics  . Alcohol use: No  . Drug  use: No     Allergies   Fish-derived products; Geodon [ziprasidone hcl]; Haldol [haloperidol lactate]; Buprenorphine hcl; Compazine [prochlorperazine]; Morphine and related; and Toradol [ketorolac tromethamine]   Review of Systems Review of Systems  All systems reviewed and negative, other than as noted in HPI.  Physical Exam Updated Vital Signs BP 138/85 (BP Location: Left Arm)   Pulse 91   Temp 98.1 F (36.7 C) (Oral)   Resp 18   Ht 5\' 4"  (1.626 m)   Wt 105.9 kg (233 lb 7.5 oz)   LMP 01/25/2017 Comment: neg hCG  SpO2 93%   BMI 40.07 kg/m    Physical Exam  Constitutional: She appears well-developed and well-nourished. No distress.  HENT:  Head: Normocephalic and atraumatic.  Eyes: Conjunctivae are normal. Right eye exhibits no discharge. Left eye exhibits no discharge.  Neck: Neck supple.  Cardiovascular: Normal rate, regular rhythm and normal heart sounds. Exam reveals no gallop and no friction rub.  No murmur heard. Pulmonary/Chest: Effort normal and breath sounds normal. No respiratory distress.  Abdominal: Soft. She exhibits no distension. There is tenderness.  Suprapubic to right lower quadrant tenderness without rebound or guarding.  No distention.  Musculoskeletal: She exhibits no edema or tenderness.  Neurological: She is alert.  Skin: Skin is  warm and dry.  Psychiatric: She has a normal mood and affect. Her behavior is normal. Thought content normal.  Nursing note and vitals reviewed.    ED Treatments / Results  Labs (all labs ordered are listed, but only abnormal results are displayed) Labs Reviewed  MRSA PCR SCREENING - Abnormal; Notable for the following components:      Result Value   MRSA by PCR POSITIVE (*)    All other components within normal limits  COMPREHENSIVE METABOLIC PANEL - Abnormal; Notable for the following components:   Sodium 128 (*)    Chloride 94 (*)    CO2 17 (*)    Glucose, Bld 607 (*)    BUN <5 (*)    AST 48 (*)    Anion gap 17 (*)    All other components within normal limits  CBC - Abnormal; Notable for the following components:   Hemoglobin 11.9 (*)    HCT 35.8 (*)    RDW 15.9 (*)    Platelets 122 (*)    All other components within normal limits  URINALYSIS, ROUTINE W REFLEX MICROSCOPIC - Abnormal; Notable for the following components:   Color, Urine STRAW (*)    APPearance HAZY (*)    Specific Gravity, Urine 1.031 (*)    Glucose, UA >=500 (*)    Hgb urine dipstick SMALL (*)    Leukocytes, UA MODERATE (*)    Bacteria, UA RARE (*)    Squamous Epithelial / LPF 0-5 (*)    All other components within normal limits  BASIC METABOLIC PANEL - Abnormal; Notable for the following components:   Sodium 134 (*)    Chloride 100 (*)    Glucose, Bld 248 (*)    BUN <5 (*)    Calcium 8.4 (*)    All other components within normal limits  GLUCOSE, CAPILLARY - Abnormal; Notable for the following components:   Glucose-Capillary 259 (*)    All other components within normal limits  BASIC METABOLIC PANEL - Abnormal; Notable for the following components:   Sodium 133 (*)    CO2 21 (*)    Glucose, Bld 353 (*)    BUN <5 (*)    Calcium 8.0 (*)  All other components within normal limits  GLUCOSE, CAPILLARY - Abnormal; Notable for the following components:   Glucose-Capillary 309 (*)    All other  components within normal limits  GLUCOSE, CAPILLARY - Abnormal; Notable for the following components:   Glucose-Capillary 259 (*)    All other components within normal limits  GLUCOSE, CAPILLARY - Abnormal; Notable for the following components:   Glucose-Capillary 234 (*)    All other components within normal limits  GLUCOSE, CAPILLARY - Abnormal; Notable for the following components:   Glucose-Capillary 183 (*)    All other components within normal limits  CBG MONITORING, ED - Abnormal; Notable for the following components:   Glucose-Capillary >600 (*)    All other components within normal limits  CBG MONITORING, ED - Abnormal; Notable for the following components:   Glucose-Capillary >600 (*)    All other components within normal limits  CBG MONITORING, ED - Abnormal; Notable for the following components:   Glucose-Capillary 542 (*)    All other components within normal limits  CBG MONITORING, ED - Abnormal; Notable for the following components:   Glucose-Capillary 422 (*)    All other components within normal limits  CBG MONITORING, ED - Abnormal; Notable for the following components:   Glucose-Capillary 374 (*)    All other components within normal limits  CBG MONITORING, ED - Abnormal; Notable for the following components:   Glucose-Capillary 332 (*)    All other components within normal limits  CBG MONITORING, ED - Abnormal; Notable for the following components:   Glucose-Capillary 291 (*)    All other components within normal limits  CBG MONITORING, ED - Abnormal; Notable for the following components:   Glucose-Capillary 246 (*)    All other components within normal limits  CBG MONITORING, ED - Abnormal; Notable for the following components:   Glucose-Capillary 262 (*)    All other components within normal limits  CBG MONITORING, ED - Abnormal; Notable for the following components:   Glucose-Capillary 247 (*)    All other components within normal limits  CBG MONITORING,  ED - Abnormal; Notable for the following components:   Glucose-Capillary 310 (*)    All other components within normal limits  CBG MONITORING, ED - Abnormal; Notable for the following components:   Glucose-Capillary 327 (*)    All other components within normal limits  CBG MONITORING, ED - Abnormal; Notable for the following components:   Glucose-Capillary 263 (*)    All other components within normal limits  CBG MONITORING, ED - Abnormal; Notable for the following components:   Glucose-Capillary 293 (*)    All other components within normal limits  CBG MONITORING, ED - Abnormal; Notable for the following components:   Glucose-Capillary 299 (*)    All other components within normal limits  URINE CULTURE  LIPASE, BLOOD  COMPREHENSIVE METABOLIC PANEL  HEPATITIS PANEL, ACUTE  HEMOGLOBIN A1C  I-STAT BETA HCG BLOOD, ED ([facility], [facility], [facility] ONLY)    EKG  EKG Interpretation None       Radiology Ct Abdomen Pelvis W Contrast  Result Date: 03/13/2017 CLINICAL DATA:  Right lower quadrant abdominal pain. EXAM: CT ABDOMEN AND PELVIS WITH CONTRAST TECHNIQUE: Multidetector CT imaging of the abdomen and pelvis was performed using the standard protocol following bolus administration of intravenous contrast. CONTRAST:  139mL ISOVUE-300 IOPAMIDOL (ISOVUE-300) INJECTION 61% COMPARISON:  Multiple prior exams most recently 02/17/2017 FINDINGS: Lower chest: The lung bases are clear. Hepatobiliary: Chronic marked hepatomegaly. Chronic advanced hepatic steatosis. More confluent  hypodensity in the anterior liver, intermittently present on prior exams consistent with more focal fatty deposition. Clips in the gallbladder fossa postcholecystectomy. No biliary dilatation. Pancreas: No ductal dilatation or inflammation. Spleen: Chronic splenomegaly. Craniocaudal length 22 cm. No focal abnormality. Adrenals/Urinary Tract: Normal adrenal glands. No hydronephrosis or perinephric edema. Homogeneous renal enhancement. Urinary  bladder is physiologically distended without wall thickening. Stomach/Bowel: Stomach distended with ingested contents. No bowel obstruction, inflammation or wall thickening. Normal appendix. Vascular/Lymphatic: Recannulized umbilical vein. Chronic abdominal wall collaterals. Portal vein is patent. No acute vascular finding. No enlarged abdominal or pelvic lymph nodes. Reproductive: Uterus and bilateral adnexa are unremarkable. Other: Postsurgical change of the anterior abdominal wall. No ascites or free air. No intra-abdominal abscess. Musculoskeletal: There are no acute or suspicious osseous abnormalities. IMPRESSION: 1. Severe hepatosplenomegaly and hepatic steatosis, chronic and unchanged. More confluent focal fatty infiltration throughout the anterior liver. 2. No acute finding. Electronically Signed   By: Jeb Levering M.D.   On: 03/13/2017 23:23    Procedures Procedures (including critical care time)  CRITICAL CARE Performed by: Virgel Manifold Total critical care time: 35 minutes Critical care time was exclusive of separately billable procedures and treating other patients. Critical care was necessary to treat or prevent imminent or life-threatening deterioration. Critical care was time spent personally by me on the following activities: development of treatment plan with patient and/or surrogate as well as nursing, discussions with consultants, evaluation of patient's response to treatment, examination of patient, obtaining history from patient or surrogate, ordering and performing treatments and interventions, ordering and review of laboratory studies, ordering and review of radiographic studies, pulse oximetry and re-evaluation of patient's condition.   Medications Ordered in ED Medications  dextrose 50 % solution 25 mL (not administered)  0.9 % NaCl with KCl 20 mEq/ L  infusion ( Intravenous Stopped 03/14/17 1207)  busPIRone (BUSPAR) tablet 15 mg (15 mg Oral Given 03/14/17 2142)    chlorproMAZINE (THORAZINE) tablet 50 mg (50 mg Oral Given 03/14/17 2200)  cloNIDine (CATAPRES) tablet 0.1 mg (0.1 mg Oral Given 03/14/17 2142)  levETIRAcetam (KEPPRA) tablet 500 mg (500 mg Oral Given 03/14/17 2139)  lisinopril (PRINIVIL,ZESTRIL) tablet 10 mg (10 mg Oral Given 03/14/17 1218)  pantoprazole (PROTONIX) EC tablet 40 mg (40 mg Oral Given 03/14/17 0901)  QUEtiapine (SEROQUEL) tablet 500 mg (500 mg Oral Given 03/14/17 2138)  QUEtiapine (SEROQUEL) tablet 50 mg (50 mg Oral Given 03/14/17 0903)  sertraline (ZOLOFT) tablet 200 mg (200 mg Oral Given 03/14/17 0904)  topiramate (TOPAMAX) tablet 100 mg (100 mg Oral Given 03/14/17 2142)  enoxaparin (LOVENOX) injection 40 mg (40 mg Subcutaneous Given 03/14/17 2141)  ondansetron (ZOFRAN) tablet 4 mg (not administered)    Or  ondansetron (ZOFRAN) injection 4 mg (not administered)  metoCLOPramide (REGLAN) injection 5 mg (5 mg Intravenous Given 03/14/17 2141)  dicyclomine (BENTYL) tablet 20 mg (not administered)  0.9 %  sodium chloride infusion ( Intravenous New Bag/Given 03/14/17 1600)  insulin regular (NOVOLIN R,HUMULIN R) 100 Units in sodium chloride 0.9 % 100 mL (1 Units/mL) infusion ( Intravenous Not Given 03/14/17 1710)  insulin aspart (novoLOG) injection 0-20 Units (not administered)  insulin aspart (novoLOG) injection 0-5 Units (0 Units Subcutaneous Not Given 03/14/17 2128)  insulin aspart (novoLOG) injection 6 Units (not administered)  insulin detemir (LEVEMIR) injection 50 Units (not administered)  mupirocin ointment (BACTROBAN) 2 % 1 application (1 application Nasal Given 03/14/17 2138)  Chlorhexidine Gluconate Cloth 2 % PADS 6 each (not administered)  sodium chloride 0.9 % bolus  1,000 mL (0 mLs Intravenous Stopped 03/13/17 2318)  potassium chloride 10 mEq in 100 mL IVPB (0 mEq Intravenous Stopped 03/14/17 0106)  fentaNYL (SUBLIMAZE) injection 50 mcg (50 mcg Intravenous Given 03/13/17 2302)  ondansetron (ZOFRAN) injection 4 mg (4 mg Intravenous Given  03/13/17 2302)  iopamidol (ISOVUE-300) 61 % injection (100 mLs Intravenous Contrast Given 03/13/17 2236)  fluconazole (DIFLUCAN) tablet 150 mg (150 mg Oral Given 03/14/17 0055)  fentaNYL (SUBLIMAZE) injection 25 mcg (25 mcg Intravenous Given 03/14/17 0344)  insulin detemir (LEVEMIR) injection 50 Units (50 Units Subcutaneous Given 03/14/17 1729)     Initial Impression / Assessment and Plan / ED Course  I have reviewed the triage vital signs and the nursing notes.  Pertinent labs & imaging results that were available during my care of the patient were reviewed by me and considered in my medical decision making (see chart for details).     32 year old female with abdominal pain and nausea/vomiting.  She is in diabetic ketoacidosis.  IV fluids, electrolyte management.  Insulin.  Unclear as to exact precipitant.  She states that she has been out of her testing strips but has been compliant with her medications.  It appears that she is on fixed dose insulin.  She is afebrile.  CT of the abdomen pelvis does not show any acute abnormality.  Final Clinical Impressions(s) / ED Diagnoses   Final diagnoses:  Diabetic ketoacidosis without coma associated with other specified diabetes mellitus [facility])    ED Discharge Orders    None      Virgel Manifold, MD 03/14/17 2345

## 2017-03-14 NOTE — Progress Notes (Signed)
PROGRESS NOTE  Tina Saunders OXB:[id number] DOB: 1985-07-06 DOA: 03/13/2017 PCP: Javier Docker, MD  HPI/Recap of past 24 hours:  Blood sugar improving   Assessment/Plan: Principal Problem:   DKA (diabetic ketoacidoses) ([place]) Active Problems:   Obesity, unspecified   Depression   Vision loss of right eye   HTN (hypertension)   Schizoaffective disorder, bipolar type ([place])   Seizures ([place])  DKA She is admitted to icu/stepdwon on insulin drip Gap closed this pm, transition to subQ insulin  Insulin dependent dm2, will check a1c  lft elevated Check hepatitis panel  Right lower quadrant pain, on exam ab soft, ct ab /pel no acute findings Continue home meds prn bentyl   Morbid obesity: Body mass index is 40.07 kg/m.  Presumed vaginal candidiasis- completed a course of metronidazole and Cipro for BV, prescribed 3 weeks ago. -Diflucan 150 x 1 now, repeat X1 in 72 hrs, uncontrolled diabetes  Schizoaffective disorder, BPD- significant psych hx. - Cont home seroquel and setraline  Seizure dsd- last seizure Nov/2018.  - Cont home keppra, topirate. - seizure precautions   DVT prophylaxis: Lovenox  Code Status: Full  Family Communication: None physically at bedside, but Mother via Video call on laptop. Disposition Plan: 1-2 days   Consultants:  none  Procedures:  none  Antibiotics:  Rocephin x1   Objective: BP 138/85 (BP Location: Left Arm)   Pulse 91   Temp 97.6 F (36.4 C) (Axillary)   Resp 18   Ht 5\' 4"  (1.626 m)   Wt 105.9 kg (233 lb 7.5 oz)   LMP 01/25/2017 Comment: neg hCG  SpO2 93%   BMI 40.07 kg/m   Intake/Output Summary (Last 24 hours) at 03/14/2017 1721 Last data filed at 03/14/2017 1300 Gross per 24 hour  Intake 3040 ml  Output 400 ml  Net 2640 ml   Filed Weights   03/14/17 1252  Weight: 105.9 kg (233 lb 7.5 oz)    Exam: Patient is examined daily including today on 03/14/2017, exams remain the same as of yesterday except  that has changed    General:  NAD, obese  Cardiovascular: RRR  Respiratory: CTABL  Abdomen: Soft/ND/NT, positive BS  Musculoskeletal: No Edema  Neuro: alert, oriented   Data Reviewed: Basic Metabolic Panel: Recent Labs  Lab 03/13/17 1950 03/14/17 0450 03/14/17 1440  NA 128* 134* 133*  K 3.7 3.5 4.1  CL 94* 100* 104  CO2 17* 22 21*  GLUCOSE 607* 248* 353*  BUN <5* <5* <5*  CREATININE 0.71 0.46 0.45  CALCIUM 8.9 8.4* 8.0*   Liver Function Tests: Recent Labs  Lab 03/13/17 1950  AST 48*  ALT 24  ALKPHOS 122  BILITOT 0.8  PROT 8.1  ALBUMIN 4.1   Recent Labs  Lab 03/13/17 1950  LIPASE 32   No results for input(s): AMMONIA in the last 168 hours. CBC: Recent Labs  Lab 03/13/17 1950  WBC 4.3  HGB 11.9*  HCT 35.8*  MCV 78.9  PLT 122*   Cardiac Enzymes:   No results for input(s): CKTOTAL, CKMB, CKMBINDEX, TROPONINI in the last 168 hours. BNP (last 3 results) No results for input(s): BNP in the last 8760 hours.  ProBNP (last 3 results) No results for input(s): PROBNP in the last 8760 hours.  CBG: Recent Labs  Lab 03/14/17 1117 03/14/17 1220 03/14/17 1306 03/14/17 1450 03/14/17 1622  GLUCAP 293* 299* 259* 309* 259*    No results found for this or any previous visit (from the past 240  hour(s)).   Studies: Ct Abdomen Pelvis W Contrast  Result Date: 03/13/2017 CLINICAL DATA:  Right lower quadrant abdominal pain. EXAM: CT ABDOMEN AND PELVIS WITH CONTRAST TECHNIQUE: Multidetector CT imaging of the abdomen and pelvis was performed using the standard protocol following bolus administration of intravenous contrast. CONTRAST:  17mL ISOVUE-300 IOPAMIDOL (ISOVUE-300) INJECTION 61% COMPARISON:  Multiple prior exams most recently 02/17/2017 FINDINGS: Lower chest: The lung bases are clear. Hepatobiliary: Chronic marked hepatomegaly. Chronic advanced hepatic steatosis. More confluent hypodensity in the anterior liver, intermittently present on prior exams  consistent with more focal fatty deposition. Clips in the gallbladder fossa postcholecystectomy. No biliary dilatation. Pancreas: No ductal dilatation or inflammation. Spleen: Chronic splenomegaly. Craniocaudal length 22 cm. No focal abnormality. Adrenals/Urinary Tract: Normal adrenal glands. No hydronephrosis or perinephric edema. Homogeneous renal enhancement. Urinary bladder is physiologically distended without wall thickening. Stomach/Bowel: Stomach distended with ingested contents. No bowel obstruction, inflammation or wall thickening. Normal appendix. Vascular/Lymphatic: Recannulized umbilical vein. Chronic abdominal wall collaterals. Portal vein is patent. No acute vascular finding. No enlarged abdominal or pelvic lymph nodes. Reproductive: Uterus and bilateral adnexa are unremarkable. Other: Postsurgical change of the anterior abdominal wall. No ascites or free air. No intra-abdominal abscess. Musculoskeletal: There are no acute or suspicious osseous abnormalities. IMPRESSION: 1. Severe hepatosplenomegaly and hepatic steatosis, chronic and unchanged. More confluent focal fatty infiltration throughout the anterior liver. 2. No acute finding. Electronically Signed   By: Jeb Levering M.D.   On: 03/13/2017 23:23    Scheduled Meds: . busPIRone  15 mg Oral TID  . chlorproMAZINE  50 mg Oral TID  . cloNIDine  0.1 mg Oral BID  . enoxaparin (LOVENOX) injection  40 mg Subcutaneous Q24H  . insulin detemir  50 Units Subcutaneous Once  . levETIRAcetam  500 mg Oral BID  . lisinopril  10 mg Oral Daily  . metoCLOPramide (REGLAN) injection  5 mg Intravenous Q8H  . pantoprazole  40 mg Oral Daily  . QUEtiapine  50 mg Oral Daily  . QUEtiapine  500 mg Oral QHS  . sertraline  200 mg Oral Daily  . topiramate  100 mg Oral BID    Continuous Infusions: . sodium chloride 75 mL/hr at 03/14/17 1600  . insulin (NOVOLIN-R) infusion       Time spent: 35 mins from 3pm to 3:35 pm I have personally reviewed and  interpreted on  03/14/2017 daily labs, tele strips, imagings as discussed above under date review session and assessment and plans.  I reviewed all nursing notes, pharmacy notes,   vitals, pertinent old records  I have discussed plan of care as described above with RN , patient and family on 03/14/2017   Florencia Reasons MD, PhD  [facility] Pager [phone]. If 7PM-7AM, please contact night-coverage at [email or web address], password [facility] 03/14/2017, 5:21 PM  LOS: 0 days

## 2017-03-14 NOTE — ED Notes (Signed)
Pt complaining of nausea, floor coverage paged.

## 2017-03-14 NOTE — ED Notes (Signed)
ED TO INPATIENT HANDOFF REPORT  Name/Age/Gender Tina Saunders 32 y.o. female  Code Status    Code Status Orders  (From admission, onward)        Start     Ordered   03/14/17 0843  Full code  Continuous     03/14/17 0843    Code Status History    Date Active Date Inactive Code Status Order ID Comments User Context   02/26/2017 23:34 02/28/2017 18:06 Full Code [id number]  Beverely Pace ED   02/20/2017 04:58 02/21/2017 20:31 Full Code [id number]  Larene Pickett, PA-C ED   02/06/2017 17:06 02/09/2017 19:32 Full Code [id number]  Vicenta Aly, NP Inpatient   02/06/2017 04:47 02/06/2017 11:36 Full Code [id number]  Orpah Greek, MD ED   02/05/2017 04:24 02/05/2017 15:08 Full Code [id number]  Joanne Gavel, PA-C ED   12/11/2016 00:33 12/12/2016 15:12 Full Code [id number]  Etta Quill, DO ED   11/02/2016 07:45 11/04/2016 15:45 Full Code [id number]  Ethelene Hal, NP Inpatient   11/01/2016 02:12 11/02/2016 02:15 Full Code [id number]  Ward, Delice Bison, DO ED   05/29/2014 23:20 05/30/2014 16:58 Full Code [id number]  Pattricia Boss, MD ED   03/03/2014 01:36 03/03/2014 15:44 Full Code [id number]  Clayton Bibles, PA-C ED   03/03/2014 01:25 03/03/2014 01:36 Full Code [id number]  Clayton Bibles, PA-C ED   04/15/2013 22:13 04/17/2013 15:01 Full Code [id number]  Rise Patience, MD Inpatient   05/01/2012 19:26 05/02/2012 21:37 Full Code [id number]  Margarita Mail, PA-C ED   12/03/2011 18:37 12/07/2011 05:57 Full Code [id number]  Blanchie Dessert, MD ED   08/26/2011 00:39 08/29/2011 17:47 Full Code [id number]  Theressa Millard, MD ED   07/29/2011 12:01 07/30/2011 12:39 Full Code [id number]  Levester Fresh, RN ED   07/28/2011 17:20 07/29/2011 12:01 Full Code [id number]  Babette Relic, MD ED      Home/SNF/Other Home  Chief Complaint lower abdominal pain, emesis  Level of Care/Admitting Diagnosis ED Disposition    ED Disposition Condition [facility] Area: [place] [[id number]]   Level of Care: Stepdown [14]  Admit to SDU based on following criteria: Other see comments  Comments: DKA  Diagnosis: DKA (diabetic ketoacidoses) [facility]) [[id number]]  Admitting Physician: Bethena Roys [phone]  Attending Physician: Bethena Roys [email or web address]  Estimated length of stay: past midnight tomorrow  Certification:: I certify this patient will need inpatient services for at least 2 midnights  PT Class (Do Not Modify): Inpatient [101]  PT Acc Code (Do Not Modify): Private [1]       Medical History Past Medical History:  Diagnosis Date  . Anxiety   . Bipolar 1 disorder ([place])   . Cancer of abdominal wall   . Depression   . Diabetes mellitus without complication ([place])   . Hypertension   . Obesity   . Obesity   . Polycystic ovarian syndrome 07/01/2011   Patient report  . 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 Schizophrenia ([place])     Allergies Allergies  Allergen Reactions  . Fish-Derived Products Anaphylaxis    Can only eat FLounder  . Geodon [Ziprasidone Hcl] Other (See Comments)    Face pulls, cant swallow - Locked Jaw  . Haldol [Haloperidol Lactate] Other (See Comments)    Face pulls, can't swallow - Locked Jaw  . Buprenorphine Hcl Hives, Itching, Rash and Other (See Comments)    GI upset  . Compazine [Prochlorperazine] Other (See Comments)  anxiety and hyperactivity  . Morphine And Related Hives, Itching, Rash and Other (See Comments)    GI upset  . Toradol [Ketorolac Tromethamine] Other (See Comments)    Anxiety and hyperactivity    IV Location/Drains/Wounds Patient Lines/Drains/Airways Status   Active Line/Drains/Airways    Name:   Placement date:   Placement time:   Site:   Days:   Peripheral IV 03/13/17 Left;Posterior;Upper Forearm   03/13/17    2130    Forearm   1   Peripheral IV 03/13/17 Left;Lateral Wrist   03/13/17    2140    Wrist   1          Labs/Imaging Results for orders placed or performed during the hospital encounter of 03/13/17 (from the past  48 hour(s))  CBG monitoring, ED     Status: Abnormal   Collection Time: 03/13/17  6:55 PM  Result Value Ref Range   Glucose-Capillary >600 (HH) 65 - 99 mg/dL  Lipase, blood     Status: None   Collection Time: 03/13/17  7:50 PM  Result Value Ref Range   Lipase 32 11 - 51 U/L    Comment: Performed at [facility], [place] [address] [place], [place] [address]  Comprehensive metabolic panel     Status: Abnormal   Collection Time: 03/13/17  7:50 PM  Result Value Ref Range   Sodium 128 (L) 135 - 145 mmol/L   Potassium 3.7 3.5 - 5.1 mmol/L   Chloride 94 (L) 101 - 111 mmol/L   CO2 17 (L) 22 - 32 mmol/L   Glucose, Bld 607 (HH) 65 - 99 mg/dL    Comment: CRITICAL RESULT CALLED TO, READ BACK BY AND VERIFIED WITH: LEONARD,S. RN @2041  ON 02.19.19 BY COHEN,K    BUN <5 (L) 6 - 20 mg/dL   Creatinine, Ser 0.71 0.44 - 1.00 mg/dL   Calcium 8.9 8.9 - 10.3 mg/dL   Total Protein 8.1 6.5 - 8.1 g/dL   Albumin 4.1 3.5 - 5.0 g/dL   AST 48 (H) 15 - 41 U/L   ALT 24 14 - 54 U/L   Alkaline Phosphatase 122 38 - 126 U/L   Total Bilirubin 0.8 0.3 - 1.2 mg/dL   GFR calc non Af Amer >60 >60 mL/min   GFR calc Af Amer >60 >60 mL/min    Comment: (NOTE) The eGFR has been calculated using the CKD EPI equation. This calculation has not been validated in all clinical situations. eGFR's persistently <60 mL/min signify possible Chronic Kidney Disease.    Anion gap 17 (H) 5 - 15    Comment: Performed at [facility], [place] [address] [place], [place] [address]  CBC     Status: Abnormal   Collection Time: 03/13/17  7:50 PM  Result Value Ref Range   WBC 4.3 4.0 - 10.5 K/uL   RBC 4.54 3.87 - 5.11 MIL/uL   Hemoglobin 11.9 (L) 12.0 - 15.0 g/dL   HCT 35.8 (L) 36.0 - 46.0 %   MCV 78.9 78.0 - 100.0 fL   MCH 26.2 26.0 - 34.0 pg   MCHC 33.2 30.0 - 36.0 g/dL   RDW 15.9 (H) 11.5 - 15.5 %   Platelets 122 (L) 150 - 400 K/uL    Comment: Performed at [facility], [place]  [address] [place], [place] [address]  Urinalysis, Routine w reflex microscopic     Status: Abnormal   Collection Time: 03/13/17  7:53 PM  Result Value Ref  Range   Color, Urine STRAW (A) YELLOW   APPearance HAZY (A) CLEAR   Specific Gravity, Urine 1.031 (H) 1.005 - 1.030   pH 6.0 5.0 - 8.0   Glucose, UA >=500 (A) NEGATIVE mg/dL   Hgb urine dipstick SMALL (A) NEGATIVE   Bilirubin Urine NEGATIVE NEGATIVE   Ketones, ur NEGATIVE NEGATIVE mg/dL   Protein, ur NEGATIVE NEGATIVE mg/dL   Nitrite NEGATIVE NEGATIVE   Leukocytes, UA MODERATE (A) NEGATIVE   RBC / HPF 0-5 0 - 5 RBC/hpf   WBC, UA 0-5 0 - 5 WBC/hpf   Bacteria, UA RARE (A) NONE SEEN   Squamous Epithelial / LPF 0-5 (A) NONE SEEN   Mucus PRESENT     Comment: Performed at [facility], [place] [address], [place], [place] [address]  I-Stat beta hCG blood, ED     Status: None   Collection Time: 03/13/17  8:05 PM  Result Value Ref Range   I-stat hCG, quantitative <5.0 <5 mIU/mL   Comment 3            Comment:   GEST. AGE      CONC.  (mIU/mL)   <=1 WEEK        5 - 50     2 WEEKS       50 - 500     3 WEEKS       100 - 10,000     4 WEEKS     1,000 - 30,000        FEMALE AND NON-PREGNANT FEMALE:     LESS THAN 5 mIU/mL   CBG monitoring, ED     Status: Abnormal   Collection Time: 03/13/17  9:41 PM  Result Value Ref Range   Glucose-Capillary >600 (HH) 65 - 99 mg/dL   Comment 1 Notify RN    Comment 2 Document in Chart   CBG monitoring, ED     Status: Abnormal   Collection Time: 03/13/17 11:15 PM  Result Value Ref Range   Glucose-Capillary 542 (HH) 65 - 99 mg/dL  CBG monitoring, ED     Status: Abnormal   Collection Time: 03/14/17 12:17 AM  Result Value Ref Range   Glucose-Capillary 422 (H) 65 - 99 mg/dL  CBG monitoring, ED     Status: Abnormal   Collection Time: 03/14/17  1:26 AM  Result Value Ref Range   Glucose-Capillary 374 (H) 65 - 99 mg/dL  CBG monitoring, ED     Status: Abnormal   Collection Time: 03/14/17   2:32 AM  Result Value Ref Range   Glucose-Capillary 332 (H) 65 - 99 mg/dL  CBG monitoring, ED     Status: Abnormal   Collection Time: 03/14/17  3:43 AM  Result Value Ref Range   Glucose-Capillary 291 (H) 65 - 99 mg/dL  CBG monitoring, ED     Status: Abnormal   Collection Time: 03/14/17  4:47 AM  Result Value Ref Range   Glucose-Capillary 246 (H) 65 - 99 mg/dL   Comment 1 Notify RN    Comment 2 Document in Chart   Basic metabolic panel     Status: Abnormal   Collection Time: 03/14/17  4:50 AM  Result Value Ref Range   Sodium 134 (L) 135 - 145 mmol/L   Potassium 3.5 3.5 - 5.1 mmol/L   Chloride 100 (L) 101 - 111 mmol/L   CO2 22 22 - 32 mmol/L   Glucose, Bld 248 (H) 65 - 99 mg/dL   BUN <5 (  L) 6 - 20 mg/dL   Creatinine, Ser 0.46 0.44 - 1.00 mg/dL   Calcium 8.4 (L) 8.9 - 10.3 mg/dL   GFR calc non Af Amer >60 >60 mL/min   GFR calc Af Amer >60 >60 mL/min    Comment: (NOTE) The eGFR has been calculated using the CKD EPI equation. This calculation has not been validated in all clinical situations. eGFR's persistently <60 mL/min signify possible Chronic Kidney Disease.    Anion gap 12 5 - 15    Comment: Performed at [facility], [place] [address], [place], [place] [address]  CBG monitoring, ED     Status: Abnormal   Collection Time: 03/14/17  5:49 AM  Result Value Ref Range   Glucose-Capillary 262 (H) 65 - 99 mg/dL  CBG monitoring, ED     Status: Abnormal   Collection Time: 03/14/17  7:05 AM  Result Value Ref Range   Glucose-Capillary 247 (H) 65 - 99 mg/dL  CBG monitoring, ED     Status: Abnormal   Collection Time: 03/14/17  8:08 AM  Result Value Ref Range   Glucose-Capillary 310 (H) 65 - 99 mg/dL  CBG monitoring, ED     Status: Abnormal   Collection Time: 03/14/17  9:14 AM  Result Value Ref Range   Glucose-Capillary 327 (H) 65 - 99 mg/dL  CBG monitoring, ED     Status: Abnormal   Collection Time: 03/14/17 10:12 AM  Result Value Ref Range   Glucose-Capillary  263 (H) 65 - 99 mg/dL  CBG monitoring, ED     Status: Abnormal   Collection Time: 03/14/17 11:17 AM  Result Value Ref Range   Glucose-Capillary 293 (H) 65 - 99 mg/dL   Ct Abdomen Pelvis W Contrast  Result Date: 03/13/2017 CLINICAL DATA:  Right lower quadrant abdominal pain. EXAM: CT ABDOMEN AND PELVIS WITH CONTRAST TECHNIQUE: Multidetector CT imaging of the abdomen and pelvis was performed using the standard protocol following bolus administration of intravenous contrast. CONTRAST:  175m ISOVUE-300 IOPAMIDOL (ISOVUE-300) INJECTION 61% COMPARISON:  Multiple prior exams most recently 02/17/2017 FINDINGS: Lower chest: The lung bases are clear. Hepatobiliary: Chronic marked hepatomegaly. Chronic advanced hepatic steatosis. More confluent hypodensity in the anterior liver, intermittently present on prior exams consistent with more focal fatty deposition. Clips in the gallbladder fossa postcholecystectomy. No biliary dilatation. Pancreas: No ductal dilatation or inflammation. Spleen: Chronic splenomegaly. Craniocaudal length 22 cm. No focal abnormality. Adrenals/Urinary Tract: Normal adrenal glands. No hydronephrosis or perinephric edema. Homogeneous renal enhancement. Urinary bladder is physiologically distended without wall thickening. Stomach/Bowel: Stomach distended with ingested contents. No bowel obstruction, inflammation or wall thickening. Normal appendix. Vascular/Lymphatic: Recannulized umbilical vein. Chronic abdominal wall collaterals. Portal vein is patent. No acute vascular finding. No enlarged abdominal or pelvic lymph nodes. Reproductive: Uterus and bilateral adnexa are unremarkable. Other: Postsurgical change of the anterior abdominal wall. No ascites or free air. No intra-abdominal abscess. Musculoskeletal: There are no acute or suspicious osseous abnormalities. IMPRESSION: 1. Severe hepatosplenomegaly and hepatic steatosis, chronic and unchanged. More confluent focal fatty infiltration  throughout the anterior liver. 2. No acute finding. Electronically Signed   By: MJeb LeveringM.D.   On: 03/13/2017 23:23    Pending Labs Unresulted Labs (From admission, onward)   Start     Ordered   03/14/17 03335 Basic metabolic panel  Once,   R     03/14/17 0137   03/13/17 2253  Culture, Urine  Add-on,   R     03/13/17 2252  Vitals/Pain Today's Vitals   03/14/17 0700 03/14/17 0730 03/14/17 0900 03/14/17 1000  BP: 128/72 (!) 147/86 130/70 124/72  Pulse: 96 96 93 92  Resp: 16 18 18 18   Temp:      TempSrc:      SpO2: 96% 98% 97% 96%  PainSc:        Isolation Precautions No active isolations  Medications Medications  insulin regular (NOVOLIN R,HUMULIN R) 100 Units in sodium chloride 0.9 % 100 mL (1 Units/mL) infusion (28 Units/hr Intravenous Rate/Dose Change 03/14/17 1119)  dextrose 50 % solution 25 mL (not administered)  dextrose 5 %-0.45 % sodium chloride infusion ( Intravenous New Bag/Given 03/14/17 0459)  0.9 % NaCl with KCl 20 mEq/ L  infusion ( Intravenous New Bag/Given 03/14/17 0901)  busPIRone (BUSPAR) tablet 15 mg (15 mg Oral Given 03/14/17 0902)  chlorproMAZINE (THORAZINE) tablet 50 mg (50 mg Oral Given 03/14/17 0901)  cloNIDine (CATAPRES) tablet 0.1 mg (0.1 mg Oral Given 03/14/17 0902)  levETIRAcetam (KEPPRA) tablet 500 mg (500 mg Oral Given 03/14/17 0902)  lisinopril (PRINIVIL,ZESTRIL) tablet 10 mg (not administered)  pantoprazole (PROTONIX) EC tablet 40 mg (40 mg Oral Given 03/14/17 0901)  QUEtiapine (SEROQUEL) tablet 500 mg (not administered)  QUEtiapine (SEROQUEL) tablet 50 mg (50 mg Oral Given 03/14/17 0903)  sertraline (ZOLOFT) tablet 200 mg (200 mg Oral Given 03/14/17 0904)  topiramate (TOPAMAX) tablet 100 mg (100 mg Oral Given 03/14/17 0904)  enoxaparin (LOVENOX) injection 40 mg (not administered)  ondansetron (ZOFRAN) tablet 4 mg (not administered)    Or  ondansetron (ZOFRAN) injection 4 mg (not administered)  cefTRIAXone (ROCEPHIN) 1 g in sodium  chloride 0.9 % 100 mL IVPB (0 g Intravenous Stopped 03/14/17 0125)  metoCLOPramide (REGLAN) injection 5 mg (5 mg Intravenous Given 03/14/17 0631)  sodium chloride 0.9 % bolus 1,000 mL (0 mLs Intravenous Stopped 03/13/17 2318)  potassium chloride 10 mEq in 100 mL IVPB (0 mEq Intravenous Stopped 03/14/17 0106)  fentaNYL (SUBLIMAZE) injection 50 mcg (50 mcg Intravenous Given 03/13/17 2302)  ondansetron (ZOFRAN) injection 4 mg (4 mg Intravenous Given 03/13/17 2302)  iopamidol (ISOVUE-300) 61 % injection (100 mLs Intravenous Contrast Given 03/13/17 2236)  sodium chloride 0.9 % injection (  Given by Other 03/13/17 2303)  fluconazole (DIFLUCAN) tablet 150 mg (150 mg Oral Given 03/14/17 0055)  fentaNYL (SUBLIMAZE) injection 25 mcg (25 mcg Intravenous Given 03/14/17 0344)    Mobility ambulatory

## 2017-03-14 NOTE — ED Notes (Signed)
No new orders at this time, will page floor coverage

## 2017-03-14 NOTE — H&P (Signed)
History and Physical    Tina FEBO BOF:[id number] DOB: December 05, 1985 DOA: 03/13/2017  PCP: Javier Docker, MD   Patient coming from: Home  Chief Complaint: Abdominal pain  HPI: Tina Saunders is a 32 y.o. female with medical history significant for DM, Schizo- affective disorder, PSVT, Depression, Seizures, who presented to the ED today with complaints of right lower quadrant pain with nausea and one-time vomiting that started today.  Patient also reported that she felt like her blood sugar was high, she has not been able to check her blood sugars because she ran out of strips.  But she reports compliance with her insulin daily. Patient initially reported dysuria to me but later denied it, reports whitish vaginal discharge itchy non-foul-smelling of 3 weeks duration. She denies headaches or neck stiffness, no shortness of breath or cough no myalgias no fevers or chills.  ED Course: Pulse 778E, Bo systolic 423N. WBC- 4.3. Blood glucose- 607. Anion gap- 17, bicarb- 17. UA- mod leuks, rare bact. CT abdomen pelvis W contrast - severe hepatosplenomegaly with hepatic steatosis- chronic and unchanged, fatty liver. Pt was started on DKA protocol.   Review of Systems: As per HPI otherwise 10 point review of systems negative.  Past Medical History:  Diagnosis Date  . Anxiety   . Bipolar 1 disorder ([place])   . Cancer of abdominal wall   . Depression   . Diabetes mellitus without complication ([place])   . Hypertension   . Obesity   . Obesity   . Polycystic ovarian syndrome 07/01/2011   Patient report  . 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 Schizophrenia [facility])     Past Surgical History:  Procedure Laterality Date  . CHOLECYSTECTOMY    . COLONOSCOPY WITH PROPOFOL N/A 03/08/2017   Procedure: COLONOSCOPY WITH PROPOFOL;  Surgeon: Milus Banister, MD;  Location: [facility] ENDOSCOPY;  Service: Endoscopy;  Laterality: N/A;  . HERNIA REPAIR    . Ovarian Cyst Excision    . VARICOSE VEIN SURGERY       reports that   has never smoked. she has never used smokeless tobacco. She reports that she does not drink alcohol or use drugs.  Allergies  Allergen Reactions  . Fish-Derived Products Anaphylaxis    Can only eat FLounder  . Geodon [Ziprasidone Hcl] Other (See Comments)    Face pulls, cant swallow - Locked Jaw  . Haldol [Haloperidol Lactate] Other (See Comments)    Face pulls, can't swallow - Locked Jaw  . Buprenorphine Hcl Hives, Itching, Rash and Other (See Comments)    GI upset  . Compazine [Prochlorperazine] Other (See Comments)    anxiety and hyperactivity  . Morphine And Related Hives, Itching, Rash and Other (See Comments)    GI upset  . Toradol [Ketorolac Tromethamine] Other (See Comments)    Anxiety and hyperactivity    Family History  Problem Relation Age of Onset  . Coronary artery disease Maternal Grandmother   . Diabetes type II Maternal Grandmother   . Cancer Maternal Grandmother   . Hypertension Mother   . Hypertension Father     Prior to Admission medications   Medication Sig Start Date End Date Taking? Authorizing Provider  busPIRone (BUSPAR) 15 MG tablet Take 15 mg by mouth 3 (three) times daily.   Yes [provider]  chlorproMAZINE (THORAZINE) 50 MG tablet Take 1 tablet (50 mg total) by mouth 3 (three) times daily. For agitation/mood control 02/09/17  Yes Lindell Spar I, NP  cloNIDine (CATAPRES) 0.1 MG tablet  Take 1 tablet (0.1 mg total) by mouth 2 (two) times daily. For high blood pressure 02/09/17  Yes Nwoko, Agnes I, NP  cyclobenzaprine (FLEXERIL) 5 MG tablet Take 5 mg by mouth 2 (two) times daily.   Yes [provider]  dicyclomine (BENTYL) 20 MG tablet Take 1 tablet (20 mg total) by mouth every 8 (eight) hours as needed for spasms. 02/09/17  Yes Lindell Spar I, NP  Dulaglutide (TRULICITY) 1.5 RC/7.8LF SOPN Inject 1.5 mg into the skin every Friday. For diabetes management 02/09/17  Yes Lindell Spar I, NP  fenofibrate 160 MG tablet Take 1 tablet (160 mg  total) by mouth daily. For high Cholesterol 02/10/17  Yes Lindell Spar I, NP  gabapentin (NEURONTIN) 400 MG capsule Take 1 capsule (400 mg total) by mouth 3 (three) times daily. For agitation/diabetic neuropathy 02/09/17  Yes Lindell Spar I, NP  hydrOXYzine (ATARAX/VISTARIL) 50 MG tablet Take 1 tablet (50 mg total) by mouth 3 (three) times daily as needed for anxiety. 02/09/17  Yes Lindell Spar I, NP  ibuprofen (ADVIL,MOTRIN) 800 MG tablet Take 800 mg by mouth every 6 (six) hours as needed (pain).    Yes [provider]  insulin aspart (NOVOLOG) 100 UNIT/ML injection Inject 6 Units into the skin 3 (three) times daily with meals. For diabetes management 02/09/17  Yes Lindell Spar I, NP  insulin detemir (LEVEMIR) 100 UNIT/ML injection Inject 0.5 mLs (50 Units total) into the skin 2 (two) times daily. For diabetes management 02/09/17  Yes Lindell Spar I, NP  lactulose (CHRONULAC) 10 GM/15ML solution Take 20 g by mouth daily.    Yes [provider]  levETIRAcetam (KEPPRA) 500 MG tablet Take 1 tablet (500 mg total) by mouth 2 (two) times daily. For mood stabilization 02/09/17  Yes Nwoko, Herbert Pun I, NP  lisinopril (PRINIVIL,ZESTRIL) 10 MG tablet Take 1 tablet (10 mg total) by mouth daily. For high blood pressure 02/09/17  Yes Nwoko, Agnes I, NP  pantoprazole (PROTONIX) 40 MG tablet Take 1 tablet (40 mg total) by mouth daily. For acid reflux 02/09/17  Yes Lindell Spar I, NP  QUEtiapine (SEROQUEL) 100 MG tablet Take 5 tablets (500 mg total) by mouth at bedtime. For mood control 02/09/17  Yes Nwoko, Herbert Pun I, NP  QUEtiapine (SEROQUEL) 50 MG tablet Take 1 tablet (50 mg total) by mouth 2 (two) times daily. For agitation/mood control 02/09/17  Yes Lindell Spar I, NP  sertraline (ZOLOFT) 100 MG tablet Take 200 mg by mouth daily.   Yes [provider]  topiramate (TOPAMAX) 100 MG tablet Take 100 mg by mouth 2 (two) times daily.   Yes [provider]  traZODone (DESYREL) 50 MG tablet Take 1  tablet (50 mg total) by mouth at bedtime as needed for sleep. 02/09/17  Yes Lindell Spar I, NP  metroNIDAZOLE (FLAGYL) 500 MG tablet Take 1 tablet (500 mg total) by mouth every 12 (twelve) hours. Patient not taking: Reported on 03/13/2017 02/21/17   Ethelene Hal, NP    Physical Exam: Vitals:   03/13/17 1851 03/13/17 2145  BP: (!) 151/98 (!) 156/101  Pulse: (!) 124 (!) 120  Resp: 20 (!) 22  Temp: 97.8 F (36.6 C)   TempSrc: Oral   SpO2: 100% 99%    Constitutional: NAD, calm, comfortable Vitals:   03/13/17 1851 03/13/17 2145  BP: (!) 151/98 (!) 156/101  Pulse: (!) 124 (!) 120  Resp: 20 (!) 22  Temp: 97.8 F (36.6 C)   TempSrc: Oral  SpO2: 100% 99%   Eyes: PERRL, lids and conjunctivae normal ENMT: Mucous membranes are moist. Neck: normal, supple, no masses, no thyromegaly Respiratory: clear to auscultation bilaterally, no wheezing, no crackles. Normal respiratory effort. No accessory muscle use.  Cardiovascular: Tachycardic but regular rate and rhythm, no murmurs / rubs / gallops. No extremity edema. 2+ pedal pulses. No carotid bruits.  Abdomen: marked abdominal distension, without tenderness, no masses palpated. Bowel sounds positive.  Musculoskeletal: no clubbing / cyanosis. No joint deformity upper and lower extremities. Good ROM, no contractures. Normal muscle tone.  Skin: no rashes, lesions, ulcers. No induration Neurologic: CN 2-12 grossly intact. Sensation intact, DTR normal. Strength 5/5 in all 4.  Psychiatric: Normal judgment and insight. Alert and oriented x 3. Normal mood.   Labs on Admission: I have personally reviewed following labs and imaging studies  CBC: Recent Labs  Lab 03/13/17 1950  WBC 4.3  HGB 11.9*  HCT 35.8*  MCV 78.9  PLT 433*   Basic Metabolic Panel: Recent Labs  Lab 03/13/17 1950  NA 128*  K 3.7  CL 94*  CO2 17*  GLUCOSE 607*  BUN <5*  CREATININE 0.71  CALCIUM 8.9   Liver Function Tests: Recent Labs  Lab 03/13/17 1950    AST 48*  ALT 24  ALKPHOS 122  BILITOT 0.8  PROT 8.1  ALBUMIN 4.1   Recent Labs  Lab 03/13/17 1950  LIPASE 32   CBG: Recent Labs  Lab 03/08/17 1405 03/08/17 1505 03/13/17 1855 03/13/17 2141 03/13/17 2315  GLUCAP 288* 280* >600* >600* 542*   Urine analysis:    Component Value Date/Time   COLORURINE STRAW (A) 03/13/2017 1953   APPEARANCEUR HAZY (A) 03/13/2017 1953   APPEARANCEUR Hazy 07/13/2011 0029   LABSPEC 1.031 (H) 03/13/2017 1953   LABSPEC 1.023 07/13/2011 0029   PHURINE 6.0 03/13/2017 1953   GLUCOSEU >=500 (A) 03/13/2017 1953   GLUCOSEU Negative 07/13/2011 0029   HGBUR SMALL (A) 03/13/2017 1953   HGBUR negative 10/16/2007 1450   BILIRUBINUR NEGATIVE 03/13/2017 1953   BILIRUBINUR Negative 07/13/2011 0029   KETONESUR NEGATIVE 03/13/2017 1953   PROTEINUR NEGATIVE 03/13/2017 1953   UROBILINOGEN 1.0 07/09/2014 1915   NITRITE NEGATIVE 03/13/2017 1953   LEUKOCYTESUR MODERATE (A) 03/13/2017 1953   LEUKOCYTESUR Trace 07/13/2011 0029    Radiological Exams on Admission: Ct Abdomen Pelvis W Contrast  Result Date: 03/13/2017 CLINICAL DATA:  Right lower quadrant abdominal pain. EXAM: CT ABDOMEN AND PELVIS WITH CONTRAST TECHNIQUE: Multidetector CT imaging of the abdomen and pelvis was performed using the standard protocol following bolus administration of intravenous contrast. CONTRAST:  184mL ISOVUE-300 IOPAMIDOL (ISOVUE-300) INJECTION 61% COMPARISON:  Multiple prior exams most recently 02/17/2017 FINDINGS: Lower chest: The lung bases are clear. Hepatobiliary: Chronic marked hepatomegaly. Chronic advanced hepatic steatosis. More confluent hypodensity in the anterior liver, intermittently present on prior exams consistent with more focal fatty deposition. Clips in the gallbladder fossa postcholecystectomy. No biliary dilatation. Pancreas: No ductal dilatation or inflammation. Spleen: Chronic splenomegaly. Craniocaudal length 22 cm. No focal abnormality. Adrenals/Urinary Tract:  Normal adrenal glands. No hydronephrosis or perinephric edema. Homogeneous renal enhancement. Urinary bladder is physiologically distended without wall thickening. Stomach/Bowel: Stomach distended with ingested contents. No bowel obstruction, inflammation or wall thickening. Normal appendix. Vascular/Lymphatic: Recannulized umbilical vein. Chronic abdominal wall collaterals. Portal vein is patent. No acute vascular finding. No enlarged abdominal or pelvic lymph nodes. Reproductive: Uterus and bilateral adnexa are unremarkable. Other: Postsurgical change of the anterior abdominal wall. No ascites or free air. No  intra-abdominal abscess. Musculoskeletal: There are no acute or suspicious osseous abnormalities. IMPRESSION: 1. Severe hepatosplenomegaly and hepatic steatosis, chronic and unchanged. More confluent focal fatty infiltration throughout the anterior liver. 2. No acute finding. Electronically Signed   By: Jeb Levering M.D.   On: 03/13/2017 23:23   EKG: None   Assessment/Plan Principal Problem:   DKA (diabetic ketoacidoses) (H[facility]) Active Problems:   Obesity, unspecified   Depression   Vision loss of right eye   HTN (hypertension)   Schizoaffective disorder, bipolar type (H[facility])   Seizures ([facility])  DKA- Anion gap- 17, Bicarb- 17, blood glucose- 607. Reports daily compliance with insulins- both novolog 22u with meals and levemir 68u BID.  Possibly 2/2 UTI- initially reported dysuria to me, UA-moderate leukocytes, rare bacteria. Also having thick white itchy vaginal discharge, without odour X3 wks, Ct abd- non acute. Last Hgba1c on file 10/2016- 10. - Cont insulin drip and DKA protocol - Change IVF Ns to Ns +20KCL - IV ceftriazone 1 g daily for possible UTI - BMP stat and a.m  Presumed vaginal candidiasis- completed a course of metronidazole and Cipro for BV, prescribed 3 weeks ago. -Diflucan 150 x 1 now, repeat X1 in 72 hrs, uncontrolled diabetes  Schizoaffective disorder, BPD- significant  psych hx. - Cont home seroquel and setraline  Seizure dsd- last seizure Nov/2018.  - Cont home keppra, topirate. - seizure precautions   DVT prophylaxis: Lovenox  Code Status: Full  Family Communication: None physically at bedside, but Mother via Video call on laptop. Disposition Plan: 1-2 days Consults called: None  Admission status: inpt, step down    Bethena Roys MD [facility] Pager [phone] From 6PM-2AM.  Otherwise please contact night-coverage [email or web address] Password [email or web address]  03/14/2017, 12:06 AM

## 2017-03-14 NOTE — ED Notes (Signed)
Pt given breakfast tray

## 2017-03-14 NOTE — ED Notes (Signed)
Patient complaining of pain, MD paged

## 2017-03-15 ENCOUNTER — Other Ambulatory Visit: Payer: Self-pay

## 2017-03-15 DIAGNOSIS — B951 Streptococcus, group B, as the cause of diseases classified elsewhere: Secondary | ICD-10-CM

## 2017-03-15 DIAGNOSIS — E1110 Type 2 diabetes mellitus with ketoacidosis without coma: Principal | ICD-10-CM

## 2017-03-15 DIAGNOSIS — B373 Candidiasis of vulva and vagina: Secondary | ICD-10-CM | POA: Diagnosis not present

## 2017-03-15 DIAGNOSIS — R162 Hepatomegaly with splenomegaly, not elsewhere classified: Secondary | ICD-10-CM | POA: Diagnosis not present

## 2017-03-15 DIAGNOSIS — K760 Fatty (change of) liver, not elsewhere classified: Secondary | ICD-10-CM | POA: Diagnosis not present

## 2017-03-15 DIAGNOSIS — Z794 Long term (current) use of insulin: Secondary | ICD-10-CM

## 2017-03-15 DIAGNOSIS — F250 Schizoaffective disorder, bipolar type: Secondary | ICD-10-CM | POA: Diagnosis not present

## 2017-03-15 DIAGNOSIS — E669 Obesity, unspecified: Secondary | ICD-10-CM

## 2017-03-15 DIAGNOSIS — N390 Urinary tract infection, site not specified: Secondary | ICD-10-CM | POA: Diagnosis not present

## 2017-03-15 DIAGNOSIS — Z6841 Body Mass Index (BMI) 40.0 and over, adult: Secondary | ICD-10-CM | POA: Diagnosis not present

## 2017-03-15 LAB — GLUCOSE, CAPILLARY
GLUCOSE-CAPILLARY: 244 mg/dL — AB (ref 65–99)
GLUCOSE-CAPILLARY: 279 mg/dL — AB (ref 65–99)
Glucose-Capillary: 328 mg/dL — ABNORMAL HIGH (ref 65–99)
Glucose-Capillary: 303 mg/dL — ABNORMAL HIGH (ref 65–99)

## 2017-03-15 LAB — COMPREHENSIVE METABOLIC PANEL
ALK PHOS: 82 U/L (ref 38–126)
ALT: 19 U/L (ref 14–54)
ANION GAP: 9 (ref 5–15)
AST: 37 U/L (ref 15–41)
Albumin: 3.1 g/dL — ABNORMAL LOW (ref 3.5–5.0)
BILIRUBIN TOTAL: 0.6 mg/dL (ref 0.3–1.2)
BUN: <5 mg/dL — ABNORMAL LOW (ref 6–20)
CALCIUM: 8.1 mg/dL — AB (ref 8.9–10.3)
CO2: 20 mmol/L — AB (ref 22–32)
CREATININE: 0.47 mg/dL (ref 0.44–1.00)
Chloride: 107 mmol/L (ref 101–111)
Glucose, Bld: 284 mg/dL — ABNORMAL HIGH (ref 65–99)
Potassium: 3.9 mmol/L (ref 3.5–5.1)
Sodium: 136 mmol/L (ref 135–145)
TOTAL PROTEIN: 6.3 g/dL — AB (ref 6.5–8.1)

## 2017-03-15 LAB — HEMOGLOBIN A1C
HEMOGLOBIN A1C: 12.1 % — AB (ref 4.8–5.6)
MEAN PLASMA GLUCOSE: 300.57 mg/dL

## 2017-03-15 LAB — URINE CULTURE

## 2017-03-15 MED ORDER — INSULIN ASPART 100 UNIT/ML ~~LOC~~ SOLN
8.0000 [IU] | Freq: Three times a day (TID) | SUBCUTANEOUS | Status: DC
  Administered 2017-03-16 (×2): 8 [IU] via SUBCUTANEOUS

## 2017-03-15 MED ORDER — DOXYCYCLINE HYCLATE 100 MG PO TABS
100.0000 mg | ORAL_TABLET | Freq: Two times a day (BID) | ORAL | Status: DC
  Administered 2017-03-15 – 2017-03-16 (×2): 100 mg via ORAL
  Filled 2017-03-15 (×2): qty 1

## 2017-03-15 NOTE — Progress Notes (Signed)
Inpatient Diabetes Program Recommendations  AACE/ADA: New Consensus Statement on Inpatient Glycemic Control (2015)  Target Ranges:  Prepandial:   less than 140 mg/dL      Peak postprandial:   less than 180 mg/dL (1-2 hours)      Critically ill patients:  140 - 180 mg/dL   Lab Results  Component Value Date   GLUCAP 244 (H) 03/15/2017   HGBA1C 12.1 (H) 03/15/2017    Review of Glycemic Control  Diabetes history: DM2 Outpatient Diabetes medications: Levemir 68 units bid, Novolog 22 units tidwc, Trulicity 1.5 mg Q Fridays Current orders for Inpatient glycemic control: Levemir 50 units bid, Novolog 0-20 units tidwc and hs + 6 units tidwc  Hgba1C - 12.1%.  Sees MD on [facility] Dr. Denies any hypoglycemia States blood sugars usually run near 500. Pt states she does not miss her insulin, but occasionally misses checking her blood sugars. Pt states she takes Levemir 68 units bid instead of 50 units bid. Also takes Novolog 22 units tidwc at home. States she drinks a lot of soda, sweet tea, and sweets. Was not interested in talking at this time. Pt was whispering, then closed her eyes. Will try to speak with her when she is transferred to floor.   Inpatient Diabetes Program Recommendations:    Agree with Levemir 50 units bid May consider increasing Novolog to 8 units tidwc if pt eats > 50% meal.  Will continue to follow.  Thank you. Lorenda Peck, RD, LDN, CDE Inpatient Diabetes Coordinator [phone]

## 2017-03-15 NOTE — Progress Notes (Signed)
PROGRESS NOTE  DRISHTI PEPPERMAN XIP:[id number] DOB: 05/31/1985 DOA: 03/13/2017 PCP: Javier Docker, MD  HPI/Recap of past 24 hours:  Feeling better , off insulin drip , denies pain, no fever  Assessment/Plan: Principal Problem:   DKA (diabetic ketoacidoses) ([place]) Active Problems:   Obesity, unspecified   Depression   Vision loss of right eye   HTN (hypertension)   Schizoaffective disorder, bipolar type ([place])   Seizures ([place])  DKA She is admitted to icu/stepdwon on insulin drip Gap closed , transitioned to subQ insulin  UTI: She received Rocephin on admission, urine culture 30,000 colonies of group B strep.  Will treat with doxycycline  Insulin dependent dm2, a1c 12 Suspect diet and medication noncompliance Continue disease and diet education Continue adjust insulin  lft elevated lft Has normalized hepatitis panel in process  Right lower quadrant pain, on exam ab soft, ct ab /pel no acute findings Continue home meds prn bentyl Denies pain today  Morbid obesity: Body mass index is 40.79 kg/m. Advised weight loss  Presumed vaginal candidiasis- completed a course of metronidazole and Cipro for BV, prescribed 3 weeks ago. -Diflucan 150 x 1 now, repeat X1 in 72 hrs, uncontrolled diabetes  Schizoaffective disorder, BPD- significant psych hx. - Cont home seroquel and setraline  Seizure dsd- last seizure Nov/2018.  - Cont home keppra, topirate. - seizure precautions   DVT prophylaxis: Lovenox  Code Status: Full  Family Communication: None physically at bedside, but Mother via Video call on laptop. Disposition Plan:  Home tomorrow   Consultants:  Diabetes are not  Procedures:  none  Antibiotics:  Rocephin x1  Doxycycline   Objective: BP 122/70 (BP Location: Right Arm)   Pulse 77   Temp 98.4 F (36.9 C) (Oral)   Resp 16   Ht 5\' 4"  (1.626 m)   Wt 107.8 kg (237 lb 10.5 oz)   LMP 01/25/2017 Comment: neg hCG  SpO2 98%   BMI 40.79 kg/m    Intake/Output Summary (Last 24 hours) at 03/15/2017 1914 Last data filed at 03/15/2017 1838 Gross per 24 hour  Intake 2803.75 ml  Output 425 ml  Net 2378.75 ml   Filed Weights   03/14/17 1252 03/14/17 1948 03/15/17 1627  Weight: 105.9 kg (233 lb 7.5 oz) (P) 105.9 kg (233 lb 7.5 oz) 107.8 kg (237 lb 10.5 oz)    Exam: Patient is examined daily including today on 03/15/2017, exams remain the same as of yesterday except that has changed    General:  NAD, obese  Cardiovascular: RRR  Respiratory: CTABL  Abdomen: Soft/ND/NT, positive BS  Musculoskeletal: No Edema  Neuro: alert, oriented   Data Reviewed: Basic Metabolic Panel: Recent Labs  Lab 03/13/17 1950 03/14/17 0450 03/14/17 1440 03/15/17 0308  NA 128* 134* 133* 136  K 3.7 3.5 4.1 3.9  CL 94* 100* 104 107  CO2 17* 22 21* 20*  GLUCOSE 607* 248* 353* 284*  BUN <5* <5* <5* <5*  CREATININE 0.71 0.46 0.45 0.47  CALCIUM 8.9 8.4* 8.0* 8.1*   Liver Function Tests: Recent Labs  Lab 03/13/17 1950 03/15/17 0308  AST 48* 37  ALT 24 19  ALKPHOS 122 82  BILITOT 0.8 0.6  PROT 8.1 6.3*  ALBUMIN 4.1 3.1*   Recent Labs  Lab 03/13/17 1950  LIPASE 32   No results for input(s): AMMONIA in the last 168 hours. CBC: Recent Labs  Lab 03/13/17 1950  WBC 4.3  HGB 11.9*  HCT 35.8*  MCV 78.9  PLT 122*  Cardiac Enzymes:   No results for input(s): CKTOTAL, CKMB, CKMBINDEX, TROPONINI in the last 168 hours. BNP (last 3 results) No results for input(s): BNP in the last 8760 hours.  ProBNP (last 3 results) No results for input(s): PROBNP in the last 8760 hours.  CBG: Recent Labs  Lab 03/14/17 1721 03/14/17 2127 03/15/17 0745 03/15/17 1208 03/15/17 1637  GLUCAP 234* 183* 244* 279* 328*    Recent Results (from the past 240 hour(s))  Culture, Urine     Status: Abnormal   Collection Time: 03/13/17  7:53 PM  Result Value Ref Range Status   Specimen Description   Final    URINE, CLEAN CATCH Performed at [facility], [place] [address], [place], [place] [address]    Special Requests   Final    NONE Performed at [facility], [place] [address], [place], [place] [address]    Culture (A)  Final    30,000 COLONIES/mL GROUP B STREP(S.AGALACTIAE)ISOLATED WITH  MIXED BACTERIAL ORGANISMS TESTING AGAINST S. AGALACTIAE NOT ROUTINELY PERFORMED DUE TO PREDICTABILITY OF AMP/PEN/VAN SUSCEPTIBILITY. Performed at [facility], [place] [address] [place], [place] [address]    Report Status 03/15/2017 FINAL  Final  MRSA PCR Screening     Status: Abnormal   Collection Time: 03/14/17  1:38 PM  Result Value Ref Range Status   MRSA by PCR POSITIVE (A) NEGATIVE Final    Comment:        The GeneXpert MRSA Assay (FDA approved for NASAL specimens only), is one component of a comprehensive MRSA colonization surveillance program. It is not intended to diagnose MRSA infection nor to guide or monitor treatment for MRSA infections. RESULT CALLED TO, READ BACK BY AND VERIFIED WITH: Oretha Milch [id number] @ [address] Performed at [place] [address] [place],  [address]      Studies: No results found.  Scheduled Meds: . busPIRone  15 mg Oral TID  . Chlorhexidine Gluconate Cloth  6 each Topical Q0600  . chlorproMAZINE  50 mg Oral TID  . cloNIDine  0.1 mg Oral BID  . doxycycline  100 mg Oral Q12H  . enoxaparin (LOVENOX) injection  40 mg Subcutaneous Q24H  . insulin aspart  0-20 Units Subcutaneous TID WC  . insulin aspart  0-5 Units Subcutaneous QHS  . [START ON 03/16/2017] insulin aspart  8 Units Subcutaneous TID WC  . insulin detemir  50 Units Subcutaneous BID  . levETIRAcetam  500 mg Oral BID  . lisinopril  10 mg Oral Daily  . metoCLOPramide (REGLAN) injection  5 mg Intravenous Q8H  . mupirocin ointment  1 application Nasal BID  . pantoprazole  40 mg Oral Daily  . QUEtiapine  50 mg Oral Daily  . QUEtiapine  500 mg Oral QHS  .  sertraline  200 mg Oral Daily  . topiramate  100 mg Oral BID    Continuous Infusions:    Time spent: 25 mins  I have personally reviewed and interpreted on  03/15/2017 daily labs, tele strips, imagings as discussed above under date review session and assessment and plans.  I reviewed all nursing notes, pharmacy notes,   vitals, pertinent old records  I have discussed plan of care as described above with RN , patient and family on 03/15/2017   Florencia Reasons MD, PhD  [facility] Pager [phone]. If 7PM-7AM, please contact night-coverage at [email or web address], password [facility] 03/15/2017, 7:14 PM  LOS: 1 day

## 2017-03-16 DIAGNOSIS — B373 Candidiasis of vulva and vagina: Secondary | ICD-10-CM

## 2017-03-16 DIAGNOSIS — Z6841 Body Mass Index (BMI) 40.0 and over, adult: Secondary | ICD-10-CM

## 2017-03-16 DIAGNOSIS — E119 Type 2 diabetes mellitus without complications: Secondary | ICD-10-CM

## 2017-03-16 LAB — CBC WITH DIFFERENTIAL/PLATELET
BASOS PCT: 0 %
Basophils Absolute: 0 10*3/uL (ref 0.0–0.1)
EOS ABS: 0.1 10*3/uL (ref 0.0–0.7)
Eosinophils Relative: 3 %
HEMATOCRIT: 31.5 % — AB (ref 36.0–46.0)
Hemoglobin: 10.1 g/dL — ABNORMAL LOW (ref 12.0–15.0)
LYMPHS ABS: 1.4 10*3/uL (ref 0.7–4.0)
Lymphocytes Relative: 38 %
MCH: 26.2 pg (ref 26.0–34.0)
MCHC: 32.1 g/dL (ref 30.0–36.0)
MCV: 81.6 fL (ref 78.0–100.0)
MONO ABS: 0.3 10*3/uL (ref 0.1–1.0)
MONOS PCT: 7 %
NEUTROS ABS: 1.9 10*3/uL (ref 1.7–7.7)
Neutrophils Relative %: 52 %
Platelets: 104 10*3/uL — ABNORMAL LOW (ref 150–400)
RBC: 3.86 MIL/uL — ABNORMAL LOW (ref 3.87–5.11)
RDW: 16.4 % — AB (ref 11.5–15.5)
WBC: 3.6 10*3/uL — ABNORMAL LOW (ref 4.0–10.5)

## 2017-03-16 LAB — BASIC METABOLIC PANEL
Anion gap: 10 (ref 5–15)
BUN: 11 mg/dL (ref 6–20)
CALCIUM: 8.6 mg/dL — AB (ref 8.9–10.3)
CO2: 18 mmol/L — AB (ref 22–32)
Chloride: 107 mmol/L (ref 101–111)
Creatinine, Ser: 0.54 mg/dL (ref 0.44–1.00)
GFR calc non Af Amer: >60 mL/min (ref >60)
Glucose, Bld: 328 mg/dL — ABNORMAL HIGH (ref 65–99)
Potassium: 4 mmol/L (ref 3.5–5.1)
Sodium: 135 mmol/L (ref 135–145)

## 2017-03-16 LAB — HEPATITIS PANEL, ACUTE
HEP A IGM: NEGATIVE
Hep B C IgM: NEGATIVE
Hepatitis B Surface Ag: NEGATIVE

## 2017-03-16 LAB — GLUCOSE, CAPILLARY
GLUCOSE-CAPILLARY: 304 mg/dL — AB (ref 65–99)
GLUCOSE-CAPILLARY: 368 mg/dL — AB (ref 65–99)

## 2017-03-16 LAB — MAGNESIUM: Magnesium: 1.6 mg/dL — ABNORMAL LOW (ref 1.7–2.4)

## 2017-03-16 MED ORDER — DOXYCYCLINE HYCLATE 100 MG PO TABS: 100.0000 mg | ORAL_TABLET | Freq: Two times a day (BID) | ORAL | 0 refills | Status: AC

## 2017-03-16 MED ORDER — CYCLOBENZAPRINE HCL 5 MG PO TABS: 5.0000 mg | ORAL_TABLET | Freq: Two times a day (BID) | ORAL | 0 refills | Status: DC | PRN

## 2017-03-16 MED ORDER — SODIUM BICARBONATE 650 MG PO TABS: 650.0000 mg | ORAL_TABLET | Freq: Every day | ORAL | 0 refills | Status: DC

## 2017-03-16 MED ORDER — SODIUM BICARBONATE 650 MG PO TABS
650.0000 mg | ORAL_TABLET | Freq: Every day | ORAL | Status: DC
  Administered 2017-03-16: 650 mg via ORAL
  Filled 2017-03-16: qty 1

## 2017-03-16 MED ORDER — INSULIN DETEMIR 100 UNIT/ML ~~LOC~~ SOLN: 68.0000 [IU] | Freq: Two times a day (BID) | SUBCUTANEOUS | 0 refills | Status: DC

## 2017-03-16 MED ORDER — MAGNESIUM SULFATE 2 GM/50ML IV SOLN
2.0000 g | Freq: Once | INTRAVENOUS | Status: AC
  Administered 2017-03-16: 2 g via INTRAVENOUS
  Filled 2017-03-16: qty 50

## 2017-03-16 MED ORDER — MAGNESIUM OXIDE 400 MG PO TABS: 400.0000 mg | ORAL_TABLET | Freq: Every day | ORAL | 0 refills | Status: DC

## 2017-03-16 NOTE — Care Management Note (Signed)
Case Management Note  Patient Details  Name: Tina Saunders MRN: [id number] Date of Birth: [birth year]  Subjective/Objective: No CM needs.                   Action/Plan:d/c home.   Expected Discharge Date:  03/16/17               Expected Discharge Plan:  Home/Self Care  In-House Referral:     Discharge planning Services  CM Consult  Post Acute Care Choice:    Choice offered to:     DME Arranged:    DME Agency:     HH Arranged:    HH Agency:     Status of Service:  Completed, signed off  If discussed at [employer] of Stay Meetings, dates discussed:    Additional Comments:  Dessa Phi, RN 03/16/2017, 10:33 AM

## 2017-03-16 NOTE — Discharge Summary (Signed)
Discharge Summary  Tina Saunders ZOX:[id number] DOB: 02-25-1985  PCP: Javier Docker, MD  Admit date: 03/13/2017 Discharge date: 03/16/2017  Time spent: <70mins  Recommendations for Outpatient Follow-up:  1. F/u with PMD within a week  for hospital discharge follow up, repeat cbc/bmp at follow up  Discharge Diagnoses:  Active Hospital Problems   Diagnosis Date Noted  . DKA (diabetic ketoacidoses) ([place]) 03/14/2017  . Seizures ([place]) 12/11/2016  . Schizoaffective disorder, bipolar type ([place]) 11/02/2016  . 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 HTN (hypertension) 04/15/2013  . Obesity, unspecified 07/30/2007  . Depression 07/30/2007    Resolved Hospital Problems  No resolved problems to display.    Discharge Condition: stable  Diet recommendation: heart healthy/carb modified  Filed Weights   03/14/17 1252 03/14/17 1948 03/15/17 1627  Weight: 105.9 kg (233 lb 7.5 oz) 105.9 kg (233 lb 7.5 oz) 107.8 kg (237 lb 10.5 oz)    History of present illness:   PCP: Javier Docker, MD   Patient coming from: Home  Chief Complaint: Abdominal pain  HPI: Tina Saunders is a 32 y.o. female with medical history significant for DM, Schizo- affective disorder, PSVT, Depression, Seizures, who presented to the ED today with complaints of right lower quadrant pain with nausea and one-time vomiting that started today.  Patient also reported that she felt like her blood sugar was high, she has not been able to check her blood sugars because she ran out of strips.  But she reports compliance with her insulin daily. Patient initially reported dysuria to me but later denied it, reports whitish vaginal discharge itchy non-foul-smelling of 3 weeks duration. She denies headaches or neck stiffness, no shortness of breath or cough no myalgias no fevers or chills.  ED Course: Pulse 811B, Bo systolic 147W. WBC- 4.3. Blood glucose- 607. Anion gap- 17, bicarb- 17. UA- mod leuks, rare bact. CT  abdomen pelvis W contrast - severe hepatosplenomegaly with hepatic steatosis- chronic and unchanged, fatty liver. Pt was started on DKA protocol.    Hospital Course:  Principal Problem:   DKA (diabetic ketoacidoses) ([facility]) Active Problems:   Obesity, unspecified   Depression   Vision loss of right eye   HTN (hypertension)   Schizoaffective disorder, bipolar type ([place])   Seizures ([place])   DKA She is admitted to icu/stepdwon on insulin drip Gap closed , transitioned to subQ insulin  UTI: She received Rocephin on admission, urine culture 30,000 colonies of group B strep.  Will treat with doxycycline.  Hypomagnesemia: replace mag.  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 pmd to continue monitor labs and adjust meds accordingly.  lft elevated lft Has normalized hepatitis panel negative  Right lower quadrant pain, on exam ab soft, ct ab /pel no acute findings (Per chart review , patient has a Recent colonosocopy done on 2/14 for intermittent ab pain, alternating constipation and diarrhea, colonosocopy was unremarkable) -Continue home meds prn bentyl -Denies pain today  Morbid obesity: Body mass index is 40.79 kg/m. Advised weight loss  Presumed vaginal candidiasis-completed a course of metronidazole and Cipro for BV,prescribed 3 weeks ago. -Diflucan 150 x 1now, repeat [id number] hrs,uncontrolled diabetes Symptom resolved  Schizoaffective  disorder, BPD-significant psych hx. - Cont home seroquel and setraline  Seizure dsd-last seizure Nov/2018.  - Cont home keppra, topirate. - seizure precautions   DVT prophylaxis  while in the hospital:Lovenox Code Status:Full Family Communication:None physically at bedside, but Mother via Video call on laptop. Disposition Plan: Home    Consultants:  Diabetes are not  Procedures:  none  Antibiotics:  Rocephin x1  Doxycycline   Discharge Exam: BP (!) 105/52 (BP Location: Right Arm)   Pulse 72   Temp 97.8 F (36.6 C) (Oral)   Resp 15   Ht 5\' 4"  (1.626 m)   Wt 107.8 kg (237 lb 10.5 oz)   LMP 01/25/2017 Comment: neg hCG  SpO2 97%   BMI 40.79 kg/m   General: NAD, obese Cardiovascular: RRR Respiratory: CTABL  Discharge Instructions You were cared for by a hospitalist during your hospital stay. If you have any questions about your discharge medications or the care you received while you were in the hospital after you are discharged, you can call the unit and asked to speak with the hospitalist on call if the hospitalist that took care of you is not available. Once you are discharged, your primary care physician will handle any further medical issues. Please note that NO REFILLS for any discharge medications will be authorized once you are discharged, as it is imperative that you return to your primary care physician (or establish a relationship with a primary care physician if you do not have one) for your aftercare needs so that they can reassess your need for medications and monitor your lab values.  Discharge Instructions    Diet - low sodium heart healthy   Complete by:  As directed    Carb modified diet   Increase activity slowly   Complete by:  As directed      Allergies as of 03/16/2017      Reactions   Fish-derived Products Anaphylaxis   Can only eat FLounder   Geodon [ziprasidone Hcl] Other (See Comments)   Face pulls, cant swallow - Locked Jaw   Haldol [haloperidol Lactate] Other (See Comments)   Face pulls, can't swallow - Locked Jaw   Buprenorphine Hcl Hives, Itching, Rash, Other (See Comments)   GI upset   Compazine  [prochlorperazine] Other (See Comments)   anxiety and hyperactivity   Morphine And Related Hives, Itching, Rash, Other (See Comments)   GI upset   Toradol [ketorolac Tromethamine] Other (See Comments)   Anxiety and hyperactivity      Medication List    STOP taking these medications   lactulose 10 GM/15ML solution Commonly known as:  CHRONULAC   metroNIDAZOLE 500 MG tablet Commonly known as:  FLAGYL   traZODone 50 MG tablet Commonly known as:  DESYREL     TAKE these medications   busPIRone 15 MG tablet Commonly known as:  BUSPAR Take 15 mg by mouth 3 (three) times daily.   chlorproMAZINE 50 MG tablet Commonly known as:  THORAZINE Take 1 tablet (50 mg total) by mouth 3 (three) times daily. For agitation/mood control   cloNIDine 0.1 MG tablet Commonly known as:  CATAPRES Take 1 tablet (0.1 mg total) by mouth 2 (two) times daily. For high blood pressure   cyclobenzaprine 5 MG tablet Commonly known as:  FLEXERIL Take 1 tablet (5 mg total) by mouth 2 (two) times daily as needed for muscle spasms. What changed:    when to take this  reasons to take this   dicyclomine 20 MG  tablet Commonly known as:  BENTYL Take 1 tablet (20 mg total) by mouth every 8 (eight) hours as needed for spasms.   doxycycline 100 MG tablet Commonly known as:  VIBRA-TABS Take 1 tablet (100 mg total) by mouth every 12 (twelve) hours for 4 days.   Dulaglutide 1.5 MG/0.5ML Sopn Commonly known as:  TRULICITY Inject 1.5 mg into the skin every Friday. For diabetes management   fenofibrate 160 MG tablet Take 1 tablet (160 mg total) by mouth daily. For high Cholesterol   gabapentin 400 MG capsule Commonly known as:  NEURONTIN Take 1 capsule (400 mg total) by mouth 3 (three) times daily. For agitation/diabetic neuropathy   hydrOXYzine 50 MG tablet Commonly known as:  ATARAX/VISTARIL Take 1 tablet (50 mg total) by mouth 3 (three) times daily as needed for anxiety.   ibuprofen 800 MG  tablet Commonly known as:  ADVIL,MOTRIN Take 800 mg by mouth every 6 (six) hours as needed (pain).   insulin aspart 100 UNIT/ML injection Commonly known as:  novoLOG Inject 6 Units into the skin 3 (three) times daily with meals. For diabetes management   insulin detemir 100 UNIT/ML injection Commonly known as:  LEVEMIR Inject 0.68 mLs (68 Units total) into the skin 2 (two) times daily. For diabetes management What changed:  how much to take   levETIRAcetam 500 MG tablet Commonly known as:  KEPPRA Take 1 tablet (500 mg total) by mouth 2 (two) times daily. For mood stabilization   lisinopril 10 MG tablet Commonly known as:  PRINIVIL,ZESTRIL Take 1 tablet (10 mg total) by mouth daily. For high blood pressure   magnesium oxide 400 MG tablet Commonly known as:  MAG-OX Take 1 tablet (400 mg total) by mouth daily.   pantoprazole 40 MG tablet Commonly known as:  PROTONIX Take 1 tablet (40 mg total) by mouth daily. For acid reflux   QUEtiapine 50 MG tablet Commonly known as:  SEROQUEL Take 1 tablet (50 mg total) by mouth 2 (two) times daily. For agitation/mood control   QUEtiapine 100 MG tablet Commonly known as:  SEROQUEL Take 5 tablets (500 mg total) by mouth at bedtime. For mood control   sertraline 100 MG tablet Commonly known as:  ZOLOFT Take 200 mg by mouth daily.   sodium bicarbonate 650 MG tablet Take 1 tablet (650 mg total) by mouth daily.   topiramate 100 MG tablet Commonly known as:  TOPAMAX Take 100 mg by mouth 2 (two) times daily.      Allergies  Allergen Reactions  . Fish-Derived Products Anaphylaxis    Can only eat FLounder  . Geodon [Ziprasidone Hcl] Other (See Comments)    Face pulls, cant swallow - Locked Jaw  . Haldol [Haloperidol Lactate] Other (See Comments)    Face pulls, can't swallow - Locked Jaw  . Buprenorphine Hcl Hives, Itching, Rash and Other (See Comments)    GI upset  . Compazine [Prochlorperazine] Other (See Comments)    anxiety and  hyperactivity  . Morphine And Related Hives, Itching, Rash and Other (See Comments)    GI upset  . Toradol [Ketorolac Tromethamine] Other (See Comments)    Anxiety and hyperactivity   Follow-up Information    Pavelock, Ralene Bathe, MD Follow up in 1 week(s).   Specialty:  Internal Medicine Why:  hospital discharge follow up Contact information: [address] [place] [address] [phone]            The results of significant diagnostics from this  hospitalization (including imaging, microbiology, ancillary and laboratory) are listed below for reference.    Significant Diagnostic Studies: US Pelvis Complete  Result Date: 02/20/2017 CLINICAL DATA:  Right pelvic tenderness for 4 days. EXAM: TRANSABDOMINAL ULTRASOUND OF PELVIS DOPPLER ULTRASOUND OF OVARIES TECHNIQUE: Transabdominal ultrasound examination of the pelvis was performed including evaluation of the uterus, ovaries, adnexal regions, and pelvic cul-de-sac. Color and duplex Doppler ultrasound was utilized to evaluate blood flow to the ovaries. COMPARISON:  10/26/2016 FINDINGS: Uterus Measurements: 6.2 x 2.8 x 4.5 cm. No fibroids or other mass visualized. Endometrium Not well visualized Right ovary Not visualized Left ovary Not visualized No Doppler images were obtained as ovaries were not visualized. Examination is technically limited due to patient's body habitus and under distended bladder. Transvaginal imaging could not be performed as the patient is not sexually active. IMPRESSION: Limited examination. Uterus is visualized and is normal in size. Endometrium and ovaries were not seen. Electronically Signed   By: Lucienne Capers M.D.   On: 02/20/2017 02:52   Ct Abdomen Pelvis W Contrast  Result Date: 03/13/2017 CLINICAL DATA:  Right lower quadrant abdominal pain. EXAM: CT ABDOMEN AND PELVIS WITH CONTRAST TECHNIQUE: Multidetector CT imaging of the abdomen and pelvis was performed using the standard protocol  following bolus administration of intravenous contrast. CONTRAST:  141mL ISOVUE-300 IOPAMIDOL (ISOVUE-300) INJECTION 61% COMPARISON:  Multiple prior exams most recently 02/17/2017 FINDINGS: Lower chest: The lung bases are clear. Hepatobiliary: Chronic marked hepatomegaly. Chronic advanced hepatic steatosis. More confluent hypodensity in the anterior liver, intermittently present on prior exams consistent with more focal fatty deposition. Clips in the gallbladder fossa postcholecystectomy. No biliary dilatation. Pancreas: No ductal dilatation or inflammation. Spleen: Chronic splenomegaly. Craniocaudal length 22 cm. No focal abnormality. Adrenals/Urinary Tract: Normal adrenal glands. No hydronephrosis or perinephric edema. Homogeneous renal enhancement. Urinary bladder is physiologically distended without wall thickening. Stomach/Bowel: Stomach distended with ingested contents. No bowel obstruction, inflammation or wall thickening. Normal appendix. Vascular/Lymphatic: Recannulized umbilical vein. Chronic abdominal wall collaterals. Portal vein is patent. No acute vascular finding. No enlarged abdominal or pelvic lymph nodes. Reproductive: Uterus and bilateral adnexa are unremarkable. Other: Postsurgical change of the anterior abdominal wall. No ascites or free air. No intra-abdominal abscess. Musculoskeletal: There are no acute or suspicious osseous abnormalities. IMPRESSION: 1. Severe hepatosplenomegaly and hepatic steatosis, chronic and unchanged. More confluent focal fatty infiltration throughout the anterior liver. 2. No acute finding. Electronically Signed   By: Jeb Levering M.D.   On: 03/13/2017 23:23   Ct Abdomen Pelvis W Contrast  Result Date: 02/17/2017 CLINICAL DATA:  Nausea.  Hyperglycemia. EXAM: CT ABDOMEN AND PELVIS WITH CONTRAST TECHNIQUE: Multidetector CT imaging of the abdomen and pelvis was performed using the standard protocol following bolus administration of intravenous contrast. CONTRAST:   150mL ISOVUE-300 IOPAMIDOL (ISOVUE-300) INJECTION 61% COMPARISON:  02/03/2017 FINDINGS: Lower chest: No acute abnormality. Hepatobiliary: Stable hepatomegaly with striking geographic fat deposition in both right and left lobes as well as diffuse pattern of steatosis. No overt cirrhosis or hepatic masses identified. The gallbladder has been removed. No biliary dilatation. Pancreas: Unremarkable. No pancreatic ductal dilatation or surrounding inflammatory changes. Spleen: Stable moderate splenomegaly. Adrenals/Urinary Tract: Adrenal glands are unremarkable. Kidneys are normal, without renal calculi, focal lesion, or hydronephrosis. Bladder is unremarkable. Stomach/Bowel: Bowel shows no evidence of obstruction, inflammation or ileus. No free air. Vascular/Lymphatic: No significant vascular findings are present. No enlarged abdominal or pelvic lymph nodes. Reproductive: Uterus and bilateral adnexa are unremarkable. Other: No abdominal wall hernia or abnormality. No abdominopelvic  ascites. Musculoskeletal: No acute or significant osseous findings. IMPRESSION: No acute findings. Stable hepatosplenomegaly and striking steatosis of the liver. Electronically Signed   By: Aletta Edouard M.D.   On: 02/17/2017 14:02   [place] Art/ven Flow Abd Pelv Doppler  Result Date: 02/20/2017 CLINICAL DATA:  Right pelvic tenderness for 4 days. EXAM: TRANSABDOMINAL ULTRASOUND OF PELVIS DOPPLER ULTRASOUND OF OVARIES TECHNIQUE: Transabdominal ultrasound examination of the pelvis was performed including evaluation of the uterus, ovaries, adnexal regions, and pelvic cul-de-sac. Color and duplex Doppler ultrasound was utilized to evaluate blood flow to the ovaries. COMPARISON:  10/26/2016 FINDINGS: Uterus Measurements: 6.2 x 2.8 x 4.5 cm. No fibroids or other mass visualized. Endometrium Not well visualized Right ovary Not visualized Left ovary Not visualized No Doppler images were obtained as ovaries were not visualized. Examination is  technically limited due to patient's body habitus and under distended bladder. Transvaginal imaging could not be performed as the patient is not sexually active. IMPRESSION: Limited examination. Uterus is visualized and is normal in size. Endometrium and ovaries were not seen. Electronically Signed   By: Lucienne Capers M.D.   On: 02/20/2017 02:52    Microbiology: Recent Results (from the past 240 hour(s))  Culture, Urine     Status: Abnormal   Collection Time: 03/13/17  7:53 PM  Result Value Ref Range Status   Specimen Description   Final    URINE, CLEAN CATCH Performed at [facility], [place] [address], [place], [place] [address]    Special Requests   Final    NONE Performed at [facility], [place] [address] [place], [place] [address]    Culture (A)  Final    30,000 COLONIES/mL GROUP B STREP(S.AGALACTIAE)ISOLATED WITH  MIXED BACTERIAL ORGANISMS TESTING AGAINST S. AGALACTIAE NOT ROUTINELY PERFORMED DUE TO PREDICTABILITY OF AMP/PEN/VAN SUSCEPTIBILITY. Performed at [facility], [place] [address] [place], [place] [address]    Report Status 03/15/2017 FINAL  Final  MRSA PCR Screening     Status: Abnormal   Collection Time: 03/14/17  1:38 PM  Result Value Ref Range Status   MRSA by PCR POSITIVE (A) NEGATIVE Final    Comment:        The GeneXpert MRSA Assay (FDA approved for NASAL specimens only), is one component of a comprehensive MRSA colonization surveillance program. It is not intended to diagnose MRSA infection nor to guide or monitor treatment for MRSA infections. RESULT CALLED TO, READ BACK BY AND VERIFIED WITH: Oretha Milch [id number] @ [address] Performed at [facility] [address] [place], [place] [address]      Labs: Basic Metabolic Panel: Recent Labs  Lab 03/13/17 1950 03/14/17 0450 03/14/17 1440 03/15/17 0308 03/16/17 0455  NA 128* 134* 133* 136 135  K 3.7 3.5 4.1 3.9 4.0  CL 94*  100* 104 107 107  CO2 17* 22 21* 20* 18*  GLUCOSE 607* 248* 353* 284* 328*  BUN <5* <5* <5* <5* 11  CREATININE 0.71 0.46 0.45 0.47 0.54  CALCIUM 8.9 8.4* 8.0* 8.1* 8.6*  MG  --   --   --   --  1.6*   Liver Function Tests: Recent Labs  Lab 03/13/17 1950 03/15/17 0308  AST 48* 37  ALT 24 19  ALKPHOS 122 82  BILITOT 0.8 0.6  PROT 8.1 6.3*  ALBUMIN 4.1 3.1*   Recent Labs  Lab 03/13/17 1950  LIPASE 32   No results for input(s): AMMONIA in the last  168 hours. CBC: Recent Labs  Lab 03/13/17 1950 03/16/17 0455  WBC 4.3 3.6*  NEUTROABS  --  1.9  HGB 11.9* 10.1*  HCT 35.8* 31.5*  MCV 78.9 81.6  PLT 122* 104*   Cardiac Enzymes: No results for input(s): CKTOTAL, CKMB, CKMBINDEX, TROPONINI in the last 168 hours. BNP: BNP (last 3 results) No results for input(s): BNP in the last 8760 hours.  ProBNP (last 3 results) No results for input(s): PROBNP in the last 8760 hours.  CBG: Recent Labs  Lab 03/15/17 0745 03/15/17 1208 03/15/17 1637 03/15/17 2204 03/16/17 0742  GLUCAP 244* 279* 328* 303* 304*       Signed:  Florencia Reasons MD, PhD  [facility] 03/16/2017, 10:40 AM

## 2017-03-21 DIAGNOSIS — K7581 Nonalcoholic steatohepatitis (NASH): Secondary | ICD-10-CM | POA: Diagnosis not present

## 2017-03-21 DIAGNOSIS — E1165 Type 2 diabetes mellitus with hyperglycemia: Secondary | ICD-10-CM | POA: Diagnosis not present

## 2017-03-29 ENCOUNTER — Emergency Department (HOSPITAL_COMMUNITY)
Admission: EM | Admit: 2017-03-29 | Discharge: 2017-03-30 | Disposition: A | Discharge status: Home / Self Care | Payer: Medicare Other | Attending: Emergency Medicine | Admitting: Emergency Medicine

## 2017-03-29 ENCOUNTER — Encounter (HOSPITAL_COMMUNITY): Payer: Self-pay | Admitting: Emergency Medicine

## 2017-03-29 ENCOUNTER — Other Ambulatory Visit: Payer: Self-pay

## 2017-03-29 DIAGNOSIS — Z794 Long term (current) use of insulin: Secondary | ICD-10-CM | POA: Insufficient documentation

## 2017-03-29 DIAGNOSIS — R45851 Suicidal ideations: Secondary | ICD-10-CM | POA: Insufficient documentation

## 2017-03-29 DIAGNOSIS — Z79899 Other long term (current) drug therapy: Secondary | ICD-10-CM | POA: Insufficient documentation

## 2017-03-29 DIAGNOSIS — I10 Essential (primary) hypertension: Secondary | ICD-10-CM | POA: Insufficient documentation

## 2017-03-29 DIAGNOSIS — E119 Type 2 diabetes mellitus without complications: Secondary | ICD-10-CM | POA: Diagnosis not present

## 2017-03-29 DIAGNOSIS — J069 Acute upper respiratory infection, unspecified: Secondary | ICD-10-CM | POA: Insufficient documentation

## 2017-03-29 DIAGNOSIS — F419 Anxiety disorder, unspecified: Secondary | ICD-10-CM

## 2017-03-29 DIAGNOSIS — R739 Hyperglycemia, unspecified: Secondary | ICD-10-CM | POA: Insufficient documentation

## 2017-03-29 DIAGNOSIS — Z85028 Personal history of other malignant neoplasm of stomach: Secondary | ICD-10-CM | POA: Insufficient documentation

## 2017-03-29 DIAGNOSIS — F209 Schizophrenia, unspecified: Secondary | ICD-10-CM | POA: Insufficient documentation

## 2017-03-29 LAB — COMPREHENSIVE METABOLIC PANEL
ALBUMIN: 3.7 g/dL (ref 3.5–5.0)
ALT: 21 U/L (ref 14–54)
AST: 38 U/L (ref 15–41)
Alkaline Phosphatase: 113 U/L (ref 38–126)
Anion gap: 14 (ref 5–15)
CHLORIDE: 97 mmol/L — AB (ref 101–111)
CO2: 21 mmol/L — ABNORMAL LOW (ref 22–32)
CREATININE: 0.53 mg/dL (ref 0.44–1.00)
Calcium: 9 mg/dL (ref 8.9–10.3)
GFR calc Af Amer: >60 mL/min (ref >60)
GFR calc non Af Amer: >60 mL/min (ref >60)
Glucose, Bld: 437 mg/dL — ABNORMAL HIGH (ref 65–99)
Potassium: 3.8 mmol/L (ref 3.5–5.1)
Sodium: 132 mmol/L — ABNORMAL LOW (ref 135–145)
Total Bilirubin: 0.7 mg/dL (ref 0.3–1.2)
Total Protein: 7.3 g/dL (ref 6.5–8.1)

## 2017-03-29 LAB — RAPID URINE DRUG SCREEN, HOSP PERFORMED
AMPHETAMINES: NOT DETECTED
Barbiturates: NOT DETECTED
Benzodiazepines: NOT DETECTED
Cocaine: NOT DETECTED
OPIATES: NOT DETECTED
TETRAHYDROCANNABINOL: NOT DETECTED

## 2017-03-29 LAB — CBC
HCT: 35.9 % — ABNORMAL LOW (ref 36.0–46.0)
HEMOGLOBIN: 11.4 g/dL — AB (ref 12.0–15.0)
MCH: 25.4 pg — AB (ref 26.0–34.0)
MCHC: 31.8 g/dL (ref 30.0–36.0)
MCV: 80.1 fL (ref 78.0–100.0)
Platelets: 131 10*3/uL — ABNORMAL LOW (ref 150–400)
RBC: 4.48 MIL/uL (ref 3.87–5.11)
RDW: 15.2 % (ref 11.5–15.5)
WBC: 4.4 10*3/uL (ref 4.0–10.5)

## 2017-03-29 LAB — CBG MONITORING, ED: GLUCOSE-CAPILLARY: 433 mg/dL — AB (ref 65–99)

## 2017-03-29 LAB — ETHANOL: Alcohol, Ethyl (B): <10 mg/dL (ref <10)

## 2017-03-29 LAB — POC URINE PREG, ED: PREG TEST UR: NEGATIVE

## 2017-03-29 LAB — SALICYLATE LEVEL: Salicylate Lvl: <7 mg/dL (ref 2.8–30.0)

## 2017-03-29 LAB — ACETAMINOPHEN LEVEL: Acetaminophen (Tylenol), Serum: <10 ug/mL — ABNORMAL LOW (ref 10–30)

## 2017-03-29 NOTE — ED Triage Notes (Signed)
Pt to ED with c/o feeling suicidal since this am.  Pt st's she did not have a plan.

## 2017-03-29 NOTE — ED Notes (Signed)
Called Staffing for sitter 

## 2017-03-29 NOTE — ED Notes (Signed)
Sitter with pt at this time.   Pt changed into wine colored scrubs.  Security called to wand pt.

## 2017-03-30 ENCOUNTER — Emergency Department (HOSPITAL_COMMUNITY): Payer: Medicare Other

## 2017-03-30 DIAGNOSIS — F209 Schizophrenia, unspecified: Secondary | ICD-10-CM | POA: Diagnosis not present

## 2017-03-30 LAB — CBG MONITORING, ED
GLUCOSE-CAPILLARY: 390 mg/dL — AB (ref 65–99)
Glucose-Capillary: 320 mg/dL — ABNORMAL HIGH (ref 65–99)

## 2017-03-30 MED ORDER — BENZONATATE 100 MG PO CAPS: 100.0000 mg | ORAL_CAPSULE | Freq: Three times a day (TID) | ORAL | 0 refills | Status: DC | PRN

## 2017-03-30 MED ORDER — SODIUM CHLORIDE 0.9 % IV BOLUS (SEPSIS)
1000.0000 mL | Freq: Once | INTRAVENOUS | Status: AC
  Administered 2017-03-30: 1000 mL via INTRAVENOUS

## 2017-03-30 MED ORDER — INSULIN ASPART 100 UNIT/ML ~~LOC~~ SOLN
8.0000 [IU] | Freq: Once | SUBCUTANEOUS | Status: AC
  Administered 2017-03-30: 8 [IU] via SUBCUTANEOUS

## 2017-03-30 MED ORDER — LORAZEPAM 1 MG PO TABS
1.0000 mg | ORAL_TABLET | Freq: Once | ORAL | Status: AC
  Administered 2017-03-30: 1 mg via ORAL
  Filled 2017-03-30: qty 1

## 2017-03-30 MED ORDER — INSULIN ASPART 100 UNIT/ML ~~LOC~~ SOLN
10.0000 [IU] | Freq: Once | SUBCUTANEOUS | Status: AC
  Administered 2017-03-30: 10 [IU] via INTRAVENOUS
  Filled 2017-03-30: qty 1

## 2017-03-30 NOTE — ED Notes (Signed)
Patient transported to X-ray 

## 2017-03-30 NOTE — ED Notes (Signed)
Pt has received belongings, valuables, and medications. Pt verbalized that she had received everything of hers back. Pt is now leaving with [facility].

## 2017-03-30 NOTE — ED Provider Notes (Signed)
[facility] EMERGENCY DEPARTMENT Provider Note   CSN: [id number] Arrival date & time: 03/29/17  1934     History   Chief Complaint Chief Complaint  Patient presents with  . Suicidal    HPI Tina Saunders is a 32 y.o. female.  Patient originally presented for evaluation of suicidal ideation.  Patient reports that over the course of today she was having thoughts of harming herself, but did not formulate a plan.  She came to the ER to be evaluated for this.  While in the waiting room she has started to feel better.  She realizes that harming herself would be "stupid".  She has not feeling like she wants to harm her self and feel safe now, but does report that she continues to have anxiety.  This is a chronic problem for her.  She reports that she has been sick for more than a week with cough.  She has sinus congestion as well.  She has not had a fever.      Past Medical History:  Diagnosis Date  . Anxiety   . Bipolar 1 disorder ([place])   . Cancer of abdominal wall   . Depression   . Diabetes mellitus without complication ([place])   . Hypertension   . Obesity   . Obesity   . Polycystic ovarian syndrome 07/01/2011   Patient report  . 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 Schizophrenia [facility])     Patient Active Problem List   Diagnosis Date Noted  . DKA (diabetic ketoacidoses) ([place]) 03/14/2017  . Chronic constipation   . Seizures ([place]) 12/11/2016  . Acute lower UTI 12/11/2016  . DM2 (diabetes mellitus, type 2) ([place]) 12/11/2016  . Uncontrolled diabetes mellitus ([place]) 11/03/2016  . Schizoaffective disorder, bipolar type ([place]) 11/02/2016  . 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 Schizoaffective disorder, mixed type ([place]) 05/30/2014  . Suicidal ideation   . 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 Headache 04/15/2013  . HTN (hypertension) 04/15/2013  . Post traumatic stress disorder 12/07/2011  . CAP (community acquired pneumonia) 08/27/2011  . Chest pain  08/26/2011  . SOB (shortness of breath) 08/26/2011  . Fever 08/26/2011  . Hypokalemia 08/26/2011  . PSVT (paroxysmal supraventricular tachycardia) ([place]) 08/26/2011  . ADHD 09/23/2007  . EPIGASTRIC PAIN 09/23/2007  . Obesity, unspecified 07/30/2007  . Depression 07/30/2007  . SLEEP DISORDER 07/30/2007  . IMPAIRED FASTING GLUCOSE 07/30/2007  . FATIGUE 11/21/2006  . ABNORMAL FINDINGS, ELEVATED BP W/O HTN 11/21/2006  . METRORRHAGIA 06/13/2006  . DISORDER, MENSTRUAL NEC 06/13/2006  . DIZZINESS 06/13/2006  . POLYCYSTIC OVARIAN DISEASE 04/25/2006  . AMENORRHEA, SECONDARY 04/20/2006  . ACNE, MILD 04/20/2006  . Abdominal pain 04/20/2006    Past Surgical History:  Procedure Laterality Date  . CHOLECYSTECTOMY    . COLONOSCOPY WITH PROPOFOL N/A 03/08/2017   Procedure: COLONOSCOPY WITH PROPOFOL;  Surgeon: Milus Banister, MD;  Location: [facility] ENDOSCOPY;  Service: Endoscopy;  Laterality: N/A;  . HERNIA REPAIR    . Ovarian Cyst Excision    . VARICOSE VEIN SURGERY      OB History    Gravida Para Term Preterm AB Living   0             SAB TAB Ectopic Multiple Live Births                   Home Medications    Prior to Admission medications   Medication Sig Start Date End Date Taking? Authorizing  Provider  busPIRone (BUSPAR) 15 MG tablet Take 15 mg by mouth 3 (three) times daily.   Yes [provider]  chlorproMAZINE (THORAZINE) 50 MG tablet Take 1 tablet (50 mg total) by mouth 3 (three) times daily. For agitation/mood control 02/09/17  Yes Lindell Spar I, NP  cloNIDine (CATAPRES) 0.1 MG tablet Take 1 tablet (0.1 mg total) by mouth 2 (two) times daily. For high blood pressure 02/09/17  Yes Nwoko, Herbert Pun I, NP  gabapentin (NEURONTIN) 400 MG capsule Take 1 capsule (400 mg total) by mouth 3 (three) times daily. For agitation/diabetic neuropathy 02/09/17  Yes Lindell Spar I, NP  levETIRAcetam (KEPPRA) 500 MG tablet Take 1 tablet (500 mg total) by mouth 2 (two) times daily. For mood  stabilization 02/09/17  Yes Nwoko, Herbert Pun I, NP  lisinopril (PRINIVIL,ZESTRIL) 10 MG tablet Take 1 tablet (10 mg total) by mouth daily. For high blood pressure 02/09/17  Yes Nwoko, Agnes I, NP  pantoprazole (PROTONIX) 40 MG tablet Take 1 tablet (40 mg total) by mouth daily. For acid reflux 02/09/17  Yes Lindell Spar I, NP  QUEtiapine (SEROQUEL XR) 200 MG 24 hr tablet Take 500 mg by mouth at bedtime.   Yes [provider]  QUEtiapine (SEROQUEL) 100 MG tablet Take 5 tablets (500 mg total) by mouth at bedtime. For mood control Patient taking differently: Take 100 mg by mouth daily.  02/09/17  Yes Lindell Spar I, NP  sertraline (ZOLOFT) 100 MG tablet Take 200 mg by mouth daily.   Yes [provider]  simvastatin (ZOCOR) 10 MG tablet Take 10 mg by mouth at bedtime.   Yes [provider]  topiramate (TOPAMAX) 100 MG tablet Take 100 mg by mouth 2 (two) times daily.   Yes [provider]  traZODone (DESYREL) 50 MG tablet Take 50 mg by mouth at bedtime.   Yes [provider]  benzonatate (TESSALON) 100 MG capsule Take 1 capsule (100 mg total) by mouth 3 (three) times daily as needed for cough. 03/30/17   Orpah Greek, MD  cyclobenzaprine (FLEXERIL) 5 MG tablet Take 1 tablet (5 mg total) by mouth 2 (two) times daily as needed for muscle spasms. 03/16/17   Florencia Reasons, MD  dicyclomine (BENTYL) 20 MG tablet Take 1 tablet (20 mg total) by mouth every 8 (eight) hours as needed for spasms. 02/09/17   Lindell Spar I, NP  Dulaglutide (TRULICITY) 1.5 YC/1.4GY SOPN Inject 1.5 mg into the skin every Friday. For diabetes management 02/09/17   Lindell Spar I, NP  fenofibrate 160 MG tablet Take 1 tablet (160 mg total) by mouth daily. For high Cholesterol 02/10/17   Lindell Spar I, NP  hydrOXYzine (ATARAX/VISTARIL) 50 MG tablet Take 1 tablet (50 mg total) by mouth 3 (three) times daily as needed for anxiety. 02/09/17   Lindell Spar I, NP  ibuprofen (ADVIL,MOTRIN) 800 MG tablet Take  800 mg by mouth every 6 (six) hours as needed (pain).     [provider]  insulin aspart (NOVOLOG) 100 UNIT/ML injection Inject 6 Units into the skin 3 (three) times daily with meals. For diabetes management 02/09/17   Lindell Spar I, NP  insulin detemir (LEVEMIR) 100 UNIT/ML injection Inject 0.68 mLs (68 Units total) into the skin 2 (two) times daily. For diabetes management 03/16/17   Florencia Reasons, MD  magnesium oxide (MAG-OX) 400 MG tablet Take 1 tablet (400 mg total) by mouth daily. 03/16/17   Florencia Reasons, MD  QUEtiapine (SEROQUEL) 50 MG tablet Take 1  tablet (50 mg total) by mouth 2 (two) times daily. For agitation/mood control 02/09/17   Lindell Spar I, NP  sodium bicarbonate 650 MG tablet Take 1 tablet (650 mg total) by mouth daily. 03/16/17   Florencia Reasons, MD    Family History Family History  Problem Relation Age of Onset  . Coronary artery disease Maternal Grandmother   . Diabetes type II Maternal Grandmother   . Cancer Maternal Grandmother   . Hypertension Mother   . Hypertension Father     Social History Social History   Tobacco Use  . Smoking status: Never Smoker  . Smokeless tobacco: Never Used  Substance Use Topics  . Alcohol use: No  . Drug use: No     Allergies   Fish-derived products; Geodon [ziprasidone hcl]; Haldol [haloperidol lactate]; Buprenorphine hcl; Compazine [prochlorperazine]; Morphine and related; and Toradol [ketorolac tromethamine]   Review of Systems Review of Systems  Constitutional: Negative for fever.  HENT: Positive for congestion.   Respiratory: Positive for cough.   Psychiatric/Behavioral: Positive for dysphoric mood. The patient is nervous/anxious.   All other systems reviewed and are negative.    Physical Exam Updated Vital Signs BP (!) 128/59 (BP Location: Right Arm)   Pulse 92   Temp 98.2 F (36.8 C) (Oral)   Resp 18   Ht 5\' 4"  (1.626 m)   Wt 103 kg (227 lb)   LMP 03/22/2017 (Approximate)   SpO2 95%   BMI 38.96 kg/m    Physical Exam  Constitutional: She is oriented to person, place, and time. She appears well-developed and well-nourished. No distress.  HENT:  Head: Normocephalic and atraumatic.  Right Ear: Hearing normal.  Left Ear: Hearing normal.  Nose: Nose normal.  Mouth/Throat: Oropharynx is clear and moist and mucous membranes are normal.  Eyes: Conjunctivae and EOM are normal. Pupils are equal, round, and reactive to light.  Neck: Normal range of motion. Neck supple.  Cardiovascular: Regular rhythm, S1 normal and S2 normal. Exam reveals no gallop and no friction rub.  No murmur heard. Pulmonary/Chest: Effort normal and breath sounds normal. No respiratory distress. She exhibits no tenderness.  Abdominal: Soft. Normal appearance and bowel sounds are normal. There is no hepatosplenomegaly. There is no tenderness. There is no rebound, no guarding, no tenderness at McBurney's point and negative Murphy's sign. No hernia.  Musculoskeletal: Normal range of motion.  Neurological: She is alert and oriented to person, place, and time. She has normal strength. No cranial nerve deficit or sensory deficit. Coordination normal. GCS eye subscore is 4. GCS verbal subscore is 5. GCS motor subscore is 6.  Skin: Skin is warm, dry and intact. No rash noted. No cyanosis.  Psychiatric: She has a normal mood and affect. Her speech is normal and behavior is normal. Thought content normal.  Nursing note and vitals reviewed.    ED Treatments / Results  Labs (all labs ordered are listed, but only abnormal results are displayed) Labs Reviewed  COMPREHENSIVE METABOLIC PANEL - Abnormal; Notable for the following components:      Result Value   Sodium 132 (*)    Chloride 97 (*)    CO2 21 (*)    Glucose, Bld 437 (*)    BUN <5 (*)    All other components within normal limits  ACETAMINOPHEN LEVEL - Abnormal; Notable for the following components:   Acetaminophen (Tylenol), Serum <10 (*)    All other components within  normal limits  CBC - Abnormal; Notable for the following components:  Hemoglobin 11.4 (*)    HCT 35.9 (*)    MCH 25.4 (*)    Platelets 131 (*)    All other components within normal limits  CBG MONITORING, ED - Abnormal; Notable for the following components:   Glucose-Capillary 433 (*)    All other components within normal limits  CBG MONITORING, ED - Abnormal; Notable for the following components:   Glucose-Capillary 390 (*)    All other components within normal limits  CBG MONITORING, ED - Abnormal; Notable for the following components:   Glucose-Capillary 320 (*)    All other components within normal limits  ETHANOL  SALICYLATE LEVEL  RAPID URINE DRUG SCREEN, HOSP PERFORMED  I-STAT BETA HCG BLOOD, ED ([facility], [facility], [facility] ONLY)  POC URINE PREG, ED    EKG  EKG Interpretation None       Radiology Dg Chest 2 View  Result Date: 03/30/2017 CLINICAL DATA:  Cough for 2 weeks. EXAM: CHEST - 2 VIEW COMPARISON:  Radiograph 11/08/2016 FINDINGS: The cardiomediastinal contours are normal. The lungs are clear. Pulmonary vasculature is normal. No consolidation, pleural effusion, or pneumothorax. No acute osseous abnormalities are seen. IMPRESSION: No acute pulmonary process. Electronically Signed   By: Jeb Levering M.D.   On: 03/30/2017 03:00    Procedures Procedures (including critical care time)  Medications Ordered in ED Medications  sodium chloride 0.9 % bolus 1,000 mL (0 mLs Intravenous Stopped 03/30/17 0319)  insulin aspart (novoLOG) injection 10 Units (10 Units Intravenous Given 03/30/17 0222)  LORazepam (ATIVAN) tablet 1 mg (1 mg Oral Given 03/30/17 0222)     Initial Impression / Assessment and Plan / ED Course  I have reviewed the triage vital signs and the nursing notes.  Pertinent labs & imaging results that were available during my care of the patient were reviewed by me and considered in my medical decision making (see chart for details).     Patient had some passive  suicidal ideation earlier today but is no longer feeling suicidal.  She was mostly complaining of anxiety upon my evaluation.  This resolved with a dose of Ativan.  Patient did have elevated blood sugar.  She was treated with IV fluids and insulin.  She complains of a cough that has been present for approximately a week.  She does not have any clinical signs of pneumonia, but x-ray was performed because of the chronicity.  She does not have any pneumonia on x-ray, will treat as outpatient for upper respiratory infection with symptomatic treatment.  Patient was monitored for an extended period of time and then reevaluated.  She is still not feeling suicidal or depressed at this time and is therefore not a candidate for inpatient psychiatric treatment. She will be discharged into the care of her ACT team member.  Final Clinical Impressions(s) / ED Diagnoses   Final diagnoses:  Suicidal ideation  Anxiety  Hyperglycemia  Upper respiratory tract infection, unspecified type    ED Discharge Orders        Ordered    benzonatate (TESSALON) 100 MG capsule  3 times daily PRN     03/30/17 0351       Orpah Greek, MD 03/30/17 [phone]

## 2017-04-11 ENCOUNTER — Other Ambulatory Visit: Payer: Self-pay

## 2017-04-11 ENCOUNTER — Emergency Department (HOSPITAL_COMMUNITY)
Admission: EM | Admit: 2017-04-11 | Discharge: 2017-04-12 | Disposition: A | Discharge status: Home / Self Care | Payer: Medicare Other | Attending: Emergency Medicine | Admitting: Emergency Medicine

## 2017-04-11 ENCOUNTER — Emergency Department (HOSPITAL_COMMUNITY): Payer: Medicare Other

## 2017-04-11 ENCOUNTER — Encounter (HOSPITAL_COMMUNITY): Payer: Self-pay | Admitting: *Deleted

## 2017-04-11 DIAGNOSIS — T383X6A Underdosing of insulin and oral hypoglycemic [antidiabetic] drugs, initial encounter: Secondary | ICD-10-CM | POA: Diagnosis not present

## 2017-04-11 DIAGNOSIS — F250 Schizoaffective disorder, bipolar type: Secondary | ICD-10-CM | POA: Diagnosis present

## 2017-04-11 DIAGNOSIS — Z91128 Patient's intentional underdosing of medication regimen for other reason: Secondary | ICD-10-CM | POA: Diagnosis not present

## 2017-04-11 DIAGNOSIS — I10 Essential (primary) hypertension: Secondary | ICD-10-CM | POA: Insufficient documentation

## 2017-04-11 DIAGNOSIS — R45851 Suicidal ideations: Secondary | ICD-10-CM | POA: Diagnosis not present

## 2017-04-11 DIAGNOSIS — R05 Cough: Secondary | ICD-10-CM | POA: Diagnosis not present

## 2017-04-11 DIAGNOSIS — Z79899 Other long term (current) drug therapy: Secondary | ICD-10-CM | POA: Insufficient documentation

## 2017-04-11 LAB — COMPREHENSIVE METABOLIC PANEL
ALT: 29 U/L (ref 14–54)
AST: 45 U/L — ABNORMAL HIGH (ref 15–41)
Albumin: 3.6 g/dL (ref 3.5–5.0)
Alkaline Phosphatase: 151 U/L — ABNORMAL HIGH (ref 38–126)
Anion gap: 12 (ref 5–15)
BUN: 6 mg/dL (ref 6–20)
CHLORIDE: 98 mmol/L — AB (ref 101–111)
CO2: 26 mmol/L (ref 22–32)
CREATININE: 0.48 mg/dL (ref 0.44–1.00)
Calcium: 8.9 mg/dL (ref 8.9–10.3)
GFR calc Af Amer: >60 mL/min (ref >60)
GFR calc non Af Amer: >60 mL/min (ref >60)
GLUCOSE: 405 mg/dL — AB (ref 65–99)
Potassium: 3.4 mmol/L — ABNORMAL LOW (ref 3.5–5.1)
SODIUM: 136 mmol/L (ref 135–145)
Total Bilirubin: 0.3 mg/dL (ref 0.3–1.2)
Total Protein: 7.2 g/dL (ref 6.5–8.1)

## 2017-04-11 LAB — CBC WITH DIFFERENTIAL/PLATELET
Basophils Absolute: 0 10*3/uL (ref 0.0–0.1)
Basophils Relative: 0 %
EOS PCT: 3 %
Eosinophils Absolute: 0.1 10*3/uL (ref 0.0–0.7)
HCT: 34.5 % — ABNORMAL LOW (ref 36.0–46.0)
HEMOGLOBIN: 11 g/dL — AB (ref 12.0–15.0)
LYMPHS ABS: 1.5 10*3/uL (ref 0.7–4.0)
LYMPHS PCT: 31 %
MCH: 25.6 pg — AB (ref 26.0–34.0)
MCHC: 31.9 g/dL (ref 30.0–36.0)
MCV: 80.2 fL (ref 78.0–100.0)
MONOS PCT: 4 %
Monocytes Absolute: 0.2 10*3/uL (ref 0.1–1.0)
NEUTROS PCT: 62 %
Neutro Abs: 2.9 10*3/uL (ref 1.7–7.7)
Platelets: 119 10*3/uL — ABNORMAL LOW (ref 150–400)
RBC: 4.3 MIL/uL (ref 3.87–5.11)
RDW: 15.3 % (ref 11.5–15.5)
WBC: 4.7 10*3/uL (ref 4.0–10.5)

## 2017-04-11 LAB — RAPID URINE DRUG SCREEN, HOSP PERFORMED
Amphetamines: NOT DETECTED
BENZODIAZEPINES: NOT DETECTED
Barbiturates: NOT DETECTED
Cocaine: NOT DETECTED
OPIATES: NOT DETECTED
Tetrahydrocannabinol: NOT DETECTED

## 2017-04-11 LAB — I-STAT BETA HCG BLOOD, ED (MC, WL, AP ONLY)

## 2017-04-11 LAB — CBG MONITORING, ED: Glucose-Capillary: 366 mg/dL — ABNORMAL HIGH (ref 65–99)

## 2017-04-11 LAB — ETHANOL: Alcohol, Ethyl (B): <10 mg/dL (ref <10)

## 2017-04-11 MED ORDER — CLONIDINE HCL 0.1 MG PO TABS
0.1000 mg | ORAL_TABLET | Freq: Two times a day (BID) | ORAL | Status: DC
  Administered 2017-04-11 – 2017-04-12 (×2): 0.1 mg via ORAL
  Filled 2017-04-11 (×2): qty 1

## 2017-04-11 MED ORDER — HYDROXYZINE HCL 25 MG PO TABS: 50.0000 mg | ORAL_TABLET | Freq: Three times a day (TID) | ORAL | Status: DC | PRN

## 2017-04-11 MED ORDER — LISINOPRIL 10 MG PO TABS
10.0000 mg | ORAL_TABLET | Freq: Every day | ORAL | Status: DC
  Administered 2017-04-11 – 2017-04-12 (×2): 10 mg via ORAL
  Filled 2017-04-11 (×2): qty 1

## 2017-04-11 MED ORDER — PANTOPRAZOLE SODIUM 40 MG PO TBEC
40.0000 mg | DELAYED_RELEASE_TABLET | Freq: Every day | ORAL | Status: DC
  Administered 2017-04-11 – 2017-04-12 (×2): 40 mg via ORAL
  Filled 2017-04-11 (×2): qty 1

## 2017-04-11 MED ORDER — SERTRALINE HCL 50 MG PO TABS
200.0000 mg | ORAL_TABLET | Freq: Every day | ORAL | Status: DC
  Administered 2017-04-11 – 2017-04-12 (×2): 200 mg via ORAL
  Filled 2017-04-11 (×2): qty 4

## 2017-04-11 MED ORDER — TRAZODONE HCL 50 MG PO TABS
50.0000 mg | ORAL_TABLET | Freq: Every day | ORAL | Status: DC
  Administered 2017-04-11: 50 mg via ORAL
  Filled 2017-04-11: qty 1

## 2017-04-11 MED ORDER — QUETIAPINE FUMARATE ER 300 MG PO TB24
500.0000 mg | ORAL_TABLET | Freq: Every day | ORAL | Status: DC
  Administered 2017-04-11: 500 mg via ORAL
  Filled 2017-04-11: qty 1

## 2017-04-11 MED ORDER — TOPIRAMATE 100 MG PO TABS
100.0000 mg | ORAL_TABLET | Freq: Two times a day (BID) | ORAL | Status: DC
  Administered 2017-04-11 – 2017-04-12 (×2): 100 mg via ORAL
  Filled 2017-04-11 (×2): qty 1

## 2017-04-11 MED ORDER — SIMVASTATIN 10 MG PO TABS
10.0000 mg | ORAL_TABLET | Freq: Every day | ORAL | Status: DC
  Administered 2017-04-11: 10 mg via ORAL
  Filled 2017-04-11: qty 1

## 2017-04-11 MED ORDER — INSULIN DETEMIR 100 UNIT/ML ~~LOC~~ SOLN
68.0000 [IU] | Freq: Two times a day (BID) | SUBCUTANEOUS | Status: DC
  Administered 2017-04-11 – 2017-04-12 (×2): 68 [IU] via SUBCUTANEOUS
  Filled 2017-04-11 (×2): qty 0.68

## 2017-04-11 MED ORDER — CHLORPROMAZINE HCL 25 MG PO TABS
50.0000 mg | ORAL_TABLET | Freq: Three times a day (TID) | ORAL | Status: DC
  Administered 2017-04-11 – 2017-04-12 (×2): 50 mg via ORAL
  Filled 2017-04-11 (×2): qty 2

## 2017-04-11 MED ORDER — INSULIN ASPART 100 UNIT/ML ~~LOC~~ SOLN
6.0000 [IU] | Freq: Three times a day (TID) | SUBCUTANEOUS | Status: DC
  Administered 2017-04-12: 6 [IU] via SUBCUTANEOUS
  Filled 2017-04-11: qty 1

## 2017-04-11 MED ORDER — BUSPIRONE HCL 10 MG PO TABS
30.0000 mg | ORAL_TABLET | Freq: Two times a day (BID) | ORAL | Status: DC
  Administered 2017-04-11 – 2017-04-12 (×2): 30 mg via ORAL
  Filled 2017-04-11 (×2): qty 3

## 2017-04-11 MED ORDER — LEVETIRACETAM 500 MG PO TABS
500.0000 mg | ORAL_TABLET | Freq: Two times a day (BID) | ORAL | Status: DC
  Administered 2017-04-11 – 2017-04-12 (×2): 500 mg via ORAL
  Filled 2017-04-11 (×2): qty 1

## 2017-04-11 NOTE — ED Provider Notes (Signed)
[facility] DEPT Provider Note   CSN: [id number] Arrival date & time: 04/11/17  1934     History   Chief Complaint Chief Complaint  Patient presents with  . Suicidal    HPI Tina Saunders is a 32 y.o. female.  Patient is a 32 year old female with a history of schizophrenia, bipolar disease, diabetes, with multiple visits to the emergency room for suicidal ideation who is presenting today stating that she is suicidal.  Patient states the roommate who she is living with moved out today because they got in a fight.  She called her names and said that she was worthless.  Patient states she would like to kill herself by stabbing herself in the heart with a kitchen knife.  She states she has tried to stab herself in the past and has tried to commit suicide in the past.  She also has a history of diabetes and states she has not taken her insulin for the last 1 week because she continues to forget.  When asked if she is not taking her insulin to kill herself she states no.  She says that she is going to be moving back into assisted living because she cannot manage her medications.  She denies any appetite changes but does complain of a cough for the last 2 weeks.  She denies any shortness of breath.  She denies drug use.   The history is provided by the patient.    Past Medical History:  Diagnosis Date  . Anxiety   . Bipolar 1 disorder ([place])   . Cancer of abdominal wall   . Depression   . Diabetes mellitus without complication ([place])   . Hypertension   . Obesity   . Obesity   . Polycystic ovarian syndrome 07/01/2011   Patient report  . 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 Schizophrenia [facility])     Patient Active Problem List   Diagnosis Date Noted  . DKA (diabetic ketoacidoses) ([place]) 03/14/2017  . Chronic constipation   . Seizures ([place]) 12/11/2016  . Acute lower UTI 12/11/2016  . DM2 (diabetes mellitus, type 2) ([place]) 12/11/2016  . Uncontrolled diabetes mellitus  ([place]) 11/03/2016  . Schizoaffective disorder, bipolar type ([place]) 11/02/2016  . Cluster B personality disorder ([place]) 11/02/2016  . Schizoaffective disorder, mixed type ([place]) 05/30/2014  . Suicidal ideation   . 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 Headache 04/15/2013  . HTN (hypertension) 04/15/2013  . Post traumatic stress disorder 12/07/2011  . CAP (community acquired pneumonia) 08/27/2011  . Chest pain 08/26/2011  . SOB (shortness of breath) 08/26/2011  . Fever 08/26/2011  . Hypokalemia 08/26/2011  . PSVT (paroxysmal supraventricular tachycardia) ([place]) 08/26/2011  . ADHD 09/23/2007  . EPIGASTRIC PAIN 09/23/2007  . Obesity, unspecified 07/30/2007  . Depression 07/30/2007  . SLEEP DISORDER 07/30/2007  . IMPAIRED FASTING GLUCOSE 07/30/2007  . FATIGUE 11/21/2006  . ABNORMAL FINDINGS, ELEVATED BP W/O HTN 11/21/2006  . METRORRHAGIA 06/13/2006  . DISORDER, MENSTRUAL NEC 06/13/2006  . DIZZINESS 06/13/2006  . POLYCYSTIC OVARIAN DISEASE 04/25/2006  . AMENORRHEA, SECONDARY 04/20/2006  . ACNE, MILD 04/20/2006  . Abdominal pain 04/20/2006    Past Surgical History:  Procedure Laterality Date  . CHOLECYSTECTOMY    . COLONOSCOPY WITH PROPOFOL N/A 03/08/2017   Procedure: COLONOSCOPY WITH PROPOFOL;  Surgeon: Milus Banister, MD;  [facility] END[facility];  Service: Endoscopy;  Laterality: N/A;  . HERNIA REPAIR    . Ovarian Cyst Excision    . VARICOSE VEIN  SURGERY      OB History    Gravida Para Term Preterm AB Living   0             SAB TAB Ectopic Multiple Live Births                   Home Medications    Prior to Admission medications   Medication Sig Start Date End Date Taking? Authorizing Provider  busPIRone (BUSPAR) 30 MG tablet Take 30 mg by mouth 2 (two) times daily.   Yes [provider]  chlorproMAZINE (THORAZINE) 50 MG tablet Take 1 tablet (50 mg total) by mouth 3 (three) times daily. For agitation/mood control 02/09/17  Yes Lindell Spar I, NP  cloNIDine  (CATAPRES) 0.1 MG tablet Take 1 tablet (0.1 mg total) by mouth 2 (two) times daily. For high blood pressure 02/09/17  Yes Nwoko, Herbert Pun I, NP  Dulaglutide (TRULICITY) 1.5 EX/9.3ZJ SOPN Inject 1.5 mg into the skin every Friday. For diabetes management 02/09/17  Yes Lindell Spar I, NP  gabapentin (NEURONTIN) 400 MG capsule Take 1 capsule (400 mg total) by mouth 3 (three) times daily. For agitation/diabetic neuropathy 02/09/17  Yes Lindell Spar I, NP  insulin aspart (NOVOLOG) 100 UNIT/ML injection Inject 6 Units into the skin 3 (three) times daily with meals. For diabetes management 02/09/17  Yes Lindell Spar I, NP  insulin detemir (LEVEMIR) 100 UNIT/ML injection Inject 0.68 mLs (68 Units total) into the skin 2 (two) times daily. For diabetes management 03/16/17  Yes Florencia Reasons, MD  levETIRAcetam (KEPPRA) 500 MG tablet Take 1 tablet (500 mg total) by mouth 2 (two) times daily. For mood stabilization 02/09/17  Yes Nwoko, Herbert Pun I, NP  lisinopril (PRINIVIL,ZESTRIL) 10 MG tablet Take 1 tablet (10 mg total) by mouth daily. For high blood pressure 02/09/17  Yes Nwoko, Agnes I, NP  pantoprazole (PROTONIX) 40 MG tablet Take 1 tablet (40 mg total) by mouth daily. For acid reflux 02/09/17  Yes Lindell Spar I, NP  QUEtiapine (SEROQUEL XR) 200 MG 24 hr tablet Take 500 mg by mouth at bedtime.   Yes [provider]  QUEtiapine (SEROQUEL) 100 MG tablet Take 5 tablets (500 mg total) by mouth at bedtime. For mood control Patient taking differently: Take 100 mg by mouth daily.  02/09/17  Yes Lindell Spar I, NP  sertraline (ZOLOFT) 100 MG tablet Take 200 mg by mouth daily.   Yes [provider]  simvastatin (ZOCOR) 10 MG tablet Take 10 mg by mouth at bedtime.   Yes [provider]  topiramate (TOPAMAX) 100 MG tablet Take 100 mg by mouth 2 (two) times daily.   Yes [provider]  traZODone (DESYREL) 50 MG tablet Take 50 mg by mouth at bedtime.   Yes [provider]  benzonatate (TESSALON)  100 MG capsule Take 1 capsule (100 mg total) by mouth 3 (three) times daily as needed for cough. 03/30/17   Orpah Greek, MD  cyclobenzaprine (FLEXERIL) 5 MG tablet Take 1 tablet (5 mg total) by mouth 2 (two) times daily as needed for muscle spasms. 03/16/17   Florencia Reasons, MD  dicyclomine (BENTYL) 20 MG tablet Take 1 tablet (20 mg total) by mouth every 8 (eight) hours as needed for spasms. Patient not taking: Reported on 04/11/2017 02/09/17   Lindell Spar I, NP  fenofibrate 160 MG tablet Take 1 tablet (160 mg total) by mouth daily. For high Cholesterol Patient not taking: Reported on 04/11/2017 02/10/17   Nwoko,  Loleta Dicker, NP  hydrOXYzine (ATARAX/VISTARIL) 50 MG tablet Take 1 tablet (50 mg total) by mouth 3 (three) times daily as needed for anxiety. 02/09/17   Lindell Spar I, NP  magnesium oxide (MAG-OX) 400 MG tablet Take 1 tablet (400 mg total) by mouth daily. Patient not taking: Reported on 04/11/2017 03/16/17   Florencia Reasons, MD  QUEtiapine (SEROQUEL) 50 MG tablet Take 1 tablet (50 mg total) by mouth 2 (two) times daily. For agitation/mood control Patient not taking: Reported on 04/11/2017 02/09/17   Lindell Spar I, NP  sodium bicarbonate 650 MG tablet Take 1 tablet (650 mg total) by mouth daily. Patient not taking: Reported on 04/11/2017 03/16/17   Florencia Reasons, MD    Family History Family History  Problem Relation Age of Onset  . Coronary artery disease Maternal Grandmother   . Diabetes type II Maternal Grandmother   . Cancer Maternal Grandmother   . Hypertension Mother   . Hypertension Father     Social History Social History   Tobacco Use  . Smoking status: Never Smoker  . Smokeless tobacco: Never Used  Substance Use Topics  . Alcohol use: No  . Drug use: No     Allergies   Fish-derived products; Geodon [ziprasidone hcl]; Haldol [haloperidol lactate]; Buprenorphine hcl; Compazine [prochlorperazine]; Morphine and related; and Toradol [ketorolac tromethamine]   Review of Systems Review  of Systems  Constitutional: Negative for fever.  Respiratory: Positive for cough. Negative for shortness of breath and wheezing.        No sputum production  All other systems reviewed and are negative.    Physical Exam Updated Vital Signs BP (!) 148/89 (BP Location: Right Arm)   Pulse 97   Temp 98.9 F (37.2 C) (Oral)   Resp 18   Ht 5\' 4"  (1.626 m)   Wt 103 kg (227 lb)   LMP 03/22/2017 (Approximate)   SpO2 92%   BMI 38.96 kg/m   Physical Exam  Constitutional: She is oriented to person, place, and time. She appears well-developed and well-nourished. No distress.  Morbidly obese  HENT:  Head: Normocephalic and atraumatic.  Mouth/Throat: Oropharynx is clear and moist.  Eyes: Conjunctivae and EOM are normal. Pupils are equal, round, and reactive to light.  Neck: Normal range of motion. Neck supple.  Cardiovascular: Normal rate, regular rhythm and intact distal pulses.  No murmur heard. Pulmonary/Chest: Effort normal and breath sounds normal. No respiratory distress. She has no wheezes. She has no rales.  Abdominal: Soft. She exhibits no distension. There is no tenderness. There is no rebound and no guarding.  Musculoskeletal: Normal range of motion. She exhibits no edema or tenderness.  Neurological: She is alert and oriented to person, place, and time.  Skin: Skin is warm and dry. No rash noted. No erythema.  Psychiatric: She has a normal mood and affect. Her speech is normal and behavior is normal. She is not agitated. She expresses suicidal ideation. She expresses no homicidal ideation. She expresses suicidal plans.  Nursing note and vitals reviewed.    ED Treatments / Results  Labs (all labs ordered are listed, but only abnormal results are displayed) Labs Reviewed  COMPREHENSIVE METABOLIC PANEL - Abnormal; Notable for the following components:      Result Value   Potassium 3.4 (*)    Chloride 98 (*)    Glucose, Bld 405 (*)    AST 45 (*)    Alkaline Phosphatase  151 (*)    All other components within normal limits  CBC WITH DIFFERENTIAL/PLATELET - Abnormal; Notable for the following components:   Hemoglobin 11.0 (*)    HCT 34.5 (*)    MCH 25.6 (*)    Platelets 119 (*)    All other components within normal limits  ETHANOL  RAPID URINE DRUG SCREEN, HOSP PERFORMED  I-STAT BETA HCG BLOOD, ED ([facility], [facility], [facility] ONLY)    EKG  EKG Interpretation None       Radiology No results found.  Procedures Procedures (including critical care time)  Medications Ordered in ED Medications - No data to display   Initial Impression / Assessment and Plan / ED Course  I have reviewed the triage vital signs and the nursing notes.  Pertinent labs & imaging results that were available during my care of the patient were reviewed by me and considered in my medical decision making (see chart for details).     Patient presenting today with suicidal ideations after having a fight with her roommate.  Patient comes to the emergency department frequently for similar symptoms.  She is currently calm and cooperative.  She also has a known history of diabetes and states she is not taking her insulin most likely for the last week.  However she states she is not doing this to hurt herself.  Will check blood sugar and ensure that patient is medically clear.  Then will have TTS evaluate it.  Patient is not currently displaying signs of DKA.  However given she has complained of a cough for the last 2 weeks we will do a chest x-ray.  10:00 PM Patient's labs are reassuring except for her blood sugar of 400.  We will start her back on home insulin therapy.  Patient also given her blood pressure medication.  Medically clear for TTS  Final Clinical Impressions(s) / ED Diagnoses   Final diagnoses:  Suicidal ideation    ED Discharge Orders    None       Blanchie Dessert, MD 04/11/17 2201

## 2017-04-11 NOTE — ED Notes (Signed)
TTS assessment in progress. 

## 2017-04-11 NOTE — ED Notes (Signed)
Pt stated "[facility] is trying to get me back into assisted living."

## 2017-04-11 NOTE — ED Triage Notes (Signed)
Per [facility], Pt is hyperglycemic d/t not wanting to take her insulin.  Pt got into an argument with a friend & stated she was suicidal.
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
## 2017-04-11 NOTE — ED Notes (Signed)
Pt stated "The program I'm in, I'm not supposed to let anyone live with me and I let my girlfriend stay with me for the w/e, then she wouldn't leave.  She told me I was worthless and was no good."

## 2017-04-12 DIAGNOSIS — F250 Schizoaffective disorder, bipolar type: Secondary | ICD-10-CM

## 2017-04-12 DIAGNOSIS — G4700 Insomnia, unspecified: Secondary | ICD-10-CM

## 2017-04-12 DIAGNOSIS — F419 Anxiety disorder, unspecified: Secondary | ICD-10-CM

## 2017-04-12 DIAGNOSIS — R450 Nervousness: Secondary | ICD-10-CM

## 2017-04-12 LAB — CBG MONITORING, ED: GLUCOSE-CAPILLARY: 365 mg/dL — AB (ref 65–99)

## 2017-04-12 NOTE — BH Assessment (Signed)
[facility] Assessment Progress Note  Per Buford Dresser, DO, this pt does not require psychiatric hospitalization at this time.  Pt is to be discharged from [facility] with recommendation to continue treatment with the [facility] Team.  This has been included in pt's discharge instructions.  At 09:18 this Probation officer called the [employer] and spoke to [place].  She agrees to present at [facility] around 11:00 to pick pt up.  Pt's nurse has been notified.  Jalene Mullet, [place] Triage Specialist [phone]

## 2017-04-12 NOTE — [facility] Suicide Risk Assessment (Signed)
Suicide Risk Assessment  Discharge Assessment   [facility] Discharge Suicide Risk Assessment   Principal Problem: Schizoaffective disorder, mixed type [facility]) Discharge Diagnoses:  Patient Active Problem List   Diagnosis Date Noted  . Schizoaffective disorder, mixed type ([place]) [F25.0] 05/30/2014    Priority: High  . DKA (diabetic ketoacidoses) ([place]) [E13.10] 03/14/2017  . Chronic constipation [K59.09]   . Seizures ([place]) [R56.9] 12/11/2016  . Acute lower UTI [N39.0] 12/11/2016  . DM2 (diabetes mellitus, type 2) ([place]) [E11.9] 12/11/2016  . Uncontrolled diabetes mellitus ([place]) [E11.65] 11/03/2016  . Schizoaffective disorder, bipolar type ([place]) [F25.0] 11/02/2016  . Cluster B personality disorder ([place]) [F60.9] 11/02/2016  . Suicidal ideation [R45.851]   . 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 Headache [R51] 04/15/2013  . HTN (hypertension) [I10] 04/15/2013  . Post traumatic stress disorder [F43.10] 12/07/2011  . CAP (community acquired pneumonia) [J18.9] 08/27/2011  . Chest pain [R07.9] 08/26/2011  . SOB (shortness of breath) [R06.02] 08/26/2011  . Fever [R50.9] 08/26/2011  . Hypokalemia [E87.6] 08/26/2011  . PSVT (paroxysmal supraventricular tachycardia) ([place]) [I47.1] 08/26/2011  . ADHD [F90.9] 09/23/2007  . EPIGASTRIC PAIN [R10.13] 09/23/2007  . Obesity, unspecified [E66.9] 07/30/2007  . Depression [F32.9] 07/30/2007  . SLEEP DISORDER [G47.9] 07/30/2007  . IMPAIRED FASTING GLUCOSE [R73.01] 07/30/2007  . FATIGUE [R53.81, R53.83] 11/21/2006  . ABNORMAL FINDINGS, ELEVATED BP W/O HTN [R03.0] 11/21/2006  . METRORRHAGIA [N92.1] 06/13/2006  . DISORDER, MENSTRUAL NEC [N94.9] 06/13/2006  . DIZZINESS [R42] 06/13/2006  . POLYCYSTIC OVARIAN DISEASE [E28.2] 04/25/2006  . AMENORRHEA, SECONDARY [N91.2] 04/20/2006  . ACNE, MILD [L70.8] 04/20/2006  . Abdominal pain [R10.9] 04/20/2006    Total Time spent with patient: 45 minutes  Musculoskeletal: Strength & Muscle Tone: within normal  limits Gait & Station: normal Patient leans: N/A  Psychiatric Specialty Exam: Physical Exam  Constitutional: She is oriented to person, place, and time. She appears well-developed and well-nourished.  HENT:  Head: Normocephalic.  Neck: Normal range of motion.  Respiratory: Effort normal.  Musculoskeletal: Normal range of motion.  Neurological: She is alert and oriented to person, place, and time.  Psychiatric: She has a normal mood and affect. Her speech is normal and behavior is normal. Judgment and thought content normal. Cognition and memory are normal.    Review of Systems  Psychiatric/Behavioral: The patient is nervous/anxious.   All other systems reviewed and are negative.   Blood pressure (!) 108/51, pulse 80, temperature 98.3 F (36.8 C), temperature source Oral, resp. rate 18, height 5\' 4"  (1.626 m), weight 103 kg (227 lb), last menstrual period 04/04/2017, SpO2 96 %.Body mass index is 38.96 kg/m.  General Appearance: Casual  Eye Contact:  Good  Speech:  Normal Rate  Volume:  Normal  Mood:  Anxious, mild  Affect:  Congruent  Thought Process:  Coherent and Descriptions of Associations: Intact  Orientation:  Full (Time, Place, and Person)  Thought Content:  WDL and Logical  Suicidal Thoughts:  No  Homicidal Thoughts:  No  Memory:  Immediate;   Good Recent;   Good Remote;   Good  Judgement:  Fair  Insight:  Fair  Psychomotor Activity:  Normal  Concentration:  Concentration: Good and Attention Span: Good  Recall:  Good  Fund of Knowledge:  Fair  Language:  Good  Akathisia:  No  Handed:  Right  AIMS (if indicated):     Assets:  Housing Leisure Time Physical Health Resilience Social Support  ADL's:  Intact  Cognition:  WNL  Sleep:  Mental Status Per Nursing Assessment::   On Admission:   suicidal ideations  Demographic Factors:  Adolescent or young adult, Caucasian and Living alone  Loss Factors: NA  Historical Factors: NA  Risk Reduction  Factors:   Sense of responsibility to family, Positive social support and Positive therapeutic relationship  Continued Clinical Symptoms:  Anxiety, mild  Cognitive Features That Contribute To Risk:  None    Suicide Risk:  Minimal: No identifiable suicidal ideation.  Patients presenting with no risk factors but with morbid ruminations; may be classified as minimal risk based on the severity of the depressive symptoms    Plan Of Care/Follow-up recommendations:  Activity:  as tolerated Diet:  heart healthy diet  Qamar Aughenbaugh, NP 04/12/2017, 9:00 AM

## 2017-04-12 NOTE — Discharge Instructions (Signed)
For your mental health needs, you are advised to continue treatment with the [facility] ACT Team:       [facility]      [address]      [place], [place] [address]      [phone]      24 Hour Crisis number: [phone]

## 2017-04-12 NOTE — Consult Note (Addendum)
[facility] Psychiatry Consult   Reason for Consult:  Suicidal ideations Referring Physician:  EDP Patient Identification: Tina Saunders MRN:  [id number] Principal Diagnosis: Schizoaffective disorder, mixed type [facility]) Diagnosis:   Patient Active Problem List   Diagnosis Date Noted  . Schizoaffective disorder, mixed type ([place]) [F25.0] 05/30/2014    Priority: High  . DKA (diabetic ketoacidoses) ([place]) [E13.10] 03/14/2017  . Chronic constipation [K59.09]   . Seizures ([place]) [R56.9] 12/11/2016  . Acute lower UTI [N39.0] 12/11/2016  . DM2 (diabetes mellitus, type 2) ([place]) [E11.9] 12/11/2016  . Uncontrolled diabetes mellitus ([place]) [E11.65] 11/03/2016  . Schizoaffective disorder, bipolar type ([place]) [F25.0] 11/02/2016  . Cluster B personality disorder ([place]) [F60.9] 11/02/2016  . Suicidal ideation [R45.851]   . 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 Headache [R51] 04/15/2013  . HTN (hypertension) [I10] 04/15/2013  . Post traumatic stress disorder [F43.10] 12/07/2011  . CAP (community acquired pneumonia) [J18.9] 08/27/2011  . Chest pain [R07.9] 08/26/2011  . SOB (shortness of breath) [R06.02] 08/26/2011  . Fever [R50.9] 08/26/2011  . Hypokalemia [E87.6] 08/26/2011  . PSVT (paroxysmal supraventricular tachycardia) ([place]) [I47.1] 08/26/2011  . ADHD [F90.9] 09/23/2007  . EPIGASTRIC PAIN [R10.13] 09/23/2007  . Obesity, unspecified [E66.9] 07/30/2007  . Depression [F32.9] 07/30/2007  . SLEEP DISORDER [G47.9] 07/30/2007  . IMPAIRED FASTING GLUCOSE [R73.01] 07/30/2007  . FATIGUE [R53.81, R53.83] 11/21/2006  . ABNORMAL FINDINGS, ELEVATED BP W/O HTN [R03.0] 11/21/2006  . METRORRHAGIA [N92.1] 06/13/2006  . DISORDER, MENSTRUAL NEC [N94.9] 06/13/2006  . DIZZINESS [R42] 06/13/2006  . POLYCYSTIC OVARIAN DISEASE [E28.2] 04/25/2006  . AMENORRHEA, SECONDARY [N91.2] 04/20/2006  . ACNE, MILD [L70.8] 04/20/2006  . Abdominal pain [R10.9] 04/20/2006    Total Time spent with patient: 45  minutes  Subjective:   Tina Saunders is a 32 y.o. female patient does not warrant admission.  HPI:  32 yo female who got upset by a friend and had suicidal ideations.  This morning she is calm and cooperative, no longer upset.  No suicidal/homicidal ideations, hallucinations, or substance abuse.  She feels safe returning home, ACT team via [place] notified for coordination of care.  Stable for discharge.  Past Psychiatric History: schizoaffective disorder  Risk to Self: NOne Risk to Others: Homicidal Ideation: No(Pt denies. ) Thoughts of Harm to Others: No Current Homicidal Intent: No Current Homicidal Plan: No Access to Homicidal Means: No Identified Victim: NA History of harm to others?: No Assessment of Violence: None Noted Violent Behavior Description: NA Does patient have access to weapons?: No Criminal Charges Pending?: No Does patient have a court date: No Prior Inpatient Therapy: Prior Inpatient Therapy: Yes Prior Therapy Dates: Per chart, multiple..  Prior Therapy Facilty/Provider(s): Per chart, [facility], [facility], [facility].  Reason for Treatment: Per chart, BPD, MDD, SI.  Prior Outpatient Therapy: Prior Outpatient Therapy: Yes Prior Therapy Dates: Current Prior Therapy Facilty/Provider(s): [facility] Reason for Treatment: Medication management and counseling.  Does patient have an ACCT team?: Yes Does patient have Intensive In-House Services?  : No Does patient have [facility] services? : Yes Does patient have P4CC services?: No  Past Medical History:  Past Medical History:  Diagnosis Date  . Anxiety   . Bipolar 1 disorder ([place])   . Cancer of abdominal wall   . Depression   . Diabetes mellitus without complication ([place])   . Hypertension   . Obesity   . Obesity   . Polycystic ovarian syndrome 07/01/2011   Patient report  . 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  Schizophrenia [facility])     Past Surgical History:  Procedure Laterality Date  . CHOLECYSTECTOMY    . COLONOSCOPY  WITH PROPOFOL N/A 03/08/2017   Procedure: COLONOSCOPY WITH PROPOFOL;  Surgeon: Milus Banister, MD;  Location: [facility];  Service: Endoscopy;  Laterality: N/A;  . HERNIA REPAIR    . Ovarian Cyst Excision    . VARICOSE VEIN SURGERY     Family History:  Family History  Problem Relation Age of Onset  . Coronary artery disease Maternal Grandmother   . Diabetes type II Maternal Grandmother   . Cancer Maternal Grandmother   . Hypertension Mother   . Hypertension Father    Family Psychiatric  History: none Social History:  Social History   Substance and Sexual Activity  Alcohol Use No     Social History   Substance and Sexual Activity  Drug Use No    Social History   Socioeconomic History  . Marital status: Single    Spouse name: Not on file  . Number of children: 0  . Years of education: Not on file  . Highest education level: Not on file  Occupational History  . Not on file  Social Needs  . Financial resource strain: Not on file  . Food insecurity:    Worry: Not on file    Inability: Not on file  . Transportation needs:    Medical: Not on file    Non-medical: Not on file  Tobacco Use  . Smoking status: Never Smoker  . Smokeless tobacco: Never Used  Substance and Sexual Activity  . Alcohol use: No  . Drug use: No  . Sexual activity: Never    Birth control/protection: None  Lifestyle  . Physical activity:    Days per week: Not on file    Minutes per session: Not on file  . Stress: Not on file  Relationships  . Social connections:    Talks on phone: Not on file    Gets together: Not on file    Attends religious service: Not on file    Active member of club or organization: Not on file    Attends meetings of clubs or organizations: Not on file    Relationship status: Not on file  Other Topics Concern  . Not on file  Social History Narrative  . Not on file   Additional Social History: N/A    Allergies:   Allergies  Allergen Reactions  .  Fish-Derived Products Anaphylaxis    Can only eat FLounder  . Geodon [Ziprasidone Hcl] Other (See Comments)    Face pulls, cant swallow - Locked Jaw  . Haldol [Haloperidol Lactate] Other (See Comments)    Face pulls, can't swallow - Locked Jaw  . Buprenorphine Hcl Hives, Itching, Rash and Other (See Comments)    GI upset  . Compazine [Prochlorperazine] Other (See Comments)    anxiety and hyperactivity  . Morphine And Related Hives, Itching, Rash and Other (See Comments)    GI upset  . Toradol [Ketorolac Tromethamine] Other (See Comments)    Anxiety and hyperactivity    Labs:  Results for orders placed or performed during the hospital encounter of 04/11/17 (from the past 48 hour(s))  Ethanol     Status: None   Collection Time: 04/11/17  7:58 PM  Result Value Ref Range   Alcohol, Ethyl (B) <10 <10 mg/dL    Comment:        LOWEST DETECTABLE LIMIT FOR SERUM ALCOHOL IS 10 mg/dL FOR  MEDICAL PURPOSES ONLY Performed at [facility], [place] [address] [place], [place] [address]   Comprehensive metabolic panel     Status: Abnormal   Collection Time: 04/11/17  9:00 PM  Result Value Ref Range   Sodium 136 135 - 145 mmol/L   Potassium 3.4 (L) 3.5 - 5.1 mmol/L   Chloride 98 (L) 101 - 111 mmol/L   CO2 26 22 - 32 mmol/L   Glucose, Bld 405 (H) 65 - 99 mg/dL   BUN 6 6 - 20 mg/dL   Creatinine, Ser 0.48 0.44 - 1.00 mg/dL   Calcium 8.9 8.9 - 10.3 mg/dL   Total Protein 7.2 6.5 - 8.1 g/dL   Albumin 3.6 3.5 - 5.0 g/dL   AST 45 (H) 15 - 41 U/L   ALT 29 14 - 54 U/L   Alkaline Phosphatase 151 (H) 38 - 126 U/L   Total Bilirubin 0.3 0.3 - 1.2 mg/dL   GFR calc non Af Amer >60 >60 mL/min   GFR calc Af Amer >60 >60 mL/min    Comment: (NOTE) The eGFR has been calculated using the CKD EPI equation. This calculation has not been validated in all clinical situations. eGFR's persistently <60 mL/min signify possible Chronic Kidney Disease.    Anion gap 12 5 - 15    Comment: Performed  at [facility], [place] [address] [place], [place] [address]  Urine rapid drug screen (hosp performed)     Status: None   Collection Time: 04/11/17  9:00 PM  Result Value Ref Range   Opiates NONE DETECTED NONE DETECTED   Cocaine NONE DETECTED NONE DETECTED   Benzodiazepines NONE DETECTED NONE DETECTED   Amphetamines NONE DETECTED NONE DETECTED   Tetrahydrocannabinol NONE DETECTED NONE DETECTED   Barbiturates NONE DETECTED NONE DETECTED    Comment: (NOTE) DRUG SCREEN FOR MEDICAL PURPOSES ONLY.  IF CONFIRMATION IS NEEDED FOR ANY PURPOSE, NOTIFY LAB WITHIN 5 DAYS. LOWEST DETECTABLE LIMITS FOR URINE DRUG SCREEN Drug Class                     Cutoff (ng/mL) Amphetamine and metabolites    1000 Barbiturate and metabolites    200 Benzodiazepine                 093 Tricyclics and metabolites     300 Opiates and metabolites        300 Cocaine and metabolites        300 THC                            50 Performed at [facility], [place] [address] [place],  [address]   CBC with Diff     Status: Abnormal   Collection Time: 04/11/17  9:00 PM  Result Value Ref Range   WBC 4.7 4.0 - 10.5 K/uL   RBC 4.30 3.87 - 5.11 MIL/uL   Hemoglobin 11.0 (L) 12.0 - 15.0 g/dL   HCT 34.5 (L) 36.0 - 46.0 %   MCV 80.2 78.0 - 100.0 fL   MCH 25.6 (L) 26.0 - 34.0 pg   MCHC 31.9 30.0 - 36.0 g/dL   RDW 15.3 11.5 - 15.5 %   Platelets 119 (L) 150 - 400 K/uL    Comment: SPECIMEN CHECKED FOR CLOTS REPEATED TO VERIFY PLATELET COUNT CONFIRMED BY SMEAR    Neutrophils Relative % 62 %   Neutro Abs 2.9 1.7 -  7.7 K/uL   Lymphocytes Relative 31 %   Lymphs Abs 1.5 0.7 - 4.0 K/uL   Monocytes Relative 4 %   Monocytes Absolute 0.2 0.1 - 1.0 K/uL   Eosinophils Relative 3 %   Eosinophils Absolute 0.1 0.0 - 0.7 K/uL   Basophils Relative 0 %   Basophils Absolute 0.0 0.0 - 0.1 K/uL    Comment: Performed at [facility], [place] [address] [place], [place] [address]   I-Stat beta hCG blood, ED     Status: None   Collection Time: 04/11/17  9:18 PM  Result Value Ref Range   I-stat hCG, quantitative <5.0 <5 mIU/mL   Comment 3            Comment:   GEST. AGE      CONC.  (mIU/mL)   <=1 WEEK        5 - 50     2 WEEKS       50 - 500     3 WEEKS       100 - 10,000     4 WEEKS     1,000 - 30,000        FEMALE AND NON-PREGNANT FEMALE:     LESS THAN 5 mIU/mL   CBG monitoring, ED     Status: Abnormal   Collection Time: 04/11/17 11:18 PM  Result Value Ref Range   Glucose-Capillary 366 (H) 65 - 99 mg/dL   Comment 1 Notify RN   CBG monitoring, ED     Status: Abnormal   Collection Time: 04/12/17  7:38 AM  Result Value Ref Range   Glucose-Capillary 365 (H) 65 - 99 mg/dL    Current Facility-Administered Medications  Medication Dose Route Frequency Provider Last Rate Last Dose  . busPIRone (BUSPAR) tablet 30 mg  30 mg Oral BID Blanchie Dessert, MD   30 mg at 04/11/17 2354  . chlorproMAZINE (THORAZINE) tablet 50 mg  50 mg Oral TID Blanchie Dessert, MD   50 mg at 04/11/17 2354  . cloNIDine (CATAPRES) tablet 0.1 mg  0.1 mg Oral BID Blanchie Dessert, MD   0.1 mg at 04/11/17 2354  . hydrOXYzine (ATARAX/VISTARIL) tablet 50 mg  50 mg Oral TID PRN Blanchie Dessert, MD      . insulin aspart (novoLOG) injection 6 Units  6 Units Subcutaneous TID WC Blanchie Dessert, MD   6 Units at 04/12/17 0740  . insulin detemir (LEVEMIR) injection 68 Units  68 Units Subcutaneous BID Blanchie Dessert, MD   68 Units at 04/11/17 2352  . levETIRAcetam (KEPPRA) tablet 500 mg  500 mg Oral BID Blanchie Dessert, MD   500 mg at 04/11/17 2353  . lisinopril (PRINIVIL,ZESTRIL) tablet 10 mg  10 mg Oral Daily Blanchie Dessert, MD   10 mg at 04/11/17 2353  . pantoprazole (PROTONIX) EC tablet 40 mg  40 mg Oral Daily Blanchie Dessert, MD   40 mg at 04/11/17 2353  . QUEtiapine (SEROQUEL XR) 24 hr tablet 500 mg  500 mg Oral QHS Blanchie Dessert, MD   500 mg at 04/11/17 2352  . sertraline  (ZOLOFT) tablet 200 mg  200 mg Oral Daily Blanchie Dessert, MD   200 mg at 04/11/17 2358  . simvastatin (ZOCOR) tablet 10 mg  10 mg Oral QHS Blanchie Dessert, MD   10 mg at 04/11/17 2353  . topiramate (TOPAMAX) tablet 100 mg  100 mg Oral BID Blanchie Dessert, MD   100 mg at 04/11/17 2355  . traZODone (DESYREL)  tablet 50 mg  50 mg Oral QHS Blanchie Dessert, MD   50 mg at 04/11/17 2354   Current Outpatient Medications  Medication Sig Dispense Refill  . busPIRone (BUSPAR) 30 MG tablet Take 30 mg by mouth 2 (two) times daily.    . chlorproMAZINE (THORAZINE) 50 MG tablet Take 1 tablet (50 mg total) by mouth 3 (three) times daily. For agitation/mood control 90 tablet 0  . cloNIDine (CATAPRES) 0.1 MG tablet Take 1 tablet (0.1 mg total) by mouth 2 (two) times daily. For high blood pressure 12 tablet 0  . Dulaglutide (TRULICITY) 1.5 DJ/5.7SV SOPN Inject 1.5 mg into the skin every Friday. For diabetes management    . gabapentin (NEURONTIN) 400 MG capsule Take 1 capsule (400 mg total) by mouth 3 (three) times daily. For agitation/diabetic neuropathy 90 capsule 0  . insulin aspart (NOVOLOG) 100 UNIT/ML injection Inject 6 Units into the skin 3 (three) times daily with meals. For diabetes management 10 mL 0  . insulin detemir (LEVEMIR) 100 UNIT/ML injection Inject 0.68 mLs (68 Units total) into the skin 2 (two) times daily. For diabetes management 10 mL 0  . levETIRAcetam (KEPPRA) 500 MG tablet Take 1 tablet (500 mg total) by mouth 2 (two) times daily. For mood stabilization 60 tablet 0  . lisinopril (PRINIVIL,ZESTRIL) 10 MG tablet Take 1 tablet (10 mg total) by mouth daily. For high blood pressure 6 tablet 0  . pantoprazole (PROTONIX) 40 MG tablet Take 1 tablet (40 mg total) by mouth daily. For acid reflux 6 tablet 0  . QUEtiapine (SEROQUEL XR) 200 MG 24 hr tablet Take 500 mg by mouth at bedtime.    Marland Kitchen QUEtiapine (SEROQUEL) 100 MG tablet Take 5 tablets (500 mg total) by mouth at bedtime. For mood control  (Patient taking differently: Take 100 mg by mouth daily. ) 150 tablet 0  . sertraline (ZOLOFT) 100 MG tablet Take 200 mg by mouth daily.    . simvastatin (ZOCOR) 10 MG tablet Take 10 mg by mouth at bedtime.    . topiramate (TOPAMAX) 100 MG tablet Take 100 mg by mouth 2 (two) times daily.    . traZODone (DESYREL) 50 MG tablet Take 50 mg by mouth at bedtime.    . benzonatate (TESSALON) 100 MG capsule Take 1 capsule (100 mg total) by mouth 3 (three) times daily as needed for cough. 21 capsule 0  . cyclobenzaprine (FLEXERIL) 5 MG tablet Take 1 tablet (5 mg total) by mouth 2 (two) times daily as needed for muscle spasms. 10 tablet 0  . dicyclomine (BENTYL) 20 MG tablet Take 1 tablet (20 mg total) by mouth every 8 (eight) hours as needed for spasms. (Patient not taking: Reported on 04/11/2017)    . fenofibrate 160 MG tablet Take 1 tablet (160 mg total) by mouth daily. For high Cholesterol (Patient not taking: Reported on 04/11/2017) 7 tablet 0  . hydrOXYzine (ATARAX/VISTARIL) 50 MG tablet Take 1 tablet (50 mg total) by mouth 3 (three) times daily as needed for anxiety. 60 tablet 0  . magnesium oxide (MAG-OX) 400 MG tablet Take 1 tablet (400 mg total) by mouth daily. (Patient not taking: Reported on 04/11/2017) 30 tablet 0  . QUEtiapine (SEROQUEL) 50 MG tablet Take 1 tablet (50 mg total) by mouth 2 (two) times daily. For agitation/mood control (Patient not taking: Reported on 04/11/2017) 60 tablet 0  . sodium bicarbonate 650 MG tablet Take 1 tablet (650 mg total) by mouth daily. (Patient not taking: Reported on 04/11/2017) 30  tablet 0    Musculoskeletal: Strength & Muscle Tone: within normal limits Gait & Station: normal Patient leans: N/A  Psychiatric Specialty Exam: Physical Exam  Nursing note and vitals reviewed. Constitutional: She is oriented to person, place, and time. She appears well-developed and well-nourished.  HENT:  Head: Normocephalic and atraumatic.  Neck: Normal range of motion.   Respiratory: Effort normal.  Musculoskeletal: Normal range of motion.  Neurological: She is alert and oriented to person, place, and time.  Psychiatric: She has a normal mood and affect. Her speech is normal and behavior is normal. Judgment and thought content normal. Cognition and memory are normal.    Review of Systems  Psychiatric/Behavioral: The patient is nervous/anxious.   All other systems reviewed and are negative.   Blood pressure (!) 108/51, pulse 80, temperature 98.3 F (36.8 C), temperature source Oral, resp. rate 18, height 5' 4"  (1.626 m), weight 103 kg (227 lb), last menstrual period 04/04/2017, SpO2 96 %.Body mass index is 38.96 kg/m.  General Appearance: Casual  Eye Contact:  Good  Speech:  Normal Rate  Volume:  Normal  Mood:  Anxious, mild  Affect:  Congruent  Thought Process:  Coherent and Descriptions of Associations: Intact  Orientation:  Full (Time, Place, and Person)  Thought Content:  WDL and Logical  Suicidal Thoughts:  No  Homicidal Thoughts:  No  Memory:  Immediate;   Good Recent;   Good Remote;   Good  Judgement:  Fair  Insight:  Fair  Psychomotor Activity:  Normal  Concentration:  Concentration: Good and Attention Span: Good  Recall:  Good  Fund of Knowledge:  Fair  Language:  Good  Akathisia:  No  Handed:  Right  AIMS (if indicated):   N/A  Assets:  Housing Leisure Time Physical Health Resilience Social Support  ADL's:  Intact  Cognition:  WNL  Sleep:   N/A     Treatment Plan Summary: Daily contact with patient to assess and evaluate symptoms and progress in treatment, Medication management and Plan schizoaffective disorder, bipolar type:  -Crisis stabilization -Medication management:  Continued medical medications along with Zoloft 200 mg daily for depression, Thorazine 50 mg TID for psychosis, Buspar 30 mg BID for anxiety, Vistaril 50 mg TID PRN anxiety, Trazodone 50 mg at bedtime for sleep, and Seroquel 500 mg at bedtime for sleep  and mood -Individual counseling  Disposition: No evidence of imminent risk to self or others at present.    Waylan Boga, NP 04/12/2017 8:54 AM   Patient seen face-to-face for psychiatric evaluation, chart reviewed and case discussed with the physician extender and developed treatment plan. Reviewed the information documented and agree with the treatment plan.  Buford Dresser, DO 04/12/17 7:02 PM

## 2017-04-25 ENCOUNTER — Encounter (HOSPITAL_COMMUNITY): Payer: Self-pay

## 2017-04-25 DIAGNOSIS — Z9049 Acquired absence of other specified parts of digestive tract: Secondary | ICD-10-CM | POA: Insufficient documentation

## 2017-04-25 DIAGNOSIS — Z794 Long term (current) use of insulin: Secondary | ICD-10-CM | POA: Diagnosis not present

## 2017-04-25 DIAGNOSIS — F319 Bipolar disorder, unspecified: Secondary | ICD-10-CM | POA: Diagnosis not present

## 2017-04-25 DIAGNOSIS — F419 Anxiety disorder, unspecified: Secondary | ICD-10-CM | POA: Insufficient documentation

## 2017-04-25 DIAGNOSIS — E1165 Type 2 diabetes mellitus with hyperglycemia: Secondary | ICD-10-CM | POA: Diagnosis not present

## 2017-04-25 DIAGNOSIS — Z79899 Other long term (current) drug therapy: Secondary | ICD-10-CM | POA: Diagnosis not present

## 2017-04-25 DIAGNOSIS — R1032 Left lower quadrant pain: Secondary | ICD-10-CM | POA: Diagnosis present

## 2017-04-25 DIAGNOSIS — Z859 Personal history of malignant neoplasm, unspecified: Secondary | ICD-10-CM | POA: Insufficient documentation

## 2017-04-25 DIAGNOSIS — R1084 Generalized abdominal pain: Secondary | ICD-10-CM | POA: Insufficient documentation

## 2017-04-25 DIAGNOSIS — F209 Schizophrenia, unspecified: Secondary | ICD-10-CM | POA: Insufficient documentation

## 2017-04-25 LAB — URINALYSIS, ROUTINE W REFLEX MICROSCOPIC
BILIRUBIN URINE: NEGATIVE
Glucose, UA: >=500 mg/dL — AB
KETONES UR: NEGATIVE mg/dL
NITRITE: NEGATIVE
Protein, ur: NEGATIVE mg/dL
Specific Gravity, Urine: 1.035 — ABNORMAL HIGH (ref 1.005–1.030)
pH: 6 (ref 5.0–8.0)

## 2017-04-25 LAB — I-STAT BETA HCG BLOOD, ED (MC, WL, AP ONLY)

## 2017-04-25 LAB — BASIC METABOLIC PANEL
ANION GAP: 11 (ref 5–15)
BUN: 5 mg/dL — ABNORMAL LOW (ref 6–20)
CHLORIDE: 97 mmol/L — AB (ref 101–111)
CO2: 25 mmol/L (ref 22–32)
Calcium: 8.6 mg/dL — ABNORMAL LOW (ref 8.9–10.3)
Creatinine, Ser: 0.44 mg/dL (ref 0.44–1.00)
GFR calc Af Amer: >60 mL/min (ref >60)
GLUCOSE: 460 mg/dL — AB (ref 65–99)
POTASSIUM: 3.8 mmol/L (ref 3.5–5.1)
Sodium: 133 mmol/L — ABNORMAL LOW (ref 135–145)

## 2017-04-25 LAB — CBC
HEMATOCRIT: 36.5 % (ref 36.0–46.0)
HEMOGLOBIN: 11.5 g/dL — AB (ref 12.0–15.0)
MCH: 25.6 pg — ABNORMAL LOW (ref 26.0–34.0)
MCHC: 31.5 g/dL (ref 30.0–36.0)
MCV: 81.3 fL (ref 78.0–100.0)
Platelets: 127 10*3/uL — ABNORMAL LOW (ref 150–400)
RBC: 4.49 MIL/uL (ref 3.87–5.11)
RDW: 15.5 % (ref 11.5–15.5)
WBC: 5.2 10*3/uL (ref 4.0–10.5)

## 2017-04-25 LAB — CBG MONITORING, ED: Glucose-Capillary: 437 mg/dL — ABNORMAL HIGH (ref 65–99)

## 2017-04-25 NOTE — ED Triage Notes (Signed)
BIB EMS w/ c/o abd pain, hyperglycemia (572  CBG), and nausea. EMS administered 4mg  Zofran PIV. Pt A+OX4, NAD.   EMS Vitals  BP 142/97 P 114 EKG - ST SPO2 94% RR 16

## 2017-04-26 ENCOUNTER — Encounter (HOSPITAL_COMMUNITY): Payer: Self-pay

## 2017-04-26 ENCOUNTER — Emergency Department (HOSPITAL_COMMUNITY): Payer: Medicare HMO

## 2017-04-26 ENCOUNTER — Emergency Department (HOSPITAL_COMMUNITY)
Admission: EM | Admit: 2017-04-26 | Discharge: 2017-04-26 | Disposition: A | Discharge status: Home / Self Care | Payer: Medicare HMO | Attending: Emergency Medicine | Admitting: Emergency Medicine

## 2017-04-26 DIAGNOSIS — R1084 Generalized abdominal pain: Secondary | ICD-10-CM

## 2017-04-26 DIAGNOSIS — R739 Hyperglycemia, unspecified: Secondary | ICD-10-CM

## 2017-04-26 LAB — CBG MONITORING, ED
Glucose-Capillary: 478 mg/dL — ABNORMAL HIGH (ref 65–99)
Glucose-Capillary: 329 mg/dL — ABNORMAL HIGH (ref 65–99)

## 2017-04-26 MED ORDER — INSULIN ASPART 100 UNIT/ML ~~LOC~~ SOLN
10.0000 [IU] | Freq: Once | SUBCUTANEOUS | Status: AC
Start: 2017-04-26 — End: 2017-04-26
  Administered 2017-04-26: 10 [IU] via SUBCUTANEOUS
  Filled 2017-04-26: qty 1

## 2017-04-26 MED ORDER — IOPAMIDOL (ISOVUE-300) INJECTION 61%
INTRAVENOUS | Status: AC
  Filled 2017-04-26: qty 100

## 2017-04-26 MED ORDER — MORPHINE SULFATE (PF) 4 MG/ML IV SOLN
4.0000 mg | Freq: Once | INTRAVENOUS | Status: AC
  Administered 2017-04-26: 4 mg via INTRAVENOUS
  Filled 2017-04-26: qty 1

## 2017-04-26 MED ORDER — GLUCOSE BLOOD VI STRP: ORAL_STRIP | 12 refills | Status: DC

## 2017-04-26 MED ORDER — SODIUM CHLORIDE 0.9 % IV BOLUS
1000.0000 mL | Freq: Once | INTRAVENOUS | Status: AC
  Administered 2017-04-26: 1000 mL via INTRAVENOUS

## 2017-04-26 MED ORDER — ONDANSETRON HCL 4 MG/2ML IJ SOLN
4.0000 mg | Freq: Once | INTRAMUSCULAR | Status: AC
  Administered 2017-04-26: 4 mg via INTRAVENOUS
  Filled 2017-04-26: qty 2

## 2017-04-26 MED ORDER — IOPAMIDOL (ISOVUE-300) INJECTION 61%
100.0000 mL | Freq: Once | INTRAVENOUS | Status: AC | PRN
  Administered 2017-04-26: 100 mL via INTRAVENOUS

## 2017-04-26 NOTE — ED Provider Notes (Signed)
[facility] DEPT Provider Note   CSN: [id number] Arrival date & time: 04/25/17  1707     History   Chief Complaint Chief Complaint  Patient presents with  . Abdominal Pain  . Hyperglycemia    HPI Tina Saunders is a 32 y.o. female.  Patient presents to the emergency department with a chief complaint of abdominal pain.  She states that her blood sugar has been running high as well.  States that her symptoms started yesterday.  She reports associated nausea and vomiting, and also states that she has diarrhea.  She states that the majority of her pain is in her left lower quadrant.  She denies any dysuria or new or unusual vaginal discharge.  She has not taken anything for symptoms.  There are no modifying factors.  She rates her pain is moderate.  The history is provided by the patient. No language interpreter was used.    Past Medical History:  Diagnosis Date  . Anxiety   . Bipolar 1 disorder ([place])   . Cancer of abdominal wall   . Depression   . Diabetes mellitus without complication ([place])   . Hypertension   . Obesity   . Obesity   . Polycystic ovarian syndrome 07/01/2011   Patient report  . 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 Schizophrenia [facility])     Patient Active Problem List   Diagnosis Date Noted  . DKA (diabetic ketoacidoses) ([place]) 03/14/2017  . Chronic constipation   . Seizures ([place]) 12/11/2016  . Acute lower UTI 12/11/2016  . DM2 (diabetes mellitus, type 2) ([place]) 12/11/2016  . Uncontrolled diabetes mellitus ([place]) 11/03/2016  . Schizoaffective disorder, bipolar type ([place]) 11/02/2016  . Cluster B personality disorder ([place]) 11/02/2016  . Schizoaffective disorder, mixed type ([place]) 05/30/2014  . Suicidal ideation   . 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 Headache 04/15/2013  . HTN (hypertension) 04/15/2013  . Post traumatic stress disorder 12/07/2011  . CAP (community acquired pneumonia) 08/27/2011  . Chest pain 08/26/2011  . SOB (shortness  of breath) 08/26/2011  . Fever 08/26/2011  . Hypokalemia 08/26/2011  . PSVT (paroxysmal supraventricular tachycardia) ([place]) 08/26/2011  . ADHD 09/23/2007  . EPIGASTRIC PAIN 09/23/2007  . Obesity, unspecified 07/30/2007  . Depression 07/30/2007  . SLEEP DISORDER 07/30/2007  . IMPAIRED FASTING GLUCOSE 07/30/2007  . FATIGUE 11/21/2006  . ABNORMAL FINDINGS, ELEVATED BP W/O HTN 11/21/2006  . METRORRHAGIA 06/13/2006  . DISORDER, MENSTRUAL NEC 06/13/2006  . DIZZINESS 06/13/2006  . POLYCYSTIC OVARIAN DISEASE 04/25/2006  . AMENORRHEA, SECONDARY 04/20/2006  . ACNE, MILD 04/20/2006  . Abdominal pain 04/20/2006    Past Surgical History:  Procedure Laterality Date  . CHOLECYSTECTOMY    . COLONOSCOPY WITH PROPOFOL N/A 03/08/2017   Procedure: COLONOSCOPY WITH PROPOFOL;  Surgeon: Milus Banister, MD;  Location: [facility] ENDOSCOPY;  Service: Endoscopy;  Laterality: N/A;  . HERNIA REPAIR    . Ovarian Cyst Excision    . VARICOSE VEIN SURGERY       OB History    Gravida  0   Para      Term      Preterm      AB      Living        SAB      TAB      Ectopic      Multiple      Live Births               Home Medications  Prior to Admission medications   Medication Sig Start Date End Date Taking? Authorizing Provider  benzonatate (TESSALON) 100 MG capsule Take 1 capsule (100 mg total) by mouth 3 (three) times daily as needed for cough. 03/30/17   Orpah Greek, MD  busPIRone (BUSPAR) 30 MG tablet Take 30 mg by mouth 2 (two) times daily.    [provider]  chlorproMAZINE (THORAZINE) 50 MG tablet Take 1 tablet (50 mg total) by mouth 3 (three) times daily. For agitation/mood control 02/09/17   Lindell Spar I, NP  cloNIDine (CATAPRES) 0.1 MG tablet Take 1 tablet (0.1 mg total) by mouth 2 (two) times daily. For high blood pressure 02/09/17   Nwoko, Herbert Pun I, NP  cyclobenzaprine (FLEXERIL) 5 MG tablet Take 1 tablet (5 mg total) by mouth 2 (two) times daily as needed for  muscle spasms. 03/16/17   Florencia Reasons, MD  dicyclomine (BENTYL) 20 MG tablet Take 1 tablet (20 mg total) by mouth every 8 (eight) hours as needed for spasms. Patient not taking: Reported on 04/11/2017 02/09/17   Lindell Spar I, NP  Dulaglutide (TRULICITY) 1.5 WU/9.8JX SOPN Inject 1.5 mg into the skin every Friday. For diabetes management 02/09/17   Lindell Spar I, NP  fenofibrate 160 MG tablet Take 1 tablet (160 mg total) by mouth daily. For high Cholesterol Patient not taking: Reported on 04/11/2017 02/10/17   Lindell Spar I, NP  gabapentin (NEURONTIN) 400 MG capsule Take 1 capsule (400 mg total) by mouth 3 (three) times daily. For agitation/diabetic neuropathy 02/09/17   Lindell Spar I, NP  hydrOXYzine (ATARAX/VISTARIL) 50 MG tablet Take 1 tablet (50 mg total) by mouth 3 (three) times daily as needed for anxiety. 02/09/17   Lindell Spar I, NP  insulin aspart (NOVOLOG) 100 UNIT/ML injection Inject 6 Units into the skin 3 (three) times daily with meals. For diabetes management 02/09/17   Lindell Spar I, NP  insulin detemir (LEVEMIR) 100 UNIT/ML injection Inject 0.68 mLs (68 Units total) into the skin 2 (two) times daily. For diabetes management 03/16/17   Florencia Reasons, MD  levETIRAcetam (KEPPRA) 500 MG tablet Take 1 tablet (500 mg total) by mouth 2 (two) times daily. For mood stabilization 02/09/17   Lindell Spar I, NP  lisinopril (PRINIVIL,ZESTRIL) 10 MG tablet Take 1 tablet (10 mg total) by mouth daily. For high blood pressure 02/09/17   Lindell Spar I, NP  magnesium oxide (MAG-OX) 400 MG tablet Take 1 tablet (400 mg total) by mouth daily. Patient not taking: Reported on 04/11/2017 03/16/17   Florencia Reasons, MD  pantoprazole (PROTONIX) 40 MG tablet Take 1 tablet (40 mg total) by mouth daily. For acid reflux 02/09/17   Lindell Spar I, NP  QUEtiapine (SEROQUEL) 100 MG tablet Take 5 tablets (500 mg total) by mouth at bedtime. For mood control Patient taking differently: Take 100 mg by mouth daily.  02/09/17   Lindell Spar I, NP    QUEtiapine (SEROQUEL) 50 MG tablet Take 1 tablet (50 mg total) by mouth 2 (two) times daily. For agitation/mood control Patient not taking: Reported on 04/11/2017 02/09/17   Lindell Spar I, NP  sertraline (ZOLOFT) 100 MG tablet Take 200 mg by mouth daily.    [provider]  simvastatin (ZOCOR) 10 MG tablet Take 10 mg by mouth at bedtime.    [provider]  sodium bicarbonate 650 MG tablet Take 1 tablet (650 mg total) by mouth daily. Patient not taking: Reported on 04/11/2017 03/16/17   Florencia Reasons, MD  topiramate (TOPAMAX) 100 MG tablet Take 100 mg by mouth 2 (two) times daily.    [provider]  traZODone (DESYREL) 50 MG tablet Take 50 mg by mouth at bedtime.    [provider]    Family History Family History  Problem Relation Age of Onset  . Coronary artery disease Maternal Grandmother   . Diabetes type II Maternal Grandmother   . Cancer Maternal Grandmother   . Hypertension Mother   . Hypertension Father     Social History Social History   Tobacco Use  . Smoking status: Never Smoker  . Smokeless tobacco: Never Used  Substance Use Topics  . Alcohol use: No  . Drug use: No     Allergies   Fish-derived products; Geodon [ziprasidone hcl]; Haldol [haloperidol lactate]; Buprenorphine hcl; Compazine [prochlorperazine]; Morphine and related; and Toradol [ketorolac tromethamine]   Review of Systems Review of Systems  All other systems reviewed and are negative.    Physical Exam Updated Vital Signs BP (!) 144/93 (BP Location: Left Arm)   Pulse (!) 104   Temp (!) 97.5 F (36.4 C) (Oral)   Resp 16   LMP 04/04/2017   SpO2 96%   Physical Exam  Constitutional: She is oriented to person, place, and time. She appears well-developed and well-nourished.  HENT:  Head: Normocephalic and atraumatic.  Eyes: Pupils are equal, round, and reactive to light. Conjunctivae and EOM are normal.  Neck: Normal range of motion. Neck supple.   Cardiovascular: Normal rate and regular rhythm. Exam reveals no gallop and no friction rub.  No murmur heard. Pulmonary/Chest: Effort normal and breath sounds normal. No respiratory distress. She has no wheezes. She has no rales. She exhibits no tenderness.  Abdominal: Soft. Bowel sounds are normal. She exhibits no distension and no mass. There is tenderness in the left lower quadrant. There is no rebound and no guarding.  Musculoskeletal: Normal range of motion. She exhibits no edema or tenderness.  Neurological: She is alert and oriented to person, place, and time.  Skin: Skin is warm and dry.  Psychiatric: She has a normal mood and affect. Her behavior is normal. Judgment and thought content normal.  Nursing note and vitals reviewed.    ED Treatments / Results  Labs (all labs ordered are listed, but only abnormal results are displayed) Labs Reviewed  BASIC METABOLIC PANEL - Abnormal; Notable for the following components:      Result Value   Sodium 133 (*)    Chloride 97 (*)    Glucose, Bld 460 (*)    BUN 5 (*)    Calcium 8.6 (*)    All other components within normal limits  CBC - Abnormal; Notable for the following components:   Hemoglobin 11.5 (*)    MCH 25.6 (*)    Platelets 127 (*)    All other components within normal limits  URINALYSIS, ROUTINE W REFLEX MICROSCOPIC - Abnormal; Notable for the following components:   Specific Gravity, Urine 1.035 (*)    Glucose, UA >=500 (*)    Hgb urine dipstick SMALL (*)    Leukocytes, UA MODERATE (*)    Bacteria, UA RARE (*)    Squamous Epithelial / LPF 6-30 (*)    All other components within normal limits  CBG MONITORING, ED - Abnormal; Notable for the following components:   Glucose-Capillary 437 (*)    All other components within normal limits  I-STAT BETA HCG BLOOD, ED ([facility], [facility], [facility] ONLY)  CBG MONITORING, ED  EKG None  Radiology Ct Abdomen Pelvis W Contrast  Result Date: 04/26/2017 CLINICAL DATA:  Left lower quadrant  pain and diarrhea EXAM: CT ABDOMEN AND PELVIS WITH CONTRAST TECHNIQUE: Multidetector CT imaging of the abdomen and pelvis was performed using the standard protocol following bolus administration of intravenous contrast. CONTRAST:  150mL ISOVUE-300 IOPAMIDOL (ISOVUE-300) INJECTION 61% COMPARISON:  CT abdomen pelvis 03/13/2017 FINDINGS: LOWER CHEST: No basilar pulmonary nodules or pleural effusion. No apical pericardial effusion. HEPATOBILIARY: Hepatomegaly with suspected hepatic steatosis. Status post cholecystectomy. No focal liver lesion. No biliary dilatation. PANCREAS: Normal parenchymal contours without ductal dilatation. No peripancreatic fluid collection. SPLEEN: Unchanged splenomegaly, measuring 20 cm. ADRENALS/URINARY TRACT: --Adrenal glands: Normal. --Right kidney/ureter: No hydronephrosis, nephroureterolithiasis, perinephric stranding or solid renal mass. --Left kidney/ureter: No hydronephrosis, nephroureterolithiasis, perinephric stranding or solid renal mass. --Urinary bladder: Normal for degree of distention STOMACH/BOWEL: --Stomach/Duodenum: No hiatal hernia or other gastric abnormality. Normal duodenal course. --Small bowel: No dilatation or inflammation. --Colon: No focal abnormality. --Appendix: Normal. VASCULAR/LYMPHATIC: Normal course and caliber of the major abdominal vessels. No abdominal or pelvic lymphadenopathy. REPRODUCTIVE: Normal uterus and ovaries. MUSCULOSKELETAL. No bony spinal canal stenosis or focal osseous abnormality. OTHER: None. IMPRESSION: 1. No acute abdominal or pelvic abnormality. 2. Massive hepatosplenomegaly and hepatic steatosis. Electronically Signed   By: Ulyses Jarred M.D.   On: 04/26/2017 02:41    Procedures Procedures (including critical care time)  Medications Ordered in ED Medications  sodium chloride 0.9 % bolus 1,000 mL (has no administration in time range)  ondansetron (ZOFRAN) injection 4 mg (has no administration in time range)  morphine 4 MG/ML  injection 4 mg (has no administration in time range)     Initial Impression / Assessment and Plan / ED Course  I have reviewed the triage vital signs and the nursing notes.  Pertinent labs & imaging results that were available during my care of the patient were reviewed by me and considered in my medical decision making (see chart for details).    Patient with left lower quadrant tenderness and hyperglycemia.  Will treat with fluids.  Will recheck CBG.  CBG trending down.  Recommend continuing home insulin.  CT is negative.  Likely viral etiology.  No evidence of acute abdomen.  Final Clinical Impressions(s) / ED Diagnoses   Final diagnoses:  Generalized abdominal pain  Hyperglycemia    ED Discharge Orders        Ordered    glucose blood test strip     04/26/17 0335       Montine Circle, PA-C 85/46/27 0350    Delora Fuel, MD 09/38/18 [phone]

## 2017-05-17 DIAGNOSIS — E1310 Other specified diabetes mellitus with ketoacidosis without coma: Secondary | ICD-10-CM | POA: Diagnosis not present

## 2017-05-17 DIAGNOSIS — R945 Abnormal results of liver function studies: Secondary | ICD-10-CM | POA: Diagnosis not present

## 2017-05-17 DIAGNOSIS — R10823 Right lower quadrant rebound abdominal tenderness: Secondary | ICD-10-CM

## 2017-05-17 DIAGNOSIS — E871 Hypo-osmolality and hyponatremia: Secondary | ICD-10-CM

## 2017-05-17 DIAGNOSIS — Z8659 Personal history of other mental and behavioral disorders: Secondary | ICD-10-CM

## 2017-05-18 DIAGNOSIS — R197 Diarrhea, unspecified: Secondary | ICD-10-CM

## 2017-05-18 DIAGNOSIS — F319 Bipolar disorder, unspecified: Secondary | ICD-10-CM | POA: Diagnosis not present

## 2017-05-18 DIAGNOSIS — E669 Obesity, unspecified: Secondary | ICD-10-CM | POA: Diagnosis not present

## 2017-05-18 DIAGNOSIS — R109 Unspecified abdominal pain: Secondary | ICD-10-CM

## 2017-05-18 DIAGNOSIS — E119 Type 2 diabetes mellitus without complications: Secondary | ICD-10-CM | POA: Diagnosis not present

## 2017-05-18 DIAGNOSIS — F259 Schizoaffective disorder, unspecified: Secondary | ICD-10-CM | POA: Diagnosis not present

## 2017-05-18 DIAGNOSIS — E1310 Other specified diabetes mellitus with ketoacidosis without coma: Secondary | ICD-10-CM | POA: Diagnosis not present

## 2017-05-18 DIAGNOSIS — F418 Other specified anxiety disorders: Secondary | ICD-10-CM | POA: Diagnosis not present

## 2017-05-18 DIAGNOSIS — F329 Major depressive disorder, single episode, unspecified: Secondary | ICD-10-CM | POA: Diagnosis not present

## 2017-05-26 ENCOUNTER — Emergency Department (HOSPITAL_COMMUNITY)
Admission: EM | Admit: 2017-05-26 | Discharge: 2017-05-26 | Disposition: A | Discharge status: Home / Self Care | Payer: Medicare HMO | Attending: Emergency Medicine | Admitting: Emergency Medicine

## 2017-05-26 ENCOUNTER — Other Ambulatory Visit: Payer: Self-pay

## 2017-05-26 ENCOUNTER — Encounter (HOSPITAL_COMMUNITY): Payer: Self-pay

## 2017-05-26 DIAGNOSIS — E1165 Type 2 diabetes mellitus with hyperglycemia: Secondary | ICD-10-CM | POA: Insufficient documentation

## 2017-05-26 DIAGNOSIS — G8929 Other chronic pain: Secondary | ICD-10-CM

## 2017-05-26 DIAGNOSIS — Z8589 Personal history of malignant neoplasm of other organs and systems: Secondary | ICD-10-CM | POA: Insufficient documentation

## 2017-05-26 DIAGNOSIS — F209 Schizophrenia, unspecified: Secondary | ICD-10-CM | POA: Diagnosis not present

## 2017-05-26 DIAGNOSIS — Z794 Long term (current) use of insulin: Secondary | ICD-10-CM | POA: Insufficient documentation

## 2017-05-26 DIAGNOSIS — I10 Essential (primary) hypertension: Secondary | ICD-10-CM | POA: Diagnosis not present

## 2017-05-26 DIAGNOSIS — F319 Bipolar disorder, unspecified: Secondary | ICD-10-CM | POA: Diagnosis not present

## 2017-05-26 DIAGNOSIS — D696 Thrombocytopenia, unspecified: Secondary | ICD-10-CM | POA: Diagnosis not present

## 2017-05-26 DIAGNOSIS — A084 Viral intestinal infection, unspecified: Secondary | ICD-10-CM | POA: Insufficient documentation

## 2017-05-26 DIAGNOSIS — Z79899 Other long term (current) drug therapy: Secondary | ICD-10-CM | POA: Insufficient documentation

## 2017-05-26 DIAGNOSIS — F419 Anxiety disorder, unspecified: Secondary | ICD-10-CM | POA: Diagnosis not present

## 2017-05-26 DIAGNOSIS — R1030 Lower abdominal pain, unspecified: Secondary | ICD-10-CM | POA: Diagnosis not present

## 2017-05-26 DIAGNOSIS — D649 Anemia, unspecified: Secondary | ICD-10-CM | POA: Diagnosis not present

## 2017-05-26 DIAGNOSIS — R109 Unspecified abdominal pain: Secondary | ICD-10-CM | POA: Diagnosis not present

## 2017-05-26 DIAGNOSIS — R197 Diarrhea, unspecified: Secondary | ICD-10-CM | POA: Insufficient documentation

## 2017-05-26 DIAGNOSIS — Z9049 Acquired absence of other specified parts of digestive tract: Secondary | ICD-10-CM | POA: Insufficient documentation

## 2017-05-26 DIAGNOSIS — R739 Hyperglycemia, unspecified: Secondary | ICD-10-CM | POA: Diagnosis present

## 2017-05-26 DIAGNOSIS — R110 Nausea: Secondary | ICD-10-CM | POA: Diagnosis not present

## 2017-05-26 LAB — COMPREHENSIVE METABOLIC PANEL
ALT: 26 U/L (ref 14–54)
AST: 34 U/L (ref 15–41)
Albumin: 3.7 g/dL (ref 3.5–5.0)
Alkaline Phosphatase: 104 U/L (ref 38–126)
Anion gap: 10 (ref 5–15)
BILIRUBIN TOTAL: 0.4 mg/dL (ref 0.3–1.2)
BUN: 9 mg/dL (ref 6–20)
CO2: 21 mmol/L — ABNORMAL LOW (ref 22–32)
Calcium: 9.1 mg/dL (ref 8.9–10.3)
Chloride: 104 mmol/L (ref 101–111)
Creatinine, Ser: 0.46 mg/dL (ref 0.44–1.00)
Glucose, Bld: 428 mg/dL — ABNORMAL HIGH (ref 65–99)
POTASSIUM: 3.7 mmol/L (ref 3.5–5.1)
Sodium: 135 mmol/L (ref 135–145)
Total Protein: 7.5 g/dL (ref 6.5–8.1)

## 2017-05-26 LAB — CBC WITH DIFFERENTIAL/PLATELET
BASOS ABS: 0 10*3/uL (ref 0.0–0.1)
Basophils Relative: 0 %
EOS ABS: 0.2 10*3/uL (ref 0.0–0.7)
EOS PCT: 3 %
HEMATOCRIT: 34.4 % — AB (ref 36.0–46.0)
Hemoglobin: 10.9 g/dL — ABNORMAL LOW (ref 12.0–15.0)
LYMPHS ABS: 1.1 10*3/uL (ref 0.7–4.0)
LYMPHS PCT: 22 %
MCH: 25.2 pg — AB (ref 26.0–34.0)
MCHC: 31.7 g/dL (ref 30.0–36.0)
MCV: 79.6 fL (ref 78.0–100.0)
MONO ABS: 0.3 10*3/uL (ref 0.1–1.0)
MONOS PCT: 5 %
Neutro Abs: 3.5 10*3/uL (ref 1.7–7.7)
Neutrophils Relative %: 70 %
Platelets: 120 10*3/uL — ABNORMAL LOW (ref 150–400)
RBC: 4.32 MIL/uL (ref 3.87–5.11)
RDW: 15.2 % (ref 11.5–15.5)
WBC: 5.1 10*3/uL (ref 4.0–10.5)

## 2017-05-26 LAB — BLOOD GAS, VENOUS
Acid-base deficit: 3.1 mmol/L — ABNORMAL HIGH (ref 0.0–2.0)
Bicarbonate: 21.1 mmol/L (ref 20.0–28.0)
O2 SAT: 91.9 %
PCO2 VEN: 36.7 mmHg — AB (ref 44.0–60.0)
Patient temperature: 98.6
pH, Ven: 7.377 (ref 7.250–7.430)
pO2, Ven: 69.1 mmHg — ABNORMAL HIGH (ref 32.0–45.0)

## 2017-05-26 LAB — URINALYSIS, ROUTINE W REFLEX MICROSCOPIC
Bilirubin Urine: NEGATIVE
Bilirubin Urine: NEGATIVE
Glucose, UA: >=500 mg/dL — AB
KETONES UR: NEGATIVE mg/dL
Ketones, ur: NEGATIVE mg/dL
NITRITE: NEGATIVE
Nitrite: NEGATIVE
PROTEIN: NEGATIVE mg/dL
PROTEIN: NEGATIVE mg/dL
SPECIFIC GRAVITY, URINE: 1.01 (ref 1.005–1.030)
Specific Gravity, Urine: 1.032 — ABNORMAL HIGH (ref 1.005–1.030)
pH: 6 (ref 5.0–8.0)
pH: 6 (ref 5.0–8.0)

## 2017-05-26 LAB — MAGNESIUM: MAGNESIUM: 1.6 mg/dL — AB (ref 1.7–2.4)

## 2017-05-26 LAB — CBG MONITORING, ED
GLUCOSE-CAPILLARY: 221 mg/dL — AB (ref 65–99)
Glucose-Capillary: 492 mg/dL — ABNORMAL HIGH (ref 65–99)

## 2017-05-26 LAB — LIPASE, BLOOD: LIPASE: 32 U/L (ref 11–51)

## 2017-05-26 LAB — I-STAT BETA HCG BLOOD, ED (MC, WL, AP ONLY): I-stat hCG, quantitative: <5 m[IU]/mL (ref <5)

## 2017-05-26 MED ORDER — FENTANYL CITRATE (PF) 100 MCG/2ML IJ SOLN
50.0000 ug | Freq: Once | INTRAMUSCULAR | Status: AC
Start: 2017-05-26 — End: 2017-05-26
  Administered 2017-05-26: 50 ug via INTRAVENOUS
  Filled 2017-05-26: qty 2

## 2017-05-26 MED ORDER — DICYCLOMINE HCL 20 MG PO TABS: 20.0000 mg | ORAL_TABLET | Freq: Three times a day (TID) | ORAL | 0 refills | Status: DC | PRN

## 2017-05-26 MED ORDER — ONDANSETRON HCL 4 MG/2ML IJ SOLN
4.0000 mg | Freq: Once | INTRAMUSCULAR | Status: AC
  Administered 2017-05-26: 4 mg via INTRAVENOUS
  Filled 2017-05-26: qty 2

## 2017-05-26 MED ORDER — INSULIN ASPART 100 UNIT/ML ~~LOC~~ SOLN
10.0000 [IU] | Freq: Once | SUBCUTANEOUS | Status: AC
Start: 2017-05-26 — End: 2017-05-26
  Administered 2017-05-26: 10 [IU] via SUBCUTANEOUS
  Filled 2017-05-26: qty 1

## 2017-05-26 MED ORDER — SODIUM CHLORIDE 0.9 % IV BOLUS
2000.0000 mL | Freq: Once | INTRAVENOUS | Status: AC
  Administered 2017-05-26: 2000 mL via INTRAVENOUS

## 2017-05-26 MED ORDER — ONDANSETRON 4 MG PO TBDP: 4.0000 mg | ORAL_TABLET | Freq: Three times a day (TID) | ORAL | 0 refills | Status: DC | PRN

## 2017-05-26 NOTE — ED Triage Notes (Signed)
Pt arrived via EMS from home. Pt reports that she has been checking BS at home and has been above 600 for the past few days consistently per EMS with RT lower abd pain and nausea and diarrhea. Pt denies vomiting. Pt reports that she was hospitalized last thur for hyperglycemia. Insulin last taken at noon today. Per EMS pt has not f/u with PCP d/t not being able to get there.  Pt given 500 cc  CBG 472 BP 152/60, HR 100, RR 97% RA RR 20 20G arm.

## 2017-05-26 NOTE — ED Provider Notes (Signed)
[facility] DEPT Provider Note   CSN: [id number] Arrival date & time: 05/26/17  1349     History   Chief Complaint Chief Complaint  Patient presents with  . Hyperglycemia    HPI Tina Saunders is a 32 y.o. female with a PMHx of DM2, HTN, PCOS, obesity, pSVT, chronic abdominal pain, chronic hepatosplenomegaly and fatty liver infiltration, seizures, bipolar 1 disorder, and other conditions listed below, and PSHx of cholecystectomy and "ovarian cyst drainages", who presents to the ED with complaints of 3 days of sugars that are >600 on her home meter, as well as RLQ pain, nausea, and diarrhea.  Patient states that she has been checking her sugar and is always reading greater than 600 for the last 3 days, states that it does not improve when she takes her home insulin which includes NovoLog 22 units with meals and 68 units of Levemir twice daily.  She took NovoLog around 12 PM today, and Levemir around 9 AM.  She states that she ate cereal this morning around 9 AM, but she cannot recall everything that she has been eating recently while her sugars have been elevated, but she states that eating seems to worsen her sugars; she cannot give me specifics on how her sugars respond when she eats or what foods she is eating.  She describes her pain as 8/10 constant sharp nonradiating RLQ pain that worsens with activity or movement, and has been unrelieved with Tylenol and ibuprofen.  She reports associated nausea and 4-5 episodes daily of nonbloody watery diarrhea.  She states that all of her symptoms are similar to prior visits when she has had elevated CBGs.  Of note, chart review reveals that she was seen in the ER on 04/26/17 for similar complaints, CTAP was negative for any acute findings, her sugars came down with fluids and 10U subQ Novolog, and she was eventually discharged home.  She's had 7 CTs of her abdomen since 08/2016 with 4 being in 2019, none of which have ever found  a reason for her RLQ pain (they always find stable hepatosplenomegaly and fatty liver infiltration which is always unchanged).  Her PCP is Dr. Alphonzo Grieve.   She denies fevers, chills, CP, SOB, vomiting, constipation, obstipation, melena, hematochezia, hematuria, dysuria, vaginal bleeding/discharge, myalgias, arthralgias, numbness, tingling, focal weakness, or any other complaints at this time. Denies recent travel, sick contacts, suspicious food intake, EtOH use, or frequent NSAID use.  Of note, she denies allergy to morphine despite it being listed in her chart as an allergy; she confirms being allergic to toradol stating that it gives her hives and trouble breathing (although she's had it a handful of times in 2014/2015).  She is very interested in knowing what she will be receiving for pain.   The history is provided by medical records and the patient. No language interpreter was used.    Past Medical History:  Diagnosis Date  . Anxiety   . Bipolar 1 disorder ([place])   . Cancer of abdominal wall   . Depression   . Diabetes mellitus without complication ([place])   . Hypertension   . Obesity   . Obesity   . Polycystic ovarian syndrome 07/01/2011   Patient report  . 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 Schizophrenia [facility])     Patient Active Problem List   Diagnosis Date Noted  . DKA (diabetic ketoacidoses) ([place]) 03/14/2017  . Chronic constipation   . Seizures ([place]) 12/11/2016  . Acute lower UTI 12/11/2016  .  DM2 (diabetes mellitus, type 2) ([place]) 12/11/2016  . Uncontrolled diabetes mellitus ([place]) 11/03/2016  . Schizoaffective disorder, bipolar type ([place]) 11/02/2016  . 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 Schizoaffective disorder, mixed type ([place]) 05/30/2014  . Suicidal ideation   . 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 Headache 04/15/2013  . HTN (hypertension) 04/15/2013  . Post traumatic stress disorder 12/07/2011  . CAP (community acquired pneumonia) 08/27/2011  . Chest pain  08/26/2011  . SOB (shortness of breath) 08/26/2011  . Fever 08/26/2011  . Hypokalemia 08/26/2011  . PSVT (paroxysmal supraventricular tachycardia) ([place]) 08/26/2011  . ADHD 09/23/2007  . EPIGASTRIC PAIN 09/23/2007  . Obesity, unspecified 07/30/2007  . Depression 07/30/2007  . SLEEP DISORDER 07/30/2007  . IMPAIRED FASTING GLUCOSE 07/30/2007  . FATIGUE 11/21/2006  . ABNORMAL FINDINGS, ELEVATED BP W/O HTN 11/21/2006  . METRORRHAGIA 06/13/2006  . DISORDER, MENSTRUAL NEC 06/13/2006  . DIZZINESS 06/13/2006  . POLYCYSTIC OVARIAN DISEASE 04/25/2006  . AMENORRHEA, SECONDARY 04/20/2006  . ACNE, MILD 04/20/2006  . Abdominal pain 04/20/2006    Past Surgical History:  Procedure Laterality Date  . CHOLECYSTECTOMY    . COLONOSCOPY WITH PROPOFOL N/A 03/08/2017   Procedure: COLONOSCOPY WITH PROPOFOL;  Surgeon: Milus Banister, MD;  Location: [facility] ENDOSCOPY;  Service: Endoscopy;  Laterality: N/A;  . HERNIA REPAIR    . Ovarian Cyst Excision    . VARICOSE VEIN SURGERY       OB History    Gravida  0   Para      Term      Preterm      AB      Living        SAB      TAB      Ectopic      Multiple      Live Births               Home Medications    Prior to Admission medications   Medication Sig Start Date End Date Taking? Authorizing Provider  benzonatate (TESSALON) 100 MG capsule Take 1 capsule (100 mg total) by mouth 3 (three) times daily as needed for cough. Patient not taking: Reported on 04/26/2017 03/30/17   Orpah Greek, MD  busPIRone (BUSPAR) 30 MG tablet Take 15 mg by mouth 2 (two) times daily.     [provider]  chlorproMAZINE (THORAZINE) 50 MG tablet Take 1 tablet (50 mg total) by mouth 3 (three) times daily. For agitation/mood control 02/09/17   Lindell Spar I, NP  cloNIDine (CATAPRES) 0.1 MG tablet Take 1 tablet (0.1 mg total) by mouth 2 (two) times daily. For high blood pressure 02/09/17   Nwoko, Herbert Pun I, NP  cyclobenzaprine (FLEXERIL) 5 MG  tablet Take 1 tablet (5 mg total) by mouth 2 (two) times daily as needed for muscle spasms. Patient not taking: Reported on 04/26/2017 03/16/17   Florencia Reasons, MD  dicyclomine (BENTYL) 20 MG tablet Take 1 tablet (20 mg total) by mouth every 8 (eight) hours as needed for spasms. Patient not taking: Reported on 04/11/2017 02/09/17   Lindell Spar I, NP  Dulaglutide (TRULICITY) 1.5 JJ/8.8CZ SOPN Inject 1.5 mg into the skin every Friday. For diabetes management 02/09/17   Lindell Spar I, NP  fenofibrate 160 MG tablet Take 1 tablet (160 mg total) by mouth daily. For high Cholesterol Patient not taking: Reported on 04/11/2017 02/10/17   Lindell Spar I, NP  gabapentin (NEURONTIN) 400 MG capsule Take 1 capsule (400 mg  total) by mouth 3 (three) times daily. For agitation/diabetic neuropathy 02/09/17   Lindell Spar I, NP  glucose blood test strip Use as instructed 04/26/17   Montine Circle, PA-C  hydrOXYzine (ATARAX/VISTARIL) 50 MG tablet Take 1 tablet (50 mg total) by mouth 3 (three) times daily as needed for anxiety. Patient not taking: Reported on 04/26/2017 02/09/17   Lindell Spar I, NP  insulin aspart (NOVOLOG) 100 UNIT/ML injection Inject 6 Units into the skin 3 (three) times daily with meals. For diabetes management 02/09/17   Lindell Spar I, NP  insulin detemir (LEVEMIR) 100 UNIT/ML injection Inject 0.68 mLs (68 Units total) into the skin 2 (two) times daily. For diabetes management 03/16/17   Florencia Reasons, MD  levETIRAcetam (KEPPRA) 500 MG tablet Take 1 tablet (500 mg total) by mouth 2 (two) times daily. For mood stabilization 02/09/17   Lindell Spar I, NP  lisinopril (PRINIVIL,ZESTRIL) 10 MG tablet Take 1 tablet (10 mg total) by mouth daily. For high blood pressure 02/09/17   Lindell Spar I, NP  magnesium oxide (MAG-OX) 400 MG tablet Take 1 tablet (400 mg total) by mouth daily. Patient not taking: Reported on 04/11/2017 03/16/17   Florencia Reasons, MD  pantoprazole (PROTONIX) 40 MG tablet Take 1 tablet (40 mg total) by mouth daily.  For acid reflux Patient not taking: Reported on 04/26/2017 02/09/17   Lindell Spar I, NP  QUEtiapine (SEROQUEL XR) 200 MG 24 hr tablet Take 500 mg by mouth at bedtime.    [provider]  QUEtiapine (SEROQUEL) 100 MG tablet Take 5 tablets (500 mg total) by mouth at bedtime. For mood control Patient taking differently: Take 100 mg by mouth daily.  02/09/17   Lindell Spar I, NP  QUEtiapine (SEROQUEL) 50 MG tablet Take 1 tablet (50 mg total) by mouth 2 (two) times daily. For agitation/mood control Patient not taking: Reported on 04/26/2017 02/09/17   Lindell Spar I, NP  sertraline (ZOLOFT) 100 MG tablet Take 200 mg by mouth daily.    [provider]  simvastatin (ZOCOR) 10 MG tablet Take 10 mg by mouth at bedtime.    [provider]  sodium bicarbonate 650 MG tablet Take 1 tablet (650 mg total) by mouth daily. Patient not taking: Reported on 04/11/2017 03/16/17   Florencia Reasons, MD  topiramate (TOPAMAX) 100 MG tablet Take 100 mg by mouth 2 (two) times daily.    [provider]  traZODone (DESYREL) 50 MG tablet Take 50 mg by mouth at bedtime.    [provider]    Family History Family History  Problem Relation Age of Onset  . Coronary artery disease Maternal Grandmother   . Diabetes type II Maternal Grandmother   . Cancer Maternal Grandmother   . Hypertension Mother   . Hypertension Father     Social History Social History   Tobacco Use  . Smoking status: Never Smoker  . Smokeless tobacco: Never Used  Substance Use Topics  . Alcohol use: No  . Drug use: No     Allergies   Fish-derived products; Geodon [ziprasidone hcl]; Haldol [haloperidol lactate]; Buprenorphine hcl; Compazine [prochlorperazine]; Morphine and related; and Toradol [ketorolac tromethamine]   Review of Systems Review of Systems  Constitutional: Negative for chills and fever.  Respiratory: Negative for shortness of breath.   Cardiovascular: Negative for chest pain.    Gastrointestinal: Positive for abdominal pain, diarrhea and nausea. Negative for blood in stool, constipation and vomiting.  Genitourinary: Negative for dysuria, hematuria, vaginal bleeding and vaginal  discharge.  Musculoskeletal: Negative for arthralgias and myalgias.  Skin: Negative for color change.  Allergic/Immunologic: Positive for immunocompromised state (DM2).  Neurological: Negative for weakness and numbness.  Psychiatric/Behavioral: Negative for confusion.   All other systems reviewed and are negative for acute change except as noted in the HPI.    Physical Exam Updated Vital Signs BP 131/87   Pulse (!) 111   Temp 97.7 F (36.5 C)   Resp (!) 22   SpO2 97%   Physical Exam  Constitutional: She is oriented to person, place, and time. Vital signs are normal. She appears well-developed and well-nourished.  Non-toxic appearance. No distress.  Afebrile, nontoxic, NAD, resting comfortably in bed when I enter the room  HENT:  Head: Normocephalic and atraumatic.  Mouth/Throat: Oropharynx is clear and moist and mucous membranes are normal.  Eyes: Conjunctivae and EOM are normal. Right eye exhibits no discharge. Left eye exhibits no discharge.  Neck: Normal range of motion. Neck supple.  Cardiovascular: Regular rhythm, normal heart sounds and intact distal pulses. Tachycardia present. Exam reveals no gallop and no friction rub.  No murmur heard. HR 100-105 during exam which is consistent with prior visits; reg rhythm, nl s1/s2, no m/r/g, distal pulses intact  Pulmonary/Chest: Effort normal and breath sounds normal. No respiratory distress. She has no decreased breath sounds. She has no wheezes. She has no rhonchi. She has no rales.  No tachypnea on exam  Abdominal: Soft. Normal appearance and bowel sounds are normal. She exhibits no distension. There is tenderness in the right lower quadrant and suprapubic area. There is no rigidity, no rebound, no guarding, no CVA tenderness, no  tenderness at McBurney's point and negative Murphy's sign.  Soft, obese but not overtly distended, +BS throughout, with very mild suprapubic and RLQ TTP but this seems to disappear when pt is distracted, no r/g/r, neg murphy's, no focal mcburney's point tenderness, no CVA TTP   Musculoskeletal: Normal range of motion.  Neurological: She is alert and oriented to person, place, and time. She has normal strength. No sensory deficit.  Skin: Skin is warm, dry and intact. No rash noted.  Psychiatric: Her affect is blunt.  Flat affect, doesn't really make eye contact until she asks what pain medication she is receiving.   Nursing note and vitals reviewed.    ED Treatments / Results  Labs (all labs ordered are listed, but only abnormal results are displayed) Labs Reviewed  URINALYSIS, ROUTINE W REFLEX MICROSCOPIC - Abnormal; Notable for the following components:      Result Value   APPearance HAZY (*)    Specific Gravity, Urine 1.032 (*)    Glucose, UA >=500 (*)    Hgb urine dipstick MODERATE (*)    Leukocytes, UA MODERATE (*)    Bacteria, UA FEW (*)    All other components within normal limits  CBC WITH DIFFERENTIAL/PLATELET - Abnormal; Notable for the following components:   Hemoglobin 10.9 (*)    HCT 34.4 (*)    MCH 25.2 (*)    Platelets 120 (*)    All other components within normal limits  COMPREHENSIVE METABOLIC PANEL - Abnormal; Notable for the following components:   CO2 21 (*)    Glucose, Bld 428 (*)    All other components within normal limits  MAGNESIUM - Abnormal; Notable for the following components:   Magnesium 1.6 (*)    All other components within normal limits  BLOOD GAS, VENOUS - Abnormal; Notable for the following components:   pCO2,  Ven 36.7 (*)    pO2, Ven 69.1 (*)    Acid-base deficit 3.1 (*)    All other components within normal limits  URINALYSIS, ROUTINE W REFLEX MICROSCOPIC - Abnormal; Notable for the following components:   APPearance CLOUDY (*)     Glucose, UA >=500 (*)    Hgb urine dipstick MODERATE (*)    Leukocytes, UA SMALL (*)    All other components within normal limits  CBG MONITORING, ED - Abnormal; Notable for the following components:   Glucose-Capillary 492 (*)    All other components within normal limits  CBG MONITORING, ED - Abnormal; Notable for the following components:   Glucose-Capillary 221 (*)    All other components within normal limits  LIPASE, BLOOD  I-STAT BETA HCG BLOOD, ED ([facility], [facility], [facility] ONLY)    EKG None  Radiology No results found.  Procedures Procedures (including critical care time)  Medications Ordered in ED Medications  sodium chloride 0.9 % bolus 2,000 mL (0 mLs Intravenous Stopped 05/26/17 1754)  ondansetron (ZOFRAN) injection 4 mg (4 mg Intravenous Given 05/26/17 1507)  fentaNYL (SUBLIMAZE) injection 50 mcg (50 mcg Intravenous Given 05/26/17 1507)  insulin aspart (novoLOG) injection 10 Units (10 Units Subcutaneous Given 05/26/17 1506)     Initial Impression / Assessment and Plan / ED Course  I have reviewed the triage vital signs and the nursing notes.  Pertinent labs & imaging results that were available during my care of the patient were reviewed by me and considered in my medical decision making (see chart for details).     32 y.o. female here with hyperglycemia x3 days with associated RLQ pain, nausea, and diarrhea. Has had multiple visits in the past with similar complaints, including a visit on 04/26/17 (had CTAP which was negative). She reports compliance with meds but she's not very sure on what foods she's eating and how this could be contributing to her issues. On exam, mildly tachycardic in the low 100s which is consistent with prior visits, appears comfortable, flat affect, very mild suprapubic/RLQ TTP but that seems to disappear when she's distracted, nonperitoneal and no focal Mcburney's tenderness. Will get labs but hold off on imaging as she's had multiple CTs in the past (7 since  09/21/16, and 4 in 2019) for these same complaints which have never found an acute etiology of her pain (only shows chronic stable hepatosplenomegaly with fatty liver infiltration). I doubt appendicitis at this time. Will reassess after labs and decide on if we need to do further diagnostics, but for now I feel it's reasonable to hold off on doing any imaging. Doubt need for pelvic exam at this time as well. Will give fluids and insulin as well as pain/nausea meds and reassess shortly.   7:44 PM CBC w/diff with chronic stable anemia similar to prior visits, and with mild chronic stable thrombocytopenia also similar to prior visits; no leukocytosis which is reassuring and makes appendicitis much less likely. CMP with gluc 428, bicarb mildly low at 21 but no anion gap, doubt DKA. Lipase WNL. Mg level just barely low at 1.6, doubt need for repletion at this time. VBG reassuring without acidosis. U/A grossly contaminated with 6-10 squamous cells, no nitrites, +leuks but only 0-5 WBCs and few bacteria (per lab staff, this was verbally reported due to machine error), not convincing evidence of UTI given this contaminated specimen; also without ketones which also confirms that DKA is doubtful. BetaHCG neg. Repeat CBG 221 after fluids/insulin.  Pt feeling overall  better (using her laptop and in NAD on recheck, despite stating that her pain is still present, admits it's improved from prior) and tolerating PO well here; requesting a sandwich and drinking a diet sprite. HR also improving with fluids.  Overall, symptoms most consistent with possible viral gastroenteritis, and could be somewhat due to her chronic issues and her hyperglycemia, vs IBS (although she'd need further GI eval to ensure no other etiology of her chronic abd pain and diarrhea). I doubt she's truly being compliant like she states she is, or perhaps she's not following a low-carb diet to try to help her sugars be better controlled. Long discussion had  with her regarding importance of diet modifications in addition to medication compliance. For diarrhea, advised BRAT diet and adequate hydration, will rx zofran and bentyl to help with symptoms, advised other OTC remedies for symptomatic relief as well. Advised f/up with PCP in 1wk for recheck of symptoms and ongoing management of her chronic conditions. I explained the diagnosis and have given explicit precautions to return to the ER including for any other new or worsening symptoms. The patient understands and accepts the medical plan as it's been dictated and I have answered their questions. Discharge instructions concerning home care and prescriptions have been given. The patient is STABLE and is discharged to home in good condition.    Final Clinical Impressions(s) / ED Diagnoses   Final diagnoses:  Type 2 diabetes mellitus with hyperglycemia, with long-term current use of insulin (HCC)  Chronic abdominal pain  Nausea  Diarrhea, unspecified type  Chronic anemia  Thrombocytopenia (HCC)  Viral gastroenteritis    ED Discharge Orders        Ordered    ondansetron (ZOFRAN ODT) 4 MG disintegrating tablet  Every 8 hours PRN     05/26/17 1912    dicyclomine (BENTYL) 20 MG tablet  3 times daily with meals PRN     05/26/17 [address] [place], [place] 05/26/17 1945    Duffy Bruce, MD 05/27/17 [phone]

## 2017-05-26 NOTE — Discharge Instructions (Addendum)
Your sugars were high today, but you are not in DKA. You must take your insulin exactly how you're instructed to do so, and you also need to monitor your carbohydrate intake in order to avoid having issues with your sugars. EAT A LOW CARBOHYDRATE DIET, which is outlined below.   As for your abdominal pain and nausea/diarrhea, it's possible that you have a viral gastroenteritis illness, or some of it could be from your chronic medical conditions. Use zofran as prescribed, as needed for nausea. Use bentyl as directed as needed for abdominal pain and diarrhea. Alternate between tylenol and motrin as needed for pain. May consider using over the counter tums, maalox, pepto bismol, or other over the counter remedies to help with symptoms. Stay well hydrated with small sips of fluids throughout the day. Follow a BRAT (banana-rice-applesauce-toast) diet as described below for the next 24-48 hours. The 'BRAT' diet is suggested, then progress to diet as tolerated as symptoms abate. Call your regular doctor if bloody stools, persistent diarrhea, vomiting, fever or abdominal pain.   Follow up with your regular doctor in 1 week for recheck of symptoms and ongoing management of your chronic medical conditions. Return to ER for changing or worsening of symptoms.

## 2017-05-26 NOTE — ED Notes (Signed)
Bed: WA02 Expected date: 05/26/17 Expected time: 1:46 PM Means of arrival: Ambulance Comments: Hyperglycemia

## 2017-06-01 ENCOUNTER — Emergency Department (HOSPITAL_COMMUNITY)
Admission: EM | Admit: 2017-06-01 | Discharge: 2017-06-01 | Discharge status: Against Medical Advice | Payer: Medicare HMO | Attending: Emergency Medicine | Admitting: Emergency Medicine

## 2017-06-01 DIAGNOSIS — D649 Anemia, unspecified: Secondary | ICD-10-CM | POA: Insufficient documentation

## 2017-06-01 DIAGNOSIS — E1165 Type 2 diabetes mellitus with hyperglycemia: Secondary | ICD-10-CM | POA: Insufficient documentation

## 2017-06-01 DIAGNOSIS — Z79899 Other long term (current) drug therapy: Secondary | ICD-10-CM | POA: Diagnosis not present

## 2017-06-01 DIAGNOSIS — R112 Nausea with vomiting, unspecified: Secondary | ICD-10-CM

## 2017-06-01 DIAGNOSIS — R1010 Upper abdominal pain, unspecified: Secondary | ICD-10-CM

## 2017-06-01 DIAGNOSIS — R739 Hyperglycemia, unspecified: Secondary | ICD-10-CM | POA: Diagnosis not present

## 2017-06-01 DIAGNOSIS — I10 Essential (primary) hypertension: Secondary | ICD-10-CM | POA: Insufficient documentation

## 2017-06-01 DIAGNOSIS — N390 Urinary tract infection, site not specified: Secondary | ICD-10-CM | POA: Diagnosis not present

## 2017-06-01 DIAGNOSIS — N898 Other specified noninflammatory disorders of vagina: Secondary | ICD-10-CM | POA: Diagnosis not present

## 2017-06-01 DIAGNOSIS — Z794 Long term (current) use of insulin: Secondary | ICD-10-CM | POA: Diagnosis not present

## 2017-06-01 DIAGNOSIS — A084 Viral intestinal infection, unspecified: Secondary | ICD-10-CM | POA: Diagnosis not present

## 2017-06-01 DIAGNOSIS — D696 Thrombocytopenia, unspecified: Secondary | ICD-10-CM | POA: Insufficient documentation

## 2017-06-01 DIAGNOSIS — R197 Diarrhea, unspecified: Secondary | ICD-10-CM

## 2017-06-01 DIAGNOSIS — E1065 Type 1 diabetes mellitus with hyperglycemia: Secondary | ICD-10-CM | POA: Diagnosis not present

## 2017-06-01 LAB — CBC WITH DIFFERENTIAL/PLATELET
Basophils Absolute: 0 10*3/uL (ref 0.0–0.1)
Basophils Relative: 0 %
EOS ABS: 0.1 10*3/uL (ref 0.0–0.7)
Eosinophils Relative: 3 %
HCT: 35.6 % — ABNORMAL LOW (ref 36.0–46.0)
Hemoglobin: 11.2 g/dL — ABNORMAL LOW (ref 12.0–15.0)
Lymphocytes Relative: 21 %
Lymphs Abs: 1 10*3/uL (ref 0.7–4.0)
MCH: 25.1 pg — ABNORMAL LOW (ref 26.0–34.0)
MCHC: 31.5 g/dL (ref 30.0–36.0)
MCV: 79.6 fL (ref 78.0–100.0)
MONO ABS: 0.2 10*3/uL (ref 0.1–1.0)
Monocytes Relative: 5 %
NEUTROS PCT: 71 %
Neutro Abs: 3.3 10*3/uL (ref 1.7–7.7)
PLATELETS: 101 10*3/uL — AB (ref 150–400)
RBC: 4.47 MIL/uL (ref 3.87–5.11)
RDW: 15.5 % (ref 11.5–15.5)
WBC: 4.6 10*3/uL (ref 4.0–10.5)

## 2017-06-01 LAB — COMPREHENSIVE METABOLIC PANEL
ALT: 32 U/L (ref 14–54)
AST: 39 U/L (ref 15–41)
Albumin: 4 g/dL (ref 3.5–5.0)
Alkaline Phosphatase: 130 U/L — ABNORMAL HIGH (ref 38–126)
Anion gap: 14 (ref 5–15)
BUN: 5 mg/dL — AB (ref 6–20)
CHLORIDE: 97 mmol/L — AB (ref 101–111)
CO2: 21 mmol/L — ABNORMAL LOW (ref 22–32)
CREATININE: 0.47 mg/dL (ref 0.44–1.00)
Calcium: 9.1 mg/dL (ref 8.9–10.3)
GFR calc non Af Amer: >60 mL/min (ref >60)
GLUCOSE: 501 mg/dL — AB (ref 65–99)
Potassium: 4 mmol/L (ref 3.5–5.1)
SODIUM: 132 mmol/L — AB (ref 135–145)
Total Bilirubin: 0.4 mg/dL (ref 0.3–1.2)
Total Protein: 7.9 g/dL (ref 6.5–8.1)

## 2017-06-01 LAB — URINALYSIS, ROUTINE W REFLEX MICROSCOPIC
BILIRUBIN URINE: NEGATIVE
Glucose, UA: >=500 mg/dL — AB
KETONES UR: NEGATIVE mg/dL
Nitrite: NEGATIVE
PROTEIN: NEGATIVE mg/dL
Specific Gravity, Urine: 1.03 (ref 1.005–1.030)
pH: 6 (ref 5.0–8.0)

## 2017-06-01 LAB — I-STAT BETA HCG BLOOD, ED (MC, WL, AP ONLY)

## 2017-06-01 LAB — BLOOD GAS, VENOUS
ACID-BASE DEFICIT: 1.8 mmol/L (ref 0.0–2.0)
BICARBONATE: 22.8 mmol/L (ref 20.0–28.0)
O2 SAT: 83.9 %
Patient temperature: 98.6
pCO2, Ven: 40.5 mmHg — ABNORMAL LOW (ref 44.0–60.0)
pH, Ven: 7.368 (ref 7.250–7.430)
pO2, Ven: 52.6 mmHg — ABNORMAL HIGH (ref 32.0–45.0)

## 2017-06-01 LAB — MAGNESIUM: Magnesium: 1.5 mg/dL — ABNORMAL LOW (ref 1.7–2.4)

## 2017-06-01 LAB — CBG MONITORING, ED
Glucose-Capillary: 536 mg/dL (ref 65–99)
Glucose-Capillary: 313 mg/dL — ABNORMAL HIGH (ref 65–99)

## 2017-06-01 LAB — LIPASE, BLOOD: Lipase: 29 U/L (ref 11–51)

## 2017-06-01 MED ORDER — SODIUM CHLORIDE 0.9 % IV BOLUS
2000.0000 mL | Freq: Once | INTRAVENOUS | Status: AC
  Administered 2017-06-01: 2000 mL via INTRAVENOUS

## 2017-06-01 MED ORDER — INSULIN ASPART 100 UNIT/ML ~~LOC~~ SOLN
10.0000 [IU] | Freq: Once | SUBCUTANEOUS | Status: AC
  Administered 2017-06-01: 10 [IU] via SUBCUTANEOUS
  Filled 2017-06-01: qty 1

## 2017-06-01 MED ORDER — CEPHALEXIN 500 MG PO CAPS: ORAL_CAPSULE | ORAL | 0 refills | Status: DC

## 2017-06-01 MED ORDER — ONDANSETRON HCL 4 MG/2ML IJ SOLN
4.0000 mg | Freq: Once | INTRAMUSCULAR | Status: AC
  Administered 2017-06-01: 4 mg via INTRAVENOUS
  Filled 2017-06-01: qty 2

## 2017-06-01 MED ORDER — ONDANSETRON 4 MG PO TBDP: 4.0000 mg | ORAL_TABLET | Freq: Three times a day (TID) | ORAL | 0 refills | Status: DC | PRN

## 2017-06-01 MED ORDER — GI COCKTAIL ~~LOC~~
30.0000 mL | Freq: Once | ORAL | Status: AC
  Administered 2017-06-01: 30 mL via ORAL
  Filled 2017-06-01: qty 30

## 2017-06-01 MED ORDER — FAMOTIDINE IN NACL 20-0.9 MG/50ML-% IV SOLN
20.0000 mg | Freq: Once | INTRAVENOUS | Status: AC
  Administered 2017-06-01: 20 mg via INTRAVENOUS
  Filled 2017-06-01: qty 50

## 2017-06-01 MED ORDER — FLUCONAZOLE 150 MG PO TABS: 150.0000 mg | ORAL_TABLET | Freq: Once | ORAL | 0 refills | Status: AC

## 2017-06-01 MED ORDER — FLUCONAZOLE 150 MG PO TABS
150.0000 mg | ORAL_TABLET | Freq: Once | ORAL | Status: DC
  Filled 2017-06-01: qty 1

## 2017-06-01 NOTE — ED Provider Notes (Signed)
[facility] DEPT Provider Note   CSN: [id number] Arrival date & time: 06/01/17  1334     History   Chief Complaint Chief Complaint  Patient presents with  . Hyperglycemia    HPI Tina Saunders is a 32 y.o. female with a PMHx of chronic abd pain, DM2, HTN, PCOS, bipolar disorder, schizophrenia, and other conditions listed below, and PSHx of cholecystectomy, who presents to the ED with complaints of "not feeling well" beginning this morning.  Patient is a very vague historian, and does not make eye contact during evaluation, very difficult to get much history from her.  She states that she started having 8/10 constant sharp nonradiating upper abdominal pain this morning, that worsens with movement, and has been unrelieved with Tylenol and ibuprofen.  She also reports nausea and one episode of nonbloody nonbilious emesis.  Lastly she states that she has had 5 episodes of nonbloody watery diarrhea.  She states that her blood sugar was high just before she called the ambulance, she had taken 22 units of NovoLog at noon which was before the high reading, she is not sure what her blood sugar was before she took the NovoLog.  She has not eaten anything today because her belly was hurting.  She also mentions having some vaginal itching recently.  She denies fevers, chills, CP, SOB, constipation, obstipation, melena, hematochezia, hematemesis, hematuria, dysuria, vaginal bleeding/discharge, myalgias, arthralgias, numbness, tingling, focal weakness, or any other complaints at this time. Denies recent travel, sick contacts, suspicious food intake, EtOH use, or frequent NSAID use.  Of note, she has been seen in the ER many times for hyperglycemia, and there is question on whether she is truly compliant with her medications.  She was last seen by me on 05/26/17 for very similar complaints.   The history is provided by the patient and medical records. No language interpreter was used.   Hyperglycemia  Associated symptoms: abdominal pain, nausea and vomiting   Associated symptoms: no chest pain, no confusion, no dysuria, no fever, no shortness of breath and no weakness     Past Medical History:  Diagnosis Date  . Anxiety   . Bipolar 1 disorder ([place])   . Cancer of abdominal wall   . Depression   . Diabetes mellitus without complication ([place])   . Hypertension   . Obesity   . Obesity   . Polycystic ovarian syndrome 07/01/2011   Patient report  . 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 Schizophrenia [facility])     Patient Active Problem List   Diagnosis Date Noted  . DKA (diabetic ketoacidoses) ([place]) 03/14/2017  . Chronic constipation   . Seizures ([place]) 12/11/2016  . Acute lower UTI 12/11/2016  . DM2 (diabetes mellitus, type 2) ([place]) 12/11/2016  . Uncontrolled diabetes mellitus ([place]) 11/03/2016  . Schizoaffective disorder, bipolar type ([place]) 11/02/2016  . 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 Schizoaffective disorder, mixed type ([place]) 05/30/2014  . Suicidal ideation   . 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 Headache 04/15/2013  . HTN (hypertension) 04/15/2013  . Post traumatic stress disorder 12/07/2011  . CAP (community acquired pneumonia) 08/27/2011  . Chest pain 08/26/2011  . SOB (shortness of breath) 08/26/2011  . Fever 08/26/2011  . Hypokalemia 08/26/2011  . PSVT (paroxysmal supraventricular tachycardia) ([place]) 08/26/2011  . ADHD 09/23/2007  . EPIGASTRIC PAIN 09/23/2007  . Obesity, unspecified 07/30/2007  . Depression 07/30/2007  . SLEEP DISORDER 07/30/2007  . IMPAIRED FASTING GLUCOSE 07/30/2007  . FATIGUE  11/21/2006  . ABNORMAL FINDINGS, ELEVATED BP W/O HTN 11/21/2006  . METRORRHAGIA 06/13/2006  . DISORDER, MENSTRUAL NEC 06/13/2006  . DIZZINESS 06/13/2006  . POLYCYSTIC OVARIAN DISEASE 04/25/2006  . AMENORRHEA, SECONDARY 04/20/2006  . ACNE, MILD 04/20/2006  . Abdominal pain 04/20/2006    Past Surgical History:  Procedure Laterality Date  .  CHOLECYSTECTOMY    . COLONOSCOPY WITH PROPOFOL N/A 03/08/2017   Procedure: COLONOSCOPY WITH PROPOFOL;  Surgeon: Milus Banister, MD;  Location: [facility] ENDOSCOPY;  Service: Endoscopy;  Laterality: N/A;  . HERNIA REPAIR    . Ovarian Cyst Excision    . VARICOSE VEIN SURGERY       OB History    Gravida  0   Para      Term      Preterm      AB      Living        SAB      TAB      Ectopic      Multiple      Live Births               Home Medications    Prior to Admission medications   Medication Sig Start Date End Date Taking? Authorizing Provider  benzonatate (TESSALON) 100 MG capsule Take 1 capsule (100 mg total) by mouth 3 (three) times daily as needed for cough. Patient not taking: Reported on 04/26/2017 03/30/17   Orpah Greek, MD  busPIRone (BUSPAR) 30 MG tablet Take 15 mg by mouth 2 (two) times daily.     [provider]  chlorproMAZINE (THORAZINE) 50 MG tablet Take 1 tablet (50 mg total) by mouth 3 (three) times daily. For agitation/mood control 02/09/17   Lindell Spar I, NP  cloNIDine (CATAPRES) 0.1 MG tablet Take 1 tablet (0.1 mg total) by mouth 2 (two) times daily. For high blood pressure Patient not taking: Reported on 05/26/2017 02/09/17   Lindell Spar I, NP  cyclobenzaprine (FLEXERIL) 5 MG tablet Take 1 tablet (5 mg total) by mouth 2 (two) times daily as needed for muscle spasms. Patient not taking: Reported on 04/26/2017 03/16/17   Florencia Reasons, MD  dicyclomine (BENTYL) 20 MG tablet Take 1 tablet (20 mg total) by mouth every 8 (eight) hours as needed for spasms. Patient not taking: Reported on 04/11/2017 02/09/17   Lindell Spar I, NP  dicyclomine (BENTYL) 20 MG tablet Take 1 tablet (20 mg total) by mouth 3 (three) times daily with meals as needed for spasms (abdominal pain and/or diarrhea). 05/26/17   Iliya Spivack, [place], PA-C  Dulaglutide (TRULICITY) 1.5 ZO/1.0RU SOPN Inject 1.5 mg into the skin every Friday. For diabetes management Patient not taking:  Reported on 05/26/2017 02/09/17   Lindell Spar I, NP  fenofibrate 160 MG tablet Take 1 tablet (160 mg total) by mouth daily. For high Cholesterol Patient not taking: Reported on 04/11/2017 02/10/17   Lindell Spar I, NP  gabapentin (NEURONTIN) 400 MG capsule Take 1 capsule (400 mg total) by mouth 3 (three) times daily. For agitation/diabetic neuropathy 02/09/17   Lindell Spar I, NP  glucose blood test strip Use as instructed Patient not taking: Reported on 05/26/2017 04/26/17   Montine Circle, PA-C  hydrOXYzine (ATARAX/VISTARIL) 50 MG tablet Take 1 tablet (50 mg total) by mouth 3 (three) times daily as needed for anxiety. Patient not taking: Reported on 04/26/2017 02/09/17   Lindell Spar I, NP  insulin aspart (NOVOLOG) 100 UNIT/ML injection Inject 6 Units into the skin 3 (three) times  daily with meals. For diabetes management Patient taking differently: Inject 22 Units into the skin 3 (three) times daily with meals. For diabetes management 02/09/17   Lindell Spar I, NP  insulin detemir (LEVEMIR) 100 UNIT/ML injection Inject 0.68 mLs (68 Units total) into the skin 2 (two) times daily. For diabetes management 03/16/17   Florencia Reasons, MD  levETIRAcetam (KEPPRA) 500 MG tablet Take 1 tablet (500 mg total) by mouth 2 (two) times daily. For mood stabilization 02/09/17   Lindell Spar I, NP  lisinopril (PRINIVIL,ZESTRIL) 10 MG tablet Take 1 tablet (10 mg total) by mouth daily. For high blood pressure 02/09/17   Lindell Spar I, NP  magnesium oxide (MAG-OX) 400 MG tablet Take 1 tablet (400 mg total) by mouth daily. Patient not taking: Reported on 04/11/2017 03/16/17   Florencia Reasons, MD  ondansetron (ZOFRAN ODT) 4 MG disintegrating tablet Take 1 tablet (4 mg total) by mouth every 8 (eight) hours as needed for nausea or vomiting. 05/26/17   Matalyn Nawaz, PA-C  pantoprazole (PROTONIX) 40 MG tablet Take 1 tablet (40 mg total) by mouth daily. For acid reflux Patient not taking: Reported on 04/26/2017 02/09/17   Lindell Spar I, NP  QUEtiapine  (SEROQUEL XR) 200 MG 24 hr tablet Take 400 mg by mouth at bedtime.     [provider]  QUEtiapine (SEROQUEL) 100 MG tablet Take 5 tablets (500 mg total) by mouth at bedtime. For mood control Patient taking differently: Take 100 mg by mouth daily.  02/09/17   Lindell Spar I, NP  QUEtiapine (SEROQUEL) 50 MG tablet Take 1 tablet (50 mg total) by mouth 2 (two) times daily. For agitation/mood control Patient not taking: Reported on 04/26/2017 02/09/17   Lindell Spar I, NP  sertraline (ZOLOFT) 100 MG tablet Take 200 mg by mouth daily.    [provider]  simvastatin (ZOCOR) 10 MG tablet Take 10 mg by mouth at bedtime.    [provider]  sodium bicarbonate 650 MG tablet Take 1 tablet (650 mg total) by mouth daily. Patient not taking: Reported on 04/11/2017 03/16/17   Florencia Reasons, MD  topiramate (TOPAMAX) 100 MG tablet Take 100 mg by mouth 2 (two) times daily.    [provider]  traZODone (DESYREL) 50 MG tablet Take 50 mg by mouth at bedtime.    [provider]    Family History Family History  Problem Relation Age of Onset  . Coronary artery disease Maternal Grandmother   . Diabetes type II Maternal Grandmother   . Cancer Maternal Grandmother   . Hypertension Mother   . Hypertension Father     Social History Social History   Tobacco Use  . Smoking status: Never Smoker  . Smokeless tobacco: Never Used  Substance Use Topics  . Alcohol use: No  . Drug use: No     Allergies   Fish-derived products; Geodon [ziprasidone hcl]; Haldol [haloperidol lactate]; Buprenorphine hcl; Compazine [prochlorperazine]; Morphine and related; and Toradol [ketorolac tromethamine]   Review of Systems Review of Systems  Constitutional: Negative for chills and fever.  Respiratory: Negative for shortness of breath.   Cardiovascular: Negative for chest pain.  Gastrointestinal: Positive for abdominal pain, diarrhea, nausea and vomiting. Negative for blood in stool and  constipation.  Genitourinary: Positive for vaginal pain (itching). Negative for dysuria, hematuria, vaginal bleeding and vaginal discharge.  Musculoskeletal: Negative for arthralgias and myalgias.  Skin: Negative for color change.  Allergic/Immunologic: Positive for immunocompromised state (DM2).  Neurological: Negative for weakness and  numbness.  Psychiatric/Behavioral: Negative for confusion.   All other systems reviewed and are negative for acute change except as noted in the HPI.    Physical Exam Updated Vital Signs BP (!) 145/93 (BP Location: Right Arm)   Pulse (!) 105   Temp 98.9 F (37.2 C) (Oral)   Resp 18   SpO2 99%   Physical Exam  Constitutional: She is oriented to person, place, and time. Vital signs are normal. She appears well-developed and well-nourished.  Non-toxic appearance. No distress.  Afebrile, nontoxic, NAD, resting comfortably in bed using her laptop to watch food videos on Facebook when I enter the room  HENT:  Head: Normocephalic and atraumatic.  Mouth/Throat: Oropharynx is clear and moist and mucous membranes are normal.  Eyes: Conjunctivae and EOM are normal. Right eye exhibits no discharge. Left eye exhibits no discharge.  Neck: Normal range of motion. Neck supple.  Cardiovascular: Regular rhythm, normal heart sounds and intact distal pulses. Tachycardia present. Exam reveals no gallop and no friction rub.  No murmur heard. HR 100-105 during exam which is consistent with prior visits; reg rhythm, nl s1/s2, no m/r/g, distal pulses intact  Pulmonary/Chest: Effort normal and breath sounds normal. No respiratory distress. She has no decreased breath sounds. She has no wheezes. She has no rhonchi. She has no rales.  Abdominal: Soft. Normal appearance and bowel sounds are normal. She exhibits no distension. There is tenderness in the epigastric area and left upper quadrant. There is no rigidity, no rebound, no guarding, no CVA tenderness, no tenderness at  McBurney's point and negative Murphy's sign.  Soft, obese but not overtly distended, +BS throughout, with very mild epigastric and LUQ TTP but this seems to disappear when pt is distracted, no other areas of TTP elsewhere, no r/g/r, neg murphy's, neg mcburney's, no CVA TTP   Musculoskeletal: Normal range of motion.  Neurological: She is alert and oriented to person, place, and time. She has normal strength. No sensory deficit.  Skin: Skin is warm, dry and intact. No rash noted.  Psychiatric: Her affect is blunt.  Flat affect, doesn't really make eye contact  Nursing note and vitals reviewed.    ED Treatments / Results  Labs (all labs ordered are listed, but only abnormal results are displayed) Labs Reviewed  CBC WITH DIFFERENTIAL/PLATELET - Abnormal; Notable for the following components:      Result Value   Hemoglobin 11.2 (*)    HCT 35.6 (*)    MCH 25.1 (*)    Platelets 101 (*)    All other components within normal limits  COMPREHENSIVE METABOLIC PANEL - Abnormal; Notable for the following components:   Sodium 132 (*)    Chloride 97 (*)    CO2 21 (*)    Glucose, Bld 501 (*)    BUN 5 (*)    Alkaline Phosphatase 130 (*)    All other components within normal limits  URINALYSIS, ROUTINE W REFLEX MICROSCOPIC - Abnormal; Notable for the following components:   APPearance HAZY (*)    Glucose, UA >=500 (*)    Hgb urine dipstick LARGE (*)    Leukocytes, UA MODERATE (*)    Bacteria, UA RARE (*)    All other components within normal limits  MAGNESIUM - Abnormal; Notable for the following components:   Magnesium 1.5 (*)    All other components within normal limits  BLOOD GAS, VENOUS - Abnormal; Notable for the following components:   pCO2, Ven 40.5 (*)    pO2, Ven  52.6 (*)    All other components within normal limits  CBG MONITORING, ED - Abnormal; Notable for the following components:   Glucose-Capillary 536 (*)    All other components within normal limits  CBG MONITORING, ED -  Abnormal; Notable for the following components:   Glucose-Capillary 313 (*)    All other components within normal limits  URINE CULTURE  LIPASE, BLOOD  I-STAT BETA HCG BLOOD, ED ([facility], [facility], [facility] ONLY)    EKG None  Radiology No results found.  Procedures Procedures (including critical care time)  Medications Ordered in ED Medications  fluconazole (DIFLUCAN) tablet 150 mg (has no administration in time range)  sodium chloride 0.9 % bolus 2,000 mL (2,000 mLs Intravenous New Bag/Given 06/01/17 1526)  ondansetron (ZOFRAN) injection 4 mg (4 mg Intravenous Given 06/01/17 1527)  insulin aspart (novoLOG) injection 10 Units (10 Units Subcutaneous Given 06/01/17 1526)  famotidine (PEPCID) IVPB 20 mg premix (0 mg Intravenous Stopped 06/01/17 1557)  gi cocktail (Maalox,Lidocaine,Donnatal) (30 mLs Oral Given 06/01/17 1527)     Initial Impression / Assessment and Plan / ED Course  I have reviewed the triage vital signs and the nursing notes.  Pertinent labs & imaging results that were available during my care of the patient were reviewed by me and considered in my medical decision making (see chart for details).     32 y.o. female here with abd pain/n/v/d this morning, and high sugars. She is well known in the ED, comes frequently for elevated sugars, she reports compliance but it's questionable whether she actually is compliant. On exam, mild upper abd TTP, nonperitoneal, neg murphy's sign, no lower abd TTP. She reports vaginal itching but no discharge/bleeding, doubt need for pelvic exam, will likely empirically treat for yeast infection. Will get labs, give fluids, zofran, pepcid, GI cocktail, and insulin. Doubt need for imaging. Will reassess shortly.   7:04 PM  CBC w/diff with chronic stable anemia and stable thrombocytopenia. CMP with marginally low NA 132 likely from hyperglycemia, bicarb just barely low at 21 but no anion gap, gluc 501, alk phos marginally elevated at 130 similar to prior  visits. Lipase WNL. U/A grossly contaminated with 6-10 squamous, >500 glucose but no ketones, neg nitrites, moderate leuks, 21-50 RBCs and 11-20 WBCs but rare bacteria; pt without s/sx of UTI but given this questionable sample and her c/o abd pain and elevated sugars, will send for UCx and empirically treat. BetaHCG neg. Mg level marginally low at 1.5, doubt need for repletion at this time. VBG WNL. Repeat CBG 313 after fluids/meds (although not completely done with 2L bolus).   Overall, symptoms likely related to her chronically elevated sugars likely due to poor compliance and poor eating habits; could be also from viral gastroenteritis. Pt tolerating PO well here and feeling better. Plan was to give diflucan since she's complained of vaginal itching, and will give second dose to take after finishing abx for UTI. Will empirically treat for UTI.  Advised BRAT diet for diarrhea, as well as other OTC remedies for symptomatic relief. Will rx zofran as well. Discussed importance of compliance with diabetes meds and diet/exercise regimen. Advised F/up with PCP in 3-5 days for recheck of symptoms and ongoing management of her diabetes. I explained the diagnosis and have given explicit precautions to return to the ER including for any other new or worsening symptoms. The patient understands and accepts the medical plan as it's been dictated and I have answered their questions. I had given her overview  of d/c instructions and had printed her rx's, but was awaiting them to give her diflucan and unfortunately pt appears to have eloped without the d/c instructions, diflucan here, or the rx's going home.  Nursing staff is attempting to get in touch with pt to get her to get the rx's she was supposed to be given here. Pt apparently eloped in stable condition    Final Clinical Impressions(s) / ED Diagnoses   Final diagnoses:  Type 2 diabetes mellitus with hyperglycemia, with long-term current use of insulin (HCC)  Upper  abdominal pain  Nausea and vomiting in adult patient  Diarrhea, unspecified type  Vaginal itching  Acute lower UTI  Chronic anemia  Thrombocytopenia (H[facility])  Viral gastroenteritis    ED Discharge Orders        Ordered    fluconazole (DIFLUCAN) 150 MG tablet   Once     06/01/17 1734    cephALEXin (KEFLEX) 500 MG capsule     06/01/17 1734    ondansetron (ZOFRAN ODT) 4 MG disintegrating tablet  Every 8 hours PRN     06/01/17 [id number] Aikam Hellickson, [place], PA-C 06/01/17 [place], [place], DO 06/08/17 [phone]

## 2017-06-01 NOTE — ED Notes (Signed)
Pt stated that she needed to leave at 1800 to the NT. NT stated to wait for discharge, meds and IV removal. She left before this RN was able to get to the room. Could not find the IV cath in room. Called mother and she located the pt that lives with her and stated that she took out the IV cath and put it in the sharps container in the room. Looked in container to confirm. Pt was ambulatory and CAOx4 on last rounding. Did not witness pt leaving.

## 2017-06-01 NOTE — ED Triage Notes (Addendum)
Per EMS, pt is coming from home complaining of abdominal pain, nausea, and hyperglycemia. Pt did not vomit with EMS. Pt reports taking 22 units of novolog at 12. Pt states she did not know her glucose at the time of that administration. Pt is AO x4 and ambulatory. Pt noncompliant with PCP visits. Pt states she does not go to visits d/t lack of transportation. Pt has a hx of bipolar, schizophrenia, diabetes, hypertension, hyperlipidemia, and seizures.

## 2017-06-01 NOTE — Discharge Instructions (Signed)
Your work up today has been reassuring, you are not in DKA. You MUST TAKE YOUR HOME MEDICATIONS EXACTLY HOW [place], and you need to eat a low-carbohydrate diet to help control your diabetes.  You were treated for a vaginal yeast infection, take the second dose of diflucan after finishing your antibiotics.  You appear to possibly have a urinary tract infection; take antibiotic as directed until completed, and stay well hydrated.  Your other symptoms could be from a viral gastroenteritis. Use zofran as prescribed, as needed for nausea. Alternate between tylenol and motrin as needed for pain. May consider using over the counter tums, maalox, pepto bismol, or other over the counter remedies to help with symptoms. Stay well hydrated with small sips of fluids throughout the day. Follow a BRAT (banana-rice-applesauce-toast) diet as described below for the next 24-48 hours. The 'BRAT' diet is suggested, then progress to diet as tolerated as symptoms abate. Call your regular doctor if bloody stools, persistent diarrhea, vomiting, fever or abdominal pain.  Follow up with your regular doctor in 3-5 days for recheck of symptoms and ongoing management of your diabetes. Return to ER for changing or worsening of symptoms.

## 2017-06-01 NOTE — ED Notes (Signed)
Bed: WA21 Expected date:  Expected time:  Means of arrival:  Comments: 32 yo hyperglycemia

## 2017-06-02 LAB — URINE CULTURE

## 2017-06-03 ENCOUNTER — Emergency Department (HOSPITAL_COMMUNITY)
Admission: EM | Admit: 2017-06-03 | Discharge: 2017-06-05 | Disposition: A | Discharge status: Home / Self Care | Payer: Medicare HMO | Attending: Emergency Medicine | Admitting: Emergency Medicine

## 2017-06-03 DIAGNOSIS — F250 Schizoaffective disorder, bipolar type: Secondary | ICD-10-CM | POA: Diagnosis not present

## 2017-06-03 DIAGNOSIS — F329 Major depressive disorder, single episode, unspecified: Secondary | ICD-10-CM | POA: Diagnosis not present

## 2017-06-03 DIAGNOSIS — R4587 Impulsiveness: Secondary | ICD-10-CM | POA: Diagnosis not present

## 2017-06-03 DIAGNOSIS — I10 Essential (primary) hypertension: Secondary | ICD-10-CM | POA: Insufficient documentation

## 2017-06-03 DIAGNOSIS — Z79899 Other long term (current) drug therapy: Secondary | ICD-10-CM | POA: Insufficient documentation

## 2017-06-03 DIAGNOSIS — Z794 Long term (current) use of insulin: Secondary | ICD-10-CM | POA: Diagnosis not present

## 2017-06-03 DIAGNOSIS — E1165 Type 2 diabetes mellitus with hyperglycemia: Secondary | ICD-10-CM | POA: Diagnosis not present

## 2017-06-03 DIAGNOSIS — R739 Hyperglycemia, unspecified: Secondary | ICD-10-CM

## 2017-06-03 DIAGNOSIS — F32A Depression, unspecified: Secondary | ICD-10-CM

## 2017-06-03 DIAGNOSIS — R45851 Suicidal ideations: Secondary | ICD-10-CM | POA: Insufficient documentation

## 2017-06-03 LAB — URINALYSIS, ROUTINE W REFLEX MICROSCOPIC
Bacteria, UA: NONE SEEN
Bilirubin Urine: NEGATIVE
Glucose, UA: >=500 mg/dL — AB
Hgb urine dipstick: NEGATIVE
Ketones, ur: NEGATIVE mg/dL
Nitrite: NEGATIVE
PROTEIN: NEGATIVE mg/dL
SPECIFIC GRAVITY, URINE: 1.035 — AB (ref 1.005–1.030)
pH: 6 (ref 5.0–8.0)

## 2017-06-03 LAB — CBG MONITORING, ED: GLUCOSE-CAPILLARY: 435 mg/dL — AB (ref 65–99)

## 2017-06-03 LAB — PREGNANCY, URINE: Preg Test, Ur: NEGATIVE

## 2017-06-03 MED ORDER — SODIUM CHLORIDE 0.9 % IV BOLUS
500.0000 mL | Freq: Once | INTRAVENOUS | Status: AC
  Administered 2017-06-04: 500 mL via INTRAVENOUS

## 2017-06-03 MED ORDER — SODIUM CHLORIDE 0.9 % IV BOLUS
1000.0000 mL | Freq: Once | INTRAVENOUS | Status: AC
  Administered 2017-06-04: 1000 mL via INTRAVENOUS

## 2017-06-03 NOTE — ED Notes (Signed)
Bed: WTR7 Expected date:  Expected time:  Means of arrival:  Comments: 

## 2017-06-03 NOTE — ED Notes (Signed)
Bed: WTR6 Expected date:  Expected time:  Means of arrival:  Comments: 

## 2017-06-03 NOTE — ED Provider Notes (Signed)
[facility] DEPT Provider Note   CSN: [id number] Arrival date & time: 06/03/17  2141  Time seen 23:35 PM    History   Chief Complaint Chief Complaint  Patient presents with  . Suicidal    HPI Tina FRANQUI is a 32 y.o. female.  HPI patient has a history of schizoaffective disorder bipolar type states she started feeling like killing herself this morning.  She denies any precipitating events.  She states she does not have children and today is Mother's Day.  She states she lives of her mother and denies any problems with her mother today.  She says she would cut herself and take all of her medication to harm her self.  She states she tried to do that a year ago and was admitted to behavioral health.  She states she felt better after she was admitted.  She states she quit seeing her psychiatrist a few months ago due to transportation problems.  She does not have a therapist.  She states she sees "[name]" who no one else can see and she is been seeing them for years.  She states he tells her to go ahead and kill herself.  She is unsure if she is taking any depression medication.  She states that she has diabetes but she "forgets" to take her insulin.  Patient was just seen in the ED on May 10 for hyperglycemia.  PCP Pavelock, Ralene Bathe, MD   Past Medical History:  Diagnosis Date  . Anxiety   . Bipolar 1 disorder ([place])   . Cancer of abdominal wall   . Depression   . Diabetes mellitus without complication ([place])   . Hypertension   . Obesity   . Obesity   . Polycystic ovarian syndrome 07/01/2011   Patient report  . 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 Schizophrenia [facility])     Patient Active Problem List   Diagnosis Date Noted  . DKA (diabetic ketoacidoses) ([place]) 03/14/2017  . Chronic constipation   . Seizures ([place]) 12/11/2016  . Acute lower UTI 12/11/2016  . DM2 (diabetes mellitus, type 2) ([place]) 12/11/2016  . Uncontrolled diabetes mellitus ([place]) 11/03/2016  .  Schizoaffective disorder, bipolar type ([place]) 11/02/2016  . Cluster B personality disorder ([place]) 11/02/2016  . Schizoaffective disorder, mixed type ([place]) 05/30/2014  . Suicidal ideation   . 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 Headache 04/15/2013  . HTN (hypertension) 04/15/2013  . Post traumatic stress disorder 12/07/2011  . CAP (community acquired pneumonia) 08/27/2011  . Chest pain 08/26/2011  . SOB (shortness of breath) 08/26/2011  . Fever 08/26/2011  . Hypokalemia 08/26/2011  . PSVT (paroxysmal supraventricular tachycardia) ([place]) 08/26/2011  . ADHD 09/23/2007  . EPIGASTRIC PAIN 09/23/2007  . Obesity, unspecified 07/30/2007  . Depression 07/30/2007  . SLEEP DISORDER 07/30/2007  . IMPAIRED FASTING GLUCOSE 07/30/2007  . FATIGUE 11/21/2006  . ABNORMAL FINDINGS, ELEVATED BP W/O HTN 11/21/2006  . METRORRHAGIA 06/13/2006  . DISORDER, MENSTRUAL NEC 06/13/2006  . DIZZINESS 06/13/2006  . POLYCYSTIC OVARIAN DISEASE 04/25/2006  . AMENORRHEA, SECONDARY 04/20/2006  . ACNE, MILD 04/20/2006  . Abdominal pain 04/20/2006    Past Surgical History:  Procedure Laterality Date  . CHOLECYSTECTOMY    . COLONOSCOPY WITH PROPOFOL N/A 03/08/2017   Procedure: COLONOSCOPY WITH PROPOFOL;  Surgeon: Milus Banister, MD;  Location: [facility] ENDOSCOPY;  Service: Endoscopy;  Laterality: N/A;  . HERNIA REPAIR    . Ovarian Cyst Excision    . VARICOSE VEIN SURGERY  OB History    Gravida  0   Para      Term      Preterm      AB      Living        SAB      TAB      Ectopic      Multiple      Live Births               Home Medications    Prior to Admission medications   Medication Sig Start Date End Date Taking? Authorizing Provider  busPIRone (BUSPAR) 30 MG tablet Take 15 mg by mouth 2 (two) times daily.    Yes [provider]  chlorproMAZINE (THORAZINE) 50 MG tablet Take 1 tablet (50 mg total) by mouth 3 (three) times daily. For agitation/mood control 02/09/17  Yes  Lindell Spar I, NP  dicyclomine (BENTYL) 20 MG tablet Take 1 tablet (20 mg total) by mouth 3 (three) times daily with meals as needed for spasms (abdominal pain and/or diarrhea). 05/26/17  Yes Street, [place], PA-C  gabapentin (NEURONTIN) 400 MG capsule Take 1 capsule (400 mg total) by mouth 3 (three) times daily. For agitation/diabetic neuropathy 02/09/17  Yes Lindell Spar I, NP  insulin aspart (NOVOLOG) 100 UNIT/ML injection Inject 6 Units into the skin 3 (three) times daily with meals. For diabetes management Patient taking differently: Inject 22 Units into the skin 3 (three) times daily with meals. For diabetes management 02/09/17  Yes Lindell Spar I, NP  insulin detemir (LEVEMIR) 100 UNIT/ML injection Inject 0.68 mLs (68 Units total) into the skin 2 (two) times daily. For diabetes management 03/16/17  Yes Florencia Reasons, MD  levETIRAcetam (KEPPRA) 500 MG tablet Take 1 tablet (500 mg total) by mouth 2 (two) times daily. For mood stabilization 02/09/17  Yes Nwoko, Herbert Pun I, NP  lisinopril (PRINIVIL,ZESTRIL) 10 MG tablet Take 1 tablet (10 mg total) by mouth daily. For high blood pressure 02/09/17  Yes Nwoko, Herbert Pun I, NP  ondansetron (ZOFRAN ODT) 4 MG disintegrating tablet Take 1 tablet (4 mg total) by mouth every 8 (eight) hours as needed for nausea or vomiting. 06/01/17  Yes Street, [place], PA-C  QUEtiapine (SEROQUEL XR) 200 MG 24 hr tablet Take 400 mg by mouth at bedtime.    Yes [provider]  QUEtiapine (SEROQUEL) 100 MG tablet Take 5 tablets (500 mg total) by mouth at bedtime. For mood control Patient taking differently: Take 100 mg by mouth daily.  02/09/17  Yes Lindell Spar I, NP  sertraline (ZOLOFT) 100 MG tablet Take 200 mg by mouth daily.   Yes [provider]  simvastatin (ZOCOR) 10 MG tablet Take 10 mg by mouth at bedtime.   Yes [provider]  topiramate (TOPAMAX) 100 MG tablet Take 100 mg by mouth 2 (two) times daily.   Yes [provider]  traZODone (DESYREL)  50 MG tablet Take 50 mg by mouth at bedtime.   Yes [provider]  benzonatate (TESSALON) 100 MG capsule Take 1 capsule (100 mg total) by mouth 3 (three) times daily as needed for cough. Patient not taking: Reported on 04/26/2017 03/30/17   Orpah Greek, MD  cephALEXin [facility]) 500 MG capsule 2 caps po bid x 7 days 06/01/17   Street, [place], PA-C  cloNIDine (CATAPRES) 0.1 MG tablet Take 1 tablet (0.1 mg total) by mouth 2 (two) times daily. For high blood pressure Patient not taking: Reported on 05/26/2017 02/09/17   Nwoko,  Loleta Dicker, NP  cyclobenzaprine (FLEXERIL) 5 MG tablet Take 1 tablet (5 mg total) by mouth 2 (two) times daily as needed for muscle spasms. Patient not taking: Reported on 04/26/2017 03/16/17   Florencia Reasons, MD  dicyclomine (BENTYL) 20 MG tablet Take 1 tablet (20 mg total) by mouth every 8 (eight) hours as needed for spasms. Patient not taking: Reported on 04/11/2017 02/09/17   Lindell Spar I, NP  Dulaglutide (TRULICITY) 1.5 PY/1.9JK SOPN Inject 1.5 mg into the skin every Friday. For diabetes management Patient not taking: Reported on 05/26/2017 02/09/17   Lindell Spar I, NP  fenofibrate 160 MG tablet Take 1 tablet (160 mg total) by mouth daily. For high Cholesterol Patient not taking: Reported on 04/11/2017 02/10/17   Lindell Spar I, NP  glucose blood test strip Use as instructed Patient not taking: Reported on 05/26/2017 04/26/17   Montine Circle, PA-C  hydrOXYzine (ATARAX/VISTARIL) 50 MG tablet Take 1 tablet (50 mg total) by mouth 3 (three) times daily as needed for anxiety. Patient not taking: Reported on 04/26/2017 02/09/17   Lindell Spar I, NP  magnesium oxide (MAG-OX) 400 MG tablet Take 1 tablet (400 mg total) by mouth daily. Patient not taking: Reported on 04/11/2017 03/16/17   Florencia Reasons, MD  ondansetron (ZOFRAN ODT) 4 MG disintegrating tablet Take 1 tablet (4 mg total) by mouth every 8 (eight) hours as needed for nausea or vomiting. Patient not taking: Reported on 06/04/2017  05/26/17   Street, [place], PA-C  pantoprazole (PROTONIX) 40 MG tablet Take 1 tablet (40 mg total) by mouth daily. For acid reflux Patient not taking: Reported on 04/26/2017 02/09/17   Lindell Spar I, NP  QUEtiapine (SEROQUEL) 50 MG tablet Take 1 tablet (50 mg total) by mouth 2 (two) times daily. For agitation/mood control Patient not taking: Reported on 04/26/2017 02/09/17   Lindell Spar I, NP  sodium bicarbonate 650 MG tablet Take 1 tablet (650 mg total) by mouth daily. Patient not taking: Reported on 04/11/2017 03/16/17   Florencia Reasons, MD    Family History Family History  Problem Relation Age of Onset  . Coronary artery disease Maternal Grandmother   . Diabetes type II Maternal Grandmother   . Cancer Maternal Grandmother   . Hypertension Mother   . Hypertension Father     Social History Social History   Tobacco Use  . Smoking status: Never Smoker  . Smokeless tobacco: Never Used  Substance Use Topics  . Alcohol use: No  . Drug use: No  Lives with mother (states mother never leaves the house) On disability for learning disorder and schizoaffective disorder with bipolar    Allergies   Fish-derived products; Geodon [ziprasidone hcl]; Haldol [haloperidol lactate]; Buprenorphine hcl; Compazine [prochlorperazine]; Morphine and related; and Toradol [ketorolac tromethamine]   Review of Systems Review of Systems  All other systems reviewed and are negative.    Physical Exam Updated Vital Signs BP 129/78 (BP Location: Right Arm)   Pulse 90   Temp 98.4 F (36.9 C) (Oral)   Resp 16   Ht 5\' 4"  (1.626 m)   Wt 100.2 kg (220 lb 12.8 oz)   LMP 05/30/2017   SpO2 95%   BMI 37.90 kg/m   Physical Exam  Constitutional: She is oriented to person, place, and time. She appears well-developed and well-nourished.  Non-toxic appearance. She does not appear ill. No distress.  HENT:  Head: Normocephalic and atraumatic.  Right Ear: External ear normal.  Left Ear: External ear normal.  Nose: Nose  normal. No mucosal edema or rhinorrhea.  Mouth/Throat: Oropharynx is clear and moist and mucous membranes are normal. No dental abscesses or uvula swelling.  Eyes: Pupils are equal, round, and reactive to light. Conjunctivae and EOM are normal.  Neck: Normal range of motion and full passive range of motion without pain. Neck supple.  Cardiovascular: Normal rate, regular rhythm and normal heart sounds. Exam reveals no gallop and no friction rub.  No murmur heard. Pulmonary/Chest: Effort normal and breath sounds normal. No respiratory distress. She has no wheezes. She has no rhonchi. She has no rales. She exhibits no tenderness and no crepitus.  Abdominal: Soft. Normal appearance and bowel sounds are normal. She exhibits no distension. There is no tenderness. There is no rebound and no guarding.  Musculoskeletal: Normal range of motion. She exhibits no edema or tenderness.  Moves all extremities well.   Neurological: She is alert and oriented to person, place, and time. She has normal strength. No cranial nerve deficit.  Skin: Skin is warm, dry and intact. No rash noted. No erythema. No pallor.  Psychiatric: Her speech is delayed. She is slowed. She expresses suicidal ideation. She expresses no homicidal ideation. She expresses suicidal plans. She expresses no homicidal plans.  Flat affect, does not appear to be responding to external stimulae  Nursing note and vitals reviewed.    ED Treatments / Results  Labs (all labs ordered are listed, but only abnormal results are displayed) Results for orders placed or performed during the hospital encounter of 06/03/17  Comprehensive metabolic panel  Result Value Ref Range   Sodium 132 (L) 135 - 145 mmol/L   Potassium 4.0 3.5 - 5.1 mmol/L   Chloride 99 (L) 101 - 111 mmol/L   CO2 19 (L) 22 - 32 mmol/L   Glucose, Bld 400 (H) 65 - 99 mg/dL   BUN 6 6 - 20 mg/dL   Creatinine, Ser 0.43 (L) 0.44 - 1.00 mg/dL   Calcium 9.0 8.9 - 10.3 mg/dL   Total  Protein 7.5 6.5 - 8.1 g/dL   Albumin 3.7 3.5 - 5.0 g/dL   AST 42 (H) 15 - 41 U/L   ALT 31 14 - 54 U/L   Alkaline Phosphatase 108 38 - 126 U/L   Total Bilirubin 0.3 0.3 - 1.2 mg/dL   GFR calc non Af Amer >60 >60 mL/min   GFR calc Af Amer >60 >60 mL/min   Anion gap 14 5 - 15  Ethanol  Result Value Ref Range   Alcohol, Ethyl (B) <10 <10 mg/dL  CBC with Differential  Result Value Ref Range   WBC 4.8 4.0 - 10.5 K/uL   RBC 4.44 3.87 - 5.11 MIL/uL   Hemoglobin 11.3 (L) 12.0 - 15.0 g/dL   HCT 35.1 (L) 36.0 - 46.0 %   MCV 79.1 78.0 - 100.0 fL   MCH 25.5 (L) 26.0 - 34.0 pg   MCHC 32.2 30.0 - 36.0 g/dL   RDW 15.4 11.5 - 15.5 %   Platelets 116 (L) 150 - 400 K/uL   Neutrophils Relative % 70 %   Neutro Abs 3.4 1.7 - 7.7 K/uL   Lymphocytes Relative 22 %   Lymphs Abs 1.1 0.7 - 4.0 K/uL   Monocytes Relative 4 %   Monocytes Absolute 0.2 0.1 - 1.0 K/uL   Eosinophils Relative 4 %   Eosinophils Absolute 0.2 0.0 - 0.7 K/uL   Basophils Relative 0 %   Basophils Absolute 0.0 0.0 - 0.1 K/uL  Acetaminophen level  Result Value Ref Range   Acetaminophen (Tylenol), Serum <10 (L) 10 - 30 ug/mL  Salicylate level  Result Value Ref Range   Salicylate Lvl <9.0 2.8 - 30.0 mg/dL  Urinalysis, Routine w reflex microscopic  Result Value Ref Range   Color, Urine YELLOW YELLOW   APPearance HAZY (A) CLEAR   Specific Gravity, Urine 1.035 (H) 1.005 - 1.030   pH 6.0 5.0 - 8.0   Glucose, UA >=500 (A) NEGATIVE mg/dL   Hgb urine dipstick NEGATIVE NEGATIVE   Bilirubin Urine NEGATIVE NEGATIVE   Ketones, ur NEGATIVE NEGATIVE mg/dL   Protein, ur NEGATIVE NEGATIVE mg/dL   Nitrite NEGATIVE NEGATIVE   Leukocytes, UA TRACE (A) NEGATIVE   RBC / HPF 0-5 0 - 5 RBC/hpf   WBC, UA 0-5 0 - 5 WBC/hpf   Bacteria, UA NONE SEEN NONE SEEN   Squamous Epithelial / LPF 11-20 0 - 5  Urine rapid drug screen (hosp performed)  Result Value Ref Range   Opiates NONE DETECTED NONE DETECTED   Cocaine NONE DETECTED NONE DETECTED    Benzodiazepines NONE DETECTED NONE DETECTED   Amphetamines NONE DETECTED NONE DETECTED   Tetrahydrocannabinol NONE DETECTED NONE DETECTED   Barbiturates NONE DETECTED NONE DETECTED  Pregnancy, urine  Result Value Ref Range   Preg Test, Ur NEGATIVE NEGATIVE  CBG monitoring, ED  Result Value Ref Range   Glucose-Capillary 435 (H) 65 - 99 mg/dL  I-Stat CG4 Lactic Acid, ED  Result Value Ref Range   Lactic Acid, Venous 2.39 (HH) 0.5 - 1.9 mmol/L   Comment NOTIFIED PHYSICIAN   I-Stat CG4 Lactic Acid, ED  Result Value Ref Range   Lactic Acid, Venous 2.25 (HH) 0.5 - 1.9 mmol/L   Comment NOTIFIED PHYSICIAN   CBG monitoring, ED  Result Value Ref Range   Glucose-Capillary 241 (H) 65 - 99 mg/dL  I-Stat CG4 Lactic Acid, ED  Result Value Ref Range   Lactic Acid, Venous 1.73 0.5 - 1.9 mmol/L   Laboratory interpretation all normal except hyperglycemia without elevated anion gap, mildly elevated lactic acid which improved with IV fluids    EKG None  Radiology No results found.  Procedures Procedures (including critical care time)  Medications Ordered in ED Medications  busPIRone (BUSPAR) tablet 15 mg (has no administration in time range)  chlorproMAZINE (THORAZINE) tablet 50 mg (has no administration in time range)  dicyclomine (BENTYL) tablet 20 mg (has no administration in time range)  gabapentin (NEURONTIN) capsule 400 mg (has no administration in time range)  insulin aspart (novoLOG) injection 22 Units (has no administration in time range)  levETIRAcetam (KEPPRA) tablet 500 mg (has no administration in time range)  lisinopril (PRINIVIL,ZESTRIL) tablet 10 mg (has no administration in time range)  QUEtiapine (SEROQUEL XR) 24 hr tablet 400 mg (has no administration in time range)  QUEtiapine (SEROQUEL) tablet 100 mg (has no administration in time range)  sertraline (ZOLOFT) tablet 200 mg (has no administration in time range)  simvastatin (ZOCOR) tablet 10 mg (has no administration in  time range)  topiramate (TOPAMAX) tablet 100 mg (has no administration in time range)  traZODone (DESYREL) tablet 50 mg (has no administration in time range)  ondansetron (ZOFRAN-ODT) disintegrating tablet 4 mg (has no administration in time range)  sodium chloride 0.9 % bolus 1,000 mL (0 mLs Intravenous Stopped 06/04/17 0250)  sodium chloride 0.9 % bolus 500 mL (0 mLs Intravenous Stopped 06/04/17 0250)  sodium chloride 0.9 % bolus 1,000 mL (0 mLs Intravenous Stopped 06/04/17 0540)  insulin aspart (novoLOG) injection 5 Units (5 Units Intravenous Given 06/04/17 0250)     Initial Impression / Assessment and Plan / ED Course  I have reviewed the triage vital signs and the nursing notes.  Pertinent labs & imaging results that were available during my care of the patient were reviewed by me and considered in my medical decision making (see chart for details).    Pt was given IV fluids and IV insulin for hyperglycemia.  Her CBG did improve into the 200s.  At that point TTS consult was ordered and her home medications were ordered.   Final Clinical Impressions(s) / ED Diagnoses   Final diagnoses:  Suicidal ideation  Depression, unspecified depression type  Hyperglycemia   Disposition pending TTS consult  Rolland Porter, MD, Barbette Or, MD 06/04/17 [id number]

## 2017-06-04 ENCOUNTER — Other Ambulatory Visit: Payer: Self-pay

## 2017-06-04 ENCOUNTER — Encounter (HOSPITAL_COMMUNITY): Payer: Self-pay | Admitting: Emergency Medicine

## 2017-06-04 DIAGNOSIS — F329 Major depressive disorder, single episode, unspecified: Secondary | ICD-10-CM | POA: Diagnosis not present

## 2017-06-04 LAB — COMPREHENSIVE METABOLIC PANEL
ALT: 31 U/L (ref 14–54)
ANION GAP: 14 (ref 5–15)
AST: 42 U/L — ABNORMAL HIGH (ref 15–41)
Albumin: 3.7 g/dL (ref 3.5–5.0)
Alkaline Phosphatase: 108 U/L (ref 38–126)
BUN: 6 mg/dL (ref 6–20)
CHLORIDE: 99 mmol/L — AB (ref 101–111)
CO2: 19 mmol/L — AB (ref 22–32)
CREATININE: 0.43 mg/dL — AB (ref 0.44–1.00)
Calcium: 9 mg/dL (ref 8.9–10.3)
GFR calc non Af Amer: >60 mL/min (ref >60)
Glucose, Bld: 400 mg/dL — ABNORMAL HIGH (ref 65–99)
Potassium: 4 mmol/L (ref 3.5–5.1)
SODIUM: 132 mmol/L — AB (ref 135–145)
Total Bilirubin: 0.3 mg/dL (ref 0.3–1.2)
Total Protein: 7.5 g/dL (ref 6.5–8.1)

## 2017-06-04 LAB — RAPID URINE DRUG SCREEN, HOSP PERFORMED
AMPHETAMINES: NOT DETECTED
BENZODIAZEPINES: NOT DETECTED
Barbiturates: NOT DETECTED
Cocaine: NOT DETECTED
OPIATES: NOT DETECTED
TETRAHYDROCANNABINOL: NOT DETECTED

## 2017-06-04 LAB — CBG MONITORING, ED
GLUCOSE-CAPILLARY: 378 mg/dL — AB (ref 65–99)
GLUCOSE-CAPILLARY: 250 mg/dL — AB (ref 65–99)
Glucose-Capillary: 241 mg/dL — ABNORMAL HIGH (ref 65–99)

## 2017-06-04 LAB — CBC WITH DIFFERENTIAL/PLATELET
Basophils Absolute: 0 10*3/uL (ref 0.0–0.1)
Basophils Relative: 0 %
Eosinophils Absolute: 0.2 10*3/uL (ref 0.0–0.7)
Eosinophils Relative: 4 %
HEMATOCRIT: 35.1 % — AB (ref 36.0–46.0)
HEMOGLOBIN: 11.3 g/dL — AB (ref 12.0–15.0)
LYMPHS ABS: 1.1 10*3/uL (ref 0.7–4.0)
LYMPHS PCT: 22 %
MCH: 25.5 pg — AB (ref 26.0–34.0)
MCHC: 32.2 g/dL (ref 30.0–36.0)
MCV: 79.1 fL (ref 78.0–100.0)
Monocytes Absolute: 0.2 10*3/uL (ref 0.1–1.0)
Monocytes Relative: 4 %
NEUTROS PCT: 70 %
Neutro Abs: 3.4 10*3/uL (ref 1.7–7.7)
Platelets: 116 10*3/uL — ABNORMAL LOW (ref 150–400)
RBC: 4.44 MIL/uL (ref 3.87–5.11)
RDW: 15.4 % (ref 11.5–15.5)
WBC: 4.8 10*3/uL (ref 4.0–10.5)

## 2017-06-04 LAB — I-STAT CG4 LACTIC ACID, ED
LACTIC ACID, VENOUS: 1.73 mmol/L (ref 0.5–1.9)
Lactic Acid, Venous: 2.39 mmol/L (ref 0.5–1.9)
Lactic Acid, Venous: 2.25 mmol/L (ref 0.5–1.9)
Lactic Acid, Venous: 1.68 mmol/L (ref 0.5–1.9)

## 2017-06-04 LAB — ACETAMINOPHEN LEVEL: Acetaminophen (Tylenol), Serum: <10 ug/mL — ABNORMAL LOW (ref 10–30)

## 2017-06-04 LAB — ETHANOL: Alcohol, Ethyl (B): <10 mg/dL (ref <10)

## 2017-06-04 LAB — SALICYLATE LEVEL

## 2017-06-04 MED ORDER — DICYCLOMINE HCL 20 MG PO TABS
20.0000 mg | ORAL_TABLET | Freq: Three times a day (TID) | ORAL | Status: DC | PRN
Start: 2017-06-04 — End: 2017-06-05

## 2017-06-04 MED ORDER — BUSPIRONE HCL 10 MG PO TABS
15.0000 mg | ORAL_TABLET | Freq: Two times a day (BID) | ORAL | Status: DC
  Administered 2017-06-04 – 2017-06-05 (×3): 15 mg via ORAL
  Filled 2017-06-04 (×3): qty 2

## 2017-06-04 MED ORDER — SERTRALINE HCL 50 MG PO TABS
200.0000 mg | ORAL_TABLET | Freq: Every day | ORAL | Status: DC
  Administered 2017-06-04 – 2017-06-05 (×2): 200 mg via ORAL
  Filled 2017-06-04 (×2): qty 4

## 2017-06-04 MED ORDER — QUETIAPINE FUMARATE ER 200 MG PO TB24
400.0000 mg | ORAL_TABLET | Freq: Every day | ORAL | Status: DC
  Administered 2017-06-04: 400 mg via ORAL
  Filled 2017-06-04 (×2): qty 2

## 2017-06-04 MED ORDER — SODIUM CHLORIDE 0.9 % IV BOLUS
1000.0000 mL | Freq: Once | INTRAVENOUS | Status: AC
  Administered 2017-06-04: 1000 mL via INTRAVENOUS

## 2017-06-04 MED ORDER — SIMVASTATIN 10 MG PO TABS
10.0000 mg | ORAL_TABLET | Freq: Every day | ORAL | Status: DC
  Administered 2017-06-04: 10 mg via ORAL
  Filled 2017-06-04 (×2): qty 1

## 2017-06-04 MED ORDER — CHLORPROMAZINE HCL 25 MG PO TABS
50.0000 mg | ORAL_TABLET | Freq: Three times a day (TID) | ORAL | Status: DC
  Administered 2017-06-04 – 2017-06-05 (×3): 50 mg via ORAL
  Filled 2017-06-04 (×3): qty 2

## 2017-06-04 MED ORDER — GABAPENTIN 400 MG PO CAPS
400.0000 mg | ORAL_CAPSULE | Freq: Three times a day (TID) | ORAL | Status: DC
  Administered 2017-06-04 – 2017-06-05 (×3): 400 mg via ORAL
  Filled 2017-06-04 (×3): qty 1

## 2017-06-04 MED ORDER — INSULIN ASPART 100 UNIT/ML ~~LOC~~ SOLN
22.0000 [IU] | Freq: Three times a day (TID) | SUBCUTANEOUS | Status: DC
  Administered 2017-06-04 – 2017-06-05 (×4): 22 [IU] via SUBCUTANEOUS
  Filled 2017-06-04 (×4): qty 1

## 2017-06-04 MED ORDER — TRAZODONE HCL 50 MG PO TABS
50.0000 mg | ORAL_TABLET | Freq: Every day | ORAL | Status: DC
  Administered 2017-06-04: 50 mg via ORAL
  Filled 2017-06-04: qty 1

## 2017-06-04 MED ORDER — INSULIN ASPART 100 UNIT/ML ~~LOC~~ SOLN
5.0000 [IU] | Freq: Once | SUBCUTANEOUS | Status: AC
  Administered 2017-06-04: 5 [IU] via INTRAVENOUS
  Filled 2017-06-04: qty 1

## 2017-06-04 MED ORDER — QUETIAPINE FUMARATE 100 MG PO TABS
100.0000 mg | ORAL_TABLET | Freq: Every day | ORAL | Status: DC
  Administered 2017-06-04 – 2017-06-05 (×2): 100 mg via ORAL
  Filled 2017-06-04 (×2): qty 1

## 2017-06-04 MED ORDER — LISINOPRIL 10 MG PO TABS
10.0000 mg | ORAL_TABLET | Freq: Every day | ORAL | Status: DC
  Administered 2017-06-04 – 2017-06-05 (×2): 10 mg via ORAL
  Filled 2017-06-04 (×2): qty 1

## 2017-06-04 MED ORDER — TOPIRAMATE 100 MG PO TABS
100.0000 mg | ORAL_TABLET | Freq: Two times a day (BID) | ORAL | Status: DC
  Administered 2017-06-04 – 2017-06-05 (×3): 100 mg via ORAL
  Filled 2017-06-04 (×3): qty 1

## 2017-06-04 MED ORDER — ONDANSETRON 4 MG PO TBDP: 4.0000 mg | ORAL_TABLET | Freq: Three times a day (TID) | ORAL | Status: DC | PRN

## 2017-06-04 MED ORDER — LEVETIRACETAM 500 MG PO TABS
500.0000 mg | ORAL_TABLET | Freq: Two times a day (BID) | ORAL | Status: DC
  Administered 2017-06-04 – 2017-06-05 (×3): 500 mg via ORAL
  Filled 2017-06-04 (×3): qty 1

## 2017-06-04 NOTE — ED Notes (Signed)
Patient currently asleep with no signs of distress noted at this time. Respirations regular and unlabored; skin warm and dry. Sitter at bedside for safety.

## 2017-06-04 NOTE — ED Triage Notes (Signed)
Pt reports having suicidal ideation that began this morning. With a plan of cutting herself.

## 2017-06-04 NOTE — BH Assessment (Signed)
[facility] Assessment Progress Note  Case was staffed with Romilda Garret FNP who recommended patient be monitored and observed for safety. Patient will also be evaluated for medication management.

## 2017-06-04 NOTE — ED Notes (Signed)
Patient awake and is cooperative and pleasant at this time. Pt given snacks upon request. Pt currently denies suicide or plans to harm herself. Pt states "I want to go home now". Pt did not give a reason at this time, but staff able to reassure pt and she agrees to stay for morning evaluation. Pt verbally contracts for safety.

## 2017-06-04 NOTE — ED Notes (Signed)
The pt came up to me in the lobby and said, "I feel weird, and I feel like going into traffic and killing myself."

## 2017-06-04 NOTE — BH Assessment (Addendum)
Assessment Note  Tina Saunders is an 32 y.o. female that presents this date with thoughts of self harm. Patient denies any H/I or VH. Patient is time/place oriented and speaks in a low soft voice. Patient reports that she was receiving services from [facility] although discontinued that service due to relocating to [facility] to reside with her brother two weeks ago. Patient states she started seeing a provider there Tobie Lords MD) that has been assisting with medication management but reports that provider "put her on the wrong medications" reporting ongoing symptoms associated with depression to include: excessive fatigue and feeling worthless. Patient states her thoughts of self harm have worsened over the past few days due to depression but denies any plan or intent. Patient denies any SA use and does not seem to be responding to any internal stimuli. Patient reports 3 prior attempts at self harm and a history of cutting. Patient states she has not had any cutting episodes since last year and is vague in reference to those episodes. Patient denies any history of abuse or current legal issues. Patient reports that she could not stay with her brother in [place] and had to relocate back to reside with her mother in [place]. Patient will not elaborate on why she relocated but reported "it just didn't work out." Per notes patient has a history of schizoaffective disorder bipolar type stating she started feeling like killing herself this morning. She states she lives of her mother and denies any problems with her mother today. She says she would cut herself and take all of her medication to harm her self. She states she tried to do that a year ago and was admitted to behavioral health. She states she felt better after she was admitted. She states she quit seeing her psychiatrist a few months ago due to transportation problems. She does not have a therapist. Patient is requesting assistance to assist  with stabilization and medication management. Case was staffed with Romilda Garret FNP who recommended patient be monitored and observed for safety. Patient will also be evaluated for medication management.      Diagnosis: F25.0 Schizoaffective Disorder, bipolar type  Past Medical History:  Past Medical History:  Diagnosis Date  . Anxiety   . Bipolar 1 disorder ([place])   . Cancer of abdominal wall   . Depression   . Diabetes mellitus without complication ([place])   . Hypertension   . Obesity   . Obesity   . Polycystic ovarian syndrome 07/01/2011   Patient report  . 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 Schizophrenia [facility])     Past Surgical History:  Procedure Laterality Date  . CHOLECYSTECTOMY    . COLONOSCOPY WITH PROPOFOL N/A 03/08/2017   Procedure: COLONOSCOPY WITH PROPOFOL;  Surgeon: Milus Banister, MD;  Location: [facility] ENDOSCOPY;  Service: Endoscopy;  Laterality: N/A;  . HERNIA REPAIR    . Ovarian Cyst Excision    . VARICOSE VEIN SURGERY      Family History:  Family History  Problem Relation Age of Onset  . Coronary artery disease Maternal Grandmother   . Diabetes type II Maternal Grandmother   . Cancer Maternal Grandmother   . Hypertension Mother   . Hypertension Father     Social History:  reports that she has never smoked. She has never used smokeless tobacco. She reports that she does not drink alcohol or use drugs.  Additional Social History:  Alcohol / Drug Use Pain Medications: See MAR Prescriptions: See MAR Over the  Counter: See MAR History of alcohol / drug use?: No history of alcohol / drug abuse Longest period of sobriety (when/how long): Unknown Negative Consequences of Use: (Denies) Withdrawal Symptoms: (Denies)  CIWA: CIWA-Ar BP: 129/78 Pulse Rate: 90 COWS:    Allergies:  Allergies  Allergen Reactions  . Fish-Derived Products Anaphylaxis    Can only eat FLounder  . Geodon [Ziprasidone Hcl] Other (See Comments)    Face pulls, cant swallow - Locked Jaw  . Haldol  [Haloperidol Lactate] Other (See Comments)    Face pulls, can't swallow - Locked Jaw  . Buprenorphine Hcl Hives, Itching, Rash and Other (See Comments)    GI upset  . Compazine [Prochlorperazine] Other (See Comments)    anxiety and hyperactivity  . Morphine And Related Hives, Itching, Rash and Other (See Comments)    GI upset  . Toradol [Ketorolac Tromethamine] Other (See Comments)    Anxiety and hyperactivity    Home Medications:  (Not in a hospital admission)  OB/GYN Status:  Patient's last menstrual period was 05/30/2017.  General Assessment Data [facility] of [facility]: [facility] TTS Assessment: In system Is this a Tele or Face-to-Face Assessment?: Face-to-Face Is this an Initial Assessment or a Re-assessment for this encounter?: Initial Assessment Marital status: Single Maiden name: NA Is patient pregnant?: No Pregnancy Status: No Living Arrangements: Parent Can pt return to current living arrangement?: Yes Admission Status: Voluntary Is patient capable of signing voluntary admission?: Yes Referral Source: Self/Family/Friend Insurance type: Medicaid  Medical Screening Exam ([place]) Medical Exam completed: Yes  Crisis Care Plan Living Arrangements: Parent Legal Guardian: (NA) Name of Psychiatrist: Tobie Lords MD Name of Therapist: None  Education Status Is patient currently in school?: No Is the patient employed, unemployed or receiving disability?: Receiving disability income  Risk to self with the past 6 months Suicidal Ideation: Yes-Currently Present Has patient been a risk to self within the past 6 months prior to admission? : Yes Suicidal Intent: No Has patient had any suicidal intent within the past 6 months prior to admission? : Yes Is patient at risk for suicide?: Yes Suicidal Plan?: No Has patient had any suicidal plan within the past 6 months prior to admission? : Yes Access to Means: No What has been your use of drugs/alcohol within the last 12  months?: Denies Previous Attempts/Gestures: Yes How many times?: 3 Other Self Harm Risks: Hx of cutting Triggers for Past Attempts: Unknown Intentional Self Injurious Behavior: Cutting Comment - Self Injurious Behavior: Hx of cutting Family Suicide History: No Recent stressful life event(s): Other (Comment)(Recent relocated to another residence) Persecutory voices/beliefs?: No Depression: Yes Depression Symptoms: Feeling worthless/self pity Substance abuse history and/or treatment for substance abuse?: No Suicide prevention information given to non-admitted patients: Not applicable  Risk to Others within the past 6 months Homicidal Ideation: No Does patient have any lifetime risk of violence toward others beyond the six months prior to admission? : No Thoughts of Harm to Others: No Current Homicidal Intent: No Current Homicidal Plan: No Access to Homicidal Means: No Identified Victim: n History of harm to others?: No Assessment of Violence: None Noted Violent Behavior Description: n Does patient have access to weapons?: No Criminal Charges Pending?: No Does patient have a court date: No Is patient on probation?: No  Psychosis Hallucinations: Auditory Delusions: None noted  Mental Status Report Appearance/Hygiene: Unremarkable Eye Contact: Poor Motor Activity: Freedom of movement Speech: Unremarkable Level of Consciousness: Drowsy Mood: Depressed Affect: Flat Anxiety Level: Minimal Thought Processes: Coherent, Relevant  Judgement: Unimpaired Orientation: Person, Place, Time Obsessive Compulsive Thoughts/Behaviors: None  Cognitive Functioning Concentration: Normal Memory: Recent Intact, Remote Intact Is patient IDD: No Is patient DD?: No Insight: Fair Impulse Control: Fair Appetite: Poor Have you had any weight changes? : No Change Sleep: No Change Total Hours of Sleep: 7 Vegetative Symptoms: None  ADLScreening [facility] Assessment Services) Patient's cognitive  ability adequate to safely complete daily activities?: Yes Patient able to express need for assistance with ADLs?: Yes Independently performs ADLs?: Yes (appropriate for developmental age)  Prior Inpatient Therapy Prior Inpatient Therapy: Yes Prior Therapy Dates: 2019 Prior [facility]ilty[facility]Provider([facility] Reason for Treatment: MH issues  Prior Outpatient Therapy Prior Outpatient Therapy: Yes Prior Therapy Dates: 2019 Prior Therapy Facilty/Provider(s): [facility] Reason for Treatment: Med mang Does patient have an ACCT team?: Yes Does patient have Intensive In-House Services?  : No Does patient have [facility] services? : Yes Does patient have P4CC services?: No  ADL Screening (condition at time of admission) Patient's cognitive ability adequate to safely complete daily activities?: Yes Is the patient deaf or have difficulty hearing?: No Does the patient have difficulty seeing, even when wearing glasses/contacts?: No Does the patient have difficulty concentrating, remembering, or making decisions?: No Patient able to express need for assistance with ADLs?: Yes Does the patient have difficulty dressing or bathing?: No Independently performs ADLs?: Yes (appropriate for developmental age) Does the patient have difficulty walking or climbing stairs?: No Weakness of Legs: None Weakness of Arms/Hands: None  Home Assistive Devices/Equipment Home Assistive Devices/Equipment: None  Therapy Consults (therapy consults require a physician order) PT Evaluation Needed: No OT Evalulation Needed: No SLP Evaluation Needed: No Abuse/Neglect Assessment (Assessment to be complete while patient is alone) Physical Abuse: Denies Verbal Abuse: Denies Sexual Abuse: Denies Exploitation of patient/patient's resources: Denies Self-Neglect: Denies Values / Beliefs Cultural Requests During Hospitalization: None Spiritual Requests During Hospitalization: None Consults Spiritual Care Consult Needed:  No Social Work Consult Needed: No Regulatory affairs officer (For Healthcare) Does Patient Have a Medical Advance Directive?: No Would patient like information on creating a medical advance directive?: No - Patient declined    Additional Information 1:1 In Past 12 Months?: No CIRT Risk: No Elopement Risk: No Does patient have medical clearance?: Yes     Disposition: ase was staffed with Romilda Garret FNP who recommended patient be monitored and observed for safety. Patient will also be evaluated for medication management.    Disposition Initial Assessment Completed for this Encounter: Yes Disposition of Patient: (Observe and monitor for safety) Patient refused recommended treatment: No Mode of transportation if patient is discharged?: (Unknown)  On Site Evaluation by:   Reviewed with Physician:    Mamie Nick 06/04/2017 12:00 PM

## 2017-06-05 DIAGNOSIS — R739 Hyperglycemia, unspecified: Secondary | ICD-10-CM | POA: Insufficient documentation

## 2017-06-05 DIAGNOSIS — R45851 Suicidal ideations: Secondary | ICD-10-CM | POA: Diagnosis not present

## 2017-06-05 DIAGNOSIS — R4587 Impulsiveness: Secondary | ICD-10-CM

## 2017-06-05 DIAGNOSIS — F329 Major depressive disorder, single episode, unspecified: Secondary | ICD-10-CM | POA: Diagnosis not present

## 2017-06-05 DIAGNOSIS — F250 Schizoaffective disorder, bipolar type: Secondary | ICD-10-CM

## 2017-06-05 LAB — CBG MONITORING, ED
GLUCOSE-CAPILLARY: 238 mg/dL — AB (ref 65–99)
GLUCOSE-CAPILLARY: 256 mg/dL — AB (ref 65–99)
Glucose-Capillary: 291 mg/dL — ABNORMAL HIGH (ref 65–99)

## 2017-06-05 NOTE — BHH Suicide Risk Assessment (Cosign Needed)
Suicide Risk Assessment  Discharge Assessment   [facility] Discharge Suicide Risk Assessment   Principal Problem: Schizoaffective disorder, bipolar type [facility]) Discharge Diagnoses:  Patient Active Problem List   Diagnosis Date Noted  . Hyperglycemia [R73.9]   . DKA (diabetic ketoacidoses) ([place]) [E13.10] 03/14/2017  . Chronic constipation [K59.09]   . Seizures ([place]) [R56.9] 12/11/2016  . Acute lower UTI [N39.0] 12/11/2016  . DM2 (diabetes mellitus, type 2) ([place]) [E11.9] 12/11/2016  . Uncontrolled diabetes mellitus ([place]) [E11.65] 11/03/2016  . Schizoaffective disorder, bipolar type ([place]) [F25.0] 11/02/2016  . 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 Schizoaffective disorder, mixed type ([place]) [F25.0] 05/30/2014  . Suicidal ideation [R45.851]   . 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 Headache [R51] 04/15/2013  . HTN (hypertension) [I10] 04/15/2013  . Post traumatic stress disorder [F43.10] 12/07/2011  . CAP (community acquired pneumonia) [J18.9] 08/27/2011  . Chest pain [R07.9] 08/26/2011  . SOB (shortness of breath) [R06.02] 08/26/2011  . Fever [R50.9] 08/26/2011  . Hypokalemia [E87.6] 08/26/2011  . PSVT (paroxysmal supraventricular tachycardia) ([place]) [I47.1] 08/26/2011  . ADHD [F90.9] 09/23/2007  . EPIGASTRIC PAIN [R10.13] 09/23/2007  . Obesity, unspecified [E66.9] 07/30/2007  . Depression [F32.9] 07/30/2007  . SLEEP DISORDER [G47.9] 07/30/2007  . IMPAIRED FASTING GLUCOSE [R73.01] 07/30/2007  . FATIGUE [R53.81, R53.83] 11/21/2006  . ABNORMAL FINDINGS, ELEVATED BP W/O HTN [R03.0] 11/21/2006  . METRORRHAGIA [N92.1] 06/13/2006  . DISORDER, MENSTRUAL NEC [N94.9] 06/13/2006  . DIZZINESS [R42] 06/13/2006  . POLYCYSTIC OVARIAN DISEASE [E28.2] 04/25/2006  . AMENORRHEA, SECONDARY [N91.2] 04/20/2006  . ACNE, MILD [L70.8] 04/20/2006  . Abdominal pain [R10.9] 04/20/2006    Total Time spent with patient: 45 minutes  Musculoskeletal: Strength & Muscle Tone:  within normal limits Gait & Station: normal Patient leans: N/A  Psychiatric Specialty Exam: Physical Exam  Constitutional: She is oriented to person, place, and time. She appears well-developed and well-nourished.  HENT:  Head: Normocephalic.  Respiratory: Effort normal.  Musculoskeletal: Normal range of motion.  Neurological: She is alert and oriented to person, place, and time.  Psychiatric: Her speech is normal and behavior is normal. Thought content normal. Cognition and memory are normal. She expresses impulsivity. She exhibits a depressed mood.   Review of Systems  Psychiatric/Behavioral: Positive for depression. Negative for hallucinations, memory loss, substance abuse and suicidal ideas. The patient is not nervous/anxious and does not have insomnia.   All other systems reviewed and are negative.  Blood pressure 109/62, pulse (!) 102, temperature 98.3 F (36.8 C), temperature source Oral, resp. rate 20, height 5\' 4"  (1.626 m), weight 220 lb 12.8 oz (100.2 kg), last menstrual period 05/30/2017, SpO2 95 %.Body mass index is 37.9 kg/m. General Appearance: Casual Eye Contact:  Fair Speech:  Clear and Coherent and Normal Rate Volume:  Normal Mood:  Depressed Affect:  Congruent, Depressed and Flat Thought Process:  Coherent, Goal Directed and Linear Orientation:  Full (Time, Place, and Person) Thought Content:  Logical Suicidal Thoughts:  No Homicidal Thoughts:  No Memory:  Immediate;   Good Recent;   Good Remote;   Fair Judgement:  Fair Insight:  Fair Psychomotor Activity:  Decreased Concentration:  Concentration: Good and Attention Span: Good Recall:  Good Fund of Knowledge:  Good Language:  Good Akathisia:  No Handed:  Right AIMS (if indicated):    Assets:  Agricultural consultant Housing Social Support ADL's:  Intact Cognition:  WNL   Mental Status Per Nursing Assessment::   On Admission:   Suicidal ideation  Demographic Factors:   Caucasian and Low socioeconomic status  Loss Factors: Financial problems/change in socioeconomic status  Historical Factors: Impulsivity  Risk Reduction Factors:   Sense of responsibility to family  Continued Clinical Symptoms:  Depression:   Impulsivity  Cognitive Features That Contribute To Risk:  Closed-mindedness    Suicide Risk:  Minimal: No identifiable suicidal ideation.  Patients presenting with no risk factors but with morbid ruminations; may be classified as minimal risk based on the severity of the depressive symptoms    Plan Of Care/Follow-up recommendations:  Activity:  as tolerated Diet:  heart Healthy  Ethelene Hal, NP 06/05/2017, 12:32 PM

## 2017-06-05 NOTE — BH Assessment (Signed)
[facility] Assessment Progress Note  Per Buford Dresser, DO, this pt does not require psychiatric hospitalization at this time.  Pt is to be discharged from [facility] with recommendation to follow up with [facility]; she is to be advised to ask about resuming ACT Team services with them.  This has been included in pt's discharge instructions.  Pt's nurse, Tilda Franco, has been notified.  Jalene Mullet, [place] Triage Specialist [phone]

## 2017-06-05 NOTE — Consult Note (Addendum)
[facility] Consult   Reason for Consult:   Referring Physician:  EDP Patient Identification: Tina Saunders MRN:  [id number] Principal Diagnosis: Schizoaffective disorder, bipolar type [facility]) Diagnosis:   Patient Active Problem List   Diagnosis Date Noted  . Hyperglycemia [R73.9]   . DKA (diabetic ketoacidoses) ([place]) [E13.10] 03/14/2017  . Chronic constipation [K59.09]   . Seizures ([place]) [R56.9] 12/11/2016  . Acute lower UTI [N39.0] 12/11/2016  . DM2 (diabetes mellitus, type 2) ([place]) [E11.9] 12/11/2016  . Uncontrolled diabetes mellitus ([place]) [E11.65] 11/03/2016  . Schizoaffective disorder, bipolar type ([place]) [F25.0] 11/02/2016  . Cluster B personality disorder ([place]) [F60.89] 11/02/2016  . Schizoaffective disorder, mixed type ([place]) [F25.0] 05/30/2014  . Suicidal ideation [R45.851]   . 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 Headache [R51] 04/15/2013  . HTN (hypertension) [I10] 04/15/2013  . Post traumatic stress disorder [F43.10] 12/07/2011  . CAP (community acquired pneumonia) [J18.9] 08/27/2011  . Chest pain [R07.9] 08/26/2011  . SOB (shortness of breath) [R06.02] 08/26/2011  . Fever [R50.9] 08/26/2011  . Hypokalemia [E87.6] 08/26/2011  . PSVT (paroxysmal supraventricular tachycardia) ([place]) [I47.1] 08/26/2011  . ADHD [F90.9] 09/23/2007  . EPIGASTRIC PAIN [R10.13] 09/23/2007  . Obesity, unspecified [E66.9] 07/30/2007  . Depression [F32.9] 07/30/2007  . SLEEP DISORDER [G47.9] 07/30/2007  . IMPAIRED FASTING GLUCOSE [R73.01] 07/30/2007  . FATIGUE [R53.81, R53.83] 11/21/2006  . ABNORMAL FINDINGS, ELEVATED BP W/O HTN [R03.0] 11/21/2006  . METRORRHAGIA [N92.1] 06/13/2006  . DISORDER, MENSTRUAL NEC [N94.9] 06/13/2006  . DIZZINESS [R42] 06/13/2006  . POLYCYSTIC OVARIAN DISEASE [E28.2] 04/25/2006  . AMENORRHEA, SECONDARY [N91.2] 04/20/2006  . ACNE, MILD [L70.8] 04/20/2006  . Abdominal pain [R10.9] 04/20/2006    Total Time spent with patient: 45  minutes  Subjective:   Tina Saunders is a 32 y.o. female patient admitted with suicidal ideation.  HPI:  Pt was seen and chart reviewed with treatment team and Dr Mariea Clonts.  Pt denies suicidal/homicidal ideation, denies auditory/visual hallucinations and does not appear to be responding to internal stimuli. Pt stated she thinks her meds are not right and she started having suicidal thoughts. Pt stated she thinks her Dr went too low on her Seroquel. Pt lives with her mother and is able to contract for safety. Pt will make a follow up appointment to see her Dr upon discharge. Pt's UDS and BAL are negative. Pt is stable and psychiatrically clear for discharge.   Past Psychiatric History: As above  Risk to Self: None Risk to Others: None Prior Inpatient Therapy: Prior Inpatient Therapy: Yes Prior Therapy Dates: 2019 Prior Therapy Facilty/Provider(s): [facility] Reason for Treatment: MH issues Prior Outpatient Therapy: Prior Outpatient Therapy: Yes Prior Therapy Dates: 2019 Prior Therapy Facilty/Provider(s): [facility] Reason for Treatment: Med mang Does patient have an ACCT team?: Yes Does patient have Intensive In-House Services?  : No Does patient have [facility] services? : Yes Does patient have P4CC services?: No  Past Medical History:  Past Medical History:  Diagnosis Date  . Anxiety   . Bipolar 1 disorder ([place])   . Cancer of abdominal wall   . Depression   . Diabetes mellitus without complication ([place])   . Hypertension   . Obesity   . Obesity   . Polycystic ovarian syndrome 07/01/2011   Patient report  . 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 Schizophrenia [facility])     Past Surgical History:  Procedure Laterality Date  . CHOLECYSTECTOMY    . COLONOSCOPY WITH PROPOFOL N/A 03/08/2017   Procedure: COLONOSCOPY WITH  PROPOFOL;  Surgeon: Milus Banister, MD;  Location: [facility];  Service: Endoscopy;  Laterality: N/A;  . HERNIA REPAIR    . Ovarian Cyst Excision    . VARICOSE VEIN SURGERY     Family  History:  Family History  Problem Relation Age of Onset  . Coronary artery disease Maternal Grandmother   . Diabetes type II Maternal Grandmother   . Cancer Maternal Grandmother   . Hypertension Mother   . Hypertension Father    Family Psychiatric  History: Unknown Social History:  Social History   Substance and Sexual Activity  Alcohol Use No     Social History   Substance and Sexual Activity  Drug Use No    Social History   Socioeconomic History  . Marital status: Single    Spouse name: Not on file  . Number of children: 0  . Years of education: Not on file  . Highest education level: Not on file  Occupational History  . Not on file  Social Needs  . Financial resource strain: Not on file  . Food insecurity:    Worry: Not on file    Inability: Not on file  . Transportation needs:    Medical: Not on file    Non-medical: Not on file  Tobacco Use  . Smoking status: Never Smoker  . Smokeless tobacco: Never Used  Substance and Sexual Activity  . Alcohol use: No  . Drug use: No  . Sexual activity: Never    Birth control/protection: None  Lifestyle  . Physical activity:    Days per week: Not on file    Minutes per session: Not on file  . Stress: Not on file  Relationships  . Social connections:    Talks on phone: Not on file    Gets together: Not on file    Attends religious service: Not on file    Active member of club or organization: Not on file    Attends meetings of clubs or organizations: Not on file    Relationship status: Not on file  Other Topics Concern  . Not on file  Social History Narrative  . Not on file   Additional Social History: She lives at home with her mother.     Allergies:   Allergies  Allergen Reactions  . Fish-Derived Products Anaphylaxis    Can only eat [place]ounder  . Geodon [Ziprasidone Hcl] Other (See Comments)    Face pulls, cant swallow - Locked Jaw  . Haldol [Haloperidol Lactate] Other (See Comments)    Face pulls,  can't swallow - Locked Jaw  . Buprenorphine Hcl Hives, Itching, Rash and Other (See Comments)    GI upset  . Compazine [Prochlorperazine] Other (See Comments)    anxiety and hyperactivity  . Morphine And Related Hives, Itching, Rash and Other (See Comments)    GI upset  . Toradol [Ketorolac Tromethamine] Other (See Comments)    Anxiety and hyperactivity    Labs:  Results for orders placed or performed during the hospital encounter of 06/03/17 (from the past 48 hour(s))  CBG monitoring, ED     Status: Abnormal   Collection Time: 06/03/17 10:55 PM  Result Value Ref Range   Glucose-Capillary 435 (H) 65 - 99 mg/dL  Urinalysis, Routine w reflex microscopic     Status: Abnormal   Collection Time: 06/03/17 11:45 PM  Result Value Ref Range   Color, Urine YELLOW YELLOW   APPearance HAZY (A) CLEAR   Specific Gravity, Urine 1.035 (  H) 1.005 - 1.030   pH 6.0 5.0 - 8.0   Glucose, UA >=500 (A) NEGATIVE mg/dL   Hgb urine dipstick NEGATIVE NEGATIVE   Bilirubin Urine NEGATIVE NEGATIVE   Ketones, ur NEGATIVE NEGATIVE mg/dL   Protein, ur NEGATIVE NEGATIVE mg/dL   Nitrite NEGATIVE NEGATIVE   Leukocytes, UA TRACE (A) NEGATIVE   RBC / HPF 0-5 0 - 5 RBC/hpf   WBC, UA 0-5 0 - 5 WBC/hpf   Bacteria, UA NONE SEEN NONE SEEN   Squamous Epithelial / LPF 11-20 0 - 5    Comment: Performed at [facility], [place] [address] [place], [place] [address]  Urine rapid drug screen (hosp performed)     Status: None   Collection Time: 06/03/17 11:45 PM  Result Value Ref Range   Opiates NONE DETECTED NONE DETECTED   Cocaine NONE DETECTED NONE DETECTED   Benzodiazepines NONE DETECTED NONE DETECTED   Amphetamines NONE DETECTED NONE DETECTED   Tetrahydrocannabinol NONE DETECTED NONE DETECTED   Barbiturates NONE DETECTED NONE DETECTED    Comment: (NOTE) DRUG SCREEN FOR MEDICAL PURPOSES ONLY.  IF CONFIRMATION IS NEEDED FOR ANY PURPOSE, NOTIFY LAB WITHIN 5 DAYS. LOWEST DETECTABLE LIMITS FOR URINE  DRUG SCREEN Drug Class                     Cutoff (ng/mL) Amphetamine and metabolites    1000 Barbiturate and metabolites    200 Benzodiazepine                 456 Tricyclics and metabolites     300 Opiates and metabolites        300 Cocaine and metabolites        300 THC                            50 Performed at [facility], [place] [address], [place], [place] [address]   Pregnancy, urine     Status: None   Collection Time: 06/03/17 11:45 PM  Result Value Ref Range   Preg Test, Ur NEGATIVE NEGATIVE    Comment:        THE SENSITIVITY OF THIS METHODOLOGY IS >20 mIU/mL. Performed at [facility], [place] [address], [place], [place] [address]   Comprehensive metabolic panel     Status: Abnormal   Collection Time: 06/04/17 12:52 AM  Result Value Ref Range   Sodium 132 (L) 135 - 145 mmol/L   Potassium 4.0 3.5 - 5.1 mmol/L   Chloride 99 (L) 101 - 111 mmol/L   CO2 19 (L) 22 - 32 mmol/L   Glucose, Bld 400 (H) 65 - 99 mg/dL   BUN 6 6 - 20 mg/dL   Creatinine, Ser 0.43 (L) 0.44 - 1.00 mg/dL   Calcium 9.0 8.9 - 10.3 mg/dL   Total Protein 7.5 6.5 - 8.1 g/dL   Albumin 3.7 3.5 - 5.0 g/dL   AST 42 (H) 15 - 41 U/L   ALT 31 14 - 54 U/L   Alkaline Phosphatase 108 38 - 126 U/L   Total Bilirubin 0.3 0.3 - 1.2 mg/dL   GFR calc non Af Amer >60 >60 mL/min   GFR calc Af Amer >60 >60 mL/min    Comment: (NOTE) The eGFR has been calculated using the CKD EPI equation. This calculation has not been validated in all clinical situations. eGFR's persistently <60 mL/min signify possible Chronic Kidney Disease.  Anion gap 14 5 - 15    Comment: Performed at [facility], [place] [address], [place], [place] [address]  Ethanol     Status: None   Collection Time: 06/04/17 12:52 AM  Result Value Ref Range   Alcohol, Ethyl (B) <10 <10 mg/dL    Comment:        LOWEST DETECTABLE LIMIT FOR SERUM ALCOHOL IS 10 mg/dL FOR MEDICAL PURPOSES ONLY Performed at  [facility], [place] [address], [place], [place] [address]   CBC with Differential     Status: Abnormal   Collection Time: 06/04/17 12:52 AM  Result Value Ref Range   WBC 4.8 4.0 - 10.5 K/uL   RBC 4.44 3.87 - 5.11 MIL/uL   Hemoglobin 11.3 (L) 12.0 - 15.0 g/dL   HCT 35.1 (L) 36.0 - 46.0 %   MCV 79.1 78.0 - 100.0 fL   MCH 25.5 (L) 26.0 - 34.0 pg   MCHC 32.2 30.0 - 36.0 g/dL   RDW 15.4 11.5 - 15.5 %   Platelets 116 (L) 150 - 400 K/uL    Comment: SPECIMEN CHECKED FOR CLOTS PLATELET COUNT CONFIRMED BY SMEAR CONSISTENT WITH PREVIOUS RESULT    Neutrophils Relative % 70 %   Neutro Abs 3.4 1.7 - 7.7 K/uL   Lymphocytes Relative 22 %   Lymphs Abs 1.1 0.7 - 4.0 K/uL   Monocytes Relative 4 %   Monocytes Absolute 0.2 0.1 - 1.0 K/uL   Eosinophils Relative 4 %   Eosinophils Absolute 0.2 0.0 - 0.7 K/uL   Basophils Relative 0 %   Basophils Absolute 0.0 0.0 - 0.1 K/uL    Comment: Performed at [facility], [place] [address], [place], [place] [address]  Acetaminophen level     Status: Abnormal   Collection Time: 06/04/17 12:52 AM  Result Value Ref Range   Acetaminophen (Tylenol), Serum <10 (L) 10 - 30 ug/mL    Comment:        THERAPEUTIC CONCENTRATIONS VARY SIGNIFICANTLY. A RANGE OF 10-30 ug/mL MAY BE AN EFFECTIVE CONCENTRATION FOR MANY PATIENTS. HOWEVER, SOME ARE BEST TREATED AT CONCENTRATIONS OUTSIDE THIS RANGE. ACETAMINOPHEN CONCENTRATIONS >150 ug/mL AT 4 HOURS AFTER INGESTION AND >50 ug/mL AT 12 HOURS AFTER INGESTION ARE OFTEN ASSOCIATED WITH TOXIC REACTIONS. Performed at [facility], [place] [address], [place], [place] [address]   Salicylate level     Status: None   Collection Time: 06/04/17 12:52 AM  Result Value Ref Range   Salicylate Lvl <6.9 2.8 - 30.0 mg/dL    Comment: Performed at [facility], [place] [address], [place], [place] [address]  I-Stat CG4 Lactic Acid, ED     Status: Abnormal   Collection Time:  06/04/17  1:03 AM  Result Value Ref Range   Lactic Acid, Venous 2.39 (HH) 0.5 - 1.9 mmol/L   Comment NOTIFIED PHYSICIAN   I-Stat CG4 Lactic Acid, ED     Status: Abnormal   Collection Time: 06/04/17  1:51 AM  Result Value Ref Range   Lactic Acid, Venous 2.25 (HH) 0.5 - 1.9 mmol/L   Comment NOTIFIED PHYSICIAN   CBG monitoring, ED     Status: Abnormal   Collection Time: 06/04/17  4:37 AM  Result Value Ref Range   Glucose-Capillary 241 (H) 65 - 99 mg/dL  I-Stat CG4 Lactic Acid, ED     Status: None   Collection Time: 06/04/17  4:47 AM  Result Value Ref Range   Lactic Acid, Venous  1.73 0.5 - 1.9 mmol/L  I-Stat CG4 Lactic Acid, ED     Status: None   Collection Time: 06/04/17  6:47 AM  Result Value Ref Range   Lactic Acid, Venous 1.68 0.5 - 1.9 mmol/L  CBG monitoring, ED     Status: Abnormal   Collection Time: 06/04/17  2:26 PM  Result Value Ref Range   Glucose-Capillary 378 (H) 65 - 99 mg/dL  CBG monitoring, ED     Status: Abnormal   Collection Time: 06/04/17  5:42 PM  Result Value Ref Range   Glucose-Capillary 238 (H) 65 - 99 mg/dL  CBG monitoring, ED     Status: Abnormal   Collection Time: 06/04/17  9:31 PM  Result Value Ref Range   Glucose-Capillary 250 (H) 65 - 99 mg/dL   Comment 1 Notify RN    Comment 2 Document in Chart   CBG monitoring, ED     Status: Abnormal   Collection Time: 06/05/17  8:19 AM  Result Value Ref Range   Glucose-Capillary 256 (H) 65 - 99 mg/dL  CBG monitoring, ED     Status: Abnormal   Collection Time: 06/05/17 11:53 AM  Result Value Ref Range   Glucose-Capillary 291 (H) 65 - 99 mg/dL    Current Facility-Administered Medications  Medication Dose Route Frequency Provider Last Rate Last Dose  . busPIRone (BUSPAR) tablet 15 mg  15 mg Oral BID Rolland Porter, MD   15 mg at 06/05/17 0912  . chlorproMAZINE (THORAZINE) tablet 50 mg  50 mg Oral TID Rolland Porter, MD   50 mg at 06/05/17 0914  . dicyclomine (BENTYL) tablet 20 mg  20 mg Oral TID WC PRN Rolland Porter, MD       . gabapentin (NEURONTIN) capsule 400 mg  400 mg Oral TID Rolland Porter, MD   400 mg at 06/05/17 0914  . insulin aspart (novoLOG) injection 22 Units  22 Units Subcutaneous TID WC Rolland Porter, MD   22 Units at 06/05/17 [phone]  . levETIRAcetam (KEPPRA) tablet 500 mg  500 mg Oral BID Rolland Porter, MD   500 mg at 06/05/17 0914  . lisinopril (PRINIVIL,ZESTRIL) tablet 10 mg  10 mg Oral Daily Rolland Porter, MD   10 mg at 06/05/17 2878  . ondansetron (ZOFRAN-ODT) disintegrating tablet 4 mg  4 mg Oral Q8H PRN Rolland Porter, MD      . QUEtiapine (SEROQUEL XR) 24 hr tablet 400 mg  400 mg Oral QHS Rolland Porter, MD   400 mg at 06/04/17 2202  . QUEtiapine (SEROQUEL) tablet 100 mg  100 mg Oral Daily Rolland Porter, MD   100 mg at 06/05/17 0912  . sertraline (ZOLOFT) tablet 200 mg  200 mg Oral Daily Rolland Porter, MD   200 mg at 06/05/17 0915  . simvastatin (ZOCOR) tablet 10 mg  10 mg Oral Liliane Channel, MD   10 mg at 06/04/17 2202  . topiramate (TOPAMAX) tablet 100 mg  100 mg Oral BID Rolland Porter, MD   100 mg at 06/05/17 0916  . traZODone (DESYREL) tablet 50 mg  50 mg Oral QHS Rolland Porter, MD   50 mg at 06/04/17 2135   Current Outpatient Medications  Medication Sig Dispense Refill  . busPIRone (BUSPAR) 30 MG tablet Take 15 mg by mouth 2 (two) times daily.     . chlorproMAZINE (THORAZINE) 50 MG tablet Take 1 tablet (50 mg total) by mouth 3 (three) times daily. For agitation/mood control 90 tablet 0  . dicyclomine (BENTYL)  20 MG tablet Take 1 tablet (20 mg total) by mouth 3 (three) times daily with meals as needed for spasms (abdominal pain and/or diarrhea). 30 tablet 0  . gabapentin (NEURONTIN) 400 MG capsule Take 1 capsule (400 mg total) by mouth 3 (three) times daily. For agitation/diabetic neuropathy 90 capsule 0  . insulin aspart (NOVOLOG) 100 UNIT/ML injection Inject 6 Units into the skin 3 (three) times daily with meals. For diabetes management (Patient taking differently: Inject 22 Units into the skin 3 (three) times daily  with meals. For diabetes management) 10 mL 0  . insulin detemir (LEVEMIR) 100 UNIT/ML injection Inject 0.68 mLs (68 Units total) into the skin 2 (two) times daily. For diabetes management 10 mL 0  . levETIRAcetam (KEPPRA) 500 MG tablet Take 1 tablet (500 mg total) by mouth 2 (two) times daily. For mood stabilization 60 tablet 0  . lisinopril (PRINIVIL,ZESTRIL) 10 MG tablet Take 1 tablet (10 mg total) by mouth daily. For high blood pressure 6 tablet 0  . ondansetron (ZOFRAN ODT) 4 MG disintegrating tablet Take 1 tablet (4 mg total) by mouth every 8 (eight) hours as needed for nausea or vomiting. 15 tablet 0  . QUEtiapine (SEROQUEL XR) 200 MG 24 hr tablet Take 400 mg by mouth at bedtime.     Marland Kitchen QUEtiapine (SEROQUEL) 100 MG tablet Take 5 tablets (500 mg total) by mouth at bedtime. For mood control (Patient taking differently: Take 100 mg by mouth daily. ) 150 tablet 0  . sertraline (ZOLOFT) 100 MG tablet Take 200 mg by mouth daily.    . simvastatin (ZOCOR) 10 MG tablet Take 10 mg by mouth at bedtime.    . topiramate (TOPAMAX) 100 MG tablet Take 100 mg by mouth 2 (two) times daily.    . traZODone (DESYREL) 50 MG tablet Take 50 mg by mouth at bedtime.    . benzonatate (TESSALON) 100 MG capsule Take 1 capsule (100 mg total) by mouth 3 (three) times daily as needed for cough. (Patient not taking: Reported on 04/26/2017) 21 capsule 0  . cephALEXin (KEFLEX) 500 MG capsule 2 caps po bid x 7 days 28 capsule 0  . cloNIDine (CATAPRES) 0.1 MG tablet Take 1 tablet (0.1 mg total) by mouth 2 (two) times daily. For high blood pressure (Patient not taking: Reported on 05/26/2017) 12 tablet 0  . cyclobenzaprine (FLEXERIL) 5 MG tablet Take 1 tablet (5 mg total) by mouth 2 (two) times daily as needed for muscle spasms. (Patient not taking: Reported on 04/26/2017) 10 tablet 0  . dicyclomine (BENTYL) 20 MG tablet Take 1 tablet (20 mg total) by mouth every 8 (eight) hours as needed for spasms. (Patient not taking: Reported on  04/11/2017)    . Dulaglutide (TRULICITY) 1.5 NO/6.7EH SOPN Inject 1.5 mg into the skin every Friday. For diabetes management (Patient not taking: Reported on 05/26/2017)    . fenofibrate 160 MG tablet Take 1 tablet (160 mg total) by mouth daily. For high Cholesterol (Patient not taking: Reported on 04/11/2017) 7 tablet 0  . glucose blood test strip Use as instructed (Patient not taking: Reported on 05/26/2017) 100 each 12  . hydrOXYzine (ATARAX/VISTARIL) 50 MG tablet Take 1 tablet (50 mg total) by mouth 3 (three) times daily as needed for anxiety. (Patient not taking: Reported on 04/26/2017) 60 tablet 0  . magnesium oxide (MAG-OX) 400 MG tablet Take 1 tablet (400 mg total) by mouth daily. (Patient not taking: Reported on 04/11/2017) 30 tablet 0  . ondansetron (ZOFRAN  ODT) 4 MG disintegrating tablet Take 1 tablet (4 mg total) by mouth every 8 (eight) hours as needed for nausea or vomiting. (Patient not taking: Reported on 06/04/2017) 15 tablet 0  . pantoprazole (PROTONIX) 40 MG tablet Take 1 tablet (40 mg total) by mouth daily. For acid reflux (Patient not taking: Reported on 04/26/2017) 6 tablet 0  . QUEtiapine (SEROQUEL) 50 MG tablet Take 1 tablet (50 mg total) by mouth 2 (two) times daily. For agitation/mood control (Patient not taking: Reported on 04/26/2017) 60 tablet 0  . sodium bicarbonate 650 MG tablet Take 1 tablet (650 mg total) by mouth daily. (Patient not taking: Reported on 04/11/2017) 30 tablet 0    Musculoskeletal: Strength & Muscle Tone: within normal limits Gait & Station: normal Patient leans: N/A  Psychiatric Specialty Exam: Physical Exam  Nursing note and vitals reviewed. Constitutional: She is oriented to person, place, and time. She appears well-developed and well-nourished.  HENT:  Head: Normocephalic and atraumatic.  Neck: Normal range of motion.  Respiratory: Effort normal.  Musculoskeletal: Normal range of motion.  Neurological: She is alert and oriented to person, place, and  time.  Psychiatric: Her speech is normal and behavior is normal. Thought content normal. Cognition and memory are normal. She expresses impulsivity. She exhibits a depressed mood.    Review of Systems  Psychiatric/Behavioral: Positive for depression. Negative for hallucinations, memory loss, substance abuse and suicidal ideas. The patient is not nervous/anxious and does not have insomnia.   All other systems reviewed and are negative.   Blood pressure 109/62, pulse (!) 102, temperature 98.3 F (36.8 C), temperature source Oral, resp. rate 20, height 5' 4"  (1.626 m), weight 220 lb 12.8 oz (100.2 kg), last menstrual period 05/30/2017, SpO2 95 %.Body mass index is 37.9 kg/m.  General Appearance: Casual  Eye Contact:  Fair  Speech:  Clear and Coherent and Normal Rate  Volume:  Normal  Mood:  Depressed  Affect:  Congruent, Depressed and Flat  Thought Process:  Coherent, Goal Directed and Linear  Orientation:  Full (Time, Place, and Person)  Thought Content:  Logical  Suicidal Thoughts:  No  Homicidal Thoughts:  No  Memory:  Immediate;   Good Recent;   Good Remote;   Fair  Judgement:  Fair  Insight:  Fair  Psychomotor Activity:  Decreased  Concentration:  Concentration: Good and Attention Span: Good  Recall:  Good  Fund of Knowledge:  Good  Language:  Good  Akathisia:  No  Handed:  Right  AIMS (if indicated):   N/A  Assets:  Agricultural consultant Housing Social Support  ADL's:  Intact  Cognition:  WNL  Sleep:   N/A     Treatment Plan Summary: Plan Schizoaffective disorder, bipolar type ([place])  Discharge Home Take all medications as prescribed Follow up with your outpatient provider  Disposition: No evidence of imminent risk to self or others at present.   Patient does not meet criteria for psychiatric inpatient admission. Supportive therapy provided about ongoing stressors. Discussed crisis plan, support from social network, calling 911,  coming to the Emergency Department, and calling Suicide Hotline.  Ethelene Hal, NP 06/05/2017 12:24 PM   Patient seen face-to-face for psychiatric evaluation, chart reviewed and case discussed with the physician extender and developed treatment plan. Reviewed the information documented and agree with the treatment plan.  Buford Dresser, DO 06/05/17 11:20 PM

## 2017-06-05 NOTE — Discharge Instructions (Signed)
For your mental health needs, you are advised to follow up with [facility].  New and returning patients are seen at their walk-in clinic.  Walk-in hours are Monday - Friday from 8:00 am - 3:00 pm.  Walk-in patients are seen on a first come, first served basis.  Try to arrive as early as possible for he best chance of being seen the same day.  You are advised to ask them about resuming ACT Team services:       [facility]      [address]      [place], [place] [address]      [phone]

## 2017-06-10 DIAGNOSIS — R739 Hyperglycemia, unspecified: Secondary | ICD-10-CM | POA: Diagnosis not present

## 2017-06-10 DIAGNOSIS — E1165 Type 2 diabetes mellitus with hyperglycemia: Secondary | ICD-10-CM | POA: Diagnosis not present

## 2017-06-11 DIAGNOSIS — R739 Hyperglycemia, unspecified: Secondary | ICD-10-CM | POA: Diagnosis not present

## 2017-06-11 DIAGNOSIS — Z794 Long term (current) use of insulin: Secondary | ICD-10-CM | POA: Diagnosis not present

## 2017-06-11 DIAGNOSIS — N390 Urinary tract infection, site not specified: Secondary | ICD-10-CM | POA: Insufficient documentation

## 2017-06-11 DIAGNOSIS — I10 Essential (primary) hypertension: Secondary | ICD-10-CM | POA: Diagnosis not present

## 2017-06-11 DIAGNOSIS — J45909 Unspecified asthma, uncomplicated: Secondary | ICD-10-CM | POA: Diagnosis not present

## 2017-06-11 DIAGNOSIS — Z79899 Other long term (current) drug therapy: Secondary | ICD-10-CM | POA: Diagnosis not present

## 2017-06-11 DIAGNOSIS — E1165 Type 2 diabetes mellitus with hyperglycemia: Secondary | ICD-10-CM | POA: Insufficient documentation

## 2017-06-11 DIAGNOSIS — R1031 Right lower quadrant pain: Secondary | ICD-10-CM | POA: Diagnosis not present

## 2017-06-11 DIAGNOSIS — R109 Unspecified abdominal pain: Secondary | ICD-10-CM | POA: Diagnosis not present

## 2017-06-12 ENCOUNTER — Emergency Department (HOSPITAL_COMMUNITY)
Admission: EM | Admit: 2017-06-12 | Discharge: 2017-06-12 | Disposition: A | Discharge status: Home / Self Care | Payer: Medicare HMO | Attending: Emergency Medicine | Admitting: Emergency Medicine

## 2017-06-12 ENCOUNTER — Emergency Department (HOSPITAL_COMMUNITY): Payer: Medicare HMO

## 2017-06-12 ENCOUNTER — Other Ambulatory Visit: Payer: Self-pay

## 2017-06-12 ENCOUNTER — Encounter (HOSPITAL_COMMUNITY): Payer: Self-pay | Admitting: Emergency Medicine

## 2017-06-12 DIAGNOSIS — R1031 Right lower quadrant pain: Secondary | ICD-10-CM

## 2017-06-12 DIAGNOSIS — N390 Urinary tract infection, site not specified: Secondary | ICD-10-CM

## 2017-06-12 DIAGNOSIS — R739 Hyperglycemia, unspecified: Secondary | ICD-10-CM

## 2017-06-12 DIAGNOSIS — R109 Unspecified abdominal pain: Secondary | ICD-10-CM | POA: Diagnosis not present

## 2017-06-12 HISTORY — DX: Pure hypercholesterolemia, unspecified: E78.00

## 2017-06-12 HISTORY — DX: Unspecified asthma, uncomplicated: J45.909

## 2017-06-12 HISTORY — DX: Unspecified convulsions: R56.9

## 2017-06-12 LAB — BASIC METABOLIC PANEL
ANION GAP: 11 (ref 5–15)
BUN: 5 mg/dL — ABNORMAL LOW (ref 6–20)
CALCIUM: 8.9 mg/dL (ref 8.9–10.3)
CHLORIDE: 102 mmol/L (ref 101–111)
CO2: 19 mmol/L — AB (ref 22–32)
Creatinine, Ser: 0.63 mg/dL (ref 0.44–1.00)
GFR calc non Af Amer: >60 mL/min (ref >60)
GLUCOSE: 413 mg/dL — AB (ref 65–99)
Potassium: 3.9 mmol/L (ref 3.5–5.1)
Sodium: 132 mmol/L — ABNORMAL LOW (ref 135–145)

## 2017-06-12 LAB — CBC
HEMATOCRIT: 36.1 % (ref 36.0–46.0)
HEMOGLOBIN: 11.5 g/dL — AB (ref 12.0–15.0)
MCH: 25.4 pg — ABNORMAL LOW (ref 26.0–34.0)
MCHC: 31.9 g/dL (ref 30.0–36.0)
MCV: 79.7 fL (ref 78.0–100.0)
Platelets: 133 10*3/uL — ABNORMAL LOW (ref 150–400)
RBC: 4.53 MIL/uL (ref 3.87–5.11)
RDW: 15.8 % — ABNORMAL HIGH (ref 11.5–15.5)
WBC: 5 10*3/uL (ref 4.0–10.5)

## 2017-06-12 LAB — URINALYSIS, ROUTINE W REFLEX MICROSCOPIC
Bilirubin Urine: NEGATIVE
KETONES UR: NEGATIVE mg/dL
Nitrite: NEGATIVE
Protein, ur: NEGATIVE mg/dL
Specific Gravity, Urine: 1.037 — ABNORMAL HIGH (ref 1.005–1.030)
pH: 6 (ref 5.0–8.0)

## 2017-06-12 LAB — CBG MONITORING, ED
Glucose-Capillary: 410 mg/dL — ABNORMAL HIGH (ref 65–99)
Glucose-Capillary: 261 mg/dL — ABNORMAL HIGH (ref 65–99)

## 2017-06-12 LAB — I-STAT BETA HCG BLOOD, ED (MC, WL, AP ONLY)

## 2017-06-12 MED ORDER — IOPAMIDOL (ISOVUE-300) INJECTION 61%
100.0000 mL | Freq: Once | INTRAVENOUS | Status: AC | PRN
  Administered 2017-06-12: 100 mL via INTRAVENOUS

## 2017-06-12 MED ORDER — SODIUM CHLORIDE 0.9 % IV BOLUS
1000.0000 mL | Freq: Once | INTRAVENOUS | Status: AC
  Administered 2017-06-12: 1000 mL via INTRAVENOUS

## 2017-06-12 MED ORDER — CEPHALEXIN 500 MG PO CAPS: 500.0000 mg | ORAL_CAPSULE | Freq: Two times a day (BID) | ORAL | 0 refills | Status: DC

## 2017-06-12 MED ORDER — ONDANSETRON HCL 4 MG/2ML IJ SOLN
4.0000 mg | Freq: Once | INTRAMUSCULAR | Status: AC
  Administered 2017-06-12: 4 mg via INTRAVENOUS
  Filled 2017-06-12: qty 2

## 2017-06-12 MED ORDER — IOPAMIDOL (ISOVUE-300) INJECTION 61%
INTRAVENOUS | Status: AC
  Filled 2017-06-12: qty 100

## 2017-06-12 MED ORDER — INSULIN ASPART 100 UNIT/ML ~~LOC~~ SOLN
10.0000 [IU] | Freq: Once | SUBCUTANEOUS | Status: AC
  Administered 2017-06-12: 10 [IU] via SUBCUTANEOUS
  Filled 2017-06-12: qty 1

## 2017-06-12 MED ORDER — HYDROMORPHONE HCL 1 MG/ML IJ SOLN
0.5000 mg | Freq: Once | INTRAMUSCULAR | Status: AC
Start: 2017-06-12 — End: 2017-06-12
  Administered 2017-06-12: 0.5 mg via INTRAVENOUS
  Filled 2017-06-12: qty 1

## 2017-06-12 NOTE — ED Provider Notes (Signed)
[facility] DEPT Provider Note   CSN: [id number] Arrival date & time: 06/11/17  2347     History   Chief Complaint Chief Complaint  Patient presents with  . Hyperglycemia  . Abdominal Pain    HPI Tina Saunders is a 32 y.o. female.  Patient presents to the emergency department with a chief complaint of hyperglycemia and abdominal pain.  She takes insulin for her diabetes, but states that her blood sugar has been running in the 4-500s for the past several days.  She reports having some associated nausea and vomiting and right lower quadrant pain.  She states that this is new for her.  She denies any dysuria or hematuria.  Denies any cough.  She reports that she has had subjective fevers and chills.  She has not taken anything for the symptoms.  She does still have her appendix.  The history is provided by the patient. No language interpreter was used.    Past Medical History:  Diagnosis Date  . Anxiety   . Asthma   . Bipolar 1 disorder ([place])   . Cancer of abdominal wall   . Depression   . Diabetes mellitus without complication ([place])   . High cholesterol   . Hypertension   . Obesity   . Obesity   . Polycystic ovarian syndrome 07/01/2011   Patient report  . 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 Schizophrenia ([place])   . Seizures [facility])     Patient Active Problem List   Diagnosis Date Noted  . Hyperglycemia   . DKA (diabetic ketoacidoses) ([place]) 03/14/2017  . Chronic constipation   . Seizures ([place]) 12/11/2016  . Acute lower UTI 12/11/2016  . DM2 (diabetes mellitus, type 2) ([place]) 12/11/2016  . Uncontrolled diabetes mellitus ([place]) 11/03/2016  . Schizoaffective disorder, bipolar type ([place]) 11/02/2016  . Cluster B personality disorder ([place]) 11/02/2016  . Schizoaffective disorder, mixed type ([place]) 05/30/2014  . Suicidal ideation   . 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 Headache 04/15/2013  . HTN (hypertension) 04/15/2013  . Post traumatic stress disorder  12/07/2011  . CAP (community acquired pneumonia) 08/27/2011  . Chest pain 08/26/2011  . SOB (shortness of breath) 08/26/2011  . Fever 08/26/2011  . Hypokalemia 08/26/2011  . PSVT (paroxysmal supraventricular tachycardia) ([place]) 08/26/2011  . ADHD 09/23/2007  . EPIGASTRIC PAIN 09/23/2007  . Obesity, unspecified 07/30/2007  . Depression 07/30/2007  . SLEEP DISORDER 07/30/2007  . IMPAIRED FASTING GLUCOSE 07/30/2007  . FATIGUE 11/21/2006  . ABNORMAL FINDINGS, ELEVATED BP W/O HTN 11/21/2006  . METRORRHAGIA 06/13/2006  . DISORDER, MENSTRUAL NEC 06/13/2006  . DIZZINESS 06/13/2006  . POLYCYSTIC OVARIAN DISEASE 04/25/2006  . AMENORRHEA, SECONDARY 04/20/2006  . ACNE, MILD 04/20/2006  . Abdominal pain 04/20/2006    Past Surgical History:  Procedure Laterality Date  . CHOLECYSTECTOMY    . COLONOSCOPY WITH PROPOFOL N/A 03/08/2017   Procedure: COLONOSCOPY WITH PROPOFOL;  Surgeon: Milus Banister, MD;  Location: [facility] ENDOSCOPY;  Service: Endoscopy;  Laterality: N/A;  . HERNIA REPAIR    . Ovarian Cyst Excision    . VARICOSE VEIN SURGERY       OB History    Gravida  0   Para      Term      Preterm      AB      Living        SAB      TAB      Ectopic  Multiple      Live Births               Home Medications    Prior to Admission medications   Medication Sig Start Date End Date Taking? Authorizing Provider  benzonatate (TESSALON) 100 MG capsule Take 1 capsule (100 mg total) by mouth 3 (three) times daily as needed for cough. Patient not taking: Reported on 04/26/2017 03/30/17   Orpah Greek, MD  busPIRone (BUSPAR) 30 MG tablet Take 15 mg by mouth 2 (two) times daily.     [provider]  cephALEXin (KEFLEX) 500 MG capsule 2 caps po bid x 7 days 06/01/17   Street, [place], PA-C  chlorproMAZINE (THORAZINE) 50 MG tablet Take 1 tablet (50 mg total) by mouth 3 (three) times daily. For agitation/mood control 02/09/17   Lindell Spar I, NP  cloNIDine  (CATAPRES) 0.1 MG tablet Take 1 tablet (0.1 mg total) by mouth 2 (two) times daily. For high blood pressure Patient not taking: Reported on 05/26/2017 02/09/17   Lindell Spar I, NP  cyclobenzaprine (FLEXERIL) 5 MG tablet Take 1 tablet (5 mg total) by mouth 2 (two) times daily as needed for muscle spasms. Patient not taking: Reported on 04/26/2017 03/16/17   Florencia Reasons, MD  dicyclomine (BENTYL) 20 MG tablet Take 1 tablet (20 mg total) by mouth every 8 (eight) hours as needed for spasms. Patient not taking: Reported on 04/11/2017 02/09/17   Lindell Spar I, NP  dicyclomine (BENTYL) 20 MG tablet Take 1 tablet (20 mg total) by mouth 3 (three) times daily with meals as needed for spasms (abdominal pain and/or diarrhea). 05/26/17   Street, [place], PA-C  Dulaglutide (TRULICITY) 1.5 WJ/1.9JY SOPN Inject 1.5 mg into the skin every Friday. For diabetes management Patient not taking: Reported on 05/26/2017 02/09/17   Lindell Spar I, NP  fenofibrate 160 MG tablet Take 1 tablet (160 mg total) by mouth daily. For high Cholesterol Patient not taking: Reported on 04/11/2017 02/10/17   Lindell Spar I, NP  gabapentin (NEURONTIN) 400 MG capsule Take 1 capsule (400 mg total) by mouth 3 (three) times daily. For agitation/diabetic neuropathy 02/09/17   Lindell Spar I, NP  glucose blood test strip Use as instructed Patient not taking: Reported on 05/26/2017 04/26/17   Montine Circle, PA-C  hydrOXYzine (ATARAX/VISTARIL) 50 MG tablet Take 1 tablet (50 mg total) by mouth 3 (three) times daily as needed for anxiety. Patient not taking: Reported on 04/26/2017 02/09/17   Lindell Spar I, NP  insulin aspart (NOVOLOG) 100 UNIT/ML injection Inject 6 Units into the skin 3 (three) times daily with meals. For diabetes management Patient taking differently: Inject 22 Units into the skin 3 (three) times daily with meals. For diabetes management 02/09/17   Lindell Spar I, NP  insulin detemir (LEVEMIR) 100 UNIT/ML injection Inject 0.68 mLs (68 Units total)  into the skin 2 (two) times daily. For diabetes management 03/16/17   Florencia Reasons, MD  levETIRAcetam (KEPPRA) 500 MG tablet Take 1 tablet (500 mg total) by mouth 2 (two) times daily. For mood stabilization 02/09/17   Lindell Spar I, NP  lisinopril (PRINIVIL,ZESTRIL) 10 MG tablet Take 1 tablet (10 mg total) by mouth daily. For high blood pressure 02/09/17   Lindell Spar I, NP  magnesium oxide (MAG-OX) 400 MG tablet Take 1 tablet (400 mg total) by mouth daily. Patient not taking: Reported on 04/11/2017 03/16/17   Florencia Reasons, MD  ondansetron (ZOFRAN ODT) 4 MG disintegrating tablet Take 1 tablet (4  mg total) by mouth every 8 (eight) hours as needed for nausea or vomiting. Patient not taking: Reported on 06/04/2017 05/26/17   Street, [place], PA-C  ondansetron (ZOFRAN ODT) 4 MG disintegrating tablet Take 1 tablet (4 mg total) by mouth every 8 (eight) hours as needed for nausea or vomiting. 06/01/17   Street, [place], PA-C  pantoprazole (PROTONIX) 40 MG tablet Take 1 tablet (40 mg total) by mouth daily. For acid reflux Patient not taking: Reported on 04/26/2017 02/09/17   Lindell Spar I, NP  QUEtiapine (SEROQUEL XR) 200 MG 24 hr tablet Take 400 mg by mouth at bedtime.     [provider]  QUEtiapine (SEROQUEL) 100 MG tablet Take 5 tablets (500 mg total) by mouth at bedtime. For mood control Patient taking differently: Take 100 mg by mouth daily.  02/09/17   Lindell Spar I, NP  QUEtiapine (SEROQUEL) 50 MG tablet Take 1 tablet (50 mg total) by mouth 2 (two) times daily. For agitation/mood control Patient not taking: Reported on 04/26/2017 02/09/17   Lindell Spar I, NP  sertraline (ZOLOFT) 100 MG tablet Take 200 mg by mouth daily.    [provider]  simvastatin (ZOCOR) 10 MG tablet Take 10 mg by mouth at bedtime.    [provider]  sodium bicarbonate 650 MG tablet Take 1 tablet (650 mg total) by mouth daily. Patient not taking: Reported on 04/11/2017 03/16/17   Florencia Reasons, MD  topiramate (TOPAMAX)  100 MG tablet Take 100 mg by mouth 2 (two) times daily.    [provider]  traZODone (DESYREL) 50 MG tablet Take 50 mg by mouth at bedtime.    [provider]    Family History Family History  Problem Relation Age of Onset  . Coronary artery disease Maternal Grandmother   . Diabetes type II Maternal Grandmother   . Cancer Maternal Grandmother   . Hypertension Mother   . Hypertension Father     Social History Social History   Tobacco Use  . Smoking status: Never Smoker  . Smokeless tobacco: Never Used  Substance Use Topics  . Alcohol use: No  . Drug use: No     Allergies   Fish-derived products; Geodon [ziprasidone hcl]; Haldol [haloperidol lactate]; Buprenorphine hcl; Compazine [prochlorperazine]; Morphine and related; and Toradol [ketorolac tromethamine]   Review of Systems Review of Systems  All other systems reviewed and are negative.    Physical Exam Updated Vital Signs BP (!) 160/114 (BP Location: Right Arm)   Pulse 95   Temp 98.7 F (37.1 C) (Oral)   Resp 16   Ht 5\' 4"  (1.626 m)   Wt 101.2 kg (223 lb)   LMP 05/30/2017 (Approximate)   SpO2 99%   BMI 38.28 kg/m   Physical Exam  Constitutional: She is oriented to person, place, and time. She appears well-developed and well-nourished.  HENT:  Head: Normocephalic and atraumatic.  Eyes: Pupils are equal, round, and reactive to light. Conjunctivae and EOM are normal.  Neck: Normal range of motion. Neck supple.  Cardiovascular: Normal rate and regular rhythm. Exam reveals no gallop and no friction rub.  No murmur heard. Pulmonary/Chest: Effort normal and breath sounds normal. No respiratory distress. She has no wheezes. She has no rales. She exhibits no tenderness.  Abdominal: Soft. Bowel sounds are normal. She exhibits no distension and no mass. There is tenderness in the right lower quadrant. There is no rebound and no guarding.  Musculoskeletal: Normal range of motion. She exhibits no  edema  or tenderness.  Neurological: She is alert and oriented to person, place, and time.  Skin: Skin is warm and dry.  Psychiatric: She has a normal mood and affect. Her behavior is normal. Judgment and thought content normal.  Nursing note and vitals reviewed.    ED Treatments / Results  Labs (all labs ordered are listed, but only abnormal results are displayed) Labs Reviewed  BASIC METABOLIC PANEL - Abnormal; Notable for the following components:      Result Value   Sodium 132 (*)    CO2 19 (*)    Glucose, Bld 413 (*)    BUN 5 (*)    All other components within normal limits  CBC - Abnormal; Notable for the following components:   Hemoglobin 11.5 (*)    MCH 25.4 (*)    RDW 15.8 (*)    Platelets 133 (*)    All other components within normal limits  URINALYSIS, ROUTINE W REFLEX MICROSCOPIC - Abnormal; Notable for the following components:   APPearance HAZY (*)    Specific Gravity, Urine 1.037 (*)    Glucose, UA >=500 (*)    Hgb urine dipstick MODERATE (*)    Leukocytes, UA SMALL (*)    Bacteria, UA RARE (*)    All other components within normal limits  CBG MONITORING, ED - Abnormal; Notable for the following components:   Glucose-Capillary 410 (*)    All other components within normal limits  CBG MONITORING, ED  I-STAT BETA HCG BLOOD, ED ([facility], [facility], [facility] ONLY)    EKG None  Radiology No results found.  Procedures Procedures (including critical care time)  Medications Ordered in ED Medications  sodium chloride 0.9 % bolus 1,000 mL (1,000 mLs Intravenous New Bag/Given 06/12/17 0414)  ondansetron (ZOFRAN) injection 4 mg (4 mg Intravenous Given 06/12/17 0414)  insulin aspart (novoLOG) injection 10 Units (10 Units Subcutaneous Given 06/12/17 0414)  HYDROmorphone (DILAUDID) injection 0.5 mg (0.5 mg Intravenous Given 06/12/17 0414)     Initial Impression / Assessment and Plan / ED Course  I have reviewed the triage vital signs and the nursing notes.  Pertinent labs &  imaging results that were available during my care of the patient were reviewed by me and considered in my medical decision making (see chart for details).     Patient with hyperglycemia and right lower quadrant abdominal pain.  Her urine does look dirty, however she denies any urinary complaints.  She does have localized tenderness in the right lower quadrant.  She does still have her appendix.  She reports some associated fevers, chills, and nausea with an episode of vomiting.  Will check CT to rule out appendicitis.  We will give fluids and insulin to bring down sugar.  No evidence of appendicitis.  She does have bacteria in her urine, this could explain her abdominal symptoms.  Will treat with Keflex.  Glucose is trending down nicely.  We will discharged home with Keflex.  Patient understands and agrees with plan.  She is stable and ready for discharge. Final Clinical Impressions(s) / ED Diagnoses   Final diagnoses:  Hyperglycemia  RLQ abdominal pain  Urinary tract infection without hematuria, site unspecified    ED Discharge Orders        Ordered    cephALEXin (KEFLEX) 500 MG capsule  2 times daily     06/12/17 0623       Montine Circle, PA-C 06/12/17 3875    Rolland Porter, MD 06/12/17 [phone]

## 2017-06-15 ENCOUNTER — Encounter (HOSPITAL_COMMUNITY): Payer: Self-pay | Admitting: Emergency Medicine

## 2017-06-15 ENCOUNTER — Other Ambulatory Visit: Payer: Self-pay

## 2017-06-15 DIAGNOSIS — R1033 Periumbilical pain: Secondary | ICD-10-CM | POA: Diagnosis not present

## 2017-06-15 DIAGNOSIS — I10 Essential (primary) hypertension: Secondary | ICD-10-CM | POA: Diagnosis not present

## 2017-06-15 DIAGNOSIS — E1165 Type 2 diabetes mellitus with hyperglycemia: Secondary | ICD-10-CM | POA: Diagnosis not present

## 2017-06-15 DIAGNOSIS — J45909 Unspecified asthma, uncomplicated: Secondary | ICD-10-CM | POA: Diagnosis not present

## 2017-06-15 DIAGNOSIS — Z794 Long term (current) use of insulin: Secondary | ICD-10-CM | POA: Diagnosis not present

## 2017-06-15 DIAGNOSIS — R1010 Upper abdominal pain, unspecified: Secondary | ICD-10-CM | POA: Diagnosis not present

## 2017-06-15 DIAGNOSIS — Z79899 Other long term (current) drug therapy: Secondary | ICD-10-CM | POA: Diagnosis not present

## 2017-06-15 DIAGNOSIS — R5383 Other fatigue: Secondary | ICD-10-CM | POA: Insufficient documentation

## 2017-06-15 LAB — CBG MONITORING, ED: GLUCOSE-CAPILLARY: 521 mg/dL — AB (ref 65–99)

## 2017-06-15 NOTE — ED Triage Notes (Signed)
Pt says she feels like she is going into DKA  Sxs include fatigue, stomach ache, dry mouth  Pt states she took her insulin at 2130  CBG at home was 595

## 2017-06-16 ENCOUNTER — Emergency Department (HOSPITAL_COMMUNITY)
Admission: EM | Admit: 2017-06-16 | Discharge: 2017-06-16 | Disposition: A | Discharge status: Home / Self Care | Payer: Medicare HMO | Attending: Emergency Medicine | Admitting: Emergency Medicine

## 2017-06-16 DIAGNOSIS — E1165 Type 2 diabetes mellitus with hyperglycemia: Secondary | ICD-10-CM | POA: Diagnosis not present

## 2017-06-16 DIAGNOSIS — R739 Hyperglycemia, unspecified: Secondary | ICD-10-CM

## 2017-06-16 LAB — CBC
HEMATOCRIT: 36.7 % (ref 36.0–46.0)
HEMOGLOBIN: 11.8 g/dL — AB (ref 12.0–15.0)
MCH: 25.8 pg — AB (ref 26.0–34.0)
MCHC: 32.2 g/dL (ref 30.0–36.0)
MCV: 80.3 fL (ref 78.0–100.0)
Platelets: 115 10*3/uL — ABNORMAL LOW (ref 150–400)
RBC: 4.57 MIL/uL (ref 3.87–5.11)
RDW: 15.8 % — AB (ref 11.5–15.5)
WBC: 5.2 10*3/uL (ref 4.0–10.5)

## 2017-06-16 LAB — URINALYSIS, ROUTINE W REFLEX MICROSCOPIC
BACTERIA UA: NONE SEEN
Bilirubin Urine: NEGATIVE
Glucose, UA: >1000 mg/dL — AB
Ketones, ur: NEGATIVE mg/dL
Nitrite: NEGATIVE
PH: 6 (ref 5.0–8.0)
Protein, ur: NEGATIVE mg/dL

## 2017-06-16 LAB — CBG MONITORING, ED
GLUCOSE-CAPILLARY: 510 mg/dL — AB (ref 65–99)
GLUCOSE-CAPILLARY: 437 mg/dL — AB (ref 65–99)
Glucose-Capillary: 349 mg/dL — ABNORMAL HIGH (ref 65–99)

## 2017-06-16 LAB — BASIC METABOLIC PANEL
ANION GAP: 13 (ref 5–15)
BUN: 6 mg/dL (ref 6–20)
CALCIUM: 9 mg/dL (ref 8.9–10.3)
CO2: 19 mmol/L — ABNORMAL LOW (ref 22–32)
Chloride: 99 mmol/L — ABNORMAL LOW (ref 101–111)
Creatinine, Ser: 0.53 mg/dL (ref 0.44–1.00)
GFR calc Af Amer: >60 mL/min (ref >60)
Glucose, Bld: 540 mg/dL (ref 65–99)
POTASSIUM: 3.9 mmol/L (ref 3.5–5.1)
SODIUM: 131 mmol/L — AB (ref 135–145)

## 2017-06-16 LAB — POC URINE PREG, ED: PREG TEST UR: NEGATIVE

## 2017-06-16 MED ORDER — INSULIN ASPART 100 UNIT/ML ~~LOC~~ SOLN
10.0000 [IU] | Freq: Once | SUBCUTANEOUS | Status: AC
  Administered 2017-06-16: 10 [IU] via SUBCUTANEOUS
  Filled 2017-06-16: qty 1

## 2017-06-16 MED ORDER — METOCLOPRAMIDE HCL 5 MG/ML IJ SOLN
10.0000 mg | Freq: Once | INTRAMUSCULAR | Status: AC
  Administered 2017-06-16: 10 mg via INTRAVENOUS
  Filled 2017-06-16: qty 2

## 2017-06-16 MED ORDER — INSULIN ASPART 100 UNIT/ML ~~LOC~~ SOLN
5.0000 [IU] | Freq: Once | SUBCUTANEOUS | Status: AC
  Administered 2017-06-16: 5 [IU] via SUBCUTANEOUS
  Filled 2017-06-16: qty 1

## 2017-06-16 MED ORDER — SODIUM CHLORIDE 0.9 % IV BOLUS
1000.0000 mL | Freq: Once | INTRAVENOUS | Status: AC
  Administered 2017-06-16: 1000 mL via INTRAVENOUS

## 2017-06-16 NOTE — ED Provider Notes (Signed)
[facility] DEPT Provider Note   CSN: [id number] Arrival date & time: 06/15/17  2250     History   Chief Complaint Chief Complaint  Patient presents with  . Hyperglycemia    HPI Tina Saunders is a 32 y.o. female.  Patient presents with concern for elevated blood sugar, finding it to be over 500 at home. She has abdominal pain, nausea with vomiting x 2 today. No fever, SOB. She feels excessively fatigued and is concerned she is going into DKA. She states she is compliant with her insulin and there have been no recent changes. She also reports her blood sugars are not well controlled and that levels in the 200's is normal for her. She reports her last A1c was 13.   The history is provided by the patient. No language interpreter was used.  Hyperglycemia  Associated symptoms: abdominal pain, fatigue, nausea and vomiting   Associated symptoms: no chest pain, no diaphoresis, no fever and no shortness of breath     Past Medical History:  Diagnosis Date  . Anxiety   . Asthma   . Bipolar 1 disorder ([place])   . Cancer of abdominal wall   . Depression   . Diabetes mellitus without complication ([place])   . High cholesterol   . Hypertension   . Obesity   . Obesity   . Polycystic ovarian syndrome 07/01/2011   Patient report  . 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 Schizophrenia ([place])   . Seizures [facility])     Patient Active Problem List   Diagnosis Date Noted  . Hyperglycemia   . DKA (diabetic ketoacidoses) ([place]) 03/14/2017  . Chronic constipation   . Seizures ([place]) 12/11/2016  . Acute lower UTI 12/11/2016  . DM2 (diabetes mellitus, type 2) ([place]) 12/11/2016  . Uncontrolled diabetes mellitus ([place]) 11/03/2016  . Schizoaffective disorder, bipolar type ([place]) 11/02/2016  . Cluster B personality disorder ([place]) 11/02/2016  . Schizoaffective disorder, mixed type ([place]) 05/30/2014  . Suicidal ideation   . 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 Headache 04/15/2013  . HTN  (hypertension) 04/15/2013  . Post traumatic stress disorder 12/07/2011  . CAP (community acquired pneumonia) 08/27/2011  . Chest pain 08/26/2011  . SOB (shortness of breath) 08/26/2011  . Fever 08/26/2011  . Hypokalemia 08/26/2011  . PSVT (paroxysmal supraventricular tachycardia) ([place]) 08/26/2011  . ADHD 09/23/2007  . EPIGASTRIC PAIN 09/23/2007  . Obesity, unspecified 07/30/2007  . Depression 07/30/2007  . SLEEP DISORDER 07/30/2007  . IMPAIRED FASTING GLUCOSE 07/30/2007  . FATIGUE 11/21/2006  . ABNORMAL FINDINGS, ELEVATED BP W/O HTN 11/21/2006  . METRORRHAGIA 06/13/2006  . DISORDER, MENSTRUAL NEC 06/13/2006  . DIZZINESS 06/13/2006  . POLYCYSTIC OVARIAN DISEASE 04/25/2006  . AMENORRHEA, SECONDARY 04/20/2006  . ACNE, MILD 04/20/2006  . Abdominal pain 04/20/2006    Past Surgical History:  Procedure Laterality Date  . CHOLECYSTECTOMY    . COLONOSCOPY WITH PROPOFOL N/A 03/08/2017   Procedure: COLONOSCOPY WITH PROPOFOL;  Surgeon: Milus Banister, MD;  Location: [facility] ENDOSCOPY;  Service: Endoscopy;  Laterality: N/A;  . HERNIA REPAIR    . Ovarian Cyst Excision    . VARICOSE VEIN SURGERY       OB History    Gravida  0   Para      Term      Preterm      AB      Living        SAB      TAB  Ectopic      Multiple      Live Births               Home Medications    Prior to Admission medications   Medication Sig Start Date End Date Taking? Authorizing Provider  benzonatate (TESSALON) 100 MG capsule Take 1 capsule (100 mg total) by mouth 3 (three) times daily as needed for cough. Patient not taking: Reported on 04/26/2017 03/30/17   Orpah Greek, MD  busPIRone (BUSPAR) 30 MG tablet Take 15 mg by mouth 2 (two) times daily.     [provider]  cephALEXin (KEFLEX) 500 MG capsule Take 1 capsule (500 mg total) by mouth 2 (two) times daily. 06/12/17   Montine Circle, PA-C  chlorproMAZINE (THORAZINE) 50 MG tablet Take 1 tablet (50 mg total) by  mouth 3 (three) times daily. For agitation/mood control 02/09/17   Lindell Spar I, NP  cloNIDine (CATAPRES) 0.1 MG tablet Take 1 tablet (0.1 mg total) by mouth 2 (two) times daily. For high blood pressure Patient not taking: Reported on 05/26/2017 02/09/17   Lindell Spar I, NP  cyclobenzaprine (FLEXERIL) 5 MG tablet Take 1 tablet (5 mg total) by mouth 2 (two) times daily as needed for muscle spasms. Patient not taking: Reported on 04/26/2017 03/16/17   Florencia Reasons, MD  dicyclomine (BENTYL) 20 MG tablet Take 1 tablet (20 mg total) by mouth every 8 (eight) hours as needed for spasms. Patient not taking: Reported on 04/11/2017 02/09/17   Lindell Spar I, NP  dicyclomine (BENTYL) 20 MG tablet Take 1 tablet (20 mg total) by mouth 3 (three) times daily with meals as needed for spasms (abdominal pain and/or diarrhea). 05/26/17   Street, [place], PA-C  Dulaglutide (TRULICITY) 1.5 IO/2.7OJ SOPN Inject 1.5 mg into the skin every Friday. For diabetes management Patient not taking: Reported on 05/26/2017 02/09/17   Lindell Spar I, NP  fenofibrate 160 MG tablet Take 1 tablet (160 mg total) by mouth daily. For high Cholesterol Patient not taking: Reported on 04/11/2017 02/10/17   Lindell Spar I, NP  gabapentin (NEURONTIN) 400 MG capsule Take 1 capsule (400 mg total) by mouth 3 (three) times daily. For agitation/diabetic neuropathy 02/09/17   Lindell Spar I, NP  glucose blood test strip Use as instructed Patient not taking: Reported on 05/26/2017 04/26/17   Montine Circle, PA-C  hydrOXYzine (ATARAX/VISTARIL) 50 MG tablet Take 1 tablet (50 mg total) by mouth 3 (three) times daily as needed for anxiety. Patient not taking: Reported on 04/26/2017 02/09/17   Lindell Spar I, NP  insulin aspart (NOVOLOG) 100 UNIT/ML injection Inject 6 Units into the skin 3 (three) times daily with meals. For diabetes management Patient taking differently: Inject 22 Units into the skin 3 (three) times daily with meals. For diabetes management 02/09/17   Lindell Spar I, NP  insulin detemir (LEVEMIR) 100 UNIT/ML injection Inject 0.68 mLs (68 Units total) into the skin 2 (two) times daily. For diabetes management 03/16/17   Florencia Reasons, MD  levETIRAcetam (KEPPRA) 500 MG tablet Take 1 tablet (500 mg total) by mouth 2 (two) times daily. For mood stabilization 02/09/17   Lindell Spar I, NP  lisinopril (PRINIVIL,ZESTRIL) 10 MG tablet Take 1 tablet (10 mg total) by mouth daily. For high blood pressure 02/09/17   Lindell Spar I, NP  magnesium oxide (MAG-OX) 400 MG tablet Take 1 tablet (400 mg total) by mouth daily. Patient not taking: Reported on 04/11/2017 03/16/17   Florencia Reasons, MD  ondansetron (ZOFRAN ODT) 4 MG disintegrating tablet Take 1 tablet (4 mg total) by mouth every 8 (eight) hours as needed for nausea or vomiting. Patient not taking: Reported on 06/04/2017 05/26/17   Street, [place], PA-C  ondansetron (ZOFRAN ODT) 4 MG disintegrating tablet Take 1 tablet (4 mg total) by mouth every 8 (eight) hours as needed for nausea or vomiting. 06/01/17   Street, [place], PA-C  pantoprazole (PROTONIX) 40 MG tablet Take 1 tablet (40 mg total) by mouth daily. For acid reflux Patient not taking: Reported on 04/26/2017 02/09/17   Lindell Spar I, NP  QUEtiapine (SEROQUEL XR) 200 MG 24 hr tablet Take 400 mg by mouth at bedtime.     [provider]  QUEtiapine (SEROQUEL) 100 MG tablet Take 5 tablets (500 mg total) by mouth at bedtime. For mood control Patient taking differently: Take 100 mg by mouth daily.  02/09/17   Lindell Spar I, NP  QUEtiapine (SEROQUEL) 50 MG tablet Take 1 tablet (50 mg total) by mouth 2 (two) times daily. For agitation/mood control Patient not taking: Reported on 04/26/2017 02/09/17   Lindell Spar I, NP  sertraline (ZOLOFT) 100 MG tablet Take 200 mg by mouth daily.    [provider]  simvastatin (ZOCOR) 10 MG tablet Take 10 mg by mouth at bedtime.    [provider]  sodium bicarbonate 650 MG tablet Take 1 tablet (650 mg total) by mouth  daily. Patient not taking: Reported on 04/11/2017 03/16/17   Florencia Reasons, MD  topiramate (TOPAMAX) 100 MG tablet Take 100 mg by mouth 2 (two) times daily.    [provider]  traZODone (DESYREL) 50 MG tablet Take 50 mg by mouth at bedtime.    [provider]    Family History Family History  Problem Relation Age of Onset  . Coronary artery disease Maternal Grandmother   . Diabetes type II Maternal Grandmother   . Cancer Maternal Grandmother   . Hypertension Mother   . Hypertension Father     Social History Social History   Tobacco Use  . Smoking status: Never Smoker  . Smokeless tobacco: Never Used  Substance Use Topics  . Alcohol use: No  . Drug use: No     Allergies   Fish-derived products; Geodon [ziprasidone hcl]; Haldol [haloperidol lactate]; Buprenorphine hcl; Compazine [prochlorperazine]; Morphine and related; and Toradol [ketorolac tromethamine]   Review of Systems Review of Systems  Constitutional: Positive for fatigue. Negative for chills, diaphoresis and fever.  HENT: Negative.   Respiratory: Negative.  Negative for shortness of breath.   Cardiovascular: Negative.  Negative for chest pain.  Gastrointestinal: Positive for abdominal pain, nausea and vomiting.  Musculoskeletal: Negative.   Skin: Negative.   Neurological: Negative.  Negative for syncope and headaches.     Physical Exam Updated Vital Signs BP (!) 140/93 (BP Location: Left Arm)   Pulse 97   Temp 98.1 F (36.7 C) (Oral)   Resp 16   LMP 05/30/2017 (Approximate) Comment: negative beta HCG 06/12/17  SpO2 98%   Physical Exam  Constitutional: She is oriented to person, place, and time. She appears well-developed and well-nourished. No distress.  HENT:  Head: Normocephalic.  Mouth/Throat: Oropharynx is clear and moist.  Neck: Normal range of motion. Neck supple.  Cardiovascular: Normal rate and regular rhythm.  Pulmonary/Chest: Effort normal and breath sounds normal. She has no  wheezes. She has no rales.  Abdominal: Soft. Bowel sounds are normal. There is tenderness (tenderness is limited to periumbilical and upper  abdomen). There is no rebound and no guarding.  Musculoskeletal: Normal range of motion.  Neurological: She is alert and oriented to person, place, and time.  Skin: Skin is warm and dry. No rash noted.  Psychiatric: She has a normal mood and affect.     ED Treatments / Results  Labs (all labs ordered are listed, but only abnormal results are displayed) Labs Reviewed  BASIC METABOLIC PANEL - Abnormal; Notable for the following components:      Result Value   Sodium 131 (*)    Chloride 99 (*)    CO2 19 (*)    Glucose, Bld 540 (*)    All other components within normal limits  CBC - Abnormal; Notable for the following components:   Hemoglobin 11.8 (*)    MCH 25.8 (*)    RDW 15.8 (*)    Platelets 115 (*)    All other components within normal limits  CBG MONITORING, ED - Abnormal; Notable for the following components:   Glucose-Capillary 521 (*)    All other components within normal limits  CBG MONITORING, ED - Abnormal; Notable for the following components:   Glucose-Capillary 510 (*)    All other components within normal limits  URINALYSIS, ROUTINE W REFLEX MICROSCOPIC  POC URINE PREG, ED  CBG MONITORING, ED    EKG None  Radiology No results found.  Procedures Procedures (including critical care time)  Medications Ordered in ED Medications  sodium chloride 0.9 % bolus 1,000 mL (1,000 mLs Intravenous New Bag/Given 06/16/17 0250)  sodium chloride 0.9 % bolus 1,000 mL (has no administration in time range)  insulin aspart (novoLOG) injection 10 Units (10 Units Subcutaneous Given 06/16/17 0250)     Initial Impression / Assessment and Plan / ED Course  I have reviewed the triage vital signs and the nursing notes.  Pertinent labs & imaging results that were available during my care of the patient were reviewed by me and considered in  my medical decision making (see chart for details).     Patient presents concerned about elevated blood sugar over 500. She reports nausea, vomiting, fatigue and concern for "heading into DKA".   She is stable on exam and lab studies - no evidence DKA. CBG initially 521 that responds to fluids and SQ insulin, total of 10 units, decreasing to 349. Additional 5 units insulin SQ provided. She is felt stable for discharge home.   Final Clinical Impressions(s) / ED Diagnoses   Final diagnoses:  None   1. Hyperglycemia   ED Discharge Orders    None       Charlann Lange, PA-C 06/16/17 0450    Shanon Rosser, MD 06/16/17 [phone]

## 2017-07-03 ENCOUNTER — Emergency Department (HOSPITAL_COMMUNITY)
Admission: EM | Admit: 2017-07-03 | Discharge: 2017-07-03 | Disposition: A | Discharge status: Home / Self Care | Payer: Medicare HMO | Attending: Emergency Medicine | Admitting: Emergency Medicine

## 2017-07-03 ENCOUNTER — Encounter (HOSPITAL_COMMUNITY): Payer: Self-pay

## 2017-07-03 DIAGNOSIS — E1165 Type 2 diabetes mellitus with hyperglycemia: Secondary | ICD-10-CM | POA: Insufficient documentation

## 2017-07-03 DIAGNOSIS — I10 Essential (primary) hypertension: Secondary | ICD-10-CM | POA: Insufficient documentation

## 2017-07-03 DIAGNOSIS — J45909 Unspecified asthma, uncomplicated: Secondary | ICD-10-CM | POA: Insufficient documentation

## 2017-07-03 DIAGNOSIS — R197 Diarrhea, unspecified: Secondary | ICD-10-CM | POA: Diagnosis not present

## 2017-07-03 DIAGNOSIS — R109 Unspecified abdominal pain: Secondary | ICD-10-CM | POA: Insufficient documentation

## 2017-07-03 DIAGNOSIS — Z794 Long term (current) use of insulin: Secondary | ICD-10-CM | POA: Insufficient documentation

## 2017-07-03 DIAGNOSIS — Z79899 Other long term (current) drug therapy: Secondary | ICD-10-CM | POA: Diagnosis not present

## 2017-07-03 DIAGNOSIS — R739 Hyperglycemia, unspecified: Secondary | ICD-10-CM

## 2017-07-03 DIAGNOSIS — E7800 Pure hypercholesterolemia, unspecified: Secondary | ICD-10-CM | POA: Diagnosis not present

## 2017-07-03 DIAGNOSIS — R112 Nausea with vomiting, unspecified: Secondary | ICD-10-CM | POA: Insufficient documentation

## 2017-07-03 LAB — I-STAT BETA HCG BLOOD, ED (MC, WL, AP ONLY): I-stat hCG, quantitative: <5 m[IU]/mL (ref <5)

## 2017-07-03 LAB — BASIC METABOLIC PANEL
ANION GAP: 13 (ref 5–15)
BUN: 5 mg/dL — ABNORMAL LOW (ref 6–20)
CO2: 21 mmol/L — AB (ref 22–32)
Calcium: 8.9 mg/dL (ref 8.9–10.3)
Chloride: 99 mmol/L — ABNORMAL LOW (ref 101–111)
Creatinine, Ser: 0.45 mg/dL (ref 0.44–1.00)
GFR calc Af Amer: >60 mL/min (ref >60)
GFR calc non Af Amer: >60 mL/min (ref >60)
GLUCOSE: 444 mg/dL — AB (ref 65–99)
POTASSIUM: 4 mmol/L (ref 3.5–5.1)
Sodium: 133 mmol/L — ABNORMAL LOW (ref 135–145)

## 2017-07-03 LAB — URINALYSIS, ROUTINE W REFLEX MICROSCOPIC
Bacteria, UA: NONE SEEN
Bilirubin Urine: NEGATIVE
Glucose, UA: >=500 mg/dL — AB
Ketones, ur: NEGATIVE mg/dL
Leukocytes, UA: NEGATIVE
Nitrite: NEGATIVE
Protein, ur: NEGATIVE mg/dL
Specific Gravity, Urine: 1.036 — ABNORMAL HIGH (ref 1.005–1.030)
pH: 5 (ref 5.0–8.0)

## 2017-07-03 LAB — CBC
HCT: 35.3 % — ABNORMAL LOW (ref 36.0–46.0)
Hemoglobin: 11.2 g/dL — ABNORMAL LOW (ref 12.0–15.0)
MCH: 25.5 pg — ABNORMAL LOW (ref 26.0–34.0)
MCHC: 31.7 g/dL (ref 30.0–36.0)
MCV: 80.2 fL (ref 78.0–100.0)
Platelets: 118 K/uL — ABNORMAL LOW (ref 150–400)
RBC: 4.4 MIL/uL (ref 3.87–5.11)
RDW: 15.5 % (ref 11.5–15.5)
WBC: 4.4 K/uL (ref 4.0–10.5)

## 2017-07-03 LAB — HEPATIC FUNCTION PANEL
ALT: 31 U/L (ref 14–54)
AST: 43 U/L — ABNORMAL HIGH (ref 15–41)
Albumin: 4.1 g/dL (ref 3.5–5.0)
Alkaline Phosphatase: 119 U/L (ref 38–126)
Bilirubin, Direct: 0.1 mg/dL (ref 0.1–0.5)
Indirect Bilirubin: 0.2 mg/dL — ABNORMAL LOW (ref 0.3–0.9)
Total Bilirubin: 0.3 mg/dL (ref 0.3–1.2)
Total Protein: 7.8 g/dL (ref 6.5–8.1)

## 2017-07-03 LAB — CBG MONITORING, ED
GLUCOSE-CAPILLARY: 457 mg/dL — AB (ref 65–99)
Glucose-Capillary: 257 mg/dL — ABNORMAL HIGH (ref 65–99)

## 2017-07-03 LAB — LIPASE, BLOOD: LIPASE: 30 U/L (ref 11–51)

## 2017-07-03 MED ORDER — INSULIN ASPART 100 UNIT/ML ~~LOC~~ SOLN
10.0000 [IU] | Freq: Once | SUBCUTANEOUS | Status: AC
  Administered 2017-07-03: 10 [IU] via INTRAVENOUS
  Filled 2017-07-03: qty 1

## 2017-07-03 MED ORDER — ACETAMINOPHEN 325 MG PO TABS
650.0000 mg | ORAL_TABLET | Freq: Once | ORAL | Status: AC
  Administered 2017-07-03: 650 mg via ORAL
  Filled 2017-07-03: qty 2

## 2017-07-03 MED ORDER — ONDANSETRON 4 MG PO TBDP: 4.0000 mg | ORAL_TABLET | Freq: Three times a day (TID) | ORAL | 0 refills | Status: DC | PRN

## 2017-07-03 MED ORDER — ONDANSETRON HCL 4 MG/2ML IJ SOLN
4.0000 mg | Freq: Once | INTRAMUSCULAR | Status: AC
  Administered 2017-07-03: 4 mg via INTRAVENOUS
  Filled 2017-07-03: qty 2

## 2017-07-03 MED ORDER — SODIUM CHLORIDE 0.9 % IV BOLUS
1000.0000 mL | Freq: Once | INTRAVENOUS | Status: AC
  Administered 2017-07-03: 1000 mL via INTRAVENOUS

## 2017-07-03 NOTE — ED Notes (Signed)
Sprite and crackers placed at bedside.

## 2017-07-03 NOTE — Discharge Instructions (Signed)
Take Zofran as needed for nausea Drink plenty of fluids Follow up with Dr. Alphonzo Grieve

## 2017-07-03 NOTE — ED Triage Notes (Signed)
Pt complains of an elevated blood sugar and abdominal pain

## 2017-07-03 NOTE — ED Provider Notes (Signed)
[facility] DEPT Provider Note   CSN: [id number] Arrival date & time: 07/03/17  0309     History   Chief Complaint Chief Complaint  Patient presents with  . Hyperglycemia    HPI Tina Saunders is a 32 y.o. female who presents with hyperglycemia and abdominal pain. PMH significant for bipolar d/o, schizophrenia, obesity, HTN, HLD, insulin dependent Type 2 DM, frequent ED visits. Past surgical hx significant for cholecystectomy, hernia repair. She states that he symptoms started yesterday. She reports right sided abdominal pain, N/V/D. She's had vomiting 3 times yesterday which has improved today. She reports multiple episodes of diarrhea "every time I pee" but cannot quantify. She reports taking her insulin as prescribed. She reports subjective fever, chills. She denies chest pain, SOB, urinary symptoms, vaginal discharge. She's had multiple CT scans of her abdomen in the past couple months - last was in 5/21.  HPI  Past Medical History:  Diagnosis Date  . Anxiety   . Asthma   . Bipolar 1 disorder ([place])   . Cancer of abdominal wall   . Depression   . Diabetes mellitus without complication ([place])   . High cholesterol   . Hypertension   . Obesity   . Obesity   . Polycystic ovarian syndrome 07/01/2011   Patient report  . 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 Schizophrenia ([place])   . Seizures [facility])     Patient Active Problem List   Diagnosis Date Noted  . Hyperglycemia   . DKA (diabetic ketoacidoses) ([place]) 03/14/2017  . Chronic constipation   . Seizures ([place]) 12/11/2016  . Acute lower UTI 12/11/2016  . DM2 (diabetes mellitus, type 2) ([place]) 12/11/2016  . Uncontrolled diabetes mellitus ([place]) 11/03/2016  . Schizoaffective disorder, bipolar type ([place]) 11/02/2016  . Cluster B personality disorder ([place]) 11/02/2016  . Schizoaffective disorder, mixed type ([place]) 05/30/2014  . Suicidal ideation   . 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 Headache 04/15/2013  . HTN  (hypertension) 04/15/2013  . Post traumatic stress disorder 12/07/2011  . CAP (community acquired pneumonia) 08/27/2011  . Chest pain 08/26/2011  . SOB (shortness of breath) 08/26/2011  . Fever 08/26/2011  . Hypokalemia 08/26/2011  . PSVT (paroxysmal supraventricular tachycardia) ([place]) 08/26/2011  . ADHD 09/23/2007  . EPIGASTRIC PAIN 09/23/2007  . Obesity, unspecified 07/30/2007  . Depression 07/30/2007  . SLEEP DISORDER 07/30/2007  . IMPAIRED FASTING GLUCOSE 07/30/2007  . FATIGUE 11/21/2006  . ABNORMAL FINDINGS, ELEVATED BP W/O HTN 11/21/2006  . METRORRHAGIA 06/13/2006  . DISORDER, MENSTRUAL NEC 06/13/2006  . DIZZINESS 06/13/2006  . POLYCYSTIC OVARIAN DISEASE 04/25/2006  . AMENORRHEA, SECONDARY 04/20/2006  . ACNE, MILD 04/20/2006  . Abdominal pain 04/20/2006    Past Surgical History:  Procedure Laterality Date  . CHOLECYSTECTOMY    . COLONOSCOPY WITH PROPOFOL N/A 03/08/2017   Procedure: COLONOSCOPY WITH PROPOFOL;  Surgeon: Milus Banister, MD;  Location: [facility] ENDOSCOPY;  Service: Endoscopy;  Laterality: N/A;  . HERNIA REPAIR    . Ovarian Cyst Excision    . VARICOSE VEIN SURGERY       OB History    Gravida  0   Para      Term      Preterm      AB      Living        SAB      TAB      Ectopic      Multiple      Live Births  Home Medications    Prior to Admission medications   Medication Sig Start Date End Date Taking? Authorizing Provider  benzonatate (TESSALON) 100 MG capsule Take 1 capsule (100 mg total) by mouth 3 (three) times daily as needed for cough. Patient not taking: Reported on 04/26/2017 03/30/17   Orpah Greek, MD  busPIRone (BUSPAR) 30 MG tablet Take 15 mg by mouth 2 (two) times daily.     [provider]  cephALEXin (KEFLEX) 500 MG capsule Take 1 capsule (500 mg total) by mouth 2 (two) times daily. 06/12/17   Montine Circle, PA-C  chlorproMAZINE (THORAZINE) 50 MG tablet Take 1 tablet (50 mg total) by  mouth 3 (three) times daily. For agitation/mood control 02/09/17   Lindell Spar I, NP  cloNIDine (CATAPRES) 0.1 MG tablet Take 1 tablet (0.1 mg total) by mouth 2 (two) times daily. For high blood pressure Patient not taking: Reported on 05/26/2017 02/09/17   Lindell Spar I, NP  cyclobenzaprine (FLEXERIL) 5 MG tablet Take 1 tablet (5 mg total) by mouth 2 (two) times daily as needed for muscle spasms. Patient not taking: Reported on 04/26/2017 03/16/17   Florencia Reasons, MD  dicyclomine (BENTYL) 20 MG tablet Take 1 tablet (20 mg total) by mouth every 8 (eight) hours as needed for spasms. Patient not taking: Reported on 04/11/2017 02/09/17   Lindell Spar I, NP  dicyclomine (BENTYL) 20 MG tablet Take 1 tablet (20 mg total) by mouth 3 (three) times daily with meals as needed for spasms (abdominal pain and/or diarrhea). 05/26/17   Street, [place], PA-C  Dulaglutide (TRULICITY) 1.5 TO/6.7TI SOPN Inject 1.5 mg into the skin every Friday. For diabetes management Patient not taking: Reported on 05/26/2017 02/09/17   Lindell Spar I, NP  fenofibrate 160 MG tablet Take 1 tablet (160 mg total) by mouth daily. For high Cholesterol Patient not taking: Reported on 04/11/2017 02/10/17   Lindell Spar I, NP  gabapentin (NEURONTIN) 400 MG capsule Take 1 capsule (400 mg total) by mouth 3 (three) times daily. For agitation/diabetic neuropathy 02/09/17   Lindell Spar I, NP  glucose blood test strip Use as instructed Patient not taking: Reported on 05/26/2017 04/26/17   Montine Circle, PA-C  hydrOXYzine (ATARAX/VISTARIL) 50 MG tablet Take 1 tablet (50 mg total) by mouth 3 (three) times daily as needed for anxiety. Patient not taking: Reported on 04/26/2017 02/09/17   Lindell Spar I, NP  insulin aspart (NOVOLOG) 100 UNIT/ML injection Inject 6 Units into the skin 3 (three) times daily with meals. For diabetes management Patient taking differently: Inject 22 Units into the skin 3 (three) times daily with meals. For diabetes management 02/09/17   Lindell Spar I, NP  insulin detemir (LEVEMIR) 100 UNIT/ML injection Inject 0.68 mLs (68 Units total) into the skin 2 (two) times daily. For diabetes management 03/16/17   Florencia Reasons, MD  levETIRAcetam (KEPPRA) 500 MG tablet Take 1 tablet (500 mg total) by mouth 2 (two) times daily. For mood stabilization 02/09/17   Lindell Spar I, NP  lisinopril (PRINIVIL,ZESTRIL) 10 MG tablet Take 1 tablet (10 mg total) by mouth daily. For high blood pressure 02/09/17   Lindell Spar I, NP  magnesium oxide (MAG-OX) 400 MG tablet Take 1 tablet (400 mg total) by mouth daily. Patient not taking: Reported on 04/11/2017 03/16/17   Florencia Reasons, MD  ondansetron (ZOFRAN ODT) 4 MG disintegrating tablet Take 1 tablet (4 mg total) by mouth every 8 (eight) hours as needed for nausea or vomiting. Patient not taking:  Reported on 06/04/2017 05/26/17   Street, Dewitt Hoes, PA-C  ondansetron (ZOFRAN ODT) 4 MG disintegrating tablet Take 1 tablet (4 mg total) by mouth every 8 (eight) hours as needed for nausea or vomiting. 06/01/17   Street, [place], PA-C  pantoprazole (PROTONIX) 40 MG tablet Take 1 tablet (40 mg total) by mouth daily. For acid reflux Patient not taking: Reported on 04/26/2017 02/09/17   Lindell Spar I, NP  QUEtiapine (SEROQUEL XR) 200 MG 24 hr tablet Take 400 mg by mouth at bedtime.     [provider]  QUEtiapine (SEROQUEL) 100 MG tablet Take 5 tablets (500 mg total) by mouth at bedtime. For mood control Patient taking differently: Take 100 mg by mouth daily.  02/09/17   Lindell Spar I, NP  QUEtiapine (SEROQUEL) 50 MG tablet Take 1 tablet (50 mg total) by mouth 2 (two) times daily. For agitation/mood control Patient not taking: Reported on 04/26/2017 02/09/17   Lindell Spar I, NP  sertraline (ZOLOFT) 100 MG tablet Take 200 mg by mouth daily.    [provider]  simvastatin (ZOCOR) 10 MG tablet Take 10 mg by mouth at bedtime.    [provider]  sodium bicarbonate 650 MG tablet Take 1 tablet (650 mg total) by mouth  daily. Patient not taking: Reported on 04/11/2017 03/16/17   Florencia Reasons, MD  topiramate (TOPAMAX) 100 MG tablet Take 100 mg by mouth 2 (two) times daily.    [provider]  traZODone (DESYREL) 50 MG tablet Take 50 mg by mouth at bedtime.    [provider]    Family History Family History  Problem Relation Age of Onset  . Coronary artery disease Maternal Grandmother   . Diabetes type II Maternal Grandmother   . Cancer Maternal Grandmother   . Hypertension Mother   . Hypertension Father     Social History Social History   Tobacco Use  . Smoking status: Never Smoker  . Smokeless tobacco: Never Used  Substance Use Topics  . Alcohol use: No  . Drug use: No     Allergies   Fish-derived products; Geodon [ziprasidone hcl]; Haldol [haloperidol lactate]; Buprenorphine hcl; Compazine [prochlorperazine]; Morphine and related; and Toradol [ketorolac tromethamine]   Review of Systems Review of Systems  Constitutional: Positive for chills and fever ( subjective).  Respiratory: Negative for cough and shortness of breath.   Cardiovascular: Negative for chest pain.  Gastrointestinal: Positive for abdominal pain, diarrhea, nausea and vomiting.  Genitourinary: Negative for dysuria, flank pain, hematuria and vaginal discharge.     Physical Exam Updated Vital Signs BP (!) 152/86 (BP Location: Left Arm)   Pulse 97   Temp 98.6 F (37 C) (Oral)   Resp 18   LMP 07/02/2017   SpO2 96%   Physical Exam  Constitutional: She is oriented to person, place, and time. She appears well-developed and well-nourished. No distress.  Obese female sitting on stretcher on her laptop  HENT:  Head: Normocephalic and atraumatic.  Eyes: Pupils are equal, round, and reactive to light. Conjunctivae are normal. Right eye exhibits no discharge. Left eye exhibits no discharge. No scleral icterus.  Neck: Normal range of motion.  Cardiovascular: Normal rate and regular rhythm.  Pulmonary/Chest:  Effort normal and breath sounds normal. No respiratory distress.  Abdominal: Soft. Bowel sounds are normal. She exhibits no distension. There is tenderness (Diffuse right sided tenderness and R CVA tenderness).  Neurological: She is alert and oriented to person, place, and time.  Skin: Skin is warm and  dry.  Psychiatric: Her behavior is normal. Her affect is blunt.  Nursing note and vitals reviewed.    ED Treatments / Results  Labs (all labs ordered are listed, but only abnormal results are displayed) Labs Reviewed  BASIC METABOLIC PANEL - Abnormal; Notable for the following components:      Result Value   Sodium 133 (*)    Chloride 99 (*)    CO2 21 (*)    Glucose, Bld 444 (*)    BUN 5 (*)    All other components within normal limits  CBC - Abnormal; Notable for the following components:   Hemoglobin 11.2 (*)    HCT 35.3 (*)    MCH 25.5 (*)    Platelets 118 (*)    All other components within normal limits  URINALYSIS, ROUTINE W REFLEX MICROSCOPIC - Abnormal; Notable for the following components:   Specific Gravity, Urine 1.036 (*)    Glucose, UA >=500 (*)    Hgb urine dipstick LARGE (*)    All other components within normal limits  HEPATIC FUNCTION PANEL - Abnormal; Notable for the following components:   AST 43 (*)    Indirect Bilirubin 0.2 (*)    All other components within normal limits  CBG MONITORING, ED - Abnormal; Notable for the following components:   Glucose-Capillary 457 (*)    All other components within normal limits  CBG MONITORING, ED - Abnormal; Notable for the following components:   Glucose-Capillary 257 (*)    All other components within normal limits  LIPASE, BLOOD  I-STAT BETA HCG BLOOD, ED ([facility], [facility], [facility] ONLY)    EKG None  Radiology No results found.  Procedures Procedures (including critical care time)  Medications Ordered in ED Medications  sodium chloride 0.9 % bolus 1,000 mL (0 mLs Intravenous Stopped 07/03/17 0927)  insulin aspart  (novoLOG) injection 10 Units (10 Units Intravenous Given 07/03/17 0747)  ondansetron (ZOFRAN) injection 4 mg (4 mg Intravenous Given 07/03/17 0746)  acetaminophen (TYLENOL) tablet 650 mg (650 mg Oral Given 07/03/17 0749)     Initial Impression / Assessment and Plan / ED Course  I have reviewed the triage vital signs and the nursing notes.  Pertinent labs & imaging results that were available during my care of the patient were reviewed by me and considered in my medical decision making (see chart for details).  32 year old female presents with hyperglycemia, abdominal pain, N/V/D for one day. She is a frequent visitor to the ED. She is hypertensive but otherwise vitals are normal. On exam she is in no acute distress. She is sitting up and is on her laptop. Abdomen is diffusely tender on the right side. CBC is remarkable for mild anemia. CMP is remarkable for mild hyponatremia (133), hyperglycemia (444). UA has >500 glucose and 1.036 specific gravity. She is not pregnant. She was given fluids, insulin, zofran and Tylenol. She tolerated PO. Re-check CBG was 257. She was discharged with Zofran and PCP follow up.  Final Clinical Impressions(s) / ED Diagnoses   Final diagnoses:  Abdominal pain, unspecified abdominal location  Nausea vomiting and diarrhea  Hyperglycemia    ED Discharge Orders    None       Recardo Evangelist, PA-C 07/03/17 1610    Virgel Manifold, MD 07/03/17 1311

## 2017-07-03 NOTE — ED Notes (Signed)
Patient tolerated PO/fluid challenge.

## 2017-07-05 ENCOUNTER — Other Ambulatory Visit: Payer: Self-pay

## 2017-07-05 ENCOUNTER — Emergency Department (HOSPITAL_COMMUNITY)
Admission: EM | Admit: 2017-07-05 | Discharge: 2017-07-06 | Disposition: A | Discharge status: Home / Self Care | Payer: Medicare HMO | Attending: Emergency Medicine | Admitting: Emergency Medicine

## 2017-07-05 DIAGNOSIS — F332 Major depressive disorder, recurrent severe without psychotic features: Secondary | ICD-10-CM | POA: Diagnosis not present

## 2017-07-05 DIAGNOSIS — T433X2A Poisoning by phenothiazine antipsychotics and neuroleptics, intentional self-harm, initial encounter: Secondary | ICD-10-CM | POA: Diagnosis not present

## 2017-07-05 DIAGNOSIS — F250 Schizoaffective disorder, bipolar type: Secondary | ICD-10-CM | POA: Diagnosis not present

## 2017-07-05 DIAGNOSIS — E1165 Type 2 diabetes mellitus with hyperglycemia: Secondary | ICD-10-CM | POA: Diagnosis not present

## 2017-07-05 DIAGNOSIS — I10 Essential (primary) hypertension: Secondary | ICD-10-CM | POA: Insufficient documentation

## 2017-07-05 DIAGNOSIS — R4587 Impulsiveness: Secondary | ICD-10-CM | POA: Diagnosis not present

## 2017-07-05 DIAGNOSIS — T438X2A Poisoning by other psychotropic drugs, intentional self-harm, initial encounter: Secondary | ICD-10-CM

## 2017-07-05 DIAGNOSIS — R45851 Suicidal ideations: Secondary | ICD-10-CM | POA: Diagnosis not present

## 2017-07-05 DIAGNOSIS — J45909 Unspecified asthma, uncomplicated: Secondary | ICD-10-CM | POA: Diagnosis not present

## 2017-07-05 DIAGNOSIS — F6089 Other specific personality disorders: Secondary | ICD-10-CM

## 2017-07-05 DIAGNOSIS — F209 Schizophrenia, unspecified: Secondary | ICD-10-CM | POA: Diagnosis not present

## 2017-07-05 DIAGNOSIS — Z79899 Other long term (current) drug therapy: Secondary | ICD-10-CM | POA: Diagnosis not present

## 2017-07-05 DIAGNOSIS — Z794 Long term (current) use of insulin: Secondary | ICD-10-CM | POA: Insufficient documentation

## 2017-07-05 DIAGNOSIS — D649 Anemia, unspecified: Secondary | ICD-10-CM | POA: Diagnosis not present

## 2017-07-05 DIAGNOSIS — R42 Dizziness and giddiness: Secondary | ICD-10-CM | POA: Diagnosis not present

## 2017-07-05 DIAGNOSIS — T887XXA Unspecified adverse effect of drug or medicament, initial encounter: Secondary | ICD-10-CM | POA: Diagnosis not present

## 2017-07-05 DIAGNOSIS — E119 Type 2 diabetes mellitus without complications: Secondary | ICD-10-CM | POA: Diagnosis not present

## 2017-07-05 DIAGNOSIS — F329 Major depressive disorder, single episode, unspecified: Secondary | ICD-10-CM | POA: Diagnosis not present

## 2017-07-05 DIAGNOSIS — F29 Unspecified psychosis not due to a substance or known physiological condition: Secondary | ICD-10-CM | POA: Diagnosis not present

## 2017-07-05 DIAGNOSIS — Z915 Personal history of self-harm: Secondary | ICD-10-CM | POA: Diagnosis not present

## 2017-07-05 LAB — CBC
HEMATOCRIT: 35.4 % — AB (ref 36.0–46.0)
Hemoglobin: 11.3 g/dL — ABNORMAL LOW (ref 12.0–15.0)
MCH: 25.5 pg — ABNORMAL LOW (ref 26.0–34.0)
MCHC: 31.9 g/dL (ref 30.0–36.0)
MCV: 79.7 fL (ref 78.0–100.0)
Platelets: 115 10*3/uL — ABNORMAL LOW (ref 150–400)
RBC: 4.44 MIL/uL (ref 3.87–5.11)
RDW: 15.4 % (ref 11.5–15.5)
WBC: 3.5 10*3/uL — AB (ref 4.0–10.5)

## 2017-07-05 LAB — COMPREHENSIVE METABOLIC PANEL
ALK PHOS: 106 U/L (ref 38–126)
ALT: 31 U/L (ref 14–54)
ANION GAP: 12 (ref 5–15)
AST: 45 U/L — ABNORMAL HIGH (ref 15–41)
Albumin: 4 g/dL (ref 3.5–5.0)
BILIRUBIN TOTAL: 0.4 mg/dL (ref 0.3–1.2)
BUN: 6 mg/dL (ref 6–20)
CALCIUM: 8.6 mg/dL — AB (ref 8.9–10.3)
CO2: 22 mmol/L (ref 22–32)
Chloride: 103 mmol/L (ref 101–111)
Creatinine, Ser: 0.51 mg/dL (ref 0.44–1.00)
GFR calc Af Amer: >60 mL/min (ref >60)
GLUCOSE: 427 mg/dL — AB (ref 65–99)
POTASSIUM: 3.7 mmol/L (ref 3.5–5.1)
Sodium: 137 mmol/L (ref 135–145)
TOTAL PROTEIN: 7.4 g/dL (ref 6.5–8.1)

## 2017-07-05 LAB — RAPID URINE DRUG SCREEN, HOSP PERFORMED
Amphetamines: NOT DETECTED
BARBITURATES: NOT DETECTED
BENZODIAZEPINES: NOT DETECTED
COCAINE: NOT DETECTED
Opiates: NOT DETECTED
Tetrahydrocannabinol: NOT DETECTED

## 2017-07-05 LAB — ETHANOL

## 2017-07-05 LAB — I-STAT BETA HCG BLOOD, ED (MC, WL, AP ONLY): I-stat hCG, quantitative: <5 m[IU]/mL (ref <5)

## 2017-07-05 LAB — MAGNESIUM: Magnesium: 1.6 mg/dL — ABNORMAL LOW (ref 1.7–2.4)

## 2017-07-05 LAB — CBG MONITORING, ED
GLUCOSE-CAPILLARY: 365 mg/dL — AB (ref 65–99)
Glucose-Capillary: 280 mg/dL — ABNORMAL HIGH (ref 65–99)
Glucose-Capillary: 319 mg/dL — ABNORMAL HIGH (ref 65–99)
Glucose-Capillary: 316 mg/dL — ABNORMAL HIGH (ref 65–99)

## 2017-07-05 LAB — ACETAMINOPHEN LEVEL

## 2017-07-05 LAB — SALICYLATE LEVEL: Salicylate Lvl: <7 mg/dL (ref 2.8–30.0)

## 2017-07-05 MED ORDER — TRAZODONE HCL 50 MG PO TABS
50.0000 mg | ORAL_TABLET | Freq: Every day | ORAL | Status: DC
  Administered 2017-07-05: 50 mg via ORAL
  Filled 2017-07-05: qty 1

## 2017-07-05 MED ORDER — TOPIRAMATE 100 MG PO TABS
100.0000 mg | ORAL_TABLET | Freq: Two times a day (BID) | ORAL | Status: DC
  Administered 2017-07-05 – 2017-07-06 (×3): 100 mg via ORAL
  Filled 2017-07-05 (×3): qty 1

## 2017-07-05 MED ORDER — INSULIN ASPART 100 UNIT/ML ~~LOC~~ SOLN
0.0000 [IU] | Freq: Every day | SUBCUTANEOUS | Status: DC
  Administered 2017-07-05: 5 [IU] via SUBCUTANEOUS
  Filled 2017-07-05: qty 1

## 2017-07-05 MED ORDER — BUSPIRONE HCL 10 MG PO TABS
15.0000 mg | ORAL_TABLET | Freq: Two times a day (BID) | ORAL | Status: DC
Start: 2017-07-05 — End: 2017-07-06
  Administered 2017-07-05 – 2017-07-06 (×3): 15 mg via ORAL
  Filled 2017-07-05 (×3): qty 2

## 2017-07-05 MED ORDER — QUETIAPINE FUMARATE ER 200 MG PO TB24
400.0000 mg | ORAL_TABLET | Freq: Every day | ORAL | Status: DC
  Administered 2017-07-05: 400 mg via ORAL
  Filled 2017-07-05: qty 2

## 2017-07-05 MED ORDER — ALUM & MAG HYDROXIDE-SIMETH 200-200-20 MG/5ML PO SUSP: 30.0000 mL | Freq: Four times a day (QID) | ORAL | Status: DC | PRN

## 2017-07-05 MED ORDER — ACETAMINOPHEN 325 MG PO TABS: 650.0000 mg | ORAL_TABLET | ORAL | Status: DC | PRN

## 2017-07-05 MED ORDER — MAGNESIUM SULFATE 2 GM/50ML IV SOLN
2.0000 g | Freq: Once | INTRAVENOUS | Status: AC
Start: 2017-07-05 — End: 2017-07-05
  Administered 2017-07-05: 2 g via INTRAVENOUS

## 2017-07-05 MED ORDER — SERTRALINE HCL 50 MG PO TABS
200.0000 mg | ORAL_TABLET | Freq: Every day | ORAL | Status: DC
  Administered 2017-07-05 – 2017-07-06 (×2): 200 mg via ORAL
  Filled 2017-07-05 (×2): qty 4

## 2017-07-05 MED ORDER — POTASSIUM CHLORIDE CRYS ER 20 MEQ PO TBCR
EXTENDED_RELEASE_TABLET | ORAL | Status: AC
  Filled 2017-07-05: qty 2

## 2017-07-05 MED ORDER — GABAPENTIN 400 MG PO CAPS
400.0000 mg | ORAL_CAPSULE | Freq: Three times a day (TID) | ORAL | Status: DC
  Administered 2017-07-05 – 2017-07-06 (×4): 400 mg via ORAL
  Filled 2017-07-05 (×4): qty 1

## 2017-07-05 MED ORDER — POTASSIUM CHLORIDE CRYS ER 20 MEQ PO TBCR
40.0000 meq | EXTENDED_RELEASE_TABLET | Freq: Once | ORAL | Status: AC
  Administered 2017-07-05: 40 meq via ORAL

## 2017-07-05 MED ORDER — LEVETIRACETAM 500 MG PO TABS
500.0000 mg | ORAL_TABLET | Freq: Two times a day (BID) | ORAL | Status: DC
  Administered 2017-07-05 – 2017-07-06 (×3): 500 mg via ORAL
  Filled 2017-07-05 (×3): qty 1

## 2017-07-05 MED ORDER — POTASSIUM CHLORIDE 10 MEQ/100ML IV SOLN
INTRAVENOUS | Status: AC
  Administered 2017-07-05: 10 meq via INTRAVENOUS
  Filled 2017-07-05: qty 100

## 2017-07-05 MED ORDER — ONDANSETRON HCL 4 MG PO TABS: 4.0000 mg | ORAL_TABLET | Freq: Three times a day (TID) | ORAL | Status: DC | PRN

## 2017-07-05 MED ORDER — INSULIN DETEMIR 100 UNIT/ML ~~LOC~~ SOLN
30.0000 [IU] | Freq: Every day | SUBCUTANEOUS | Status: DC
  Administered 2017-07-05 – 2017-07-06 (×2): 30 [IU] via SUBCUTANEOUS
  Filled 2017-07-05 (×2): qty 0.3

## 2017-07-05 MED ORDER — INSULIN DETEMIR 100 UNIT/ML ~~LOC~~ SOLN
68.0000 [IU] | Freq: Two times a day (BID) | SUBCUTANEOUS | Status: DC
Start: 2017-07-05 — End: 2017-07-05

## 2017-07-05 MED ORDER — LISINOPRIL 10 MG PO TABS
10.0000 mg | ORAL_TABLET | Freq: Every day | ORAL | Status: DC
  Administered 2017-07-05 – 2017-07-06 (×2): 10 mg via ORAL
  Filled 2017-07-05 (×2): qty 1

## 2017-07-05 MED ORDER — INSULIN ASPART 100 UNIT/ML ~~LOC~~ SOLN
0.0000 [IU] | Freq: Three times a day (TID) | SUBCUTANEOUS | Status: DC
Start: 2017-07-05 — End: 2017-07-05

## 2017-07-05 MED ORDER — INSULIN ASPART 100 UNIT/ML ~~LOC~~ SOLN: 22.0000 [IU] | Freq: Three times a day (TID) | SUBCUTANEOUS | Status: DC

## 2017-07-05 MED ORDER — SIMVASTATIN 10 MG PO TABS
10.0000 mg | ORAL_TABLET | Freq: Every day | ORAL | Status: DC
  Administered 2017-07-05: 10 mg via ORAL
  Filled 2017-07-05: qty 1

## 2017-07-05 MED ORDER — POTASSIUM CHLORIDE 10 MEQ/100ML IV SOLN
10.0000 meq | Freq: Once | INTRAVENOUS | Status: AC
Start: 2017-07-05 — End: 2017-07-05
  Administered 2017-07-05: 10 meq via INTRAVENOUS

## 2017-07-05 MED ORDER — INSULIN ASPART 100 UNIT/ML ~~LOC~~ SOLN
0.0000 [IU] | Freq: Three times a day (TID) | SUBCUTANEOUS | Status: DC
  Administered 2017-07-05: 15 [IU] via SUBCUTANEOUS
  Administered 2017-07-05: 11 [IU] via SUBCUTANEOUS
  Administered 2017-07-05: 15 [IU] via SUBCUTANEOUS
  Administered 2017-07-06: 11 [IU] via SUBCUTANEOUS
  Administered 2017-07-06: 4 [IU] via SUBCUTANEOUS
  Filled 2017-07-05 (×5): qty 1

## 2017-07-05 MED ORDER — MAGNESIUM SULFATE 2 GM/50ML IV SOLN
INTRAVENOUS | Status: AC
  Filled 2017-07-05: qty 50

## 2017-07-05 MED ORDER — QUETIAPINE FUMARATE 100 MG PO TABS
100.0000 mg | ORAL_TABLET | Freq: Every day | ORAL | Status: DC
  Administered 2017-07-05 – 2017-07-06 (×2): 100 mg via ORAL
  Filled 2017-07-05 (×2): qty 1

## 2017-07-05 NOTE — ED Notes (Signed)
Patient is resting comfortably. 

## 2017-07-05 NOTE — ED Provider Notes (Signed)
[facility] DEPT Provider Note   CSN: [id number] Arrival date & time: 07/05/17  0107     History   Chief Complaint Overdose  HPI Tina Saunders is a 32 y.o. female.  The history is provided by the EMS personnel. The history is limited by the condition of the patient (Uncooperative, will not answer questions).  She has history of hypertension, hyperlipidemia, diabetes, rhabdosarcoma, schizophrenia and comes in after a suicide attempt.  She is reported to have taken 33 tablets of chlorpromazine 50 mg at about midnight.  She will not answer any questions, and is completely nonverbal for me while in the room.  She did tell the RN that she was having some dizziness but denied any pain.  Past Medical History:  Diagnosis Date  . Anxiety   . Asthma   . Bipolar 1 disorder ([place])   . Cancer of abdominal wall   . Depression   . Diabetes mellitus without complication ([place])   . High cholesterol   . Hypertension   . Obesity   . Obesity   . Polycystic ovarian syndrome 07/01/2011   Patient report  . 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 Schizophrenia ([place])   . Seizures [facility])     Patient Active Problem List   Diagnosis Date Noted  . DKA (diabetic ketoacidoses) ([place]) 03/14/2017  . Chronic constipation   . Seizures ([place]) 12/11/2016  . Acute lower UTI 12/11/2016  . DM2 (diabetes mellitus, type 2) ([place]) 12/11/2016  . Uncontrolled diabetes mellitus ([place]) 11/03/2016  . Schizoaffective disorder, bipolar type ([place]) 11/02/2016  . Cluster B personality disorder ([place]) 11/02/2016  . Suicidal ideation   . 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 HTN (hypertension) 04/15/2013  . Post traumatic stress disorder 12/07/2011  . PSVT (paroxysmal supraventricular tachycardia) ([place]) 08/26/2011  . ADHD 09/23/2007  . EPIGASTRIC PAIN 09/23/2007  . Obesity, unspecified 07/30/2007  . Depression 07/30/2007  . SLEEP DISORDER 07/30/2007  . IMPAIRED FASTING GLUCOSE 07/30/2007  . ABNORMAL  FINDINGS, ELEVATED BP W/O HTN 11/21/2006  . METRORRHAGIA 06/13/2006  . DISORDER, MENSTRUAL NEC 06/13/2006  . POLYCYSTIC OVARIAN DISEASE 04/25/2006  . AMENORRHEA, SECONDARY 04/20/2006  . ACNE, MILD 04/20/2006  . Abdominal pain 04/20/2006    Past Surgical History:  Procedure Laterality Date  . CHOLECYSTECTOMY    . COLONOSCOPY WITH PROPOFOL N/A 03/08/2017   Procedure: COLONOSCOPY WITH PROPOFOL;  Surgeon: Milus Banister, MD;  Location: [facility] ENDOSCOPY;  Service: Endoscopy;  Laterality: N/A;  . HERNIA REPAIR    . Ovarian Cyst Excision    . VARICOSE VEIN SURGERY       OB History    Gravida  0   Para      Term      Preterm      AB      Living        SAB      TAB      Ectopic      Multiple      Live Births               Home Medications    Prior to Admission medications   Medication Sig Start Date End Date Taking? Authorizing Provider  busPIRone (BUSPAR) 30 MG tablet Take 15 mg by mouth 2 (two) times daily.     [provider]  cephALEXin (KEFLEX) 500 MG capsule Take 1 capsule (500 mg total) by mouth 2 (two) times daily. 06/12/17   Montine Circle, PA-C  chlorproMAZINE (THORAZINE)  50 MG tablet Take 1 tablet (50 mg total) by mouth 3 (three) times daily. For agitation/mood control 02/09/17   Lindell Spar I, NP  dicyclomine (BENTYL) 20 MG tablet Take 1 tablet (20 mg total) by mouth 3 (three) times daily with meals as needed for spasms (abdominal pain and/or diarrhea). 05/26/17   Street, [place], PA-C  gabapentin (NEURONTIN) 400 MG capsule Take 1 capsule (400 mg total) by mouth 3 (three) times daily. For agitation/diabetic neuropathy 02/09/17   Lindell Spar I, NP  insulin aspart (NOVOLOG) 100 UNIT/ML injection Inject 6 Units into the skin 3 (three) times daily with meals. For diabetes management Patient taking differently: Inject 22 Units into the skin 3 (three) times daily with meals. For diabetes management 02/09/17   Lindell Spar I, NP  insulin detemir (LEVEMIR)  100 UNIT/ML injection Inject 0.68 mLs (68 Units total) into the skin 2 (two) times daily. For diabetes management 03/16/17   Florencia Reasons, MD  levETIRAcetam (KEPPRA) 500 MG tablet Take 1 tablet (500 mg total) by mouth 2 (two) times daily. For mood stabilization 02/09/17   Lindell Spar I, NP  lisinopril (PRINIVIL,ZESTRIL) 10 MG tablet Take 1 tablet (10 mg total) by mouth daily. For high blood pressure 02/09/17   [place], Herbert Pun I, NP  ondansetron (ZOFRAN ODT) 4 MG disintegrating tablet Take 1 tablet (4 mg total) by mouth every 8 (eight) hours as needed for nausea or vomiting. 06/01/17   Street, [place], PA-C  ondansetron (ZOFRAN ODT) 4 MG disintegrating tablet Take 1 tablet (4 mg total) by mouth every 8 (eight) hours as needed for nausea or vomiting. 07/03/17   Recardo Evangelist, PA-C  QUEtiapine (SEROQUEL XR) 200 MG 24 hr tablet Take 400 mg by mouth at bedtime.     [provider]  QUEtiapine (SEROQUEL) 100 MG tablet Take 5 tablets (500 mg total) by mouth at bedtime. For mood control Patient taking differently: Take 100 mg by mouth daily.  02/09/17   Lindell Spar I, NP  sertraline (ZOLOFT) 100 MG tablet Take 200 mg by mouth daily.    [provider]  simvastatin (ZOCOR) 10 MG tablet Take 10 mg by mouth at bedtime.    [provider]  topiramate (TOPAMAX) 100 MG tablet Take 100 mg by mouth 2 (two) times daily.    [provider]  traZODone (DESYREL) 50 MG tablet Take 50 mg by mouth at bedtime.    [provider]    Family History Family History  Problem Relation Age of Onset  . Coronary artery disease Maternal Grandmother   . Diabetes type II Maternal Grandmother   . Cancer Maternal Grandmother   . Hypertension Mother   . Hypertension Father     Social History Social History   Tobacco Use  . Smoking status: Never Smoker  . Smokeless tobacco: Never Used  Substance Use Topics  . Alcohol use: No  . Drug use: No     Allergies   Fish-derived products;  Geodon [ziprasidone hcl]; Haldol [haloperidol lactate]; Buprenorphine hcl; Compazine [prochlorperazine]; Morphine and related; and Toradol [ketorolac tromethamine]   Review of Systems Review of Systems  Unable to perform ROS: Patient nonverbal     Physical Exam Updated Vital Signs BP (!) 146/88   Pulse 94   Temp 98.2 F (36.8 C) (Oral)   Resp 18   LMP 07/02/2017   SpO2 96%   Physical Exam  Nursing note and vitals reviewed.  32 year old female, resting comfortably and in no acute  distress. Vital signs are significant for mildly elevated systolic blood pressure. Oxygen saturation is 96%, which is normal. Head is normocephalic and atraumatic. PERRLA, EOMI. Oropharynx is clear. Neck is nontender and supple without adenopathy or JVD. Back is nontender and there is no CVA tenderness. Lungs are clear without rales, wheezes, or rhonchi. Chest is nontender. Heart has regular rate and rhythm without murmur. Abdomen is soft, flat, nontender without masses or hepatosplenomegaly and peristalsis is normoactive. Extremities have no cyanosis or edema, full range of motion is present. Skin is warm and dry without rash. Neurologic: Awake but nonverbal, will follow some simple commands, cranial nerves are intact, there are no motor or sensory deficits.  ED Treatments / Results  Labs (all labs ordered are listed, but only abnormal results are displayed) Labs Reviewed  COMPREHENSIVE METABOLIC PANEL - Abnormal; Notable for the following components:      Result Value   Glucose, Bld 427 (*)    Calcium 8.6 (*)    AST 45 (*)    All other components within normal limits  CBC - Abnormal; Notable for the following components:   WBC 3.5 (*)    Hemoglobin 11.3 (*)    HCT 35.4 (*)    MCH 25.5 (*)    All other components within normal limits  CBG MONITORING, ED - Abnormal; Notable for the following components:   Glucose-Capillary 365 (*)    All other components within normal limits  ETHANOL    SALICYLATE LEVEL  ACETAMINOPHEN LEVEL  RAPID URINE DRUG SCREEN, HOSP PERFORMED  I-STAT BETA HCG BLOOD, ED ([facility], [facility], [facility] ONLY)    EKG EKG Interpretation  Date/Time:  Thursday July 05 2017 01:18:50 EDT Ventricular Rate:  84 PR Interval:    QRS Duration: 100 QT Interval:  402 QTC Calculation: 476 R Axis:   -9 Text Interpretation:  Sinus rhythm Inferior infarct, old When compared with ECG of 04/25/2017, No significant change was found Confirmed by Delora Fuel ([id number]) on 07/05/2017 1:23:29 AM   Radiology No results found.  Procedures Procedures  CRITICAL CARE Performed by: Delora Fuel Total critical care time: 60 minutes Critical care time was exclusive of separately billable procedures and treating other patients. Critical care was necessary to treat or prevent imminent or life-threatening deterioration. Critical care was time spent personally by me on the following activities: development of treatment plan with patient and/or surrogate as well as nursing, discussions with consultants, evaluation of patient's response to treatment, examination of patient, obtaining history from patient or surrogate, ordering and performing treatments and interventions, ordering and review of laboratory studies, ordering and review of radiographic studies, pulse oximetry and re-evaluation of patient's condition.  Medications Ordered in ED Medications  alum & mag hydroxide-simeth (MAALOX/MYLANTA) 200-200-20 MG/5ML suspension 30 mL (has no administration in time range)  acetaminophen (TYLENOL) tablet 650 mg (has no administration in time range)  ondansetron (ZOFRAN) tablet 4 mg (has no administration in time range)  busPIRone (BUSPAR) tablet 15 mg (has no administration in time range)  gabapentin (NEURONTIN) capsule 400 mg (has no administration in time range)  insulin aspart (novoLOG) injection 22 Units (has no administration in time range)  insulin detemir (LEVEMIR) injection 68 Units (has no  administration in time range)  levETIRAcetam (KEPPRA) tablet 500 mg (has no administration in time range)  lisinopril (PRINIVIL,ZESTRIL) tablet 10 mg (has no administration in time range)  QUEtiapine (SEROQUEL XR) 24 hr tablet 400 mg (has no administration in time range)  QUEtiapine (SEROQUEL) tablet 100 mg (has  no administration in time range)  sertraline (ZOLOFT) tablet 200 mg (has no administration in time range)  simvastatin (ZOCOR) tablet 10 mg (has no administration in time range)  topiramate (TOPAMAX) tablet 100 mg (has no administration in time range)  traZODone (DESYREL) tablet 50 mg (has no administration in time range)  insulin aspart (novoLOG) injection 0-15 Units (has no administration in time range)  potassium chloride 10 mEq in 100 mL IVPB (0 mEq Intravenous Stopped 07/05/17 0549)  potassium chloride SA (K-DUR,KLOR-CON) CR tablet 40 mEq (40 mEq Oral Given 07/05/17 0345)  magnesium sulfate IVPB 2 g 50 mL (0 g Intravenous Stopped 07/05/17 0443)     Initial Impression / Assessment and Plan / ED Course  I have reviewed the triage vital signs and the nursing notes.  Pertinent labs & imaging results that were available during my care of the patient were reviewed by me and considered in my medical decision making (see chart for details).  Suicide attempt with overdose of chlorpromazine.  Poison control is been contacted and recommends keeping potassium above 4, magnesium above 2, maintaining seizure precautions, benzodiazepines as needed.  Observation for 6 hours, then medically cleared if stable.  Old records are reviewed, and she has numerous ED visits related to hyperglycemia, but also ED visit secondary to psychiatric illnesses including suicidal ideation.  Labs show potassium 3.7, which is below goal.  She is given intravenous and oral potassium.  Mild anemia present which is unchanged from baseline.  Glucose is over 400, but with normal anion gap.  3:01 AM Continues to be awake  and alert.  She has cold and aid that she was going to go home and do it right this time.  Clearly, she is still suicidal and will need TTS evaluation.  IVC paperwork filed.  3:28 AM Magnesium has come back low at 1.6, she is given intravenous magnesium.  7:00 AM Patient has been observed for 6 hours in the ED, and has been hemodynamically stable.  No evidence of seizure activity.  She is now felt to be medically cleared for psychiatric treatment and is transferred to the psychiatric holding area.  Final Clinical Impressions(s) / ED Diagnoses   Final diagnoses:  Suicide attempt by other psychotropic drug overdose, initial encounter [facility])  Hypomagnesemia  Normocytic anemia    ED Discharge Orders    None       Delora Fuel, MD 50/09/38 0700

## 2017-07-05 NOTE — BHH Counselor (Signed)
TTS writer attempted to see patient. Informed by patient's nurse she's getting ready to be moved to the [facility]. TTS will see patient once she's in her new room in the [facility].

## 2017-07-05 NOTE — ED Notes (Signed)
Per Dr. Francia Greaves patient will be observed overnight because IVC papers was not rescinded and he felt uncomfortable discharging patient at this time. Plan of care discussed. Encouragement and support provided and safety maintain. Q 15 min safety checks remain in place and video monitoring.

## 2017-07-05 NOTE — BHH Counselor (Signed)
Clinician spoke to Dr. Francia Greaves and expressed the pt is under IVC'd, and in order for the pt to be discharged the pt's IVC has to be rescinded. Dr. Francia Greaves reported, the pt can be observed overnight for safety and stabilization. Clinician discussed the pt's updated dispostion with [place], RN.    Vertell Novak, MS, [facility], [facility] Triage Specialist [phone]

## 2017-07-05 NOTE — Progress Notes (Signed)
Inpatient Diabetes Program Recommendations  AACE/ADA: New Consensus Statement on Inpatient Glycemic Control (2015)  Target Ranges:  Prepandial:   less than 140 mg/dL      Peak postprandial:   less than 180 mg/dL (1-2 hours)      Critically ill patients:  140 - 180 mg/dL  Results for Tina Saunders, Tina Saunders (MRN [id number]) as of 07/05/2017 15:58  Ref. Range 07/05/2017 01:28 07/05/2017 08:27 07/05/2017 13:25  Glucose-Capillary Latest Ref Range: 65 - 99 mg/dL 365 (H) 280 (H) 319 (H)   Review of Glycemic Control  Diabetes history: DM 2 Outpatient Diabetes medication: Levemir 68 units BID, Novolog 22 units tid with meals Current orders for Inpatient glycemic control: Levemir 30 units Daily, Novolog 0-20 units tid + Novolog HS scale  Inpatient Diabetes Program Recommendations:    Glucose trends in the 200-300 range. Consider Levemir 40 units BID, Consider Novolog meal coverage 5 units tid in addition to correction scale if patient consumes at least 50% of meal and glucose is at least 80 mg/dl.  Thanks,  Tama Headings RN, MSN, BC-ADM, [facility] Inpatient Diabetes Coordinator Team Pager [phone] (8a-5p)

## 2017-07-05 NOTE — ED Notes (Addendum)
Patient is resting comfortably. 

## 2017-07-05 NOTE — BHH Counselor (Addendum)
Dr. Francia Greaves reviewed pt's chart and noted he felt the pt could be discharged. He asked about the pt's medications. Clinician is unsure of pt's medicaition regimen. Will follow up with Dr. Francia Greaves.    Vertell Novak, MS, [facility], [facility] Triage Specialist [phone]

## 2017-07-05 NOTE — ED Notes (Signed)
Pt observed outside of [facility] room door pulling on chest leads and PulseOx lead. Entered room and asked if assistance needed. Per pt "I'm want to go home and try it again.Marland Kitchenit's a long story.Marland KitchenMarland KitchenI'm thirsty and I'm hungry". Listened empathetically to pt and offered words of comfort/encouragement; explained no food can be given now but will inquire if can have ice chips; offered measures for bed comfort. Per RN ice chips given to pt. RN and MD advised of conversation/actions. [employer]

## 2017-07-05 NOTE — ED Triage Notes (Signed)
Patient BIB EMS for suicide attempt on chlorpromazine, patient called EMS herself. Pt took about 33 50mg  tablets at approximately midnight. Pt hallucinates about a man named Copywriter, advertising. Patient does not report pain, just dizziness.  Vitals BP 151/94 P 90 Sp02 97% RA RR 16 CBG 439- pt states she did not take her insulin

## 2017-07-05 NOTE — ED Notes (Signed)
Downtime: Museum/gallery exhibitions officer and spoke with Gina: -observe for drowsiness -anticholinergic effects -tachycardia -dilated pupils -Prolonged qTc 490->500 -Seizure precautions -Optimize potassium and magnesium levels-maintain high normal -0400 acetaminophen level -fluids and benzo's as needed for seizures -Observe patient for 6 hours

## 2017-07-05 NOTE — Discharge Instructions (Addendum)
Please return for any problem. Follow up with your regular care provider in 2-3 days as instructed.   For your mental health needs, you are advised to follow up with [facility].  New and returning patients are seen at their walk-in clinic.  Walk-in hours are Monday - Friday from 8:00 am - 3:00 pm.  Walk-in patients are seen on a first come, first served basis.  Try to arrive as early as possible for he best chance of being seen the same day.  You are advised to ask them about resuming ACT Team services:       [facility]      [address]      [place], [place] [address]      [phone]

## 2017-07-05 NOTE — ED Notes (Signed)
Patient Belongings: Flip flops, white shorts, multi-colored shirt, small black zipper bag,  [place] ID, Medicare Card, EBT Card [phone], $20 Bill (1) cash, Misc. Amount of change (no more than $3), misc. Receipts,  2 sets of keyes  Belongings were secured in locker #41. Belongings were itemized on belongings sheet located in patient chart at nurses station.

## 2017-07-05 NOTE — BH Assessment (Signed)
Tele Assessment Note   Patient Name: Tina Saunders MRN: [id number] Referring Physician: Delora Fuel, MD Location of Patient: [facility] Location of Provider: Behavioral Health TTS Department  Tina Saunders is an 32 y.o. female patient present to [facility] via EMS after a self report attempted overdose. Patient report she called 911 after taking 33 tablets of chlorpromazine 50 mg at about midnight. Patient report this was an impulsive act. Report suicide intent triggered by a confrontation with her sister-in-law who threaten not to allow patient to see her brother or niece/nephew again. Report she received a check from her payee for $600.00. Report the check bounced on her sister-in-law bank. Report she was informed by her sister-in-law the matter must be taken care of by Friday or she would not be allowed to see her brother or his family again. Report she became upset and attempted to end her own life.   Patient currently denies suicidal / homicidal ideations. Denies visual / hallucinatory hallucinations. Denies feelings of depression.   Patient present with a flat affect affect. Patient spoke clearly and was alert. Patient appeared to become frustrated with TTS writer as evidenced by patient raising her voice. Patient report she called EMS after self -reported taking the pills because she was trying    Diagnosis: F33.2   Major depressive disorder, Recurrent episode, Severe F20.9  Schizophrenia   Past Medical History:  Past Medical History:  Diagnosis Date  . Anxiety   . Asthma   . Bipolar 1 disorder ([place])   . Cancer of abdominal wall   . Depression   . Diabetes mellitus without complication ([place])   . High cholesterol   . Hypertension   . Obesity   . Obesity   . Polycystic ovarian syndrome 07/01/2011   Patient report  . 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 Schizophrenia ([place])   . Seizures ([place])     Past Surgical History:  Procedure Laterality Date  . CHOLECYSTECTOMY    . COLONOSCOPY WITH  PROPOFOL N/A 03/08/2017   Procedure: COLONOSCOPY WITH PROPOFOL;  Surgeon: Milus Banister, MD;  Location: [facility];  Service: Endoscopy;  Laterality: N/A;  . HERNIA REPAIR    . Ovarian Cyst Excision    . VARICOSE VEIN SURGERY      Family History:  Family History  Problem Relation Age of Onset  . Coronary artery disease Maternal Grandmother   . Diabetes type II Maternal Grandmother   . Cancer Maternal Grandmother   . Hypertension Mother   . Hypertension Father     Social History:  reports that she has never smoked. She has never used smokeless tobacco. She reports that she does not drink alcohol or use drugs.  Additional Social History:  Alcohol / Drug Use Pain Medications: See MAR Prescriptions: See MAR Over the Counter: See MAR History of alcohol / drug use?: No history of alcohol / drug abuse Longest period of sobriety (when/how long): Unknown  CIWA: CIWA-Ar BP: 120/74 Pulse Rate: 94 COWS:    Allergies:  Allergies  Allergen Reactions  . Fish-Derived Products Anaphylaxis    Can only eat FLounder  . Geodon [Ziprasidone Hcl] Other (See Comments)    Face pulls, cant swallow - Locked Jaw  . Haldol [Haloperidol Lactate] Other (See Comments)    Face pulls, can't swallow - Locked Jaw  . Buprenorphine Hcl Hives, Itching, Rash and Other (See Comments)    GI upset  . Compazine [Prochlorperazine] Other (See Comments)    anxiety and hyperactivity  . Morphine  And Related Hives, Itching, Rash and Other (See Comments)    GI upset  . Toradol [Ketorolac Tromethamine] Other (See Comments)    Anxiety and hyperactivity    Home Medications:  (Not in a hospital admission)  OB/GYN Status:  Patient's last menstrual period was 07/02/2017.  General Assessment Data Location of Assessment: [facility] TTS Assessment: In system Is this a Tele or Face-to-Face Assessment?: Face-to-Face Is this an Initial Assessment or a Re-assessment for this encounter?: Initial Assessment Marital status:  Single Living Arrangements: Parent Can pt return to current living arrangement?: Yes Admission Status: Voluntary Is patient capable of signing voluntary admission?: Yes Referral Source: Self/Family/Friend Insurance type: [facility] Medicare     Crisis Care Plan Living Arrangements: Parent Legal Guardian: Other:(self) Name of Psychiatrist: Tobie Lords MD Name of Therapist: None  Education Status Is patient currently in school?: No Is the patient employed, unemployed or receiving disability?: Receiving disability income  Risk to self with the past 6 months Suicidal Ideation: No-Not Currently/Within Last 6 Months(pt denies currently being SI to TT ) Has patient been a risk to self within the past 6 months prior to admission? : Yes Suicidal Intent: Yes-Currently Present(pt self-report taking 33 tablets of Chlorpromazine 50mg ) Has patient had any suicidal intent within the past 6 months prior to admission? : Yes Is patient at risk for suicide?: No Suicidal Plan?: No-Not Currently/Within Last 6 Months(pt self report she attempted to overdose) Has patient had any suicidal plan within the past 6 months prior to admission? : Yes Access to Means: Yes Specify Access to Suicidal Means: pt report she has access to meds What has been your use of drugs/alcohol within the last 12 months?: pt denies Previous Attempts/Gestures: Yes How many times?: (unknown, pt refused to answer) Other Self Harm Risks: none known Triggers for Past Attempts: Unknown Intentional Self Injurious Behavior: None Family Suicide History: No Recent stressful life event(s): Other (Comment)(disagreement with brother's wife) Persecutory voices/beliefs?: No Depression: Yes Depression Symptoms: Feeling worthless/self pity Substance abuse history and/or treatment for substance abuse?: No Suicide prevention information given to non-admitted patients: Not applicable  Risk to Others within the past 6 months Homicidal Ideation:  No Does patient have any lifetime risk of violence toward others beyond the six months prior to admission? : No Thoughts of Harm to Others: No Current Homicidal Intent: No Current Homicidal Plan: No Access to Homicidal Means: No Identified Victim: n/a History of harm to others?: No Assessment of Violence: None Noted Violent Behavior Description: none noted Does patient have access to weapons?: No Criminal Charges Pending?: No Does patient have a court date: No Is patient on probation?: No  Psychosis Hallucinations: None noted Delusions: None noted  Mental Status Report Appearance/Hygiene: Unremarkable, In scrubs Eye Contact: Fair Motor Activity: Freedom of movement Speech: Logical/coherent Level of Consciousness: Alert Mood: Worthless, low self-esteem Affect: Sad Anxiety Level: None Thought Processes: Coherent, Relevant Judgement: Unimpaired Orientation: Person, Place, Time Obsessive Compulsive Thoughts/Behaviors: None  Cognitive Functioning Concentration: Fair Memory: Recent Intact, Remote Intact Is patient IDD: No Is patient DD?: No Insight: Fair Impulse Control: Fair Appetite: Good Have you had any weight changes? : No Change Sleep: No Change Vegetative Symptoms: None  ADLScreening [facility] Assessment Services) Patient's cognitive ability adequate to safely complete daily activities?: Yes Patient able to express need for assistance with ADLs?: Yes Independently performs ADLs?: Yes (appropriate for developmental age)  Prior Inpatient Therapy Prior Inpatient Therapy: Yes Prior Therapy Dates: 2019 Prior Therapy Facilty/Provider(s): [facility] Reason for Treatment: MH issues  Prior Outpatient Therapy Prior Outpatient Therapy: Yes Prior Therapy Dates: 2019 Prior Therapy Facilty/Provider(s): [facility] Reason for Treatment: Med mang Does patient have an ACCT team?: Yes Does patient have Intensive In-House Services?  : No Does patient have [facility] services? :  No Does patient have P4CC services?: No  ADL Screening (condition at time of admission) Patient's cognitive ability adequate to safely complete daily activities?: Yes Is the patient deaf or have difficulty hearing?: No Does the patient have difficulty seeing, even when wearing glasses/contacts?: No Does the patient have difficulty concentrating, remembering, or making decisions?: No Patient able to express need for assistance with ADLs?: Yes Does the patient have difficulty dressing or bathing?: No Independently performs ADLs?: Yes (appropriate for developmental age) Does the patient have difficulty walking or climbing stairs?: No       Abuse/Neglect Assessment (Assessment to be complete while patient is alone) Abuse/Neglect Assessment Can Be Completed: Yes Physical Abuse: Denies Verbal Abuse: Denies Sexual Abuse: Denies Exploitation of patient/patient's resources: Denies Self-Neglect: Denies     Regulatory affairs officer (For Healthcare) Does Patient Have a Medical Advance Directive?: No Would patient like information on creating a medical advance directive?: No - Patient declined          Disposition:  Disposition Initial Assessment Completed for this Encounter: (Dr. Mariea Clonts & L. Parks recommended D/C)  Avaiah Stempel 07/05/2017 9:40 AM

## 2017-07-05 NOTE — ED Notes (Signed)
Poison Control- No further recommendations, patient is cleared.

## 2017-07-05 NOTE — ED Notes (Addendum)
Patient arrived to acute care unit alert and oriented x3 but sleepy.  Denies SI now HI and AV hallucinations.  Reluctant to talk to nurse about events which led up to her hospital visit.  "It's a long story."  "I don't feel like talking about it right now."  Patient requested something to drink and eat.  Nurse gave her juice and let her know breakfast would be around soon.

## 2017-07-05 NOTE — ED Notes (Signed)
Bed: RESB Expected date:  Expected time:  Means of arrival:  Comments: EMS thorazine overdose

## 2017-07-05 NOTE — ED Notes (Signed)
Patient denies SI/HI/AVH at this time. Plan of care discussed. Encouragement and support provided and safety maintain. Q 15 min safety checks remain in place and video monitoring. 

## 2017-07-05 NOTE — ED Notes (Signed)
Patient had a large diarrhea stool on floor of room.

## 2017-07-06 DIAGNOSIS — R4587 Impulsiveness: Secondary | ICD-10-CM

## 2017-07-06 DIAGNOSIS — F329 Major depressive disorder, single episode, unspecified: Secondary | ICD-10-CM | POA: Diagnosis not present

## 2017-07-06 DIAGNOSIS — F332 Major depressive disorder, recurrent severe without psychotic features: Secondary | ICD-10-CM | POA: Diagnosis not present

## 2017-07-06 DIAGNOSIS — F250 Schizoaffective disorder, bipolar type: Secondary | ICD-10-CM | POA: Diagnosis not present

## 2017-07-06 DIAGNOSIS — R45851 Suicidal ideations: Secondary | ICD-10-CM

## 2017-07-06 DIAGNOSIS — Z915 Personal history of self-harm: Secondary | ICD-10-CM

## 2017-07-06 DIAGNOSIS — T438X2A Poisoning by other psychotropic drugs, intentional self-harm, initial encounter: Secondary | ICD-10-CM | POA: Insufficient documentation

## 2017-07-06 LAB — CBG MONITORING, ED
Glucose-Capillary: 400 mg/dL — ABNORMAL HIGH (ref 65–99)
Glucose-Capillary: 181 mg/dL — ABNORMAL HIGH (ref 65–99)
Glucose-Capillary: 255 mg/dL — ABNORMAL HIGH (ref 65–99)

## 2017-07-06 NOTE — Progress Notes (Signed)
Inpatient Diabetes Program Recommendations  AACE/ADA: New Consensus Statement on Inpatient Glycemic Control (2015)  Target Ranges:  Prepandial:   less than 140 mg/dL      Peak postprandial:   less than 180 mg/dL (1-2 hours)      Critically ill patients:  140 - 180 mg/dL   Results for JISEL, FLEET (MRN [id number]) as of 07/06/2017 09:12  Ref. Range 07/05/2017 01:28 07/05/2017 08:27 07/05/2017 13:25 07/05/2017 17:59 07/06/2017 07:53  Glucose-Capillary Latest Ref Range: 65 - 99 mg/dL 365 (H) 280 (H) 319 (H) 316 (H) 181 (H)   Review of Glycemic Control  Diabetes history: DM 2 Outpatient Diabetes medication: Levemir 68 units BID, Novolog 22 units tid with meals Current orders for Inpatient glycemic control: Levemir 30 units Daily, Novolog 0-20 units tid + Novolog HS scale  Inpatient Diabetes Program Recommendations:    Glucose trends in the 200-300 range yesterday. Received Levemir 30 Daily fasting glucose 188 mg/dl. Consider increasing Levemir to 35-40 units, Consider Novolog meal coverage 5 units tid in addition to correction scale if glucose trends increase after meals and patient consumes at least 50% of meal and glucose is at least 80 mg/dl.  Thanks,  Tama Headings RN, MSN, BC-ADM, [facility] Inpatient Diabetes Coordinator Team Pager [phone] (8a-5p)

## 2017-07-06 NOTE — BHH Suicide Risk Assessment (Cosign Needed)
Suicide Risk Assessment  Discharge Assessment   [facility] Discharge Suicide Risk Assessment   Principal Problem: Schizoaffective disorder, bipolar type [facility]) Discharge Diagnoses:  Patient Active Problem List   Diagnosis Date Noted  . Suicide attempt by other psychotropic drug overdose ([place]) [T43.8X2A]   . DKA (diabetic ketoacidoses) ([place]) [E13.10] 03/14/2017  . Chronic constipation [K59.09]   . Seizures ([place]) [R56.9] 12/11/2016  . Acute lower UTI [N39.0] 12/11/2016  . DM2 (diabetes mellitus, type 2) ([place]) [E11.9] 12/11/2016  . Uncontrolled diabetes mellitus ([place]) [E11.65] 11/03/2016  . Schizoaffective disorder, bipolar type ([place]) [F25.0] 11/02/2016  . 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 Suicidal ideation [R45.851]   . 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 HTN (hypertension) [I10] 04/15/2013  . Post traumatic stress disorder [F43.10] 12/07/2011  . PSVT (paroxysmal supraventricular tachycardia) ([place]) [I47.1] 08/26/2011  . ADHD [F90.9] 09/23/2007  . EPIGASTRIC PAIN [R10.13] 09/23/2007  . Obesity, unspecified [E66.9] 07/30/2007  . Depression [F32.9] 07/30/2007  . SLEEP DISORDER [G47.9] 07/30/2007  . IMPAIRED FASTING GLUCOSE [R73.01] 07/30/2007  . ABNORMAL FINDINGS, ELEVATED BP W/O HTN [R03.0] 11/21/2006  . METRORRHAGIA [N92.1] 06/13/2006  . DISORDER, MENSTRUAL NEC [N94.9] 06/13/2006  . POLYCYSTIC OVARIAN DISEASE [E28.2] 04/25/2006  . AMENORRHEA, SECONDARY [N91.2] 04/20/2006  . ACNE, MILD [L70.8] 04/20/2006  . Abdominal pain [R10.9] 04/20/2006    Total Time spent with patient: 45 minutes  Musculoskeletal: Strength & Muscle Tone: within normal limits Gait & Station: normal Patient leans: N/A  Psychiatric Specialty Exam: Physical Exam  Constitutional: She is oriented to person, place, and time. She appears well-developed and well-nourished.  HENT:  Head: Normocephalic.  Respiratory: Effort normal.  Neurological: She is alert and oriented to  person, place, and time.  Psychiatric: Her speech is normal and behavior is normal. Thought content normal. Cognition and memory are normal. She expresses impulsivity. She exhibits a depressed mood.   Review of Systems  Psychiatric/Behavioral: Positive for depression. Negative for hallucinations, memory loss, substance abuse and suicidal ideas. The patient is not nervous/anxious and does not have insomnia.   All other systems reviewed and are negative.  Blood pressure 128/78, pulse 88, temperature 98 F (36.7 C), resp. rate 18, last menstrual period 07/02/2017, SpO2 98 %.There is no height or weight on file to calculate BMI. General Appearance: Casual Eye Contact:  Good Speech:  Clear and Coherent Volume:  Normal Mood:  Depressed Affect:  Congruent and Depressed Thought Process:  Coherent, Goal Directed and Linear Orientation:  Full (Time, Place, and Person) Thought Content:  Logical Suicidal Thoughts:  No Homicidal Thoughts:  No Memory:  Immediate;   Good Recent;   Good Remote;   Fair Judgement:  Good Insight:  Good Psychomotor Activity:  Normal Concentration:  Concentration: Good and Attention Span: Good Recall:  Good Fund of Knowledge:  Good Language:  Good Akathisia:  No Handed:  Right AIMS (if indicated):    Assets:  Agricultural consultant Housing Social Support ADL's:  Intact Cognition:  WNL Sleep:   Good   Mental Status Per Nursing Assessment::   On Admission:   Suicidal ideation  Demographic Factors:  Caucasian, Low socioeconomic status, Living alone and Unemployed  Loss Factors: Financial problems/change in socioeconomic status  Historical Factors: Impulsivity  Risk Reduction Factors:   Sense of responsibility to family  Continued Clinical Symptoms:  Bipolar Disorder:   Depressive phase Depression:   Impulsivity Previous Psychiatric Diagnoses and Treatments Medical Diagnoses and Treatments/Surgeries  Cognitive  Features That Contribute To Risk:  Closed-mindedness    Suicide Risk:  Minimal: No identifiable suicidal ideation.  Patients presenting with no risk factors but with morbid ruminations; may be classified as minimal risk based on the severity of the depressive symptoms  Follow-up Information    Pavelock, Ralene Bathe, MD. Call in 1 day.   Specialty:  Internal Medicine Contact information: [address] [place] [address] [place]. Call in 1 day.   Contact information: [place] [address] [phone]          Plan Of Care/Follow-up recommendations:  Activity:  as tolerated  Diet:  Heart Healthy  Ethelene Hal, NP 07/06/2017, 11:24 AM

## 2017-07-06 NOTE — Consult Note (Addendum)
[facility] Consult   Reason for Consult:  Suicidal Ideation Referring Physician:  EDP Patient Identification: Tina Saunders MRN:  [id number] Principal Diagnosis: Cluster B personality disorder ([place]) Diagnosis:   Patient Active Problem List   Diagnosis Date Noted  . Suicide attempt by other psychotropic drug overdose ([place]) [T43.8X2A]   . DKA (diabetic ketoacidoses) ([place]) [E13.10] 03/14/2017  . Chronic constipation [K59.09]   . Seizures ([place]) [R56.9] 12/11/2016  . Acute lower UTI [N39.0] 12/11/2016  . DM2 (diabetes mellitus, type 2) ([place]) [E11.9] 12/11/2016  . Uncontrolled diabetes mellitus ([place]) [E11.65] 11/03/2016  . Schizoaffective disorder, bipolar type ([place]) [F25.0] 11/02/2016  . 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) [F60.89] 11/02/2016  . Suicidal ideation [R45.851]   . 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 HTN (hypertension) [I10] 04/15/2013  . Post traumatic stress disorder [F43.10] 12/07/2011  . PSVT (paroxysmal supraventricular tachycardia) ([place]) [I47.1] 08/26/2011  . ADHD [F90.9] 09/23/2007  . EPIGASTRIC PAIN [R10.13] 09/23/2007  . Obesity, unspecified [E66.9] 07/30/2007  . Depression [F32.9] 07/30/2007  . SLEEP DISORDER [G47.9] 07/30/2007  . IMPAIRED FASTING GLUCOSE [R73.01] 07/30/2007  . ABNORMAL FINDINGS, ELEVATED BP W/O HTN [R03.0] 11/21/2006  . METRORRHAGIA [N92.1] 06/13/2006  . DISORDER, MENSTRUAL NEC [N94.9] 06/13/2006  . POLYCYSTIC OVARIAN DISEASE [E28.2] 04/25/2006  . AMENORRHEA, SECONDARY [N91.2] 04/20/2006  . ACNE, MILD [L70.8] 04/20/2006  . Abdominal pain [R10.9] 04/20/2006    Total Time spent with patient: 45 minutes  Subjective:   Tina Saunders is a 32 y.o. female patient admitted with suicidal ideation.  HPI:  Pt was seen and chart reviewed with treatment team and Dr Mariea Clonts.  She reportedly took 33 tablets of Chlorpromazine in an impulsive suicide attempt prior to admission. She did not present altered or somnolent to the  hospital and is very unlikely to have ingested this amount of medication. She often presents to the hospital endorsing SI for attention seeking and responds positively with listening. She is generally ready for discharge the following day. Pt denies suicidal/homicidal ideation, denies auditory/visual hallucinations and does not appear to be responding to internal stimuli. Pt stated she is able to contract for safety upon discharge. Pt's UDS and BAL are negative. Pt stated she used to have an ACT team but they discharged her because she was going to live with her brother in [place], those plans fell through so she is in the process of reconnecting with her [facility] ACT team. Pt is stable and psychiatrically clear for discharge.   Past Psychiatric History: As above  Risk to Self: None Risk to Others: None Prior Inpatient Therapy: Prior Inpatient Therapy: Yes Prior Therapy Dates: 2019 Prior Therapy Facilty/Provider(s): [facility] Reason for Treatment: MH issues Prior Outpatient Therapy: Prior Outpatient Therapy: Yes Prior Therapy Dates: 2019 Prior Therapy Facilty/Provider(s): [facility] Reason for Treatment: Med mang Does patient have an ACCT team?: Yes Does patient have Intensive In-House Services?  : No Does patient have [facility] services? : No Does patient have P4CC services?: No  Past Medical History:  Past Medical History:  Diagnosis Date  . Anxiety   . Asthma   . Bipolar 1 disorder ([place])   . Cancer of abdominal wall   . Depression   . Diabetes mellitus without complication ([place])   . High cholesterol   . Hypertension   . Obesity   . Obesity   . Polycystic ovarian syndrome 07/01/2011   Patient report  . 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 Schizophrenia ([place])   . Seizures ([place])  Past Surgical History:  Procedure Laterality Date  . CHOLECYSTECTOMY    . COLONOSCOPY WITH PROPOFOL N/A 03/08/2017   Procedure: COLONOSCOPY WITH PROPOFOL;  Surgeon: Milus Banister, MD;  Location: [facility]  ENDOSCOPY;  Service: Endoscopy;  Laterality: N/A;  . HERNIA REPAIR    . Ovarian Cyst Excision    . VARICOSE VEIN SURGERY     Family History:  Family History  Problem Relation Age of Onset  . Coronary artery disease Maternal Grandmother   . Diabetes type II Maternal Grandmother   . Cancer Maternal Grandmother   . Hypertension Mother   . Hypertension Father    Family Psychiatric  History: Unknown Social History:  Social History   Substance and Sexual Activity  Alcohol Use No     Social History   Substance and Sexual Activity  Drug Use No    Social History   Socioeconomic History  . Marital status: Single    Spouse name: Not on file  . Number of children: 0  . Years of education: Not on file  . Highest education level: Not on file  Occupational History  . Not on file  Social Needs  . Financial resource strain: Not on file  . Food insecurity:    Worry: Not on file    Inability: Not on file  . Transportation needs:    Medical: Not on file    Non-medical: Not on file  Tobacco Use  . Smoking status: Never Smoker  . Smokeless tobacco: Never Used  Substance and Sexual Activity  . Alcohol use: No  . Drug use: No  . Sexual activity: Never    Birth control/protection: None  Lifestyle  . Physical activity:    Days per week: Not on file    Minutes per session: Not on file  . Stress: Not on file  Relationships  . Social connections:    Talks on phone: Not on file    Gets together: Not on file    Attends religious service: Not on file    Active member of club or organization: Not on file    Attends meetings of clubs or organizations: Not on file    Relationship status: Not on file  Other Topics Concern  . Not on file  Social History Narrative  . Not on file   Additional Social History: N/A    Allergies:   Allergies  Allergen Reactions  . Fish-Derived Products Anaphylaxis    Can only eat [place]ounder  . Geodon [Ziprasidone Hcl] Other (See Comments)    Face  pulls, cant swallow - Locked Jaw  . Haldol [Haloperidol Lactate] Other (See Comments)    Face pulls, can't swallow - Locked Jaw  . Buprenorphine Hcl Hives, Itching, Rash and Other (See Comments)    GI upset  . Compazine [Prochlorperazine] Other (See Comments)    anxiety and hyperactivity  . Morphine And Related Hives, Itching, Rash and Other (See Comments)    GI upset  . Toradol [Ketorolac Tromethamine] Other (See Comments)    Anxiety and hyperactivity    Labs:  Results for orders placed or performed during the hospital encounter of 07/05/17 (from the past 48 hour(s))  Comprehensive metabolic panel     Status: Abnormal   Collection Time: 07/05/17  1:13 AM  Result Value Ref Range   Sodium 137 135 - 145 mmol/L   Potassium 3.7 3.5 - 5.1 mmol/L   Chloride 103 101 - 111 mmol/L   CO2 22 22 - 32 mmol/L  Glucose, Bld 427 (H) 65 - 99 mg/dL   BUN 6 6 - 20 mg/dL   Creatinine, Ser 0.51 0.44 - 1.00 mg/dL   Calcium 8.6 (L) 8.9 - 10.3 mg/dL   Total Protein 7.4 6.5 - 8.1 g/dL   Albumin 4.0 3.5 - 5.0 g/dL   AST 45 (H) 15 - 41 U/L   ALT 31 14 - 54 U/L   Alkaline Phosphatase 106 38 - 126 U/L   Total Bilirubin 0.4 0.3 - 1.2 mg/dL   GFR calc non Af Amer >60 >60 mL/min   GFR calc Af Amer >60 >60 mL/min    Comment: (NOTE) The eGFR has been calculated using the CKD EPI equation. This calculation has not been validated in all clinical situations. eGFR's persistently <60 mL/min signify possible Chronic Kidney Disease.    Anion gap 12 5 - 15    Comment: Performed at [facility], [place] [address], [place], [place] [address]  Ethanol     Status: None   Collection Time: 07/05/17  1:13 AM  Result Value Ref Range   Alcohol, Ethyl (B) <10 <10 mg/dL    Comment: (NOTE) Lowest detectable limit for serum alcohol is 10 mg/dL. For medical purposes only. Performed at [facility], [place] [address], [place], [place] [address]   Salicylate level     Status: None    Collection Time: 07/05/17  1:13 AM  Result Value Ref Range   Salicylate Lvl <5.6 2.8 - 30.0 mg/dL    Comment: Performed at [facility], [place] [address], [place], [place] [address]  Acetaminophen level     Status: Abnormal   Collection Time: 07/05/17  1:13 AM  Result Value Ref Range   Acetaminophen (Tylenol), Serum <10 (L) 10 - 30 ug/mL    Comment: (NOTE) Therapeutic concentrations vary significantly. A range of 10-30 ug/mL  may be an effective concentration for many patients. However, some  are best treated at concentrations outside of this range. Acetaminophen concentrations >150 ug/mL at 4 hours after ingestion  and >50 ug/mL at 12 hours after ingestion are often associated with  toxic reactions. Performed at [facility], [place] [address], [place], [place] [address]   cbc     Status: Abnormal   Collection Time: 07/05/17  1:13 AM  Result Value Ref Range   WBC 3.5 (L) 4.0 - 10.5 K/uL   RBC 4.44 3.87 - 5.11 MIL/uL   Hemoglobin 11.3 (L) 12.0 - 15.0 g/dL   HCT 35.4 (L) 36.0 - 46.0 %   MCV 79.7 78.0 - 100.0 fL   MCH 25.5 (L) 26.0 - 34.0 pg   MCHC 31.9 30.0 - 36.0 g/dL   RDW 15.4 11.5 - 15.5 %   Platelets 115 (L) 150 - 400 K/uL    Comment: SPECIMEN CHECKED FOR CLOTS PLATELET COUNT CONFIRMED BY SMEAR Performed at [place] [address], [place], [place] [address]   Magnesium     Status: Abnormal   Collection Time: 07/05/17  1:13 AM  Result Value Ref Range   Magnesium 1.6 (L) 1.7 - 2.4 mg/dL    Comment: Performed at [facility], [place] [address] [place], [place] [address]  I-Stat beta hCG blood, ED     Status: None   Collection Time: 07/05/17  1:20 AM  Result Value Ref Range   I-stat hCG, quantitative <5.0 <5 mIU/mL   Comment 3  Comment:   GEST. AGE      CONC.  (mIU/mL)   <=1 WEEK        5 - 50     2 WEEKS       50 - 500     3 WEEKS       100 - 10,000     4 WEEKS     1,000 - 30,000         FEMALE AND NON-PREGNANT FEMALE:     LESS THAN 5 mIU/mL   CBG monitoring, ED     Status: Abnormal   Collection Time: 07/05/17  1:28 AM  Result Value Ref Range   Glucose-Capillary 365 (H) 65 - 99 mg/dL  Rapid urine drug screen (hospital performed)     Status: None   Collection Time: 07/05/17  3:10 AM  Result Value Ref Range   Opiates NONE DETECTED NONE DETECTED   Cocaine NONE DETECTED NONE DETECTED   Benzodiazepines NONE DETECTED NONE DETECTED   Amphetamines NONE DETECTED NONE DETECTED   Tetrahydrocannabinol NONE DETECTED NONE DETECTED   Barbiturates NONE DETECTED NONE DETECTED    Comment: (NOTE) DRUG SCREEN FOR MEDICAL PURPOSES ONLY.  IF CONFIRMATION IS NEEDED FOR ANY PURPOSE, NOTIFY LAB WITHIN 5 DAYS. LOWEST DETECTABLE LIMITS FOR URINE DRUG SCREEN Drug Class                     Cutoff (ng/mL) Amphetamine and metabolites    1000 Barbiturate and metabolites    200 Benzodiazepine                 681 Tricyclics and metabolites     300 Opiates and metabolites        300 Cocaine and metabolites        300 THC                            50 Performed at [facility], [place] [address] [place], [place] [address]   CBG monitoring, ED     Status: Abnormal   Collection Time: 07/05/17  8:27 AM  Result Value Ref Range   Glucose-Capillary 280 (H) 65 - 99 mg/dL  CBG monitoring, ED     Status: Abnormal   Collection Time: 07/05/17  1:25 PM  Result Value Ref Range   Glucose-Capillary 319 (H) 65 - 99 mg/dL  CBG monitoring, ED     Status: Abnormal   Collection Time: 07/05/17  5:59 PM  Result Value Ref Range   Glucose-Capillary 316 (H) 65 - 99 mg/dL  CBG monitoring, ED     Status: Abnormal   Collection Time: 07/06/17  7:53 AM  Result Value Ref Range   Glucose-Capillary 181 (H) 65 - 99 mg/dL   Comment 1 QC Due    Comment 2 Notify RN     Current Facility-Administered Medications  Medication Dose Route Frequency Provider Last Rate Last Dose  . acetaminophen (TYLENOL)  tablet 650 mg  650 mg Oral O0B PRN Delora Fuel, MD      . alum & mag hydroxide-simeth (MAALOX/MYLANTA) 200-200-20 MG/5ML suspension 30 mL  30 mL Oral T5H PRN Delora Fuel, MD      . busPIRone (BUSPAR) tablet 15 mg  15 mg Oral BID Delora Fuel, MD   15 mg at 07/06/17 1107  . gabapentin (NEURONTIN) capsule 400 mg  741 mg Oral TID Delora Fuel, MD   638 mg at 07/06/17 1108  .  insulin aspart (novoLOG) injection 0-20 Units  0-20 Units Subcutaneous TID WC Duffy Bruce, MD   4 Units at 07/06/17 [phone]  . insulin aspart (novoLOG) injection 0-5 Units  0-5 Units Subcutaneous QHS Duffy Bruce, MD   5 Units at 07/05/17 2141  . insulin detemir (LEVEMIR) injection 30 Units  30 Units Subcutaneous Daily Duffy Bruce, MD   30 Units at 07/06/17 1109  . levETIRAcetam (KEPPRA) tablet 500 mg  657 mg Oral BID Delora Fuel, MD   846 mg at 07/06/17 1108  . lisinopril (PRINIVIL,ZESTRIL) tablet 10 mg  10 mg Oral Daily Delora Fuel, MD   10 mg at 07/06/17 1114  . ondansetron (ZOFRAN) tablet 4 mg  4 mg Oral N6E PRN Delora Fuel, MD      . QUEtiapine (SEROQUEL XR) 24 hr tablet 400 mg  952 mg Oral QHS Delora Fuel, MD   841 mg at 07/05/17 2142  . QUEtiapine (SEROQUEL) tablet 100 mg  324 mg Oral Daily Delora Fuel, MD   401 mg at 07/06/17 1108  . sertraline (ZOLOFT) tablet 200 mg  027 mg Oral Daily Delora Fuel, MD   253 mg at 07/06/17 1109  . simvastatin (ZOCOR) tablet 10 mg  10 mg Oral QHS Delora Fuel, MD   10 mg at 07/05/17 2142  . topiramate (TOPAMAX) tablet 100 mg  664 mg Oral BID Delora Fuel, MD   403 mg at 07/06/17 1108  . traZODone (DESYREL) tablet 50 mg  50 mg Oral QHS Delora Fuel, MD   50 mg at 07/05/17 2143   Current Outpatient Medications  Medication Sig Dispense Refill  . busPIRone (BUSPAR) 30 MG tablet Take 15 mg by mouth 2 (two) times daily.     . chlorproMAZINE (THORAZINE) 50 MG tablet Take 1 tablet (50 mg total) by mouth 3 (three) times daily. For agitation/mood control 90 tablet 0  . dicyclomine  (BENTYL) 20 MG tablet Take 1 tablet (20 mg total) by mouth 3 (three) times daily with meals as needed for spasms (abdominal pain and/or diarrhea). 30 tablet 0  . gabapentin (NEURONTIN) 400 MG capsule Take 1 capsule (400 mg total) by mouth 3 (three) times daily. For agitation/diabetic neuropathy 90 capsule 0  . insulin aspart (NOVOLOG) 100 UNIT/ML injection Inject 6 Units into the skin 3 (three) times daily with meals. For diabetes management (Patient taking differently: Inject 22 Units into the skin 3 (three) times daily with meals. For diabetes management) 10 mL 0  . insulin detemir (LEVEMIR) 100 UNIT/ML injection Inject 0.68 mLs (68 Units total) into the skin 2 (two) times daily. For diabetes management 10 mL 0  . levETIRAcetam (KEPPRA) 500 MG tablet Take 1 tablet (500 mg total) by mouth 2 (two) times daily. For mood stabilization 60 tablet 0  . lisinopril (PRINIVIL,ZESTRIL) 10 MG tablet Take 1 tablet (10 mg total) by mouth daily. For high blood pressure 6 tablet 0  . Melatonin 5 MG TABS Take 5 mg by mouth at bedtime.    Marland Kitchen QUEtiapine (SEROQUEL XR) 200 MG 24 hr tablet Take 400 mg by mouth at bedtime.     Marland Kitchen QUEtiapine (SEROQUEL) 100 MG tablet Take 5 tablets (500 mg total) by mouth at bedtime. For mood control (Patient taking differently: Take 100 mg by mouth daily. ) 150 tablet 0  . sertraline (ZOLOFT) 100 MG tablet Take 200 mg by mouth daily.    . simvastatin (ZOCOR) 10 MG tablet Take 10 mg by mouth at bedtime.    . topiramate (  TOPAMAX) 100 MG tablet Take 100 mg by mouth 2 (two) times daily.    . traZODone (DESYREL) 50 MG tablet Take 50 mg by mouth at bedtime.      Musculoskeletal: Strength & Muscle Tone: within normal limits Gait & Station: normal Patient leans: N/A  Psychiatric Specialty Exam: Physical Exam  Nursing note and vitals reviewed. Constitutional: She is oriented to person, place, and time. She appears well-developed and well-nourished.  HENT:  Head: Normocephalic and  atraumatic.  Neck: Normal range of motion.  Respiratory: Effort normal.  Musculoskeletal: Normal range of motion.  Neurological: She is alert and oriented to person, place, and time.  Psychiatric: Her speech is normal and behavior is normal. Thought content normal. Cognition and memory are normal. She expresses impulsivity. She exhibits a depressed mood.    Review of Systems  Psychiatric/Behavioral: Positive for depression. Negative for hallucinations, memory loss, substance abuse and suicidal ideas. The patient is not nervous/anxious and does not have insomnia.   All other systems reviewed and are negative.   Blood pressure 128/78, pulse 88, temperature 98 F (36.7 C), resp. rate 18, last menstrual period 07/02/2017, SpO2 98 %.There is no height or weight on file to calculate BMI.  General Appearance: Casual  Eye Contact:  Good  Speech:  Clear and Coherent  Volume:  Normal  Mood:  Depressed  Affect:  Congruent and Depressed  Thought Process:  Coherent, Goal Directed and Linear  Orientation:  Full (Time, Place, and Person)  Thought Content:  Logical  Suicidal Thoughts:  No  Homicidal Thoughts:  No  Memory:  Immediate;   Good Recent;   Good Remote;   Fair  Judgement:  Good  Insight:  Good  Psychomotor Activity:  Normal  Concentration:  Concentration: Good and Attention Span: Good  Recall:  Good  Fund of Knowledge:  Good  Language:  Good  Akathisia:  No  Handed:  Right  AIMS (if indicated):   N/A  Assets:  Agricultural consultant Housing Social Support  ADL's:  Intact  Cognition:  WNL  Sleep:   Good     Treatment Plan Summary: Plan Schizoaffective disorder, bipolar type ([place])  Discharge Home Take all medications as prescribed Follow up with [facility] for medication management   Disposition: No evidence of imminent risk to self or others at present.   Patient does not meet criteria for psychiatric inpatient admission. Supportive therapy  provided about ongoing stressors. Discussed crisis plan, support from social network, calling 911, coming to the Emergency Department, and calling Suicide Hotline.  Ethelene Hal, NP 07/06/2017 11:23 AM    Patient seen face-to-face for psychiatric evaluation, chart reviewed and case discussed with the physician extender and developed treatment plan. Reviewed the information documented and agree with the treatment plan.  Buford Dresser, DO 07/06/17 12:51 PM

## 2017-07-06 NOTE — BH Assessment (Addendum)
[facility] Assessment Progress Note  Per Buford Dresser, DO, this pt does not require psychiatric hospitalization at this time.  Pt presents under IVC initiated by EDP Delora Fuel, MD, which Dr Mariea Clonts has rescinded.  Pt is to be discharged from [facility] with recommendation to follow up with [facility]; she is to be advised to ask about resuming ACT Team services with them.  This has been included in pt's discharge instructions.  Pt's nurse, Caren Griffins, has been notified.  Jalene Mullet, [place] Triage Specialist [phone]

## 2017-07-06 NOTE — ED Notes (Signed)
Patient declined her lunch tray and wants to be discharged via taxi.  [employer] called after patient signed out.

## 2017-07-09 ENCOUNTER — Other Ambulatory Visit: Payer: Self-pay | Admitting: *Deleted

## 2017-07-09 NOTE — Patient Outreach (Signed)
[facility]) Care Management  07/09/2017  Tina Saunders [birth year] [id number]   RN Health Coach  Attempted screening  outreach call to patient.  Patient was unavailable. No voice mail pickup. Plan: RN will call patient again within 10 business days.   [facility] Care Management [phone]

## 2017-07-11 ENCOUNTER — Ambulatory Visit: Payer: Self-pay | Admitting: *Deleted

## 2017-07-12 ENCOUNTER — Emergency Department (HOSPITAL_COMMUNITY)
Admission: EM | Admit: 2017-07-12 | Discharge: 2017-07-12 | Disposition: A | Discharge status: Home / Self Care | Payer: Medicare HMO | Attending: Emergency Medicine | Admitting: Emergency Medicine

## 2017-07-12 ENCOUNTER — Other Ambulatory Visit: Payer: Self-pay

## 2017-07-12 DIAGNOSIS — C494 Malignant neoplasm of connective and soft tissue of abdomen: Secondary | ICD-10-CM | POA: Diagnosis not present

## 2017-07-12 DIAGNOSIS — Z794 Long term (current) use of insulin: Secondary | ICD-10-CM | POA: Insufficient documentation

## 2017-07-12 DIAGNOSIS — Z79899 Other long term (current) drug therapy: Secondary | ICD-10-CM | POA: Insufficient documentation

## 2017-07-12 DIAGNOSIS — R350 Frequency of micturition: Secondary | ICD-10-CM | POA: Diagnosis present

## 2017-07-12 DIAGNOSIS — R509 Fever, unspecified: Secondary | ICD-10-CM | POA: Diagnosis not present

## 2017-07-12 DIAGNOSIS — M545 Low back pain: Secondary | ICD-10-CM | POA: Diagnosis not present

## 2017-07-12 DIAGNOSIS — R000 Tachycardia, unspecified: Secondary | ICD-10-CM | POA: Diagnosis not present

## 2017-07-12 DIAGNOSIS — J45909 Unspecified asthma, uncomplicated: Secondary | ICD-10-CM | POA: Insufficient documentation

## 2017-07-12 DIAGNOSIS — R197 Diarrhea, unspecified: Secondary | ICD-10-CM | POA: Insufficient documentation

## 2017-07-12 DIAGNOSIS — E1165 Type 2 diabetes mellitus with hyperglycemia: Secondary | ICD-10-CM | POA: Diagnosis not present

## 2017-07-12 DIAGNOSIS — I10 Essential (primary) hypertension: Secondary | ICD-10-CM | POA: Insufficient documentation

## 2017-07-12 LAB — URINALYSIS, ROUTINE W REFLEX MICROSCOPIC
Bacteria, UA: NONE SEEN
Bilirubin Urine: NEGATIVE
Glucose, UA: >=500 mg/dL — AB
Ketones, ur: NEGATIVE mg/dL
Leukocytes, UA: NEGATIVE
Nitrite: NEGATIVE
PROTEIN: NEGATIVE mg/dL
SPECIFIC GRAVITY, URINE: 1.031 — AB (ref 1.005–1.030)
pH: 6 (ref 5.0–8.0)

## 2017-07-12 LAB — I-STAT BETA HCG BLOOD, ED (MC, WL, AP ONLY)

## 2017-07-12 LAB — CBC WITH DIFFERENTIAL/PLATELET
BASOS PCT: 0 %
Basophils Absolute: 0 10*3/uL (ref 0.0–0.1)
EOS ABS: 0.1 10*3/uL (ref 0.0–0.7)
EOS PCT: 2 %
HCT: 35.2 % — ABNORMAL LOW (ref 36.0–46.0)
HEMOGLOBIN: 11.1 g/dL — AB (ref 12.0–15.0)
LYMPHS ABS: 0.9 10*3/uL (ref 0.7–4.0)
Lymphocytes Relative: 28 %
MCH: 25.6 pg — AB (ref 26.0–34.0)
MCHC: 31.5 g/dL (ref 30.0–36.0)
MCV: 81.1 fL (ref 78.0–100.0)
Monocytes Absolute: 0.2 10*3/uL (ref 0.1–1.0)
Monocytes Relative: 7 %
NEUTROS PCT: 63 %
Neutro Abs: 2.1 10*3/uL (ref 1.7–7.7)
PLATELETS: 88 10*3/uL — AB (ref 150–400)
RBC: 4.34 MIL/uL (ref 3.87–5.11)
RDW: 15.7 % — ABNORMAL HIGH (ref 11.5–15.5)
WBC: 3.3 10*3/uL — AB (ref 4.0–10.5)

## 2017-07-12 LAB — COMPREHENSIVE METABOLIC PANEL
ALK PHOS: 109 U/L (ref 38–126)
ALT: 29 U/L (ref 14–54)
ANION GAP: 10 (ref 5–15)
AST: 45 U/L — ABNORMAL HIGH (ref 15–41)
Albumin: 4 g/dL (ref 3.5–5.0)
BILIRUBIN TOTAL: 0.8 mg/dL (ref 0.3–1.2)
BUN: 6 mg/dL (ref 6–20)
CALCIUM: 8.6 mg/dL — AB (ref 8.9–10.3)
CO2: 24 mmol/L (ref 22–32)
CREATININE: 0.53 mg/dL (ref 0.44–1.00)
Chloride: 98 mmol/L — ABNORMAL LOW (ref 101–111)
Glucose, Bld: 556 mg/dL (ref 65–99)
Potassium: 3.5 mmol/L (ref 3.5–5.1)
SODIUM: 132 mmol/L — AB (ref 135–145)
TOTAL PROTEIN: 7.7 g/dL (ref 6.5–8.1)

## 2017-07-12 LAB — BLOOD GAS, VENOUS
Acid-base deficit: 2.3 mmol/L — ABNORMAL HIGH (ref 0.0–2.0)
BICARBONATE: 21.9 mmol/L (ref 20.0–28.0)
O2 Saturation: 90.5 %
PCO2 VEN: 37.7 mmHg — AB (ref 44.0–60.0)
PH VEN: 7.383 (ref 7.250–7.430)
PO2 VEN: 66.3 mmHg — AB (ref 32.0–45.0)
Patient temperature: 98.6

## 2017-07-12 LAB — I-STAT CG4 LACTIC ACID, ED
Lactic Acid, Venous: 3.55 mmol/L (ref 0.5–1.9)
Lactic Acid, Venous: 2.31 mmol/L (ref 0.5–1.9)

## 2017-07-12 LAB — I-STAT CHEM 8, ED
CALCIUM ION: 1.14 mmol/L — AB (ref 1.15–1.40)
CREATININE: 0.3 mg/dL — AB (ref 0.44–1.00)
Chloride: 97 mmol/L — ABNORMAL LOW (ref 101–111)
Glucose, Bld: 585 mg/dL (ref 65–99)
HEMATOCRIT: 34 % — AB (ref 36.0–46.0)
HEMOGLOBIN: 11.6 g/dL — AB (ref 12.0–15.0)
Potassium: 3.5 mmol/L (ref 3.5–5.1)
Sodium: 133 mmol/L — ABNORMAL LOW (ref 135–145)
TCO2: 21 mmol/L — AB (ref 22–32)

## 2017-07-12 LAB — CBG MONITORING, ED
GLUCOSE-CAPILLARY: 378 mg/dL — AB (ref 65–99)
GLUCOSE-CAPILLARY: 346 mg/dL — AB (ref 65–99)
Glucose-Capillary: 503 mg/dL (ref 65–99)
Glucose-Capillary: 208 mg/dL — ABNORMAL HIGH (ref 65–99)

## 2017-07-12 LAB — LIPASE, BLOOD: Lipase: 28 U/L (ref 11–51)

## 2017-07-12 LAB — PHOSPHORUS: PHOSPHORUS: 4.9 mg/dL — AB (ref 2.5–4.6)

## 2017-07-12 LAB — MAGNESIUM: MAGNESIUM: 1.7 mg/dL (ref 1.7–2.4)

## 2017-07-12 MED ORDER — SODIUM CHLORIDE 0.9 % IV SOLN
1000.0000 mL | INTRAVENOUS | Status: DC
  Administered 2017-07-12: 1000 mL via INTRAVENOUS

## 2017-07-12 MED ORDER — INSULIN ASPART 100 UNIT/ML ~~LOC~~ SOLN
20.0000 [IU] | Freq: Once | SUBCUTANEOUS | Status: AC
  Administered 2017-07-12: 20 [IU] via SUBCUTANEOUS
  Filled 2017-07-12: qty 1

## 2017-07-12 MED ORDER — SODIUM CHLORIDE 0.9 % IV BOLUS (SEPSIS)
1000.0000 mL | Freq: Once | INTRAVENOUS | Status: AC
  Administered 2017-07-12: 1000 mL via INTRAVENOUS

## 2017-07-12 NOTE — ED Notes (Signed)
Bed: WA21 Expected date:  Expected time:  Means of arrival:  Comments: EMS hyperglycemia 

## 2017-07-12 NOTE — ED Triage Notes (Signed)
Transported by [facility] from home--sudden onset of increased urination and diarrhea starting yesterday. +lower back pain. CBG this morning read HI, patient administered 68 units of Levemir insulin prior to EMS arrival. CBG was 563 mg/dl with EMS.

## 2017-07-12 NOTE — ED Notes (Signed)
RN notified of abnormal lab 

## 2017-07-12 NOTE — ED Provider Notes (Addendum)
[facility] DEPT Provider Note   CSN: [id number] Arrival date & time: 07/12/17  0930     History   Chief Complaint Chief Complaint  Patient presents with  . Hyperglycemia    HPI Tina Saunders is a 32 y.o. female.  HPI Has history of insulin-dependent diabetes.  Patient reports for 2 days she has been having increased frequency of urination and frequent diarrhea.  She reports generalized achiness and malaise.  Patient also requesting something for pain for her right lower back.  She indicates her posterior iliac wing as area of pain.  Pain is aching.  She reports is been there for several days.  Denies pain burning urgency with urination.  Patient reports she had fever yesterday to 101.  She reports taking her Levemir last night as prescribed.  She cannot recall how much NovoLog she used yesterday.  She reports an hour prior to EMS arrival she took 68 units of Levemir for blood sugar reading in the 500s.  Patient was discharged from the hospital Past Medical History:  Diagnosis Date  . Anxiety   . Asthma   . Bipolar 1 disorder ([place])   . Cancer of abdominal wall   . Depression   . Diabetes mellitus without complication ([place])   . High cholesterol   . Hypertension   . Obesity   . Obesity   . Polycystic ovarian syndrome 07/01/2011   Patient report  . 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 Schizophrenia ([place])   . Seizures [facility])     Patient Active Problem List   Diagnosis Date Noted  . Suicide attempt by other psychotropic drug overdose ([place])   . DKA (diabetic ketoacidoses) ([place]) 03/14/2017  . Chronic constipation   . Seizures ([place]) 12/11/2016  . Acute lower UTI 12/11/2016  . DM2 (diabetes mellitus, type 2) ([place]) 12/11/2016  . Uncontrolled diabetes mellitus ([place]) 11/03/2016  . Schizoaffective disorder, bipolar type ([place]) 11/02/2016  . Cluster B personality disorder ([place]) 11/02/2016  . Suicidal ideation   . 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 HTN  (hypertension) 04/15/2013  . Post traumatic stress disorder 12/07/2011  . PSVT (paroxysmal supraventricular tachycardia) ([place]) 08/26/2011  . ADHD 09/23/2007  . EPIGASTRIC PAIN 09/23/2007  . Obesity, unspecified 07/30/2007  . Depression 07/30/2007  . SLEEP DISORDER 07/30/2007  . IMPAIRED FASTING GLUCOSE 07/30/2007  . ABNORMAL FINDINGS, ELEVATED BP W/O HTN 11/21/2006  . METRORRHAGIA 06/13/2006  . DISORDER, MENSTRUAL NEC 06/13/2006  . POLYCYSTIC OVARIAN DISEASE 04/25/2006  . AMENORRHEA, SECONDARY 04/20/2006  . ACNE, MILD 04/20/2006  . Abdominal pain 04/20/2006    Past Surgical History:  Procedure Laterality Date  . CHOLECYSTECTOMY    . COLONOSCOPY WITH PROPOFOL N/A 03/08/2017   Procedure: COLONOSCOPY WITH PROPOFOL;  Surgeon: Milus Banister, MD;  Location: [facility] ENDOSCOPY;  Service: Endoscopy;  Laterality: N/A;  . HERNIA REPAIR    . Ovarian Cyst Excision    . VARICOSE VEIN SURGERY       OB History    Gravida  0   Para      Term      Preterm      AB      Living        SAB      TAB      Ectopic      Multiple      Live Births               Home Medications    Prior to  Admission medications   Medication Sig Start Date End Date Taking? Authorizing Provider  busPIRone (BUSPAR) 30 MG tablet Take 45 mg by mouth 3 (three) times daily.    Yes [provider]  chlorproMAZINE (THORAZINE) 50 MG tablet Take 1 tablet (50 mg total) by mouth 3 (three) times daily. For agitation/mood control 02/09/17  Yes Lindell Spar I, NP  gabapentin (NEURONTIN) 400 MG capsule Take 1 capsule (400 mg total) by mouth 3 (three) times daily. For agitation/diabetic neuropathy 02/09/17  Yes Lindell Spar I, NP  insulin aspart (NOVOLOG) 100 UNIT/ML injection Inject 6 Units into the skin 3 (three) times daily with meals. For diabetes management Patient taking differently: Inject 22 Units into the skin 3 (three) times daily with meals. For diabetes management 02/09/17  Yes Lindell Spar I, NP    insulin detemir (LEVEMIR) 100 UNIT/ML injection Inject 0.68 mLs (68 Units total) into the skin 2 (two) times daily. For diabetes management 03/16/17  Yes Florencia Reasons, MD  levETIRAcetam (KEPPRA) 500 MG tablet Take 1 tablet (500 mg total) by mouth 2 (two) times daily. For mood stabilization 02/09/17  Yes Nwoko, Herbert Pun I, NP  lisinopril (PRINIVIL,ZESTRIL) 10 MG tablet Take 1 tablet (10 mg total) by mouth daily. For high blood pressure 02/09/17  Yes Nwoko, Agnes I, NP  Melatonin 5 MG TABS Take 5 mg by mouth at bedtime.   Yes [provider]  QUEtiapine (SEROQUEL XR) 200 MG 24 hr tablet Take 500 mg by mouth at bedtime.    Yes [provider]  QUEtiapine (SEROQUEL) 100 MG tablet Take 5 tablets (500 mg total) by mouth at bedtime. For mood control Patient taking differently: Take 100 mg by mouth daily.  02/09/17  Yes Lindell Spar I, NP  sertraline (ZOLOFT) 100 MG tablet Take 200 mg by mouth daily.   Yes [provider]  simvastatin (ZOCOR) 10 MG tablet Take 10 mg by mouth at bedtime.   Yes [provider]  topiramate (TOPAMAX) 100 MG tablet Take 100 mg by mouth 2 (two) times daily.   Yes [provider]  traZODone (DESYREL) 50 MG tablet Take 50 mg by mouth at bedtime.   Yes [provider]  dicyclomine (BENTYL) 20 MG tablet Take 1 tablet (20 mg total) by mouth 3 (three) times daily with meals as needed for spasms (abdominal pain and/or diarrhea). Patient not taking: Reported on 07/12/2017 05/26/17   Street, [place], PA-C    Family History Family History  Problem Relation Age of Onset  . Coronary artery disease Maternal Grandmother   . Diabetes type II Maternal Grandmother   . Cancer Maternal Grandmother   . Hypertension Mother   . Hypertension Father     Social History Social History   Tobacco Use  . Smoking status: Never Smoker  . Smokeless tobacco: Never Used  Substance Use Topics  . Alcohol use: No  . Drug use: No     Allergies    Fish-derived products; Geodon [ziprasidone hcl]; Haldol [haloperidol lactate]; Buprenorphine hcl; Compazine [prochlorperazine]; Morphine and related; and Toradol [ketorolac tromethamine]   Review of Systems Review of Systems 10 Systems reviewed and are negative for acute change except as noted in the HPI.   Physical Exam Updated Vital Signs BP 117/71 (BP Location: Right Arm)   Pulse 85   Temp 98.2 F (36.8 C) (Oral)   Resp 15   Ht 5\' 4"  (1.626 m)   Wt 101.2 kg (223 lb)   LMP 07/02/2017   SpO2 97%  BMI 38.28 kg/m   Physical Exam  Constitutional: She is oriented to person, place, and time. She appears well-developed and well-nourished.  HENT:  Head: Normocephalic and atraumatic.  Mouth/Throat: Oropharynx is clear and moist.  Eyes: Pupils are equal, round, and reactive to light. EOM are normal.  Neck: Neck supple.  Cardiovascular: Normal rate, regular rhythm, normal heart sounds and intact distal pulses.  Pulmonary/Chest: Effort normal and breath sounds normal.  Abdominal: Soft. Bowel sounds are normal. She exhibits no distension. There is no tenderness.  Musculoskeletal: Normal range of motion. She exhibits no edema or tenderness.  Neurological: She is alert and oriented to person, place, and time. She has normal strength. Coordination normal. GCS eye subscore is 4. GCS verbal subscore is 5. GCS motor subscore is 6.  Skin: Skin is warm, dry and intact.  Psychiatric: She has a normal mood and affect.     ED Treatments / Results  Labs (all labs ordered are listed, but only abnormal results are displayed) Labs Reviewed  COMPREHENSIVE METABOLIC PANEL - Abnormal; Notable for the following components:      Result Value   Sodium 132 (*)    Chloride 98 (*)    Glucose, Bld 556 (*)    Calcium 8.6 (*)    AST 45 (*)    All other components within normal limits  CBC WITH DIFFERENTIAL/PLATELET - Abnormal; Notable for the following components:   WBC 3.3 (*)    Hemoglobin 11.1  (*)    HCT 35.2 (*)    MCH 25.6 (*)    RDW 15.7 (*)    Platelets 88 (*)    All other components within normal limits  URINALYSIS, ROUTINE W REFLEX MICROSCOPIC - Abnormal; Notable for the following components:   Color, Urine STRAW (*)    Specific Gravity, Urine 1.031 (*)    Glucose, UA >=500 (*)    Hgb urine dipstick SMALL (*)    All other components within normal limits  BLOOD GAS, VENOUS - Abnormal; Notable for the following components:   pCO2, Ven 37.7 (*)    pO2, Ven 66.3 (*)    Acid-base deficit 2.3 (*)    All other components within normal limits  PHOSPHORUS - Abnormal; Notable for the following components:   Phosphorus 4.9 (*)    All other components within normal limits  CBG MONITORING, ED - Abnormal; Notable for the following components:   Glucose-Capillary 503 (*)    All other components within normal limits  I-STAT CHEM 8, ED - Abnormal; Notable for the following components:   Sodium 133 (*)    Chloride 97 (*)    BUN <3 (*)    Creatinine, Ser 0.30 (*)    Glucose, Bld 585 (*)    Calcium, Ion 1.14 (*)    TCO2 21 (*)    Hemoglobin 11.6 (*)    HCT 34.0 (*)    All other components within normal limits  I-STAT CG4 LACTIC ACID, ED - Abnormal; Notable for the following components:   Lactic Acid, Venous 3.55 (*)    All other components within normal limits  I-STAT CG4 LACTIC ACID, ED - Abnormal; Notable for the following components:   Lactic Acid, Venous 2.31 (*)    All other components within normal limits  CBG MONITORING, ED - Abnormal; Notable for the following components:   Glucose-Capillary 378 (*)    All other components within normal limits  CBG MONITORING, ED - Abnormal; Notable for the following components:   Glucose-Capillary  346 (*)    All other components within normal limits  CBG MONITORING, ED - Abnormal; Notable for the following components:   Glucose-Capillary 208 (*)    All other components within normal limits  LIPASE, BLOOD  MAGNESIUM  I-STAT  BETA HCG BLOOD, ED ([facility], [facility], [facility] ONLY)  CBG MONITORING, ED    EKG EKG Interpretation  Date/Time:  Thursday July 12 2017 09:55:30 EDT Ventricular Rate:  103 PR Interval:    QRS Duration: 89 QT Interval:  381 QTC Calculation: 499 R Axis:   -29 Text Interpretation:  Sinus tachycardia Abnormal Q suggests anterior infarct Inferior infarct, old Prolonged QT interval no acute ischemic change, no sig change from old Confirmed by Charlesetta Shanks [id number]) on 07/12/2017 3:09:16 PM   Radiology No results found.  Procedures Procedures (including critical care time) CRITICAL CARE Performed by: Charlesetta Shanks   Total critical care time: 30 minutes  Critical care time was exclusive of separately billable procedures and treating other patients.  Critical care was necessary to treat or prevent imminent or life-threatening deterioration.  Critical care was time spent personally by me on the following activities: development of treatment plan with patient and/or surrogate as well as nursing, discussions with consultants, evaluation of patient's response to treatment, examination of patient, obtaining history from patient or surrogate, ordering and performing treatments and interventions, ordering and review of laboratory studies, ordering and review of radiographic studies, pulse oximetry and re-evaluation of patient's condition. Medications Ordered in ED Medications  sodium chloride 0.9 % bolus 1,000 mL (0 mLs Intravenous Stopped 07/12/17 1151)    Followed by  0.9 %  sodium chloride infusion (1,000 mLs Intravenous New Bag/Given 07/12/17 1201)  insulin aspart (novoLOG) injection 20 Units (20 Units Subcutaneous Given 07/12/17 1201)     Initial Impression / Assessment and Plan / ED Course  I have reviewed the triage vital signs and the nursing notes.  Pertinent labs & imaging results that were available during my care of the patient were reviewed by me and considered in my medical decision making  (see chart for details).      Final Clinical Impressions(s) / ED Diagnoses   Final diagnoses:  Type 2 diabetes mellitus with hyperglycemia, with long-term current use of insulin ([place])  Presents with significant hyperglycemia.  She presents with generalized complaint of feeling weak, nauseated and urinary frequency.  Patient is clinically well in appearance.  She is alert and appropriate.  No confusion or somnolence.  Physical examination normal without signs of infectious etiology.  urinalysis negative.  Patient reportedly took her Levemir yesterday and this morning.  No signs of DKA with  pH, anion gap and bicarb within normal limits.  Patient was given regular insulin and fluids.  Blood sugar has come down to the 208 and vital signs normal.  At this time patient remained stable.  She is stable to resume her evening regular insulin dose and Levemir.  She is counseled to call her endocrinologist or her family doctor in the morning to reevaluate her medications and make adjustments if indicated.  Patient was recently treated for suicidal thoughts and attempt.  She denies any suicidal ideation or medication overdose.  Her demeanor has been normal without signs of major depression or psychosis.  ED Discharge Orders    None       Charlesetta Shanks, MD 07/12/17 1513    Charlesetta Shanks, MD 07/12/17 [phone]

## 2017-07-12 NOTE — Discharge Instructions (Addendum)
Take your evening dose of Levemir as prescribed.  Take your regular insulin on sliding scale.  Continue to monitor your blood sugars and keep a log.  Follow diabetic diet.  To call your family doctor or endocrinologist tomorrow to review your insulin dosing.

## 2017-07-12 NOTE — ED Notes (Signed)
Dr Johnney Killian notified of patient's lactic acid and chem 8 results.

## 2017-07-12 NOTE — ED Notes (Signed)
MD AND RN NOTIFIED OF PATIENT'S LACTIC ACID LEVEL OF 2.31 

## 2017-07-17 ENCOUNTER — Encounter (HOSPITAL_COMMUNITY): Payer: Self-pay | Admitting: Emergency Medicine

## 2017-07-17 ENCOUNTER — Emergency Department (HOSPITAL_COMMUNITY)
Admission: EM | Admit: 2017-07-17 | Discharge: 2017-07-17 | Disposition: A | Discharge status: Home / Self Care | Payer: Medicare HMO | Attending: Emergency Medicine | Admitting: Emergency Medicine

## 2017-07-17 DIAGNOSIS — Z760 Encounter for issue of repeat prescription: Secondary | ICD-10-CM | POA: Diagnosis not present

## 2017-07-17 DIAGNOSIS — J45909 Unspecified asthma, uncomplicated: Secondary | ICD-10-CM | POA: Diagnosis not present

## 2017-07-17 DIAGNOSIS — Z79899 Other long term (current) drug therapy: Secondary | ICD-10-CM | POA: Diagnosis not present

## 2017-07-17 DIAGNOSIS — Z85028 Personal history of other malignant neoplasm of stomach: Secondary | ICD-10-CM | POA: Diagnosis not present

## 2017-07-17 DIAGNOSIS — Z794 Long term (current) use of insulin: Secondary | ICD-10-CM | POA: Insufficient documentation

## 2017-07-17 DIAGNOSIS — R1030 Lower abdominal pain, unspecified: Secondary | ICD-10-CM | POA: Diagnosis not present

## 2017-07-17 DIAGNOSIS — E1165 Type 2 diabetes mellitus with hyperglycemia: Secondary | ICD-10-CM | POA: Diagnosis not present

## 2017-07-17 DIAGNOSIS — I10 Essential (primary) hypertension: Secondary | ICD-10-CM | POA: Diagnosis not present

## 2017-07-17 DIAGNOSIS — R350 Frequency of micturition: Secondary | ICD-10-CM | POA: Diagnosis not present

## 2017-07-17 DIAGNOSIS — R52 Pain, unspecified: Secondary | ICD-10-CM | POA: Diagnosis not present

## 2017-07-17 DIAGNOSIS — R739 Hyperglycemia, unspecified: Secondary | ICD-10-CM

## 2017-07-17 DIAGNOSIS — R112 Nausea with vomiting, unspecified: Secondary | ICD-10-CM | POA: Diagnosis not present

## 2017-07-17 LAB — URINALYSIS, ROUTINE W REFLEX MICROSCOPIC
Bilirubin Urine: NEGATIVE
Glucose, UA: >=500 mg/dL — AB
Ketones, ur: NEGATIVE mg/dL
Nitrite: NEGATIVE
PROTEIN: NEGATIVE mg/dL
SPECIFIC GRAVITY, URINE: 1.033 — AB (ref 1.005–1.030)
pH: 6 (ref 5.0–8.0)

## 2017-07-17 LAB — BASIC METABOLIC PANEL
ANION GAP: 13 (ref 5–15)
BUN: 5 mg/dL — AB (ref 6–20)
CO2: 21 mmol/L — AB (ref 22–32)
Calcium: 8.9 mg/dL (ref 8.9–10.3)
Chloride: 97 mmol/L — ABNORMAL LOW (ref 98–111)
Creatinine, Ser: 0.64 mg/dL (ref 0.44–1.00)
GFR calc Af Amer: >60 mL/min (ref >60)
GFR calc non Af Amer: >60 mL/min (ref >60)
GLUCOSE: 532 mg/dL — AB (ref 70–99)
Potassium: 3.8 mmol/L (ref 3.5–5.1)
Sodium: 131 mmol/L — ABNORMAL LOW (ref 135–145)

## 2017-07-17 LAB — CBG MONITORING, ED
GLUCOSE-CAPILLARY: 525 mg/dL — AB (ref 70–99)
GLUCOSE-CAPILLARY: 322 mg/dL — AB (ref 70–99)
Glucose-Capillary: 425 mg/dL — ABNORMAL HIGH (ref 70–99)

## 2017-07-17 LAB — CBC
HEMATOCRIT: 39 % (ref 36.0–46.0)
Hemoglobin: 12.1 g/dL (ref 12.0–15.0)
MCH: 25.3 pg — AB (ref 26.0–34.0)
MCHC: 31 g/dL (ref 30.0–36.0)
MCV: 81.6 fL (ref 78.0–100.0)
Platelets: 120 10*3/uL — ABNORMAL LOW (ref 150–400)
RBC: 4.78 MIL/uL (ref 3.87–5.11)
RDW: 15.8 % — ABNORMAL HIGH (ref 11.5–15.5)
WBC: 4.8 10*3/uL (ref 4.0–10.5)

## 2017-07-17 LAB — I-STAT BETA HCG BLOOD, ED (MC, WL, AP ONLY)

## 2017-07-17 MED ORDER — INSULIN ASPART 100 UNIT/ML ~~LOC~~ SOLN: 6.0000 [IU] | Freq: Three times a day (TID) | SUBCUTANEOUS | 0 refills | Status: DC

## 2017-07-17 MED ORDER — "INSULIN SYRINGE-NEEDLE U-100 25G X 1"" 1 ML MISC": 1.0000 | Freq: Three times a day (TID) | 0 refills | Status: DC

## 2017-07-17 MED ORDER — ACETAMINOPHEN 500 MG PO TABS
1000.0000 mg | ORAL_TABLET | Freq: Once | ORAL | Status: AC
  Administered 2017-07-17: 1000 mg via ORAL
  Filled 2017-07-17: qty 2

## 2017-07-17 MED ORDER — SODIUM CHLORIDE 0.9 % IV BOLUS
1000.0000 mL | Freq: Once | INTRAVENOUS | Status: AC
  Administered 2017-07-17: 1000 mL via INTRAVENOUS

## 2017-07-17 MED ORDER — INSULIN ASPART 100 UNIT/ML ~~LOC~~ SOLN
10.0000 [IU] | Freq: Once | SUBCUTANEOUS | Status: AC
  Administered 2017-07-17: 10 [IU] via INTRAVENOUS
  Filled 2017-07-17: qty 1

## 2017-07-17 MED ORDER — INSULIN DETEMIR 100 UNIT/ML ~~LOC~~ SOLN: 68.0000 [IU] | Freq: Two times a day (BID) | SUBCUTANEOUS | 0 refills | Status: DC

## 2017-07-17 NOTE — ED Provider Notes (Signed)
Physical Exam  BP (!) 147/88   Pulse 98   Temp 98.7 F (37.1 C) (Oral)   Resp 19   Ht 5\' 4"  (1.626 m)   Wt 101.6 kg (224 lb)   LMP 07/02/2017   SpO2 94%   BMI 38.45 kg/m   Assumed care from [employer], PA-C at 0630. Briefly, the patient is a 32 y.o. 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 here with hyperglycemia. Patient states that she tested her blood sugar yesterday 600 so she came to the hospital.  Patient reports she has been out of her insulin for 3 to 5 days.  Patient reports that she does have a primary care provider, but has had transportation issues getting her.  Patient does confirm that she has Medicaid transportation, and will contact them today to establish primary care.  Patient without complaints at this time, and is not complaining of abdominal pain.  Labs Reviewed  BASIC METABOLIC PANEL - Abnormal; Notable for the following components:      Result Value   Sodium 131 (*)    Chloride 97 (*)    CO2 21 (*)    Glucose, Bld 532 (*)    BUN 5 (*)    All other components within normal limits  CBC - Abnormal; Notable for the following components:   MCH 25.3 (*)    RDW 15.8 (*)    Platelets 120 (*)    All other components within normal limits  URINALYSIS, ROUTINE W REFLEX MICROSCOPIC - Abnormal; Notable for the following components:   APPearance HAZY (*)    Specific Gravity, Urine 1.033 (*)    Glucose, UA >=500 (*)    Hgb urine dipstick LARGE (*)    Leukocytes, UA SMALL (*)    Bacteria, UA RARE (*)    All other components within normal limits  CBG MONITORING, ED - Abnormal; Notable for the following components:   Glucose-Capillary 525 (*)    All other components within normal limits   CBG MONITORING, ED - Abnormal; Notable for the following components:   Glucose-Capillary 425 (*)    All other components within normal limits  CBG MONITORING, ED - Abnormal; Notable for the following components:   Glucose-Capillary 322 (*)    All other components within normal limits  I-STAT BETA HCG BLOOD, ED ([facility], [facility], [facility] ONLY)    Course of Care:   Physical Exam  Constitutional: She appears well-developed and well-nourished. No distress.  Sitting comfortably in bed.  HENT:  Head: Normocephalic and atraumatic.  Eyes: Conjunctivae are normal. Right eye exhibits no discharge. Left eye exhibits no discharge.  EOMs normal to gross examination.  Neck: Normal range of motion.  Cardiovascular: Normal rate, regular rhythm and normal heart sounds.  No murmur heard. Pulmonary/Chest: Effort normal and breath sounds normal. She has no wheezes. She has no rales.  Normal respiratory effort. Patient converses comfortably. No audible wheeze or stridor.  Abdominal: She exhibits no distension.  Musculoskeletal: Normal range of motion.  Neurological: She is alert.  Cranial nerves intact to gross observation. Patient moves extremities without difficulty.  Skin: Skin is warm and dry. She is not diaphoretic.  Psychiatric: She has a normal mood and affect. Her behavior is normal. Judgment and thought content normal.  Nursing note and vitals  reviewed.   ED Course/Procedures     Procedures  MDM  Patient received 2 L of normal saline as well as 20 units of NovoLog.  Glucose reduced to 322.  Patient exhibits hyponatremia of 131, likely factitious in the setting of hyperglycemia. No anion gap. Lab work otherwise unremarkable.  Insulin was refilled for patient as well as supplies.  I discussed with the patient that it is vital that she follow-up with primary care in order to make insulin adjustments.  Patient discussed obtaining Medicaid transportation.  Patient is in understanding and agrees with plan  of care.       Albesa Seen, PA-C 07/17/17 [id number]    Merryl Hacker, MD 07/17/17 [phone]

## 2017-07-17 NOTE — ED Triage Notes (Signed)
Pt arrives via EMS from home for hyperglcyemia, states she has been out of insulin for 5 days and unable to get transport to see her PCP for refills. Reports fever today, tylenol effective for temp. Also reports R lower abd pain and vomiting 6 times today.

## 2017-07-17 NOTE — ED Notes (Signed)
Pt states she was not out of insulin when she was seen on the 20th, pt states she has not followed up with MD and has only been out of insulin x 3 days.

## 2017-07-17 NOTE — ED Provider Notes (Signed)
[facility] EMERGENCY DEPARTMENT Provider Note   CSN: [id number] Arrival date & time: 07/17/17  0148     History   Chief Complaint Chief Complaint  Patient presents with  . Hyperglycemia    HPI Tina Saunders is a 32 y.o. female.  Patient presents to the emergency department with a chief complaint of hyperglycemia.  She states that she has been unable to make it to the doctor to have her insulin refilled.  She reports that her blood sugar has been running high for the past several days.  She reports some associated nausea and vomiting.  She also reports urinary frequency.  She denies any fevers chills.  She states that she does have some lower abdominal discomfort.  She states that she has irregular periods.  She denies any other associated symptoms.  There are no aggravating or alleviating factors.     Past Medical History:  Diagnosis Date  . Anxiety   . Asthma   . Bipolar 1 disorder ([place])   . Cancer of abdominal wall   . Depression   . Diabetes mellitus without complication ([place])   . High cholesterol   . Hypertension   . Obesity   . Obesity   . Polycystic ovarian syndrome 07/01/2011   Patient report  . 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 Schizophrenia ([place])   . Seizures [facility])     Patient Active Problem List   Diagnosis Date Noted  . Suicide attempt by other psychotropic drug overdose ([place])   . DKA (diabetic ketoacidoses) ([place]) 03/14/2017  . Chronic constipation   . Seizures ([place]) 12/11/2016  . Acute lower UTI 12/11/2016  . DM2 (diabetes mellitus, type 2) ([place]) 12/11/2016  . Uncontrolled diabetes mellitus ([place]) 11/03/2016  . Schizoaffective disorder, bipolar type ([name]) 11/02/2016  . Cluster B personality disorder ([place]) 11/02/2016  . Suicidal ideation   . 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 HTN (hypertension) 04/15/2013  . Post traumatic stress disorder 12/07/2011  . PSVT (paroxysmal supraventricular tachycardia) ([place]) 08/26/2011  . ADHD 09/23/2007    . EPIGASTRIC PAIN 09/23/2007  . Obesity, unspecified 07/30/2007  . Depression 07/30/2007  . SLEEP DISORDER 07/30/2007  . IMPAIRED FASTING GLUCOSE 07/30/2007  . ABNORMAL FINDINGS, ELEVATED BP W/O HTN 11/21/2006  . METRORRHAGIA 06/13/2006  . DISORDER, MENSTRUAL NEC 06/13/2006  . POLYCYSTIC OVARIAN DISEASE 04/25/2006  . AMENORRHEA, SECONDARY 04/20/2006  . ACNE, MILD 04/20/2006  . Abdominal pain 04/20/2006    Past Surgical History:  Procedure Laterality Date  . CHOLECYSTECTOMY    . COLONOSCOPY WITH PROPOFOL N/A 03/08/2017   Procedure: COLONOSCOPY WITH PROPOFOL;  Surgeon: Milus Banister, MD;  Location: [facility] END[facility]COPY;  Service: Endoscopy;  Laterality: N/A;  . HERNIA REPAIR    . Ovarian Cyst Excision    . VARICOSE VEIN SURGERY       OB History    Gravida  0   Para      Term      Preterm      AB      Living        SAB      TAB      Ectopic      Multiple      Live Births               Home Medications    Prior to Admission medications   Medication Sig Start Date End Date Taking? Authorizing Provider  busPIRone (BUSPAR) 30 MG tablet Take 45 mg by mouth  3 (three) times daily.     [provider]  chlorproMAZINE (THORAZINE) 50 MG tablet Take 1 tablet (50 mg total) by mouth 3 (three) times daily. For agitation/mood control 02/09/17   Lindell Spar I, NP  dicyclomine (BENTYL) 20 MG tablet Take 1 tablet (20 mg total) by mouth 3 (three) times daily with meals as needed for spasms (abdominal pain and/or diarrhea). Patient not taking: Reported on 07/12/2017 05/26/17   Street, [place], PA-C  gabapentin (NEURONTIN) 400 MG capsule Take 1 capsule (400 mg total) by mouth 3 (three) times daily. For agitation/diabetic neuropathy 02/09/17   Lindell Spar I, NP  insulin aspart (NOVOLOG) 100 UNIT/ML injection Inject 6 Units into the skin 3 (three) times daily with meals. For diabetes management Patient taking differently: Inject 22 Units into the skin 3 (three) times daily  with meals. For diabetes management 02/09/17   Lindell Spar I, NP  insulin detemir (LEVEMIR) 100 UNIT/ML injection Inject 0.68 mLs (68 Units total) into the skin 2 (two) times daily. For diabetes management 03/16/17   Florencia Reasons, MD  levETIRAcetam (KEPPRA) 500 MG tablet Take 1 tablet (500 mg total) by mouth 2 (two) times daily. For mood stabilization 02/09/17   Lindell Spar I, NP  lisinopril (PRINIVIL,ZESTRIL) 10 MG tablet Take 1 tablet (10 mg total) by mouth daily. For high blood pressure 02/09/17   [place], Herbert Pun I, NP  Melatonin 5 MG TABS Take 5 mg by mouth at bedtime.    [provider]  QUEtiapine (SEROQUEL XR) 200 MG 24 hr tablet Take 500 mg by mouth at bedtime.     [provider]  QUEtiapine (SEROQUEL) 100 MG tablet Take 5 tablets (500 mg total) by mouth at bedtime. For mood control Patient taking differently: Take 100 mg by mouth daily.  02/09/17   Lindell Spar I, NP  sertraline (ZOLOFT) 100 MG tablet Take 200 mg by mouth daily.    [provider]  simvastatin (ZOCOR) 10 MG tablet Take 10 mg by mouth at bedtime.    [provider]  topiramate (TOPAMAX) 100 MG tablet Take 100 mg by mouth 2 (two) times daily.    [provider]  traZODone (DESYREL) 50 MG tablet Take 50 mg by mouth at bedtime.    [provider]    Family History Family History  Problem Relation Age of Onset  . Coronary artery disease Maternal Grandmother   . Diabetes type II Maternal Grandmother   . Cancer Maternal Grandmother   . Hypertension Mother   . Hypertension Father     Social History Social History   Tobacco Use  . Smoking status: Never Smoker  . Smokeless tobacco: Never Used  Substance Use Topics  . Alcohol use: No  . Drug use: No     Allergies   Fish-derived products; Geodon [ziprasidone hcl]; Haldol [haloperidol lactate]; Buprenorphine hcl; Compazine [prochlorperazine]; Morphine and related; and Toradol [ketorolac tromethamine]   Review of  Systems Review of Systems  All other systems reviewed and are negative.    Physical Exam Updated Vital Signs BP (!) 143/96 (BP Location: Left Arm)   Pulse 99   Temp 98.7 F (37.1 C) (Oral)   Resp 16   Ht 5\' 4"  (1.626 m)   Wt 101.6 kg (224 lb)   LMP 07/02/2017   SpO2 99%   BMI 38.45 kg/m   Physical Exam  Constitutional: She is oriented to person, place, and time. She appears well-developed and well-nourished.  Patient in no acute distress  HENT:  Head: Normocephalic and atraumatic.  Eyes: Pupils are equal, round, and reactive to light. Conjunctivae and EOM are normal.  Neck: Normal range of motion. Neck supple.  Cardiovascular: Normal rate and regular rhythm. Exam reveals no gallop and no friction rub.  No murmur heard. Pulmonary/Chest: Effort normal and breath sounds normal. No respiratory distress. She has no wheezes. She has no rales. She exhibits no tenderness.  Abdominal: Soft. Bowel sounds are normal. She exhibits no distension and no mass. There is no tenderness. There is no rebound and no guarding.  Some right lower abdominal discomfort  Musculoskeletal: Normal range of motion. She exhibits no edema or tenderness.  Neurological: She is alert and oriented to person, place, and time.  Skin: Skin is warm and dry.  Psychiatric: She has a normal mood and affect. Her behavior is normal. Judgment and thought content normal.  Nursing note and vitals reviewed.    ED Treatments / Results  Labs (all labs ordered are listed, but only abnormal results are displayed) Labs Reviewed  BASIC METABOLIC PANEL - Abnormal; Notable for the following components:      Result Value   Sodium 131 (*)    Chloride 97 (*)    CO2 21 (*)    Glucose, Bld 532 (*)    BUN 5 (*)    All other components within normal limits  CBC - Abnormal; Notable for the following components:   MCH 25.3 (*)    RDW 15.8 (*)    Platelets 120 (*)    All other components within normal limits  URINALYSIS,  ROUTINE W REFLEX MICROSCOPIC - Abnormal; Notable for the following components:   APPearance HAZY (*)    Specific Gravity, Urine 1.033 (*)    Glucose, UA >=500 (*)    Hgb urine dipstick LARGE (*)    Leukocytes, UA SMALL (*)    Bacteria, UA RARE (*)    All other components within normal limits  CBG MONITORING, ED - Abnormal; Notable for the following components:   Glucose-Capillary 525 (*)    All other components within normal limits  I-STAT BETA HCG BLOOD, ED ([facility], [facility], [facility] ONLY)    EKG None  Radiology No results found.  Procedures Procedures (including critical care time)  Medications Ordered in ED Medications  sodium chloride 0.9 % bolus 1,000 mL (1,000 mLs Intravenous New Bag/Given 07/17/17 0433)  insulin aspart (novoLOG) injection 10 Units (10 Units Intravenous Given 07/17/17 0438)     Initial Impression / Assessment and Plan / ED Course  I have reviewed the triage vital signs and the nursing notes.  Pertinent labs & imaging results that were available during my care of the patient were reviewed by me and considered in my medical decision making (see chart for details).     Patient with hyperglycemia.  She is out of her insulin.  Will give fluids and insulin in the emergency department.  She does report that she has had some vomiting, but has not been vomiting in the emergency department.  She is in no acute distress.  Lab work shows hyperglycemia to the 500s.  Sodium, chloride are slightly low.  She does not have an elevated anion gap. she has no leukocytosis.  No evidence of UTI.  Her vital signs are stable.  She is afebrile.  She does have some discomfort with abdominal palpation, however I have a low suspicion for acute abdomen.  The patient has had 4 CT scans of her abdomen in the past  5 months.  She is also had ultrasounds which have all been reassuring.  Looking back into her record even further shows numerous CT scans and ultrasounds.  I doubt that any additional advanced  imaging will you would yield any benefit today.  We will treat the patient for her hyperglycemia and recommend primary care and OB/GYN follow-up.  CBG is now 425, will give an additional dose of insulin and fluids.  Anticipate discharge home.  Patient given Tylenol for abdominal discomfort.  Recommend OB/GYN follow-up for irregular periods.  Will refill the patient's insulin.  Final Clinical Impressions(s) / ED Diagnoses   Final diagnoses:  Hyperglycemia    ED Discharge Orders        Ordered    insulin aspart (NOVOLOG) 100 UNIT/ML injection  3 times daily with meals     07/17/17 0642       Montine Circle, PA-C 07/17/17 7408    Dina Rich, Barbette Hair, MD 07/17/17 [phone]

## 2017-07-21 ENCOUNTER — Encounter (HOSPITAL_COMMUNITY): Payer: Self-pay | Admitting: Emergency Medicine

## 2017-07-21 ENCOUNTER — Emergency Department (HOSPITAL_COMMUNITY): Payer: Medicare HMO

## 2017-07-21 ENCOUNTER — Emergency Department (HOSPITAL_COMMUNITY)
Admission: EM | Admit: 2017-07-21 | Discharge: 2017-07-21 | Disposition: A | Discharge status: Home / Self Care | Payer: Medicare HMO | Attending: Emergency Medicine | Admitting: Emergency Medicine

## 2017-07-21 DIAGNOSIS — E7800 Pure hypercholesterolemia, unspecified: Secondary | ICD-10-CM | POA: Diagnosis not present

## 2017-07-21 DIAGNOSIS — Z794 Long term (current) use of insulin: Secondary | ICD-10-CM | POA: Insufficient documentation

## 2017-07-21 DIAGNOSIS — Z79899 Other long term (current) drug therapy: Secondary | ICD-10-CM | POA: Insufficient documentation

## 2017-07-21 DIAGNOSIS — J45909 Unspecified asthma, uncomplicated: Secondary | ICD-10-CM | POA: Insufficient documentation

## 2017-07-21 DIAGNOSIS — R0602 Shortness of breath: Secondary | ICD-10-CM | POA: Diagnosis not present

## 2017-07-21 DIAGNOSIS — R079 Chest pain, unspecified: Secondary | ICD-10-CM | POA: Diagnosis not present

## 2017-07-21 DIAGNOSIS — R05 Cough: Secondary | ICD-10-CM | POA: Diagnosis not present

## 2017-07-21 DIAGNOSIS — E119 Type 2 diabetes mellitus without complications: Secondary | ICD-10-CM | POA: Insufficient documentation

## 2017-07-21 DIAGNOSIS — R0789 Other chest pain: Secondary | ICD-10-CM | POA: Diagnosis not present

## 2017-07-21 DIAGNOSIS — I10 Essential (primary) hypertension: Secondary | ICD-10-CM | POA: Diagnosis not present

## 2017-07-21 LAB — COMPREHENSIVE METABOLIC PANEL
ALK PHOS: 94 U/L (ref 38–126)
ALT: 20 U/L (ref 0–44)
ANION GAP: 11 (ref 5–15)
AST: 38 U/L (ref 15–41)
Albumin: 3.2 g/dL — ABNORMAL LOW (ref 3.5–5.0)
BILIRUBIN TOTAL: 0.6 mg/dL (ref 0.3–1.2)
BUN: <5 mg/dL — ABNORMAL LOW (ref 6–20)
CALCIUM: 8.3 mg/dL — AB (ref 8.9–10.3)
CO2: 22 mmol/L (ref 22–32)
CREATININE: 0.51 mg/dL (ref 0.44–1.00)
Chloride: 100 mmol/L (ref 98–111)
GFR calc non Af Amer: >60 mL/min (ref >60)
Glucose, Bld: 448 mg/dL — ABNORMAL HIGH (ref 70–99)
Potassium: 3.7 mmol/L (ref 3.5–5.1)
Sodium: 133 mmol/L — ABNORMAL LOW (ref 135–145)
TOTAL PROTEIN: 6.6 g/dL (ref 6.5–8.1)

## 2017-07-21 LAB — CBC
HCT: 35.8 % — ABNORMAL LOW (ref 36.0–46.0)
Hemoglobin: 10.9 g/dL — ABNORMAL LOW (ref 12.0–15.0)
MCH: 25.1 pg — AB (ref 26.0–34.0)
MCHC: 30.4 g/dL (ref 30.0–36.0)
MCV: 82.5 fL (ref 78.0–100.0)
PLATELETS: 100 10*3/uL — AB (ref 150–400)
RBC: 4.34 MIL/uL (ref 3.87–5.11)
RDW: 15.5 % (ref 11.5–15.5)
WBC: 3.8 10*3/uL — ABNORMAL LOW (ref 4.0–10.5)

## 2017-07-21 LAB — I-STAT BETA HCG BLOOD, ED (MC, WL, AP ONLY): I-stat hCG, quantitative: <5 m[IU]/mL (ref <5)

## 2017-07-21 LAB — I-STAT TROPONIN, ED: TROPONIN I, POC: 0 ng/mL (ref 0.00–0.08)

## 2017-07-21 LAB — D-DIMER, QUANTITATIVE (NOT AT ARMC)

## 2017-07-21 LAB — CBG MONITORING, ED: Glucose-Capillary: 464 mg/dL — ABNORMAL HIGH (ref 70–99)

## 2017-07-21 MED ORDER — SODIUM CHLORIDE 0.9 % IV BOLUS
1000.0000 mL | Freq: Once | INTRAVENOUS | Status: DC
Start: 2017-07-21 — End: 2017-07-21

## 2017-07-21 MED ORDER — BENZONATATE 100 MG PO CAPS
100.0000 mg | ORAL_CAPSULE | Freq: Once | ORAL | Status: AC
  Administered 2017-07-21: 100 mg via ORAL
  Filled 2017-07-21: qty 1

## 2017-07-21 MED ORDER — IBUPROFEN 200 MG PO TABS
600.0000 mg | ORAL_TABLET | Freq: Once | ORAL | Status: AC
  Administered 2017-07-21: 600 mg via ORAL
  Filled 2017-07-21: qty 1

## 2017-07-21 MED ORDER — SODIUM CHLORIDE 0.9 % IV BOLUS: 1000.0000 mL | Freq: Once | INTRAVENOUS | Status: DC

## 2017-07-21 NOTE — ED Triage Notes (Signed)
Per EMS:  Patient presents to ED after waking up this morning with CP approx 10am.  Worse with inspiration, worse with coughing.  Pt has non-productive cough, worsening x 3 days.  97% on RA during transport.  Pt hx of DM with CBG 418 en route.  States she has been taking all of her medicines, but they have been struggling with control "for a while".  41ml NaCl given en route.  VSS (HR 108)

## 2017-07-21 NOTE — ED Notes (Signed)
Patient did not want to have the fluids run nor await for the results of the D Dimer.  Patient states she just needs to go and does not wish to wait any longer. Patient made aware of the risks of leaving against medical advice and encouraged to return immediately of the chest pain returns.

## 2017-07-21 NOTE — ED Provider Notes (Signed)
[facility] EMERGENCY DEPARTMENT Provider Note   CSN: [id number] Arrival date & time: 07/21/17  1402     History   Chief Complaint Chief Complaint  Patient presents with  . Chest Pain    HPI Tina Saunders is a 32 y.o. female who presents with chest pain and SOB. PMH significant for obesity, bipolar d/o, uncontrolled insulin dependent DM. She states that this morning she woke up at 10AM with an acute onset of sharp, stabbing, non-radiating left sided chest pain. She states also feels SOB and it hurts to take deep breaths. Nothing makes it better. She has not tried anything OTC. She has never had this before. She does not smoke. She is unsure of sick contacts. She also reports generalized malaise, productive cough, and nasal congestion starting today. No fever, abdominal pain, N/V. She reports bilateral leg swelling and calf pain. She reports a hx of DVT in her leg as a child. No recent surgery/travel/immobilization, hx of cancer, hemptysis, or hormone use.   HPI  Past Medical History:  Diagnosis Date  . Anxiety   . Asthma   . Bipolar 1 disorder ([place])   . Cancer of abdominal wall   . Depression   . Diabetes mellitus without complication ([place])   . High cholesterol   . Hypertension   . Obesity   . Obesity   . Polycystic ovarian syndrome 07/01/2011   Patient report  . 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 Schizophrenia ([place])   . Seizures [facility])     Patient Active Problem List   Diagnosis Date Noted  . Suicide attempt by other psychotropic drug overdose ([place])   . DKA (diabetic ketoacidoses) ([place]) 03/14/2017  . Chronic constipation   . Seizures ([place]) 12/11/2016  . DM2 (diabetes mellitus, type 2) ([place]) 12/11/2016  . Uncontrolled diabetes mellitus ([place]) 11/03/2016  . Schizoaffective disorder, bipolar type ([place]) 11/02/2016  . Cluster B personality disorder ([place]) 11/02/2016  . Suicidal ideation   . 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 HTN (hypertension) 04/15/2013  .  Post traumatic stress disorder 12/07/2011  . PSVT (paroxysmal supraventricular tachycardia) ([place]) 08/26/2011  . ADHD 09/23/2007  . EPIGASTRIC PAIN 09/23/2007  . Obesity, unspecified 07/30/2007  . Depression 07/30/2007  . SLEEP DISORDER 07/30/2007  . METRORRHAGIA 06/13/2006  . DISORDER, MENSTRUAL NEC 06/13/2006  . POLYCYSTIC OVARIAN DISEASE 04/25/2006  . AMENORRHEA, SECONDARY 04/20/2006  . ACNE, MILD 04/20/2006    Past Surgical History:  Procedure Laterality Date  . CHOLECYSTECTOMY    . COLONOSCOPY WITH PROPOFOL N/A 03/08/2017   Procedure: COLONOSCOPY WITH PROPOFOL;  Surgeon: Milus Banister, MD;  Location[facility];  Service: Endoscopy;  Laterality: N/A;  . HERNIA REPAIR    . Ovarian Cyst Excision    . VARICOSE VEIN SURGERY       OB History    Gravida  0   Para      Term      Preterm      AB      Living        SAB      TAB      Ectopic      Multiple      Live Births               Home Medications    Prior to Admission medications   Medication Sig Start Date End Date Taking? Authorizing Provider  busPIRone (BUSPAR) 30 MG tablet Take 45 mg by mouth 3 (three) times daily.  [provider]  chlorproMAZINE (THORAZINE) 50 MG tablet Take 1 tablet (50 mg total) by mouth 3 (three) times daily. For agitation/mood control 02/09/17   Lindell Spar I, NP  dicyclomine (BENTYL) 20 MG tablet Take 1 tablet (20 mg total) by mouth 3 (three) times daily with meals as needed for spasms (abdominal pain and/or diarrhea). Patient not taking: Reported on 07/12/2017 05/26/17   Street, [place], PA-C  gabapentin (NEURONTIN) 400 MG capsule Take 1 capsule (400 mg total) by mouth 3 (three) times daily. For agitation/diabetic neuropathy 02/09/17   Lindell Spar I, NP  insulin aspart (NOVOLOG) 100 UNIT/ML injection Inject 6 Units into the skin 3 (three) times daily with meals. For diabetes management 07/17/17   Montine Circle, PA-C  insulin detemir (LEVEMIR) 100 UNIT/ML  injection Inject 0.68 mLs (68 Units total) into the skin 2 (two) times daily. For diabetes management 07/17/17   [place] Masker B, PA-C  Insulin Syringe-Needle U-100 25G X 1" 1 ML MISC 1 Syringe by Does not apply route 4 (four) times daily -  before meals and at bedtime. 07/17/17   [place]ston Masker B, PA-C  levETIRAcetam (KEPPRA) 500 MG tablet Take 1 tablet (500 mg total) by mouth 2 (two) times daily. For mood stabilization 02/09/17   Lindell Spar I, NP  lisinopril (PRINIVIL,ZESTRIL) 10 MG tablet Take 1 tablet (10 mg total) by mouth daily. For high blood pressure 02/09/17   [place], Herbert Pun I, NP  Melatonin 5 MG TABS Take 5 mg by mouth at bedtime.    [provider]  QUEtiapine (SEROQUEL XR) 200 MG 24 hr tablet Take 500 mg by mouth at bedtime.     [provider]  QUEtiapine (SEROQUEL) 100 MG tablet Take 5 tablets (500 mg total) by mouth at bedtime. For mood control Patient taking differently: Take 100 mg by mouth daily.  02/09/17   Lindell Spar I, NP  sertraline (ZOLOFT) 100 MG tablet Take 200 mg by mouth daily.    [provider]  simvastatin (ZOCOR) 10 MG tablet Take 10 mg by mouth at bedtime.    [provider]  topiramate (TOPAMAX) 100 MG tablet Take 100 mg by mouth 2 (two) times daily.    [provider]  traZODone (DESYREL) 50 MG tablet Take 50 mg by mouth at bedtime.    [provider]    Family History Family History  Problem Relation Age of Onset  . Coronary artery disease Maternal Grandmother   . Diabetes type II Maternal Grandmother   . Cancer Maternal Grandmother   . Hypertension Mother   . Hypertension Father     Social History Social History   Tobacco Use  . Smoking status: Never Smoker  . Smokeless tobacco: Never Used  Substance Use Topics  . Alcohol use: No  . Drug use: No     Allergies   Fish-derived products; Geodon [ziprasidone hcl]; Haldol [haloperidol lactate]; Buprenorphine hcl; Compazine [prochlorperazine];  Morphine and related; and Toradol [ketorolac tromethamine]   Review of Systems Review of Systems  Constitutional: Positive for appetite change. Negative for fever.  HENT: Positive for congestion.   Respiratory: Positive for cough and shortness of breath. Negative for wheezing.   Cardiovascular: Positive for chest pain and leg swelling.  Gastrointestinal: Negative for abdominal pain, nausea and vomiting.  All other systems reviewed and are negative.    Physical Exam Updated Vital Signs BP (!) 145/89 (BP Location: Right Arm)   Pulse 91   Temp 98.7 F (37.1 C) (Oral)  Resp 20   LMP 07/02/2017   SpO2 96%   Physical Exam  Constitutional: She is oriented to person, place, and time. She appears well-developed and well-nourished. No distress.  Obese, calm. Blunt affect. Sitting in stretcher with her laptop  HENT:  Head: Normocephalic and atraumatic.  Right Ear: Tympanic membrane normal.  Left Ear: Tympanic membrane normal.  Nose: Rhinorrhea present.  Mouth/Throat: Uvula is midline, oropharynx is clear and moist and mucous membranes are normal.  Eyes: Pupils are equal, round, and reactive to light. Conjunctivae are normal. Right eye exhibits no discharge. Left eye exhibits no discharge. No scleral icterus.  Neck: Normal range of motion.  Cardiovascular: Normal rate and regular rhythm.  Pulmonary/Chest: Effort normal and breath sounds normal. No respiratory distress.  Abdominal: Soft. Bowel sounds are normal. She exhibits no distension. There is no tenderness.  Musculoskeletal:  No peripheral edema  Neurological: She is alert and oriented to person, place, and time.  Skin: Skin is warm and dry.  Psychiatric: She has a normal mood and affect. Her behavior is normal.  Nursing note and vitals reviewed.    ED Treatments / Results  Labs (all labs ordered are listed, but only abnormal results are displayed) Labs Reviewed  CBC - Abnormal; Notable for the following components:       Result Value   WBC 3.8 (*)    Hemoglobin 10.9 (*)    HCT 35.8 (*)    MCH 25.1 (*)    Platelets 100 (*)    All other components within normal limits  COMPREHENSIVE METABOLIC PANEL - Abnormal; Notable for the following components:   Sodium 133 (*)    Glucose, Bld 448 (*)    BUN <5 (*)    Calcium 8.3 (*)    Albumin 3.2 (*)    All other components within normal limits  CBG MONITORING, ED - Abnormal; Notable for the following components:   Glucose-Capillary 464 (*)    All other components within normal limits  D-DIMER, QUANTITATIVE (NOT AT [facility])  I-STAT TROPONIN, ED  I-STAT BETA HCG BLOOD, ED ([facility], [facility], [facility] ONLY)    EKG None  Radiology Dg Chest 2 View  Result Date: 07/21/2017 CLINICAL DATA:  Patient reports cough X 2 months. EXAM: CHEST - 2 VIEW COMPARISON:  04/11/2017 FINDINGS: Lungs are clear. Heart size and mediastinal contours are within normal limits. Blunting of posterior costophrenic angle since prior study suggesting tiny pleural effusions. No pneumothorax. Visualized bones unremarkable. IMPRESSION: Probable new tiny pleural effusions.  Otherwise negative. Electronically Signed   By: Lucrezia Europe M.D.   On: 07/21/2017 14:51    Procedures Procedures (including critical care time)  Medications Ordered in ED Medications  sodium chloride 0.9 % bolus 1,000 mL (has no administration in time range)  ibuprofen (ADVIL,MOTRIN) tablet 600 mg (600 mg Oral Given 07/21/17 1659)  benzonatate (TESSALON) capsule 100 mg (100 mg Oral Given 07/21/17 1700)     Initial Impression / Assessment and Plan / ED Course  I have reviewed the triage vital signs and the nursing notes.  Pertinent labs & imaging results that were available during my care of the patient were reviewed by me and considered in my medical decision making (see chart for details).  32 year old female presents with chest pain and SOB. Chest pain work up is reassuring. Doubt ACS, PE, pericarditis, esophageal rupture, tension  pneumothorax, aortic dissection, cardiac tamponade. EKG is NSR. CXR show possible small pleural effusions. Initial troponin is 0. Labs are remarkable  for hyperglycemia which is typical for her. There is also a drop in her hgb from four days ago. Will add D-dimer since she reports hx of DVT although I'm unsure if this is accurate but she is an unreliable historian.    5:15 PM I was informed by on of the NT that the patient had left AMA/eloped. D-dimer is negative.   Final Clinical Impressions(s) / ED Diagnoses   Final diagnoses:  Chest pain, unspecified type    ED Discharge Orders    None       Recardo Evangelist, PA-C 07/21/17 1718    Julianne Rice, MD 07/24/17 [phone]

## 2017-07-24 ENCOUNTER — Emergency Department (HOSPITAL_COMMUNITY)
Admission: EM | Admit: 2017-07-24 | Discharge: 2017-07-25 | Disposition: A | Discharge status: Home / Self Care | Payer: Medicare HMO | Source: Home / Self Care | Attending: Emergency Medicine | Admitting: Emergency Medicine

## 2017-07-24 ENCOUNTER — Encounter (HOSPITAL_COMMUNITY): Payer: Self-pay | Admitting: Emergency Medicine

## 2017-07-24 DIAGNOSIS — Z79899 Other long term (current) drug therapy: Secondary | ICD-10-CM

## 2017-07-24 DIAGNOSIS — E119 Type 2 diabetes mellitus without complications: Secondary | ICD-10-CM | POA: Insufficient documentation

## 2017-07-24 DIAGNOSIS — Z794 Long term (current) use of insulin: Secondary | ICD-10-CM | POA: Insufficient documentation

## 2017-07-24 DIAGNOSIS — I10 Essential (primary) hypertension: Secondary | ICD-10-CM

## 2017-07-24 DIAGNOSIS — N949 Unspecified condition associated with female genital organs and menstrual cycle: Secondary | ICD-10-CM | POA: Diagnosis not present

## 2017-07-24 DIAGNOSIS — T43292A Poisoning by other antidepressants, intentional self-harm, initial encounter: Secondary | ICD-10-CM | POA: Diagnosis not present

## 2017-07-24 DIAGNOSIS — A630 Anogenital (venereal) warts: Secondary | ICD-10-CM | POA: Insufficient documentation

## 2017-07-24 DIAGNOSIS — R102 Pelvic and perineal pain: Secondary | ICD-10-CM | POA: Diagnosis not present

## 2017-07-24 DIAGNOSIS — J45909 Unspecified asthma, uncomplicated: Secondary | ICD-10-CM | POA: Insufficient documentation

## 2017-07-24 DIAGNOSIS — T1491XA Suicide attempt, initial encounter: Secondary | ICD-10-CM | POA: Diagnosis not present

## 2017-07-24 NOTE — ED Triage Notes (Signed)
Pt reports she has "blisters" in her "private area" that bleeds when she scratches them. Pt denies hx of genital herpes, states she has not had sex in 9mo

## 2017-07-25 NOTE — ED Notes (Signed)
Patient left prior to receiving discharge paperwork.

## 2017-07-25 NOTE — ED Notes (Signed)
ED Provider at bedside. Chaperone assisted for exam.

## 2017-07-25 NOTE — ED Provider Notes (Signed)
[facility] EMERGENCY DEPARTMENT Provider Note   CSN: [id number] Arrival date & time: 07/24/17  2254     History   Chief Complaint Chief Complaint  Patient presents with  . Vaginal Pain    HPI Tina Saunders is a 32 y.o. female.  Patient presents to the emergency department with a chief complaint of vaginal pain.  She states that she has noticed bumps around her vagina.  She denies being sexually active for the past 7 months.  She denies any vaginal discharge or bleeding.  Denies any dysuria.  Denies any other associated symptoms.  The history is provided by the patient. No language interpreter was used.    Past Medical History:  Diagnosis Date  . Anxiety   . Asthma   . Bipolar 1 disorder ([place])   . Cancer of abdominal wall   . Depression   . Diabetes mellitus without complication ([place])   . High cholesterol   . Hypertension   . Obesity   . Obesity   . Polycystic ovarian syndrome 07/01/2011   Patient report  . 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 Schizophrenia ([place])   . Seizures [facility])     Patient Active Problem List   Diagnosis Date Noted  . Suicide attempt by other psychotropic drug overdose ([place])   . DKA (diabetic ketoacidoses) ([place]) 03/14/2017  . Chronic constipation   . Seizures ([place]) 12/11/2016  . DM2 (diabetes mellitus, type 2) ([place]) 12/11/2016  . Uncontrolled diabetes mellitus ([place]) 11/03/2016  . Schizoaffective disorder, bipolar type ([place]) 11/02/2016  . Cluster B personality disorder ([place]) 11/02/2016  . Suicidal ideation   . 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 HTN (hypertension) 04/15/2013  . Post traumatic stress disorder 12/07/2011  . PSVT (paroxysmal supraventricular tachycardia) ([place]) 08/26/2011  . ADHD 09/23/2007  . EPIGASTRIC PAIN 09/23/2007  . Obesity, unspecified 07/30/2007  . Depression 07/30/2007  . SLEEP DISORDER 07/30/2007  . METRORRHAGIA 06/13/2006  . DISORDER, MENSTRUAL NEC 06/13/2006  . POLYCYSTIC OVARIAN DISEASE 04/25/2006    . AMENORRHEA, SECONDARY 04/20/2006  . ACNE, MILD 04/20/2006    Past Surgical History:  Procedure Laterality Date  . CHOLECYSTECTOMY    . COLONOSCOPY WITH PROPOFOL N/A 03/08/2017   Procedure: COLONOSCOPY WITH PROPOFOL;  Surgeon: Milus Banister, MD;  Location: [facility] ENDOSCOPY;  Service: Endoscopy;  Laterality: N/A;  . HERNIA REPAIR    . Ovarian Cyst Excision    . VARICOSE VEIN SURGERY       OB History    Gravida  0   Para      Term      Preterm      AB      Living        SAB      TAB      Ectopic      Multiple      Live Births               Home Medications    Prior to Admission medications   Medication Sig Start Date End Date Taking? Authorizing Provider  busPIRone (BUSPAR) 30 MG tablet Take 45 mg by mouth 3 (three) times daily.     [provider]  chlorproMAZINE (THORAZINE) 50 MG tablet Take 1 tablet (50 mg total) by mouth 3 (three) times daily. For agitation/mood control 02/09/17   Lindell Spar I, NP  dicyclomine (BENTYL) 20 MG tablet Take 1 tablet (20 mg total) by mouth 3 (three) times daily with meals as needed  for spasms (abdominal pain and/or diarrhea). Patient not taking: Reported on 07/12/2017 05/26/17   Street, Troy, PA-C  gabapentin (NEURONTIN) 400 MG capsule Take 1 capsule (400 mg total) by mouth 3 (three) times daily. For agitation/diabetic neuropathy 02/09/17   Lindell Spar I, NP  insulin aspart (NOVOLOG) 100 UNIT/ML injection Inject 6 Units into the skin 3 (three) times daily with meals. For diabetes management 07/17/17   Montine Circle, PA-C  insulin detemir (LEVEMIR) 100 UNIT/ML injection Inject 0.68 mLs (68 Units total) into the skin 2 (two) times daily. For diabetes management 07/17/17   Langston Masker B, PA-C  Insulin Syringe-Needle U-100 25G X 1" 1 ML MISC 1 Syringe by Does not apply route 4 (four) times daily -  before meals and at bedtime. 07/17/17   Langston Masker B, PA-C  levETIRAcetam (KEPPRA) 500 MG tablet Take 1 tablet (500 mg  total) by mouth 2 (two) times daily. For mood stabilization 02/09/17   Lindell Spar I, NP  lisinopril (PRINIVIL,ZESTRIL) 10 MG tablet Take 1 tablet (10 mg total) by mouth daily. For high blood pressure 02/09/17   Nwoko, Herbert Pun I, NP  Melatonin 5 MG TABS Take 5 mg by mouth at bedtime.    [provider]  QUEtiapine (SEROQUEL XR) 200 MG 24 hr tablet Take 500 mg by mouth at bedtime.     [provider]  QUEtiapine (SEROQUEL) 100 MG tablet Take 5 tablets (500 mg total) by mouth at bedtime. For mood control Patient taking differently: Take 100 mg by mouth daily.  02/09/17   Lindell Spar I, NP  sertraline (ZOLOFT) 100 MG tablet Take 200 mg by mouth daily.    [provider]  simvastatin (ZOCOR) 10 MG tablet Take 10 mg by mouth at bedtime.    [provider]  topiramate (TOPAMAX) 100 MG tablet Take 100 mg by mouth 2 (two) times daily.    [provider]  traZODone (DESYREL) 50 MG tablet Take 50 mg by mouth at bedtime.    [provider]    Family History Family History  Problem Relation Age of Onset  . Coronary artery disease Maternal Grandmother   . Diabetes type II Maternal Grandmother   . Cancer Maternal Grandmother   . Hypertension Mother   . Hypertension Father     Social History Social History   Tobacco Use  . Smoking status: Never Smoker  . Smokeless tobacco: Never Used  Substance Use Topics  . Alcohol use: No  . Drug use: No     Allergies   Fish-derived products; Geodon [ziprasidone hcl]; Haldol [haloperidol lactate]; Buprenorphine hcl; Compazine [prochlorperazine]; Morphine and related; and Toradol [ketorolac tromethamine]   Review of Systems Review of Systems  All other systems reviewed and are negative.    Physical Exam Updated Vital Signs BP (!) 145/94 (BP Location: Right Arm)   Pulse 84   Temp 99.3 F (37.4 C) (Oral)   Resp 16   Ht 5\' 4"  (1.626 m)   Wt 102.5 kg (226 lb)   LMP 07/02/2017   SpO2 100%   BMI  38.79 kg/m   Physical Exam  Constitutional: She is oriented to person, place, and time. No distress.  HENT:  Head: Normocephalic and atraumatic.  Eyes: Pupils are equal, round, and reactive to light. Conjunctivae and EOM are normal.  Neck: No tracheal deviation present.  Cardiovascular: Normal rate.  Pulmonary/Chest: Effort normal. No respiratory distress.  Abdominal: Soft.  Genitourinary:  Genitourinary Comments: anogenital warts  Musculoskeletal: Normal range  of motion.  Neurological: She is alert and oriented to person, place, and time.  Skin: Skin is warm and dry. She is not diaphoretic.  Psychiatric: Judgment normal.  Nursing note and vitals reviewed.    ED Treatments / Results  Labs (all labs ordered are listed, but only abnormal results are displayed) Labs Reviewed - No data to display  EKG None  Radiology No results found.  Procedures Procedures (including critical care time)  Medications Ordered in ED Medications - No data to display   Initial Impression / Assessment and Plan / ED Course  I have reviewed the triage vital signs and the nursing notes.  Pertinent labs & imaging results that were available during my care of the patient were reviewed by me and considered in my medical decision making (see chart for details).     Patient with genital warts, recommend PCP/OB/GYN follow-up.  Final Clinical Impressions(s) / ED Diagnoses   Final diagnoses:  Genital warts    ED Discharge Orders    None       Montine Circle, PA-C 07/25/17 0257    Merryl Hacker, MD 07/25/17 [phone]

## 2017-07-26 ENCOUNTER — Other Ambulatory Visit: Payer: Self-pay

## 2017-07-26 ENCOUNTER — Encounter (HOSPITAL_COMMUNITY): Payer: Self-pay | Admitting: Pharmacy Technician

## 2017-07-26 ENCOUNTER — Inpatient Hospital Stay (HOSPITAL_COMMUNITY)
Admission: EM | Admit: 2017-07-26 | Discharge: 2017-07-30 | DRG: 918 | Disposition: A | Discharge status: Home / Self Care | Payer: Medicare HMO | Attending: Family Medicine | Admitting: Family Medicine

## 2017-07-26 DIAGNOSIS — I10 Essential (primary) hypertension: Secondary | ICD-10-CM | POA: Diagnosis present

## 2017-07-26 DIAGNOSIS — H5461 Unqualified visual loss, right eye, normal vision left eye: Secondary | ICD-10-CM | POA: Diagnosis present

## 2017-07-26 DIAGNOSIS — J45909 Unspecified asthma, uncomplicated: Secondary | ICD-10-CM | POA: Diagnosis present

## 2017-07-26 DIAGNOSIS — Z736 Limitation of activities due to disability: Secondary | ICD-10-CM | POA: Diagnosis not present

## 2017-07-26 DIAGNOSIS — Z6837 Body mass index (BMI) 37.0-37.9, adult: Secondary | ICD-10-CM

## 2017-07-26 DIAGNOSIS — R440 Auditory hallucinations: Secondary | ICD-10-CM | POA: Diagnosis not present

## 2017-07-26 DIAGNOSIS — F4310 Post-traumatic stress disorder, unspecified: Secondary | ICD-10-CM | POA: Diagnosis present

## 2017-07-26 DIAGNOSIS — E669 Obesity, unspecified: Secondary | ICD-10-CM | POA: Diagnosis present

## 2017-07-26 DIAGNOSIS — R9431 Abnormal electrocardiogram [ECG] [EKG]: Secondary | ICD-10-CM | POA: Diagnosis not present

## 2017-07-26 DIAGNOSIS — T43201A Poisoning by unspecified antidepressants, accidental (unintentional), initial encounter: Secondary | ICD-10-CM | POA: Diagnosis present

## 2017-07-26 DIAGNOSIS — T887XXA Unspecified adverse effect of drug or medicament, initial encounter: Secondary | ICD-10-CM | POA: Diagnosis not present

## 2017-07-26 DIAGNOSIS — E7800 Pure hypercholesterolemia, unspecified: Secondary | ICD-10-CM | POA: Diagnosis present

## 2017-07-26 DIAGNOSIS — E1165 Type 2 diabetes mellitus with hyperglycemia: Secondary | ICD-10-CM | POA: Diagnosis not present

## 2017-07-26 DIAGNOSIS — R400 Somnolence: Secondary | ICD-10-CM | POA: Diagnosis not present

## 2017-07-26 DIAGNOSIS — Z794 Long term (current) use of insulin: Secondary | ICD-10-CM | POA: Diagnosis not present

## 2017-07-26 DIAGNOSIS — T1491XA Suicide attempt, initial encounter: Secondary | ICD-10-CM

## 2017-07-26 DIAGNOSIS — Z888 Allergy status to other drugs, medicaments and biological substances status: Secondary | ICD-10-CM | POA: Diagnosis not present

## 2017-07-26 DIAGNOSIS — R000 Tachycardia, unspecified: Secondary | ICD-10-CM | POA: Diagnosis not present

## 2017-07-26 DIAGNOSIS — Z91018 Allergy to other foods: Secondary | ICD-10-CM | POA: Diagnosis not present

## 2017-07-26 DIAGNOSIS — T43292A Poisoning by other antidepressants, intentional self-harm, initial encounter: Secondary | ICD-10-CM | POA: Diagnosis not present

## 2017-07-26 DIAGNOSIS — Z79899 Other long term (current) drug therapy: Secondary | ICD-10-CM | POA: Diagnosis not present

## 2017-07-26 DIAGNOSIS — D61818 Other pancytopenia: Secondary | ICD-10-CM | POA: Diagnosis present

## 2017-07-26 DIAGNOSIS — T43202A Poisoning by unspecified antidepressants, intentional self-harm, initial encounter: Secondary | ICD-10-CM

## 2017-07-26 DIAGNOSIS — Z560 Unemployment, unspecified: Secondary | ICD-10-CM | POA: Diagnosis not present

## 2017-07-26 DIAGNOSIS — F250 Schizoaffective disorder, bipolar type: Secondary | ICD-10-CM | POA: Diagnosis not present

## 2017-07-26 DIAGNOSIS — F29 Unspecified psychosis not due to a substance or known physiological condition: Secondary | ICD-10-CM | POA: Diagnosis not present

## 2017-07-26 DIAGNOSIS — Z79890 Hormone replacement therapy (postmenopausal): Secondary | ICD-10-CM | POA: Diagnosis not present

## 2017-07-26 DIAGNOSIS — T50901A Poisoning by unspecified drugs, medicaments and biological substances, accidental (unintentional), initial encounter: Secondary | ICD-10-CM | POA: Diagnosis not present

## 2017-07-26 DIAGNOSIS — R441 Visual hallucinations: Secondary | ICD-10-CM | POA: Diagnosis not present

## 2017-07-26 DIAGNOSIS — F909 Attention-deficit hyperactivity disorder, unspecified type: Secondary | ICD-10-CM | POA: Diagnosis not present

## 2017-07-26 DIAGNOSIS — E785 Hyperlipidemia, unspecified: Secondary | ICD-10-CM | POA: Diagnosis present

## 2017-07-26 DIAGNOSIS — E871 Hypo-osmolality and hyponatremia: Secondary | ICD-10-CM | POA: Diagnosis present

## 2017-07-26 DIAGNOSIS — F4325 Adjustment disorder with mixed disturbance of emotions and conduct: Secondary | ICD-10-CM | POA: Diagnosis present

## 2017-07-26 DIAGNOSIS — R45851 Suicidal ideations: Secondary | ICD-10-CM | POA: Diagnosis not present

## 2017-07-26 DIAGNOSIS — T50902A Poisoning by unspecified drugs, medicaments and biological substances, intentional self-harm, initial encounter: Secondary | ICD-10-CM

## 2017-07-26 DIAGNOSIS — Z885 Allergy status to narcotic agent status: Secondary | ICD-10-CM | POA: Diagnosis not present

## 2017-07-26 HISTORY — DX: Poisoning by unspecified drugs, medicaments and biological substances, intentional self-harm, initial encounter: T50.902A

## 2017-07-26 LAB — COMPREHENSIVE METABOLIC PANEL
ALBUMIN: 3.9 g/dL (ref 3.5–5.0)
ALK PHOS: 106 U/L (ref 38–126)
ALT: 25 U/L (ref 0–44)
AST: 45 U/L — AB (ref 15–41)
Anion gap: 15 (ref 5–15)
CO2: 22 mmol/L (ref 22–32)
CREATININE: 0.51 mg/dL (ref 0.44–1.00)
Calcium: 9.2 mg/dL (ref 8.9–10.3)
Chloride: 95 mmol/L — ABNORMAL LOW (ref 98–111)
GFR calc non Af Amer: >60 mL/min (ref >60)
GLUCOSE: 428 mg/dL — AB (ref 70–99)
Potassium: 3.7 mmol/L (ref 3.5–5.1)
SODIUM: 132 mmol/L — AB (ref 135–145)
Total Bilirubin: 0.7 mg/dL (ref 0.3–1.2)
Total Protein: 7.7 g/dL (ref 6.5–8.1)

## 2017-07-26 LAB — CBC WITH DIFFERENTIAL/PLATELET
Abs Immature Granulocytes: 0 10*3/uL (ref 0.0–0.1)
Basophils Absolute: 0 10*3/uL (ref 0.0–0.1)
Basophils Relative: 0 %
Eosinophils Absolute: 0.1 10*3/uL (ref 0.0–0.7)
Eosinophils Relative: 3 %
HCT: 39.4 % (ref 36.0–46.0)
HEMOGLOBIN: 12.2 g/dL (ref 12.0–15.0)
IMMATURE GRANULOCYTES: 0 %
LYMPHS ABS: 1.2 10*3/uL (ref 0.7–4.0)
Lymphocytes Relative: 23 %
MCH: 25.1 pg — AB (ref 26.0–34.0)
MCHC: 31 g/dL (ref 30.0–36.0)
MCV: 80.9 fL (ref 78.0–100.0)
MONOS PCT: 5 %
Monocytes Absolute: 0.2 10*3/uL (ref 0.1–1.0)
NEUTROS ABS: 3.5 10*3/uL (ref 1.7–7.7)
NEUTROS PCT: 69 %
PLATELETS: 124 10*3/uL — AB (ref 150–400)
RBC: 4.87 MIL/uL (ref 3.87–5.11)
RDW: 15.3 % (ref 11.5–15.5)
WBC: 5.1 10*3/uL (ref 4.0–10.5)

## 2017-07-26 LAB — RAPID URINE DRUG SCREEN, HOSP PERFORMED
Amphetamines: NOT DETECTED
Benzodiazepines: NOT DETECTED
Cocaine: NOT DETECTED
OPIATES: NOT DETECTED
Tetrahydrocannabinol: NOT DETECTED

## 2017-07-26 LAB — I-STAT BETA HCG BLOOD, ED (MC, WL, AP ONLY)

## 2017-07-26 LAB — ETHANOL: Alcohol, Ethyl (B): <10 mg/dL (ref <10)

## 2017-07-26 LAB — SALICYLATE LEVEL: Salicylate Lvl: <7 mg/dL (ref 2.8–30.0)

## 2017-07-26 LAB — HEMOGLOBIN A1C
Hgb A1c MFr Bld: 12.4 % — ABNORMAL HIGH (ref 4.8–5.6)
MEAN PLASMA GLUCOSE: 309.18 mg/dL

## 2017-07-26 LAB — MAGNESIUM: Magnesium: 1.6 mg/dL — ABNORMAL LOW (ref 1.7–2.4)

## 2017-07-26 LAB — GLUCOSE, CAPILLARY: Glucose-Capillary: 447 mg/dL — ABNORMAL HIGH (ref 70–99)

## 2017-07-26 LAB — ACETAMINOPHEN LEVEL: Acetaminophen (Tylenol), Serum: <10 ug/mL — ABNORMAL LOW (ref 10–30)

## 2017-07-26 MED ORDER — LORAZEPAM 2 MG/ML IJ SOLN
1.0000 mg | Freq: Once | INTRAMUSCULAR | Status: AC
  Administered 2017-07-27: 1 mg via INTRAVENOUS
  Filled 2017-07-26: qty 1

## 2017-07-26 MED ORDER — SODIUM CHLORIDE 0.9 % IV SOLN
INTRAVENOUS | Status: DC
  Administered 2017-07-26 – 2017-07-28 (×4): via INTRAVENOUS
  Administered 2017-07-29: 125 mL via INTRAVENOUS
  Administered 2017-07-29: 03:00:00 via INTRAVENOUS

## 2017-07-26 MED ORDER — SERTRALINE HCL 100 MG PO TABS
200.0000 mg | ORAL_TABLET | Freq: Every day | ORAL | Status: DC
  Administered 2017-07-27: 200 mg via ORAL
  Filled 2017-07-26: qty 2

## 2017-07-26 MED ORDER — LORAZEPAM 2 MG/ML IJ SOLN
1.0000 mg | Freq: Once | INTRAMUSCULAR | Status: AC
  Administered 2017-07-26: 1 mg via INTRAVENOUS

## 2017-07-26 MED ORDER — MELATONIN 3 MG PO TABS
4.5000 mg | ORAL_TABLET | Freq: Every day | ORAL | Status: DC
  Administered 2017-07-27 (×2): 4.5 mg via ORAL
  Administered 2017-07-28: 4 mg via ORAL
  Administered 2017-07-29: 4.5 mg via ORAL
  Filled 2017-07-26 (×5): qty 1.5

## 2017-07-26 MED ORDER — CHLORPROMAZINE HCL 25 MG PO TABS
50.0000 mg | ORAL_TABLET | Freq: Three times a day (TID) | ORAL | Status: DC
  Administered 2017-07-27 – 2017-07-30 (×10): 50 mg via ORAL
  Filled 2017-07-26 (×14): qty 2

## 2017-07-26 MED ORDER — LORAZEPAM 2 MG/ML IJ SOLN
INTRAMUSCULAR | Status: AC
  Filled 2017-07-26: qty 1

## 2017-07-26 MED ORDER — INSULIN ASPART 100 UNIT/ML ~~LOC~~ SOLN: 0.0000 [IU] | Freq: Three times a day (TID) | SUBCUTANEOUS | Status: DC

## 2017-07-26 MED ORDER — INSULIN ASPART 100 UNIT/ML ~~LOC~~ SOLN
15.0000 [IU] | Freq: Once | SUBCUTANEOUS | Status: AC
  Administered 2017-07-26: 15 [IU] via SUBCUTANEOUS

## 2017-07-26 MED ORDER — TRAZODONE HCL 50 MG PO TABS: 50.0000 mg | ORAL_TABLET | Freq: Every evening | ORAL | Status: DC | PRN

## 2017-07-26 MED ORDER — SIMVASTATIN 10 MG PO TABS: 10.0000 mg | ORAL_TABLET | Freq: Every day | ORAL | Status: DC

## 2017-07-26 MED ORDER — LISINOPRIL 10 MG PO TABS
10.0000 mg | ORAL_TABLET | Freq: Every day | ORAL | Status: DC
  Administered 2017-07-27 – 2017-07-30 (×4): 10 mg via ORAL
  Filled 2017-07-26 (×4): qty 1

## 2017-07-26 MED ORDER — LEVETIRACETAM 500 MG PO TABS
500.0000 mg | ORAL_TABLET | Freq: Two times a day (BID) | ORAL | Status: DC
  Administered 2017-07-27 – 2017-07-30 (×8): 500 mg via ORAL
  Filled 2017-07-26 (×8): qty 1

## 2017-07-26 NOTE — ED Notes (Signed)
Admitting MD at bedside.

## 2017-07-26 NOTE — ED Triage Notes (Signed)
Pt arrives ems with reports of intentional overdose of seroquel and buspar. Pt reports hallucinations. BP 118/72, HR 100-110. CBG 435.

## 2017-07-26 NOTE — H&P (Addendum)
[facility] Admission History and Physical Service Pager: [phone]  Patient name: Tina Saunders Medical record number: [id number] Date of birth: Mar 08, 1985 Age: 32 y.o. Gender: female  Primary Care Provider: Javier Docker, MD Consultants: Psych, Poison Control Code Status: full  Chief Complaint: ingestion  Assessment and Plan: Tina Saunders is a 32 y.o. female presenting with AMS after intentional overdose of Seroquel ER and buspirone. PMH is significant for prior suicide attempts, schizophrenia, bipolar disorder, and poorly controlled T2DM.  Intentional overdose: She reports hearing a voice telling her to take all of her pills. She took 2-3 pills of Seroquel and 20 pills of buspirone today and reports previous attempts at overdosing.  Vitals significant for tachycardia up to 125, blood pressure elevated to 178/91, likely consistent with anxiety.  Tylenol, ethanol, salicylate levels wnl.  UDS pending.  Per poison control, 8-12 hours of cardiac monitoring is warranted along with EKGs Q4H and frequent neuro checks. EKG on admission showed mildly elevated QT at 484 but was otherwise unremarkable. - Admitted to telemetry, attending Dr. Ardelia Mems - EKGs q4H. Monitor QT interval. - Neuro checks q4 hours for A&Os - Suicide precautions: IVC with sitter - Psychiatry consult - Hold Seroquel and Buspirone - Continue Chlorpromazine 50mg  PO TID, first dose 7/5 in AM; can also consider holding this medication if deemed appropriate - hold home gabapentin and topomax due to risk for increased sedation - Continue Keppra 500mg  BID for mood stabilization - Continue Zoloft 200mg  PO - Trazodone 50mg  qHS PRN sleep  T2DM: Takes Levemir and Novolog at home, although she did not take these today. Her blood sugars normally run in the 400s. Patient has previous ED visits for hyperglycemia. - Insulin Aspart moderate sliding scale qACHS - Add Levemir as needed for elevated CBG  (don't know if patient will cooperate with eating, don't want to bottom out BG) - IV NS 125cc/hr maintenance - f/u HbA1C  Mild hyponatremia and hypochloremia, likely chronic given past measurements: Patient appears euvolemic.  Na 132, chloride 95. - NS @ 125 ml/hr  Hypertension: BP 151/95 mmHg in the ED.  Takes Lisinopril 10 mg daily at home. - Lisinopril 10mg  PO qDaily  HLD: patient has history of hyperlipidemia.  Last lipid panel with cholesterol 113, HDL 19, LDL 21, TG 366 in October 2018 - Simvastatin 10mg  PO qDaily  FEN/GI: carb-modified diet Prophylaxis: ambulation  Disposition: Telemetry  History of Present Illness:  Tina Saunders is a 32 y.o. female presenting with ingestion of her psych meds. She reports feeling depressed lately. She says she takes her medications regularly, including seroquel for schizophrenia and buspirone for anxiety. She reports feeling "shaky" but denies fevers or chills.  She reports hearing a voice that tells her things, and tonight, this voice told her to take more pills. She denies homicidal ideation, but reports current suicidal ideation with plan to "take all of her medicine." She denies homicidal ideation. She lives alone.  In ED, patient had seizure-like activity with shaking of both hands and feet but responded properly to a question during this period, so this activity was determined to be nonepileptiform. Poison Control was contacted in the ED, recommending 8-12 hours of cardiac monitoring with EKGs q4 hours and Neuro Checks q4 hours.  Review Of Systems: Per HPI with the following additions:  Review of Systems  Constitutional: Negative for chills and fever.  HENT: Negative for congestion, ear pain, sinus pain and sore throat.   Cardiovascular: Positive for palpitations. Negative  for chest pain.  Gastrointestinal: Positive for diarrhea (chronic). Negative for abdominal pain, constipation and nausea.  Neurological: Positive for loss of  consciousness and headaches (Above right eye).  Psychiatric/Behavioral: Positive for depression, hallucinations and suicidal ideas. The patient is nervous/anxious.     Patient Active Problem List   Diagnosis Date Noted  . Suicide attempt by other psychotropic drug overdose ([place])   . DKA (diabetic ketoacidoses) ([place]) 03/14/2017  . Chronic constipation   . Seizures ([place]) 12/11/2016  . DM2 (diabetes mellitus, type 2) ([place]) 12/11/2016  . Uncontrolled diabetes mellitus ([place]) 11/03/2016  . Schizoaffective disorder, bipolar type ([place]) 11/02/2016  . Cluster B personality disorder ([place]) 11/02/2016  . Suicidal ideation   . 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 HTN (hypertension) 04/15/2013  . Post traumatic stress disorder 12/07/2011  . PSVT (paroxysmal supraventricular tachycardia) ([place]) 08/26/2011  . ADHD 09/23/2007  . EPIGASTRIC PAIN 09/23/2007  . Obesity, unspecified 07/30/2007  . Depression 07/30/2007  . SLEEP DISORDER 07/30/2007  . METRORRHAGIA 06/13/2006  . DISORDER, MENSTRUAL NEC 06/13/2006  . POLYCYSTIC OVARIAN DISEASE 04/25/2006  . AMENORRHEA, SECONDARY 04/20/2006  . ACNE, MILD 04/20/2006    Past Medical History: Past Medical History:  Diagnosis Date  . Anxiety   . Asthma   . Bipolar 1 disorder ([place])   . Cancer of abdominal wall   . Depression   . Diabetes mellitus without complication ([place])   . High cholesterol   . Hypertension   . Obesity   . Obesity   . Polycystic ovarian syndrome 07/01/2011   Patient report  . 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 Schizophrenia ([place])   . Seizures ([place])     Past Surgical History: Past Surgical History:  Procedure Laterality Date  . CHOLECYSTECTOMY    . COLONOSCOPY WITH PROPOFOL N/A 03/08/2017   Procedure: COLONOSCOPY WITH PROPOFOL;  Surgeon: Milus Banister, MD;  Location: [facility];  Service: Endoscopy;  Laterality: N/A;  . HERNIA REPAIR    . Ovarian Cyst Excision    . VARICOSE VEIN SURGERY      Social History: Social History    Tobacco Use  . Smoking status: Never Smoker  . Smokeless tobacco: Never Used  Substance Use Topics  . Alcohol use: No  . Drug use: No   Additional social history:  Please also refer to relevant sections of EMR.  Family History: Family History  Problem Relation Age of Onset  . Coronary artery disease Maternal Grandmother   . Diabetes type II Maternal Grandmother   . Cancer Maternal Grandmother   . Hypertension Mother   . Hypertension Father     Allergies and Medications: Allergies  Allergen Reactions  . Fish-Derived Products Anaphylaxis    Can only eat [place]  . Geodon [Ziprasidone Hcl] Other (See Comments)    Face pulls, cant swallow - Locked Jaw  . Haldol [Haloperidol Lactate] Other (See Comments)    Face pulls, can't swallow - Locked Jaw  . Buprenorphine Hcl Hives, Itching, Rash and Other (See Comments)    GI upset  . Compazine [Prochlorperazine] Other (See Comments)    anxiety and hyperactivity  . Morphine And Related Hives, Itching, Rash and Other (See Comments)    GI upset  . Toradol [Ketorolac Tromethamine] Other (See Comments)    Anxiety and hyperactivity   No current facility-administered medications on file prior to encounter.    Current Outpatient Medications on File Prior to Encounter  Medication Sig Dispense Refill  . busPIRone (BUSPAR) 30 MG tablet Take  45 mg by mouth 3 (three) times daily.     . chlorproMAZINE (THORAZINE) 50 MG tablet Take 1 tablet (50 mg total) by mouth 3 (three) times daily. For agitation/mood control 90 tablet 0  . dicyclomine (BENTYL) 20 MG tablet Take 1 tablet (20 mg total) by mouth 3 (three) times daily with meals as needed for spasms (abdominal pain and/or diarrhea). (Patient not taking: Reported on 07/12/2017) 30 tablet 0  . gabapentin (NEURONTIN) 400 MG capsule Take 1 capsule (400 mg total) by mouth 3 (three) times daily. For agitation/diabetic neuropathy 90 capsule 0  . insulin aspart (NOVOLOG) 100 UNIT/ML injection Inject 6  Units into the skin 3 (three) times daily with meals. For diabetes management 10 mL 0  . insulin detemir (LEVEMIR) 100 UNIT/ML injection Inject 0.68 mLs (68 Units total) into the skin 2 (two) times daily. For diabetes management 20 mL 0  . Insulin Syringe-Needle U-100 25G X 1" 1 ML MISC 1 Syringe by Does not apply route 4 (four) times daily -  before meals and at bedtime. 1 each 0  . levETIRAcetam (KEPPRA) 500 MG tablet Take 1 tablet (500 mg total) by mouth 2 (two) times daily. For mood stabilization 60 tablet 0  . lisinopril (PRINIVIL,ZESTRIL) 10 MG tablet Take 1 tablet (10 mg total) by mouth daily. For high blood pressure 6 tablet 0  . Melatonin 5 MG TABS Take 5 mg by mouth at bedtime.    Marland Kitchen QUEtiapine (SEROQUEL XR) 200 MG 24 hr tablet Take 500 mg by mouth at bedtime.     Marland Kitchen QUEtiapine (SEROQUEL) 100 MG tablet Take 5 tablets (500 mg total) by mouth at bedtime. For mood control (Patient taking differently: Take 100 mg by mouth daily. ) 150 tablet 0  . sertraline (ZOLOFT) 100 MG tablet Take 200 mg by mouth daily.    . simvastatin (ZOCOR) 10 MG tablet Take 10 mg by mouth at bedtime.    . topiramate (TOPAMAX) 100 MG tablet Take 100 mg by mouth 2 (two) times daily.    . traZODone (DESYREL) 50 MG tablet Take 50 mg by mouth at bedtime.      Objective: BP 127/86   Pulse 99   Resp 17   Ht 5\' 4"  (1.626 m)   Wt 226 lb (102.5 kg)   LMP 07/02/2017   SpO2 98%   BMI 38.79 kg/m  Physical Exam  Constitutional: She appears well-developed and well-nourished.  HENT:  Head: Normocephalic and atraumatic.  Eyes:  Patient uncooperative in EOM evaluation  Neck: Normal range of motion. Neck supple.  Cardiovascular: Regular rhythm and normal heart sounds. Tachycardia present. Exam reveals no gallop and no friction rub.  No murmur heard. Tachycardic at 122 bpm  Pulmonary/Chest: Effort normal and breath sounds normal.  Abdominal: Soft. Bowel sounds are normal. She exhibits no distension. There is no  tenderness. There is no guarding.  obese  Musculoskeletal: Normal range of motion. She exhibits no edema.  Neurological: She is alert.  Skin: Skin is warm and dry. Capillary refill takes less than 2 seconds. No rash noted. No erythema.  Psychiatric:  Depressed mood, "antsy" movements, difficulty sitting still, non-aggressive    Labs and Imaging: CBC BMET  Recent Labs  Lab 07/26/17 2009  WBC 5.1  HGB 12.2  HCT 39.4  PLT 124*   Recent Labs  Lab 07/26/17 2009  NA 132*  K 3.7  CL 95*  CO2 22  BUN <5*  CREATININE 0.51  GLUCOSE 428*  CALCIUM 9.2  Milus Banister, [facility], PGY-1 07/26/2017 10:48 PM  FPTS Upper-Level Resident Addendum   I have independently interviewed and examined the patient. I have discussed the above with the original author and agree with their documentation. My edits for correction/addition/clarification are in blue. Please see also any attending notes.    Kathrene Alu, MD PGY-2, [facility] 07/26/2017 11:10 PM  FPTS Service pager: [phone] (text pages welcome through [place])

## 2017-07-26 NOTE — Significant Event (Signed)
Rapid Response Event Note  Overview:Called to room d/t pt shaking,?seizure, and pt nonverbal Time Called: 2315 Arrival Time: 2320 Event Type: Neurologic  Initial Focused Assessment: Pt sitting up in bed with eyes closed.  Pt having tremoring of hands and feet.  Tremors less with distraction.  Pt is alert to self, will answer questions.  T-98.7, HR-134, BP-145/90 , RR-20, SpO2-100% on 2L [place]. PERRLA.  Pt c/o being hot.  Interventions: Dr. Ouida Sills to bedside, 1mg  ativan ordered. Plan of Care (if not transferred): Given ativan. Continue to monitor pt. Call RR RN if further assistance needed. Event Summary: Name of Physician Notified: Dr. Ouida Sills at 2330    at    Outcome: Stayed in room and stabalized  Event End Time: 2345  Dillard [place]

## 2017-07-26 NOTE — ED Notes (Signed)
Pt attempting to get out of bed. Pt will not speak to this nurse or MD at this time.

## 2017-07-26 NOTE — ED Provider Notes (Signed)
[facility] EMERGENCY DEPARTMENT Provider Note   CSN: [id number] Arrival date & time: 07/26/17  1935     History   Chief Complaint Chief Complaint  Patient presents with  . Ingestion    HPI Tina Saunders is a 32 y.o. female.  Level 5 caveat secondary to altered mental status.  Patient was brought in here by police and EMS after an intentional overdose of her Seroquel and buspirone at around 630 this evening.  Patient admitted to them that she took 12+ of her BuSpar and 2 or 3 of her Seroquel and attempt to hurt herself.  She arrives here awake but not answering any questions and unwilling to speak to me.  Reportedly nursing states that she told EMS she was having some hallucinations.  Fingerstick was elevated by EMS.  The history is provided by the patient and the EMS personnel. The history is limited by the condition of the patient.  Ingestion  This is a new problem. The current episode started 1 to 2 hours ago. The problem has been gradually worsening. Nothing aggravates the symptoms. Nothing relieves the symptoms. She has tried nothing for the symptoms. The treatment provided no relief.    Past Medical History:  Diagnosis Date  . Anxiety   . Asthma   . Bipolar 1 disorder ([place])   . Cancer of abdominal wall   . Depression   . Diabetes mellitus without complication ([place])   . High cholesterol   . Hypertension   . Obesity   . Obesity   . Polycystic ovarian syndrome 07/01/2011   Patient report  . 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 Schizophrenia ([place])   . Seizures [facility])     Patient Active Problem List   Diagnosis Date Noted  . Suicide attempt by other psychotropic drug overdose ([place])   . DKA (diabetic ketoacidoses) ([place]) 03/14/2017  . Chronic constipation   . Seizures ([place]) 12/11/2016  . DM2 (diabetes mellitus, type 2) ([place]) 12/11/2016  . Uncontrolled diabetes mellitus ([place]) 11/03/2016  . Schizoaffective disorder, bipolar type ([place]) 11/02/2016  . Cluster B  personality disorder ([place]) 11/02/2016  . Suicidal ideation   . 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 HTN (hypertension) 04/15/2013  . Post traumatic stress disorder 12/07/2011  . PSVT (paroxysmal supraventricular tachycardia) ([place]) 08/26/2011  . ADHD 09/23/2007  . EPIGASTRIC PAIN 09/23/2007  . Obesity, unspecified 07/30/2007  . Depression 07/30/2007  . SLEEP DISORDER 07/30/2007  . METRORRHAGIA 06/13/2006  . DISORDER, MENSTRUAL NEC 06/13/2006  . POLYCYSTIC OVARIAN DISEASE 04/25/2006  . AMENORRHEA, SECONDARY 04/20/2006  . ACNE, MILD 04/20/2006    Past Surgical History:  Procedure Laterality Date  . CHOLECYSTECTOMY    . COLONOSCOPY WITH PROPOFOL N/A 03/08/2017   Procedure: COLONOSCOPY WITH PROPOFOL;  Surgeon: Milus Banister, MD;  Location: [facility] ENDOSCOPY;  Service: Endoscopy;  Laterality: N/A;  . HERNIA REPAIR    . Ovarian Cyst Excision    . VARICOSE VEIN SURGERY       OB History    Gravida  0   Para      Term      Preterm      AB      Living        SAB      TAB      Ectopic      Multiple      Live Births               Home Medications  Prior to Admission medications   Medication Sig Start Date End Date Taking? Authorizing Provider  busPIRone (BUSPAR) 30 MG tablet Take 45 mg by mouth 3 (three) times daily.     [provider]  chlorproMAZINE (THORAZINE) 50 MG tablet Take 1 tablet (50 mg total) by mouth 3 (three) times daily. For agitation/mood control 02/09/17   Lindell Spar I, NP  dicyclomine (BENTYL) 20 MG tablet Take 1 tablet (20 mg total) by mouth 3 (three) times daily with meals as needed for spasms (abdominal pain and/or diarrhea). Patient not taking: Reported on 07/12/2017 05/26/17   Street, [place], PA-C  gabapentin (NEURONTIN) 400 MG capsule Take 1 capsule (400 mg total) by mouth 3 (three) times daily. For agitation/diabetic neuropathy 02/09/17   Lindell Spar I, NP  insulin aspart (NOVOLOG) 100 UNIT/ML injection Inject 6 Units into the  skin 3 (three) times daily with meals. For diabetes management 07/17/17   Montine Circle, PA-C  insulin detemir (LEVEMIR) 100 UNIT/ML injection Inject 0.68 mLs (68 Units total) into the skin 2 (two) times daily. For diabetes management 07/17/17   Langston Masker B, PA-C  Insulin Syringe-Needle U-100 25G X 1" 1 ML MISC 1 Syringe by Does not apply route 4 (four) times daily -  before meals and at bedtime. 07/17/17   Langston Masker B, PA-C  levETIRAcetam (KEPPRA) 500 MG tablet Take 1 tablet (500 mg total) by mouth 2 (two) times daily. For mood stabilization 02/09/17   Lindell Spar I, NP  lisinopril (PRINIVIL,ZESTRIL) 10 MG tablet Take 1 tablet (10 mg total) by mouth daily. For high blood pressure 02/09/17   [place]oko, Herbert Pun I, NP  Melatonin 5 MG TABS Take 5 mg by mouth at bedtime.    [provider]  QUEtiapine (SEROQUEL XR) 200 MG 24 hr tablet Take 500 mg by mouth at bedtime.     [provider]  QUEtiapine (SEROQUEL) 100 MG tablet Take 5 tablets (500 mg total) by mouth at bedtime. For mood control Patient taking differently: Take 100 mg by mouth daily.  02/09/17   Lindell Spar I, NP  sertraline (ZOLOFT) 100 MG tablet Take 200 mg by mouth daily.    [provider]  simvastatin (ZOCOR) 10 MG tablet Take 10 mg by mouth at bedtime.    [provider]  topiramate (TOPAMAX) 100 MG tablet Take 100 mg by mouth 2 (two) times daily.    [provider]  traZODone (DESYREL) 50 MG tablet Take 50 mg by mouth at bedtime.    [provider]    Family History Family History  Problem Relation Age of Onset  . Coronary artery disease Maternal Grandmother   . Diabetes type II Maternal Grandmother   . Cancer Maternal Grandmother   . Hypertension Mother   . Hypertension Father     Social History Social History   Tobacco Use  . Smoking status: Never Smoker  . Smokeless tobacco: Never Used  Substance Use Topics  . Alcohol use: No  . Drug use: No     Allergies     Fish-derived products; Geodon [ziprasidone hcl]; Haldol [haloperidol lactate]; Buprenorphine hcl; Compazine [prochlorperazine]; Morphine and related; and Toradol [ketorolac tromethamine]   Review of Systems Review of Systems  Unable to perform ROS: Psychiatric disorder     Physical Exam Updated Vital Signs Ht 5\' 4"  (1.626 m)   Wt 102.5 kg (226 lb)   LMP 07/02/2017   BMI 38.79 kg/m   Physical Exam  Constitutional: She appears well-developed and  well-nourished. No distress.  HENT:  Head: Normocephalic and atraumatic.  Eyes: Conjunctivae are normal.  Neck: Neck supple.  Cardiovascular: Regular rhythm. Tachycardia present.  No murmur heard. Pulmonary/Chest: Effort normal and breath sounds normal. No respiratory distress.  Abdominal: Soft. There is no tenderness.  Musculoskeletal: She exhibits no edema.  Neurological: She is alert.  Patient is awake appears slightly tired.  She is grossly following commands but will not really comply with a neurologic exam.  Pupils are 6 mm and reactive.  She has got no clonus.  Skin: Skin is warm and dry.  Psychiatric: She has a normal mood and affect.  Nursing note and vitals reviewed.    ED Treatments / Results  Labs (all labs ordered are listed, but only abnormal results are displayed) Labs Reviewed  COMPREHENSIVE METABOLIC PANEL - Abnormal; Notable for the following components:      Result Value   Sodium 132 (*)    Chloride 95 (*)    Glucose, Bld 428 (*)    BUN <5 (*)    AST 45 (*)    All other components within normal limits  RAPID URINE DRUG SCREEN, HOSP PERFORMED - Abnormal; Notable for the following components:   Barbiturates   (*)    Value: Result not available. Reagent lot number recalled by manufacturer.   All other components within normal limits  CBC WITH DIFFERENTIAL/PLATELET - Abnormal; Notable for the following components:   MCH 25.1 (*)    Platelets 124 (*)    All other components within normal limits   ACETAMINOPHEN LEVEL - Abnormal; Notable for the following components:   Acetaminophen (Tylenol), Serum <10 (*)    All other components within normal limits  MAGNESIUM - Abnormal; Notable for the following components:   Magnesium 1.6 (*)    All other components within normal limits  HEMOGLOBIN A1C - Abnormal; Notable for the following components:   Hgb A1c MFr Bld 12.4 (*)    All other components within normal limits  CBC - Abnormal; Notable for the following components:   Hemoglobin 11.4 (*)    MCH 24.9 (*)    Platelets 112 (*)    All other components within normal limits  COMPREHENSIVE METABOLIC PANEL - Abnormal; Notable for the following components:   Sodium 134 (*)    Glucose, Bld 470 (*)    All other components within normal limits  GLUCOSE, CAPILLARY - Abnormal; Notable for the following components:   Glucose-Capillary 447 (*)    All other components within normal limits  GLUCOSE, CAPILLARY - Abnormal; Notable for the following components:   Glucose-Capillary 385 (*)    All other components within normal limits  MRSA PCR SCREENING  ETHANOL  SALICYLATE LEVEL  I-STAT BETA HCG BLOOD, ED ([facility], [facility], [facility] ONLY)  CBG MONITORING, ED  CBG MONITORING, ED    EKG None - see in clinical course  Radiology No results found.  Procedures .Critical Care Performed by: Hayden Rasmussen, MD Authorized by: Hayden Rasmussen, MD   Critical care provider statement:    Critical care time (minutes):  30   Critical care time was exclusive of:  Separately billable procedures and treating other patients   Critical care was necessary to treat or prevent imminent or life-threatening deterioration of the following conditions:  CNS failure or compromise and toxidrome   Critical care was time spent personally by me on the following activities:  Development of treatment plan with patient or surrogate, discussions with consultants, evaluation of patient's  response to treatment, examination of patient,  obtaining history from patient or surrogate, ordering and performing treatments and interventions, ordering and review of laboratory studies, pulse oximetry, re-evaluation of patient's condition and review of old charts   I assumed direction of critical care for this patient from another provider in my specialty: no     (including critical care time)  Medications Ordered in ED Medications - No data to display   Initial Impression / Assessment and Plan / ED Course  I have reviewed the triage vital signs and the nursing notes.  Pertinent labs & imaging results that were available during my care of the patient were reviewed by me and considered in my medical decision making (see chart for details).  Clinical Course as of Jul 28 834  Thu Jul 26, 2017  2005 Patient with possible polypharmacy ingestion just prior to arrival.  She is mildly tachycardic otherwise with normal vitals and a nonfocal neuro exam although she is unwilling to interact with me.  She is getting an EKG some screening lab work.  She does have a history of overdoses and at the ER fairly frequently for psychiatric issues.   [MB]  2011 I filled out IVC paperwork as the patient was a suicide attempt by ingestion.   [MB]  2052 EKG is sinus tachycardia borderline QTC prolongation QRS 0.108.  There is poor R wave progression low voltage no acute ST-T changes.  Similar pattern to 619.   [MB]  2058 Call the patient's room for possible seizure activity.  I found the patient shaking with her hands and her legs bilateral while attempting to use her arms to set herself up from the bed using the handrails.  When I asked her if she was in pain she said no I asked her if she wanted a blanket she said yes.  We are giving her some Ativan to help her relax in a warm blanket.   [MB]  2105 Discussed with [place] control.  Their recommendations are mainly supportive.  They fear she will need at least 8-12 hours of observation because  of the extended release/ also needs a repeat EKG at 4 hours and 8 hours to make sure her intervals are not prolonging.  They are asking me to also check a magnesium.  Main risk would be seizures and benzos would be treatment.   [MB]  2129 Due to the prolonged amount of time the patient would need cardiac monitoring of paged unassigned team for admission.   [MB]  2132 Discussed with the family practice hospitalist team regarding admission and they accept the patient to their service.   [MB]    Clinical Course User Index [MB] Hayden Rasmussen, MD     Final Clinical Impressions(s) / ED Diagnoses   Final diagnoses:  Suicide attempt [facility])  Overdose of antidepressant, intentional self-harm, initial encounter [facility])    ED Discharge Orders    None       Hayden Rasmussen, MD 07/27/17 [phone]

## 2017-07-26 NOTE — ED Notes (Signed)
Pt started shaking hands uncontrollably. Pt still non verbal. MD Melina Copa at bedside.

## 2017-07-27 DIAGNOSIS — Z736 Limitation of activities due to disability: Secondary | ICD-10-CM

## 2017-07-27 DIAGNOSIS — T43202A Poisoning by unspecified antidepressants, intentional self-harm, initial encounter: Secondary | ICD-10-CM | POA: Diagnosis not present

## 2017-07-27 DIAGNOSIS — Z79899 Other long term (current) drug therapy: Secondary | ICD-10-CM

## 2017-07-27 DIAGNOSIS — T1491XA Suicide attempt, initial encounter: Secondary | ICD-10-CM | POA: Diagnosis not present

## 2017-07-27 DIAGNOSIS — Z560 Unemployment, unspecified: Secondary | ICD-10-CM

## 2017-07-27 DIAGNOSIS — R45851 Suicidal ideations: Secondary | ICD-10-CM

## 2017-07-27 DIAGNOSIS — R440 Auditory hallucinations: Secondary | ICD-10-CM

## 2017-07-27 DIAGNOSIS — R441 Visual hallucinations: Secondary | ICD-10-CM

## 2017-07-27 LAB — COMPREHENSIVE METABOLIC PANEL
ALBUMIN: 3.7 g/dL (ref 3.5–5.0)
ALT: 24 U/L (ref 0–44)
AST: 35 U/L (ref 15–41)
Alkaline Phosphatase: 101 U/L (ref 38–126)
Anion gap: 9 (ref 5–15)
BILIRUBIN TOTAL: 0.6 mg/dL (ref 0.3–1.2)
BUN: 6 mg/dL (ref 6–20)
CALCIUM: 8.9 mg/dL (ref 8.9–10.3)
CO2: 25 mmol/L (ref 22–32)
Chloride: 100 mmol/L (ref 98–111)
Creatinine, Ser: 0.63 mg/dL (ref 0.44–1.00)
GFR calc Af Amer: >60 mL/min (ref >60)
Glucose, Bld: 470 mg/dL — ABNORMAL HIGH (ref 70–99)
POTASSIUM: 4.7 mmol/L (ref 3.5–5.1)
Sodium: 134 mmol/L — ABNORMAL LOW (ref 135–145)
TOTAL PROTEIN: 7.1 g/dL (ref 6.5–8.1)

## 2017-07-27 LAB — CBC
HCT: 37.2 % (ref 36.0–46.0)
HEMOGLOBIN: 11.4 g/dL — AB (ref 12.0–15.0)
MCH: 24.9 pg — ABNORMAL LOW (ref 26.0–34.0)
MCHC: 30.6 g/dL (ref 30.0–36.0)
MCV: 81.4 fL (ref 78.0–100.0)
Platelets: 112 10*3/uL — ABNORMAL LOW (ref 150–400)
RBC: 4.57 MIL/uL (ref 3.87–5.11)
RDW: 15.5 % (ref 11.5–15.5)
WBC: 4.4 10*3/uL (ref 4.0–10.5)

## 2017-07-27 LAB — GLUCOSE, CAPILLARY
GLUCOSE-CAPILLARY: 385 mg/dL — AB (ref 70–99)
Glucose-Capillary: 407 mg/dL — ABNORMAL HIGH (ref 70–99)
Glucose-Capillary: 380 mg/dL — ABNORMAL HIGH (ref 70–99)
Glucose-Capillary: 378 mg/dL — ABNORMAL HIGH (ref 70–99)
Glucose-Capillary: 297 mg/dL — ABNORMAL HIGH (ref 70–99)

## 2017-07-27 LAB — MRSA PCR SCREENING: MRSA BY PCR: NEGATIVE

## 2017-07-27 MED ORDER — INSULIN ASPART 100 UNIT/ML ~~LOC~~ SOLN
0.0000 [IU] | Freq: Every day | SUBCUTANEOUS | Status: DC
  Administered 2017-07-27: 3 [IU] via SUBCUTANEOUS
  Administered 2017-07-28: 5 [IU] via SUBCUTANEOUS
  Administered 2017-07-29: 4 [IU] via SUBCUTANEOUS

## 2017-07-27 MED ORDER — INSULIN ASPART 100 UNIT/ML ~~LOC~~ SOLN
0.0000 [IU] | Freq: Three times a day (TID) | SUBCUTANEOUS | Status: DC
  Administered 2017-07-27 (×2): 15 [IU] via SUBCUTANEOUS
  Administered 2017-07-28: 8 [IU] via SUBCUTANEOUS
  Administered 2017-07-28: 11 [IU] via SUBCUTANEOUS
  Administered 2017-07-28 – 2017-07-29 (×3): 8 [IU] via SUBCUTANEOUS
  Administered 2017-07-29: 5 [IU] via SUBCUTANEOUS
  Administered 2017-07-30: 8 [IU] via SUBCUTANEOUS
  Administered 2017-07-30: 11 [IU] via SUBCUTANEOUS
  Administered 2017-07-30: 5 [IU] via SUBCUTANEOUS

## 2017-07-27 MED ORDER — ATORVASTATIN CALCIUM 40 MG PO TABS
40.0000 mg | ORAL_TABLET | Freq: Every day | ORAL | Status: DC
  Administered 2017-07-27 – 2017-07-29 (×3): 40 mg via ORAL
  Filled 2017-07-27 (×3): qty 1

## 2017-07-27 MED ORDER — INSULIN GLARGINE 100 UNIT/ML ~~LOC~~ SOLN
34.0000 [IU] | Freq: Every day | SUBCUTANEOUS | Status: DC
  Administered 2017-07-27 – 2017-07-28 (×2): 34 [IU] via SUBCUTANEOUS
  Filled 2017-07-27 (×3): qty 0.34

## 2017-07-27 NOTE — Clinical Social Work Note (Signed)
Psychiatrist recommending inpatient psych admission when stable for discharge. Please consult CSW when patient is medically stable and referral process will be started.  Dayton Scrape, [place]

## 2017-07-27 NOTE — Consult Note (Addendum)
   [facility] CM Inpatient Consult   07/27/2017  AZLYN WINGLER 1985-04-08 [id number]   Patient screened for [facility] outreach referred prior to admission without success in [place] Management for services with [facility].   Chart review reveals HILARY MILKS a 32 y.o.femalepresenting with AMS after intentional overdose of Seroquel ER and buspirone. PMH is significant forprior suicide attempts, schizophrenia, bipolar disorder, and poorly controlled T2DM. Patient has had 21 ED visits and 3 hospitalizations in the past 6 months.  Came by room and patient is currently on suicide precaution with IVC and sitter. Did not interact at this point.  Will follow for disposition and progress.   For questions contact:   Natividad Brood, RN BSN [facility] Liaison  [phone] business mobile phone Toll free office [phone]

## 2017-07-27 NOTE — Progress Notes (Signed)
Family Medicine Teaching Service Daily Progress Note Intern Pager: [phone]  Patient name: Tina Saunders Medical record number: [id number] Date of birth: 1985/08/08 Age: 32 y.o. Gender: female  Primary Care Provider: Javier Docker, MD Consultants: Psych Code Status: Full  Pt Overview and Major Events to Date:  7/4 - pt admitted for attempted overdose  Assessment and Plan: Tina Saunders is a 32 y.o. female presenting with AMS after intentional overdose of Seroquel ER and buspirone. PMH is significant for prior suicide attempts, schizophrenia, bipolar disorder, and poorly controlled T2DM.  Intentional overdose: She reports hearing a voice telling her to take all of her pills. She took 2-3 pills of Seroquel and 20 pills of buspirone today and reports previous attempts at overdosing.  Vitals significant for tachycardia up to 125, blood pressure elevated to 178/91, likely consistent with anxiety.  Tylenol, ethanol, salicylate levels wnl.  UDS pending.  Per poison control, 8-12 hours of cardiac monitoring is warranted along with EKGs Q4H and frequent neuro checks. EKG on admission showed mildly elevated QT at 484 but was otherwise unremarkable. - Admitted to telemetry, attending Dr. Ardelia Mems - EKGs q4H. Monitor QT interval. - Neuro checks q4 hours for A&Os - Suicide precautions: IVC with sitter - Psychiatry consult - Hold Seroquel and Buspirone - Continue Chlorpromazine 50mg  PO TID, first dose 7/5 in AM; can also consider holding this medication if deemed appropriate - hold home gabapentin and topomax due to risk for increased sedation - Continue Keppra 500mg  BID for mood stabilization - Continue Zoloft 200mg  PO - Trazodone 50mg  qHS PRN sleep - psychiatry consulted  T2DM: Takes Levemir and Novolog at home, although she did not take these today. Her blood sugars normally run in the 400s. Patient has previous ED visits for hyperglycemia. - HbgA1c: 12.4 (7/4) - Insulin Aspart moderate  sliding scale qACHS - Insulin glargine 34 QDaily - IV NS 125cc/hr maintenance  Mild hyponatremia and hypochloremia, likely chronic given past measurements: Patient appears euvolemic.  Na 132, chloride 95. - NS @ 125 ml/hr  Hypertension: BP 151/95 mmHg in the ED.  Takes Lisinopril 10 mg daily at home. - Lisinopril 10mg  PO qDaily  HLD: patient has history of hyperlipidemia.  Last lipid panel with cholesterol 113, HDL 19, LDL 21, TG 366 in October 2018 - DCed Simvastatin 10mg  PO qDaily (7/5) - started atorvastatin 40 mg Q daily (7/5)  FEN/GI: carb-modified diet Prophylaxis: ambulation  Disposition: Med Surge  Subjective:  Pt continues to to endorse SI w/ audio and visual hallucinations.  Pt noted feeling hot overnight and was lying in bed with only a diapers on with a fan next to her head.   Objective: Temp:  [98.2 F (36.8 C)-98.7 F (37.1 C)] 98.2 F (36.8 C) (07/05 0851) Pulse Rate:  [97-141] 97 (07/05 0851) Resp:  [17-22] 18 (07/05 0851) BP: (120-178)/(68-122) 120/68 (07/05 0851) SpO2:  [92 %-99 %] 94 % (07/05 0851) Weight:  [96.6 kg (213 lb)-102.5 kg (226 lb)] 96.6 kg (213 lb) (07/05 0430) Physical Exam: Physical Exam  Constitutional: She appears well-developed and well-nourished.  HENT:  Head: Normocephalic and atraumatic.  Mouth/Throat: Oropharynx is clear and moist.  Eyes: Pupils are equal, round, and reactive to light. Conjunctivae and EOM are normal.  Cardiovascular: Regular rhythm. Exam reveals no gallop and no friction rub.  No murmur (tachycardic) heard. Pulmonary/Chest: Breath sounds normal. No stridor. No respiratory distress. She has no wheezes. She has no rales. She exhibits no tenderness.  Abdominal: Soft. Bowel sounds are  normal. Distention: protuberant stomach.  Musculoskeletal: She exhibits no edema.  Lymphadenopathy:    She has no cervical adenopathy.  Neurological: She is alert. No cranial nerve deficit.  Skin: No rash noted. No erythema.   Psychiatric:  Pt continues to endorse SI w/ audiovisual hallucinations.        Laboratory: Recent Labs  Lab 07/21/17 1416 07/26/17 2009 07/27/17 0518  WBC 3.8* 5.1 4.4  HGB 10.9* 12.2 11.4*  HCT 35.8* 39.4 37.2  PLT 100* 124* 112*   Recent Labs  Lab 07/21/17 1416 07/26/17 2009 07/27/17 0518  NA 133* 132* 134*  K 3.7 3.7 4.7  CL 100 95* 100  CO2 22 22 25   BUN <5* <5* 6  CREATININE 0.51 0.51 0.63  CALCIUM 8.3* 9.2 8.9  PROT 6.6 7.7 7.1  BILITOT 0.6 0.7 0.6  ALKPHOS 94 106 101  ALT 20 25 24   AST 38 45* 35  GLUCOSE 448* 428* 470*   Imaging/Diagnostic Tests:  Dg Chest 2 View  Result Date: 07/21/2017 CLINICAL DATA:  Patient reports cough X 2 months. EXAM: CHEST - 2 VIEW COMPARISON:  04/11/2017 FINDINGS: Lungs are clear. Heart size and mediastinal contours are within normal limits. Blunting of posterior costophrenic angle since prior study suggesting tiny pleural effusions. No pneumothorax. Visualized bones unremarkable. IMPRESSION: Probable new tiny pleural effusions.  Otherwise negative. Electronically Signed   By: Lucrezia Europe M.D.   On: 07/21/2017 14:51     Matilde Haymaker, MD 07/27/2017, 9:43 AM PGY-1, [place] Intern pager: [phone], text pages welcome

## 2017-07-27 NOTE — Progress Notes (Signed)
Inpatient Diabetes Program Recommendations  AACE/ADA: New Consensus Statement on Inpatient Glycemic Control (2015)  Target Ranges:  Prepandial:   less than 140 mg/dL      Peak postprandial:   less than 180 mg/dL (1-2 hours)      Critically ill patients:  140 - 180 mg/dL   Lab Results  Component Value Date   GLUCAP 380 (H) 07/27/2017   HGBA1C 12.4 (H) 07/26/2017    Review of Glycemic Control  Diabetes history: DM2 Outpatient Diabetes medications: Lantus 34 units QD, Novolog 0-15 units tidwc and hs Current orders for Inpatient glycemic control: Levemir 34 units QD, Novolog 0-15 units tidwc and hs  Inpatient Diabetes Program Recommendations:     Add Novolog 6 units tidwc for meal coverage insulin.  Continue to follow. Will likely be d/ced to [facility].   Thank you. Lorenda Peck, RD, LDN, CDE Inpatient Diabetes Coordinator [phone]

## 2017-07-27 NOTE — Care Management Note (Signed)
Case Management Note  Patient Details  Name: Tina Saunders MRN: [id number] Date of Birth: November 22, 1985  Subjective/Objective:                  Overdose  Action/Plan: Intentional Overdose; Psych consulted; has private insurance with [employer]. CM will continue to follow for progression of care.  Expected Discharge Date:     Possibly 07/30/2017              [facility]  [facility]  In-House Referral:  Clinical Social Work  Discharge planning Services  CM Consult  Status of Service:  In process, will continue to follow  Sherrilyn Rist [phone] 07/27/2017, 2:36 PM

## 2017-07-27 NOTE — Consult Note (Signed)
[facility] Psychiatry Consult   Reason for Consult:  Intentional overdose  Referring Physician:  Dr. Ardelia Mems  Patient Identification: Tina Saunders MRN:  [id number] Principal Diagnosis: Suicide attempt [facility] : [facility]) Diagnosis:   Patient Active Problem List   Diagnosis Date Noted  . Overdose [T50.901A] 07/26/2017  . Suicide attempt by other psychotropic drug overdose ([place]) [T43.8X2A]   . DKA (diabetic ketoacidoses) ([place]) [E13.10] 03/14/2017  . Chronic constipation [K59.09]   . Seizures ([place]) [R56.9] 12/11/2016  . DM2 (diabetes mellitus, type 2) ([place]) [E11.9] 12/11/2016  . Uncontrolled diabetes mellitus ([place]) [E11.65] 11/03/2016  . Schizoaffective disorder, bipolar type ([place]) [F25.0] 11/02/2016  . 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 Suicidal ideation [R45.851]   . 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 HTN (hypertension) [I10] 04/15/2013  . Post traumatic stress disorder [F43.10] 12/07/2011  . PSVT (paroxysmal supraventricular tachycardia) ([place]) [I47.1] 08/26/2011  . ADHD [F90.9] 09/23/2007  . EPIGASTRIC PAIN [R10.13] 09/23/2007  . Obesity, unspecified [E66.9] 07/30/2007  . Depression [F32.9] 07/30/2007  . SLEEP DISORDER [G47.9] 07/30/2007  . METRORRHAGIA [N92.1] 06/13/2006  . DISORDER, MENSTRUAL NEC [N94.9] 06/13/2006  . POLYCYSTIC OVARIAN DISEASE [E28.2] 04/25/2006  . AMENORRHEA, SECONDARY [N91.2] 04/20/2006  . ACNE, MILD [L70.8] 04/20/2006    Total Time spent with patient: 1 hour  Subjective:   Tina Saunders is a 32 y.o. female patient admitted with intentional overdose of Seroquel and Buspar.  HPI:   Per chart review, Tina Saunders was admitted with intentional overdose of Seroquel ER and Buspar. She has a history of schizophrenia, bipolar disorder and multiple prior suicide attempts. She endorses CAH that told her to take all of her pills. She took 2-3 tabelts of Seroquel and 20 tablets of Buspar. She was tachycardic to 125 and hypertensive to  178/91 on admission. She was witnessed to have seizure like activity with bilateral hand shaking in the ED. It was determined to be nonepileptiform. She had a similar episode with unresponsiveness when admitted to the medical floor so the rapid response team was called. Seroquel and Buspar are hold. She was continued on Thorazine 50 mg TID, Zoloft 200 mg daily and Trazodone 50 mg qhs PRN. UDS and BAL were negative on admission.   Of note, patient was last seen at [facility] on 6/14 after reportedly overdosing on 33 tablets of Thorazine. Her suicide attempt was highly unlikely due to patient not appearing altered or somnolent. She is well known to the psychiatry service and frequent presents to the ED with SI for secondary gain including attention seeking/companionship. She has an ACT team that closely follows her.   On interview, Tina Saunders reports that she "feels like I do not want to be in the world."  She reports that she overdosed on Seroquel and Buspar to end her life.  She denies ingesting other medications.  She reports taking the medication around 6:30 pm and then calling EMS.  She reports suicidal thoughts for the past 2 days.  She is unable to identify a stressor.  She reports CAH.  She reports hearing and seeing a man named Remo Lipps.  He tells her to harm her self.  She reports chronic AVH but this is the first time she has listened to the voices.  She reports one other suicide attempt although she does not remember.  This is in contrast to her previous hospital admissions where she has reported overdosing multiple times.  She denies problems with sleep or appetite.  She reports medication compliance.  Past Psychiatric History: Schizophrenia, cluster b personality disorder depression, bipolar disorder and anxiety.   Risk to Self:  Yes given suicide attempt.  Risk to Others:  None. Denies HI.  Prior Inpatient Therapy:  She was hospitalized in January 2019 for suicide attempt by overdose. Discharge  medications included Thorazine 50 mg TID, Gabapentin 400 mg TID, Trazodone 50 mg qhs PRN, Atarax 50 mg TID and Seroquel 50 mg BID and 500 mg qhs.  Prior Outpatient Therapy:  She was previously followed by the ACT team.   Past Medical History:  Past Medical History:  Diagnosis Date  . Anxiety   . Asthma   . Bipolar 1 disorder ([place])   . Cancer of abdominal wall   . Depression   . Diabetes mellitus without complication ([place])   . High cholesterol   . Hypertension   . Intentional drug overdose ([place]) 07/26/2017   Seroquel and buspirone   . Obesity   . Obesity   . Polycystic ovarian syndrome 07/01/2011   Patient report  . 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 Schizophrenia ([place])   . Seizures ([place])     Past Surgical History:  Procedure Laterality Date  . CHOLECYSTECTOMY    . COLONOSCOPY WITH PROPOFOL N/A 03/08/2017   Procedure: COLONOSCOPY WITH PROPOFOL;  Surgeon: Milus Banister, MD;  Location: [facility];  Service: Endoscopy;  Laterality: N/A;  . HERNIA REPAIR    . Ovarian Cyst Excision    . VARICOSE VEIN SURGERY     Family History:  Family History  Problem Relation Age of Onset  . Coronary artery disease Maternal Grandmother   . Diabetes type II Maternal Grandmother   . Cancer Maternal Grandmother   . Hypertension Mother   . Hypertension Father    Family Psychiatric  History: Unknown  Social History:  Social History   Substance and Sexual Activity  Alcohol Use No     Social History   Substance and Sexual Activity  Drug Use No    Social History   Socioeconomic History  . Marital status: Single    Spouse name: Not on file  . Number of children: 0  . Years of education: Not on file  . Highest education level: Not on file  Occupational History  . Not on file  Social Needs  . Financial resource strain: Not on file  . Food insecurity:    Worry: Not on file    Inability: Not on file  . Transportation needs:    Medical: Not on file    Non-medical: Not on file  Tobacco Use   . Smoking status: Never Smoker  . Smokeless tobacco: Never Used  Substance and Sexual Activity  . Alcohol use: No  . Drug use: No  . Sexual activity: Never    Birth control/protection: None  Lifestyle  . Physical activity:    Days per week: Not on file    Minutes per session: Not on file  . Stress: Not on file  Relationships  . Social connections:    Talks on phone: Not on file    Gets together: Not on file    Attends religious service: Not on file    Active member of club or organization: Not on file    Attends meetings of clubs or organizations: Not on file    Relationship status: Not on file  Other Topics Concern  . Not on file  Social History Narrative  . Not on file   Additional Social History: She lives at  home alone. She is unemployed and receives disability. She denies alcohol or illicit substance use.     Allergies:   Allergies  Allergen Reactions  . Fish-Derived Products Anaphylaxis    Can only eat [place]ounder  . Geodon [Ziprasidone Hcl] Other (See Comments)    Face pulls, cant swallow - Locked Jaw  . Haldol [Haloperidol Lactate] Other (See Comments)    Face pulls, can't swallow - Locked Jaw  . Buprenorphine Hcl Hives, Itching, Rash and Other (See Comments)    GI upset  . Compazine [Prochlorperazine] Other (See Comments)    anxiety and hyperactivity  . Morphine And Related Hives, Itching, Rash and Other (See Comments)    GI upset  . Toradol [Ketorolac Tromethamine] Other (See Comments)    Anxiety and hyperactivity    Labs:  Results for orders placed or performed during the hospital encounter of 07/26/17 (from the past 48 hour(s))  Magnesium     Status: Abnormal   Collection Time: 07/26/17  7:52 PM  Result Value Ref Range   Magnesium 1.6 (L) 1.7 - 2.4 mg/dL    Comment: Performed at [facility], [place] [address] [place], [place] [address]  I-Stat beta hCG blood, ED     Status: None   Collection Time: 07/26/17  8:06 PM  Result Value Ref Range    I-stat hCG, quantitative <5.0 <5 mIU/mL   Comment 3            Comment:   GEST. AGE      CONC.  (mIU/mL)   <=1 WEEK        5 - 50     2 WEEKS       50 - 500     3 WEEKS       100 - 10,000     4 WEEKS     1,000 - 30,000        FEMALE AND NON-PREGNANT FEMALE:     LESS THAN 5 mIU/mL   Comprehensive metabolic panel     Status: Abnormal   Collection Time: 07/26/17  8:09 PM  Result Value Ref Range   Sodium 132 (L) 135 - 145 mmol/L   Potassium 3.7 3.5 - 5.1 mmol/L   Chloride 95 (L) 98 - 111 mmol/L    Comment: Please note change in reference range.   CO2 22 22 - 32 mmol/L   Glucose, Bld 428 (H) 70 - 99 mg/dL    Comment: Please note change in reference range.   BUN <5 (L) 6 - 20 mg/dL    Comment: Please note change in reference range.   Creatinine, Ser 0.51 0.44 - 1.00 mg/dL   Calcium 9.2 8.9 - 10.3 mg/dL   Total Protein 7.7 6.5 - 8.1 g/dL   Albumin 3.9 3.5 - 5.0 g/dL   AST 45 (H) 15 - 41 U/L   ALT 25 0 - 44 U/L    Comment: Please note change in reference range.   Alkaline Phosphatase 106 38 - 126 U/L   Total Bilirubin 0.7 0.3 - 1.2 mg/dL   GFR calc non Af Amer >60 >60 mL/min   GFR calc Af Amer >60 >60 mL/min    Comment: (NOTE) The eGFR has been calculated using the CKD EPI equation. This calculation has not been validated in all clinical situations. eGFR's persistently <60 mL/min signify possible Chronic Kidney Disease.    Anion gap 15 5 - 15    Comment: Performed at [facility], [address] [place], [place] [address]  CBC with Diff     Status: Abnormal   Collection Time: 07/26/17  8:09 PM  Result Value Ref Range   WBC 5.1 4.0 - 10.5 K/uL   RBC 4.87 3.87 - 5.11 MIL/uL   Hemoglobin 12.2 12.0 - 15.0 g/dL   HCT 39.4 36.0 - 46.0 %   MCV 80.9 78.0 - 100.0 fL   MCH 25.1 (L) 26.0 - 34.0 pg   MCHC 31.0 30.0 - 36.0 g/dL   RDW 15.3 11.5 - 15.5 %   Platelets 124 (L) 150 - 400 K/uL   Neutrophils Relative % 69 %   Neutro Abs 3.5 1.7 - 7.7 K/uL   Lymphocytes Relative 23 %    Lymphs Abs 1.2 0.7 - 4.0 K/uL   Monocytes Relative 5 %   Monocytes Absolute 0.2 0.1 - 1.0 K/uL   Eosinophils Relative 3 %   Eosinophils Absolute 0.1 0.0 - 0.7 K/uL   Basophils Relative 0 %   Basophils Absolute 0.0 0.0 - 0.1 K/uL   Immature Granulocytes 0 %   Abs Immature Granulocytes 0.0 0.0 - 0.1 K/uL    Comment: Performed at [facility] [address] [place], [place] [address]  Ethanol     Status: None   Collection Time: 07/26/17  8:10 PM  Result Value Ref Range   Alcohol, Ethyl (B) <10 <10 mg/dL    Comment: (NOTE) Lowest detectable limit for serum alcohol is 10 mg/dL. For medical purposes only. Performed at [facility], [place] [address] [place], [place] [address]   Acetaminophen level     Status: Abnormal   Collection Time: 07/26/17  8:10 PM  Result Value Ref Range   Acetaminophen (Tylenol), Serum <10 (L) 10 - 30 ug/mL    Comment: (NOTE) Therapeutic concentrations vary significantly. A range of 10-30 ug/mL  may be an effective concentration for many patients. However, some  are best treated at concentrations outside of this range. Acetaminophen concentrations >150 ug/mL at 4 hours after ingestion  and >50 ug/mL at 12 hours after ingestion are often associated with  toxic reactions. Performed at [facility], [place] [address], [place], [place] [address]   Salicylate level     Status: None   Collection Time: 07/26/17  8:10 PM  Result Value Ref Range   Salicylate Lvl <3.1 2.8 - 30.0 mg/dL    Comment: Performed at [facility] [address] [place], [place] [address]  Urine rapid drug screen (hosp performed)     Status: Abnormal   Collection Time: 07/26/17 10:06 PM  Result Value Ref Range   Opiates NONE DETECTED NONE DETECTED   Cocaine NONE DETECTED NONE DETECTED   Benzodiazepines NONE DETECTED NONE DETECTED   Amphetamines NONE DETECTED NONE DETECTED   Tetrahydrocannabinol NONE DETECTED NONE DETECTED   Barbiturates (A) NONE DETECTED     Result not available. Reagent lot number recalled by manufacturer.    Comment: Performed at [facility], [place] [address], [place], [place] [address]  Hemoglobin A1c     Status: Abnormal   Collection Time: 07/26/17 10:29 PM  Result Value Ref Range   Hgb A1c MFr Bld 12.4 (H) 4.8 - 5.6 %    Comment: (NOTE) Pre diabetes:          5.7%-6.4% Diabetes:              >6.4% Glycemic control for   <7.0% adults with diabetes  Mean Plasma Glucose 309.18 mg/dL    Comment: Performed at [facility] [address] [place], [place] [address]  Glucose, capillary     Status: Abnormal   Collection Time: 07/26/17 10:43 PM  Result Value Ref Range   Glucose-Capillary 447 (H) 70 - 99 mg/dL   Comment 1 Notify RN    Comment 2 Document in Chart   MRSA PCR Screening     Status: None   Collection Time: 07/27/17 12:19 AM  Result Value Ref Range   MRSA by PCR NEGATIVE NEGATIVE    Comment:        The GeneXpert MRSA Assay (FDA approved for NASAL specimens only), is one component of a comprehensive MRSA colonization surveillance program. It is not intended to diagnose MRSA infection nor to guide or monitor treatment for MRSA infections. Performed at [facility], [place] [address] [place], [place] [address]   CBC     Status: Abnormal   Collection Time: 07/27/17  5:18 AM  Result Value Ref Range   WBC 4.4 4.0 - 10.5 K/uL   RBC 4.57 3.87 - 5.11 MIL/uL   Hemoglobin 11.4 (L) 12.0 - 15.0 g/dL   HCT 37.2 36.0 - 46.0 %   MCV 81.4 78.0 - 100.0 fL   MCH 24.9 (L) 26.0 - 34.0 pg   MCHC 30.6 30.0 - 36.0 g/dL   RDW 15.5 11.5 - 15.5 %   Platelets 112 (L) 150 - 400 K/uL    Comment: CONSISTENT WITH PREVIOUS RESULT Performed at [facility], [place] [address], [place], [place] [address]   Comprehensive metabolic panel     Status: Abnormal   Collection Time: 07/27/17  5:18 AM  Result Value Ref Range   Sodium 134 (L) 135 - 145 mmol/L   Potassium 4.7 3.5 - 5.1 mmol/L   Chloride 100 98 - 111  mmol/L    Comment: Please note change in reference range.   CO2 25 22 - 32 mmol/L   Glucose, Bld 470 (H) 70 - 99 mg/dL    Comment: Please note change in reference range.   BUN 6 6 - 20 mg/dL    Comment: Please note change in reference range.   Creatinine, Ser 0.63 0.44 - 1.00 mg/dL   Calcium 8.9 8.9 - 10.3 mg/dL   Total Protein 7.1 6.5 - 8.1 g/dL   Albumin 3.7 3.5 - 5.0 g/dL   AST 35 15 - 41 U/L   ALT 24 0 - 44 U/L    Comment: Please note change in reference range.   Alkaline Phosphatase 101 38 - 126 U/L   Total Bilirubin 0.6 0.3 - 1.2 mg/dL   GFR calc non Af Amer >60 >60 mL/min   GFR calc Af Amer >60 >60 mL/min    Comment: (NOTE) The eGFR has been calculated using the CKD EPI equation. This calculation has not been validated in all clinical situations. eGFR's persistently <60 mL/min signify possible Chronic Kidney Disease.    Anion gap 9 5 - 15    Comment: Performed at [facility] [address] [place], [place] [address]  Glucose, capillary     Status: Abnormal   Collection Time: 07/27/17  8:09 AM  Result Value Ref Range   Glucose-Capillary 385 (H) 70 - 99 mg/dL   Comment 1 Notify RN    Comment 2 Document in Chart   Glucose, capillary     Status: Abnormal   Collection Time: 07/27/17 11:50 AM  Result Value Ref Range   Glucose-Capillary 380 (H) 70 - 99 mg/dL   Comment 1 Notify RN    Comment 2 Document in Chart     Current Facility-Administered Medications  Medication Dose Route Frequency Provider Last Rate Last Dose  . 0.9 %  sodium chloride infusion   Intravenous Continuous Kathrene Alu, MD 125 mL/hr at 07/27/17 0820    . atorvastatin (LIPITOR) tablet 40 mg  40 mg Oral QHS Rittberger, Bernita Raisin, DO      . chlorproMAZINE (THORAZINE) tablet 50 mg  50 mg Oral TID Kathrene Alu, MD   50 mg at 07/27/17 7673  . insulin aspart (novoLOG) injection 0-15 Units  0-15 Units Subcutaneous TID WC Bland, Scott, DO   15 Units at 07/27/17 1200  . insulin aspart (novoLOG)  injection 0-5 Units  0-5 Units Subcutaneous QHS Bland, Scott, DO      . insulin glargine (LANTUS) injection 34 Units  34 Units Subcutaneous Daily Riccio, Angela C, DO   34 Units at 07/27/17 1155  . levETIRAcetam (KEPPRA) tablet 500 mg  500 mg Oral BID Kathrene Alu, MD   500 mg at 07/27/17 4193  . lisinopril (PRINIVIL,ZESTRIL) tablet 10 mg  10 mg Oral Daily Kathrene Alu, MD   10 mg at 07/27/17 7902  . Melatonin TABS 4.5 mg  4.5 mg Oral QHS Kathrene Alu, MD   4.5 mg at 07/27/17 0010  . sertraline (ZOLOFT) tablet 200 mg  200 mg Oral Daily Kathrene Alu, MD   200 mg at 07/27/17 4097  . traZODone (DESYREL) tablet 50 mg  50 mg Oral QHS PRN Winfrey, Alcario Drought, MD        Musculoskeletal: Strength & Muscle Tone: within normal limits Gait & Station: UTA since patient is lying in bed. Patient leans: N/A  Psychiatric Specialty Exam: Physical Exam  Nursing note and vitals reviewed. Constitutional: She is oriented to person, place, and time. She appears well-developed and well-nourished.  HENT:  Head: Normocephalic and atraumatic.  Neck: Normal range of motion.  Respiratory: Effort normal.  Musculoskeletal: Normal range of motion.  Neurological: She is alert and oriented to person, place, and time.  Psychiatric: Her speech is normal and behavior is normal. Thought content normal. Cognition and memory are normal. She expresses impulsivity. She exhibits a depressed mood.    Review of Systems  Cardiovascular: Negative for chest pain.  Gastrointestinal: Negative for abdominal pain, constipation, diarrhea, nausea and vomiting.  Neurological: Positive for dizziness.  Psychiatric/Behavioral: Positive for depression, hallucinations (AV) and suicidal ideas. Negative for substance abuse. The patient does not have insomnia.   All other systems reviewed and are negative.   Blood pressure (!) 108/59, pulse (!) 101, temperature 98.8 F (37.1 C), temperature source Oral, resp. rate 18, height  _0  (1.626 m), weight 96.6 kg (213 lb), last menstrual period 07/02/2017, SpO2 98 %.Body mass index is 36.56 kg/m.  General Appearance: Fairly Groomed, young, obese, Caucasian female, wearing an adult pull up and lying in bed. NAD.   Eye Contact:  Good  Speech:  Clear and Coherent and Normal Rate  Volume:  Normal  Mood:  Depressed  Affect:  Constricted  Thought Process:  Goal Directed, Linear and Descriptions of Associations: Intact  Orientation:  Full (Time, Place, and Person)  Thought Content:  Logical  Suicidal Thoughts:  Yes.  with intent/plan  Homicidal Thoughts:  No  Memory:  Immediate;   Fair Recent;   Fair Remote;  Fair  Judgement:  Poor  Insight:  Poor  Psychomotor Activity:  Normal  Concentration:  Concentration: Good and Attention Span: Good  Recall:  [employer] of Knowledge:  Fair  Language:  Fair  Akathisia:  No  Handed:  Right  AIMS (if indicated):   N/A  Assets:  Financial Resources/Insurance Housing  ADL's:  Intact  Cognition:  WNL  Sleep:   Okay   Assessment:  MELISSIA LAHMAN is a 32 y.o. female who was admitted with suicide attempt by Seroquel and Buspar overdose. She reports depression without an identifiable stressor. She endorses current SI and is unable to safety plan. She endorses AVH but she does not appear to be responding to internal stimuli. She was seen in the ED 3 weeks ago for a similar presentation. She is failing outpatient management and warrants inpatient psychiatric hospitalization for treatment and stabilization.   Treatment Plan Summary: -Patient warrants inpatient psychiatric hospitalization given high risk of harm to self. -Continue bedside sitter.  -Continue to hold Seroquel and Buspar. Zoloft and Trazodone should be discontinued due to risk for serotonin syndrome.  -Continue Thorazine 50 mg TID for mood stabilization/agitation.  -Please pursue involuntary commitment if patient refuses voluntary psychiatric hospitalization or attempts  to leave the hospital.  -Will sign off on patient at this time. Please consult psychiatry again as needed.    Disposition: Recommend psychiatric Inpatient admission when medically cleared.  Faythe Dingwall, DO 07/27/2017 1:06 PM

## 2017-07-27 NOTE — Progress Notes (Signed)
Patient's HR rate was elevated in the 140s-150s after walking in room. Patient started complaining of SOB after returning to bed and started having tremors. MD was paged and rapid response was called. Will continue to monitor.    Drue Flirt, RN

## 2017-07-27 NOTE — Progress Notes (Signed)
Patient arrived from ED and was oriented to the unit. Patient appeared anxious but vitals were stable. Will continue to monitor.    Drue Flirt, RN

## 2017-07-28 DIAGNOSIS — E871 Hypo-osmolality and hyponatremia: Secondary | ICD-10-CM | POA: Diagnosis present

## 2017-07-28 DIAGNOSIS — T50901A Poisoning by unspecified drugs, medicaments and biological substances, accidental (unintentional), initial encounter: Secondary | ICD-10-CM | POA: Diagnosis not present

## 2017-07-28 DIAGNOSIS — F909 Attention-deficit hyperactivity disorder, unspecified type: Secondary | ICD-10-CM | POA: Diagnosis present

## 2017-07-28 DIAGNOSIS — Z560 Unemployment, unspecified: Secondary | ICD-10-CM | POA: Diagnosis not present

## 2017-07-28 DIAGNOSIS — Z888 Allergy status to other drugs, medicaments and biological substances status: Secondary | ICD-10-CM | POA: Diagnosis not present

## 2017-07-28 DIAGNOSIS — E7800 Pure hypercholesterolemia, unspecified: Secondary | ICD-10-CM | POA: Diagnosis present

## 2017-07-28 DIAGNOSIS — H5461 Unqualified visual loss, right eye, normal vision left eye: Secondary | ICD-10-CM | POA: Diagnosis present

## 2017-07-28 DIAGNOSIS — E669 Obesity, unspecified: Secondary | ICD-10-CM | POA: Diagnosis present

## 2017-07-28 DIAGNOSIS — I10 Essential (primary) hypertension: Secondary | ICD-10-CM | POA: Diagnosis present

## 2017-07-28 DIAGNOSIS — Z6837 Body mass index (BMI) 37.0-37.9, adult: Secondary | ICD-10-CM | POA: Diagnosis not present

## 2017-07-28 DIAGNOSIS — E785 Hyperlipidemia, unspecified: Secondary | ICD-10-CM | POA: Diagnosis present

## 2017-07-28 DIAGNOSIS — Z794 Long term (current) use of insulin: Secondary | ICD-10-CM | POA: Diagnosis not present

## 2017-07-28 DIAGNOSIS — D61818 Other pancytopenia: Secondary | ICD-10-CM | POA: Diagnosis present

## 2017-07-28 DIAGNOSIS — F4325 Adjustment disorder with mixed disturbance of emotions and conduct: Secondary | ICD-10-CM | POA: Diagnosis present

## 2017-07-28 DIAGNOSIS — F250 Schizoaffective disorder, bipolar type: Secondary | ICD-10-CM | POA: Diagnosis present

## 2017-07-28 DIAGNOSIS — T43292A Poisoning by other antidepressants, intentional self-harm, initial encounter: Secondary | ICD-10-CM | POA: Diagnosis present

## 2017-07-28 DIAGNOSIS — Z91018 Allergy to other foods: Secondary | ICD-10-CM | POA: Diagnosis not present

## 2017-07-28 DIAGNOSIS — Z885 Allergy status to narcotic agent status: Secondary | ICD-10-CM | POA: Diagnosis not present

## 2017-07-28 DIAGNOSIS — T43202A Poisoning by unspecified antidepressants, intentional self-harm, initial encounter: Secondary | ICD-10-CM | POA: Diagnosis not present

## 2017-07-28 DIAGNOSIS — R9431 Abnormal electrocardiogram [ECG] [EKG]: Secondary | ICD-10-CM | POA: Diagnosis not present

## 2017-07-28 DIAGNOSIS — Z79890 Hormone replacement therapy (postmenopausal): Secondary | ICD-10-CM | POA: Diagnosis not present

## 2017-07-28 DIAGNOSIS — T43201A Poisoning by unspecified antidepressants, accidental (unintentional), initial encounter: Secondary | ICD-10-CM | POA: Diagnosis present

## 2017-07-28 DIAGNOSIS — Z79899 Other long term (current) drug therapy: Secondary | ICD-10-CM | POA: Diagnosis not present

## 2017-07-28 DIAGNOSIS — T1491XA Suicide attempt, initial encounter: Secondary | ICD-10-CM | POA: Diagnosis present

## 2017-07-28 DIAGNOSIS — E1165 Type 2 diabetes mellitus with hyperglycemia: Secondary | ICD-10-CM | POA: Diagnosis present

## 2017-07-28 DIAGNOSIS — Z736 Limitation of activities due to disability: Secondary | ICD-10-CM | POA: Diagnosis not present

## 2017-07-28 DIAGNOSIS — F4310 Post-traumatic stress disorder, unspecified: Secondary | ICD-10-CM | POA: Diagnosis present

## 2017-07-28 DIAGNOSIS — J45909 Unspecified asthma, uncomplicated: Secondary | ICD-10-CM | POA: Diagnosis present

## 2017-07-28 LAB — CBC
HCT: 34.8 % — ABNORMAL LOW (ref 36.0–46.0)
Hemoglobin: 10.6 g/dL — ABNORMAL LOW (ref 12.0–15.0)
MCH: 25.2 pg — ABNORMAL LOW (ref 26.0–34.0)
MCHC: 30.5 g/dL (ref 30.0–36.0)
MCV: 82.9 fL (ref 78.0–100.0)
PLATELETS: 108 10*3/uL — AB (ref 150–400)
RBC: 4.2 MIL/uL (ref 3.87–5.11)
RDW: 15.6 % — AB (ref 11.5–15.5)
WBC: 3.5 10*3/uL — AB (ref 4.0–10.5)

## 2017-07-28 LAB — HEPATIC FUNCTION PANEL
ALBUMIN: 3.2 g/dL — AB (ref 3.5–5.0)
ALT: 24 U/L (ref 0–44)
AST: 47 U/L — AB (ref 15–41)
Alkaline Phosphatase: 80 U/L (ref 38–126)
BILIRUBIN DIRECT: 0.2 mg/dL (ref 0.0–0.2)
Indirect Bilirubin: 0.5 mg/dL (ref 0.3–0.9)
TOTAL PROTEIN: 6.3 g/dL — AB (ref 6.5–8.1)
Total Bilirubin: 0.7 mg/dL (ref 0.3–1.2)

## 2017-07-28 LAB — GLUCOSE, CAPILLARY
GLUCOSE-CAPILLARY: 288 mg/dL — AB (ref 70–99)
GLUCOSE-CAPILLARY: 309 mg/dL — AB (ref 70–99)
GLUCOSE-CAPILLARY: 359 mg/dL — AB (ref 70–99)
Glucose-Capillary: 276 mg/dL — ABNORMAL HIGH (ref 70–99)
Glucose-Capillary: 310 mg/dL — ABNORMAL HIGH (ref 70–99)

## 2017-07-28 LAB — BASIC METABOLIC PANEL
ANION GAP: 9 (ref 5–15)
BUN: 6 mg/dL (ref 6–20)
CALCIUM: 7.7 mg/dL — AB (ref 8.9–10.3)
CO2: 23 mmol/L (ref 22–32)
Chloride: 105 mmol/L (ref 98–111)
Creatinine, Ser: 0.43 mg/dL — ABNORMAL LOW (ref 0.44–1.00)
Glucose, Bld: 283 mg/dL — ABNORMAL HIGH (ref 70–99)
Potassium: 4 mmol/L (ref 3.5–5.1)
SODIUM: 137 mmol/L (ref 135–145)

## 2017-07-28 LAB — MAGNESIUM: MAGNESIUM: 1.6 mg/dL — AB (ref 1.7–2.4)

## 2017-07-28 MED ORDER — ACETAMINOPHEN 325 MG PO TABS
650.0000 mg | ORAL_TABLET | Freq: Four times a day (QID) | ORAL | Status: DC | PRN
  Administered 2017-07-30: 650 mg via ORAL
  Filled 2017-07-28: qty 2

## 2017-07-28 MED ORDER — INSULIN GLARGINE 100 UNIT/ML ~~LOC~~ SOLN
34.0000 [IU] | Freq: Two times a day (BID) | SUBCUTANEOUS | Status: DC
  Administered 2017-07-28 – 2017-07-30 (×4): 34 [IU] via SUBCUTANEOUS
  Filled 2017-07-28 (×4): qty 0.34

## 2017-07-28 MED ORDER — MAGNESIUM SULFATE 2 GM/50ML IV SOLN
2.0000 g | Freq: Every day | INTRAVENOUS | Status: AC
  Administered 2017-07-28 – 2017-07-30 (×3): 2 g via INTRAVENOUS
  Filled 2017-07-28 (×3): qty 50

## 2017-07-28 NOTE — Progress Notes (Signed)
Paged [phone] to ask if pt can have order to shower, MD will order.

## 2017-07-28 NOTE — Progress Notes (Signed)
Family Medicine Teaching Service Daily Progress Note Intern Pager: [phone]  Patient name: Tina Saunders Medical record number: [id number] Date of birth: 05-Oct-1985 Age: 32 y.o. Gender: female  Primary Care Provider: Javier Docker, MD Consultants: Psych Code Status: Full  Pt Overview and Major Events to Date:  7/4 - pt admitted for attempted overdose  Assessment and Plan: Tina Saunders is a 32 y.o. female presenting with AMS after intentional overdose of Seroquel ER and buspirone. PMH is significant for prior suicide attempts, schizophrenia, bipolar disorder, and poorly controlled T2DM.  Intentional overdose: She reports hearing a voice telling her to take all of her pills. She took 2-3 pills of Seroquel and 20 pills of buspirone today and reports previous attempts at overdosing.  Vitals significant for tachycardia up to 125, blood pressure elevated to 178/91, likely consistent with anxiety.  Tylenol, ethanol, salicylate levels wnl.  UDS pending.  Per poison control, 8-12 hours of cardiac monitoring is warranted along with EKGs Q4H and frequent neuro checks. EKG on admission showed mildly elevated QT at 484 but was otherwise unremarkable. - Admitted to telemetry, attending Dr. Ardelia Mems - EKGs q4H. Monitor QT interval. - Admission QTc 475s (7/4 am) -> 521s (7/4pm) -> 510 (7/5 pm) - Neuro checks q4 hours for A&Os - Suicide precautions: IVC with sitter - Continue Chlorpromazine 50mg  PO TID, first dose 7/5 in AM - Continue Keppra 500mg  BID for mood stabilization - Continue Zoloft 200mg  PO - hold home gabapentin and topomax due to risk for increased sedation - Hold Seroquel, Buspirone and Trazodone due to concern for Serotonin syndrome - psychiatry consulted, will IVC, recs appreciated  T2DM: Takes Levemir and Novolog at home, although she did not take these today. Her blood sugars normally run in the 400s. Patient has previous ED visits for hyperglycemia. - HbgA1c: 12.4 (7/4) -  Insulin Aspart moderate sliding scale qACHS - Insulin glargine 34 BID - IV NS 125cc/hr maintenance  Mild hyponatremia and hypochloremia, likely chronic given past measurements: Patient appears euvolemic.  Na 132, chloride 95. - NS @ 125 ml/hr  Hypertension: BP 151/95 mmHg in the ED.  Takes Lisinopril 10 mg daily at home. - Lisinopril 10mg  PO qDaily  HLD: patient has history of hyperlipidemia.  Last lipid panel with cholesterol 113, HDL 19, LDL 21, TG 366 in October 2018 - DCed Simvastatin 10mg  PO qDaily (7/5) - started atorvastatin 40 mg Q daily (7/5)  FEN/GI: carb-modified diet Prophylaxis: ambulation  Disposition: Med Surge  Subjective:  Pt has no new complaints this am.  Pt denies SI/HI this am but continues to experience audiovisual hallucinations.  Pt is aware that she will be moved to an inpatient psych bed.  Pt denies chest pain, palpitations, stomach pain.  Objective: Temp:  [97.7 F (36.5 C)-98.8 F (37.1 C)] 97.9 F (36.6 C) (07/06 0558) Pulse Rate:  [73-101] 74 (07/06 0558) Resp:  [16-19] 18 (07/06 0558) BP: (108-124)/(54-68) 124/58 (07/06 0558) SpO2:  [94 %-99 %] 99 % (07/06 0558) Physical Exam: Physical Exam  Constitutional: She appears well-developed and well-nourished.  HENT:  Head: Normocephalic and atraumatic.  Eyes: Pupils are equal, round, and reactive to light. Conjunctivae and EOM are normal.  Dilated pupils (dim lighting in room)  Cardiovascular: Regular rhythm. Exam reveals no gallop and no friction rub.  No murmur (tachycardic) heard. Pulmonary/Chest: Breath sounds normal. No stridor. No respiratory distress. She has no wheezes. She has no rales. She exhibits no tenderness.  Abdominal: Soft. Bowel sounds are normal. Distention: protuberant  stomach.  Musculoskeletal: She exhibits no edema.  Lymphadenopathy:    She has no cervical adenopathy.  Neurological: She is alert. No cranial nerve deficit.  Skin: No rash noted. Diaphoretic: Pt lying in  bed this am, continues to keep fan bedside for cooling.  Pt was wearing only an adult diaper and partially convered with a blanket.   No erythema.  Psychiatric:  Pt denies SI/HI this am.  Continues to endorse audiovisual hallucinations.        Laboratory: Recent Labs  Lab 07/21/17 1416 07/26/17 2009 07/27/17 0518  WBC 3.8* 5.1 4.4  HGB 10.9* 12.2 11.4*  HCT 35.8* 39.4 37.2  PLT 100* 124* 112*   Recent Labs  Lab 07/21/17 1416 07/26/17 2009 07/27/17 0518  NA 133* 132* 134*  K 3.7 3.7 4.7  CL 100 95* 100  CO2 22 22 25   BUN <5* <5* 6  CREATININE 0.51 0.51 0.63  CALCIUM 8.3* 9.2 8.9  PROT 6.6 7.7 7.1  BILITOT 0.6 0.7 0.6  ALKPHOS 94 106 101  ALT 20 25 24   AST 38 45* 35  GLUCOSE 448* 428* 470*   Imaging/Diagnostic Tests:  Dg Chest 2 View  Result Date: 07/21/2017 CLINICAL DATA:  Patient reports cough X 2 months. EXAM: CHEST - 2 VIEW COMPARISON:  04/11/2017 FINDINGS: Lungs are clear. Heart size and mediastinal contours are within normal limits. Blunting of posterior costophrenic angle since prior study suggesting tiny pleural effusions. No pneumothorax. Visualized bones unremarkable. IMPRESSION: Probable new tiny pleural effusions.  Otherwise negative. Electronically Signed   By: Lucrezia Europe M.D.   On: 07/21/2017 14:51     Matilde Haymaker, MD 07/28/2017, 7:34 AM PGY-1, [facility] Intern pager: [phone], text pages welcome

## 2017-07-28 NOTE — Plan of Care (Signed)
  Problem: Clinical Measurements: Goal: Ability to maintain clinical measurements within normal limits will improve Outcome: Not Progressing     Patient remains sedentary, no ambulation in room or hallway to enhance strengthening.

## 2017-07-29 ENCOUNTER — Other Ambulatory Visit: Payer: Self-pay

## 2017-07-29 DIAGNOSIS — F4325 Adjustment disorder with mixed disturbance of emotions and conduct: Secondary | ICD-10-CM

## 2017-07-29 DIAGNOSIS — T50901A Poisoning by unspecified drugs, medicaments and biological substances, accidental (unintentional), initial encounter: Secondary | ICD-10-CM

## 2017-07-29 DIAGNOSIS — R9431 Abnormal electrocardiogram [ECG] [EKG]: Secondary | ICD-10-CM

## 2017-07-29 DIAGNOSIS — I10 Essential (primary) hypertension: Secondary | ICD-10-CM

## 2017-07-29 LAB — CBC
HCT: 32.5 % — ABNORMAL LOW (ref 36.0–46.0)
Hemoglobin: 10.1 g/dL — ABNORMAL LOW (ref 12.0–15.0)
MCH: 25.4 pg — ABNORMAL LOW (ref 26.0–34.0)
MCHC: 31.1 g/dL (ref 30.0–36.0)
MCV: 81.7 fL (ref 78.0–100.0)
Platelets: 95 10*3/uL — ABNORMAL LOW (ref 150–400)
RBC: 3.98 MIL/uL (ref 3.87–5.11)
RDW: 15 % (ref 11.5–15.5)
WBC: 2.7 10*3/uL — ABNORMAL LOW (ref 4.0–10.5)

## 2017-07-29 LAB — COMPREHENSIVE METABOLIC PANEL
ALT: 21 U/L (ref 0–44)
ANION GAP: 5 (ref 5–15)
AST: 33 U/L (ref 15–41)
Albumin: 3 g/dL — ABNORMAL LOW (ref 3.5–5.0)
Alkaline Phosphatase: 79 U/L (ref 38–126)
BILIRUBIN TOTAL: 0.6 mg/dL (ref 0.3–1.2)
BUN: <5 mg/dL — ABNORMAL LOW (ref 6–20)
CO2: 24 mmol/L (ref 22–32)
Calcium: 7.5 mg/dL — ABNORMAL LOW (ref 8.9–10.3)
Chloride: 109 mmol/L (ref 98–111)
Creatinine, Ser: 0.35 mg/dL — ABNORMAL LOW (ref 0.44–1.00)
GFR calc Af Amer: >60 mL/min (ref >60)
Glucose, Bld: 242 mg/dL — ABNORMAL HIGH (ref 70–99)
POTASSIUM: 4.1 mmol/L (ref 3.5–5.1)
Sodium: 138 mmol/L (ref 135–145)
Total Protein: 5.9 g/dL — ABNORMAL LOW (ref 6.5–8.1)

## 2017-07-29 LAB — MAGNESIUM: Magnesium: 1.8 mg/dL (ref 1.7–2.4)

## 2017-07-29 LAB — GLUCOSE, CAPILLARY
GLUCOSE-CAPILLARY: 252 mg/dL — AB (ref 70–99)
GLUCOSE-CAPILLARY: 296 mg/dL — AB (ref 70–99)
GLUCOSE-CAPILLARY: 309 mg/dL — AB (ref 70–99)
Glucose-Capillary: 241 mg/dL — ABNORMAL HIGH (ref 70–99)

## 2017-07-29 MED ORDER — MAGNESIUM SULFATE 2 GM/50ML IV SOLN: 2.0000 g | Freq: Once | INTRAVENOUS | Status: DC

## 2017-07-29 MED ORDER — CALCIUM CARBONATE ANTACID 500 MG PO CHEW
2.0000 | CHEWABLE_TABLET | Freq: Two times a day (BID) | ORAL | Status: AC
  Administered 2017-07-29 (×2): 400 mg via ORAL
  Filled 2017-07-29 (×2): qty 2

## 2017-07-29 MED ORDER — QUETIAPINE FUMARATE 25 MG PO TABS
100.0000 mg | ORAL_TABLET | Freq: Every day | ORAL | Status: AC
  Administered 2017-07-29: 100 mg via ORAL
  Filled 2017-07-29: qty 4

## 2017-07-29 MED ORDER — METOPROLOL TARTRATE 25 MG PO TABS
25.0000 mg | ORAL_TABLET | Freq: Two times a day (BID) | ORAL | Status: DC
  Administered 2017-07-29 – 2017-07-30 (×3): 25 mg via ORAL
  Filled 2017-07-29 (×3): qty 1

## 2017-07-29 NOTE — Progress Notes (Addendum)
Family Medicine Teaching Service Daily Progress Note Intern Pager: [phone]  Patient name: Tina Saunders Medical record number: [id number] Date of birth: [birth year] Age: 32 y.o. Gender: female  Primary Care Provider: Javier Docker, MD Consultants: Psych Code Status: Full  Pt Overview and Major Events to Date:  7/4 - pt admitted for attempted overdose  Assessment and Plan: Tina Saunders is a 32 y.o. female presenting with AMS after intentional overdose of Seroquel ER and buspirone. PMH is significant for prior suicide attempts, schizophrenia, bipolar disorder, and poorly controlled T2DM.  Intentional overdose:  Stable.  Seroquel and 20 pills of buspirone, reports previous attempts at overdosing.   Tylenol, ethanol, salicylate levels wnl.  UDS neg for all but barbituates (no positive but lab could not test).  Past poison control recommend  8-12 hours of cardiac monitoring. - psychiatry consulted, will IVC, recs appreciated - EKGs q4H. Monitor QT interval. - 501 Am 7/7 (has ranged from 485 to 517) - Neuro checks q4 hours for A&Os - Suicide precautions: IVC with sitter - Continue Chlorpromazine 50mg  PO TID, first dose 7/5 in AM - Continue Keppra 500mg  BID for mood stabilization - hold home gabapentin and topomax due to risk for increased sedation - Hold Seroquel, Buspirone and Trazodone due to concern for Serotonin syndrome  T2DM: 300s 7/7.  Takes Levemir 68 BID and Novolog at home. Her blood sugars normally run in the 400s. Patient has previous ED visits for hyperglycemia. - HbgA1c: 12.4 (7/4) - Insulin Aspart moderate sliding scale qACHS - starting Insulin glargine 34 BID - IV NS 125cc/hr maintenance  Hypocalcemia: corrected to 8.3 -TUMS bid and recheck in AM  Hypomagnesemia: resolved  Mild hyponatremia and hypochloremia, likely chronic given past measurements: Patient appears euvolemic.  Na 132, chloride 95. - NS @ 125 ml/hr  Hypertension: BP 151/95 mmHg in the ED.   Takes Lisinopril 10 mg daily at home. - Lisinopril 10mg  PO qDaily  HLD: patient has history of hyperlipidemia.  Last lipid panel with cholesterol 113, HDL 19, LDL 21, TG 366 in October 2018 - DCed Simvastatin 10mg  PO qDaily (7/5) - started atorvastatin 40 mg Q daily (7/5)  FEN/GI: carb-modified diet Prophylaxis: ambulation  Disposition: Medically stable for inpatient psych placement, at this point is willing to go voluntarily.   Subjective:  No pain, said she was in seroquel withdrawal.  Would prefer to walk over SCDs.  Still endorsing hallucinations and SI.  Agrees with plan to admit to inpatient psych.  Objective: Temp:  [97.9 F (36.6 C)-98.7 F (37.1 C)] 97.9 F (36.6 C) (07/07 0300) Pulse Rate:  [75-93] 80 (07/07 0300) Resp:  [16-22] 18 (07/07 0300) BP: (109-146)/(64-84) 114/79 (07/07 0300) SpO2:  [96 %-99 %] 96 % (07/07 0300) Physical Exam: Gen: flat affect, but pleasant and willing to converse, obese Eyes, EOMI, pupils equal Card: RRR Pulm: no IWB, CTAB, no wheezes/crackles Psych: still endorsing auditory hallucinations, voices asking her to hurt herself and SI.  Denies thoughts or intents on hurting others.  Laboratory: Recent Labs  Lab 07/26/17 2009 07/27/17 0518 07/28/17 0854  WBC 5.1 4.4 3.5*  HGB 12.2 11.4* 10.6*  HCT 39.4 37.2 34.8*  PLT 124* 112* 108*   Recent Labs  Lab 07/26/17 2009 07/27/17 0518 07/28/17 0854  NA 132* 134* 137  K 3.7 4.7 4.0  CL 95* 100 105  CO2 22 25 23   BUN <5* 6 6  CREATININE 0.51 0.63 0.43*  CALCIUM 9.2 8.9 7.7*  PROT 7.7 7.1 6.3*  BILITOT 0.7 0.6 0.7  ALKPHOS 106 101 80  ALT 25 24 24   AST 45* 35 47*  GLUCOSE 428* 470* 283*   Imaging/Diagnostic Tests:  Dg Chest 2 View  Result Date: 07/21/2017 CLINICAL DATA:  Patient reports cough X 2 months. EXAM: CHEST - 2 VIEW COMPARISON:  04/11/2017 FINDINGS: Lungs are clear. Heart size and mediastinal contours are within normal limits. Blunting of posterior costophrenic angle  since prior study suggesting tiny pleural effusions. No pneumothorax. Visualized bones unremarkable. IMPRESSION: Probable new tiny pleural effusions.  Otherwise negative. Electronically Signed   By: Lucrezia Europe M.D.   On: 07/21/2017 14:51     Sherene Sires, DO 07/29/2017, 7:22 AM PGY-2, [place] Intern pager: [phone], text pages welcome

## 2017-07-29 NOTE — Progress Notes (Signed)
Patient's belongings removed from the room. She has one bra, one shirt, one pair of jeans shorts and a pair of flip flops.

## 2017-07-29 NOTE — Consult Note (Signed)
Cardiology Consultation:   Patient ID: Tina Saunders; [id number]; 12-16-1985   Admit date: 07/26/2017 Date of Consult: 07/29/2017  Primary Care Provider: Javier Docker, MD Primary Cardiologist: New patient/  Dr Meda Coffee   Patient Profile:   Tina Saunders is a 32 y.o. female with a hx of schizophrenia, bipolar disorder, poorly controlled type 2 diabetes, insulin-dependent who was admitted after attempted overdose and suicide who is being seen today for the evaluation of cardiac monitoring for prolonged QT interval post overdose at the request of Sherene Sires, DO.  History of Present Illness:   Tina Saunders 32 year old female with prior medical history of schizophrenia bipolar disorder poorly controlled diabetes on long-term insulin, who was admitted after intentional overdose with Seroquel ER and Buspirone.  This was intentional overdose with attempt of suicide, she has history of multiple suicidal attempts in the past. She was admitted with acute mental status changes. Her labs are consistent with severely elevated blood sugar of 470, low magnesium of 1.6, potassium 4.1 normal sodium 138, normal creatinine of 1.35, hemoglobin 10.1, hemoglobin A1c of 12.4. Urine drug screen was positive for barbiturates, otherwise negative. EKG showed normal sinus rhythm, no ST-T wave abnormalities, ventricular rate 78 bpm, QT/QTc 440/501 ms, on admission 450/510 ms.  Previously QT QTc 376/480 ms. Her echocardiogram in 2013 showed normal LVEF and mildly dilated left atrium.  She is currently asymptomatic, denies any chest pain, SOB, no palpitations, no syncope.   Past Medical History:  Diagnosis Date  . Anxiety   . Asthma   . Bipolar 1 disorder ([place])   . Cancer of abdominal wall   . Depression   . Diabetes mellitus without complication ([place])   . High cholesterol   . Hypertension   . Intentional drug overdose ([place]) 07/26/2017   Seroquel and buspirone   . Obesity   . Obesity   . Polycystic ovarian  syndrome 07/01/2011   Patient report  . 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 Schizophrenia ([place])   . Seizures ([place])     Past Surgical History:  Procedure Laterality Date  . CHOLECYSTECTOMY    . COLONOSCOPY WITH PROPOFOL N/A 03/08/2017   Procedure: COLONOSCOPY WITH PROPOFOL;  Surgeon: Milus Banister, MD;  Location: [facility] ENDOSCOPY;  Service: Endoscopy;  Laterality: N/A;  . HERNIA REPAIR    . Ovarian Cyst Excision    . VARICOSE VEIN SURGERY      Inpatient Medications: Scheduled Meds: . atorvastatin  40 mg Oral QHS  . calcium carbonate  2 tablet Oral BID  . chlorproMAZINE  50 mg Oral TID  . insulin aspart  0-15 Units Subcutaneous TID WC  . insulin aspart  0-5 Units Subcutaneous QHS  . insulin glargine  34 Units Subcutaneous BID  . levETIRAcetam  500 mg Oral BID  . lisinopril  10 mg Oral Daily  . Melatonin  4.5 mg Oral QHS   Continuous Infusions: . sodium chloride 125 mL/hr at 07/29/17 1139  . magnesium sulfate 1 - 4 g bolus IVPB 2 g (07/29/17 1037)   PRN Meds: acetaminophen  Allergies:    Allergies  Allergen Reactions  . Fish-Derived Products Anaphylaxis    Can only eat FLounder  . Geodon [Ziprasidone Hcl] Other (See Comments)    Face pulls, cant swallow - Locked Jaw  . Haldol [Haloperidol Lactate] Other (See Comments)    Face pulls, can't swallow - Locked Jaw  . Buprenorphine Hcl Hives, Itching, Rash and Other (See Comments)    GI upset  . Compazine [  Prochlorperazine] Other (See Comments)    anxiety and hyperactivity  . Morphine And Related Hives, Itching, Rash and Other (See Comments)    GI upset  . Toradol [Ketorolac Tromethamine] Other (See Comments)    Anxiety and hyperactivity    Social History:   Social History   Socioeconomic History  . Marital status: Single    Spouse name: Not on file  . Number of children: 0  . Years of education: Not on file  . Highest education level: Not on file  Occupational History  . Not on file  Social Needs  . Financial resource  strain: Not on file  . Food insecurity:    Worry: Not on file    Inability: Not on file  . Transportation needs:    Medical: Not on file    Non-medical: Not on file  Tobacco Use  . Smoking status: Never Smoker  . Smokeless tobacco: Never Used  Substance and Sexual Activity  . Alcohol use: No  . Drug use: No  . Sexual activity: Never    Birth control/protection: None  Lifestyle  . Physical activity:    Days per week: Not on file    Minutes per session: Not on file  . Stress: Not on file  Relationships  . Social connections:    Talks on phone: Not on file    Gets together: Not on file    Attends religious service: Not on file    Active member of club or organization: Not on file    Attends meetings of clubs or organizations: Not on file    Relationship status: Not on file  . Intimate partner violence:    Fear of current or ex partner: Not on file    Emotionally abused: Not on file    Physically abused: Not on file    Forced sexual activity: Not on file  Other Topics Concern  . Not on file  Social History Narrative  . Not on file    Family History:    Family History  Problem Relation Age of Onset  . Coronary artery disease Maternal Grandmother   . Diabetes type II Maternal Grandmother   . Cancer Maternal Grandmother   . Hypertension Mother   . Hypertension Father      ROS:  Please see the history of present illness.  All other ROS reviewed and negative.     Physical Exam/Data:   Vitals:   07/28/17 1905 07/28/17 2300 07/29/17 0300 07/29/17 1132  BP: (!) 146/84 112/67 114/79 (!) 153/91  Pulse: 93 85 80 79  Resp: 20 (!) 22 18 16   Temp: 98.7 F (37.1 C) 98.2 F (36.8 C) 97.9 F (36.6 C) 98 F (36.7 C)  TempSrc: Oral Oral Axillary Oral  SpO2:   96% 99%  Weight:      Height:        Intake/Output Summary (Last 24 hours) at 07/29/2017 1145 Last data filed at 07/29/2017 1100 Gross per 24 hour  Intake 4662.51 ml  Output -  Net 4662.51 ml   Filed Weights    07/26/17 1943 07/27/17 0430 07/28/17 0720  Weight: 226 lb (102.5 kg) 213 lb (96.6 kg) 221 lb 3.2 oz (100.3 kg)   Body mass index is 37.97 kg/m.  General:  Well nourished, well developed, in no acute distress HEENT: normal Lymph: no adenopathy Neck: no JVD Endocrine:  No thryomegaly Vascular: No carotid bruits; FA pulses 2+ bilaterally without bruits  Cardiac:  normal S1, S2; RRR; no  murmur Lungs:  clear to auscultation bilaterally, no wheezing, rhonchi or rales  Abd: soft, nontender, no hepatomegaly  Ext: no edema Musculoskeletal:  No deformities, BUE and BLE strength normal and equal Skin: warm and dry  Neuro:  CNs 2-12 intact, no focal abnormalities noted Psych:  Normal affect   EKG:  The EKG was personally reviewed and demonstrates:  SR, no ST T wave abnormalities, QT/QTc today 440/500 ms Telemetry:  Telemetry was personally reviewed and demonstrates:  No telemetry  Relevant CV Studies:   Laboratory Data:  Chemistry Recent Labs  Lab 07/27/17 0518 07/28/17 0854 07/29/17 0726  NA 134* 137 138  K 4.7 4.0 4.1  CL 100 105 109  CO2 25 23 24   GLUCOSE 470* 283* 242*  BUN 6 6 <5*  CREATININE 0.63 0.43* 0.35*  CALCIUM 8.9 7.7* 7.5*  GFRNONAA >60 >60 >60  GFRAA >60 >60 >60  ANIONGAP 9 9 5     Recent Labs  Lab 07/27/17 0518 07/28/17 0854 07/29/17 0726  PROT 7.1 6.3* 5.9*  ALBUMIN 3.7 3.2* 3.0*  AST 35 47* 33  ALT 24 24 21   ALKPHOS 101 80 79  BILITOT 0.6 0.7 0.6   Hematology Recent Labs  Lab 07/27/17 0518 07/28/17 0854 07/29/17 0726  WBC 4.4 3.5* 2.7*  RBC 4.57 4.20 3.98  HGB 11.4* 10.6* 10.1*  HCT 37.2 34.8* 32.5*  MCV 81.4 82.9 81.7  MCH 24.9* 25.2* 25.4*  MCHC 30.6 30.5 31.1  RDW 15.5 15.6* 15.0  PLT 112* 108* 95*   Cardiac EnzymesNo results for input(s): TROPONINI in the last 168 hours. No results for input(s): TROPIPOC in the last 168 hours.  BNPNo results for input(s): BNP, PROBNP in the last 168 hours.  DDimer No results for input(s): DDIMER in  the last 168 hours.  Radiology/Studies:  No results found.    Assessment and Plan:   1. Prolonged QT in the settings of overdose with Seroquel ER (known for QT prolongation) and Buspirone -Start metoprolol 25 mg p.o. twice daily -Replace magnesium -No arrhythmias on telemetry -per psychiatry continue to hold Seroquel and Buspar. Zoloft and Trazodone should be discontinued due to risk for serotonin syndrome.  -Continue Thorazine 50 mg TID for mood stabilization/agitation thorazine is also known for QT prolongation, we will ask psych to consider an alternative  2.  Hypertension -Continue lisinopril 10 mg daily -Start metoprolol 25 mg p.o. twice daily  3.  Hyperlipidemia -Continue atorvastatin in the setting of poorly controlled diabetes  For questions or updates, please contact [place] Please consult [email or web address] for contact info under Cardiology/STEMI.   Signed, Ena Dawley, MD  07/29/2017 11:45 AM

## 2017-07-30 DIAGNOSIS — F4325 Adjustment disorder with mixed disturbance of emotions and conduct: Secondary | ICD-10-CM

## 2017-07-30 LAB — GLUCOSE, CAPILLARY
GLUCOSE-CAPILLARY: 250 mg/dL — AB (ref 70–99)
GLUCOSE-CAPILLARY: 275 mg/dL — AB (ref 70–99)
Glucose-Capillary: 261 mg/dL — ABNORMAL HIGH (ref 70–99)
Glucose-Capillary: 302 mg/dL — ABNORMAL HIGH (ref 70–99)
Glucose-Capillary: 277 mg/dL — ABNORMAL HIGH (ref 70–99)

## 2017-07-30 LAB — COMPREHENSIVE METABOLIC PANEL
ALT: 21 U/L (ref 0–44)
ANION GAP: 6 (ref 5–15)
AST: 31 U/L (ref 15–41)
Albumin: 3.2 g/dL — ABNORMAL LOW (ref 3.5–5.0)
Alkaline Phosphatase: 100 U/L (ref 38–126)
BUN: <5 mg/dL — ABNORMAL LOW (ref 6–20)
CALCIUM: 8.5 mg/dL — AB (ref 8.9–10.3)
CHLORIDE: 107 mmol/L (ref 98–111)
CO2: 24 mmol/L (ref 22–32)
Creatinine, Ser: 0.38 mg/dL — ABNORMAL LOW (ref 0.44–1.00)
GFR calc non Af Amer: >60 mL/min (ref >60)
GLUCOSE: 276 mg/dL — AB (ref 70–99)
POTASSIUM: 4 mmol/L (ref 3.5–5.1)
SODIUM: 137 mmol/L (ref 135–145)
Total Bilirubin: 0.7 mg/dL (ref 0.3–1.2)
Total Protein: 6.2 g/dL — ABNORMAL LOW (ref 6.5–8.1)

## 2017-07-30 LAB — CBC
HCT: 32.7 % — ABNORMAL LOW (ref 36.0–46.0)
HEMOGLOBIN: 10.1 g/dL — AB (ref 12.0–15.0)
MCH: 25.1 pg — AB (ref 26.0–34.0)
MCHC: 30.9 g/dL (ref 30.0–36.0)
MCV: 81.1 fL (ref 78.0–100.0)
PLATELETS: 102 10*3/uL — AB (ref 150–400)
RBC: 4.03 MIL/uL (ref 3.87–5.11)
RDW: 15.2 % (ref 11.5–15.5)
WBC: 3 10*3/uL — ABNORMAL LOW (ref 4.0–10.5)

## 2017-07-30 LAB — MAGNESIUM: MAGNESIUM: 1.6 mg/dL — AB (ref 1.7–2.4)

## 2017-07-30 MED ORDER — ATORVASTATIN CALCIUM 40 MG PO TABS: 40.0000 mg | ORAL_TABLET | Freq: Every day | ORAL | 0 refills | Status: DC

## 2017-07-30 MED ORDER — MAGNESIUM SULFATE 2 GM/50ML IV SOLN: 2.0000 g | Freq: Every day | INTRAVENOUS | Status: DC

## 2017-07-30 MED ORDER — METOPROLOL TARTRATE 25 MG PO TABS: 25.0000 mg | ORAL_TABLET | Freq: Two times a day (BID) | ORAL | 0 refills | Status: DC

## 2017-07-30 MED ORDER — INSULIN GLARGINE 100 UNIT/ML ~~LOC~~ SOLN
40.0000 [IU] | Freq: Two times a day (BID) | SUBCUTANEOUS | Status: DC
  Filled 2017-07-30: qty 0.4

## 2017-07-30 MED ORDER — MAGNESIUM 500 MG PO TABS: 500.0000 mg | ORAL_TABLET | Freq: Every day | ORAL | 0 refills | Status: DC

## 2017-07-30 MED ORDER — BUSPIRONE HCL 15 MG PO TABS: 15.0000 mg | ORAL_TABLET | Freq: Three times a day (TID) | ORAL | 0 refills | Status: DC

## 2017-07-30 NOTE — Discharge Instructions (Signed)
Suicidal Feelings: How to Help Yourself  Suicide is the taking of one's own life. If you feel as though life is getting too tough to handle and are thinking about suicide, get help right away. To get help:  Call your local emergency services (911 in the [place]  Call a suicide hotline to speak with a trained counselor who understands how you are feeling. The following is a list of suicide hotlines in the [place]. For a list of hotlines in [place], visit www.suicide.org/hotlines/international/[place]-suicide-hotlines.html.  1-800-273-TALK (1-800-273-8255).  1-800-SUICIDE (1-800-784-2433).  1-888-628-9454. This is a hotline for Spanish speakers.  1-800-799-4TTY (1-800-799-4889). This is a hotline for TTY users.  1-866-4-U-TREVOR (1-866-488-7386). This is a hotline for lesbian, gay, bisexual, transgender, or questioning youth.  Contact a crisis center or a local suicide prevention center. To find a crisis center or suicide prevention center:  Call your local hospital, clinic, community service organization, mental health center, social service provider, or health department. Ask for assistance in connecting to a crisis center.  Visit www.suicidepreventionlifeline.org/getinvolved/locator for a list of crisis centers in the [place], or visit www.suicideprevention.ca/thinking-about-suicide/find-a-crisis-centre for a list of centers in [place].  Visit the following websites:  National Suicide Prevention Lifeline: www.suicidepreventionlifeline.org  Hopeline: www.hopeline.com  American Foundation for Suicide Prevention: www.afsp.org  The Trevor Project (for lesbian, gay, bisexual, transgender, or questioning youth): www.thetrevorproject.org    How can I help myself feel better?  Promise yourself that you will not do anything drastic when you have suicidal feelings. Remember, there is hope. Many people have gotten through suicidal thoughts and feelings, and you will, too. You may have gotten through them before, and  this proves that you can get through them again.  Let family, friends, teachers, or counselors know how you are feeling. Try not to isolate yourself from those who care about you. Remember, they will want to help you. Talk with someone every day, even if you do not feel sociable. Face-to-face conversation is best.  Call a mental health professional and see one regularly.  Visit your primary health care provider every year.  Eat a well-balanced diet, and space your meals so you eat regularly.  Get plenty of rest.  Avoid alcohol and drugs, and remove them from your home. They will only make you feel worse.  If you are thinking of taking a lot of medicine, give your medicine to someone who can give it to you one day at a time. If you are on antidepressants and are concerned you will overdose, let your health care provider know so he or she can give you safer medicines. Ask your mental health professional about the possible side effects of any medicines you are taking.  Remove weapons, poisons, knives, and anything else that could harm you from your home.  Try to stick to routines. Follow a schedule every day. Put self-care on your schedule.  Make a list of realistic goals, and cross them off when you achieve them. Accomplishments give a sense of worth.  Wait until you are feeling better before doing the things you find difficult or unpleasant.  Exercise if you are able. You will feel better if you exercise for even a half hour each day.  Go out in the sun or into nature. This will help you recover from depression faster. If you have a favorite place to walk, go there.  Do the things that have always given you pleasure. Play your favorite music, read a good book, paint a picture, play your favorite   instrument, or do anything else that takes your mind off your depression if it is safe to do.  Keep your living space well lit.  When you are feeling well, write yourself a letter about tips and support that you can read when  you are not feeling well.  Remember that life's difficulties can be sorted out with help. Conditions can be treated. You can work on thoughts and strategies that serve you well.  This information is not intended to replace advice given to you by your health care provider. Make sure you discuss any questions you have with your health care provider.  Document Released: 07/16/2002 Document Revised: 09/08/2015 Document Reviewed: 05/06/2013  [employer]vier Interactive Patient Education  2018 [employer]

## 2017-07-30 NOTE — Progress Notes (Signed)
Inpatient Diabetes Program Recommendations  AACE/ADA: New Consensus Statement on Inpatient Glycemic Control (2015)  Target Ranges:  Prepandial:   less than 140 mg/dL      Peak postprandial:   less than 180 mg/dL (1-2 hours)      Critically ill patients:  140 - 180 mg/dL   Lab Results  Component Value Date   GLUCAP 250 (H) 07/30/2017   HGBA1C 12.4 (H) 07/26/2017    Review of Glycemic Control Results for RASHIA, MCKESSON (MRN [id number]) as of 07/30/2017 10:43  Ref. Range 07/29/2017 21:17 07/30/2017 00:41 07/30/2017 04:08 07/30/2017 07:48  Glucose-Capillary Latest Ref Range: 70 - 99 mg/dL 309 (H) 275 (H) 261 (H) 250 (H)   Diabetes history: DM2 Outpatient Diabetes medications: Lantus 34 units QD, Novolog 0-15 units tidwc and hs Current orders for Inpatient glycemic control: Levemir 34 units QD, Novolog 0-15 units tidwc and hs  Inpatient Diabetes Program Recommendations:    This is patient's 3rd admission in last 6 months.   Patient's home dose of basal is 68 units. AM FSBG was 250 mg/dL, consider increasing Lantus to 45 units QD.  Thanks,  Bronson Curb, MSN, RNC-OB Diabetes Coordinator [phone] (8a-5p)

## 2017-07-30 NOTE — Progress Notes (Signed)
Family Medicine Teaching Service Daily Progress Note Intern Pager: [phone]  Patient name: Tina Saunders Medical record number: [id number] Date of birth: 03/10/1985 Age: 32 y.o. Gender: female  Primary Care Provider: Javier Docker, MD Consultants: Psych, Cardio Code Status: Full  Pt Overview and Major Events to Date:  7/4 - pt admitted for attempted overdose  Assessment and Plan: Intentional overdose, medically stable for discharge to inpatient psych facility: - Suicide precautions: IVC with sitter - Psychiatry consulted - appreciate input about psych med regimen; all of patient psych meds were held except for Thorazine and Keppra; patient is complaining of audio/visual hallucinations and anxiety - Continue Chlorpromazine 50mg  PO TID - Continue Keppra 500mg  BID for mood stabilization - hold home gabapentin and topomax due to risk for increased sedation  QT Prolongation, stable:  - replete Mag, avoid QT-prolonging meds  T2DM: Takes Levemir and Novolog at home, although she did not takethesetoday. Her blood sugars normally run in the 400s. Last A1c 12/4 (7/4). Patient has previous ED visits forhyperglycemia. - Insulin Aspart moderate sliding scale qACHS - Insulin glargine 40 BID  Pancytopenia: stable, possible side effect of medication - Monitor with daily CBC  Mild hyponatremia and hypochloremia, likely chronic given past measurements: Patient appears euvolemic. Na 137, chloride 107 (7/8). - monitor labs  Hypertension, stable: BP 108/60 (7/8) - Lisinopril 10mg  PO qDaily  HLD: patient has history ofhyperlipidemia. Last lipid panel with cholesterol 113, HDL 19, LDL 21, TG 366 in October 2018 - continue atorvastatin 40 mg Q daily (7/5)  FEN/GI:carb-modified diet, thin fluid Prophylaxis:ambulation  Disposition: Patient medically cleared for discharge to inpatient psych.  Subjective:  Tina Saunders is a 32 year old female with PMH of uncontrolled T2DM,  Schizophrenia, Bipolar Disorder, HLD, and previous Suicide Attempts admitted for ingestion of Seroquel and BuSpar. Patient is seen today ambulating the hallway with her sitter. She is in good spirits, talkative, and smiling. She reports having a right-sided frontal headache, which she has had before, and is usually responsive to Children's Tylenol. Otherwise she denies fevers, vision changes, nasal discharge, lacrimation, chest pain, heart fluttering, shortness of breath, nausea, vomiting, diarrhea, weakness, loss of balance, and audio/visual hallucinations.  Objective: Temp:  [98 F (36.7 C)-98.7 F (37.1 C)] 98.6 F (37 C) (07/08 0520) Pulse Rate:  [75-80] 75 (07/08 0520) Resp:  [16-18] 18 (07/08 0520) BP: (108-153)/(60-91) 108/60 (07/08 0520) SpO2:  [94 %-99 %] 94 % (07/08 0520) Weight:  [219 lb 12.8 oz (99.7 kg)] 219 lb 12.8 oz (99.7 kg) (07/08 0553) Physical Exam  Constitutional: She appears well-developed and well-nourished.  HENT:  Head: Normocephalic and atraumatic.  Eyes: EOM are normal.  Neck: Normal range of motion.  Cardiovascular: Normal rate, regular rhythm and normal heart sounds.  Pulmonary/Chest: Effort normal.  Abdominal: Soft. Bowel sounds are normal.  Obese abdomen  Musculoskeletal: Normal range of motion.  Neurological: She is alert.  Skin: Skin is warm and dry. Capillary refill takes less than 2 seconds.  Psychiatric: She has a normal mood and affect. Her behavior is normal.    Laboratory: Recent Labs  Lab 07/28/17 0854 07/29/17 0726 07/30/17 0452  WBC 3.5* 2.7* 3.0*  HGB 10.6* 10.1* 10.1*  HCT 34.8* 32.5* 32.7*  PLT 108* 95* 102*   Recent Labs  Lab 07/28/17 0854 07/29/17 0726 07/30/17 0452  NA 137 138 137  K 4.0 4.1 4.0  CL 105 109 107  CO2 23 24 24   BUN 6 <5* <5*  CREATININE 0.43* 0.35* 0.38*  CALCIUM 7.7*  7.5* 8.5*  PROT 6.3* 5.9* 6.2*  BILITOT 0.7 0.6 0.7  ALKPHOS 80 79 100  ALT 24 21 21   AST 47* 33 31  GLUCOSE 283* 242* 276*     [address], DO 07/30/2017, 7:48 AM PGY-1, [place] Intern pager: [phone], text pages welcome

## 2017-07-30 NOTE — Clinical Social Work Note (Signed)
Clinical Social Work Assessment  Patient Details  Name: Tina Saunders MRN: [id number] Date of Birth: 1985-02-04  Date of referral:  07/30/17               Reason for consult:  Facility Placement, Suicide Risk/Attempt, Discharge Planning                Permission sought to share information with:  Facility Art therapist granted to share information::  Yes, Verbal Permission Granted  Name::        Agency::  Inpatient psych facilities  Relationship::     Contact Information:     Housing/Transportation Living arrangements for the past 2 months:  Apartment Source of Information:  Patient, Medical Team Patient Interpreter Needed:  None Criminal Activity/Legal Involvement Pertinent to Current Situation/Hospitalization:  No - Comment as needed Significant Relationships:  Siblings, Parents Lives with:    Do you feel safe going back to the place where you live?  Yes Need for family participation in patient care:  Yes (Comment)  Care giving concerns:  Inpatient psych admission.   Social Worker assessment / plan:  CSW met with patient. Sitter at bedside. CSW introduced role. Patient aware of recommendation for inpatient psych admission. Patient confirmed she will go voluntarily and has signed voluntary consent form. CSW made referral to [place] at [facility]. She will review referral and call CSW back. No further concerns. CSW encouraged patient to contact CSW as needed. CSW will continue to follow patient for support and facilitate discharge to inpatient psych facility once bed available.  Employment status:  Disabled (Comment on whether or not currently receiving Disability) Insurance information:  Managed Medicare PT Recommendations:  Not assessed at this time Information / Referral to community resources:  Inpatient Psychiatric Care (Comment Required)  Patient/Family's Response to care:  Patient agreeable to inpatient psych admission. Patient's family supportive and involved  in patient's care. Patient appreciated social work intervention.  Patient/Family's Understanding of and Emotional Response to Diagnosis, Current Treatment, and Prognosis:  Patient has a good understanding of the reason for admission and plan for inpatient psych admission. Patient appears pleased with hospital care.  Emotional Assessment Appearance:  Appears stated age Attitude/Demeanor/Rapport:  Other(Depressed) Affect (typically observed):  Accepting, Depressed, Pleasant Orientation:  Oriented to Self, Oriented to Place, Oriented to  Time, Oriented to Situation Alcohol / Substance use:  Never Used Psych involvement (Current and /or in the community):  No (Comment)  Discharge Needs  Concerns to be addressed:  Care Coordination Readmission within the last 30 days:  No Current discharge risk:  Psychiatric Illness Barriers to Discharge:  Other(Psych facilities reviewing referrals)   Candie Chroman, LCSW 07/30/2017, 1:55 PM

## 2017-07-30 NOTE — Clinical Social Work Note (Addendum)
Per Ria Comment at [facility], based on patient's history she definitely needs to be on a locked unit and they do not have beds on that hall but she will follow up tomorrow. CSW called [facility] and spoke to Craig. She is currently with a patient but will call back to take referral information.  Dayton Scrape, CSW [phone]  4:21 pm CSW called [facility] again and made referral.  Dayton Scrape, [place]

## 2017-07-30 NOTE — Discharge Summary (Signed)
[facility] Discharge Summary  Patient name: Tina Saunders Medical record number: [id number] Date of birth: 11-Jul-1985 Age: 32 y.o. Gender: female Date of Admission: 07/26/2017  Date of Discharge: 07/30/2017 Admitting Physician: Tamala Julian, NP  Primary Care Provider: Javier Docker, MD Consultants: Psychiatry, Cardiology  Indication for Hospitalization: Suicide attempt  Discharge Diagnoses/Problem List:  - Suicide Attempt via Overdose of Antidepressant and Mood Stabilizer - uncontrolled T2DM - QT Prolongation - Schizophrenia - Bipolar Disorder - Depression - Pancytopenia  Disposition: discharge home with close outpatient psychiatric follow up  Discharge Condition: Stable  Discharge Exam: Constitutional: She appears well-developed and well-nourished.  HENT:  Head: Normocephalic and atraumatic.  Eyes: EOM are normal.  Neck: Normal range of motion.  Cardiovascular: Normal rate, regular rhythm and normal heart sounds.  Pulmonary/Chest: Effort normal.  Abdominal: Soft. Bowel sounds are normal.  Obese abdomen  Musculoskeletal: Normal range of motion.  Neurological: She is alert.  Skin: Skin is warm and dry. Capillary refill takes less than 2 seconds.  Psychiatric: She has a normal mood and affect. Her behavior is normal.    Brief Hospital Course:  Tina Saunders is a 32 year old female who presented to the ED on 07/26/2017 after hearing a voice that told her to overdose on 20 Buspar and 3 Seroquel. She then called an ambulance and came to the hospital. Poison Control was contacted and recommended monitoring her for EKG and neurological changes for 12 hours.   She was admitted as IVC with a sitter. Patient was often seen shaking her whole body, or an extremity, in her bed. During these episodes she remained alert and responsive, without loss of continence or postictal symptoms. Psychiatry was consulted due to suicidal ideation on presentation,  and cardiology was consulted regarding patient's QT prolongation. Psychiatry and Cardiology recommended holding the patient's psych meds to prevent Serotonin Syndrome and QT prolongation. EKGs throughout her visit showed elevated but stable QTc prolongation around 562ms, but this is a posssible side effect from her regular psychiatric meds. Patient's blood glucose was monitored and remained between 200-450mg /dL. Pancytopenia was noted on admission, but remained stable throughout hospital stay.   Of note, the patient was admitted on 07/06/2017 for Thorazine overdose. In neither case did the patient appear altered or drowsy, making the diagnosis of an overdose unlikely. The patient's sister did not believe she overdosed since no pills were missing from home. Psychiatry believes the patient's symptoms are for secondary gain, obtaining companionship and attention in the hospital. The patient's sister agrees to monitor for patient's safety with close follow up with outpatient provider, Social Work, and ACT Team. Patient was discharged on 07/30/2017 to home on Buspar, Thorazine, and Zoloft.  Issues for Follow Up:  1. Patient would benefit from outpatient psychiatric follow-up. 2. Follow up with PCP for better control of T2DM 3. The patient should actively participate with ACT team activities.   Significant Procedures: none  Significant Labs and Imaging:  Recent Labs  Lab 07/28/17 0854 07/29/17 0726 07/30/17 0452  WBC 3.5* 2.7* 3.0*  HGB 10.6* 10.1* 10.1*  HCT 34.8* 32.5* 32.7*  PLT 108* 95* 102*   Recent Labs  Lab 07/26/17 1952  07/26/17 2009 07/27/17 0518 07/28/17 0854 07/29/17 0726 07/30/17 0452  NA  --   --  132* 134* 137 138 137  K  --    < > 3.7 4.7 4.0 4.1 4.0  CL  --   --  95* 100 105 109 107  CO2  --   --  22 25 23 24 24   GLUCOSE  --   --  428* 470* 283* 242* 276*  BUN  --   --  <5* 6 6 <5* <5*  CREATININE  --   --  0.51 0.63 0.43* 0.35* 0.38*  CALCIUM  --   --  9.2 8.9 7.7* 7.5*  8.5*  MG 1.6*  --   --   --  1.6* 1.8 1.6*  ALKPHOS  --   --  106 101 80 79 100  AST  --   --  45* 35 47* 33 31  ALT  --   --  25 24 24 21 21   ALBUMIN  --   --  3.9 3.7 3.2* 3.0* 3.2*   < > = values in this interval not displayed.   Dg Chest 2 View  Result Date: 07/21/2017 CLINICAL DATA:  Patient reports cough X 2 months. EXAM: CHEST - 2 VIEW COMPARISON:  04/11/2017 FINDINGS: Lungs are clear. Heart size and mediastinal contours are within normal limits. Blunting of posterior costophrenic angle since prior study suggesting tiny pleural effusions. No pneumothorax. Visualized bones unremarkable. IMPRESSION: Probable new tiny pleural effusions.  Otherwise negative. Electronically Signed   By: Lucrezia Europe M.D.   On: 07/21/2017 14:51    Results/Tests Pending at Time of Discharge: none  Discharge Medications:  Allergies as of 07/30/2017      Reactions   Fish-derived Products Anaphylaxis   Can only eat FLounder   Geodon [ziprasidone Hcl] Other (See Comments)   Face pulls, cant swallow - Locked Jaw   Haldol [haloperidol Lactate] Other (See Comments)   Face pulls, can't swallow - Locked Jaw   Buprenorphine Hcl Hives, Itching, Rash, Other (See Comments)   GI upset   Compazine [prochlorperazine] Other (See Comments)   anxiety and hyperactivity   Morphine And Related Hives, Itching, Rash, Other (See Comments)   GI upset   Toradol [ketorolac Tromethamine] Other (See Comments)   Anxiety and hyperactivity      Medication List    STOP taking these medications   dicyclomine 20 MG tablet Commonly known as:  BENTYL   QUEtiapine 100 MG tablet Commonly known as:  SEROQUEL   QUEtiapine 200 MG 24 hr tablet Commonly known as:  SEROQUEL XR   simvastatin 10 MG tablet Commonly known as:  ZOCOR   traZODone 50 MG tablet Commonly known as:  DESYREL     TAKE these medications   atorvastatin 40 MG tablet Commonly known as:  LIPITOR Take 1 tablet (40 mg total) by mouth at bedtime.   busPIRone  15 MG tablet Commonly known as:  BUSPAR Take 1 tablet (15 mg total) by mouth 3 (three) times daily. What changed:    medication strength  how much to take   chlorproMAZINE 50 MG tablet Commonly known as:  THORAZINE Take 1 tablet (50 mg total) by mouth 3 (three) times daily. For agitation/mood control   gabapentin 400 MG capsule Commonly known as:  NEURONTIN Take 1 capsule (400 mg total) by mouth 3 (three) times daily. For agitation/diabetic neuropathy   insulin aspart 100 UNIT/ML injection Commonly known as:  novoLOG Inject 6 Units into the skin 3 (three) times daily with meals. For diabetes management   insulin detemir 100 UNIT/ML injection Commonly known as:  LEVEMIR Inject 0.68 mLs (68 Units total) into the skin 2 (two) times daily. For diabetes management   Insulin Syringe-Needle U-100 25G X 1" 1 ML Misc 1 Syringe by Does not apply route  4 (four) times daily -  before meals and at bedtime.   levETIRAcetam 500 MG tablet Commonly known as:  KEPPRA Take 1 tablet (500 mg total) by mouth 2 (two) times daily. For mood stabilization   lisinopril 10 MG tablet Commonly known as:  PRINIVIL,ZESTRIL Take 1 tablet (10 mg total) by mouth daily. For high blood pressure   Magnesium 500 MG Tabs Take 1 tablet (500 mg total) by mouth daily.   Melatonin 5 MG Tabs Take 5 mg by mouth at bedtime.   metoprolol tartrate 25 MG tablet Commonly known as:  LOPRESSOR Take 1 tablet (25 mg total) by mouth 2 (two) times daily.   sertraline 100 MG tablet Commonly known as:  ZOLOFT Take 200 mg by mouth daily.   topiramate 100 MG tablet Commonly known as:  TOPAMAX Take 100 mg by mouth 2 (two) times daily.       Discharge Instructions: Please refer to Patient Instructions section of EMR for full details.  Patient was counseled important signs and symptoms that should prompt return to medical care, changes in medications, dietary instructions, activity restrictions, and follow up appointments.    Follow-Up Appointments: Follow-up Information    Pavelock, Ralene Bathe, MD. Schedule an appointment as soon as possible for a visit.   Specialty:  Internal Medicine Contact information: [address] [place] [address] Wilhoit, B[place], DO 07/30/2017, 5:45 PM PGY-1, [place]

## 2017-07-30 NOTE — Consult Note (Signed)
[facility] Psych Consult Progress Note  07/30/2017 12:04 PM MAZI SCHUFF  MRN:  [id number] Subjective:   Tina Saunders was last seen by the psychiatry consult service on 7/5 for suicide attempt by Seroquel and Buspar overdose secondary to [facility] telling her to harm self. Psychiatry is reconsulted for medication management in the setting of prolonged QTc (501 yesterday and decreased from 517 on 7/6). Thorazine 50 mg TID was continued and Buspar, Seroquel, Trazodone and Zoloft were held due to risk of serotonin syndrome.   On interview, Tina Saunders reports that her mood is "okay."  She reports that being in the hospital has helped to improve her mood and that everyone has been great.  She denies SI, HI or AVH.  She denies any problems with her sleep or appetite.  She reports that she received Seroquel last night for sleep.  Trazodone also helps with sleep.  She would like to follow up with the ACT team and reports that her sister plans to stay with her at discharge.  She would like to discharge home.  She does not have access to guns or weapons at home.  She provides verbal consent to speak to her sister, Bernadene Bell.  Her sister reports that she does not believe that she overdosed on her medications as her medications were stored in her drawer and she did not appear to have any missing pills.  She believes that the patient says that she is suicidal for attention.  She believes that she needs activities to occupy her time so that she does not feel so lonely.  She has no concerns for safety and is willing to stay with her upon discharge.  Principal Problem: Adjustment disorder with mixed disturbance of emotions and conduct Diagnosis:   Patient Active Problem List   Diagnosis Date Noted  . QT prolongation [R94.31]   . Suicide attempt ([place]) [T14.91XA]   . Overdose of antidepressant, intentional self-harm, initial encounter ([place]) [T43.202A]   . Overdose [T50.901A] 07/26/2017  . Suicide attempt by other psychotropic drug  overdose ([place]) [T43.8X2A]   . DKA (diabetic ketoacidoses) ([place]) [E13.10] 03/14/2017  . Chronic constipation [K59.09]   . Seizures ([place]) [R56.9] 12/11/2016  . DM2 (diabetes mellitus, type 2) ([place]) [E11.9] 12/11/2016  . Uncontrolled diabetes mellitus ([place]) [E11.65] 11/03/2016  . Schizoaffective disorder, bipolar type ([place]) [F25.0] 11/02/2016  . 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 Suicidal ideation [R45.851]   . 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 HTN (hypertension) [I10] 04/15/2013  . Post traumatic stress disorder [F43.10] 12/07/2011  . PSVT (paroxysmal supraventricular tachycardia) ([place]) [I47.1] 08/26/2011  . ADHD [F90.9] 09/23/2007  . EPIGASTRIC PAIN [R10.13] 09/23/2007  . Obesity, unspecified [E66.9] 07/30/2007  . Depression [F32.9] 07/30/2007  . SLEEP DISORDER [G47.9] 07/30/2007  . METRORRHAGIA [N92.1] 06/13/2006  . DISORDER, MENSTRUAL NEC [N94.9] 06/13/2006  . POLYCYSTIC OVARIAN DISEASE [E28.2] 04/25/2006  . AMENORRHEA, SECONDARY [N91.2] 04/20/2006  . ACNE, MILD [L70.8] 04/20/2006   Total Time spent with patient: 15 minutes  Past Psychiatric History: Schizophrenia, cluster b personality disorder depression, bipolar disorder and anxiety.   Past Medical History:  Past Medical History:  Diagnosis Date  . Anxiety   . Asthma   . Bipolar 1 disorder ([place])   . Cancer of abdominal wall   . Depression   . Diabetes mellitus without complication ([place])   . High cholesterol   . Hypertension   . Intentional drug overdose ([place]) 07/26/2017   Seroquel and buspirone   .  Obesity   . Obesity   . Polycystic ovarian syndrome 07/01/2011   Patient report  . 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 Schizophrenia ([place])   . Seizures ([place])     Past Surgical History:  Procedure Laterality Date  . CHOLECYSTECTOMY    . COLONOSCOPY WITH PROPOFOL N/A 03/08/2017   Procedure: COLONOSCOPY WITH PROPOFOL;  Surgeon: Milus Banister, MD;  Location: [facility] ENDOSCOPY;  Service: Endoscopy;   Laterality: N/A;  . HERNIA REPAIR    . Ovarian Cyst Excision    . VARICOSE VEIN SURGERY     Family History:  Family History  Problem Relation Age of Onset  . Coronary artery disease Maternal Grandmother   . Diabetes type II Maternal Grandmother   . Cancer Maternal Grandmother   . Hypertension Mother   . Hypertension Father    Family Psychiatric  History: Unknown  Social History:  Social History   Substance and Sexual Activity  Alcohol Use No     Social History   Substance and Sexual Activity  Drug Use No    Social History   Socioeconomic History  . Marital status: Single    Spouse name: Not on file  . Number of children: 0  . Years of education: Not on file  . Highest education level: Not on file  Occupational History  . Not on file  Social Needs  . Financial resource strain: Not on file  . Food insecurity:    Worry: Not on file    Inability: Not on file  . Transportation needs:    Medical: Not on file    Non-medical: Not on file  Tobacco Use  . Smoking status: Never Smoker  . Smokeless tobacco: Never Used  Substance and Sexual Activity  . Alcohol use: No  . Drug use: No  . Sexual activity: Never    Birth control/protection: None  Lifestyle  . Physical activity:    Days per week: Not on file    Minutes per session: Not on file  . Stress: Not on file  Relationships  . Social connections:    Talks on phone: Not on file    Gets together: Not on file    Attends religious service: Not on file    Active member of club or organization: Not on file    Attends meetings of clubs or organizations: Not on file    Relationship status: Not on file  Other Topics Concern  . Not on file  Social History Narrative  . Not on file    Sleep: Good  Appetite:  Good  Current Medications: Current Facility-Administered Medications  Medication Dose Route Frequency Provider Last Rate Last Dose  . acetaminophen (TYLENOL) tablet 650 mg  650 mg Oral Q6H PRN Leeanne Rio, MD      . atorvastatin (LIPITOR) tablet 40 mg  40 mg Oral QHS Rittberger, Bernita Raisin, DO   40 mg at 07/29/17 2204  . chlorproMAZINE (THORAZINE) tablet 50 mg  50 mg Oral TID Kathrene Alu, MD   50 mg at 07/29/17 2207  . insulin aspart (novoLOG) injection 0-15 Units  0-15 Units Subcutaneous TID WC Bland, Scott, DO   5 Units at 07/30/17 1034  . insulin aspart (novoLOG) injection 0-5 Units  0-5 Units Subcutaneous QHS Sherene Sires, DO   4 Units at 07/29/17 2207  . insulin glargine (LANTUS) injection 40 Units  40 Units Subcutaneous BID Riccio, Angela C, DO      . levETIRAcetam (KEPPRA) tablet 500 mg  500 mg Oral BID Kathrene Alu, MD   500 mg at 07/30/17 1033  . lisinopril (PRINIVIL,ZESTRIL) tablet 10 mg  10 mg Oral Daily Kathrene Alu, MD   10 mg at 07/30/17 1033  . [START ON 07/31/2017] magnesium sulfate IVPB 2 g 50 mL  2 g Intravenous Daily Riccio, Angela C, DO      . Melatonin TABS 4.5 mg  4.5 mg Oral QHS Kathrene Alu, MD   4.5 mg at 07/29/17 2207  . metoprolol tartrate (LOPRESSOR) tablet 25 mg  25 mg Oral BID Dorothy Spark, MD   25 mg at 07/30/17 1033    Lab Results:  Results for orders placed or performed during the hospital encounter of 07/26/17 (from the past 48 hour(s))  Glucose, capillary     Status: Abnormal   Collection Time: 07/28/17  4:19 PM  Result Value Ref Range   Glucose-Capillary 309 (H) 70 - 99 mg/dL  Glucose, capillary     Status: Abnormal   Collection Time: 07/28/17  8:38 PM  Result Value Ref Range   Glucose-Capillary 359 (H) 70 - 99 mg/dL  Glucose, capillary     Status: Abnormal   Collection Time: 07/28/17  9:27 PM  Result Value Ref Range   Glucose-Capillary 310 (H) 70 - 99 mg/dL  Magnesium     Status: None   Collection Time: 07/29/17  7:26 AM  Result Value Ref Range   Magnesium 1.8 1.7 - 2.4 mg/dL    Comment: Performed at [facility], [place] [address], [place], [place] [address]  CBC     Status: Abnormal   Collection Time: 07/29/17   7:26 AM  Result Value Ref Range   WBC 2.7 (L) 4.0 - 10.5 K/uL   RBC 3.98 3.87 - 5.11 MIL/uL   Hemoglobin 10.1 (L) 12.0 - 15.0 g/dL   HCT 32.5 (L) 36.0 - 46.0 %   MCV 81.7 78.0 - 100.0 fL   MCH 25.4 (L) 26.0 - 34.0 pg   MCHC 31.1 30.0 - 36.0 g/dL   RDW 15.0 11.5 - 15.5 %   Platelets 95 (L) 150 - 400 K/uL    Comment: CONSISTENT WITH PREVIOUS RESULT Performed at [facility], [place] [address], [place], [place] [address]   Comprehensive metabolic panel     Status: Abnormal   Collection Time: 07/29/17  7:26 AM  Result Value Ref Range   Sodium 138 135 - 145 mmol/L   Potassium 4.1 3.5 - 5.1 mmol/L   Chloride 109 98 - 111 mmol/L    Comment: Please note change in reference range.   CO2 24 22 - 32 mmol/L   Glucose, Bld 242 (H) 70 - 99 mg/dL    Comment: Please note change in reference range.   BUN <5 (L) 6 - 20 mg/dL    Comment: Please note change in reference range.   Creatinine, Ser 0.35 (L) 0.44 - 1.00 mg/dL   Calcium 7.5 (L) 8.9 - 10.3 mg/dL   Total Protein 5.9 (L) 6.5 - 8.1 g/dL   Albumin 3.0 (L) 3.5 - 5.0 g/dL   AST 33 15 - 41 U/L   ALT 21 0 - 44 U/L    Comment: Please note change in reference range.   Alkaline Phosphatase 79 38 - 126 U/L   Total Bilirubin 0.6 0.3 - 1.2 mg/dL   GFR calc non Af Amer >60 >60 mL/min   GFR calc Af Amer >60 >60 mL/min  Comment: (NOTE) The eGFR has been calculated using the CKD EPI equation. This calculation has not been validated in all clinical situations. eGFR's persistently <60 mL/min signify possible Chronic Kidney Disease.    Anion gap 5 5 - 15    Comment: Performed at [facility] [address] [place], [place] [address]  Glucose, capillary     Status: Abnormal   Collection Time: 07/29/17  7:31 AM  Result Value Ref Range   Glucose-Capillary 241 (H) 70 - 99 mg/dL  Glucose, capillary     Status: Abnormal   Collection Time: 07/29/17 11:05 AM  Result Value Ref Range   Glucose-Capillary 252 (H) 70 - 99 mg/dL  Glucose,  capillary     Status: Abnormal   Collection Time: 07/29/17  4:09 PM  Result Value Ref Range   Glucose-Capillary 296 (H) 70 - 99 mg/dL  Glucose, capillary     Status: Abnormal   Collection Time: 07/29/17  9:17 PM  Result Value Ref Range   Glucose-Capillary 309 (H) 70 - 99 mg/dL   Comment 1 Notify RN   Glucose, capillary     Status: Abnormal   Collection Time: 07/30/17 12:41 AM  Result Value Ref Range   Glucose-Capillary 275 (H) 70 - 99 mg/dL   Comment 1 Notify RN   Glucose, capillary     Status: Abnormal   Collection Time: 07/30/17  4:08 AM  Result Value Ref Range   Glucose-Capillary 261 (H) 70 - 99 mg/dL   Comment 1 Notify RN   Magnesium     Status: Abnormal   Collection Time: 07/30/17  4:52 AM  Result Value Ref Range   Magnesium 1.6 (L) 1.7 - 2.4 mg/dL    Comment: Performed at [facility], [place] [address] [place], [place] [address]  CBC     Status: Abnormal   Collection Time: 07/30/17  4:52 AM  Result Value Ref Range   WBC 3.0 (L) 4.0 - 10.5 K/uL   RBC 4.03 3.87 - 5.11 MIL/uL   Hemoglobin 10.1 (L) 12.0 - 15.0 g/dL   HCT 32.7 (L) 36.0 - 46.0 %   MCV 81.1 78.0 - 100.0 fL   MCH 25.1 (L) 26.0 - 34.0 pg   MCHC 30.9 30.0 - 36.0 g/dL   RDW 15.2 11.5 - 15.5 %   Platelets 102 (L) 150 - 400 K/uL    Comment: CONSISTENT WITH PREVIOUS RESULT Performed at [facility], [place] [address] [place], [place] [address]   Comprehensive metabolic panel     Status: Abnormal   Collection Time: 07/30/17  4:52 AM  Result Value Ref Range   Sodium 137 135 - 145 mmol/L   Potassium 4.0 3.5 - 5.1 mmol/L   Chloride 107 98 - 111 mmol/L    Comment: Please note change in reference range.   CO2 24 22 - 32 mmol/L   Glucose, Bld 276 (H) 70 - 99 mg/dL    Comment: Please note change in reference range.   BUN <5 (L) 6 - 20 mg/dL    Comment: Please note change in reference range.   Creatinine, Ser 0.38 (L) 0.44 - 1.00 mg/dL   Calcium 8.5 (L) 8.9 - 10.3 mg/dL   Total Protein 6.2 (L) 6.5 - 8.1  g/dL   Albumin 3.2 (L) 3.5 - 5.0 g/dL   AST 31 15 - 41 U/L   ALT 21 0 - 44 U/L    Comment: Please note change in reference range.  Alkaline Phosphatase 100 38 - 126 U/L   Total Bilirubin 0.7 0.3 - 1.2 mg/dL   GFR calc non Af Amer >60 >60 mL/min   GFR calc Af Amer >60 >60 mL/min    Comment: (NOTE) The eGFR has been calculated using the CKD EPI equation. This calculation has not been validated in all clinical situations. eGFR's persistently <60 mL/min signify possible Chronic Kidney Disease.    Anion gap 6 5 - 15    Comment: Performed at [facility] [address] [place], [place] [address]  Glucose, capillary     Status: Abnormal   Collection Time: 07/30/17  7:48 AM  Result Value Ref Range   Glucose-Capillary 250 (H) 70 - 99 mg/dL    Blood Alcohol level:  Lab Results  Component Value Date   ETH <10 07/26/2017   ETH <10 07/05/2017    Musculoskeletal: Strength & Muscle Tone: within normal limits Gait & Station: UTA since lying in bed. Patient leans: N/A  Psychiatric Specialty Exam: Physical Exam  Nursing note and vitals reviewed. Constitutional: She is oriented to person, place, and time. She appears well-developed and well-nourished.  HENT:  Head: Normocephalic and atraumatic.  Neck: Normal range of motion.  Respiratory: Effort normal.  Musculoskeletal: Normal range of motion.  Neurological: She is alert and oriented to person, place, and time.  Skin: No rash noted.  Psychiatric: She has a normal mood and affect. Her speech is normal and behavior is normal. Judgment and thought content normal. Cognition and memory are normal.    Review of Systems  Constitutional: Negative for chills and fever.  Cardiovascular: Negative for chest pain.  Gastrointestinal: Negative for abdominal pain, constipation, diarrhea, nausea and vomiting.  Neurological: Positive for headaches.  Psychiatric/Behavioral: Negative for depression, hallucinations, substance abuse and  suicidal ideas. The patient does not have insomnia.   All other systems reviewed and are negative.   Blood pressure 132/72, pulse 74, temperature 97.6 F (36.4 C), temperature source Oral, resp. rate 18, height 5' 4"  (1.626 m), weight 99.7 kg (219 lb 12.8 oz), last menstrual period 07/02/2017, SpO2 94 %.Body mass index is 37.73 kg/m.  General Appearance: Fairly Groomed, obese, Caucasian female, wearing a hospital gown with corrective lenses and a washcloth over her head while lying in bed. NAD.   Eye Contact:  Good  Speech:  Clear and Coherent and Normal Rate  Volume:  Normal  Mood:  Euthymic  Affect:  Appropriate and Congruent  Thought Process:  Goal Directed, Linear and Descriptions of Associations: Intact  Orientation:  Full (Time, Place, and Person)  Thought Content:  Logical  Suicidal Thoughts:  No  Homicidal Thoughts:  No  Memory:  Immediate;   Good Recent;   Good Remote;   Good  Judgement:  Fair  Insight:  Fair  Psychomotor Activity:  Normal  Concentration:  Concentration: Good and Attention Span: Good  Recall:  Good  Fund of Knowledge:  Good  Language:  Good  Akathisia:  No  Handed:  Right  AIMS (if indicated):   N/A  Assets:  Communication Skills Desire for Improvement Housing Resilience Social Support  ADL's:  Intact  Cognition:  WNL  Sleep:   Good    Assessment:  Tina Saunders is a 32 y.o. female who was admitted for reported Seroquel and Buspar overdose. She also presented to the hospital on 6/14 for Thorazine overdose. In both cases, she did not appear drowsy or altered which made overdose less likely. Her sister was  contacted. She does not believe she overdosed since her pills were at home and did not appear to be missing. Her behaviors are likely related to secondary gain with obtaining companionship and attention in the hospital. She denies SI, HI or AVH at this time. She is future oriented. Her sister agrees to monitor patient for safety. She will follow up  with her outpatient provider and SW will assist with resources for follow up. She does not warrant inpatient psychiatric hospitalization at this time.   Treatment Plan Summary: -Continue Thorazine 50 mg TID for mood stabilization/agitation. -Continue to hold Seroquel due to QTc prolongation.  -Restart Zoloft 200 mg daily for mood and Buspar 15 mg BID for mood augmentation. -Patient should follow up with her outpatient provider. She was previously followed by the ACT team and would like to reestablish care. Please have unit SW provide patient resources for outpatient follow up. Please also provide patient with any resources for day programs to keep her occupied and potentially reduce hospitalizations.  --EKG reviewed and QTc 501 on 7/7 (downtrending from 517 on 7/6). Please closely monitor when starting or increasing QTc prolonging agents.  -Psychiatry will sign off on patient at this time. Please consult psychiatry again as needed.   Faythe Dingwall, DO 07/30/2017, 12:04 PM

## 2017-07-31 NOTE — Progress Notes (Signed)
Pt with orders to discharge to home. After RN read the social workers note, RN called MD to clarify order. MD states that patient is to be discharged home and will follow up with outpatient programs to keep her busy. Pt given discharge instructions and verbalized understanding.

## 2017-08-01 ENCOUNTER — Encounter: Payer: Self-pay | Admitting: *Deleted

## 2017-08-03 DIAGNOSIS — R162 Hepatomegaly with splenomegaly, not elsewhere classified: Secondary | ICD-10-CM | POA: Diagnosis not present

## 2017-08-03 DIAGNOSIS — Z888 Allergy status to other drugs, medicaments and biological substances status: Secondary | ICD-10-CM | POA: Diagnosis not present

## 2017-08-03 DIAGNOSIS — K7460 Unspecified cirrhosis of liver: Secondary | ICD-10-CM | POA: Diagnosis not present

## 2017-08-03 DIAGNOSIS — R110 Nausea: Secondary | ICD-10-CM | POA: Diagnosis not present

## 2017-08-03 DIAGNOSIS — E119 Type 2 diabetes mellitus without complications: Secondary | ICD-10-CM | POA: Diagnosis not present

## 2017-08-03 DIAGNOSIS — R1031 Right lower quadrant pain: Secondary | ICD-10-CM | POA: Diagnosis not present

## 2017-08-03 DIAGNOSIS — R1013 Epigastric pain: Secondary | ICD-10-CM | POA: Diagnosis not present

## 2017-08-03 DIAGNOSIS — I10 Essential (primary) hypertension: Secondary | ICD-10-CM | POA: Diagnosis not present

## 2017-08-03 DIAGNOSIS — Z79899 Other long term (current) drug therapy: Secondary | ICD-10-CM | POA: Diagnosis not present

## 2017-08-03 DIAGNOSIS — K760 Fatty (change of) liver, not elsewhere classified: Secondary | ICD-10-CM | POA: Diagnosis not present

## 2017-08-03 DIAGNOSIS — Z794 Long term (current) use of insulin: Secondary | ICD-10-CM | POA: Diagnosis not present

## 2017-08-04 DIAGNOSIS — R110 Nausea: Secondary | ICD-10-CM | POA: Diagnosis not present

## 2017-08-04 DIAGNOSIS — R1031 Right lower quadrant pain: Secondary | ICD-10-CM | POA: Diagnosis not present

## 2017-08-04 DIAGNOSIS — K7460 Unspecified cirrhosis of liver: Secondary | ICD-10-CM | POA: Diagnosis not present

## 2017-08-04 DIAGNOSIS — R162 Hepatomegaly with splenomegaly, not elsewhere classified: Secondary | ICD-10-CM | POA: Diagnosis not present

## 2017-08-07 ENCOUNTER — Emergency Department (HOSPITAL_COMMUNITY): Payer: Medicare HMO

## 2017-08-07 ENCOUNTER — Emergency Department (HOSPITAL_COMMUNITY)
Admission: EM | Admit: 2017-08-07 | Discharge: 2017-08-08 | Disposition: A | Discharge status: Home / Self Care | Payer: Medicare HMO | Attending: Emergency Medicine | Admitting: Emergency Medicine

## 2017-08-07 ENCOUNTER — Encounter (HOSPITAL_COMMUNITY): Payer: Self-pay | Admitting: Emergency Medicine

## 2017-08-07 DIAGNOSIS — F29 Unspecified psychosis not due to a substance or known physiological condition: Secondary | ICD-10-CM | POA: Diagnosis not present

## 2017-08-07 DIAGNOSIS — E7800 Pure hypercholesterolemia, unspecified: Secondary | ICD-10-CM | POA: Diagnosis not present

## 2017-08-07 DIAGNOSIS — R079 Chest pain, unspecified: Secondary | ICD-10-CM | POA: Diagnosis not present

## 2017-08-07 DIAGNOSIS — R45851 Suicidal ideations: Secondary | ICD-10-CM | POA: Diagnosis not present

## 2017-08-07 DIAGNOSIS — E119 Type 2 diabetes mellitus without complications: Secondary | ICD-10-CM | POA: Insufficient documentation

## 2017-08-07 DIAGNOSIS — J45909 Unspecified asthma, uncomplicated: Secondary | ICD-10-CM | POA: Diagnosis not present

## 2017-08-07 DIAGNOSIS — Z794 Long term (current) use of insulin: Secondary | ICD-10-CM | POA: Insufficient documentation

## 2017-08-07 DIAGNOSIS — R0789 Other chest pain: Secondary | ICD-10-CM

## 2017-08-07 DIAGNOSIS — E1165 Type 2 diabetes mellitus with hyperglycemia: Secondary | ICD-10-CM | POA: Diagnosis not present

## 2017-08-07 DIAGNOSIS — Z79899 Other long term (current) drug therapy: Secondary | ICD-10-CM | POA: Diagnosis not present

## 2017-08-07 DIAGNOSIS — I10 Essential (primary) hypertension: Secondary | ICD-10-CM | POA: Diagnosis not present

## 2017-08-07 DIAGNOSIS — F209 Schizophrenia, unspecified: Secondary | ICD-10-CM | POA: Insufficient documentation

## 2017-08-07 DIAGNOSIS — R000 Tachycardia, unspecified: Secondary | ICD-10-CM | POA: Diagnosis not present

## 2017-08-07 LAB — SALICYLATE LEVEL

## 2017-08-07 LAB — BASIC METABOLIC PANEL
Anion gap: 10 (ref 5–15)
CALCIUM: 9 mg/dL (ref 8.9–10.3)
CO2: 26 mmol/L (ref 22–32)
CREATININE: 0.54 mg/dL (ref 0.44–1.00)
Chloride: 99 mmol/L (ref 98–111)
GFR calc non Af Amer: >60 mL/min (ref >60)
GLUCOSE: 425 mg/dL — AB (ref 70–99)
Potassium: 3.9 mmol/L (ref 3.5–5.1)
SODIUM: 135 mmol/L (ref 135–145)

## 2017-08-07 LAB — CBC
HCT: 35.2 % — ABNORMAL LOW (ref 36.0–46.0)
Hemoglobin: 10.9 g/dL — ABNORMAL LOW (ref 12.0–15.0)
MCH: 25.3 pg — AB (ref 26.0–34.0)
MCHC: 31 g/dL (ref 30.0–36.0)
MCV: 81.7 fL (ref 78.0–100.0)
PLATELETS: 111 10*3/uL — AB (ref 150–400)
RBC: 4.31 MIL/uL (ref 3.87–5.11)
RDW: 15.2 % (ref 11.5–15.5)
WBC: 3.1 10*3/uL — ABNORMAL LOW (ref 4.0–10.5)

## 2017-08-07 LAB — I-STAT TROPONIN, ED: TROPONIN I, POC: 0 ng/mL (ref 0.00–0.08)

## 2017-08-07 LAB — I-STAT BETA HCG BLOOD, ED (MC, WL, AP ONLY)

## 2017-08-07 LAB — RAPID URINE DRUG SCREEN, HOSP PERFORMED
Amphetamines: NOT DETECTED
Benzodiazepines: NOT DETECTED
Cocaine: NOT DETECTED
Opiates: NOT DETECTED
Tetrahydrocannabinol: NOT DETECTED

## 2017-08-07 LAB — ETHANOL

## 2017-08-07 LAB — ACETAMINOPHEN LEVEL

## 2017-08-07 NOTE — ED Triage Notes (Signed)
Patient arrived with EMS from home reports central chest pain onset this morning , denies SOB , nausea or diaphoresis , she received ASA 324 mg prior to arrival , CBG= 444 ( has not taken her insulin today ) , pt. added suicidal ideation - plans to overdose on her medications , denies hallucinations .

## 2017-08-07 NOTE — ED Notes (Signed)
Purple scrubs given to pt. , staffing office notified for pt.'s sitter , security paged to wand pt. , personal belongings bagged and labelled at triage .

## 2017-08-08 DIAGNOSIS — R0789 Other chest pain: Secondary | ICD-10-CM | POA: Diagnosis not present

## 2017-08-08 LAB — CBG MONITORING, ED
GLUCOSE-CAPILLARY: 351 mg/dL — AB (ref 70–99)
Glucose-Capillary: 351 mg/dL — ABNORMAL HIGH (ref 70–99)

## 2017-08-08 MED ORDER — ACETAMINOPHEN 325 MG PO TABS
650.0000 mg | ORAL_TABLET | Freq: Once | ORAL | Status: AC
  Administered 2017-08-08: 650 mg via ORAL
  Filled 2017-08-08: qty 2

## 2017-08-08 NOTE — ED Notes (Signed)
Pt ambulate to bathroom 

## 2017-08-08 NOTE — ED Provider Notes (Signed)
[facility] EMERGENCY DEPARTMENT Provider Note   CSN: [id number] Arrival date & time: 08/07/17  2059     History   Chief Complaint Chief Complaint  Patient presents with  . Chest Pain  . Suicidal    HPI Tina Saunders is a 32 y.o. female.  HPI  This is a 32 year old female well-known to our emergency department with 22 visits in the last 6 months who presents with chest pain.  Patient has a history of diabetes, hypertension, schizophrenia, bipolar disorder, prior drug overdose.  She reports she had onset of sharp right-sided chest pain that was sharp and nonradiating earlier today.  Denies any fevers or cough.  Currently she rates her pain at 8 out of 10.  She has not taken anything for her pain.  Denies any lower extremity swelling or history of blood clots.  Patient also endorsed to nursing that she felt suicidal.  She states "I think they misunderstood me."  She does state that she told them that she wanted to hurt herself; however, she states that she did not mean it "in that way."  She does have a history of overdose and was recently admitted to the hospital on July 4.  She currently is denying any plans for self-harm.  Past Medical History:  Diagnosis Date  . Anxiety   . Asthma   . Bipolar 1 disorder ([place])   . Cancer of abdominal wall   . Depression   . Diabetes mellitus without complication ([place])   . High cholesterol   . Hypertension   . Intentional drug overdose ([place]) 07/26/2017   Seroquel and buspirone   . Obesity   . Obesity   . Polycystic ovarian syndrome 07/01/2011   Patient report  . 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 Schizophrenia ([place])   . Seizures [facility])     Patient Active Problem List   Diagnosis Date Noted  . Adjustment disorder with mixed disturbance of emotions and conduct   . QT prolongation   . Suicide attempt ([place])   . Overdose of antidepressant, intentional self-harm, initial encounter ([place])   . Overdose 07/26/2017  . Suicide attempt by  other psychotropic drug overdose ([place])   . DKA (diabetic ketoacidoses) ([place]) 03/14/2017  . Chronic constipation   . Seizures ([place]) 12/11/2016  . DM2 (diabetes mellitus, type 2) ([place]) 12/11/2016  . Uncontrolled diabetes mellitus ([place]) 11/03/2016  . Schizoaffective disorder, bipolar type ([place]) 11/02/2016  . Cluster B personality disorder ([place]) 11/02/2016  . Suicidal ideation   . 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 HTN (hypertension) 04/15/2013  . Post traumatic stress disorder 12/07/2011  . PSVT (paroxysmal supraventricular tachycardia) ([place]) 08/26/2011  . ADHD 09/23/2007  . EPIGASTRIC PAIN 09/23/2007  . Obesity, unspecified 07/30/2007  . Depression 07/30/2007  . SLEEP DISORDER 07/30/2007  . METRORRHAGIA 06/13/2006  . DISORDER, MENSTRUAL NEC 06/13/2006  . POLYCYSTIC OVARIAN DISEASE 04/25/2006  . AMENORRHEA, SECONDARY 04/20/2006  . ACNE, MILD 04/20/2006    Past Surgical History:  Procedure Laterality Date  . CHOLECYSTECTOMY    . COLONOSCOPY WITH PROPOFOL N/A 03/08/2017   Procedure: COLONOSCOPY WITH PROPOFOL;  Surgeon: Milus Banister, MD;  Location: [facility];  Service: Endoscopy;  Laterality: N/A;  . HERNIA REPAIR    . Ovarian Cyst Excision    . VARICOSE VEIN SURGERY       OB History    Gravida  0   Para      Term      Preterm  AB      Living        SAB      TAB      Ectopic      Multiple      Live Births               Home Medications    Prior to Admission medications   Medication Sig Start Date End Date Taking? Authorizing Provider  atorvastatin (LIPITOR) 40 MG tablet Take 1 tablet (40 mg total) by mouth at bedtime. 07/30/17  Yes Milus Banister C, DO  busPIRone (BUSPAR) 15 MG tablet Take 1 tablet (15 mg total) by mouth 3 (three) times daily. 07/30/17  Yes Daisy Floro, DO  chlorproMAZINE (THORAZINE) 50 MG tablet Take 1 tablet (50 mg total) by mouth 3 (three) times daily. For agitation/mood control 02/09/17  Yes Lindell Spar I, NP    gabapentin (NEURONTIN) 400 MG capsule Take 1 capsule (400 mg total) by mouth 3 (three) times daily. For agitation/diabetic neuropathy 02/09/17  Yes Lindell Spar I, NP  insulin aspart (NOVOLOG) 100 UNIT/ML injection Inject 6 Units into the skin 3 (three) times daily with meals. For diabetes management 07/17/17  Yes Montine Circle, PA-C  insulin detemir (LEVEMIR) 100 UNIT/ML injection Inject 0.68 mLs (68 Units total) into the skin 2 (two) times daily. For diabetes management 07/17/17  Yes Valere Dross, Alyssa B, PA-C  levETIRAcetam (KEPPRA) 500 MG tablet Take 1 tablet (500 mg total) by mouth 2 (two) times daily. For mood stabilization 02/09/17  Yes Nwoko, Herbert Pun I, NP  lisinopril (PRINIVIL,ZESTRIL) 10 MG tablet Take 1 tablet (10 mg total) by mouth daily. For high blood pressure 02/09/17  Yes Nwoko, Herbert Pun I, NP  Magnesium 500 MG TABS Take 1 tablet (500 mg total) by mouth daily. 07/30/17  Yes Daisy Floro, DO  Melatonin 5 MG TABS Take 5 mg by mouth at bedtime.   Yes [provider]  metoprolol tartrate (LOPRESSOR) 25 MG tablet Take 1 tablet (25 mg total) by mouth 2 (two) times daily. 07/30/17  Yes Milus Banister C, DO  QUEtiapine (SEROQUEL) 100 MG tablet Take 100 mg by mouth daily.   Yes [provider]  QUEtiapine (SEROQUEL) 200 MG tablet Take 500 mg by mouth at bedtime.   Yes [provider]  sertraline (ZOLOFT) 100 MG tablet Take 200 mg by mouth daily.   Yes [provider]  topiramate (TOPAMAX) 100 MG tablet Take 100 mg by mouth 2 (two) times daily.   Yes [provider]  traZODone (DESYREL) 50 MG tablet Take 50 mg by mouth at bedtime.   Yes [provider]  Insulin Syringe-Needle U-100 25G X 1" 1 ML MISC 1 Syringe by Does not apply route 4 (four) times daily -  before meals and at bedtime. 07/17/17   Albesa Seen, PA-C    Family History Family History  Problem Relation Age of Onset  . Coronary artery disease Maternal Grandmother   . Diabetes  type II Maternal Grandmother   . Cancer Maternal Grandmother   . Hypertension Mother   . Hypertension Father     Social History Social History   Tobacco Use  . Smoking status: Never Smoker  . Smokeless tobacco: Never Used  Substance Use Topics  . Alcohol use: No  . Drug use: No     Allergies   Fish-derived products; Geodon [ziprasidone hcl]; Haldol [haloperidol lactate]; Buprenorphine hcl; Compazine [prochlorperazine]; Morphine and related; and Toradol [ketorolac tromethamine]   Review of  Systems Review of Systems  Constitutional: Negative for fever.  Respiratory: Negative for cough and shortness of breath.   Cardiovascular: Positive for chest pain. Negative for leg swelling.  Gastrointestinal: Negative for abdominal pain, nausea and vomiting.  Psychiatric/Behavioral: Positive for suicidal ideas. Negative for self-injury.  All other systems reviewed and are negative.    Physical Exam Updated Vital Signs BP (!) 159/98   Pulse 84   Temp 98.3 F (36.8 C) (Oral)   Resp 20   LMP 07/24/2017 (Approximate)   SpO2 98%   Physical Exam  Constitutional: She is oriented to person, place, and time.  Morbidly obese  HENT:  Head: Normocephalic and atraumatic.  Eyes: Pupils are equal, round, and reactive to light.  Neck: Neck supple.  Cardiovascular: Normal rate, regular rhythm, normal heart sounds and normal pulses.  Pulmonary/Chest: Effort normal. No respiratory distress. She has no wheezes.  Abdominal: Soft. Bowel sounds are normal.  Musculoskeletal:       Right lower leg: She exhibits no tenderness and no edema.       Left lower leg: She exhibits no tenderness and no edema.  Neurological: She is alert and oriented to person, place, and time.  Skin: Skin is warm and dry.  Psychiatric: She has a normal mood and affect.  Nursing note and vitals reviewed.    ED Treatments / Results  Labs (all labs ordered are listed, but only abnormal results are displayed) Labs  Reviewed  BASIC METABOLIC PANEL - Abnormal; Notable for the following components:      Result Value   Glucose, Bld 425 (*)    BUN <5 (*)    All other components within normal limits  CBC - Abnormal; Notable for the following components:   WBC 3.1 (*)    Hemoglobin 10.9 (*)    HCT 35.2 (*)    MCH 25.3 (*)    Platelets 111 (*)    All other components within normal limits  ACETAMINOPHEN LEVEL - Abnormal; Notable for the following components:   Acetaminophen (Tylenol), Serum <10 (*)    All other components within normal limits  RAPID URINE DRUG SCREEN, HOSP PERFORMED - Abnormal; Notable for the following components:   Barbiturates   (*)    Value: Result not available. Reagent lot number recalled by manufacturer.   All other components within normal limits  ETHANOL  SALICYLATE LEVEL  I-STAT TROPONIN, ED  I-STAT BETA HCG BLOOD, ED ([facility], [facility], [facility] ONLY)    EKG EKG Interpretation  Date/Time:  Tuesday August 07 2017 21:08:17 EDT Ventricular Rate:  100 PR Interval:  172 QRS Duration: 90 QT Interval:  378 QTC Calculation: 487 R Axis:   3 Text Interpretation:  duplicate, discard Confirmed by Delora Fuel ([id number]) on 08/07/2017 11:55:04 PM   Radiology Dg Chest 2 View  Result Date: 08/07/2017 CLINICAL DATA:  Central chest pain for 1 day.  Nonsmoker. EXAM: CHEST - 2 VIEW COMPARISON:  Chest radiograph July 21, 2017 FINDINGS: Cardiac silhouette is upper limits of normal in size. Mediastinal silhouette is not suspicious. No pleural effusion or focal consolidation. Trace blunting of the posterior costophrenic angle most compatible pleural thickening. No pneumothorax. Osseous structures are non suspicious. Rounded densities projecting anterior chest wall: And lateral radiograph and new from prior radiograph, likely external to the patient. IMPRESSION: No active cardiopulmonary process. Electronically Signed   By: Elon Alas M.D.   On: 08/07/2017 22:17    Procedures Procedures (including  critical care time)  Medications Ordered in ED  Medications  acetaminophen (TYLENOL) tablet 650 mg (650 mg Oral Given 08/08/17 0305)     Initial Impression / Assessment and Plan / ED Course  I have reviewed the triage vital signs and the nursing notes.  Pertinent labs & imaging results that were available during my care of the patient were reviewed by me and considered in my medical decision making (see chart for details).     Patient presents with chest pain.  She has presented frequently with hyperglycemia and chest pain.  She is hyperglycemic today.  No evidence of anion gap.  She endorses compliance with her insulin but I suspect she is not taking her insulin.  She is otherwise nontoxic-appearing.  Her exam is fairly benign.  She has had prior work-ups for her chest pain which have been reassuring.  Last d-dimer was negative at the end of June.  She is low risk otherwise.  Doubt PE.  EKG shows no evidence of ischemia.  Chest x-ray shows no evidence of pneumothorax or pneumonia.  From a medical standpoint, feel she is medically clear.  Given her history of suicidal attempt, will have TTS evaluate her.  She is currently denying any self-harm to me but did endorse self-harm to nursing previously.  6:36 AM Discussed with TTS.  They feel she has contracted for safety and is not showing any active signs of depression or active suicidality.  Patient does contract for safety.  Will discharge home.  Final Clinical Impressions(s) / ED Diagnoses   Final diagnoses:  Atypical chest pain  Passive suicidal ideations    ED Discharge Orders    None       Jacarius Handel, Barbette Hair, MD 08/08/17 [phone]

## 2017-08-08 NOTE — ED Notes (Signed)
Per staffing no sitter available till 0700

## 2017-08-08 NOTE — ED Notes (Signed)
Patient is resting comfortably. 

## 2017-08-08 NOTE — ED Notes (Signed)
Pt alert sitting in chair watching TV VS documented

## 2017-08-08 NOTE — ED Notes (Signed)
While ambulating patient to Pod F she asked if she would get to talk to a psychiatrist tonight and also stated "there was a mix up, I was never suicidal". RN for [employer] [facility] was made aware.

## 2017-08-08 NOTE — ED Notes (Addendum)
Patient belongings inventoried and placed in locker [place]

## 2017-08-08 NOTE — ED Notes (Signed)
EDP is aware that home meds needs to be placed; patient still waiting for TTS-Monique,RN

## 2017-08-08 NOTE — ED Notes (Signed)
Dr Dina Rich aware of cbg was 71, pt states she has her novolog and levamir at home

## 2017-08-08 NOTE — ED Notes (Signed)
Pt in room,alert VS documented

## 2017-08-08 NOTE — ED Notes (Signed)
Pt currently denying and plans of self harm. Denies any plans to overdose.

## 2017-08-08 NOTE — Discharge Instructions (Addendum)
You were seen today for chest pain.  Your work-up is reassuring.  You also mention feelings of suicidality to nursing.  If you begin to feel hopeless or suicidal, you should be reevaluated immediately.

## 2017-08-08 NOTE — BH Assessment (Addendum)
Tele Assessment Note   Patient Name: Tina Saunders MRN: [id number] Referring Physician: Thayer Jew MD Location of Patient: [facility] Location of Provider: Behavioral Health TTS Department  Tina Saunders is an 32 y.o. female came to the [facility] voluntarily via EMS c/o of chest pain onset yesterday. Pt also initially told ED staff that she was having SI per ED notes.  A short time before (to ED staff) and during the assessment pt stated that she had been misunderstood and was not suicidal. Pt denied SI, HI, SHI and AVH. Pt denies any symptoms of depression at this time. Pt sts she has no current stressors stating that she had just gotten rid of a roommate which reduced her stress tremendously. Pt sts that her [facility] is working on switching her from [place] to [facility] which she sts she is happy about. Her [facility]T provides her medication management. Pt sts she is currently compliant with her prescribed medications. Pt frequents the ED and has been seen in the ED over 22 times in the last 6 months per pt record. Pt has a hx of intentional overdoses and was hospitalized last on 07/26/17 with an apparent suicide attempt via intentional overdose.   Pt sts she lives alone and has recently gotten rid of a troublesome roommate. Pt receives emotional support from her mother. Pt is unemployed and receives disability income. Pt denies access to guns. Pt has no hx of issues with LE and does not have a hx of aggression per pt hx. Pt denies all symptoms of depression and anxiety at this time and sts she feels "good." Pt sts she is sleeping "good" and eating regularly. Pt denies any hx of abuse.   Pt was dressed in scrubs and sitting with her legs up in a chair. Pt was alert, cooperative and polite. Pt kept good eye contact, spoke in a clear tone and at a normal pace. Pt moved in a normal manner when moving. Pt's thought process was coherent and relevant and judgement appeared unimpaired.  No  indication of delusional thinking or response to internal stimuli. Pt's mood was stated as neither depressed nor anxious and her euthymic affect was congruent.  Pt was oriented x 4, to person, place, time and situation.   Diagnosis: Schizophrenia (by hx); Bipolar I D/O (by hx)  Past Medical History:  Past Medical History:  Diagnosis Date  . Anxiety   . Asthma   . Bipolar 1 disorder (Tina Saunders)   . Cancer of abdominal wall   . Depression   . Diabetes mellitus without complication ([place])   . High cholesterol   . Hypertension   . Intentional drug overdose ([place]) 07/26/2017   Seroquel and buspirone   . Obesity   . Obesity   . Polycystic ovarian syndrome 07/01/2011   Patient report  . 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 Schizophrenia ([place])   . Seizures ([place])     Past Surgical History:  Procedure Laterality Date  . CHOLECYSTECTOMY    . COLONOSCOPY WITH PROPOFOL N/A 03/08/2017   Procedure: COLONOSCOPY WITH PROPOFOL;  Surgeon: Tina Banister, MD;  Location: [facility];  Service: Endoscopy;  Laterality: N/A;  . HERNIA REPAIR    . Ovarian Cyst Excision    . VARICOSE VEIN SURGERY      Family History:  Family History  Problem Relation Age of Onset  . Coronary artery disease Maternal Grandmother   . Diabetes type II Maternal Grandmother   . Cancer Maternal Grandmother   .  Hypertension Mother   . Hypertension Father     Social History:  reports that she has never smoked. She has never used smokeless tobacco. She reports that she does not drink alcohol or use drugs.  Additional Social History:  Alcohol / Drug Use Prescriptions: SEE MAR History of alcohol / drug use?: No history of alcohol / drug abuse  CIWA: CIWA-Ar BP: (!) 159/98 Pulse Rate: 84 COWS:    Allergies:  Allergies  Allergen Reactions  . Fish-Derived Products Anaphylaxis    Can only eat [place]  . Geodon [Ziprasidone Hcl] Other (See Comments)    Face pulls, cant swallow - Locked Jaw  . Haldol [Haloperidol Lactate] Other (See  Comments)    Face pulls, can't swallow - Locked Jaw  . Buprenorphine Hcl Hives, Itching, Rash and Other (See Comments)    GI upset  . Compazine [Prochlorperazine] Other (See Comments)    anxiety and hyperactivity  . Morphine And Related Hives, Itching, Rash and Other (See Comments)    GI upset  . Toradol [Ketorolac Tromethamine] Other (See Comments)    Anxiety and hyperactivity    Home Medications:  (Not in a Saunders admission)  OB/GYN Status:  Patient's last menstrual period was 07/24/2017 (approximate).  General Assessment Data Location of Assessment: Tina Saunders ED TTS Assessment: In system Is this a Tele or Face-to-Face Assessment?: Tele Assessment Is this an Initial Assessment or a Re-assessment for this encounter?: Initial Assessment Marital status: Single Maiden name: Tina Saunders Is patient pregnant?: Unknown Pregnancy Status: Unknown Living Arrangements: Alone Can pt return to current living arrangement?: Yes Admission Status: Voluntary Is patient capable of signing voluntary admission?: Yes Referral Source: Self/Family/Friend Insurance type: [place] Living Arrangements: Alone Name of Psychiatrist: CONROY MD / [facility]([place] WORKING ON [facility]AM [facility]) Name of Therapist: NONE  Education Status Is patient currently in school?: No Is the patient employed, unemployed or receiving disability?: Receiving disability income  Risk to self with the past 6 months Suicidal Ideation: No(STS ED STAFF MUST HAVE MISUNDERSTOOD- DENIES) Has patient been a risk to self within the past 6 months prior to admission? : Yes Suicidal Intent: No Has patient had any suicidal intent within the past 6 months prior to admission? : Yes Is patient at risk for suicide?: No Suicidal Plan?: No Has patient had any suicidal plan within the past 6 months prior to admission? : Yes Access to Means: No Specify Access to Suicidal Means: HAS OD'D WITH RX MEDS IN THE  PAST What has been your use of drugs/alcohol within the last 12 months?: Tina Saunders Previous Attempts/Gestures: Yes How many times?: (MULTIPLE INTENTIONAL OVERDOSES) Other Self Harm Risks: Tina Saunders REPORTED Triggers for Past Attempts: Unpredictable Intentional Self Injurious Behavior: Tina Saunders Family Suicide History: No Recent stressful life event(s): Other (Comment)(STS NO CURRENT STRESSORS) Persecutory voices/beliefs?: No Depression: No(DENIES SYMPTOMS) Depression Symptoms: (DENIES SYMPTOMS) Substance abuse history and/or treatment for substance abuse?: No Suicide prevention information given to non-admitted patients: Not applicable  Risk to Others within the past 6 months Homicidal Ideation: No Does patient have any lifetime risk of violence toward others beyond the six months prior to admission? : No Thoughts of Harm to Others: No Current Homicidal Intent: No Current Homicidal Plan: No Access to Homicidal Means: No Identified Victim: Tina Saunders History of harm to others?: No Assessment of Violence: Tina Saunders Noted Violent Behavior Description: NA Does patient have access to weapons?: No Criminal Charges Pending?: No Does patient have a court date: No  Is patient on probation?: No  Psychosis Hallucinations: Tina Saunders noted Delusions: Tina Saunders noted  Mental Status Report Appearance/Hygiene: Unremarkable, In scrubs Eye Contact: Good Motor Activity: Freedom of movement Speech: Logical/coherent Level of Consciousness: Alert Mood: Euthymic, Pleasant Affect: Appropriate to circumstance Anxiety Level: Tina Saunders Thought Processes: Coherent, Relevant Judgement: Unimpaired Orientation: Person, Place, Time, Situation Obsessive Compulsive Thoughts/Behaviors: Tina Saunders  Cognitive Functioning Concentration: Normal Memory: Recent Intact, Remote Intact Is patient IDD: No Is patient DD?: No Insight: Good Impulse Control: Good Appetite: Good Have you had any weight changes? : No Change Sleep: No Change Total Hours of  Sleep: (VARIES) Vegetative Symptoms: Tina Saunders  ADLScreening [facility] Assessment Services) Patient's cognitive ability adequate to safely complete daily activities?: Yes Patient able to express need for assistance with ADLs?: Yes Independently performs ADLs?: Yes (appropriate for developmental age)  Prior Inpatient Therapy Prior Inpatient Therapy: Yes Prior Therapy Dates: (MULTIPLE) Prior Therapy Facilty/Provider(s): [facility] Reason for Treatment: SI  Prior Outpatient Therapy Prior Outpatient Therapy: Yes Prior Therapy Dates: 2019 Prior Therapy Facilty/Provider(s): [facility] Reason for Treatment: MED MGMT Does patient have an ACCT team?: Yes(CHANGING FROM [facility] TO [facility] PER PT) Does patient have Intensive In-House Services?  : No Does patient have Tina services? : Yes Does patient have P4CC services?: No  ADL Screening (condition at time of admission) Patient's cognitive ability adequate to safely complete daily activities?: Yes Patient able to express need for assistance with ADLs?: Yes Independently performs ADLs?: Yes (appropriate for developmental age)       Abuse/Neglect Assessment (Assessment to be complete while patient is alone) Physical Abuse: Denies Verbal Abuse: Denies Sexual Abuse: Denies Exploitation of patient/patient's resources: Denies Self-Neglect: Denies     Regulatory affairs officer (For Healthcare) Does Patient Have a Medical Advance Directive?: No Would patient like information on creating a medical advance directive?: No - Patient declined          Disposition:  Disposition Initial Assessment Completed for this Encounter: Yes Patient referred to: Other (Comment)(PENDING REVIEW [place] [facility] EXTENDER)  This service was provided via telemedicine using a 2-way, interactive audio and Radiographer, therapeutic.  Names of all persons participating in this telemedicine service and their role in this encounter. Name: Faylene Kurtz, [facility] Role: TTS  Name: Cameron Proud Role:  Patient  Name:  Role:   Name:  Role:    Per Lindon Romp NP, pt is psych cleared for discharge once medically cleared.   Spoke with Dr. Dina Rich, EDP at [facility] and advised her of recommendation.   Faylene Kurtz, MS, CRC, [place] Triage Specialist [facility] T 08/08/2017 6:07 AM

## 2017-08-08 NOTE — ED Notes (Signed)
TTS in process 

## 2017-08-08 NOTE — ED Notes (Signed)
Charge aware that I spoke to Dr Dina Rich about cbg, pt given bus pass and d/c

## 2017-08-10 DIAGNOSIS — I10 Essential (primary) hypertension: Secondary | ICD-10-CM | POA: Diagnosis not present

## 2017-08-10 DIAGNOSIS — Z0000 Encounter for general adult medical examination without abnormal findings: Secondary | ICD-10-CM | POA: Diagnosis not present

## 2017-08-10 DIAGNOSIS — Z0001 Encounter for general adult medical examination with abnormal findings: Secondary | ICD-10-CM | POA: Diagnosis not present

## 2017-08-10 DIAGNOSIS — R569 Unspecified convulsions: Secondary | ICD-10-CM | POA: Diagnosis not present

## 2017-08-10 DIAGNOSIS — E7849 Other hyperlipidemia: Secondary | ICD-10-CM | POA: Diagnosis not present

## 2017-08-10 DIAGNOSIS — A630 Anogenital (venereal) warts: Secondary | ICD-10-CM | POA: Diagnosis not present

## 2017-08-10 DIAGNOSIS — F319 Bipolar disorder, unspecified: Secondary | ICD-10-CM | POA: Diagnosis not present

## 2017-08-10 DIAGNOSIS — E1165 Type 2 diabetes mellitus with hyperglycemia: Secondary | ICD-10-CM | POA: Diagnosis not present

## 2017-08-12 DIAGNOSIS — R0789 Other chest pain: Secondary | ICD-10-CM | POA: Diagnosis not present

## 2017-08-12 DIAGNOSIS — D649 Anemia, unspecified: Secondary | ICD-10-CM | POA: Diagnosis not present

## 2017-08-12 DIAGNOSIS — D696 Thrombocytopenia, unspecified: Secondary | ICD-10-CM | POA: Diagnosis not present

## 2017-08-12 DIAGNOSIS — E1165 Type 2 diabetes mellitus with hyperglycemia: Secondary | ICD-10-CM | POA: Diagnosis not present

## 2017-09-12 ENCOUNTER — Emergency Department (HOSPITAL_COMMUNITY): Payer: Medicare HMO

## 2017-09-12 ENCOUNTER — Emergency Department (HOSPITAL_COMMUNITY)
Admission: EM | Admit: 2017-09-12 | Discharge: 2017-09-12 | Disposition: A | Discharge status: Home / Self Care | Payer: Medicare HMO | Attending: Emergency Medicine | Admitting: Emergency Medicine

## 2017-09-12 DIAGNOSIS — R509 Fever, unspecified: Secondary | ICD-10-CM | POA: Insufficient documentation

## 2017-09-12 DIAGNOSIS — D638 Anemia in other chronic diseases classified elsewhere: Secondary | ICD-10-CM | POA: Diagnosis not present

## 2017-09-12 DIAGNOSIS — R10813 Right lower quadrant abdominal tenderness: Secondary | ICD-10-CM | POA: Diagnosis not present

## 2017-09-12 DIAGNOSIS — J45909 Unspecified asthma, uncomplicated: Secondary | ICD-10-CM | POA: Insufficient documentation

## 2017-09-12 DIAGNOSIS — R740 Nonspecific elevation of levels of transaminase and lactic acid dehydrogenase [LDH]: Secondary | ICD-10-CM | POA: Insufficient documentation

## 2017-09-12 DIAGNOSIS — R531 Weakness: Secondary | ICD-10-CM | POA: Diagnosis not present

## 2017-09-12 DIAGNOSIS — R0789 Other chest pain: Secondary | ICD-10-CM | POA: Insufficient documentation

## 2017-09-12 DIAGNOSIS — F319 Bipolar disorder, unspecified: Secondary | ICD-10-CM | POA: Diagnosis not present

## 2017-09-12 DIAGNOSIS — D696 Thrombocytopenia, unspecified: Secondary | ICD-10-CM | POA: Diagnosis not present

## 2017-09-12 DIAGNOSIS — Z794 Long term (current) use of insulin: Secondary | ICD-10-CM | POA: Diagnosis not present

## 2017-09-12 DIAGNOSIS — R7989 Other specified abnormal findings of blood chemistry: Secondary | ICD-10-CM

## 2017-09-12 DIAGNOSIS — I10 Essential (primary) hypertension: Secondary | ICD-10-CM | POA: Diagnosis not present

## 2017-09-12 DIAGNOSIS — D649 Anemia, unspecified: Secondary | ICD-10-CM

## 2017-09-12 DIAGNOSIS — A630 Anogenital (venereal) warts: Secondary | ICD-10-CM | POA: Diagnosis not present

## 2017-09-12 DIAGNOSIS — E1165 Type 2 diabetes mellitus with hyperglycemia: Secondary | ICD-10-CM | POA: Diagnosis not present

## 2017-09-12 DIAGNOSIS — R109 Unspecified abdominal pain: Secondary | ICD-10-CM | POA: Diagnosis not present

## 2017-09-12 DIAGNOSIS — Z79899 Other long term (current) drug therapy: Secondary | ICD-10-CM | POA: Diagnosis not present

## 2017-09-12 DIAGNOSIS — R079 Chest pain, unspecified: Secondary | ICD-10-CM | POA: Diagnosis not present

## 2017-09-12 DIAGNOSIS — R110 Nausea: Secondary | ICD-10-CM | POA: Insufficient documentation

## 2017-09-12 DIAGNOSIS — E7849 Other hyperlipidemia: Secondary | ICD-10-CM | POA: Diagnosis not present

## 2017-09-12 DIAGNOSIS — R000 Tachycardia, unspecified: Secondary | ICD-10-CM | POA: Diagnosis not present

## 2017-09-12 LAB — I-STAT VENOUS BLOOD GAS, ED
Acid-base deficit: 4 mmol/L — ABNORMAL HIGH (ref 0.0–2.0)
Bicarbonate: 20.9 mmol/L (ref 20.0–28.0)
O2 Saturation: 95 %
TCO2: 22 mmol/L (ref 22–32)
pCO2, Ven: 35.6 mmHg — ABNORMAL LOW (ref 44.0–60.0)
pH, Ven: 7.378 (ref 7.250–7.430)
pO2, Ven: 75 mmHg — ABNORMAL HIGH (ref 32.0–45.0)

## 2017-09-12 LAB — URINALYSIS, ROUTINE W REFLEX MICROSCOPIC
BILIRUBIN URINE: NEGATIVE
Glucose, UA: >=500 mg/dL — AB
KETONES UR: NEGATIVE mg/dL
Nitrite: NEGATIVE
PH: 5 (ref 5.0–8.0)
Protein, ur: NEGATIVE mg/dL
SPECIFIC GRAVITY, URINE: 1.028 (ref 1.005–1.030)

## 2017-09-12 LAB — I-STAT CG4 LACTIC ACID, ED
LACTIC ACID, VENOUS: 3.85 mmol/L — AB (ref 0.5–1.9)
Lactic Acid, Venous: 2.01 mmol/L (ref 0.5–1.9)

## 2017-09-12 LAB — CBC WITH DIFFERENTIAL/PLATELET
Abs Immature Granulocytes: 0 10*3/uL (ref 0.0–0.1)
Basophils Absolute: 0 10*3/uL (ref 0.0–0.1)
Basophils Relative: 1 %
Eosinophils Absolute: 0.1 10*3/uL (ref 0.0–0.7)
Eosinophils Relative: 2 %
HEMATOCRIT: 36 % (ref 36.0–46.0)
Hemoglobin: 11.1 g/dL — ABNORMAL LOW (ref 12.0–15.0)
IMMATURE GRANULOCYTES: 0 %
LYMPHS PCT: 25 %
Lymphs Abs: 1 10*3/uL (ref 0.7–4.0)
MCH: 25.3 pg — ABNORMAL LOW (ref 26.0–34.0)
MCHC: 30.8 g/dL (ref 30.0–36.0)
MCV: 82 fL (ref 78.0–100.0)
MONOS PCT: 6 %
Monocytes Absolute: 0.3 10*3/uL (ref 0.1–1.0)
NEUTROS ABS: 2.7 10*3/uL (ref 1.7–7.7)
NEUTROS PCT: 66 %
Platelets: 103 10*3/uL — ABNORMAL LOW (ref 150–400)
RBC: 4.39 MIL/uL (ref 3.87–5.11)
RDW: 15.2 % (ref 11.5–15.5)
WBC: 4 10*3/uL (ref 4.0–10.5)

## 2017-09-12 LAB — CBG MONITORING, ED: Glucose-Capillary: 592 mg/dL (ref 70–99)

## 2017-09-12 LAB — COMPREHENSIVE METABOLIC PANEL
ALT: 30 U/L (ref 0–44)
AST: 42 U/L — AB (ref 15–41)
Albumin: 3.8 g/dL (ref 3.5–5.0)
Alkaline Phosphatase: 112 U/L (ref 38–126)
Anion gap: 14 (ref 5–15)
BILIRUBIN TOTAL: 1 mg/dL (ref 0.3–1.2)
BUN: <5 mg/dL — ABNORMAL LOW (ref 6–20)
CO2: 20 mmol/L — ABNORMAL LOW (ref 22–32)
Calcium: 8.8 mg/dL — ABNORMAL LOW (ref 8.9–10.3)
Chloride: 96 mmol/L — ABNORMAL LOW (ref 98–111)
Creatinine, Ser: 0.64 mg/dL (ref 0.44–1.00)
GFR calc Af Amer: >60 mL/min (ref >60)
GLUCOSE: 556 mg/dL — AB (ref 70–99)
POTASSIUM: 4.2 mmol/L (ref 3.5–5.1)
Sodium: 130 mmol/L — ABNORMAL LOW (ref 135–145)
TOTAL PROTEIN: 6.9 g/dL (ref 6.5–8.1)

## 2017-09-12 LAB — LIPASE, BLOOD: LIPASE: 34 U/L (ref 11–51)

## 2017-09-12 LAB — PHOSPHORUS: Phosphorus: 4.4 mg/dL (ref 2.5–4.6)

## 2017-09-12 LAB — I-STAT BETA HCG BLOOD, ED (MC, WL, AP ONLY): I-stat hCG, quantitative: <5 m[IU]/mL (ref <5)

## 2017-09-12 LAB — MAGNESIUM: Magnesium: 1.6 mg/dL — ABNORMAL LOW (ref 1.7–2.4)

## 2017-09-12 LAB — I-STAT TROPONIN, ED: Troponin i, poc: 0 ng/mL (ref 0.00–0.08)

## 2017-09-12 MED ORDER — SODIUM CHLORIDE 0.9 % IV BOLUS: 1000.0000 mL | Freq: Once | INTRAVENOUS | Status: DC

## 2017-09-12 MED ORDER — VANCOMYCIN HCL IN DEXTROSE 1-5 GM/200ML-% IV SOLN
1000.0000 mg | Freq: Once | INTRAVENOUS | Status: AC
  Administered 2017-09-12: 1000 mg via INTRAVENOUS
  Filled 2017-09-12: qty 200

## 2017-09-12 MED ORDER — FENTANYL CITRATE (PF) 100 MCG/2ML IJ SOLN
50.0000 ug | Freq: Once | INTRAMUSCULAR | Status: AC
  Administered 2017-09-12: 50 ug via INTRAVENOUS
  Filled 2017-09-12: qty 2

## 2017-09-12 MED ORDER — SODIUM CHLORIDE 0.9 % IV SOLN
1000.0000 mL | INTRAVENOUS | Status: DC
  Administered 2017-09-12: 1000 mL via INTRAVENOUS

## 2017-09-12 MED ORDER — VANCOMYCIN HCL IN DEXTROSE 1-5 GM/200ML-% IV SOLN: 1000.0000 mg | Freq: Three times a day (TID) | INTRAVENOUS | Status: DC

## 2017-09-12 MED ORDER — IOPAMIDOL (ISOVUE-300) INJECTION 61%
100.0000 mL | Freq: Once | INTRAVENOUS | Status: AC | PRN
  Administered 2017-09-12: 100 mL via INTRAVENOUS

## 2017-09-12 MED ORDER — SODIUM CHLORIDE 0.9 % IV SOLN
2.0000 g | Freq: Once | INTRAVENOUS | Status: AC
  Administered 2017-09-12: 2 g via INTRAVENOUS
  Filled 2017-09-12: qty 2

## 2017-09-12 MED ORDER — METRONIDAZOLE IN NACL 5-0.79 MG/ML-% IV SOLN
500.0000 mg | Freq: Three times a day (TID) | INTRAVENOUS | Status: DC
  Administered 2017-09-12: 500 mg via INTRAVENOUS
  Filled 2017-09-12: qty 100

## 2017-09-12 MED ORDER — ONDANSETRON 4 MG PO TBDP: 4.0000 mg | ORAL_TABLET | Freq: Three times a day (TID) | ORAL | 0 refills | Status: DC | PRN

## 2017-09-12 MED ORDER — ACETAMINOPHEN 500 MG PO TABS
1000.0000 mg | ORAL_TABLET | Freq: Once | ORAL | Status: AC
  Administered 2017-09-12: 1000 mg via ORAL
  Filled 2017-09-12: qty 2

## 2017-09-12 MED ORDER — SODIUM CHLORIDE 0.9 % IV SOLN: 2.0000 g | Freq: Two times a day (BID) | INTRAVENOUS | Status: DC

## 2017-09-12 MED ORDER — SODIUM CHLORIDE 0.9 % IV BOLUS
2000.0000 mL | Freq: Once | INTRAVENOUS | Status: AC
  Administered 2017-09-12: 2000 mL via INTRAVENOUS

## 2017-09-12 MED ORDER — ONDANSETRON HCL 4 MG/2ML IJ SOLN
4.0000 mg | Freq: Once | INTRAMUSCULAR | Status: AC
  Administered 2017-09-12: 4 mg via INTRAVENOUS
  Filled 2017-09-12: qty 2

## 2017-09-12 MED ORDER — IOPAMIDOL (ISOVUE-300) INJECTION 61%
INTRAVENOUS | Status: AC
  Filled 2017-09-12: qty 100

## 2017-09-12 NOTE — ED Notes (Signed)
Patient verbalizes understanding of medications and discharge instructions. No further questions at this time. VSS and patient ambulatory at discharge.   

## 2017-09-12 NOTE — Discharge Instructions (Addendum)
Your work up did not show any evidence of DKA, however your sugar was very high. Please start taking your insulin regimen as outlined by your doctor at today's visit. Stay well hydrated. Alternate between tylenol and ibuprofen as needed for pain/fever. Use zofran as directed as needed for nausea. Your labs were fairly reassuring, and your chest xray and abdominal CT scan were reassuring as well. We did not find a source of infection today, however you were given some antibiotics here in the ER; you are NOT being sent home on antibiotics since we didn't find a clear source, but if you have any changes/worsening symptoms then please return to the ER for further evaluation and management. Follow up with your primary care doctor in 2-3 days for recheck of symptoms. Return to the ER for emergent changes or worsening symptoms.

## 2017-09-12 NOTE — ED Provider Notes (Addendum)
[facility] EMERGENCY DEPARTMENT Provider Note   CSN: [id number] Arrival date & time: 09/12/17  1548     History   Chief Complaint Chief Complaint  Patient presents with  . Weakness  . Nausea    HPI Tina Saunders is a 32 y.o. female with a PMHx of chronic abd pain, DM2, HTN, PCOS, bipolar disorder, schizophrenia, and other conditions listed below, and PSHx of cholecystectomy, who presents to the ED with complaints of gradual onset fatigue that began around 2 PM.  Patient is a somewhat poor/difficult historian which makes her history taking somewhat difficult.  She states that she went to her PCPs office this morning around 10:45 AM and was feeling fine, but around 2 PM she started feeling generally weak/fatigued and was having subjective fever and chills.  At some point in time she started having chest pain although she is not sure exactly when it started, she describes this pain as 8/10 constant sharp nonradiating central chest pain with no known aggravating factors and unrelieved with ibuprofen 800 mg that she took about 2 hours prior to arrival.  She reports feeling somewhat lightheaded as well as nauseated.  She states that around 1 PM she checked her sugar and it was "high" so she took 22 units of NovoLog.  She also takes Levemir, took 70 units last night but has not taken any this morning.  Her PCP just increased her dose of both of these, she is going to start taking 25 units of NovoLog 3 times daily before meals and 80 units of Levemir twice daily, but she has not increased these yet.  She was also started on Ozempic but she has not taken that yet.  She had chicken nuggets sometime before checking her blood sugar but she is not sure exactly when.  She denies any URI symptoms, ear pain or drainage, cough, diaphoresis, SOB, LE swelling, recent travel/surgery/immobilization, personal or family history of DVT/PE, estrogen use, abdominal pain, vomiting, diarrhea, constipation,  melena, hematochezia, dysuria, hematuria, vaginal bleeding or discharge, myalgias, arthralgias, numbness, tingling, focal weakness, claudication, orthopnea, or any other complaints at this time.  She is a non-smoker.  No known sick contacts.  Her maternal grandfather had a CABG however no other known family history of cardiac disease aside from him.  The history is provided by the patient and medical records. No language interpreter was used.    Past Medical History:  Diagnosis Date  . Anxiety   . Asthma   . Bipolar 1 disorder ([place])   . Cancer of abdominal wall   . Depression   . Diabetes mellitus without complication ([place])   . High cholesterol   . Hypertension   . Intentional drug overdose ([place]) 07/26/2017   Seroquel and buspirone   . Obesity   . Obesity   . Polycystic ovarian syndrome 07/01/2011   Patient report  . 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 Schizophrenia ([place])   . Seizures [facility])     Patient Active Problem List   Diagnosis Date Noted  . Adjustment disorder with mixed disturbance of emotions and conduct   . QT prolongation   . Suicide attempt ([place])   . Overdose of antidepressant, intentional self-harm, initial encounter ([place])   . Overdose 07/26/2017  . Suicide attempt by other psychotropic drug overdose ([place])   . DKA (diabetic ketoacidoses) ([place]) 03/14/2017  . Chronic constipation   . Seizures ([place]) 12/11/2016  . DM2 (diabetes mellitus, type 2) ([place]) 12/11/2016  . Uncontrolled diabetes  mellitus ([place]) 11/03/2016  . Schizoaffective disorder, bipolar type ([place]) 11/02/2016  . Cluster B personality disorder ([place]) 11/02/2016  . Suicidal ideation   . 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 HTN (hypertension) 04/15/2013  . Post traumatic stress disorder 12/07/2011  . PSVT (paroxysmal supraventricular tachycardia) ([place]) 08/26/2011  . ADHD 09/23/2007  . EPIGASTRIC PAIN 09/23/2007  . Obesity, unspecified 07/30/2007  . Depression 07/30/2007  . SLEEP DISORDER 07/30/2007  .  METRORRHAGIA 06/13/2006  . DISORDER, MENSTRUAL NEC 06/13/2006  . POLYCYSTIC OVARIAN DISEASE 04/25/2006  . AMENORRHEA, SECONDARY 04/20/2006  . ACNE, MILD 04/20/2006    Past Surgical History:  Procedure Laterality Date  . CHOLECYSTECTOMY    . COLONOSCOPY WITH PROPOFOL N/A 03/08/2017   Procedure: COLONOSCOPY WITH PROPOFOL;  Surgeon: Milus Banister, MD;  Location: [facility] ENDOSCOPY;  Service: Endoscopy;  Laterality: N/A;  . HERNIA REPAIR    . Ovarian Cyst Excision    . VARICOSE VEIN SURGERY       OB History    Gravida  0   Para      Term      Preterm      AB      Living        SAB      TAB      Ectopic      Multiple      Live Births               Home Medications    Prior to Admission medications   Medication Sig Start Date End Date Taking? Authorizing Provider  atorvastatin (LIPITOR) 40 MG tablet Take 1 tablet (40 mg total) by mouth at bedtime. 07/30/17   Daisy Floro, DO  busPIRone (BUSPAR) 15 MG tablet Take 1 tablet (15 mg total) by mouth 3 (three) times daily. 07/30/17   Daisy Floro, DO  chlorproMAZINE (THORAZINE) 50 MG tablet Take 1 tablet (50 mg total) by mouth 3 (three) times daily. For agitation/mood control 02/09/17   Lindell Spar I, NP  gabapentin (NEURONTIN) 400 MG capsule Take 1 capsule (400 mg total) by mouth 3 (three) times daily. For agitation/diabetic neuropathy 02/09/17   Lindell Spar I, NP  insulin aspart (NOVOLOG) 100 UNIT/ML injection Inject 6 Units into the skin 3 (three) times daily with meals. For diabetes management 07/17/17   Montine Circle, PA-C  insulin detemir (LEVEMIR) 100 UNIT/ML injection Inject 0.68 mLs (68 Units total) into the skin 2 (two) times daily. For diabetes management 07/17/17   Langston Masker B, PA-C  Insulin Syringe-Needle U-100 25G X 1" 1 ML MISC 1 Syringe by Does not apply route 4 (four) times daily -  before meals and at bedtime. 07/17/17   Langston Masker B, PA-C  levETIRAcetam (KEPPRA) 500 MG tablet Take 1 tablet  (500 mg total) by mouth 2 (two) times daily. For mood stabilization 02/09/17   Lindell Spar I, NP  lisinopril (PRINIVIL,ZESTRIL) 10 MG tablet Take 1 tablet (10 mg total) by mouth daily. For high blood pressure 02/09/17   Lindell Spar I, NP  Magnesium 500 MG TABS Take 1 tablet (500 mg total) by mouth daily. 07/30/17   Daisy Floro, DO  Melatonin 5 MG TABS Take 5 mg by mouth at bedtime.    [provider]  metoprolol tartrate (LOPRESSOR) 25 MG tablet Take 1 tablet (25 mg total) by mouth 2 (two) times daily. 07/30/17   Daisy Floro, DO  QUEtiapine (SEROQUEL) 100 MG tablet Take 100 mg by mouth  daily.    [provider]  QUEtiapine (SEROQUEL) 200 MG tablet Take 500 mg by mouth at bedtime.    [provider]  sertraline (ZOLOFT) 100 MG tablet Take 200 mg by mouth daily.    [provider]  topiramate (TOPAMAX) 100 MG tablet Take 100 mg by mouth 2 (two) times daily.    [provider]  traZODone (DESYREL) 50 MG tablet Take 50 mg by mouth at bedtime.    [provider]    Family History Family History  Problem Relation Age of Onset  . Coronary artery disease Maternal Grandmother   . Diabetes type II Maternal Grandmother   . Cancer Maternal Grandmother   . Hypertension Mother   . Hypertension Father     Social History Social History   Tobacco Use  . Smoking status: Never Smoker  . Smokeless tobacco: Never Used  Substance Use Topics  . Alcohol use: No  . Drug use: No     Allergies   Fish-derived products; Geodon [ziprasidone hcl]; Haldol [haloperidol lactate]; Buprenorphine hcl; Compazine [prochlorperazine]; Morphine and related; and Toradol [ketorolac tromethamine]   Review of Systems Review of Systems  Constitutional: Positive for chills, fatigue and fever (subjective). Negative for diaphoresis.  HENT: Negative for ear discharge, ear pain, rhinorrhea and sore throat.   Respiratory: Negative for cough and shortness of  breath.   Cardiovascular: Positive for chest pain. Negative for leg swelling.  Gastrointestinal: Positive for nausea. Negative for abdominal pain, blood in stool, constipation, diarrhea and vomiting.  Genitourinary: Negative for dysuria, hematuria, vaginal bleeding and vaginal discharge.  Musculoskeletal: Negative for arthralgias and myalgias.  Skin: Negative for color change.  Allergic/Immunologic: Positive for immunocompromised state (DM2).  Neurological: Positive for light-headedness. Negative for weakness and numbness.  Psychiatric/Behavioral: Negative for confusion.   All other systems reviewed and are negative for acute change except as noted in the HPI.    Physical Exam Updated Vital Signs BP 107/67 (BP Location: Right Arm)   Pulse (!) 124   Temp 99.5 F (37.5 C) (Oral)   Resp 20   SpO2 94%   Physical Exam  Constitutional: She is oriented to person, place, and time. She appears well-developed and well-nourished.  Non-toxic appearance. No distress.  Low grade temp 99.5, but rectal temp 100.2, nontoxic, NAD however looks like she doesn't feel well  HENT:  Head: Normocephalic and atraumatic.  Mouth/Throat: Oropharynx is clear and moist. Mucous membranes are dry.  Dry lips  Eyes: Conjunctivae and EOM are normal. Right eye exhibits no discharge. Left eye exhibits no discharge.  Neck: Normal range of motion. Neck supple.  Cardiovascular: Regular rhythm, normal heart sounds and intact distal pulses. Tachycardia present. Exam reveals no gallop and no friction rub.  No murmur heard. HR 120s which has been noted during a few prior visits but isn't her usual; reg rhythm, nl s1/s2, no m/r/g, distal pulses intact, no pedal edema   Pulmonary/Chest: Effort normal and breath sounds normal. No respiratory distress. She has no decreased breath sounds. She has no wheezes. She has no rhonchi. She has no rales. She exhibits tenderness. She exhibits no crepitus, no deformity and no retraction.    CTAB in all lung fields, no w/r/r, no hypoxia or increased WOB, speaking in full sentences, SpO2 94% on RA Chest wall with diffuse TTP anteriorly, without crepitus, deformities, or retractions     Abdominal: Soft. Normal appearance and bowel sounds are normal. She exhibits no distension. There is tenderness in the right  lower quadrant. There is tenderness at McBurney's point. There is no rigidity, no rebound, no guarding, no CVA tenderness and negative Murphy's sign.  Soft, obese but nondistended, +BS throughout, with mild RLQ TTP over mcburney's point, no r/g/r, neg murphy's, no CVA TTP   Musculoskeletal: Normal range of motion.  MAE x4 Strength and sensation grossly intact in all extremities Distal pulses intact Gait steady No pedal edema, neg homan's bilaterally   Neurological: She is alert and oriented to person, place, and time. She has normal strength. No sensory deficit.  Skin: Skin is warm, dry and intact. No rash noted.  Psychiatric: She has a normal mood and affect.  Nursing note and vitals reviewed.    ED Treatments / Results  Labs (all labs ordered are listed, but only abnormal results are displayed) Labs Reviewed  CBC WITH DIFFERENTIAL/PLATELET - Abnormal; Notable for the following components:      Result Value   Hemoglobin 11.1 (*)    MCH 25.3 (*)    Platelets 103 (*)    All other components within normal limits  COMPREHENSIVE METABOLIC PANEL - Abnormal; Notable for the following components:   Sodium 130 (*)    Chloride 96 (*)    CO2 20 (*)    Glucose, Bld 556 (*)    BUN <5 (*)    Calcium 8.8 (*)    AST 42 (*)    All other components within normal limits  URINALYSIS, ROUTINE W REFLEX MICROSCOPIC - Abnormal; Notable for the following components:   APPearance CLOUDY (*)    Glucose, UA >=500 (*)    Hgb urine dipstick MODERATE (*)    Leukocytes, UA MODERATE (*)    Bacteria, UA FEW (*)    All other components within normal limits  MAGNESIUM - Abnormal; Notable  for the following components:   Magnesium 1.6 (*)    All other components within normal limits  CBG MONITORING, ED - Abnormal; Notable for the following components:   Glucose-Capillary 592 (*)    All other components within normal limits  I-STAT VENOUS BLOOD GAS, ED - Abnormal; Notable for the following components:   pCO2, Ven 35.6 (*)    pO2, Ven 75.0 (*)    Acid-base deficit 4.0 (*)    All other components within normal limits  I-STAT CG4 LACTIC ACID, ED - Abnormal; Notable for the following components:   Lactic Acid, Venous 3.85 (*)    All other components within normal limits  I-STAT CG4 LACTIC ACID, ED - Abnormal; Notable for the following components:   Lactic Acid, Venous 2.01 (*)    All other components within normal limits  URINE CULTURE  CULTURE, BLOOD (ROUTINE X 2)  CULTURE, BLOOD (ROUTINE X 2)  LIPASE, BLOOD  PHOSPHORUS  I-STAT BETA HCG BLOOD, ED ([facility], [facility], [facility] ONLY)  I-STAT TROPONIN, ED  I-STAT CG4 LACTIC ACID, ED    EKG EKG Interpretation  Date/Time:  Wednesday September 12 2017 16:53:00 EDT Ventricular Rate:  122 PR Interval:    QRS Duration: 93 QT Interval:  318 QTC Calculation: 453 R Axis:   -14 Text Interpretation:  Sinus tachycardia Low voltage, extremity and precordial leads Abnormal Q suggests anterior infarct Baseline wander in lead(s) II III aVF V4 V5 rate increased since July 2019 Confirmed by Sherwood Gambler [phone]) on 09/12/2017 7:42:26 PM   Radiology No results found.  Procedures Procedures (including critical care time)  CRITICAL CARE-sepsis criteria met Performed by: Reece Agar   Total critical care time: 35 minutes  Critical care time was exclusive of separately billable procedures and treating other patients.  Critical care was necessary to treat or prevent imminent or life-threatening deterioration.  Critical care was time spent personally by me on the following activities: development of treatment plan with patient and/or surrogate  as well as nursing, discussions with consultants, evaluation of patient's response to treatment, examination of patient, obtaining history from patient or surrogate, ordering and performing treatments and interventions, ordering and review of laboratory studies, ordering and review of radiographic studies, pulse oximetry and re-evaluation of patient's condition.   Medications Ordered in ED Medications  0.9 %  sodium chloride infusion (1,000 mLs Intravenous New Bag/Given 09/12/17 1929)  metroNIDAZOLE (FLAGYL) IVPB 500 mg (500 mg Intravenous New Bag/Given 09/12/17 1904)  iopamidol (ISOVUE-300) 61 % injection (has no administration in time range)  vancomycin (VANCOCIN) IVPB 1000 mg/200 mL premix (has no administration in time range)  ceFEPIme (MAXIPIME) 2 g in sodium chloride 0.9 % 100 mL IVPB (has no administration in time range)  fentaNYL (SUBLIMAZE) injection 50 mcg (50 mcg Intravenous Given 09/12/17 1703)  ondansetron (ZOFRAN) injection 4 mg (4 mg Intravenous Given 09/12/17 1703)  sodium chloride 0.9 % bolus 2,000 mL (0 mLs Intravenous Stopped 09/12/17 1801)  ceFEPIme (MAXIPIME) 2 g in sodium chloride 0.9 % 100 mL IVPB (0 g Intravenous Stopped 09/12/17 1751)  vancomycin (VANCOCIN) IVPB 1000 mg/200 mL premix (0 mg Intravenous Stopped 09/12/17 1819)  acetaminophen (TYLENOL) tablet 1,000 mg (1,000 mg Oral Given 09/12/17 1859)  iopamidol (ISOVUE-300) 61 % injection 100 mL (100 mLs Intravenous Contrast Given 09/12/17 1937)     Initial Impression / Assessment and Plan / ED Course  I have reviewed the triage vital signs and the nursing notes.  Pertinent labs & imaging results that were available during my care of the patient were reviewed by me and considered in my medical decision making (see chart for details).     32 y.o. female here with fatigue/nausea/CP that began about 2hrs prior to arrival, also started having subjective fever and chills. On arrival, temp 99.5 but will get rectal temp as pt feels  warm; HR 120s which she's had a few times in the past but isn't her consistent baseline, diffuse central chest TTP, clear lungs, mild RLQ TTP over mcburney's point although nonperitoneal (and pt not complaining of abd pain), no pedal edema, neg homan's bilaterally. Hard to say what's going on, will look for areas of infection, will get labs to check for DKA, give fluids and pain/nausea meds, and reassess. Not currently meeting sepsis criteria, if rectal temp elevated then would meet these criteria; will reassess after rectal temp. Doubt PE/dissection, doubt need for D-dimer at this time.   4:45 PM Rectal temp 100.2, will call code sepsis given that this is AFTER ibuprofen was given 2hrs ago. Empiric abx started, fluids going (adjusted for weight, will give 2L). Will reassess shortly.   7:30 PM CBC w/diff with stable anemia and thrombocytopenia, no leukocytosis although WBC on low end of normal at 4.0. CMP with gluc 556, bicarb slightly low at 20 but normal anion gap although on the high end of normal, CMP otherwise fairly unremarkable. Lipase WNL. Mg/Phos levels essentially WNL. U/A grossly contaminated with 11-20 squamous however no definite signs of infection, nitrite neg, moderate leuks, 6-10 WBCs, but few bacteria, and given level of contamination doubt this represents UTI; UCx sent; no ketones noted. BetaHCG neg. Lactic elevated at 3.85, fluids running. VBG without abnormal pH. Trop neg. EKG nonischemic.  CXR negative.  Pt going to CT now. Will reassess shortly.   8:49 PM CT abd/pelv without acute findings, hepatomegaly and splenomegaly noted. Second lactic downtrending, now 2.01. VS improving with fluids. Pt feeling much better and tolerating PO well here, now sitting up in bed on her laptop, looking much better than when she arrived. States that she wants to go home. Unclear source of infection/fever, however pt feeling much better and doesn't want to stay for further work up/admission. Lactic  trending down which is also reassuring; some of her SIRS/sepsis criteria could have been just from dehydration from hyperglycemia. Doubt we need to keep her here longer, her chest pain was likely musculoskeletal and pt low risk so doubt need for further work up of this; her other symptoms were likely from her level of hyperglycemia and dehydration; the only unexplained portion of today's visit is the fever. She was advised today to increase her insulin regimen, advised that she do this starting immediately, but given that she isn't in frank DKA I doubt we need to keep her in the hospital. No clear source of infection, so doubt need for sending home on abx, but strict return precautions advised. Discussed staying hydrated, alternating between tylenol/ibuprofen for pain/fever, and close PCP f/up in 2-3 days for recheck. Advised continuation of all home meds. Rx zofran given for nausea. Discussed case with my attending Dr. Regenia Skeeter who agrees with plan.   I explained the diagnosis and have given explicit precautions to return to the ER including for any other new or worsening symptoms. The patient understands and accepts the medical plan as it's been dictated and I have answered their questions. Discharge instructions concerning home care and prescriptions have been given. The patient is STABLE and is discharged to home in good condition.    Final Clinical Impressions(s) / ED Diagnoses   Final diagnoses:  Atypical chest pain  Right lower quadrant abdominal tenderness without rebound tenderness  Type 2 diabetes mellitus with hyperglycemia, with long-term current use of insulin (HCC)  Nausea  Chronic anemia  Fever of unknown origin  Thrombocytopenia (HCC)  Elevated lactic acid level    ED Discharge Orders         Ordered    ondansetron (ZOFRAN ODT) 4 MG disintegrating tablet  Every 8 hours PRN     09/12/17 2051             Cheryl Stabenow, [place], [place] 09/12/17 2054    Sherwood Gambler,  MD 09/12/17 2136

## 2017-09-12 NOTE — Progress Notes (Signed)
Pharmacy Antibiotic Note  Tina Saunders is a 32 y.o. female admitted on 09/12/2017 with possible sepsis. Complaints of general weakness and nausea that began today. Glucose >500 on admit. Tmax 100.2, WBC wnl, lactate 3.85, complaints of abdominal pain on exam. CrCl ~116.68mL/min. Pharmacy has been consulted for vancomycin and cefepime dosing.  Plan: Given Vancomycin IV 1000mg  x1  Maintenance vancomycin IV 1000mg  q8h Cefepime IV 2g q12h F/u cultures, s/sx of infectious resolution, drug levels as indicated.  Height: 5\' 4"  (162.6 cm) Weight: 215 lb (97.5 kg) IBW/kg (Calculated) : 54.7  Temp (24hrs), Avg:99.9 F (37.7 C), Min:99.5 F (37.5 C), Max:100.2 F (37.9 C)  Recent Labs  Lab 09/12/17 1646 09/12/17 1658  WBC 4.0  --   LATICACIDVEN  --  3.85*    CrCl cannot be calculated (Patient's most recent lab result is older than the maximum 21 days allowed.).    Allergies  Allergen Reactions  . Fish-Derived Products Anaphylaxis    Can only eat FLounder  . Geodon [Ziprasidone Hcl] Other (See Comments)    Face pulls, cant swallow - Locked Jaw  . Haldol [Haloperidol Lactate] Other (See Comments)    Face pulls, can't swallow - Locked Jaw  . Buprenorphine Hcl Hives, Itching, Rash and Other (See Comments)    GI upset  . Compazine [Prochlorperazine] Other (See Comments)    anxiety and hyperactivity  . Morphine And Related Hives, Itching, Rash and Other (See Comments)    GI upset  . Toradol [Ketorolac Tromethamine] Other (See Comments)    Anxiety and hyperactivity    Antimicrobials this admission: Vancomycin 8/21 >>  Cefepime  8/21 >>  Flagyl 8/21 >>   Thank you for allowing pharmacy to be a part of this patient's care.  Janae Bridgeman, PharmD PGY1 Pharmacy Resident Phone: [phone] 09/12/2017 6:02 PM`

## 2017-09-12 NOTE — ED Notes (Signed)
Patient transported to CT 

## 2017-09-12 NOTE — ED Notes (Signed)
Pt left for Xray.

## 2017-09-12 NOTE — ED Triage Notes (Signed)
Pt arrived from home c/o weakness and nausea that began today. Pt has hx of DM2, EMS cbg reading was "high". Pt is alert and oriented x4. She was seen at her MD office this morning, office paperwork at bedside. EMS v/s 118/76, 98%ra, hr 125.

## 2017-09-14 ENCOUNTER — Telehealth (HOSPITAL_BASED_OUTPATIENT_CLINIC_OR_DEPARTMENT_OTHER): Payer: Self-pay | Admitting: *Deleted

## 2017-09-14 LAB — URINE CULTURE: Culture: 5000 — AB

## 2017-09-15 ENCOUNTER — Telehealth: Payer: Self-pay

## 2017-09-15 NOTE — Telephone Encounter (Signed)
No treatment for UC from ED 09/12/17 per Alferd Apa PA

## 2017-09-15 NOTE — Progress Notes (Signed)
ED Antimicrobial Stewardship Positive Culture Follow Up   Tina Saunders is an 32 y.o. female who presented to [facility] on 09/12/2017 with a chief complaint of fatigue; also CP, nausea, subjective fever and chills.  Recent Results (from the past 720 hour(s))  Urine Culture     Status: Abnormal   Collection Time: 09/12/17  4:33 PM  Result Value Ref Range Status   Specimen Description URINE, RANDOM  Final   Special Requests NONE  Final   Culture (A)  Final    5,000 COLONIES/mL GROUP B STREP(S.AGALACTIAE)ISOLATED TESTING AGAINST S. AGALACTIAE NOT ROUTINELY PERFORMED DUE TO PREDICTABILITY OF AMP/PEN/VAN SUSCEPTIBILITY. CRITICAL RESULT CALLED TO, READ BACK BY AND VERIFIED WITH:  RN A HARTLEY [id number] Performed at [facility], [place] [address], [place], [place] [address]    Report Status 09/14/2017 FINAL  Final  Blood Culture (routine x 2)     Status: None (Preliminary result)   Collection Time: 09/12/17  5:00 PM  Result Value Ref Range Status   Specimen Description BLOOD BLOOD RIGHT FOREARM  Final   Special Requests   Final    BOTTLES DRAWN AEROBIC AND ANAEROBIC Blood Culture adequate volume   Culture   Final    NO GROWTH 2 DAYS Performed at [facility], [place] [address], [place], [place] [address]    Report Status PENDING  Incomplete  Blood Culture (routine x 2)     Status: None (Preliminary result)   Collection Time: 09/12/17  5:18 PM  Result Value Ref Range Status   Specimen Description BLOOD LEFT ANTECUBITAL  Final   Special Requests   Final    BOTTLES DRAWN AEROBIC AND ANAEROBIC Blood Culture adequate volume   Culture   Final    NO GROWTH 2 DAYS Performed at [facility], [place] [address], [place], [place] [address]    Report Status PENDING  Incomplete   UCx results likely represent contamination or colonization given lack of urinary sx, high squamous cell count on UA, and low colony count on UCx.  No treatment needed.  ED Provider: Alferd Apa,  PA-C  Mila Merry Gerarda Fraction, PharmD PGY2 Infectious Diseases Pharmacy Resident Phone: [phone] 09/15/2017, 9:20 AM

## 2017-09-17 LAB — CULTURE, BLOOD (ROUTINE X 2)
CULTURE: NO GROWTH
CULTURE: NO GROWTH
SPECIAL REQUESTS: ADEQUATE
Special Requests: ADEQUATE

## 2017-10-28 ENCOUNTER — Encounter (HOSPITAL_COMMUNITY): Payer: Self-pay | Admitting: Emergency Medicine

## 2017-10-28 ENCOUNTER — Emergency Department (HOSPITAL_COMMUNITY)
Admission: EM | Admit: 2017-10-28 | Discharge: 2017-10-28 | Disposition: A | Discharge status: Home / Self Care | Payer: Medicare HMO | Attending: Emergency Medicine | Admitting: Emergency Medicine

## 2017-10-28 ENCOUNTER — Other Ambulatory Visit: Payer: Self-pay

## 2017-10-28 ENCOUNTER — Emergency Department (HOSPITAL_COMMUNITY): Payer: Medicare HMO

## 2017-10-28 DIAGNOSIS — R531 Weakness: Secondary | ICD-10-CM | POA: Diagnosis present

## 2017-10-28 DIAGNOSIS — Z79899 Other long term (current) drug therapy: Secondary | ICD-10-CM | POA: Diagnosis not present

## 2017-10-28 DIAGNOSIS — I10 Essential (primary) hypertension: Secondary | ICD-10-CM | POA: Insufficient documentation

## 2017-10-28 DIAGNOSIS — R1031 Right lower quadrant pain: Secondary | ICD-10-CM | POA: Diagnosis not present

## 2017-10-28 DIAGNOSIS — F909 Attention-deficit hyperactivity disorder, unspecified type: Secondary | ICD-10-CM | POA: Diagnosis not present

## 2017-10-28 DIAGNOSIS — Z9119 Patient's noncompliance with other medical treatment and regimen: Secondary | ICD-10-CM | POA: Diagnosis not present

## 2017-10-28 DIAGNOSIS — Z794 Long term (current) use of insulin: Secondary | ICD-10-CM | POA: Insufficient documentation

## 2017-10-28 DIAGNOSIS — R0902 Hypoxemia: Secondary | ICD-10-CM | POA: Diagnosis not present

## 2017-10-28 DIAGNOSIS — R52 Pain, unspecified: Secondary | ICD-10-CM | POA: Diagnosis not present

## 2017-10-28 DIAGNOSIS — Z9114 Patient's other noncompliance with medication regimen: Secondary | ICD-10-CM

## 2017-10-28 DIAGNOSIS — E1165 Type 2 diabetes mellitus with hyperglycemia: Secondary | ICD-10-CM | POA: Diagnosis not present

## 2017-10-28 DIAGNOSIS — K760 Fatty (change of) liver, not elsewhere classified: Secondary | ICD-10-CM | POA: Diagnosis not present

## 2017-10-28 DIAGNOSIS — R110 Nausea: Secondary | ICD-10-CM | POA: Diagnosis not present

## 2017-10-28 DIAGNOSIS — J45909 Unspecified asthma, uncomplicated: Secondary | ICD-10-CM | POA: Diagnosis not present

## 2017-10-28 DIAGNOSIS — R1084 Generalized abdominal pain: Secondary | ICD-10-CM | POA: Diagnosis not present

## 2017-10-28 DIAGNOSIS — R739 Hyperglycemia, unspecified: Secondary | ICD-10-CM

## 2017-10-28 LAB — COMPREHENSIVE METABOLIC PANEL
ALBUMIN: 3.4 g/dL — AB (ref 3.5–5.0)
ALT: 21 U/L (ref 0–44)
ANION GAP: 11 (ref 5–15)
AST: 35 U/L (ref 15–41)
Alkaline Phosphatase: 96 U/L (ref 38–126)
BILIRUBIN TOTAL: 0.6 mg/dL (ref 0.3–1.2)
BUN: <5 mg/dL — ABNORMAL LOW (ref 6–20)
CO2: 24 mmol/L (ref 22–32)
Calcium: 8.5 mg/dL — ABNORMAL LOW (ref 8.9–10.3)
Chloride: 96 mmol/L — ABNORMAL LOW (ref 98–111)
Creatinine, Ser: 0.53 mg/dL (ref 0.44–1.00)
GFR calc Af Amer: >60 mL/min (ref >60)
GFR calc non Af Amer: >60 mL/min (ref >60)
Glucose, Bld: 482 mg/dL — ABNORMAL HIGH (ref 70–99)
POTASSIUM: 4 mmol/L (ref 3.5–5.1)
Sodium: 131 mmol/L — ABNORMAL LOW (ref 135–145)
TOTAL PROTEIN: 6.6 g/dL (ref 6.5–8.1)

## 2017-10-28 LAB — I-STAT BETA HCG BLOOD, ED (MC, WL, AP ONLY)

## 2017-10-28 LAB — CBC
HCT: 39.6 % (ref 36.0–46.0)
Hemoglobin: 12 g/dL (ref 12.0–15.0)
MCH: 25 pg — ABNORMAL LOW (ref 26.0–34.0)
MCHC: 30.3 g/dL (ref 30.0–36.0)
MCV: 82.5 fL (ref 78.0–100.0)
PLATELETS: 111 10*3/uL — AB (ref 150–400)
RBC: 4.8 MIL/uL (ref 3.87–5.11)
RDW: 15.4 % (ref 11.5–15.5)
WBC: 4.6 10*3/uL (ref 4.0–10.5)

## 2017-10-28 LAB — URINALYSIS, ROUTINE W REFLEX MICROSCOPIC
BACTERIA UA: NONE SEEN
BILIRUBIN URINE: NEGATIVE
Glucose, UA: >=500 mg/dL — AB
Ketones, ur: NEGATIVE mg/dL
Nitrite: NEGATIVE
PH: 6 (ref 5.0–8.0)
Protein, ur: NEGATIVE mg/dL
SPECIFIC GRAVITY, URINE: 1.032 — AB (ref 1.005–1.030)

## 2017-10-28 LAB — LIPASE, BLOOD: Lipase: 31 U/L (ref 11–51)

## 2017-10-28 LAB — CBG MONITORING, ED: GLUCOSE-CAPILLARY: 379 mg/dL — AB (ref 70–99)

## 2017-10-28 MED ORDER — INSULIN DETEMIR 100 UNIT/ML ~~LOC~~ SOLN
68.0000 [IU] | Freq: Two times a day (BID) | SUBCUTANEOUS | Status: AC
  Administered 2017-10-28: 68 [IU] via SUBCUTANEOUS
  Filled 2017-10-28: qty 0.68

## 2017-10-28 MED ORDER — IOHEXOL 300 MG/ML  SOLN
100.0000 mL | Freq: Once | INTRAMUSCULAR | Status: AC | PRN
  Administered 2017-10-28: 100 mL via INTRAVENOUS

## 2017-10-28 MED ORDER — INSULIN DETEMIR 100 UNIT/ML ~~LOC~~ SOLN: 68.0000 [IU] | Freq: Two times a day (BID) | SUBCUTANEOUS | Status: DC

## 2017-10-28 MED ORDER — SODIUM CHLORIDE 0.9 % IV BOLUS
1000.0000 mL | Freq: Once | INTRAVENOUS | Status: AC
  Administered 2017-10-28: 1000 mL via INTRAVENOUS

## 2017-10-28 MED ORDER — MORPHINE SULFATE (PF) 4 MG/ML IV SOLN
4.0000 mg | Freq: Once | INTRAVENOUS | Status: AC
  Administered 2017-10-28: 4 mg via INTRAVENOUS
  Filled 2017-10-28: qty 1

## 2017-10-28 NOTE — ED Triage Notes (Signed)
Pt presents with weakness, nausea, RLQ abd pain and hyperglycemia since 10 am this morning; pt reports she is a noncompliant diabetic with reported [id number] with EMS, patient states"this is low for me". "I dont check my sugar or take my insulin unless I feel bad"; pt administered herself 40 units novolog today at 1330; EMS gave 4 mg zofran and 500cc NS

## 2017-10-28 NOTE — Discharge Instructions (Signed)
Thank you for allowing me to care for you today in the Emergency Department.   Please call and schedule a follow-up appointment with your primary care provider.  It is very important that you get good control of your blood sugar at home by taking her home medications as prescribed.  If you enjoy drinking soda and sweet tea and cannot cut those out of your diet, try switching to a non-sweet tea or to diet soda.  Please follow-up with your primary care provider regarding the discomfort in her right lower abdomen.  You can take 650 mg of Tylenol once every 6 hours for pain control.   Return to the emergency department for reevaluation.  You were given your nighttime dose of Levemir tonight in the emergency department so do not restart this medication until tomorrow morning.

## 2017-10-28 NOTE — ED Provider Notes (Signed)
[facility] EMERGENCY DEPARTMENT Provider Note   CSN: [id number] Arrival date & time: 10/28/17  1510     History   Chief Complaint Chief Complaint  Patient presents with  . Weakness  . Hyperglycemia  . Nausea  . Abdominal Pain    HPI Tina Saunders is a 32 y.o. female with a history of diabetes mellitus type 2, rhabdosarcoma in remission, bipolar 1 disorder, PCOS, and schizophrenia who presents to the emergency department by EMS from home with a chief complaint of generalized weakness, nausea, and right lower quadrant pain that began at approximately 10 AM this morning.  She reports that she has concerns about reading online that she will overdose on insulin so she is afraid to take it.  She states that she has not taken her home insulin in over 3 weeks until after she began feeling unwell around 10 AM when she took 40 units of NovoLog at approximately 1330.  She states that her CBG prior to taking NovoLog was 560, which improved to 448 by the time EMS arrived.  She reports that her sugars have been running in the 600s for the last few weeks.  She also reports that she drinks approximately 636 regular sodas and 2 gallons of sweet tea on a weekly basis.  She states I am not sure what to do if my sugar goes low so I just do not take my insulin unless I start to feel bad like I did this morning.  She also describes constant, sharp, achy right lower quadrant pain.  Pain is nonradiating.  She states I have had pain from cyst on my ovaries, but this feels different.  She also reports nausea and nonbloody, nonbilious emesis.  She was given 4 mg of Zofran and 500 cc of normal saline by EMS in route.  She reports that her nausea has resolved and she has had no further episodes of vomiting.  She denies fevers, chills, dysuria, hematuria, vaginal pain, bleeding, discharge, rashes, chest pain, dyspnea, melena, or hematochezia.  The history is provided by the patient. No language  interpreter was used.    Past Medical History:  Diagnosis Date  . Anxiety   . Asthma   . Bipolar 1 disorder ([place])   . Cancer of abdominal wall   . Depression   . Diabetes mellitus without complication ([place])   . High cholesterol   . Hypertension   . Intentional drug overdose ([place]) 07/26/2017   Seroquel and buspirone   . Obesity   . Obesity   . Polycystic ovarian syndrome 07/01/2011   Patient report  . Rhabdosarcoma ([place])   . Schizophrenia ([place])   . Seizures [facility])     Patient Active Problem List   Diagnosis Date Noted  . Adjustment disorder with mixed disturbance of emotions and conduct   . QT prolongation   . Suicide attempt ([place])   . Overdose of antidepressant, intentional self-harm, initial encounter ([place])   . Overdose 07/26/2017  . Suicide attempt by other psychotropic drug overdose ([place])   . DKA (diabetic ketoacidoses) ([place]) 03/14/2017  . Chronic constipation   . Seizures ([place]) 12/11/2016  . DM2 (diabetes mellitus, type 2) ([place]) 12/11/2016  . Uncontrolled diabetes mellitus ([place]) 11/03/2016  . Schizoaffective disorder, bipolar type ([place]) 11/02/2016  . Cluster B personality disorder ([place]) 11/02/2016  . Suicidal ideation   . 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 HTN (hypertension) 04/15/2013  . Post traumatic stress disorder 12/07/2011  . PSVT (  paroxysmal supraventricular tachycardia) ([place]) 08/26/2011  . ADHD 09/23/2007  . EPIGASTRIC PAIN 09/23/2007  . Obesity, unspecified 07/30/2007  . Depression 07/30/2007  . SLEEP DISORDER 07/30/2007  . METRORRHAGIA 06/13/2006  . DISORDER, MENSTRUAL NEC 06/13/2006  . POLYCYSTIC OVARIAN DISEASE 04/25/2006  . AMENORRHEA, SECONDARY 04/20/2006  . ACNE, MILD 04/20/2006    Past Surgical History:  Procedure Laterality Date  . CHOLECYSTECTOMY    . COLONOSCOPY WITH PROPOFOL N/A 03/08/2017   Procedure: COLONOSCOPY WITH PROPOFOL;  Surgeon: Milus Banister, MD;  Location: [facility] ENDOSCOPY;  Service: Endoscopy;  Laterality: N/A;  .  HERNIA REPAIR    . Ovarian Cyst Excision    . VARICOSE VEIN SURGERY       OB History    Gravida  0   Para      Term      Preterm      AB      Living        SAB      TAB      Ectopic      Multiple      Live Births               Home Medications    Prior to Admission medications   Medication Sig Start Date End Date Taking? Authorizing Provider  atorvastatin (LIPITOR) 40 MG tablet Take 1 tablet (40 mg total) by mouth at bedtime. 07/30/17   Daisy Floro, DO  busPIRone (BUSPAR) 15 MG tablet Take 1 tablet (15 mg total) by mouth 3 (three) times daily. 07/30/17   Daisy Floro, DO  chlorproMAZINE (THORAZINE) 50 MG tablet Take 1 tablet (50 mg total) by mouth 3 (three) times daily. For agitation/mood control 02/09/17   Lindell Spar I, NP  gabapentin (NEURONTIN) 400 MG capsule Take 1 capsule (400 mg total) by mouth 3 (three) times daily. For agitation/diabetic neuropathy 02/09/17   Lindell Spar I, NP  insulin aspart (NOVOLOG) 100 UNIT/ML injection Inject 6 Units into the skin 3 (three) times daily with meals. For diabetes management Patient taking differently: Inject 25 Units into the skin 3 (three) times daily with meals. For diabetes management 07/17/17   Montine Circle, PA-C  insulin detemir (LEVEMIR) 100 UNIT/ML injection Inject 0.68 mLs (68 Units total) into the skin 2 (two) times daily. For diabetes management Patient taking differently: Inject 80 Units into the skin 2 (two) times daily. For diabetes management 07/17/17   Langston Masker B, PA-C  Insulin Syringe-Needle U-100 25G X 1" 1 ML MISC 1 Syringe by Does not apply route 4 (four) times daily -  before meals and at bedtime. 07/17/17   Langston Masker B, PA-C  levETIRAcetam (KEPPRA) 500 MG tablet Take 1 tablet (500 mg total) by mouth 2 (two) times daily. For mood stabilization 02/09/17   Lindell Spar I, NP  lisinopril (PRINIVIL,ZESTRIL) 10 MG tablet Take 1 tablet (10 mg total) by mouth daily. For high blood pressure  02/09/17   Lindell Spar I, NP  Magnesium 500 MG TABS Take 1 tablet (500 mg total) by mouth daily. 07/30/17   Daisy Floro, DO  Melatonin 5 MG TABS Take 5 mg by mouth at bedtime.    [provider]  metoprolol tartrate (LOPRESSOR) 25 MG tablet Take 1 tablet (25 mg total) by mouth 2 (two) times daily. 07/30/17   Daisy Floro, DO  ondansetron (ZOFRAN ODT) 4 MG disintegrating tablet Take 1 tablet (4 mg total) by mouth every 8 (eight) hours as needed for nausea  or vomiting. 09/12/17   Street, [place], PA-C  QUEtiapine (SEROQUEL) 100 MG tablet Take 100 mg by mouth daily.    [provider]  QUEtiapine (SEROQUEL) 200 MG tablet Take 500 mg by mouth at bedtime.    [provider]  Semaglutide (OZEMPIC) 0.25 or 0.5 MG/DOSE SOPN Inject 1 each into the skin once a week.    [provider]  sertraline (ZOLOFT) 100 MG tablet Take 100 mg by mouth daily.     [provider]  topiramate (TOPAMAX) 100 MG tablet Take 100 mg by mouth daily.     [provider]  traZODone (DESYREL) 50 MG tablet Take 50 mg by mouth at bedtime.    [provider]  Vitamin D, Ergocalciferol, (DRISDOL) 50000 units CAPS capsule Take 50,000 Units by mouth every 7 (seven) days.    [provider]    Family History Family History  Problem Relation Age of Onset  . Coronary artery disease Maternal Grandmother   . Diabetes type II Maternal Grandmother   . Cancer Maternal Grandmother   . Hypertension Mother   . Hypertension Father     Social History Social History   Tobacco Use  . Smoking status: Never Smoker  . Smokeless tobacco: Never Used  Substance Use Topics  . Alcohol use: No  . Drug use: No     Allergies   Fish-derived products; Geodon [ziprasidone hcl]; Haldol [haloperidol lactate]; Buprenorphine hcl; Compazine [prochlorperazine]; Morphine and related; and Toradol [ketorolac tromethamine]   Review of Systems Review of Systems    Constitutional: Negative for activity change, chills and fever.       Malaise  Respiratory: Negative for shortness of breath.   Cardiovascular: Negative for chest pain.  Gastrointestinal: Positive for abdominal pain, nausea and vomiting. Negative for constipation and diarrhea.  Genitourinary: Negative for dysuria, frequency, urgency, vaginal bleeding, vaginal discharge and vaginal pain.  Musculoskeletal: Negative for back pain and myalgias.  Skin: Negative for rash.  Neurological: Negative for weakness and numbness.     Physical Exam Updated Vital Signs BP 132/83 (BP Location: Right Arm)   Pulse (!) 103   Temp 98.7 F (37.1 C) (Oral)   Resp 16   Ht 5\' 4"  (1.626 m)   Wt 95.3 kg   LMP 09/29/2017   SpO2 97%   BMI 36.05 kg/m   Physical Exam  Constitutional: No distress.  Patient is sitting crosslegged on her bed listening to music and playing on her computer.  HENT:  Head: Normocephalic.  Eyes: Conjunctivae are normal.  Neck: Neck supple.  Cardiovascular: Normal rate, regular rhythm, normal heart sounds and intact distal pulses. Exam reveals no gallop and no friction rub.  No murmur heard. Pulmonary/Chest: Effort normal and breath sounds normal. No stridor. No respiratory distress. She has no wheezes. She has no rales. She exhibits no tenderness.  Abdominal: Soft. Bowel sounds are normal. She exhibits no distension and no mass. There is tenderness. There is no rebound and no guarding. No hernia.  Abdomen is obese.  Abdomen is soft, nondistended.  She has tenderness to palpation in the right lower quadrant.  She is tender over McBurney's point.  No rebound or guarding.  Negative Rovsing sign.  The remainder of the abdominal exam is unremarkable.  No CVA tenderness bilaterally.  Musculoskeletal: She exhibits no edema, tenderness or deformity.  Neurological: She is alert.  Skin: Skin is warm. No rash noted.  Psychiatric: Her behavior is normal.  Nursing note and vitals  reviewed.  ED Treatments / Results  Labs (all labs ordered are listed, but only abnormal results are displayed) Labs Reviewed  COMPREHENSIVE METABOLIC PANEL - Abnormal; Notable for the following components:      Result Value   Sodium 131 (*)    Chloride 96 (*)    Glucose, Bld 482 (*)    BUN <5 (*)    Calcium 8.5 (*)    Albumin 3.4 (*)    All other components within normal limits  CBC - Abnormal; Notable for the following components:   MCH 25.0 (*)    Platelets 111 (*)    All other components within normal limits  URINALYSIS, ROUTINE W REFLEX MICROSCOPIC - Abnormal; Notable for the following components:   APPearance HAZY (*)    Specific Gravity, Urine 1.032 (*)    Glucose, UA >=500 (*)    Hgb urine dipstick MODERATE (*)    Leukocytes, UA LARGE (*)    All other components within normal limits  CBG MONITORING, ED - Abnormal; Notable for the following components:   Glucose-Capillary 379 (*)    All other components within normal limits  URINE CULTURE  LIPASE, BLOOD  I-STAT BETA HCG BLOOD, ED ([facility], [facility], [facility] ONLY)    EKG None  Radiology Ct Abdomen Pelvis W Contrast  Result Date: 10/28/2017 CLINICAL DATA:  Right lower quadrant pain since earlier today. Vomiting. History of rhabdo sarcoma and abdominal wall cancer. EXAM: CT ABDOMEN AND PELVIS WITH CONTRAST TECHNIQUE: Multidetector CT imaging of the abdomen and pelvis was performed using the standard protocol following bolus administration of intravenous contrast. CONTRAST:  13mL OMNIPAQUE IOHEXOL 300 MG/ML  SOLN COMPARISON:  09/12/2017 FINDINGS: Lower chest: Unremarkable. Hepatobiliary: Liver is 23.6 cm craniocaudal length, enlarged. The liver shows diffusely decreased attenuation suggesting steatosis. Gallbladder surgically absent. No intrahepatic or extrahepatic biliary dilation. Pancreas: No focal mass lesion. No dilatation of the main duct. No intraparenchymal cyst. No peripancreatic edema. Spleen: Spleen measures 21.3 cm cranial  caudal length, enlarged. Adrenals/Urinary Tract: No adrenal nodule or mass. Kidneys unremarkable. No evidence for hydroureter. The urinary bladder appears normal for the degree of distention. Stomach/Bowel: Stomach is nondistended. No gastric wall thickening. No evidence of outlet obstruction. Duodenum is normally positioned as is the ligament of Treitz. No small bowel wall thickening. No small bowel dilatation. The terminal ileum is normal. The appendix is normal. No gross colonic mass. No colonic wall thickening. No substantial diverticular change. Vascular/Lymphatic: No abdominal aortic aneurysm. There is no gastrohepatic or hepatoduodenal ligament lymphadenopathy. No intraperitoneal or retroperitoneal lymphadenopathy. No pelvic sidewall lymphadenopathy. Reproductive: Uterus unremarkable.  There is no adnexal mass. Other: No intraperitoneal free fluid. Musculoskeletal: No worrisome lytic or sclerotic osseous abnormality. Tiny sclerotic focus in the left iliac bone by the SI joint is stable. IMPRESSION: 1. Stable. No acute findings in the abdomen or pelvis. No evidence to explain the patient's history of right lower quadrant pain. 2. Hepatic steatosis with hepatosplenomegaly. Electronically Signed   By: Misty Stanley M.D.   On: 10/28/2017 19:08    Procedures Procedures (including critical care time)  Medications Ordered in ED Medications  insulin detemir (LEVEMIR) injection 68 Units (has no administration in time range)  sodium chloride 0.9 % bolus 1,000 mL (0 mLs Intravenous Stopped 10/28/17 1907)  sodium chloride 0.9 % bolus 1,000 mL (0 mLs Intravenous Stopped 10/28/17 1813)  morphine 4 MG/ML injection 4 mg (4 mg Intravenous Given 10/28/17 1738)  iohexol (OMNIPAQUE) 300 MG/ML solution 100 mL (100 mLs Intravenous Contrast Given 10/28/17 1823)  Initial Impression / Assessment and Plan / ED Course  I have reviewed the triage vital signs and the nursing notes.  Pertinent labs & imaging results that  were available during my care of the patient were reviewed by me and considered in my medical decision making (see chart for details).     32 year old female with a history of diabetes mellitus type 2, rhabdosarcoma in remission, bipolar 1 disorder, PCOS, and schizophrenia presenting with sudden onset malaise, nausea, vomiting, and right lower quadrant pain earlier today.  Her CBG at home was 560 improving to 488 after taking 40 units of NovoLog.  She has been noncompliant with her home insulin for weeks because " she has been reading about all of the things that can happen when your sugar gets too low or if you overdose from insulin".  We had a lengthy educational discussion on long-term side effects from uncontrolled diabetes.  On exam, the patient did have tenderness over McBurney's point with focal tenderness in the right lower quadrant.  Given uncontrolled diabetes and the length that her symptoms have been ongoing, although she does not have a leukocytosis, will perform a CT of the abdomen and pelvis to ensure she does not have appendicitis.  Labs are notable for mild hyponatremia, CBG of 482, anion gap and bicarb are normal.  The patient was treated with 2 L of normal saline and repeat CBG was 379.  She was also successfully fluid challenged in the ED.  UA with leukocyte esterase, but she has no GU symptoms so we will declined treatment at this time.  Urine culture will be sent.  Although given the fact that she did not have any suprapubic or CVA tenderness bilaterally as well as no urinary complaints, doubt pyelonephritis or cystitis.  Recommended the patient follow-up with her primary care provider this week.  She has been given her home nighttime dose of Levemir in the ED.  The patient was discussed with Dr. Francia Greaves, attending physician.  She is hemodynamically stable and in no acute distress.  She is safe for discharge to home with outpatient follow-up at this time.  Final Clinical  Impressions(s) / ED Diagnoses   Final diagnoses:  Hyperglycemia  History of medication noncompliance    ED Discharge Orders    None       Joanne Gavel, PA-C 10/28/17 2006    Valarie Merino, MD 10/28/17 2205

## 2017-10-28 NOTE — ED Notes (Signed)
Patient transported to CT 

## 2017-10-28 NOTE — ED Notes (Signed)
Pt given cup of water 

## 2017-10-28 NOTE — ED Notes (Signed)
CBG collected. Result "379." RN, [place], notified.

## 2017-10-29 ENCOUNTER — Emergency Department (HOSPITAL_COMMUNITY)
Admission: EM | Admit: 2017-10-29 | Discharge: 2017-10-29 | Disposition: A | Discharge status: Home / Self Care | Payer: Medicare HMO | Attending: Emergency Medicine | Admitting: Emergency Medicine

## 2017-10-29 ENCOUNTER — Encounter (HOSPITAL_COMMUNITY): Payer: Self-pay | Admitting: Emergency Medicine

## 2017-10-29 ENCOUNTER — Ambulatory Visit (HOSPITAL_COMMUNITY)
Admission: EM | Admit: 2017-10-29 | Discharge: 2017-10-29 | Disposition: A | Discharge status: Home / Self Care | Payer: No Typology Code available for payment source | Attending: Emergency Medicine | Admitting: Emergency Medicine

## 2017-10-29 ENCOUNTER — Other Ambulatory Visit: Payer: Self-pay

## 2017-10-29 DIAGNOSIS — Z794 Long term (current) use of insulin: Secondary | ICD-10-CM | POA: Insufficient documentation

## 2017-10-29 DIAGNOSIS — F909 Attention-deficit hyperactivity disorder, unspecified type: Secondary | ICD-10-CM | POA: Insufficient documentation

## 2017-10-29 DIAGNOSIS — Z0441 Encounter for examination and observation following alleged adult rape: Secondary | ICD-10-CM | POA: Insufficient documentation

## 2017-10-29 DIAGNOSIS — Z79899 Other long term (current) drug therapy: Secondary | ICD-10-CM | POA: Insufficient documentation

## 2017-10-29 DIAGNOSIS — T7621XA Adult sexual abuse, suspected, initial encounter: Secondary | ICD-10-CM | POA: Diagnosis not present

## 2017-10-29 DIAGNOSIS — T7421XA Adult sexual abuse, confirmed, initial encounter: Secondary | ICD-10-CM

## 2017-10-29 DIAGNOSIS — E119 Type 2 diabetes mellitus without complications: Secondary | ICD-10-CM | POA: Insufficient documentation

## 2017-10-29 DIAGNOSIS — J45909 Unspecified asthma, uncomplicated: Secondary | ICD-10-CM | POA: Diagnosis not present

## 2017-10-29 DIAGNOSIS — I10 Essential (primary) hypertension: Secondary | ICD-10-CM | POA: Diagnosis not present

## 2017-10-29 LAB — CBG MONITORING, ED
Glucose-Capillary: 451 mg/dL — ABNORMAL HIGH (ref 70–99)
Glucose-Capillary: 181 mg/dL — ABNORMAL HIGH (ref 70–99)

## 2017-10-29 LAB — RAPID HIV SCREEN (HIV 1/2 AB+AG)
HIV 1/2 Antibodies: NONREACTIVE
HIV-1 P24 Antigen - HIV24: NONREACTIVE

## 2017-10-29 LAB — COMPREHENSIVE METABOLIC PANEL
ALK PHOS: 80 U/L (ref 38–126)
ALT: 22 U/L (ref 0–44)
AST: 32 U/L (ref 15–41)
Albumin: 3.6 g/dL (ref 3.5–5.0)
Anion gap: 10 (ref 5–15)
BUN: 5 mg/dL — AB (ref 6–20)
CALCIUM: 8.8 mg/dL — AB (ref 8.9–10.3)
CO2: 22 mmol/L (ref 22–32)
CREATININE: 0.38 mg/dL — AB (ref 0.44–1.00)
Chloride: 104 mmol/L (ref 98–111)
GFR calc Af Amer: >60 mL/min (ref >60)
GFR calc non Af Amer: >60 mL/min (ref >60)
GLUCOSE: 197 mg/dL — AB (ref 70–99)
Potassium: 3.7 mmol/L (ref 3.5–5.1)
SODIUM: 136 mmol/L (ref 135–145)
Total Bilirubin: 0.6 mg/dL (ref 0.3–1.2)
Total Protein: 7 g/dL (ref 6.5–8.1)

## 2017-10-29 LAB — POC URINE PREG, ED: Preg Test, Ur: NEGATIVE

## 2017-10-29 MED ORDER — LIDOCAINE HCL (PF) 1 % IJ SOLN
INTRAMUSCULAR | Status: AC
  Filled 2017-10-29: qty 5

## 2017-10-29 MED ORDER — ELVITEG-COBIC-EMTRICIT-TENOFAF 150-150-200-10 MG PREPACK
5.0000 | ORAL_TABLET | Freq: Once | ORAL | Status: AC
  Administered 2017-10-29: 5 via ORAL
  Filled 2017-10-29: qty 5

## 2017-10-29 MED ORDER — LIDOCAINE HCL (PF) 1 % IJ SOLN
0.9000 mL | Freq: Once | INTRAMUSCULAR | Status: AC
  Administered 2017-10-29: 0.9 mL

## 2017-10-29 MED ORDER — AZITHROMYCIN 250 MG PO TABS
1000.0000 mg | ORAL_TABLET | Freq: Once | ORAL | Status: AC
  Administered 2017-10-29: 1000 mg via ORAL
  Filled 2017-10-29: qty 4

## 2017-10-29 MED ORDER — ULIPRISTAL ACETATE 30 MG PO TABS
30.0000 mg | ORAL_TABLET | Freq: Once | ORAL | Status: AC
  Administered 2017-10-29: 30 mg via ORAL
  Filled 2017-10-29: qty 1

## 2017-10-29 MED ORDER — METRONIDAZOLE 500 MG PO TABS
2000.0000 mg | ORAL_TABLET | Freq: Once | ORAL | Status: AC
  Administered 2017-10-29: 2000 mg via ORAL
  Filled 2017-10-29: qty 4

## 2017-10-29 MED ORDER — ELVITEG-COBIC-EMTRICIT-TENOFAF 150-150-200-10 MG PO TABS: 1.0000 | ORAL_TABLET | Freq: Every day | ORAL | 0 refills | Status: DC

## 2017-10-29 MED ORDER — CEFTRIAXONE SODIUM 250 MG IJ SOLR
250.0000 mg | Freq: Once | INTRAMUSCULAR | Status: AC
  Administered 2017-10-29: 250 mg via INTRAMUSCULAR
  Filled 2017-10-29: qty 250
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
## 2017-10-29 NOTE — Discharge Instructions (Signed)
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 medicines for anxiety or sleep -certain medicines for seizures like carbamazepine, phenobarbital, phenytoin -certain medicines for movement abnormalities as in Parkinson's disease, or for gastrointestinal problems -epinephrine -medicines for allergies or colds -muscle relaxants -narcotic medicines for pain -other medicines that prolong the QT interval (cause an abnormal heart rhythm) -tramadol -trimethobenzamide This list may not describe all possible interactions. Give your health care provider a list of all the medicines, herbs, non-prescription drugs, or dietary supplements you use. Also tell them if you  smoke, drink alcohol, or use illegal drugs. Some items may interact with your medicine. What should I watch for while using this medicine? Tell your doctor or health care professional if your symptoms do not start  to get better in 1 to 2 days. You may get drowsy or dizzy. Do not drive, use machinery, or do anything that needs mental alertness until you know how this medicine affects you. To reduce the risk of dizzy or fainting spells, do not stand or sit up quickly, especially if you are an older patient. Alcohol may increase dizziness and drowsiness. Avoid alcoholic drinks. Your mouth may get dry. Chewing sugarless gum or sucking hard candy, and drinking plenty of water may help. Contact your doctor if the problem does not go away or is severe. This medicine may cause dry eyes and blurred vision. If you wear contact lenses you may feel some discomfort. Lubricating drops may help. See your eye doctor if the problem does not go away or is severe. This medicine can make you more sensitive to the sun. Keep out of the sun. If you cannot avoid being in the sun, wear protective clothing and use sunscreen. Do not use sun lamps or tanning beds/booths. If you are diabetic, check your blood-sugar levels regularly. What side effects may I notice from receiving this medicine? Side effects that you should report to your doctor or health care professional as soon as possible: -blurred vision -irregular heartbeat, palpitations or chest pain -muscle or facial twitches -pain or difficulty passing urine -seizures -skin rash -slowed or shallow breathing -unusual bleeding or bruising -yellowing of the eyes or skin Side effects that usually do not require medical attention (report to your doctor or health care professional if they continue or are bothersome): -headache -nightmares, agitation, nervousness, excitability, not able to sleep (these are more likely in children) -stuffy nose This list may not describe all possible side effects. Call your doctor for medical advice about side effects. You may report side effects to FDA at 1-800-FDA-1088. Where should I keep my medicine? Keep out of the reach of children. Store at  room temperature, between 20 and 25 degrees C (68 and 77 degrees F). Protect from light. Throw away any unused medicine after the expiration date. NOTE: This sheet is a summary. It may not cover all possible information. If you have questions about this medicine, talk to your doctor, pharmacist, or health care provider.  2017 Elsevier/Gold Standard (2012-09-10 15:04:46)   Ceftriaxone (Injection/Shot) Also known as:  Rocephin  Ceftriaxone injection What is this medicine? CEFTRIAXONE (sef try AX one) is a cephalosporin antibiotic. It is used to treat certain kinds of bacterial infections. It will not work for colds, flu, or other viral infections. This medicine may be used for other purposes; ask your health care provider or pharmacist if you have questions. COMMON BRAND NAME(S): Rocephin What should I tell my health care provider before I take this medicine? They need to know if you have any of these conditions: -any chronic illness -bowel disease, like colitis -both kidney and liver disease -high bilirubin level in newborn patients -an unusual or allergic reaction to ceftriaxone, other cephalosporin or penicillin antibiotics, foods, dyes, or preservatives -pregnant or trying to get pregnant -breast-feeding How should I use this medicine? This medicine is injected into a muscle or infused it into a vein. It is usually given in a medical office or clinic. If you are to give this medicine you will be taught how to inject it.  Follow instructions carefully. Use your doses at regular intervals. Do not take your medicine more often than directed. Do not skip doses or stop your medicine early even if you feel better. Do not stop taking except on your doctor's advice. Talk to your pediatrician regarding the use of this medicine in children. Special care may be needed. Overdosage: If you think you have taken too much of this medicine contact a poison control center or emergency room at once. NOTE:  This medicine is only for you. Do not share this medicine with others. What if I miss a dose? If you miss a dose, take it as soon as you can. If it is almost time for your next dose, take only that dose. Do not take double or extra doses. What may interact with this medicine? Do not take this medicine with any of the following medications: -intravenous calcium This medicine may also interact with the following medications: -birth control pills This list may not describe all possible interactions. Give your health care provider a list of all the medicines, herbs, non-prescription drugs, or dietary supplements you use. Also tell them if you smoke, drink alcohol, or use illegal drugs. Some items may interact with your medicine. What should I watch for while using this medicine? Tell your doctor or health care professional if your symptoms do not improve or if they get worse. Do not treat diarrhea with over the counter products. Contact your doctor if you have diarrhea that lasts more than 2 days or if it is severe and watery. If you are being treated for a sexually transmitted disease, avoid sexual contact until you have finished your treatment. Having sex can infect your sexual partner. Calcium may bind to this medicine and cause lung or kidney problems. Avoid calcium products while taking this medicine and for 48 hours after taking the last dose of this medicine. What side effects may I notice from receiving this medicine? Side effects that you should report to your doctor or health care professional as soon as possible: -allergic reactions like skin rash, itching or hives, swelling of the face, lips, or tongue -breathing problems -fever, chills -irregular heartbeat -pain when passing urine -seizures -stomach pain, cramps -unusual bleeding, bruising -unusually weak or tired Side effects that usually do not require medical attention (report to your doctor or health care professional if they  continue or are bothersome): -diarrhea -dizzy, drowsy -headache -nausea, vomiting -pain, swelling, irritation where injected -stomach upset -sweating This list may not describe all possible side effects. Call your doctor for medical advice about side effects. You may report side effects to FDA at 1-800-FDA-1088. Where should I keep my medicine? Keep out of the reach of children. Store at room temperature below 25 degrees C (77 degrees F). Protect from light. Throw away any unused vials after the expiration date. NOTE: This sheet is a summary. It may not cover all possible information. If you have questions about this medicine, talk to your doctor, pharmacist, or health care provider.  2017 Elsevier/Gold Standard (2013-07-28 09:14:54)    Cobicistat; Elvitegravir; Emtricitabine; Tenofovir Alafenamide oral tablets   What is this medicine? COBICISTAT; ELVITEGRAVIR; EMTRICITABINE; TENOFOVIR ALAFENAMIDE (koe BIS i stat; el vye TEG ra veer; em tri SIT uh bean; te NOE fo veer) is three antiretroviral medicines and a medication booster in one tablet. It is used to treat HIV. This medicine is not a cure for HIV. It will not stop the spread of HIV to others. This medicine may be  used for other purposes; ask your health care provider or pharmacist if you have questions. COMMON BRAND NAME(S): Genvoya  What should I tell my health care provider before I take this medicine? They need to know if you have any of these conditions: -kidney disease -liver disease -an unusual or allergic reaction to cobicistat, elvitegravir, emtricitabine, tenofovir, other medicines, foods, dyes, or preservatives -pregnant or trying to get pregnant -breast-feeding  How should I use this medicine? Take this medicine by mouth with a glass of water. Follow the directions on the prescription label. Take this medicine with food. Take your medicine at regular intervals. Do not take your medicine more often than directed. For  your anti-HIV therapy to work as well as possible, take each dose exactly as prescribed. Do not skip doses or stop your medicine even if you feel better. Skipping doses may make the HIV virus resistant to this medicine and other medicines. Do not stop taking except on your doctor's advice. Talk to your pediatrician regarding the use of this medicine in children. While this drug may be prescribed for selected conditions, precautions do apply. Overdosage: If you think you have taken too much of this medicine contact a poison control center or emergency room at once. NOTE: This medicine is only for you. Do not share this medicine with others.  What if I miss a dose? If you miss a dose, take it as soon as you can. If it is almost time for your next dose, take only that dose. Do not take double or extra doses.  What may interact with this medicine? Do not take this medicine with any of the following medications: -adefovir -alfuzosin -certain medicines for seizures like carbamazepine, phenobarbital, phenytoin -cisapride -lumacaftor; ivacaftor -lurasidone -medicines for cholesterol like lovastatin, simvastatin -medicines for headaches like dihydroergotamine, ergotamine, methylergonovine -midazolam -other antiviral medicines for HIV or AIDS -pimozide -rifampin -sildenafil -St. John's wort -triazolam This medicine may also interact with the following medications: -antacids -atorvastatin -bosentan -buprenorphine; naloxone -certain antibiotics like clarithromycin, telithromycin, rifabutin, rifapentine -certain medications for anxiety or sleep like buspirone, clorazepate, diazepam, estazolam, flurazepam, zolpidem -certain medicines for blood pressure or heart disease like amlodipine, diltiazem, felodipine, metoprolol, nicardipine, nifedipine, timolol, verapamil -certain medicines for depression, anxiety, or psychiatric disturbances -certain medicines for erectile dysfunction like avanafil,  sildenafil, tadalafil, vardenafil -certain medicines for fungal infection like itraconazole, ketoconazole, voriconazole -colchicine -cyclosporine -dexamethasone -female hormones, like estrogens and progestins and birth control pills -fluticasone -medicines for infection like acyclovir, cidofovir, valacyclovir, ganciclovir, valganciclovir -medicines for irregular heart beat like amiodarone, bepridil, digoxin, disopyramide, dofetilide, flecainide, lidocaine, mexiletine, propafenone, quinidine -metformin -oxcarbazepine -phenothiazines like perphenazine, risperidone, thioridazine -salmeterol -sirolimus -tacrolimus -warfarin This list may not describe all possible interactions. Give your health care provider a list of all the medicines, herbs, non-prescription drugs, or dietary supplements you use. Also tell them if you smoke, drink alcohol, or use illegal drugs. Some items may interact with your medicine.  What should I watch for while using this medicine? Visit your doctor or health care professional for regular check ups. Discuss any new symptoms with your doctor. You will need to have important blood work done while on this medicine. HIV is spread to others through sexual or blood contact. Talk to your doctor about how to stop the spread of HIV. If you have hepatitis B, talk to your doctor if you plan to stop this medicine. The symptoms of hepatitis B may get worse if you stop this medicine. Birth control pills may not work  properly while you are taking this medicine. Talk to your doctor about using an extra method of birth control. Women who can still have children must use a reliable form of barrier contraception, like a condom.  What side effects may I notice from receiving this medicine? Side effects that you should report to your doctor or health care professional as soon as possible: -allergic reactions like skin rash, itching or hives, swelling of the face, lips, or tongue -breathing  problems -fast, irregular heartbeat -muscle pain or weakness -signs and symptoms of kidney injury like trouble passing urine or change in the amount of urine -signs and symptoms of liver injury like dark yellow or brown urine; general ill feeling or flu-like symptoms; light-colored stools; loss of appetite; right upper belly pain; unusually weak or tired; yellowing of the eyes or skin Side effects that usually do not require medical attention (report to your doctor or health care professional if they continue or are bothersome): -diarrhea -headache -nausea -tiredness This list may not describe all possible side effects. Call your doctor for medical advice about side effects. You may report side effects to FDA at 1-800-FDA-1088.  Where should I keep my medicine? Keep out of the reach of children. Store at room temperature below 30 degrees C (86 degrees F). Throw away any unused medicine after the expiration date. NOTE: This sheet is a summary. It may not cover all possible information. If you have questions about this medicine, talk to your doctor, pharmacist, or health care provider.  2018 Elsevier/Gold Standard (2015-10-25 12:54:04)

## 2017-10-29 NOTE — ED Provider Notes (Signed)
  Provider Note MRN:  [id number]  Arrival date & time: 10/29/17    ED Course and Medical Decision Making  I received sign out for this patient at shift change from Dr. Melina Copa.  Evaluated by forensic nursing, labs drawn, will be given the appropriate prophylactic medications and discharged by the forensic nursing staff.  Barth Kirks. Sedonia Small, [facility] [email or web address]    Maudie Flakes, MD 10/29/17 Curly Rim
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
## 2017-10-29 NOTE — ED Notes (Signed)
SANE RN at bedside.

## 2017-10-29 NOTE — SANE Note (Signed)
Follow-up Phone Call  Patient gives verbal consent for a FNE/SANE follow-up phone call in 48-72 hours: yes Patient's telephone number: [phone] Patient gives verbal consent to leave voicemail at the phone number listed above: yes DO NOT CALL between the hours of: call anytime

## 2017-10-30 LAB — RPR: RPR: NONREACTIVE

## 2017-10-30 LAB — URINE CULTURE

## 2017-10-30 MED FILL — GENVOYA TABLET: 150-150-200 | 30 days supply | Qty: 30 | Fill #0
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
## 2017-10-30 NOTE — SANE Note (Signed)
10/30/2017    0900 Spoke with Burgess Estelle at [facility] and gave a verbal order for Genvoya 30 day supply, one tab daily.  DOB:  1985-06-22 SS#:  [id number] PHONE: [phone] ADDRESS: [place], [place]  [id number] INCOME: [phone] HOUSEHOLD:One person INSURANCE: Medicare [employer] and Medicaid   Daphne reports that pt has been approved and meds will go out by this afternoon.   $3 copay has been waived.

## 2017-10-31 LAB — HEPATITIS B SURFACE ANTIGEN: Hepatitis B Surface Ag: NEGATIVE

## 2017-10-31 LAB — HEPATITIS C ANTIBODY: HCV Ab: <0.1 s/co ratio (ref 0.0–0.9)

## 2017-11-13 NOTE — SANE Note (Signed)
FNE called number patient provided.  Patient unavailable, but patient's sister-in-law, Tina Saunders states that patient has been staying with her mother and will be moving to [place] soon.  Patient has not been back to her previous residence.

## 2017-11-28 ENCOUNTER — Emergency Department (HOSPITAL_COMMUNITY)
Admission: EM | Admit: 2017-11-28 | Discharge: 2017-11-29 | Disposition: A | Discharge status: Other Acute Inpatient Hospital | Payer: Medicare HMO | Attending: Emergency Medicine | Admitting: Emergency Medicine

## 2017-11-28 ENCOUNTER — Other Ambulatory Visit: Payer: Self-pay

## 2017-11-28 ENCOUNTER — Encounter (HOSPITAL_COMMUNITY): Payer: Self-pay | Admitting: Emergency Medicine

## 2017-11-28 DIAGNOSIS — Z79899 Other long term (current) drug therapy: Secondary | ICD-10-CM | POA: Diagnosis not present

## 2017-11-28 DIAGNOSIS — I10 Essential (primary) hypertension: Secondary | ICD-10-CM | POA: Insufficient documentation

## 2017-11-28 DIAGNOSIS — R45851 Suicidal ideations: Secondary | ICD-10-CM | POA: Diagnosis not present

## 2017-11-28 DIAGNOSIS — E1110 Type 2 diabetes mellitus with ketoacidosis without coma: Secondary | ICD-10-CM | POA: Insufficient documentation

## 2017-11-28 DIAGNOSIS — Z794 Long term (current) use of insulin: Secondary | ICD-10-CM | POA: Diagnosis not present

## 2017-11-28 DIAGNOSIS — J45909 Unspecified asthma, uncomplicated: Secondary | ICD-10-CM | POA: Diagnosis not present

## 2017-11-28 DIAGNOSIS — F251 Schizoaffective disorder, depressive type: Secondary | ICD-10-CM | POA: Diagnosis not present

## 2017-11-28 DIAGNOSIS — F29 Unspecified psychosis not due to a substance or known physiological condition: Secondary | ICD-10-CM | POA: Diagnosis present

## 2017-11-28 LAB — RAPID URINE DRUG SCREEN, HOSP PERFORMED
Amphetamines: NOT DETECTED
Barbiturates: NOT DETECTED
Benzodiazepines: NOT DETECTED
Cocaine: NOT DETECTED
Opiates: NOT DETECTED
Tetrahydrocannabinol: NOT DETECTED

## 2017-11-28 LAB — I-STAT BETA HCG BLOOD, ED (MC, WL, AP ONLY): I-stat hCG, quantitative: <5 m[IU]/mL (ref <5)
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
## 2017-11-28 NOTE — ED Notes (Signed)
Staffing notified for sitter
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
## 2017-11-29 ENCOUNTER — Encounter (HOSPITAL_COMMUNITY): Payer: Self-pay | Admitting: Registered Nurse

## 2017-11-29 ENCOUNTER — Inpatient Hospital Stay (HOSPITAL_COMMUNITY)
Admission: AD | Admit: 2017-11-29 | Discharge: 2017-12-06 | DRG: 882 | Disposition: A | Discharge status: Home / Self Care | Payer: Medicare HMO | Source: Intra-hospital | Attending: Emergency Medicine | Admitting: Emergency Medicine

## 2017-11-29 ENCOUNTER — Encounter (HOSPITAL_COMMUNITY): Payer: Self-pay

## 2017-11-29 ENCOUNTER — Other Ambulatory Visit: Payer: Self-pay

## 2017-11-29 DIAGNOSIS — R45851 Suicidal ideations: Secondary | ICD-10-CM | POA: Diagnosis not present

## 2017-11-29 DIAGNOSIS — J45909 Unspecified asthma, uncomplicated: Secondary | ICD-10-CM | POA: Diagnosis not present

## 2017-11-29 DIAGNOSIS — G4700 Insomnia, unspecified: Secondary | ICD-10-CM | POA: Diagnosis not present

## 2017-11-29 DIAGNOSIS — F603 Borderline personality disorder: Secondary | ICD-10-CM | POA: Diagnosis present

## 2017-11-29 DIAGNOSIS — R000 Tachycardia, unspecified: Secondary | ICD-10-CM | POA: Diagnosis not present

## 2017-11-29 DIAGNOSIS — Z8249 Family history of ischemic heart disease and other diseases of the circulatory system: Secondary | ICD-10-CM

## 2017-11-29 DIAGNOSIS — G40909 Epilepsy, unspecified, not intractable, without status epilepticus: Secondary | ICD-10-CM | POA: Diagnosis present

## 2017-11-29 DIAGNOSIS — Z794 Long term (current) use of insulin: Secondary | ICD-10-CM | POA: Diagnosis not present

## 2017-11-29 DIAGNOSIS — Z915 Personal history of self-harm: Secondary | ICD-10-CM

## 2017-11-29 DIAGNOSIS — F4311 Post-traumatic stress disorder, acute: Principal | ICD-10-CM | POA: Diagnosis present

## 2017-11-29 DIAGNOSIS — Z833 Family history of diabetes mellitus: Secondary | ICD-10-CM | POA: Diagnosis not present

## 2017-11-29 DIAGNOSIS — E669 Obesity, unspecified: Secondary | ICD-10-CM | POA: Diagnosis not present

## 2017-11-29 DIAGNOSIS — Z6834 Body mass index (BMI) 34.0-34.9, adult: Secondary | ICD-10-CM

## 2017-11-29 DIAGNOSIS — E119 Type 2 diabetes mellitus without complications: Secondary | ICD-10-CM | POA: Diagnosis not present

## 2017-11-29 DIAGNOSIS — F251 Schizoaffective disorder, depressive type: Secondary | ICD-10-CM

## 2017-11-29 DIAGNOSIS — N390 Urinary tract infection, site not specified: Secondary | ICD-10-CM | POA: Diagnosis not present

## 2017-11-29 DIAGNOSIS — E1169 Type 2 diabetes mellitus with other specified complication: Secondary | ICD-10-CM

## 2017-11-29 DIAGNOSIS — R569 Unspecified convulsions: Secondary | ICD-10-CM

## 2017-11-29 DIAGNOSIS — E1165 Type 2 diabetes mellitus with hyperglycemia: Secondary | ICD-10-CM | POA: Diagnosis present

## 2017-11-29 DIAGNOSIS — F445 Conversion disorder with seizures or convulsions: Secondary | ICD-10-CM | POA: Diagnosis not present

## 2017-11-29 DIAGNOSIS — Z888 Allergy status to other drugs, medicaments and biological substances status: Secondary | ICD-10-CM

## 2017-11-29 DIAGNOSIS — E1110 Type 2 diabetes mellitus with ketoacidosis without coma: Secondary | ICD-10-CM | POA: Diagnosis not present

## 2017-11-29 DIAGNOSIS — I10 Essential (primary) hypertension: Secondary | ICD-10-CM | POA: Diagnosis present

## 2017-11-29 DIAGNOSIS — E1142 Type 2 diabetes mellitus with diabetic polyneuropathy: Secondary | ICD-10-CM

## 2017-11-29 DIAGNOSIS — F4310 Post-traumatic stress disorder, unspecified: Secondary | ICD-10-CM

## 2017-11-29 DIAGNOSIS — F332 Major depressive disorder, recurrent severe without psychotic features: Secondary | ICD-10-CM | POA: Diagnosis not present

## 2017-11-29 DIAGNOSIS — Z79899 Other long term (current) drug therapy: Secondary | ICD-10-CM | POA: Diagnosis not present

## 2017-11-29 LAB — COMPREHENSIVE METABOLIC PANEL
ALT: 25 U/L (ref 0–44)
AST: 34 U/L (ref 15–41)
Albumin: 3.7 g/dL (ref 3.5–5.0)
Alkaline Phosphatase: 96 U/L (ref 38–126)
Anion gap: 12 (ref 5–15)
BUN: 9 mg/dL (ref 6–20)
CO2: 20 mmol/L — ABNORMAL LOW (ref 22–32)
Calcium: 8.7 mg/dL — ABNORMAL LOW (ref 8.9–10.3)
Chloride: 99 mmol/L (ref 98–111)
Creatinine, Ser: 0.58 mg/dL (ref 0.44–1.00)
GFR calc Af Amer: >60 mL/min (ref >60)
GFR calc non Af Amer: >60 mL/min (ref >60)
Glucose, Bld: 356 mg/dL — ABNORMAL HIGH (ref 70–99)
Potassium: 3.6 mmol/L (ref 3.5–5.1)
Sodium: 131 mmol/L — ABNORMAL LOW (ref 135–145)
Total Bilirubin: 0.6 mg/dL (ref 0.3–1.2)
Total Protein: 6.7 g/dL (ref 6.5–8.1)

## 2017-11-29 LAB — CBC
HCT: 37.2 % (ref 36.0–46.0)
Hemoglobin: 12 g/dL (ref 12.0–15.0)
MCH: 25.9 pg — ABNORMAL LOW (ref 26.0–34.0)
MCHC: 32.3 g/dL (ref 30.0–36.0)
MCV: 80.3 fL (ref 80.0–100.0)
Platelets: 116 10*3/uL — ABNORMAL LOW (ref 150–400)
RBC: 4.63 MIL/uL (ref 3.87–5.11)
RDW: 15.7 % — ABNORMAL HIGH (ref 11.5–15.5)
WBC: 4.7 10*3/uL (ref 4.0–10.5)
nRBC: 0 % (ref 0.0–0.2)

## 2017-11-29 LAB — CBG MONITORING, ED: Glucose-Capillary: 299 mg/dL — ABNORMAL HIGH (ref 70–99)

## 2017-11-29 LAB — ETHANOL: Alcohol, Ethyl (B): <10 mg/dL (ref <10)

## 2017-11-29 LAB — ACETAMINOPHEN LEVEL: Acetaminophen (Tylenol), Serum: <10 ug/mL — ABNORMAL LOW (ref 10–30)

## 2017-11-29 LAB — GLUCOSE, CAPILLARY: GLUCOSE-CAPILLARY: 213 mg/dL — AB (ref 70–99)

## 2017-11-29 LAB — SALICYLATE LEVEL: Salicylate Lvl: <7 mg/dL (ref 2.8–30.0)

## 2017-11-29 MED ORDER — INSULIN ASPART 100 UNIT/ML ~~LOC~~ SOLN
0.0000 [IU] | Freq: Three times a day (TID) | SUBCUTANEOUS | Status: DC
  Administered 2017-11-29: 3 [IU] via SUBCUTANEOUS
  Administered 2017-11-30 (×2): 2 [IU] via SUBCUTANEOUS
  Administered 2017-11-30: 1 [IU] via SUBCUTANEOUS
  Administered 2017-12-01 (×2): 3 [IU] via SUBCUTANEOUS
  Administered 2017-12-01: 1 [IU] via SUBCUTANEOUS
  Administered 2017-12-02: 2 [IU] via SUBCUTANEOUS
  Administered 2017-12-02: 5 [IU] via SUBCUTANEOUS
  Administered 2017-12-02: 7 [IU] via SUBCUTANEOUS
  Administered 2017-12-03: 5 [IU] via SUBCUTANEOUS
  Administered 2017-12-03 – 2017-12-04 (×3): 3 [IU] via SUBCUTANEOUS
  Administered 2017-12-04: 7 [IU] via SUBCUTANEOUS
  Administered 2017-12-04: 3 [IU] via SUBCUTANEOUS
  Administered 2017-12-05: 5 [IU] via SUBCUTANEOUS
  Administered 2017-12-05: 7 [IU] via SUBCUTANEOUS
  Administered 2017-12-05: 2 [IU] via SUBCUTANEOUS
  Administered 2017-12-06 (×2): 5 [IU] via SUBCUTANEOUS
  Filled 2017-11-29: qty 1

## 2017-11-29 MED ORDER — METOPROLOL TARTRATE 25 MG PO TABS
25.0000 mg | ORAL_TABLET | Freq: Two times a day (BID) | ORAL | Status: DC
  Administered 2017-11-29: 25 mg via ORAL
  Filled 2017-11-29: qty 1

## 2017-11-29 MED ORDER — QUETIAPINE FUMARATE 200 MG PO TABS
500.0000 mg | ORAL_TABLET | Freq: Every day | ORAL | Status: DC
  Administered 2017-11-29 – 2017-12-05 (×7): 500 mg via ORAL
  Filled 2017-11-29 (×10): qty 2.5

## 2017-11-29 MED ORDER — INSULIN DETEMIR 100 UNIT/ML ~~LOC~~ SOLN
75.0000 [IU] | Freq: Two times a day (BID) | SUBCUTANEOUS | Status: DC
  Filled 2017-11-29: qty 0.75

## 2017-11-29 MED ORDER — MELATONIN 3 MG PO TABS
3.0000 mg | ORAL_TABLET | Freq: Every day | ORAL | Status: DC
  Administered 2017-11-29 – 2017-12-05 (×7): 3 mg via ORAL
  Filled 2017-11-29 (×10): qty 1

## 2017-11-29 MED ORDER — ATORVASTATIN CALCIUM 40 MG PO TABS: 40.0000 mg | ORAL_TABLET | Freq: Every day | ORAL | Status: DC

## 2017-11-29 MED ORDER — INSULIN ASPART 100 UNIT/ML ~~LOC~~ SOLN
32.0000 [IU] | Freq: Three times a day (TID) | SUBCUTANEOUS | Status: DC
  Administered 2017-11-29: 32 [IU] via SUBCUTANEOUS

## 2017-11-29 MED ORDER — BUSPIRONE HCL 10 MG PO TABS
15.0000 mg | ORAL_TABLET | Freq: Three times a day (TID) | ORAL | Status: DC
  Administered 2017-11-29: 15 mg via ORAL
  Filled 2017-11-29: qty 2

## 2017-11-29 MED ORDER — LORAZEPAM 1 MG PO TABS
1.0000 mg | ORAL_TABLET | ORAL | Status: AC | PRN
  Administered 2017-12-01: 1 mg via ORAL
  Filled 2017-11-29: qty 1

## 2017-11-29 MED ORDER — "INSULIN SYRINGE-NEEDLE U-100 25G X 1"" 1 ML MISC": 1.0000 | Freq: Three times a day (TID) | Status: DC

## 2017-11-29 MED ORDER — CHLORPROMAZINE HCL 50 MG PO TABS
50.0000 mg | ORAL_TABLET | Freq: Three times a day (TID) | ORAL | Status: DC
  Administered 2017-11-29 – 2017-12-06 (×21): 50 mg via ORAL
  Filled 2017-11-29: qty 2
  Filled 2017-11-29 (×29): qty 1

## 2017-11-29 MED ORDER — QUETIAPINE FUMARATE 50 MG PO TABS
100.0000 mg | ORAL_TABLET | Freq: Every day | ORAL | Status: DC
  Administered 2017-11-29: 100 mg via ORAL
  Filled 2017-11-29: qty 2

## 2017-11-29 MED ORDER — LEVETIRACETAM 500 MG PO TABS
500.0000 mg | ORAL_TABLET | Freq: Two times a day (BID) | ORAL | Status: DC
  Administered 2017-11-29 – 2017-12-06 (×14): 500 mg via ORAL
  Filled 2017-11-29 (×22): qty 1

## 2017-11-29 MED ORDER — ONDANSETRON 4 MG PO TBDP: 4.0000 mg | ORAL_TABLET | Freq: Three times a day (TID) | ORAL | Status: DC | PRN

## 2017-11-29 MED ORDER — GABAPENTIN 400 MG PO CAPS
400.0000 mg | ORAL_CAPSULE | Freq: Three times a day (TID) | ORAL | Status: DC
  Administered 2017-11-29: 400 mg via ORAL
  Filled 2017-11-29: qty 1

## 2017-11-29 MED ORDER — MAGNESIUM OXIDE 400 (241.3 MG) MG PO TABS
400.0000 mg | ORAL_TABLET | Freq: Every day | ORAL | Status: DC
  Administered 2017-11-29: 400 mg via ORAL
  Filled 2017-11-29: qty 1

## 2017-11-29 MED ORDER — OLANZAPINE 10 MG PO TBDP
10.0000 mg | ORAL_TABLET | Freq: Three times a day (TID) | ORAL | Status: DC | PRN
  Administered 2017-12-03: 10 mg via ORAL
  Filled 2017-11-29: qty 1

## 2017-11-29 MED ORDER — INSULIN ASPART 100 UNIT/ML ~~LOC~~ SOLN
32.0000 [IU] | Freq: Three times a day (TID) | SUBCUTANEOUS | Status: DC
  Administered 2017-11-29: 32 [IU] via SUBCUTANEOUS
  Filled 2017-11-29: qty 1

## 2017-11-29 MED ORDER — GABAPENTIN 400 MG PO CAPS
400.0000 mg | ORAL_CAPSULE | Freq: Three times a day (TID) | ORAL | Status: DC
  Administered 2017-11-29 – 2017-12-02 (×8): 400 mg via ORAL
  Filled 2017-11-29 (×16): qty 1

## 2017-11-29 MED ORDER — ATORVASTATIN CALCIUM 40 MG PO TABS
40.0000 mg | ORAL_TABLET | Freq: Every day | ORAL | Status: DC
  Administered 2017-11-29 – 2017-12-05 (×7): 40 mg via ORAL
  Filled 2017-11-29 (×11): qty 1

## 2017-11-29 MED ORDER — INSULIN ASPART 100 UNIT/ML ~~LOC~~ SOLN: 0.0000 [IU] | Freq: Three times a day (TID) | SUBCUTANEOUS | Status: DC

## 2017-11-29 MED ORDER — SERTRALINE HCL 100 MG PO TABS
200.0000 mg | ORAL_TABLET | Freq: Every day | ORAL | Status: DC
  Administered 2017-11-30 – 2017-12-06 (×7): 200 mg via ORAL
  Filled 2017-11-29 (×11): qty 2

## 2017-11-29 MED ORDER — LEVETIRACETAM 500 MG PO TABS
500.0000 mg | ORAL_TABLET | Freq: Two times a day (BID) | ORAL | Status: DC
  Administered 2017-11-29: 500 mg via ORAL
  Filled 2017-11-29: qty 1

## 2017-11-29 MED ORDER — MELATONIN 3 MG PO TABS: 3.0000 mg | ORAL_TABLET | Freq: Every day | ORAL | Status: DC

## 2017-11-29 MED ORDER — INSULIN DETEMIR 100 UNIT/ML ~~LOC~~ SOLN
75.0000 [IU] | Freq: Two times a day (BID) | SUBCUTANEOUS | Status: DC
  Administered 2017-11-29 – 2017-11-30 (×2): 75 [IU] via SUBCUTANEOUS

## 2017-11-29 MED ORDER — CHLORPROMAZINE HCL 25 MG PO TABS
50.0000 mg | ORAL_TABLET | Freq: Three times a day (TID) | ORAL | Status: DC
  Administered 2017-11-29: 50 mg via ORAL
  Filled 2017-11-29: qty 2

## 2017-11-29 MED ORDER — LISINOPRIL 10 MG PO TABS
10.0000 mg | ORAL_TABLET | Freq: Every day | ORAL | Status: DC
  Administered 2017-11-30 – 2017-12-04 (×4): 10 mg via ORAL
  Filled 2017-11-29 (×9): qty 1

## 2017-11-29 MED ORDER — BUSPIRONE HCL 15 MG PO TABS
15.0000 mg | ORAL_TABLET | Freq: Three times a day (TID) | ORAL | Status: DC
  Administered 2017-11-29 – 2017-12-06 (×21): 15 mg via ORAL
  Filled 2017-11-29 (×29): qty 1
  Filled 2017-11-29: qty 2

## 2017-11-29 MED ORDER — METOPROLOL TARTRATE 25 MG PO TABS
25.0000 mg | ORAL_TABLET | Freq: Two times a day (BID) | ORAL | Status: DC
  Administered 2017-11-30 – 2017-12-02 (×5): 25 mg via ORAL
  Filled 2017-11-29 (×8): qty 1

## 2017-11-29 MED ORDER — QUETIAPINE FUMARATE 50 MG PO TABS: 500.0000 mg | ORAL_TABLET | Freq: Every day | ORAL | Status: DC

## 2017-11-29 MED ORDER — TOPIRAMATE 100 MG PO TABS
100.0000 mg | ORAL_TABLET | Freq: Every day | ORAL | Status: DC
  Administered 2017-11-29 – 2017-12-05 (×7): 100 mg via ORAL
  Filled 2017-11-29 (×10): qty 1

## 2017-11-29 MED ORDER — LISINOPRIL 10 MG PO TABS
10.0000 mg | ORAL_TABLET | Freq: Every day | ORAL | Status: DC
  Administered 2017-11-29: 10 mg via ORAL
  Filled 2017-11-29: qty 1

## 2017-11-29 MED ORDER — QUETIAPINE FUMARATE 100 MG PO TABS
100.0000 mg | ORAL_TABLET | Freq: Every day | ORAL | Status: DC
  Administered 2017-11-29 – 2017-12-06 (×9): 100 mg via ORAL
  Filled 2017-11-29 (×12): qty 1

## 2017-11-29 MED ORDER — MAGNESIUM OXIDE 400 (241.3 MG) MG PO TABS
400.0000 mg | ORAL_TABLET | Freq: Every day | ORAL | Status: DC
  Administered 2017-11-29 – 2017-12-06 (×8): 400 mg via ORAL
  Filled 2017-11-29 (×11): qty 1
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
## 2017-11-29 NOTE — BHH Group Notes (Signed)
[facility] Group Notes:  (Nursing/MHT/Case Management/Adjunct)  Date:  11/29/2017  Time:  4:44 PM  Type of Therapy:  Psychoeducational Skills  Participation Level:  Did Not Attend    Coralyn Mark Wrigley Plasencia 11/29/2017, 4:44 PM

## 2017-11-29 NOTE — ED Notes (Signed)
[facility] transportation called for transport

## 2017-11-29 NOTE — ED Notes (Signed)
Report given to Wheeler, Therapist, sports at [facility]. Pt signed voluntary admit form.
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
## 2017-11-29 NOTE — Progress Notes (Signed)
Beckie Busing, Nurse informed of pt disposition.
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
## 2017-11-29 NOTE — Progress Notes (Signed)
Pt accepted to [facility], Bed 402-2  Shuvon Rankin, NP is the accepting provider.  Dr. Myles Lipps, MD is the attending provider.  Call report to [facility] ED notified.   Pt is Voluntary.  Pt may be transported by [facility] Patient may be transported to arrive as soon as transport can be arranged.  Areatha Keas. Judi Cong, MSW, [facility] Clinical Social Work [phone] (cell) [phone] (office)

## 2017-11-29 NOTE — ED Notes (Signed)
Carb modified breakfest tray ordered

## 2017-11-30 LAB — GLUCOSE, CAPILLARY
GLUCOSE-CAPILLARY: 192 mg/dL — AB (ref 70–99)
GLUCOSE-CAPILLARY: 177 mg/dL — AB (ref 70–99)
Glucose-Capillary: 128 mg/dL — ABNORMAL HIGH (ref 70–99)

## 2017-11-30 MED ORDER — PRAZOSIN HCL 1 MG PO CAPS
1.0000 mg | ORAL_CAPSULE | Freq: Every day | ORAL | Status: DC
  Administered 2017-11-30 – 2017-12-05 (×6): 1 mg via ORAL
  Filled 2017-11-30 (×9): qty 1

## 2017-11-30 MED ORDER — INSULIN DETEMIR 100 UNIT/ML ~~LOC~~ SOLN
65.0000 [IU] | Freq: Two times a day (BID) | SUBCUTANEOUS | Status: DC
  Administered 2017-11-30 – 2017-12-03 (×6): 65 [IU] via SUBCUTANEOUS

## 2017-11-30 MED ORDER — INSULIN ASPART 100 UNIT/ML ~~LOC~~ SOLN
25.0000 [IU] | Freq: Three times a day (TID) | SUBCUTANEOUS | Status: DC
  Administered 2017-11-30 – 2017-12-02 (×6): 25 [IU] via SUBCUTANEOUS
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
## 2017-11-30 NOTE — Progress Notes (Signed)
Nursing Progress Note: 7p-7a D: Pt currently presents with a blunted/empty/flat affect and behavior. Not Interacting with the milieu. Pt reports fair sleep during the previous night with current medication regimen. Pt did not attend wrap-up group.  A: Pt provided with medications per providers orders. Pt's labs and vitals were monitored throughout the night. Pt supported emotionally and encouraged to express concerns and questions. Pt educated on medications.  R: Pt's safety ensured with 15 minute and environmental checks. Pt currently denies SI, HI, and AVH. Pt verbally contracts to seek staff if SI,HI, or AVH occurs and to consult with staff before acting on any harmful thoughts. Will continue to monitor.

## 2017-11-30 NOTE — [facility] Group Notes (Signed)
[facility] Group Notes:  (Nursing/MHT/Case Management/Adjunct)  Date:  11/30/2017  Time:  4:15 pm  Type of Therapy:  Psychoeducational Group  Participation Level:  Did Not Attend  Participation Quality:    Affect:    Cognitive:    Insight:    Engagement in Group:    Modes of Intervention:    Summary of Progress/Problems:  Cammy Copa 11/30/2017, 7:52 PM

## 2017-11-30 NOTE — Progress Notes (Signed)
D: Patient was upset this morning because she is unit restricted.  Explained to patient that MD wants her unit restricted at this time.  Patient has stayed back from meals times; she is eating her meals in her room.  She has been appropriate and cooperative.  Her insulin was decreased this morning, due to lower CBGs.  She continues to present with flat, blunted affect and depressed mood.  She denies any thoughts of self harm.  Her goal today is to "work on my depression."  She was complaining of constipation, however, that has been resolved per patient report.  She is visible in the milieu.  A: Continue to monitor medication management and MD orders.  Safety checks completed every 15 minutes per protocol.  Offer support and encouragement as needed.  R: Patient is receptive to staff; her behavior is appropriate.

## 2017-11-30 NOTE — Progress Notes (Signed)
CSW attempted to contact the patient's [facility], Aneta Mins [place] [phone]) to discuss the patient's possible disposition and discharge planning.   There was no answer and CSW left a voicemail requesting a call back.   CSW will continue to follow.    Radonna Ricker, MSW, [place] Worker [facility]  Phone: [phone]

## 2017-11-30 NOTE — [facility] Group Notes (Signed)
Pt was invited but did not attend wrap up group.

## 2017-11-30 NOTE — Progress Notes (Signed)
Recreation Therapy Notes  Date: 11.8.19 Time: 0930 Location: [address] Dayroom  Group Topic: Stress Management  Goal Area(s) Addresses:  Patient will verbalize importance of using healthy stress management.  Patient will identify positive emotions associated with healthy stress management.   Intervention: Stress Management  Activity :  Meditation.  LRT introduced the stress management technique of meditation.  LRT played a meditation that allowed patients to embody the stillness of a mountain.  Patients were to listen and follow along as the meditation played.  Education:  Stress Management, Discharge Planning.   Education Outcome: Acknowledges edcuation/In group clarification offered/Needs additional education  Clinical Observations/Feedback: Pt did not attend group.     Victorino Sparrow, LRT/CTRS         Victorino Sparrow A 11/30/2017 11:27 AM
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
## 2017-12-01 LAB — GLUCOSE, CAPILLARY
GLUCOSE-CAPILLARY: 233 mg/dL — AB (ref 70–99)
Glucose-Capillary: 155 mg/dL — ABNORMAL HIGH (ref 70–99)
Glucose-Capillary: 141 mg/dL — ABNORMAL HIGH (ref 70–99)

## 2017-12-01 MED ORDER — SUMATRIPTAN SUCCINATE 50 MG PO TABS
50.0000 mg | ORAL_TABLET | ORAL | Status: DC | PRN
  Filled 2017-12-01: qty 1

## 2017-12-01 MED ORDER — LORAZEPAM 1 MG PO TABS
1.0000 mg | ORAL_TABLET | Freq: Once | ORAL | Status: AC
  Administered 2017-12-01: 1 mg via ORAL
  Filled 2017-12-01: qty 1

## 2017-12-01 NOTE — Plan of Care (Signed)
  Problem: Coping: Goal: Coping ability will improve 12/01/2017 1700 by Sharma Covert, RN Outcome: Progressing 12/01/2017 1659 by Sharma Covert, RN Outcome: Progressing

## 2017-12-01 NOTE — Progress Notes (Signed)
D Pt is observed OOB UAL on the 400 hall today tolerated well. She spokle with her physician and was toaken off the Unit Restriction , due to her request as well as her commitment to Dr Mallie Darting that she will keep her blood sugar under control ( no vending machine foods, no soft drinks) . She is excited about going to her meals in hte cafe' with the rest of her peers!    A She completed her daily assessment and on this she wrote she denied SI today and she rated her depression, hopelessness and anxeity " 2/3/3/", respectively.     R Pty cbg at 1200 233. CBG at 1700 141. Pos reinforcement offered to pt.Safety in place.
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
## 2017-12-01 NOTE — [facility] Group Notes (Signed)
LCSW Group Therapy Note  12/01/2017    11:00-11:45am   Type of Therapy and Topic:  Group Therapy: Anger and Coping Skills  Participation Level:  None   Description of Group:   In this group, patients learned how to recognize the physical, cognitive, emotional, and behavioral responses they have to anger-provoking situations.  They identified how they usually or often react when angered, and learned how healthy and unhealthy coping skills work initially, but the unhealthy ones stop working.   They analyzed how their frequently-chosen coping skill is possibly beneficial and how it is possibly unhelpful.  The group discussed a variety of healthier coping skills that could help in resolving the actual issues, as well as how to go about planning for the the possibility of future similar situations.  Therapeutic Goals: 1. Patients will identify one thing that makes them angry and how they feel emotionally and physically, what their thoughts are or tend to be in those situations, and what healthy or unhealthy coping mechanism they typically use 2. Patients will identify how their coping technique works for them, as well as how it works against them. 3. Patients will explore possible new behaviors to use in future anger situations. 4. Patients will learn that anger itself is normal and cannot be eliminated, and that healthier coping skills can assist with resolving conflict rather than worsening situations.  Summary of Patient Progress:  The patient was not present until the last few minutes of group, did not talk at all during group but was attentive.  Therapeutic Modalities:   Cognitive Behavioral Therapy Motivation Interviewing  Tina Saunders  .

## 2017-12-01 NOTE — Progress Notes (Signed)
Adult Psychoeducational Group Note  Date:  12/01/2017 Time:  8:45am-10:15am   Group Topic/Focus:  Goals Group:   The focus of this group is to help patients establish daily goals to achieve during treatment and discuss how the patient can incorporate goal setting into their daily lives to aide in recovery.  Participation Level:  Active  Participation Quality:  Appropriate and Attentive  Affect:  Appropriate  Cognitive:  Alert, Appropriate and Oriented  Insight: Appropriate and Good  Engagement in Group:  Engaged and Supportive  Modes of Intervention:  Discussion  Additional Comments:  Patient informed the group that she was unsure about what her goal for the day was going to be. Mental health tech helped patient to reflect on some of the things she was struggling with. Patient informed group that she felt like she was not useful. Mental health tech helped the patient to reflect on whether or not her goal for the day could be to identify 3 ways that she has value. Patient was receptive of this as her goal.    Doyle Askew 12/01/2017, 2:52 PM

## 2017-12-01 NOTE — Progress Notes (Signed)
Nursing Progress Note: 7p-7a D: Pt currently presents with a thought blocking/childlike/flat/empty affect and behavior. Pt states "I don't know how to talk to people. Can you talk to them for me?" Interacting minimally with the milieu. Pt reports fair sleep during the previous night with current medication regimen. Pt did not attend wrap-up group.  A: Pt provided with medications per providers orders. Pt's labs and vitals were monitored throughout the night. Pt supported emotionally and encouraged to express concerns and questions. Pt educated on medications.  R: Pt's safety ensured with 15 minute and environmental checks. Pt currently denies SI, HI, and AVH. Pt verbally contracts to seek staff if SI,HI, or AVH occurs and to consult with staff before acting on any harmful thoughts. Will continue to monitor.

## 2017-12-01 NOTE — Plan of Care (Signed)
  Problem: Coping: Goal: Coping ability will improve Outcome: Progressing   

## 2017-12-01 NOTE — [facility] Group Notes (Signed)
[place] Group Notes:  (Nursing/MHT/Case Management/Adjunct)  Date:  12/01/2017  Time:  6:33 PM  Type of Therapy:  Nurse Education  Participation Level:  Did Not Attend  Summary of Progress/Problems: Patient's discussed love languages and healthy communication. How to develop a positive self-image and self-esteem was also covered. The patient did not attend.  Lesli Albee 12/01/2017, 6:33 PM
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
## 2017-12-02 LAB — GLUCOSE, CAPILLARY
GLUCOSE-CAPILLARY: 175 mg/dL — AB (ref 70–99)
GLUCOSE-CAPILLARY: 308 mg/dL — AB (ref 70–99)
Glucose-Capillary: 266 mg/dL — ABNORMAL HIGH (ref 70–99)
Glucose-Capillary: 266 mg/dL — ABNORMAL HIGH (ref 70–99)

## 2017-12-02 MED ORDER — INSULIN ASPART 100 UNIT/ML ~~LOC~~ SOLN
30.0000 [IU] | Freq: Three times a day (TID) | SUBCUTANEOUS | Status: DC
  Administered 2017-12-02 – 2017-12-06 (×13): 30 [IU] via SUBCUTANEOUS
  Filled 2017-12-02: qty 1

## 2017-12-02 MED ORDER — GABAPENTIN 600 MG PO TABS
600.0000 mg | ORAL_TABLET | Freq: Three times a day (TID) | ORAL | Status: DC
  Administered 2017-12-02 – 2017-12-04 (×7): 600 mg via ORAL
  Filled 2017-12-02 (×12): qty 1

## 2017-12-02 MED ORDER — METOPROLOL TARTRATE 12.5 MG HALF TABLET
12.5000 mg | ORAL_TABLET | Freq: Two times a day (BID) | ORAL | Status: DC
  Administered 2017-12-02: 12.5 mg via ORAL
  Filled 2017-12-02 (×4): qty 1

## 2017-12-02 NOTE — Progress Notes (Signed)
Patient ID: Tina Saunders, female   DOB: 1985/11/09, 32 y.o.   MRN: [id number] D: Patient in room resting on approach. Pt reports feeling tired but is doing well.  Pt reports she is tolerating medication well. Pt mood and affect appeared depressed and flat. Pt denies SI/HI/AVH and pain. Cooperative with assessment. No acute distressed noted at this time.  A: Medications administered as prescribed. Support and encouragement provided. Pt educated and encourage to make better food choice relating to diabetics. Pt encouraged to discuss feelings and come to staff with any question or concerns.  R: Patient remains safe and complaint with medications.

## 2017-12-02 NOTE — Plan of Care (Signed)
  Problem: Coping: Goal: Coping ability will improve Outcome: Progressing   

## 2017-12-02 NOTE — Progress Notes (Signed)
Psychoeducational Group Note  Date:  12/02/2017 Time:  2112  Group Topic/Focus:  Wrap-Up Group:   The focus of this group is to help patients review their daily goal of treatment and discuss progress on daily workbooks.  Participation Level: Did Not Attend  Participation Quality:  Not Applicable  Affect:  Not Applicable  Cognitive:  Not Applicable  Insight:  Not Applicable  Engagement in Group: Not Applicable  Additional Comments:  The patient did not attend group this evening since she was asleep in her bedroom.   Archie Balboa S 12/02/2017, 9:12 PM

## 2017-12-02 NOTE — Plan of Care (Signed)
  Problem: Medication: Goal: Compliance with prescribed medication regimen will improve Outcome: Progressing   Problem: Education: Goal: Knowledge of [facility] Education information/materials will improve Outcome: Progressing    Patient oriented to the unit. Patient took medications as prescribed. Patient remains safe and will continue to monitor.

## 2017-12-02 NOTE — [facility] Group Notes (Signed)
[place] [facility] LCSW Group Therapy Note  12/02/2017  10:00-11:00AM  Type of Therapy and Topic:  Group Therapy:  Adding Supports Including Being Your Own Support  Participation Level:  Active   Description of Group:  Patients in this group were introduced to the concept that additional supports including self-support are an essential part of recovery.  A song entitled "I Need Help!" was played and a group discussion was held in reaction to the idea of needing to add supports.  A song entitled "My Own Hero" was played and a group discussion ensued in which patients stated they could relate to the song and it inspired them to realize they have be willing to help themselves in order to succeed, because other people cannot achieve sobriety or stability for them.  We discussed adding a variety of healthy supports to address the various needs in their lives.  A song was played called "I Know Where I've Been" toward the end of group and used to conduct an inspirational wrap-up to group of remembering how far they have already come in their journey.  Therapeutic Goals: 1)  demonstrate the importance of being a part of one's own support system 2)  discuss reasons people in one's life may eventually be unable to be continually supportive  3)  identify the patient's current support system and   4)  elicit commitments to add healthy supports and to become more conscious of being self-supportive   Summary of Patient Progress:  The patient expressed that her brother and sister-in-law are healthy supports for her while she has come to understand that her mother is a very unhealthy support for her.   Therapeutic Modalities:   Motivational Interviewing Activity  Maretta Los

## 2017-12-02 NOTE — Progress Notes (Signed)
D: Patient is alert and cooperative. Patient presents with blank facial expression and blunted affect. Patient denies SI, HI, AVH, and verbally contracts for safety. Patient rates her anxiety 5/10 on 2100 assessment and requested PRN anxiety medication.  At 2130 patient reported anxiety 7/10 and left side squeezing chest pain 7/10 that traveled down left arm. Patient did not appear in distress.    A: Patient notified that PRN anxiety medication was discontinued during the day. Order obtained for one time dose and one time dose administered per NP order. Vitals rechecked and were witin normal limits. EKG completed and given to NP. NP assessed patient.  Scheduled medications administered per MD order. Support provided. Patient educated on safety on the unit and medications. Routine safety checks every 15 minutes. Patient stated understanding to tell nurse about any new physical symptoms. Patient understands to tell staff of any needs.     R: Patient reported anxiety reduced to 5/10 after medication administration. Patient ensured all tests indicated normal parameters. Patient encouraged to rest. No adverse drug reactions noted. Patient verbally contracts for safety. Patient remains safe at this time and will continue to monitor.
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
## 2017-12-02 NOTE — Progress Notes (Addendum)
D Pt is observed OOB UAL on the 400 hall today she tolerates this well. Initially, she wanted to sleep late and not get up, but she got up, took her am meds and stayed out of her bed the entire shift. Her affect remains flat, blunted and depressed. She demonstrates poor processing ability and has a mile delay in her reaction / response time. She is appropraitely dressed. SHe sits in the dayroom, attends her groups and is actively talking to this writer all shift about " doing good so the doctor will let me go home".     A SHe completed her daily assessment and on this she wrote she denied SI today and she rated her depression, hopelessness and anxeity " 5/4/6", respectively. She spoke with Probation officer at lunch and at dinner, discussed her cbg and was offered encouragement and positive feedback to continued to be compliant with diabetic diet. She assured Probation officer she is not eating food from vending machine in the cafe'.     R Safety is in place and poc includes possible  dc tomorrow. EKG performed 1900 per MD order QTc = 470 and writer notified Dr. Mallie Darting. Received vo to dc metoprolol and this was ordered.

## 2017-12-03 LAB — GLUCOSE, CAPILLARY
GLUCOSE-CAPILLARY: 219 mg/dL — AB (ref 70–99)
GLUCOSE-CAPILLARY: 318 mg/dL — AB (ref 70–99)
Glucose-Capillary: 211 mg/dL — ABNORMAL HIGH (ref 70–99)
Glucose-Capillary: 257 mg/dL — ABNORMAL HIGH (ref 70–99)
Glucose-Capillary: 395 mg/dL — ABNORMAL HIGH (ref 70–99)

## 2017-12-03 MED ORDER — INSULIN DETEMIR 100 UNIT/ML ~~LOC~~ SOLN
70.0000 [IU] | Freq: Two times a day (BID) | SUBCUTANEOUS | Status: DC
  Administered 2017-12-03 – 2017-12-06 (×6): 70 [IU] via SUBCUTANEOUS
  Filled 2017-12-03 (×2): qty 0.7

## 2017-12-03 NOTE — Progress Notes (Signed)
CSW attempted to call [facility] care coordinator Regan Carthens on both available numbers.  No answer. Winferd Humphrey, MSW, LCSW Clinical Social Worker 12/03/2017 3:05 PM

## 2017-12-03 NOTE — [facility] Group Notes (Signed)
Pt was invited but did not attend group facilitated by MHT AJ (Topic-Wellness).

## 2017-12-03 NOTE — Progress Notes (Signed)
EKG completed and placed on MD desk for review.  

## 2017-12-03 NOTE — Progress Notes (Signed)
D:  Patient's self inventory sheet, patient sleeps good, no sleep medication.  Good appetite, normal energy level, good concentration.  Rated depression and hopeless 3, anxiety 2.  Denied withdrawals.  Denied SI.  Denied physical problems.  Denied physical pain.  Goal is work on Radiographer, therapeutic.  Plans to talk to MD.  No discharge plans. A:  Medications administered per MD orders.  Emotional support and encouragement given patient. R:  Denied SI and HI, contracts for safety.  Denied A/V hallucinations.  Safety maintained with 15 minute checks.

## 2017-12-03 NOTE — [facility] Group Notes (Signed)
Adult Psychoeducational Group Note  Date:  12/03/2017 Time:  9:30 AM  Group Topic/Focus:  Orientation:   The focus of this group is to educate the patient on the purpose and policies of crisis stabilization and provide a format to answer questions about their admission.  The group details unit policies and expectations of patients while admitted.  Participation Level:  Active  Participation Quality:  Appropriate  Affect:  Appropriate  Cognitive:  Alert  Insight: Appropriate  Engagement in Group:  Engaged  Modes of Intervention:  Orientation  Additional Comments:   Pt attended orientation group facilitated by MHT AJ   Huel Cote 12/03/2017, 9:30 AM

## 2017-12-03 NOTE — Discharge Instructions (Addendum)
Your work-up today showed reassuring labs.  Her vital signs improved after fluids.  I suspect dehydration contribute to your breakthrough seizure.  We also discussed changes in sleep patterns while being admitted and the caffeine use may have also contributed to your seizure.  Your seizure was typical to prior with the preceding sensory changes.  We have low suspicion for other abnormality's.  Please follow-up with your neurologist for further management and continue your seizure medications at home and while admitted.  If any symptoms change or worsen, please return to the nearest emergency department.  Diabetes Label Reading Tips Check the Nutrition Facts on food labels for nutrient information for the food.   The Nutrition Facts information is based on a standard serving size.  However, that serving size may not be the same as the serving size used in carbohydrate counting.  Always start by checking the serving size on the label. Is this the serving size you will be eating?  How many servings are in the package? Next, look at the total carbohydrate. It is measured in grams (g).  To ?nd the number of carbohydrate servings in 1 standard serving of a food, divide the total grams of carbohydrate by 15. One (1) carbohydrate serving is the amount of food with 15 g carbohydrate.  You dont need to count grams of sugars. They are included in the total carbohydrate.  The label shows how many calories are in the standard serving. It also lists the amount of fat, cholesterol, sodium, protein, and some vitamins and minerals.  Look below the line listing total fat to ?nd out how much of that fat is saturated fat or trans fat. Choose foods that are low in these kinds of fats because they are not healthy for your heart. In the foods that are healthiest for your heart, grams of saturated fat and trans fat are less than one-third of the total fat grams. If foods are very low in calories (less than 20 calories per  serving) or carbohydrates (5 g carbohydrate or less per serving), you may not need to count them when you count carbohydrates.    Carbohydrate Counting for People with Diabetes  Why Is Carbohydrate Counting Important? Counting carbohydrate servings may help you control your blood glucose level so that you feel better.  The balance between the carbohydrates you eat and insulin determines what your blood glucose level will be after eating. Carbohydrate counting can also help you plan your meals.   Which Foods Have Carbohydrates?  Foods with carbohydrates include: breads, crackers, and cereals, pasta, rice, and grains, starchy vegetables such as potatoes, corn, and peas,beans and legumes, milk, soy milk, and yogurt, fruits and fruit juices, and sweets such as cakes, cookies, ice cream, jam, and jelly.  Carbohydrate Servings In diabetes meal planning, 1 serving of a food with carbohydrate has about 15 grams of carbohydrate: Check serving sizes with measuring cups and spoons or a food scale. Read the Nutrition Facts on food labels to ?nd out how many grams of carbohydrate are in foods you eat. The food lists in this handout show portions that have about 15 grams of carbohydrate.   Tips for Meal Planning and an Eating Plan For many adults, eating 3 to 5 servings of carbohydrate foods at each meal and 1 or 2 carbohydrate servings for each snack works well. In a healthy daily Eating Plan, most carbohydrates come from: at least 6 servings of fruits and nonstarchy vegetables, at least 6 servings of grains,  beans, and starchy vegetables, with at least 3 servings from whole grains, at least 2 servings of milk or milk products.  Check your blood glucose level regularly. It can tell you if you need to adjust when you eat carbohydrates. Eating foods that have ?ber, such as whole grains, and having very few salty foods is good for your health. Eat 4 to 6 ounces of meat or other protein foods (such as soybean burgers)  each day. Choose low-fat sources of protein such as lean beef, lean pork, chicken, ?sh, low-fat cheese, or vegetarian foods such as soy . Eat some healthy fats, such as olive oil, canola oil, and nuts. Eat very little saturated fats. These unhealthy fats are found in butter, cream, and high-fat meats, such as bacon and sausage. Eat very little or no trans fats. These unhealthy fats are found in all foods that list partially hydrogenated oil as an ingredient.  Label Reading Tips The Nutrition Facts panel on a label lists the grams of total carbohydrate in 1 standard serving. The labels standard serving may be larger or smaller than 1 carbohydrate serving. To ?gure out how many carbohydrate servings are in the food: First, look at the labels standard serving size. Check the grams of total carbohydrate. This is the amount of carbohydrate in 1 standard serving.  Divide the grams of total carbohydrate by 15. This number equals the number of carbohydrate servings in 1 standard serving. Remember: 1 carbohydrate serving is 15 grams of carbohydrate. Note: You may ignore the grams of sugars on the Nutrition Facts panel because they are included in the grams of total carbohydrate.    Foods Recommended 1 serving = about 15 grams of carbohydrate  Starches: 1 slice bread (1 ounce), 1 tortilla (6-inch size),  large bagel (1 ounce), 2 taco shells (5-inch size),  hamburger or hot dog bun ( ounce),  cup ready-to-eat unsweetened cereal,  cup cooked cereal, 1 cup broth-based soup, 4 to 6 small crackers, 1/3 cup pasta or rice (cooked),  cup beans, peas, corn, sweet potatoes, winter squash, or mashed or boiled potatoes (cooked),  large baked potato (3 ounces),  ounce pretzels, potato chips, or tortilla chips, 3 cups popcorn (popped)  Fruit: 1 small fresh fruit ( to 1 cup),  cup canned or frozen fruit, 2 tablespoons dried fruit (blueberries, cherries, cranberries, mixed fruit, raisins), 17 small grapes (3 ounces), 1  cup melon or berries,  cup unsweetened fruit juice  Milk: 1 cup fat-free or reduced-fat milk, 1 cup soy milk, 2/3 cup (6 ounces) nonfat yogurt sweetened with sugar-free sweetener  Sweets and Desserts: 2-inch square cake (unfrosted), 2 small cookies (2/3 ounce),  cup ice cream or frozen yogurt,  cup sherbet or sorbet, 1 tablespoon syrup, jam, jelly, table sugar, or honey, 2 tablespoons light syrup  Other Foods: count 1 cup raw vegetables or  cup cooked nonstarchy vegetables as zero (0) carbohydrate servings or free foods. If you eat 3 or more servings at one meal, count them as 1 carbohydrate serving. Foods that have less than 20 calories in each serving also may be counted as zero carbohydrate servings or free foods. Count 1 cup of casserole or other mixed foods as 2 carbohydrate servings.

## 2017-12-03 NOTE — Progress Notes (Signed)
Adult Psychoeducational Group Note  Date:  12/03/2017 Time:  9:14 PM  Group Topic/Focus:  Wrap-Up Group:   The focus of this group is to help patients review their daily goal of treatment and discuss progress on daily workbooks.  Participation Level:  Active  Participation Quality:  Appropriate  Affect:  Appropriate  Cognitive:  Appropriate  Insight: Appropriate  Engagement in Group:  Engaged  Modes of Intervention:  Discussion  Additional Comments:  The  Nash Shearer 12/03/2017, 9:14 PM  Patient expressed that she rates today a 6.The patient also said that her goal is to attended groups.
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
## 2017-12-03 NOTE — Progress Notes (Signed)
NUTRITION NOTE  RD consulted for nutrition education regarding diabetes.   Lab Results  Component Value Date   HGBA1C 12.4 (H) 07/26/2017   RD unable to talk with patient in-person today. Unsure of when patient may d/c so will place handouts/information in Discharge Instructions so that patient may have access to them for home use as well.  Portion control with carbohydrate foods will be important, as well as regularly checking CBGs and having an understanding of how to correct a low CBG and what to do when CBG is high.   It is also important that patient consume protein each time she eats. Limiting or avoiding dessert items, sugary beverages, and items such as pretzels can also be beneficial for glycemic control as well as overall health and wellness.  Expect fair compliance based on review of notes in the chart.  Body mass index is 34.67 kg/m. Pt meets criteria for obesity based on current BMI.  Current diet order is Regular. Will change to Carb Modified to aid in learning while admitted. Patient is eating as desired for meals and snacks at this time.   Labs and medications reviewed. No further nutrition interventions warranted at this time. RD contact information provided. If additional nutrition issues arise, please re-consult RD.    Jarome Matin, MS, RD, LDN, [facility] Inpatient Clinical Dietitian Pager # [phone] After hours/weekend pager # [phone]

## 2017-12-03 NOTE — Plan of Care (Signed)
Nurse discussed anxiety, depression and coping skills with patient.  

## 2017-12-03 NOTE — Progress Notes (Signed)
Recreation Therapy Notes  Date: 11.11.19 Time: 0930 Location: 300 [address] Dayroom  Group Topic: Stress Management  Goal Area(s) Addresses:  Patient will verbalize importance of using healthy stress management.  Patient will identify positive emotions associated with healthy stress management.   Intervention: Stress Management  Activity :  Progressive Muscle Relaxation.  LRT introduced the stress management technique of progressive muscle relaxation.  LRT read a script to lead patients through a series of exercises that allowed them to tense and then relax each muscle group individually.    Education:  Stress Management, Discharge Planning.   Education Outcome: Acknowledges edcuation/In group clarification offered/Needs additional education  Clinical Observations/Feedback: Pt did not attend group.    Victorino Sparrow, LRT/CTRS         Ria Comment, Taira Knabe A 12/03/2017 11:06 AM

## 2017-12-04 DIAGNOSIS — E119 Type 2 diabetes mellitus without complications: Secondary | ICD-10-CM

## 2017-12-04 DIAGNOSIS — G4700 Insomnia, unspecified: Secondary | ICD-10-CM

## 2017-12-04 DIAGNOSIS — I10 Essential (primary) hypertension: Secondary | ICD-10-CM

## 2017-12-04 LAB — CBC WITH DIFFERENTIAL/PLATELET
ABS IMMATURE GRANULOCYTES: 0.01 10*3/uL (ref 0.00–0.07)
BASOS PCT: 0 %
Basophils Absolute: 0 10*3/uL (ref 0.0–0.1)
EOS ABS: 0.2 10*3/uL (ref 0.0–0.5)
Eosinophils Relative: 3 %
HCT: 38.1 % (ref 36.0–46.0)
Hemoglobin: 12 g/dL (ref 12.0–15.0)
IMMATURE GRANULOCYTES: 0 %
Lymphocytes Relative: 31 %
Lymphs Abs: 1.4 10*3/uL (ref 0.7–4.0)
MCH: 25.9 pg — ABNORMAL LOW (ref 26.0–34.0)
MCHC: 31.5 g/dL (ref 30.0–36.0)
MCV: 82.1 fL (ref 80.0–100.0)
Monocytes Absolute: 0.2 10*3/uL (ref 0.1–1.0)
Monocytes Relative: 5 %
NEUTROS ABS: 2.7 10*3/uL (ref 1.7–7.7)
NEUTROS PCT: 61 %
NRBC: 0 % (ref 0.0–0.2)
PLATELETS: 98 10*3/uL — AB (ref 150–400)
RBC: 4.64 MIL/uL (ref 3.87–5.11)
RDW: 15.9 % — AB (ref 11.5–15.5)
WBC: 4.5 10*3/uL (ref 4.0–10.5)

## 2017-12-04 LAB — GLUCOSE, CAPILLARY
Glucose-Capillary: 241 mg/dL — ABNORMAL HIGH (ref 70–99)
Glucose-Capillary: 255 mg/dL — ABNORMAL HIGH (ref 70–99)
Glucose-Capillary: 308 mg/dL — ABNORMAL HIGH (ref 70–99)

## 2017-12-04 LAB — URINALYSIS, ROUTINE W REFLEX MICROSCOPIC
Bilirubin Urine: NEGATIVE
Ketones, ur: NEGATIVE mg/dL
Nitrite: NEGATIVE
PH: 5 (ref 5.0–8.0)
Protein, ur: NEGATIVE mg/dL
SPECIFIC GRAVITY, URINE: 1.016 (ref 1.005–1.030)
WBC, UA: >50 WBC/hpf — ABNORMAL HIGH (ref 0–5)

## 2017-12-04 LAB — COMPREHENSIVE METABOLIC PANEL
ALT: 27 U/L (ref 0–44)
AST: 36 U/L (ref 15–41)
Albumin: 3.8 g/dL (ref 3.5–5.0)
Alkaline Phosphatase: 115 U/L (ref 38–126)
Anion gap: 10 (ref 5–15)
BUN: 9 mg/dL (ref 6–20)
CHLORIDE: 107 mmol/L (ref 98–111)
CO2: 19 mmol/L — AB (ref 22–32)
CREATININE: 0.51 mg/dL (ref 0.44–1.00)
Calcium: 8.8 mg/dL — ABNORMAL LOW (ref 8.9–10.3)
GFR calc Af Amer: >60 mL/min (ref >60)
GFR calc non Af Amer: >60 mL/min (ref >60)
Glucose, Bld: 347 mg/dL — ABNORMAL HIGH (ref 70–99)
POTASSIUM: 4 mmol/L (ref 3.5–5.1)
SODIUM: 136 mmol/L (ref 135–145)
Total Bilirubin: 0.3 mg/dL (ref 0.3–1.2)
Total Protein: 7.2 g/dL (ref 6.5–8.1)

## 2017-12-04 LAB — CBG MONITORING, ED
GLUCOSE-CAPILLARY: 308 mg/dL — AB (ref 70–99)
Glucose-Capillary: 333 mg/dL — ABNORMAL HIGH (ref 70–99)

## 2017-12-04 MED ORDER — GABAPENTIN 300 MG PO CAPS
600.0000 mg | ORAL_CAPSULE | Freq: Three times a day (TID) | ORAL | Status: DC
  Administered 2017-12-04 – 2017-12-06 (×5): 600 mg via ORAL
  Filled 2017-12-04 (×10): qty 2

## 2017-12-04 MED ORDER — SODIUM CHLORIDE 0.9 % IV BOLUS
1000.0000 mL | Freq: Once | INTRAVENOUS | Status: AC
  Administered 2017-12-04: 1000 mL via INTRAVENOUS

## 2017-12-04 MED ORDER — SULFAMETHOXAZOLE-TRIMETHOPRIM 400-80 MG PO TABS
1.0000 | ORAL_TABLET | Freq: Two times a day (BID) | ORAL | Status: DC
  Administered 2017-12-04 – 2017-12-06 (×5): 1 via ORAL
  Filled 2017-12-04 (×11): qty 1

## 2017-12-04 NOTE — Progress Notes (Signed)
Pt's CBG at bedtime was 395.  Provider was notified of pt's high CBG.  Pt has just eaten a snack.  CBG was rechecked in an hour and was down to 318.  Will continue to monitor as needed.

## 2017-12-04 NOTE — ED Notes (Signed)
Pt sent from [facility] for evaluation after falling out of bed. She is unsure if she had a seizure, but her roommate said that she did. Hx of seizures. Does not appear post-ictal, she endorses some soreness on the left side of her head where she hit the floor. No AMS.

## 2017-12-04 NOTE — ED Notes (Signed)
Delaying insulin until lab results come back per MD.

## 2017-12-04 NOTE — [facility] Group Notes (Signed)
Pt did not attend wrap up group this evening. Pt was over at the ED.

## 2017-12-04 NOTE — Progress Notes (Signed)
Pt reports that she is having symptoms of a UTI.  Urine specimen obtained and order received from the evening provider.  Pt observed for most of the evening in the dayroom.  She stays to herself with minimal interaction with other patients.  Pt reports her day was ok.  Writer reviewed her evening meds with her before bedtime.  She is concerned that the Melatonin is not enough medication for sleep.  She says she takes Trazodone at home.  Pt was encouraged to speak to the doctor tomorrow.  She also stated she was anxious and needed other medication for her anxiety.  She had already been given her scheduled meds.  She was given the prn Zyprexa as ordered.  Support and encouragement offered.  Discharge plans are in process.  Safety maintained with q15 minute checks.

## 2017-12-04 NOTE — [facility] Group Notes (Signed)
LCSW Group Therapy Note 12/04/2017 12:50 PM  Type of Therapy and Topic: Group Therapy: Overcoming Obstacles  Participation Level: Did Not Attend  Description of Group:  In this group patients will be encouraged to explore what they see as obstacles to their own wellness and recovery. They will be guided to discuss their thoughts, feelings, and behaviors related to these obstacles. The group will process together ways to cope with barriers, with attention given to specific choices patients can make. Each patient will be challenged to identify changes they are motivated to make in order to overcome their obstacles. This group will be process-oriented, with patients participating in exploration of their own experiences as well as giving and receiving support and challenge from other group members.  Therapeutic Goals: 1. Patient will identify personal and current obstacles as they relate to admission. 2. Patient will identify barriers that currently interfere with their wellness or overcoming obstacles.  3. Patient will identify feelings, thought process and behaviors related to these barriers. 4. Patient will identify two changes they are willing to make to overcome these obstacles:   Summary of Patient Progress  Invited, chose not to attend.    Therapeutic Modalities:  Cognitive Behavioral Therapy Solution Focused Therapy Motivational Interviewing Relapse Prevention Therapy   Theresa Duty Clinical Social Worker
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
## 2017-12-04 NOTE — ED Notes (Signed)
EKG and CBG delayed.  RN at bedside.

## 2017-12-04 NOTE — ED Notes (Signed)
Bed: WLPT1 Expected date:  Expected time:  Means of arrival:  Comments: 

## 2017-12-04 NOTE — ED Provider Notes (Signed)
[facility] DEPT Provider Note   CSN: [id number] Arrival date & time: 12/04/17  1512     History   Chief Complaint Chief Complaint  Patient presents with  . Seizures    HPI Tina Saunders is a 32 y.o. female.  The history is provided by the patient and medical records. No language interpreter was used.  Seizures   This is a recurrent problem. The current episode started less than 1 hour ago. The problem has been resolved. There was 1 seizure. The most recent episode lasted less than 30 seconds. Pertinent negatives include no confusion, no headaches, no speech difficulty, no visual disturbance, no neck stiffness, no sore throat, no chest pain, no cough, no nausea, no vomiting and no muscle weakness. The episode was witnessed. The seizures did not continue in the ED. The seizure(s) had no focality. Possible causes include sleep deprivation. Possible causes do not include change in alcohol use. There were no medications administered prior to arrival.    Past Medical History:  Diagnosis Date  . Anxiety   . Asthma   . Bipolar 1 disorder ([place])   . Cancer of abdominal wall   . Depression   . Diabetes mellitus without complication ([place])   . High cholesterol   . Hypertension   . Intentional drug overdose ([place]) 07/26/2017   Seroquel and buspirone   . Obesity   . Obesity   . Polycystic ovarian syndrome 07/01/2011   Patient report  . 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 Schizophrenia ([place])   . Seizures [facility])     Patient Active Problem List   Diagnosis Date Noted  . 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 Adjustment disorder with mixed disturbance of emotions and conduct   . QT prolongation   . Suicide attempt ([place])   . Overdose of antidepressant, intentional self-harm, initial encounter ([place])   . Overdose 07/26/2017  . Suicide attempt by  other psychotropic drug overdose ([place])   . DKA (diabetic ketoacidoses) ([place]) 03/14/2017  . Chronic constipation   . Seizures ([place]) 12/11/2016  . Diabetes mellitus type 2 in obese ([place]) 12/11/2016  . Uncontrolled diabetes mellitus ([place]) 11/03/2016  . Schizoaffective disorder, bipolar type ([place]) 11/02/2016  . Cluster B personality disorder ([place]) 11/02/2016  . Suicidal ideation   . 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 HTN (hypertension) 04/15/2013  . Posttraumatic stress disorder 12/07/2011  . PSVT (paroxysmal supraventricular tachycardia) ([place]) 08/26/2011  . ADHD 09/23/2007  . EPIGASTRIC PAIN 09/23/2007  . Obesity, unspecified 07/30/2007  . Schizoaffective disorder, depressive type ([place]) 07/30/2007  . SLEEP DISORDER 07/30/2007  . METRORRHAGIA 06/13/2006  . DISORDER, MENSTRUAL NEC 06/13/2006  . POLYCYSTIC OVARIAN DISEASE 04/25/2006  . AMENORRHEA, SECONDARY 04/20/2006  . ACNE, MILD 04/20/2006    Past Surgical History:  Procedure Laterality Date  . CHOLECYSTECTOMY    . COLONOSCOPY WITH PROPOFOL N/A 03/08/2017   Procedure: COLONOSCOPY WITH PROPOFOL;  Surgeon: Milus Banister, MD;  Location: [facility] ENDOSCOPY;  Service: Endoscopy;  Laterality: N/A;  . HERNIA REPAIR    . Ovarian Cyst Excision    . VARICOSE VEIN SURGERY       OB History    Gravida  0   Para      Term      Preterm      AB      Living        SAB  TAB      Ectopic      Multiple      Live Births               Home Medications    Prior to Admission medications   Medication Sig Start Date End Date Taking? Authorizing Provider  atorvastatin (LIPITOR) 40 MG tablet Take 1 tablet (40 mg total) by mouth at bedtime. 07/30/17   Daisy Floro, DO  busPIRone (BUSPAR) 15 MG tablet Take 1 tablet (15 mg total) by mouth 3 (three) times daily. 07/30/17   Daisy Floro, DO  chlorproMAZINE (THORAZINE) 50 MG tablet Take 1 tablet (50 mg total) by mouth 3 (three) times daily. For agitation/mood control  02/09/17   Lindell Spar I, NP  elvitegravir-cobicistat-emtricitabine-tenofovir (GENVOYA) 150-150-200-10 MG TABS tablet Take 1 tablet by mouth daily with breakfast. Patient not taking: Reported on 11/29/2017 10/29/17   Maudie Flakes, MD  gabapentin (NEURONTIN) 400 MG capsule Take 1 capsule (400 mg total) by mouth 3 (three) times daily. For agitation/diabetic neuropathy 02/09/17   Lindell Spar I, NP  insulin aspart (NOVOLOG) 100 UNIT/ML injection Inject 6 Units into the skin 3 (three) times daily with meals. For diabetes management Patient taking differently: Inject 32 Units into the skin 3 (three) times daily with meals. For diabetes management 07/17/17   Montine Circle, PA-C  insulin detemir (LEVEMIR) 100 UNIT/ML injection Inject 0.68 mLs (68 Units total) into the skin 2 (two) times daily. For diabetes management Patient taking differently: Inject 75 Units into the skin 2 (two) times daily. For diabetes management 07/17/17   Langston Masker B, PA-C  Insulin Syringe-Needle U-100 25G X 1" 1 ML MISC 1 Syringe by Does not apply route 4 (four) times daily -  before meals and at bedtime. 07/17/17   Langston Masker B, PA-C  levETIRAcetam (KEPPRA) 500 MG tablet Take 1 tablet (500 mg total) by mouth 2 (two) times daily. For mood stabilization 02/09/17   Lindell Spar I, NP  lisinopril (PRINIVIL,ZESTRIL) 10 MG tablet Take 1 tablet (10 mg total) by mouth daily. For high blood pressure 02/09/17   Lindell Spar I, NP  Magnesium 500 MG TABS Take 1 tablet (500 mg total) by mouth daily. 07/30/17   Daisy Floro, DO  Melatonin 5 MG TABS Take 5 mg by mouth at bedtime.    [provider]  metoprolol tartrate (LOPRESSOR) 25 MG tablet Take 1 tablet (25 mg total) by mouth 2 (two) times daily. 07/30/17   Daisy Floro, DO  ondansetron (ZOFRAN ODT) 4 MG disintegrating tablet Take 1 tablet (4 mg total) by mouth every 8 (eight) hours as needed for nausea or vomiting. 09/12/17   Street, [place], PA-C  QUEtiapine  (SEROQUEL) 100 MG tablet Take 100 mg by mouth daily.    [provider]  QUEtiapine (SEROQUEL) 200 MG tablet Take 500 mg by mouth at bedtime.    [provider]  sertraline (ZOLOFT) 100 MG tablet Take 100 mg by mouth daily.     [provider]  topiramate (TOPAMAX) 100 MG tablet Take 100 mg by mouth daily.     [provider]  traZODone (DESYREL) 50 MG tablet Take 50 mg by mouth at bedtime.    [provider]    Family History Family History  Problem Relation Age of Onset  . Coronary artery disease Maternal Grandmother   . Diabetes type II Maternal Grandmother   . Cancer Maternal Grandmother   . Hypertension Mother   .  Hypertension Father     Social History Social History   Tobacco Use  . Smoking status: Never Smoker  . Smokeless tobacco: Never Used  Substance Use Topics  . Alcohol use: No  . Drug use: No     Allergies   Fish-derived products; Geodon [ziprasidone hcl]; Haldol [haloperidol lactate]; Buprenorphine hcl; Compazine [prochlorperazine]; Morphine and related; and Toradol [ketorolac tromethamine]   Review of Systems Review of Systems  Constitutional: Negative for chills, diaphoresis, fatigue and fever.  HENT: Negative for congestion and sore throat.   Eyes: Negative for visual disturbance.  Respiratory: Negative for cough, choking, chest tightness, shortness of breath and wheezing.   Cardiovascular: Negative for chest pain, palpitations and leg swelling.  Gastrointestinal: Negative for abdominal pain, nausea and vomiting.  Genitourinary: Negative for flank pain and frequency.  Musculoskeletal: Negative for back pain, neck pain and neck stiffness.  Skin: Negative for rash and wound.  Neurological: Positive for seizures. Negative for syncope, speech difficulty, weakness and headaches.  Psychiatric/Behavioral: Negative for agitation and confusion.  All other systems reviewed and are negative.    Physical Exam Updated  Vital Signs BP 115/78 (BP Location: Left Arm)   Pulse (!) 120   Temp 97.9 F (36.6 C) (Oral)   Resp 16   Ht 5\' 4"  (1.626 m)   Wt 91.6 kg   SpO2 98%   BMI 34.67 kg/m   Physical Exam  Constitutional: She is oriented to person, place, and time. She appears well-developed and well-nourished. No distress.  HENT:  Head: Normocephalic and atraumatic.  Nose: Nose normal.  Mouth/Throat: Oropharynx is clear and moist. No oropharyngeal exudate.  Eyes: Pupils are equal, round, and reactive to light. Conjunctivae are normal.  Neck: Normal range of motion.  Cardiovascular: Normal rate and intact distal pulses.  No murmur heard. Pulmonary/Chest: Effort normal. No respiratory distress. She has no wheezes. She has no rales. She exhibits no tenderness.  Abdominal: Soft. She exhibits no distension. There is no tenderness.  Musculoskeletal: She exhibits no tenderness.  Neurological: She is alert and oriented to person, place, and time. No cranial nerve deficit or sensory deficit. She exhibits normal muscle tone. Coordination normal.  Skin: Capillary refill takes less than 2 seconds. No rash noted. She is not diaphoretic. No erythema.  Psychiatric: She has a normal mood and affect.  Nursing note and vitals reviewed.    ED Treatments / Results  Labs (all labs ordered are listed, but only abnormal results are displayed) Labs Reviewed  GLUCOSE, CAPILLARY - Abnormal; Notable for the following components:      Result Value   Glucose-Capillary 213 (*)    All other components within normal limits  GLUCOSE, CAPILLARY - Abnormal; Notable for the following components:   Glucose-Capillary 128 (*)    All other components within normal limits  GLUCOSE, CAPILLARY - Abnormal; Notable for the following components:   Glucose-Capillary 192 (*)    All other components within normal limits  GLUCOSE, CAPILLARY - Abnormal; Notable for the following components:   Glucose-Capillary 177 (*)    All other components  within normal limits  GLUCOSE, CAPILLARY - Abnormal; Notable for the following components:   Glucose-Capillary 155 (*)    All other components within normal limits  GLUCOSE, CAPILLARY - Abnormal; Notable for the following components:   Glucose-Capillary 233 (*)    All other components within normal limits  GLUCOSE, CAPILLARY - Abnormal; Notable for the following components:   Glucose-Capillary 141 (*)    All other components  within normal limits  GLUCOSE, CAPILLARY - Abnormal; Notable for the following components:   Glucose-Capillary 175 (*)    All other components within normal limits  GLUCOSE, CAPILLARY - Abnormal; Notable for the following components:   Glucose-Capillary 308 (*)    All other components within normal limits  GLUCOSE, CAPILLARY - Abnormal; Notable for the following components:   Glucose-Capillary 266 (*)    All other components within normal limits  GLUCOSE, CAPILLARY - Abnormal; Notable for the following components:   Glucose-Capillary 266 (*)    All other components within normal limits  GLUCOSE, CAPILLARY - Abnormal; Notable for the following components:   Glucose-Capillary 211 (*)    All other components within normal limits  GLUCOSE, CAPILLARY - Abnormal; Notable for the following components:   Glucose-Capillary 219 (*)    All other components within normal limits  GLUCOSE, CAPILLARY - Abnormal; Notable for the following components:   Glucose-Capillary 257 (*)    All other components within normal limits  URINALYSIS, ROUTINE W REFLEX MICROSCOPIC - Abnormal; Notable for the following components:   APPearance HAZY (*)    Glucose, UA >=500 (*)    Hgb urine dipstick MODERATE (*)    Leukocytes, UA LARGE (*)    WBC, UA >50 (*)    Bacteria, UA RARE (*)    All other components within normal limits  GLUCOSE, CAPILLARY - Abnormal; Notable for the following components:   Glucose-Capillary 395 (*)    All other components within normal limits  GLUCOSE, CAPILLARY -  Abnormal; Notable for the following components:   Glucose-Capillary 318 (*)    All other components within normal limits  GLUCOSE, CAPILLARY - Abnormal; Notable for the following components:   Glucose-Capillary 241 (*)    All other components within normal limits  GLUCOSE, CAPILLARY - Abnormal; Notable for the following components:   Glucose-Capillary 255 (*)    All other components within normal limits  CBC WITH DIFFERENTIAL/PLATELET - Abnormal; Notable for the following components:   MCH 25.9 (*)    RDW 15.9 (*)    Platelets 98 (*)    All other components within normal limits  COMPREHENSIVE METABOLIC PANEL - Abnormal; Notable for the following components:   CO2 19 (*)    Glucose, Bld 347 (*)    Calcium 8.8 (*)    All other components within normal limits  GLUCOSE, CAPILLARY - Abnormal; Notable for the following components:   Glucose-Capillary 308 (*)    All other components within normal limits  CBG MONITORING, ED - Abnormal; Notable for the following components:   Glucose-Capillary 333 (*)    All other components within normal limits  CBG MONITORING, ED - Abnormal; Notable for the following components:   Glucose-Capillary 308 (*)    All other components within normal limits    EKG EKG Interpretation  Date/Time:  Tuesday December 04 2017 17:37:21 EST Ventricular Rate:  103 PR Interval:    QRS Duration: 91 QT Interval:  343 QTC Calculation: 449 R Axis:   0 Text Interpretation:  Sinus tachycardia Low voltage, precordial leads Abnormal Q suggests anterior infarct When compared to priorl no significant changes seen.  NO STEMI Confirmed by Antony Blackbird [phone]) on 12/04/2017 7:13:26 PM   Radiology No results found.  Procedures Procedures (including critical care time)  Medications Ordered in ED Medications  insulin aspart (novoLOG) injection 0-9 Units (7 Units Subcutaneous Given 12/04/17 1839)  atorvastatin (LIPITOR) tablet 40 mg (40 mg Oral Given 12/04/17 2159)  busPIRone (BUSPAR) tablet 15 mg (15 mg Oral Given 12/04/17 1731)  chlorproMAZINE (THORAZINE) tablet 50 mg (50 mg Oral Given 12/04/17 1731)  levETIRAcetam (KEPPRA) tablet 500 mg (500 mg Oral Given 12/04/17 1732)  lisinopril (PRINIVIL,ZESTRIL) tablet 10 mg (10 mg Oral Given 12/04/17 0825)  magnesium oxide (MAG-OX) tablet 400 mg (400 mg Oral Given 12/04/17 0825)  Melatonin TABS 3 mg (3 mg Oral Given 12/04/17 2200)  QUEtiapine (SEROQUEL) tablet 100 mg (100 mg Oral Given 12/04/17 0825)  QUEtiapine (SEROQUEL) tablet 500 mg (500 mg Oral Given 12/04/17 2200)  sertraline (ZOLOFT) tablet 200 mg (200 mg Oral Given 12/04/17 0824)  topiramate (TOPAMAX) tablet 100 mg (100 mg Oral Given 12/04/17 2200)  OLANZapine zydis (ZYPREXA) disintegrating tablet 10 mg (10 mg Oral Given 12/03/17 2118)    And  LORazepam (ATIVAN) tablet 1 mg (1 mg Oral Given 12/01/17 1050)  prazosin (MINIPRESS) capsule 1 mg (1 mg Oral Given 12/04/17 2200)  SUMAtriptan (IMITREX) tablet 50 mg (has no administration in time range)  insulin aspart (novoLOG) injection 30 Units (30 Units Subcutaneous Given 12/04/17 1838)  insulin detemir (LEVEMIR) injection 70 Units (70 Units Subcutaneous Given 12/04/17 1840)  sulfamethoxazole-trimethoprim (BACTRIM,SEPTRA) 400-80 MG per tablet 1 tablet (1 tablet Oral Given 12/04/17 2200)  gabapentin (NEURONTIN) capsule 600 mg (600 mg Oral Given 12/04/17 2204)  LORazepam (ATIVAN) tablet 1 mg (1 mg Oral Given 12/01/17 2044)  sodium chloride 0.9 % bolus 1,000 mL (0 mLs Intravenous Stopped 12/04/17 2146)     Initial Impression / Assessment and Plan / ED Course  I have reviewed the triage vital signs and the nursing notes.  Pertinent labs & imaging results that were available during my care of the patient were reviewed by me and considered in my medical decision making (see chart for details).     Tina Saunders is a 32 y.o. female with a past medical history significant for schizoaffective disorder, bipolar  disorder, diabetes with prior DKA, depression, hypertension, PCOS, and is currently admitted in the behavioral health hospital for suicidal ideation who presents for seizure.  Patient says that today she was feeling fatigued and had her pre-seizure or of "tasting pennies" before she laid on the bed and had a seizure.  She reports that her last seizure was 6 months ago.  She says that it her seizure was typical for her.  She reports that she has been drinking diet sodas and feeling more dehydrated since being admitted several days ago.  She reports she is still taking her Keppra as directed.  She denies any other trauma.  She does report that when she fell off the bed she hit her left side of her head on the ground however she denies any headache at this time.  She denies any vision changes, nausea, vomiting, numbness, tingling, or weakness of extremities.  She denies any other complaints aside from feeling dehydrated with dry mouth.  She reports she is currently on antibiotics for urinary tract infection was discovered during her admission.  She says she does not feel like she is in DKA and reports feeling completely normal in regards to postictal status on arrival.  She says she was confused but that is resolved.  On exam, patient no focal neurologic deficits.  Chest and abdomen nontender.  Left side of her head nontender.  No laceration seen.  Lungs clear.   Patient was found to be tachycardic both right after the seizure and upon check-in.  Patient will be given fluids and will have  electrolytes checked to make sure she does not have DKA or other significant elect light imbalance contributing to symptoms.  Anticipate medical clearance that she can go back to her facility for further psychiatric management.  Patient's laboratory testing showed continued hyperglycemia but otherwise is reassuring.  Normal kidney function and liver function.  Electro lites reassuring.  No evidence of DKA.  Next  Patient was  given her insulin and was allowed to eat.  Patient had no difficulty with this.  Patient felt better after fluids and had no other preceding symptoms procedures.  I suspect it was a combination of sleep patterns changing while admitted, caffeine use with the diet sodas during admission, and dehydration.  Patient will follow-up with her neurologist and understood plan of care.  Pt had no other questions or concerns and was discharged back to her facility in stable condition.   Final Clinical Impressions(s) / ED Diagnoses   Final diagnoses:  Seizure [facility])    ED Discharge Orders    None      Clinical Impression: 1. Seizure [facility])     Disposition: Discharge  Condition: Good  I have discussed the results, Dx and Tx plan with the pt(& family if present). He/she/they expressed understanding and agree(s) with the plan. Discharge instructions discussed at great length. Strict return precautions discussed and pt &/or family have verbalized understanding of the instructions. No further questions at time of discharge.    Current Discharge Medication List      Follow Up: [facility] [place] [address] [phone]  Go on 12/13/2017 Please attend your hospital follow up appointment on Thursday, 12/13/17.  Nolene Ebbs, MD [address] [place] [address] [phone]     Your neurologist        , Gwenyth Allegra, MD 12/05/17 [phone]

## 2017-12-04 NOTE — Progress Notes (Signed)
CSW spoke with the patient's [facility], Enis Gash [place] [phone]).   Per Ragan, the patient is to discharge to her apartment placement in [place], [place]. According to Tuttle, the patient was re-referred to the [facility] team, however after learning that she would have be on the waiting list she declined the referral. Ragan reports that the long term plan for the patient, is for her to return to [facility] so that she can be closer to family supports. Enis Gash states that once the patient is discharged and has returned home, she plans to follow up with the patient to assist her in finding new housing.   The patient will discharge home to hear apartment in [place], [place] and will follow up with [facility] for outpatient medication management and therapy services. CSW will ask the patient if she would like to be referred to the [facility] ACTT team at discharge.   CSW will continue to follow.    Radonna Ricker, MSW, [place] Worker [facility]  Phone: [phone]

## 2017-12-04 NOTE — Progress Notes (Signed)
Consult request has been received. CSW attempting to follow up at present time.  CSW spoke to EPD who stated pt is ffrom [facility] and will return once cleared that there are no social work needs at this time.  CSW standing by if any CSW needs arise.  Alphonse Guild. Kryslyn Helbig, LCSW, LCAS, CSI Clinical Social Worker Ph: [phone]

## 2017-12-04 NOTE — Progress Notes (Addendum)
Pt found on floor seizing after her room mate called staff. Easily arousable on approach. A & O to self, place event and day of the week. Per pt "I fell, I hit my head and I have a headache". Pt endorsed being tired but denies dizziness. Vitals at time of event 109/61 spo2 98% on R/A and HR 139. Pt assisted to bed. Pt does have a h/o seizures and is managed with Keppra. Assessed by assigned psychiatrist. New order received to send pt to ED for clearance r/t hitting her head. Will call report to ED charge RN. Safety checks maintained.

## 2017-12-05 LAB — GLUCOSE, CAPILLARY
GLUCOSE-CAPILLARY: 331 mg/dL — AB (ref 70–99)
Glucose-Capillary: 161 mg/dL — ABNORMAL HIGH (ref 70–99)
Glucose-Capillary: 263 mg/dL — ABNORMAL HIGH (ref 70–99)
Glucose-Capillary: 305 mg/dL — ABNORMAL HIGH (ref 70–99)

## 2017-12-05 MED ORDER — LISINOPRIL 5 MG PO TABS
5.0000 mg | ORAL_TABLET | Freq: Every day | ORAL | Status: DC
  Administered 2017-12-06: 5 mg via ORAL
  Filled 2017-12-05 (×3): qty 1

## 2017-12-05 MED ORDER — BISACODYL 5 MG PO TBEC
10.0000 mg | DELAYED_RELEASE_TABLET | Freq: Every day | ORAL | Status: DC | PRN
  Administered 2017-12-05: 10 mg via ORAL
  Filled 2017-12-05: qty 2
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
## 2017-12-05 NOTE — Progress Notes (Addendum)
Patient ID: [name], female   DOB: 06-01-1985, 32 y.o.   MRN: [id number]  D. Pt presents with a flat affect and isolative behavior. Pt remains in bed this morning, states "I am usually tired the day after I have a seizure."  Pt currently denies SI/HI and AVH and agrees to contact staff before acting on any harmful thoughts. Per ED MD note, patients incidence yesterday was due to dehydration and change in sleep habits. Pt blood pressure is low 96/59.  A. Labs and vitals monitored. Pt given and educated on medications. Pt supported emotionally and encouraged to express concerns and ask questions. Pt encouraged to drink fluids today. Held BP medication, will recheck.  R. Pt remains safe with 15 minute checks. Will continue POC.

## 2017-12-05 NOTE — [facility] Group Notes (Signed)
[facility] Group Notes:   Nursing  Date:  12/05/2017  Time:  4:58 PM  Type of Therapy:  Relaxation/Meditation  Participation Level:  Did not attend.   Summary of Progress/Problems: Patient invited; declined to attend.  Tina Saunders 12/05/2017, 4:58 PM

## 2017-12-05 NOTE — Progress Notes (Signed)
Pt returned to the unit from the ED around 2145.  Upon entering the unit, pt was asking for a dinner tray.  The ED reported that the pt had received two trays while there and had eaten some snack foods.  Pt's CBG was checked and found to be 308.  Pt came to the med window to get her evening meds and was irritable because staff would not get her a full meal.  Staff explained to her that she needed to monitor the amount she ate d/t her diabetes.  Pt had been offered a salad or diabetic snacks, but in reference to the salad said "I don't want no salad; I ain't a rabbit".  Staff again spoke with pt about diabetic food choices, but she continued irritable and said, "I eat whatever I want at home".  Pt voiced no complaints concerning the episode this afternoon of her seizure like incident.  She is focused on medications and food.  Support and encouragement offered.  Pt makes her needs known to staff.  Discharge plans are in process.  Pt made a high fall risk.  Safety maintained with q15 minute checks.

## 2017-12-05 NOTE — [facility] Group Notes (Signed)
[facility] Group Therapy      12/05/2017 10:33 AM  Type of Therapy: [facility] Presentation  Participation Level: Did Not Participate    Summary of Progress/Problems: Invited, chose not to attend.    [place] Social Worker

## 2017-12-06 LAB — GLUCOSE, CAPILLARY
GLUCOSE-CAPILLARY: 258 mg/dL — AB (ref 70–99)
Glucose-Capillary: 269 mg/dL — ABNORMAL HIGH (ref 70–99)

## 2017-12-06 MED ORDER — BUSPIRONE HCL 15 MG PO TABS: 15.0000 mg | ORAL_TABLET | Freq: Three times a day (TID) | ORAL | 0 refills | Status: DC

## 2017-12-06 MED ORDER — PRAZOSIN HCL 1 MG PO CAPS: 1.0000 mg | ORAL_CAPSULE | Freq: Every day | ORAL | 0 refills | Status: DC

## 2017-12-06 MED ORDER — LISINOPRIL 5 MG PO TABS: 5.0000 mg | ORAL_TABLET | Freq: Every day | ORAL | 0 refills | Status: DC

## 2017-12-06 MED ORDER — MAGNESIUM OXIDE 400 (241.3 MG) MG PO TABS: 400.0000 mg | ORAL_TABLET | Freq: Every day | ORAL | Status: DC

## 2017-12-06 MED ORDER — QUETIAPINE FUMARATE 100 MG PO TABS: 100.0000 mg | ORAL_TABLET | Freq: Every day | ORAL | 0 refills | Status: DC

## 2017-12-06 MED ORDER — SULFAMETHOXAZOLE-TRIMETHOPRIM 400-80 MG PO TABS: 1.0000 | ORAL_TABLET | Freq: Two times a day (BID) | ORAL | 0 refills | Status: DC

## 2017-12-06 MED ORDER — SERTRALINE HCL 100 MG PO TABS: 200.0000 mg | ORAL_TABLET | Freq: Every day | ORAL | 0 refills | Status: AC

## 2017-12-06 MED ORDER — GABAPENTIN 300 MG PO CAPS: 600.0000 mg | ORAL_CAPSULE | Freq: Three times a day (TID) | ORAL | 0 refills | Status: DC

## 2017-12-06 MED ORDER — BISACODYL 5 MG PO TBEC: 10.0000 mg | DELAYED_RELEASE_TABLET | Freq: Every day | ORAL | 0 refills | Status: DC | PRN

## 2017-12-06 MED ORDER — INSULIN ASPART 100 UNIT/ML ~~LOC~~ SOLN: 30.0000 [IU] | Freq: Three times a day (TID) | SUBCUTANEOUS | 0 refills | Status: DC

## 2017-12-06 MED ORDER — QUETIAPINE FUMARATE 200 MG PO TABS: 500.0000 mg | ORAL_TABLET | Freq: Every day | ORAL | 1 refills | Status: DC

## 2017-12-06 MED ORDER — LEVETIRACETAM 500 MG PO TABS: 500.0000 mg | ORAL_TABLET | Freq: Two times a day (BID) | ORAL | 0 refills | Status: DC

## 2017-12-06 MED ORDER — METOPROLOL TARTRATE 25 MG PO TABS: 25.0000 mg | ORAL_TABLET | Freq: Two times a day (BID) | ORAL | 0 refills | Status: DC

## 2017-12-06 MED ORDER — "INSULIN SYRINGE-NEEDLE U-100 25G X 1"" 1 ML MISC": 1.0000 | Freq: Three times a day (TID) | 0 refills | Status: AC

## 2017-12-06 MED ORDER — TOPIRAMATE 100 MG PO TABS: 100.0000 mg | ORAL_TABLET | Freq: Every day | ORAL | 0 refills | Status: DC

## 2017-12-06 MED ORDER — SERTRALINE HCL 100 MG PO TABS: 200.0000 mg | ORAL_TABLET | Freq: Every day | ORAL | 0 refills | Status: DC

## 2017-12-06 MED ORDER — CHLORPROMAZINE HCL 50 MG PO TABS: 50.0000 mg | ORAL_TABLET | Freq: Three times a day (TID) | ORAL | 0 refills | Status: DC

## 2017-12-06 MED ORDER — INSULIN DETEMIR 100 UNIT/ML ~~LOC~~ SOLN: 70.0000 [IU] | Freq: Two times a day (BID) | SUBCUTANEOUS | 0 refills | Status: DC

## 2017-12-06 MED ORDER — ATORVASTATIN CALCIUM 40 MG PO TABS: 40.0000 mg | ORAL_TABLET | Freq: Every day | ORAL | 0 refills | Status: DC

## 2017-12-06 MED ORDER — MELATONIN 3 MG PO TABS: 3.0000 mg | ORAL_TABLET | Freq: Every day | ORAL | 0 refills | Status: DC

## 2017-12-06 NOTE — Progress Notes (Signed)
Patient ID: Tina Saunders, female   DOB: 06-07-1985, 32 y.o.   MRN: [id number]   D: Patient talking on the phone a lot tonight and went to group. Her only complaint tonight was that she was constipated. Dulcolax ordered for patient. No active SI tonight.  A: Staff will monitor on q 15 minute checks, follow treatment plan, and give medications as ordered. R: Cooperative on the unit.

## 2017-12-06 NOTE — [facility] Group Notes (Signed)
Pt was invited to psycho-ed group but did not attend. (facilitated by [facility] Michael L)

## 2017-12-06 NOTE — Progress Notes (Signed)
Nursing discharge note: Patient discharged home per MD order.  Patient received all personal belongings from unit and locker.  Reviewed AVS/transition record with patient and she indicates understanding.  Patient will follow up with [facility] and her ACTT team.  Patient denies any thoughts of self harm. She left ambulatory with sister-in-law.
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
## 2017-12-06 NOTE — Plan of Care (Signed)
  Problem: Activity: Goal: Interest or engagement in leisure activities will improve Outcome: Completed/Met Goal: Imbalance in normal sleep/wake cycle will improve Outcome: Completed/Met   Problem: Coping: Goal: Coping ability will improve Outcome: Completed/Met Goal: Will verbalize feelings Outcome: Completed/Met   Problem: Coping: Goal: Coping ability will improve Outcome: Completed/Met   Problem: Medication: Goal: Compliance with prescribed medication regimen will improve Outcome: Completed/Met   Problem: Self-Concept: Goal: Ability to disclose and discuss suicidal ideas will improve Outcome: Completed/Met Goal: Will verbalize positive feelings about self Outcome: Completed/Met   Problem: Education: Goal: Knowledge of [place] [facility] Education information/materials will improve Outcome: Completed/Met Goal: Emotional status will improve Outcome: Completed/Met Goal: Mental status will improve Outcome: Completed/Met Goal: Verbalization of understanding the information provided will improve Outcome: Completed/Met   Problem: Education: Goal: Ability to state activities that reduce stress will improve Outcome: Completed/Met   Problem: Coping: Goal: Ability to identify and develop effective coping behavior will improve Outcome: Completed/Met

## 2017-12-06 NOTE — Progress Notes (Signed)
  [facility] Adult Case Management Discharge Plan :  Will you be returning to the same living situation after discharge:  Yes,  patient reports she is returning home at discharge At discharge, do you have transportation home?: Yes,  pateint reports her sister-in-law was picking her up at discharge Do you have the ability to pay for your medications: Yes,  [employer], [employer]  Release of information consent forms completed and in the chart;  Patient's signature needed at discharge.  Patient to Follow up at: Follow-up Information    [facility]. Go on 12/13/2017.   Specialty:  Behavioral Health Why:  Please attend your hospital follow up appointment on Thursday, 12/13/17. Please inquire about your placement on the ACTT team wait list.  Contact information: [place] [address] [phone]        Nolene Ebbs, MD. Go on 12/10/2017.   Specialty:  Internal Medicine Why:  Your follow up appointment with Dr. Nolene Ebbs for primary care services is Monday, 12/10/17 at 3:30pm. Please be sure to bring any dicharge paperwork from this hospitalization.  Contact information: [address] [place] [address] [place]. Call.   Why:  Psychiatrist is recommending you follow up with neurology services for your seizure disorder. Please be sure to follow up if interesred in additional services.  Contact information: [place], [place] [phone]          Next level of care provider has access to [place] and Suicide Prevention discussed: Yes,  with the patient and patient's care coordinator  Have you used any form of tobacco in the last 30 days? (Cigarettes, Smokeless Tobacco, Cigars, and/or Pipes): No  Has patient been referred to the Quitline?: N/A patient is not a smoker  Patient has been referred for addiction treatment: N/A  Marylee Floras,  [place] 12/06/2017, 1:12 PM
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
## 2017-12-06 NOTE — [facility] Group Notes (Signed)
Pt was invited but did not attend orientation group facilitated by [facility] Michael L.

## 2017-12-09 ENCOUNTER — Emergency Department (HOSPITAL_COMMUNITY)
Admission: EM | Admit: 2017-12-09 | Discharge: 2017-12-09 | Disposition: A | Discharge status: Home / Self Care | Payer: Medicare HMO | Attending: Emergency Medicine | Admitting: Emergency Medicine

## 2017-12-09 ENCOUNTER — Encounter (HOSPITAL_COMMUNITY): Payer: Self-pay | Admitting: Emergency Medicine

## 2017-12-09 ENCOUNTER — Other Ambulatory Visit: Payer: Self-pay

## 2017-12-09 ENCOUNTER — Emergency Department (HOSPITAL_COMMUNITY): Payer: Medicare HMO

## 2017-12-09 DIAGNOSIS — E1165 Type 2 diabetes mellitus with hyperglycemia: Secondary | ICD-10-CM | POA: Diagnosis not present

## 2017-12-09 DIAGNOSIS — R0902 Hypoxemia: Secondary | ICD-10-CM | POA: Diagnosis not present

## 2017-12-09 DIAGNOSIS — J45909 Unspecified asthma, uncomplicated: Secondary | ICD-10-CM | POA: Diagnosis not present

## 2017-12-09 DIAGNOSIS — I10 Essential (primary) hypertension: Secondary | ICD-10-CM | POA: Insufficient documentation

## 2017-12-09 DIAGNOSIS — R1110 Vomiting, unspecified: Secondary | ICD-10-CM | POA: Diagnosis not present

## 2017-12-09 DIAGNOSIS — Z79899 Other long term (current) drug therapy: Secondary | ICD-10-CM | POA: Diagnosis not present

## 2017-12-09 DIAGNOSIS — R52 Pain, unspecified: Secondary | ICD-10-CM | POA: Diagnosis not present

## 2017-12-09 DIAGNOSIS — R1084 Generalized abdominal pain: Secondary | ICD-10-CM | POA: Diagnosis not present

## 2017-12-09 DIAGNOSIS — R112 Nausea with vomiting, unspecified: Secondary | ICD-10-CM | POA: Diagnosis not present

## 2017-12-09 DIAGNOSIS — R1031 Right lower quadrant pain: Secondary | ICD-10-CM | POA: Insufficient documentation

## 2017-12-09 DIAGNOSIS — R42 Dizziness and giddiness: Secondary | ICD-10-CM | POA: Diagnosis not present

## 2017-12-09 DIAGNOSIS — Z794 Long term (current) use of insulin: Secondary | ICD-10-CM | POA: Diagnosis not present

## 2017-12-09 LAB — BASIC METABOLIC PANEL
ANION GAP: 9 (ref 5–15)
BUN: 6 mg/dL (ref 6–20)
CHLORIDE: 104 mmol/L (ref 98–111)
CO2: 19 mmol/L — ABNORMAL LOW (ref 22–32)
CREATININE: 0.5 mg/dL (ref 0.44–1.00)
Calcium: 8.4 mg/dL — ABNORMAL LOW (ref 8.9–10.3)
GFR calc non Af Amer: >60 mL/min (ref >60)
Glucose, Bld: 473 mg/dL — ABNORMAL HIGH (ref 70–99)
POTASSIUM: 4 mmol/L (ref 3.5–5.1)
SODIUM: 132 mmol/L — AB (ref 135–145)

## 2017-12-09 LAB — CBC
HEMATOCRIT: 35.5 % — AB (ref 36.0–46.0)
HEMOGLOBIN: 11.2 g/dL — AB (ref 12.0–15.0)
MCH: 25.9 pg — ABNORMAL LOW (ref 26.0–34.0)
MCHC: 31.5 g/dL (ref 30.0–36.0)
MCV: 82.2 fL (ref 80.0–100.0)
NRBC: 0 % (ref 0.0–0.2)
Platelets: 88 10*3/uL — ABNORMAL LOW (ref 150–400)
RBC: 4.32 MIL/uL (ref 3.87–5.11)
RDW: 15.6 % — AB (ref 11.5–15.5)
WBC: 3.5 10*3/uL — ABNORMAL LOW (ref 4.0–10.5)

## 2017-12-09 LAB — CBG MONITORING, ED: GLUCOSE-CAPILLARY: 458 mg/dL — AB (ref 70–99)

## 2017-12-09 LAB — URINALYSIS, ROUTINE W REFLEX MICROSCOPIC
BILIRUBIN URINE: NEGATIVE
Glucose, UA: >=500 mg/dL — AB
Ketones, ur: NEGATIVE mg/dL
NITRITE: NEGATIVE
Protein, ur: NEGATIVE mg/dL
SPECIFIC GRAVITY, URINE: 1.029 (ref 1.005–1.030)
pH: 5 (ref 5.0–8.0)

## 2017-12-09 LAB — I-STAT BETA HCG BLOOD, ED (MC, WL, AP ONLY): I-stat hCG, quantitative: <5 m[IU]/mL (ref <5)

## 2017-12-09 LAB — LIPASE, BLOOD: LIPASE: 30 U/L (ref 11–51)

## 2017-12-09 MED ORDER — IOHEXOL 300 MG/ML  SOLN
100.0000 mL | Freq: Once | INTRAMUSCULAR | Status: AC | PRN
  Administered 2017-12-09: 100 mL via INTRAVENOUS

## 2017-12-09 MED ORDER — HYDROMORPHONE HCL 1 MG/ML IJ SOLN
1.0000 mg | Freq: Once | INTRAMUSCULAR | Status: AC
  Administered 2017-12-09: 1 mg via INTRAVENOUS
  Filled 2017-12-09: qty 1

## 2017-12-09 MED ORDER — ONDANSETRON HCL 4 MG PO TABS: 4.0000 mg | ORAL_TABLET | Freq: Three times a day (TID) | ORAL | 0 refills | Status: DC | PRN

## 2017-12-09 MED ORDER — DICYCLOMINE HCL 20 MG PO TABS: 20.0000 mg | ORAL_TABLET | Freq: Two times a day (BID) | ORAL | 0 refills | Status: AC

## 2017-12-09 MED ORDER — SODIUM CHLORIDE 0.9 % IV BOLUS
1000.0000 mL | Freq: Once | INTRAVENOUS | Status: AC
  Administered 2017-12-09: 1000 mL via INTRAVENOUS

## 2017-12-09 MED ORDER — ONDANSETRON HCL 4 MG/2ML IJ SOLN
4.0000 mg | Freq: Once | INTRAMUSCULAR | Status: AC
  Administered 2017-12-09: 4 mg via INTRAVENOUS
  Filled 2017-12-09: qty 2

## 2017-12-09 NOTE — Discharge Instructions (Signed)
Your CT scan today showed that there was nothing wrong with your appendix or anything else in your stomach that could be the cause of your pain today. You possibly could have a stomach virus that may pass in the next couple of days. I have prescribed you two medications: Bentyl (for stomach pain) and Zofran (for nausea).  Please follow-up with your PCP if the pain does not improve after 1 week.  Return to the emergency if you have any of the following symptoms abdominal pain plus: fever; blood in stool or urine; vaginal pain, discharge or bleeding.  Thank you for allowing [place] to take care of you today.

## 2017-12-09 NOTE — ED Provider Notes (Signed)
[facility] EMERGENCY DEPARTMENT Provider Note  CSN: [id number] Arrival date & time: 12/09/17  1734  History   Chief Complaint Chief Complaint  Patient presents with  . Hyperglycemia    HPI Tina Saunders is a 32 y.o. female with a medical history of Type 2 DM, HTN and PCOS who presented to the ED for  Abdominal Pain   This is a new problem. Episode onset: 10am this morning. The problem occurs constantly. The problem has not changed since onset.The pain is associated with an unknown factor. The pain is located in the RLQ. The quality of the pain is sharp and cramping. The pain is severe. Associated symptoms include nausea and vomiting. Pertinent negatives include fever, diarrhea, hematochezia, melena, constipation, dysuria, frequency, hematuria, arthralgias and myalgias. Associated symptoms comments: Denies vaginal pain, discharge or bleeding. Exacerbated by: laying on her back. Nothing relieves the symptoms. Past workup includes surgery (cholecystectomy, hernia repair and ovarian cyst removal).    Past Medical History:  Diagnosis Date  . Anxiety   . Asthma   . Bipolar 1 disorder ([place])   . Cancer of abdominal wall   . Depression   . Diabetes mellitus without complication ([place])   . High cholesterol   . Hypertension   . Intentional drug overdose ([place]) 07/26/2017   Seroquel and buspirone   . Obesity   . Obesity   . Polycystic ovarian syndrome 07/01/2011   Patient report  . 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 Schizophrenia ([place])   . Seizures [facility])     Patient Active Problem List   Diagnosis Date Noted  . 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 Adjustment disorder with mixed disturbance of emotions and conduct   . QT prolongation   . Suicide attempt ([place])   . Overdose of antidepressant, intentional self-harm, initial encounter ([place])   . Overdose  07/26/2017  . Suicide attempt by other psychotropic drug overdose ([place])   . DKA (diabetic ketoacidoses) ([place]) 03/14/2017  . Chronic constipation   . Seizures ([place]) 12/11/2016  . Diabetes mellitus type 2 in obese ([place]) 12/11/2016  . Uncontrolled diabetes mellitus ([place]) 11/03/2016  . Schizoaffective disorder, bipolar type ([place]) 11/02/2016  . 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 Suicidal ideation   . 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 HTN (hypertension) 04/15/2013  . Posttraumatic stress disorder 12/07/2011  . PSVT (paroxysmal supraventricular tachycardia) ([place]) 08/26/2011  . ADHD 09/23/2007  . EPIGASTRIC PAIN 09/23/2007  . Obesity, unspecified 07/30/2007  . Schizoaffective disorder, depressive type ([place]) 07/30/2007  . SLEEP DISORDER 07/30/2007  . METRORRHAGIA 06/13/2006  . DISORDER, MENSTRUAL NEC 06/13/2006  . POLYCYSTIC OVARIAN DISEASE 04/25/2006  . AMENORRHEA, SECONDARY 04/20/2006  . ACNE, MILD 04/20/2006    Past Surgical History:  Procedure Laterality Date  . CHOLECYSTECTOMY    . COLONOSCOPY WITH PROPOFOL N/A 03/08/2017   Procedure: COLONOSCOPY WITH PROPOFOL;  Surgeon: Milus Banister, MD;  Location: [facility] ENDOSCOPY;  Service: Endoscopy;  Laterality: N/A;  . HERNIA REPAIR    . Ovarian Cyst Excision    . VARICOSE VEIN SURGERY       OB History    Gravida  0   Para      Term      Preterm      AB      Living        SAB      TAB  Ectopic      Multiple      Live Births               Home Medications    Prior to Admission medications   Medication Sig Start Date End Date Taking? Authorizing Provider  atorvastatin (LIPITOR) 40 MG tablet Take 1 tablet (40 mg total) by mouth at bedtime. For high cholesterol 12/06/17   Lindell Spar I, NP  bisacodyl (DULCOLAX) 5 MG EC tablet Take 2 tablets (10 mg total) by mouth daily as needed for moderate constipation. (May buy from over the counter): For constipation 12/06/17   Lindell Spar I, NP    busPIRone (BUSPAR) 15 MG tablet Take 1 tablet (15 mg total) by mouth 3 (three) times daily. For anxiety 12/06/17   Lindell Spar I, NP  chlorproMAZINE (THORAZINE) 50 MG tablet Take 1 tablet (50 mg total) by mouth 3 (three) times daily. For mood control 12/06/17   Lindell Spar I, NP  dicyclomine (BENTYL) 20 MG tablet Take 1 tablet (20 mg total) by mouth 2 (two) times daily for 7 days. 12/09/17 12/16/17  , Alvie Heidelberg I, PA-C  gabapentin (NEURONTIN) 300 MG capsule Take 2 capsules (600 mg total) by mouth 3 (three) times daily. For agitation 12/06/17   Lindell Spar I, NP  insulin aspart (NOVOLOG) 100 UNIT/ML injection Inject 30 Units into the skin 3 (three) times daily with meals. For diabetes management 12/06/17   Lindell Spar I, NP  insulin detemir (LEVEMIR) 100 UNIT/ML injection Inject 0.7 mLs (70 Units total) into the skin 2 (two) times daily. For diabetes management 12/06/17   Lindell Spar I, NP  Insulin Syringe-Needle U-100 25G X 1" 1 ML MISC 1 Syringe by Does not apply route 4 (four) times daily -  before meals and at bedtime. For blood sugar checks 12/06/17   Lindell Spar I, NP  levETIRAcetam (KEPPRA) 500 MG tablet Take 1 tablet (500 mg total) by mouth 2 (two) times daily. For seizure activities 12/06/17   Lindell Spar I, NP  lisinopril (PRINIVIL,ZESTRIL) 5 MG tablet Take 1 tablet (5 mg total) by mouth daily. For high blood pressure 12/07/17   [place], Herbert Pun I, NP  magnesium oxide (MAG-OX) 400 (241.3 Mg) MG tablet Take 1 tablet (400 mg total) by mouth daily. For acid rflux 12/07/17   Lindell Spar I, NP  Melatonin 3 MG TABS Take 1 tablet (3 mg total) by mouth at bedtime. For sleep 12/06/17   Lindell Spar I, NP  metoprolol tartrate (LOPRESSOR) 25 MG tablet Take 1 tablet (25 mg total) by mouth 2 (two) times daily. For high blood pressure 12/06/17   [place], Herbert Pun I, NP  ondansetron (ZOFRAN) 4 MG tablet Take 1 tablet (4 mg total) by mouth every 8 (eight) hours as needed for nausea or vomiting. 12/09/17    , Alvie Heidelberg I, PA-C  prazosin (MINIPRESS) 1 MG capsule Take 1 capsule (1 mg total) by mouth at bedtime. For nightmares 12/06/17   Lindell Spar I, NP  QUEtiapine (SEROQUEL) 100 MG tablet Take 1 tablet (100 mg total) by mouth daily. For mood control 12/06/17   Lindell Spar I, NP  QUEtiapine (SEROQUEL) 200 MG tablet Take 2.5 tablets (500 mg total) by mouth at bedtime. For mood control 12/06/17   Lindell Spar I, NP  sertraline (ZOLOFT) 100 MG tablet Take 2 tablets (200 mg total) by mouth daily. Fordepression 12/07/17   Lindell Spar I, NP  sulfamethoxazole-trimethoprim (BACTRIM,SEPTRA) 400-80 MG tablet Take 1 tablet by mouth every 12 (  twelve) hours. For uti 12/06/17   Lindell Spar I, NP  topiramate (TOPAMAX) 100 MG tablet Take 1 tablet (100 mg total) by mouth at bedtime. For mood stabilization 12/06/17   Encarnacion Slates, NP    Family History Family History  Problem Relation Age of Onset  . Coronary artery disease Maternal Grandmother   . Diabetes type II Maternal Grandmother   . Cancer Maternal Grandmother   . Hypertension Mother   . Hypertension Father     Social History Social History   Tobacco Use  . Smoking status: Never Smoker  . Smokeless tobacco: Never Used  Substance Use Topics  . Alcohol use: No  . Drug use: No     Allergies   Fish-derived products; Geodon [ziprasidone hcl]; Haldol [haloperidol lactate]; Buprenorphine hcl; Compazine [prochlorperazine]; Morphine and related; and Toradol [ketorolac tromethamine]   Review of Systems Review of Systems  Constitutional: Negative for fever.  Gastrointestinal: Positive for abdominal pain, nausea and vomiting. Negative for constipation, diarrhea, hematochezia and melena.  Genitourinary: Negative for dysuria, frequency and hematuria.  Musculoskeletal: Negative for arthralgias and myalgias.  All other systems reviewed and are negative.    Physical Exam Updated Vital Signs BP 117/73   Pulse 89   Resp 16   Ht 5\' 4"   (1.626 m)   Wt 97.5 kg   LMP 10/29/2017   SpO2 97%   BMI 36.90 kg/m   Physical Exam  Constitutional: Vital signs are normal. She is cooperative. No distress.  Obese. Sitting comfortably on the bed.  Cardiovascular: Normal rate, regular rhythm and normal heart sounds.  No murmur heard. Pulmonary/Chest: Effort normal and breath sounds normal.  Abdominal: Soft. Normal appearance and bowel sounds are normal. There is tenderness in the right lower quadrant. There is no rigidity, no rebound, no guarding and no tenderness at McBurney's point.  Musculoskeletal: Normal range of motion.  Neurological: She is alert.  Skin: Skin is warm and intact. Capillary refill takes less than 2 seconds. She is not diaphoretic. No pallor.  Psychiatric: She has a normal mood and affect. Her speech is normal and behavior is normal.  Nursing note and vitals reviewed.  ED Treatments / Results  Labs (all labs ordered are listed, but only abnormal results are displayed) Labs Reviewed  BASIC METABOLIC PANEL - Abnormal; Notable for the following components:      Result Value   Sodium 132 (*)    CO2 19 (*)    Glucose, Bld 473 (*)    Calcium 8.4 (*)    All other components within normal limits  CBC - Abnormal; Notable for the following components:   WBC 3.5 (*)    Hemoglobin 11.2 (*)    HCT 35.5 (*)    MCH 25.9 (*)    RDW 15.6 (*)    Platelets 88 (*)    All other components within normal limits  URINALYSIS, ROUTINE W REFLEX MICROSCOPIC - Abnormal; Notable for the following components:   Glucose, UA >=500 (*)    Hgb urine dipstick MODERATE (*)    Leukocytes, UA MODERATE (*)    Bacteria, UA RARE (*)    All other components within normal limits  CBG MONITORING, ED - Abnormal; Notable for the following components:   Glucose-Capillary 458 (*)    All other components within normal limits  URINE CULTURE  LIPASE, BLOOD  I-STAT BETA HCG BLOOD, ED ([facility], [facility], [facility] ONLY)    EKG None  Radiology Ct Abdomen  Pelvis W Contrast  Result  Date: 12/09/2017 CLINICAL DATA:  Right lower quadrant pain with nausea and vomiting. History of rhabdo sarcoma/abdominal wall cancer. EXAM: CT ABDOMEN AND PELVIS WITH CONTRAST TECHNIQUE: Multidetector CT imaging of the abdomen and pelvis was performed using the standard protocol following bolus administration of intravenous contrast. CONTRAST:  187mL OMNIPAQUE IOHEXOL 300 MG/ML  SOLN COMPARISON:  October 28, 2017 FINDINGS: Lower chest: There is a tiny right pleural effusion. The lung bases are otherwise normal. Hepatobiliary: Hepatic steatosis. No focal liver masses. The portal vein is patent. The patient is status post cholecystectomy. The liver measures 24.7 cm in cranial caudal dimension, similar in the interval, consistent with hepatomegaly. Pancreas: Unremarkable. No pancreatic ductal dilatation or surrounding inflammatory changes. Spleen: The spleen measures 21 cm in cranial caudal dimension, unchanged in the interval, consistent with splenomegaly. Focal low-attenuation in the periphery of the spleen on series 3, image 19 is unchanged since at least January of 2019, of no acute significance. Adrenals/Urinary Tract: Adrenal glands are unremarkable. Kidneys are normal, without renal calculi, focal lesion, or hydronephrosis. Bladder is unremarkable. Stomach/Bowel: The stomach and small bowel are normal. The colon and appendix are normal. Vascular/Lymphatic: No significant vascular findings are present. No enlarged abdominal or pelvic lymph nodes. Reproductive: Uterus and bilateral adnexa are unremarkable. Other: Postsurgical changes to the anterior abdominal wall, unchanged. Musculoskeletal: No acute or significant osseous findings. IMPRESSION: 1. Tiny right pleural effusion of uncertain etiology or significance. 2. Hepatic steatosis.  Hepatomegaly.  Splenomegaly. 3. No other significant abnormalities. No convincing cause for the patient's right lower quadrant pain identified.  Electronically Signed   By: Dorise Bullion III M.D   On: 12/09/2017 20:05    Procedures Procedures (including critical care time)  Medications Ordered in ED Medications  ondansetron [facility]) injection 4 mg (4 mg Intravenous Given 12/09/17 1829)  HYDROmorphone (DILAUDID) injection 1 mg (1 mg Intravenous Given 12/09/17 1828)  sodium chloride 0.9 % bolus 1,000 mL (1,000 mLs Intravenous New Bag/Given 12/09/17 1840)  iohexol (OMNIPAQUE) 300 MG/ML solution 100 mL (100 mLs Intravenous Contrast Given 12/09/17 1933)     Initial Impression / Assessment and Plan / ED Course  Triage vital signs and the nursing notes have been reviewed.  Pertinent labs & imaging results that were available during care of the patient were reviewed and considered in medical decision making (see chart for details).  Patient presents to the ED with primary complaints of acute onset RLQ abdominal pain. Clinical presentation and history is concerning for appendicitis. Will obtain abdominal imaging for evaluation in addition to typical abdominal pain labs. It was also discovered upon arrival that patient was hyperglycemic with CBG > 450. She reports no recent illness and that she has been compliant with her insulin regimen. Concern for DKA as well and will evaluate accordingly.  Clinical Course as of Dec 10 2023  Nancy Fetter Dec 09, 2017  1827 Elevated blood and urine glucose, but remaining labs in conjunction with clinical presentation not consistent with DKA. No anion gap. UA suggestive of UTI, but patient has no urinary complaints. Will send urine for culture. Will hold on treatment currently.   [GM]  1937 Leukopenic at 3.5 which is around patient's baseline WBC of 4.0. She is on multiple antipsychotics which has a side effect of leukopenia. Remaining CBC consistent with patient's baseline.   [GM]  2015 CT abd/pelvis results shows no acute intra-abdominal findings that is source of patient's pain. No appendicitis, peritonitis  or free fluid in abdomen noted. Fatty liver seen which is probably  given pt's other chronic conditions.   [GM]    Clinical Course User Index [GM] , Jonelle Sports, PA-C   Final Clinical Impressions(s) / ED Diagnoses  1. RLQ Abdominal Pain. Likely due to viral etiology. Rx for Bentyl and Zofran given for symptom relief. Education provided on s/s that warrant follow-up with PCP vs ED.  Dispo: Home. After thorough clinical evaluation, this patient is determined to be medically stable and can be safely discharged with the previously mentioned treatment and/or outpatient follow-up/referral(s). At this time, there are no other apparent medical conditions that require further screening, evaluation or treatment.   Final diagnoses:  Right lower quadrant abdominal pain    ED Discharge Orders         Ordered    dicyclomine (BENTYL) 20 MG tablet  2 times daily     12/09/17 2025    ondansetron (ZOFRAN) 4 MG tablet  Every 8 hours PRN     12/09/17 2025            Junita Push 12/09/17 2026    Davonna Belling, MD 12/09/17 [id number]

## 2017-12-09 NOTE — ED Notes (Signed)
Patient verbalizes understanding of discharge instructions. Opportunity for questioning and answers were provided. Armband removed by staff, pt discharged from ED.  

## 2017-12-09 NOTE — ED Notes (Signed)
Pt returned from CT °

## 2017-12-09 NOTE — ED Triage Notes (Signed)
Patient arrived via EMS from home, c/o hyperglycemia with lower right abdominal pain and vomiting. EMS reported CBG of 467. Hx of DM, prolonged QT. Just recently finished antibiotics(bactrim) for UTI. EMS gave 4mg  Zofran PTA.

## 2017-12-09 NOTE — ED Notes (Signed)
Patient transported to CT 

## 2017-12-11 LAB — URINE CULTURE

## 2017-12-16 ENCOUNTER — Other Ambulatory Visit: Payer: Self-pay

## 2017-12-16 ENCOUNTER — Encounter (HOSPITAL_COMMUNITY): Payer: Self-pay

## 2017-12-16 ENCOUNTER — Emergency Department (HOSPITAL_COMMUNITY)
Admission: EM | Admit: 2017-12-16 | Discharge: 2017-12-16 | Disposition: A | Discharge status: Home / Self Care | Payer: Medicare HMO | Attending: Emergency Medicine | Admitting: Emergency Medicine

## 2017-12-16 DIAGNOSIS — Z79899 Other long term (current) drug therapy: Secondary | ICD-10-CM | POA: Insufficient documentation

## 2017-12-16 DIAGNOSIS — I10 Essential (primary) hypertension: Secondary | ICD-10-CM | POA: Insufficient documentation

## 2017-12-16 DIAGNOSIS — Z794 Long term (current) use of insulin: Secondary | ICD-10-CM | POA: Diagnosis not present

## 2017-12-16 DIAGNOSIS — E1165 Type 2 diabetes mellitus with hyperglycemia: Secondary | ICD-10-CM | POA: Diagnosis not present

## 2017-12-16 DIAGNOSIS — R531 Weakness: Secondary | ICD-10-CM | POA: Diagnosis not present

## 2017-12-16 DIAGNOSIS — R42 Dizziness and giddiness: Secondary | ICD-10-CM | POA: Diagnosis not present

## 2017-12-16 DIAGNOSIS — R739 Hyperglycemia, unspecified: Secondary | ICD-10-CM

## 2017-12-16 LAB — I-STAT VENOUS BLOOD GAS, ED
ACID-BASE DEFICIT: 5 mmol/L — AB (ref 0.0–2.0)
Bicarbonate: 20.9 mmol/L (ref 20.0–28.0)
O2 Saturation: 83 %
PO2 VEN: 51 mmHg — AB (ref 32.0–45.0)
TCO2: 22 mmol/L (ref 22–32)
pCO2, Ven: 39 mmHg — ABNORMAL LOW (ref 44.0–60.0)
pH, Ven: 7.338 (ref 7.250–7.430)

## 2017-12-16 LAB — CBC WITH DIFFERENTIAL/PLATELET
Abs Immature Granulocytes: 0.01 10*3/uL (ref 0.00–0.07)
BASOS PCT: 0 %
Basophils Absolute: 0 10*3/uL (ref 0.0–0.1)
Eosinophils Absolute: 0.1 10*3/uL (ref 0.0–0.5)
Eosinophils Relative: 4 %
HCT: 36.4 % (ref 36.0–46.0)
Hemoglobin: 11.2 g/dL — ABNORMAL LOW (ref 12.0–15.0)
Immature Granulocytes: 0 %
LYMPHS PCT: 26 %
Lymphs Abs: 0.9 10*3/uL (ref 0.7–4.0)
MCH: 25.5 pg — AB (ref 26.0–34.0)
MCHC: 30.8 g/dL (ref 30.0–36.0)
MCV: 82.9 fL (ref 80.0–100.0)
Monocytes Absolute: 0.2 10*3/uL (ref 0.1–1.0)
Monocytes Relative: 5 %
NEUTROS ABS: 2.2 10*3/uL (ref 1.7–7.7)
NEUTROS PCT: 65 %
NRBC: 0 % (ref 0.0–0.2)
PLATELETS: 92 10*3/uL — AB (ref 150–400)
RBC: 4.39 MIL/uL (ref 3.87–5.11)
RDW: 15.5 % (ref 11.5–15.5)
WBC: 3.4 10*3/uL — AB (ref 4.0–10.5)

## 2017-12-16 LAB — CBG MONITORING, ED
GLUCOSE-CAPILLARY: 327 mg/dL — AB (ref 70–99)
Glucose-Capillary: 442 mg/dL — ABNORMAL HIGH (ref 70–99)
Glucose-Capillary: 271 mg/dL — ABNORMAL HIGH (ref 70–99)

## 2017-12-16 LAB — COMPREHENSIVE METABOLIC PANEL
ALT: 34 U/L (ref 0–44)
ANION GAP: 14 (ref 5–15)
AST: 42 U/L — ABNORMAL HIGH (ref 15–41)
Albumin: 3.4 g/dL — ABNORMAL LOW (ref 3.5–5.0)
Alkaline Phosphatase: 122 U/L (ref 38–126)
BUN: 7 mg/dL (ref 6–20)
CHLORIDE: 97 mmol/L — AB (ref 98–111)
CO2: 17 mmol/L — AB (ref 22–32)
Calcium: 8.7 mg/dL — ABNORMAL LOW (ref 8.9–10.3)
Creatinine, Ser: 0.54 mg/dL (ref 0.44–1.00)
GFR calc Af Amer: >60 mL/min (ref >60)
Glucose, Bld: 623 mg/dL (ref 70–99)
POTASSIUM: 3.6 mmol/L (ref 3.5–5.1)
SODIUM: 128 mmol/L — AB (ref 135–145)
Total Bilirubin: 0.4 mg/dL (ref 0.3–1.2)
Total Protein: 7.1 g/dL (ref 6.5–8.1)

## 2017-12-16 LAB — URINALYSIS, ROUTINE W REFLEX MICROSCOPIC
Bilirubin Urine: NEGATIVE
Glucose, UA: >=500 mg/dL — AB
Ketones, ur: NEGATIVE mg/dL
Nitrite: NEGATIVE
PROTEIN: NEGATIVE mg/dL
Specific Gravity, Urine: 1.035 — ABNORMAL HIGH (ref 1.005–1.030)
pH: 6 (ref 5.0–8.0)

## 2017-12-16 LAB — PREGNANCY, URINE: PREG TEST UR: NEGATIVE

## 2017-12-16 LAB — LIPASE, BLOOD: Lipase: 36 U/L (ref 11–51)

## 2017-12-16 MED ORDER — DEXTROSE-NACL 5-0.45 % IV SOLN: INTRAVENOUS | Status: DC

## 2017-12-16 MED ORDER — SODIUM CHLORIDE 0.9 % IV BOLUS
3000.0000 mL | Freq: Once | INTRAVENOUS | Status: AC
  Administered 2017-12-16: 3000 mL via INTRAVENOUS

## 2017-12-16 MED ORDER — INSULIN REGULAR(HUMAN) IN NACL 100-0.9 UT/100ML-% IV SOLN
INTRAVENOUS | Status: DC
  Administered 2017-12-16: 3.8 [IU]/h via INTRAVENOUS
  Filled 2017-12-16: qty 100

## 2017-12-16 NOTE — ED Triage Notes (Signed)
To [facility] via EMS.  Pt reported that she thinks she had seizure, she woke up on floor this morning, blood sugar high, and dizzy.    Pt took Levemir 70 units @ 12p

## 2017-12-16 NOTE — ED Provider Notes (Signed)
[facility] EMERGENCY DEPARTMENT Provider Note   CSN: [id number] Arrival date & time: 12/16/17  1453     History   Chief Complaint Chief Complaint  Patient presents with  . Hyperglycemia    HPI Tina Saunders is a 32 y.o. female.  HPI   She complains of elevated blood sugar for 2 days, despite taking her usual medicines.  She also complains of not eating well because she is not hungry for several days.  She complains of generalized weakness and dizziness.  She denies fever, chills, nausea, vomiting.  She has mild abdominal discomfort over the last 3 days.  She denies dysuria, urinary frequency, hematuria, constipation or diarrhea.  He was evaluated in the ED, 1 week ago for abdominal pain and at that time was prescribed Bentyl and Zofran, after CT imaging.  There are no other known modifying factors.  Past Medical History:  Diagnosis Date  . Anxiety   . Asthma   . Bipolar 1 disorder ([place])   . Cancer of abdominal wall   . Depression   . Diabetes mellitus without complication ([place])   . High cholesterol   . Hypertension   . Intentional drug overdose ([place]) 07/26/2017   Seroquel and buspirone   . Obesity   . Obesity   . Polycystic ovarian syndrome 07/01/2011   Patient report  . 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 Schizophrenia ([place])   . Seizures [facility])     Patient Active Problem List   Diagnosis Date Noted  . 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 Adjustment disorder with mixed disturbance of emotions and conduct   . QT prolongation   . Suicide attempt ([place])   . Overdose of antidepressant, intentional self-harm, initial encounter ([place])   . Overdose 07/26/2017  . Suicide attempt by other psychotropic drug overdose ([place])   . DKA (diabetic ketoacidoses) ([place]) 03/14/2017  . Chronic constipation   . Seizures ([place]) 12/11/2016  . Diabetes mellitus  type 2 in obese ([place]) 12/11/2016  . Uncontrolled diabetes mellitus ([place]) 11/03/2016  . Schizoaffective disorder, bipolar type ([place]) 11/02/2016  . Cluster B personality disorder ([place]) 11/02/2016  . Suicidal ideation   . 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 HTN (hypertension) 04/15/2013  . Posttraumatic stress disorder 12/07/2011  . PSVT (paroxysmal supraventricular tachycardia) ([place]) 08/26/2011  . ADHD 09/23/2007  . EPIGASTRIC PAIN 09/23/2007  . Obesity, unspecified 07/30/2007  . Schizoaffective disorder, depressive type ([place]) 07/30/2007  . SLEEP DISORDER 07/30/2007  . METRORRHAGIA 06/13/2006  . DISORDER, MENSTRUAL NEC 06/13/2006  . POLYCYSTIC OVARIAN DISEASE 04/25/2006  . AMENORRHEA, SECONDARY 04/20/2006  . ACNE, MILD 04/20/2006    Past Surgical History:  Procedure Laterality Date  . CHOLECYSTECTOMY    . COLONOSCOPY WITH PROPOFOL N/A 03/08/2017   Procedure: COLONOSCOPY WITH PROPOFOL;  Surgeon: Milus Banister, MD;  Location: [facility] ENDOSCOPY;  Service: Endoscopy;  Laterality: N/A;  . HERNIA REPAIR    . Ovarian Cyst Excision    . VARICOSE VEIN SURGERY       OB History    Gravida  0   Para      Term      Preterm      AB      Living        SAB      TAB      Ectopic      Multiple  Live Births               Home Medications    Prior to Admission medications   Medication Sig Start Date End Date Taking? Authorizing Provider  atorvastatin (LIPITOR) 40 MG tablet Take 1 tablet (40 mg total) by mouth at bedtime. For high cholesterol 12/06/17   Lindell Spar I, NP  bisacodyl (DULCOLAX) 5 MG EC tablet Take 2 tablets (10 mg total) by mouth daily as needed for moderate constipation. (May buy from over the counter): For constipation 12/06/17   Lindell Spar I, NP  busPIRone (BUSPAR) 15 MG tablet Take 1 tablet (15 mg total) by mouth 3 (three) times daily. For anxiety 12/06/17   Lindell Spar I, NP  chlorproMAZINE (THORAZINE) 50 MG tablet Take 1 tablet (50 mg total)  by mouth 3 (three) times daily. For mood control 12/06/17   Lindell Spar I, NP  dicyclomine (BENTYL) 20 MG tablet Take 1 tablet (20 mg total) by mouth 2 (two) times daily for 7 days. 12/09/17 12/16/17  Mortis, Alvie Heidelberg I, PA-C  gabapentin (NEURONTIN) 300 MG capsule Take 2 capsules (600 mg total) by mouth 3 (three) times daily. For agitation 12/06/17   Lindell Spar I, NP  insulin aspart (NOVOLOG) 100 UNIT/ML injection Inject 30 Units into the skin 3 (three) times daily with meals. For diabetes management 12/06/17   Lindell Spar I, NP  insulin detemir (LEVEMIR) 100 UNIT/ML injection Inject 0.7 mLs (70 Units total) into the skin 2 (two) times daily. For diabetes management 12/06/17   Lindell Spar I, NP  Insulin Syringe-Needle U-100 25G X 1" 1 ML MISC 1 Syringe by Does not apply route 4 (four) times daily -  before meals and at bedtime. For blood sugar checks 12/06/17   Lindell Spar I, NP  levETIRAcetam (KEPPRA) 500 MG tablet Take 1 tablet (500 mg total) by mouth 2 (two) times daily. For seizure activities 12/06/17   Lindell Spar I, NP  lisinopril (PRINIVIL,ZESTRIL) 5 MG tablet Take 1 tablet (5 mg total) by mouth daily. For high blood pressure 12/07/17   Nwoko, Herbert Pun I, NP  magnesium oxide (MAG-OX) 400 (241.3 Mg) MG tablet Take 1 tablet (400 mg total) by mouth daily. For acid rflux 12/07/17   Lindell Spar I, NP  Melatonin 3 MG TABS Take 1 tablet (3 mg total) by mouth at bedtime. For sleep 12/06/17   Lindell Spar I, NP  metoprolol tartrate (LOPRESSOR) 25 MG tablet Take 1 tablet (25 mg total) by mouth 2 (two) times daily. For high blood pressure 12/06/17   Nwoko, Herbert Pun I, NP  ondansetron (ZOFRAN) 4 MG tablet Take 1 tablet (4 mg total) by mouth every 8 (eight) hours as needed for nausea or vomiting. 12/09/17   Mortis, Alvie Heidelberg I, PA-C  prazosin (MINIPRESS) 1 MG capsule Take 1 capsule (1 mg total) by mouth at bedtime. For nightmares 12/06/17   Lindell Spar I, NP  QUEtiapine (SEROQUEL) 100 MG tablet Take 1  tablet (100 mg total) by mouth daily. For mood control 12/06/17   Lindell Spar I, NP  QUEtiapine (SEROQUEL) 200 MG tablet Take 2.5 tablets (500 mg total) by mouth at bedtime. For mood control 12/06/17   Lindell Spar I, NP  sertraline (ZOLOFT) 100 MG tablet Take 2 tablets (200 mg total) by mouth daily. Fordepression 12/07/17   Lindell Spar I, NP  sulfamethoxazole-trimethoprim (BACTRIM,SEPTRA) 400-80 MG tablet Take 1 tablet by mouth every 12 (twelve) hours. For uti 12/06/17   Lindell Spar I, NP  topiramate (  TOPAMAX) 100 MG tablet Take 1 tablet (100 mg total) by mouth at bedtime. For mood stabilization 12/06/17   Encarnacion Slates, NP    Family History Family History  Problem Relation Age of Onset  . Coronary artery disease Maternal Grandmother   . Diabetes type II Maternal Grandmother   . Cancer Maternal Grandmother   . Hypertension Mother   . Hypertension Father     Social History Social History   Tobacco Use  . Smoking status: Never Smoker  . Smokeless tobacco: Never Used  Substance Use Topics  . Alcohol use: No  . Drug use: No     Allergies   Fish-derived products; Geodon [ziprasidone hcl]; Haldol [haloperidol lactate]; Buprenorphine hcl; Compazine [prochlorperazine]; Morphine and related; and Toradol [ketorolac tromethamine]   Review of Systems Review of Systems  All other systems reviewed and are negative.    Physical Exam Updated Vital Signs BP 128/82   Pulse 85   Temp 98.4 F (36.9 C) (Oral)   Resp 16   LMP 12/12/2017   SpO2 98%   Physical Exam  Constitutional: She is oriented to person, place, and time. She appears well-developed. No distress.  Obese, nontoxic  HENT:  Head: Normocephalic and atraumatic.  Right Ear: External ear normal.  Left Ear: External ear normal.  Somewhat dry oral mucous membranes.  Eyes: Pupils are equal, round, and reactive to light. Conjunctivae and EOM are normal.  Neck: Normal range of motion and phonation normal. Neck supple.    Cardiovascular: Normal rate, regular rhythm and normal heart sounds.  Pulmonary/Chest: Effort normal and breath sounds normal. She exhibits no bony tenderness.  Abdominal: Soft. She exhibits no distension. There is no tenderness. There is no guarding.  Musculoskeletal: Normal range of motion.  Neurological: She is alert and oriented to person, place, and time. No cranial nerve deficit or sensory deficit. She exhibits normal muscle tone. Coordination normal.  Skin: Skin is warm, dry and intact.  Psychiatric: She has a normal mood and affect. Her behavior is normal. Judgment and thought content normal.  Nursing note and vitals reviewed.    ED Treatments / Results  Labs (all labs ordered are listed, but only abnormal results are displayed) Labs Reviewed  COMPREHENSIVE METABOLIC PANEL - Abnormal; Notable for the following components:      Result Value   Sodium 128 (*)    Chloride 97 (*)    CO2 17 (*)    Glucose, Bld 623 (*)    Calcium 8.7 (*)    Albumin 3.4 (*)    AST 42 (*)    All other components within normal limits  CBC WITH DIFFERENTIAL/PLATELET - Abnormal; Notable for the following components:   WBC 3.4 (*)    Hemoglobin 11.2 (*)    MCH 25.5 (*)    Platelets 92 (*)    All other components within normal limits  URINALYSIS, ROUTINE W REFLEX MICROSCOPIC - Abnormal; Notable for the following components:   Color, Urine STRAW (*)    Specific Gravity, Urine 1.035 (*)    Glucose, UA >=500 (*)    Hgb urine dipstick LARGE (*)    Leukocytes, UA TRACE (*)    Bacteria, UA RARE (*)    All other components within normal limits  CBG MONITORING, ED - Abnormal; Notable for the following components:   Glucose-Capillary >600 (*)    All other components within normal limits  I-STAT VENOUS BLOOD GAS, ED - Abnormal; Notable for the following components:   pCO2, [place] Fiscal  39.0 (*)    pO2, Ven 51.0 (*)    Acid-base deficit 5.0 (*)    All other components within normal limits  CBG MONITORING, ED -  Abnormal; Notable for the following components:   Glucose-Capillary 442 (*)    All other components within normal limits  CBG MONITORING, ED - Abnormal; Notable for the following components:   Glucose-Capillary 327 (*)    All other components within normal limits  CBG MONITORING, ED - Abnormal; Notable for the following components:   Glucose-Capillary 271 (*)    All other components within normal limits  LIPASE, BLOOD  PREGNANCY, URINE  BLOOD GAS, VENOUS    EKG EKG Interpretation  Date/Time:  Sunday December 16 2017 15:00:51 EST Ventricular Rate:  105 PR Interval:    QRS Duration: 88 QT Interval:  361 QTC Calculation: 478 R Axis:   -26 Text Interpretation:  Sinus tachycardia Abnormal Q suggests anterior infarct Inferior infarct, old since last tracing no significant change Confirmed by Daleen Bo [phone]) on 12/16/2017 4:05:20 PM   Radiology No results found.  Procedures .Critical Care Performed by: Daleen Bo, MD Authorized by: Daleen Bo, MD   Critical care provider statement:    Critical care time (minutes):  35   Critical care start time:  12/16/2017 3:05 PM   Critical care end time:  12/16/2017 7:02 PM   Critical care time was exclusive of:  Separately billable procedures and treating other patients   Critical care was necessary to treat or prevent imminent or life-threatening deterioration of the following conditions:  Metabolic crisis   Critical care was time spent personally by me on the following activities:  Blood draw for specimens, development of treatment plan with patient or surrogate, discussions with consultants, evaluation of patient's response to treatment, examination of patient, obtaining history from patient or surrogate, ordering and performing treatments and interventions, ordering and review of laboratory studies, pulse oximetry, re-evaluation of patient's condition, review of old charts and ordering and review of radiographic studies    (including critical care time)  Medications Ordered in ED Medications  dextrose 5 %-0.45 % sodium chloride infusion ( Intravenous Hold 12/16/17 1553)  insulin regular, human (MYXREDLIN) 100 units/ 100 mL infusion (4.2 Units/hr Intravenous Rate/Dose Change 12/16/17 1852)  sodium chloride 0.9 % bolus 3,000 mL (0 mLs Intravenous Stopped 12/16/17 1738)     Initial Impression / Assessment and Plan / ED Course  I have reviewed the triage vital signs and the nursing notes.  Pertinent labs & imaging results that were available during my care of the patient were reviewed by me and considered in my medical decision making (see chart for details).  Clinical Course as of Dec 17 1902  Nancy Fetter Dec 16, 2017  1903 Improving  CBG monitoring, ED(!) [EW]  1903 Normal except elevated specific gravity, glucose and hemoglobin.  Urinalysis, Routine w reflex microscopic(!) [EW]  1903 Normal venous blood gas  I-Stat venous blood gas, ED(!) [EW]  1904 Normal  Pregnancy, urine [EW]  1904 Normal except hemoglobin low  CBC with Differential(!) [EW]  1904 Normal except sodium low, chloride low, CO2 low, glucose high, albumin low, AST high.  Anion gap is normal.  Comprehensive metabolic panel(!!) [EW]    Clinical Course User Index [EW] Daleen Bo, MD     Patient Vitals for the past 24 hrs:  BP Temp Temp src Pulse Resp SpO2  12/16/17 1700 128/82 - - 85 16 98 %  12/16/17 1630 129/83 - - 81 18 98 %  12/16/17 1502 - - - - - 99 %  12/16/17 1500 134/77 98.4 F (36.9 C) Oral (!) 107 20 97 %    7:00 PM Reevaluation with update and discussion. After initial assessment and treatment, an updated evaluation reveals she states that she is now hungry and feeling better.  She would like to go home at this time.  Findings discussed with the patient and all questions were answered. Daleen Bo   Medical Decision Making: Hyperglycemia, etiology unclear, with normal anion gap.  There is no evidence for metabolic  instability or suggestion for impending vascular collapse.  Hyperglycemia is likely multifactorial in this patient.  She is feeling better and can be managed as an outpatient, at this time.  Nursing Notes Reviewed/ Care Coordinated Applicable Imaging Reviewed Interpretation of Laboratory Data incorporated into ED treatment  The patient appears reasonably screened and/or stabilized for discharge and I doubt any other medical condition or other [facility] requiring further screening, evaluation, or treatment in the ED at this time prior to discharge.  Plan: Home Medications-TU usual medications; Home Treatments-rest, increase oral water intake; return here if the recommended treatment, does not improve the symptoms; Recommended follow up-to be checkup 2 or 3 days    Final Clinical Impressions(s) / ED Diagnoses   Final diagnoses:  Hyperglycemia    ED Discharge Orders    None       Daleen Bo, MD 12/16/17 1904

## 2017-12-16 NOTE — ED Notes (Signed)
Pt wanting to know when she can be discharged, her ride is on the way to ED. Dr. Eulis Foster made aware.

## 2017-12-16 NOTE — ED Notes (Signed)
Pt. Asked to provide sample stated she just used the bathroom and is unable to give sample at this time.

## 2017-12-16 NOTE — Discharge Instructions (Addendum)
Your blood sugar is high today, for no specific reason.  To help this continue taking your medicines regularly.  Make sure that you are staying on a low carbohydrate diet.  Also try to 2 L of water every day to lower your blood sugar.  Make sure you follow-up with your doctor in a day or 2 for further evaluation and treatment.  Call him in the morning for an appointment.  Take all of your medication as prescribed.

## 2017-12-22 ENCOUNTER — Other Ambulatory Visit: Payer: Self-pay

## 2017-12-22 ENCOUNTER — Encounter (HOSPITAL_COMMUNITY): Payer: Self-pay | Admitting: Emergency Medicine

## 2017-12-22 ENCOUNTER — Emergency Department (HOSPITAL_COMMUNITY)
Admission: EM | Admit: 2017-12-22 | Discharge: 2017-12-22 | Disposition: A | Discharge status: Home / Self Care | Payer: Medicare HMO | Attending: Emergency Medicine | Admitting: Emergency Medicine

## 2017-12-22 DIAGNOSIS — G40909 Epilepsy, unspecified, not intractable, without status epilepticus: Secondary | ICD-10-CM | POA: Diagnosis not present

## 2017-12-22 DIAGNOSIS — E1165 Type 2 diabetes mellitus with hyperglycemia: Secondary | ICD-10-CM | POA: Diagnosis not present

## 2017-12-22 DIAGNOSIS — J45909 Unspecified asthma, uncomplicated: Secondary | ICD-10-CM | POA: Diagnosis not present

## 2017-12-22 DIAGNOSIS — Z85028 Personal history of other malignant neoplasm of stomach: Secondary | ICD-10-CM | POA: Diagnosis not present

## 2017-12-22 DIAGNOSIS — R739 Hyperglycemia, unspecified: Secondary | ICD-10-CM

## 2017-12-22 DIAGNOSIS — Z9114 Patient's other noncompliance with medication regimen: Secondary | ICD-10-CM | POA: Insufficient documentation

## 2017-12-22 DIAGNOSIS — R569 Unspecified convulsions: Secondary | ICD-10-CM | POA: Diagnosis not present

## 2017-12-22 DIAGNOSIS — I10 Essential (primary) hypertension: Secondary | ICD-10-CM | POA: Insufficient documentation

## 2017-12-22 LAB — I-STAT BETA HCG BLOOD, ED (MC, WL, AP ONLY)

## 2017-12-22 LAB — CBC WITH DIFFERENTIAL/PLATELET
ABS IMMATURE GRANULOCYTES: 0.01 10*3/uL (ref 0.00–0.07)
Basophils Absolute: 0 10*3/uL (ref 0.0–0.1)
Basophils Relative: 0 %
EOS ABS: 0.1 10*3/uL (ref 0.0–0.5)
Eosinophils Relative: 3 %
HCT: 36.5 % (ref 36.0–46.0)
Hemoglobin: 11.4 g/dL — ABNORMAL LOW (ref 12.0–15.0)
IMMATURE GRANULOCYTES: 0 %
Lymphocytes Relative: 28 %
Lymphs Abs: 0.9 10*3/uL (ref 0.7–4.0)
MCH: 25.7 pg — AB (ref 26.0–34.0)
MCHC: 31.2 g/dL (ref 30.0–36.0)
MCV: 82.2 fL (ref 80.0–100.0)
MONO ABS: 0.2 10*3/uL (ref 0.1–1.0)
Monocytes Relative: 6 %
NEUTROS ABS: 2.1 10*3/uL (ref 1.7–7.7)
NEUTROS PCT: 63 %
PLATELETS: 102 10*3/uL — AB (ref 150–400)
RBC: 4.44 MIL/uL (ref 3.87–5.11)
RDW: 15.5 % (ref 11.5–15.5)
WBC: 3.3 10*3/uL — AB (ref 4.0–10.5)
nRBC: 0 % (ref 0.0–0.2)

## 2017-12-22 LAB — I-STAT VENOUS BLOOD GAS, ED
Acid-base deficit: 5 mmol/L — ABNORMAL HIGH (ref 0.0–2.0)
Bicarbonate: 19.5 mmol/L — ABNORMAL LOW (ref 20.0–28.0)
O2 SAT: 90 %
PCO2 VEN: 34.2 mmHg — AB (ref 44.0–60.0)
PH VEN: 7.364 (ref 7.250–7.430)
TCO2: 21 mmol/L — ABNORMAL LOW (ref 22–32)
pO2, Ven: 61 mmHg — ABNORMAL HIGH (ref 32.0–45.0)

## 2017-12-22 LAB — URINALYSIS, ROUTINE W REFLEX MICROSCOPIC
Bilirubin Urine: NEGATIVE
Glucose, UA: >=500 mg/dL — AB
Ketones, ur: NEGATIVE mg/dL
Nitrite: NEGATIVE
PH: 6 (ref 5.0–8.0)
Protein, ur: NEGATIVE mg/dL
SPECIFIC GRAVITY, URINE: 1.031 — AB (ref 1.005–1.030)

## 2017-12-22 LAB — COMPREHENSIVE METABOLIC PANEL
ALBUMIN: 3.6 g/dL (ref 3.5–5.0)
ALK PHOS: 115 U/L (ref 38–126)
ALT: 37 U/L (ref 0–44)
ANION GAP: 12 (ref 5–15)
AST: 49 U/L — ABNORMAL HIGH (ref 15–41)
BILIRUBIN TOTAL: 0.1 mg/dL — AB (ref 0.3–1.2)
BUN: <5 mg/dL — ABNORMAL LOW (ref 6–20)
CALCIUM: 8.6 mg/dL — AB (ref 8.9–10.3)
CO2: 20 mmol/L — ABNORMAL LOW (ref 22–32)
Chloride: 99 mmol/L (ref 98–111)
Creatinine, Ser: 0.63 mg/dL (ref 0.44–1.00)
GFR calc non Af Amer: >60 mL/min (ref >60)
GLUCOSE: 487 mg/dL — AB (ref 70–99)
POTASSIUM: 4.1 mmol/L (ref 3.5–5.1)
Sodium: 131 mmol/L — ABNORMAL LOW (ref 135–145)
TOTAL PROTEIN: 6.9 g/dL (ref 6.5–8.1)

## 2017-12-22 LAB — PHOSPHORUS: PHOSPHORUS: 4.5 mg/dL (ref 2.5–4.6)

## 2017-12-22 LAB — CBG MONITORING, ED: Glucose-Capillary: 483 mg/dL — ABNORMAL HIGH (ref 70–99)

## 2017-12-22 LAB — ACETAMINOPHEN LEVEL

## 2017-12-22 LAB — MAGNESIUM: MAGNESIUM: 1.7 mg/dL (ref 1.7–2.4)

## 2017-12-22 LAB — SALICYLATE LEVEL

## 2017-12-22 MED ORDER — INSULIN ASPART 100 UNIT/ML ~~LOC~~ SOLN
10.0000 [IU] | Freq: Once | SUBCUTANEOUS | Status: AC
  Administered 2017-12-22: 10 [IU] via INTRAVENOUS

## 2017-12-22 MED ORDER — ACETAMINOPHEN 500 MG PO TABS
1000.0000 mg | ORAL_TABLET | Freq: Once | ORAL | Status: AC
  Administered 2017-12-22: 1000 mg via ORAL
  Filled 2017-12-22: qty 2

## 2017-12-22 MED ORDER — LEVETIRACETAM IN NACL 1000 MG/100ML IV SOLN
1000.0000 mg | Freq: Once | INTRAVENOUS | Status: AC
  Administered 2017-12-22: 1000 mg via INTRAVENOUS
  Filled 2017-12-22: qty 100

## 2017-12-22 MED ORDER — LACTATED RINGERS IV BOLUS
1000.0000 mL | Freq: Once | INTRAVENOUS | Status: AC
  Administered 2017-12-22: 1000 mL via INTRAVENOUS

## 2017-12-22 NOTE — ED Provider Notes (Signed)
Emergency Department Provider Note   I have reviewed the triage vital signs and the nursing notes.   HISTORY  Chief Complaint Seizures   HPI Tina Saunders is a 32 y.o. female with medical problems as documented below who presents the emergency department basically for noncompliance.  Patient states that she does take her medicine because she "forgets".  She states that she think she had 5 seizures today but is unsure why she think she has seizures.  Says she just wakes up on the floor.  Not having significant discomfort at this time.  She also has not taken her insulin.  EMS called brought here for evaluation.  As soon as the patient arrived she asked to speak to social worker she was replaced in assisted living. No other associated or modifying symptoms.    Past Medical History:  Diagnosis Date  . Anxiety   . Asthma   . Bipolar 1 disorder ([place])   . Cancer of abdominal wall   . Depression   . Diabetes mellitus without complication ([place])   . High cholesterol   . Hypertension   . Intentional drug overdose ([place]) 07/26/2017   Seroquel and buspirone   . Obesity   . Obesity   . Polycystic ovarian syndrome 07/01/2011   Patient report  . 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 Schizophrenia ([place])   . Seizures [facility])     Patient Active Problem List   Diagnosis Date Noted  . MDD (major depressive disorder), recurrent severe, without psychosis ([place]) 11/29/2017  . 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 Adjustment disorder with mixed disturbance of emotions and conduct   . QT prolongation   . Suicide attempt ([place])   . Overdose of antidepressant, intentional self-harm, initial encounter ([place])   . Overdose 07/26/2017  . Suicide attempt by other psychotropic drug overdose ([place])   . DKA (diabetic ketoacidoses) ([place]) 03/14/2017  . Chronic constipation   . Seizures ([place]) 12/11/2016  . Diabetes mellitus type 2 in obese ([place]) 12/11/2016  . Uncontrolled diabetes  mellitus ([place]) 11/03/2016  . Schizoaffective disorder, bipolar type ([place]) 11/02/2016  . Cluster B personality disorder ([place]) 11/02/2016  . Suicidal ideation   . 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 HTN (hypertension) 04/15/2013  . Posttraumatic stress disorder 12/07/2011  . PSVT (paroxysmal supraventricular tachycardia) ([place]) 08/26/2011  . ADHD 09/23/2007  . EPIGASTRIC PAIN 09/23/2007  . Obesity, unspecified 07/30/2007  . Schizoaffective disorder, depressive type ([place]) 07/30/2007  . SLEEP DISORDER 07/30/2007  . METRORRHAGIA 06/13/2006  . DISORDER, MENSTRUAL NEC 06/13/2006  . POLYCYSTIC OVARIAN DISEASE 04/25/2006  . AMENORRHEA, SECONDARY 04/20/2006  . ACNE, MILD 04/20/2006    Past Surgical History:  Procedure Laterality Date  . CHOLECYSTECTOMY    . COLONOSCOPY WITH PROPOFOL N/A 03/08/2017   Procedure: COLONOSCOPY WITH PROPOFOL;  Surgeon: Milus Banister, MD;  Location: [facility];  Service: Endoscopy;  Laterality: N/A;  . HERNIA REPAIR    . Ovarian Cyst Excision    . VARICOSE VEIN SURGERY      Current Outpatient Rx  . Order #: [id number] Class: No Print  . Order #: [id number] Class: No Print  . Order #: [id number] Class: Print  . Order #: [id number] Class: Print  . Order #: [id number] Class: Print  . Order #: [id number] Class: Print  . Order #: [id number] Class: Print  . Order #: [id number] Class: No Print  . Order #: [id number] Class: No Print  . Order #: [id number] Class: No Print  . Order #: [id number] Class: No Print  .  Order #: [id number] Class: No Print  . Order #: [id number] Class: Print  . Order #: [id number] Class: No Print  . Order #: [id number] Class: Print  . Order #: [id number] Class: Print  . Order #: [id number] Class: Print  . Order #: [id number] Class: Print  . Order #: [id number] Class: Print  . Order #: [id number] Class: Print  . Order #: [id number] Class: Print    Allergies Fish-derived products; Geodon [ziprasidone hcl]; Haldol [haloperidol lactate]; Buprenorphine hcl; Compazine  [prochlorperazine]; Morphine and related; and Toradol [ketorolac tromethamine]  Family History  Problem Relation Age of Onset  . Coronary artery disease Maternal Grandmother   . Diabetes type II Maternal Grandmother   . Cancer Maternal Grandmother   . Hypertension Mother   . Hypertension Father     Social History Social History   Tobacco Use  . Smoking status: Never Smoker  . Smokeless tobacco: Never Used  Substance Use Topics  . Alcohol use: No  . Drug use: No    Review of Systems  All other systems negative except as documented in the HPI. All pertinent positives and negatives as reviewed in the HPI. ____________________________________________   PHYSICAL EXAM:  VITAL SIGNS: Blood pressure 127/82, pulse 88, resp. rate 18, weight 97 kg, last menstrual period 12/12/2017, SpO2 95 %.   Constitutional: Alert and oriented. Well appearing and in no acute distress. Eyes: Conjunctivae are normal. PERRL. EOMI. Head: Atraumatic. Nose: No congestion/rhinnorhea. Mouth/Throat: Mucous membranes are moist.  Oropharynx non-erythematous. Neck: No stridor.  No meningeal signs.   Cardiovascular: Normal rate, regular rhythm. Good peripheral circulation. Grossly normal heart sounds.   Respiratory: Normal respiratory effort.  No retractions. Lungs CTAB. Gastrointestinal: Soft and nontender. No distention.  Musculoskeletal: No lower extremity tenderness nor edema. No gross deformities of extremities. Neurologic:  Normal speech and language. No gross focal neurologic deficits are appreciated.  Skin:  Skin is warm, dry and intact. No rash noted.   ____________________________________________   LABS (all labs ordered are listed, but only abnormal results are displayed)  Labs Reviewed  CBC WITH DIFFERENTIAL/PLATELET - Abnormal; Notable for the following components:      Result Value   WBC 3.3 (*)    Hemoglobin 11.4 (*)    MCH 25.7 (*)    Platelets 102 (*)    All other components  within normal limits  COMPREHENSIVE METABOLIC PANEL - Abnormal; Notable for the following components:   Sodium 131 (*)    CO2 20 (*)    Glucose, Bld 487 (*)    BUN <5 (*)    Calcium 8.6 (*)    AST 49 (*)    Total Bilirubin 0.1 (*)    All other components within normal limits  ACETAMINOPHEN LEVEL - Abnormal; Notable for the following components:   Acetaminophen (Tylenol), Serum <10 (*)    All other components within normal limits  URINALYSIS, ROUTINE W REFLEX MICROSCOPIC - Abnormal; Notable for the following components:   Specific Gravity, Urine 1.031 (*)    Glucose, UA >=500 (*)    Hgb urine dipstick SMALL (*)    Leukocytes, UA SMALL (*)    Bacteria, UA RARE (*)    All other components within normal limits  CBG MONITORING, ED - Abnormal; Notable for the following components:   Glucose-Capillary 483 (*)    All other components within normal limits  I-STAT VENOUS BLOOD GAS, ED - Abnormal; Notable for the following components:   pCO2, Ven 34.2 (*)    pO2, Ven 61.0 (*)    Bicarbonate 19.5 (*)  TCO2 21 (*)    Acid-base deficit 5.0 (*)    All other components within normal limits  MAGNESIUM  PHOSPHORUS  SALICYLATE LEVEL  LEVETIRACETAM LEVEL  BLOOD GAS, VENOUS  I-STAT BETA HCG BLOOD, ED ([facility], [facility], [facility] ONLY)   ____________________________________________  EKG   EKG Interpretation  Date/Time:  Saturday December 22 2017 16:38:31 EST Ventricular Rate:  103 PR Interval:  166 QRS Duration: 84 QT Interval:  372 QTC Calculation: 487 R Axis:   5 Text Interpretation:  Sinus tachycardia Anteroseptal infarct , age undetermined Abnormal ECG No significant change since last tracing Confirmed by Merrily Pew [phone]) on 12/22/2017 6:00:06 PM       ____________________________________________  PROCEDURES  Procedure(s) performed:   Procedures  No critical care ____________________________________________   INITIAL IMPRESSION / ASSESSMENT AND PLAN / ED COURSE  Not DKA.   Keppra level sent.  Loaded her with Keppra afterwards.  Discussed with her sister-in-law and the patient and the best I can do at this time is to have case manager and social work consult in the morning and try to help get some home health resources and possible placement as she states that her primary doctor has already filled out an FL 2.  No indication for admission at this time.     Pertinent labs & imaging results that were available during my care of the patient were reviewed by me and considered in my medical decision making (see chart for details).  ____________________________________________  FINAL CLINICAL IMPRESSION(S) / ED DIAGNOSES  Final diagnoses:  Seizure disorder ([place])  Hyperglycemia     MEDICATIONS GIVEN DURING THIS VISIT:  Medications  lactated ringers bolus 1,000 mL (0 mLs Intravenous Stopped 12/22/17 1909)  levETIRAcetam (KEPPRA) IVPB 1000 mg/100 mL premix (0 mg Intravenous Stopped 12/22/17 1846)  acetaminophen (TYLENOL) tablet 1,000 mg (1,000 mg Oral Given 12/22/17 1731)  insulin aspart (novoLOG) injection 10 Units (10 Units Intravenous Given 12/22/17 1906)     NEW OUTPATIENT MEDICATIONS STARTED DURING THIS VISIT:  Discharge Medication List as of 12/22/2017  6:56 PM      Note:  This note was prepared with assistance of Dragon voice recognition software. Occasional wrong-word or sound-a-like substitutions may have occurred due to the inherent limitations of voice recognition software.   Merrily Pew, MD 12/22/17 [phone]

## 2017-12-22 NOTE — ED Notes (Signed)
Patient verbalizes understanding of discharge instructions. Opportunity for questioning and answers were provided. Armband removed by staff, pt discharged from ED.  

## 2017-12-22 NOTE — ED Triage Notes (Signed)
Pt states she had 5 seizures today, states she woke up on the ground uninjured. Pt CBG 516 per EMS. EMS states pt is not taking meds. Pt asking to go to an assisted living center and wants to speak to a case manager.

## 2017-12-23 LAB — LEVETIRACETAM LEVEL: LEVETIRACETAM: 6.1 ug/mL — AB (ref 10.0–40.0)

## 2017-12-24 DIAGNOSIS — I10 Essential (primary) hypertension: Secondary | ICD-10-CM | POA: Diagnosis not present

## 2017-12-24 DIAGNOSIS — Z23 Encounter for immunization: Secondary | ICD-10-CM | POA: Diagnosis not present

## 2017-12-24 DIAGNOSIS — E1165 Type 2 diabetes mellitus with hyperglycemia: Secondary | ICD-10-CM | POA: Diagnosis not present

## 2017-12-24 DIAGNOSIS — F319 Bipolar disorder, unspecified: Secondary | ICD-10-CM | POA: Diagnosis not present

## 2017-12-24 DIAGNOSIS — R569 Unspecified convulsions: Secondary | ICD-10-CM | POA: Diagnosis not present

## 2017-12-24 NOTE — Care Management Note (Signed)
Case Management Note  Patient Details  Name: Tina Saunders MRN: [id number] Date of Birth: 10/24/1985  CM consulted for [facility] services with ALF placement in the community.  CM spoke with pt via phone who advised she has already seen her PCP today and they have completed an FL2.  She requested to use [facility]. CM discussed medicare ratings with pt.    CM contacted Georgina Snell with [facility] who accepted pt for services and is aware of pt's needs.  He reports they will attempt to see pt tomorrow since she was D/C on 12/22/2017.    Updated Dr. Dayna Barker via messages.  No further CM needs noted at this time.  Expected Discharge Date:    12/22/2017              Expected Discharge Plan:  [place]  Discharge planning Services  CM Consult  Post Acute Care Choice:  Home Health Choice offered to:  Patient  HH Arranged:  RN, Nurse's Aide, Social Work [facility] Agency:  [place] of Service:  Completed, signed off  Luverna Degenhart, Benjaman Lobe, RN 12/24/2017, 4:54 PM

## 2017-12-25 DIAGNOSIS — J45909 Unspecified asthma, uncomplicated: Secondary | ICD-10-CM | POA: Diagnosis not present

## 2017-12-25 DIAGNOSIS — F419 Anxiety disorder, unspecified: Secondary | ICD-10-CM | POA: Diagnosis not present

## 2017-12-25 DIAGNOSIS — G40909 Epilepsy, unspecified, not intractable, without status epilepticus: Secondary | ICD-10-CM | POA: Diagnosis not present

## 2017-12-25 DIAGNOSIS — C494 Malignant neoplasm of connective and soft tissue of abdomen: Secondary | ICD-10-CM | POA: Diagnosis not present

## 2017-12-25 DIAGNOSIS — E1165 Type 2 diabetes mellitus with hyperglycemia: Secondary | ICD-10-CM | POA: Diagnosis not present

## 2017-12-25 DIAGNOSIS — F250 Schizoaffective disorder, bipolar type: Secondary | ICD-10-CM | POA: Diagnosis not present

## 2017-12-26 DIAGNOSIS — C494 Malignant neoplasm of connective and soft tissue of abdomen: Secondary | ICD-10-CM | POA: Diagnosis not present

## 2017-12-26 DIAGNOSIS — E1165 Type 2 diabetes mellitus with hyperglycemia: Secondary | ICD-10-CM | POA: Diagnosis not present

## 2017-12-26 DIAGNOSIS — F419 Anxiety disorder, unspecified: Secondary | ICD-10-CM | POA: Diagnosis not present

## 2017-12-26 DIAGNOSIS — J45909 Unspecified asthma, uncomplicated: Secondary | ICD-10-CM | POA: Diagnosis not present

## 2017-12-26 DIAGNOSIS — G40909 Epilepsy, unspecified, not intractable, without status epilepticus: Secondary | ICD-10-CM | POA: Diagnosis not present

## 2017-12-26 DIAGNOSIS — F250 Schizoaffective disorder, bipolar type: Secondary | ICD-10-CM | POA: Diagnosis not present

## 2018-01-01 DIAGNOSIS — C494 Malignant neoplasm of connective and soft tissue of abdomen: Secondary | ICD-10-CM | POA: Diagnosis not present

## 2018-01-01 DIAGNOSIS — F419 Anxiety disorder, unspecified: Secondary | ICD-10-CM | POA: Diagnosis not present

## 2018-01-01 DIAGNOSIS — J45909 Unspecified asthma, uncomplicated: Secondary | ICD-10-CM | POA: Diagnosis not present

## 2018-01-01 DIAGNOSIS — E1165 Type 2 diabetes mellitus with hyperglycemia: Secondary | ICD-10-CM | POA: Diagnosis not present

## 2018-01-01 DIAGNOSIS — F250 Schizoaffective disorder, bipolar type: Secondary | ICD-10-CM | POA: Diagnosis not present

## 2018-01-01 DIAGNOSIS — G40909 Epilepsy, unspecified, not intractable, without status epilepticus: Secondary | ICD-10-CM | POA: Diagnosis not present

## 2018-01-02 ENCOUNTER — Encounter (HOSPITAL_COMMUNITY): Payer: Self-pay | Admitting: *Deleted

## 2018-01-02 ENCOUNTER — Emergency Department (HOSPITAL_COMMUNITY)
Admission: EM | Admit: 2018-01-02 | Discharge: 2018-01-02 | Disposition: A | Discharge status: Home / Self Care | Payer: Medicare HMO | Attending: Emergency Medicine | Admitting: Emergency Medicine

## 2018-01-02 ENCOUNTER — Other Ambulatory Visit: Payer: Self-pay

## 2018-01-02 ENCOUNTER — Emergency Department (HOSPITAL_COMMUNITY): Payer: Medicare HMO

## 2018-01-02 DIAGNOSIS — K760 Fatty (change of) liver, not elsewhere classified: Secondary | ICD-10-CM | POA: Diagnosis not present

## 2018-01-02 DIAGNOSIS — R319 Hematuria, unspecified: Secondary | ICD-10-CM | POA: Diagnosis not present

## 2018-01-02 DIAGNOSIS — G40909 Epilepsy, unspecified, not intractable, without status epilepticus: Secondary | ICD-10-CM | POA: Diagnosis not present

## 2018-01-02 DIAGNOSIS — R1084 Generalized abdominal pain: Secondary | ICD-10-CM | POA: Diagnosis not present

## 2018-01-02 DIAGNOSIS — R1031 Right lower quadrant pain: Secondary | ICD-10-CM

## 2018-01-02 DIAGNOSIS — J45909 Unspecified asthma, uncomplicated: Secondary | ICD-10-CM | POA: Diagnosis not present

## 2018-01-02 DIAGNOSIS — F419 Anxiety disorder, unspecified: Secondary | ICD-10-CM | POA: Diagnosis not present

## 2018-01-02 DIAGNOSIS — R161 Splenomegaly, not elsewhere classified: Secondary | ICD-10-CM | POA: Diagnosis not present

## 2018-01-02 DIAGNOSIS — F250 Schizoaffective disorder, bipolar type: Secondary | ICD-10-CM | POA: Diagnosis not present

## 2018-01-02 DIAGNOSIS — R52 Pain, unspecified: Secondary | ICD-10-CM | POA: Diagnosis not present

## 2018-01-02 DIAGNOSIS — Z794 Long term (current) use of insulin: Secondary | ICD-10-CM | POA: Diagnosis not present

## 2018-01-02 DIAGNOSIS — B373 Candidiasis of vulva and vagina: Secondary | ICD-10-CM | POA: Insufficient documentation

## 2018-01-02 DIAGNOSIS — C494 Malignant neoplasm of connective and soft tissue of abdomen: Secondary | ICD-10-CM | POA: Diagnosis not present

## 2018-01-02 DIAGNOSIS — I10 Essential (primary) hypertension: Secondary | ICD-10-CM | POA: Insufficient documentation

## 2018-01-02 DIAGNOSIS — R739 Hyperglycemia, unspecified: Secondary | ICD-10-CM

## 2018-01-02 DIAGNOSIS — E1165 Type 2 diabetes mellitus with hyperglycemia: Secondary | ICD-10-CM | POA: Diagnosis not present

## 2018-01-02 DIAGNOSIS — R110 Nausea: Secondary | ICD-10-CM | POA: Diagnosis not present

## 2018-01-02 DIAGNOSIS — B3731 Acute candidiasis of vulva and vagina: Secondary | ICD-10-CM

## 2018-01-02 LAB — WET PREP, GENITAL
Sperm: NONE SEEN
Trich, Wet Prep: NONE SEEN

## 2018-01-02 LAB — URINALYSIS, ROUTINE W REFLEX MICROSCOPIC
Bacteria, UA: NONE SEEN
Bilirubin Urine: NEGATIVE
Glucose, UA: >=500 mg/dL — AB
Ketones, ur: NEGATIVE mg/dL
NITRITE: NEGATIVE
Protein, ur: NEGATIVE mg/dL
Specific Gravity, Urine: 1.03 (ref 1.005–1.030)
pH: 6 (ref 5.0–8.0)

## 2018-01-02 LAB — COMPREHENSIVE METABOLIC PANEL
ALK PHOS: 104 U/L (ref 38–126)
ALT: 24 U/L (ref 0–44)
AST: 36 U/L (ref 15–41)
Albumin: 3.5 g/dL (ref 3.5–5.0)
Anion gap: 15 (ref 5–15)
BUN: 11 mg/dL (ref 6–20)
CO2: 20 mmol/L — ABNORMAL LOW (ref 22–32)
Calcium: 8.9 mg/dL (ref 8.9–10.3)
Chloride: 98 mmol/L (ref 98–111)
Creatinine, Ser: 0.5 mg/dL (ref 0.44–1.00)
GFR calc Af Amer: >60 mL/min (ref >60)
GFR calc non Af Amer: >60 mL/min (ref >60)
Glucose, Bld: 424 mg/dL — ABNORMAL HIGH (ref 70–99)
Potassium: 4.2 mmol/L (ref 3.5–5.1)
Sodium: 133 mmol/L — ABNORMAL LOW (ref 135–145)
Total Bilirubin: 0.5 mg/dL (ref 0.3–1.2)
Total Protein: 6.8 g/dL (ref 6.5–8.1)

## 2018-01-02 LAB — CBC
HCT: 36.4 % (ref 36.0–46.0)
Hemoglobin: 11 g/dL — ABNORMAL LOW (ref 12.0–15.0)
MCH: 24.8 pg — ABNORMAL LOW (ref 26.0–34.0)
MCHC: 30.2 g/dL (ref 30.0–36.0)
MCV: 82 fL (ref 80.0–100.0)
PLATELETS: 124 10*3/uL — AB (ref 150–400)
RBC: 4.44 MIL/uL (ref 3.87–5.11)
RDW: 15 % (ref 11.5–15.5)
WBC: 4.4 10*3/uL (ref 4.0–10.5)
nRBC: 0 % (ref 0.0–0.2)

## 2018-01-02 LAB — I-STAT BETA HCG BLOOD, ED (MC, WL, AP ONLY): I-stat hCG, quantitative: <5 m[IU]/mL (ref <5)

## 2018-01-02 LAB — LIPASE, BLOOD: Lipase: 48 U/L (ref 11–51)

## 2018-01-02 MED ORDER — DICYCLOMINE HCL 10 MG/ML IM SOLN
20.0000 mg | Freq: Once | INTRAMUSCULAR | Status: AC
  Administered 2018-01-02: 20 mg via INTRAMUSCULAR
  Filled 2018-01-02: qty 2

## 2018-01-02 MED ORDER — MICONAZOLE NITRATE 200 MG VA SUPP: 200.0000 mg | Freq: Every day | VAGINAL | 0 refills | Status: DC

## 2018-01-02 MED ORDER — LORAZEPAM 2 MG/ML IJ SOLN
0.5000 mg | Freq: Once | INTRAMUSCULAR | Status: AC
  Administered 2018-01-02: 0.5 mg via INTRAVENOUS
  Filled 2018-01-02: qty 1

## 2018-01-02 MED ORDER — IOHEXOL 300 MG/ML  SOLN
100.0000 mL | Freq: Once | INTRAMUSCULAR | Status: AC | PRN
  Administered 2018-01-02: 100 mL via INTRAVENOUS

## 2018-01-02 MED ORDER — SODIUM CHLORIDE 0.9 % IV BOLUS
1000.0000 mL | Freq: Once | INTRAVENOUS | Status: AC
  Administered 2018-01-02: 1000 mL via INTRAVENOUS

## 2018-01-02 NOTE — Discharge Instructions (Addendum)
You were seen in the emergency department today for abdominal pain.  Your lab work was generally reassuring.  Your blood sugar was very high, it is essential that you monitor this at home and take your diabetic medications as prescribed.  Please follow-up with your primary care provider for recheck of this. You  Also had some blood in your urine as well as some incidental findings on your CT scan including fatty liver and enlarged spleen, please discuss this with your primary care provider as well.  Wet prep showed a yeast infection, we are giving you a vaginal suppository to use for the next 3 nights to help treat this- Miconazole.   We have prescribed you new medication(s) today. Discuss the medications prescribed today with your pharmacist as they can have adverse effects and interactions with your other medicines including over the counter and prescribed medications. Seek medical evaluation if you start to experience new or abnormal symptoms after taking one of these medicines, seek care immediately if you start to experience difficulty breathing, feeling of your throat closing, facial swelling, or rash as these could be indications of a more serious allergic reaction   Your CT scan did not show appendicitis.  We are sure the exact cause of your abdominal pain, please follow-up with your primary care provider.  Please return to the ER for new or worsening symptoms or any other concerns.

## 2018-01-02 NOTE — ED Notes (Signed)
Pt ambulatory to bathroom with no reported issues. 

## 2018-01-02 NOTE — ED Triage Notes (Signed)
Pt reports ABD started this AM. Pt reports her ABD pain is worse tonight.. Pt denies any N/V/D.

## 2018-01-02 NOTE — ED Notes (Signed)
Patient transported to Ct. 

## 2018-01-02 NOTE — ED Notes (Signed)
Discharge instructions and prescription discussed with Pt. Pt verbalized understanding. Pt stable and ambulatory.    

## 2018-01-02 NOTE — ED Provider Notes (Signed)
[facility] EMERGENCY DEPARTMENT Provider Note   CSN: [id number] Arrival date & time: 01/02/18  1834     History   Chief Complaint Chief Complaint  Patient presents with  . Abdominal Pain    HPI Tina Saunders is a 32 y.o. female with a history of PCOS, anxiety, asthma, bipolar 1 disorder, depression, type 2 diabetes mellitus, schizophrenia, seizures, hypertension, obesity, who is s/p cholecystectomy presents to the emergency department with complaints of abdominal pain which started this morning.  Patient states that the pain is located in the right lower quadrant, it is constant, an 8 out of 10 in severity, worse with ambulation, no alleviating factors.  Tried ibuprofen prior to arrival without relief.  She has had nausea without vomiting.  Denies fever, chills, diarrhea, constipation, melena, hematochezia, dysuria, vaginal bleeding, or vaginal discharge.  Not sexually active and does not have concern for STD.  Chart reviewed appears the patient has been seen for right lower quadrant abdominal pain in the past, she states this is different.  HPI  Past Medical History:  Diagnosis Date  . Anxiety   . Asthma   . Bipolar 1 disorder ([place])   . Cancer of abdominal wall   . Depression   . Diabetes mellitus without complication ([place])   . High cholesterol   . Hypertension   . Intentional drug overdose ([place]) 07/26/2017   Seroquel and buspirone   . Obesity   . Obesity   . Polycystic ovarian syndrome 07/01/2011   Patient report  . 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 Schizophrenia ([place])   . Seizures [facility])     Patient Active Problem List   Diagnosis Date Noted  . MDD (major depressive disorder), recurrent severe, without psychosis ([place]) 11/29/2017  . 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 Adjustment disorder with mixed disturbance of emotions and conduct   . QT prolongation   . Suicide attempt ([place])   . Overdose of antidepressant, intentional  self-harm, initial encounter ([place])   . Overdose 07/26/2017  . Suicide attempt by other psychotropic drug overdose ([place])   . DKA (diabetic ketoacidoses) ([place]) 03/14/2017  . Chronic constipation   . Seizures ([place]) 12/11/2016  . Diabetes mellitus type 2 in obese ([place]) 12/11/2016  . Uncontrolled diabetes mellitus ([place]) 11/03/2016  . Schizoaffective disorder, bipolar type ([place]) 11/02/2016  . Cluster B personality disorder ([place]) 11/02/2016  . Suicidal ideation   . 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 HTN (hypertension) 04/15/2013  . Posttraumatic stress disorder 12/07/2011  . PSVT (paroxysmal supraventricular tachycardia) ([place]) 08/26/2011  . ADHD 09/23/2007  . EPIGASTRIC PAIN 09/23/2007  . Obesity, unspecified 07/30/2007  . Schizoaffective disorder, depressive type ([place]) 07/30/2007  . SLEEP DISORDER 07/30/2007  . METRORRHAGIA 06/13/2006  . DISORDER, MENSTRUAL NEC 06/13/2006  . POLYCYSTIC OVARIAN DISEASE 04/25/2006  . AMENORRHEA, SECONDARY 04/20/2006  . ACNE, MILD 04/20/2006    Past Surgical History:  Procedure Laterality Date  . CHOLECYSTECTOMY    . COLONOSCOPY WITH PROPOFOL N/A 03/08/2017   Procedure: COLONOSCOPY WITH PROPOFOL;  Surgeon: Milus Banister, MD;  Location: [facility] ENDOSCOPY;  Service: Endoscopy;  Laterality: N/A;  . HERNIA REPAIR    . Ovarian Cyst Excision    . VARICOSE VEIN SURGERY       OB History    Gravida  0   Para      Term      Preterm      AB      Living  SAB      TAB      Ectopic      Multiple      Live Births               Home Medications    Prior to Admission medications   Medication Sig Start Date End Date Taking? Authorizing Provider  atorvastatin (LIPITOR) 40 MG tablet Take 1 tablet (40 mg total) by mouth at bedtime. For high cholesterol 12/06/17   Lindell Spar I, NP  bisacodyl (DULCOLAX) 5 MG EC tablet Take 2 tablets (10 mg total) by mouth daily as needed for moderate constipation. (May buy from over the counter): For  constipation 12/06/17   Lindell Spar I, NP  busPIRone (BUSPAR) 15 MG tablet Take 1 tablet (15 mg total) by mouth 3 (three) times daily. For anxiety 12/06/17   Lindell Spar I, NP  chlorproMAZINE (THORAZINE) 50 MG tablet Take 1 tablet (50 mg total) by mouth 3 (three) times daily. For mood control 12/06/17   Lindell Spar I, NP  dicyclomine (BENTYL) 20 MG tablet Take 1 tablet (20 mg total) by mouth 2 (two) times daily for 7 days. 12/09/17 12/16/17  Mortis, Alvie Heidelberg I, PA-C  gabapentin (NEURONTIN) 300 MG capsule Take 2 capsules (600 mg total) by mouth 3 (three) times daily. For agitation 12/06/17   Lindell Spar I, NP  insulin aspart (NOVOLOG) 100 UNIT/ML injection Inject 30 Units into the skin 3 (three) times daily with meals. For diabetes management 12/06/17   Lindell Spar I, NP  insulin detemir (LEVEMIR) 100 UNIT/ML injection Inject 0.7 mLs (70 Units total) into the skin 2 (two) times daily. For diabetes management 12/06/17   Lindell Spar I, NP  Insulin Syringe-Needle U-100 25G X 1" 1 ML MISC 1 Syringe by Does not apply route 4 (four) times daily -  before meals and at bedtime. For blood sugar checks 12/06/17   Lindell Spar I, NP  levETIRAcetam (KEPPRA) 500 MG tablet Take 1 tablet (500 mg total) by mouth 2 (two) times daily. For seizure activities 12/06/17   Lindell Spar I, NP  lisinopril (PRINIVIL,ZESTRIL) 5 MG tablet Take 1 tablet (5 mg total) by mouth daily. For high blood pressure 12/07/17   N[place]oko, Herbert Pun I, NP  magnesium oxide (MAG-OX) 400 (241.3 Mg) MG tablet Take 1 tablet (400 mg total) by mouth daily. For acid rflux 12/07/17   Lindell Spar I, NP  Melatonin 3 MG TABS Take 1 tablet (3 mg total) by mouth at bedtime. For sleep 12/06/17   Lindell Spar I, NP  metoprolol tartrate (LOPRESSOR) 25 MG tablet Take 1 tablet (25 mg total) by mouth 2 (two) times daily. For high blood pressure 12/06/17   [place], Herbert Pun I, NP  ondansetron (ZOFRAN) 4 MG tablet Take 1 tablet (4 mg total) by mouth every 8 (eight) hours  as needed for nausea or vomiting. 12/09/17   Mortis, Alvie Heidelberg I, PA-C  prazosin (MINIPRESS) 1 MG capsule Take 1 capsule (1 mg total) by mouth at bedtime. For nightmares 12/06/17   Lindell Spar I, NP  QUEtiapine (SEROQUEL) 100 MG tablet Take 1 tablet (100 mg total) by mouth daily. For mood control 12/06/17   Lindell Spar I, NP  QUEtiapine (SEROQUEL) 200 MG tablet Take 2.5 tablets (500 mg total) by mouth at bedtime. For mood control 12/06/17   Lindell Spar I, NP  sertraline (ZOLOFT) 100 MG tablet Take 2 tablets (200 mg total) by mouth daily. Fordepression 12/07/17   Encarnacion Slates, NP  sulfamethoxazole-trimethoprim (  BACTRIM,SEPTRA) 400-80 MG tablet Take 1 tablet by mouth every 12 (twelve) hours. For uti 12/06/17   Lindell Spar I, NP  topiramate (TOPAMAX) 100 MG tablet Take 1 tablet (100 mg total) by mouth at bedtime. For mood stabilization 12/06/17   Encarnacion Slates, NP    Family History Family History  Problem Relation Age of Onset  . Coronary artery disease Maternal Grandmother   . Diabetes type II Maternal Grandmother   . Cancer Maternal Grandmother   . Hypertension Mother   . Hypertension Father     Social History Social History   Tobacco Use  . Smoking status: Never Smoker  . Smokeless tobacco: Never Used  Substance Use Topics  . Alcohol use: No  . Drug use: No     Allergies   Fish-derived products; Geodon [ziprasidone hcl]; Haldol [haloperidol lactate]; Buprenorphine hcl; Compazine [prochlorperazine]; Morphine and related; and Toradol [ketorolac tromethamine]   Review of Systems Review of Systems  Constitutional: Negative for chills and fever.  Respiratory: Negative for shortness of breath.   Cardiovascular: Negative for chest pain.  Gastrointestinal: Positive for abdominal pain and nausea. Negative for blood in stool, constipation, diarrhea and vomiting.  Genitourinary: Negative for dysuria, vaginal bleeding and vaginal discharge.  All other systems reviewed and are  negative.    Physical Exam Updated Vital Signs BP 125/81 (BP Location: Right Arm)   Pulse (!) 103   Temp 98.1 F (36.7 C) (Oral)   Resp 18   Ht 5\' 4"  (1.626 m)   Wt 97 kg   LMP 12/12/2017   SpO2 98%   BMI 36.71 kg/m   Physical Exam  Constitutional: She appears well-developed and well-nourished.  Non-toxic appearance. No distress.  HENT:  Head: Normocephalic and atraumatic.  Eyes: Conjunctivae are normal. Right eye exhibits no discharge. Left eye exhibits no discharge.  Neck: Neck supple.  Cardiovascular: Normal rate and regular rhythm.  Pulmonary/Chest: Effort normal and breath sounds normal. No respiratory distress. She has no wheezes. She has no rhonchi. She has no rales.  Respiration even and unlabored  Abdominal: Soft. She exhibits no distension. There is tenderness in the right lower quadrant. There is no rigidity, no rebound, no guarding and no CVA tenderness.  Genitourinary: Pelvic exam was performed with patient supine. There is no rash, tenderness or lesion on the right labia. There is no rash, tenderness or lesion on the left labia. Cervix exhibits no motion tenderness and no friability. Right adnexum displays no mass, no tenderness and no fullness. Left adnexum displays no mass, no tenderness and no fullness. No erythema or bleeding in the vagina. No foreign body in the vagina. Vaginal discharge (Mild amount, white.) found.  Genitourinary Comments: EDT present as chaperone.  Neurological: She is alert.  Clear speech.   Skin: Skin is warm and dry. No rash noted.  Psychiatric: She has a normal mood and affect. Her behavior is normal.  Nursing note and vitals reviewed.    ED Treatments / Results  Labs (all labs ordered are listed, but only abnormal results are displayed) Labs Reviewed  COMPREHENSIVE METABOLIC PANEL - Abnormal; Notable for the following components:      Result Value   Sodium 133 (*)    CO2 20 (*)    Glucose, Bld 424 (*)    All other components  within normal limits  CBC - Abnormal; Notable for the following components:   Hemoglobin 11.0 (*)    MCH 24.8 (*)    Platelets 124 (*)  All other components within normal limits  URINALYSIS, ROUTINE W REFLEX MICROSCOPIC - Abnormal; Notable for the following components:   APPearance HAZY (*)    Glucose, UA >=500 (*)    Hgb urine dipstick MODERATE (*)    Leukocytes, UA SMALL (*)    All other components within normal limits  LIPASE, BLOOD  I-STAT BETA HCG BLOOD, ED ([facility], [facility], [facility] ONLY)    EKG None  Radiology Ct Abdomen Pelvis W Contrast  Result Date: 01/02/2018 CLINICAL DATA:  32 year old female with abdominal pain. EXAM: CT ABDOMEN AND PELVIS WITH CONTRAST TECHNIQUE: Multidetector CT imaging of the abdomen and pelvis was performed using the standard protocol following bolus administration of intravenous contrast. CONTRAST:  136mL OMNIPAQUE IOHEXOL 300 MG/ML  SOLN COMPARISON:  CT of the abdomen pelvis dated 12/09/2017 FINDINGS: Lower chest: Partially visualized trace right pleural effusion versus pleural thickening. The visualized lung bases are otherwise clear. No intra-abdominal free air or free fluid. Hepatobiliary: Diffuse fatty infiltration of the liver. The liver is enlarged measuring 21 cm in midclavicular length. Correlation with liver enzymes recommended. No intrahepatic biliary ductal dilatation. Cholecystectomy. Pancreas: Unremarkable. No pancreatic ductal dilatation or surrounding inflammatory changes. Spleen: Splenomegaly measuring 22 cm in craniocaudal length similar to prior CT. Adrenals/Urinary Tract: The adrenal glands are unremarkable. The kidneys, visualized ureters, and urinary bladder appear unremarkable. Stomach/Bowel: There is no bowel obstruction or active inflammation. Normal appendix. Vascular/Lymphatic: The abdominal aorta and IVC appear unremarkable. There is prominence of the splenic vein likely suggestive of portal hypertension. No portal venous gas. There is no  adenopathy. Reproductive: The uterus is anteverted. The ovaries are grossly unremarkable. No pelvic mass. Other: Anterior abdominal wall collateral veins noted. There is a midline vertical anterior pelvic wall incisional scar. Musculoskeletal: No acute or significant osseous findings. IMPRESSION: 1. No acute intra-abdominal or pelvic pathology. No bowel obstruction or active inflammation. Normal appendix. 2. Enlarged and fatty liver, likely steatohepatitis. Correlation with LFTs recommended. 3. Stable splenomegaly with findings suggestive of portal hypertension. Electronically Signed   By: Anner Crete M.D.   On: 01/02/2018 21:41    Procedures Procedures (including critical care time)  Medications Ordered in ED Medications  sodium chloride 0.9 % bolus 1,000 mL (1,000 mLs Intravenous New Bag/Given 01/02/18 2051)  LORazepam (ATIVAN) injection 0.5 mg (0.5 mg Intravenous Given 01/02/18 2052)  dicyclomine (BENTYL) injection 20 mg (20 mg Intramuscular Given 01/02/18 2051)  iohexol (OMNIPAQUE) 300 MG/ML solution 100 mL (100 mLs Intravenous Contrast Given 01/02/18 2110)     Initial Impression / Assessment and Plan / ED Course  I have reviewed the triage vital signs and the nursing notes.  Pertinent labs & imaging results that were available during my care of the patient were reviewed by me and considered in my medical decision making (see chart for details).    Patient presents to the ED with complaints of abdominal pain. Patient nontoxic appearing, in no apparent distress, vitals WNL upon my assessment- initial tachycardia normalized. On exam patient tender to RLQ, no peritoneal signs. Labs per triage reviewed.. No leukocytosis. Baseline anemia. No significant electrolyte derangements. LFTs, renal function, and lipase WNL. Hyperglycemia noted- seems to be consistently in the 400s on prior ER visits with hx of noncompliance, does not appear to be in DKA. Urinalysis without obvious infection- moderate  hgb will need PCP recheck. Wet prep w/ BV- no increased vaginal discharge so will not treat this, there is yeast likely secondary to hyperglycemia, will treat with miconazole vaginal suppository- diflucan avoided secondary  to hx of QTc prolongation. Without being sexually active and without pelvic exam tenderness low suspicion for PID/TOOA.   I had lengthy discussion with patient regarding imaging, given she has had repeat CT abdomen/pelvis exams over the past 6 months which have been negative, patient is insistent that this pain is different. We discussed the risks/benefits, and patient would like to have a CT scan performed. CT abdomen/pelvis obtained  No acute intra-abdominal or pelvic pathology. No bowel obstruction or active inflammation. Normal appendix. She does have enlarged and fatty liver, likely steatohepatitis. Correlation with LFTs recommended- WNL, however will need PCP follow up. Stable splenomegaly with findings suggestive of portal hypertension- again will need PCP follow up.   On repeat abdominal exam patient remains without peritoneal signs, doubt cholecystitis, pancreatitis, diverticulitis, appendicitis, bowel obstruction/perforation, PID, ovarian torsion, ectopic pregnancy. Patient tolerating PO in the emergency department. Has appeared stable throughout ER visit, will discharge home with PCP follow up. I discussed results, treatment plan, need for  follow-up, and return precautions with the patient. Provided opportunity for questions, patient confirmed understanding and is in agreement with plan.   Vitals:   01/02/18 1839 01/02/18 2045  BP: 125/81 125/77  Pulse: (!) 103 96  Resp: 18 16  Temp: 98.1 F (36.7 C)   SpO2: 98% 98%     Final Clinical Impressions(s) / ED Diagnoses   Final diagnoses:  Right lower quadrant abdominal pain  Hyperglycemia  Yeast vaginitis  Fatty liver  Hematuria, unspecified type    ED Discharge Orders         Ordered    miconazole (MICONAZOLE  3) 200 MG vaginal suppository  Daily at bedtime     01/02/18 2200           Sheridyn Canino, Glynda Jaeger, PA-C 01/02/18 Chauncey, [place]ue[place], DO 01/02/18 2255

## 2018-01-03 LAB — GC/CHLAMYDIA PROBE AMP (~~LOC~~) NOT AT ARMC
CHLAMYDIA, DNA PROBE: NEGATIVE
Neisseria Gonorrhea: NEGATIVE

## 2018-01-04 DIAGNOSIS — C494 Malignant neoplasm of connective and soft tissue of abdomen: Secondary | ICD-10-CM | POA: Diagnosis not present

## 2018-01-04 DIAGNOSIS — F419 Anxiety disorder, unspecified: Secondary | ICD-10-CM | POA: Diagnosis not present

## 2018-01-04 DIAGNOSIS — E1165 Type 2 diabetes mellitus with hyperglycemia: Secondary | ICD-10-CM | POA: Diagnosis not present

## 2018-01-04 DIAGNOSIS — F250 Schizoaffective disorder, bipolar type: Secondary | ICD-10-CM | POA: Diagnosis not present

## 2018-01-04 DIAGNOSIS — J45909 Unspecified asthma, uncomplicated: Secondary | ICD-10-CM | POA: Diagnosis not present

## 2018-01-04 DIAGNOSIS — G40909 Epilepsy, unspecified, not intractable, without status epilepticus: Secondary | ICD-10-CM | POA: Diagnosis not present

## 2018-01-08 DIAGNOSIS — F250 Schizoaffective disorder, bipolar type: Secondary | ICD-10-CM | POA: Diagnosis not present

## 2018-01-08 DIAGNOSIS — J45909 Unspecified asthma, uncomplicated: Secondary | ICD-10-CM | POA: Diagnosis not present

## 2018-01-08 DIAGNOSIS — G40909 Epilepsy, unspecified, not intractable, without status epilepticus: Secondary | ICD-10-CM | POA: Diagnosis not present

## 2018-01-08 DIAGNOSIS — C494 Malignant neoplasm of connective and soft tissue of abdomen: Secondary | ICD-10-CM | POA: Diagnosis not present

## 2018-01-08 DIAGNOSIS — F419 Anxiety disorder, unspecified: Secondary | ICD-10-CM | POA: Diagnosis not present

## 2018-01-08 DIAGNOSIS — E1165 Type 2 diabetes mellitus with hyperglycemia: Secondary | ICD-10-CM | POA: Diagnosis not present

## 2018-01-11 DIAGNOSIS — C494 Malignant neoplasm of connective and soft tissue of abdomen: Secondary | ICD-10-CM | POA: Diagnosis not present

## 2018-01-11 DIAGNOSIS — J45909 Unspecified asthma, uncomplicated: Secondary | ICD-10-CM | POA: Diagnosis not present

## 2018-01-11 DIAGNOSIS — G40909 Epilepsy, unspecified, not intractable, without status epilepticus: Secondary | ICD-10-CM | POA: Diagnosis not present

## 2018-01-11 DIAGNOSIS — F250 Schizoaffective disorder, bipolar type: Secondary | ICD-10-CM | POA: Diagnosis not present

## 2018-01-11 DIAGNOSIS — E1165 Type 2 diabetes mellitus with hyperglycemia: Secondary | ICD-10-CM | POA: Diagnosis not present

## 2018-01-11 DIAGNOSIS — F419 Anxiety disorder, unspecified: Secondary | ICD-10-CM | POA: Diagnosis not present

## 2018-01-27 DIAGNOSIS — Z79899 Other long term (current) drug therapy: Secondary | ICD-10-CM | POA: Diagnosis not present

## 2018-01-27 DIAGNOSIS — Z794 Long term (current) use of insulin: Secondary | ICD-10-CM | POA: Diagnosis not present

## 2018-01-27 DIAGNOSIS — I10 Essential (primary) hypertension: Secondary | ICD-10-CM | POA: Diagnosis not present

## 2018-01-27 DIAGNOSIS — R1031 Right lower quadrant pain: Secondary | ICD-10-CM | POA: Diagnosis not present

## 2018-01-27 DIAGNOSIS — E1165 Type 2 diabetes mellitus with hyperglycemia: Secondary | ICD-10-CM | POA: Diagnosis not present

## 2018-01-27 DIAGNOSIS — Z8709 Personal history of other diseases of the respiratory system: Secondary | ICD-10-CM | POA: Diagnosis not present

## 2018-01-30 ENCOUNTER — Encounter (HOSPITAL_COMMUNITY): Payer: Self-pay

## 2018-01-30 ENCOUNTER — Emergency Department (HOSPITAL_COMMUNITY)
Admission: EM | Admit: 2018-01-30 | Discharge: 2018-01-30 | Disposition: A | Discharge status: Home / Self Care | Payer: Medicare HMO | Attending: Emergency Medicine | Admitting: Emergency Medicine

## 2018-01-30 ENCOUNTER — Other Ambulatory Visit: Payer: Self-pay

## 2018-01-30 DIAGNOSIS — J45909 Unspecified asthma, uncomplicated: Secondary | ICD-10-CM | POA: Diagnosis not present

## 2018-01-30 DIAGNOSIS — E119 Type 2 diabetes mellitus without complications: Secondary | ICD-10-CM | POA: Insufficient documentation

## 2018-01-30 DIAGNOSIS — Z79899 Other long term (current) drug therapy: Secondary | ICD-10-CM | POA: Diagnosis not present

## 2018-01-30 DIAGNOSIS — R1012 Left upper quadrant pain: Secondary | ICD-10-CM | POA: Diagnosis not present

## 2018-01-30 DIAGNOSIS — Z111 Encounter for screening for respiratory tuberculosis: Secondary | ICD-10-CM | POA: Diagnosis not present

## 2018-01-30 DIAGNOSIS — Z794 Long term (current) use of insulin: Secondary | ICD-10-CM | POA: Diagnosis not present

## 2018-01-30 DIAGNOSIS — R112 Nausea with vomiting, unspecified: Secondary | ICD-10-CM | POA: Diagnosis not present

## 2018-01-30 DIAGNOSIS — I10 Essential (primary) hypertension: Secondary | ICD-10-CM | POA: Diagnosis not present

## 2018-01-30 DIAGNOSIS — E1165 Type 2 diabetes mellitus with hyperglycemia: Secondary | ICD-10-CM | POA: Diagnosis not present

## 2018-01-30 DIAGNOSIS — R1084 Generalized abdominal pain: Secondary | ICD-10-CM | POA: Diagnosis not present

## 2018-01-30 DIAGNOSIS — R52 Pain, unspecified: Secondary | ICD-10-CM | POA: Diagnosis not present

## 2018-01-30 DIAGNOSIS — R42 Dizziness and giddiness: Secondary | ICD-10-CM | POA: Diagnosis not present

## 2018-01-30 DIAGNOSIS — R110 Nausea: Secondary | ICD-10-CM | POA: Diagnosis not present

## 2018-01-30 DIAGNOSIS — J069 Acute upper respiratory infection, unspecified: Secondary | ICD-10-CM | POA: Diagnosis not present

## 2018-01-30 LAB — COMPREHENSIVE METABOLIC PANEL
ALT: 27 U/L (ref 0–44)
AST: 37 U/L (ref 15–41)
Albumin: 3.3 g/dL — ABNORMAL LOW (ref 3.5–5.0)
Alkaline Phosphatase: 85 U/L (ref 38–126)
Anion gap: 10 (ref 5–15)
BUN: 5 mg/dL — ABNORMAL LOW (ref 6–20)
CO2: 20 mmol/L — ABNORMAL LOW (ref 22–32)
Calcium: 8.7 mg/dL — ABNORMAL LOW (ref 8.9–10.3)
Chloride: 102 mmol/L (ref 98–111)
Creatinine, Ser: 0.52 mg/dL (ref 0.44–1.00)
GFR calc Af Amer: >60 mL/min (ref >60)
GFR calc non Af Amer: >60 mL/min (ref >60)
Glucose, Bld: 421 mg/dL — ABNORMAL HIGH (ref 70–99)
Potassium: 3.8 mmol/L (ref 3.5–5.1)
Sodium: 132 mmol/L — ABNORMAL LOW (ref 135–145)
Total Bilirubin: 0.6 mg/dL (ref 0.3–1.2)
Total Protein: 7.1 g/dL (ref 6.5–8.1)

## 2018-01-30 LAB — CBC WITH DIFFERENTIAL/PLATELET
Abs Immature Granulocytes: 0.01 10*3/uL (ref 0.00–0.07)
Basophils Absolute: 0 10*3/uL (ref 0.0–0.1)
Basophils Relative: 1 %
Eosinophils Absolute: 0.1 10*3/uL (ref 0.0–0.5)
Eosinophils Relative: 4 %
HCT: 35.6 % — ABNORMAL LOW (ref 36.0–46.0)
Hemoglobin: 11 g/dL — ABNORMAL LOW (ref 12.0–15.0)
Immature Granulocytes: 0 %
LYMPHS PCT: 28 %
Lymphs Abs: 1 10*3/uL (ref 0.7–4.0)
MCH: 24.9 pg — ABNORMAL LOW (ref 26.0–34.0)
MCHC: 30.9 g/dL (ref 30.0–36.0)
MCV: 80.5 fL (ref 80.0–100.0)
Monocytes Absolute: 0.2 10*3/uL (ref 0.1–1.0)
Monocytes Relative: 5 %
Neutro Abs: 2.2 10*3/uL (ref 1.7–7.7)
Neutrophils Relative %: 62 %
Platelets: 100 10*3/uL — ABNORMAL LOW (ref 150–400)
RBC: 4.42 MIL/uL (ref 3.87–5.11)
RDW: 14.3 % (ref 11.5–15.5)
WBC: 3.4 10*3/uL — AB (ref 4.0–10.5)
nRBC: 0 % (ref 0.0–0.2)

## 2018-01-30 LAB — LIPASE, BLOOD: Lipase: 31 U/L (ref 11–51)

## 2018-01-30 LAB — CBG MONITORING, ED: Glucose-Capillary: 377 mg/dL — ABNORMAL HIGH (ref 70–99)

## 2018-01-30 MED ORDER — SODIUM CHLORIDE 0.9 % IV BOLUS
1000.0000 mL | Freq: Once | INTRAVENOUS | Status: AC
  Administered 2018-01-30: 1000 mL via INTRAVENOUS

## 2018-01-30 MED ORDER — DICYCLOMINE HCL 10 MG/ML IM SOLN
20.0000 mg | Freq: Once | INTRAMUSCULAR | Status: AC
  Administered 2018-01-30: 20 mg via INTRAMUSCULAR
  Filled 2018-01-30: qty 2

## 2018-01-30 NOTE — ED Provider Notes (Signed)
[facility] Provider Note   CSN: [id number] Arrival date & time: 01/30/18  1610     History   Chief Complaint Chief Complaint  Patient presents with  . Abdominal Pain    HPI Tina Saunders is a 33 y.o. female with past medical history of schizophrenia, type 2 diabetes, hypertension, depression, PCOS, presenting to the emergency department via EMS with complaint of left upper quadrant abdominal pain with nausea.  She states pain began earlier today, as sharp and constant.  Rates pain as 8/10 severity.  She was also seen by her PCP today for upper respiratory infection, who noted her CBG was 599.  She was given 50 units of insulin at 11 AM.  She states at home her CBG was still 59 and she took 30 units of insulin.  EMS administered 400 mL's of normal saline and 4 mg of Zofran.  Patient states the Zofran provided relief of her nausea.  She denies associated vomiting, diarrhea, fever, urinary symptoms.  She took ibuprofen and Tylenol for pain.  Abdominal surgeries include cholecystectomy and hernia repairs.  Of note, per chart review patient with 11 ED visits in the last 6 months.  Pt had negative CT scans of her abdomen in December, November, October, and August.   The history is provided by the patient and medical records.    Past Medical History:  Diagnosis Date  . Anxiety   . Asthma   . Bipolar 1 disorder ([place])   . Cancer of abdominal wall   . Depression   . Diabetes mellitus without complication ([place])   . High cholesterol   . Hypertension   . Intentional drug overdose ([place]) 07/26/2017   Seroquel and buspirone   . Obesity   . Obesity   . Polycystic ovarian syndrome 07/01/2011   Patient report  . 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 Schizophrenia ([place])   . Seizures [facility])     Patient Active Problem List   Diagnosis Date Noted  . 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 Adjustment disorder with mixed disturbance of emotions and conduct   . QT prolongation   . Suicide attempt ([place])   . Overdose of antidepressant, intentional self-harm, initial encounter ([place])   . Overdose 07/26/2017  . Suicide attempt by other psychotropic drug overdose ([place])   . DKA (diabetic ketoacidoses) ([place]) 03/14/2017  . Chronic constipation   . Seizures ([place]) 12/11/2016  . Diabetes mellitus type 2 in obese ([place]) 12/11/2016  . Uncontrolled diabetes mellitus ([place]) 11/03/2016  . Schizoaffective disorder, bipolar type ([place]) 11/02/2016  . Cluster B personality disorder ([place]) 11/02/2016  . Suicidal ideation   . 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 HTN (hypertension) 04/15/2013  . Posttraumatic stress disorder 12/07/2011  . PSVT (paroxysmal supraventricular tachycardia) ([place]) 08/26/2011  . ADHD 09/23/2007  . EPIGASTRIC PAIN 09/23/2007  . Obesity, unspecified 07/30/2007  . Schizoaffective disorder, depressive type ([place]) 07/30/2007  . SLEEP DISORDER 07/30/2007  . METRORRHAGIA 06/13/2006  . DISORDER, MENSTRUAL NEC 06/13/2006  . POLYCYSTIC OVARIAN DISEASE 04/25/2006  . AMENORRHEA, SECONDARY 04/20/2006  . ACNE, MILD 04/20/2006    Past Surgical History:  Procedure Laterality Date  . CHOLECYSTECTOMY    . COLONOSCOPY WITH PROPOFOL N/A 03/08/2017   Procedure: COLONOSCOPY WITH PROPOFOL;  Surgeon: Milus Banister, MD;  Location: [facility] ENDOSCOPY;  Service: Endoscopy;  Laterality: N/A;  . HERNIA REPAIR    .  Ovarian Cyst Excision    . VARICOSE VEIN SURGERY       OB History    Gravida  0   Para      Term      Preterm      AB      Living        SAB      TAB      Ectopic      Multiple      Live Births               Home Medications    Prior to Admission medications   Medication Sig Start Date End Date Taking? Authorizing Provider  atorvastatin (LIPITOR) 40 MG tablet Take 1 tablet (40 mg total) by mouth at bedtime. For high  cholesterol 12/06/17  Yes Nwoko, Herbert Pun I, NP  bisacodyl (DULCOLAX) 5 MG EC tablet Take 2 tablets (10 mg total) by mouth daily as needed for moderate constipation. (May buy from over the counter): For constipation 12/06/17  Yes Nwoko, Herbert Pun I, NP  busPIRone (BUSPAR) 15 MG tablet Take 1 tablet (15 mg total) by mouth 3 (three) times daily. For anxiety 12/06/17  Yes Lindell Spar I, NP  chlorproMAZINE (THORAZINE) 50 MG tablet Take 1 tablet (50 mg total) by mouth 3 (three) times daily. For mood control 12/06/17  Yes Lindell Spar I, NP  gabapentin (NEURONTIN) 300 MG capsule Take 2 capsules (600 mg total) by mouth 3 (three) times daily. For agitation 12/06/17  Yes Nwoko, Herbert Pun I, NP  insulin aspart (NOVOLOG) 100 UNIT/ML injection Inject 30 Units into the skin 3 (three) times daily with meals. For diabetes management 12/06/17  Yes Lindell Spar I, NP  insulin detemir (LEVEMIR) 100 UNIT/ML injection Inject 0.7 mLs (70 Units total) into the skin 2 (two) times daily. For diabetes management 12/06/17  Yes Lindell Spar I, NP  levETIRAcetam (KEPPRA) 500 MG tablet Take 1 tablet (500 mg total) by mouth 2 (two) times daily. For seizure activities 12/06/17  Yes Lindell Spar I, NP  lisinopril (PRINIVIL,ZESTRIL) 5 MG tablet Take 1 tablet (5 mg total) by mouth daily. For high blood pressure 12/07/17  Yes Nwoko, Agnes I, NP  magnesium oxide (MAG-OX) 400 (241.3 Mg) MG tablet Take 1 tablet (400 mg total) by mouth daily. For acid rflux 12/07/17  Yes Nwoko, Herbert Pun I, NP  Melatonin 3 MG TABS Take 1 tablet (3 mg total) by mouth at bedtime. For sleep 12/06/17  Yes Lindell Spar I, NP  metoprolol tartrate (LOPRESSOR) 25 MG tablet Take 1 tablet (25 mg total) by mouth 2 (two) times daily. For high blood pressure 12/06/17  Yes Nwoko, Agnes I, NP  prazosin (MINIPRESS) 1 MG capsule Take 1 capsule (1 mg total) by mouth at bedtime. For nightmares 12/06/17  Yes Lindell Spar I, NP  QUEtiapine (SEROQUEL) 100 MG tablet Take 1 tablet (100 mg total)  by mouth daily. For mood control 12/06/17  Yes Nwoko, Herbert Pun I, NP  QUEtiapine (SEROQUEL) 200 MG tablet Take 2.5 tablets (500 mg total) by mouth at bedtime. For mood control 12/06/17  Yes Lindell Spar I, NP  sertraline (ZOLOFT) 100 MG tablet Take 2 tablets (200 mg total) by mouth daily. Fordepression 12/07/17  Yes Lindell Spar I, NP  topiramate (TOPAMAX) 100 MG tablet Take 1 tablet (100 mg total) by mouth at bedtime. For mood stabilization 12/06/17  Yes Nwoko, Herbert Pun I, NP  dicyclomine (BENTYL) 20 MG tablet Take 1 tablet (20 mg total) by mouth 2 (two)  times daily for 7 days. 12/09/17 12/16/17  Mortis, Alvie Heidelberg I, PA-C  Insulin Syringe-Needle U-100 25G X 1" 1 ML MISC 1 Syringe by Does not apply route 4 (four) times daily -  before meals and at bedtime. For blood sugar checks 12/06/17   Lindell Spar I, NP  miconazole (MICONAZOLE 3) 200 MG vaginal suppository Place 1 suppository (200 mg total) vaginally at bedtime. 01/02/18   Petrucelli, Samantha R, PA-C  ondansetron (ZOFRAN) 4 MG tablet Take 1 tablet (4 mg total) by mouth every 8 (eight) hours as needed for nausea or vomiting. 12/09/17   Mortis, Alvie Heidelberg I, PA-C  sulfamethoxazole-trimethoprim (BACTRIM,SEPTRA) 400-80 MG tablet Take 1 tablet by mouth every 12 (twelve) hours. For uti Patient not taking: Reported on 01/30/2018 12/06/17   Lindell Spar I, NP    Family History Family History  Problem Relation Age of Onset  . Coronary artery disease Maternal Grandmother   . Diabetes type II Maternal Grandmother   . Cancer Maternal Grandmother   . Hypertension Mother   . Hypertension Father     Social History Social History   Tobacco Use  . Smoking status: Never Smoker  . Smokeless tobacco: Never Used  Substance Use Topics  . Alcohol use: No  . Drug use: No     Allergies   Fish-derived products; Geodon [ziprasidone hcl]; Haldol [haloperidol lactate]; Buprenorphine hcl; Compazine [prochlorperazine]; Morphine and related; and Toradol [ketorolac  tromethamine]   Review of Systems Review of Systems  Constitutional: Negative for fever.  Gastrointestinal: Positive for abdominal pain and nausea. Negative for diarrhea and vomiting.  Genitourinary: Negative for dysuria and frequency.  All other systems reviewed and are negative.    Physical Exam Updated Vital Signs BP (!) 146/76 (BP Location: Right Arm)   Pulse 81   Temp 99.1 F (37.3 C) (Oral)   Resp 17   Ht 5\' 4"  (1.626 m)   Wt 97.5 kg   LMP 01/22/2018   SpO2 98%   BMI 36.90 kg/m   Physical Exam Vitals signs and nursing note reviewed.  Constitutional:      General: She is not in acute distress.    Appearance: She is well-developed. She is obese. She is not ill-appearing.  HENT:     Head: Normocephalic and atraumatic.     Mouth/Throat:     Mouth: Mucous membranes are moist.  Eyes:     Conjunctiva/sclera: Conjunctivae normal.  Cardiovascular:     Rate and Rhythm: Normal rate and regular rhythm.     Heart sounds: Normal heart sounds.  Pulmonary:     Effort: Pulmonary effort is normal.     Breath sounds: Normal breath sounds.  Abdominal:     General: Bowel sounds are normal.     Palpations: Abdomen is soft.     Tenderness: There is abdominal tenderness in the epigastric area and left upper quadrant. There is no guarding or rebound.  Skin:    General: Skin is warm.  Neurological:     Mental Status: She is alert.  Psychiatric:        Behavior: Behavior normal.      ED Treatments / Results  Labs (all labs ordered are listed, but only abnormal results are displayed) Labs Reviewed  COMPREHENSIVE METABOLIC PANEL - Abnormal; Notable for the following components:      Result Value   Sodium 132 (*)    CO2 20 (*)    Glucose, Bld 421 (*)    BUN 5 (*)    Calcium  8.7 (*)    Albumin 3.3 (*)    All other components within normal limits  CBC WITH DIFFERENTIAL/PLATELET - Abnormal; Notable for the following components:   WBC 3.4 (*)    Hemoglobin 11.0 (*)    HCT  35.6 (*)    MCH 24.9 (*)    Platelets 100 (*)    All other components within normal limits  CBG MONITORING, ED - Abnormal; Notable for the following components:   Glucose-Capillary 377 (*)    All other components within normal limits  LIPASE, BLOOD  URINALYSIS, ROUTINE W REFLEX MICROSCOPIC    EKG None  Radiology No results found.  Procedures Procedures (including critical care time)  Medications Ordered in ED Medications  sodium chloride 0.9 % bolus 1,000 mL (0 mLs Intravenous Stopped 01/30/18 1842)  dicyclomine (BENTYL) injection 20 mg (20 mg Intramuscular Given 01/30/18 1738)     Initial Impression / Assessment and Plan / ED Course  I have reviewed the triage vital signs and the nursing notes.  Pertinent labs & imaging results that were available during my care of the patient were reviewed by me and considered in my medical decision making (see chart for details).     Patient presenting with abdominal pain with nausea, onset today.  Hyperglycemic at home.  Patient with few ED visits recently regarding abdominal pain with nausea as well.  Negative CT scans during that visit.  On evaluation, patient is in no apparent distress.  Abdomen with left upper quadrant and epigastric tenderness without peritoneal signs.  Afebrile.  CBG 377.  CMP without evidence of DKA.  Lipase within normal limits.  No leukocytosis.  Question gastroparesis as etiology of patient's recurrent abdominal pain with nausea.  Recommend close follow-up with PCP.  No evidence of acute abdomen today.  Patient does not meet the SIRS or Sepsis criteria. Patient discharged home with symptomatic treatment and given strict instructions for follow-up with their primary care physician.  Pt agreeable to plan and safe for discharge.  Discussed results, findings, treatment and follow up. Patient advised of return precautions. Patient verbalized understanding and agreed with plan.  Final Clinical Impressions(s) / ED Diagnoses     Final diagnoses:  Left upper quadrant pain  Non-intractable vomiting with nausea, unspecified vomiting type    ED Discharge Orders    None       , [place] N, PA-C 01/30/18 2236    Drenda Freeze, MD 01/30/18 [phone]

## 2018-01-30 NOTE — Discharge Instructions (Signed)
Please read instructions below. Drink clear liquids until your stomach feels better. Then, slowly introduce bland foods into your diet as tolerated, such as bread, rice, apples, bananas. You can take tylenol every 4 hours as needed for pain. Follow up with your primary care to discuss your recurrent episodes of abdominal pain with nausea and vomiting. Return to the ER for severe abdominal pain, high fever, uncontrollable vomiting, or new or concerning symptoms.

## 2018-01-30 NOTE — ED Notes (Signed)
Pt alert and oriented in NAD. Pt verbalized understanding of discharge instructions. 

## 2018-01-30 NOTE — ED Triage Notes (Signed)
[facility] states pt went to pcp today for a cough x 2 weeks. At pcp cbg 599, given 50 units of insulin at 1100 am. At home her cbg 599 was, she took 30 units of insulin. She called ems for N, 8/10 ab pain, and dizziness. cbg for ems 489, she was gioving 400 ml NS and 4 mg Zofran.  98.5 oral temp 136/90 hr 94, 97% 18 RR  Pt tells me her cbg is normally around 315

## 2018-02-04 DIAGNOSIS — M791 Myalgia, unspecified site: Secondary | ICD-10-CM | POA: Diagnosis not present

## 2018-02-04 DIAGNOSIS — F259 Schizoaffective disorder, unspecified: Secondary | ICD-10-CM | POA: Diagnosis not present

## 2018-02-04 DIAGNOSIS — F319 Bipolar disorder, unspecified: Secondary | ICD-10-CM | POA: Diagnosis not present

## 2018-02-04 DIAGNOSIS — Z79899 Other long term (current) drug therapy: Secondary | ICD-10-CM | POA: Diagnosis not present

## 2018-02-04 DIAGNOSIS — R569 Unspecified convulsions: Secondary | ICD-10-CM | POA: Diagnosis not present

## 2018-02-04 DIAGNOSIS — E1165 Type 2 diabetes mellitus with hyperglycemia: Secondary | ICD-10-CM | POA: Diagnosis not present

## 2018-02-04 DIAGNOSIS — Z6837 Body mass index (BMI) 37.0-37.9, adult: Secondary | ICD-10-CM | POA: Diagnosis not present

## 2018-02-15 DIAGNOSIS — R32 Unspecified urinary incontinence: Secondary | ICD-10-CM | POA: Diagnosis not present

## 2018-02-21 DIAGNOSIS — R569 Unspecified convulsions: Secondary | ICD-10-CM | POA: Diagnosis not present

## 2018-02-22 DIAGNOSIS — R569 Unspecified convulsions: Secondary | ICD-10-CM | POA: Diagnosis not present

## 2018-02-23 DIAGNOSIS — R569 Unspecified convulsions: Secondary | ICD-10-CM | POA: Diagnosis not present

## 2018-02-24 DIAGNOSIS — R569 Unspecified convulsions: Secondary | ICD-10-CM | POA: Diagnosis not present

## 2018-02-25 DIAGNOSIS — R569 Unspecified convulsions: Secondary | ICD-10-CM | POA: Diagnosis not present

## 2018-02-26 DIAGNOSIS — R569 Unspecified convulsions: Secondary | ICD-10-CM | POA: Diagnosis not present

## 2018-02-27 DIAGNOSIS — R569 Unspecified convulsions: Secondary | ICD-10-CM | POA: Diagnosis not present

## 2018-02-28 DIAGNOSIS — R569 Unspecified convulsions: Secondary | ICD-10-CM | POA: Diagnosis not present

## 2018-03-01 DIAGNOSIS — Z794 Long term (current) use of insulin: Secondary | ICD-10-CM | POA: Diagnosis not present

## 2018-03-01 DIAGNOSIS — I10 Essential (primary) hypertension: Secondary | ICD-10-CM | POA: Diagnosis not present

## 2018-03-01 DIAGNOSIS — E119 Type 2 diabetes mellitus without complications: Secondary | ICD-10-CM | POA: Diagnosis not present

## 2018-03-01 DIAGNOSIS — Z79899 Other long term (current) drug therapy: Secondary | ICD-10-CM | POA: Diagnosis not present

## 2018-03-01 DIAGNOSIS — R069 Unspecified abnormalities of breathing: Secondary | ICD-10-CM | POA: Diagnosis not present

## 2018-03-01 DIAGNOSIS — R569 Unspecified convulsions: Secondary | ICD-10-CM | POA: Diagnosis not present

## 2018-03-01 DIAGNOSIS — E1165 Type 2 diabetes mellitus with hyperglycemia: Secondary | ICD-10-CM | POA: Diagnosis not present

## 2018-03-01 DIAGNOSIS — R402412 Glasgow coma scale score 13-15, at arrival to emergency department: Secondary | ICD-10-CM | POA: Diagnosis not present

## 2018-03-01 DIAGNOSIS — R0602 Shortness of breath: Secondary | ICD-10-CM | POA: Diagnosis not present

## 2018-03-01 DIAGNOSIS — G40909 Epilepsy, unspecified, not intractable, without status epilepticus: Secondary | ICD-10-CM | POA: Diagnosis not present

## 2018-03-01 DIAGNOSIS — R404 Transient alteration of awareness: Secondary | ICD-10-CM | POA: Diagnosis not present

## 2018-03-02 DIAGNOSIS — R569 Unspecified convulsions: Secondary | ICD-10-CM | POA: Diagnosis not present

## 2018-03-03 DIAGNOSIS — R569 Unspecified convulsions: Secondary | ICD-10-CM | POA: Diagnosis not present

## 2018-03-04 DIAGNOSIS — R569 Unspecified convulsions: Secondary | ICD-10-CM | POA: Diagnosis not present

## 2018-03-05 DIAGNOSIS — R569 Unspecified convulsions: Secondary | ICD-10-CM | POA: Diagnosis not present

## 2018-03-06 DIAGNOSIS — R569 Unspecified convulsions: Secondary | ICD-10-CM | POA: Diagnosis not present

## 2018-03-06 DIAGNOSIS — M545 Low back pain: Secondary | ICD-10-CM | POA: Diagnosis not present

## 2018-03-06 DIAGNOSIS — E1165 Type 2 diabetes mellitus with hyperglycemia: Secondary | ICD-10-CM | POA: Diagnosis not present

## 2018-03-06 DIAGNOSIS — Z1331 Encounter for screening for depression: Secondary | ICD-10-CM | POA: Diagnosis not present

## 2018-03-06 DIAGNOSIS — Z6838 Body mass index (BMI) 38.0-38.9, adult: Secondary | ICD-10-CM | POA: Diagnosis not present

## 2018-03-06 DIAGNOSIS — F259 Schizoaffective disorder, unspecified: Secondary | ICD-10-CM | POA: Diagnosis not present

## 2018-03-07 DIAGNOSIS — R569 Unspecified convulsions: Secondary | ICD-10-CM | POA: Diagnosis not present

## 2018-03-08 DIAGNOSIS — R569 Unspecified convulsions: Secondary | ICD-10-CM | POA: Diagnosis not present

## 2018-03-09 DIAGNOSIS — R569 Unspecified convulsions: Secondary | ICD-10-CM | POA: Diagnosis not present

## 2018-03-10 DIAGNOSIS — R569 Unspecified convulsions: Secondary | ICD-10-CM | POA: Diagnosis not present

## 2018-03-11 DIAGNOSIS — R569 Unspecified convulsions: Secondary | ICD-10-CM | POA: Diagnosis not present

## 2018-03-12 DIAGNOSIS — R569 Unspecified convulsions: Secondary | ICD-10-CM | POA: Diagnosis not present

## 2018-03-13 DIAGNOSIS — R569 Unspecified convulsions: Secondary | ICD-10-CM | POA: Diagnosis not present

## 2018-03-14 DIAGNOSIS — R569 Unspecified convulsions: Secondary | ICD-10-CM | POA: Diagnosis not present

## 2018-03-15 DIAGNOSIS — R569 Unspecified convulsions: Secondary | ICD-10-CM | POA: Diagnosis not present

## 2018-03-16 DIAGNOSIS — R569 Unspecified convulsions: Secondary | ICD-10-CM | POA: Diagnosis not present

## 2018-03-17 DIAGNOSIS — R569 Unspecified convulsions: Secondary | ICD-10-CM | POA: Diagnosis not present

## 2018-03-18 DIAGNOSIS — R569 Unspecified convulsions: Secondary | ICD-10-CM | POA: Diagnosis not present

## 2018-03-19 DIAGNOSIS — R569 Unspecified convulsions: Secondary | ICD-10-CM | POA: Diagnosis not present

## 2018-03-20 DIAGNOSIS — R569 Unspecified convulsions: Secondary | ICD-10-CM | POA: Diagnosis not present

## 2018-03-21 DIAGNOSIS — R569 Unspecified convulsions: Secondary | ICD-10-CM | POA: Diagnosis not present

## 2018-03-22 DIAGNOSIS — R569 Unspecified convulsions: Secondary | ICD-10-CM | POA: Diagnosis not present

## 2018-03-23 DIAGNOSIS — R569 Unspecified convulsions: Secondary | ICD-10-CM | POA: Diagnosis not present

## 2018-03-24 DIAGNOSIS — R569 Unspecified convulsions: Secondary | ICD-10-CM | POA: Diagnosis not present

## 2018-03-25 DIAGNOSIS — R569 Unspecified convulsions: Secondary | ICD-10-CM | POA: Diagnosis not present

## 2018-03-26 DIAGNOSIS — R569 Unspecified convulsions: Secondary | ICD-10-CM | POA: Diagnosis not present

## 2018-03-27 DIAGNOSIS — R569 Unspecified convulsions: Secondary | ICD-10-CM | POA: Diagnosis not present

## 2018-03-28 DIAGNOSIS — R569 Unspecified convulsions: Secondary | ICD-10-CM | POA: Diagnosis not present

## 2018-03-29 DIAGNOSIS — R569 Unspecified convulsions: Secondary | ICD-10-CM | POA: Diagnosis not present

## 2018-03-30 DIAGNOSIS — R569 Unspecified convulsions: Secondary | ICD-10-CM | POA: Diagnosis not present

## 2018-03-31 DIAGNOSIS — R569 Unspecified convulsions: Secondary | ICD-10-CM | POA: Diagnosis not present

## 2018-04-01 DIAGNOSIS — R569 Unspecified convulsions: Secondary | ICD-10-CM | POA: Diagnosis not present

## 2018-04-02 DIAGNOSIS — R569 Unspecified convulsions: Secondary | ICD-10-CM | POA: Diagnosis not present

## 2018-04-03 DIAGNOSIS — R569 Unspecified convulsions: Secondary | ICD-10-CM | POA: Diagnosis not present

## 2018-04-04 DIAGNOSIS — R569 Unspecified convulsions: Secondary | ICD-10-CM | POA: Diagnosis not present

## 2018-04-05 DIAGNOSIS — E1165 Type 2 diabetes mellitus with hyperglycemia: Secondary | ICD-10-CM | POA: Diagnosis not present

## 2018-04-05 DIAGNOSIS — K0889 Other specified disorders of teeth and supporting structures: Secondary | ICD-10-CM | POA: Diagnosis not present

## 2018-04-05 DIAGNOSIS — I8390 Asymptomatic varicose veins of unspecified lower extremity: Secondary | ICD-10-CM | POA: Diagnosis not present

## 2018-04-05 DIAGNOSIS — R569 Unspecified convulsions: Secondary | ICD-10-CM | POA: Diagnosis not present

## 2018-04-05 DIAGNOSIS — F259 Schizoaffective disorder, unspecified: Secondary | ICD-10-CM | POA: Diagnosis not present

## 2018-04-06 DIAGNOSIS — R569 Unspecified convulsions: Secondary | ICD-10-CM | POA: Diagnosis not present

## 2018-04-07 DIAGNOSIS — R569 Unspecified convulsions: Secondary | ICD-10-CM | POA: Diagnosis not present

## 2018-04-08 DIAGNOSIS — R569 Unspecified convulsions: Secondary | ICD-10-CM | POA: Diagnosis not present

## 2018-04-09 DIAGNOSIS — R569 Unspecified convulsions: Secondary | ICD-10-CM | POA: Diagnosis not present

## 2018-04-10 DIAGNOSIS — R569 Unspecified convulsions: Secondary | ICD-10-CM | POA: Diagnosis not present

## 2018-04-11 DIAGNOSIS — R32 Unspecified urinary incontinence: Secondary | ICD-10-CM | POA: Diagnosis not present

## 2018-04-11 DIAGNOSIS — R569 Unspecified convulsions: Secondary | ICD-10-CM | POA: Diagnosis not present

## 2018-04-12 DIAGNOSIS — R569 Unspecified convulsions: Secondary | ICD-10-CM | POA: Diagnosis not present

## 2018-04-13 DIAGNOSIS — R569 Unspecified convulsions: Secondary | ICD-10-CM | POA: Diagnosis not present

## 2018-04-14 DIAGNOSIS — R569 Unspecified convulsions: Secondary | ICD-10-CM | POA: Diagnosis not present

## 2018-04-15 DIAGNOSIS — R569 Unspecified convulsions: Secondary | ICD-10-CM | POA: Diagnosis not present

## 2018-04-16 DIAGNOSIS — R569 Unspecified convulsions: Secondary | ICD-10-CM | POA: Diagnosis not present

## 2018-04-17 DIAGNOSIS — R569 Unspecified convulsions: Secondary | ICD-10-CM | POA: Diagnosis not present

## 2018-04-18 DIAGNOSIS — R569 Unspecified convulsions: Secondary | ICD-10-CM | POA: Diagnosis not present

## 2018-04-19 DIAGNOSIS — R569 Unspecified convulsions: Secondary | ICD-10-CM | POA: Diagnosis not present

## 2018-04-24 ENCOUNTER — Telehealth: Payer: Self-pay

## 2018-04-24 NOTE — Telephone Encounter (Signed)
Due to current COVID 19 pandemic, our office is severely reducing in office visits for at least the next 2 weeks, in order to minimize the risk to our patients and healthcare providers.    I was able to reach Lala Lund, sister in law, and she stated that the patient is currently in assisted living. I made her aware of the different options we could do in regards to her appt that was scheduled for tomorrow. She says the patient does have a laptop. She is going to contact the facility and try to have them or the patient contact us back to either schedule a virtual visit or reschedule to a later date.

## 2018-04-24 NOTE — Telephone Encounter (Signed)
I called Melissa back but she did not answer so I left a message. If she calls back, will you please ask if they would like to reschedule the patient's appt? Thanks.

## 2018-04-24 NOTE — Telephone Encounter (Signed)
[facility] has called stating they and pt herself are her care providers and that Lala Lund, sister in law should be taken off pt's paperwork.  The purpose of a DPR was explained to [facility].  Lenna Sciara is asking for a call back re: r/s pt's appointment.

## 2018-04-25 ENCOUNTER — Ambulatory Visit: Payer: Medicare HMO | Admitting: Neurology

## 2018-04-25 ENCOUNTER — Encounter

## 2018-06-05 DIAGNOSIS — N926 Irregular menstruation, unspecified: Secondary | ICD-10-CM | POA: Diagnosis not present

## 2018-06-05 DIAGNOSIS — F259 Schizoaffective disorder, unspecified: Secondary | ICD-10-CM | POA: Diagnosis not present

## 2018-06-05 DIAGNOSIS — E1165 Type 2 diabetes mellitus with hyperglycemia: Secondary | ICD-10-CM | POA: Diagnosis not present

## 2018-06-05 DIAGNOSIS — R569 Unspecified convulsions: Secondary | ICD-10-CM | POA: Diagnosis not present

## 2018-06-05 DIAGNOSIS — N643 Galactorrhea not associated with childbirth: Secondary | ICD-10-CM | POA: Diagnosis not present

## 2018-06-05 DIAGNOSIS — Z6841 Body Mass Index (BMI) 40.0 and over, adult: Secondary | ICD-10-CM | POA: Diagnosis not present

## 2018-06-05 DIAGNOSIS — R040 Epistaxis: Secondary | ICD-10-CM | POA: Diagnosis not present

## 2018-06-05 DIAGNOSIS — Z85831 Personal history of malignant neoplasm of soft tissue: Secondary | ICD-10-CM | POA: Diagnosis not present

## 2018-06-19 DIAGNOSIS — R569 Unspecified convulsions: Secondary | ICD-10-CM | POA: Diagnosis not present

## 2018-06-19 DIAGNOSIS — Z6841 Body Mass Index (BMI) 40.0 and over, adult: Secondary | ICD-10-CM | POA: Diagnosis not present

## 2018-06-19 DIAGNOSIS — Z79899 Other long term (current) drug therapy: Secondary | ICD-10-CM | POA: Diagnosis not present

## 2018-06-19 DIAGNOSIS — E1165 Type 2 diabetes mellitus with hyperglycemia: Secondary | ICD-10-CM | POA: Diagnosis not present

## 2018-06-19 DIAGNOSIS — E7800 Pure hypercholesterolemia, unspecified: Secondary | ICD-10-CM | POA: Diagnosis not present

## 2018-06-19 DIAGNOSIS — F259 Schizoaffective disorder, unspecified: Secondary | ICD-10-CM | POA: Diagnosis not present

## 2018-06-20 DIAGNOSIS — N6452 Nipple discharge: Secondary | ICD-10-CM | POA: Diagnosis not present

## 2018-06-20 DIAGNOSIS — R87610 Atypical squamous cells of undetermined significance on cytologic smear of cervix (ASC-US): Secondary | ICD-10-CM | POA: Diagnosis not present

## 2018-06-20 DIAGNOSIS — E1165 Type 2 diabetes mellitus with hyperglycemia: Secondary | ICD-10-CM | POA: Diagnosis not present

## 2018-06-20 DIAGNOSIS — Z6841 Body Mass Index (BMI) 40.0 and over, adult: Secondary | ICD-10-CM | POA: Diagnosis not present

## 2018-06-20 DIAGNOSIS — N939 Abnormal uterine and vaginal bleeding, unspecified: Secondary | ICD-10-CM | POA: Diagnosis not present

## 2018-06-29 DIAGNOSIS — R569 Unspecified convulsions: Secondary | ICD-10-CM | POA: Diagnosis not present

## 2018-06-30 DIAGNOSIS — R569 Unspecified convulsions: Secondary | ICD-10-CM | POA: Diagnosis not present

## 2018-07-01 DIAGNOSIS — N6452 Nipple discharge: Secondary | ICD-10-CM | POA: Diagnosis not present

## 2018-07-01 DIAGNOSIS — R569 Unspecified convulsions: Secondary | ICD-10-CM | POA: Diagnosis not present

## 2018-07-01 DIAGNOSIS — N939 Abnormal uterine and vaginal bleeding, unspecified: Secondary | ICD-10-CM | POA: Diagnosis not present

## 2018-07-01 DIAGNOSIS — R7989 Other specified abnormal findings of blood chemistry: Secondary | ICD-10-CM | POA: Diagnosis not present

## 2018-07-02 DIAGNOSIS — R569 Unspecified convulsions: Secondary | ICD-10-CM | POA: Diagnosis not present

## 2018-07-03 DIAGNOSIS — R569 Unspecified convulsions: Secondary | ICD-10-CM | POA: Diagnosis not present

## 2018-07-04 DIAGNOSIS — R569 Unspecified convulsions: Secondary | ICD-10-CM | POA: Diagnosis not present

## 2018-07-05 DIAGNOSIS — R569 Unspecified convulsions: Secondary | ICD-10-CM | POA: Diagnosis not present

## 2018-07-06 DIAGNOSIS — R569 Unspecified convulsions: Secondary | ICD-10-CM | POA: Diagnosis not present

## 2018-07-07 DIAGNOSIS — R569 Unspecified convulsions: Secondary | ICD-10-CM | POA: Diagnosis not present

## 2018-07-08 DIAGNOSIS — E1165 Type 2 diabetes mellitus with hyperglycemia: Secondary | ICD-10-CM | POA: Diagnosis not present

## 2018-07-08 DIAGNOSIS — N939 Abnormal uterine and vaginal bleeding, unspecified: Secondary | ICD-10-CM | POA: Diagnosis not present

## 2018-07-08 DIAGNOSIS — N6452 Nipple discharge: Secondary | ICD-10-CM | POA: Diagnosis not present

## 2018-07-08 DIAGNOSIS — R569 Unspecified convulsions: Secondary | ICD-10-CM | POA: Diagnosis not present

## 2018-07-09 DIAGNOSIS — R569 Unspecified convulsions: Secondary | ICD-10-CM | POA: Diagnosis not present

## 2018-07-10 DIAGNOSIS — Z794 Long term (current) use of insulin: Secondary | ICD-10-CM | POA: Diagnosis not present

## 2018-07-10 DIAGNOSIS — E785 Hyperlipidemia, unspecified: Secondary | ICD-10-CM | POA: Diagnosis not present

## 2018-07-10 DIAGNOSIS — R1084 Generalized abdominal pain: Secondary | ICD-10-CM | POA: Diagnosis not present

## 2018-07-10 DIAGNOSIS — J45909 Unspecified asthma, uncomplicated: Secondary | ICD-10-CM | POA: Diagnosis not present

## 2018-07-10 DIAGNOSIS — R52 Pain, unspecified: Secondary | ICD-10-CM | POA: Diagnosis not present

## 2018-07-10 DIAGNOSIS — I10 Essential (primary) hypertension: Secondary | ICD-10-CM | POA: Diagnosis not present

## 2018-07-10 DIAGNOSIS — E119 Type 2 diabetes mellitus without complications: Secondary | ICD-10-CM | POA: Diagnosis not present

## 2018-07-10 DIAGNOSIS — Z79899 Other long term (current) drug therapy: Secondary | ICD-10-CM | POA: Diagnosis not present

## 2018-07-10 DIAGNOSIS — N83201 Unspecified ovarian cyst, right side: Secondary | ICD-10-CM | POA: Diagnosis not present

## 2018-07-10 DIAGNOSIS — N838 Other noninflammatory disorders of ovary, fallopian tube and broad ligament: Secondary | ICD-10-CM | POA: Diagnosis not present

## 2018-07-10 DIAGNOSIS — R1031 Right lower quadrant pain: Secondary | ICD-10-CM | POA: Diagnosis not present

## 2018-07-10 DIAGNOSIS — R569 Unspecified convulsions: Secondary | ICD-10-CM | POA: Diagnosis not present

## 2018-07-11 DIAGNOSIS — R569 Unspecified convulsions: Secondary | ICD-10-CM | POA: Diagnosis not present

## 2018-07-11 DIAGNOSIS — N8311 Corpus luteum cyst of right ovary: Secondary | ICD-10-CM | POA: Diagnosis not present

## 2018-07-11 DIAGNOSIS — N83201 Unspecified ovarian cyst, right side: Secondary | ICD-10-CM | POA: Diagnosis not present

## 2018-07-11 DIAGNOSIS — R1031 Right lower quadrant pain: Secondary | ICD-10-CM | POA: Diagnosis not present

## 2018-07-11 DIAGNOSIS — I748 Embolism and thrombosis of other arteries: Secondary | ICD-10-CM | POA: Diagnosis not present

## 2018-07-11 DIAGNOSIS — E119 Type 2 diabetes mellitus without complications: Secondary | ICD-10-CM | POA: Diagnosis not present

## 2018-07-12 DIAGNOSIS — R569 Unspecified convulsions: Secondary | ICD-10-CM | POA: Diagnosis not present

## 2018-07-27 DIAGNOSIS — R569 Unspecified convulsions: Secondary | ICD-10-CM | POA: Diagnosis not present

## 2018-07-28 DIAGNOSIS — R569 Unspecified convulsions: Secondary | ICD-10-CM | POA: Diagnosis not present

## 2018-07-29 DIAGNOSIS — N939 Abnormal uterine and vaginal bleeding, unspecified: Secondary | ICD-10-CM | POA: Insufficient documentation

## 2018-07-29 DIAGNOSIS — Z8742 Personal history of other diseases of the female genital tract: Secondary | ICD-10-CM | POA: Insufficient documentation

## 2018-07-29 DIAGNOSIS — R569 Unspecified convulsions: Secondary | ICD-10-CM | POA: Diagnosis not present

## 2018-07-30 DIAGNOSIS — R569 Unspecified convulsions: Secondary | ICD-10-CM | POA: Diagnosis not present

## 2018-07-31 DIAGNOSIS — R569 Unspecified convulsions: Secondary | ICD-10-CM | POA: Diagnosis not present

## 2018-08-01 DIAGNOSIS — R569 Unspecified convulsions: Secondary | ICD-10-CM | POA: Diagnosis not present

## 2018-08-02 DIAGNOSIS — R569 Unspecified convulsions: Secondary | ICD-10-CM | POA: Diagnosis not present

## 2018-08-03 DIAGNOSIS — R569 Unspecified convulsions: Secondary | ICD-10-CM | POA: Diagnosis not present

## 2018-08-04 DIAGNOSIS — R569 Unspecified convulsions: Secondary | ICD-10-CM | POA: Diagnosis not present

## 2018-08-05 DIAGNOSIS — R569 Unspecified convulsions: Secondary | ICD-10-CM | POA: Diagnosis not present

## 2018-08-06 DIAGNOSIS — R569 Unspecified convulsions: Secondary | ICD-10-CM | POA: Diagnosis not present

## 2018-08-07 DIAGNOSIS — R569 Unspecified convulsions: Secondary | ICD-10-CM | POA: Diagnosis not present

## 2018-08-08 DIAGNOSIS — R569 Unspecified convulsions: Secondary | ICD-10-CM | POA: Diagnosis not present

## 2018-08-09 DIAGNOSIS — R569 Unspecified convulsions: Secondary | ICD-10-CM | POA: Diagnosis not present

## 2018-08-09 DIAGNOSIS — R32 Unspecified urinary incontinence: Secondary | ICD-10-CM | POA: Diagnosis not present

## 2018-08-27 DIAGNOSIS — K760 Fatty (change of) liver, not elsewhere classified: Secondary | ICD-10-CM | POA: Diagnosis not present

## 2018-08-27 DIAGNOSIS — F259 Schizoaffective disorder, unspecified: Secondary | ICD-10-CM | POA: Diagnosis not present

## 2018-08-27 DIAGNOSIS — Z6841 Body Mass Index (BMI) 40.0 and over, adult: Secondary | ICD-10-CM | POA: Diagnosis not present

## 2018-08-27 DIAGNOSIS — R569 Unspecified convulsions: Secondary | ICD-10-CM | POA: Diagnosis not present

## 2018-08-27 DIAGNOSIS — E1165 Type 2 diabetes mellitus with hyperglycemia: Secondary | ICD-10-CM | POA: Diagnosis not present

## 2018-08-29 ENCOUNTER — Other Ambulatory Visit: Payer: Self-pay

## 2018-08-29 ENCOUNTER — Ambulatory Visit (INDEPENDENT_AMBULATORY_CARE_PROVIDER_SITE_OTHER): Payer: Medicare HMO | Admitting: Neurology

## 2018-08-29 ENCOUNTER — Telehealth: Payer: Self-pay | Admitting: Neurology

## 2018-08-29 ENCOUNTER — Encounter: Payer: Self-pay | Admitting: Neurology

## 2018-08-29 VITALS — BP 130/85 | HR 78 | Temp 98.0°F

## 2018-08-29 DIAGNOSIS — Z79899 Other long term (current) drug therapy: Secondary | ICD-10-CM | POA: Diagnosis not present

## 2018-08-29 DIAGNOSIS — R569 Unspecified convulsions: Secondary | ICD-10-CM | POA: Diagnosis not present

## 2018-08-29 MED ORDER — LEVETIRACETAM 1000 MG PO TABS: 1000.0000 mg | ORAL_TABLET | Freq: Two times a day (BID) | ORAL | 4 refills | Status: AC

## 2018-08-29 MED ORDER — LAMOTRIGINE 100 MG PO TABS: 100.0000 mg | ORAL_TABLET | Freq: Two times a day (BID) | ORAL | 4 refills | Status: DC

## 2018-08-29 MED ORDER — LAMOTRIGINE 25 MG PO TABS: 25.0000 mg | ORAL_TABLET | Freq: Every day | ORAL | 0 refills | Status: DC

## 2018-08-29 NOTE — Progress Notes (Signed)
PATIENT: Tina Saunders DOB: 1986/01/15  No chief complaint on file.    HISTORICAL  Tina Saunders is a 33 year old female, seen in request by her primary care PA Cyndi Bender, for evaluation of seizure, initial evaluation was on August 29, 2018.  She is accompanied by her family care manager Mendel Corning.  She has lived in current facility since January 2020, before that, she lives by herself  I have reviewed and summarized the referring note from the referring physician.  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  She presented with seizure in November 2018, was admitted to [facility], reviewed MRI of the brain with without contrast on December 11, 2016 that was normal  Before January 2020, she lives alone, but had frequent recurrent seizure, was not compliant with her medications, there was safety concerns, per manager, her mood is relatively stable, she has recurrent seizure-like spells couple times each month, sudden onset of body tremor, transient loss of consciousness, eyes rolling back, she had one episode this morning, she was sitting in her wheelchair had sudden onset of body shaking, patient denied warning signs, she slide out of the chair, lost consciousness for less than 1 minute, no tongue biting, no loss of bladder and bowel incontinence.  She is taking Keppra 1000 mg twice a day, Topamax 100 mg every night,  Laboratory evaluations, hemoglobin was 11.4, normal CMP  REVIEW OF SYSTEMS: Full 14 system review of systems performed and notable only for as above All other review of systems were negative.  ALLERGIES: Allergies  Allergen Reactions  .  Fish-Derived Products Anaphylaxis    Can only eat FLounder  . Geodon [Ziprasidone Hcl] Other (See Comments)    Face pulls, cant swallow - Locked Jaw  . Haldol [Haloperidol Lactate] Other (See Comments)    Face pulls, can't swallow - Locked Jaw  . Buprenorphine Hcl Hives, Itching, Rash and Other (See Comments)    GI upset  . Compazine [Prochlorperazine] Other (See Comments)    anxiety and hyperactivity  . Morphine And Related Hives, Itching, Rash and Other (See Comments)    GI upset  . Toradol [Ketorolac Tromethamine] Other (See Comments)    Anxiety and hyperactivity    HOME MEDICATIONS: Current Outpatient Medications  Medication Sig Dispense Refill  . atorvastatin (LIPITOR) 40 MG tablet Take 1 tablet (40 mg total) by mouth at bedtime. For high cholesterol 30 tablet 0  . bisacodyl (DULCOLAX) 5 MG EC tablet Take 2 tablets (10 mg total) by mouth daily as needed for moderate constipation. (May buy from over the counter): For constipation 30 tablet 0  . busPIRone (BUSPAR) 15 MG tablet Take 1 tablet (15 mg total) by mouth 3 (three) times daily. For anxiety 90 tablet 0  . chlorproMAZINE (THORAZINE) 50 MG tablet Take 1 tablet (50 mg total) by mouth 3 (three) times daily. For mood control 90 tablet 0  . dicyclomine (BENTYL) 20 MG tablet Take 1 tablet (20 mg total) by mouth 2 (two) times daily for 7 days. 14 tablet 0  . gabapentin (NEURONTIN) 300 MG capsule Take 2 capsules (600 mg total) by mouth 3 (three) times daily. For agitation 180 capsule 0  . insulin aspart (NOVOLOG) 100 UNIT/ML injection Inject 30 Units into  the skin 3 (three) times daily with meals. For diabetes management 10 mL 0  . insulin detemir (LEVEMIR) 100 UNIT/ML injection Inject 0.7 mLs (70 Units total) into the skin 2 (two) times daily. For diabetes management 10 mL 0  . Insulin Syringe-Needle U-100 25G X 1" 1 ML MISC 1 Syringe by Does not apply route 4 (four) times daily -  before meals and at bedtime. For blood sugar checks 1  each 0  . levETIRAcetam (KEPPRA) 500 MG tablet Take 1 tablet (500 mg total) by mouth 2 (two) times daily. For seizure activities 1 tablet 0  . magnesium oxide (MAG-OX) 400 (241.3 Mg) MG tablet Take 1 tablet (400 mg total) by mouth daily. For acid rflux    . Melatonin 3 MG TABS Take 1 tablet (3 mg total) by mouth at bedtime. For sleep 30 tablet 0  . metoprolol tartrate (LOPRESSOR) 25 MG tablet Take 1 tablet (25 mg total) by mouth 2 (two) times daily. For high blood pressure 60 tablet 0  . miconazole (MICONAZOLE 3) 200 MG vaginal suppository Place 1 suppository (200 mg total) vaginally at bedtime. 3 suppository 0  . ondansetron (ZOFRAN) 4 MG tablet Take 1 tablet (4 mg total) by mouth every 8 (eight) hours as needed for nausea or vomiting. 15 tablet 0  . prazosin (MINIPRESS) 1 MG capsule Take 1 capsule (1 mg total) by mouth at bedtime. For nightmares 30 capsule 0  . QUEtiapine (SEROQUEL) 100 MG tablet Take 1 tablet (100 mg total) by mouth daily. For mood control 30 tablet 0  . QUEtiapine (SEROQUEL) 200 MG tablet Take 2.5 tablets (500 mg total) by mouth at bedtime. For mood control 45 tablet 1  . sertraline (ZOLOFT) 100 MG tablet Take 2 tablets (200 mg total) by mouth daily. Fordepression 60 tablet 0  . sulfamethoxazole-trimethoprim (BACTRIM,SEPTRA) 400-80 MG tablet Take 1 tablet by mouth every 12 (twelve) hours. For uti (Patient not taking: Reported on 01/30/2018) 5 tablet 0  . topiramate (TOPAMAX) 100 MG tablet Take 1 tablet (100 mg total) by mouth at bedtime. For mood stabilization 30 tablet 0   No current facility-administered medications for this visit.     PAST MEDICAL HISTORY: Past Medical History:  Diagnosis Date  . Anxiety   . Asthma   . Bipolar 1 disorder ([place])   . Cancer of abdominal wall   . Depression   . Diabetes mellitus without complication ([place])   . High cholesterol   . Hypertension   . Intentional drug overdose ([place]) 07/26/2017   Seroquel and buspirone   . Obesity   .  Obesity   . Polycystic ovarian syndrome 07/01/2011   Patient report  . Rhabdosarcoma ([place])   . Schizophrenia ([place])   . Seizures ([place])     PAST SURGICAL HISTORY: Past Surgical History:  Procedure Laterality Date  . CHOLECYSTECTOMY    . COLONOSCOPY WITH PROPOFOL N/A 03/08/2017   Procedure: COLONOSCOPY WITH PROPOFOL;  Surgeon: Milus Banister, MD;  Location: [facility] END[facility]COPY;  Service: Endoscopy;  Laterality: N/A;  . HERNIA REPAIR    . Ovarian Cyst Excision    . VARICOSE VEIN SURGERY      FAMILY HISTORY: Family History  Problem Relation Age of Onset  . Coronary artery disease Maternal Grandmother   . Diabetes type II Maternal Grandmother   . Cancer Maternal Grandmother   . Hypertension Mother   . Hypertension Father     SOCIAL HISTORY: Social History   Socioeconomic History  . Marital status:  Single    Spouse name: Not on file  . Number of children: 0  . Years of education: Not on file  . Highest education level: Not on file  Occupational History  . Not on file  Social Needs  . Financial resource strain: Not on file  . Food insecurity    Worry: Not on file    Inability: Not on file  . Transportation needs    Medical: Not on file    Non-medical: Not on file  Tobacco Use  . Smoking status: Never Smoker  . Smokeless tobacco: Never Used  Substance and Sexual Activity  . Alcohol use: No  . Drug use: No  . Sexual activity: Never    Birth control/protection: None  Lifestyle  . Physical activity    Days per week: Not on file    Minutes per session: Not on file  . Stress: Not on file  Relationships  . Social Herbalist on phone: Not on file    Gets together: Not on file    Attends religious service: Not on file    Active member of club or organization: Not on file    Attends meetings of clubs or organizations: Not on file    Relationship status: Not on file  . Intimate partner violence    Fear of current or ex partner: Not on file    Emotionally abused:  Not on file    Physically abused: Not on file    Forced sexual activity: Not on file  Other Topics Concern  . Not on file  Social History Narrative  . Not on file     PHYSICAL EXAM   Vitals:   08/29/18 0905  BP: 130/85  Pulse: 78  Temp: 98 F (36.7 C)    Not recorded      There is no height or weight on file to calculate BMI.  PHYSICAL EXAMNIATION:  Gen: NAD, conversant, well nourised, obese, well groomed                     Cardiovascular: Regular rate rhythm, no peripheral edema, warm, nontender. Eyes: Conjunctivae clear without exudates or hemorrhage Neck: Supple, no carotid bruits. Pulmonary: Clear to auscultation bilaterally   NEUROLOGICAL EXAM:  MENTAL STATUS: Speech:    Speech is normal; fluent and spontaneous with normal comprehension.  Cognition:     Orientation to time, place and person     Normal recent and remote memory     Normal Attention span and concentration     Normal Language, naming, repeating,spontaneous speech     Fund of knowledge   CRANIAL NERVES: CN II: Visual fields are full to confrontation.  Pupils are round equal and briskly reactive to light. CN III, IV, VI: extraocular movement are normal. No ptosis. CN V: Facial sensation is intact to pinprick in all 3 divisions bilaterally. Corneal responses are intact.  CN VII: Face is symmetric with normal eye closure and smile. CN VIII: Hearing is normal to rubbing fingers CN IX, X: Palate elevates symmetrically. Phonation is normal. CN XI: Head turning and shoulder shrug are intact CN XII: Tongue is midline with normal movements and no atrophy.  MOTOR: There is no pronator drift of out-stretched arms. Muscle bulk and tone are normal. Muscle strength is normal.  REFLEXES: Reflexes are 2+ and symmetric at the biceps, triceps, knees, and ankles. Plantar responses are flexor.  SENSORY: Intact to light touch, pinprick, positional sensation and vibratory sensation  are intact in fingers and  toes.  COORDINATION: Rapid alternating movements and fine finger movements are intact. There is no dysmetria on finger-to-nose and heel-knee-shin.    GAIT/STANCE: Obese, need to push on chair arm, limited because of body habitus   DIAGNOSTIC DATA (LABS, IMAGING, TESTING) - I reviewed patient records, labs, notes, testing and imaging myself where available.   ASSESSMENT AND PLAN  KIYLEE THORESON is a 33 y.o. female   Seizure  History of rhabdomyosarcoma, status post surgical resection, chemotherapy,  Proceed with MRI of the brain with and without contrast  EEG  Start keppra 1000mg  twice a day, stop Topamax 100mg  qhs  Add on lamotrigine titrating to 100 mg twice a day   Marcial Pacas, M.D. Ph.D.  [facility] [address], [name], [place] [address] Ph: [phone] Fax: [phone]  CC: Cyndi Bender, PA-C

## 2018-08-29 NOTE — Telephone Encounter (Signed)
[name] Josem Kaufmann: [phone] (exp. 08/29/18 to 09/28/18)/medicaid order sent to [facility]. They will reach out to the patient to schedule.

## 2018-09-09 ENCOUNTER — Telehealth: Payer: Self-pay | Admitting: Neurology

## 2018-09-09 NOTE — Addendum Note (Signed)
Addended by: Desmond Lope on: 09/09/2018 09:59 AM   Modules accepted: Orders

## 2018-09-09 NOTE — Telephone Encounter (Signed)
I returned the call to Melody at [facility].  She is one of the staff members responsible for the administration of this patient's medications.  States they have been giving the patient Lamictal 100mg  BID for eight days now.  They did not titrate her dose up with the 25mg  tablets.  She tells me the patient has not experienced any adverse side effects.  No rash present either.  Dr. Krista Blue is aware of this issue and would like the patient to continue the 100mg  BID since she is already tolerating the higher dosage.  Melody verbalized understanding.

## 2018-09-09 NOTE — Telephone Encounter (Signed)
Melody @ [facility] has called, she is asking for a call from RN to discuss how pt is to take her lamoTRIgine (LAMICTAL) 25 MG tablet

## 2018-09-10 DIAGNOSIS — E0840 Diabetes mellitus due to underlying condition with diabetic neuropathy, unspecified: Secondary | ICD-10-CM | POA: Diagnosis not present

## 2018-09-10 DIAGNOSIS — B351 Tinea unguium: Secondary | ICD-10-CM | POA: Diagnosis not present

## 2018-09-10 DIAGNOSIS — M2041 Other hammer toe(s) (acquired), right foot: Secondary | ICD-10-CM | POA: Diagnosis not present

## 2018-09-12 ENCOUNTER — Ambulatory Visit (INDEPENDENT_AMBULATORY_CARE_PROVIDER_SITE_OTHER): Payer: Medicare HMO | Admitting: Neurology

## 2018-09-12 ENCOUNTER — Other Ambulatory Visit: Payer: Self-pay

## 2018-09-12 DIAGNOSIS — R569 Unspecified convulsions: Secondary | ICD-10-CM

## 2018-09-12 DIAGNOSIS — Z79899 Other long term (current) drug therapy: Secondary | ICD-10-CM

## 2018-09-19 NOTE — Procedures (Signed)
   HISTORY: 33 years old female, presented with seizure  TECHNIQUE:  This is a routine 16 channel EEG recording with one channel devoted to a limited EKG recording.  It was performed during wakefulness, drowsiness and asleep.  Hyperventilation and photic stimulation were performed as activating procedures.  There are frequent bilateral frontal electrode artifact.  Upon maximum arousal, posterior dominant waking rhythm consistent of mild moderate dysrhythmic theta range activity 6 to 7 Hz.  Activities are symmetric over the bilateral posterior derivations and attenuated with eye opening.  Hyperventilation produced mild/moderate buildup with higher amplitude and the slower activities noted.  Photic stimulation did not alter the tracing.  During EEG recording, patient developed drowsiness and no deeper stage of sleep was achieved. During EEG recording, there was no epileptiform discharge noted.  EKG demonstrate sinus rhythm, with heart rate of 84 bpm  CONCLUSION: This is an abnormal awake EEG.  There is no electrodiagnostic evidence of mild to moderate background slowing, there is no evidence of epileptiform discharge.  Common etiology metabolic toxic, she is on polypharmacy treatment.  Marcial Pacas, M.D. Ph.D.  [facility] [place], [place] [address] Phone: [phone] Fax:      [phone]

## 2018-10-11 ENCOUNTER — Other Ambulatory Visit: Payer: Medicare HMO

## 2018-10-29 ENCOUNTER — Ambulatory Visit: Payer: Medicare HMO | Admitting: Neurology

## 2018-10-29 DIAGNOSIS — N939 Abnormal uterine and vaginal bleeding, unspecified: Secondary | ICD-10-CM | POA: Diagnosis not present

## 2018-11-11 ENCOUNTER — Ambulatory Visit
Admission: RE | Admit: 2018-11-11 | Discharge: 2018-11-11 | Disposition: A | Discharge status: Home / Self Care | Payer: Medicare HMO | Source: Ambulatory Visit | Attending: Neurology | Admitting: Neurology

## 2018-11-11 ENCOUNTER — Other Ambulatory Visit: Payer: Self-pay

## 2018-11-11 DIAGNOSIS — R569 Unspecified convulsions: Secondary | ICD-10-CM | POA: Diagnosis not present

## 2018-11-11 DIAGNOSIS — Z79899 Other long term (current) drug therapy: Secondary | ICD-10-CM

## 2018-11-11 MED ORDER — GADOBENATE DIMEGLUMINE 529 MG/ML IV SOLN
20.0000 mL | Freq: Once | INTRAVENOUS | Status: AC | PRN
  Administered 2018-11-11: 12:00:00 20 mL via INTRAVENOUS

## 2018-11-12 DIAGNOSIS — F259 Schizoaffective disorder, unspecified: Secondary | ICD-10-CM | POA: Diagnosis not present

## 2018-11-12 DIAGNOSIS — R569 Unspecified convulsions: Secondary | ICD-10-CM | POA: Diagnosis not present

## 2018-11-12 DIAGNOSIS — E1165 Type 2 diabetes mellitus with hyperglycemia: Secondary | ICD-10-CM | POA: Diagnosis not present

## 2018-11-12 DIAGNOSIS — Z6841 Body Mass Index (BMI) 40.0 and over, adult: Secondary | ICD-10-CM | POA: Diagnosis not present

## 2018-11-12 DIAGNOSIS — E7800 Pure hypercholesterolemia, unspecified: Secondary | ICD-10-CM | POA: Diagnosis not present

## 2018-11-12 DIAGNOSIS — Z79899 Other long term (current) drug therapy: Secondary | ICD-10-CM | POA: Diagnosis not present

## 2018-11-12 DIAGNOSIS — Z23 Encounter for immunization: Secondary | ICD-10-CM | POA: Diagnosis not present

## 2018-11-12 NOTE — Progress Notes (Signed)
PATIENT: [name] DOB: 1985/04/06  REASON FOR VISIT: follow up HISTORY FROM: patient  HISTORY OF PRESENT ILLNESS: Today 11/13/18  HISTORY  Nashly TEIGHAN PUZA is a 33 year old female, seen in request by her primary care PA Cyndi Bender, for evaluation of seizure, initial evaluation was on August 29, 2018.  She is accompanied by her family care manager Mendel Corning.  She has lived in current facility since January 2020, before that, she lives by herself  I have reviewed and summarized the referring note from the referring physician.  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  She presented with seizure in November 2018, was admitted to [facility], reviewed MRI of the brain with without contrast on December 11, 2016 that was normal  Before January 2020, she lives alone, but had frequent recurrent seizure, was not compliant with her medications, there was safety concerns, per manager, her mood is relatively stable, she has recurrent seizure-like spells couple times each month, sudden onset of body tremor, transient loss of consciousness, eyes rolling back, she had one episode this morning, she was sitting in her wheelchair had sudden onset of body shaking, patient denied warning signs, she slide out of the chair, lost consciousness for less than 1 minute, no tongue biting, no loss of bladder and bowel incontinence.  She is taking Keppra 1000 mg twice a day, Topamax 100 mg every night,  Laboratory evaluations, hemoglobin was 11.4, normal CMP  Update November 13, 2018 SS: EEG 09/12/2018 mildly abnormal, evidence of mild to moderate background slowing, no  evidence of epileptiform discharge.   MRI of the brain 11/11/2018 was normal with and without contrast.  She is here today with Malachy Mood, her caregiver from her assisted living facility, [facility].  Since last seen, she has had 2 seizures.  The most recent occurred 10/29/2018, at 1215 pm the staff heard in the kitchen, found her on the floor, no report of shaking, the record indicates 15 minutes later she was given ibuprofen for headache, was sitting in the chair watching TV, came around quickly.  Prior to that, she had a seizure in August, staff heard a thud, found her on the floor, she had a nose bleed, reports shaking all over.  They report a decrease in seizure frequency while taking Lamictal and Keppra.  Her psychiatrist has decreased her dose of Thorazine.  Her A1c was 14 in January, is now 7.  Overall, doing well, tolerating medications without side effect.  REVIEW OF SYSTEMS: Out of a complete 14 system review of symptoms, the patient complains only of the following symptoms, and all other reviewed systems are negative.  Seizures  ALLERGIES: Allergies  Allergen Reactions  . Fish-Derived Products Anaphylaxis    Can only eat FLounder  . Geodon [Ziprasidone Hcl] Other (See Comments)    Face pulls, cant swallow - Locked Jaw  . Haldol [Haloperidol Lactate] Other (See Comments)    Face pulls, can't swallow - Locked Jaw  . Buprenorphine Hcl Hives, Itching, Rash and Other (See Comments)    GI upset  . Compazine [Prochlorperazine] Other (See Comments)    anxiety and hyperactivity  . Morphine And Related Hives, Itching, Rash and Other (See Comments)    GI upset  . Toradol [  Ketorolac Tromethamine] Other (See Comments)    Anxiety and hyperactivity    HOME MEDICATIONS: Outpatient Medications Prior to Visit  Medication Sig Dispense Refill  . atorvastatin (LIPITOR) 40 MG tablet Take 1 tablet (40 mg total) by mouth at bedtime. For high cholesterol 30 tablet 0  . bisacodyl (DULCOLAX)  5 MG EC tablet Take 2 tablets (10 mg total) by mouth daily as needed for moderate constipation. (May buy from over the counter): For constipation 30 tablet 0  . busPIRone (BUSPAR) 30 MG tablet Take 45 mg by mouth 3 (three) times daily.    . chlorproMAZINE (THORAZINE) 10 MG tablet Take 10 mg by mouth 3 (three) times daily.    Mariane Baumgarten Sodium (DSS) 100 MG CAPS Take 1 (one) Tablet two times daily, as needed for constipation    . gabapentin (NEURONTIN) 400 MG capsule Take 400 mg by mouth 3 (three) times daily.    . insulin aspart (NOVOLOG) 100 UNIT/ML injection Inject 30 Units into the skin 3 (three) times daily with meals. For diabetes management (Patient taking differently: Inject 40 Units into the skin 3 (three) times daily with meals. For diabetes management) 10 mL 0  . insulin detemir (LEVEMIR) 100 UNIT/ML injection Inject 0.7 mLs (70 Units total) into the skin 2 (two) times daily. For diabetes management (Patient taking differently: Inject 95 Units into the skin 2 (two) times daily. For diabetes management) 10 mL 0  . Insulin Syringe-Needle U-100 25G X 1" 1 ML MISC 1 Syringe by Does not apply route 4 (four) times daily -  before meals and at bedtime. For blood sugar checks 1 each 0  . lamoTRIgine (LAMICTAL) 100 MG tablet Take 1 tablet (100 mg total) by mouth 2 (two) times daily. 180 tablet 4  . levETIRAcetam (KEPPRA) 1000 MG tablet Take 1 tablet (1,000 mg total) by mouth 2 (two) times daily. 180 tablet 4  . lisinopril (ZESTRIL) 10 MG tablet Take 10 mg by mouth daily.    . magnesium oxide (MAG-OX) 400 (241.3 Mg) MG tablet Take 1 tablet (400 mg total) by mouth daily. For acid rflux    . Melatonin 3 MG TABS Take 1 tablet (3 mg total) by mouth at bedtime. For sleep 30 tablet 0  . metoprolol tartrate (LOPRESSOR) 25 MG tablet Take 1 tablet (25 mg total) by mouth 2 (two) times daily. For high blood pressure 60 tablet 0  . norethindrone (MICRONOR) 0.35 MG tablet Take by mouth.    . pantoprazole (PROTONIX)  40 MG tablet Take 40 mg by mouth daily.    . QUEtiapine (SEROQUEL XR) 200 MG 24 hr tablet Take 450 mg by mouth at bedtime.    Marland Kitchen QUEtiapine (SEROQUEL) 100 MG tablet Take 1 tablet (100 mg total) by mouth daily. For mood control 30 tablet 0  . sertraline (ZOLOFT) 100 MG tablet Take 2 tablets (200 mg total) by mouth daily. Fordepression 60 tablet 0  . traZODone (DESYREL) 50 MG tablet Take 50 mg by mouth at bedtime.     No facility-administered medications prior to visit.     PAST MEDICAL HISTORY: Past Medical History:  Diagnosis Date  . Anxiety   . Asthma   . Bipolar 1 disorder ([place])   . Cancer of abdominal wall   . Depression   . Diabetes mellitus without complication ([place])   . High cholesterol   . Hypertension   . Intentional drug overdose ([place]) 07/26/2017   Seroquel and buspirone   . Obesity   .  Obesity   . Polycystic ovarian syndrome 07/01/2011   Patient report  . Rhabdosarcoma ([place])   . Schizophrenia ([place])   . Seizures ([place])     PAST SURGICAL HISTORY: Past Surgical History:  Procedure Laterality Date  . CHOLECYSTECTOMY    . COLONOSCOPY WITH PROPOFOL N/A 03/08/2017   Procedure: COLONOSCOPY WITH PROPOFOL;  Surgeon: Milus Banister, MD;  Location: [facility] ENDOSCOPY;  Service: Endoscopy;  Laterality: N/A;  . HERNIA REPAIR    . Ovarian Cyst Excision    . VARICOSE VEIN SURGERY      FAMILY HISTORY: Family History  Problem Relation Age of Onset  . Coronary artery disease Maternal Grandmother   . Diabetes type II Maternal Grandmother   . Cancer Maternal Grandmother   . Hypertension Mother   . Hypertension Father     SOCIAL HISTORY: Social History   Socioeconomic History  . Marital status: Single    Spouse name: Not on file  . Number of children: 0  . Years of education: Not on file  . Highest education level: Not on file  Occupational History  . Not on file  Social Needs  . Financial resource strain: Not on file  . Food insecurity    Worry: Not on file     Inability: Not on file  . Transportation needs    Medical: Not on file    Non-medical: Not on file  Tobacco Use  . Smoking status: Never Smoker  . Smokeless tobacco: Never Used  Substance and Sexual Activity  . Alcohol use: No  . Drug use: No  . Sexual activity: Never    Birth control/protection: None  Lifestyle  . Physical activity    Days per week: Not on file    Minutes per session: Not on file  . Stress: Not on file  Relationships  . Social Herbalist on phone: Not on file    Gets together: Not on file    Attends religious service: Not on file    Active member of club or organization: Not on file    Attends meetings of clubs or organizations: Not on file    Relationship status: Not on file  . Intimate partner violence    Fear of current or ex partner: Not on file    Emotionally abused: Not on file    Physically abused: Not on file    Forced sexual activity: Not on file  Other Topics Concern  . Not on file  Social History Narrative  . Not on file   PHYSICAL EXAM  Vitals:   11/13/18 1446  BP: 130/78  Pulse: 93  Temp: 98.7 F (37.1 C)  Weight: 238 lb (108 kg)  Height: 5\' 4"  (1.626 m)   Body mass index is 40.85 kg/m.  Generalized: Well developed, in no acute distress, obese Neurological examination  Mentation: Alert oriented to time, place, history taking. Follows all commands speech and language fluent Cranial nerve II-XII: Pupils were equal round reactive to light. Extraocular movements were full, visual field were full on confrontational test. Facial sensation and strength were normal.  Head turning and shoulder shrug  were normal and symmetric. Motor: The motor testing reveals 5 over 5 strength of all 4 extremities. Good symmetric motor tone is noted throughout.  Sensory: Sensory testing is intact to soft touch on all 4 extremities. No evidence of extinction is noted.  Coordination: Cerebellar testing reveals good finger-nose-finger and  heel-to-shin bilaterally.  Gait and station: Gait is normal.  Tandem gait is normal.  Reflexes: Deep tendon reflexes are symmetric and normal bilaterally.   DIAGNOSTIC DATA (LABS, IMAGING, TESTING) - I reviewed patient records, labs, notes, testing and imaging myself where available.  Lab Results  Component Value Date   WBC 3.4 (L) 01/30/2018   HGB 11.0 (L) 01/30/2018   HCT 35.6 (L) 01/30/2018   MCV 80.5 01/30/2018   PLT 100 (L) 01/30/2018      Component Value Date/Time   NA 132 (L) 01/30/2018 1742   NA 139 07/13/2011 0025   K 3.8 01/30/2018 1742   K 4.3 07/13/2011 0025   CL 102 01/30/2018 1742   CL 106 07/13/2011 0025   CO2 20 (L) 01/30/2018 1742   CO2 25 07/13/2011 0025   GLUCOSE 421 (H) 01/30/2018 1742   GLUCOSE 105 (H) 07/13/2011 0025   BUN 5 (L) 01/30/2018 1742   BUN 9 07/13/2011 0025   CREATININE 0.52 01/30/2018 1742   CREATININE 0.68 07/13/2011 0025   CALCIUM 8.7 (L) 01/30/2018 1742   CALCIUM 8.8 07/13/2011 0025   PROT 7.1 01/30/2018 1742   PROT 7.9 07/13/2011 0025   ALBUMIN 3.3 (L) 01/30/2018 1742   ALBUMIN 3.5 07/13/2011 0025   AST 37 01/30/2018 1742   AST 57 (H) 07/13/2011 0025   ALT 27 01/30/2018 1742   ALT 38 07/13/2011 0025   ALKPHOS 85 01/30/2018 1742   ALKPHOS 101 07/13/2011 0025   BILITOT 0.6 01/30/2018 1742   BILITOT 0.2 07/13/2011 0025   GFRNONAA >60 01/30/2018 1742   GFRNONAA >60 07/13/2011 0025   GFRAA >60 01/30/2018 1742   GFRAA >60 07/13/2011 0025   Lab Results  Component Value Date   CHOL 113 11/03/2016   HDL 19 (L) 11/03/2016   LDLCALC 21 11/03/2016   TRIG 366 (H) 11/03/2016   CHOLHDL 5.9 11/03/2016   Lab Results  Component Value Date   HGBA1C 12.4 (H) 07/26/2017   No results found for: VITAMINB12 Lab Results  Component Value Date   TSH 1.589 11/02/2016     ASSESSMENT AND PLAN 33 y.o. year old female  has a past medical history of Anxiety, Asthma, Bipolar 1 disorder ([place]), Cancer of abdominal wall, Depression, Diabetes  mellitus without complication ([place]), High cholesterol, Hypertension, Intentional drug overdose ([place]) (07/26/2017), Obesity, Obesity, Polycystic ovarian syndrome (07/01/2011), Rhabdosarcoma ([place]), Schizophrenia ([place]), and Seizures ([place]). here with:  1.  Seizure -History of rhabdomyosarcoma, status post surgical resection, chemotherapy -MRI of the brain with and without contrast was normal 11/11/2018 -EEG was mildly abnormal, evidence of mild to moderate background slowing, no evidence of epileptiform discharge -Has had 2 seizures since last seen, I will check a Lamictal level, likely will increase Lamictal to 150 mg twice a day, continue Keppra 1000 mg twice a day -Had routine lab work done at primary yesterday, will get the report -Follow-up in 6 months or sooner if needed  I spent 15 minutes with the patient. 50% of this time was spent discussing her plan of care.  Butler Denmark, AGNP-C, DNP 11/13/2018, 2:48 PM [facility] [address] [place], [place] [address] [phone]

## 2018-11-13 ENCOUNTER — Ambulatory Visit (INDEPENDENT_AMBULATORY_CARE_PROVIDER_SITE_OTHER): Payer: Medicare HMO | Admitting: Neurology

## 2018-11-13 ENCOUNTER — Other Ambulatory Visit: Payer: Self-pay

## 2018-11-13 ENCOUNTER — Telehealth: Payer: Self-pay | Admitting: Neurology

## 2018-11-13 ENCOUNTER — Encounter: Payer: Self-pay | Admitting: Neurology

## 2018-11-13 VITALS — BP 130/78 | HR 93 | Temp 98.7°F | Ht 64.0 in | Wt 238.0 lb

## 2018-11-13 DIAGNOSIS — R569 Unspecified convulsions: Secondary | ICD-10-CM | POA: Diagnosis not present

## 2018-11-13 NOTE — Patient Instructions (Signed)
1. I will check lab work today, we will likely increase the Lamictal once the blood level returns 2. Follow-up in 6 months

## 2018-11-13 NOTE — Progress Notes (Signed)
I have reviewed and agreed above plan. 

## 2018-11-13 NOTE — Telephone Encounter (Signed)
Called PCP, Cyndi Bender PA's office and spoke with Dorian Pod. She will fax lab results. Received labs, placed on NP's desk for review.

## 2018-11-13 NOTE — Telephone Encounter (Signed)
Can you try to get routine lab work done from PCP yesterday?

## 2018-11-14 ENCOUNTER — Telehealth: Payer: Self-pay | Admitting: Neurology

## 2018-11-14 LAB — LAMOTRIGINE LEVEL: Lamotrigine Lvl: 2.4 ug/mL (ref 2.0–20.0)

## 2018-11-14 NOTE — Telephone Encounter (Signed)
I received recent lab work for the patient from 11/12/2018.  CBC was unremarkable, CMP creatinine 0.38, normal AST, ALT. Hemoglobin A1c 7.2.

## 2018-11-18 ENCOUNTER — Telehealth: Payer: Self-pay | Admitting: Neurology

## 2018-11-18 MED ORDER — LAMOTRIGINE 100 MG PO TABS: ORAL_TABLET | ORAL | 1 refills | Status: DC

## 2018-11-18 NOTE — Telephone Encounter (Signed)
Spoke to melody, caregiver for pt.  I relayed that lamictal level and the instructions for increasing lamictal for pt, to maintenance 150mg  po bid.  I faxed to [facility] pharmacy with confirmation.  I faxed over note to [facility] [phone].

## 2018-11-18 NOTE — Telephone Encounter (Signed)
Please call the patient. Lamictal level was 2.4. We will increase the medication due to report of continued seizures, 2 since last seen. Have her increase Lamictal by taking 100 mg in the morning, 150 mg in the evening for 2 weeks, then increase to 150 mg twice a day. I have printed and signed the updated prescription.

## 2018-11-19 ENCOUNTER — Ambulatory Visit: Payer: Medicare HMO | Admitting: Neurology

## 2018-11-27 DIAGNOSIS — R440 Auditory hallucinations: Secondary | ICD-10-CM | POA: Diagnosis not present

## 2018-11-27 DIAGNOSIS — F209 Schizophrenia, unspecified: Secondary | ICD-10-CM | POA: Diagnosis not present

## 2018-11-27 DIAGNOSIS — R442 Other hallucinations: Secondary | ICD-10-CM | POA: Diagnosis not present

## 2018-11-27 DIAGNOSIS — R0902 Hypoxemia: Secondary | ICD-10-CM | POA: Diagnosis not present

## 2018-11-27 DIAGNOSIS — F29 Unspecified psychosis not due to a substance or known physiological condition: Secondary | ICD-10-CM | POA: Diagnosis not present

## 2018-11-27 DIAGNOSIS — Z79899 Other long term (current) drug therapy: Secondary | ICD-10-CM | POA: Diagnosis not present

## 2018-11-27 DIAGNOSIS — R45851 Suicidal ideations: Secondary | ICD-10-CM | POA: Diagnosis not present

## 2018-11-27 DIAGNOSIS — Z915 Personal history of self-harm: Secondary | ICD-10-CM | POA: Diagnosis not present

## 2018-12-12 NOTE — Telephone Encounter (Signed)
Melody called back and is needing copy of order for pt for there records.  I did refax order to her (prescription).

## 2018-12-13 DIAGNOSIS — R32 Unspecified urinary incontinence: Secondary | ICD-10-CM | POA: Diagnosis not present

## 2018-12-23 ENCOUNTER — Telehealth: Payer: Self-pay | Admitting: Neurology

## 2018-12-23 MED ORDER — LAMOTRIGINE 150 MG PO TABS: 150.0000 mg | ORAL_TABLET | Freq: Every day | ORAL | 1 refills | Status: DC

## 2018-12-23 NOTE — Addendum Note (Signed)
Addended by: Brandon Melnick on: 12/23/2018 04:57 PM   Modules accepted: Orders

## 2018-12-23 NOTE — Telephone Encounter (Signed)
caretaker of pt Tina Saunders is asking for a call to discuss pt's caretaker of pt lamoTRIgine (LAMICTAL) 100 MG tablet

## 2018-12-23 NOTE — Telephone Encounter (Signed)
I called Tina Saunders.  Asking if can get the 150mg  tablets vs cutting 100mg  in half for the 50mg .  I told her that would be ok.  I went ahead and placed the order and cancelled the other one.

## 2018-12-24 DIAGNOSIS — M2041 Other hammer toe(s) (acquired), right foot: Secondary | ICD-10-CM | POA: Diagnosis not present

## 2018-12-24 DIAGNOSIS — E0840 Diabetes mellitus due to underlying condition with diabetic neuropathy, unspecified: Secondary | ICD-10-CM | POA: Diagnosis not present

## 2018-12-24 DIAGNOSIS — M7731 Calcaneal spur, right foot: Secondary | ICD-10-CM | POA: Diagnosis not present

## 2018-12-24 DIAGNOSIS — L84 Corns and callosities: Secondary | ICD-10-CM | POA: Diagnosis not present

## 2018-12-24 DIAGNOSIS — B351 Tinea unguium: Secondary | ICD-10-CM | POA: Diagnosis not present

## 2018-12-24 DIAGNOSIS — M2011 Hallux valgus (acquired), right foot: Secondary | ICD-10-CM | POA: Diagnosis not present

## 2018-12-26 ENCOUNTER — Telehealth: Payer: Self-pay | Admitting: *Deleted

## 2018-12-26 MED ORDER — LAMOTRIGINE 150 MG PO TABS: 150.0000 mg | ORAL_TABLET | Freq: Two times a day (BID) | ORAL | 1 refills | Status: DC

## 2018-12-26 NOTE — Telephone Encounter (Signed)
I called and spoke to [facility] at [facility].  I relayed that new prescription placed lamotrigine 150mg  po bid #90 day supply with one refill).  SS/NP is aware. Melissa verbalized understanding.

## 2018-12-26 NOTE — Telephone Encounter (Signed)
I received call from [place] at [facility].  Received refill on lamotrigine and received from prevo only 30 tablets (stating on prescription to take only one daily.  I relayed that is a mistake on my part.  She is to take 150mg  po bid. Will redo the prescription to prev[facility] drug with the correct instructions.  (pt has kept taking dosage of 150mg  po bid).  I will let Lenna Sciara know when this is done.  Prescription done for 150mg  po bid #180 with 1 refill to prevo.  This is to let you know.

## 2019-01-14 DIAGNOSIS — R32 Unspecified urinary incontinence: Secondary | ICD-10-CM | POA: Diagnosis not present

## 2019-01-31 DIAGNOSIS — K5900 Constipation, unspecified: Secondary | ICD-10-CM | POA: Diagnosis not present

## 2019-01-31 DIAGNOSIS — Z593 Problems related to living in residential institution: Secondary | ICD-10-CM | POA: Diagnosis not present

## 2019-01-31 DIAGNOSIS — E7800 Pure hypercholesterolemia, unspecified: Secondary | ICD-10-CM | POA: Diagnosis not present

## 2019-01-31 DIAGNOSIS — G40909 Epilepsy, unspecified, not intractable, without status epilepticus: Secondary | ICD-10-CM | POA: Diagnosis not present

## 2019-01-31 DIAGNOSIS — E1140 Type 2 diabetes mellitus with diabetic neuropathy, unspecified: Secondary | ICD-10-CM | POA: Diagnosis not present

## 2019-01-31 DIAGNOSIS — E1165 Type 2 diabetes mellitus with hyperglycemia: Secondary | ICD-10-CM | POA: Diagnosis not present

## 2019-01-31 DIAGNOSIS — Z6841 Body Mass Index (BMI) 40.0 and over, adult: Secondary | ICD-10-CM | POA: Diagnosis not present

## 2019-01-31 DIAGNOSIS — Z794 Long term (current) use of insulin: Secondary | ICD-10-CM | POA: Diagnosis not present

## 2019-01-31 DIAGNOSIS — Z79899 Other long term (current) drug therapy: Secondary | ICD-10-CM | POA: Diagnosis not present

## 2019-02-03 DIAGNOSIS — Z0100 Encounter for examination of eyes and vision without abnormal findings: Secondary | ICD-10-CM | POA: Diagnosis not present

## 2019-02-03 DIAGNOSIS — Z794 Long term (current) use of insulin: Secondary | ICD-10-CM | POA: Diagnosis not present

## 2019-02-03 DIAGNOSIS — Z7984 Long term (current) use of oral hypoglycemic drugs: Secondary | ICD-10-CM | POA: Diagnosis not present

## 2019-02-03 DIAGNOSIS — E119 Type 2 diabetes mellitus without complications: Secondary | ICD-10-CM | POA: Diagnosis not present

## 2019-02-07 DIAGNOSIS — R32 Unspecified urinary incontinence: Secondary | ICD-10-CM | POA: Diagnosis not present

## 2019-02-08 ENCOUNTER — Other Ambulatory Visit: Payer: Self-pay

## 2019-02-08 ENCOUNTER — Emergency Department: Payer: Medicare HMO

## 2019-02-08 ENCOUNTER — Emergency Department
Admission: EM | Admit: 2019-02-08 | Discharge: 2019-02-09 | Disposition: A | Discharge status: Home / Self Care | Payer: Medicare HMO | Attending: Emergency Medicine | Admitting: Emergency Medicine

## 2019-02-08 DIAGNOSIS — F251 Schizoaffective disorder, depressive type: Secondary | ICD-10-CM | POA: Diagnosis present

## 2019-02-08 DIAGNOSIS — E1165 Type 2 diabetes mellitus with hyperglycemia: Secondary | ICD-10-CM | POA: Diagnosis not present

## 2019-02-08 DIAGNOSIS — Z794 Long term (current) use of insulin: Secondary | ICD-10-CM | POA: Insufficient documentation

## 2019-02-08 DIAGNOSIS — E119 Type 2 diabetes mellitus without complications: Secondary | ICD-10-CM

## 2019-02-08 DIAGNOSIS — F6089 Other specific personality disorders: Secondary | ICD-10-CM | POA: Diagnosis not present

## 2019-02-08 DIAGNOSIS — M7989 Other specified soft tissue disorders: Secondary | ICD-10-CM | POA: Diagnosis not present

## 2019-02-08 DIAGNOSIS — F4325 Adjustment disorder with mixed disturbance of emotions and conduct: Secondary | ICD-10-CM | POA: Diagnosis not present

## 2019-02-08 DIAGNOSIS — I10 Essential (primary) hypertension: Secondary | ICD-10-CM | POA: Diagnosis not present

## 2019-02-08 DIAGNOSIS — R739 Hyperglycemia, unspecified: Secondary | ICD-10-CM

## 2019-02-08 DIAGNOSIS — F329 Major depressive disorder, single episode, unspecified: Secondary | ICD-10-CM | POA: Diagnosis present

## 2019-02-08 DIAGNOSIS — Z79899 Other long term (current) drug therapy: Secondary | ICD-10-CM | POA: Insufficient documentation

## 2019-02-08 DIAGNOSIS — F29 Unspecified psychosis not due to a substance or known physiological condition: Secondary | ICD-10-CM | POA: Diagnosis not present

## 2019-02-08 DIAGNOSIS — Z20822 Contact with and (suspected) exposure to covid-19: Secondary | ICD-10-CM | POA: Insufficient documentation

## 2019-02-08 DIAGNOSIS — J45909 Unspecified asthma, uncomplicated: Secondary | ICD-10-CM | POA: Insufficient documentation

## 2019-02-08 DIAGNOSIS — R358 Other polyuria: Secondary | ICD-10-CM | POA: Diagnosis not present

## 2019-02-08 DIAGNOSIS — R2242 Localized swelling, mass and lump, left lower limb: Secondary | ICD-10-CM | POA: Insufficient documentation

## 2019-02-08 DIAGNOSIS — R45851 Suicidal ideations: Secondary | ICD-10-CM

## 2019-02-08 LAB — CBC WITH DIFFERENTIAL/PLATELET
Abs Immature Granulocytes: 0.02 10*3/uL (ref 0.00–0.07)
Basophils Absolute: 0 10*3/uL (ref 0.0–0.1)
Basophils Relative: 1 %
Eosinophils Absolute: 0.2 10*3/uL (ref 0.0–0.5)
Eosinophils Relative: 4 %
HCT: 34.4 % — ABNORMAL LOW (ref 36.0–46.0)
Hemoglobin: 10.5 g/dL — ABNORMAL LOW (ref 12.0–15.0)
Immature Granulocytes: 1 %
Lymphocytes Relative: 23 %
Lymphs Abs: 0.9 10*3/uL (ref 0.7–4.0)
MCH: 24.6 pg — ABNORMAL LOW (ref 26.0–34.0)
MCHC: 30.5 g/dL (ref 30.0–36.0)
MCV: 80.6 fL (ref 80.0–100.0)
Monocytes Absolute: 0.2 10*3/uL (ref 0.1–1.0)
Monocytes Relative: 5 %
Neutro Abs: 2.7 10*3/uL (ref 1.7–7.7)
Neutrophils Relative %: 66 %
Platelets: 110 10*3/uL — ABNORMAL LOW (ref 150–400)
RBC: 4.27 MIL/uL (ref 3.87–5.11)
RDW: 16.6 % — ABNORMAL HIGH (ref 11.5–15.5)
WBC: 4 10*3/uL (ref 4.0–10.5)
nRBC: 0 % (ref 0.0–0.2)

## 2019-02-08 LAB — COMPREHENSIVE METABOLIC PANEL
ALT: 34 U/L (ref 0–44)
AST: 39 U/L (ref 15–41)
Albumin: 3.9 g/dL (ref 3.5–5.0)
Alkaline Phosphatase: 125 U/L (ref 38–126)
Anion gap: 8 (ref 5–15)
BUN: 8 mg/dL (ref 6–20)
CO2: 27 mmol/L (ref 22–32)
Calcium: 8.8 mg/dL — ABNORMAL LOW (ref 8.9–10.3)
Chloride: 98 mmol/L (ref 98–111)
Creatinine, Ser: 0.62 mg/dL (ref 0.44–1.00)
GFR calc Af Amer: >60 mL/min (ref >60)
GFR calc non Af Amer: >60 mL/min (ref >60)
Glucose, Bld: 415 mg/dL — ABNORMAL HIGH (ref 70–99)
Potassium: 4.1 mmol/L (ref 3.5–5.1)
Sodium: 133 mmol/L — ABNORMAL LOW (ref 135–145)
Total Bilirubin: 0.6 mg/dL (ref 0.3–1.2)
Total Protein: 7.4 g/dL (ref 6.5–8.1)

## 2019-02-08 LAB — URINALYSIS, ROUTINE W REFLEX MICROSCOPIC
Bilirubin Urine: NEGATIVE
Glucose, UA: >=500 mg/dL — AB
Ketones, ur: 5 mg/dL — AB
Nitrite: NEGATIVE
Protein, ur: NEGATIVE mg/dL
Specific Gravity, Urine: 1.037 — ABNORMAL HIGH (ref 1.005–1.030)
pH: 5 (ref 5.0–8.0)

## 2019-02-08 LAB — URINE DRUG SCREEN, QUALITATIVE (ARMC ONLY)
Amphetamines, Ur Screen: NOT DETECTED
Barbiturates, Ur Screen: NOT DETECTED
Benzodiazepine, Ur Scrn: NOT DETECTED
Cannabinoid 50 Ng, Ur ~~LOC~~: NOT DETECTED
Cocaine Metabolite,Ur ~~LOC~~: NOT DETECTED
MDMA (Ecstasy)Ur Screen: NOT DETECTED
Methadone Scn, Ur: NOT DETECTED
Opiate, Ur Screen: NOT DETECTED
Phencyclidine (PCP) Ur S: NOT DETECTED
Tricyclic, Ur Screen: POSITIVE — AB

## 2019-02-08 LAB — GLUCOSE, CAPILLARY
Glucose-Capillary: 386 mg/dL — ABNORMAL HIGH (ref 70–99)
Glucose-Capillary: 350 mg/dL — ABNORMAL HIGH (ref 70–99)

## 2019-02-08 LAB — ETHANOL: Alcohol, Ethyl (B): <10 mg/dL (ref <10)

## 2019-02-08 IMAGING — CT CT ABD-PELV W/ CM
2 of 4 series · 16 of 46 positions shown, 18 images · IV contrast (ISOVUE)
Comparison: 10/26/2016

CLINICAL DATA: Abdominal pain

EXAM:
CT ABDOMEN AND PELVIS WITH CONTRAST
TECHNIQUE: Multidetector CT imaging of the abdomen and pelvis was performed
using the standard protocol following bolus administration of
intravenous contrast.
CONTRAST:  100mL BW05EJ-1PP IOPAMIDOL (BW05EJ-1PP) INJECTION 61%

[Series 2: axial st · axial · 0.92mm/px · z∈[-495,-80]mm · 13 of 95 slices shown, 15 images]
[im 6/95  soft-tissue]
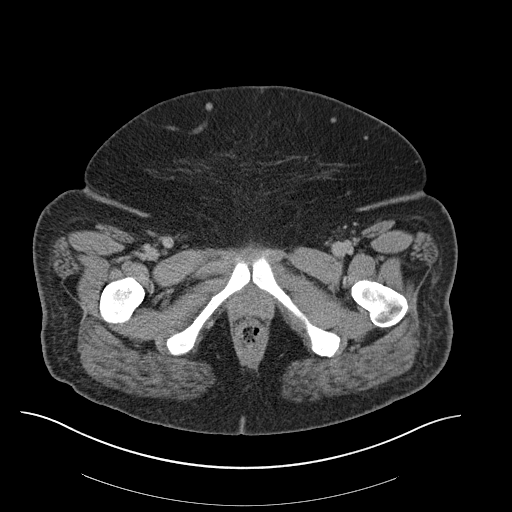
[im 6/95  bone]
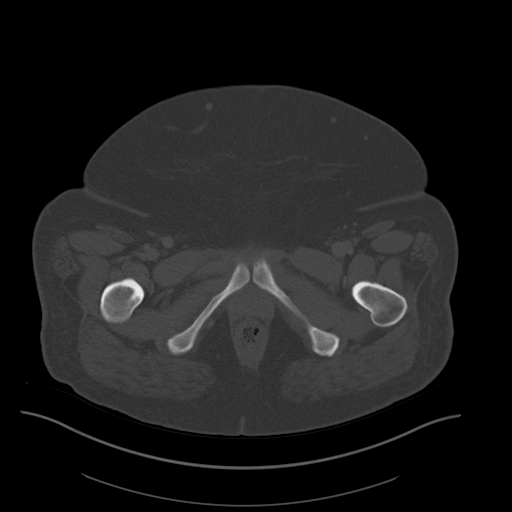
[im 11/95  soft-tissue]
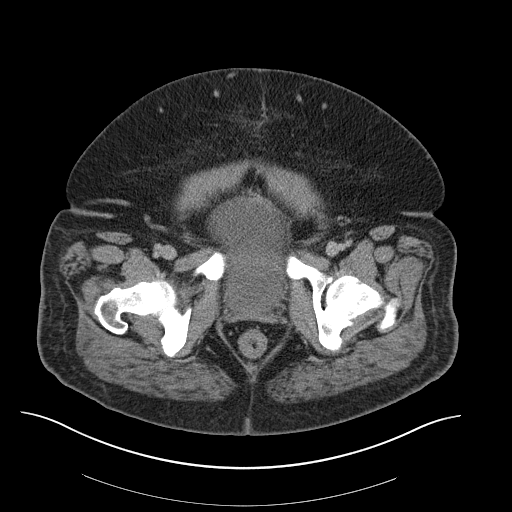
[im 21/95  soft-tissue]
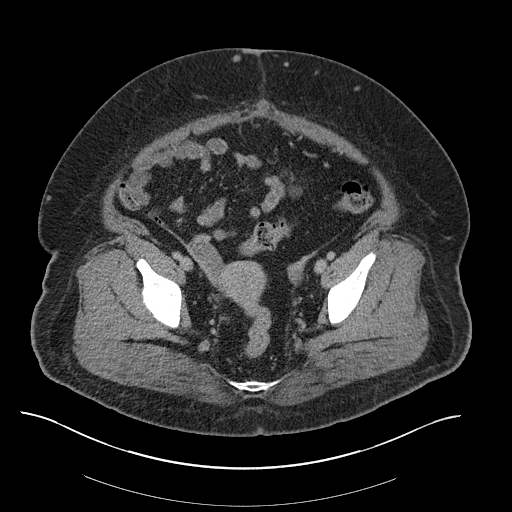
[im 27/95  soft-tissue]
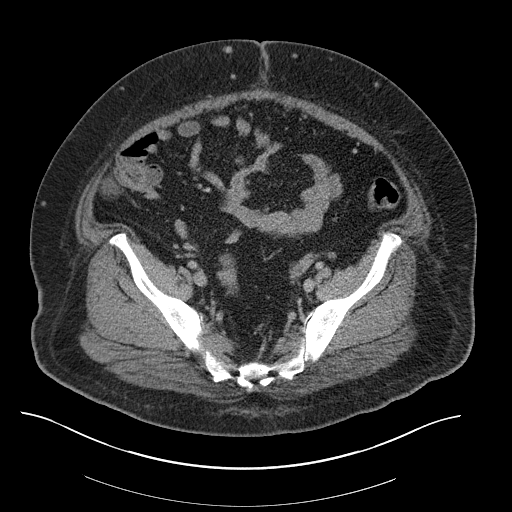
[im 32/95  soft-tissue]
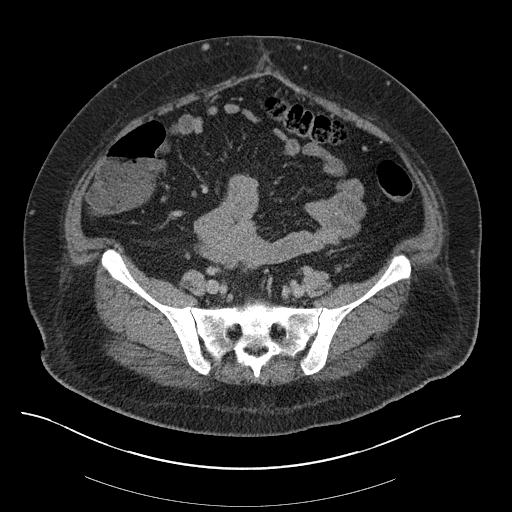
[im 42/95  soft-tissue]
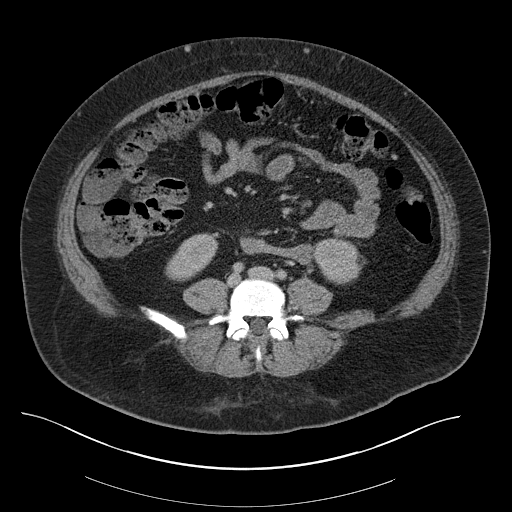
[im 48/95  soft-tissue]
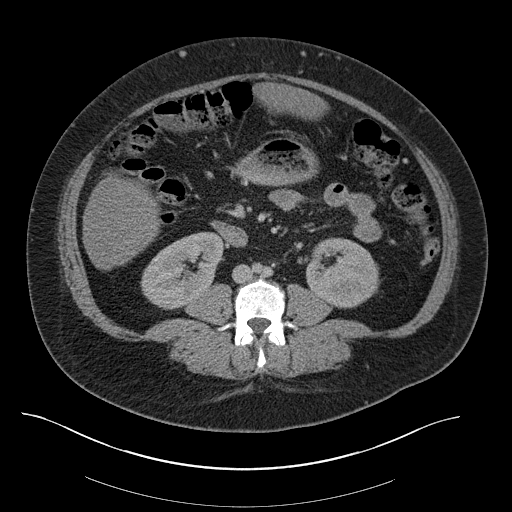
[im 53/95  soft-tissue]
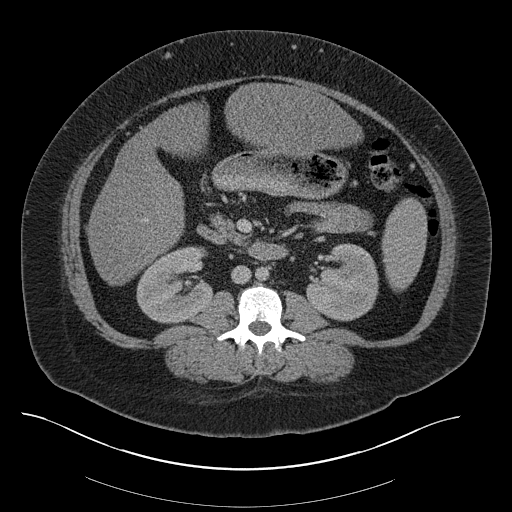
[im 63/95  soft-tissue]
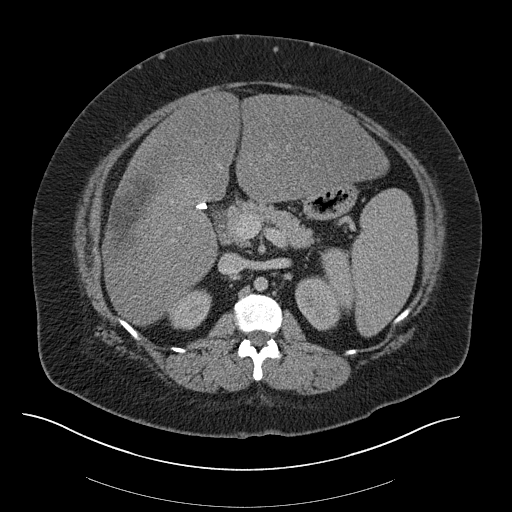
[im 63/95  bone]
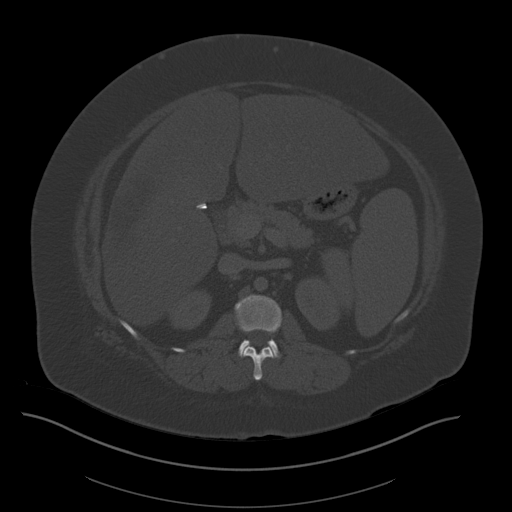
[im 68/95  soft-tissue]
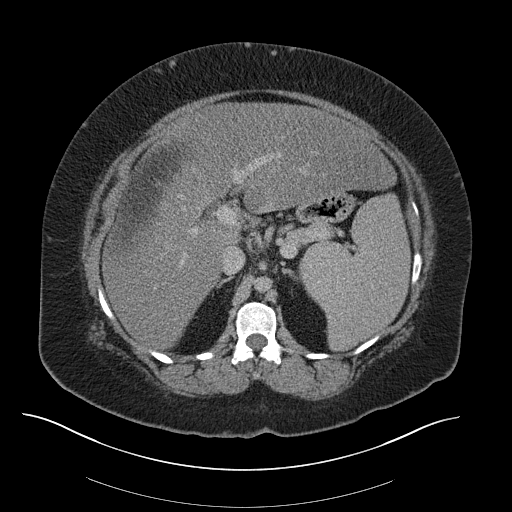
[im 74/95  soft-tissue]
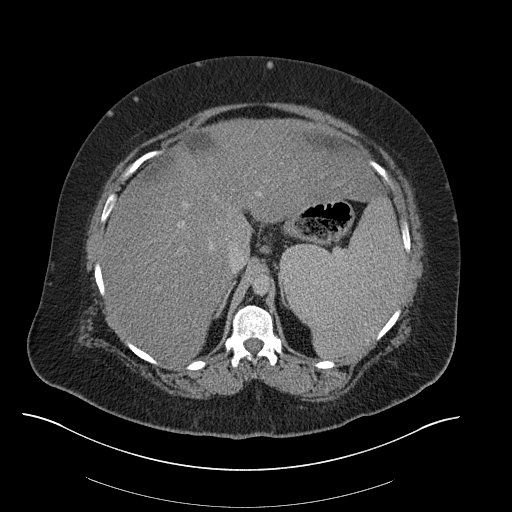
[im 84/95  soft-tissue]
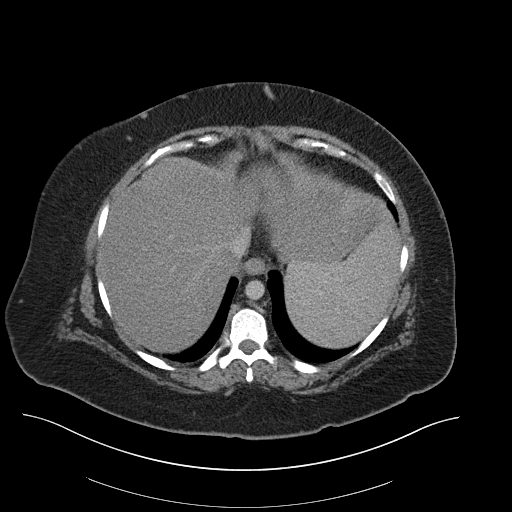
[im 89/95  soft-tissue]
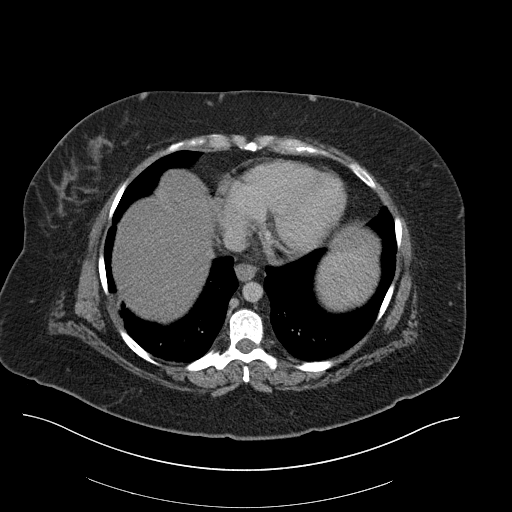

[Series 5: coronal st · coronal · 0.81mm/px · 3 of 121 slices shown]
[im 41/121  soft-tissue]
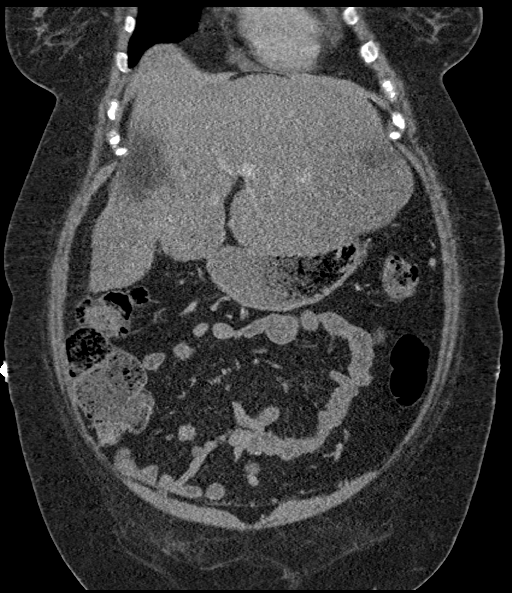
[im 54/121  soft-tissue]
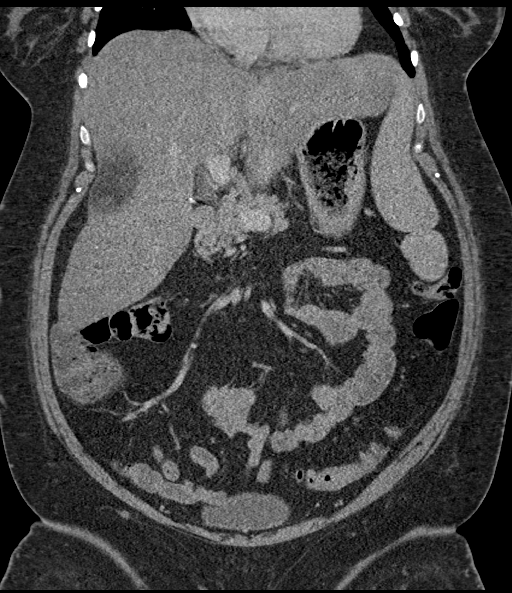
[im 67/121  soft-tissue]
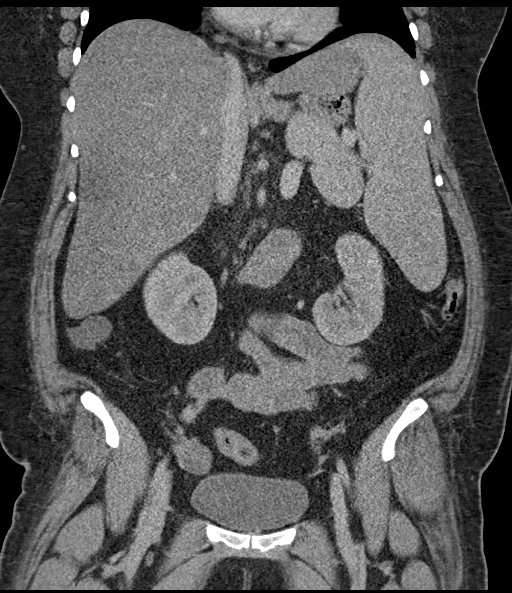

[16 of 46 positions shown; findings below may reference images not displayed]

FINDINGS: Lower chest: Heart is borderline in size.  Lung bases clear.

Hepatobiliary: Diffuse low-density throughout the liver compatible
with fatty infiltration. More severe low-density noted anteriorly in
both the right and left hepatic lobes suggesting more severe focal
fatty infiltration. No focal abnormality. Prior cholecystectomy

Pancreas: No focal abnormality or ductal dilatation.

Spleen: Splenomegaly. Craniocaudal length of the spleen measures 21
cm.

Adrenals/Urinary Tract: No adrenal abnormality. No focal renal
abnormality. No stones or hydronephrosis. Urinary bladder is
unremarkable.

Stomach/Bowel: Stomach, large and small bowel grossly unremarkable.

Vascular/Lymphatic: No evidence of aneurysm or adenopathy.

Reproductive: Uterus and adnexa unremarkable.  No mass.

Other: No free fluid or free air.

Musculoskeletal: No acute findings.
IMPRESSION: Severe diffuse fatty infiltration of the liver with more severe
focal fatty infiltration anteriorly in both the right and left
hepatic lobes.

Associated marked splenomegaly.

## 2019-02-08 MED ORDER — QUETIAPINE FUMARATE ER 50 MG PO TB24
450.0000 mg | ORAL_TABLET | Freq: Every day | ORAL | Status: DC
  Administered 2019-02-08: 450 mg via ORAL
  Filled 2019-02-08 (×2): qty 1

## 2019-02-08 MED ORDER — LAMOTRIGINE 25 MG PO TABS
150.0000 mg | ORAL_TABLET | Freq: Two times a day (BID) | ORAL | Status: DC
  Administered 2019-02-08 – 2019-02-09 (×2): 150 mg via ORAL
  Filled 2019-02-08 (×2): qty 1

## 2019-02-08 MED ORDER — FOSFOMYCIN TROMETHAMINE 3 G PO PACK
3.0000 g | PACK | Freq: Once | ORAL | Status: AC
  Administered 2019-02-08: 22:00:00 3 g via ORAL
  Filled 2019-02-08: qty 3

## 2019-02-08 MED ORDER — CHLORPROMAZINE HCL 10 MG PO TABS
10.0000 mg | ORAL_TABLET | Freq: Three times a day (TID) | ORAL | Status: DC
  Administered 2019-02-08 – 2019-02-09 (×2): 10 mg via ORAL
  Filled 2019-02-08 (×4): qty 1

## 2019-02-08 MED ORDER — INSULIN ASPART 100 UNIT/ML ~~LOC~~ SOLN
SUBCUTANEOUS | Status: AC
  Filled 2019-02-08: qty 1

## 2019-02-08 MED ORDER — LEVETIRACETAM 500 MG PO TABS
1000.0000 mg | ORAL_TABLET | Freq: Two times a day (BID) | ORAL | Status: DC
  Administered 2019-02-08 – 2019-02-09 (×2): 1000 mg via ORAL
  Filled 2019-02-08 (×2): qty 2

## 2019-02-08 MED ORDER — QUETIAPINE FUMARATE 25 MG PO TABS
100.0000 mg | ORAL_TABLET | Freq: Every day | ORAL | Status: DC
  Administered 2019-02-09: 11:00:00 100 mg via ORAL
  Filled 2019-02-08: qty 4

## 2019-02-08 MED ORDER — SERTRALINE HCL 50 MG PO TABS
200.0000 mg | ORAL_TABLET | Freq: Every day | ORAL | Status: DC
  Administered 2019-02-09: 11:00:00 200 mg via ORAL
  Filled 2019-02-08: qty 4

## 2019-02-08 MED ORDER — PANTOPRAZOLE SODIUM 40 MG PO TBEC
40.0000 mg | DELAYED_RELEASE_TABLET | Freq: Every day | ORAL | Status: DC
  Administered 2019-02-09: 11:00:00 40 mg via ORAL
  Filled 2019-02-08: qty 1

## 2019-02-08 MED ORDER — NORETHINDRONE 0.35 MG PO TABS: 1.0000 | ORAL_TABLET | Freq: Every day | ORAL | Status: DC

## 2019-02-08 MED ORDER — ATORVASTATIN CALCIUM 20 MG PO TABS
40.0000 mg | ORAL_TABLET | Freq: Every day | ORAL | Status: DC
  Administered 2019-02-08: 40 mg via ORAL
  Filled 2019-02-08: qty 2

## 2019-02-08 MED ORDER — BUSPIRONE HCL 5 MG PO TABS
45.0000 mg | ORAL_TABLET | Freq: Three times a day (TID) | ORAL | Status: DC
  Administered 2019-02-08: 45 mg via ORAL
  Filled 2019-02-08 (×2): qty 9

## 2019-02-08 MED ORDER — LISINOPRIL 10 MG PO TABS
10.0000 mg | ORAL_TABLET | Freq: Every day | ORAL | Status: DC
  Administered 2019-02-09: 10:00:00 10 mg via ORAL
  Filled 2019-02-08: qty 1

## 2019-02-08 MED ORDER — GABAPENTIN 100 MG PO CAPS: 400.0000 mg | ORAL_CAPSULE | Freq: Three times a day (TID) | ORAL | Status: DC

## 2019-02-08 MED ORDER — MELATONIN 5 MG PO TABS
2.5000 mg | ORAL_TABLET | Freq: Every day | ORAL | Status: DC
  Administered 2019-02-08: 2.5 mg via ORAL
  Filled 2019-02-08 (×2): qty 0.5

## 2019-02-08 MED ORDER — INSULIN DETEMIR 100 UNIT/ML ~~LOC~~ SOLN
95.0000 [IU] | Freq: Two times a day (BID) | SUBCUTANEOUS | Status: DC
  Administered 2019-02-09 (×2): 95 [IU] via SUBCUTANEOUS
  Filled 2019-02-08 (×3): qty 0.95

## 2019-02-08 MED ORDER — INSULIN ASPART 100 UNIT/ML ~~LOC~~ SOLN
40.0000 [IU] | Freq: Three times a day (TID) | SUBCUTANEOUS | Status: DC
  Administered 2019-02-08 – 2019-02-09 (×2): 40 [IU] via SUBCUTANEOUS
  Filled 2019-02-08: qty 1

## 2019-02-08 MED ORDER — METOPROLOL TARTRATE 25 MG PO TABS
25.0000 mg | ORAL_TABLET | Freq: Two times a day (BID) | ORAL | Status: DC
  Administered 2019-02-08 – 2019-02-09 (×2): 25 mg via ORAL
  Filled 2019-02-08 (×2): qty 1
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
## 2019-02-08 NOTE — ED Notes (Signed)
Pt changed into behavioral clothing by this EDT Tech, the following items obtained from pt personal possession and placed in a pt belongings bag and placed at [employer]. Pt does have one piece of paper with her that contains her previous caregiver's number Remi Haggard), pt states that she recently lived in a assisted living facility "[facility]" and she would only like to speak with Remi Haggard and information be given to her. RN informed  1 blue sweat shirt, 1 purple shirt, 1 pair of black Capri pants, 1 pair of rainbow flip flops, 1 yellow and blue bag that contains 3 changes of clothes, a pocketbook and a cell phone. Per pt nothing of value is in pocketbook
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
## 2019-02-08 NOTE — ED Triage Notes (Signed)
Pt states she is suicidal to EMS, plan to take all of her medications.

## 2019-02-08 NOTE — ED Notes (Signed)
Pt reports "hearing things," commands to hurt self"

## 2019-02-08 NOTE — ED Provider Notes (Signed)
[facility] Emergency Department Provider Note  ____________________________________________   First MD Initiated Contact with Patient 02/08/19 2006     (approximate)  I have reviewed the triage vital signs and the nursing notes.   HISTORY  Chief Complaint Suicidal    HPI Tina Saunders is a 34 y.o. female  With h/o schizoaffective d/o, HTN, HLD, DM, PCOS, here with multiple complaints. Pt reports that she just moved out of her group home to live with her mother. She was reportedly convinced of this by her mother and thought it was a good idea. Since returning, she states she had no idea that where she lived had no power or running water, and that her mother has taken her disability saved money. She subsequently feels worthless, hopeless, and has a plan to kill herself via OD. History of prior suicide attempts. She did not actually harm herself. She also incidentally notes swelling in her L leg and reports a ho DVT. No CP, SOB. No fevers.       Past Medical History:  Diagnosis Date  . Anxiety   . Asthma   . Bipolar 1 disorder ([place])   . Cancer of abdominal wall   . Depression   . Diabetes mellitus without complication ([place])   . High cholesterol   . Hypertension   . Intentional drug overdose ([place]) 07/26/2017   Seroquel and buspirone   . Obesity   . Obesity   . Polycystic ovarian syndrome 07/01/2011   Patient report  . 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 Schizophrenia ([place])   . Seizures [facility])     Patient Active Problem List   Diagnosis Date Noted  . Polypharmacy 08/29/2018  . 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 Adjustment disorder with mixed disturbance of emotions and conduct   . QT prolongation   . Suicide attempt ([place])   . Overdose of antidepressant, intentional self-harm, initial encounter ([place])   . Overdose 07/26/2017  . Suicide  attempt by other psychotropic drug overdose ([place])   . DKA (diabetic ketoacidoses) ([place]) 03/14/2017  . Chronic constipation   . Seizures ([place]) 12/11/2016  . Diabetes mellitus type 2 in obese ([place]) 12/11/2016  . Uncontrolled diabetes mellitus ([place]) 11/03/2016  . Schizoaffective disorder, bipolar type ([place]) 11/02/2016  . Cluster B personality disorder ([place]) 11/02/2016  . Suicidal ideation   . 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 HTN (hypertension) 04/15/2013  . Posttraumatic stress disorder 12/07/2011  . PSVT (paroxysmal supraventricular tachycardia) ([place]) 08/26/2011  . ADHD 09/23/2007  . EPIGASTRIC PAIN 09/23/2007  . Obesity, unspecified 07/30/2007  . Schizoaffective disorder, depressive type ([place]) 07/30/2007  . SLEEP DISORDER 07/30/2007  . METRORRHAGIA 06/13/2006  . DISORDER, MENSTRUAL NEC 06/13/2006  . POLYCYSTIC OVARIAN DISEASE 04/25/2006  . AMENORRHEA, SECONDARY 04/20/2006  . ACNE, MILD 04/20/2006    Past Surgical History:  Procedure Laterality Date  . CHOLECYSTECTOMY    . COLONOSCOPY WITH PROPOFOL N/A 03/08/2017   Procedure: COLONOSCOPY WITH PROPOFOL;  Surgeon: Milus Banister, MD;  Location: [facility] ENDOSCOPY;  Service: Endoscopy;  Laterality: N/A;  . HERNIA REPAIR    . Ovarian Cyst Excision    . VARICOSE VEIN SURGERY      Prior to Admission medications   Medication Sig Start Date End Date Taking? Authorizing Provider  atorvastatin (LIPITOR) 40 MG tablet Take 1 tablet (40 mg total) by mouth at bedtime. For high cholesterol  12/06/17  Yes Lindell Spar I, NP  bisacodyl (DULCOLAX) 5 MG EC tablet Take 2 tablets (10 mg total) by mouth daily as needed for moderate constipation. (May buy from over the counter): For constipation 12/06/17  Yes Nwoko, Herbert Pun I, NP  busPIRone (BUSPAR) 30 MG tablet Take 45 mg by mouth 3 (three) times daily.   Yes [provider]  chlorproMAZINE (THORAZINE) 25 MG tablet Take 25 mg by mouth 3 (three) times daily. 01/27/19  Yes [provider]   Docusate Sodium (DSS) 100 MG CAPS Take 1 (one) Tablet two times daily, as needed for constipation 08/15/18  Yes [provider]  gabapentin (NEURONTIN) 400 MG capsule Take 400 mg by mouth 3 (three) times daily.   Yes [provider]  insulin aspart (NOVOLOG) 100 UNIT/ML injection Inject 30 Units into the skin 3 (three) times daily with meals. For diabetes management Patient taking differently: Inject 40 Units into the skin 3 (three) times daily with meals. For diabetes management 12/06/17  Yes Lindell Spar I, NP  insulin detemir (LEVEMIR) 100 UNIT/ML injection Inject 0.7 mLs (70 Units total) into the skin 2 (two) times daily. For diabetes management Patient taking differently: Inject 95 Units into the skin 2 (two) times daily. For diabetes management 12/06/17  Yes Lindell Spar I, NP  Insulin Syringe-Needle U-100 25G X 1" 1 ML MISC 1 Syringe by Does not apply route 4 (four) times daily -  before meals and at bedtime. For blood sugar checks 12/06/17  Yes Nwoko, Herbert Pun I, NP  lamoTRIgine (LAMICTAL) 150 MG tablet Take 1 tablet (150 mg total) by mouth 2 (two) times daily. 12/26/18  Yes Suzzanne Cloud, NP  levETIRAcetam (KEPPRA) 1000 MG tablet Take 1 tablet (1,000 mg total) by mouth 2 (two) times daily. 08/29/18  Yes Marcial Pacas, MD  lisinopril (ZESTRIL) 10 MG tablet Take 10 mg by mouth daily.   Yes [provider]  magnesium oxide (MAG-OX) 400 (241.3 Mg) MG tablet Take 1 tablet (400 mg total) by mouth daily. For acid rflux 12/07/17  Yes Nwoko, Herbert Pun I, NP  Melatonin 3 MG TABS Take 1 tablet (3 mg total) by mouth at bedtime. For sleep 12/06/17  Yes Lindell Spar I, NP  metoprolol tartrate (LOPRESSOR) 25 MG tablet Take 1 tablet (25 mg total) by mouth 2 (two) times daily. For high blood pressure 12/06/17  Yes Nwoko, Agnes I, NP  norethindrone (MICRONOR) 0.35 MG tablet Take 1 tablet by mouth daily.  10/29/18 02/08/19 Yes [provider]  pantoprazole (PROTONIX) 40 MG tablet Take 40  mg by mouth daily.   Yes [provider]  QUEtiapine (SEROQUEL XR) 200 MG 24 hr tablet Take 450 mg by mouth at bedtime.   Yes [provider]  QUEtiapine (SEROQUEL) 100 MG tablet Take 1 tablet (100 mg total) by mouth daily. For mood control 12/06/17  Yes Lindell Spar I, NP  sertraline (ZOLOFT) 100 MG tablet Take 2 tablets (200 mg total) by mouth daily. Fordepression 12/07/17  Yes Lindell Spar I, NP  TOUJEO MAX SOLOSTAR 300 UNIT/ML SOPN  12/26/18   [provider]  traZODone (DESYREL) 50 MG tablet Take 50 mg by mouth at bedtime.    [provider]    Allergies Fish-derived products, Geodon [ziprasidone hcl], Haldol [haloperidol lactate], Buprenorphine hcl, Compazine [prochlorperazine], Morphine and related, and Toradol [ketorolac tromethamine]  Family History  Problem Relation Age of Onset  . Coronary artery disease Maternal Grandmother   . Diabetes type II Maternal Grandmother   .  Cancer Maternal Grandmother   . Hypertension Mother   . Hypertension Father     Social History Social History   Tobacco Use  . Smoking status: Never Smoker  . Smokeless tobacco: Never Used  Substance Use Topics  . Alcohol use: No  . Drug use: No    Review of Systems  Review of Systems  Constitutional: Positive for fatigue. Negative for fever.  HENT: Negative for congestion and sore throat.   Eyes: Negative for visual disturbance.  Respiratory: Negative for cough and shortness of breath.   Cardiovascular: Negative for chest pain.  Gastrointestinal: Negative for abdominal pain, diarrhea, nausea and vomiting.  Endocrine: Positive for polyuria.  Genitourinary: Negative for flank pain.  Musculoskeletal: Negative for back pain and neck pain.  Skin: Negative for rash and wound.  Neurological: Negative for weakness.  Psychiatric/Behavioral: Positive for dysphoric mood and suicidal ideas. The patient is nervous/anxious.   All other systems reviewed and are negative.     ____________________________________________  PHYSICAL EXAM:      VITAL SIGNS: ED Triage Vitals  Enc Vitals Group     BP 02/08/19 1926 (!) 141/72     Pulse Rate 02/08/19 1926 88     Resp 02/08/19 1926 16     Temp 02/08/19 1926 98 F (36.7 C)     Temp Source 02/08/19 1926 Oral     SpO2 02/08/19 1918 96 %     Weight 02/08/19 1929 245 lb (111.1 kg)     Height 02/08/19 1929 5\' 4"  (1.626 m)     Head Circumference --      Peak Flow --      Pain Score 02/08/19 1928 0     Pain Loc --      Pain Edu? --      Excl. in [place]? --      Physical Exam Vitals and nursing note reviewed.  Constitutional:      General: She is not in acute distress.    Appearance: She is well-developed. She is not ill-appearing.  HENT:     Head: Normocephalic and atraumatic.     Mouth/Throat:     Mouth: Mucous membranes are moist.  Eyes:     Conjunctiva/sclera: Conjunctivae normal.  Cardiovascular:     Rate and Rhythm: Normal rate and regular rhythm.     Heart sounds: Normal heart sounds.  Pulmonary:     Effort: Pulmonary effort is normal. No respiratory distress.     Breath sounds: No wheezing.  Abdominal:     General: There is no distension.  Musculoskeletal:     Cervical back: Neck supple.  Skin:    General: Skin is warm.     Capillary Refill: Capillary refill takes less than 2 seconds.     Findings: No rash.  Neurological:     Mental Status: She is alert and oriented to person, place, and time.     Motor: No abnormal muscle tone.  Psychiatric:        Mood and Affect: Mood is anxious and depressed.        Behavior: Behavior is withdrawn.        Thought Content: Thought content includes suicidal ideation.        Judgment: Judgment is impulsive.       ____________________________________________   LABS (all labs ordered are listed, but only abnormal results are displayed)  Labs Reviewed  GLUCOSE, CAPILLARY - Abnormal; Notable for the following components:      Result Value  Glucose-Capillary 386 (*)    All other components within normal limits  COMPREHENSIVE METABOLIC PANEL - Abnormal; Notable for the following components:   Sodium 133 (*)    Glucose, Bld 415 (*)    Calcium 8.8 (*)    All other components within normal limits  URINE DRUG SCREEN, QUALITATIVE ([facility] ONLY) - Abnormal; Notable for the following components:   Tricyclic, Ur Screen POSITIVE (*)    All other components within normal limits  CBC WITH DIFFERENTIAL/PLATELET - Abnormal; Notable for the following components:   Hemoglobin 10.5 (*)    HCT 34.4 (*)    MCH 24.6 (*)    RDW 16.6 (*)    Platelets 110 (*)    All other components within normal limits  URINALYSIS, ROUTINE W REFLEX MICROSCOPIC - Abnormal; Notable for the following components:   Color, Urine YELLOW (*)    APPearance HAZY (*)    Specific Gravity, Urine 1.037 (*)    Glucose, UA >=500 (*)    Hgb urine dipstick SMALL (*)    Ketones, ur 5 (*)    Leukocytes,Ua MODERATE (*)    Bacteria, UA FEW (*)    All other components within normal limits  GLUCOSE, CAPILLARY - Abnormal; Notable for the following components:   Glucose-Capillary 350 (*)    All other components within normal limits  RESPIRATORY PANEL BY RT PCR (FLU A&B, COVID)  URINE CULTURE  ETHANOL  POC URINE PREG, ED  CBG MONITORING, ED  CBG MONITORING, ED  CBG MONITORING, ED  CBG MONITORING, ED    ____________________________________________  EKG: Normal sinus rhythm, VR 88. PR 174, QRS 102, QTc 474. No acute ST elevations or depressions. Inferior Q waves noted. ________________________________________  RADIOLOGY All imaging, including plain films, CT scans, and ultrasounds, independently reviewed by me, and interpretations confirmed via formal radiology reads.  ED MD interpretation:   DVT study LLE: Negative  Official radiology report(s): US Venous Img Lower Unilateral Left  Result Date: 02/08/2019 CLINICAL DATA:  34 year old female with left leg swelling.  EXAM: Left LOWER EXTREMITY VENOUS DOPPLER ULTRASOUND TECHNIQUE: Gray-scale sonography with graded compression, as well as color Doppler and duplex ultrasound were performed to evaluate the lower extremity deep venous systems from the level of the common femoral vein and including the common femoral, femoral, profunda femoral, popliteal and calf veins including the posterior tibial, peroneal and gastrocnemius veins when visible. The superficial great saphenous vein was also interrogated. Spectral Doppler was utilized to evaluate flow at rest and with distal augmentation maneuvers in the common femoral, femoral and popliteal veins. COMPARISON:  None. FINDINGS: Contralateral Common Femoral Vein: Respiratory phasicity is normal and symmetric with the symptomatic side. No evidence of thrombus. Normal compressibility. Common Femoral Vein: No evidence of thrombus. Normal compressibility, respiratory phasicity and response to augmentation. Saphenofemoral Junction: No evidence of thrombus. Normal compressibility and flow on color Doppler imaging. Profunda Femoral Vein: No evidence of thrombus. Normal compressibility and flow on color Doppler imaging. Femoral Vein: No evidence of thrombus. Normal compressibility, respiratory phasicity and response to augmentation. Popliteal Vein: No evidence of thrombus. Normal compressibility, respiratory phasicity and response to augmentation. Calf Veins: No evidence of thrombus. Normal compressibility and flow on color Doppler imaging. Superficial Great Saphenous Vein: No evidence of thrombus. Normal compressibility. Venous Reflux:  None. Other Findings:  None. IMPRESSION: No evidence of deep venous thrombosis. Electronically Signed   By: Anner Crete M.D.   On: 02/08/2019 23:27    ____________________________________________  PROCEDURES   Procedure(s) performed (including  Critical Care):  Procedures  ____________________________________________  INITIAL IMPRESSION / MDM  / ASSESSMENT AND PLAN / ED COURSE  As part of my medical decision making, I reviewed the following data within the [facility] notes reviewed and incorporated, Old chart reviewed, Notes from prior ED visits, and [place] Controlled Substance Database       *NAARA HAMMEN was evaluated in Emergency Department on 02/09/2019 for the symptoms described in the history of present illness. She was evaluated in the context of the global COVID-19 pandemic, which necessitated consideration that the patient might be at risk for infection with the SARS-CoV-2 virus that causes COVID-19. Institutional protocols and algorithms that pertain to the evaluation of patients at risk for COVID-19 are in a state of rapid change based on information released by regulatory bodies including the CDC and federal and state organizations. These policies and algorithms were followed during the patient's care in the ED.  Some ED evaluations and interventions may be delayed as a result of limited staffing during the pandemic.*     Medical Decision Making:  34 yo F here with SI in setting of recently moving back in with mother. Labs show mild dehydration, significant hyperglycemia though normal CO2, normal AG and no signs of DKA. She c/o LLE swelling but DVT study neg. Home meds and home insulin regimen ordered. TTS/Psych consulted. Diabetic coordinator consulted as well while boarding in ED.  ____________________________________________  FINAL CLINICAL IMPRESSION(S) / ED DIAGNOSES  Final diagnoses:  Suicidal ideation  Hyperglycemia  Type 2 diabetes mellitus without complication, with long-term current use of insulin ([place])     MEDICATIONS GIVEN DURING THIS VISIT:  Medications  atorvastatin (LIPITOR) tablet 40 mg (40 mg Oral Given 02/08/19 2336)  busPIRone (BUSPAR) tablet 45 mg (45 mg Oral Given 02/08/19 2330)  chlorproMAZINE (THORAZINE) tablet 10 mg (10 mg Oral Given 02/08/19 2332)  gabapentin (NEURONTIN)  capsule 400 mg (has no administration in time range)  insulin aspart (novoLOG) injection 40 Units (40 Units Subcutaneous Given 02/08/19 2238)  insulin detemir (LEVEMIR) injection 95 Units (95 Units Subcutaneous Given 02/09/19 0105)  lamoTRIgine (LAMICTAL) tablet 150 mg (150 mg Oral Given 02/08/19 2334)  levETIRAcetam (KEPPRA) tablet 1,000 mg (1,000 mg Oral Given 02/08/19 2335)  lisinopril (ZESTRIL) tablet 10 mg (10 mg Oral Not Given 02/08/19 2259)  Melatonin TABS 2.5 mg (2.5 mg Oral Given 02/08/19 2334)  metoprolol tartrate (LOPRESSOR) tablet 25 mg (25 mg Oral Given 02/08/19 2333)  norethindrone (MICRONOR) 0.35 MG tablet 0.35 mg (0.35 mg Oral Not Given 02/08/19 2300)  pantoprazole (PROTONIX) EC tablet 40 mg (40 mg Oral Not Given 02/08/19 2300)  QUEtiapine (SEROQUEL XR) 24 hr tablet 450 mg (450 mg Oral Given 02/08/19 2333)  QUEtiapine (SEROQUEL) tablet 100 mg (100 mg Oral Not Given 02/08/19 2301)  sertraline (ZOLOFT) tablet 200 mg (200 mg Oral Not Given 02/08/19 2301)  insulin aspart (novoLOG) 100 UNIT/ML injection (has no administration in time range)  insulin aspart (novoLOG) 100 UNIT/ML injection (has no administration in time range)  fosfomycin (MONUROL) packet 3 g (3 g Oral Given 02/08/19 2223)     ED Discharge Orders    None       Note:  This document was prepared using Dragon voice recognition software and may include unintentional dictation errors.   Duffy Bruce, MD 02/09/19 [phone]

## 2019-02-08 NOTE — ED Notes (Signed)
Pt. Given 32 oz of water to drink after patient has finished 8 0z cup of water.

## 2019-02-08 NOTE — ED Notes (Signed)
A person claiming to be patients mother called and wanted to speak with patient.  Person told that patient only wanted one person to know about her care and she was not the person.  Person claimed patient lived with her.  Person told patient would be notified she called.

## 2019-02-08 NOTE — Consult Note (Signed)
[facility] Face-to-Face Psychiatry Consult   Reason for Consult:  Psych evaluation Referring Physician:  Dr. Ellender Hose Patient Identification: Tina Saunders MRN:  [id number] Principal Diagnosis: <principal problem not specified> Diagnosis:  Active Problems:   * No active hospital problems. 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 female patient admitted with per Pt. States she has been living with mother for the past couple of days after being discharged from [facility].  Pt. States mother has no running water in camping trailer, pt. Also believes mother has taken money from her bank account.  Pt. Does not want to go back to mothers house or [place].  HPI (per EDP):Tina Saunders is a 34 y.o. female  With h/o schizoaffective d/o, HTN, HLD, DM, PCOS, here with multiple complaints.Pt reports that she just moved out of her group home to live with her mother. She was reportedly convinced of this by her mother and thought it was a good idea. Since returning, she states she had no idea that where she lived had no power or running water, and that her mother has taken her disability saved money. She subsequently feels worthless, hopeless, and has a plan to kill herself via OD. History of prior suicide attempts. She did not actually harm herself. She also incidentally notes swelling in her L leg and reports a ho DVT. No CP, SOB. No fevers.  HPI:  Tina Saunders, 34 y.o., female patient seen face to face by this provider; chart reviewed and consulted with Dr. Ellender Hose on 02/08/19.  On evaluation Tina Saunders reports that she has been been living with her mom for the past 3 days and does not want to stay there because she states there is no hot water.  As a result, she began to hear voices telling her to kill herself by taking all of her medication.  Patient has been dx with cluster B personality d/o and has an extensive hx of ER visits  as far back as 2018 (all with very similar presentations) [facility], notably, [facility] of which she no longer resides there as of 3 days ago.  Patient stated that her plan is get admitted for psych inpatient unit and while she's admitted, her care giver Jerrel Ivory will find her a group home for her to live. Writer believes this ER visit is for secondary gain and therefore is recommending overnight observation and reassessment in the am. To note, patient stated that she could not wear a mask because she has asthma.  At this time, patient is not being seen by a therapist or psychiatrist because she has refused to follow-up. She was referrred to the ACT team in 2018 after a 2 day admission to [facility] but has not followed up. Writer suggest as social work consult for possible housing alternatives rather than an inpatient admission.  During evaluation Tina Saunders is lying on her bed and is fully engaged in the assessment; she is alert/oriented x 4; calm/cooperative; and mood congruent with affect.  Patient is speaking in a clear tone at moderate volume, and normal pace; with good eye contact.  Her thought process is coherent and relevant; There is no indication that she is currently responding to internal/external stimuli or experiencing delusional thought content.  Patient endorses suicidal/self-harm but denies homicidal ideation. The patient is not currently experiencing psychosis  or  paranoia.  Patient has remained calm throughout assessment and has answered questions appropriately.    Past Psychiatric History: Yes as stated below  Risk to Self:  no Risk to Others:  no Prior Inpatient Therapy:  no Prior Outpatient Therapy:    Past Medical History:  Past Medical History:  Diagnosis Date  . Anxiety   . Asthma   . Bipolar 1 disorder ([place])   . Cancer of abdominal wall   . Depression   . Diabetes mellitus without complication ([place])   . High cholesterol   .  Hypertension   . Intentional drug overdose ([place]) 07/26/2017   Seroquel and buspirone   . Obesity   . Obesity   . Polycystic ovarian syndrome 07/01/2011   Patient report  . 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 Schizophrenia ([place])   . Seizures ([place])     Past Surgical History:  Procedure Laterality Date  . CHOLECYSTECTOMY    . COLONOSCOPY WITH PROPOFOL N/A 03/08/2017   Procedure: COLONOSCOPY WITH PROPOFOL;  Surgeon: Milus Banister, MD;  Location: [facility];  Service: Endoscopy;  Laterality: N/A;  . HERNIA REPAIR    . Ovarian Cyst Excision    . VARICOSE VEIN SURGERY     Family History:  Family History  Problem Relation Age of Onset  . Coronary artery disease Maternal Grandmother   . Diabetes type II Maternal Grandmother   . Cancer Maternal Grandmother   . Hypertension Mother   . Hypertension Father    Family Psychiatric  History: pt denies Social History:  Social History   Substance and Sexual Activity  Alcohol Use No     Social History   Substance and Sexual Activity  Drug Use No    Social History   Socioeconomic History  . Marital status: Single    Spouse name: Not on file  . Number of children: 0  . Years of education: Not on file  . Highest education level: Not on file  Occupational History  . Not on file  Tobacco Use  . Smoking status: Never Smoker  . Smokeless tobacco: Never Used  Substance and Sexual Activity  . Alcohol use: No  . Drug use: No  . Sexual activity: Never    Birth control/protection: None  Other Topics Concern  . Not on file  Social History Narrative  . Not on file   Social Determinants of Health   Financial Resource Strain:   . Difficulty of Paying Living Expenses: Not on file  Food Insecurity:   . Worried About Charity fundraiser in the Last Year: Not on file  . Ran Out of Food in the Last Year: Not on file  Transportation Needs:   . Lack of Transportation (Medical): Not on file  . Lack of Transportation (Non-Medical): Not on file   Physical Activity:   . Days of Exercise per Week: Not on file  . Minutes of Exercise per Session: Not on file  Stress:   . Feeling of Stress : Not on file  Social Connections:   . Frequency of Communication with Friends and Family: Not on file  . Frequency of Social Gatherings with Friends and Family: Not on file  . Attends Religious Services: Not on file  . Active Member of Clubs or Organizations: Not on file  . Attends Archivist Meetings: Not on file  . Marital Status: Not on file   Additional Social History:    Allergies:   Allergies  Allergen Reactions  .  Fish-Derived Products Anaphylaxis    Can only eat FLounder  . Geodon [Ziprasidone Hcl] Other (See Comments)    Face pulls, cant swallow - Locked Jaw  . Haldol [Haloperidol Lactate] Other (See Comments)    Face pulls, can't swallow - Locked Jaw  . Buprenorphine Hcl Hives, Itching, Rash and Other (See Comments)    GI upset  . Compazine [Prochlorperazine] Other (See Comments)    anxiety and hyperactivity  . Morphine And Related Hives, Itching, Rash and Other (See Comments)    GI upset  . Toradol [Ketorolac Tromethamine] Other (See Comments)    Anxiety and hyperactivity    Labs:  Results for orders placed or performed during the hospital encounter of 02/08/19 (from the past 48 hour(s))  Urine Drug Screen, Qualitative     Status: Abnormal   Collection Time: 02/08/19  7:33 PM  Result Value Ref Range   Tricyclic, Ur Screen POSITIVE (A) NONE DETECTED   Amphetamines, Ur Screen NONE DETECTED NONE DETECTED   MDMA (Ecstasy)Ur Screen NONE DETECTED NONE DETECTED   Cocaine Metabolite,Ur [place] NONE DETECTED NONE DETECTED   Opiate, Ur Screen NONE DETECTED NONE DETECTED   Phencyclidine (PCP) Ur S NONE DETECTED NONE DETECTED   Cannabinoid 50 Ng, Ur [place] NONE DETECTED NONE DETECTED   Barbiturates, Ur Screen NONE DETECTED NONE DETECTED   Benzodiazepine, Ur Scrn NONE DETECTED NONE DETECTED   Methadone Scn, Ur NONE DETECTED  NONE DETECTED    Comment: (NOTE) Tricyclics + metabolites, urine    Cutoff 1000 ng/mL Amphetamines + metabolites, urine  Cutoff 1000 ng/mL MDMA (Ecstasy), urine              Cutoff 500 ng/mL Cocaine Metabolite, urine          Cutoff 300 ng/mL Opiate + metabolites, urine        Cutoff 300 ng/mL Phencyclidine (PCP), urine         Cutoff 25 ng/mL Cannabinoid, urine                 Cutoff 50 ng/mL Barbiturates + metabolites, urine  Cutoff 200 ng/mL Benzodiazepine, urine              Cutoff 200 ng/mL Methadone, urine                   Cutoff 300 ng/mL The urine drug screen provides only a preliminary, unconfirmed analytical test result and should not be used for non-medical purposes. Clinical consideration and professional judgment should be applied to any positive drug screen result due to possible interfering substances. A more specific alternate chemical method must be used in order to obtain a confirmed analytical result. Gas chromatography / mass spectrometry (GC/MS) is the preferred confirmat ory method. Performed at [facility], [address] [place], [place] [address]   Urinalysis, Routine w reflex microscopic     Status: Abnormal   Collection Time: 02/08/19  7:33 PM  Result Value Ref Range   Color, Urine YELLOW (A) YELLOW   APPearance HAZY (A) CLEAR   Specific Gravity, Urine 1.037 (H) 1.005 - 1.030   pH 5.0 5.0 - 8.0   Glucose, UA >=500 (A) NEGATIVE mg/dL   Hgb urine dipstick SMALL (A) NEGATIVE   Bilirubin Urine NEGATIVE NEGATIVE   Ketones, ur 5 (A) NEGATIVE mg/dL   Protein, ur NEGATIVE NEGATIVE mg/dL   Nitrite NEGATIVE NEGATIVE   Leukocytes,Ua MODERATE (A) NEGATIVE   RBC / HPF 6-10 0 - 5 RBC/hpf   WBC, UA 21-50  0 - 5 WBC/hpf   Bacteria, UA FEW (A) NONE SEEN   Squamous Epithelial / LPF 0-5 0 - 5    Comment: Performed at [facility], [address] [place], [place] [address]  Comprehensive metabolic panel     Status: Abnormal   Collection Time:  02/08/19  7:34 PM  Result Value Ref Range   Sodium 133 (L) 135 - 145 mmol/L   Potassium 4.1 3.5 - 5.1 mmol/L   Chloride 98 98 - 111 mmol/L   CO2 27 22 - 32 mmol/L   Glucose, Bld 415 (H) 70 - 99 mg/dL   BUN 8 6 - 20 mg/dL   Creatinine, Ser 0.62 0.44 - 1.00 mg/dL   Calcium 8.8 (L) 8.9 - 10.3 mg/dL   Total Protein 7.4 6.5 - 8.1 g/dL   Albumin 3.9 3.5 - 5.0 g/dL   AST 39 15 - 41 U/L   ALT 34 0 - 44 U/L   Alkaline Phosphatase 125 38 - 126 U/L   Total Bilirubin 0.6 0.3 - 1.2 mg/dL   GFR calc non Af Amer >60 >60 mL/min   GFR calc Af Amer >60 >60 mL/min   Anion gap 8 5 - 15    Comment: Performed at [facility], [address] [place], [place] [address]  Ethanol     Status: None   Collection Time: 02/08/19  7:34 PM  Result Value Ref Range   Alcohol, Ethyl (B) <10 <10 mg/dL    Comment: (NOTE) Lowest detectable limit for serum alcohol is 10 mg/dL. For medical purposes only. Performed at [facility], [address] [place], [place] [address]   CBC with Diff     Status: Abnormal   Collection Time: 02/08/19  7:34 PM  Result Value Ref Range   WBC 4.0 4.0 - 10.5 K/uL   RBC 4.27 3.87 - 5.11 MIL/uL   Hemoglobin 10.5 (L) 12.0 - 15.0 g/dL   HCT 34.4 (L) 36.0 - 46.0 %   MCV 80.6 80.0 - 100.0 fL   MCH 24.6 (L) 26.0 - 34.0 pg   MCHC 30.5 30.0 - 36.0 g/dL   RDW 16.6 (H) 11.5 - 15.5 %   Platelets 110 (L) 150 - 400 K/uL    Comment: PLATELET COUNT CONFIRMED BY SMEAR   nRBC 0.0 0.0 - 0.2 %   Neutrophils Relative % 66 %   Neutro Abs 2.7 1.7 - 7.7 K/uL   Lymphocytes Relative 23 %   Lymphs Abs 0.9 0.7 - 4.0 K/uL   Monocytes Relative 5 %   Monocytes Absolute 0.2 0.1 - 1.0 K/uL   Eosinophils Relative 4 %   Eosinophils Absolute 0.2 0.0 - 0.5 K/uL   Basophils Relative 1 %   Basophils Absolute 0.0 0.0 - 0.1 K/uL   Immature Granulocytes 1 %   Abs Immature Granulocytes 0.02 0.00 - 0.07 K/uL    Comment: Performed at [facility], [address] [place],   [address]  Glucose, capillary     Status: Abnormal   Collection Time: 02/08/19  7:39 PM  Result Value Ref Range   Glucose-Capillary 386 (H) 70 - 99 mg/dL    Current Facility-Administered Medications  Medication Dose Route Frequency Provider Last Rate Last Admin  . atorvastatin (LIPITOR) tablet 40 mg  40 mg Oral QHS Duffy Bruce, MD      . busPIRone (BUSPAR) tablet 45 mg  45 mg Oral TID Duffy Bruce, MD      . chlorproMAZINE (THORAZINE)  tablet 10 mg  10 mg Oral TID Duffy Bruce, MD      . gabapentin (NEURONTIN) capsule 400 mg  400 mg Oral TID Duffy Bruce, MD      . insulin aspart (novoLOG) 100 UNIT/ML injection           . insulin aspart (novoLOG) 100 UNIT/ML injection           . [START ON 02/09/2019] insulin aspart (novoLOG) injection 40 Units  40 Units Subcutaneous TID WC Duffy Bruce, MD   40 Units at 02/08/19 2238  . insulin detemir (LEVEMIR) injection 95 Units  95 Units Subcutaneous BID Duffy Bruce, MD      . lamoTRIgine (LAMICTAL) tablet 150 mg  150 mg Oral BID Duffy Bruce, MD      . levETIRAcetam (KEPPRA) tablet 1,000 mg  1,000 mg Oral BID Duffy Bruce, MD      . lisinopril (ZESTRIL) tablet 10 mg  10 mg Oral Daily Duffy Bruce, MD      . Melatonin TABS 2.5 mg  2.5 mg Oral QHS Duffy Bruce, MD      . metoprolol tartrate (LOPRESSOR) tablet 25 mg  25 mg Oral BID Duffy Bruce, MD      . norethindrone (MICRONOR) 0.35 MG tablet 0.35 mg  1 tablet Oral Daily Duffy Bruce, MD      . pantoprazole (PROTONIX) EC tablet 40 mg  40 mg Oral Daily Duffy Bruce, MD      . QUEtiapine (SEROQUEL XR) 24 hr tablet 450 mg  450 mg Oral QHS Duffy Bruce, MD      . QUEtiapine (SEROQUEL) tablet 100 mg  100 mg Oral Daily Duffy Bruce, MD      . sertraline (ZOLOFT) tablet 200 mg  200 mg Oral Daily Duffy Bruce, MD       Current Outpatient Medications  Medication Sig Dispense Refill  . atorvastatin (LIPITOR) 40 MG tablet Take 1 tablet (40 mg total) by mouth at  bedtime. For high cholesterol 30 tablet 0  . bisacodyl (DULCOLAX) 5 MG EC tablet Take 2 tablets (10 mg total) by mouth daily as needed for moderate constipation. (May buy from over the counter): For constipation 30 tablet 0  . busPIRone (BUSPAR) 30 MG tablet Take 45 mg by mouth 3 (three) times daily.    . chlorproMAZINE (THORAZINE) 25 MG tablet Take 25 mg by mouth 3 (three) times daily.    Mariane Baumgarten Sodium (DSS) 100 MG CAPS Take 1 (one) Tablet two times daily, as needed for constipation    . gabapentin (NEURONTIN) 400 MG capsule Take 400 mg by mouth 3 (three) times daily.    . insulin aspart (NOVOLOG) 100 UNIT/ML injection Inject 30 Units into the skin 3 (three) times daily with meals. For diabetes management (Patient taking differently: Inject 40 Units into the skin 3 (three) times daily with meals. For diabetes management) 10 mL 0  . insulin detemir (LEVEMIR) 100 UNIT/ML injection Inject 0.7 mLs (70 Units total) into the skin 2 (two) times daily. For diabetes management (Patient taking differently: Inject 95 Units into the skin 2 (two) times daily. For diabetes management) 10 mL 0  . Insulin Syringe-Needle U-100 25G X 1" 1 ML MISC 1 Syringe by Does not apply route 4 (four) times daily -  before meals and at bedtime. For blood sugar checks 1 each 0  . lamoTRIgine (LAMICTAL) 150 MG tablet Take 1 tablet (150 mg total) by mouth 2 (two) times daily. 180 tablet 1  .  levETIRAcetam (KEPPRA) 1000 MG tablet Take 1 tablet (1,000 mg total) by mouth 2 (two) times daily. 180 tablet 4  . lisinopril (ZESTRIL) 10 MG tablet Take 10 mg by mouth daily.    . magnesium oxide (MAG-OX) 400 (241.3 Mg) MG tablet Take 1 tablet (400 mg total) by mouth daily. For acid rflux    . Melatonin 3 MG TABS Take 1 tablet (3 mg total) by mouth at bedtime. For sleep 30 tablet 0  . metoprolol tartrate (LOPRESSOR) 25 MG tablet Take 1 tablet (25 mg total) by mouth 2 (two) times daily. For high blood pressure 60 tablet 0  . norethindrone  (MICRONOR) 0.35 MG tablet Take 1 tablet by mouth daily.     . pantoprazole (PROTONIX) 40 MG tablet Take 40 mg by mouth daily.    . QUEtiapine (SEROQUEL XR) 200 MG 24 hr tablet Take 450 mg by mouth at bedtime.    Marland Kitchen QUEtiapine (SEROQUEL) 100 MG tablet Take 1 tablet (100 mg total) by mouth daily. For mood control 30 tablet 0  . sertraline (ZOLOFT) 100 MG tablet Take 2 tablets (200 mg total) by mouth daily. Fordepression 60 tablet 0  . TOUJEO MAX SOLOSTAR 300 UNIT/ML SOPN     . traZODone (DESYREL) 50 MG tablet Take 50 mg by mouth at bedtime.      Musculoskeletal: Strength & Muscle Tone: within normal limits Gait & Station: normal Patient leans: N/A  Psychiatric Specialty Exam: Physical Exam  Nursing note and vitals reviewed. Constitutional: She is oriented to person, place, and time. She appears well-developed.  HENT:  Head: Normocephalic.  Eyes: Pupils are equal, round, and reactive to light.  Respiratory: Effort normal.  Musculoskeletal:        General: Normal range of motion.     Cervical back: Normal range of motion.  Neurological: She is alert and oriented to person, place, and time.  Skin: Skin is warm and dry.  Psychiatric: She has a normal mood and affect. Her speech is normal and behavior is normal. Judgment and thought content normal. Cognition and memory are normal.    Review of Systems  Psychiatric/Behavioral: Positive for behavioral problems and suicidal ideas.  All other systems reviewed and are negative.   Blood pressure (!) 141/72, pulse 88, temperature 98 F (36.7 C), temperature source Oral, resp. rate 16, height 5\' 4"  (1.626 m), weight 111.1 kg, last menstrual period 12/16/2018, SpO2 97 %.Body mass index is 42.05 kg/m.  General Appearance: Casual  Eye Contact:  Good  Speech:  Clear and Coherent  Volume:  Normal  Mood:  Euthymic  Affect:  Congruent  Thought Process:  Coherent and Descriptions of Associations: Intact  Orientation:  Full (Time, Place, and  Person)  Thought Content:  WDL and Logical  Suicidal Thoughts:  Yes.  with intent/plan  Homicidal Thoughts:  No  Memory:  Immediate;   Good  Judgement:  Good  Insight:  Good  Psychomotor Activity:  Normal  Concentration:  Concentration: Good  Recall:  Good  Fund of Knowledge:  Good  Language:  Good  Akathisia:  NA  Handed:  Right  AIMS (if indicated):     Assets:  Communication Skills Social Support  ADL's:  Intact  Cognition:  WNL  Sleep:        Treatment Plan Summary: Plan Overnight observaton and a social work consult   Disposition: Overnigt observation and reassess in the am  Deloria Lair, NP 02/08/2019 11:06 PM

## 2019-02-08 NOTE — ED Notes (Signed)
Patient received call from someone claiming to be father of patient, father told patient only wanted information relayed to one person and he was not that person, person hung up.

## 2019-02-08 NOTE — ED Notes (Signed)
Mallisa(former caregiver and person patient wants information to be shared with) called [phone] could be called for information.

## 2019-02-08 NOTE — ED Notes (Signed)
Pt. States feeling suicidal with a thoughts of overdosing on medication.  Pt. States feeling this way over the past 2 days.  Pt. States hx of SI attempt.

## 2019-02-08 NOTE — ED Triage Notes (Signed)
Pt arrives via [facility] with complaint of hyperglycemia, visual and auditory disturbances, and feelings of worthlessness x2 days. Per pt, she just was released from [facility] 2 days ago. Pt reports she plans on taking all of her medications and intends to act on it.

## 2019-02-09 DIAGNOSIS — F4325 Adjustment disorder with mixed disturbance of emotions and conduct: Secondary | ICD-10-CM

## 2019-02-09 LAB — RESPIRATORY PANEL BY RT PCR (FLU A&B, COVID)
Influenza A by PCR: NEGATIVE
Influenza B by PCR: NEGATIVE
SARS Coronavirus 2 by RT PCR: NEGATIVE

## 2019-02-09 LAB — GLUCOSE, CAPILLARY: Glucose-Capillary: 190 mg/dL — ABNORMAL HIGH (ref 70–99)

## 2019-02-09 LAB — PREGNANCY, URINE: Preg Test, Ur: NEGATIVE

## 2019-02-09 MED ORDER — NORETHINDRONE 0.35 MG PO TABS: 1.0000 | ORAL_TABLET | Freq: Every day | ORAL | Status: DC

## 2019-02-09 NOTE — Discharge Instructions (Signed)
Adjustment Disorder, Adult Adjustment disorder is a group of symptoms that can develop after a stressful life event, such as the loss of a job or serious physical illness. The symptoms can affect how you feel, think, and act. They may interfere with your relationships. Adjustment disorder increases your risk of suicide and substance abuse. If this disorder is not managed early, it can develop into a more serious condition, such as major depressive disorder or post-traumatic stress disorder. What are the causes? This condition happens when you have trouble recovering from or coping with a stressful life event. What increases the risk? You are more likely to develop this condition if:  You have had depression or anxiety.  You are being treated for a long-term (chronic) illness.  You are being treated for an illness that cannot be cured (terminal illness).  You have a family history of mental illness. What are the signs or symptoms? Symptoms of this condition include:  Extreme trouble doing daily tasks, such as going to work.  Sadness, depression, or crying spells.  Worrying a lot.  Loss of enjoyment.  Change in appetite or weight.  Feelings of loss or hopelessness.  Thoughts of suicide.  Anxiety, worry, or nervousness.  Trouble sleeping.  Avoiding family and friends.  Fighting or vandalism.  Complaining of feeling sick without being ill.  Feeling dazed or disconnected.  Nightmares.  Trouble sleeping.  Irritability.  Reckless driving.  Poor work performance.  Ignoring bills. Symptoms of this condition start within three months of the stressful event. They do not last more than six months, unless the stressful circumstances last longer. Normal grieving after the death of a loved one is not a symptom of this condition. How is this diagnosed? To diagnose this condition, your health care provider will ask about what has happened in your life and how it has affected  you. He or she may also ask about your medical history and your use of medicines, alcohol, and other substances. Your health care provider may do a physical exam and order lab tests or other studies. You may be referred to a mental health specialist. How is this treated? Treatment options for this condition include:  Counseling or talk therapy. Talk therapy is usually provided by mental health specialists.  Medicines. Certain medicines may help with depression, anxiety, and sleep.  Support groups. These offer emotional support, advice, and guidance. They are made up of people who have had similar experiences.  Observation and time. This is sometimes called "watchful waiting." In this treatment, health care providers monitor your health and behavior without other treatment. Adjustment disorder sometimes gets better on its own with time. Follow these instructions at home:  Take over-the-counter and prescription medicines only as told by your health care provider.  Keep all follow-up visits as told by your health care provider. This is important. Contact a health care provider if:  Your symptoms do not improve in six months.  Your symptoms get worse. Get help right away if:  You have serious thoughts about hurting yourself or someone else. If you ever feel like you may hurt yourself or others, or have thoughts about taking your own life, get help right away. You can go to your nearest emergency department or call:  Your local emergency services (911 in the [place]  A suicide crisis helpline, such as the National Suicide Prevention Lifeline at 1-800-273-8255. This is open 24 hours a day. Summary  Adjustment disorder is a group of symptoms that can develop   after a stressful life event, such as the loss of a job or serious physical illness. The symptoms can affect how you feel, think, and act. They may interfere with your relationships.  Symptoms of this condition start within three months  of the stressful event. They do not last more than six months, unless the stressful circumstances last longer.  Treatment may include talk therapy, medicines, participation in a support group, or observation to see if symptoms improve.  Contact your health care provider if your symptoms get worse or do not improve in six months.  If you ever feel like you may hurt yourself or others, or have thoughts about taking your own life, get help right away. This information is not intended to replace advice given to you by your health care provider. Make sure you discuss any questions you have with your health care provider. Document Revised: 12/22/2016 Document Reviewed: 03/10/2016 Elsevier Patient Education  2020 [employer]  
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
## 2019-02-09 NOTE — ED Notes (Signed)
Pt requested shower, given shower items and clean scrubs to change into.

## 2019-02-09 NOTE — ED Notes (Signed)
Discussed pts homeless issues with EDP and charge RN. Per EDP ok to place Transition of care consult.

## 2019-02-09 NOTE — ED Notes (Signed)
Pt given breakfast tray

## 2019-02-09 NOTE — ED Notes (Addendum)
Pt states that she is ready for discharge. When questioned on where is was going to go pt stated that she and her friend were going to work on that. Per [employer], np and edp pt can be discharged.

## 2019-02-10 LAB — URINE CULTURE

## 2019-02-25 DIAGNOSIS — E1165 Type 2 diabetes mellitus with hyperglycemia: Secondary | ICD-10-CM | POA: Diagnosis not present

## 2019-02-25 DIAGNOSIS — Z20822 Contact with and (suspected) exposure to covid-19: Secondary | ICD-10-CM | POA: Diagnosis not present

## 2019-02-25 DIAGNOSIS — Z79899 Other long term (current) drug therapy: Secondary | ICD-10-CM | POA: Diagnosis not present

## 2019-02-25 DIAGNOSIS — R569 Unspecified convulsions: Secondary | ICD-10-CM | POA: Diagnosis not present

## 2019-02-25 DIAGNOSIS — Z111 Encounter for screening for respiratory tuberculosis: Secondary | ICD-10-CM | POA: Diagnosis not present

## 2019-02-25 DIAGNOSIS — F259 Schizoaffective disorder, unspecified: Secondary | ICD-10-CM | POA: Diagnosis not present

## 2019-02-25 DIAGNOSIS — K5900 Constipation, unspecified: Secondary | ICD-10-CM | POA: Diagnosis not present

## 2019-02-25 DIAGNOSIS — Z794 Long term (current) use of insulin: Secondary | ICD-10-CM | POA: Diagnosis not present

## 2019-02-25 DIAGNOSIS — Z6841 Body Mass Index (BMI) 40.0 and over, adult: Secondary | ICD-10-CM | POA: Diagnosis not present

## 2019-02-25 DIAGNOSIS — G40909 Epilepsy, unspecified, not intractable, without status epilepticus: Secondary | ICD-10-CM | POA: Diagnosis not present

## 2019-03-04 DIAGNOSIS — F331 Major depressive disorder, recurrent, moderate: Secondary | ICD-10-CM | POA: Diagnosis not present

## 2019-05-13 NOTE — Progress Notes (Deleted)
PATIENT: Tina Saunders DOB: 09-15-1985  REASON FOR VISIT: follow up HISTORY FROM: patient  HISTORY OF PRESENT ILLNESS: Today 05/13/19  HISTORY   HISTORY  Tina Saunders a 34 year old female, seen in request byher primary care PA Silverio Decamp evaluation of seizure, initial evaluation was on August 29, 2018. 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  She presented with seizure in November 2018, was admitted to [facility], reviewed MRI of the brain with without contrast on December 11, 2016 that was normal  Before January 2020, she lives alone, but had frequent recurrent seizure, was not compliant with her medications, there was safety concerns, per manager, her mood is relatively stable, she has recurrent seizure-like spells couple times each month, sudden onset of body tremor, transient loss of consciousness, eyes rolling back, she had one episode this morning, she was sitting in her wheelchair had sudden onset of body shaking, patient denied warning signs, she slide out of the chair, lost consciousness for less than 1 minute, no tongue biting, no loss of bladder and bowel incontinence. She is taking Keppra 1000 mg twice a day, Topamax 100 mg every night,  Laboratory evaluations, hemoglobin was 11.4, normal CMP  Update November 13, 2018 SS: EEG 09/12/2018 mildly abnormal, evidence of mild to moderate background  slowing, no evidence of epileptiform discharge.   MRI of the brain 11/11/2018 was normal with and without contrast.  She is here today with Malachy Mood, her caregiver from her assisted living facility, [facility].  Since last seen, she has had 2 seizures.  The most recent occurred 10/29/2018, at 1215 pm the staff heard in the kitchen, found her on the floor, no report of shaking, the record indicates 15 minutes later she was given ibuprofen for headache, was sitting in the chair watching TV, came around quickly.  Prior to that, she had a seizure in August, staff heard a thud, found her on the floor, she had a nose bleed, reports shaking all over.  They report a decrease in seizure frequency while taking Lamictal and Keppra.  Her psychiatrist has decreased her dose of Thorazine.  Her A1c was 14 in January, is now 7.  Overall, doing well, tolerating medications without side effect.  Update May 14, 2019 SS: When last seen, Lamictal was increased to 150 mg twice a day due to recurrent seizure. REVIEW OF SYSTEMS: Out of a complete 14 system review of symptoms, the patient complains only of the following symptoms, and all other reviewed systems are negative.  ALLERGIES: Allergies  Allergen Reactions  . Fish-Derived Products Anaphylaxis    Can only eat FLounder  . Geodon [Ziprasidone Hcl] Other (See Comments)    Face pulls, cant swallow - Locked Jaw  . Haldol [Haloperidol Lactate] Other (See Comments)    Face pulls, can't swallow - Locked Jaw  . Buprenorphine Hcl Hives, Itching, Rash and Other (See Comments)    GI upset  . Compazine [Prochlorperazine] Other (See Comments)    anxiety and hyperactivity  . Morphine And Related Hives, Itching, Rash and Other (See Comments)  GI upset  . Toradol [Ketorolac Tromethamine] Other (See Comments)    Anxiety and hyperactivity    HOME MEDICATIONS: Outpatient Medications Prior to Visit  Medication Sig Dispense Refill  . atorvastatin (LIPITOR) 40  MG tablet Take 1 tablet (40 mg total) by mouth at bedtime. For high cholesterol 30 tablet 0  . bisacodyl (DULCOLAX) 5 MG EC tablet Take 2 tablets (10 mg total) by mouth daily as needed for moderate constipation. (May buy from over the counter): For constipation 30 tablet 0  . busPIRone (BUSPAR) 30 MG tablet Take 45 mg by mouth 3 (three) times daily.    . chlorproMAZINE (THORAZINE) 25 MG tablet Take 25 mg by mouth 3 (three) times daily.    Mariane Baumgarten Sodium (DSS) 100 MG CAPS Take 1 (one) Tablet two times daily, as needed for constipation    . gabapentin (NEURONTIN) 400 MG capsule Take 400 mg by mouth 3 (three) times daily.    . insulin aspart (NOVOLOG) 100 UNIT/ML injection Inject 30 Units into the skin 3 (three) times daily with meals. For diabetes management (Patient taking differently: Inject 40 Units into the skin 3 (three) times daily with meals. For diabetes management) 10 mL 0  . insulin detemir (LEVEMIR) 100 UNIT/ML injection Inject 0.7 mLs (70 Units total) into the skin 2 (two) times daily. For diabetes management (Patient taking differently: Inject 95 Units into the skin 2 (two) times daily. For diabetes management) 10 mL 0  . Insulin Syringe-Needle U-100 25G X 1" 1 ML MISC 1 Syringe by Does not apply route 4 (four) times daily -  before meals and at bedtime. For blood sugar checks 1 each 0  . lamoTRIgine (LAMICTAL) 150 MG tablet Take 1 tablet (150 mg total) by mouth 2 (two) times daily. 180 tablet 1  . levETIRAcetam (KEPPRA) 1000 MG tablet Take 1 tablet (1,000 mg total) by mouth 2 (two) times daily. 180 tablet 4  . lisinopril (ZESTRIL) 10 MG tablet Take 10 mg by mouth daily.    . magnesium oxide (MAG-OX) 400 (241.3 Mg) MG tablet Take 1 tablet (400 mg total) by mouth daily. For acid rflux    . Melatonin 3 MG TABS Take 1 tablet (3 mg total) by mouth at bedtime. For sleep 30 tablet 0  . metoprolol tartrate (LOPRESSOR) 25 MG tablet Take 1 tablet (25 mg total) by mouth 2 (two) times daily. For  high blood pressure 60 tablet 0  . norethindrone (MICRONOR) 0.35 MG tablet Take 1 tablet by mouth daily.     . pantoprazole (PROTONIX) 40 MG tablet Take 40 mg by mouth daily.    . QUEtiapine (SEROQUEL XR) 200 MG 24 hr tablet Take 450 mg by mouth at bedtime.    Marland Kitchen QUEtiapine (SEROQUEL) 100 MG tablet Take 1 tablet (100 mg total) by mouth daily. For mood control 30 tablet 0  . sertraline (ZOLOFT) 100 MG tablet Take 2 tablets (200 mg total) by mouth daily. Fordepression 60 tablet 0  . TOUJEO MAX SOLOSTAR 300 UNIT/ML SOPN     . traZODone (DESYREL) 50 MG tablet Take 50 mg by mouth at bedtime.     No facility-administered medications prior to visit.    PAST MEDICAL HISTORY: Past Medical History:  Diagnosis Date  . Anxiety   . Asthma   . Bipolar 1 disorder ([place])   . Cancer of abdominal wall   . Depression   . Diabetes mellitus without complication ([place])   . High cholesterol   . Hypertension   .  Intentional drug overdose ([place]) 07/26/2017   Seroquel and buspirone   . Obesity   . Obesity   . Polycystic ovarian syndrome 07/01/2011   Patient report  . 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 Schizophrenia ([place])   . Seizures ([place])     PAST SURGICAL HISTORY: Past Surgical History:  Procedure Laterality Date  . CHOLECYSTECTOMY    . COLONOSCOPY WITH PROPOFOL N/A 03/08/2017   Procedure: COLONOSCOPY WITH PROPOFOL;  Surgeon: Milus Banister, MD;  Location: [facility] ENDOSCOPY;  Service: Endoscopy;  Laterality: N/A;  . HERNIA REPAIR    . Ovarian Cyst Excision    . VARICOSE VEIN SURGERY      FAMILY HISTORY: Family History  Problem Relation Age of Onset  . Coronary artery disease Maternal Grandmother   . Diabetes type II Maternal Grandmother   . Cancer Maternal Grandmother   . Hypertension Mother   . Hypertension Father     SOCIAL HISTORY: Social History   Socioeconomic History  . Marital status: Single    Spouse name: Not on file  . Number of children: 0  . Years of education: Not on file  . Highest  education level: Not on file  Occupational History  . Not on file  Tobacco Use  . Smoking status: Never Smoker  . Smokeless tobacco: Never Used  Substance and Sexual Activity  . Alcohol use: No  . Drug use: No  . Sexual activity: Never    Birth control/protection: None  Other Topics Concern  . Not on file  Social History Narrative  . Not on file   Social Determinants of Health   Financial Resource Strain:   . Difficulty of Paying Living Expenses:   Food Insecurity:   . Worried About Charity fundraiser in the Last Year:   . Arboriculturist in the Last Year:   Transportation Needs:   . Film/video editor (Medical):   Marland Kitchen Lack of Transportation (Non-Medical):   Physical Activity:   . Days of Exercise per Week:   . Minutes of Exercise per Session:   Stress:   . Feeling of Stress :   Social Connections:   . Frequency of Communication with Friends and Family:   . Frequency of Social Gatherings with Friends and Family:   . Attends Religious Services:   . Active Member of Clubs or Organizations:   . Attends Archivist Meetings:   Marland Kitchen Marital Status:   Intimate Partner Violence:   . Fear of Current or Ex-Partner:   . Emotionally Abused:   Marland Kitchen Physically Abused:   . Sexually Abused:       PHYSICAL EXAM  There were no vitals filed for this visit. There is no height or weight on file to calculate BMI.  Generalized: Well developed, in no acute distress   Neurological examination  Mentation: Alert oriented to time, place, history taking. Follows all commands speech and language fluent Cranial nerve II-XII: Pupils were equal round reactive to light. Extraocular movements were full, visual field were full on confrontational test. Facial sensation and strength were normal. Uvula tongue midline. Head turning and shoulder shrug  were normal and symmetric. Motor: The motor testing reveals 5 over 5 strength of all 4 extremities. Good symmetric motor tone is noted  throughout.  Sensory: Sensory testing is intact to soft touch on all 4 extremities. No evidence of extinction is noted.  Coordination: Cerebellar testing reveals good finger-nose-finger and heel-to-shin bilaterally.  Gait and station: Gait is normal. Tandem  gait is normal. Romberg is negative. No drift is seen.  Reflexes: Deep tendon reflexes are symmetric and normal bilaterally.   DIAGNOSTIC DATA (LABS, IMAGING, TESTING) - I reviewed patient records, labs, notes, testing and imaging myself where available.  Lab Results  Component Value Date   WBC 4.0 02/08/2019   HGB 10.5 (L) 02/08/2019   HCT 34.4 (L) 02/08/2019   MCV 80.6 02/08/2019   PLT 110 (L) 02/08/2019      Component Value Date/Time   NA 133 (L) 02/08/2019 1934   NA 139 07/13/2011 0025   K 4.1 02/08/2019 1934   K 4.3 07/13/2011 0025   CL 98 02/08/2019 1934   CL 106 07/13/2011 0025   CO2 27 02/08/2019 1934   CO2 25 07/13/2011 0025   GLUCOSE 415 (H) 02/08/2019 1934   GLUCOSE 105 (H) 07/13/2011 0025   BUN 8 02/08/2019 1934   BUN 9 07/13/2011 0025   CREATININE 0.62 02/08/2019 1934   CREATININE 0.68 07/13/2011 0025   CALCIUM 8.8 (L) 02/08/2019 1934   CALCIUM 8.8 07/13/2011 0025   PROT 7.4 02/08/2019 1934   PROT 7.9 07/13/2011 0025   ALBUMIN 3.9 02/08/2019 1934   ALBUMIN 3.5 07/13/2011 0025   AST 39 02/08/2019 1934   AST 57 (H) 07/13/2011 0025   ALT 34 02/08/2019 1934   ALT 38 07/13/2011 0025   ALKPHOS 125 02/08/2019 1934   ALKPHOS 101 07/13/2011 0025   BILITOT 0.6 02/08/2019 1934   BILITOT 0.2 07/13/2011 0025   GFRNONAA >60 02/08/2019 1934   GFRNONAA >60 07/13/2011 0025   GFRAA >60 02/08/2019 1934   GFRAA >60 07/13/2011 0025   Lab Results  Component Value Date   CHOL 113 11/03/2016   HDL 19 (L) 11/03/2016   LDLCALC 21 11/03/2016   TRIG 366 (H) 11/03/2016   CHOLHDL 5.9 11/03/2016   Lab Results  Component Value Date   HGBA1C 12.4 (H) 07/26/2017   No results found for: [id number] Lab Results  Component  Value Date   TSH 1.589 11/02/2016      ASSESSMENT AND PLAN 34 y.o. 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 here with ***   I spent 15 minutes with the patient. 50% of this time was spent   Butler Denmark, [place], DNP 05/13/2019, 4:18 PM [facility] [address] [place], [place] [address] [phone]

## 2019-05-14 ENCOUNTER — Ambulatory Visit: Payer: Medicare HMO | Admitting: Neurology

## 2019-05-20 ENCOUNTER — Ambulatory Visit: Payer: Medicare HMO | Admitting: Podiatry

## 2019-05-21 DIAGNOSIS — E7800 Pure hypercholesterolemia, unspecified: Secondary | ICD-10-CM | POA: Diagnosis not present

## 2019-05-21 DIAGNOSIS — F259 Schizoaffective disorder, unspecified: Secondary | ICD-10-CM | POA: Diagnosis not present

## 2019-05-21 DIAGNOSIS — Z6841 Body Mass Index (BMI) 40.0 and over, adult: Secondary | ICD-10-CM | POA: Diagnosis not present

## 2019-05-21 DIAGNOSIS — E1165 Type 2 diabetes mellitus with hyperglycemia: Secondary | ICD-10-CM | POA: Diagnosis not present

## 2019-05-21 DIAGNOSIS — I10 Essential (primary) hypertension: Secondary | ICD-10-CM | POA: Diagnosis not present

## 2019-05-21 DIAGNOSIS — R569 Unspecified convulsions: Secondary | ICD-10-CM | POA: Diagnosis not present

## 2019-05-21 DIAGNOSIS — Z79899 Other long term (current) drug therapy: Secondary | ICD-10-CM | POA: Diagnosis not present

## 2019-06-03 ENCOUNTER — Ambulatory Visit (INDEPENDENT_AMBULATORY_CARE_PROVIDER_SITE_OTHER): Payer: Medicare HMO | Admitting: Podiatry

## 2019-06-03 DIAGNOSIS — Z5329 Procedure and treatment not carried out because of patient's decision for other reasons: Secondary | ICD-10-CM

## 2019-06-03 NOTE — Progress Notes (Signed)
No show for appt. 

## 2019-06-15 DIAGNOSIS — R5383 Other fatigue: Secondary | ICD-10-CM | POA: Diagnosis not present

## 2019-06-15 DIAGNOSIS — D759 Disease of blood and blood-forming organs, unspecified: Secondary | ICD-10-CM | POA: Diagnosis not present

## 2019-06-15 DIAGNOSIS — E1165 Type 2 diabetes mellitus with hyperglycemia: Secondary | ICD-10-CM | POA: Diagnosis not present

## 2019-06-15 DIAGNOSIS — D696 Thrombocytopenia, unspecified: Secondary | ICD-10-CM | POA: Diagnosis not present

## 2019-06-15 DIAGNOSIS — R4781 Slurred speech: Secondary | ICD-10-CM | POA: Diagnosis not present

## 2019-06-15 DIAGNOSIS — R739 Hyperglycemia, unspecified: Secondary | ICD-10-CM | POA: Diagnosis not present

## 2019-06-15 DIAGNOSIS — R4182 Altered mental status, unspecified: Secondary | ICD-10-CM | POA: Diagnosis not present

## 2019-06-15 DIAGNOSIS — D509 Iron deficiency anemia, unspecified: Secondary | ICD-10-CM | POA: Diagnosis not present

## 2019-06-26 DIAGNOSIS — E119 Type 2 diabetes mellitus without complications: Secondary | ICD-10-CM | POA: Diagnosis not present

## 2019-06-26 DIAGNOSIS — M545 Low back pain: Secondary | ICD-10-CM | POA: Diagnosis not present

## 2019-07-22 DIAGNOSIS — E119 Type 2 diabetes mellitus without complications: Secondary | ICD-10-CM | POA: Diagnosis not present

## 2019-07-22 DIAGNOSIS — R404 Transient alteration of awareness: Secondary | ICD-10-CM | POA: Diagnosis not present

## 2019-07-22 DIAGNOSIS — R4020 Unspecified coma: Secondary | ICD-10-CM | POA: Diagnosis not present

## 2019-07-22 DIAGNOSIS — E1165 Type 2 diabetes mellitus with hyperglycemia: Secondary | ICD-10-CM | POA: Diagnosis not present

## 2019-07-22 DIAGNOSIS — Z8659 Personal history of other mental and behavioral disorders: Secondary | ICD-10-CM | POA: Diagnosis not present

## 2019-07-22 DIAGNOSIS — Z79899 Other long term (current) drug therapy: Secondary | ICD-10-CM | POA: Diagnosis not present

## 2019-07-22 DIAGNOSIS — I10 Essential (primary) hypertension: Secondary | ICD-10-CM | POA: Diagnosis not present

## 2019-07-22 DIAGNOSIS — Z794 Long term (current) use of insulin: Secondary | ICD-10-CM | POA: Diagnosis not present

## 2019-07-22 DIAGNOSIS — G40909 Epilepsy, unspecified, not intractable, without status epilepticus: Secondary | ICD-10-CM | POA: Diagnosis not present

## 2019-07-22 DIAGNOSIS — R4182 Altered mental status, unspecified: Secondary | ICD-10-CM | POA: Diagnosis not present

## 2019-07-22 DIAGNOSIS — Z8709 Personal history of other diseases of the respiratory system: Secondary | ICD-10-CM | POA: Diagnosis not present

## 2019-07-22 DIAGNOSIS — M25561 Pain in right knee: Secondary | ICD-10-CM | POA: Diagnosis not present

## 2019-07-22 DIAGNOSIS — E7800 Pure hypercholesterolemia, unspecified: Secondary | ICD-10-CM | POA: Diagnosis not present

## 2019-07-22 DIAGNOSIS — R569 Unspecified convulsions: Secondary | ICD-10-CM | POA: Diagnosis not present

## 2019-08-12 DIAGNOSIS — I10 Essential (primary) hypertension: Secondary | ICD-10-CM | POA: Diagnosis not present

## 2019-08-12 DIAGNOSIS — R569 Unspecified convulsions: Secondary | ICD-10-CM | POA: Diagnosis not present

## 2019-08-12 DIAGNOSIS — E1165 Type 2 diabetes mellitus with hyperglycemia: Secondary | ICD-10-CM | POA: Diagnosis not present

## 2019-08-12 DIAGNOSIS — Z6841 Body Mass Index (BMI) 40.0 and over, adult: Secondary | ICD-10-CM | POA: Diagnosis not present

## 2019-08-12 DIAGNOSIS — F259 Schizoaffective disorder, unspecified: Secondary | ICD-10-CM | POA: Diagnosis not present

## 2019-08-12 DIAGNOSIS — Z79899 Other long term (current) drug therapy: Secondary | ICD-10-CM | POA: Diagnosis not present

## 2019-08-13 DIAGNOSIS — R569 Unspecified convulsions: Secondary | ICD-10-CM | POA: Diagnosis not present

## 2019-08-13 DIAGNOSIS — R4182 Altered mental status, unspecified: Secondary | ICD-10-CM | POA: Diagnosis not present

## 2019-08-13 DIAGNOSIS — R739 Hyperglycemia, unspecified: Secondary | ICD-10-CM | POA: Diagnosis not present

## 2019-08-13 DIAGNOSIS — I10 Essential (primary) hypertension: Secondary | ICD-10-CM | POA: Diagnosis not present

## 2019-08-13 DIAGNOSIS — R404 Transient alteration of awareness: Secondary | ICD-10-CM | POA: Diagnosis not present

## 2019-08-13 DIAGNOSIS — E1165 Type 2 diabetes mellitus with hyperglycemia: Secondary | ICD-10-CM | POA: Diagnosis not present

## 2019-08-13 DIAGNOSIS — R4020 Unspecified coma: Secondary | ICD-10-CM | POA: Diagnosis not present

## 2019-08-13 DIAGNOSIS — R42 Dizziness and giddiness: Secondary | ICD-10-CM | POA: Diagnosis not present

## 2019-08-13 DIAGNOSIS — G40909 Epilepsy, unspecified, not intractable, without status epilepticus: Secondary | ICD-10-CM | POA: Diagnosis not present

## 2019-08-13 DIAGNOSIS — M25511 Pain in right shoulder: Secondary | ICD-10-CM | POA: Diagnosis not present

## 2019-08-13 DIAGNOSIS — G4089 Other seizures: Secondary | ICD-10-CM | POA: Diagnosis not present

## 2019-08-13 DIAGNOSIS — W19XXXA Unspecified fall, initial encounter: Secondary | ICD-10-CM | POA: Diagnosis not present

## 2019-08-13 DIAGNOSIS — S40011A Contusion of right shoulder, initial encounter: Secondary | ICD-10-CM | POA: Diagnosis not present

## 2019-08-15 ENCOUNTER — Emergency Department (HOSPITAL_COMMUNITY): Payer: Medicare HMO

## 2019-08-15 ENCOUNTER — Emergency Department (HOSPITAL_COMMUNITY)
Admission: EM | Admit: 2019-08-15 | Discharge: 2019-08-15 | Disposition: A | Discharge status: Home / Self Care | Payer: Medicare HMO | Attending: Emergency Medicine | Admitting: Emergency Medicine

## 2019-08-15 ENCOUNTER — Other Ambulatory Visit: Payer: Self-pay

## 2019-08-15 DIAGNOSIS — E119 Type 2 diabetes mellitus without complications: Secondary | ICD-10-CM | POA: Diagnosis not present

## 2019-08-15 DIAGNOSIS — J45909 Unspecified asthma, uncomplicated: Secondary | ICD-10-CM | POA: Diagnosis not present

## 2019-08-15 DIAGNOSIS — J329 Chronic sinusitis, unspecified: Secondary | ICD-10-CM | POA: Diagnosis not present

## 2019-08-15 DIAGNOSIS — E1165 Type 2 diabetes mellitus with hyperglycemia: Secondary | ICD-10-CM | POA: Diagnosis not present

## 2019-08-15 DIAGNOSIS — I10 Essential (primary) hypertension: Secondary | ICD-10-CM | POA: Diagnosis not present

## 2019-08-15 DIAGNOSIS — Y92238 Other place in hospital as the place of occurrence of the external cause: Secondary | ICD-10-CM | POA: Diagnosis not present

## 2019-08-15 DIAGNOSIS — G40909 Epilepsy, unspecified, not intractable, without status epilepticus: Secondary | ICD-10-CM | POA: Diagnosis not present

## 2019-08-15 DIAGNOSIS — R404 Transient alteration of awareness: Secondary | ICD-10-CM | POA: Diagnosis not present

## 2019-08-15 DIAGNOSIS — Z794 Long term (current) use of insulin: Secondary | ICD-10-CM | POA: Insufficient documentation

## 2019-08-15 DIAGNOSIS — R58 Hemorrhage, not elsewhere classified: Secondary | ICD-10-CM | POA: Diagnosis not present

## 2019-08-15 DIAGNOSIS — Z79899 Other long term (current) drug therapy: Secondary | ICD-10-CM | POA: Insufficient documentation

## 2019-08-15 DIAGNOSIS — R569 Unspecified convulsions: Secondary | ICD-10-CM | POA: Insufficient documentation

## 2019-08-15 DIAGNOSIS — S0083XA Contusion of other part of head, initial encounter: Secondary | ICD-10-CM | POA: Diagnosis not present

## 2019-08-15 DIAGNOSIS — X58XXXA Exposure to other specified factors, initial encounter: Secondary | ICD-10-CM | POA: Insufficient documentation

## 2019-08-15 DIAGNOSIS — J3489 Other specified disorders of nose and nasal sinuses: Secondary | ICD-10-CM | POA: Diagnosis not present

## 2019-08-15 LAB — CBC WITH DIFFERENTIAL/PLATELET
Abs Immature Granulocytes: 0.02 10*3/uL (ref 0.00–0.07)
Basophils Absolute: 0 10*3/uL (ref 0.0–0.1)
Basophils Relative: 1 %
Eosinophils Absolute: 0.1 10*3/uL (ref 0.0–0.5)
Eosinophils Relative: 3 %
HCT: 41.4 % (ref 36.0–46.0)
Hemoglobin: 12.8 g/dL (ref 12.0–15.0)
Immature Granulocytes: 1 %
Lymphocytes Relative: 33 %
Lymphs Abs: 1.3 10*3/uL (ref 0.7–4.0)
MCH: 25.7 pg — ABNORMAL LOW (ref 26.0–34.0)
MCHC: 30.9 g/dL (ref 30.0–36.0)
MCV: 83 fL (ref 80.0–100.0)
Monocytes Absolute: 0.2 10*3/uL (ref 0.1–1.0)
Monocytes Relative: 5 %
Neutro Abs: 2.3 10*3/uL (ref 1.7–7.7)
Neutrophils Relative %: 57 %
Platelets: 107 10*3/uL — ABNORMAL LOW (ref 150–400)
RBC: 4.99 MIL/uL (ref 3.87–5.11)
RDW: 17 % — ABNORMAL HIGH (ref 11.5–15.5)
WBC: 4 10*3/uL (ref 4.0–10.5)
nRBC: 0 % (ref 0.0–0.2)

## 2019-08-15 LAB — COMPREHENSIVE METABOLIC PANEL
ALT: 45 U/L — ABNORMAL HIGH (ref 0–44)
AST: 60 U/L — ABNORMAL HIGH (ref 15–41)
Albumin: 4 g/dL (ref 3.5–5.0)
Alkaline Phosphatase: 101 U/L (ref 38–126)
Anion gap: 15 (ref 5–15)
BUN: 8 mg/dL (ref 6–20)
CO2: 24 mmol/L (ref 22–32)
Calcium: 9.2 mg/dL (ref 8.9–10.3)
Chloride: 97 mmol/L — ABNORMAL LOW (ref 98–111)
Creatinine, Ser: 0.43 mg/dL — ABNORMAL LOW (ref 0.44–1.00)
GFR calc Af Amer: >60 mL/min (ref >60)
GFR calc non Af Amer: >60 mL/min (ref >60)
Glucose, Bld: 264 mg/dL — ABNORMAL HIGH (ref 70–99)
Potassium: 4.4 mmol/L (ref 3.5–5.1)
Sodium: 136 mmol/L (ref 135–145)
Total Bilirubin: 0.3 mg/dL (ref 0.3–1.2)
Total Protein: 7.9 g/dL (ref 6.5–8.1)

## 2019-08-15 LAB — ETHANOL: Alcohol, Ethyl (B): <10 mg/dL (ref <10)

## 2019-08-15 LAB — CBG MONITORING, ED: Glucose-Capillary: 256 mg/dL — ABNORMAL HIGH (ref 70–99)

## 2019-08-15 LAB — I-STAT BETA HCG BLOOD, ED (MC, WL, AP ONLY): I-stat hCG, quantitative: <5 m[IU]/mL (ref <5)

## 2019-08-15 NOTE — ED Notes (Signed)
Pt transported to CT ?

## 2019-08-15 NOTE — ED Notes (Signed)
Remi Haggard, [phone] would like a phone call.

## 2019-08-15 NOTE — ED Provider Notes (Signed)
[place] DEPT Provider Note   CSN: [id number] Arrival date & time: 08/15/19  1709     History Chief Complaint  Patient presents with  . Seizures  . Facial Injury    Tina Saunders is a 34 y.o. female.  HPI   34 year old female initially presented with seizure.  I was emergently called triage room after she was found laying on the floor beside the triage chair.  CODE BLUE was called by staff member who found her.  I arrived less than 3 seconds later.  She had a pulse.  She is breathing on her own.  She is open her eyes when I talk to her although she is not responding verbally or following commands.  We lifted her onto a stretcher to get her back into a treatment room.  She seems to be protecting her airway.  Glucose okay.  Past Medical History:  Diagnosis Date  . Anxiety   . Asthma   . Bipolar 1 disorder ([place])   . Cancer of abdominal wall   . Depression   . Diabetes mellitus without complication ([place])   . High cholesterol   . Hypertension   . Intentional drug overdose ([place]) 07/26/2017   Seroquel and buspirone   . Obesity   . Obesity   . Polycystic ovarian syndrome 07/01/2011   Patient report  . 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 Schizophrenia ([place])   . Seizures [facility])     Patient Active Problem List   Diagnosis Date Noted  . Polypharmacy 08/29/2018  . Abnormal uterine bleeding (AUB) 07/29/2018  . S/P ovarian cystectomy 07/29/2018  . 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 Adjustment disorder with mixed disturbance of emotions and conduct   . QT prolongation   . Suicide attempt ([place])   . Overdose of antidepressant, intentional self-harm, initial encounter ([place])   . Overdose 07/26/2017  . Suicide attempt by other psychotropic drug overdose ([place])   . DKA (diabetic ketoacidoses) ([place]) 03/14/2017  . Chronic constipation   .  Seizures ([place]) 12/11/2016  . Diabetes mellitus type 2 in obese ([place]) 12/11/2016  . Uncontrolled diabetes mellitus ([place]) 11/03/2016  . Schizoaffective disorder, bipolar type ([place]) 11/02/2016  . 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 Suicidal ideation   . Hepatic steatosis 02/06/2014  . Splenomegaly 02/06/2014  . 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 HTN (hypertension) 04/15/2013  . Posttraumatic stress disorder 12/07/2011  . PSVT (paroxysmal supraventricular tachycardia) ([place]) 08/26/2011  . ADHD 09/23/2007  . EPIGASTRIC PAIN 09/23/2007  . ADHD 09/23/2007  . Obesity, unspecified 07/30/2007  . Schizoaffective disorder, depressive type ([place]) 07/30/2007  . SLEEP DISORDER 07/30/2007  . METRORRHAGIA 06/13/2006  . DISORDER, MENSTRUAL NEC 06/13/2006  . Disequilibrium 06/13/2006  . POLYCYSTIC OVARIAN DISEASE 04/25/2006  . AMENORRHEA, SECONDARY 04/20/2006  . ACNE, MILD 04/20/2006    Past Surgical History:  Procedure Laterality Date  . CHOLECYSTECTOMY    . COLONOSCOPY WITH PROPOFOL N/A 03/08/2017   Procedure: COLONOSCOPY WITH PROPOFOL;  Surgeon: Milus Banister, MD;  Location: [facility] ENDOSCOPY;  Service: Endoscopy;  Laterality: N/A;  . HERNIA REPAIR    . Ovarian Cyst Excision    . VARICOSE VEIN SURGERY       OB History    Gravida  0   Para      Term      Preterm      AB  Living        SAB      TAB      Ectopic      Multiple      Live Births              Family History  Problem Relation Age of Onset  . Coronary artery disease Maternal Grandmother   . Diabetes type II Maternal Grandmother   . Cancer Maternal Grandmother   . Hypertension Mother   . Hypertension Father     Social History   Tobacco Use  . Smoking status: Never Smoker  . Smokeless tobacco: Never Used  Vaping Use  . Vaping Use: Never used  Substance Use Topics  . Alcohol use: No  . Drug use: No    Home Medications Prior to Admission medications   Medication Sig Start  Date End Date Taking? Authorizing Provider  atorvastatin (LIPITOR) 40 MG tablet Take 1 tablet (40 mg total) by mouth at bedtime. For high cholesterol 12/06/17   Lindell Spar I, NP  bisacodyl (DULCOLAX) 5 MG EC tablet Take 2 tablets (10 mg total) by mouth daily as needed for moderate constipation. (May buy from over the counter): For constipation 12/06/17   Lindell Spar I, NP  busPIRone (BUSPAR) 30 MG tablet Take 45 mg by mouth 3 (three) times daily.    [provider]  chlorproMAZINE (THORAZINE) 25 MG tablet Take 25 mg by mouth 3 (three) times daily. 01/27/19   [provider]  Docusate Sodium (DSS) 100 MG CAPS Take 1 (one) Tablet two times daily, as needed for constipation 08/15/18   [provider]  gabapentin (NEURONTIN) 400 MG capsule Take 400 mg by mouth 3 (three) times daily.    [provider]  insulin aspart (NOVOLOG) 100 UNIT/ML injection Inject 30 Units into the skin 3 (three) times daily with meals. For diabetes management Patient taking differently: Inject 40 Units into the skin 3 (three) times daily with meals. For diabetes management 12/06/17   Lindell Spar I, NP  insulin detemir (LEVEMIR) 100 UNIT/ML injection Inject 0.7 mLs (70 Units total) into the skin 2 (two) times daily. For diabetes management Patient taking differently: Inject 95 Units into the skin 2 (two) times daily. For diabetes management 12/06/17   Lindell Spar I, NP  Insulin Syringe-Needle U-100 25G X 1" 1 ML MISC 1 Syringe by Does not apply route 4 (four) times daily -  before meals and at bedtime. For blood sugar checks 12/06/17   Lindell Spar I, NP  lamoTRIgine (LAMICTAL) 150 MG tablet Take 1 tablet (150 mg total) by mouth 2 (two) times daily. 12/26/18   Suzzanne Cloud, NP  levETIRAcetam (KEPPRA) 1000 MG tablet Take 1 tablet (1,000 mg total) by mouth 2 (two) times daily. 08/29/18   Marcial Pacas, MD  lisinopril (ZESTRIL) 10 MG tablet Take 10 mg by mouth daily.    [provider]   magnesium oxide (MAG-OX) 400 (241.3 Mg) MG tablet Take 1 tablet (400 mg total) by mouth daily. For acid rflux 12/07/17   Lindell Spar I, NP  Melatonin 3 MG TABS Take 1 tablet (3 mg total) by mouth at bedtime. For sleep 12/06/17   Lindell Spar I, NP  metoprolol tartrate (LOPRESSOR) 25 MG tablet Take 1 tablet (25 mg total) by mouth 2 (two) times daily. For high blood pressure 12/06/17   Nwoko, Herbert Pun I, NP  norethindrone (MICRONOR) 0.35 MG tablet Take 1 tablet by mouth daily.  10/29/18 02/08/19  [provider]  pantoprazole (PROTONIX) 40 MG tablet Take 40 mg by mouth daily.    [provider]  QUEtiapine (SEROQUEL XR) 200 MG 24 hr tablet Take 450 mg by mouth at bedtime.    [provider]  QUEtiapine (SEROQUEL) 100 MG tablet Take 1 tablet (100 mg total) by mouth daily. For mood control 12/06/17   Lindell Spar I, NP  sertraline (ZOLOFT) 100 MG tablet Take 2 tablets (200 mg total) by mouth daily. Fordepression 12/07/17   Lindell Spar I, NP  TOUJEO MAX SOLOSTAR 300 UNIT/ML SOPN  12/26/18   [provider]  traZODone (DESYREL) 50 MG tablet Take 50 mg by mouth at bedtime.    [provider]    Allergies    Fish-derived products, Geodon [ziprasidone hcl], Haldol [haloperidol lactate], Buprenorphine hcl, Compazine [prochlorperazine], Morphine and related, and Toradol [ketorolac tromethamine]  Review of Systems   Review of Systems All systems reviewed and negative, other than as noted in HPI.  Physical Exam Updated Vital Signs BP 127/74 (BP Location: Left Arm)   Pulse 84   Temp 97.8 F (36.6 C) (Oral)   Resp 18   Ht 5\' 4"  (1.626 m)   Wt (!) 113.4 kg   LMP 07/21/2019 (Approximate)   SpO2 98%   BMI 42.91 kg/m   Physical Exam Vitals and nursing note reviewed.  Constitutional:      Appearance: She is well-developed. She is obese.  HENT:     Head: Normocephalic.     Nose:     Comments: Dried blood bilateral nares.  Oropharynx clear.  No active  bleeding noted. Eyes:     General:        Right eye: No discharge.        Left eye: No discharge.     Conjunctiva/sclera: Conjunctivae normal.  Cardiovascular:     Rate and Rhythm: Normal rate and regular rhythm.     Heart sounds: Normal heart sounds. No murmur heard.  No friction rub. No gallop.   Pulmonary:     Effort: Pulmonary effort is normal. No respiratory distress.     Breath sounds: Normal breath sounds.  Abdominal:     General: There is no distension.     Palpations: Abdomen is soft.     Tenderness: There is no abdominal tenderness.  Musculoskeletal:        General: No tenderness.     Cervical back: Neck supple.  Skin:    General: Skin is warm and dry.  Neurological:     Comments: Laying on ground on her side.  She seems to be laying in position of comfort though.  Her left arm is extended out in front of her and head is resting on it.  Right arm is flexed and held across her chest.  Opens eyes to voice but is not responding verbally or following commands.     ED Results / Procedures / Treatments   Labs (all labs ordered are listed, but only abnormal results are displayed) Labs Reviewed  CBC WITH DIFFERENTIAL/PLATELET - Abnormal; Notable for the following components:      Result Value   MCH 25.7 (*)    RDW 17.0 (*)    Platelets 107 (*)    All other components within normal limits  COMPREHENSIVE METABOLIC PANEL - Abnormal; Notable for the following components:   Chloride 97 (*)    Glucose, Bld 264 (*)    Creatinine, Ser 0.43 (*)    AST 60 (*)  ALT 45 (*)    All other components within normal limits  CBG MONITORING, ED - Abnormal; Notable for the following components:   Glucose-Capillary 256 (*)    All other components within normal limits  ETHANOL  LAMOTRIGINE LEVEL  I-STAT BETA HCG BLOOD, ED ([facility], [facility], [facility] ONLY)    EKG EKG Interpretation  Date/Time:  Friday August 15 2019 17:57:44 EDT Ventricular Rate:  83 PR Interval:    QRS Duration: 105 QT  Interval:  387 QTC Calculation: 455 R Axis:   -4 Text Interpretation: Sinus rhythm Confirmed by Virgel Manifold [phone]) on 08/15/2019 6:07:10 PM   Radiology No results found.   CT Head Wo Contrast  Result Date: 08/15/2019 CLINICAL DATA:  Seizure EXAM: CT HEAD WITHOUT CONTRAST TECHNIQUE: Contiguous axial images were obtained from the base of the skull through the vertex without intravenous contrast. COMPARISON:  MRI 11/11/2018, CT 07/22/2019 FINDINGS: Brain: No acute territorial infarction, hemorrhage or intracranial mass. The ventricles are nonenlarged. Vascular: No hyperdense vessel or unexpected calcification. Skull: Normal. Negative for fracture or focal lesion. Sinuses/Orbits: Mucosal thickening in the right maxillary sinus and ethmoid sinus. Other: None IMPRESSION: 1. Negative non contrasted CT appearance of the brain. 2. Sinus disease. Electronically Signed   By: Donavan Foil M.D.   On: 08/15/2019 18:22     Procedures Procedures (including critical care time)  Medications Ordered in ED Medications - No data to display  ED Course  I have reviewed the triage vital signs and the nursing notes.  Pertinent labs & imaging results that were available during my care of the patient were reviewed by me and considered in my medical decision making (see chart for details).    MDM Rules/Calculators/A&P                          34 year old female with seizure.  Question seizure shortly after arrival to the emergency room versus pseudoseizure.  Regardless, she is not back to her baseline.  She has no further complaints.  Some dried blood in her nose but no active bleeding.  She denies any acute pain.  She is alert and talking on the phone with her sister and caretaker.  Work-up fairly reassuring.  We will send a Lamictal level possibly aid follow-up providers.  I am not sure if she is on this for seizure or psychiatric reasons though.  Return precautions discussed.  Outpatient follow-up  otherwise.  Final Clinical Impression(s) / ED Diagnoses Final diagnoses:  Seizure ([place])  Contusion of face, initial encounter    Rx / DC Orders ED Discharge Orders    None       Virgel Manifold, MD 08/18/19 [phone]

## 2019-08-15 NOTE — ED Triage Notes (Signed)
Per EMS: Patient is coming from home with a complaint of Seizure and facial trauma.  Patient has a hx of seizures. Patient last remembers peeling eggs in the kitchen. Patient's friend called 911, as patient hit her nose. Patient has dried blood in the nose. Patient takes [place]. Patient has been to the hospital every day for the last 3 days after taking a diet supplement. Patient's Keppra was increased to 1500mg  twice per day.

## 2019-08-15 NOTE — ED Notes (Signed)
Patients friend Ms. Jimmye Norman would like an update if patient permits. Phone # [phone]

## 2019-08-18 LAB — LAMOTRIGINE LEVEL: Lamotrigine Lvl: 3.4 ug/mL (ref 2.0–20.0)

## 2019-08-20 DIAGNOSIS — Z20822 Contact with and (suspected) exposure to covid-19: Secondary | ICD-10-CM | POA: Diagnosis not present

## 2019-08-20 DIAGNOSIS — R197 Diarrhea, unspecified: Secondary | ICD-10-CM | POA: Diagnosis not present

## 2019-08-24 DIAGNOSIS — R456 Violent behavior: Secondary | ICD-10-CM | POA: Diagnosis not present

## 2019-08-24 DIAGNOSIS — T1491XA Suicide attempt, initial encounter: Secondary | ICD-10-CM | POA: Diagnosis not present

## 2019-08-24 DIAGNOSIS — F29 Unspecified psychosis not due to a substance or known physiological condition: Secondary | ICD-10-CM | POA: Diagnosis not present

## 2019-08-24 DIAGNOSIS — T383X2A Poisoning by insulin and oral hypoglycemic [antidiabetic] drugs, intentional self-harm, initial encounter: Secondary | ICD-10-CM | POA: Diagnosis not present

## 2019-08-25 DIAGNOSIS — I10 Essential (primary) hypertension: Secondary | ICD-10-CM | POA: Diagnosis not present

## 2019-08-25 DIAGNOSIS — F419 Anxiety disorder, unspecified: Secondary | ICD-10-CM | POA: Diagnosis not present

## 2019-08-25 DIAGNOSIS — E876 Hypokalemia: Secondary | ICD-10-CM | POA: Diagnosis not present

## 2019-08-25 DIAGNOSIS — Z6841 Body Mass Index (BMI) 40.0 and over, adult: Secondary | ICD-10-CM | POA: Diagnosis not present

## 2019-08-25 DIAGNOSIS — F319 Bipolar disorder, unspecified: Secondary | ICD-10-CM | POA: Diagnosis not present

## 2019-08-25 DIAGNOSIS — L0100 Impetigo, unspecified: Secondary | ICD-10-CM | POA: Diagnosis not present

## 2019-08-25 DIAGNOSIS — T1491XA Suicide attempt, initial encounter: Secondary | ICD-10-CM | POA: Diagnosis not present

## 2019-08-25 DIAGNOSIS — T383X2A Poisoning by insulin and oral hypoglycemic [antidiabetic] drugs, intentional self-harm, initial encounter: Secondary | ICD-10-CM | POA: Diagnosis not present

## 2019-08-25 DIAGNOSIS — G40909 Epilepsy, unspecified, not intractable, without status epilepticus: Secondary | ICD-10-CM | POA: Diagnosis not present

## 2019-08-25 DIAGNOSIS — K2100 Gastro-esophageal reflux disease with esophagitis, without bleeding: Secondary | ICD-10-CM | POA: Diagnosis not present

## 2019-08-25 DIAGNOSIS — F259 Schizoaffective disorder, unspecified: Secondary | ICD-10-CM | POA: Diagnosis not present

## 2019-08-27 DIAGNOSIS — E1165 Type 2 diabetes mellitus with hyperglycemia: Secondary | ICD-10-CM | POA: Diagnosis not present

## 2019-08-27 DIAGNOSIS — T7840XA Allergy, unspecified, initial encounter: Secondary | ICD-10-CM | POA: Diagnosis not present

## 2019-08-27 DIAGNOSIS — E119 Type 2 diabetes mellitus without complications: Secondary | ICD-10-CM | POA: Diagnosis not present

## 2019-08-27 DIAGNOSIS — Z6841 Body Mass Index (BMI) 40.0 and over, adult: Secondary | ICD-10-CM | POA: Diagnosis not present

## 2019-08-27 DIAGNOSIS — R52 Pain, unspecified: Secondary | ICD-10-CM | POA: Diagnosis not present

## 2019-08-27 DIAGNOSIS — F29 Unspecified psychosis not due to a substance or known physiological condition: Secondary | ICD-10-CM | POA: Diagnosis not present

## 2019-08-27 DIAGNOSIS — R21 Rash and other nonspecific skin eruption: Secondary | ICD-10-CM | POA: Diagnosis not present

## 2019-08-27 DIAGNOSIS — R0902 Hypoxemia: Secondary | ICD-10-CM | POA: Diagnosis not present

## 2019-08-27 DIAGNOSIS — R45851 Suicidal ideations: Secondary | ICD-10-CM | POA: Diagnosis not present

## 2019-08-27 DIAGNOSIS — L509 Urticaria, unspecified: Secondary | ICD-10-CM | POA: Diagnosis not present

## 2019-08-27 DIAGNOSIS — F332 Major depressive disorder, recurrent severe without psychotic features: Secondary | ICD-10-CM | POA: Diagnosis not present

## 2019-08-27 DIAGNOSIS — B86 Scabies: Secondary | ICD-10-CM | POA: Diagnosis not present

## 2019-08-27 DIAGNOSIS — L299 Pruritus, unspecified: Secondary | ICD-10-CM | POA: Diagnosis not present

## 2019-08-27 DIAGNOSIS — I10 Essential (primary) hypertension: Secondary | ICD-10-CM | POA: Diagnosis not present

## 2019-08-27 DIAGNOSIS — T383X2A Poisoning by insulin and oral hypoglycemic [antidiabetic] drugs, intentional self-harm, initial encounter: Secondary | ICD-10-CM | POA: Diagnosis not present

## 2019-08-27 DIAGNOSIS — G40909 Epilepsy, unspecified, not intractable, without status epilepticus: Secondary | ICD-10-CM | POA: Diagnosis not present

## 2019-08-27 DIAGNOSIS — L0100 Impetigo, unspecified: Secondary | ICD-10-CM | POA: Diagnosis not present

## 2019-08-27 DIAGNOSIS — L309 Dermatitis, unspecified: Secondary | ICD-10-CM | POA: Diagnosis not present

## 2019-08-27 DIAGNOSIS — T783XXA Angioneurotic edema, initial encounter: Secondary | ICD-10-CM | POA: Diagnosis not present

## 2019-08-27 DIAGNOSIS — E785 Hyperlipidemia, unspecified: Secondary | ICD-10-CM | POA: Diagnosis not present

## 2019-08-27 DIAGNOSIS — F17210 Nicotine dependence, cigarettes, uncomplicated: Secondary | ICD-10-CM | POA: Diagnosis not present

## 2019-08-27 DIAGNOSIS — E1140 Type 2 diabetes mellitus with diabetic neuropathy, unspecified: Secondary | ICD-10-CM | POA: Diagnosis not present

## 2019-08-27 DIAGNOSIS — F259 Schizoaffective disorder, unspecified: Secondary | ICD-10-CM | POA: Diagnosis not present

## 2019-08-29 DIAGNOSIS — L309 Dermatitis, unspecified: Secondary | ICD-10-CM | POA: Diagnosis not present

## 2019-08-29 DIAGNOSIS — L0100 Impetigo, unspecified: Secondary | ICD-10-CM | POA: Diagnosis not present

## 2019-09-12 DIAGNOSIS — E1165 Type 2 diabetes mellitus with hyperglycemia: Secondary | ICD-10-CM | POA: Diagnosis not present

## 2019-09-12 DIAGNOSIS — E1140 Type 2 diabetes mellitus with diabetic neuropathy, unspecified: Secondary | ICD-10-CM | POA: Diagnosis not present

## 2019-09-12 DIAGNOSIS — R569 Unspecified convulsions: Secondary | ICD-10-CM | POA: Diagnosis not present

## 2019-09-12 DIAGNOSIS — B86 Scabies: Secondary | ICD-10-CM | POA: Diagnosis not present

## 2019-09-12 DIAGNOSIS — Z79899 Other long term (current) drug therapy: Secondary | ICD-10-CM | POA: Diagnosis not present

## 2019-09-12 DIAGNOSIS — Z915 Personal history of self-harm: Secondary | ICD-10-CM | POA: Diagnosis not present

## 2019-09-12 DIAGNOSIS — N898 Other specified noninflammatory disorders of vagina: Secondary | ICD-10-CM | POA: Diagnosis not present

## 2019-09-12 DIAGNOSIS — F259 Schizoaffective disorder, unspecified: Secondary | ICD-10-CM | POA: Diagnosis not present

## 2019-09-12 DIAGNOSIS — Z6841 Body Mass Index (BMI) 40.0 and over, adult: Secondary | ICD-10-CM | POA: Diagnosis not present

## 2019-10-17 DIAGNOSIS — M545 Low back pain: Secondary | ICD-10-CM | POA: Diagnosis not present

## 2019-10-17 DIAGNOSIS — W19XXXA Unspecified fall, initial encounter: Secondary | ICD-10-CM | POA: Diagnosis not present

## 2019-10-17 DIAGNOSIS — R52 Pain, unspecified: Secondary | ICD-10-CM | POA: Diagnosis not present

## 2019-10-17 DIAGNOSIS — E1165 Type 2 diabetes mellitus with hyperglycemia: Secondary | ICD-10-CM | POA: Diagnosis not present

## 2019-10-17 DIAGNOSIS — M549 Dorsalgia, unspecified: Secondary | ICD-10-CM | POA: Diagnosis not present

## 2019-12-01 ENCOUNTER — Institutional Professional Consult (permissible substitution): Payer: Medicaid Other | Admitting: Neurology

## 2019-12-01 ENCOUNTER — Encounter: Payer: Self-pay | Admitting: Neurology

## 2019-12-01 ENCOUNTER — Telehealth: Payer: Self-pay | Admitting: *Deleted

## 2019-12-01 NOTE — Telephone Encounter (Signed)
No showed consultation appointment. 

## 2019-12-05 DIAGNOSIS — R0902 Hypoxemia: Secondary | ICD-10-CM | POA: Diagnosis not present

## 2019-12-05 DIAGNOSIS — R52 Pain, unspecified: Secondary | ICD-10-CM | POA: Diagnosis not present

## 2019-12-05 DIAGNOSIS — R4020 Unspecified coma: Secondary | ICD-10-CM | POA: Diagnosis not present

## 2019-12-05 DIAGNOSIS — G4489 Other headache syndrome: Secondary | ICD-10-CM | POA: Diagnosis not present

## 2019-12-05 DIAGNOSIS — R1031 Right lower quadrant pain: Secondary | ICD-10-CM | POA: Diagnosis not present

## 2019-12-05 DIAGNOSIS — E1165 Type 2 diabetes mellitus with hyperglycemia: Secondary | ICD-10-CM | POA: Diagnosis not present

## 2019-12-05 DIAGNOSIS — R55 Syncope and collapse: Secondary | ICD-10-CM | POA: Diagnosis not present

## 2019-12-29 DIAGNOSIS — Z5321 Procedure and treatment not carried out due to patient leaving prior to being seen by health care provider: Secondary | ICD-10-CM | POA: Diagnosis not present

## 2019-12-29 DIAGNOSIS — E1165 Type 2 diabetes mellitus with hyperglycemia: Secondary | ICD-10-CM | POA: Diagnosis not present

## 2019-12-29 DIAGNOSIS — H9209 Otalgia, unspecified ear: Secondary | ICD-10-CM | POA: Diagnosis not present

## 2019-12-29 DIAGNOSIS — R197 Diarrhea, unspecified: Secondary | ICD-10-CM | POA: Diagnosis not present

## 2019-12-29 DIAGNOSIS — R1111 Vomiting without nausea: Secondary | ICD-10-CM | POA: Diagnosis not present

## 2019-12-29 DIAGNOSIS — R069 Unspecified abnormalities of breathing: Secondary | ICD-10-CM | POA: Diagnosis not present

## 2019-12-29 DIAGNOSIS — Z794 Long term (current) use of insulin: Secondary | ICD-10-CM | POA: Diagnosis not present

## 2019-12-29 DIAGNOSIS — R110 Nausea: Secondary | ICD-10-CM | POA: Diagnosis not present

## 2019-12-31 DIAGNOSIS — R5383 Other fatigue: Secondary | ICD-10-CM | POA: Diagnosis not present

## 2019-12-31 DIAGNOSIS — R410 Disorientation, unspecified: Secondary | ICD-10-CM | POA: Diagnosis not present

## 2019-12-31 DIAGNOSIS — G40909 Epilepsy, unspecified, not intractable, without status epilepticus: Secondary | ICD-10-CM | POA: Diagnosis not present

## 2019-12-31 DIAGNOSIS — F29 Unspecified psychosis not due to a substance or known physiological condition: Secondary | ICD-10-CM | POA: Diagnosis not present

## 2019-12-31 DIAGNOSIS — E1065 Type 1 diabetes mellitus with hyperglycemia: Secondary | ICD-10-CM | POA: Diagnosis not present

## 2019-12-31 DIAGNOSIS — R112 Nausea with vomiting, unspecified: Secondary | ICD-10-CM | POA: Diagnosis not present

## 2019-12-31 DIAGNOSIS — F99 Mental disorder, not otherwise specified: Secondary | ICD-10-CM | POA: Diagnosis not present

## 2019-12-31 DIAGNOSIS — E7800 Pure hypercholesterolemia, unspecified: Secondary | ICD-10-CM | POA: Diagnosis not present

## 2019-12-31 DIAGNOSIS — E1165 Type 2 diabetes mellitus with hyperglycemia: Secondary | ICD-10-CM | POA: Diagnosis not present

## 2019-12-31 DIAGNOSIS — I10 Essential (primary) hypertension: Secondary | ICD-10-CM | POA: Diagnosis not present

## 2019-12-31 DIAGNOSIS — R55 Syncope and collapse: Secondary | ICD-10-CM | POA: Diagnosis not present

## 2020-02-13 DIAGNOSIS — J069 Acute upper respiratory infection, unspecified: Secondary | ICD-10-CM | POA: Diagnosis not present

## 2020-02-16 DIAGNOSIS — R069 Unspecified abnormalities of breathing: Secondary | ICD-10-CM | POA: Diagnosis not present

## 2020-02-16 DIAGNOSIS — U071 COVID-19: Secondary | ICD-10-CM | POA: Diagnosis not present

## 2020-02-16 DIAGNOSIS — R21 Rash and other nonspecific skin eruption: Secondary | ICD-10-CM | POA: Diagnosis not present

## 2020-02-16 DIAGNOSIS — I10 Essential (primary) hypertension: Secondary | ICD-10-CM | POA: Diagnosis not present

## 2020-02-16 DIAGNOSIS — J9600 Acute respiratory failure, unspecified whether with hypoxia or hypercapnia: Secondary | ICD-10-CM | POA: Diagnosis not present

## 2020-02-16 DIAGNOSIS — R0602 Shortness of breath: Secondary | ICD-10-CM | POA: Diagnosis not present

## 2020-02-16 DIAGNOSIS — Z6841 Body Mass Index (BMI) 40.0 and over, adult: Secondary | ICD-10-CM | POA: Diagnosis not present

## 2020-02-16 DIAGNOSIS — E1165 Type 2 diabetes mellitus with hyperglycemia: Secondary | ICD-10-CM | POA: Diagnosis not present

## 2020-02-16 DIAGNOSIS — R0902 Hypoxemia: Secondary | ICD-10-CM | POA: Diagnosis not present

## 2020-02-16 DIAGNOSIS — R739 Hyperglycemia, unspecified: Secondary | ICD-10-CM | POA: Diagnosis not present

## 2020-02-16 DIAGNOSIS — R059 Cough, unspecified: Secondary | ICD-10-CM | POA: Diagnosis not present

## 2020-02-16 DIAGNOSIS — R509 Fever, unspecified: Secondary | ICD-10-CM | POA: Diagnosis not present

## 2020-02-16 DIAGNOSIS — F250 Schizoaffective disorder, bipolar type: Secondary | ICD-10-CM | POA: Diagnosis not present

## 2020-02-16 DIAGNOSIS — G40909 Epilepsy, unspecified, not intractable, without status epilepticus: Secondary | ICD-10-CM | POA: Diagnosis not present

## 2020-02-16 DIAGNOSIS — J1282 Pneumonia due to coronavirus disease 2019: Secondary | ICD-10-CM | POA: Diagnosis not present

## 2020-02-16 DIAGNOSIS — J9601 Acute respiratory failure with hypoxia: Secondary | ICD-10-CM | POA: Diagnosis not present

## 2020-02-17 DIAGNOSIS — J1282 Pneumonia due to coronavirus disease 2019: Secondary | ICD-10-CM | POA: Diagnosis not present

## 2020-02-17 DIAGNOSIS — J9601 Acute respiratory failure with hypoxia: Secondary | ICD-10-CM | POA: Diagnosis not present

## 2020-02-17 DIAGNOSIS — U071 COVID-19: Secondary | ICD-10-CM | POA: Diagnosis not present

## 2020-02-19 DIAGNOSIS — U071 COVID-19: Secondary | ICD-10-CM | POA: Diagnosis not present

## 2020-03-09 DIAGNOSIS — M7061 Trochanteric bursitis, right hip: Secondary | ICD-10-CM | POA: Diagnosis not present

## 2020-03-15 DIAGNOSIS — M1611 Unilateral primary osteoarthritis, right hip: Secondary | ICD-10-CM | POA: Diagnosis not present

## 2020-03-15 DIAGNOSIS — M7061 Trochanteric bursitis, right hip: Secondary | ICD-10-CM | POA: Diagnosis not present

## 2020-03-21 DIAGNOSIS — U071 COVID-19: Secondary | ICD-10-CM | POA: Diagnosis not present

## 2020-03-31 DIAGNOSIS — R569 Unspecified convulsions: Secondary | ICD-10-CM | POA: Diagnosis not present

## 2020-03-31 DIAGNOSIS — E1169 Type 2 diabetes mellitus with other specified complication: Secondary | ICD-10-CM | POA: Diagnosis not present

## 2020-03-31 DIAGNOSIS — E669 Obesity, unspecified: Secondary | ICD-10-CM | POA: Diagnosis not present

## 2020-03-31 DIAGNOSIS — U071 COVID-19: Secondary | ICD-10-CM | POA: Diagnosis not present

## 2020-03-31 DIAGNOSIS — M7631 Iliotibial band syndrome, right leg: Secondary | ICD-10-CM | POA: Diagnosis not present

## 2020-03-31 DIAGNOSIS — I10 Essential (primary) hypertension: Secondary | ICD-10-CM | POA: Diagnosis not present

## 2020-03-31 DIAGNOSIS — J1282 Pneumonia due to coronavirus disease 2019: Secondary | ICD-10-CM | POA: Diagnosis not present

## 2020-03-31 DIAGNOSIS — F259 Schizoaffective disorder, unspecified: Secondary | ICD-10-CM | POA: Diagnosis not present

## 2020-04-06 DIAGNOSIS — R159 Full incontinence of feces: Secondary | ICD-10-CM | POA: Insufficient documentation

## 2020-04-12 DIAGNOSIS — U071 COVID-19: Secondary | ICD-10-CM | POA: Diagnosis not present

## 2020-04-18 DIAGNOSIS — U071 COVID-19: Secondary | ICD-10-CM | POA: Diagnosis not present

## 2020-04-27 DIAGNOSIS — E782 Mixed hyperlipidemia: Secondary | ICD-10-CM | POA: Insufficient documentation

## 2020-04-28 DIAGNOSIS — M5431 Sciatica, right side: Secondary | ICD-10-CM | POA: Insufficient documentation

## 2020-04-28 DIAGNOSIS — M47816 Spondylosis without myelopathy or radiculopathy, lumbar region: Secondary | ICD-10-CM | POA: Insufficient documentation

## 2020-05-15 ENCOUNTER — Emergency Department (HOSPITAL_COMMUNITY)
Admission: EM | Admit: 2020-05-15 | Discharge: 2020-05-15 | Disposition: A | Discharge status: Home / Self Care | Payer: Medicare HMO | Attending: Emergency Medicine | Admitting: Emergency Medicine

## 2020-05-15 ENCOUNTER — Other Ambulatory Visit: Payer: Self-pay

## 2020-05-15 DIAGNOSIS — R739 Hyperglycemia, unspecified: Secondary | ICD-10-CM

## 2020-05-15 DIAGNOSIS — E1165 Type 2 diabetes mellitus with hyperglycemia: Secondary | ICD-10-CM | POA: Diagnosis not present

## 2020-05-15 DIAGNOSIS — R4182 Altered mental status, unspecified: Secondary | ICD-10-CM | POA: Diagnosis not present

## 2020-05-15 DIAGNOSIS — Z794 Long term (current) use of insulin: Secondary | ICD-10-CM | POA: Insufficient documentation

## 2020-05-15 DIAGNOSIS — Z20822 Contact with and (suspected) exposure to covid-19: Secondary | ICD-10-CM | POA: Insufficient documentation

## 2020-05-15 DIAGNOSIS — Z85028 Personal history of other malignant neoplasm of stomach: Secondary | ICD-10-CM | POA: Insufficient documentation

## 2020-05-15 DIAGNOSIS — J45909 Unspecified asthma, uncomplicated: Secondary | ICD-10-CM | POA: Insufficient documentation

## 2020-05-15 DIAGNOSIS — R000 Tachycardia, unspecified: Secondary | ICD-10-CM | POA: Diagnosis not present

## 2020-05-15 DIAGNOSIS — E860 Dehydration: Secondary | ICD-10-CM | POA: Insufficient documentation

## 2020-05-15 DIAGNOSIS — I10 Essential (primary) hypertension: Secondary | ICD-10-CM | POA: Insufficient documentation

## 2020-05-15 DIAGNOSIS — Z79899 Other long term (current) drug therapy: Secondary | ICD-10-CM | POA: Insufficient documentation

## 2020-05-15 LAB — BLOOD GAS, VENOUS
Acid-base deficit: 0.3 mmol/L (ref 0.0–2.0)
Bicarbonate: 24 mmol/L (ref 20.0–28.0)
FIO2: 21
O2 Saturation: 85.4 %
Patient temperature: 98.6
pCO2, Ven: 40.3 mmHg — ABNORMAL LOW (ref 44.0–60.0)
pH, Ven: 7.393 (ref 7.250–7.430)
pO2, Ven: 53.1 mmHg — ABNORMAL HIGH (ref 32.0–45.0)

## 2020-05-15 LAB — CBC
HCT: 36.4 % (ref 36.0–46.0)
Hemoglobin: 11.2 g/dL — ABNORMAL LOW (ref 12.0–15.0)
MCH: 25.1 pg — ABNORMAL LOW (ref 26.0–34.0)
MCHC: 30.8 g/dL (ref 30.0–36.0)
MCV: 81.6 fL (ref 80.0–100.0)
Platelets: 115 10*3/uL — ABNORMAL LOW (ref 150–400)
RBC: 4.46 MIL/uL (ref 3.87–5.11)
RDW: 15.6 % — ABNORMAL HIGH (ref 11.5–15.5)
WBC: 3.9 10*3/uL — ABNORMAL LOW (ref 4.0–10.5)
nRBC: 0 % (ref 0.0–0.2)

## 2020-05-15 LAB — I-STAT BETA HCG BLOOD, ED (MC, WL, AP ONLY): I-stat hCG, quantitative: <5 m[IU]/mL (ref <5)

## 2020-05-15 LAB — CBG MONITORING, ED
Glucose-Capillary: 447 mg/dL — ABNORMAL HIGH (ref 70–99)
Glucose-Capillary: 311 mg/dL — ABNORMAL HIGH (ref 70–99)
Glucose-Capillary: 251 mg/dL — ABNORMAL HIGH (ref 70–99)
Glucose-Capillary: 198 mg/dL — ABNORMAL HIGH (ref 70–99)
Glucose-Capillary: 177 mg/dL — ABNORMAL HIGH (ref 70–99)

## 2020-05-15 LAB — COMPREHENSIVE METABOLIC PANEL
ALT: 27 U/L (ref 0–44)
AST: 34 U/L (ref 15–41)
Albumin: 3.5 g/dL (ref 3.5–5.0)
Alkaline Phosphatase: 120 U/L (ref 38–126)
Anion gap: 11 (ref 5–15)
BUN: 6 mg/dL (ref 6–20)
CO2: 21 mmol/L — ABNORMAL LOW (ref 22–32)
Calcium: 8.3 mg/dL — ABNORMAL LOW (ref 8.9–10.3)
Chloride: 102 mmol/L (ref 98–111)
Creatinine, Ser: 0.49 mg/dL (ref 0.44–1.00)
GFR, Estimated: >60 mL/min (ref >60)
Glucose, Bld: 447 mg/dL — ABNORMAL HIGH (ref 70–99)
Potassium: 4.2 mmol/L (ref 3.5–5.1)
Sodium: 134 mmol/L — ABNORMAL LOW (ref 135–145)
Total Bilirubin: 0.3 mg/dL (ref 0.3–1.2)
Total Protein: 6.7 g/dL (ref 6.5–8.1)

## 2020-05-15 LAB — BETA-HYDROXYBUTYRIC ACID: Beta-Hydroxybutyric Acid: 0.14 mmol/L (ref 0.05–0.27)

## 2020-05-15 LAB — URINALYSIS, ROUTINE W REFLEX MICROSCOPIC
Bacteria, UA: NONE SEEN
Bilirubin Urine: NEGATIVE
Glucose, UA: >=500 mg/dL — AB
Ketones, ur: NEGATIVE mg/dL
Nitrite: NEGATIVE
Protein, ur: NEGATIVE mg/dL
Specific Gravity, Urine: 1.03 (ref 1.005–1.030)
pH: 6 (ref 5.0–8.0)

## 2020-05-15 MED ORDER — DEXTROSE 50 % IV SOLN
0.0000 mL | INTRAVENOUS | Status: DC | PRN
Start: 2020-05-15 — End: 2020-05-16

## 2020-05-15 MED ORDER — LACTATED RINGERS IV SOLN: INTRAVENOUS | Status: DC

## 2020-05-15 MED ORDER — ONDANSETRON HCL 4 MG/2ML IJ SOLN
4.0000 mg | Freq: Once | INTRAMUSCULAR | Status: AC
  Administered 2020-05-15: 4 mg via INTRAVENOUS
  Filled 2020-05-15: qty 2

## 2020-05-15 MED ORDER — INSULIN REGULAR(HUMAN) IN NACL 100-0.9 UT/100ML-% IV SOLN
INTRAVENOUS | Status: DC
  Administered 2020-05-15: 19 [IU]/h via INTRAVENOUS
  Filled 2020-05-15: qty 100

## 2020-05-15 MED ORDER — FENTANYL CITRATE (PF) 100 MCG/2ML IJ SOLN
50.0000 ug | Freq: Once | INTRAMUSCULAR | Status: AC
  Administered 2020-05-15: 50 ug via INTRAVENOUS
  Filled 2020-05-15: qty 2

## 2020-05-15 MED ORDER — DEXTROSE IN LACTATED RINGERS 5 % IV SOLN: INTRAVENOUS | Status: DC

## 2020-05-15 MED ORDER — SODIUM CHLORIDE 0.9 % IV BOLUS
2000.0000 mL | Freq: Once | INTRAVENOUS | Status: AC
  Administered 2020-05-15: 2000 mL via INTRAVENOUS

## 2020-05-15 NOTE — ED Notes (Signed)
Pt. CBG 198, RN, Natosha made aware.

## 2020-05-15 NOTE — Discharge Instructions (Signed)
This was Tina Saunders's plan for your diabetes: 1. Type 2 diabetes mellitus with diabetic polyneuropathy, with long-term current use of insulin ([place]) 04/28/2020  - will plan to continue Trulicity, discussed with patient - able to tolerate slight nausea - increase levemir to 90 units nightly - continue novolog 40 units nightly  - add on novolog 5 units IF glucose before supper is >200 - check sugar twice daily - in the morning and before dinner  - work on setting up for [employer] continuous monitoring device - Monitor your pre-meal glucose and send a list if staying under 100 or over 200.  - Return for an evaluation and A1C test in about 2 months

## 2020-05-15 NOTE — ED Notes (Signed)
Patient to room 24 via Wheelchair.  Dr Joya Gaskins at the bedside for assessment

## 2020-05-15 NOTE — ED Notes (Signed)
Patient is alert and talkative.  Appears very weak when moving around in bed.  IV bolus continues as ordered

## 2020-05-15 NOTE — ED Provider Notes (Signed)
[facility] DEPT Provider Note   CSN: [id number] Arrival date & time: 05/15/20  1633     History Chief Complaint  Patient presents with  . Hyperglycemia    Tina Saunders is a 35 y.o. female.  The history is provided by the patient.  Hyperglycemia Blood sugar level PTA:  "Hi" Severity:  Severe Onset quality:  Gradual Duration:  3 days Timing:  Constant Progression:  Worsening Chronicity:  New Diabetes status:  Controlled with insulin Current diabetic therapy:  See med list Context: change in medication   Context comment:  Saw doctor 04/28/20 Relieved by:  Nothing Ineffective treatments:  None tried Associated symptoms: abdominal pain, altered mental status, dehydration, fatigue, increased thirst, malaise, nausea, polyuria and weakness   Associated symptoms: no chest pain, no confusion, no dizziness, no dysuria, no fever, no shortness of breath and no vomiting        Past Medical History:  Diagnosis Date  . Anxiety   . Asthma   . Bipolar 1 disorder ([place])   . Cancer of abdominal wall   . Depression   . Diabetes mellitus without complication ([place])   . High cholesterol   . Hypertension   . Intentional drug overdose ([place]) 07/26/2017   Seroquel and buspirone   . Obesity   . Obesity   . Polycystic ovarian syndrome 07/01/2011   Patient report  . 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 Schizophrenia ([place])   . Seizures [facility])     Patient Active Problem List   Diagnosis Date Noted  . Polypharmacy 08/29/2018  . Abnormal uterine bleeding (AUB) 07/29/2018  . S/P ovarian cystectomy 07/29/2018  . MDD (major depressive disorder), recurrent severe, without psychosis ([place]) 11/29/2017  . 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 Adjustment disorder with mixed disturbance of emotions and conduct   . QT prolongation   . Suicide attempt ([place])   . Overdose of antidepressant, intentional self-harm, initial encounter ([place])   . Overdose  07/26/2017  . Suicide attempt by other psychotropic drug overdose ([place])   . DKA (diabetic ketoacidoses) 03/14/2017  . Chronic constipation   . Seizures ([place]) 12/11/2016  . Diabetes mellitus type 2 in obese ([place]) 12/11/2016  . Uncontrolled diabetes mellitus ([place]) 11/03/2016  . Schizoaffective disorder, bipolar type ([place]) 11/02/2016  . Cluster B personality disorder ([place]) 11/02/2016  . Suicidal ideation   . Hepatic steatosis 02/06/2014  . Splenomegaly 02/06/2014  . 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 HTN (hypertension) 04/15/2013  . Posttraumatic stress disorder 12/07/2011  . PSVT (paroxysmal supraventricular tachycardia) ([place]) 08/26/2011  . ADHD 09/23/2007  . EPIGASTRIC PAIN 09/23/2007  . ADHD 09/23/2007  . Obesity, unspecified 07/30/2007  . Schizoaffective disorder, depressive type ([place]) 07/30/2007  . SLEEP DISORDER 07/30/2007  . METRORRHAGIA 06/13/2006  . DISORDER, MENSTRUAL NEC 06/13/2006  . Disequilibrium 06/13/2006  . POLYCYSTIC OVARIAN DISEASE 04/25/2006  . AMENORRHEA, SECONDARY 04/20/2006  . ACNE, MILD 04/20/2006    Past Surgical History:  Procedure Laterality Date  . CHOLECYSTECTOMY    . COLONOSCOPY WITH PROPOFOL N/A 03/08/2017   Procedure: COLONOSCOPY WITH PROPOFOL;  Surgeon: Milus Banister, MD;  Location: [facility] ENDOSCOPY;  Service: Endoscopy;  Laterality: N/A;  . HERNIA REPAIR    . Ovarian Cyst Excision    . VARICOSE VEIN SURGERY       OB History    Gravida  0   Para      Term      Preterm  AB      Living        SAB      IAB      Ectopic      Multiple      Live Births              Family History  Problem Relation Age of Onset  . Coronary artery disease Maternal Grandmother   . Diabetes type II Maternal Grandmother   . Cancer Maternal Grandmother   . Hypertension Mother   . Hypertension Father     Social History   Tobacco Use  . Smoking status: Never Smoker  . Smokeless tobacco: Never Used  Vaping Use  . Vaping Use:  Never used  Substance Use Topics  . Alcohol use: No  . Drug use: No    Home Medications Prior to Admission medications   Medication Sig Start Date End Date Taking? Authorizing Provider  atorvastatin (LIPITOR) 40 MG tablet Take 1 tablet (40 mg total) by mouth at bedtime. For high cholesterol 12/06/17   Lindell Spar I, NP  bisacodyl (DULCOLAX) 5 MG EC tablet Take 2 tablets (10 mg total) by mouth daily as needed for moderate constipation. (May buy from over the counter): For constipation 12/06/17   Lindell Spar I, NP  busPIRone (BUSPAR) 30 MG tablet Take 45 mg by mouth 3 (three) times daily.    [provider]  chlorproMAZINE (THORAZINE) 25 MG tablet Take 25 mg by mouth 3 (three) times daily. 01/27/19   [provider]  Docusate Sodium (DSS) 100 MG CAPS Take 1 (one) Tablet two times daily, as needed for constipation 08/15/18   [provider]  gabapentin (NEURONTIN) 400 MG capsule Take 400 mg by mouth 3 (three) times daily.    [provider]  insulin aspart (NOVOLOG) 100 UNIT/ML injection Inject 30 Units into the skin 3 (three) times daily with meals. For diabetes management Patient taking differently: Inject 40 Units into the skin 3 (three) times daily with meals. For diabetes management 12/06/17   Lindell Spar I, NP  insulin detemir (LEVEMIR) 100 UNIT/ML injection Inject 0.7 mLs (70 Units total) into the skin 2 (two) times daily. For diabetes management Patient taking differently: Inject 95 Units into the skin 2 (two) times daily. For diabetes management 12/06/17   Lindell Spar I, NP  Insulin Syringe-Needle U-100 25G X 1" 1 ML MISC 1 Syringe by Does not apply route 4 (four) times daily -  before meals and at bedtime. For blood sugar checks 12/06/17   Lindell Spar I, NP  lamoTRIgine (LAMICTAL) 150 MG tablet Take 1 tablet (150 mg total) by mouth 2 (two) times daily. 12/26/18   Suzzanne Cloud, NP  levETIRAcetam (KEPPRA) 1000 MG tablet Take 1 tablet (1,000 mg total)  by mouth 2 (two) times daily. 08/29/18   Marcial Pacas, MD  lisinopril (ZESTRIL) 10 MG tablet Take 10 mg by mouth daily.    [provider]  magnesium oxide (MAG-OX) 400 (241.3 Mg) MG tablet Take 1 tablet (400 mg total) by mouth daily. For acid rflux 12/07/17   Lindell Spar I, NP  Melatonin 3 MG TABS Take 1 tablet (3 mg total) by mouth at bedtime. For sleep 12/06/17   Lindell Spar I, NP  metoprolol tartrate (LOPRESSOR) 25 MG tablet Take 1 tablet (25 mg total) by mouth 2 (two) times daily. For high blood pressure 12/06/17   Nwoko, Herbert Pun I, NP  norethindrone (MICRONOR) 0.35 MG tablet Take 1 tablet by mouth  daily.  10/29/18 02/08/19  [provider]  pantoprazole (PROTONIX) 40 MG tablet Take 40 mg by mouth daily.    [provider]  QUEtiapine (SEROQUEL XR) 200 MG 24 hr tablet Take 450 mg by mouth at bedtime.    [provider]  QUEtiapine (SEROQUEL) 100 MG tablet Take 1 tablet (100 mg total) by mouth daily. For mood control 12/06/17   Lindell Spar I, NP  sertraline (ZOLOFT) 100 MG tablet Take 2 tablets (200 mg total) by mouth daily. Fordepression 12/07/17   Lindell Spar I, NP  TOUJEO MAX SOLOSTAR 300 UNIT/ML SOPN  12/26/18   [provider]  traZODone (DESYREL) 50 MG tablet Take 50 mg by mouth at bedtime.    [provider]    Allergies    Fish-derived products, Geodon [ziprasidone hcl], Haldol [haloperidol lactate], Buprenorphine hcl, Compazine [prochlorperazine], Morphine and related, and Toradol [ketorolac tromethamine]  Review of Systems   Review of Systems  Constitutional: Positive for fatigue. Negative for chills and fever.  HENT: Negative for ear pain and sore throat.   Eyes: Negative for pain and visual disturbance.  Respiratory: Negative for cough and shortness of breath.   Cardiovascular: Negative for chest pain and palpitations.  Gastrointestinal: Positive for abdominal pain and nausea. Negative for vomiting.  Endocrine: Positive for  polydipsia and polyuria.  Genitourinary: Negative for dysuria and hematuria.  Musculoskeletal: Negative for arthralgias and back pain.  Skin: Negative for color change and rash.  Neurological: Positive for weakness. Negative for dizziness, seizures and syncope.  Psychiatric/Behavioral: Negative for confusion.  All other systems reviewed and are negative.   Physical Exam Updated Vital Signs BP (!) 153/102 (BP Location: Left Arm)   Pulse 100   Temp 97.9 F (36.6 C) (Oral)   Resp 16   Ht 5\' 4"  (1.626 m)   Wt 108.9 kg   SpO2 98%   BMI 41.20 kg/m   Physical Exam Vitals and nursing note reviewed.  Constitutional:      General: She is not in acute distress.    Appearance: She is well-developed.  HENT:     Head: Normocephalic and atraumatic.  Eyes:     Conjunctiva/sclera: Conjunctivae normal.  Cardiovascular:     Rate and Rhythm: Regular rhythm. Tachycardia present.     Heart sounds: No murmur heard.   Pulmonary:     Effort: Pulmonary effort is normal. No respiratory distress.     Breath sounds: Normal breath sounds.  Abdominal:     Palpations: Abdomen is soft.     Tenderness: There is no abdominal tenderness.  Musculoskeletal:     Cervical back: Neck supple.  Skin:    General: Skin is warm and dry.  Neurological:     General: No focal deficit present.     Mental Status: She is alert and oriented to person, place, and time.  Psychiatric:        Mood and Affect: Mood normal.     ED Results / Procedures / Treatments   Labs (all labs ordered are listed, but only abnormal results are displayed) Labs Reviewed  URINALYSIS, ROUTINE W REFLEX MICROSCOPIC - Abnormal; Notable for the following components:      Result Value   Color, Urine STRAW (*)    Glucose, UA >=500 (*)    Hgb urine dipstick MODERATE (*)    Leukocytes,Ua TRACE (*)    All other components within normal limits  COMPREHENSIVE METABOLIC PANEL - Abnormal; Notable for the following components:   Sodium  134  (*)    CO2 21 (*)    Glucose, Bld 447 (*)    Calcium 8.3 (*)    All other components within normal limits  BLOOD GAS, VENOUS - Abnormal; Notable for the following components:   pCO2, Ven 40.3 (*)    pO2, Ven 53.1 (*)    All other components within normal limits  CBC - Abnormal; Notable for the following components:   WBC 3.9 (*)    Hemoglobin 11.2 (*)    MCH 25.1 (*)    RDW 15.6 (*)    Platelets 115 (*)    All other components within normal limits  CBG MONITORING, ED - Abnormal; Notable for the following components:   Glucose-Capillary 447 (*)    All other components within normal limits  CBG MONITORING, ED - Abnormal; Notable for the following components:   Glucose-Capillary 311 (*)    All other components within normal limits  CBG MONITORING, ED - Abnormal; Notable for the following components:   Glucose-Capillary 251 (*)    All other components within normal limits  CBG MONITORING, ED - Abnormal; Notable for the following components:   Glucose-Capillary 198 (*)    All other components within normal limits  SARS CORONAVIRUS 2 (TAT 6-24 HRS)  BETA-HYDROXYBUTYRIC ACID  I-STAT BETA HCG BLOOD, ED ([facility], [facility], [facility] ONLY)    EKG EKG Interpretation  Date/Time:  Saturday May 15 2020 17:10:07 EDT Ventricular Rate:  101 PR Interval:  177 QRS Duration: 94 QT Interval:  359 QTC Calculation: 466 R Axis:   -32 Text Interpretation: Sinus tachycardia Inferior infarct, old Anterior infarct, old axis normal QTc normal Confirmed by Lorre Munroe ([id number]) on 05/15/2020 5:29:37 PM   Radiology No results found.  Procedures Procedures   Medications Ordered in ED Medications  sodium chloride 0.9 % bolus 2,000 mL (has no administration in time range)  ondansetron (ZOFRAN) injection 4 mg (has no administration in time range)    ED Course  I have reviewed the triage vital signs and the nursing notes.  Pertinent labs & imaging results that were available during my care of the patient were  reviewed by me and considered in my medical decision making (see chart for details).    MDM Rules/Calculators/A&P                          [name] presented with hyperglycemia, nausea, complains of confusion.  She was noted to be hyperglycemic, but there is no evidence of ketoacidosis.  She was given IV fluids and insulin until her blood sugar was reduced to 200.  I was able to reviewed her primary providers plan for her diabetes, and I actually pasted this into her discharge instructions so that she can be clear on the amount of medication she is to take.  She will be discharged home.  No concern for other infection or underlying contributor to this presentation.  Return precautions were discussed. Final Clinical Impression(s) / ED Diagnoses Final diagnoses:  Hyperglycemia    Rx / DC Orders ED Discharge Orders    None       Arnaldo Natal, MD 05/15/20 2139

## 2020-05-15 NOTE — ED Triage Notes (Signed)
Patient reports her blood sugar is high. Patient is weak and disoriented. Unable to name where she is or who brought her to ED. Denies pain.

## 2020-05-16 LAB — SARS CORONAVIRUS 2 (TAT 6-24 HRS): SARS Coronavirus 2: NEGATIVE

## 2020-05-21 DIAGNOSIS — Z85831 Personal history of malignant neoplasm of soft tissue: Secondary | ICD-10-CM | POA: Insufficient documentation

## 2020-05-31 ENCOUNTER — Telehealth: Payer: Self-pay | Admitting: Hematology and Oncology

## 2020-05-31 NOTE — Telephone Encounter (Signed)
ED Referral:   Patient referred by Ulla Gallo, DO for Low Platelets.  Appt made for 06/02/20 Consult at 9:00 am

## 2020-05-31 NOTE — Telephone Encounter (Signed)
OK, thanks

## 2020-06-02 ENCOUNTER — Encounter: Payer: Self-pay | Admitting: Hematology and Oncology

## 2020-06-02 ENCOUNTER — Other Ambulatory Visit: Payer: Self-pay

## 2020-06-02 ENCOUNTER — Inpatient Hospital Stay: Payer: Medicare HMO

## 2020-06-02 ENCOUNTER — Inpatient Hospital Stay: Payer: Medicare HMO | Attending: Hematology and Oncology | Admitting: Hematology and Oncology

## 2020-06-02 ENCOUNTER — Telehealth: Payer: Self-pay | Admitting: Hematology and Oncology

## 2020-06-02 DIAGNOSIS — D61818 Other pancytopenia: Secondary | ICD-10-CM

## 2020-06-02 DIAGNOSIS — G894 Chronic pain syndrome: Secondary | ICD-10-CM

## 2020-06-02 DIAGNOSIS — E538 Deficiency of other specified B group vitamins: Secondary | ICD-10-CM | POA: Insufficient documentation

## 2020-06-02 DIAGNOSIS — N939 Abnormal uterine and vaginal bleeding, unspecified: Secondary | ICD-10-CM | POA: Diagnosis not present

## 2020-06-02 DIAGNOSIS — K5909 Other constipation: Secondary | ICD-10-CM | POA: Diagnosis not present

## 2020-06-02 DIAGNOSIS — R5383 Other fatigue: Secondary | ICD-10-CM

## 2020-06-02 DIAGNOSIS — K760 Fatty (change of) liver, not elsewhere classified: Secondary | ICD-10-CM | POA: Diagnosis not present

## 2020-06-02 DIAGNOSIS — E66812 Obesity, class 2: Secondary | ICD-10-CM

## 2020-06-02 DIAGNOSIS — R161 Splenomegaly, not elsewhere classified: Secondary | ICD-10-CM

## 2020-06-02 DIAGNOSIS — E669 Obesity, unspecified: Secondary | ICD-10-CM

## 2020-06-02 LAB — CBC WITH DIFFERENTIAL/PLATELET
Abs Immature Granulocytes: 0.03 10*3/uL (ref 0.00–0.07)
Basophils Absolute: 0 10*3/uL (ref 0.0–0.1)
Basophils Relative: 0 %
Eosinophils Absolute: 0.3 10*3/uL (ref 0.0–0.5)
Eosinophils Relative: 6 %
HCT: 36.2 % (ref 36.0–46.0)
Hemoglobin: 11 g/dL — ABNORMAL LOW (ref 12.0–15.0)
Immature Granulocytes: 1 %
Lymphocytes Relative: 22 %
Lymphs Abs: 1 10*3/uL (ref 0.7–4.0)
MCH: 24.6 pg — ABNORMAL LOW (ref 26.0–34.0)
MCHC: 30.4 g/dL (ref 30.0–36.0)
MCV: 80.8 fL (ref 80.0–100.0)
Monocytes Absolute: 0.2 10*3/uL (ref 0.1–1.0)
Monocytes Relative: 5 %
Neutro Abs: 3 10*3/uL (ref 1.7–7.7)
Neutrophils Relative %: 66 %
Platelets: 102 10*3/uL — ABNORMAL LOW (ref 150–400)
RBC: 4.48 MIL/uL (ref 3.87–5.11)
RDW: 15.2 % (ref 11.5–15.5)
WBC: 4.6 10*3/uL (ref 4.0–10.5)
nRBC: 0 % (ref 0.0–0.2)

## 2020-06-02 LAB — COMPREHENSIVE METABOLIC PANEL
ALT: 26 U/L (ref 0–44)
AST: 46 U/L — ABNORMAL HIGH (ref 15–41)
Albumin: 3.5 g/dL (ref 3.5–5.0)
Alkaline Phosphatase: 82 U/L (ref 38–126)
Anion gap: 7 (ref 5–15)
BUN: 9 mg/dL (ref 6–20)
CO2: 25 mmol/L (ref 22–32)
Calcium: 8.7 mg/dL — ABNORMAL LOW (ref 8.9–10.3)
Chloride: 99 mmol/L (ref 98–111)
Creatinine, Ser: 0.41 mg/dL — ABNORMAL LOW (ref 0.44–1.00)
GFR, Estimated: >60 mL/min (ref >60)
Glucose, Bld: 307 mg/dL — ABNORMAL HIGH (ref 70–99)
Potassium: 4.2 mmol/L (ref 3.5–5.1)
Sodium: 131 mmol/L — ABNORMAL LOW (ref 135–145)
Total Bilirubin: 0.3 mg/dL (ref 0.3–1.2)
Total Protein: 6.4 g/dL — ABNORMAL LOW (ref 6.5–8.1)

## 2020-06-02 LAB — APTT: aPTT: 29 seconds (ref 24–36)

## 2020-06-02 LAB — IRON AND TIBC
Iron: 34 ug/dL (ref 28–170)
Saturation Ratios: 8 % — ABNORMAL LOW (ref 10.4–31.8)
TIBC: 426 ug/dL (ref 250–450)
UIBC: 392 ug/dL

## 2020-06-02 LAB — SEDIMENTATION RATE: Sed Rate: 24 mm/hr — ABNORMAL HIGH (ref 0–22)

## 2020-06-02 LAB — PROTIME-INR
INR: 1.1 (ref 0.8–1.2)
Prothrombin Time: 14 seconds (ref 11.4–15.2)

## 2020-06-02 LAB — T4, FREE: Free T4: 0.83 ng/dL (ref 0.61–1.12)

## 2020-06-02 LAB — FERRITIN: Ferritin: 11 ng/mL (ref 11–307)

## 2020-06-02 LAB — VITAMIN B12: Vitamin B-12: 261 pg/mL (ref 180–914)

## 2020-06-02 LAB — TSH: TSH: 3.22 u[IU]/mL (ref 0.350–4.500)

## 2020-06-02 NOTE — Assessment & Plan Note (Signed)
She has significant hepatic steatosis and liver enlargement on CT I do not believe this is the cause of her abdominal pain On further review of her CT imaging, she has signs that congestion of the blood vessels near her spleen I suspect she is developing portal hypertension I recommend GI follow-up and will refer her back to see Dr. Ardis Hughs

## 2020-06-02 NOTE — Assessment & Plan Note (Signed)
She has uncontrolled diabetes causing significant class II obesity Recommend consideration for referral to medical weight management center 

## 2020-06-02 NOTE — Assessment & Plan Note (Signed)
The cause of her pancytopenia is multifactorial I suspect the cause of leukopenia and thrombocytopenia due to sequestration from splenomegaly I will order some additional work-up

## 2020-06-02 NOTE — Assessment & Plan Note (Signed)
This is due to liver cirrhosis and portal hypertension from uncontrolled diabetes

## 2020-06-02 NOTE — Assessment & Plan Note (Signed)
This is the most likely cause of her abdominal pain We discussed the importance of adequate fluid intake and laxatives.  A few of her medications are likely the contributing factor for her chronic constipation

## 2020-06-02 NOTE — Assessment & Plan Note (Signed)
Could be due to vitamin D deficiency Will check level I informed the patient I do not prescribe chronic narcotic prescription for chronic pain management.  She need to discuss this with her primary care doctor

## 2020-06-02 NOTE — Assessment & Plan Note (Signed)
She has microcytic anemia and dysfunctional uterine bleeding She has ovarian cyst noted on CT imaging She had pelvic ultrasound done in the past but does not follow-up with a GYN physician on a regular basis I will discuss this with her primary care doctor for follow-up

## 2020-06-02 NOTE — Assessment & Plan Note (Signed)
Suspect this is due to her sedating medications I will order thyroid function tests

## 2020-06-02 NOTE — Telephone Encounter (Signed)
Per 5/11 Staff Message, patient scheduled for 5/18 Follow Up.  Gave patient Appt Summary

## 2020-06-02 NOTE — Progress Notes (Signed)
[facility] NOTE  Patient Care Team: Nolen Mu as PCP - General (Family Medicine) Jackelyn Knife, MD as Rounding Team (Internal Medicine)  CHIEF COMPLAINTS/PURPOSE OF CONSULTATION:  Chronic thrombocytopenia  HISTORY OF PRESENTING ILLNESS:  Tina Saunders 34 y.o. female is here because of thrombocytopenia.  She was found to have abnormal CBC from from recent blood draw I have the opportunity to review his CBC dated back to 06/13/2006 She started to have new onset of thrombocytopenia since September 19, 2016, when.  Platelet count 239,000 Since then, her platelet count ranged from 90,000-224,000 Most recently, on May 15, 2020, CBC showed white count of 3.9, hemoglobin 11.2 and platelet count 113,000  She has numerous imaging studies in the past On January 02, 2018, she is noted to have fatty liver disease with splenomegaly and portal hypertension The cause of her fatty liver changes due to uncontrolled diabetes She has another repeat CT imaging of the abdomen May 6 which I am able to review personally There are consistent changes with fatty liver disease and splenomegaly with signs of portal hypertension.  There is also incidental finding of cyst of the right adnexa  She has easy recent bruising but denies bleeding, such as spontaneous epistaxis, hematuria, melena or hematochezia She denies recent excessive menorrhagia; she has regular menstrual cycle for 7 days every month The patient denies history of liver disease, exposure to heparin, history of cardiac murmur/prior cardiovascular surgery or recent new medications She denies prior blood or platelet transfusions She complain of significant right upper quadrant pain without radiation.  She is profoundly constipated and has not have regular bowel movement for 2 days.  She has loss of appetite. She is noted to have chronic diabetes.  She has been diabetic for at least 7 years, poorly controlled with  recurrent ER visit because of this.  She has stable neuropathy peripheral neuropathy She complains of diffuse musculoskeletal pain  MEDICAL HISTORY:  Past Medical History:  Diagnosis Date  . Anxiety   . Asthma   . Bipolar 1 disorder ([place])   . Cancer of abdominal wall   . Depression   . Diabetes mellitus without complication ([place])   . High cholesterol   . Hypertension   . Intentional drug overdose ([place]) 07/26/2017   Seroquel and buspirone   . Obesity   . Obesity   . Polycystic ovarian syndrome 07/01/2011   Patient report  . 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 Schizophrenia ([place])   . Seizures ([place])     SURGICAL HISTORY: Past Surgical History:  Procedure Laterality Date  . CHOLECYSTECTOMY    . COLONOSCOPY WITH PROPOFOL N/A 03/08/2017   Procedure: COLONOSCOPY WITH PROPOFOL;  Surgeon: Milus Banister, MD;  Location: [facility];  Service: Endoscopy;  Laterality: N/A;  . HERNIA REPAIR    . Ovarian Cyst Excision    . VARICOSE VEIN SURGERY      SOCIAL HISTORY: Social History   Socioeconomic History  . Marital status: Single    Spouse name: Not on file  . Number of children: 0  . Years of education: Not on file  . Highest education level: Not on file  Occupational History  . Not on file  Tobacco Use  . Smoking status: Never Smoker  . Smokeless tobacco: Never Used  Vaping Use  . Vaping Use: Never used  Substance and Sexual Activity  . Alcohol use: No  . Drug use: No  . Sexual activity: Never    Birth  control/protection: None  Other Topics Concern  . Not on file  Social History Narrative  . Not on file   Social Determinants of Health   Financial Resource Strain: Not on file  Food Insecurity: Not on file  Transportation Needs: Not on file  Physical Activity: Not on file  Stress: Not on file  Social Connections: Not on file  Intimate Partner Violence: Not on file    FAMILY HISTORY: Family History  Problem Relation Age of Onset  . Coronary artery disease Maternal  Grandmother   . Diabetes type II Maternal Grandmother   . Cancer Maternal Grandmother   . Hypertension Mother   . Hypertension Father     ALLERGIES:  is allergic to fish-derived products, geodon [ziprasidone hcl], haldol [haloperidol lactate], buprenorphine hcl, compazine [prochlorperazine], morphine and related, toradol [ketorolac tromethamine], and ziprasidone.  MEDICATIONS:  Current Outpatient Medications  Medication Sig Dispense Refill  . baclofen (LIORESAL) 10 MG tablet TAKE TWO TABLETS BY MOUTH THREE TIMES A DAY. (WHITE ROUND TABLET WITH [id number])    . bisacodyl (DULCOLAX) 5 MG EC tablet Take 2 tablets (10 mg total) by mouth daily as needed for moderate constipation. (May buy from over the counter): For constipation 30 tablet 0  . busPIRone (BUSPAR) 15 MG tablet Take 15 mg by mouth 3 (three) times daily.    . chlorproMAZINE (THORAZINE) 25 MG tablet Take 25 mg by mouth 3 (three) times daily.    Mariane Baumgarten Sodium (DSS) 100 MG CAPS Take 100 mg by mouth as needed (constipation).    . gabapentin (NEURONTIN) 400 MG capsule Take 400 mg by mouth 3 (three) times daily.    . insulin glargine (LANTUS SOLOSTAR) 100 UNIT/ML Solostar Pen Inject into the skin.    Marland Kitchen lamoTRIgine (LAMICTAL) 100 MG tablet take 1 tablet by oral route in the morning and 1.5 tablet at night    . levETIRAcetam (KEPPRA) 1000 MG tablet Take 1 tablet (1,000 mg total) by mouth 2 (two) times daily. 180 tablet 4  . lisinopril (ZESTRIL) 10 MG tablet Take 10 mg by mouth daily.    . metoprolol tartrate (LOPRESSOR) 25 MG tablet Take 25 mg by mouth 2 (two) times daily.    . pantoprazole (PROTONIX) 40 MG tablet Take 40 mg by mouth daily.    . polyethylene glycol powder (GLYCOLAX/MIRALAX) 17 GM/SCOOP powder MIX 17 GRAMS (CAPFUL TO FILL LINE) INTO 8 OUNCES OF LIQUID AND DRINK ONCE A DAY    . QUEtiapine (SEROQUEL XR) 200 MG 24 hr tablet Take 450 mg by mouth at bedtime.    Marland Kitchen QUEtiapine (SEROQUEL) 50 MG tablet Take 50 mg by mouth 2 (two)  times daily.    . sertraline (ZOLOFT) 100 MG tablet Take 2 tablets (200 mg total) by mouth daily. Fordepression (Patient taking differently: Take 100 mg by mouth daily. Fordepression) 60 tablet 0  . traZODone (DESYREL) 100 MG tablet Take 150 mg by mouth at bedtime.    . Insulin Syringe-Needle U-100 25G X 1" 1 ML MISC 1 Syringe by Does not apply route 4 (four) times daily -  before meals and at bedtime. For blood sugar checks 1 each 0  . LEVEMIR FLEXTOUCH 100 UNIT/ML FlexPen Inject into the skin.    Marland Kitchen NOVOLOG FLEXPEN 100 UNIT/ML FlexPen SMARTSIG:40 Unit(s) SUB-Q Every Evening     No current facility-administered medications for this visit.    REVIEW OF SYSTEMS:   Constitutional: Denies fevers, chills or abnormal night sweats Eyes: Denies blurriness of vision, double vision  or watery eyes Ears, nose, mouth, throat, and face: Denies mucositis or sore throat Respiratory: Denies cough, dyspnea or wheezes Cardiovascular: Denies palpitation, chest discomfort or lower extremity swelling Skin: Denies abnormal skin rashes Lymphatics: Denies new lymphadenopathy  Neurological:Denies numbness, tingling or new weaknesses Behavioral/Psych: Mood is stable, no new changes  All other systems were reviewed with the patient and are negative.  PHYSICAL EXAMINATION: ECOG PERFORMANCE STATUS: 2 - Symptomatic, <50% confined to bed  Vitals:   06/02/20 0903  BP: (!) 148/95  Pulse: 91  Resp: 18  Temp: 97.9 F (36.6 C)  SpO2: 98%   Filed Weights   06/02/20 0903  Weight: 233 lb (105.7 kg)    GENERAL:alert, no distress and comfortable SKIN: skin color, texture, turgor are normal, no rashes or significant lesions EYES: normal, conjunctiva are pink and non-injected, sclera clear OROPHARYNX:no exudate, no erythema and lips, buccal mucosa, and tongue normal  NECK: supple, thyroid normal size, non-tender, without nodularity LYMPH:  no palpable lymphadenopathy in the cervical, axillary or inguinal LUNGS:  clear to auscultation and percussion with normal breathing effort HEART: regular rate & rhythm and no murmurs and no lower extremity edema ABDOMEN:abdomen soft, non-tender and normal bowel sounds Musculoskeletal:no cyanosis of digits and no clubbing  PSYCH: alert & oriented x 3 with fluent speech NEURO: no focal motor/sensory deficits  LABORATORY DATA:  I have reviewed the data as listed Last CBC Lab Results  Component Value Date   WBC 3.9 (L) 05/15/2020   HGB 11.2 (L) 05/15/2020   HCT 36.4 05/15/2020   MCV 81.6 05/15/2020   MCH 25.1 (L) 05/15/2020   RDW 15.6 (H) 05/15/2020   PLT 115 (L) 05/15/2020     RADIOGRAPHIC STUDIES: I have personally reviewed the radiological images as listed and agreed with the findings in the report from May 2022 ASSESSMENT & PLAN Pancytopenia, acquired ([place]) The cause of her pancytopenia is multifactorial I suspect the cause of leukopenia and thrombocytopenia due to sequestration from splenomegaly I will order some additional work-up  Abnormal uterine bleeding (AUB) She has microcytic anemia and dysfunctional uterine bleeding She has ovarian cyst noted on CT imaging She had pelvic ultrasound done in the past but does not follow-up with a GYN physician on a regular basis I will discuss this with her primary care doctor for follow-up  Hepatic steatosis She has significant hepatic steatosis and liver enlargement on CT I do not believe this is the cause of her abdominal pain On further review of her CT imaging, she has signs that congestion of the blood vessels near her spleen I suspect she is developing portal hypertension I recommend GI follow-up and will refer her back to see Dr. Ardis Hughs   Splenomegaly This is due to liver cirrhosis and portal hypertension from uncontrolled diabetes  Other fatigue Suspect this is due to her sedating medications I will order thyroid function tests  Obesity, Class II, BMI 35-39.9 She has uncontrolled diabetes  causing significant class II obesity Recommend consideration for referral to medical weight management center  Other constipation This is the most likely cause of her abdominal pain We discussed the importance of adequate fluid intake and laxatives.  A few of her medications are likely the contributing factor for her chronic constipation  Chronic pain syndrome Could be due to vitamin D deficiency Will check level I informed the patient I do not prescribe chronic narcotic prescription for chronic pain management.  She need to discuss this with her primary care doctor   Orders  Placed This Encounter  Procedures  . CBC with Differential/Platelet    Standing Status:   Future    Number of Occurrences:   1    Standing Expiration Date:   06/02/2021  . Protime-INR    Standing Status:   Future    Number of Occurrences:   1    Standing Expiration Date:   06/02/2021  . APTT    Standing Status:   Future    Number of Occurrences:   1    Standing Expiration Date:   06/02/2021  . Iron and TIBC    Standing Status:   Future    Number of Occurrences:   1    Standing Expiration Date:   06/02/2021  . Ferritin    Standing Status:   Future    Number of Occurrences:   1    Standing Expiration Date:   06/02/2021  . Vitamin B12    Standing Status:   Future    Number of Occurrences:   1    Standing Expiration Date:   06/02/2021  . Sedimentation rate    Standing Status:   Future    Number of Occurrences:   1    Standing Expiration Date:   06/02/2021  . TSH    Standing Status:   Future    Number of Occurrences:   1    Standing Expiration Date:   06/02/2021  . T4, free    Standing Status:   Future    Number of Occurrences:   1    Standing Expiration Date:   06/02/2021  . ANA, IFA (with reflex)  . Comprehensive metabolic panel  . VITAMIN D 25 Hydroxy (Vit-D Deficiency, Fractures)  . Ambulatory referral to Gastroenterology    Referral Priority:   Routine    Referral Type:   Consultation    Referral  Reason:   Specialty Services Required    Referred to Provider:   Milus Banister, MD    Number of Visits Requested:   1  . Amb Ref to Medical Weight Management    Referral Priority:   Routine    Referral Type:   Consultation    Number of Visits Requested:   1   All questions were answered. The patient knows to call the clinic with any problems, questions or concerns. No barriers to learning was detected. The total time spent in the appointment was 60 minutes encounter with patients including review of chart and various tests results, discussions about plan of care and coordination of care plan  Heath Lark, MD 06/02/2020 12:17 PM

## 2020-06-03 ENCOUNTER — Telehealth: Payer: Self-pay

## 2020-06-03 LAB — ANTINUCLEAR ANTIBODIES, IFA: ANA Ab, IFA: NEGATIVE

## 2020-06-03 LAB — VITAMIN D 25 HYDROXY (VIT D DEFICIENCY, FRACTURES): Vit D, 25-Hydroxy: 19.94 ng/mL — ABNORMAL LOW (ref 30–100)

## 2020-06-03 NOTE — Telephone Encounter (Signed)
RN spoke with Tina Saunders at [facility] on behalf of Tina Saunders, [place].   They will initiate referral to OB/GYN.

## 2020-06-03 NOTE — Telephone Encounter (Signed)
-----   Message from Heath Lark, MD sent at 06/03/2020  9:29 AM EDT ----- I saw this patient yesterday Can you call her PCP office if she can recommend GYN to see her? She has cyst on her ovary noted on CT; I'm not sure if she has preferred group of GYN she would refer her to see

## 2020-06-04 ENCOUNTER — Telehealth: Payer: Self-pay

## 2020-06-04 NOTE — Telephone Encounter (Addendum)
I spoke with pt and gave her Dr Alvy Bimler response below. Pt verbalized understanding. ----- Message from Heath Lark, MD sent at 06/04/2020  7:40 AM EDT ----- Regarding: RE: Lab results   [facility] Contact: [phone] Hi Antonietta [place],  The reason I am bring her back next week is to discuss results face to face I will not be able to call her and explain everything and still see her next week Please call her back and let her know ----- Message ----- From: Dairl Ponder, RN Sent: 06/03/2020   3:55 PM EDT To: Heath Lark, MD Subject: Lab results   [facility]       Pt has called requesting lab results. Thanks!

## 2020-06-09 ENCOUNTER — Inpatient Hospital Stay (INDEPENDENT_AMBULATORY_CARE_PROVIDER_SITE_OTHER): Payer: Medicare HMO | Admitting: Hematology and Oncology

## 2020-06-09 ENCOUNTER — Encounter: Payer: Self-pay | Admitting: Hematology and Oncology

## 2020-06-09 ENCOUNTER — Other Ambulatory Visit: Payer: Self-pay

## 2020-06-09 DIAGNOSIS — K760 Fatty (change of) liver, not elsewhere classified: Secondary | ICD-10-CM

## 2020-06-09 DIAGNOSIS — G894 Chronic pain syndrome: Secondary | ICD-10-CM | POA: Diagnosis not present

## 2020-06-09 DIAGNOSIS — D61818 Other pancytopenia: Secondary | ICD-10-CM

## 2020-06-09 DIAGNOSIS — E669 Obesity, unspecified: Secondary | ICD-10-CM

## 2020-06-09 DIAGNOSIS — N939 Abnormal uterine and vaginal bleeding, unspecified: Secondary | ICD-10-CM | POA: Diagnosis not present

## 2020-06-09 DIAGNOSIS — E66812 Obesity, class 2: Secondary | ICD-10-CM

## 2020-06-09 DIAGNOSIS — K5909 Other constipation: Secondary | ICD-10-CM

## 2020-06-09 NOTE — Assessment & Plan Note (Addendum)
She has diffuse chronic pain syndrome Screening test for autoimmune disorders were negative Her vitamin D level is very low and I would recommend high-dose vitamin D replacement therapy She states she has already reviewed this with her primary care doctor and was prescribed high-dose vitamin D supplement.  She has received a prescription today In general, that would somewhat help chronic pain syndrome

## 2020-06-09 NOTE — Progress Notes (Signed)
[place] OFFICE PROGRESS NOTE  Georganna Skeans, PA-C  ASSESSMENT & PLAN:  Pancytopenia, acquired [facility]) The cause of her pancytopenia is multifactorial She has borderline iron deficiency anemia likely secondary to dysfunctional uterine bleeding. She also has borderline vitamin B12 deficiency Recommend she start taking oral vitamin B12 and iron supplement However, due to her severe constipation, I recommend iron rich diet only for now.  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 Unless the patient is able to lose weight, this is not a fixable problem   Abnormal uterine bleeding (AUB) She has microcytic anemia and dysfunctional uterine bleeding She has ovarian cyst noted on CT imaging She had pelvic ultrasound done in the past but does not follow-up with a GYN physician on a regular basis As above, I reach out to her primary care doctor to refer her back to see a gynecologist for evaluation I do not believe this is contributed by her mild thrombocytopenia In general, unless her platelet count is less than 50,000, it is not likely to contribute to excessive bleed  Chronic pain syndrome She has diffuse chronic pain syndrome Screening test for autoimmune disorders were negative Her vitamin D level is very low and I would recommend high-dose vitamin D replacement therapy She states she has already reviewed this with her primary care doctor and was prescribed high-dose vitamin D supplement.  She has received a prescription today In general, that would somewhat help chronic pain syndrome  Hepatic steatosis She has abnormal liver enzymes Her coagulation studies are  adequate This is due to fatty liver disease She has uncontrolled diabetes  Obesity, Class II, BMI 35-39.9 She has uncontrolled diabetes causing significant class II obesity Recommend consideration for referral to medical weight management center  Other constipation This is the most likely cause of her abdominal pain We discussed the importance of adequate fluid intake and laxatives.  A few of her medications are likely the contributing factor for her chronic constipation   No orders of the defined types were placed in this encounter.   The total time spent in the appointment was 20 minutes encounter with patients including review of chart and various tests results, discussions about plan of care and coordination of care plan   All questions were answered. The patient knows to call the clinic with any problems, questions or concerns. No barriers to learning was detected.    Heath Lark, MD 5/18/20229:14 AM  INTERVAL HISTORY: ADAHLIA PATILLO 35 y.o. female returns for further follow-up and review of test results She has not been contacted about the weight management center. Her primary care doctor has sent her to see her gynecologist about abnormal CT imaging Her primary care doctor has also prescribed her with high-dose vitamin D replacement therapy  SUMMARY OF HEMATOLOGIC HISTORY: DEMOND SAHM 35 y.o. female is here because of thrombocytopenia.  She was found to have abnormal CBC from from recent blood draw I have the opportunity to review his CBC dated back to 06/13/2006 She started to have new onset of thrombocytopenia since September 19, 2016, when.  Platelet count 239,000 Since then, her platelet count ranged from 90,000-224,000 Most recently, on May 15, 2020, CBC showed  white count of 3.9, hemoglobin 11.2 and platelet count 113,000  She has numerous imaging studies in the past On January 02, 2018, she is noted to have fatty liver disease with splenomegaly and portal  hypertension The cause of her fatty liver changes due to uncontrolled diabetes She has another repeat CT imaging of the abdomen May 6th, 2022 which I am able to review personally There are consistent changes with fatty liver disease and splenomegaly with signs of portal hypertension.  There is also incidental finding of cyst of the right adnexa  She has easy recent bruising but denies bleeding, such as spontaneous epistaxis, hematuria, melena or hematochezia She denies recent excessive menorrhagia; she has regular menstrual cycle for 7 days every month The patient denies history of liver disease, exposure to heparin, history of cardiac murmur/prior cardiovascular surgery or recent new medications She denies prior blood or platelet transfusions She complain of significant right upper quadrant pain without radiation.  She is profoundly constipated and has not have regular bowel movement for 2 days.  She has loss of appetite. She is noted to have chronic diabetes.  She has been diabetic for at least 7 years, poorly controlled with recurrent ER visit because of this.  She has stable neuropathy peripheral neuropathy She complains of diffuse musculoskeletal pain 06/02/2020, she underwent extensive evaluation with multiple different blood tests.  I have reviewed the past medical history, past surgical history, social history and family history with the patient and they are unchanged from previous note.  ALLERGIES:  is allergic to fish-derived products, geodon [ziprasidone hcl], haldol [haloperidol lactate], buprenorphine hcl, compazine [prochlorperazine], morphine and related, toradol [ketorolac tromethamine], and ziprasidone.  MEDICATIONS:  Current Outpatient Medications  Medication Sig Dispense Refill  . ergocalciferol (VITAMIN D2) 1.25 MG (50000 UT) capsule Take 1 capsule by mouth once a week.    . insulin aspart (NOVOLOG FLEXPEN) 100 UNIT/ML FlexPen Inject into the skin.    Marland Kitchen insulin glargine  (LANTUS SOLOSTAR) 100 UNIT/ML Solostar Pen Inject into the skin.    . baclofen (LIORESAL) 10 MG tablet TAKE TWO TABLETS BY MOUTH THREE TIMES A DAY. (WHITE ROUND TABLET WITH [id number])    . bisacodyl (DULCOLAX) 5 MG EC tablet Take 2 tablets (10 mg total) by mouth daily as needed for moderate constipation. (May buy from over the counter): For constipation 30 tablet 0  . busPIRone (BUSPAR) 15 MG tablet Take 15 mg by mouth 3 (three) times daily.    . chlorproMAZINE (THORAZINE) 25 MG tablet Take 25 mg by mouth 3 (three) times daily.    Mariane Baumgarten Sodium (DSS) 100 MG CAPS Take 100 mg by mouth as needed (constipation).    . famotidine (PEPCID) 40 MG tablet Take by mouth.    . gabapentin (NEURONTIN) 600 MG tablet Take 600 mg by mouth 3 (three) times daily.    . Insulin Syringe-Needle U-100 25G X 1" 1 ML MISC 1 Syringe by Does not apply route 4 (four) times daily -  before meals and at bedtime. For blood sugar checks 1 each 0  . lamoTRIgine (LAMICTAL) 100 MG tablet take 1 tablet by oral route in the morning and 1.5 tablet at night    . LEVEMIR FLEXTOUCH 100 UNIT/ML FlexPen Inject into the skin.    Marland Kitchen levETIRAcetam (KEPPRA) 1000 MG tablet Take 1 tablet (1,000 mg total) by mouth 2 (two) times daily. 180 tablet 4  . lisinopril (ZESTRIL) 10 MG tablet Take 10 mg by mouth daily.    . metoprolol  tartrate (LOPRESSOR) 25 MG tablet Take 25 mg by mouth 2 (two) times daily.    . ondansetron (ZOFRAN) 4 MG tablet Take by mouth.    . pantoprazole (PROTONIX) 40 MG tablet Take 40 mg by mouth daily.    . polyethylene glycol powder (GLYCOLAX/MIRALAX) 17 GM/SCOOP powder MIX 17 GRAMS (CAPFUL TO FILL LINE) INTO 8 OUNCES OF LIQUID AND DRINK ONCE A DAY    . QUEtiapine (SEROQUEL XR) 200 MG 24 hr tablet Take 450 mg by mouth at bedtime.    Marland Kitchen QUEtiapine (SEROQUEL) 50 MG tablet Take 50 mg by mouth 2 (two) times daily.    . sertraline (ZOLOFT) 100 MG tablet Take 2 tablets (200 mg total) by mouth daily. Fordepression (Patient taking  differently: Take 100 mg by mouth daily. Fordepression) 60 tablet 0  . traZODone (DESYREL) 100 MG tablet Take 150 mg by mouth at bedtime.     No current facility-administered medications for this visit.     REVIEW OF SYSTEMS:   Constitutional: Denies fevers, chills or night sweats Eyes: Denies blurriness of vision Ears, nose, mouth, throat, and face: Denies mucositis or sore throat Respiratory: Denies cough, dyspnea or wheezes Cardiovascular: Denies palpitation, chest discomfort or lower extremity swelling Gastrointestinal:  Denies nausea, heartburn or change in bowel habits Skin: Denies abnormal skin rashes Lymphatics: Denies new lymphadenopathy or easy bruising Neurological:Denies numbness, tingling or new weaknesses Behavioral/Psych: Mood is stable, no new changes  All other systems were reviewed with the patient and are negative.  PHYSICAL EXAMINATION: ECOG PERFORMANCE STATUS: 1 - Symptomatic but completely ambulatory  Vitals:   06/09/20 0900  BP: 140/86  Pulse: (!) 108  Resp: 18  Temp: 97.6 F (36.4 C)  SpO2: 97%   Filed Weights   06/09/20 0900  Weight: 237 lb 4 oz (107.6 kg)    GENERAL:alert, no distress and comfortable NEURO: alert & oriented x 3 with fluent speech, no focal motor/sensory deficits  LABORATORY DATA:  I have reviewed the data as listed     Component Value Date/Time   NA 131 (L) 06/02/2020 1345   NA 139 07/13/2011 0025   K 4.2 06/02/2020 1345   K 4.3 07/13/2011 0025   CL 99 06/02/2020 1345   CL 106 07/13/2011 0025   CO2 25 06/02/2020 1345   CO2 25 07/13/2011 0025   GLUCOSE 307 (H) 06/02/2020 1345   GLUCOSE 105 (H) 07/13/2011 0025   BUN 9 06/02/2020 1345   BUN 9 07/13/2011 0025   CREATININE 0.41 (L) 06/02/2020 1345   CREATININE 0.68 07/13/2011 0025   CALCIUM 8.7 (L) 06/02/2020 1345   CALCIUM 8.8 07/13/2011 0025   PROT 6.4 (L) 06/02/2020 1345   PROT 7.9 07/13/2011 0025   ALBUMIN 3.5 06/02/2020 1345   ALBUMIN 3.5 07/13/2011 0025   AST  46 (H) 06/02/2020 1345   AST 57 (H) 07/13/2011 0025   ALT 26 06/02/2020 1345   ALT 38 07/13/2011 0025   ALKPHOS 82 06/02/2020 1345   ALKPHOS 101 07/13/2011 0025   BILITOT 0.3 06/02/2020 1345   BILITOT 0.2 07/13/2011 0025   GFRNONAA >60 06/02/2020 1345   GFRNONAA >60 07/13/2011 0025   GFRAA >60 08/15/2019 1752   GFRAA >60 07/13/2011 0025    No results found for: SPEP, UPEP  Lab Results  Component Value Date   WBC 4.6 06/02/2020   NEUTROABS 3.0 06/02/2020   HGB 11.0 (L) 06/02/2020   HCT 36.2 06/02/2020   MCV 80.8 06/02/2020   PLT 102 (L) 06/02/2020  Chemistry      Component Value Date/Time   NA 131 (L) 06/02/2020 1345   NA 139 07/13/2011 0025   K 4.2 06/02/2020 1345   K 4.3 07/13/2011 0025   CL 99 06/02/2020 1345   CL 106 07/13/2011 0025   CO2 25 06/02/2020 1345   CO2 25 07/13/2011 0025   BUN 9 06/02/2020 1345   BUN 9 07/13/2011 0025   CREATININE 0.41 (L) 06/02/2020 1345   CREATININE 0.68 07/13/2011 0025      Component Value Date/Time   CALCIUM 8.7 (L) 06/02/2020 1345   CALCIUM 8.8 07/13/2011 0025   ALKPHOS 82 06/02/2020 1345   ALKPHOS 101 07/13/2011 0025   AST 46 (H) 06/02/2020 1345   AST 57 (H) 07/13/2011 0025   ALT 26 06/02/2020 1345   ALT 38 07/13/2011 0025   BILITOT 0.3 06/02/2020 1345   BILITOT 0.2 07/13/2011 0025

## 2020-06-09 NOTE — Assessment & Plan Note (Signed)
She has abnormal liver enzymes Her coagulation studies are adequate This is due to fatty liver disease She has uncontrolled diabetes

## 2020-06-09 NOTE — Assessment & Plan Note (Signed)
This is the most likely cause of her abdominal pain We discussed the importance of adequate fluid intake and laxatives.  A few of her medications are likely the contributing factor for her chronic constipation 

## 2020-06-09 NOTE — Assessment & Plan Note (Addendum)
She has microcytic anemia and dysfunctional uterine bleeding She has ovarian cyst noted on CT imaging She had pelvic ultrasound done in the past but does not follow-up with a GYN physician on a regular basis As above, I reach out to her primary care doctor to refer her back to see a gynecologist for evaluation I do not believe this is contributed by her mild thrombocytopenia In general, unless her platelet count is less than 50,000, it is not likely to contribute to excessive bleed

## 2020-06-09 NOTE — Assessment & Plan Note (Addendum)
The cause of her pancytopenia is multifactorial She has borderline iron deficiency anemia likely secondary to dysfunctional uterine bleeding. She also has borderline vitamin B12 deficiency Recommend she start taking oral vitamin B12 and iron supplement However, due to her severe constipation, I recommend iron rich diet only for now.  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

## 2020-06-09 NOTE — Assessment & Plan Note (Signed)
She has uncontrolled diabetes causing significant class II obesity Recommend consideration for referral to medical weight management center

## 2020-07-07 ENCOUNTER — Emergency Department
Admission: EM | Admit: 2020-07-07 | Discharge: 2020-07-07 | Disposition: A | Discharge status: Home / Self Care | Payer: Medicare HMO | Attending: Emergency Medicine | Admitting: Emergency Medicine

## 2020-07-07 ENCOUNTER — Other Ambulatory Visit: Payer: Self-pay

## 2020-07-07 DIAGNOSIS — R1011 Right upper quadrant pain: Secondary | ICD-10-CM | POA: Insufficient documentation

## 2020-07-07 DIAGNOSIS — R5383 Other fatigue: Secondary | ICD-10-CM | POA: Insufficient documentation

## 2020-07-07 DIAGNOSIS — Z5321 Procedure and treatment not carried out due to patient leaving prior to being seen by health care provider: Secondary | ICD-10-CM | POA: Insufficient documentation

## 2020-07-07 DIAGNOSIS — R112 Nausea with vomiting, unspecified: Secondary | ICD-10-CM | POA: Insufficient documentation

## 2020-07-07 LAB — CBC
HCT: 34.4 % — ABNORMAL LOW (ref 36.0–46.0)
Hemoglobin: 10.7 g/dL — ABNORMAL LOW (ref 12.0–15.0)
MCH: 25.1 pg — ABNORMAL LOW (ref 26.0–34.0)
MCHC: 31.1 g/dL (ref 30.0–36.0)
MCV: 80.8 fL (ref 80.0–100.0)
Platelets: 106 10*3/uL — ABNORMAL LOW (ref 150–400)
RBC: 4.26 MIL/uL (ref 3.87–5.11)
RDW: 16.8 % — ABNORMAL HIGH (ref 11.5–15.5)
WBC: 4.1 10*3/uL (ref 4.0–10.5)
nRBC: 0 % (ref 0.0–0.2)

## 2020-07-07 LAB — COMPREHENSIVE METABOLIC PANEL
ALT: 26 U/L (ref 0–44)
AST: 36 U/L (ref 15–41)
Albumin: 3.7 g/dL (ref 3.5–5.0)
Alkaline Phosphatase: 87 U/L (ref 38–126)
Anion gap: 7 (ref 5–15)
BUN: 6 mg/dL (ref 6–20)
CO2: 27 mmol/L (ref 22–32)
Calcium: 8.6 mg/dL — ABNORMAL LOW (ref 8.9–10.3)
Chloride: 100 mmol/L (ref 98–111)
Creatinine, Ser: 0.43 mg/dL — ABNORMAL LOW (ref 0.44–1.00)
GFR, Estimated: >60 mL/min (ref >60)
Glucose, Bld: 305 mg/dL — ABNORMAL HIGH (ref 70–99)
Potassium: 4 mmol/L (ref 3.5–5.1)
Sodium: 134 mmol/L — ABNORMAL LOW (ref 135–145)
Total Bilirubin: 0.5 mg/dL (ref 0.3–1.2)
Total Protein: 6.7 g/dL (ref 6.5–8.1)

## 2020-07-07 LAB — TROPONIN I (HIGH SENSITIVITY): Troponin I (High Sensitivity): 3 ng/L (ref <18)

## 2020-07-07 LAB — LIPASE, BLOOD: Lipase: 33 U/L (ref 11–51)

## 2020-07-07 NOTE — ED Notes (Signed)
Discussed with MD Quentin Cornwall, repeat troponin not needed.

## 2020-07-07 NOTE — ED Triage Notes (Signed)
Pt states she began to have abdominal pain yesterday morning with n/v. Pt states she took zofran with no relief. Pt feels fatigued.

## 2020-07-07 NOTE — ED Triage Notes (Signed)
EMS brings pt in from home for rt U quad pain accomp by N/V x day

## 2020-07-14 ENCOUNTER — Emergency Department (HOSPITAL_COMMUNITY)
Admission: EM | Admit: 2020-07-14 | Discharge: 2020-07-15 | Disposition: A | Discharge status: Home / Self Care | Payer: Medicare HMO | Attending: Emergency Medicine | Admitting: Emergency Medicine

## 2020-07-14 ENCOUNTER — Encounter (HOSPITAL_COMMUNITY): Payer: Self-pay | Admitting: Emergency Medicine

## 2020-07-14 ENCOUNTER — Other Ambulatory Visit: Payer: Self-pay

## 2020-07-14 DIAGNOSIS — I10 Essential (primary) hypertension: Secondary | ICD-10-CM | POA: Diagnosis not present

## 2020-07-14 DIAGNOSIS — Z794 Long term (current) use of insulin: Secondary | ICD-10-CM | POA: Diagnosis not present

## 2020-07-14 DIAGNOSIS — R739 Hyperglycemia, unspecified: Secondary | ICD-10-CM

## 2020-07-14 DIAGNOSIS — E1165 Type 2 diabetes mellitus with hyperglycemia: Secondary | ICD-10-CM | POA: Insufficient documentation

## 2020-07-14 DIAGNOSIS — J45909 Unspecified asthma, uncomplicated: Secondary | ICD-10-CM | POA: Insufficient documentation

## 2020-07-14 DIAGNOSIS — Z79899 Other long term (current) drug therapy: Secondary | ICD-10-CM | POA: Diagnosis not present

## 2020-07-14 DIAGNOSIS — R109 Unspecified abdominal pain: Secondary | ICD-10-CM | POA: Diagnosis present

## 2020-07-14 DIAGNOSIS — R1011 Right upper quadrant pain: Secondary | ICD-10-CM

## 2020-07-14 LAB — CBC WITH DIFFERENTIAL/PLATELET
Abs Immature Granulocytes: 0.01 10*3/uL (ref 0.00–0.07)
Basophils Absolute: 0 10*3/uL (ref 0.0–0.1)
Basophils Relative: 0 %
Eosinophils Absolute: 0.1 10*3/uL (ref 0.0–0.5)
Eosinophils Relative: 3 %
HCT: 35.9 % — ABNORMAL LOW (ref 36.0–46.0)
Hemoglobin: 11.1 g/dL — ABNORMAL LOW (ref 12.0–15.0)
Immature Granulocytes: 0 %
Lymphocytes Relative: 28 %
Lymphs Abs: 1.1 10*3/uL (ref 0.7–4.0)
MCH: 24.6 pg — ABNORMAL LOW (ref 26.0–34.0)
MCHC: 30.9 g/dL (ref 30.0–36.0)
MCV: 79.4 fL — ABNORMAL LOW (ref 80.0–100.0)
Monocytes Absolute: 0.2 10*3/uL (ref 0.1–1.0)
Monocytes Relative: 6 %
Neutro Abs: 2.5 10*3/uL (ref 1.7–7.7)
Neutrophils Relative %: 63 %
Platelets: 113 10*3/uL — ABNORMAL LOW (ref 150–400)
RBC: 4.52 MIL/uL (ref 3.87–5.11)
RDW: 15.9 % — ABNORMAL HIGH (ref 11.5–15.5)
WBC: 4 10*3/uL (ref 4.0–10.5)
nRBC: 0 % (ref 0.0–0.2)

## 2020-07-14 LAB — COMPREHENSIVE METABOLIC PANEL
ALT: 26 U/L (ref 0–44)
AST: 33 U/L (ref 15–41)
Albumin: 4.1 g/dL (ref 3.5–5.0)
Alkaline Phosphatase: 110 U/L (ref 38–126)
Anion gap: 12 (ref 5–15)
BUN: 7 mg/dL (ref 6–20)
CO2: 22 mmol/L (ref 22–32)
Calcium: 9.3 mg/dL (ref 8.9–10.3)
Chloride: 97 mmol/L — ABNORMAL LOW (ref 98–111)
Creatinine, Ser: 0.43 mg/dL — ABNORMAL LOW (ref 0.44–1.00)
GFR, Estimated: >60 mL/min (ref >60)
Glucose, Bld: 411 mg/dL — ABNORMAL HIGH (ref 70–99)
Potassium: 4.1 mmol/L (ref 3.5–5.1)
Sodium: 131 mmol/L — ABNORMAL LOW (ref 135–145)
Total Bilirubin: 0.5 mg/dL (ref 0.3–1.2)
Total Protein: 7.7 g/dL (ref 6.5–8.1)

## 2020-07-14 LAB — I-STAT BETA HCG BLOOD, ED (MC, WL, AP ONLY): I-stat hCG, quantitative: <5 m[IU]/mL (ref <5)

## 2020-07-14 LAB — LIPASE, BLOOD: Lipase: 32 U/L (ref 11–51)

## 2020-07-14 LAB — CBG MONITORING, ED: Glucose-Capillary: 432 mg/dL — ABNORMAL HIGH (ref 70–99)

## 2020-07-14 NOTE — ED Triage Notes (Signed)
Pt arriving POV with N/V and abdominal pain that began this morning. Pt voicing concerns of her blood sugar being high as well but did not check it at home.

## 2020-07-14 NOTE — ED Provider Notes (Signed)
Emergency Medicine Provider Triage Evaluation Note  Tina Saunders 35 y.o. female was evaluated in triage.  Pt complains of right upper quadrant abdominal pain that began today.  She reports associated nausea/vomiting.  She does report some emesis looked like some dark coffee grounds.  She states that she has not had any dysuria, hematuria.  She does not know when her last menstrual cycle is.  She has a history of diabetes.  She states she has been compliant with her medications.  She did not check her blood sugar at home.  No chest pain, difficulty breathing.  No fevers.   Review of Systems  Positive: Abdominal pain, nausea/vomiting Negative: Dysuria, hematuria, fevers, chest pain, difficulty breathing.  Physical Exam  BP 134/82   Pulse 70   Temp 98.2 F (36.8 C) (Oral)   Resp 18   Ht 5\' 4"  (1.626 m)   Wt 65.8 kg   SpO2 100%   BMI 24.89 kg/m  Gen:   Awake, no distress   HEENT:  Atraumatic  Resp:  Normal effort  Cardiac:  Normal rate  Abd:   Nondistended, tender palpation noted right upper quadrant.  No rigidity, guarding. MSK:   Moves extremities without difficulty  Neuro:  Speech clear   Other:      Medical Decision Making  Medically screening exam initiated at 9:54 PM  Appropriate orders placed.  [name] was informed that the remainder of the evaluation will be completed by another provider, this initial triage assessment does not replace that evaluation. They are counseled that they will need to remain in the ED until the completion of their workup, including full H&P and results of any tests.  Risks of leaving the emergency department prior to completion of treatment were discussed. Patient was advised to inform ED staff if they are leaving before their treatment is complete. The patient acknowledged these risks and time was allowed for questions.     The patient appears stable so that the remainder of the MSE may be completed by another provider.    Clinical  Impression  Abdominal pain, nausea/vomiting.   Portions of this note were generated with [employer]obby[employer]. Dictation errors may occur despite best attempts at proofreading.     Volanda Napoleon, PA-C 07/14/20 2155    Truddie Hidden, MD 07/14/20 2228

## 2020-07-15 ENCOUNTER — Emergency Department (HOSPITAL_COMMUNITY): Payer: Medicare HMO

## 2020-07-15 ENCOUNTER — Encounter (HOSPITAL_COMMUNITY): Payer: Self-pay

## 2020-07-15 DIAGNOSIS — E1165 Type 2 diabetes mellitus with hyperglycemia: Secondary | ICD-10-CM | POA: Diagnosis not present

## 2020-07-15 LAB — URINALYSIS, ROUTINE W REFLEX MICROSCOPIC
Bilirubin Urine: NEGATIVE
Glucose, UA: >=500 mg/dL — AB
Ketones, ur: NEGATIVE mg/dL
Nitrite: NEGATIVE
Protein, ur: NEGATIVE mg/dL
Specific Gravity, Urine: 1.029 (ref 1.005–1.030)
pH: 5 (ref 5.0–8.0)

## 2020-07-15 LAB — CBG MONITORING, ED: Glucose-Capillary: 426 mg/dL — ABNORMAL HIGH (ref 70–99)

## 2020-07-15 MED ORDER — SODIUM CHLORIDE 0.9 % IV BOLUS
1000.0000 mL | Freq: Once | INTRAVENOUS | Status: AC
  Administered 2020-07-15: 1000 mL via INTRAVENOUS

## 2020-07-15 MED ORDER — SODIUM CHLORIDE (PF) 0.9 % IJ SOLN
INTRAMUSCULAR | Status: AC
  Filled 2020-07-15: qty 50

## 2020-07-15 MED ORDER — FENTANYL CITRATE (PF) 100 MCG/2ML IJ SOLN
100.0000 ug | Freq: Once | INTRAMUSCULAR | Status: AC
  Administered 2020-07-15: 100 ug via INTRAVENOUS
  Filled 2020-07-15: qty 2

## 2020-07-15 MED ORDER — IOHEXOL 300 MG/ML  SOLN
100.0000 mL | Freq: Once | INTRAMUSCULAR | Status: AC | PRN
  Administered 2020-07-15: 100 mL via INTRAVENOUS

## 2020-07-15 MED ORDER — PROMETHAZINE HCL 25 MG PO TABS: 25.0000 mg | ORAL_TABLET | Freq: Four times a day (QID) | ORAL | 0 refills | Status: DC | PRN

## 2020-07-15 MED ORDER — SODIUM CHLORIDE 0.9 % IV SOLN
25.0000 mg | Freq: Four times a day (QID) | INTRAVENOUS | Status: DC | PRN
  Administered 2020-07-15: 25 mg via INTRAVENOUS
  Filled 2020-07-15 (×2): qty 1

## 2020-07-15 MED ORDER — INSULIN ASPART 100 UNIT/ML IJ SOLN
10.0000 [IU] | Freq: Once | INTRAMUSCULAR | Status: AC
  Administered 2020-07-15: 10 [IU] via SUBCUTANEOUS
  Filled 2020-07-15: qty 0.1

## 2020-07-15 MED ORDER — DIPHENHYDRAMINE HCL 50 MG/ML IJ SOLN
25.0000 mg | Freq: Once | INTRAMUSCULAR | Status: AC
  Administered 2020-07-15: 25 mg via INTRAVENOUS
  Filled 2020-07-15: qty 1

## 2020-07-15 NOTE — ED Provider Notes (Signed)
[facility] DEPT Provider Note: Georgena Spurling, MD, FACEP  CSN: [id number] MRN: [id number] ARRIVAL: 07/14/20 at 2113 ROOM: [place]  Abdominal Pain   HISTORY OF PRESENT ILLNESS  07/15/20 2:33 AM Tina Saunders ROXAN YAMAMOTO is a 35 y.o. female with mid right-sided abdominal pain that began yesterday morning and has gradually worsened.  She now rates it as an 8 out of 10, sharp in nature.  It is worse with movement or palpation.  She has had associated nausea and vomiting and decreased appetite.  She has not had a fever.  She thinks her sugar may be elevated but has not checked it herself.  She is status postcholecystectomy.   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[place])    Schizophrenia ([place])    Seizures ([place])     Past Surgical History:  Procedure Laterality Date   CHOLECYSTECTOMY     COLONOSCOPY WITH PROPOFOL N/A 03/08/2017   Procedure: COLONOSCOPY WITH PROPOFOL;  Surgeon: Milus Banister, MD;  Location: [facility];  Service: Endoscopy;  Laterality: N/A;   HERNIA REPAIR     Ovarian Cyst Excision     VARICOSE VEIN SURGERY      Family History  Problem Relation Age of Onset   Coronary artery disease Maternal Grandmother    Diabetes type II Maternal Grandmother    Cancer Maternal Grandmother    Hypertension Mother    Hypertension Father     Social History   Tobacco Use   Smoking status: Never   Smokeless tobacco: Never  Vaping Use   Vaping Use: Never used  Substance Use Topics   Alcohol use: No   Drug use: No    Prior to Admission medications   Medication Sig Start Date End Date Taking? Authorizing Provider   promethazine (PHENERGAN) 25 MG tablet Take 1 tablet (25 mg total) by mouth every 6 (six) hours as needed for nausea or vomiting. 07/15/20  Yes Raif Chachere, MD  baclofen (LIORESAL) 10 MG tablet TAKE TWO TABLETS BY MOUTH THREE TIMES A DAY. (WHITE ROUND TABLET WITH [id number]) 05/27/20   [provider]  bisacodyl (DULCOLAX) 5 MG EC tablet Take 2 tablets (10 mg total) by mouth daily as needed for moderate constipation. (May buy from over the counter): For constipation 12/06/17   Lindell Spar I, NP  busPIRone (BUSPAR) 15 MG tablet Take 15 mg by mouth 3 (three) times daily. 05/23/20   [provider]  chlorproMAZINE (THORAZINE) 25 MG tablet Take 25 mg by mouth 3 (three) times daily. 01/27/19   [provider]  Docusate Sodium (DSS) 100 MG CAPS Take 100 mg by mouth as needed (constipation). 08/15/18   [provider]  ergocalciferol (VITAMIN D2) 1.25 MG (50000 UT) capsule Take 1 capsule by mouth once a week. 06/07/20   [provider]  famotidine (PEPCID) 40 MG tablet Take by mouth.    [provider]  gabapentin (NEURONTIN) 600 MG tablet Take 600 mg by mouth 3 (three) times daily. 06/07/20   [provider]  insulin aspart (  NOVOLOG FLEXPEN) 100 UNIT/ML FlexPen Inject into the skin. 06/07/20   [provider]  insulin glargine (LANTUS SOLOSTAR) 100 UNIT/ML Solostar Pen Inject into the skin. 06/07/20   [provider]  Insulin Syringe-Needle U-100 25G X 1" 1 ML MISC 1 Syringe by Does not apply route 4 (four) times daily -  before meals and at bedtime. For blood sugar checks 12/06/17   Lindell Spar I, NP  lamoTRIgine (LAMICTAL) 100 MG tablet take 1 tablet by oral route in the morning and 1.5 tablet at night 04/12/20   [provider]  LEVEMIR FLEXTOUCH 100 UNIT/ML FlexPen Inject into the skin. 04/23/20   [provider]  levETIRAcetam (KEPPRA) 1000 MG tablet Take 1 tablet (1,000 mg total) by mouth 2 (two) times daily. 08/29/18    Marcial Pacas, MD  lisinopril (ZESTRIL) 10 MG tablet Take 10 mg by mouth daily.    [provider]  metoprolol tartrate (LOPRESSOR) 25 MG tablet Take 25 mg by mouth 2 (two) times daily.    [provider]  ondansetron (ZOFRAN) 4 MG tablet Take by mouth.    [provider]  pantoprazole (PROTONIX) 40 MG tablet Take 40 mg by mouth daily.    [provider]  polyethylene glycol powder (GLYCOLAX/MIRALAX) 17 GM/SCOOP powder MIX 17 GRAMS (CAPFUL TO FILL LINE) INTO 8 OUNCES OF LIQUID AND DRINK ONCE A DAY 04/01/20   [provider]  QUEtiapine (SEROQUEL XR) 200 MG 24 hr tablet Take 450 mg by mouth at bedtime.    [provider]  QUEtiapine (SEROQUEL) 50 MG tablet Take 50 mg by mouth 2 (two) times daily. 05/23/20   [provider]  sertraline (ZOLOFT) 100 MG tablet Take 2 tablets (200 mg total) by mouth daily. Fordepression Patient taking differently: Take 100 mg by mouth daily. Fordepression 12/07/17   Lindell Spar I, NP  traZODone (DESYREL) 100 MG tablet Take 150 mg by mouth at bedtime. 05/23/20   [provider]    Allergies Fish-derived products, Geodon [ziprasidone hcl], Haldol [haloperidol lactate], Buprenorphine hcl, Compazine [prochlorperazine], Morphine and related, Toradol [ketorolac tromethamine], and Ziprasidone   REVIEW OF SYSTEMS  Negative except as noted here or in the History of Present Illness.   PHYSICAL EXAMINATION  Initial Vital Signs Blood pressure (!) 174/105, pulse (!) 102, temperature 98.8 F (37.1 C), temperature source Oral, resp. rate 18, SpO2 90 %.  Examination General: Well-developed, well-nourished female in no acute distress; appearance consistent with age of record HENT: normocephalic; atraumatic Eyes: pupils equal, round and reactive to light; extraocular muscles intact Neck: supple Heart: regular rate and rhythm Lungs: clear to auscultation bilaterally Abdomen: soft; obese; mid right sided  tenderness; bowel sounds present Extremities: No deformity; full range of motion; pulses normal Neurologic: Awake, alert and oriented; motor function intact in all extremities and symmetric; no facial droop Skin: Warm and dry Psychiatric: Flat affect   RESULTS  Summary of this visit's results, reviewed and interpreted by myself:   EKG Interpretation  Date/Time:    Ventricular Rate:    PR Interval:    QRS Duration:   QT Interval:    QTC Calculation:   R Axis:     Text Interpretation:          Laboratory Studies: Results for orders placed or performed during the hospital encounter of 07/14/20 (from the past 24 hour(s))  CBG monitoring, ED     Status: Abnormal   Collection Time: 07/14/20  9:50 PM  Result Value Ref Range  Glucose-Capillary 432 (H) 70 - 99 mg/dL  Comprehensive metabolic panel     Status: Abnormal   Collection Time: 07/14/20  9:55 PM  Result Value Ref Range   Sodium 131 (L) 135 - 145 mmol/L   Potassium 4.1 3.5 - 5.1 mmol/L   Chloride 97 (L) 98 - 111 mmol/L   CO2 22 22 - 32 mmol/L   Glucose, Bld 411 (H) 70 - 99 mg/dL   BUN 7 6 - 20 mg/dL   Creatinine, Ser 0.43 (L) 0.44 - 1.00 mg/dL   Calcium 9.3 8.9 - 10.3 mg/dL   Total Protein 7.7 6.5 - 8.1 g/dL   Albumin 4.1 3.5 - 5.0 g/dL   AST 33 15 - 41 U/L   ALT 26 0 - 44 U/L   Alkaline Phosphatase 110 38 - 126 U/L   Total Bilirubin 0.5 0.3 - 1.2 mg/dL   GFR, Estimated >60 >60 mL/min   Anion gap 12 5 - 15  CBC with Differential     Status: Abnormal   Collection Time: 07/14/20  9:55 PM  Result Value Ref Range   WBC 4.0 4.0 - 10.5 K/uL   RBC 4.52 3.87 - 5.11 MIL/uL   Hemoglobin 11.1 (L) 12.0 - 15.0 g/dL   HCT 35.9 (L) 36.0 - 46.0 %   MCV 79.4 (L) 80.0 - 100.0 fL   MCH 24.6 (L) 26.0 - 34.0 pg   MCHC 30.9 30.0 - 36.0 g/dL   RDW 15.9 (H) 11.5 - 15.5 %   Platelets 113 (L) 150 - 400 K/uL   nRBC 0.0 0.0 - 0.2 %   Neutrophils Relative % 63 %   Neutro Abs 2.5 1.7 - 7.7 K/uL   Lymphocytes Relative 28 %   Lymphs  Abs 1.1 0.7 - 4.0 K/uL   Monocytes Relative 6 %   Monocytes Absolute 0.2 0.1 - 1.0 K/uL   Eosinophils Relative 3 %   Eosinophils Absolute 0.1 0.0 - 0.5 K/uL   Basophils Relative 0 %   Basophils Absolute 0.0 0.0 - 0.1 K/uL   Immature Granulocytes 0 %   Abs Immature Granulocytes 0.01 0.00 - 0.07 K/uL  Lipase, blood     Status: None   Collection Time: 07/14/20  9:55 PM  Result Value Ref Range   Lipase 32 11 - 51 U/L  I-Stat beta hCG blood, ED     Status: None   Collection Time: 07/14/20 10:45 PM  Result Value Ref Range   I-stat hCG, quantitative <5.0 <5 mIU/mL   Comment 3          Urinalysis, Routine w reflex microscopic Urine, Clean Catch     Status: Abnormal   Collection Time: 07/15/20 12:30 AM  Result Value Ref Range   Color, Urine YELLOW YELLOW   APPearance CLEAR CLEAR   Specific Gravity, Urine 1.029 1.005 - 1.030   pH 5.0 5.0 - 8.0   Glucose, UA >=500 (A) NEGATIVE mg/dL   Hgb urine dipstick SMALL (A) NEGATIVE   Bilirubin Urine NEGATIVE NEGATIVE   Ketones, ur NEGATIVE NEGATIVE mg/dL   Protein, ur NEGATIVE NEGATIVE mg/dL   Nitrite NEGATIVE NEGATIVE   Leukocytes,Ua TRACE (A) NEGATIVE   RBC / HPF 0-5 0 - 5 RBC/hpf   WBC, UA 0-5 0 - 5 WBC/hpf   Bacteria, UA RARE (A) NONE SEEN   Squamous Epithelial / LPF 0-5 0 - 5   Mucus PRESENT   CBG monitoring, ED     Status: Abnormal   Collection Time: 07/15/20  2:29 AM  Result Value Ref Range   Glucose-Capillary 426 (H) 70 - 99 mg/dL   Imaging Studies: CT ABDOMEN PELVIS W CONTRAST  Result Date: 07/15/2020 CLINICAL DATA:  Right lower quadrant pain. Nausea, vomiting. History of cholecystectomy and hernia repair. Ovarian cyst excision. Skin EXAM: CT ABDOMEN AND PELVIS WITH CONTRAST TECHNIQUE: Multidetector CT imaging of the abdomen and pelvis was performed using the standard protocol following bolus administration of intravenous contrast. CONTRAST:  164mL OMNIPAQUE IOHEXOL 300 MG/ML  SOLN COMPARISON:  CT abdomen pelvis 01/02/2018 FINDINGS:  Lower chest: No acute abnormality. Hepatobiliary: The liver is enlarged measuring up to 23 cm. The hepatic parenchyma is diffusely hypodense compared to the splenic parenchyma consistent with fatty infiltration. No focal liver abnormality. Status post cholecystectomy. No biliary dilatation. Pancreas: No focal lesion. Normal pancreatic contour. No surrounding inflammatory changes. No main pancreatic ductal dilatation. Spleen: The spleen is enlarged measuring up to 20 cm. No focal abnormality. Adrenals/Urinary Tract: No adrenal nodule bilaterally. Bilateral kidneys enhance symmetrically. No hydronephrosis. No hydroureter. The urinary bladder is unremarkable. Stomach/Bowel: Stomach is within normal limits. No evidence of bowel wall thickening or dilatation. Appendix appears normal. Vascular/Lymphatic: No significant vascular findings are present. No enlarged abdominal or pelvic lymph nodes. Reproductive: Uterus and bilateral adnexa are unremarkable. Other: No intraperitoneal free fluid. No intraperitoneal free gas. No organized fluid collection. Musculoskeletal: No abdominal wall hernia or abnormality. No suspicious lytic or blastic osseous lesions. No acute displaced fracture. Multilevel degenerative changes of the spine. IMPRESSION: 1. Normal appendix. 2. Hepatic steatosis and similar hepatosplenomegaly. 3. Status post cholecystectomy. Electronically Signed   By: Iven Finn M.D.   On: 07/15/2020 04:42    ED COURSE and MDM  Nursing notes, initial and subsequent vitals signs, including pulse oximetry, reviewed and interpreted by myself.  Vitals:   07/15/20 0300 07/15/20 0330 07/15/20 0345 07/15/20 0400  BP: (!) 181/107 (!) 167/90 (!) 170/97 (!) 163/78  Pulse: 98 93 88 92  Resp: 20   20  Temp:      TempSrc:      SpO2: 98% 95% 97% 97%   Medications  promethazine (PHENERGAN) 25 mg in sodium chloride 0.9 % 50 mL IVPB (0 mg Intravenous Stopped 07/15/20 0353)  sodium chloride (PF) 0.9 % injection (has no  administration in time range)  fentaNYL (SUBLIMAZE) injection 100 mcg (100 mcg Intravenous Given 07/15/20 0304)  sodium chloride 0.9 % bolus 1,000 mL (0 mLs Intravenous Stopped 07/15/20 0455)  insulin aspart (novoLOG) injection 10 Units (10 Units Subcutaneous Given 07/15/20 0305)  diphenhydrAMINE (BENADRYL) injection 25 mg (25 mg Intravenous Given 07/15/20 0352)  iohexol (OMNIPAQUE) 300 MG/ML solution 100 mL (100 mLs Intravenous Contrast Given 07/15/20 0426)   Most recent EKG from 07/07/2020 shows no QT prolongation.  5:01 AM Patient CT scan and laboratory work are unremarkable.  Specifically the patient has no evidence of ureteral stone, appendicitis, bowel obstruction or other acute pathology.  The cause of her pain is unclear but could be radicular given her spinal degenerative changes.  She received a 4-day prescription for oxycodone 2 days ago from her PCP.Marland Kitchen   PROCEDURES  Procedures   ED DIAGNOSES     ICD-10-CM   1. Right upper quadrant abdominal pain  R10.11     2. Hyperglycemia  R73.9          Zoa Dowty, Jenny Reichmann, MD 07/15/20 [phone]

## 2020-09-28 ENCOUNTER — Emergency Department (HOSPITAL_COMMUNITY)
Admission: EM | Admit: 2020-09-28 | Discharge: 2020-09-29 | Disposition: A | Discharge status: Home / Self Care | Payer: Medicare HMO | Attending: Emergency Medicine | Admitting: Emergency Medicine

## 2020-09-28 ENCOUNTER — Other Ambulatory Visit: Payer: Self-pay

## 2020-09-28 ENCOUNTER — Encounter (HOSPITAL_COMMUNITY): Payer: Self-pay

## 2020-09-28 ENCOUNTER — Emergency Department (HOSPITAL_COMMUNITY): Payer: Medicare HMO

## 2020-09-28 DIAGNOSIS — N9489 Other specified conditions associated with female genital organs and menstrual cycle: Secondary | ICD-10-CM | POA: Insufficient documentation

## 2020-09-28 DIAGNOSIS — R1031 Right lower quadrant pain: Secondary | ICD-10-CM | POA: Diagnosis present

## 2020-09-28 DIAGNOSIS — E1110 Type 2 diabetes mellitus with ketoacidosis without coma: Secondary | ICD-10-CM | POA: Diagnosis not present

## 2020-09-28 DIAGNOSIS — R112 Nausea with vomiting, unspecified: Secondary | ICD-10-CM | POA: Insufficient documentation

## 2020-09-28 DIAGNOSIS — Z85028 Personal history of other malignant neoplasm of stomach: Secondary | ICD-10-CM | POA: Diagnosis not present

## 2020-09-28 DIAGNOSIS — J45909 Unspecified asthma, uncomplicated: Secondary | ICD-10-CM | POA: Diagnosis not present

## 2020-09-28 DIAGNOSIS — E1142 Type 2 diabetes mellitus with diabetic polyneuropathy: Secondary | ICD-10-CM | POA: Diagnosis not present

## 2020-09-28 DIAGNOSIS — Z794 Long term (current) use of insulin: Secondary | ICD-10-CM | POA: Insufficient documentation

## 2020-09-28 DIAGNOSIS — Z79899 Other long term (current) drug therapy: Secondary | ICD-10-CM | POA: Insufficient documentation

## 2020-09-28 LAB — COMPREHENSIVE METABOLIC PANEL
ALT: 28 U/L (ref 0–44)
AST: 47 U/L — ABNORMAL HIGH (ref 15–41)
Albumin: 3.9 g/dL (ref 3.5–5.0)
Alkaline Phosphatase: 100 U/L (ref 38–126)
Anion gap: 10 (ref 5–15)
BUN: 8 mg/dL (ref 6–20)
CO2: 22 mmol/L (ref 22–32)
Calcium: 8.5 mg/dL — ABNORMAL LOW (ref 8.9–10.3)
Chloride: 101 mmol/L (ref 98–111)
Creatinine, Ser: 0.45 mg/dL (ref 0.44–1.00)
GFR, Estimated: >60 mL/min (ref >60)
Glucose, Bld: 337 mg/dL — ABNORMAL HIGH (ref 70–99)
Potassium: 4.6 mmol/L (ref 3.5–5.1)
Sodium: 133 mmol/L — ABNORMAL LOW (ref 135–145)
Total Bilirubin: 1.1 mg/dL (ref 0.3–1.2)
Total Protein: 7.2 g/dL (ref 6.5–8.1)

## 2020-09-28 LAB — CBC WITH DIFFERENTIAL/PLATELET
Abs Immature Granulocytes: 0.01 10*3/uL (ref 0.00–0.07)
Basophils Absolute: 0 10*3/uL (ref 0.0–0.1)
Basophils Relative: 1 %
Eosinophils Absolute: 0.1 10*3/uL (ref 0.0–0.5)
Eosinophils Relative: 4 %
HCT: 35 % — ABNORMAL LOW (ref 36.0–46.0)
Hemoglobin: 10.9 g/dL — ABNORMAL LOW (ref 12.0–15.0)
Immature Granulocytes: 0 %
Lymphocytes Relative: 26 %
Lymphs Abs: 0.9 10*3/uL (ref 0.7–4.0)
MCH: 23.9 pg — ABNORMAL LOW (ref 26.0–34.0)
MCHC: 31.1 g/dL (ref 30.0–36.0)
MCV: 76.6 fL — ABNORMAL LOW (ref 80.0–100.0)
Monocytes Absolute: 0.2 10*3/uL (ref 0.1–1.0)
Monocytes Relative: 6 %
Neutro Abs: 2.2 10*3/uL (ref 1.7–7.7)
Neutrophils Relative %: 63 %
Platelets: 105 10*3/uL — ABNORMAL LOW (ref 150–400)
RBC: 4.57 MIL/uL (ref 3.87–5.11)
RDW: 16 % — ABNORMAL HIGH (ref 11.5–15.5)
WBC: 3.5 10*3/uL — ABNORMAL LOW (ref 4.0–10.5)
nRBC: 0 % (ref 0.0–0.2)

## 2020-09-28 LAB — I-STAT BETA HCG BLOOD, ED (MC, WL, AP ONLY): I-stat hCG, quantitative: <5 m[IU]/mL (ref <5)

## 2020-09-28 MED ORDER — ONDANSETRON HCL 4 MG/2ML IJ SOLN
4.0000 mg | Freq: Once | INTRAMUSCULAR | Status: AC
  Administered 2020-09-28: 4 mg via INTRAVENOUS
  Filled 2020-09-28: qty 2

## 2020-09-28 MED ORDER — IOHEXOL 350 MG/ML SOLN
80.0000 mL | Freq: Once | INTRAVENOUS | Status: AC | PRN
  Administered 2020-09-28: 80 mL via INTRAVENOUS

## 2020-09-28 MED ORDER — FENTANYL CITRATE PF 50 MCG/ML IJ SOSY
50.0000 ug | PREFILLED_SYRINGE | Freq: Once | INTRAMUSCULAR | Status: AC
  Administered 2020-09-28: 50 ug via INTRAVENOUS
  Filled 2020-09-28: qty 1

## 2020-09-28 NOTE — ED Provider Notes (Signed)
[facility] DEPT Provider Note   CSN: [id number] Arrival date & time: 09/28/20  2101     History Chief Complaint  Patient presents with   Abdominal Pain    Tina Saunders is a 35 y.o. female.  The history is provided by the patient and medical records.  Abdominal Pain Associated symptoms: nausea and vomiting    35 year old female with history of anxiety, asthma, bipolar disorder, diabetes, hypertension, PCOS, schizophrenia, presenting to the ED with right lower abdominal pain.  States this began yesterday and has been worsening since onset.  States it feels like a lot of pressure in her lower abdomen.  Ports low-grade fever yesterday with nausea and poor oral intake.  States she does not have much of an appetite.  She has had prior cholecystectomy.  Was taking Phenergan and Tylenol today without relief.  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   Functional neurological symptom disorder with attacks or seizures     Adjustment disorder with mixed disturbance of emotions and conduct    QT prolongation    Suicide attempt ([place])    Overdose of antidepressant, intentional self-harm, initial encounter ([place])    Overdose 07/26/2017   Suicide attempt by other psychotropic drug overdose ([place])    DKA (diabetic ketoacidoses) 03/14/2017   Other constipation    Seizures ([place]) 12/11/2016   Type 2 diabetes mellitus with diabetic polyneuropathy, with long-term current use of insulin ([place]) 12/11/2016   Schizoaffective disorder, bipolar type ([place]) 11/02/2016   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   Suicidal ideation    Hepatic steatosis 02/06/2014   Splenomegaly 02/06/2014   Vision loss of right eye 04/15/2013   HTN (hypertension) 04/15/2013   Posttraumatic stress disorder 12/07/2011   PSVT (paroxysmal supraventricular tachycardia) ([place]) 08/26/2011   ADHD 09/23/2007   EPIGASTRIC PAIN 09/23/2007   ADHD 09/23/2007   Obesity, Class II, BMI 35-39.9 07/30/2007   Schizoaffective disorder, depressive type ([place]) 07/30/2007   SLEEP DISORDER 07/30/2007   METRORRHAGIA 06/13/2006   DISORDER, MENSTRUAL NEC 06/13/2006   Disequilibrium 06/13/2006   POLYCYSTIC OVARIAN DISEASE 04/25/2006   AMENORRHEA, SECONDARY 04/20/2006   ACNE, MILD 04/20/2006    Past Surgical History:  Procedure Laterality Date   CHOLECYSTECTOMY     COLONOSCOPY WITH PROPOFOL N/A 03/08/2017   Procedure: COLONOSCOPY WITH PROPOFOL;  Surgeon: Milus Banister, MD;  Location: [facility];  Service: Endoscopy;  Laterality: N/A;   HERNIA REPAIR  Ovarian Cyst Excision     VARICOSE VEIN SURGERY       OB History     Gravida  0   Para      Term      Preterm      AB      Living         SAB      IAB      Ectopic      Multiple      Live Births              Family History  Problem Relation Age of Onset   Coronary artery disease Maternal Grandmother    Diabetes type II Maternal Grandmother    Cancer Maternal  Grandmother    Hypertension Mother    Hypertension Father     Social History   Tobacco Use   Smoking status: Never   Smokeless tobacco: Never  Vaping Use   Vaping Use: Never used  Substance Use Topics   Alcohol use: No   Drug use: No    Home Medications Prior to Admission medications   Medication Sig Start Date End Date Taking? Authorizing Provider  baclofen (LIORESAL) 10 MG tablet TAKE TWO TABLETS BY MOUTH THREE TIMES A DAY. (WHITE ROUND TABLET WITH [id number]) 05/27/20   [provider]  bisacodyl (DULCOLAX) 5 MG EC tablet Take 2 tablets (10 mg total) by mouth daily as needed for moderate constipation. (May buy from over the counter): For constipation 12/06/17   Lindell Spar I, NP  busPIRone (BUSPAR) 15 MG tablet Take 15 mg by mouth 3 (three) times daily. 05/23/20   [provider]  chlorproMAZINE (THORAZINE) 25 MG tablet Take 25 mg by mouth 3 (three) times daily. 01/27/19   [provider]  Docusate Sodium (DSS) 100 MG CAPS Take 100 mg by mouth as needed (constipation). 08/15/18   [provider]  ergocalciferol (VITAMIN D2) 1.25 MG (50000 UT) capsule Take 1 capsule by mouth once a week. 06/07/20   [provider]  famotidine (PEPCID) 40 MG tablet Take by mouth.    [provider]  gabapentin (NEURONTIN) 600 MG tablet Take 600 mg by mouth 3 (three) times daily. 06/07/20   [provider]  insulin aspart (NOVOLOG FLEXPEN) 100 UNIT/ML FlexPen Inject into the skin. 06/07/20   [provider]  insulin glargine (LANTUS SOLOSTAR) 100 UNIT/ML Solostar Pen Inject into the skin. 06/07/20   [provider]  Insulin Syringe-Needle U-100 25G X 1" 1 ML MISC 1 Syringe by Does not apply route 4 (four) times daily -  before meals and at bedtime. For blood sugar checks 12/06/17   Lindell Spar I, NP  lamoTRIgine (LAMICTAL) 100 MG tablet take 1 tablet by oral route in the morning and 1.5 tablet at night 04/12/20   [provider]  LEVEMIR FLEXTOUCH 100 UNIT/ML FlexPen Inject into the skin. 04/23/20   [provider]  levETIRAcetam (KEPPRA) 1000 MG tablet Take 1 tablet (1,000 mg total) by mouth 2 (two) times daily. 08/29/18   Marcial Pacas, MD  lisinopril (ZESTRIL) 10 MG tablet Take 10 mg by mouth daily.    [provider]  metoprolol tartrate (LOPRESSOR) 25 MG tablet Take 25 mg by mouth 2 (two) times daily.    [provider]  ondansetron (ZOFRAN) 4 MG tablet Take by mouth.    [provider]  pantoprazole (PROTONIX) 40 MG tablet Take 40 mg by mouth daily.    [provider]  polyethylene glycol powder (GLYCOLAX/MIRALAX) 17 GM/SCOOP powder MIX 17 GRAMS (CAPFUL TO FILL LINE) INTO 8 OUNCES OF LIQUID AND DRINK ONCE A DAY 04/01/20   [provider]  promethazine (PHENERGAN) 25 MG tablet Take 1 tablet (25 mg total) by mouth every 6 (six) hours as needed for nausea or vomiting. 07/15/20   Molpus, John, MD  QUEtiapine (SEROQUEL XR) 200 MG 24 hr tablet Take 450 mg by mouth at bedtime.    [provider]  QUEtiapine (SEROQUEL) 50 MG tablet Take 50 mg by mouth 2 (two) times daily. 05/23/20   [provider]  sertraline (ZOLOFT) 100 MG tablet Take 2 tablets (200 mg total) by mouth daily. Fordepression Patient taking differently: Take 100 mg by mouth daily. Fordepression 12/07/17   Lindell Spar I, NP  traZODone (DESYREL) 100 MG tablet Take 150 mg by mouth at bedtime. 05/23/20   [provider]    Allergies    Fish-derived products, Geodon [ziprasidone hcl], Haldol [haloperidol lactate], Buprenorphine hcl, Compazine [prochlorperazine], Morphine and related, Toradol [ketorolac tromethamine], and Ziprasidone  Review of Systems   Review of Systems  Gastrointestinal:  Positive for abdominal pain, nausea and vomiting.  All other systems reviewed and are negative.  Physical Exam Updated Vital Signs BP (!) 143/87   Pulse 90   Temp 98.8 F (37.1 C) (Oral)   Resp  20   Ht '5\' 4"'$  (1.626 m)   Wt 108.9 kg   SpO2 96%   BMI 41.20 kg/m   Physical Exam Vitals and nursing note reviewed.  Constitutional:      Appearance: She is well-developed.  HENT:     Head: Normocephalic and atraumatic.  Eyes:     Conjunctiva/sclera: Conjunctivae normal.     Pupils: Pupils are equal, round, and reactive to light.  Cardiovascular:     Rate and Rhythm: Normal rate and regular rhythm.     Heart sounds: Normal heart sounds.  Pulmonary:     Effort: Pulmonary effort is normal.     Breath sounds: Normal breath sounds.  Abdominal:     General: Bowel sounds are normal.     Palpations: Abdomen is soft.     Tenderness: There is abdominal tenderness in the right lower quadrant.  Musculoskeletal:        General: Normal range of motion.     Cervical back: Normal range of motion.  Skin:    General: Skin is warm and dry.  Neurological:     Mental Status: She is alert and oriented to person, place, and time.    ED Results / Procedures / Treatments   Labs (all labs ordered are listed, but only abnormal results are displayed) Labs Reviewed  CBC WITH DIFFERENTIAL/PLATELET - Abnormal; Notable for the following components:      Result Value   WBC 3.5 (*)    Hemoglobin 10.9 (*)    HCT 35.0 (*)    MCV 76.6 (*)    MCH 23.9 (*)    RDW 16.0 (*)    Platelets 105 (*)    All other components within normal limits  COMPREHENSIVE METABOLIC PANEL - Abnormal; Notable for the following components:   Sodium 133 (*)    Glucose, Bld 337 (*)    Calcium 8.5 (*)    AST 47 (*)    All other components within normal limits  URINALYSIS, ROUTINE W REFLEX MICROSCOPIC - Abnormal; Notable for the following components:   Glucose, UA 500 (*)    All  other components within normal limits  I-STAT BETA HCG BLOOD, ED ([facility], [facility], [facility] ONLY)    EKG None  Radiology CT ABDOMEN PELVIS W CONTRAST  Result Date: 09/29/2020 CLINICAL DATA:  Right lower quadrant pain EXAM: CT ABDOMEN AND PELVIS WITH  CONTRAST TECHNIQUE: Multidetector CT imaging of the abdomen and pelvis was performed using the standard protocol following bolus administration of intravenous contrast. CONTRAST:  27m OMNIPAQUE IOHEXOL 350 MG/ML SOLN COMPARISON:  07/15/2020 FINDINGS: Lower chest: No acute abnormality Hepatobiliary: Prior cholecystectomy. Diffuse fatty infiltration of the liver. No focal hepatic abnormality. Liver appears enlarged with a craniocaudal length of 20 T2 0.5 cm. Pancreas: No focal abnormality or ductal dilatation. Spleen: Splenomegaly with a craniocaudal length of 21.5 cm. No focal abnormality. Adrenals/Urinary Tract: No adrenal abnormality. No focal renal abnormality. No stones or hydronephrosis. Urinary bladder is unremarkable. Stomach/Bowel: Normal appendix. Stomach, large and small bowel grossly unremarkable. Vascular/Lymphatic: No evidence of aneurysm or adenopathy. Reproductive: Uterus and adnexa unremarkable.  No mass. Other: No free fluid or free air. Musculoskeletal: No acute bony abnormality. IMPRESSION: Hepatosplenomegaly.  This is stable since prior study. Hepatic steatosis. Normal appendix. Electronically Signed   By: KRolm BaptiseM.D.   On: 09/29/2020 00:05    Procedures Procedures   Medications Ordered in ED Medications  fentaNYL (SUBLIMAZE) injection 50 mcg (50 mcg Intravenous Given 09/28/20 2336)  ondansetron (ZOFRAN) injection 4 mg (4 mg Intravenous Given 09/28/20 2335)  iohexol (OMNIPAQUE) 350 MG/ML injection 80 mL (80 mLs Intravenous Contrast Given 09/28/20 2347)    ED Course  I have reviewed the triage vital signs and the nursing notes.  Pertinent labs & imaging results that were available during my care of the patient were reviewed by me and considered in my medical decision making (see chart for details).    MDM Rules/Calculators/A&P                           35year old female here with right lower quadrant abdominal pain.  It does appear she has been seen for this quite a few  times based on chart review.  She is afebrile and nontoxic in appearance here.  She is mildly tender in the right lower abdomen.  Labs are overall reassuring.  UA without signs of infection.  CT is negative for appendicitis, does have stable hepatosplenomegaly that is largely unchanged since prior study.  Feel she is stable for discharge home to follow-up with PCP.  Return here for any new or acute changes.  Final Clinical Impression(s) / ED Diagnoses Final diagnoses:  RLQ abdominal pain    Rx / DC Orders ED Discharge Orders     None        SLarene Pickett PA-C 09/29/20 [place] MD 09/29/20 [phone]

## 2020-09-28 NOTE — ED Triage Notes (Signed)
Pt BIB EMS. Pt complains of RLQ x 1 day. Pt states that she has diverticulitis.

## 2020-09-29 DIAGNOSIS — R1031 Right lower quadrant pain: Secondary | ICD-10-CM | POA: Diagnosis not present

## 2020-09-29 LAB — URINALYSIS, ROUTINE W REFLEX MICROSCOPIC
Bilirubin Urine: NEGATIVE
Glucose, UA: 500 mg/dL — AB
Hgb urine dipstick: NEGATIVE
Ketones, ur: NEGATIVE mg/dL
Leukocytes,Ua: NEGATIVE
Nitrite: NEGATIVE
Protein, ur: NEGATIVE mg/dL
Specific Gravity, Urine: 1.01 (ref 1.005–1.030)
pH: 6.5 (ref 5.0–8.0)

## 2020-09-29 NOTE — Discharge Instructions (Addendum)
Labs and CT today were normal. Follow-up with your primary care doctor. Return here for new concerns.

## 2020-12-30 ENCOUNTER — Other Ambulatory Visit: Payer: Self-pay

## 2020-12-30 ENCOUNTER — Emergency Department (HOSPITAL_COMMUNITY): Payer: Medicare HMO

## 2020-12-30 ENCOUNTER — Emergency Department (HOSPITAL_COMMUNITY)
Admission: EM | Admit: 2020-12-30 | Discharge: 2020-12-30 | Disposition: A | Discharge status: Home / Self Care | Payer: Medicare HMO | Attending: Emergency Medicine | Admitting: Emergency Medicine

## 2020-12-30 ENCOUNTER — Encounter (HOSPITAL_COMMUNITY): Payer: Self-pay

## 2020-12-30 DIAGNOSIS — Z9049 Acquired absence of other specified parts of digestive tract: Secondary | ICD-10-CM | POA: Diagnosis not present

## 2020-12-30 DIAGNOSIS — E119 Type 2 diabetes mellitus without complications: Secondary | ICD-10-CM | POA: Diagnosis not present

## 2020-12-30 DIAGNOSIS — J45909 Unspecified asthma, uncomplicated: Secondary | ICD-10-CM | POA: Diagnosis not present

## 2020-12-30 DIAGNOSIS — Z85028 Personal history of other malignant neoplasm of stomach: Secondary | ICD-10-CM | POA: Diagnosis not present

## 2020-12-30 DIAGNOSIS — N9489 Other specified conditions associated with female genital organs and menstrual cycle: Secondary | ICD-10-CM | POA: Diagnosis not present

## 2020-12-30 DIAGNOSIS — R1031 Right lower quadrant pain: Secondary | ICD-10-CM | POA: Insufficient documentation

## 2020-12-30 DIAGNOSIS — Z794 Long term (current) use of insulin: Secondary | ICD-10-CM | POA: Insufficient documentation

## 2020-12-30 DIAGNOSIS — Z79899 Other long term (current) drug therapy: Secondary | ICD-10-CM | POA: Insufficient documentation

## 2020-12-30 DIAGNOSIS — I10 Essential (primary) hypertension: Secondary | ICD-10-CM | POA: Diagnosis not present

## 2020-12-30 LAB — CBC WITH DIFFERENTIAL/PLATELET
Abs Immature Granulocytes: 0.01 10*3/uL (ref 0.00–0.07)
Basophils Absolute: 0 10*3/uL (ref 0.0–0.1)
Basophils Relative: 1 %
Eosinophils Absolute: 0.1 10*3/uL (ref 0.0–0.5)
Eosinophils Relative: 3 %
HCT: 44.1 % (ref 36.0–46.0)
Hemoglobin: 14.3 g/dL (ref 12.0–15.0)
Immature Granulocytes: 0 %
Lymphocytes Relative: 31 %
Lymphs Abs: 1.2 10*3/uL (ref 0.7–4.0)
MCH: 26.8 pg (ref 26.0–34.0)
MCHC: 32.4 g/dL (ref 30.0–36.0)
MCV: 82.6 fL (ref 80.0–100.0)
Monocytes Absolute: 0.2 10*3/uL (ref 0.1–1.0)
Monocytes Relative: 4 %
Neutro Abs: 2.4 10*3/uL (ref 1.7–7.7)
Neutrophils Relative %: 61 %
Platelets: 89 10*3/uL — ABNORMAL LOW (ref 150–400)
RBC: 5.34 MIL/uL — ABNORMAL HIGH (ref 3.87–5.11)
RDW: 15.8 % — ABNORMAL HIGH (ref 11.5–15.5)
WBC: 3.9 10*3/uL — ABNORMAL LOW (ref 4.0–10.5)
nRBC: 0 % (ref 0.0–0.2)

## 2020-12-30 LAB — URINALYSIS, ROUTINE W REFLEX MICROSCOPIC
Bilirubin Urine: NEGATIVE
Glucose, UA: >=500 mg/dL — AB
Ketones, ur: NEGATIVE mg/dL
Leukocytes,Ua: NEGATIVE
Nitrite: NEGATIVE
Protein, ur: NEGATIVE mg/dL
Specific Gravity, Urine: 1.03 (ref 1.005–1.030)
pH: 6 (ref 5.0–8.0)

## 2020-12-30 LAB — COMPREHENSIVE METABOLIC PANEL
ALT: 34 U/L (ref 0–44)
AST: 44 U/L — ABNORMAL HIGH (ref 15–41)
Albumin: 4.2 g/dL (ref 3.5–5.0)
Alkaline Phosphatase: 97 U/L (ref 38–126)
Anion gap: 9 (ref 5–15)
BUN: 6 mg/dL (ref 6–20)
CO2: 22 mmol/L (ref 22–32)
Calcium: 8.6 mg/dL — ABNORMAL LOW (ref 8.9–10.3)
Chloride: 102 mmol/L (ref 98–111)
Creatinine, Ser: 0.47 mg/dL (ref 0.44–1.00)
GFR, Estimated: >60 mL/min (ref >60)
Glucose, Bld: 380 mg/dL — ABNORMAL HIGH (ref 70–99)
Potassium: 4.1 mmol/L (ref 3.5–5.1)
Sodium: 133 mmol/L — ABNORMAL LOW (ref 135–145)
Total Bilirubin: 0.6 mg/dL (ref 0.3–1.2)
Total Protein: 7.8 g/dL (ref 6.5–8.1)

## 2020-12-30 LAB — RAPID URINE DRUG SCREEN, HOSP PERFORMED
Amphetamines: NOT DETECTED
Barbiturates: NOT DETECTED
Benzodiazepines: NOT DETECTED
Cocaine: NOT DETECTED
Opiates: NOT DETECTED
Tetrahydrocannabinol: NOT DETECTED

## 2020-12-30 LAB — HCG, QUANTITATIVE, PREGNANCY: hCG, Beta Chain, Quant, S: 1 m[IU]/mL (ref <5)

## 2020-12-30 LAB — LIPASE, BLOOD: Lipase: 33 U/L (ref 11–51)

## 2020-12-30 MED ORDER — SODIUM CHLORIDE 0.9 % IV BOLUS
1000.0000 mL | Freq: Once | INTRAVENOUS | Status: AC
  Administered 2020-12-30: 1000 mL via INTRAVENOUS

## 2020-12-30 MED ORDER — ONDANSETRON HCL 4 MG/2ML IJ SOLN
4.0000 mg | Freq: Once | INTRAMUSCULAR | Status: AC
  Administered 2020-12-30: 4 mg via INTRAVENOUS
  Filled 2020-12-30: qty 2

## 2020-12-30 MED ORDER — DIPHENHYDRAMINE HCL 50 MG/ML IJ SOLN
25.0000 mg | Freq: Once | INTRAMUSCULAR | Status: AC
  Administered 2020-12-30: 25 mg via INTRAVENOUS
  Filled 2020-12-30: qty 1

## 2020-12-30 MED ORDER — INSULIN ASPART 100 UNIT/ML IJ SOLN
10.0000 [IU] | Freq: Once | INTRAMUSCULAR | Status: AC
  Administered 2020-12-30: 10 [IU] via SUBCUTANEOUS
  Filled 2020-12-30: qty 0.1

## 2020-12-30 MED ORDER — FENTANYL CITRATE PF 50 MCG/ML IJ SOSY
100.0000 ug | PREFILLED_SYRINGE | Freq: Once | INTRAMUSCULAR | Status: AC
  Administered 2020-12-30: 100 ug via INTRAVENOUS
  Filled 2020-12-30: qty 2

## 2020-12-30 NOTE — ED Triage Notes (Signed)
Pt reports with RLQ abdominal pain since yesterday. Pt states that it started in her umbilicus area and extends to her right side.

## 2020-12-30 NOTE — ED Provider Notes (Signed)
[facility] DEPT Provider Note: Georgena Spurling, MD, FACEP  CSN: [id number] MRN: [id number] ARRIVAL: 12/30/20 at [place]: [place]  Abdominal Pain   HISTORY OF PRESENT ILLNESS  12/30/20 4:48 AM Tina Saunders is a 35 y.o. female who has had dozens of CTs of the abdomen and pelvis along with pelvic ultrasounds for abdominal pain over the past several years.  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  She is here with right lower quadrant pain.  She states the pain began yesterday periumbilically and is migrated to the right lower quadrant.  She states it is sharp and has waxed and waned.  She rates the pain is a 6 currently.  It is worse with movement or palpation.  She has had associated nausea but no vomiting.   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[place])    Schizophrenia ([place])    Seizures ([place])     Past Surgical History:  Procedure Laterality Date   CHOLECYSTECTOMY     COLONOSCOPY WITH PROPOFOL N/A 03/08/2017   Procedure: COLONOSCOPY WITH PROPOFOL;  Surgeon: Milus Banister, MD;  Location: [facility];  Service: Endoscopy;  Laterality: N/A;   HERNIA REPAIR     Ovarian Cyst Excision     VARICOSE VEIN SURGERY      Family History  Problem Relation Age of Onset   Coronary artery disease Maternal Grandmother    Diabetes type II Maternal Grandmother    Cancer Maternal Grandmother    Hypertension Mother    Hypertension Father     Social History   Tobacco Use   Smoking status: Never   Smokeless tobacco: Never  Vaping Use   Vaping  Use: Never used  Substance Use Topics   Alcohol use: No   Drug use: No    Prior to Admission medications   Medication Sig Start Date End Date Taking? Authorizing Provider  baclofen (LIORESAL) 10 MG tablet Take 20 mg by mouth 3 (three) times daily. WHITE ROUND TABLET WITH [id number]) 05/27/20  Yes [provider]  busPIRone (BUSPAR) 15 MG tablet Take 15 mg by mouth 3 (three) times daily. 05/23/20  Yes [provider]  chlorproMAZINE (THORAZINE) 25 MG tablet Take 25 mg by mouth 3 (three) times daily. 01/27/19  Yes [provider]  Docusate Sodium (DSS) 100 MG CAPS Take 200 mg by mouth at bedtime. 08/15/18  Yes [provider]  ergocalciferol (VITAMIN D2) 1.25 MG (50000 UT) capsule Take 50,000 Units by mouth once a week. Tuesday   Yes [provider]  gabapentin (NEURONTIN) 600 MG tablet Take 600 mg by mouth 3 (three) times daily. 06/07/20  Yes [provider]  insulin aspart (NOVOLOG FLEXPEN) 100 UNIT/ML FlexPen Inject 60 Units into the skin at bedtime. 06/07/20  Yes [provider]  insulin glargine (LANTUS SOLOSTAR) 100 UNIT/ML Solostar Pen Inject 100 Units into the skin at bedtime. 06/07/20  Yes [provider]  pantoprazole (PROTONIX) 40 MG tablet Take 40 mg by  mouth daily.   Yes [provider]  polyethylene glycol powder (GLYCOLAX/MIRALAX) 17 GM/SCOOP powder Mix 17g (capful to full line) into 8 ounces of liquid and drink once a day as needed for constipation 04/01/20  Yes [provider]  QUEtiapine (SEROQUEL) 50 MG tablet Take 50 mg by mouth 2 (two) times daily. 05/23/20  Yes [provider]  traZODone (DESYREL) 100 MG tablet Take 150 mg by mouth at bedtime. 05/23/20  Yes [provider]  famotidine (PEPCID) 40 MG tablet Take by mouth.    [provider]  Insulin Syringe-Needle U-100 25G X 1" 1 ML MISC 1 Syringe by Does not apply route 4 (four) times daily -  before meals and at bedtime. For  blood sugar checks 12/06/17   Lindell Spar I, NP  lamoTRIgine (LAMICTAL) 100 MG tablet take 1 tablet by oral route in the morning and 1.5 tablet at night 04/12/20   [provider]  levETIRAcetam (KEPPRA) 1000 MG tablet Take 1 tablet (1,000 mg total) by mouth 2 (two) times daily. 08/29/18   Marcial Pacas, MD  lisinopril (ZESTRIL) 10 MG tablet Take 10 mg by mouth daily.    [provider]  metoprolol tartrate (LOPRESSOR) 25 MG tablet Take 25 mg by mouth 2 (two) times daily.    [provider]  QUEtiapine (SEROQUEL XR) 200 MG 24 hr tablet Take 450 mg by mouth at bedtime.    [provider]  sertraline (ZOLOFT) 100 MG tablet Take 2 tablets (200 mg total) by mouth daily. Fordepression Patient taking differently: Take 100 mg by mouth daily. Fordepression 12/07/17   Lindell Spar I, NP    Allergies Fish-derived products, Geodon [ziprasidone hcl], Haldol [haloperidol lactate], Buprenorphine hcl, Compazine [prochlorperazine], Morphine and related, Toradol [ketorolac tromethamine], and Fentanyl   REVIEW OF SYSTEMS  Negative except as noted here or in the History of Present Illness.   PHYSICAL EXAMINATION  Initial Vital Signs Blood pressure (!) 153/100, pulse 83, temperature 98.2 F (36.8 C), temperature source Oral, resp. rate 18, SpO2 97 %.  Examination General: Well-developed, high BMI female in no acute distress; appearance consistent with age of record HENT: normocephalic; atraumatic Eyes: pupils equal, round and reactive to light; extraocular muscles intact Neck: supple Heart: regular rate and rhythm Lungs: clear to auscultation bilaterally Abdomen: soft; nondistended; right lower quadrant tenderness; bowel sounds present Extremities: No deformity; full range of motion; pulses normal Neurologic: Awake, alert and oriented; motor function intact in all extremities and symmetric; no facial droop Skin: Warm and dry Psychiatric: Normal mood and  affect   RESULTS  Summary of this visit's results, reviewed and interpreted by myself:   EKG Interpretation  Date/Time:    Ventricular Rate:    PR Interval:    QRS Duration:   QT Interval:    QTC Calculation:   R Axis:     Text Interpretation:         Laboratory Studies: Results for orders placed or performed during the hospital encounter of 12/30/20 (from the past 24 hour(s))  CBC with Differential     Status: Abnormal   Collection Time: 12/30/20  2:16 AM  Result Value Ref Range   WBC 3.9 (L) 4.0 - 10.5 K/uL   RBC 5.34 (H) 3.87 - 5.11 MIL/uL   Hemoglobin 14.3 12.0 - 15.0 g/dL   HCT 44.1 36.0 - 46.0 %   MCV 82.6 80.0 - 100.0 fL   MCH 26.8 26.0 - 34.0 pg   MCHC 32.4 30.0 -  36.0 g/dL   RDW 15.8 (H) 11.5 - 15.5 %   Platelets 89 (L) 150 - 400 K/uL   nRBC 0.0 0.0 - 0.2 %   Neutrophils Relative % 61 %   Neutro Abs 2.4 1.7 - 7.7 K/uL   Lymphocytes Relative 31 %   Lymphs Abs 1.2 0.7 - 4.0 K/uL   Monocytes Relative 4 %   Monocytes Absolute 0.2 0.1 - 1.0 K/uL   Eosinophils Relative 3 %   Eosinophils Absolute 0.1 0.0 - 0.5 K/uL   Basophils Relative 1 %   Basophils Absolute 0.0 0.0 - 0.1 K/uL   Immature Granulocytes 0 %   Abs Immature Granulocytes 0.01 0.00 - 0.07 K/uL   Ovalocytes PRESENT   Comprehensive metabolic panel     Status: Abnormal   Collection Time: 12/30/20  2:16 AM  Result Value Ref Range   Sodium 133 (L) 135 - 145 mmol/L   Potassium 4.1 3.5 - 5.1 mmol/L   Chloride 102 98 - 111 mmol/L   CO2 22 22 - 32 mmol/L   Glucose, Bld 380 (H) 70 - 99 mg/dL   BUN 6 6 - 20 mg/dL   Creatinine, Ser 0.47 0.44 - 1.00 mg/dL   Calcium 8.6 (L) 8.9 - 10.3 mg/dL   Total Protein 7.8 6.5 - 8.1 g/dL   Albumin 4.2 3.5 - 5.0 g/dL   AST 44 (H) 15 - 41 U/L   ALT 34 0 - 44 U/L   Alkaline Phosphatase 97 38 - 126 U/L   Total Bilirubin 0.6 0.3 - 1.2 mg/dL   GFR, Estimated >60 >60 mL/min   Anion gap 9 5 - 15  Lipase, blood     Status: None   Collection Time: 12/30/20  2:16 AM   Result Value Ref Range   Lipase 33 11 - 51 U/L  Urinalysis, Routine w reflex microscopic Urine, Clean Catch     Status: Abnormal   Collection Time: 12/30/20  2:16 AM  Result Value Ref Range   Color, Urine YELLOW YELLOW   APPearance CLEAR CLEAR   Specific Gravity, Urine 1.030 1.005 - 1.030   pH 6.0 5.0 - 8.0   Glucose, UA >=500 (A) NEGATIVE mg/dL   Hgb urine dipstick SMALL (A) NEGATIVE   Bilirubin Urine NEGATIVE NEGATIVE   Ketones, ur NEGATIVE NEGATIVE mg/dL   Protein, ur NEGATIVE NEGATIVE mg/dL   Nitrite NEGATIVE NEGATIVE   Leukocytes,Ua NEGATIVE NEGATIVE   RBC / HPF 0-5 0 - 5 RBC/hpf   WBC, UA 0-5 0 - 5 WBC/hpf   Bacteria, UA RARE (A) NONE SEEN   Squamous Epithelial / LPF 0-5 0 - 5  hCG, quantitative, pregnancy     Status: None   Collection Time: 12/30/20  2:21 AM  Result Value Ref Range   hCG, Beta Chain, Quant, S 1 <5 mIU/mL   Imaging Studies: No results found.  ED COURSE and MDM  Nursing notes, initial and subsequent vitals signs, including pulse oximetry, reviewed and interpreted by myself.  Vitals:   12/30/20 0345 12/30/20 0430 12/30/20 0500 12/30/20 0530  BP: (!) 153/100 (!) 181/88 (!) 181/91 (!) 197/98  Pulse: 83 92 87 88  Resp: 18 14 17 13   Temp:      TempSrc:      SpO2: 97% 96% 99% 95%   Medications  insulin aspart (novoLOG) injection 10 Units (has no administration in time range)  sodium chloride 0.9 % bolus 1,000 mL (1,000 mLs Intravenous New Bag/Given 12/30/20 0508)  ondansetron (ZOFRAN)  injection 4 mg (4 mg Intravenous Given 12/30/20 0506)  fentaNYL (SUBLIMAZE) injection 100 mcg (100 mcg Intravenous Given 12/30/20 0512)  diphenhydrAMINE (BENADRYL) injection 25 mg (25 mg Intravenous Given 12/30/20 0523)   6:30 AM Despite the patient's innumerable CT scans and other diagnostic imaging studies in the past her story is concerning for acute appendicitis though she has no leukocytosis.  The patient understands that she has had a significant amount of radiation in  the past and would prefer to be discharged home for self observation and return in 12 to 24 hours if symptoms worsen (such as worsening pain, fever, chills, etc.).  PROCEDURES  Procedures   ED DIAGNOSES     ICD-10-CM   1. Abdominal pain, RLQ  R10.31 CANCELED: US APPENDIX (ABDOMEN LIMITED)    CANCELED: US APPENDIX (ABDOMEN LIMITED)         Tzipora Mcinroy, MD 12/30/20 [phone]

## 2020-12-30 NOTE — ED Notes (Signed)
Pt have been made aware of urine sample. Will info staff when able to give

## 2021-05-23 DEATH — deceased

## 2021-06-09 ENCOUNTER — Ambulatory Visit: Payer: Medicare HMO | Admitting: Oncology

## 2021-06-09 ENCOUNTER — Other Ambulatory Visit: Payer: Medicare HMO
